# Patient Record
Sex: Female | Born: 1950
Health system: Southern US, Community
[De-identification: ages and names within clinical notes are randomized; demographics above are authoritative.]

## PROBLEM LIST (undated history)

## (undated) DIAGNOSIS — Z9221 Personal history of antineoplastic chemotherapy: Secondary | ICD-10-CM

## (undated) DIAGNOSIS — M1990 Unspecified osteoarthritis, unspecified site: Secondary | ICD-10-CM

## (undated) DIAGNOSIS — R5383 Other fatigue: Principal | ICD-10-CM

## (undated) DIAGNOSIS — R0602 Shortness of breath: Secondary | ICD-10-CM

## (undated) DIAGNOSIS — F329 Major depressive disorder, single episode, unspecified: Secondary | ICD-10-CM

## (undated) DIAGNOSIS — N959 Unspecified menopausal and perimenopausal disorder: Secondary | ICD-10-CM

## (undated) DIAGNOSIS — J90 Pleural effusion, not elsewhere classified: Secondary | ICD-10-CM

## (undated) DIAGNOSIS — H01006 Unspecified blepharitis left eye, unspecified eyelid: Secondary | ICD-10-CM

## (undated) DIAGNOSIS — I10 Essential (primary) hypertension: Secondary | ICD-10-CM

## (undated) DIAGNOSIS — H01003 Unspecified blepharitis right eye, unspecified eyelid: Secondary | ICD-10-CM

## (undated) DIAGNOSIS — K219 Gastro-esophageal reflux disease without esophagitis: Secondary | ICD-10-CM

## (undated) DIAGNOSIS — C7931 Secondary malignant neoplasm of brain: Secondary | ICD-10-CM

## (undated) DIAGNOSIS — E7800 Pure hypercholesterolemia, unspecified: Secondary | ICD-10-CM

## (undated) DIAGNOSIS — Z820 Family history of epilepsy and other diseases of the nervous system: Secondary | ICD-10-CM

## (undated) DIAGNOSIS — H538 Other visual disturbances: Secondary | ICD-10-CM

## (undated) DIAGNOSIS — D6481 Anemia due to antineoplastic chemotherapy: Secondary | ICD-10-CM

## (undated) DIAGNOSIS — Z842 Family history of other diseases of the genitourinary system: Secondary | ICD-10-CM

## (undated) DIAGNOSIS — Z923 Personal history of irradiation: Secondary | ICD-10-CM

## (undated) DIAGNOSIS — J9621 Acute and chronic respiratory failure with hypoxia: Secondary | ICD-10-CM

## (undated) DIAGNOSIS — E876 Hypokalemia: Secondary | ICD-10-CM

## (undated) DIAGNOSIS — Z780 Asymptomatic menopausal state: Secondary | ICD-10-CM

## (undated) DIAGNOSIS — R911 Solitary pulmonary nodule: Secondary | ICD-10-CM

## (undated) DIAGNOSIS — E440 Moderate protein-calorie malnutrition: Secondary | ICD-10-CM

## (undated) DIAGNOSIS — R0600 Dyspnea, unspecified: Secondary | ICD-10-CM

## (undated) DIAGNOSIS — Z5111 Encounter for antineoplastic chemotherapy: Secondary | ICD-10-CM

## (undated) DIAGNOSIS — I671 Cerebral aneurysm, nonruptured: Secondary | ICD-10-CM

## (undated) DIAGNOSIS — J910 Malignant pleural effusion: Secondary | ICD-10-CM

## (undated) DIAGNOSIS — I517 Cardiomegaly: Secondary | ICD-10-CM

## (undated) DIAGNOSIS — C189 Malignant neoplasm of colon, unspecified: Secondary | ICD-10-CM

## (undated) DIAGNOSIS — F32A Depression, unspecified: Secondary | ICD-10-CM

## (undated) DIAGNOSIS — E559 Vitamin D deficiency, unspecified: Principal | ICD-10-CM

## (undated) DIAGNOSIS — D630 Anemia in neoplastic disease: Secondary | ICD-10-CM

## (undated) DIAGNOSIS — H8110 Benign paroxysmal vertigo, unspecified ear: Secondary | ICD-10-CM

## (undated) DIAGNOSIS — L739 Follicular disorder, unspecified: Secondary | ICD-10-CM

## (undated) DIAGNOSIS — M5442 Lumbago with sciatica, left side: Secondary | ICD-10-CM

## (undated) DIAGNOSIS — I2510 Atherosclerotic heart disease of native coronary artery without angina pectoris: Secondary | ICD-10-CM

## (undated) DIAGNOSIS — G4700 Insomnia, unspecified: Secondary | ICD-10-CM

## (undated) DIAGNOSIS — Z5181 Encounter for therapeutic drug level monitoring: Secondary | ICD-10-CM

## (undated) DIAGNOSIS — R269 Unspecified abnormalities of gait and mobility: Secondary | ICD-10-CM

## (undated) DIAGNOSIS — H9193 Unspecified hearing loss, bilateral: Secondary | ICD-10-CM

## (undated) DIAGNOSIS — E785 Hyperlipidemia, unspecified: Secondary | ICD-10-CM

## (undated) DIAGNOSIS — Z85038 Personal history of other malignant neoplasm of large intestine: Secondary | ICD-10-CM

## (undated) DIAGNOSIS — T451X5A Adverse effect of antineoplastic and immunosuppressive drugs, initial encounter: Secondary | ICD-10-CM

## (undated) DIAGNOSIS — N6019 Diffuse cystic mastopathy of unspecified breast: Secondary | ICD-10-CM

## (undated) DIAGNOSIS — F529 Unspecified sexual dysfunction not due to a substance or known physiological condition: Secondary | ICD-10-CM

## (undated) DIAGNOSIS — R600 Localized edema: Secondary | ICD-10-CM

## (undated) DIAGNOSIS — M549 Dorsalgia, unspecified: Secondary | ICD-10-CM

## (undated) DIAGNOSIS — R21 Rash and other nonspecific skin eruption: Secondary | ICD-10-CM

## (undated) DIAGNOSIS — J449 Chronic obstructive pulmonary disease, unspecified: Secondary | ICD-10-CM

## (undated) DIAGNOSIS — F17200 Nicotine dependence, unspecified, uncomplicated: Secondary | ICD-10-CM

## (undated) DIAGNOSIS — L309 Dermatitis, unspecified: Secondary | ICD-10-CM

## (undated) DIAGNOSIS — F330 Major depressive disorder, recurrent, mild: Secondary | ICD-10-CM

## (undated) DIAGNOSIS — IMO0002 Reserved for concepts with insufficient information to code with codable children: Secondary | ICD-10-CM

## (undated) DIAGNOSIS — R296 Repeated falls: Secondary | ICD-10-CM

## (undated) DIAGNOSIS — H903 Sensorineural hearing loss, bilateral: Secondary | ICD-10-CM

## (undated) DIAGNOSIS — C3490 Malignant neoplasm of unspecified part of unspecified bronchus or lung: Secondary | ICD-10-CM

## (undated) DIAGNOSIS — Z79899 Other long term (current) drug therapy: Secondary | ICD-10-CM

## (undated) DIAGNOSIS — R42 Dizziness and giddiness: Secondary | ICD-10-CM

## (undated) DIAGNOSIS — M5440 Lumbago with sciatica, unspecified side: Secondary | ICD-10-CM

## (undated) DIAGNOSIS — R634 Abnormal weight loss: Secondary | ICD-10-CM

## (undated) DIAGNOSIS — Z7969 Long term (current) use of other immunomodulators and immunosuppressants: Secondary | ICD-10-CM

## (undated) DIAGNOSIS — I209 Angina pectoris, unspecified: Secondary | ICD-10-CM

## (undated) HISTORY — DX: Asymptomatic menopausal state: Z78.0

## (undated) HISTORY — PX: OTHER SURGICAL HISTORY: SHX169

## (undated) HISTORY — DX: Diffuse cystic mastopathy of unspecified breast: N60.19

## (undated) HISTORY — DX: Unspecified blepharitis left eye, unspecified eyelid: H01.006

## (undated) HISTORY — DX: Acute and chronic respiratory failure with hypoxia: J96.21

## (undated) HISTORY — DX: Other long term (current) drug therapy: Z79.899

## (undated) HISTORY — DX: Essential (primary) hypertension: I10

## (undated) HISTORY — DX: Abnormal weight loss: R63.4

## (undated) HISTORY — DX: Vitamin D deficiency, unspecified: E55.9

## (undated) HISTORY — DX: Angina pectoris, unspecified: I20.9

## (undated) HISTORY — DX: Reserved for concepts with insufficient information to code with codable children: IMO0002

## (undated) HISTORY — DX: Adverse effect of antineoplastic and immunosuppressive drugs, initial encounter: T45.1X5A

## (undated) HISTORY — DX: Chronic obstructive pulmonary disease, unspecified: J44.9

## (undated) HISTORY — DX: Hyperlipidemia, unspecified: E78.5

## (undated) HISTORY — PX: BREAST SURGERY: SHX581

## (undated) HISTORY — DX: Major depressive disorder, recurrent, mild: F33.0

## (undated) HISTORY — DX: Gastro-esophageal reflux disease without esophagitis: K21.9

## (undated) HISTORY — DX: Dermatitis, unspecified: L30.9

## (undated) HISTORY — DX: Anemia due to antineoplastic chemotherapy: D64.81

## (undated) HISTORY — DX: Atherosclerotic heart disease of native coronary artery without angina pectoris: I25.10

## (undated) HISTORY — DX: Sensorineural hearing loss, bilateral: H90.3

## (undated) HISTORY — DX: Encounter for therapeutic drug level monitoring: Z51.81

## (undated) HISTORY — PX: BACK SURGERY: SHX140

## (undated) HISTORY — PX: HERNIA REPAIR: SHX51

## (undated) HISTORY — DX: Lumbago with sciatica, unspecified side: M54.40

## (undated) HISTORY — DX: Dorsalgia, unspecified: M54.9

## (undated) HISTORY — DX: Major depressive disorder, single episode, unspecified: F32.9

## (undated) HISTORY — DX: Pleural effusion, not elsewhere classified: J90

## (undated) HISTORY — DX: Other fatigue: R53.83

## (undated) HISTORY — DX: Insomnia, unspecified: G47.00

## (undated) HISTORY — DX: Personal history of irradiation: Z92.3

## (undated) HISTORY — DX: Unspecified hearing loss, bilateral: H91.93

## (undated) HISTORY — DX: Unspecified abnormalities of gait and mobility: R26.9

## (undated) HISTORY — DX: Malignant neoplasm of unspecified part of unspecified bronchus or lung: C34.90

## (undated) HISTORY — DX: Other visual disturbances: H53.8

## (undated) HISTORY — DX: Encounter for antineoplastic chemotherapy: Z51.11

## (undated) HISTORY — PX: TUBAL LIGATION: SHX77

## (undated) HISTORY — DX: Depression, unspecified: F32.A

## (undated) HISTORY — DX: Hypokalemia: E87.6

## (undated) HISTORY — DX: Follicular disorder, unspecified: L73.9

## (undated) HISTORY — DX: Family history of epilepsy and other diseases of the nervous system: Z82.0

## (undated) HISTORY — DX: Moderate protein-calorie malnutrition: E44.0

## (undated) HISTORY — DX: Long term (current) use of other immunomodulators and immunosuppressants: Z79.69

## (undated) HISTORY — DX: Cerebral aneurysm, nonruptured: I67.1

## (undated) HISTORY — DX: Family history of other diseases of the genitourinary system: Z84.2

## (undated) HISTORY — DX: Pure hypercholesterolemia, unspecified: E78.00

## (undated) HISTORY — DX: Rash and other nonspecific skin eruption: R21

## (undated) HISTORY — DX: Dizziness and giddiness: R42

## (undated) HISTORY — DX: Malignant pleural effusion: J91.0

## (undated) HISTORY — DX: Repeated falls: R29.6

## (undated) HISTORY — DX: Cardiomegaly: I51.7

## (undated) HISTORY — DX: Unspecified menopausal and perimenopausal disorder: N95.9

## (undated) HISTORY — DX: Lumbago with sciatica, left side: M54.42

## (undated) HISTORY — DX: Solitary pulmonary nodule: R91.1

## (undated) HISTORY — DX: Personal history of other malignant neoplasm of large intestine: Z85.038

## (undated) HISTORY — DX: Nicotine dependence, unspecified, uncomplicated: F17.200

## (undated) HISTORY — DX: Benign paroxysmal vertigo, unspecified ear: H81.10

## (undated) HISTORY — DX: Unspecified blepharitis right eye, unspecified eyelid: H01.003

## (undated) HISTORY — DX: Malignant neoplasm of colon, unspecified: C18.9

## (undated) HISTORY — DX: Unspecified sexual dysfunction not due to a substance or known physiological condition: F52.9

## (undated) HISTORY — DX: Anemia in neoplastic disease: D63.0

## (undated) HISTORY — DX: Dyspnea, unspecified: R06.00

## (undated) HISTORY — DX: Localized edema: R60.0

## (undated) HISTORY — PX: TUNNELED VENOUS CATHETER PLACEMENT: SHX818

---

## 1997-09-17 ENCOUNTER — Encounter: Admission: RE | Admit: 1997-09-17 | Discharge: 1997-09-17 | Payer: Self-pay | Admitting: Family Medicine

## 1997-09-27 ENCOUNTER — Encounter: Admission: RE | Admit: 1997-09-27 | Discharge: 1997-09-27 | Payer: Self-pay | Admitting: Family Medicine

## 1997-10-23 ENCOUNTER — Inpatient Hospital Stay (HOSPITAL_COMMUNITY): Admission: EM | Admit: 1997-10-23 | Discharge: 1997-10-24 | Payer: Self-pay | Admitting: Emergency Medicine

## 1997-10-23 ENCOUNTER — Encounter: Admission: RE | Admit: 1997-10-23 | Discharge: 1997-10-23 | Payer: Self-pay | Admitting: Family Medicine

## 1997-10-29 ENCOUNTER — Encounter: Admission: RE | Admit: 1997-10-29 | Discharge: 1997-10-29 | Payer: Self-pay | Admitting: Family Medicine

## 1997-11-06 ENCOUNTER — Encounter: Admission: RE | Admit: 1997-11-06 | Discharge: 1997-11-06 | Payer: Self-pay | Admitting: Family Medicine

## 1997-11-07 ENCOUNTER — Ambulatory Visit (HOSPITAL_COMMUNITY): Admission: RE | Admit: 1997-11-07 | Discharge: 1997-11-07 | Payer: Self-pay | Admitting: Cardiology

## 1997-11-08 ENCOUNTER — Ambulatory Visit (HOSPITAL_COMMUNITY): Admission: RE | Admit: 1997-11-08 | Discharge: 1997-11-08 | Payer: Self-pay | Admitting: Cardiology

## 1997-11-12 ENCOUNTER — Emergency Department (HOSPITAL_COMMUNITY): Admission: EM | Admit: 1997-11-12 | Discharge: 1997-11-12 | Payer: Self-pay | Admitting: Emergency Medicine

## 1997-11-15 ENCOUNTER — Encounter: Admission: RE | Admit: 1997-11-15 | Discharge: 1997-11-15 | Payer: Self-pay | Admitting: Family Medicine

## 1998-02-20 ENCOUNTER — Encounter: Admission: RE | Admit: 1998-02-20 | Discharge: 1998-02-20 | Payer: Self-pay | Admitting: Family Medicine

## 1998-02-21 ENCOUNTER — Encounter: Admission: RE | Admit: 1998-02-21 | Discharge: 1998-02-21 | Payer: Self-pay | Admitting: Family Medicine

## 1998-03-13 ENCOUNTER — Encounter: Admission: RE | Admit: 1998-03-13 | Discharge: 1998-03-13 | Payer: Self-pay | Admitting: Family Medicine

## 1998-04-24 ENCOUNTER — Encounter: Admission: RE | Admit: 1998-04-24 | Discharge: 1998-04-24 | Payer: Self-pay | Admitting: Family Medicine

## 1998-05-03 ENCOUNTER — Encounter: Admission: RE | Admit: 1998-05-03 | Discharge: 1998-05-03 | Payer: Self-pay | Admitting: Family Medicine

## 1998-05-13 ENCOUNTER — Encounter: Admission: RE | Admit: 1998-05-13 | Discharge: 1998-05-13 | Payer: Self-pay | Admitting: Family Medicine

## 1998-07-04 ENCOUNTER — Encounter: Admission: RE | Admit: 1998-07-04 | Discharge: 1998-07-04 | Payer: Self-pay | Admitting: Family Medicine

## 1998-09-20 ENCOUNTER — Encounter: Admission: RE | Admit: 1998-09-20 | Discharge: 1998-09-20 | Payer: Self-pay | Admitting: Family Medicine

## 1998-10-04 ENCOUNTER — Encounter: Admission: RE | Admit: 1998-10-04 | Discharge: 1998-10-04 | Payer: Self-pay | Admitting: Family Medicine

## 1998-11-04 ENCOUNTER — Encounter: Admission: RE | Admit: 1998-11-04 | Discharge: 1998-11-04 | Payer: Self-pay | Admitting: Family Medicine

## 1999-05-09 ENCOUNTER — Emergency Department (HOSPITAL_COMMUNITY): Admission: EM | Admit: 1999-05-09 | Discharge: 1999-05-09 | Payer: Self-pay | Admitting: Emergency Medicine

## 1999-05-09 ENCOUNTER — Encounter: Payer: Self-pay | Admitting: Emergency Medicine

## 1999-05-27 ENCOUNTER — Encounter: Admission: RE | Admit: 1999-05-27 | Discharge: 1999-05-27 | Payer: Self-pay | Admitting: Sports Medicine

## 1999-06-04 ENCOUNTER — Encounter: Admission: RE | Admit: 1999-06-04 | Discharge: 1999-06-04 | Payer: Self-pay | Admitting: Family Medicine

## 1999-09-16 ENCOUNTER — Encounter: Admission: RE | Admit: 1999-09-16 | Discharge: 1999-09-16 | Payer: Self-pay | Admitting: Family Medicine

## 1999-09-30 ENCOUNTER — Encounter: Admission: RE | Admit: 1999-09-30 | Discharge: 1999-09-30 | Payer: Self-pay | Admitting: Sports Medicine

## 1999-09-30 ENCOUNTER — Encounter: Payer: Self-pay | Admitting: Sports Medicine

## 1999-11-21 ENCOUNTER — Encounter: Admission: RE | Admit: 1999-11-21 | Discharge: 1999-11-21 | Payer: Self-pay | Admitting: Family Medicine

## 1999-12-22 ENCOUNTER — Encounter: Admission: RE | Admit: 1999-12-22 | Discharge: 1999-12-22 | Payer: Self-pay | Admitting: Family Medicine

## 2000-03-23 ENCOUNTER — Encounter: Payer: Self-pay | Admitting: Emergency Medicine

## 2000-03-23 ENCOUNTER — Emergency Department (HOSPITAL_COMMUNITY): Admission: EM | Admit: 2000-03-23 | Discharge: 2000-03-23 | Payer: Self-pay | Admitting: Emergency Medicine

## 2000-04-26 ENCOUNTER — Ambulatory Visit (HOSPITAL_COMMUNITY): Admission: RE | Admit: 2000-04-26 | Discharge: 2000-04-26 | Payer: Self-pay | Admitting: Cardiology

## 2000-06-11 ENCOUNTER — Encounter: Admission: RE | Admit: 2000-06-11 | Discharge: 2000-06-11 | Payer: Self-pay | Admitting: Family Medicine

## 2000-06-16 ENCOUNTER — Encounter: Admission: RE | Admit: 2000-06-16 | Discharge: 2000-06-16 | Payer: Self-pay | Admitting: Family Medicine

## 2000-07-09 ENCOUNTER — Encounter: Admission: RE | Admit: 2000-07-09 | Discharge: 2000-07-09 | Payer: Self-pay | Admitting: Family Medicine

## 2000-09-15 ENCOUNTER — Encounter: Admission: RE | Admit: 2000-09-15 | Discharge: 2000-09-15 | Payer: Self-pay | Admitting: Family Medicine

## 2000-09-29 ENCOUNTER — Encounter: Admission: RE | Admit: 2000-09-29 | Discharge: 2000-09-29 | Payer: Self-pay | Admitting: Family Medicine

## 2000-11-11 ENCOUNTER — Emergency Department (HOSPITAL_COMMUNITY): Admission: EM | Admit: 2000-11-11 | Discharge: 2000-11-11 | Payer: Self-pay | Admitting: Emergency Medicine

## 2000-11-15 ENCOUNTER — Encounter: Admission: RE | Admit: 2000-11-15 | Discharge: 2000-11-15 | Payer: Self-pay | Admitting: Family Medicine

## 2000-11-22 ENCOUNTER — Encounter: Admission: RE | Admit: 2000-11-22 | Discharge: 2000-11-22 | Payer: Self-pay | Admitting: Sports Medicine

## 2001-05-12 ENCOUNTER — Encounter: Admission: RE | Admit: 2001-05-12 | Discharge: 2001-05-12 | Payer: Self-pay | Admitting: *Deleted

## 2001-05-12 ENCOUNTER — Encounter: Admission: RE | Admit: 2001-05-12 | Discharge: 2001-05-12 | Payer: Self-pay | Admitting: Family Medicine

## 2001-06-01 ENCOUNTER — Encounter: Admission: RE | Admit: 2001-06-01 | Discharge: 2001-06-01 | Payer: Self-pay | Admitting: Family Medicine

## 2001-09-12 ENCOUNTER — Encounter: Admission: RE | Admit: 2001-09-12 | Discharge: 2001-09-12 | Payer: Self-pay | Admitting: Sports Medicine

## 2001-10-06 ENCOUNTER — Encounter: Admission: RE | Admit: 2001-10-06 | Discharge: 2001-10-06 | Payer: Self-pay | Admitting: Family Medicine

## 2001-12-19 ENCOUNTER — Encounter: Admission: RE | Admit: 2001-12-19 | Discharge: 2001-12-19 | Payer: Self-pay | Admitting: Family Medicine

## 2001-12-30 ENCOUNTER — Encounter: Admission: RE | Admit: 2001-12-30 | Discharge: 2001-12-30 | Payer: Self-pay | Admitting: Family Medicine

## 2002-01-02 ENCOUNTER — Encounter: Admission: RE | Admit: 2002-01-02 | Discharge: 2002-01-02 | Payer: Self-pay | Admitting: Family Medicine

## 2002-08-29 ENCOUNTER — Emergency Department (HOSPITAL_COMMUNITY): Admission: EM | Admit: 2002-08-29 | Discharge: 2002-08-29 | Payer: Self-pay

## 2002-09-01 ENCOUNTER — Encounter: Admission: RE | Admit: 2002-09-01 | Discharge: 2002-09-01 | Payer: Self-pay | Admitting: Family Medicine

## 2002-09-07 ENCOUNTER — Encounter: Admission: RE | Admit: 2002-09-07 | Discharge: 2002-09-07 | Payer: Self-pay | Admitting: Family Medicine

## 2003-06-15 ENCOUNTER — Encounter: Admission: RE | Admit: 2003-06-15 | Discharge: 2003-06-15 | Payer: Self-pay | Admitting: Sports Medicine

## 2003-06-18 ENCOUNTER — Ambulatory Visit (HOSPITAL_COMMUNITY): Admission: RE | Admit: 2003-06-18 | Discharge: 2003-06-18 | Payer: Self-pay | Admitting: Sports Medicine

## 2003-06-28 ENCOUNTER — Encounter: Admission: RE | Admit: 2003-06-28 | Discharge: 2003-06-28 | Payer: Self-pay | Admitting: Sports Medicine

## 2003-07-05 ENCOUNTER — Encounter: Admission: RE | Admit: 2003-07-05 | Discharge: 2003-07-05 | Payer: Self-pay | Admitting: Sports Medicine

## 2003-07-11 ENCOUNTER — Encounter: Admission: RE | Admit: 2003-07-11 | Discharge: 2003-07-11 | Payer: Self-pay | Admitting: Family Medicine

## 2003-07-11 ENCOUNTER — Ambulatory Visit (HOSPITAL_COMMUNITY): Admission: RE | Admit: 2003-07-11 | Discharge: 2003-07-11 | Payer: Self-pay | Admitting: Sports Medicine

## 2003-07-31 ENCOUNTER — Encounter: Admission: RE | Admit: 2003-07-31 | Discharge: 2003-07-31 | Payer: Self-pay | Admitting: Family Medicine

## 2004-07-10 ENCOUNTER — Ambulatory Visit: Payer: Self-pay | Admitting: Cardiology

## 2004-07-10 ENCOUNTER — Inpatient Hospital Stay (HOSPITAL_COMMUNITY): Admission: EM | Admit: 2004-07-10 | Discharge: 2004-07-12 | Payer: Self-pay | Admitting: Emergency Medicine

## 2004-08-08 ENCOUNTER — Ambulatory Visit: Payer: Self-pay | Admitting: Cardiology

## 2004-10-15 ENCOUNTER — Ambulatory Visit (HOSPITAL_COMMUNITY): Admission: RE | Admit: 2004-10-15 | Discharge: 2004-10-15 | Payer: Self-pay | Admitting: Neurology

## 2005-01-26 ENCOUNTER — Ambulatory Visit: Payer: Self-pay | Admitting: Sports Medicine

## 2005-02-06 ENCOUNTER — Ambulatory Visit (HOSPITAL_COMMUNITY): Admission: RE | Admit: 2005-02-06 | Discharge: 2005-02-06 | Payer: Self-pay | Admitting: Family Medicine

## 2005-04-17 ENCOUNTER — Encounter (INDEPENDENT_AMBULATORY_CARE_PROVIDER_SITE_OTHER): Payer: Self-pay | Admitting: *Deleted

## 2005-05-06 ENCOUNTER — Ambulatory Visit: Payer: Self-pay | Admitting: Family Medicine

## 2005-05-08 ENCOUNTER — Ambulatory Visit: Payer: Self-pay | Admitting: Family Medicine

## 2005-05-18 DIAGNOSIS — Z842 Family history of other diseases of the genitourinary system: Secondary | ICD-10-CM

## 2005-05-18 DIAGNOSIS — Z831 Family history of other infectious and parasitic diseases: Secondary | ICD-10-CM

## 2005-05-18 HISTORY — DX: Family history of other infectious and parasitic diseases: Z83.1

## 2005-05-18 HISTORY — DX: Family history of other diseases of the genitourinary system: Z84.2

## 2005-06-11 ENCOUNTER — Ambulatory Visit: Payer: Self-pay | Admitting: Family Medicine

## 2005-07-27 ENCOUNTER — Ambulatory Visit: Payer: Self-pay | Admitting: Sports Medicine

## 2005-07-29 ENCOUNTER — Ambulatory Visit: Payer: Self-pay | Admitting: Family Medicine

## 2005-08-26 ENCOUNTER — Ambulatory Visit: Payer: Self-pay | Admitting: Family Medicine

## 2005-08-31 ENCOUNTER — Ambulatory Visit: Payer: Self-pay | Admitting: Family Medicine

## 2005-09-03 ENCOUNTER — Ambulatory Visit (HOSPITAL_COMMUNITY): Admission: RE | Admit: 2005-09-03 | Discharge: 2005-09-03 | Payer: Self-pay | Admitting: Sports Medicine

## 2005-10-01 ENCOUNTER — Ambulatory Visit: Payer: Self-pay | Admitting: Family Medicine

## 2005-10-27 ENCOUNTER — Ambulatory Visit: Payer: Self-pay | Admitting: Family Medicine

## 2006-01-20 ENCOUNTER — Emergency Department (HOSPITAL_COMMUNITY): Admission: EM | Admit: 2006-01-20 | Discharge: 2006-01-20 | Payer: Self-pay | Admitting: Emergency Medicine

## 2006-01-21 ENCOUNTER — Ambulatory Visit: Payer: Self-pay | Admitting: Family Medicine

## 2006-02-10 ENCOUNTER — Ambulatory Visit: Payer: Self-pay | Admitting: Family Medicine

## 2006-04-15 ENCOUNTER — Ambulatory Visit: Payer: Self-pay | Admitting: Family Medicine

## 2006-04-23 ENCOUNTER — Ambulatory Visit (HOSPITAL_COMMUNITY): Admission: RE | Admit: 2006-04-23 | Discharge: 2006-04-23 | Payer: Self-pay | Admitting: Sports Medicine

## 2006-05-10 ENCOUNTER — Encounter: Admission: RE | Admit: 2006-05-10 | Discharge: 2006-05-10 | Payer: Self-pay | Admitting: Sports Medicine

## 2006-05-19 ENCOUNTER — Ambulatory Visit: Payer: Self-pay | Admitting: Family Medicine

## 2006-06-02 ENCOUNTER — Ambulatory Visit: Payer: Self-pay | Admitting: Sports Medicine

## 2006-07-16 ENCOUNTER — Encounter (INDEPENDENT_AMBULATORY_CARE_PROVIDER_SITE_OTHER): Payer: Self-pay | Admitting: *Deleted

## 2006-08-16 ENCOUNTER — Ambulatory Visit: Payer: Self-pay | Admitting: Sports Medicine

## 2006-08-16 ENCOUNTER — Encounter (INDEPENDENT_AMBULATORY_CARE_PROVIDER_SITE_OTHER): Payer: Self-pay | Admitting: Family Medicine

## 2006-08-16 ENCOUNTER — Telehealth: Payer: Self-pay | Admitting: *Deleted

## 2006-08-16 LAB — CONVERTED CEMR LAB: Whiff Test: POSITIVE

## 2006-09-01 ENCOUNTER — Telehealth: Payer: Self-pay | Admitting: *Deleted

## 2006-09-16 ENCOUNTER — Ambulatory Visit: Payer: Self-pay | Admitting: Internal Medicine

## 2006-10-04 ENCOUNTER — Telehealth (INDEPENDENT_AMBULATORY_CARE_PROVIDER_SITE_OTHER): Payer: Self-pay | Admitting: Family Medicine

## 2006-11-03 ENCOUNTER — Telehealth (INDEPENDENT_AMBULATORY_CARE_PROVIDER_SITE_OTHER): Payer: Self-pay | Admitting: Family Medicine

## 2006-12-23 ENCOUNTER — Encounter: Payer: Self-pay | Admitting: *Deleted

## 2006-12-29 ENCOUNTER — Ambulatory Visit: Payer: Self-pay

## 2006-12-29 DIAGNOSIS — F330 Major depressive disorder, recurrent, mild: Secondary | ICD-10-CM | POA: Insufficient documentation

## 2006-12-29 DIAGNOSIS — I10 Essential (primary) hypertension: Secondary | ICD-10-CM

## 2006-12-29 DIAGNOSIS — K219 Gastro-esophageal reflux disease without esophagitis: Secondary | ICD-10-CM

## 2006-12-29 DIAGNOSIS — E7800 Pure hypercholesterolemia, unspecified: Secondary | ICD-10-CM

## 2006-12-29 DIAGNOSIS — I671 Cerebral aneurysm, nonruptured: Secondary | ICD-10-CM

## 2006-12-29 HISTORY — DX: Pure hypercholesterolemia, unspecified: E78.00

## 2006-12-29 HISTORY — DX: Major depressive disorder, recurrent, mild: F33.0

## 2006-12-29 HISTORY — DX: Cerebral aneurysm, nonruptured: I67.1

## 2006-12-29 HISTORY — DX: Gastro-esophageal reflux disease without esophagitis: K21.9

## 2006-12-29 HISTORY — DX: Essential (primary) hypertension: I10

## 2006-12-30 ENCOUNTER — Telehealth (INDEPENDENT_AMBULATORY_CARE_PROVIDER_SITE_OTHER): Payer: Self-pay | Admitting: Family Medicine

## 2006-12-31 ENCOUNTER — Encounter (INDEPENDENT_AMBULATORY_CARE_PROVIDER_SITE_OTHER): Payer: Self-pay | Admitting: Family Medicine

## 2007-01-12 ENCOUNTER — Emergency Department (HOSPITAL_COMMUNITY): Admission: EM | Admit: 2007-01-12 | Discharge: 2007-01-13 | Payer: Self-pay | Admitting: Emergency Medicine

## 2007-01-14 ENCOUNTER — Encounter (INDEPENDENT_AMBULATORY_CARE_PROVIDER_SITE_OTHER): Payer: Self-pay | Admitting: *Deleted

## 2007-01-14 ENCOUNTER — Encounter (INDEPENDENT_AMBULATORY_CARE_PROVIDER_SITE_OTHER): Payer: Self-pay | Admitting: Family Medicine

## 2007-01-14 ENCOUNTER — Encounter (INDEPENDENT_AMBULATORY_CARE_PROVIDER_SITE_OTHER): Payer: Self-pay | Admitting: Gastroenterology

## 2007-01-14 ENCOUNTER — Ambulatory Visit (HOSPITAL_COMMUNITY): Admission: RE | Admit: 2007-01-14 | Discharge: 2007-01-14 | Payer: Self-pay | Admitting: Gastroenterology

## 2007-01-26 ENCOUNTER — Ambulatory Visit: Payer: Self-pay | Admitting: Internal Medicine

## 2007-02-01 ENCOUNTER — Ambulatory Visit: Payer: Self-pay | Admitting: Cardiology

## 2007-02-03 ENCOUNTER — Encounter (INDEPENDENT_AMBULATORY_CARE_PROVIDER_SITE_OTHER): Payer: Self-pay | Admitting: Family Medicine

## 2007-02-03 ENCOUNTER — Ambulatory Visit: Payer: Self-pay

## 2007-03-07 ENCOUNTER — Encounter: Payer: Self-pay | Admitting: Family Medicine

## 2007-03-19 DIAGNOSIS — C189 Malignant neoplasm of colon, unspecified: Secondary | ICD-10-CM

## 2007-03-19 HISTORY — DX: Malignant neoplasm of colon, unspecified: C18.9

## 2007-03-22 ENCOUNTER — Telehealth: Payer: Self-pay | Admitting: *Deleted

## 2007-03-22 HISTORY — PX: COLECTOMY: SHX59

## 2007-03-25 ENCOUNTER — Encounter: Payer: Self-pay | Admitting: *Deleted

## 2007-03-28 ENCOUNTER — Encounter (INDEPENDENT_AMBULATORY_CARE_PROVIDER_SITE_OTHER): Payer: Self-pay | Admitting: *Deleted

## 2007-04-01 ENCOUNTER — Encounter (INDEPENDENT_AMBULATORY_CARE_PROVIDER_SITE_OTHER): Payer: Self-pay | Admitting: *Deleted

## 2007-04-01 ENCOUNTER — Encounter (INDEPENDENT_AMBULATORY_CARE_PROVIDER_SITE_OTHER): Payer: Self-pay | Admitting: General Surgery

## 2007-04-01 ENCOUNTER — Inpatient Hospital Stay (HOSPITAL_COMMUNITY): Admission: RE | Admit: 2007-04-01 | Discharge: 2007-04-10 | Payer: Self-pay | Admitting: General Surgery

## 2007-04-19 ENCOUNTER — Ambulatory Visit: Payer: Self-pay | Admitting: Hematology & Oncology

## 2007-04-28 ENCOUNTER — Ambulatory Visit: Payer: Self-pay | Admitting: Cardiology

## 2007-05-03 ENCOUNTER — Ambulatory Visit: Payer: Self-pay | Admitting: Sports Medicine

## 2007-05-09 ENCOUNTER — Encounter (INDEPENDENT_AMBULATORY_CARE_PROVIDER_SITE_OTHER): Payer: Self-pay | Admitting: Family Medicine

## 2007-05-13 ENCOUNTER — Telehealth: Payer: Self-pay | Admitting: *Deleted

## 2007-06-14 ENCOUNTER — Ambulatory Visit (HOSPITAL_COMMUNITY): Admission: RE | Admit: 2007-06-14 | Discharge: 2007-06-14 | Payer: Self-pay | Admitting: Hematology & Oncology

## 2007-06-15 ENCOUNTER — Ambulatory Visit: Payer: Self-pay | Admitting: Psychiatry

## 2007-06-21 ENCOUNTER — Ambulatory Visit: Payer: Self-pay | Admitting: Hematology & Oncology

## 2007-06-22 ENCOUNTER — Ambulatory Visit: Payer: Self-pay | Admitting: Psychiatry

## 2007-06-23 ENCOUNTER — Encounter (INDEPENDENT_AMBULATORY_CARE_PROVIDER_SITE_OTHER): Payer: Self-pay | Admitting: Family Medicine

## 2007-06-23 LAB — COMPREHENSIVE METABOLIC PANEL
ALT: <8 U/L (ref 0–35)
AST: 11 U/L (ref 0–37)
Alkaline Phosphatase: 107 U/L (ref 39–117)
BUN: 9 mg/dL (ref 6–23)
Calcium: 9.2 mg/dL (ref 8.4–10.5)
Creatinine, Ser: 0.79 mg/dL (ref 0.40–1.20)
Total Bilirubin: 0.8 mg/dL (ref 0.3–1.2)

## 2007-06-23 LAB — CBC WITH DIFFERENTIAL/PLATELET
BASO%: 0.4 % (ref 0.0–2.0)
Basophils Absolute: 0 10*3/uL (ref 0.0–0.1)
EOS%: 1.2 % (ref 0.0–7.0)
HCT: 44.5 % (ref 34.8–46.6)
HGB: 14.9 g/dL (ref 11.6–15.9)
LYMPH%: 28.7 % (ref 14.0–48.0)
MCH: 25.9 pg — ABNORMAL LOW (ref 26.0–34.0)
MCHC: 33.5 g/dL (ref 32.0–36.0)
MCV: 77.2 fL — ABNORMAL LOW (ref 81.0–101.0)
MONO%: 5.8 % (ref 0.0–13.0)
NEUT%: 63.9 % (ref 39.6–76.8)

## 2007-06-27 ENCOUNTER — Encounter (INDEPENDENT_AMBULATORY_CARE_PROVIDER_SITE_OTHER): Payer: Self-pay | Admitting: Family Medicine

## 2007-06-27 ENCOUNTER — Ambulatory Visit: Payer: Self-pay | Admitting: Sports Medicine

## 2007-06-27 LAB — CONVERTED CEMR LAB
ALT: <8 units/L (ref 0–35)
Albumin: 4.2 g/dL (ref 3.5–5.2)
CO2: 27 meq/L (ref 19–32)
Calcium: 9.5 mg/dL (ref 8.4–10.5)
Chloride: 106 meq/L (ref 96–112)
Creatinine, Ser: 0.76 mg/dL (ref 0.40–1.20)
GC Probe Amp, Genital: NEGATIVE
HCT: 44.9 % (ref 36.0–46.0)
Platelets: 271 10*3/uL (ref 150–400)
Sodium: 142 meq/L (ref 135–145)
Total Protein: 7.3 g/dL (ref 6.0–8.3)
WBC: 6.5 10*3/uL (ref 4.0–10.5)

## 2007-06-28 ENCOUNTER — Ambulatory Visit: Payer: Self-pay | Admitting: Psychiatry

## 2007-06-28 ENCOUNTER — Encounter (INDEPENDENT_AMBULATORY_CARE_PROVIDER_SITE_OTHER): Payer: Self-pay | Admitting: Family Medicine

## 2007-06-29 ENCOUNTER — Encounter (INDEPENDENT_AMBULATORY_CARE_PROVIDER_SITE_OTHER): Payer: Self-pay | Admitting: Family Medicine

## 2007-06-29 LAB — CONVERTED CEMR LAB: Pap Smear: NORMAL

## 2007-07-05 ENCOUNTER — Ambulatory Visit (HOSPITAL_COMMUNITY): Admission: RE | Admit: 2007-07-05 | Discharge: 2007-07-05 | Payer: Self-pay | Admitting: Family Medicine

## 2007-07-07 ENCOUNTER — Ambulatory Visit: Payer: Self-pay | Admitting: Psychiatry

## 2007-07-11 ENCOUNTER — Ambulatory Visit (HOSPITAL_COMMUNITY): Admission: RE | Admit: 2007-07-11 | Discharge: 2007-07-11 | Payer: Self-pay | Admitting: Neurology

## 2007-07-11 ENCOUNTER — Encounter: Payer: Self-pay | Admitting: Family Medicine

## 2007-07-25 ENCOUNTER — Ambulatory Visit: Payer: Self-pay | Admitting: Psychiatry

## 2007-08-08 ENCOUNTER — Ambulatory Visit: Payer: Self-pay | Admitting: Psychiatry

## 2007-08-25 ENCOUNTER — Ambulatory Visit: Payer: Self-pay | Admitting: Psychiatry

## 2007-09-08 ENCOUNTER — Ambulatory Visit: Payer: Self-pay | Admitting: Psychiatry

## 2007-09-09 ENCOUNTER — Encounter (INDEPENDENT_AMBULATORY_CARE_PROVIDER_SITE_OTHER): Payer: Self-pay | Admitting: Family Medicine

## 2007-09-09 ENCOUNTER — Ambulatory Visit: Payer: Self-pay | Admitting: Family Medicine

## 2007-09-09 DIAGNOSIS — I2510 Atherosclerotic heart disease of native coronary artery without angina pectoris: Secondary | ICD-10-CM

## 2007-09-09 DIAGNOSIS — Z8679 Personal history of other diseases of the circulatory system: Secondary | ICD-10-CM | POA: Insufficient documentation

## 2007-09-09 HISTORY — DX: Atherosclerotic heart disease of native coronary artery without angina pectoris: I25.10

## 2007-09-09 LAB — CONVERTED CEMR LAB
Protein, U semiquant: NEGATIVE
Urobilinogen, UA: 0.2
WBC Urine, dipstick: NEGATIVE

## 2007-09-12 ENCOUNTER — Ambulatory Visit (HOSPITAL_COMMUNITY): Admission: RE | Admit: 2007-09-12 | Discharge: 2007-09-12 | Payer: Self-pay | Admitting: Hematology & Oncology

## 2007-09-12 ENCOUNTER — Encounter (INDEPENDENT_AMBULATORY_CARE_PROVIDER_SITE_OTHER): Payer: Self-pay | Admitting: Family Medicine

## 2007-09-13 ENCOUNTER — Telehealth: Payer: Self-pay | Admitting: *Deleted

## 2007-09-20 ENCOUNTER — Ambulatory Visit: Payer: Self-pay | Admitting: Hematology & Oncology

## 2007-09-22 ENCOUNTER — Encounter (INDEPENDENT_AMBULATORY_CARE_PROVIDER_SITE_OTHER): Payer: Self-pay | Admitting: Family Medicine

## 2007-09-22 ENCOUNTER — Ambulatory Visit: Payer: Self-pay | Admitting: Psychiatry

## 2007-09-22 LAB — COMPREHENSIVE METABOLIC PANEL
ALT: 11 U/L (ref 0–35)
AST: 12 U/L (ref 0–37)
CO2: 26 mEq/L (ref 19–32)
Chloride: 105 mEq/L (ref 96–112)
Creatinine, Ser: 0.77 mg/dL (ref 0.40–1.20)
Sodium: 141 mEq/L (ref 135–145)
Total Bilirubin: 0.7 mg/dL (ref 0.3–1.2)
Total Protein: 6.9 g/dL (ref 6.0–8.3)

## 2007-09-22 LAB — CBC WITH DIFFERENTIAL/PLATELET
Eosinophils Absolute: 0.2 10*3/uL (ref 0.0–0.5)
HCT: 44.8 % (ref 34.8–46.6)
LYMPH%: 31.5 % (ref 14.0–48.0)
MCV: 77.2 fL — ABNORMAL LOW (ref 81.0–101.0)
MONO#: 0.5 10*3/uL (ref 0.1–0.9)
MONO%: 7.9 % (ref 0.0–13.0)
NEUT#: 4 10*3/uL (ref 1.5–6.5)
NEUT%: 57.9 % (ref 39.6–76.8)
Platelets: 222 10*3/uL (ref 145–400)
RBC: 5.8 10*6/uL — ABNORMAL HIGH (ref 3.70–5.32)
WBC: 6.9 10*3/uL (ref 3.9–10.0)

## 2007-09-22 LAB — CEA: CEA: 4.2 ng/mL (ref 0.0–5.0)

## 2007-10-03 ENCOUNTER — Ambulatory Visit: Payer: Self-pay | Admitting: Psychiatry

## 2007-10-04 ENCOUNTER — Ambulatory Visit: Payer: Self-pay | Admitting: Family Medicine

## 2007-10-04 DIAGNOSIS — F17200 Nicotine dependence, unspecified, uncomplicated: Secondary | ICD-10-CM

## 2007-10-13 ENCOUNTER — Ambulatory Visit: Payer: Self-pay | Admitting: Psychiatry

## 2007-10-26 ENCOUNTER — Ambulatory Visit: Payer: Self-pay | Admitting: Psychiatry

## 2007-11-03 ENCOUNTER — Ambulatory Visit: Payer: Self-pay | Admitting: Family Medicine

## 2007-11-08 ENCOUNTER — Ambulatory Visit: Payer: Self-pay | Admitting: Psychiatry

## 2007-11-30 ENCOUNTER — Ambulatory Visit: Payer: Self-pay | Admitting: Psychiatry

## 2007-11-30 ENCOUNTER — Ambulatory Visit: Payer: Self-pay | Admitting: Family Medicine

## 2007-11-30 ENCOUNTER — Ambulatory Visit (HOSPITAL_COMMUNITY): Admission: RE | Admit: 2007-11-30 | Discharge: 2007-11-30 | Payer: Self-pay | Admitting: Family Medicine

## 2007-12-08 ENCOUNTER — Ambulatory Visit: Payer: Self-pay | Admitting: Family Medicine

## 2007-12-08 ENCOUNTER — Encounter: Payer: Self-pay | Admitting: Family Medicine

## 2007-12-14 ENCOUNTER — Ambulatory Visit: Payer: Self-pay | Admitting: Psychiatry

## 2007-12-15 ENCOUNTER — Ambulatory Visit: Payer: Self-pay | Admitting: Family Medicine

## 2007-12-15 ENCOUNTER — Encounter: Payer: Self-pay | Admitting: Family Medicine

## 2007-12-15 ENCOUNTER — Ambulatory Visit: Payer: Self-pay | Admitting: Cardiology

## 2007-12-15 LAB — CONVERTED CEMR LAB
ALT: <8 units/L (ref 0–35)
Albumin: 4 g/dL (ref 3.5–5.2)
BUN: 15 mg/dL (ref 6–23)
CO2: 23 meq/L (ref 19–32)
Calcium: 8.7 mg/dL (ref 8.4–10.5)
Chloride: 108 meq/L (ref 96–112)
Creatinine, Ser: 0.75 mg/dL (ref 0.40–1.20)

## 2007-12-22 ENCOUNTER — Ambulatory Visit: Payer: Self-pay

## 2007-12-28 ENCOUNTER — Ambulatory Visit: Payer: Self-pay | Admitting: Psychiatry

## 2008-01-11 ENCOUNTER — Ambulatory Visit: Payer: Self-pay | Admitting: Psychiatry

## 2008-01-18 ENCOUNTER — Ambulatory Visit: Payer: Self-pay | Admitting: Hematology & Oncology

## 2008-01-20 LAB — COMPREHENSIVE METABOLIC PANEL
AST: 15 U/L (ref 0–37)
Albumin: 3.5 g/dL (ref 3.5–5.2)
Alkaline Phosphatase: 112 U/L (ref 39–117)
BUN: 14 mg/dL (ref 6–23)
Potassium: 4.1 mEq/L (ref 3.5–5.3)
Sodium: 142 mEq/L (ref 135–145)
Total Bilirubin: 0.6 mg/dL (ref 0.3–1.2)
Total Protein: 6.8 g/dL (ref 6.0–8.3)

## 2008-01-20 LAB — CBC WITH DIFFERENTIAL/PLATELET
EOS%: 2.1 % (ref 0.0–7.0)
LYMPH%: 34.4 % (ref 14.0–48.0)
MCH: 26.9 pg (ref 26.0–34.0)
MCHC: 34.1 g/dL (ref 32.0–36.0)
MCV: 78.9 fL — ABNORMAL LOW (ref 81.0–101.0)
MONO%: 4.1 % (ref 0.0–13.0)
Platelets: 220 10*3/uL (ref 145–400)
RBC: 5.45 10*6/uL — ABNORMAL HIGH (ref 3.70–5.32)
RDW: 15.1 % — ABNORMAL HIGH (ref 11.3–14.5)

## 2008-01-24 ENCOUNTER — Ambulatory Visit: Payer: Self-pay | Admitting: Psychiatry

## 2008-01-25 ENCOUNTER — Ambulatory Visit (HOSPITAL_COMMUNITY): Admission: RE | Admit: 2008-01-25 | Discharge: 2008-01-25 | Payer: Self-pay | Admitting: Hematology & Oncology

## 2008-01-30 ENCOUNTER — Encounter: Payer: Self-pay | Admitting: Family Medicine

## 2008-01-31 ENCOUNTER — Encounter: Payer: Self-pay | Admitting: Family Medicine

## 2008-01-31 ENCOUNTER — Ambulatory Visit: Payer: Self-pay | Admitting: Family Medicine

## 2008-01-31 LAB — CONVERTED CEMR LAB: TSH: 2.259 microintl units/mL (ref 0.350–4.50)

## 2008-02-06 ENCOUNTER — Encounter: Payer: Self-pay | Admitting: Family Medicine

## 2008-02-07 ENCOUNTER — Ambulatory Visit: Payer: Self-pay | Admitting: Psychiatry

## 2008-02-13 ENCOUNTER — Ambulatory Visit: Payer: Self-pay | Admitting: Family Medicine

## 2008-02-13 LAB — CONVERTED CEMR LAB
Bilirubin Urine: NEGATIVE
Ketones, urine, test strip: NEGATIVE
Nitrite: NEGATIVE
Specific Gravity, Urine: 1.02

## 2008-02-21 ENCOUNTER — Ambulatory Visit: Payer: Self-pay | Admitting: Psychiatry

## 2008-02-23 ENCOUNTER — Telehealth: Payer: Self-pay | Admitting: *Deleted

## 2008-02-27 ENCOUNTER — Ambulatory Visit: Payer: Self-pay | Admitting: Gastroenterology

## 2008-02-27 DIAGNOSIS — K5900 Constipation, unspecified: Secondary | ICD-10-CM | POA: Insufficient documentation

## 2008-02-29 ENCOUNTER — Encounter: Payer: Self-pay | Admitting: Gastroenterology

## 2008-02-29 ENCOUNTER — Ambulatory Visit: Payer: Self-pay | Admitting: Gastroenterology

## 2008-02-29 ENCOUNTER — Encounter (INDEPENDENT_AMBULATORY_CARE_PROVIDER_SITE_OTHER): Payer: Self-pay | Admitting: *Deleted

## 2008-03-01 ENCOUNTER — Encounter: Payer: Self-pay | Admitting: Gastroenterology

## 2008-03-07 ENCOUNTER — Ambulatory Visit: Payer: Self-pay | Admitting: Psychiatry

## 2008-04-05 ENCOUNTER — Telehealth: Payer: Self-pay | Admitting: Family Medicine

## 2008-04-09 ENCOUNTER — Telehealth: Payer: Self-pay | Admitting: Family Medicine

## 2008-04-10 ENCOUNTER — Ambulatory Visit: Payer: Self-pay | Admitting: Psychiatry

## 2008-05-02 ENCOUNTER — Ambulatory Visit: Payer: Self-pay | Admitting: Psychiatry

## 2008-05-07 ENCOUNTER — Ambulatory Visit: Payer: Self-pay | Admitting: Family Medicine

## 2008-05-07 ENCOUNTER — Encounter: Payer: Self-pay | Admitting: Family Medicine

## 2008-05-07 LAB — CONVERTED CEMR LAB
GC Probe Amp, Genital: NEGATIVE
HDL: 45 mg/dL (ref >39)
Total CHOL/HDL Ratio: 5
Triglycerides: 128 mg/dL (ref <150)

## 2008-05-08 ENCOUNTER — Encounter: Payer: Self-pay | Admitting: Family Medicine

## 2008-05-21 ENCOUNTER — Ambulatory Visit: Payer: Self-pay | Admitting: Family Medicine

## 2008-05-21 ENCOUNTER — Telehealth: Payer: Self-pay | Admitting: *Deleted

## 2008-05-24 ENCOUNTER — Ambulatory Visit: Payer: Self-pay | Admitting: Internal Medicine

## 2008-05-28 ENCOUNTER — Telehealth (INDEPENDENT_AMBULATORY_CARE_PROVIDER_SITE_OTHER): Payer: Self-pay | Admitting: *Deleted

## 2008-05-29 ENCOUNTER — Ambulatory Visit: Payer: Self-pay | Admitting: Hematology & Oncology

## 2008-05-31 ENCOUNTER — Ambulatory Visit: Payer: Self-pay | Admitting: Psychiatry

## 2008-05-31 ENCOUNTER — Telehealth (INDEPENDENT_AMBULATORY_CARE_PROVIDER_SITE_OTHER): Payer: Self-pay | Admitting: *Deleted

## 2008-05-31 LAB — COMPREHENSIVE METABOLIC PANEL
AST: 9 U/L (ref 0–37)
Albumin: 3.8 g/dL (ref 3.5–5.2)
Alkaline Phosphatase: 126 U/L — ABNORMAL HIGH (ref 39–117)
BUN: 13 mg/dL (ref 6–23)
Calcium: 8.8 mg/dL (ref 8.4–10.5)
Chloride: 104 mEq/L (ref 96–112)
Creatinine, Ser: 0.79 mg/dL (ref 0.40–1.20)
Glucose, Bld: 96 mg/dL (ref 70–99)

## 2008-05-31 LAB — CBC WITH DIFFERENTIAL/PLATELET
Basophils Absolute: 0 10*3/uL (ref 0.0–0.1)
EOS%: 2.4 % (ref 0.0–7.0)
Eosinophils Absolute: 0.2 10*3/uL (ref 0.0–0.5)
HCT: 42.8 % (ref 34.8–46.6)
HGB: 14 g/dL (ref 11.6–15.9)
MCH: 25.8 pg — ABNORMAL LOW (ref 26.0–34.0)
MCV: 78.7 fL — ABNORMAL LOW (ref 81.0–101.0)
MONO%: 7.7 % (ref 0.0–13.0)
NEUT#: 5.2 10*3/uL (ref 1.5–6.5)
NEUT%: 63 % (ref 39.6–76.8)
Platelets: 250 10*3/uL (ref 145–400)

## 2008-06-05 ENCOUNTER — Ambulatory Visit (HOSPITAL_COMMUNITY): Admission: RE | Admit: 2008-06-05 | Discharge: 2008-06-05 | Payer: Self-pay | Admitting: Oncology

## 2008-06-07 ENCOUNTER — Encounter: Payer: Self-pay | Admitting: Family Medicine

## 2008-07-03 ENCOUNTER — Ambulatory Visit: Payer: Self-pay | Admitting: Psychiatry

## 2008-08-27 ENCOUNTER — Emergency Department (HOSPITAL_COMMUNITY): Admission: EM | Admit: 2008-08-27 | Discharge: 2008-08-27 | Payer: Self-pay | Admitting: Emergency Medicine

## 2008-09-13 ENCOUNTER — Ambulatory Visit: Payer: Self-pay | Admitting: Cardiology

## 2008-09-24 ENCOUNTER — Ambulatory Visit: Payer: Self-pay | Admitting: Family Medicine

## 2008-10-08 ENCOUNTER — Ambulatory Visit: Payer: Self-pay | Admitting: Hematology & Oncology

## 2008-11-02 ENCOUNTER — Encounter: Payer: Self-pay | Admitting: Family Medicine

## 2008-11-02 LAB — COMPREHENSIVE METABOLIC PANEL
ALT: 8 U/L (ref 0–35)
AST: 10 U/L (ref 0–37)
Alkaline Phosphatase: 137 U/L — ABNORMAL HIGH (ref 39–117)
BUN: 12 mg/dL (ref 6–23)
Chloride: 105 mEq/L (ref 96–112)
Creatinine, Ser: 0.86 mg/dL (ref 0.40–1.20)
Total Bilirubin: 0.4 mg/dL (ref 0.3–1.2)

## 2008-11-02 LAB — CBC WITH DIFFERENTIAL/PLATELET
BASO%: 0.3 % (ref 0.0–2.0)
EOS%: 1.7 % (ref 0.0–7.0)
HCT: 44.2 % (ref 34.8–46.6)
LYMPH%: 25.4 % (ref 14.0–49.7)
MCH: 26.5 pg (ref 25.1–34.0)
MCHC: 33.7 g/dL (ref 31.5–36.0)
MCV: 78.5 fL — ABNORMAL LOW (ref 79.5–101.0)
MONO%: 6.2 % (ref 0.0–14.0)
NEUT%: 66.4 % (ref 38.4–76.8)
lymph#: 2 10*3/uL (ref 0.9–3.3)

## 2008-12-14 ENCOUNTER — Ambulatory Visit: Payer: Self-pay | Admitting: Gastroenterology

## 2008-12-17 ENCOUNTER — Encounter: Payer: Self-pay | Admitting: Gastroenterology

## 2009-01-29 ENCOUNTER — Ambulatory Visit: Payer: Self-pay | Admitting: Gastroenterology

## 2009-01-31 ENCOUNTER — Ambulatory Visit: Payer: Self-pay | Admitting: Family Medicine

## 2009-01-31 ENCOUNTER — Encounter: Payer: Self-pay | Admitting: Family Medicine

## 2009-01-31 DIAGNOSIS — N959 Unspecified menopausal and perimenopausal disorder: Secondary | ICD-10-CM

## 2009-01-31 HISTORY — DX: Unspecified menopausal and perimenopausal disorder: N95.9

## 2009-02-01 LAB — CONVERTED CEMR LAB
Alkaline Phosphatase: 124 units/L — ABNORMAL HIGH (ref 39–117)
BUN: 11 mg/dL (ref 6–23)
CO2: 27 meq/L (ref 19–32)
Creatinine, Ser: 0.77 mg/dL (ref 0.40–1.20)
Glucose, Bld: 102 mg/dL — ABNORMAL HIGH (ref 70–99)
HCT: 46 % (ref 36.0–46.0)
Hemoglobin: 14.4 g/dL (ref 12.0–15.0)
MCHC: 31.3 g/dL (ref 30.0–36.0)
MCV: 80.3 fL (ref 78.0–100.0)
RBC: 5.73 M/uL — ABNORMAL HIGH (ref 3.87–5.11)
Sodium: 142 meq/L (ref 135–145)
TSH: 1.029 microintl units/mL (ref 0.350–4.500)
Total Bilirubin: 0.5 mg/dL (ref 0.3–1.2)
Vit D, 25-Hydroxy: 28 ng/mL — ABNORMAL LOW (ref 30–89)
WBC: 8.3 10*3/uL (ref 4.0–10.5)

## 2009-03-07 ENCOUNTER — Ambulatory Visit: Payer: Self-pay | Admitting: Oncology

## 2009-03-11 ENCOUNTER — Ambulatory Visit (HOSPITAL_COMMUNITY): Admission: RE | Admit: 2009-03-11 | Discharge: 2009-03-11 | Payer: Self-pay | Admitting: Oncology

## 2009-03-11 LAB — CEA: CEA: 3.8 ng/mL (ref 0.0–5.0)

## 2009-03-11 LAB — CBC WITH DIFFERENTIAL/PLATELET
BASO%: 0.4 % (ref 0.0–2.0)
EOS%: 1.5 % (ref 0.0–7.0)
HCT: 46 % (ref 34.8–46.6)
LYMPH%: 23.1 % (ref 14.0–49.7)
MCH: 26.1 pg (ref 25.1–34.0)
MCHC: 32.7 g/dL (ref 31.5–36.0)
NEUT%: 69.4 % (ref 38.4–76.8)
Platelets: 248 10*3/uL (ref 145–400)

## 2009-03-11 LAB — COMPREHENSIVE METABOLIC PANEL
ALT: 8 U/L (ref 0–35)
AST: 13 U/L (ref 0–37)
Creatinine, Ser: 0.92 mg/dL (ref 0.40–1.20)
Total Bilirubin: 0.7 mg/dL (ref 0.3–1.2)

## 2009-03-12 ENCOUNTER — Ambulatory Visit: Payer: Self-pay | Admitting: Gastroenterology

## 2009-03-20 ENCOUNTER — Encounter: Payer: Self-pay | Admitting: Family Medicine

## 2009-04-23 ENCOUNTER — Ambulatory Visit: Payer: Self-pay | Admitting: Family Medicine

## 2009-04-23 ENCOUNTER — Encounter: Payer: Self-pay | Admitting: Psychology

## 2009-04-23 ENCOUNTER — Encounter: Payer: Self-pay | Admitting: Family Medicine

## 2009-04-23 LAB — CONVERTED CEMR LAB
ALT: <8 units/L (ref 0–35)
AST: 12 units/L (ref 0–37)
CO2: 23 meq/L (ref 19–32)
Calcium: 9.1 mg/dL (ref 8.4–10.5)
Chloride: 105 meq/L (ref 96–112)
Sodium: 142 meq/L (ref 135–145)
Total Protein: 6.9 g/dL (ref 6.0–8.3)

## 2009-04-26 ENCOUNTER — Encounter: Payer: Self-pay | Admitting: Family Medicine

## 2009-05-07 ENCOUNTER — Ambulatory Visit: Payer: Self-pay | Admitting: Family Medicine

## 2009-05-08 ENCOUNTER — Telehealth (INDEPENDENT_AMBULATORY_CARE_PROVIDER_SITE_OTHER): Payer: Self-pay | Admitting: *Deleted

## 2009-05-21 ENCOUNTER — Ambulatory Visit: Payer: Self-pay | Admitting: Family Medicine

## 2009-06-03 ENCOUNTER — Encounter: Payer: Self-pay | Admitting: Cardiology

## 2009-06-03 ENCOUNTER — Emergency Department (HOSPITAL_COMMUNITY): Admission: EM | Admit: 2009-06-03 | Discharge: 2009-06-04 | Payer: Self-pay | Admitting: Emergency Medicine

## 2009-06-03 ENCOUNTER — Ambulatory Visit: Payer: Self-pay | Admitting: Internal Medicine

## 2009-06-04 ENCOUNTER — Telehealth (INDEPENDENT_AMBULATORY_CARE_PROVIDER_SITE_OTHER): Payer: Self-pay | Admitting: *Deleted

## 2009-06-06 ENCOUNTER — Ambulatory Visit: Payer: Self-pay

## 2009-06-06 ENCOUNTER — Encounter (HOSPITAL_COMMUNITY): Admission: RE | Admit: 2009-06-06 | Discharge: 2009-08-23 | Payer: Self-pay | Admitting: Cardiology

## 2009-06-06 ENCOUNTER — Ambulatory Visit: Payer: Self-pay | Admitting: Cardiovascular Disease

## 2009-06-11 ENCOUNTER — Ambulatory Visit: Payer: Self-pay | Admitting: Family Medicine

## 2009-06-21 ENCOUNTER — Ambulatory Visit: Payer: Self-pay | Admitting: Family Medicine

## 2009-06-25 ENCOUNTER — Ambulatory Visit: Payer: Self-pay | Admitting: Cardiology

## 2009-08-12 ENCOUNTER — Ambulatory Visit: Payer: Self-pay | Admitting: Family Medicine

## 2009-08-13 ENCOUNTER — Telehealth: Payer: Self-pay | Admitting: Psychology

## 2009-08-21 ENCOUNTER — Encounter: Payer: Self-pay | Admitting: Family Medicine

## 2009-08-21 ENCOUNTER — Ambulatory Visit: Payer: Self-pay | Admitting: Family Medicine

## 2009-08-22 LAB — CONVERTED CEMR LAB
Cholesterol: 178 mg/dL (ref 0–200)
LDL Cholesterol: 122 mg/dL — ABNORMAL HIGH (ref 0–99)
Total CHOL/HDL Ratio: 5.6
Triglycerides: 118 mg/dL (ref <150)
VLDL: 24 mg/dL (ref 0–40)

## 2009-08-26 ENCOUNTER — Ambulatory Visit: Payer: Self-pay | Admitting: Psychology

## 2009-09-10 ENCOUNTER — Ambulatory Visit: Payer: Self-pay | Admitting: Family Medicine

## 2009-09-10 DIAGNOSIS — R634 Abnormal weight loss: Secondary | ICD-10-CM | POA: Insufficient documentation

## 2009-09-12 ENCOUNTER — Ambulatory Visit: Payer: Self-pay | Admitting: Psychology

## 2009-09-18 ENCOUNTER — Ambulatory Visit: Payer: Self-pay | Admitting: Oncology

## 2009-09-19 ENCOUNTER — Encounter: Payer: Self-pay | Admitting: Family Medicine

## 2009-09-19 LAB — CEA: CEA: 3 ng/mL (ref 0.0–5.0)

## 2009-09-19 LAB — COMPREHENSIVE METABOLIC PANEL
ALT: <8 U/L (ref 0–35)
AST: 9 U/L (ref 0–37)
CO2: 31 mEq/L (ref 19–32)
Sodium: 142 mEq/L (ref 135–145)
Total Bilirubin: 0.5 mg/dL (ref 0.3–1.2)
Total Protein: 7 g/dL (ref 6.0–8.3)

## 2009-09-19 LAB — CBC WITH DIFFERENTIAL/PLATELET
BASO%: 0.4 % (ref 0.0–2.0)
EOS%: 1.2 % (ref 0.0–7.0)
LYMPH%: 19.1 % (ref 14.0–49.7)
MCH: 25.8 pg (ref 25.1–34.0)
MCHC: 32.7 g/dL (ref 31.5–36.0)
MONO#: 0.4 10*3/uL (ref 0.1–0.9)
RBC: 5.8 10*6/uL — ABNORMAL HIGH (ref 3.70–5.45)
WBC: 7.7 10*3/uL (ref 3.9–10.3)
lymph#: 1.5 10*3/uL (ref 0.9–3.3)

## 2009-09-24 ENCOUNTER — Telehealth: Payer: Self-pay | Admitting: Family Medicine

## 2009-09-25 ENCOUNTER — Telehealth: Payer: Self-pay | Admitting: Family Medicine

## 2009-09-25 ENCOUNTER — Ambulatory Visit: Payer: Self-pay | Admitting: Family Medicine

## 2009-09-25 DIAGNOSIS — M47819 Spondylosis without myelopathy or radiculopathy, site unspecified: Secondary | ICD-10-CM

## 2009-09-30 ENCOUNTER — Telehealth: Payer: Self-pay | Admitting: Family Medicine

## 2009-09-30 ENCOUNTER — Ambulatory Visit: Payer: Self-pay | Admitting: Family Medicine

## 2009-10-01 ENCOUNTER — Ambulatory Visit (HOSPITAL_COMMUNITY): Admission: RE | Admit: 2009-10-01 | Discharge: 2009-10-01 | Payer: Self-pay | Admitting: Oncology

## 2009-10-02 ENCOUNTER — Encounter: Payer: Self-pay | Admitting: *Deleted

## 2009-10-02 ENCOUNTER — Telehealth: Payer: Self-pay | Admitting: Family Medicine

## 2009-10-02 ENCOUNTER — Ambulatory Visit (HOSPITAL_COMMUNITY): Admission: RE | Admit: 2009-10-02 | Discharge: 2009-10-02 | Payer: Self-pay | Admitting: Family Medicine

## 2009-10-04 ENCOUNTER — Encounter: Payer: Self-pay | Admitting: Family Medicine

## 2009-10-04 ENCOUNTER — Telehealth: Payer: Self-pay | Admitting: *Deleted

## 2009-10-04 ENCOUNTER — Telehealth: Payer: Self-pay | Admitting: Family Medicine

## 2009-10-07 ENCOUNTER — Ambulatory Visit: Payer: Self-pay | Admitting: Psychology

## 2009-10-21 ENCOUNTER — Ambulatory Visit (HOSPITAL_COMMUNITY): Admission: RE | Admit: 2009-10-21 | Discharge: 2009-10-22 | Payer: Self-pay | Admitting: Neurosurgery

## 2009-10-25 ENCOUNTER — Telehealth: Payer: Self-pay | Admitting: Psychology

## 2009-10-29 ENCOUNTER — Encounter: Payer: Self-pay | Admitting: Family Medicine

## 2009-11-05 ENCOUNTER — Ambulatory Visit: Payer: Self-pay | Admitting: Family Medicine

## 2009-11-05 DIAGNOSIS — Z22322 Carrier or suspected carrier of Methicillin resistant Staphylococcus aureus: Secondary | ICD-10-CM

## 2009-11-22 ENCOUNTER — Encounter: Payer: Self-pay | Admitting: Family Medicine

## 2010-01-01 ENCOUNTER — Encounter: Payer: Self-pay | Admitting: Family Medicine

## 2010-02-18 ENCOUNTER — Ambulatory Visit: Payer: Self-pay | Admitting: Oncology

## 2010-02-25 ENCOUNTER — Telehealth: Payer: Self-pay | Admitting: Psychology

## 2010-02-27 ENCOUNTER — Encounter: Payer: Self-pay | Admitting: Family Medicine

## 2010-02-27 ENCOUNTER — Encounter: Admission: RE | Admit: 2010-02-27 | Discharge: 2010-02-27 | Payer: Self-pay | Admitting: Family Medicine

## 2010-02-27 ENCOUNTER — Ambulatory Visit: Payer: Self-pay | Admitting: Family Medicine

## 2010-02-27 LAB — CONVERTED CEMR LAB
ALT: <8 units/L (ref 0–35)
AST: 9 units/L (ref 0–37)
Alkaline Phosphatase: 105 units/L (ref 39–117)
BUN: 10 mg/dL (ref 6–23)
Calcium: 9.2 mg/dL (ref 8.4–10.5)
Chloride: 104 meq/L (ref 96–112)
Creatinine, Ser: 0.69 mg/dL (ref 0.40–1.20)
HCT: 42.7 % (ref 36.0–46.0)
Hemoglobin: 14.1 g/dL (ref 12.0–15.0)
MCHC: 33 g/dL (ref 30.0–36.0)
Platelets: 232 10*3/uL (ref 150–400)
Potassium: 3.6 meq/L (ref 3.5–5.3)
RDW: 15.8 % — ABNORMAL HIGH (ref 11.5–15.5)

## 2010-02-28 ENCOUNTER — Encounter: Payer: Self-pay | Admitting: Family Medicine

## 2010-03-05 ENCOUNTER — Encounter: Payer: Self-pay | Admitting: Family Medicine

## 2010-03-06 ENCOUNTER — Ambulatory Visit: Payer: Self-pay | Admitting: Psychology

## 2010-03-07 ENCOUNTER — Ambulatory Visit (HOSPITAL_COMMUNITY): Admission: RE | Admit: 2010-03-07 | Discharge: 2010-03-07 | Payer: Self-pay | Admitting: Oncology

## 2010-03-07 LAB — CBC WITH DIFFERENTIAL/PLATELET
BASO%: 0.3 % (ref 0.0–2.0)
Basophils Absolute: 0 10*3/uL (ref 0.0–0.1)
EOS%: 1.5 % (ref 0.0–7.0)
HCT: 44.8 % (ref 34.8–46.6)
HGB: 14.3 g/dL (ref 11.6–15.9)
LYMPH%: 18.6 % (ref 14.0–49.7)
MCH: 25.6 pg (ref 25.1–34.0)
MCHC: 32 g/dL (ref 31.5–36.0)
MCV: 80.1 fL (ref 79.5–101.0)
MONO%: 5.7 % (ref 0.0–14.0)
NEUT%: 73.9 % (ref 38.4–76.8)
Platelets: 235 10*3/uL (ref 145–400)

## 2010-03-07 LAB — COMPREHENSIVE METABOLIC PANEL
AST: 13 U/L (ref 0–37)
Alkaline Phosphatase: 107 U/L (ref 39–117)
BUN: 7 mg/dL (ref 6–23)
Calcium: 9.1 mg/dL (ref 8.4–10.5)
Chloride: 101 mEq/L (ref 96–112)
Creatinine, Ser: 0.71 mg/dL (ref 0.40–1.20)
Total Bilirubin: 0.5 mg/dL (ref 0.3–1.2)

## 2010-03-12 ENCOUNTER — Encounter: Payer: Self-pay | Admitting: Family Medicine

## 2010-03-13 ENCOUNTER — Ambulatory Visit: Payer: Self-pay | Admitting: Family Medicine

## 2010-03-13 DIAGNOSIS — J984 Other disorders of lung: Secondary | ICD-10-CM

## 2010-03-24 ENCOUNTER — Ambulatory Visit: Payer: Self-pay | Admitting: Psychology

## 2010-03-26 ENCOUNTER — Ambulatory Visit: Payer: Self-pay | Admitting: Internal Medicine

## 2010-04-02 ENCOUNTER — Ambulatory Visit (HOSPITAL_COMMUNITY): Admission: RE | Admit: 2010-04-02 | Discharge: 2010-04-02 | Payer: Self-pay | Admitting: Oncology

## 2010-04-03 ENCOUNTER — Ambulatory Visit: Payer: Self-pay | Admitting: Oncology

## 2010-04-07 LAB — CBC WITH DIFFERENTIAL/PLATELET
BASO%: 0.6 % (ref 0.0–2.0)
Basophils Absolute: 0.1 10*3/uL (ref 0.0–0.1)
EOS%: 1.2 % (ref 0.0–7.0)
HCT: 42.3 % (ref 34.8–46.6)
LYMPH%: 25.3 % (ref 14.0–49.7)
MCH: 25.8 pg (ref 25.1–34.0)
MCHC: 32.4 g/dL (ref 31.5–36.0)
MCV: 79.7 fL (ref 79.5–101.0)
MONO%: 6 % (ref 0.0–14.0)
NEUT%: 66.9 % (ref 38.4–76.8)
Platelets: 236 10*3/uL (ref 145–400)
lymph#: 2.1 10*3/uL (ref 0.9–3.3)

## 2010-04-08 ENCOUNTER — Ambulatory Visit: Payer: Self-pay | Admitting: Psychology

## 2010-04-08 ENCOUNTER — Telehealth: Payer: Self-pay | Admitting: Family Medicine

## 2010-04-09 ENCOUNTER — Encounter: Payer: Self-pay | Admitting: Internal Medicine

## 2010-04-09 ENCOUNTER — Telehealth: Payer: Self-pay | Admitting: Internal Medicine

## 2010-04-09 ENCOUNTER — Ambulatory Visit: Payer: Self-pay | Admitting: Internal Medicine

## 2010-04-15 ENCOUNTER — Ambulatory Visit: Payer: Self-pay | Admitting: Family Medicine

## 2010-04-22 ENCOUNTER — Telehealth: Payer: Self-pay | Admitting: Family Medicine

## 2010-04-22 ENCOUNTER — Ambulatory Visit: Payer: Self-pay | Admitting: Surgery

## 2010-04-23 ENCOUNTER — Encounter: Payer: Self-pay | Admitting: Internal Medicine

## 2010-04-26 ENCOUNTER — Encounter: Payer: Self-pay | Admitting: Family Medicine

## 2010-04-28 ENCOUNTER — Ambulatory Visit: Payer: Self-pay | Admitting: Psychology

## 2010-04-28 ENCOUNTER — Ambulatory Visit (HOSPITAL_COMMUNITY)
Admission: RE | Admit: 2010-04-28 | Discharge: 2010-04-28 | Payer: Self-pay | Source: Home / Self Care | Attending: Surgery | Admitting: Surgery

## 2010-04-28 ENCOUNTER — Encounter: Payer: Self-pay | Admitting: Family Medicine

## 2010-04-28 ENCOUNTER — Ambulatory Visit: Payer: Self-pay | Admitting: Family Medicine

## 2010-05-05 ENCOUNTER — Ambulatory Visit: Payer: Self-pay

## 2010-05-05 ENCOUNTER — Ambulatory Visit: Payer: Self-pay | Admitting: Family Medicine

## 2010-05-14 ENCOUNTER — Encounter: Payer: Self-pay | Admitting: Cardiology

## 2010-05-14 ENCOUNTER — Ambulatory Visit: Payer: Self-pay | Admitting: Cardiology

## 2010-05-15 ENCOUNTER — Ambulatory Visit: Payer: Self-pay | Admitting: Oncology

## 2010-06-07 ENCOUNTER — Encounter: Payer: Self-pay | Admitting: Oncology

## 2010-06-08 ENCOUNTER — Encounter: Payer: Self-pay | Admitting: Neurology

## 2010-06-09 ENCOUNTER — Encounter: Payer: Self-pay | Admitting: Oncology

## 2010-06-10 ENCOUNTER — Encounter: Payer: Self-pay | Admitting: Cardiology

## 2010-06-10 ENCOUNTER — Ambulatory Visit
Admission: RE | Admit: 2010-06-10 | Discharge: 2010-06-10 | Payer: Self-pay | Source: Home / Self Care | Attending: Surgery | Admitting: Surgery

## 2010-06-11 ENCOUNTER — Telehealth: Payer: Self-pay | Admitting: Psychology

## 2010-06-11 NOTE — Assessment & Plan Note (Signed)
OFFICE VISIT  Lauren, [name] DOB:  1950-06-19                                        June 10, 2010 CHART #:  [id number]  The patient returned to my office today for followup of a left upper lobe lung cancer.  I saw her back on April 22, 2010, for her new patient consultation for this left upper lobe lung lesion.  This lesion has been present since CT scan in January 2009, and has increased in size and has become more solid and nodular.  She had a CT-guided needle biopsy which showed adenocarcinoma.  She has been followed by Dr. Gaylyn [name] for history of colon cancer resected in November of 2008.  When I saw her in December, I sent her for a PET scan which shows increased uptake in this posterior left upper lobe lung nodule with FEV of around 2.5 consistent with adenocarcinoma.  There are no other areas of hypermetabolic activity within the chest or mediastinum.  There was some mild hypermetabolic activity in the left adrenal gland,but there was no associated mass present and it was felt that this was most likely physiologic.  There were no other hypermetabolic areas noted in the body.  We also obtained pulmonary function testing done by Dr. Sherene [name] in November 2011, and this showed that her FEV-1 was around 1.3 which was 63% predicted.  Her FVC was 61% predicted.  Her peak expiratory flow was 77% predicted.  She did not have the diffusion capacity done.  These were consistent with mild airway obstruction with a low vital capacity possibly due to restriction lung volumes.  She was also sent to see Dr. Valera [name] for cardiology clearance, since she had history of nonobstructive coronary disease in the past.  He saw her on May 14, 2010, and felt that she was at low cardiac risk for left lung resection either by wedge resection or lobectomy and was therefore cleared for surgery.  Since I last saw her, she said she has continued to smoke some, has been  trying to keep it to a minimum which has been less than half pack a day.  She denies any cough or shortness of breath.  She has had no chest pain.  PHYSICAL EXAMINATION:  Vital Signs:  Her blood pressure is 126/80, pulse 74 and regular, respiratory rate 16 unlabored.  Oxygen saturation on room air is 98%.  General:  She looks well.  Cardiac:  Shows regular rate and rhythm with normal heart sounds.  Lungs:  Clear.  IMPRESSION:  The patient is ready for surgery.  I would plan to perform flexible fiberoptic bronchoscopy followed by left thoracotomy and either wedge resection of the left upper lobe lung lesion or left upper lobectomy depending on intraoperative findings.  This lesion is very peripheral and I think a wedge resection would be best if it can be performed.  She does have significant chronic obstructive pulmonary disease and her FEV-1 is only 1.3.  I think we had to perform a lobectomy, if she may have some pulmonary limitation.  She also has some other small lesions in the left upper lobe as well as in the right upper lobe and continues to smoke so I would like to minimize the lung resection if possible.  All the other lung lesions that are seen on CT scan are very small  and do not light up on PET scan.  The only new lung lesion that was noted on her most recent CT scan was a 3-mm nodule in the right upper lobe.  This will require continued followup.  She does not have any significant lymphadenopathy on CT scan of the chest and certainly none over 1 cm.  None of her lymph nodes light up on PET scan, I do not think there is a need to perform mediastinoscopy at the time of surgery.  I discussed the studies with her and her daughter including my operative plans.  We discussed the benefits and risks of surgery including, but not limited to bleeding, blood transfusion, infection, prolonged air leak from the lung, respiratory failure, perioperative organ dysfunction, and recurrence  of cancer.  She understands all this and agrees to proceed.  We will plan to do surgery on Monday June 16, 2010.  Lauren [name], M.D. Electronically Signed  BB/MEDQ  D:  06/10/2010  T:  06/11/2010  Job:  [id number]  cc:   Lauren C. Daleen Squibb, MD, [facility] Lauren Bolus, MD

## 2010-06-12 LAB — BLOOD GAS, ARTERIAL
Acid-Base Excess: 5.3 mmol/L — ABNORMAL HIGH (ref 0.0–2.0)
Drawn by: 206361
FIO2: 0.21 %
O2 Saturation: 98.5 %
Patient temperature: 98.6
pO2, Arterial: 112 mmHg — ABNORMAL HIGH (ref 80.0–100.0)

## 2010-06-12 LAB — CBC
HCT: 43.6 % (ref 36.0–46.0)
MCH: 25.5 pg — ABNORMAL LOW (ref 26.0–34.0)
MCHC: 32.3 g/dL (ref 30.0–36.0)
MCV: 78.7 fL (ref 78.0–100.0)
Platelets: 211 10*3/uL (ref 150–400)
RDW: 16.2 % — ABNORMAL HIGH (ref 11.5–15.5)
WBC: 7.9 10*3/uL (ref 4.0–10.5)

## 2010-06-12 LAB — COMPREHENSIVE METABOLIC PANEL
ALT: <8 U/L (ref 0–35)
AST: 16 U/L (ref 0–37)
Alkaline Phosphatase: 94 U/L (ref 39–117)
CO2: 26 mEq/L (ref 19–32)
Calcium: 9.3 mg/dL (ref 8.4–10.5)
GFR calc non Af Amer: >60 mL/min (ref >60)
Glucose, Bld: 88 mg/dL (ref 70–99)
Potassium: 4.1 mEq/L (ref 3.5–5.1)
Sodium: 138 mEq/L (ref 135–145)
Total Protein: 6.3 g/dL (ref 6.0–8.3)

## 2010-06-12 LAB — URINALYSIS, ROUTINE W REFLEX MICROSCOPIC
Bilirubin Urine: NEGATIVE
Hgb urine dipstick: NEGATIVE
Ketones, ur: NEGATIVE mg/dL
Protein, ur: NEGATIVE mg/dL
Urine Glucose, Fasting: NEGATIVE mg/dL
Urobilinogen, UA: 1 mg/dL (ref 0.0–1.0)

## 2010-06-12 LAB — APTT: aPTT: 34 seconds (ref 24–37)

## 2010-06-12 LAB — TYPE AND SCREEN

## 2010-06-12 LAB — SURGICAL PCR SCREEN: Staphylococcus aureus: NEGATIVE

## 2010-06-16 ENCOUNTER — Other Ambulatory Visit: Payer: Self-pay | Admitting: Surgery

## 2010-06-16 ENCOUNTER — Inpatient Hospital Stay (HOSPITAL_COMMUNITY)
Admission: RE | Admit: 2010-06-16 | Discharge: 2010-06-20 | DRG: 165 | Disposition: A | Discharge status: Home / Self Care | Payer: Medicare Other | Attending: Surgery | Admitting: Surgery

## 2010-06-16 DIAGNOSIS — C3410 Malignant neoplasm of upper lobe, unspecified bronchus or lung: Principal | ICD-10-CM | POA: Diagnosis present

## 2010-06-16 DIAGNOSIS — Z85038 Personal history of other malignant neoplasm of large intestine: Secondary | ICD-10-CM

## 2010-06-16 DIAGNOSIS — F17200 Nicotine dependence, unspecified, uncomplicated: Secondary | ICD-10-CM | POA: Diagnosis present

## 2010-06-16 DIAGNOSIS — K5909 Other constipation: Secondary | ICD-10-CM | POA: Diagnosis present

## 2010-06-16 DIAGNOSIS — I10 Essential (primary) hypertension: Secondary | ICD-10-CM | POA: Diagnosis present

## 2010-06-16 DIAGNOSIS — I2510 Atherosclerotic heart disease of native coronary artery without angina pectoris: Secondary | ICD-10-CM | POA: Diagnosis present

## 2010-06-16 DIAGNOSIS — C3490 Malignant neoplasm of unspecified part of unspecified bronchus or lung: Secondary | ICD-10-CM

## 2010-06-16 DIAGNOSIS — J438 Other emphysema: Secondary | ICD-10-CM | POA: Diagnosis present

## 2010-06-16 DIAGNOSIS — Z7982 Long term (current) use of aspirin: Secondary | ICD-10-CM

## 2010-06-16 DIAGNOSIS — E876 Hypokalemia: Secondary | ICD-10-CM | POA: Diagnosis present

## 2010-06-16 HISTORY — PX: LUNG LOBECTOMY: SHX167

## 2010-06-16 HISTORY — DX: Malignant neoplasm of unspecified part of unspecified bronchus or lung: C34.90

## 2010-06-17 LAB — CBC
MCHC: 31.6 g/dL (ref 30.0–36.0)
MCV: 80.1 fL (ref 78.0–100.0)
Platelets: 169 10*3/uL (ref 150–400)
RDW: 16.3 % — ABNORMAL HIGH (ref 11.5–15.5)
WBC: 10 10*3/uL (ref 4.0–10.5)

## 2010-06-17 LAB — BASIC METABOLIC PANEL
BUN: 5 mg/dL — ABNORMAL LOW (ref 6–23)
Calcium: 8 mg/dL — ABNORMAL LOW (ref 8.4–10.5)
Creatinine, Ser: 0.79 mg/dL (ref 0.4–1.2)
GFR calc non Af Amer: >60 mL/min (ref >60)
Glucose, Bld: 162 mg/dL — ABNORMAL HIGH (ref 70–99)
Potassium: 3.5 mEq/L (ref 3.5–5.1)

## 2010-06-17 LAB — POCT I-STAT 3, ART BLOOD GAS (G3+)
Acid-base deficit: 1 mmol/L (ref 0.0–2.0)
Patient temperature: 99.3
TCO2: 26 mmol/L (ref 0–100)
pH, Arterial: 7.366 (ref 7.350–7.400)

## 2010-06-18 ENCOUNTER — Inpatient Hospital Stay (HOSPITAL_COMMUNITY): Payer: Medicare Other

## 2010-06-18 LAB — CBC
Hemoglobin: 11.6 g/dL — ABNORMAL LOW (ref 12.0–15.0)
MCH: 25.3 pg — ABNORMAL LOW (ref 26.0–34.0)
MCHC: 31.6 g/dL (ref 30.0–36.0)
Platelets: 162 10*3/uL (ref 150–400)
RDW: 16.5 % — ABNORMAL HIGH (ref 11.5–15.5)

## 2010-06-18 LAB — COMPREHENSIVE METABOLIC PANEL
ALT: 10 U/L (ref 0–35)
AST: 18 U/L (ref 0–37)
Albumin: 2.7 g/dL — ABNORMAL LOW (ref 3.5–5.2)
Calcium: 8.3 mg/dL — ABNORMAL LOW (ref 8.4–10.5)
Creatinine, Ser: 0.76 mg/dL (ref 0.4–1.2)
GFR calc Af Amer: >60 mL/min (ref >60)
GFR calc non Af Amer: >60 mL/min (ref >60)
Sodium: 137 mEq/L (ref 135–145)
Total Protein: 5.8 g/dL — ABNORMAL LOW (ref 6.0–8.3)

## 2010-06-19 ENCOUNTER — Inpatient Hospital Stay (HOSPITAL_COMMUNITY): Payer: Medicare Other

## 2010-06-19 NOTE — Miscellaneous (Signed)
Summary: Surgery new pt visit              NEW PATIENT CONSULTATION      Lauren [name], Lauren [name]   DOB:  Jul 03, 1950                                        April 23, 2010   CHART #:  [id number]      REASON FOR CONSULTATION:  Left upper lobe lung nodule.      CLINICAL HISTORY:  The patient is a 60 year old smoker with a history of   colon cancer resection in 2008, with negative lymph nodes that required   no further therapy.  She had a CT scan of the chest, abdomen, and pelvis   done in October 2010.  The lung windows demonstrated a ground-glass   attenuation nodule within the posterior right upper lobe measuring 1 cm   that was similar to previous scan in January 2009.  There was a 4-mm   ground-glass nodule in the posterior right upper lobe or superior   segment of the right lower lobe that was unchanged.  There is a   posterior left upper lobe ground glass opacity with some solid part   measuring 1.4 x 0.7 cm that had been 1.5 x 0.8 cm on prior exam.  The   increase in the solid component at that time was felt to be due to   slight flexion differences.  There is also a left upper lobe   interstitial opacity that was similar to previous CT in January 2009.   The patient has continued to smoke, although she has decreased the   amount that she is smoking to about 1/2 pack of cigarettes per day.  She   underwent a followup CT scan in October 2011, which showed that there   was slight enlargement of the left upper lobe nodule which appeared more   nodular and solid in appearance.  The lesion had increased in size to   1.4 x 1.0 cm.  There are several other tiny left upper lobe nodules that   did not change over the previous 2 years.  A new nodule in the right   upper lobe measured 3 mm and was in the apical segment.  The ground-   glass opacity in the right upper lobe posteriorly have been stable over   the previous 2 years.  There is also a new 3-mm pulmonary nodule noted   in the right  upper lobe medially.  There is a left thyroid lobe cystic   lesion that was minimally larger compared to the previous exam.  Given   the slow enlargement of the lesion in the posterior left upper lobe, a   CT-guided needle biopsy was performed and this showed adenocarcinoma.      REVIEW OF SYSTEMS:  Her review of systems is as follows:   GENERAL:  She does report some weight loss over the past year attributes   this to poor appetite.  She denies any significant fatigue.   EYES:  Negative.   ENT:  Negative.   ENDOCRINE:  She denies diabetes and hypothyroidism.   CARDIOVASCULAR:  She does report chest tightness and pressure and a   feeling of something laying on her chest with exertion.  She has had   exertional dyspnea such as walking briskly or going upstairs on an  incline.  She denies PND and orthopnea.  She has had no peripheral edema   or palpitations.   RESPIRATORY:  She does report productive cough, but denies hemoptysis.   She occasionally has wheezing.   GI:  She has had no nausea or vomiting.  She denies melena and bright   red blood per rectum.  She has a history of constipation.   GU:  She denies dysuria and hematuria.   MUSCULOSKELETAL:  She denies arthralgias and myalgias.   NEUROLOGICAL:  She has had headaches.  She has a history of brain   aneurysm, treated nonsurgically and had mild numbness in her fingers and   toes on the right side related to that.  This has been followed by Dr.   Pearlean Brownie.   PSYCHIATRIC:  She does have a history of depression and anxiety.   HEMATOLOGICAL:  She denies any history of bleeding disorders or easy   bleeding.      ALLERGIES:  None.      MEDICATIONS:   1. Aspirin 81 mg daily.   2. Citalopram 40 mg daily.   3. Pravastatin 40 mg daily.   4. Dexlansoprazole 60 mg daily.   5. Clonidine 0.1 mg daily.   6. HCTZ 25 mg daily.      PAST MEDICAL HISTORY:  Significant for hypertension and hyperlipidemia.   She has a history of nonobstructive  coronary disease status post   multiple admissions for chest pain and cardiac catheterization in 2001   and 2006.  She is followed by [facility]. Daleen [facility].  She has a   history of colon cancer status post resection in November 2008.  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  She had no lymph nodes positive and no vascular or   lymphatic invasion.  She has a history of depression.  She has a history   of a brain aneurysm that has been treated nonsurgically and followed by   Dr. Pearlean Brownie.      SOCIAL HISTORY:  She is widowed and has 3 adult children.  She is here   with her daughter today.  She is retired.  She still smokes about 1/2   pack of cigarettes per day, but has smoked much more in the past.  She   denies alcohol use.      FAMILY HISTORY:  Positive for cancer.  Her mother died of lymphoma and   she has had other family members died of lung cancer and pancreatic   cancer.      PHYSICAL EXAMINATION:  Vital Signs:  Her blood pressure is 136/76, pulse   of 60 and regular, and respiratory rate is 16, unlabored.  Oxygen   saturation on room air is 96%.  General:  She is a well-developed   [place] female, in no distress.  HEENT:  Normocephalic and   atraumatic.  Pupils are equal and reactive to light.  Sclerae are clear.   Oropharynx is clear.  Neck:  Normal carotid pulses bilaterally.  There   are no bruits.  There is no cervical or supraclavicular adenopathy.   There is no axillary adenopathy.  Lungs:  Clear.  Cardiac:  Regular rate   and rhythm with normal S1 and S2.  There is no murmur, rub, or gallop.   Abdomen:  Active bowel sounds.  Abdomen is soft and nontender.  There   are no palpable masses or  organomegaly.  Extremities:  No peripheral   edema.  Pedal pulses are palpable bilaterally.  Skin:  Warm and dry.   Neurologic:  Alert and oriented x3.  Motor and sensory exams grossly   normal.      IMPRESSION:   The patient has a 1.4 x 1.0 cm biopsy-proven adenocarcinoma   in the posterior aspect of the left upper lobe that has been slowly   enlarging on serial CT scans.  She has multiple other small pulmonary   nodules bilaterally, some of which have been unchanged and a few of   which are new.  I suspect this is a primary lung cancer since CT scan of   the abdomen and pelvis has shown no other evidence of metastatic colon   cancer.  She said she has been seen by Dr. Sandrea Hughs recently for   pulmonary evaluation and had pulmonary function testing done and I will   get those results from his office.  We will do a PET scan to assess for   any other evidence of malignant disease.  Approximately, 15-25% of   patients will have occult malignant disease diagnosed by PET scan.  We   will also have her see Dr. Daleen Squibb for preoperative cardiac clearance given   her history of nonobstructive coronary disease by catheterization in   2006, and her multiple cardiac risk factors.  She did have progression   of her coronary disease from 2001 to 2006, and has symptoms that could   be attributed to significant coronary disease.  If her pulmonary   function tests are acceptable and she is cleared from a cardiology   standpoint, then I would plan surgical resection.  This lesion would be   amenable to wedge resection.  I will make a decision about whether to   perform wedge resection or lobectomy based on her other preoperative   evaluation.  I will plan to see her back in the office after the above   workup has been completed, so that we can discuss further plans for   treatment with her and her daughter.      [name], M.D.

## 2010-06-19 NOTE — Miscellaneous (Signed)
Summary: Patient needs urgent referral.  Blue Team:  Please see phone note from tonight to patient (I signed the note by mistake).  She needs urgent referral as in note.  Please make referral and call patient.  Romero Belling MD  Oct 02, 2009 7:44 PM  Urgent referral, notes and MRI report faxed to [facility], will call and check on referral later this afternoon...............................................Marland KitchenGaren Grams LPN Oct 03, 2009 10:54 AM  Pt has appt with Dr. Phoebe Perch at [facility] tomm (10/04/09) at 8:30am, will cancel appt here for tomm, patient informed...............................................Marland KitchenGaren Grams LPN Oct 03, 2009 3:32 PM

## 2010-06-19 NOTE — Assessment & Plan Note (Signed)
Summary: PET scan results  NM PET Imag Initial(PI) Skull BS/Thigh - STATUS: Final  .                                         Perform Date: 12Dec11 10:20  Ordered By: Edman Circle,          Ordered Date: 12Dec11 07:12  Facility: [facility]                              Department: NM  Service Report Text  [facility] Accession Number: [id number]      Clinical Data: Initial treatment strategy for a left upper lobe   lung nodule.  Biopsy demonstrating adenocarcinoma.  Indeterminate   for primary bronchogenic lesion versus metastatic colorectal   carcinoma.    NUCLEAR MEDICINE PET CT SKULL BASE TO THIGH    Technique:  16.1 mCi F-18 FDG was injected intravenously via the   right AC.  Full-ring PET imaging was performed from the skull base   through the mid-thighs 84  minutes after injection.  CT data was   obtained and used for attenuation correction and anatomic   localization only.  (This was not acquired as a diagnostic CT   examination.)    Fasting Blood Glucose:  122    Patient Weight:  148 pounds.    Comparison: Diagnostic CTs of 03/07/2010, chest, abdomen, pelvis.    Findings: PET images demonstrate no abnormal activity within the   neck.  The posterior left upper lobe lung nodule is mildly   hypermetabolic.  This measures 1.1 cm and a S.U.V. max of 2.7 on   image 56.  No hypermetabolic nodal activity within the chest.    Mild hypermetabolism within the left adrenal gland medially.  Is   mild thickening of this portion of the gland without well-defined   nodule.  This measures a S.U.V. max of 3.3 on image 118.  No other   areas of abnormal increased activity within the abdomen or pelvis.    CT images performed for attenuation correction demonstrate no   significant findings in the neck.  Multivessel coronary artery   atherosclerosis.  Redemonstration of ill-defined pulmonary nodules   and areas of interstitial thickening.  None of these areas are   hypermetabolic at PET.   Abdominal/pelvic findings deferred to   recent diagnostic CT.    Degenerative sclerosis of the left sacroiliac joint.    IMPRESSION:    1.  Mild hypermetabolism within the left upper lobe lung nodule.   No evidence of thoracic nodal or extrathoracic metastatic disease.   This combination of findings is favored to represent a metachronous   primary bronchogenic adenocarcinoma.  Isolated metastatic disease   is felt unlikely.   2.  Left adrenal hypermetabolism without well-defined nodule or   mass.  Favored to be physiologic. Recommend attention on follow-up.    Read By:  Consuello Bossier,  M.D.   Released By:  Consuello Bossier,  M.D.  Additional Information  HL7 RESULT STATUS : F  External IF Update Timestamp : 2010-04-28:11:41:08.000000

## 2010-06-19 NOTE — Assessment & Plan Note (Signed)
Summary: pneumonia vaccine/bmc  Nurse Visit   Vital Signs:  Patient profile:   60 year old female Temp:     98.4 degrees F  Vitals Entered By: Theresia Lo RN (May 05, 2010 10:16 AM)  Allergies: 1)  ! Lisinopril  Immunizations Administered:  Pneumonia Vaccine:    Vaccine Type: Pneumovax (Medicare)    Site: left deltoid    Mfr: Merck    Dose: 0.5 ml    Route: IM    Given by: Theresia Lo RN    Exp. Date: 10/10/2011    Lot #: [id number]    VIS given: 04/22/09 version given May 05, 2010.  Orders Added: 1)  Pneumococcal Vaccine [90732] 2)  Admin 1st Vaccine [90471]     Vital Signs:  Patient profile:   60 year old female Temp:     98.4 degrees F  Vitals Entered By: Theresia Lo RN (May 05, 2010 10:16 AM)

## 2010-06-19 NOTE — Assessment & Plan Note (Signed)
Summary: Behavioral Medicine Follow-up   Primary Care Provider:  Alvia Grove DO   History of Present Illness: Lauren [name] reports that she was diagnosed with lung cancer (can't remember what type but she says it is relatively rare).  She said she bled during the biopsy and they tested the blood to determine it was cancer.  She will meet with a surgeon and go from there.  She has told her daughter and some other support people but not her sons or others until after she meets with the surgeon.  She thinks she needs to stop smoking if she wants to live and reports that she is not a "cold [place]" kind of person.  She hates being told what she has to do.  She does not want to talk about it further today.    Allergies: 1)  ! 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  She is not tearful when talking about the diagnosis and reports she is doing fine.  She has researched the illness and voices to me that she recognizes it could kill her.  Not sure if this is denial or a wait and see approach.  She denies feeling depressed except occasionally on the weekends when she gets lonely.  She continues relationships with multiple men (but is only sexually intimate with one currently).  Asked how I could best support her during this process.  She mentioned being here if she needs me.  We scheduled for the week of December 12th but she is to call sooner if anything comes up.  Will follow as needed.  Orders: Therapy 40-50- min- [facility] (16109)  Complete Medication List: 1)  Hydrochlorothiazide 25 Mg Tabs (Hydrochlorothiazide) .Marland Kitchen.. 1 by mouth once daily for blood pressure 2)  Catapres 0.1 Mg Tabs (Clonidine hcl) .... One at bedtime 3)  Adult Aspirin Ec Low Strength 81 Mg Tbec (Aspirin) .Marland Kitchen.. 1 tab daily 4)  Nitroglycerin 0.4 Mg Subl (Nitroglycerin) .... Place one under tongue for chest pain 5)  Pravastatin Sodium 80  Mg Tabs (Pravastatin sodium) .Marland Kitchen.. 1 by mouth at bedtime for cholesterol 6)  Citalopram Hydrobromide 40 Mg Tabs (Citalopram hydrobromide) .Marland Kitchen.. 1 by mouth once daily for depression/anxiety 7)  Bystolic 10 Mg Tabs (Nebivolol hcl) .... One tablet daily 8)  Dexilant 60 Mg Cpdr (Dexlansoprazole) .... Take one by mouth daily 9)  Pepcid 20 Mg Tabs (Famotidine) .... Take one by mouth at bedtime   Orders Added: 1)  Therapy 40-50- min- [facility] [[id number]]

## 2010-06-19 NOTE — Progress Notes (Signed)
Summary: Req to speak with MD  Phone Note Call from Patient Call back at Home Phone [phone] Call back at [phone]   Reason for Call: Talk to Nurse Summary of Call: pt asking to speak with MD re: her headaches, pt scheduled tue 11/29 however she was just dx with lung cancer & also has a brain aneurysm & wants to see what she can take. Initial call taken by: Knox Royalty,  April 08, 2010 1:43 PM     Returned call 04-14-10 0945 Pt already has appt with me for tomorrow.  Stated she would update me then.  No urgent needs now.

## 2010-06-19 NOTE — Assessment & Plan Note (Signed)
Summary: f/u eo   Vital Signs:  Patient profile:   60 year old female Height:      64 inches Weight:      153.06 pounds BMI:     26.37 BSA:     1.75 Temp:     97.8 degrees F Pulse rate:   60 / minute BP sitting:   154 / 76  Vitals Entered By: Jone Baseman CMA (April 28, 2010 3:15 PM) CC: f/u Is Patient Diabetic? No Pain Assessment Patient in pain? no        Primary Care Provider:  Alvia Grove DO  CC:  f/u.  History of Present Illness: 61 yo female here for f/u of PET scan and to discuss options for tobacco cessation. Nikkita saw Dr. Pascal Lux earlier today and had a PET scan today at [facility] as well. Is anxous and wants to discuss her results.   Trying to quit smoking.  Only able to successfully stop for 4 days.  Discussed options for quitting.  Intially interested in patches, today wants to try chantix.  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  (ICD-V02.54) 3)  Back Pain With Radiculopathy  (ICD-729.2) 4)  Weight Loss, Recent  (ICD-783.21) 5)  Postmenopausal Syndrome  (ICD-627.9) 6)  COPD Unspecified  (ICD-496) 7)  Sexual Activity, High Risk  (ICD-V69.2) 8)  Constipation  (ICD-564.00) 9)  Personal Hx Colon Cancer  (ICD-V10.05) 10)  Tobacco Abuse  (ICD-305.1) 11)  Mitral Valve Prolapse, Hx of  (ICD-V12.50) 12)  Cad  (ICD-414.00) 13)  Depressive Disorder, Major, Rcr, Mild  (ICD-296.31) 14)  Hypercholesterolemia  (ICD-272.0) 15)  Gerd  (ICD-530.81) 16)  Cerebral Aneurysm  (ICD-437.3) 17)  Hypertension, Benign Essential  (ICD-401.1)  Current Medications (verified): 1)  Hydrochlorothiazide 25 Mg Tabs (Hydrochlorothiazide) .Marland Kitchen.. 1 By  Mouth Once Daily For Blood Pressure 2)  Catapres 0.1 Mg  Tabs (Clonidine Hcl) .... One At Bedtime 3)  Adult Aspirin Ec Low Strength 81 Mg  Tbec (Aspirin) .Marland Kitchen.. 1 Tab Daily 4)  Nitroglycerin 0.4 Mg  Subl (Nitroglycerin) .... Place One Under Tongue For Chest Pain 5)  Pravastatin Sodium 80 Mg Tabs (Pravastatin Sodium) .Marland Kitchen.. 1 By Mouth At Bedtime For Cholesterol 6)  Citalopram Hydrobromide 40 Mg Tabs (Citalopram Hydrobromide) .Marland Kitchen.. 1 By Mouth Once Daily For Depression/anxiety 7)  Bystolic 10 Mg  Tabs (Nebivolol Hcl) .... One Tablet Daily 8)  Ambien 10 Mg Tabs (Zolpidem Tartrate) .... Take 1 Pill By Mouth At Bedtime 9)  Chantix Starting Month Pak 0.5 Mg X 11 & 1 Mg X 42 Tabs (Varenicline Tartrate) .... Start Pack Tomorrow Morning  Allergies (verified): 1)  ! 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  ;  Smoking at up to 2 ppd since age 17 ; Uses EtOH in moderation. No illicit drugs.  Drinks lots of caffeine.; Has 3 grown children but one has had some trouble financially.  One son lives locally, other 2 children are in other states  Risk Factors: Smoking Status: quit (04/28/2010) Packs/Day: 2.0 (04/28/2010) Passive Smoke Exposure: yes (04/28/2010)  Past Medical History: PET scan 04-28-10: primary  Lung nodule noted on CT, most recent shows increase in size (02/2010)      - FNA ordered for 04/02/10 by HA> pos Ca  chronic folliculitis of groin,  fibrocystic breast changes,  h/o hypokalemia,  h/o syncope--most likely vasovagal etiology,    Left chest/ axillary line nevus (1x0.5x0.5cm 1/05),  mitral valve prolapse,  Non-obstructive CAD,  trichomonas vaginalis treated 05/2005 left-sided colon cancer, T2 lesion, status post resection in 2008 did not need chemotherapy and radiation POSTMENOPAUSAL SYNDROME (ICD-627.9) COPD UNSPECIFIED (ICD-496) 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  Awake, cooperative.  Psych:  Oriented X3, memory intact for recent and remote, good eye contact, not anxious appearing, not depressed appearing, not suicidal, and not homicidal.     Impression & Recommendations:  Problem # 1:  TOBACCO ABUSE (ICD-305.1) going to try chantix Discussed s/e and advised pt to tell me if she notes any mood changes.   follow up scheduled for next week. Her updated medication list for this problem includes:    Chantix Starting Month Pak 0.5 Mg X 11 & 1 Mg X 42 Tabs (Varenicline tartrate) ..... Start pack tomorrow morning  Orders: [facility]- Est Level  3 ([address])  Complete Medication List: 1)  Hydrochlorothiazide 25 Mg Tabs (Hydrochlorothiazide) .Marland Kitchen.. 1 by mouth once daily for blood pressure 2)  Catapres 0.1 Mg Tabs (Clonidine hcl) .... One at bedtime 3)  Adult Aspirin Ec Low Strength 81 Mg Tbec (Aspirin) .Marland Kitchen.. 1 tab daily 4)  Nitroglycerin 0.4 Mg Subl (Nitroglycerin) .... Place one under tongue for chest pain 5)  Pravastatin Sodium 80 Mg Tabs (Pravastatin sodium) .Marland Kitchen.. 1 by mouth at bedtime for cholesterol 6)   Citalopram Hydrobromide 40 Mg Tabs (Citalopram hydrobromide) .Marland Kitchen.. 1 by mouth once daily for depression/anxiety 7)  Bystolic 10 Mg Tabs (Nebivolol hcl) .... One tablet daily 8)  Ambien 10 Mg Tabs (Zolpidem tartrate) .... Take 1 pill by mouth at bedtime 9)  Chantix Starting Month Pak 0.5 Mg X 11 & 1 Mg X 42 Tabs (Varenicline tartrate) .... Start pack tomorrow morning  Patient Instructions: 1)  Good to see you today. 2)  Try and get the Chantix as soon as you can.  I sent the medicine to [facility]. 3)  Schedule an appt to f/u with me on 12-19 (ok to double book) Prescriptions: CHANTIX STARTING MONTH PAK 0.5 MG X 11 & 1 MG X 42 TABS (VARENICLINE TARTRATE) start pack tomorrow morning  #1 x 0   Entered and Authorized by:   Alvia Grove DO   Signed by:   Alvia Grove DO on 04/28/2010   Method used:   [facility]ron[facility] to  [facility]       [address]       [place], [place]  [address]       Ph: [phone]       Fax: [phone]   RxID:   [id number]    Orders Added: 1)  [facility]- Est Level  3 [[address]]

## 2010-06-19 NOTE — Consult Note (Signed)
Summary: [facility]  [facility]   Imported By: Knox Royalty 03/21/2010 09:35:44  _____________________________________________________________________  External Attachment:    Type:   Image     Comment:   External Document

## 2010-06-19 NOTE — Progress Notes (Signed)
Summary: triage  Phone Note Call from Patient Call back at [phone]   Caller: Patient Summary of Call: Pt has slipped disc and has numbness left thigh from knee up and under bottom of foot.  Could this be from her disc? Initial call taken by: Clydell Hakim,  Sep 30, 2009 9:19 AM  Follow-up for Phone Call        asked her to come in. denies saddle anesthesia or incontinence. denies pain. taking meds as ordered. worried about this. will see Dr. Constance Goltz at 11 Follow-up by: Golden Circle RN,  Sep 30, 2009 9:28 AM

## 2010-06-19 NOTE — Consult Note (Signed)
Summary: [facility]   [facility]   Imported By: Clydell Hakim 09/24/2009 15:08:43  _____________________________________________________________________  External Attachment:    Type:   Image     Comment:   External Document
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
## 2010-06-19 NOTE — Assessment & Plan Note (Signed)
Summary: fu/kh   Vital Signs:  Patient profile:   60 year old female Height:      64 inches Weight:      162.7 pounds BMI:     28.03 Temp:     98.2 degrees F oral Pulse rate:   59 / minute BP sitting:   141 / 74  (left arm) Cuff size:   regular  Vitals Entered By: Garen Grams LPN (August 12, 2009 4:41 PM) CC: constpiation, HTN, mood swings. Is Patient Diabetic? No Pain Assessment Patient in pain? no        Primary Care Provider:  Ancil Boozer  MD  CC:  constpiation, HTN, and mood swings..  History of Present Illness: constipation: has noted for about 3 or so weeks.  has had problems like this before.  noticing very hard stools that are difficult to get out.  no blood in stools.  has been taking miralax once daily and nothing else.   HTN: states she is taking her meds as prescribed.  denies chest pain, shortness of breath.  does have intermittent headaches (see previous notes).  still trying to get in with dr sethi/[facility] about known cerebral aneurysm.  knows the importance of taking her meds regularly given known aneurysm.  denies syncope or dizziness. does have occasional swelling of legs in evenings but it goes away after resting.   mood swings:  has noticed for about 2-3 weeks.  will one minute have a very short fuse and snap on people and later will cry for no reason.  states she feels restless and like her mind won't shut down - this has caused some trouble with insomnia.  she denies however any problems with inhibition - no spending excessive money, rearranging furniture, etc.  she does think this has gotten worse due to being stuck in the house during the cold winter.  she denies guilt over anything but has had multiple stressors - most recently sending her son to live in a shelter home in [place].  she endorces decreased energy and trouble concentrating at times.  she also endorces a low appetite.  she denies any suididal ideation.  she has had to be on an  antidepressant (paxil) in the past and gone to therapy both of which she felt helped.   Habits & Providers  Alcohol-Tobacco-Diet     Tobacco Status: current     Tobacco Counseling: to quit use of tobacco products     Cigarette Packs/Day: 0.75  Current Medications (verified): 1)  Hydrochlorothiazide 25 Mg Tabs (Hydrochlorothiazide) .Marland Kitchen.. 1 By Mouth Once Daily For Blood Pressure 2)  Catapres 0.1 Mg  Tabs (Clonidine Hcl) .Marland Kitchen.. 1 Tab By Mouth Two Times A Day.  Do Not Miss Doses 3)  Adult Aspirin Ec Low Strength 81 Mg  Tbec (Aspirin) .Marland Kitchen.. 1 Tab Daily 4)  Nitroglycerin 0.4 Mg  Subl (Nitroglycerin) .... Place One Under Tongue For Chest Pain 5)  Toprol Xl 50 Mg Xr24h-Tab (Metoprolol Succinate) .Marland Kitchen.. 1 Tab By Mouth Daily. 6)  Miralax  Powd (Polyethylene Glycol 3350) .... Use As Directed 7)  Pravastatin Sodium 40 Mg Tabs (Pravastatin Sodium) .... 2 By Mouth At Bedtime For Cholesterol 8)  Citalopram Hydrobromide 40 Mg Tabs (Citalopram Hydrobromide) .... Titrate Up As Directed To Goal 1 Tablet Daily  Allergies (verified): 1)  ! 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, trichomonas vaginalis treated 05/2005  left-sided colon cancer, T2 lesion, status post resection in 2008 did not need chemotherapy and radiation  HEADACHE (ICD-784.0) POSTMENOPAUSAL SYNDROME (ICD-627.9) FATIGUE (ICD-780.79) COUGH (ICD-786.2) PAIN IN JOINT OTHER SPECIFIED SITES (ICD-719.48) COPD UNSPECIFIED (ICD-496) SEXUAL ACTIVITY, HIGH RISK (ICD-V69.2) CONSTIPATION (ICD-564.00) PERSONAL HX COLON CANCER (ICD-V10.05) CANCER, COLORECTAL (ICD-154.0) TOBACCO ABUSE (ICD-305.1) MITRAL VALVE PROLAPSE, HX OF (ICD-V12.50) CAD (ICD-414.00) DEPRESSIVE DISORDER, MAJOR, RCR, MILD  (ICD-296.31) HYPERCHOLESTEROLEMIA (ICD-272.0) GERD (ICD-530.81) CEREBRAL ANEURYSM (ICD-437.3) HYPERTENSION, BENIGN ESSENTIAL (ICD-401.1)  Social History: Reviewed history from 07/15/2006 and no changes required. 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  Monogamous relationship 6 mos.  ; Smoking at up to 1 ppd since age 61 ; Uses EtOH in moderation. No illicit drugs.  Drinks lots of caffeine.; Has 3 grown children but one has had some trouble financially.  Packs/Day:  0.75  Review of Systems       per HPI.    Physical Exam  General:  Vital signs reviewed Well-developed, well-nourished patient in NAD.  Awake, cooperative.  Lungs:  Normal respiratory effort, chest expands symmetrically. Lungs are clear to auscultation, no crackles or wheezes. Heart:  Normal rate and regular rhythm. S1 and S2 normal without gallop, murmur, click, rub or other extra sounds. Extremities:  no edema Psych:  anxious, depressed and stressed appearing. Oriented X3 and good eye contact.     Impression & Recommendations:  Problem # 1:  HYPERTENSION, BENIGN ESSENTIAL (ICD-401.1) Assessment Unchanged  no changes at this time to regimen.  monitor closely.  encouraged compliance of medication to continue.  rereferral made to [facility] for aneurysm.   Her updated medication list for this problem includes:    Hydrochlorothiazide 25 Mg Tabs (Hydrochlorothiazide) .Marland Kitchen... 1 by mouth once daily for blood pressure    Catapres 0.1 Mg Tabs (Clonidine hcl) .Marland Kitchen... 1 tab by mouth two times a day.  do not miss doses    Toprol Xl 50 Mg Xr24h-tab (Metoprolol succinate) .Marland Kitchen... 1 tab by mouth daily.  Orders: [facility]- Est  Level 4 (99214)  BP today: 141/74 Prior BP: 126/80 (06/25/2009)  Labs Reviewed: K+: 4.2 (04/23/2009) Creat: : 0.83 (04/23/2009)   Chol: 225 (05/07/2008)   HDL: 45 (05/07/2008)   LDL: 154 (05/07/2008)   TG: 128 (05/07/2008)  Problem # 2:  DEPRESSIVE DISORDER, MAJOR,  RCR, MILD (ICD-296.31) Assessment: Deteriorated concerned about reemergence of this.  spend >10 minutes on counseling on different treatment options available including: nothing therapy therapy + meds meds  she has chosen to likely try therapy + meds.  given new script for citalopram.  given card for dr Pascal Lux to call to try to set up therapy.  f/u in 1 month  Orders: [facility]- Est  Level 4 (04540)  Problem # 3:  CONSTIPATION (ICD-564.00) Assessment: Deteriorated encouraged use of dulcolax suppository as needed, increase miralax to two times a day until regular again and then once daily therafter to keep regular.   The following medications were removed from the medication list:    Metamucil 30.9 % Powd (Psyllium) .Marland Kitchen... As needed Her updated medication list for this problem includes:    Miralax Powd (Polyethylene glycol 3350) ..... Use as directed  Orders: [facility]- Est  Level 4 (98119)  Complete Medication List: 1)  Hydrochlorothiazide 25 Mg Tabs (Hydrochlorothiazide) .Marland Kitchen.. 1 by mouth once daily for blood pressure 2)  Catapres 0.1 Mg Tabs (Clonidine hcl) .Marland Kitchen.. 1 tab  by mouth two times a day.  do not miss doses 3)  Adult Aspirin Ec Low Strength 81 Mg Tbec (Aspirin) .Marland Kitchen.. 1 tab daily 4)  Nitroglycerin 0.4 Mg Subl (Nitroglycerin) .... Place one under tongue for chest pain 5)  Toprol Xl 50 Mg Xr24h-tab (Metoprolol succinate) .Marland Kitchen.. 1 tab by mouth daily. 6)  Miralax Powd (Polyethylene glycol 3350) .... Use as directed 7)  Pravastatin Sodium 40 Mg Tabs (Pravastatin sodium) .... 2 by mouth at bedtime for cholesterol 8)  Citalopram Hydrobromide 40 Mg Tabs (Citalopram hydrobromide) .... Titrate up as directed to goal 1 tablet daily  Other Orders: Neurology Referral (Neuro) Future Orders: Lipid-[facility] ([id number]) ... 07/17/2010  Patient Instructions: 1)  Please schedule an appt for lab work one morning when you've had nothing to eat or drink except water and your daily medicines. 2)  Please schedule  an appt with Dr Pascal Lux for some therapy. 3)  Please schedule an appt with me in about 4 weeks.  4)  Start taking the medication daily.  Start with 1/2 tablet in the morning for 1 week and then 1 full tablet daily thereafter.  5)  Please schedule your mammogram.  Prescriptions: CITALOPRAM HYDROBROMIDE 40 MG TABS (CITALOPRAM HYDROBROMIDE) titrate up as directed to goal 1 tablet daily  #30 x 1   Entered and Authorized by:   Ancil Boozer  MD   Signed by:   Ancil Boozer  MD on 08/12/2009   Method used:   Electronically to        [facility] [address] (retail)       [address]       [place], [place]  [address]       Ph: [phone]       Fax: [phone]   RxID:   [id number]   Prevention & Chronic Care Immunizations   Influenza vaccine: Fluvax 3+  (04/23/2009)   Influenza vaccine due: 02/12/2009    Tetanus booster: 01/16/2005: Done.   Tetanus booster due: 01/17/2015    Pneumococcal vaccine: Not documented    H. zoster vaccine: Not documented  Colorectal Screening   Hemoccult: Not documented   Hemoccult due: Not Indicated    Colonoscopy: Location:  [facility].    (02/29/2008)   Colonoscopy due: 03/01/2011  Other Screening   Pap smear: normal  (06/29/2007)   Pap smear due: 06/28/2010    Mammogram: normal  (07/08/2007)   Mammogram due: 07/2008    DXA bone density scan: Not documented   Smoking status: current  (08/12/2009)   Smoking cessation counseling: yes  (05/21/2009)   Target quit date: 06/11/2009  (05/21/2009)  Lipids   Total Cholesterol: 225  (05/07/2008)   LDL: 154  (05/07/2008)   LDL Direct: 151  (04/23/2009)   HDL: 45  (05/07/2008)   Triglycerides: 128  (05/07/2008)    SGOT (AST): 12  (04/23/2009)   SGPT (ALT): <8 U/L  (04/23/2009)   Alkaline phosphatase: 119  (04/23/2009)   Total bilirubin: 0.5  (04/23/2009)    Lipid flowsheet reviewed?: Yes   Progress toward LDL goal: Unchanged  Hypertension   Last Blood  Pressure: 141 / 74  (08/12/2009)   Serum creatinine: 0.83  (04/23/2009)   Serum potassium 4.2  (04/23/2009)    Hypertension flowsheet reviewed?: Yes   Progress toward BP goal: Unchanged  Self-Management Support :   Personal Goals (by the next clinic visit) :      Personal blood pressure goal:  140/90  (01/31/2009)     Personal LDL goal: 130  (01/31/2009)    Hypertension self-management support: BP self-monitoring log, Written self-care plan, Education handout  (04/23/2009)    Hypertension self-management support not done because: Good outcomes  (08/12/2009)    Lipid self-management support: Written self-care plan, Education handout, Pre-printed educational material  (04/23/2009)     Lipid self-management support not done because: Refused  (08/12/2009)    Self-management comments: doesn't want to adjust meds further at this point

## 2010-06-19 NOTE — Assessment & Plan Note (Signed)
Summary: rov   Visit Type:  rov Referring Provider:  n/a Primary Provider:  Alvia Grove DO  CC:  sob.....chest pressure at times...denies any edema...pt states she found out in Oct/2011 she has lung cancer..pt is going to have surgery to remove lung tumor no date has been set yet  and waiting for cardiology clearance.  History of Present Illness: Ms Lochridge comes in today for preoperative clearance for a left upper lobectomy or weight resection for adenocarcinoma of the lung. She is sent by Dr. Laneta Simmers.  She has a history of nonobstructive coronary disease. She is having no angina. She does have dyspnea on exertion which is her lung disease. Stress Myoview June 06, 2009 showed EF 64% with no ischemia.  She continues to smoke. She is trying to stop using Chantix.  Clinical Reports Reviewed:  Nuclear Study:  06/06/2009:  Excerise capacity: Lexiscan  Blood Pressure response: Normal blood pressure response  Clinical symptoms: Dyspnea  ECG impression: No siginifcant ST segment change suggestive of ischemia  Overall impression: Normal stress nuclear study  Overall Impression Comments: Normal.  Colon Branch, MD, [facility]   12/22/2007:  Excerise capacity: Adenosine with low level exercise  Blood Pressure response:  Clinical symptoms:  ECG impression: No significant ST segment change suggestive of ischemia  Overall impression: There is probable breat attenuation but no sign of scar or ischemia  Olga Millers, MD   02/03/2007:  Excerise capacity:  Blood Pressure response:  Clinical symptoms: Significant shortness of breath. Treated with albuterol.  ECG impression: No significant ST segment change suggestive of ischemia  Overall impression: Mild thinning in the disatl anterior Luvern Mischke consistent with probable soft tissue attenuation (breast) and otherwise normal perfusion. Overall low risk scan.  PaulaRoss, MD   Current Medications (verified): 1)   Hydrochlorothiazide 25 Mg Tabs (Hydrochlorothiazide) .Marland Kitchen.. 1 By Mouth Once Daily For Blood Pressure 2)  Catapres 0.1 Mg  Tabs (Clonidine Hcl) .... One At Bedtime 3)  Adult Aspirin Ec Low Strength 81 Mg  Tbec (Aspirin) .Marland Kitchen.. 1 Tab Daily 4)  Nitroglycerin 0.4 Mg  Subl (Nitroglycerin) .... Place One Under Tongue For Chest Pain 5)  Pravastatin Sodium 80 Mg Tabs (Pravastatin Sodium) .Marland Kitchen.. 1 By Mouth At Bedtime For Cholesterol 6)  Citalopram Hydrobromide 40 Mg Tabs (Citalopram Hydrobromide) .Marland Kitchen.. 1 By Mouth Once Daily For Depression/anxiety 7)  Bystolic 10 Mg  Tabs (Nebivolol Hcl) .... One Tablet Daily 8)  Chantix Starting Month Pak 0.5 Mg X 11 & 1 Mg X 42 Tabs (Varenicline Tartrate) .... Start Pack Tomorrow Morning  Allergies: 1)  ! Lisinopril  Past History:  Past Medical History: Last updated: 04/28/2010 PET scan 04-28-10: primary  Lung nodule noted on CT, most recent shows increase in size (02/2010)      - FNA ordered for 04/02/10 by [facility]> pos Ca  chronic folliculitis of groin,  fibrocystic breast changes,  h/o hypokalemia,  h/o syncope--most likely vasovagal etiology,  Left chest/ axillary line nevus (1x0.5x0.5cm 1/05),  mitral valve prolapse,  Non-obstructive CAD,  trichomonas vaginalis treated 05/2005 left-sided colon cancer, T2 lesion, status post resection in 2008 did not need chemotherapy and radiation POSTMENOPAUSAL SYNDROME (ICD-627.9) COPD UNSPECIFIED (ICD-496) 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,       - PFT's April 09, 2010  FEV1 1.3 (63%) with ratio 82% TOBACCO ABUSE (ICD-305.1) DEPRESSIVE DISORDER, MAJOR, RCR, MILD (ICD-296.31) - has done well with addition of SSRI + counseling from Dr Pascal Lux HYPERCHOLESTEROLEMIA (ICD-272.0) GERD (ICD-530.81) CEREBRAL ANEURYSM (ICD-437.3) - see notes scanned in  from [facility] HYPERTENSION, BENIGN ESSENTIAL (ICD-401.1) back pain with h/o herniated disc  2011  Past Surgical History: Last updated: 10/29/2009 left-sided colon  cancer resection, 2008.xc lumbar spine surgery for herniated disc 2011   Family History: Last updated: 07/15/2006 Cousin w/breast CA, sister w/lymphatic CA., Father--living w/ OA, gout, HTN, HTN runs in family, Mother died of heart dz/enlarged heart/pericarditis at 62 yo., No FHx of DM  Social History: Last updated: 03/13/2010 Widowed; 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  ; Smoking at up to 2 ppd since age 61 ; Uses EtOH in moderation. No illicit drugs.  Drinks lots of caffeine.; Has 3 grown children but one has had some trouble financially.  One son lives locally, other 2 children are in other states  Risk Factors: Smoking Status: quit (04/28/2010) Packs/Day: 2.0 (04/28/2010) Passive Smoke Exposure: yes (04/28/2010)  Review of Systems       negative other than history of present illness  Vital Signs:  Patient profile:   60 year old female Height:      64 inches Weight:      152.25 pounds BMI:     26.23 Pulse rate:   62 / minute Pulse rhythm:   irregular BP sitting:   120 / 62  (left arm) Cuff size:   large  Vitals Entered By: Danielle Rankin, CMA (May 14, 2010 4:11 PM)  Physical Exam  General:  Well developed, well nourished, in no acute distress. Head:  normocephalic and atraumatic Eyes:  PERRLA/EOM intact; conjunctiva and lids normal. Neck:  Neck supple, no JVD. No masses, thyromegaly or abnormal cervical nodes. Lungs:  inspiratory expiratory rhonchi. Heart:  PMI nondisplaced, normal S1-S2, no murmur, no carotid bruit Msk:  Back normal, normal gait. Muscle strength and tone normal. Pulses:  pulses normal in all 4 extremities Extremities:  No clubbing or cyanosis. Neurologic:  Alert and oriented x 3. Skin:  Intact without lesions or rashes. Psych:  Normal affect.   Impression & Recommendations:  Problem # 1:  CAD (ICD-414.00) I have cleared for surgery at low cardiovascular risk. Advised to stop smoking prior to surgery to  help avoid postoperative pulmonary complications after surgery. She needs to quit altogether as she knows. Her updated medication list for this problem includes:    Adult Aspirin Ec Low Strength 81 Mg Tbec (Aspirin) .Marland Kitchen... 1 tab daily    Nitroglycerin 0.4 Mg Subl (Nitroglycerin) .Marland Kitchen... Place one under tongue for chest pain    Bystolic 10 Mg Tabs (Nebivolol hcl) ..... One tablet daily  Orders: EKG w/ Interpretation (93000)  Problem # 2:  TOBACCO ABUSE (ICD-305.1) Assessment: Unchanged strongly advised to quit  Patient Instructions: 1)  Your physician recommends that you schedule a follow-up appointment in: 1 year with Dr. Daleen Squibb 2)  Your physician recommends that you continue on your current medications as directed. Please refer to the Current Medication list given to you today.

## 2010-06-19 NOTE — Assessment & Plan Note (Signed)
Summary: Pulmonary comprehensive eval for copd/cough/MPN   Copy to:  n/a Primary Provider/Referring Provider:  Alvia Grove DO  CC:  Cough- the same.  History of Present Illness: 29 yobf still smoking with prev eval for cough c/w ace dc'd  5/08  May 24, 2008 ov fine until xmas eve, chills and bad cough aches in left chest and abd eval in [facility]  rx with cough drops only,  no more chills at ov, still bad cough and hoarseness assoc with head congestion but no purulent secretions.  rec 1)  Stop the cigarettes before they stop you 2)  GERD (gastroesophageal reflux disease) was discussed. It is a common cause of respiratory symptoms. It commonly presents in the absence of heartburn. GERD can be treated with medication, but also with lifestyle changes including avoidance of late meals, excessive alcohol, smoking cessation, and avoid fatty foods, chocolate, peppermint, colas, red wine, and acidic juices such as orange juice. NO MINT OR MENTHOL PRODUCTS(no cough drops) 3)  USE SUGARLESS CANDY INSTEAD (jolley ranchers)  4)  Aciphex Take one 30-60 min before first and last meals of the day x 2 weeks only 5)  Take delsym two tsp every 12 hours and add tramadol 50 mg up to every 4 hours  March 26, 2010 ov cough x years,  more productive for about a year esp p supper but also when lie down but not really keeping her up or waking her up in am, mucus is thick, white never bloody. using lots of mints and using mints. not clear she followed all the prev instrucitons but still smoking and CT suggests increase in size of LUL nodule but still not viz on cxr > CT bx ordered by [facility] for 11/16. Loosing appetite, wt, depressed.   Pt denies any significant sore throat, dysphagia, itching, sneezing,  nasal congestion or excess secretions,  fever, chills, sweats,   pleuritic or exertional cp, , change in activity tolerance  orthopnea pnd or leg swelling Pt also denies any obvious fluctuation in symptoms with weather  or environmental change or other alleviating or aggravating factors.           Preventive Screening-Counseling & Management  Alcohol-Tobacco     Smoking Status: current     Smoking Cessation Counseling: yes  Current Medications (verified): 1)  Hydrochlorothiazide 25 Mg Tabs (Hydrochlorothiazide) .Marland Kitchen.. 1 By Mouth Once Daily For Blood Pressure 2)  Catapres 0.1 Mg  Tabs (Clonidine Hcl) .Marland Kitchen.. 1 Tab By Mouth Two Times A Day.  Do Not Miss Doses 3)  Adult Aspirin Ec Low Strength 81 Mg  Tbec (Aspirin) .Marland Kitchen.. 1 Tab Daily 4)  Nitroglycerin 0.4 Mg  Subl (Nitroglycerin) .... Place One Under Tongue For Chest Pain 5)  Toprol Xl 50 Mg Xr24h-Tab (Metoprolol Succinate) .Marland Kitchen.. 1 Tab By Mouth Daily. 6)  Pravastatin Sodium 80 Mg Tabs (Pravastatin Sodium) .Marland Kitchen.. 1 By Mouth At Bedtime For Cholesterol 7)  Citalopram Hydrobromide 40 Mg Tabs (Citalopram Hydrobromide) .Marland Kitchen.. 1 By Mouth Once Daily For Depression/anxiety  Allergies (verified): 1)  ! 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,  trichomonas vaginalis treated 05/2005 left-sided colon  cancer, T2 lesion, status post resection in 2008 did not need chemotherapy and radiation POSTMENOPAUSAL SYNDROME (ICD-627.9) COPD UNSPECIFIED (ICD-496) TOBACCO ABUSE (ICD-305.1) DEPRESSIVE DISORDER, MAJOR, RCR, MILD (ICD-296.31) - has done well with addition of SSRI + counseling from Dr Pascal Lux HYPERCHOLESTEROLEMIA (ICD-272.0) GERD (ICD-530.81) CEREBRAL ANEURYSM (ICD-437.3) - see notes scanned in from [facility] HYPERTENSION, BENIGN ESSENTIAL (ICD-401.1) back pain with h/o herniated disc   2011  Family History: Reviewed history from 07/15/2006 and no changes required. Cousin w/breast CA, sister w/lymphatic CA., Father--living w/ OA, gout, HTN, HTN runs in family, Mother died of heart dz/enlarged heart/pericarditis at 62 yo., No FHx of DM  Social History: Reviewed history from 03/13/2010 and no changes required. 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  ; Smoking at up to 2 ppd since age 60 ; Uses EtOH in moderation. No illicit drugs.  Drinks lots of caffeine.; Has 3 grown children but one has had some trouble financially.  One son lives locally, other 2 children are in other states  Review of Systems  The patient denies fever, weight gain, vision loss, decreased hearing, hoarseness, chest pain, syncope, peripheral edema, headaches, hemoptysis, abdominal pain, melena, hematochezia, severe indigestion/heartburn, and depression.    Vital Signs:  Patient profile:   60 year old female Weight:      148 pounds O2 Sat:      98 % on Room air Temp:     97.8 degrees F oral Pulse rate:   54 / minute BP sitting:   112 / 74  (left arm)  Vitals Entered By: Vernie Murders (March 26, 2010 8:46 AM)  O2 Flow:  Room air  Physical Exam  Additional Exam:  wt 148 > 148 March 26, 2010 HEENT mild turbinate edema.  Oropharynx no thrush or excess pnd or cobblestoning.  No JVD or cervical adenopathy. Mild accessory muscle hypertrophy. Trachea midline, nl thryroid. Chest was hyperinflated by percussion with diminished breath sounds and moderate increased exp time without wheeze. Hoover sign positive at mid inspiration. Regular rate and rhythm without murmur gallop or rub or increase P2 or edema.  Abd: no hsm, nl excursion. Ext warm without cyanosis or clubbing.     Impression & Recommendations:  Problem # 1:  COPD UNSPECIFIED (ICD-496) Predominantly manifested by cough >> sob    DDX of  difficult airways managment all start with A and  include  Adherence, Ace Inhibitors, Acid Reflux, Active Sinus Disease, Alpha 1 Antitripsin deficiency, Anxiety masquerading as Airways dz,  ABPA,  allergy(esp in young), Aspiration (esp in elderly), Adverse effects of DPI,  Active smokers, plus one B  = Beta blocker use..  and one C =CHF  Active smoking greatest concern, see #2  ? Acid reflux:  trial of ppi, h1hs and diet reviewed in detail  ? Sinus dz :  sinus ct next  Problem # 2:  TOBACCO ABUSE (ICD-305.1)   I emphasized that although we never turn away smokers from the pulmonary clinic, we do ask that they understand that the recommendations that were made won't work nearly as well in the presence of continued cigarette exposure and we may reach a point where we can't help the patient if he/she can't quit smoking.    Orders: Est. Patient Level V (04540)  Problem # 3:  PULMONARY NODULE (ICD-518.89)  Although there are clearly abnormalities on CT scan, they should probably be considered "microscopic" since not obvious on plain cxr .  In the setting of obvious "macroscopic" health issues I am very reluctatnt to embark on an invasive w/u at this point but will defer to oncoology.  I don't see any benefit to early dx of met dz but it may well be that she has a very early stage I bronchogenic ca in LUL.  Orders: Est. Patient Level V (09811)  Medications Added to Medication List This Visit: 1)  Catapres 0.1 Mg Tabs (Clonidine hcl) .... One at bedtime 2)  Bystolic 10 Mg Tabs (Nebivolol hcl) .... One tablet daily 3)  Dexilant 60 Mg Cpdr (Dexlansoprazole) .... Take one by mouth daily 4)  Pepcid 20 Mg Tabs (Famotidine) .... Take one by mouth at bedtime  Patient Instructions: 1)  Stop smoking completely, it's the most important aspect of your care 2)  Stop toprol and am dose of clonidine 3)  Bystolic 10mg  one daily 4)  Dexilant 60 Take  one 30-60 min before first meal of the day and pepcid 20 mg at bedtim 5)  GERD (REFLUX)  is a common cause of  respiratory symptoms. It commonly presents without heartburn and can be treated with medication, but also with lifestyle changes including avoidance of late meals, excessive alcohol, smoking cessation, and avoid fatty foods, chocolate, peppermint, colas, red wine, and acidic juices such as orange juice. NO MINT OR MENTHOL PRODUCTS SO NO COUGH DROPS  6)  USE SUGARLESS CANDY INSTEAD (jolley ranchers)  7)  NO OIL BASED VITAMINS 8)  Please schedule a follow-up appointment in 2 weeks, sooner if needed

## 2010-06-19 NOTE — Letter (Signed)
Summary: [facility]  [facility]   Imported By: Sherian Rein 05/06/2010 07:54:01  _____________________________________________________________________  External Attachment:    Type:   Image     Comment:   External Document

## 2010-06-19 NOTE — Progress Notes (Signed)
Summary: Nuclear Pre-Procedure  Phone Note Outgoing Call   Call placed by: Milana Na, EMT-P,  June 04, 2009 4:13 PM Summary of Call: Reviewed information on [facility] Information Sheet (see scanned document for further details).  Spoke with patient.     Nuclear Med Background Indications for Stress Test: Evaluation for Ischemia, Post Hospital  Indications Comments: 06/03/09 CP (-) enzymes  History: COPD, Heart Catheterization, Myocardial Perfusion Study  History Comments: 02/06 Heart Cath EF>60% N/O CAD  08/09 MPS EF 59% (-) scar (-) ischemia   Symptoms: Chest Pain  Symptoms Comments: CP goes thru to her back   Nuclear Pre-Procedure Cardiac Risk Factors: Hypertension, Lipids, Smoker Height (in): 64  Nuclear Med Study Referring MD:  T.Wall

## 2010-06-19 NOTE — Consult Note (Signed)
[facility]   Imported By: Roderic Ovens 06/10/2009 12:18:26  _____________________________________________________________________  External Attachment:    Type:   Image     Comment:   External Document

## 2010-06-19 NOTE — Progress Notes (Signed)
Summary: BLOOD IN MUCUS  Phone Note Call from Patient   Caller: Patient Call For: WERT Summary of Call: PT JUST LEFT OV AND WHEN SHE GOT HOME SHE COUGHED UP SOME MUCUS AND IT HAD BLOOD IN IT. Initial call taken by: Rickard Patience,  April 09, 2010 11:58 AM  Follow-up for Phone Call        Pt was seen today. She stats when she arrived home she started coughing and the phlegm was clear with some blood mixed in it. She staes it was a very small amount. Pt just wanted to let Dr. Sherene Sires know about this. Carron Curie CMA  April 09, 2010 12:06 PM not surprised, no change in recs (tumors tend to bleed) if gets much worse, say a cup of blood, should go to ER Follow-up by: Nyoka Cowden MD,  April 09, 2010 3:47 PM  Additional Follow-up for Phone Call Additional follow up Details #1::        Pt advised.Carron Curie CMA  April 09, 2010 3:50 PM

## 2010-06-19 NOTE — Letter (Signed)
Summary: [facility] Call Report  [facility]   Imported By: Roderic Ovens 06/26/2009 10:48:23  _____________________________________________________________________  External Attachment:    Type:   Image     Comment:   External Document

## 2010-06-19 NOTE — Assessment & Plan Note (Signed)
Summary: F/U  KH   Vital Signs:  Patient profile:   60 year old female Height:      64 inches Weight:      148.13 pounds BMI:     25.52 BSA:     1.72 Temp:     98.6 degrees F Pulse rate:   54 / minute BP sitting:   127 / 72  Vitals Entered By: Jone Baseman CMA (March 13, 2010 1:36 PM) CC: f/u Is Patient Diabetic? No Pain Assessment Patient in pain? no        Primary Care Provider:  Alvia Grove DO  CC:  f/u.  History of Present Illness: 60 yo female, intial appt was for f/u of last OV, however, pt states today that she had a recent repeat CT of her chest and her pulm doctor is concerned she may have cancer.  She is very concerned about this and would like to know what the next steps are and what to expect in regards to outcomes and complications. Pt reports continuing to smoke despite recent imagining that suggest ? lung cancer.  Pt is still not interested in quitting at this time.  Has smoked since the 60's and is up to 2 PPD.  Does admit to SOB at times but not to the point where she feeling quitting would make a difference.   Is scheduled for biopsy of lung nodule on November 16th at 0930, procedure at 11am, at [facility].   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  (ICD-V02.54) 3)  Back Pain With Radiculopathy  (ICD-729.2) 4)  Weight Loss, Recent  (ICD-783.21) 5)  Postmenopausal Syndrome  (ICD-627.9) 6)  COPD Unspecified  (ICD-496) 7)  Sexual Activity, High Risk  (ICD-V69.2) 8)  Constipation  (ICD-564.00) 9)  Personal Hx Colon Cancer  (ICD-V10.05) 10)  Tobacco Abuse  (ICD-305.1) 11)  Mitral Valve Prolapse, Hx of  (ICD-V12.50) 12)  Cad   (ICD-414.00) 13)  Depressive Disorder, Major, Rcr, Mild  (ICD-296.31) 14)  Hypercholesterolemia  (ICD-272.0) 15)  Gerd  (ICD-530.81) 16)  Cerebral Aneurysm  (ICD-437.3) 17)  Hypertension, Benign Essential  (ICD-401.1)  Current Medications (verified): 1)  Hydrochlorothiazide 25 Mg Tabs (Hydrochlorothiazide) .Marland Kitchen.. 1 By Mouth Once Daily For Blood Pressure 2)  Catapres 0.1 Mg  Tabs (Clonidine Hcl) .Marland Kitchen.. 1 Tab By Mouth Two Times A Day.  Do Not Miss Doses 3)  Adult Aspirin Ec Low Strength 81 Mg  Tbec (Aspirin) .Marland Kitchen.. 1 Tab Daily 4)  Nitroglycerin 0.4 Mg  Subl (Nitroglycerin) .... Place One Under Tongue For Chest Pain 5)  Toprol Xl 50 Mg Xr24h-Tab (Metoprolol Succinate) .Marland Kitchen.. 1 Tab By Mouth Daily. 6)  Miralax  Powd (Polyethylene Glycol 3350) .... Use As Directed 7)  Pravastatin Sodium 80 Mg Tabs (Pravastatin Sodium) .Marland Kitchen.. 1 By Mouth At Bedtime For Cholesterol 8)  Citalopram Hydrobromide 40 Mg Tabs (Citalopram Hydrobromide) .Marland Kitchen.. 1 By Mouth Once Daily For Depression/anxiety 9)  Mupirocin 2 % Oint (Mupirocin) .... As Needed.  Allergies (verified): 1)  ! 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  ; Smoking at up to 2 ppd since age 80 ; Uses EtOH in moderation. No illicit drugs.  Drinks lots of caffeine.; Has 3 grown children but one has had some trouble financially.  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,  trichomonas vaginalis treated 05/2005 left-sided colon cancer, T2 lesion, status post resection in 2008 did not need chemotherapy and radiation POSTMENOPAUSAL SYNDROME (ICD-627.9) COPD UNSPECIFIED (ICD-496) TOBACCO ABUSE (ICD-305.1) DEPRESSIVE DISORDER, MAJOR, RCR, MILD (ICD-296.31) - has done well with addition of SSRI + counseling from Dr Pascal Lux HYPERCHOLESTEROLEMIA (ICD-272.0) GERD (ICD-530.81) CEREBRAL ANEURYSM (ICD-437.3) - see notes scanned in from [facility] HYPERTENSION, BENIGN ESSENTIAL (ICD-401.1) back pain with h/o herniated disc  2011  Social History: Widowed; 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  ; Smoking at up to 2 ppd since age 15 ; Uses EtOH in moderation. No illicit drugs.  Drinks lots of caffeine.; Has 3 grown children but one has had some trouble financially.  One son lives locally, other 2 children are in other statesPacks/Day:  2.0  Review of Systems       see hpi for full ROS  Physical Exam  General:  Vital signs reviewed Well-developed, well-nourished patient in NAD.  Awake, cooperative.  Lungs:  Normal respiratory effort, chest expands symmetrically. Lungs are clear to auscultation, no crackles or wheezes. Heart:  Normal rate and regular rhythm. S1 and S2 normal without gallop, murmur, click, rub or other extra sounds. Abdomen:  soft, non-tender, and normal bowel sounds.  no distention, no masses, and no guarding.   Neurologic:  alert & oriented X3.   Psych:  Oriented X3, memory intact for recent and remote, good eye contact, not anxious  appearing, not depressed appearing, not suicidal, and not homicidal.     Impression & Recommendations:  Problem # 1:  PULMONARY NODULE (ICD-518.89) Worsening per CT of chest, nodule is increasing in size. Plan is to have a biopsy done at [facility] on 11-16. Counseled pt > 30 minutes in regards to nodule and biopsy, goals of care and long term plans.  Discussed her emotional needs and support that she currently has.  Pt feels that she has good support locally and is very involved with her church.  Religion is very important to her.  As is praying and having faith that God will see her through this difficult time. Discussed outcomes that would be unacceptable to her, she clearly states she is unwilling to ever depend on anyone else and that she would rather die than lose her independance.   Discussed in depth her expectations and the possible need to readjust those and her openess and willingness to consider this.  She will continue to think about  it.   Feels she has good support through the clinic here, especially with her relationships with Dr. Pascal Lux, Dennison Nancy and Babs.  Feels she can go to these people for support, which is very important to her.    Orders: [facility]- Est  Level 4 (16109)  Problem # 2:  TOBACCO ABUSE (ICD-305.1) Assessment: Deteriorated Now smoking 2 PPD.  Not ready to quit despite clear consequences. Orders: [facility]- Est  Level 4 (99214)  Problem # 3:  PERSONAL HX COLON CANCER (ICD-V10.05) Assessment: Unchanged  Orders: [facility]- Est  Level 4 (60454)  Problem # 4:  DEPRESSIVE DISORDER, MAJOR, RCR, MILD (ICD-296.31) as #1, will f/u with Dr. Pascal Lux soon  Complete Medication List: 1)  Hydrochlorothiazide 25 Mg Tabs (Hydrochlorothiazide) .Marland Kitchen.. 1 by mouth once daily for blood pressure 2)  Catapres 0.1 Mg Tabs (Clonidine hcl) .Marland Kitchen.. 1 tab by mouth two times a day.  do not miss doses 3)  Adult Aspirin Ec Low Strength 81 Mg Tbec (Aspirin) .Marland Kitchen.. 1 tab daily 4)  Nitroglycerin 0.4 Mg Subl  (Nitroglycerin) .... Place one under tongue for chest pain 5)  Toprol Xl 50 Mg Xr24h-tab (Metoprolol succinate) .Marland Kitchen.. 1 tab by mouth daily. 6)  Miralax Powd (Polyethylene glycol 3350) .... Use as directed 7)  Pravastatin Sodium 80 Mg Tabs (Pravastatin sodium) .Marland Kitchen.. 1 by mouth at bedtime for cholesterol 8)  Citalopram Hydrobromide 40 Mg Tabs (Citalopram hydrobromide) .Marland Kitchen.. 1 by mouth once daily for depression/anxiety 9)  Mupirocin 2 % Oint (Mupirocin) .... As needed.  Patient Instructions: 1)  Nice to see you today. 2)  I am so sorry about your news of a possible nodule in your lungs.  As we discussed, we don't know much uintil a biopsy can be completed.  Once that is done, a diagnosis should be able to be made.  This can be complicated and if the nodule is a tumor it will need to be staged and you will need to be evaluated for any other cancer.   3)  I am here to help and support you, as is the entire [facility], please let me know if I can do anything for you. 4)  I think it is a good idea to see Dr. Pascal Lux soon and touch base with her about whats going on. 5)  It will be important that you have good support and people around you that you can talk to.  6)  Consider talking to your children about this, if I can help facilitate that discussion, I am happy to.  7)  I will try and see you at the hospital when you go for your biopsy. 8)  If anything changes between now and then, please let me know.  9)  Please schedule a follow-up appointment as needed .    Orders Added: 1)  [facility]- Est  Level 4 [09811]

## 2010-06-19 NOTE — Consult Note (Signed)
Summary: [facility]  [facility]   Imported By: Clydell Hakim 01/08/2010 11:31:43  _____________________________________________________________________  External Attachment:    Type:   Image     Comment:   External Document

## 2010-06-19 NOTE — Consult Note (Signed)
Summary: [facility]  [facility]   Imported By: Clydell Hakim 10/17/2009 15:21:28  _____________________________________________________________________  External Attachment:    Type:   Image     Comment:   External Document

## 2010-06-19 NOTE — Miscellaneous (Signed)
Summary: patient summary  Clinical Lists Changes  Problems: Removed problem of BACK PAIN (ICD-724.5) Removed problem of HEADACHE (ICD-784.0) Removed problem of CANCER, COLORECTAL (ICD-154.0) Removed problem of PAIN IN JOINT OTHER SPECIFIED SITES (ICD-719.48) Removed problem of COUGH (ICD-786.2) Removed problem of FATIGUE (ICD-780.79) Medications: Removed medication of PREDNISONE 20 MG TABS (PREDNISONE) 2 tab by mouth once daily for 7 days for back pain Removed medication of CYCLOBENZAPRINE HCL 10 MG TABS (CYCLOBENZAPRINE HCL) 1/2 - 1 by mouth three times a day as needed muscle spasms Removed medication of TRAMADOL HCL 50 MG TABS (TRAMADOL HCL) 1 by mouth three times a day as needed severe pain Observations: Added new observation of PAST SURG HX: left-sided colon cancer resection, 2008.xc lumbar spine surgery for herniated disc 2011  (10/29/2009 11:31) Added new observation of PAST MED HX: 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,  trichomonas vaginalis treated 05/2005 left-sided colon cancer, T2 lesion, status post resection in 2008 did not need chemotherapy and radiation POSTMENOPAUSAL SYNDROME (ICD-627.9) COPD UNSPECIFIED (ICD-496) TOBACCO ABUSE (ICD-305.1) DEPRESSIVE DISORDER, MAJOR, RCR, MILD (ICD-296.31) - has done well with addition of SSRI + counseling from Dr Pascal Lux HYPERCHOLESTEROLEMIA (ICD-272.0) GERD (ICD-530.81) CEREBRAL ANEURYSM (ICD-437.3) - see notes scanned in from [facility] HYPERTENSION, BENIGN ESSENTIAL (ICD-401.1) back pain with h/o herniated disc  2011 (10/29/2009 11:31)      Past History:  Past Medical History: chronic folliculitis of groin,  fibrocystic breast changes,  h/o hypokalemia,  h/o syncope--most likely vasovagal  etiology,  Left chest/ axillary line nevus (1x0.5x0.5cm 1/05),  mitral valve prolapse,  Non-obstructive CAD,  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,  trichomonas vaginalis treated 05/2005 left-sided colon cancer, T2 lesion, status post resection in 2008 did not need chemotherapy and radiation POSTMENOPAUSAL SYNDROME (ICD-627.9) COPD UNSPECIFIED (ICD-496) TOBACCO ABUSE (ICD-305.1) DEPRESSIVE DISORDER, MAJOR, RCR, MILD (ICD-296.31) - has done well with addition of SSRI + counseling from Dr Pascal Lux HYPERCHOLESTEROLEMIA (ICD-272.0) GERD (ICD-530.81) CEREBRAL ANEURYSM (ICD-437.3) - see notes scanned in from [facility] HYPERTENSION, BENIGN ESSENTIAL (ICD-401.1) back pain with h/o herniated disc  2011  Past Surgical History: left-sided colon cancer resection, 2008.xc lumbar spine surgery for herniated disc 2011

## 2010-06-19 NOTE — Assessment & Plan Note (Signed)
Summary: Behavioral Medicine Follow-up   Primary Care Provider:  Ancil Boozer  MD   History of Present Illness: Lauren [name] reports she is doing well, despite the on-going back issues.  These were reviewed.  She believes that if God saw a way for her to make it through her colon cancer - this will certainly not derail her.  She denied significant anxiety over this issue.  She was pleased to report the relationship with Kevin Fenton is still over.  She has met a new "friend" named [name].  She is taking things slowly and is excited about the prospect.  [name] lives in [place] and works full-time with some photographer work on the side.  Allergies: 1)  ! Lisinopril   Impression & Recommendations:  Problem # 1:  DEPRESSIVE DISORDER, MAJOR, RCR, MILD (ICD-296.31) Assessment Improved  Report of mood is euthymic.  Affect is consistent.  Thoughts are clear and goal directed.  She feels empowered.  Seems to be approaching the back issue with a reasonable amount of caution.  She has strong religious beliefs about health and healing.  Thought process regarding the new relationship seems reasonable.  Relationships always involve a bit of risk.  The last one seemed significantly more risky.    Danajah elected to call to schedule an appt after things settle down with her back.  If the PCP notes any change in mental health, please let me know and I can make contact.    Orders: Therapy 40-50- min- [facility] ([address])  Complete Medication List: 1)  Hydrochlorothiazide 25 Mg Tabs (Hydrochlorothiazide) .Marland Kitchen.. 1 by mouth once daily for blood pressure 2)  Catapres 0.1 Mg Tabs (Clonidine hcl) .Marland Kitchen.. 1 tab by mouth two times a day.  do not miss doses 3)  Adult Aspirin Ec Low Strength 81 Mg Tbec (Aspirin) .Marland Kitchen.. 1 tab daily 4)  Nitroglycerin 0.4 Mg Subl (Nitroglycerin) .... Place one under tongue for chest pain 5)  Toprol Xl 50 Mg Xr24h-tab (Metoprolol succinate) .Marland Kitchen.. 1 tab by mouth daily. 6)  Miralax Powd (Polyethylene glycol  3350) .... Use as directed 7)  Simvastatin 80 Mg Tabs (Simvastatin) .Marland Kitchen.. 1 by mouth at bedtime for cholesterol. 8)  Citalopram Hydrobromide 40 Mg Tabs (Citalopram hydrobromide) .Marland Kitchen.. 1 by mouth once daily for depression/anxiety 9)  Prednisone 20 Mg Tabs (Prednisone) .... 2 tab by mouth once daily for 7 days for back pain 10)  Cyclobenzaprine Hcl 10 Mg Tabs (Cyclobenzaprine hcl) .... 1/2 - 1 by mouth three times a day as needed muscle spasms 11)  Tramadol Hcl 50 Mg Tabs (Tramadol hcl) .Marland Kitchen.. 1 by mouth three times a day as needed severe pain

## 2010-06-19 NOTE — Progress Notes (Signed)
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  Her symptoms are unchanged from her visit with me except for some swelling of her LEFT ankle.  She again denies bowel/bladder changes, saddle anesthesia.  Advised that I would attempt to have a follow up arranged with spinal surgeon for tomorrow or Friday.  She has an appointment with Dr. Sandi Mealy on Friday afternoon and another appointment Friday morning (unknown with who).  Will attempt to have appointment made for tomorrow or Friday afternoon.  If appointment can be made in that time frame, will cancel appointment with Dr. Sandi Mealy Friday afternoon.  Advised patient to keep appointment with Dr. Sandi Mealy unless she hears from our office.  Patient is agreeable to this plan.  Multiple questions answered for patient.  Red flags given--present to ED for new onset weakness, spreading numbness, bowel/bladder incontinence/retention, saddle anesthesia.  Follow-up for Phone Call        [facility] Team:  I've made referral to neurosurgery (spinal specialist) for this patient as she now has disk protruding onto nerves and is symptomatic:  numbness posterior leg and plantar surface of foot (LEFT side).  She needs to be seen Thursday or Friday.  If you can make appointment, okay to cancel her Friday appointment with Dr. Sandi Mealy.  Please call patient with appointment. Follow-up by: Romero Belling MD,  Oct 02, 2009 7:37 PM

## 2010-06-19 NOTE — Progress Notes (Signed)
  Phone Note Outgoing Call Call back at [facility] Phone [phone]   Call placed by: Ancil Boozer  MD,  Oct 04, 2009 2:27 PM Summary of Call: saw neurosurgeon.  he wanted to do surgery on monday for back. she requested some time to wait to think it over and to get a 2nd opinion for which the neurosurgeon is giving her 2 wks.   i encouraged her that she needs to call and get this 2nd opinion right away as i do not want her to wait on this issue given her symptoms.  she expressed understanding.  she also asked that I let Dr Constance Goltz know about these findings.  Initial call taken by: Ancil Boozer  MD,  Oct 04, 2009 2:32 PM

## 2010-06-19 NOTE — Assessment & Plan Note (Signed)
Summary: Pulmonary/ ext summary f/u ov   Copy to:  n/a Primary Katrese Shell/Referring Sundus Pete:  Alvia Grove DO  CC:  Cough - improved some.  Marland Kitchen  History of Present Illness: 60 yobf still smoking with prev eval for cough c/w ace dc'd  5/08  May 24, 2008 ov fine until xmas eve, chills and bad cough aches in left chest and abd eval in [facility]  rx with cough drops only,  no more chills at ov, still bad cough and hoarseness assoc with head congestion but no purulent secretions.  rec 1)  Stop the cigarettes before they stop you 2)  GERD diet 4)  Aciphex Take one 30-60 min before first and last meals of the day x 2 weeks only 5)  Take delsym two tsp every 12 hours and add tramadol 50 mg up to every 4 hours > improved and did not return for f/u  March 26, 2010 ov cough x years,  more productive for about a year esp p supper but also when lie down but not really keeping her up or waking her up in am, mucus is thick, white never bloody. using lots of mints and using mints. not clear she followed all the prev instrucitons but still smoking and CT suggests increase in size of LUL nodule but still not viz on cxr > CT bx ordered by [facility] for 11/16. Loosing appetite, wt, depressed,  Stop smoking completely, it's the most important aspect of your care Stop toprol and am dose of clonidine Bystolic 10mg  one daily Dexilant 60 Take  one 30-60 min before first meal of the day and pepcid 20 mg at bedtime  11/16 FNA Pos for ca .   April 09, 2010 ov cough better,  though still smoking, considering surgery. not limited by sob but very sedentary, some am sputum but not pululent and minimal hemoptysis. Pt denies any significant sore throat, dysphagia, itching, sneezing,  no wt loss since prev ov, no cp.       Preventive Screening-Counseling & Management  Alcohol-Tobacco     Smoking Status: current     Smoking Cessation Counseling: yes  Current Medications (verified): 1)  Hydrochlorothiazide 25 Mg Tabs  (Hydrochlorothiazide) .Marland Kitchen.. 1 By Mouth Once Daily For Blood Pressure 2)  Catapres 0.1 Mg  Tabs (Clonidine Hcl) .... One At Bedtime 3)  Adult Aspirin Ec Low Strength 81 Mg  Tbec (Aspirin) .Marland Kitchen.. 1 Tab Daily 4)  Nitroglycerin 0.4 Mg  Subl (Nitroglycerin) .... Place One Under Tongue For Chest Pain 5)  Pravastatin Sodium 80 Mg Tabs (Pravastatin Sodium) .Marland Kitchen.. 1 By Mouth At Bedtime For Cholesterol 6)  Citalopram Hydrobromide 40 Mg Tabs (Citalopram Hydrobromide) .Marland Kitchen.. 1 By Mouth Once Daily For Depression/anxiety 7)  Bystolic 10 Mg  Tabs (Nebivolol Hcl) .... One Tablet Daily 8)  Dexilant 60 Mg Cpdr (Dexlansoprazole) .... Take One By Mouth Daily  Allergies (verified): 1)  ! 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,       -  PFT's April 09, 2010  FEV1 1.3 (63%) with ratio 82% TOBACCO ABUSE (ICD-305.1) DEPRESSIVE DISORDER, MAJOR, RCR, MILD (ICD-296.31) - has done well with addition of SSRI + counseling from Dr Pascal Lux HYPERCHOLESTEROLEMIA (ICD-272.0) GERD (ICD-530.81) 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   Comments Medications reviewed with patient Daytime contact number verified with patient. Gweneth Dimitri RN  April 09, 2010 10:50 AM    Physical Exam  Additional Exam:  wt 148 > 148 March 26, 2010 > 149 April 10, 2010  HEENT mild turbinate edema.  Oropharynx no thrush or excess pnd or cobblestoning.  No JVD or cervical adenopathy. Mild accessory muscle hypertrophy. Trachea midline, nl thryroid. Chest was hyperinflated by percussion with diminished breath sounds and moderate increased exp time without wheeze. Hoover sign positive at mid inspiration. Regular rate and rhythm without murmur gallop or rub or increase P2 or edema.  Abd: no hsm, nl excursion. Ext warm without cyanosis or clubbing.     Impression & Recommendations:  Problem # 1:  COPD UNSPECIFIED (ICD-496) Predominantly manifested by cough >> sob    DDX of  difficult airways managment all start with A and  include Adherence, Ace Inhibitors, Acid Reflux, Active Sinus Disease, Alpha 1 Antitripsin deficiency, Anxiety masquerading as Airways dz,  ABPA,  allergy(esp in young), Aspiration (esp in elderly), Adverse effects of DPI,  Active smokers, plus one B  = Beta blocker use..  and one C =CHF  Active smoking greatest concern, see #2  ? Beta blocker effect, stay on Bystolic, the most beta -1  selective Beta blocker available in sample form, with bisoprolol the most selective generic choice  on the market, at least through surgery  ?acid reflux: clearly better p rx for GERD so if cough recurs off PPI need to resume otcs  Problem # 2:  PULMONARY NODULE (ICD-518.89)  Definitely could tolerate LULobectomy if resectable for cure though she and fm need to understand and accept the risk and part of that is the importance to  quit smoking for 2 weeks preop  Orders: Est. Patient Level IV (16109)  Problem # 3:  TOBACCO ABUSE (ICD-305.1)   I emphasized that although we never turn away smokers from the pulmonary clinic, we do ask that they understand that the recommendations that were made won't work nearly as well in the presence of continued cigarette exposure and we may reach a point where we can't help the patient if he/she can't quit smoking.    Orders: Est. Patient Level IV (60454)  Medications Added to Medication List This Visit: 1)  Dexilant 60 Mg Cpdr (Dexlansoprazole) .... Take  one 30-60 min before first meal of the day until gone then stop 2)  Dexilant 60 Mg Cpdr (Dexlansoprazole) .... Take  one 30-60 min before first meal of the day  until gone then stop and if cough recurs replace with prilosec  Complete Medication List: 1)  Hydrochlorothiazide 25 Mg Tabs (Hydrochlorothiazide) .Marland Kitchen.. 1 by mouth once daily for blood pressure 2)  Catapres 0.1 Mg Tabs (Clonidine hcl) .... One at bedtime 3)  Adult Aspirin Ec Low Strength 81 Mg Tbec (Aspirin) .Marland Kitchen.. 1 tab daily 4)  Nitroglycerin 0.4 Mg Subl (Nitroglycerin) .... Place one under tongue for chest pain 5)  Pravastatin Sodium 80 Mg Tabs (Pravastatin sodium) .Marland Kitchen.. 1 by mouth at bedtime for cholesterol 6)  Citalopram Hydrobromide 40 Mg Tabs (Citalopram hydrobromide) .Marland Kitchen.. 1 by mouth once daily for depression/anxiety 7)  Bystolic 10 Mg Tabs (Nebivolol hcl) .... One tablet daily 8)  Dexilant 60 Mg Cpdr (Dexlansoprazole) .... Take  one 30-60 min before first meal of the day until gone then stop and if cough recurs replace with prilosec  Patient Instructions: 1)  Stop smoking at all costs, it's the most important thing you can do. 2)  when your dexilant runs out stop it and see if cough comes back, if so start prilosec otc 20 mg Take  one 30-60 min before first meal of the day to see if can control the cough on a cheaper alternative and if not return here 3)  Continue  bystolic 10 mg one daily until done with surgery then ok to try back on metaprolol and if cough then recurs return here

## 2010-06-19 NOTE — Assessment & Plan Note (Signed)
Summary: Behavioral medicine follow-up   Primary Care Provider:  Alvia Grove DO   History of Present Illness: This is our first meeting since May, 2011 and since her surgery.  She presents largely at the prompting of her sister who is concerned about weight loss.  Dr. Gomez Cleverly determined that his is at least in part due to financial issues.  Lauren [name] is not overly worried about it except that she has lost her "signature" bottom and does not have the money to purchase new clothes.  She has had several relationships since we last met including one she cultivated over the internet with a man that lives in [place].  Discussed this at length.  Discussed the difficulty she has in relationship with her sister.  Allergies: 1)  ! Lisinopril   Impression & Recommendations:  Problem # 1:  DEPRESSIVE DISORDER, MAJOR, RCR, MILD (ICD-296.31) Report of mood is good.  Affect seems less peaceful than I remember her last.  She seems somewhat irritable especially when talking about her sister.  Her approach to relationships seems to reflect a lack of sound judgment and / or cultural differences.  She has a healthy libido and a need to be touched and loved.  She does not find her close female relationships to be a worthy substitute.  Unfortunately, finding a healthy man has been a challenge and she has had a string of people that have not brought much positive to the table.  GIven where she is looking, I am concerned that this trend will continue.  Agree with Dr. Gomez Cleverly that Jamilex is making some choices that affect her food consumption.  This goes beyond cigarettes.  Will more fully explore depressive symptoms next visit as a decreased appetite might be part of the picture.  Behavioral strategies can certainly be explored however I get the sense that this is not high on Nozomi's list of things to attend to.  Orders: Therapy 40-50- min- [facility] ([address]109)  Complete Medication List: 1)  Hydrochlorothiazide 25 Mg  Tabs (Hydrochlorothiazide) .Marland Kitchen.. 1 by mouth once daily for blood pressure 2)  Catapres 0.1 Mg Tabs (Clonidine hcl) .Marland Kitchen.. 1 tab by mouth two times a day.  do not miss doses 3)  Adult Aspirin Ec Low Strength 81 Mg Tbec (Aspirin) .Marland Kitchen.. 1 tab daily 4)  Nitroglycerin 0.4 Mg Subl (Nitroglycerin) .... Place one under tongue for chest pain 5)  Toprol Xl 50 Mg Xr24h-tab (Metoprolol succinate) .Marland Kitchen.. 1 tab by mouth daily. 6)  Miralax Powd (Polyethylene glycol 3350) .... Use as directed 7)  Pravastatin Sodium 80 Mg Tabs (Pravastatin sodium) .Marland Kitchen.. 1 by mouth at bedtime for cholesterol 8)  Citalopram Hydrobromide 40 Mg Tabs (Citalopram hydrobromide) .Marland Kitchen.. 1 by mouth once daily for depression/anxiety 9)  Mupirocin 2 % Oint (Mupirocin) .... As needed.   Orders Added: 1)  Therapy 40-50- min- [facility] [[id number]]  Appended Document: Behavioral medicine follow-up Her PHQ-9 today was 14 however she did not endorse anhedonia or depressed mood at a clinically significant level.  She denied SI completely which is consistent with her religious beliefs.  Vegetative symptoms (insomnia, poor appetite, anergia and difficulty concentrating) were the symptoms endorsed nearly every day.  Medical work-up seemed to have ruled out other causes.  Certainly the lack of sleep and poor eating can affect energy and concentration.  Will attend to sleep issues and hopefully the food issue can get resolved and we can see if there is a difference in her other symptoms.

## 2010-06-19 NOTE — Letter (Signed)
Summary: Clearance Letter  [facility], Main Office  [address]   [place], [place] [address]   Phone: [phone]  Fax: [phone]    May 14, 2010  Re:     Lauren [name] Address:   [address]     [place], [place]  [address] DOB:     1950-11-27 MRN:     [id number]   Dear Dr. Laneta Simmers:  I have seen Ms. Lauren [name] today for surgical clearance.  She is at low cardiac risk for left upper lobe lung wedge resection/ lobectomy and is therefore cleared for surgery. If you have any questions please do not hesitate to contact us at    [phone].           Sincerely,   Dr. Valera Castle Lisabeth Devoid RN

## 2010-06-19 NOTE — Progress Notes (Signed)
Summary: Req MD call/Rx  Phone Note Call from Patient Call back at [phone]   Reason for Call: Talk to Doctor Summary of Call: pt wants to speak with MD about what the surgeon said, also requesting rx for nicotene patch Initial call taken by: Knox Royalty,  April 22, 2010 3:05 PM    04-26-10 1221 Returned call > 30 min spent discussing recent surgery visit and concerns. Pt wants nicotine patches, had been using 'fake cigs' and they are no longer working.  Offered to send script now, but pt has appt Monday with me and wants to wait until then.   Also has appt with Dr. Pascal Lux on Monday at 2pm and wonders if we can meet together?  Will discuss with Dr. Pascal Lux.  Pt saw Dr. Laneta Simmers on 04-22-10 and reports that a PET scan is scheduled for Monday morning at [facility].  Plan after that is surgery vs radiation depending on results.  Also needs cardiac clearence, appt scheduled with Dr. Daleen Squibb on 05-14-10.  Pt also wanted to discuss her cerebral aneurysm and the implication of this in regards to surgery.  Reviewed note from [facility] on 09-19-09.  Read from Dr. Marlis Edelson note to pt that risk of rupture from asymptomatic aneuyrsm was 1% per year.  Pt denies any headaches or blurry vision, or recent vision changes.  Essentially aneurysm is still asymptomatic.    Will discuss need for further imagaing vs intervention on Monday with attending, given likehood of impending surgery.    Pt and I agree to discuss this further on Monday.  Reassurance given to pt, she expressed her appreciation for the care she has received from [place].

## 2010-06-19 NOTE — Consult Note (Signed)
Summary: [facility]  [facility]   Imported By: De Nurse 10/04/2009 14:19:50  _____________________________________________________________________  External Attachment:    Type:   Image     Comment:   External Document

## 2010-06-19 NOTE — Assessment & Plan Note (Signed)
Summary: f/up,tcb   Vital Signs:  Patient profile:   60 year old female Height:      64 inches Weight:      157.6 pounds BMI:     27.15 Temp:     98.8 degrees F oral Pulse rate:   67 / minute BP sitting:   143 / 76  (left arm) Cuff size:   regular  Vitals Entered By: Garen Grams LPN (November 05, 2009 2:48 PM) CC: follow up back, needs med refills, ? MRSA Is Patient Diabetic? No Pain Assessment Patient in pain? no        Primary Care Provider:  Ancil Boozer  MD  CC:  follow up back, needs med refills, and ? MRSA.  History of Present Illness: back: overall doing well. tolerated surgery well and was very pleased with Dr Charmayne Sheer.  has follow up with him July 13th again. Since surgery her pain has resolved.  she continues to have numbness however down back of leg and in bottom of R foot.  she continues to use a cane as needed with walking because of this numbness.  she has also noticed some swelling of the ankle on the right foot as well. she does not feel weak in her legs.  med refills: would like all of her meds refilled with 90 day supply.    ? MRSA: she is very concerned because when she went to get her surgery they swabbed her nose and told her she had MRSA then everyone came to see her in big gowns, etc.  she was started on mupriocin nasal and she wants to know how long she needs to use this for and what MRSA means.   Habits & Providers  Alcohol-Tobacco-Diet     Tobacco Status: current     Tobacco Counseling: to quit use of tobacco products  Current Medications (verified): 1)  Hydrochlorothiazide 25 Mg Tabs (Hydrochlorothiazide) .Marland Kitchen.. 1 By Mouth Once Daily For Blood Pressure 2)  Catapres 0.1 Mg  Tabs (Clonidine Hcl) .Marland Kitchen.. 1 Tab By Mouth Two Times A Day.  Do Not Miss Doses 3)  Adult Aspirin Ec Low Strength 81 Mg  Tbec (Aspirin) .Marland Kitchen.. 1 Tab Daily 4)  Nitroglycerin 0.4 Mg  Subl (Nitroglycerin) .... Place One Under Tongue For Chest Pain 5)  Toprol Xl 50 Mg Xr24h-Tab (Metoprolol  Succinate) .Marland Kitchen.. 1 Tab By Mouth Daily. 6)  Miralax  Powd (Polyethylene Glycol 3350) .... Use As Directed 7)  Pravastatin Sodium 80 Mg Tabs (Pravastatin Sodium) .Marland Kitchen.. 1 By Mouth At Bedtime For Cholesterol 8)  Citalopram Hydrobromide 40 Mg Tabs (Citalopram Hydrobromide) .Marland Kitchen.. 1 By Mouth Once Daily For Depression/anxiety 9)  Mupirocin 2 % Oint (Mupirocin) .... As Needed.  Allergies (verified): 1)  ! Lisinopril  Past History:  Past medical, surgical, family and social histories (including risk factors) reviewed for relevance to current acute and chronic problems.  Past Medical History: Reviewed history from 10/29/2009 and no changes required. 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,  trichomonas vaginalis treated 05/2005 left-sided colon cancer, T2 lesion, status post resection in 2008 did not need chemotherapy and radiation POSTMENOPAUSAL SYNDROME (ICD-627.9) COPD UNSPECIFIED (ICD-496) TOBACCO ABUSE (ICD-305.1) DEPRESSIVE DISORDER, MAJOR, RCR, MILD (ICD-296.31) - has done well with addition of SSRI + counseling from Dr Pascal Lux HYPERCHOLESTEROLEMIA (ICD-272.0) GERD (ICD-530.81) CEREBRAL ANEURYSM (ICD-437.3) -  see notes scanned in from [facility] HYPERTENSION, BENIGN ESSENTIAL (ICD-401.1) back pain with h/o herniated disc  2011  Past Surgical History: Reviewed history from 10/29/2009 and no changes required. left-sided colon cancer resection, 2008.xc lumbar spine surgery for herniated disc 2011   Family History: Reviewed history from 07/15/2006 and no changes required. Cousin w/breast CA, sister w/lymphatic CA., Father--living w/ OA, gout, HTN, HTN runs in family, Mother died of heart dz/enlarged heart/pericarditis at 62 yo., No FHx of DM  Social  History: Reviewed history from 08/12/2009 and no changes required. 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  Monogamous relationship 6 mos.  ; Smoking at up to 1 ppd since age 38 ; Uses EtOH in moderation. No illicit drugs.  Drinks lots of caffeine.; Has 3 grown children but one has had some trouble financially.    Review of Systems       per HPI  Physical Exam  General:  Well-developed,well-nourished,in no acute distress; alert,appropriate and cooperative throughout examination VS noted.  Msk:  scar well healed on lower back with still healing ridge present but no breakdown of skin.  nontender to touch.  strength 5/5 bilateral lower extremities.  some decreased sensation over posterior right leg and bottom of right foot.  still walking with cane.    Impression & Recommendations:  Problem # 1:  BACK PAIN WITH RADICULOPATHY (ICD-729.2) Assessment Improved  improved overall.  encouraged her to keep appt with surgeon.  told her that it may take lots of time for the numbness to resolve.  also discussed possibly considering physical therapy and to discuss this also with her surgeon.    she asked that I let Dr Constance Goltz know how thankful she was for his treatment.   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  if she gets anything that looks like infection on her skin use mupirocin on it to treat.  no need to continue nasal mupirocin.    Orders: [facility]- Est Level  3 (62952)  Complete Medication List: 1)  Hydrochlorothiazide 25 Mg Tabs (Hydrochlorothiazide) .Marland Kitchen.. 1 by mouth once daily for blood pressure 2)  Catapres 0.1 Mg Tabs (Clonidine hcl) .Marland Kitchen.. 1 tab by mouth two times a day.  do not miss doses 3)  Adult Aspirin Ec Low Strength 81 Mg Tbec (Aspirin) .Marland Kitchen.. 1 tab daily 4)  Nitroglycerin 0.4 Mg Subl  (Nitroglycerin) .... Place one under tongue for chest pain 5)  Toprol Xl 50 Mg Xr24h-tab (Metoprolol succinate) .Marland Kitchen.. 1 tab by mouth daily. 6)  Miralax Powd (Polyethylene glycol 3350) .... Use as directed 7)  Pravastatin Sodium 80 Mg Tabs (Pravastatin sodium) .Marland Kitchen.. 1 by mouth at bedtime for cholesterol 8)  Citalopram Hydrobromide 40 Mg Tabs (Citalopram hydrobromide) .Marland Kitchen.. 1 by mouth once daily for depression/anxiety 9)  Mupirocin 2 % Oint (Mupirocin) .... As needed. Prescriptions: PRAVASTATIN SODIUM 80 MG TABS (PRAVASTATIN SODIUM) 1 by mouth at bedtime for cholesterol  #90 x 3   Entered and Authorized by:   Ancil Boozer  MD   Signed by:   Ancil Boozer  MD on 11/05/2009   Method used:   Electronically to        [facility] (retail)       [address]       [place]  [place], [place]  [address]       Ph: [phone]       Fax: [phone]   RxID:   [id number] CITALOPRAM HYDROBROMIDE 40 MG TABS (CITALOPRAM HYDROBROMIDE) 1 by mouth once daily for depression/anxiety  #90 x 3   Entered and Authorized by:   Ancil Boozer  MD   Signed by:   Ancil Boozer  MD on 11/05/2009   Method used:   Electronically to        [facility] (retail)       [address]       [place], [place]  [address]       Ph: [phone]       Fax: [phone]   RxID:   [id number] TOPROL XL 50 MG XR24H-TAB (METOPROLOL SUCCINATE) 1 tab by mouth daily.  #90 x 3   Entered and Authorized by:   Ancil Boozer  MD   Signed by:   Ancil Boozer  MD on 11/05/2009   Method used:   Electronically to        [facility] (retail)       [address]       [place], [place]  [address]       Ph: [phone]       Fax: [phone]   RxID:   [id number] CATAPRES 0.1 MG  TABS (CLONIDINE HCL) 1 tab by mouth two times a day.  Do NOT miss doses  #180 x 3   Entered and Authorized by:   Ancil Boozer  MD   Signed by:   Ancil Boozer  MD on  11/05/2009   Method used:   Electronically to        [facility] (retail)       [address]       [place], [place]  [address]       Ph: [phone]       Fax: [phone]   RxID:   [id number] HYDROCHLOROTHIAZIDE 25 MG TABS (HYDROCHLOROTHIAZIDE) 1 by mouth once daily for blood pressure  #90 x 3   Entered and Authorized by:   Ancil Boozer  MD   Signed by:   Ancil Boozer  MD on 11/05/2009   Method used:   Electronically to        [facility] (retail)       [address]       [place], [place]  [address]       Ph: [phone]       Fax: [phone]   RxID:   [id number]

## 2010-06-19 NOTE — Assessment & Plan Note (Signed)
Summary: Behavioral Medicine Follow-up   Primary Care Provider:  Ancil Boozer  MD   History of Present Illness: Lauren [name] presented on time for her appt.  She shared how things went with Kevin Fenton and she is pleased with the result.  She has made a decision to not engage in a serious relationship with him but to be available to him as she is to others in need of help - if he should need it.  She does not plan to contact him.  She is doing well with the loss of the relationship - occasional loneliness that she does not want to manage with a new relationship.  Discussed smoking cessation.  She believes most people are addicted to something - hers is tobacco.  See assessment / plan.  Allergies: 1)  ! Lisinopril   Impression & Recommendations:  Problem # 1:  DEPRESSIVE DISORDER, MAJOR, RCR, MILD (ICD-296.31) Assessment Improved  Report of mood is euthymic.  Affect is consistent.    Continued improvement in depressive symtpoms despite the development with Kevin Fenton.  Some minor sleep difficulties.  Overall feeling well.  Some concerns about weight loss but her religious beliefs attenuate feelings of anxiety about this.  She journals.  She takes her medication.  She has good social support.  Follow-up as scheduled - near the end of May.  Orders: Therapy 40-50- min- [facility] (44010)  Problem # 2:  TOBACCO ABUSE (ICD-305.1)  Precontemplation.  She thought a lot about it today but she is highly resistant to the idea of quitting.  She does not see herself ever quitting.  She will decrease the amount she smoked dependent on her financial situation.  This is happening right now.    Will continue to address.  I do not hear much ambivalence with regards to her smoking.  Orders: Therapy 40-50- min- [facility] ([address])  Complete Medication List: 1)  Hydrochlorothiazide 25 Mg Tabs (Hydrochlorothiazide) .Marland Kitchen.. 1 by mouth once daily for blood pressure 2)  Catapres 0.1 Mg Tabs (Clonidine hcl) .Marland Kitchen.. 1 tab by mouth two  times a day.  do not miss doses 3)  Adult Aspirin Ec Low Strength 81 Mg Tbec (Aspirin) .Marland Kitchen.. 1 tab daily 4)  Nitroglycerin 0.4 Mg Subl (Nitroglycerin) .... Place one under tongue for chest pain 5)  Toprol Xl 50 Mg Xr24h-tab (Metoprolol succinate) .Marland Kitchen.. 1 tab by mouth daily. 6)  Miralax Powd (Polyethylene glycol 3350) .... Use as directed 7)  Simvastatin 80 Mg Tabs (Simvastatin) .Marland Kitchen.. 1 by mouth at bedtime for cholesterol. 8)  Citalopram Hydrobromide 40 Mg Tabs (Citalopram hydrobromide) .Marland Kitchen.. 1 by mouth once daily for depression/anxiety

## 2010-06-19 NOTE — Assessment & Plan Note (Signed)
Summary: headaches/eo   Vital Signs:  Patient profile:   60 year old female Height:      64 inches Weight:      148.13 pounds BMI:     25.52 BSA:     1.72 Temp:     98.3 degrees F Pulse rate:   61 / minute BP sitting:   168 / 80  Vitals Entered By: Jone Baseman CMA (April 15, 2010 2:30 PM) CC: review of pulm dx Is Patient Diabetic? No Pain Assessment Patient in pain? no        Primary Care Provider:  Alvia Grove DO  CC:  review of pulm dx.  History of Present Illness:  60 yo female here for f/u of recent testing.  wants to review results. Had lung bx done Apr 02 2010.  Reviewed note from Dr. Sherene Sires, seems pt could tolerate LULobectomy if resectable for cure.  Key is pt needs  to quit smoking for 2 weeks preop.  Pt has not smoked since Sunday morning.  Is using several 'fake' cigs for habit control.  Some cough still present, not limited by sob but sedentary.  some am sputum but not pululent and minimal hemoptysis. Pt denies any significant sore throat, dysphagia, itching, sneezing,  no wt loss since prev ov, no cp.   Eating alot to try and stay busy, not sleeping well.  Unable to fall asleep, can't stop thinkg about smoking.   Will see Dr. 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) 3)  Back Pain With Radiculopathy  (ICD-729.2) 4)  Weight Loss, Recent  (ICD-783.21) 5)  Postmenopausal Syndrome  (ICD-627.9) 6)  COPD Unspecified  (ICD-496) 7)  Sexual Activity, High Risk  (ICD-V69.2) 8)  Constipation  (ICD-564.00) 9)  Personal Hx Colon Cancer  (ICD-V10.05) 10)  Tobacco Abuse  (ICD-305.1) 11)  Mitral Valve Prolapse, Hx of   (ICD-V12.50) 12)  Cad  (ICD-414.00) 13)  Depressive Disorder, Major, Rcr, Mild  (ICD-296.31) 14)  Hypercholesterolemia  (ICD-272.0) 15)  Gerd  (ICD-530.81) 16)  Cerebral Aneurysm  (ICD-437.3) 17)  Hypertension, Benign Essential  (ICD-401.1)  Current Medications (verified): 1)  Hydrochlorothiazide 25 Mg Tabs (Hydrochlorothiazide) .Marland Kitchen.. 1 By Mouth Once Daily For Blood Pressure 2)  Catapres 0.1 Mg  Tabs (Clonidine Hcl) .... One At Bedtime 3)  Adult Aspirin Ec Low Strength 81 Mg  Tbec (Aspirin) .Marland Kitchen.. 1 Tab Daily 4)  Nitroglycerin 0.4 Mg  Subl (Nitroglycerin) .... Place One Under Tongue For Chest Pain 5)  Pravastatin Sodium 80 Mg Tabs (Pravastatin Sodium) .Marland Kitchen.. 1 By Mouth At Bedtime For Cholesterol 6)  Citalopram Hydrobromide 40 Mg Tabs (Citalopram Hydrobromide) .Marland Kitchen.. 1 By Mouth Once Daily For Depression/anxiety 7)  Bystolic 10 Mg  Tabs (Nebivolol Hcl) .... One Tablet Daily 8)  Ambien 10 Mg Tabs (Zolpidem Tartrate) .... Take 1 Pill By Mouth At Bedtime  Allergies (verified): 1)  ! Lisinopril  Past History:  Past Medical History: Last updated: 04/09/2010 Lung nodule noted on CT, most recent shows increase in size (02/2010)      - FNA ordered for 04/02/10  by [facility]> pos Ca  chronic folliculitis of groin,  fibrocystic breast changes,  h/o hypokalemia,  h/o syncope--most likely vasovagal etiology,  Left chest/ axillary line nevus (1x0.5x0.5cm 1/05),  mitral valve prolapse,  Non-obstructive CAD,  trichomonas vaginalis treated 05/2005 left-sided colon cancer, T2 lesion, status post resection in 2008 did not need chemotherapy and radiation POSTMENOPAUSAL SYNDROME (ICD-627.9) COPD UNSPECIFIED (ICD-496) 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,       - PFT's April 09, 2010  FEV1 1.3 (63%) with ratio 82% TOBACCO ABUSE (ICD-305.1) DEPRESSIVE DISORDER, MAJOR, RCR, MILD (ICD-296.31) - has done well with addition of SSRI + counseling from Dr Pascal Lux HYPERCHOLESTEROLEMIA (ICD-272.0) GERD (ICD-530.81) CEREBRAL  ANEURYSM (ICD-437.3) - see notes scanned in from [facility] HYPERTENSION, BENIGN ESSENTIAL (ICD-401.1) back pain with h/o herniated disc  2011  Past Surgical History: Last updated: 10/29/2009 left-sided colon cancer resection, 2008.xc lumbar spine surgery for herniated disc 2011   Family History: Last updated: 07/15/2006 Cousin w/breast CA, sister w/lymphatic CA., Father--living w/ OA, gout, HTN, HTN runs in family, Mother died of heart dz/enlarged heart/pericarditis at 62 yo., No FHx of DM  Social History: Last updated: 03/13/2010 Widowed; 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  ; Smoking at up to 2 ppd since age 47 ; Uses EtOH in moderation. No illicit drugs.  Drinks lots of caffeine.; Has 3 grown children but one has had some trouble financially.  One son lives locally, other 2 children are in other states  Risk Factors: Smoking Status: quit (04/15/2010) Packs/Day: 2.0 (03/13/2010) Passive Smoke Exposure: yes (04/15/2010)  Social History: Smoking Status:  quit  Review of Systems       see hpi  Physical Exam  General:  Vital signs reviewed Well-developed, well-nourished patient in NAD.  Awake, cooperative.  Lungs:  Normal respiratory effort, chest expands symmetrically. Lungs are clear to auscultation, no crackles or wheezes. Heart:  Normal rate and regular rhythm. S1 and S2 normal without gallop, murmur, click, rub or other extra sounds. Abdomen:  soft, non-tender, and normal bowel sounds.  no distention, no masses, and no guarding.   Psych:  Oriented X3, memory intact for recent and remote, good eye contact, not anxious appearing, not depressed appearing, not suicidal, and not homicidal.     Impression & Recommendations:  Problem # 1:  PULMONARY NODULE (ICD-518.89)  Orders:>30 minutes spent counseling pt on dx and discussing implications of dx. answered questions. Pt to f/u with surgery on 04-22-10.  Try ambien  at night for sleep, short term use only.  see pt instructions. [facility]- Est  Level 4 (99214)  Complete Medication List: 1)  Hydrochlorothiazide 25 Mg Tabs (Hydrochlorothiazide) .Marland Kitchen.. 1 by mouth once daily for blood pressure 2)  Catapres 0.1 Mg Tabs (Clonidine hcl) .... One at bedtime 3)  Adult Aspirin Ec Low Strength 81 Mg Tbec (Aspirin) .Marland Kitchen.. 1 tab daily 4)  Nitroglycerin 0.4 Mg Subl (Nitroglycerin) .... Place one under tongue for chest pain 5)  Pravastatin Sodium 80 Mg Tabs (Pravastatin sodium) .Marland Kitchen.. 1 by mouth at bedtime for cholesterol 6)  Citalopram Hydrobromide 40 Mg Tabs (Citalopram hydrobromide) .Marland Kitchen.. 1 by mouth once daily for depression/anxiety 7)  Bystolic 10 Mg Tabs (Nebivolol hcl) .... One tablet daily 8)  Ambien 10 Mg Tabs (Zolpidem tartrate) .... Take 1 pill by mouth at bedtime  Patient Instructions: 1)  Try the Ambien for sleep.  2)  Per Dr. Sherene Sires: 3)   Stop smoking at all costs, it's the most important  thing you can do. 4)  2)  when your dexilant runs out stop it and see if cough comes back, if so start prilosec otc 20 mg Take  one 30-60 min before first meal of the day to see if can control the cough on a cheaper alternative and if not return here 5)  3)  Continue bystolic 10 mg one daily until done with surgery then ok to try back on metaprolol and if cough then recurs return here Prescriptions: AMBIEN 10 MG TABS (ZOLPIDEM TARTRATE) take 1 pill by mouth at bedtime  #30 x 3   Entered and Authorized by:   Alvia Grove DO   Signed by:   Alvia Grove DO on 04/21/2010   Method used:   Handwritten   RxID:   [id number]    Orders Added: 1)  [facility]- Est  Level 4 [[address]606]

## 2010-06-19 NOTE — Progress Notes (Signed)
Summary: Schedule [facility]  Phone Note Call from Patient   Caller: Patient Call For: Spero Geralds, Psy.D. Summary of Call: Patient left VM requesting an appt.  Called her back.  She was at [facility] getting her Celexa prescription filled.  Scheduled for April 11th at 11:00. Initial call taken by: Spero Geralds PsyD,  August 13, 2009 4:37 PM

## 2010-06-19 NOTE — Assessment & Plan Note (Signed)
Summary: smoking cessation/eo   Primary Care Provider:  Ancil Boozer  MD   History of Present Illness: Patient arrives for group tobacco cessation class.   Patient reports quitting once previously.  Longest reported duration of absence from tobacco was: 1 week  Patient started smoking at age of: 40  . Currently smoking: 1/2  packs per day of: First One Menthol  Patient reports smoking: immediately after awakening.   Reports other smoking friends and family.    Habits & Providers  Alcohol-Tobacco-Diet     Tobacco Status: current     Pack years: 16  Allergies: 1)  ! Lisinopril   Impression & Recommendations:  Problem # 1:  TOBACCO ABUSE (ICD-305.1) Patient attended group tobacco cessation class.  No quit date selected.  Patient has agreed to attend group class next month.  Until then patient will attempt to taper down cigarettes to 6-8 per day (from 10-11 cig/day).  At next group class, patient will consider setting a new quit date.  Patient will continue taking bupropion.  Reevaluate at next group class. Total Time in Group Class 60 mintues.   Her updated medication list for this problem includes:    Zyban 150 Mg Xr12h-tab (Bupropion hcl (smoking deter)) .Marland Kitchen... Take one each morning for three days. then increas dose to one each morning and second dose 8 hours later.    Nicotrol 10 Mg Inha (Nicotine) ..... Use up to 5 cartridges per day. dispense one box of 168.  Complete Medication List: 1)  Hydrochlorothiazide 25 Mg Tabs (Hydrochlorothiazide) .Marland Kitchen.. 1 by mouth once daily for blood pressure 2)  Catapres 0.1 Mg Tabs (Clonidine hcl) .Marland Kitchen.. 1 tab by mouth two times a day.  do not miss doses 3)  Adult Aspirin Ec Low Strength 81 Mg Tbec (Aspirin) .Marland Kitchen.. 1 tab daily 4)  Nitroglycerin 0.4 Mg Subl (Nitroglycerin) .... Place one under tongue for chest pain 5)  Toprol Xl 50 Mg Xr24h-tab (Metoprolol succinate) .Marland Kitchen.. 1 tab by mouth daily. 6)  Miralax Powd (Polyethylene glycol 3350) .... Use as  directed 7)  Pravastatin Sodium 40 Mg Tabs (Pravastatin sodium) .... 2 by mouth at bedtime for cholesterol 8)  Metamucil 30.9 % Powd (Psyllium) .... As needed 9)  Zyban 150 Mg Xr12h-tab (Bupropion hcl (smoking deter)) .... Take one each morning for three days. then increas dose to one each morning and second dose 8 hours later. 10)  Nicotrol 10 Mg Inha (Nicotine) .... Use up to 5 cartridges per day. dispense one box of 168.  Tobacco Counseling   Currently uses tobacco.    Cigarettes      Year started smoking cigarettes:      1967     Number of packs per day smoked:      1.0     Years smoked:            44     Packs per year:          365     Pack Years:            44  Cessation Stage:     Contemplative Target Quit Date:     06/11/2009  Quitting Barriers:      -  stress     -  fear of weight gain     -  enjoys smoking     -  feels addicted  Quitting Motivators:      -  poor health     -  coronary disease     -  COPD     -  smoking related malignancy     -  cost     -  family pressure     -  social pressure     -  healthier lifestyle  Comments: Brand smoked:  Any Brand that is cheap.    Nicotine Content per Cigarette (mg):   ~1mg    Estimated Nicotine intake per day:  10mg .  Smokes first cigarette: <5 minutes after waking. Denies waking to smoke.  Estimated Fagerstrom Score: <6 Patient reports readiness to quit on 1-10 scale of:  5    Previous Quit Attempts   Previously Tried to quit:     Yes # of Previous quit attempts:     3 Longest Successful Quit Period:   1 weeks   Quit Methods Tried:      -  nicotine patch     -  nicotine gum     -  nicotine inhaler     -  Chantix  Reason for restarting:      -  stress     -  feels addicted  Comments: to quit use of tobacco products      Appended Document: Orders Update    Clinical Lists Changes  Orders: Added new Service order of Smoking Cessation Class (Z6109) - Signed

## 2010-06-19 NOTE — Progress Notes (Signed)
Summary: triage  Phone Note Call from Patient Call back at Home Phone [phone]   Caller: Patient Summary of Call: having back pain that is going down her legs - wants to come in this AM Initial call taken by: De Nurse,  Sep 25, 2009 8:32 AM  Follow-up for Phone Call        states she has had this before but not to this extent. took ibuprofen 800mg  which helped. will see pcp at 1:30 today Follow-up by: Golden Circle RN,  Sep 25, 2009 8:39 AM

## 2010-06-19 NOTE — Assessment & Plan Note (Signed)
Summary: headache,df   Vital Signs:  Patient profile:   60 year old female Weight:      164.2 pounds Temp:     98.1 degrees F oral Pulse rate:   66 / minute BP sitting:   128 / 76  (right arm) Cuff size:   regular  Vitals Entered By: Loralee Pacas CMA (June 21, 2009 1:34 PM) CC: headache Comments headache started 3 days ago.. starts on the left side of her neck radiates forward and only on the left. pt states that she has a hx of migraines but haven't had any for about 20 years ago also has hx of anuresum and angina.    Primary Care Provider:  Ancil Boozer  MD  CC:  headache.  History of Present Illness: 60 year old female with PMH sign for cerebral aneurysm and HTN who presents with 3 day history of headache.    HA:  started 3 days ago in Left back of neck.  Spread through head to parietal region.  Complains of some blurry vision on that side.  No trauma to area.  States she a friend gave her 2 Advil yesterday which relieved the pain.  This morning pain returned, took 2 more Advil with relief.  Now states that headaches are starting to return this PM.  Pain bad enough to cause her to become nauseous last night, no vomiting.  Brain anuerysm diagnosed several years ago via MRI.  Pain not reproducible when moving head or neck.   ROS:  no fever, no chills, no neck stiffness.  No vomiting.  No decrease in upper extremity range of motion, no motor weakness or paresthesias.      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.9) 2)  Fatigue  (ICD-780.79) 3)  Cough  (ICD-786.2) 4)  Pain in Joint Other Specified Sites  (ICD-719.48) 5)  COPD Unspecified  (ICD-496) 6)  Sexual Activity, High Risk  (ICD-V69.2) 7)  Constipation  (ICD-564.00) 8)  Personal Hx Colon Cancer  (ICD-V10.05) 9)  Cancer, Colorectal  (ICD-154.0) 10)  Tobacco Abuse  (ICD-305.1) 11)  Mitral Valve Prolapse, Hx of  (ICD-V12.50) 12)  Cad  (ICD-414.00) 13)  Depressive Disorder, Major, Rcr, Mild   (ICD-296.31) 14)  Hypercholesterolemia  (ICD-272.0) 15)  Gerd  (ICD-530.81) 16)  Cerebral Aneurysm  (ICD-437.3) 17)  Hypertension, Benign Essential  (ICD-401.1)  Current Medications (verified): 1)  Hydrochlorothiazide 25 Mg Tabs (Hydrochlorothiazide) .Marland Kitchen.. 1 By Mouth Once Daily For Blood Pressure 2)  Catapres 0.1 Mg  Tabs (Clonidine Hcl) .Marland Kitchen.. 1 Tab By Mouth Two Times A Day.  Do Not Miss Doses 3)  Adult Aspirin Ec Low Strength 81 Mg  Tbec (Aspirin) .Marland Kitchen.. 1 Tab Daily 4)  Nitroglycerin 0.4 Mg  Subl (Nitroglycerin) .... Place One Under Tongue For Chest Pain 5)  Toprol Xl 50 Mg Xr24h-Tab (Metoprolol Succinate) .Marland Kitchen.. 1 Tab By Mouth Daily. 6)  Miralax  Powd (Polyethylene Glycol 3350) .... Use As Directed 7)  Pravastatin Sodium 40 Mg Tabs (Pravastatin Sodium) .... 2 By Mouth At Bedtime For Cholesterol 8)  Metamucil 30.9 % Powd (Psyllium) .... As Needed 9)  Zyban 150 Mg Xr12h-Tab (Bupropion Hcl (Smoking Deter)) .... Take One Each Morning For Three Days. Then Increas Dose To One Each Morning and Second Dose 8 Hours Later. 10)  Nicotrol 10 Mg Inha (Nicotine) .... Use Up To 5 Cartridges Per Day. Dispense One Box of 168. 11)  Ibuprofen 800 Mg Tabs (Ibuprofen) .... Take 1 Tab in Am and  1 Tab in Pm With Food  Allergies (verified): 1)  ! Lisinopril  Physical Exam  General:  Vital signs reviewed Well-developed, well-nourished patient in NAD.  Awake, cooperative.  Eyes:  No corneal or conjunctival inflammation noted. EOMI. Perrla. Funduscopic exam benign, without hemorrhages, exudates or papilledema. Vision grossly normal. Neck:  Very tender to palpation throughout neck to left of spine.  Very tender to palpation along trapezius muscle to the shoulder.  Multiple trigger points for pain.  No nuchal rigidty, no decreased range of motion, no masses palpated.  Lungs:  Normal respiratory effort, chest expands symmetrically. Lungs are clear to auscultation, no crackles or wheezes. Heart:  Normal rate and regular  rhythm. S1 and S2 normal without gallop, murmur, click, rub or other extra sounds. Msk:  full active and passive range of motion bilateral upper extremities   Impression & Recommendations:  Problem # 1:  HEADACHE (ICD-784.0) Assessment New  Most likely secondary to acute muscle spasm.  Patient can, with 1 finger, point to exact spot on her neck where pain originates.  Has been progessing in severity for past several days.  Multiple trigger points along trapezius muscle.  Possible that pain triggered migraine in this patient, however patient does not have history of migraines.  Reassured that pain relieved by OTC Advil.  Less likely for aneurysm rupture due to prolonged course, lack of severity of pain, and fact that blood pressure is good today 128/76.  Recommend patient to use 800 mg Ibuprofen.  Also recommended neck exercises, heat to the area, massage. Gave red flags.   Follow up on as needed basis.   Her updated medication list for this problem includes:    Adult Aspirin Ec Low Strength 81 Mg Tbec (Aspirin) .Marland Kitchen... 1 tab daily    Toprol Xl 50 Mg Xr24h-tab (Metoprolol succinate) .Marland Kitchen... 1 tab by mouth daily.    Ibuprofen 800 Mg Tabs (Ibuprofen) .Marland Kitchen... Take 1 tab in am and 1 tab in pm with food  Orders: [facility]- Est Level  3 ([address])  Complete Medication List: 1)  Hydrochlorothiazide 25 Mg Tabs (Hydrochlorothiazide) .Marland Kitchen.. 1 by mouth once daily for blood pressure 2)  Catapres 0.1 Mg Tabs (Clonidine hcl) .Marland Kitchen.. 1 tab by mouth two times a day.  do not miss doses 3)  Adult Aspirin Ec Low Strength 81 Mg Tbec (Aspirin) .Marland Kitchen.. 1 tab daily 4)  Nitroglycerin 0.4 Mg Subl (Nitroglycerin) .... Place one under tongue for chest pain 5)  Toprol Xl 50 Mg Xr24h-tab (Metoprolol succinate) .Marland Kitchen.. 1 tab by mouth daily. 6)  Miralax Powd (Polyethylene glycol 3350) .... Use as directed 7)  Pravastatin Sodium 40 Mg Tabs (Pravastatin sodium) .... 2 by mouth at bedtime for cholesterol 8)  Metamucil 30.9 % Powd (Psyllium) .... As  needed 9)  Zyban 150 Mg Xr12h-tab (Bupropion hcl (smoking deter)) .... Take one each morning for three days. then increas dose to one each morning and second dose 8 hours later. 10)  Nicotrol 10 Mg Inha (Nicotine) .... Use up to 5 cartridges per day. dispense one box of 168. 11)  Ibuprofen 800 Mg Tabs (Ibuprofen) .... Take 1 tab in am and 1 tab in pm with food  Patient Instructions: 1)  You have a muscle spasm in your neck causing your headaches.  2)  Take the Ibuprofen 800 mg 1 tab in AM and 1 tab in PM for next several days with food. 3)  Use massage, heat, shower, Icy-hot to help relieve the muscle spasm.   4)  It was good to meet you today! Prescriptions: IBUPROFEN 800 MG TABS (IBUPROFEN) Take 1 tab in AM and 1 tab in PM with food  #30 x 1   Entered and Authorized by:   Renold Don MD   Signed by:   Renold Don MD on 06/21/2009   Method used:   Electronically to        [facility] (retail)       [address]       [place], [place]  [address]       Ph: [phone]       Fax: [phone]   RxID:   [id number]

## 2010-06-19 NOTE — Assessment & Plan Note (Signed)
Summary: wt:165/lexiscan/dx:chest pain /lg  Nuclear Med Background Indications for Stress Test: Evaluation for Ischemia, Post Hospital  Indications Comments: 06/03/09 CP (-) enzymes  History: COPD, Heart Catheterization, Myocardial Perfusion Study  History Comments: 02/06 Heart Cath EF>60% N/O CAD  08/09 MPS EF 59% (-) scar (-) ischemia   Symptoms: Chest Pain, DOE, Palpitations, SOB  Symptoms Comments: CP goes thru to her back   Nuclear Pre-Procedure Cardiac Risk Factors: Hypertension, Lipids, Smoker Caffeine/Decaff Intake: none NPO After: 10:00 PM Lungs: clear IV 0.9% NS with Angio Cath: 20g     IV Site: (R) AC IV Started by: Stanton Kidney EMT-P Chest Size (in) 38     Cup Size C     Height (in): 64 Weight (lb): 164 BMI: 28.25  Nuclear Med Study 1 or 2 day study:  1 day     Stress Test Type:  Eugenie Birks Reading MD:  Charlton Haws, MD     Referring MD:  T.Wall Resting Radionuclide:  Technetium 44m Tetrofosmin     Resting Radionuclide Dose:  10.0 mCi  Stress Radionuclide:  Technetium 77m Tetrofosmin     Stress Radionuclide Dose:  33.0 mCi   Stress Protocol   Lexiscan: 0.4 mg   Stress Test Technologist:  Milana Na EMT-P     Nuclear Technologist:  Burna Mortimer Deal RT-N  Rest Procedure  Myocardial perfusion imaging was performed at rest 45 minutes following the intravenous administration of Myoview Technetium 33m Tetrofosmin.  Stress Procedure  The patient received IV Lexiscan 0.4 mg over 15-seconds.  Myoview injected at 30-seconds.  There were non specific changes, rare pvcs, + chest discomfort, sob, and a headache with infusion.  Quantitative spect images were obtained after a 45 minute delay.  QPS Raw Data Images:  Normal; no motion artifact; normal heart/lung ratio. Stress Images:  NI: Uniform and normal uptake of tracer in all myocardial segments. Rest Images:  Normal homogeneous uptake in all areas of the myocardium. Subtraction (SDS):  Normal Transient Ischemic  Dilatation:  1.02  (Normal <1.22)  Lung/Heart Ratio:  .26  (Normal <0.45)  Quantitative Gated Spect Images QGS EDV:  70 ml QGS ESV:  25 ml QGS EF:  64 % QGS cine images:  Normal  Findings Normal nuclear study      Overall Impression  Exercise Capacity: Lexiscan BP Response: Normal blood pressure response. Clinical Symptoms: Dysnpne ECG Impression: No significant ST segment change suggestive of ischemia. Overall Impression: Normal stress nuclear study. Overall Impression Comments: normal  Appended Document: wt:165/lexiscan/dx:chest pain /lg reassurance.  Reviewed Juanito Doom, MD  Appended Document: wt:165/lexiscan/dx:chest pain Lonell Grandchild PT AWARE./CY

## 2010-06-19 NOTE — Assessment & Plan Note (Signed)
Summary: back pain/[place]   Vital Signs:  Patient profile:   60 year old female Height:      64 inches Weight:      152 pounds BMI:     26.19 Temp:     98.1 degrees F Pulse rate:   59 / minute BP sitting:   127 / 77  (left arm)  Vitals Entered By: Jone Baseman CMA (Sep 25, 2009 1:51 PM) Is Patient Diabetic? No Pain Assessment Patient in pain? yes     Location: low back Intensity: 9   Primary Care Provider:  Ancil Boozer  MD   History of Present Illness: back and leg pain: significant pain since she went dancing Sunday night.  doesn't recall an acute injury but when got home from dancing noticed some pain.  Then on Monday morning woke up in severe pain and with back "locked up."  since then it has been getting worse.  she describes pain down the backs of both legs.  she has tried 800mg  ibuprofen which does help some.  she has noticed it is hard to get around the house and do her ADLs as a result of pain.  she denies problems with urination or stooling and denies saddle anesthesia.  denies weakness in her legs, just pain which she describes as a constant dull toothache that grabs at times particularly when moving.    Habits & Providers  Alcohol-Tobacco-Diet     Tobacco Status: current     Tobacco Counseling: to quit use of tobacco products     Cigarette Packs/Day: 0.5  Current Medications (verified): 1)  Hydrochlorothiazide 25 Mg Tabs (Hydrochlorothiazide) .Marland Kitchen.. 1 By Mouth Once Daily For Blood Pressure 2)  Catapres 0.1 Mg  Tabs (Clonidine Hcl) .Marland Kitchen.. 1 Tab By Mouth Two Times A Day.  Do Not Miss Doses 3)  Adult Aspirin Ec Low Strength 81 Mg  Tbec (Aspirin) .Marland Kitchen.. 1 Tab Daily 4)  Nitroglycerin 0.4 Mg  Subl (Nitroglycerin) .... Place One Under Tongue For Chest Pain 5)  Toprol Xl 50 Mg Xr24h-Tab (Metoprolol Succinate) .Marland Kitchen.. 1 Tab By Mouth Daily. 6)  Miralax  Powd (Polyethylene Glycol 3350) .... Use As Directed 7)  Simvastatin 80 Mg Tabs (Simvastatin) .Marland Kitchen.. 1 By Mouth At Bedtime For  Cholesterol. 8)  Citalopram Hydrobromide 40 Mg Tabs (Citalopram Hydrobromide) .Marland Kitchen.. 1 By Mouth Once Daily For Depression/anxiety 9)  Prednisone 20 Mg Tabs (Prednisone) .Marland Kitchen.. 1 Tab By Mouth Once Daily For 7 Days For Back Pain 10)  Cyclobenzaprine Hcl 10 Mg Tabs (Cyclobenzaprine Hcl) .... 1/2 - 1 By Mouth Three Times A Day As Needed Muscle Spasms 11)  Tramadol Hcl 50 Mg Tabs (Tramadol Hcl) .Marland Kitchen.. 1 By Mouth Three Times A Day As Needed Severe Pain  Allergies (verified): 1)  ! Lisinopril  Social History: Packs/Day:  0.5  Review of Systems       per HPI.  denies fevers  Physical Exam  General:  VS reviewed - appears uncomfortable in chair. otherwise Well-developed, well-nourished patient in NAD.  Awake, cooperative.  Msk:  nontender over spinal column.  tender bilaterally in paraspinous muscles L>R.  strength 5/5 bilateral lower extremities.  DTRs symmetrical and equal in bilateral lower extremities.  sensation intact to light touch.  antalgic gait with cane.   Impression & Recommendations:  Problem # 1:  BACK PAIN WITH RADICULOPATHY (ICD-729.2) Assessment New  suspect likely a herniated disc.  no neurologic red flags today and these were reviewed with patient for return reasons. acutely encouraged movement,  rx prednisone. and as needed flexeril and tramadol (doesn't tolerate opioids, tylenol for GI reasons)  Orders: [facility]- Est Level  3 ([address])  Complete Medication List: 1)  Hydrochlorothiazide 25 Mg Tabs (Hydrochlorothiazide) .Marland Kitchen.. 1 by mouth once daily for blood pressure 2)  Catapres 0.1 Mg Tabs (Clonidine hcl) .Marland Kitchen.. 1 tab by mouth two times a day.  do not miss doses 3)  Adult Aspirin Ec Low Strength 81 Mg Tbec (Aspirin) .Marland Kitchen.. 1 tab daily 4)  Nitroglycerin 0.4 Mg Subl (Nitroglycerin) .... Place one under tongue for chest pain 5)  Toprol Xl 50 Mg Xr24h-tab (Metoprolol succinate) .Marland Kitchen.. 1 tab by mouth daily. 6)  Miralax Powd (Polyethylene glycol 3350) .... Use as directed 7)  Simvastatin 80  Mg Tabs (Simvastatin) .Marland Kitchen.. 1 by mouth at bedtime for cholesterol. 8)  Citalopram Hydrobromide 40 Mg Tabs (Citalopram hydrobromide) .Marland Kitchen.. 1 by mouth once daily for depression/anxiety 9)  Prednisone 20 Mg Tabs (Prednisone) .Marland Kitchen.. 1 tab by mouth once daily for 7 days for back pain 10)  Cyclobenzaprine Hcl 10 Mg Tabs (Cyclobenzaprine hcl) .... 1/2 - 1 by mouth three times a day as needed muscle spasms 11)  Tramadol Hcl 50 Mg Tabs (Tramadol hcl) .Marland Kitchen.. 1 by mouth three times a day as needed severe pain  Other Orders: Ketorolac-Toradol 15mg  (Y7062)  Patient Instructions: 1)  IF you develop weakness in the legs, trouble passing your urine or stool OR problems holding in your urine or stool or anything else worrisome let us know right away. 2)  Take the prednisone daily for the next week. 3)  Use the tramadol and cyclobenzaprine as needed. 4)  Remember, to keep moving - this will help the pain the most of all of these things.  5)  Also remember - a great majority of the time this goes away with time and the above things we are doing. Prescriptions: TRAMADOL HCL 50 MG TABS (TRAMADOL HCL) 1 by mouth three times a day as needed severe pain  #30 x 1   Entered and Authorized by:   Ancil Boozer  MD   Signed by:   Ancil Boozer  MD on 09/25/2009   Method used:   Electronically to        [facility] (retail)       [address]       [place], [place]  [address]       Ph: [phone]       Fax: [phone]   RxID:   [id number] CYCLOBENZAPRINE HCL 10 MG TABS (CYCLOBENZAPRINE HCL) 1/2 - 1 by mouth three times a day as needed muscle spasms  #30 x 1   Entered and Authorized by:   Ancil Boozer  MD   Signed by:   Ancil Boozer  MD on 09/25/2009   Method used:   Electronically to        [facility] (retail)       [address]       [place], [place]  [address]       Ph: [phone]       Fax: [phone]   RxID:    [id number] PREDNISONE 20 MG TABS (PREDNISONE) 1 tab by mouth once daily for 7 days for back pain  #7 x 0   Entered and Authorized by:   Ancil Boozer  MD   Signed by:   Ancil Boozer  MD  on 09/25/2009   Method used:   Electronically to        [facility] (retail)       [address]       [place], [place]  [address]       Ph: [phone]       Fax: [phone]   RxID:   [id number]    Vital Signs:  Patient Profile:   60 year old female Height:     64 inches Weight:      152 pounds BMI:     26.19 Temp:     98.1 degrees F Pulse rate:   59 / minute BP sitting:   127 / 77    Location:   low back    Intensity:   9  Vitals Entered By: Theresia Lo RN                     Medication Administration  Injection # 1:    Medication: Ketorolac-Toradol 15mg     Diagnosis: BACK PAIN (ICD-724.5)    Route: IM    Site: RUOQ gluteus    Exp Date: 05/19/2011    Lot #: [id number]    Mfr: hospira    Comments: 30mg  given    Patient tolerated injection without complications    Given by: Jone Baseman CMA (Sep 25, 2009 2:21 PM)  Orders Added: 1)  Ketorolac-Toradol 15mg  [J1885] 2)  [facility]- Est Level  3 [01027]

## 2010-06-19 NOTE — Progress Notes (Signed)
  Phone Note Call from Patient   Reason for Call: Talk to Doctor Summary of Call: Pt complaining of back pain.  She went out dancing on mother's day and since then she has had bad low back pain.  It seems to be getting worse and it now hurts to walk or really do anything.  She denies any focal numbness / weakness, or loss of bowel / bladder function.  She took 800mg  Ibuprofen which did help.  Advised her to call clinic in the morning and take Tylenol / Ibuprofen in the meantime.  Also advised that if she cannot tolerate it, then she can go to Urgent Care.  Pt was in complete understanding and agreement. Initial call taken by: Angelena Sole MD,  Sep 24, 2009 7:06 PM

## 2010-06-19 NOTE — Progress Notes (Signed)
Summary: Report on surgery  Phone Note Call from Patient   Caller: Patient Call For: Spero Geralds, Psy.D. Summary of Call: Roxy called to let me know that she is home from her back surgery.  She had it on Monday and was home on Tuesday.  She says she is doing very well.  Neighbors and family members (sister and daughter) are helping her.  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  I talked to her about it a little bit but she hopes to schedule with Dr. Sandi Mealy before she leaves and I suggested she talk to Dr. Sandi Mealy as well to ensure nothing needs to be done.  She was given a medicine to take for five days prior to her surgery.  I encouraged her to schedule an appt or call to check-in as needed.  She agreed. Initial call taken by: Spero Geralds PsyD,  October 25, 2009 12:10 PM

## 2010-06-19 NOTE — Consult Note (Signed)
Summary: [facility]  [facility]   Imported By: De Nurse 03/26/2010 14:48:36  _____________________________________________________________________  External Attachment:    Type:   Image     Comment:   External Document

## 2010-06-19 NOTE — Assessment & Plan Note (Signed)
Summary: eph/ gd   Visit Type:  EPH Referring Provider:  n/a Primary Provider:  Ancil Boozer  MD  CC:  sob...fatigue...denies any cp or edema.  History of Present Illness: Lauren [name] comes in today for followup of chest pain. She had a stress Myoview which shows a normal ejection fraction of 64% with no ischemia and no scar.  She is no longer having chest pain.  Recent LDL was 151. She is noncompliant with statin. She continues to smoke heavily.  Current Medications (verified): 1)  Hydrochlorothiazide 25 Mg Tabs (Hydrochlorothiazide) .Marland Kitchen.. 1 By Mouth Once Daily For Blood Pressure 2)  Catapres 0.1 Mg  Tabs (Clonidine Hcl) .Marland Kitchen.. 1 Tab By Mouth Two Times A Day.  Do Not Miss Doses 3)  Adult Aspirin Ec Low Strength 81 Mg  Tbec (Aspirin) .Marland Kitchen.. 1 Tab Daily 4)  Nitroglycerin 0.4 Mg  Subl (Nitroglycerin) .... Place One Under Tongue For Chest Pain 5)  Toprol Xl 50 Mg Xr24h-Tab (Metoprolol Succinate) .Marland Kitchen.. 1 Tab By Mouth Daily. 6)  Miralax  Powd (Polyethylene Glycol 3350) .... Use As Directed 7)  Pravastatin Sodium 40 Mg Tabs (Pravastatin Sodium) .... 2 By Mouth At Bedtime For Cholesterol 8)  Metamucil 30.9 % Powd (Psyllium) .... As Needed 9)  Zyban 150 Mg Xr12h-Tab (Bupropion Hcl (Smoking Deter)) .... Take One Each Morning For Three Days. Then Increas Dose To One Each Morning and Second Dose 8 Hours Later. 10)  Nicotrol 10 Mg Inha (Nicotine) .... Use Up To 5 Cartridges Per Day. Dispense One Box of 168. 11)  Ibuprofen 800 Mg Tabs (Ibuprofen) .... Take 1 Tab in Am and 1 Tab in Pm With Food  Allergies: 1)  ! 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  Monogamous relationship 6 mos.  ; Smoking at least 1 ppd since age 49 ; Uses EtOH in moderation. No illicit drugs.  Drinks lots of caffeine.; Has 3 grown children.  Risk Factors: Smoking Status: current (06/11/2009) Packs/Day: 1.0 (05/21/2009) Passive Smoke Exposure: yes (05/21/2009)  Review of Systems       negative history of present illness  Vital Signs:  Patient profile:   60 year old female Height:      64 inches Weight:      166 pounds BMI:     28.60 Pulse rate:   76 / minute Pulse rhythm:   regular BP sitting:   126 / 80  (left arm) Cuff size:   large  Vitals Entered By: Lauren [name], CMA (June 25, 2009 2:46 PM)  Physical Exam  General:  Well developed, well nourished, in no acute distress. Head:  normocephalic and atraumatic Eyes:  PERRLA/EOM intact; conjunctiva and lids normal. Heart:  Non-displaced PMI, chest non-tender; regular rate and rhythm, S1, S2 without murmurs, rubs or gallops. Carotid  upstroke normal, no bruit. Normal abdominal aortic size, no bruits. Femorals normal pulses, no bruits. Pedals normal pulses. No edema, no varicosities. Skin:  Intact without lesions or rashes. Psych:  depressed affect.     Impression & Recommendations:  Problem # 1:  CAD (ICD-414.00) Assessment Unchanged  Her updated medication list for this problem includes:    Adult Aspirin Ec Low Strength 81 Mg Tbec (Aspirin) .Marland Kitchen... 1 tab daily    Nitroglycerin 0.4 Mg Subl  (Nitroglycerin) .Marland Kitchen... Place one under tongue for chest pain    Toprol Xl 50 Mg Xr24h-tab (Metoprolol succinate) .Marland Kitchen... 1 tab by mouth daily.  Problem # 2:  TOBACCO ABUSE (ICD-305.1) Assessment: Unchanged patient counseled to quit  Problem # 3:  HYPERCHOLESTEROLEMIA (ICD-272.0) Assessment: Deteriorated LDL is 151. She forgets to take her pravastatin every night. She is on a maximum dose. Reinforced her to take it nightly. Her updated medication list for this problem includes:    Pravastatin Sodium 40 Mg Tabs (Pravastatin sodium) .Marland Kitchen... 2 by mouth at bedtime for cholesterol  Patient Instructions: 1)  Your physician recommends that you schedule a follow-up appointment in: YEARWITH DR Gwendy Boeder 2)  Your physician recommends that you continue on your current medications as directed. Please refer to the Current Medication list given to you today.

## 2010-06-19 NOTE — Assessment & Plan Note (Signed)
Summary: Behavioral Medicine Follow-up   Primary Care Provider:  Alvia Grove DO   History of Present Illness: Lauren [name] presents on the heels of some news about a possible tumor in her lung.  She goes for biopsy on 04/02/10 and has a follow-up appt on the 21st, presumably to discuss the results.  She says she is doing fine.  She is not "claiming this" which loosely translates to not worrying about at least until she has further information.  She has told her sisters in spirit and a few of her family members.  She has gotten support.  About 3/4 of the way through our meeting, Lauren [name] volunteered that she is feeling nervous about the biopsy and what they will find.    Discussed relationships in general and how that is going.  It is a changing landscape and Lauren [name] reports being very clear about her boundaries.  Allergies: 1)  ! Lisinopril   Impression & Recommendations:  Problem # 1:  DEPRESSIVE DISORDER, MAJOR, RCR, MILD (ICD-296.31)  Report of mood is good.  Affect seems a bit more flat than waht is typical for her.  Thoughts are clear and goal directed.  Due to her psychological functioning or religious beliefs, worrying about things such as these (or saying she is anxious) is not okay.  She did report some concern when allowed enough room.  Hopefully we talked through it in a way that gave voice to her concerns and she felt supported.  Of note, I asked her what the most and least helpful thing was that someone said in response to her news.  She said, "STOP SMOKING."  She is firmly in precontemplation and voices that she may never quit smoking.  Will continue to approach in a non-judgmental making sure to demonstrate respect for her autonomy.  Utlimately, it is her decision.    She elected to schedule an appt on the 22nd - the day after her follow-up with her specialist.  I told her she could call in between if need be.  Orders: Therapy 40-50- min- [facility] ([address])  Complete Medication  List: 1)  Hydrochlorothiazide 25 Mg Tabs (Hydrochlorothiazide) .Marland Kitchen.. 1 by mouth once daily for blood pressure 2)  Catapres 0.1 Mg Tabs (Clonidine hcl) .Marland Kitchen.. 1 tab by mouth two times a day.  do not miss doses 3)  Adult Aspirin Ec Low Strength 81 Mg Tbec (Aspirin) .Marland Kitchen.. 1 tab daily 4)  Nitroglycerin 0.4 Mg Subl (Nitroglycerin) .... Place one under tongue for chest pain 5)  Toprol Xl 50 Mg Xr24h-tab (Metoprolol succinate) .Marland Kitchen.. 1 tab by mouth daily. 6)  Miralax Powd (Polyethylene glycol 3350) .... Use as directed 7)  Pravastatin Sodium 80 Mg Tabs (Pravastatin sodium) .Marland Kitchen.. 1 by mouth at bedtime for cholesterol 8)  Citalopram Hydrobromide 40 Mg Tabs (Citalopram hydrobromide) .Marland Kitchen.. 1 by mouth once daily for depression/anxiety 9)  Mupirocin 2 % Oint (Mupirocin) .... As needed.   Orders Added: 1)  Therapy 40-50- min- [facility] [[id number]]

## 2010-06-19 NOTE — Consult Note (Signed)
Summary: [facility]  [facility]   Imported By: Clydell Hakim 12/23/2009 15:12:59  _____________________________________________________________________  External Attachment:    Type:   Image     Comment:   External Document

## 2010-06-19 NOTE — Assessment & Plan Note (Signed)
Summary: Behavioral Medicine Follow-up   Primary Care Provider:  Alvia Grove DO   History of Present Illness: Lauren [name] had wanted to meet with Dr. Gomez Cleverly and me together - she said to decrease the number of doctor's appts she was attending.  Not sure if there was an additional motivation, however, Dr. Gomez Cleverly and I were not able to align our schedules.  Met with Lauren [name] alone and discussed her adjustment to the news of her lung cancer, her plans for the holidays, smoking cessation and financial stress.  Allergies: 1)  ! Lisinopril   Impression & Recommendations:  Problem # 1:  DEPRESSIVE DISORDER, MAJOR, RCR, MILD (ICD-296.31)  Report of mood is good.  Affect is more irritable than usual.  She seems much more reactive / judgemental than in times past.  I fed this back to her but it didn't really open any doors.  Not sure if she presents differently with me - she did express her concern about the aneurysm but was able to state what she heard from Dr. Gomez Cleverly and she seems reassured.  She touched on the issue of death today in a round about way.  Addressing this more directly - specifically with regards to her religious beliefs - might be useful in the future (with her permission of course).  Orders: Therapy 40-50- min- [facility] ([address])  Problem # 2:  TOBACCO ABUSE (ICD-305.1)  Preparaton / action phase.  She was quit for four days but relapsed.  Discussed physiological vs. psychological dependence.  I take a very patient-centered approach with her regarding cessation - wanting her to view me as a support as I suspect she is hearing about smoking cessation from multiple providers.  She seems to be focusing on the physiological addiction.  Offered to talk to her about strategies for the psych portion if she was interested.  Will keep addressing.  Orders: Therapy 40-50- min- [facility] ([address])  Complete Medication List: 1)  Hydrochlorothiazide 25 Mg Tabs (Hydrochlorothiazide) .Marland Kitchen.. 1 by mouth once  daily for blood pressure 2)  Catapres 0.1 Mg Tabs (Clonidine hcl) .... One at bedtime 3)  Adult Aspirin Ec Low Strength 81 Mg Tbec (Aspirin) .Marland Kitchen.. 1 tab daily 4)  Nitroglycerin 0.4 Mg Subl (Nitroglycerin) .... Place one under tongue for chest pain 5)  Pravastatin Sodium 80 Mg Tabs (Pravastatin sodium) .Marland Kitchen.. 1 by mouth at bedtime for cholesterol 6)  Citalopram Hydrobromide 40 Mg Tabs (Citalopram hydrobromide) .Marland Kitchen.. 1 by mouth once daily for depression/anxiety 7)  Bystolic 10 Mg Tabs (Nebivolol hcl) .... One tablet daily 8)  Ambien 10 Mg Tabs (Zolpidem tartrate) .... Take 1 pill by mouth at bedtime   Orders Added: 1)  Therapy 40-50- min- [facility] [[id number]]

## 2010-06-19 NOTE — Letter (Signed)
Summary: Generic Letter  [facility]  [address]   [place], [place] [address]   Phone: [phone]  Fax: [phone]    02/28/2010  Lauren [name] [address] [place], [place]  [address]  Dear Ms. Lauren [name],   I have reviewed your labs and chest xray from your last office visit.  Your chest xray looked normal.  Your blood work looked good as well except for a mildly elevated cholesterol level.  Let's plan to talk about that at your next visit and discuss some options for improving that number.  Please call my office with any questions.         Sincerely,   Lauren Grove DO   Appended Document: Generic Letter mailed.

## 2010-06-19 NOTE — Progress Notes (Signed)
Summary: Schedule beh -med  Phone Note Call from Patient   Caller: Patient Call For: Spero Geralds, Psy.D. Summary of Call: Patient called to schedule an appt.  She has lost a significant amount of weight and her sister thinks it is a mental problem.  Brelee promised her sister she would make an appt.  Scheduled for 03/06/10 at 2:00.  She meets her new PCP, Dr. Gomez Cleverly, the week before. Initial call taken by: Spero Geralds PsyD,  February 25, 2010 9:12 AM

## 2010-06-19 NOTE — Progress Notes (Signed)
Summary: phn msg  Phone Note Call from Patient Call back at [facility] Phone [phone]   Caller: Patient Summary of Call: Pt would like to get a second opinion for her back.  Can this be arranged for her. Initial call taken by: Clydell Hakim,  Oct 04, 2009 2:15 PM  Follow-up for Phone Call        okay to send notes to another neurosurgeon.  please try to expedite appt. (within next wk if possible) Follow-up by: Ancil Boozer  MD,  Oct 04, 2009 4:04 PM  Additional Follow-up for Phone Call Additional follow up Details #1::        Referral faxed to [facility], will call and check on this later this afternoon. Additional Follow-up by: Garen Grams LPN,  Oct 07, 2009 10:40 AM    Additional Follow-up for Phone Call Additional follow up Details #2::    Appt scheduled with [facility] for 10/10/09 at 930am with Dr. Wynetta Emery, patient informed Follow-up by: Garen Grams LPN,  Oct 08, 2009 11:46 AM

## 2010-06-19 NOTE — Assessment & Plan Note (Signed)
Summary: weight loss/no appetite/bmc   Vital Signs:  Patient profile:   60 year old female Height:      64 inches Weight:      148.2 pounds BMI:     25.53 Temp:     98.5 degrees F oral Pulse rate:   56 / minute BP sitting:   143 / 75  (left arm) Cuff size:   regular  Vitals Entered By: Jimmy Footman, CMA (February 27, 2010 9:50 AM)  Primary Care Provider:  Alvia Grove DO  CC:  weight loss.  History of Present Illness: 60 yo female here for concern of recent weight loss.  Pt has lost about 9 lbs since June, unintentional.  Pt states multiple stressors in her life, nothing new, just  continuation of the same.  Wonders if this may be caused by her depression?  Already has a f/u appt scheduled with Dr. Pascal Lux for her depression. Further into the conversation pt states she does not have enough money to buy food.  She gets food stamps and is on disability, but at the end of the month she doesn't feel she has enough money to buy food.  However, she does continue to smoke a pack of cigerettes daily.  She usually eats 1 meal daily and then drinks sodas thruout the day.  Has considered going to [employer] for meals, but states she has 'too much pride to follow thru with it.' She denies nausea or vomitting and feels she has trained her body to only expect 1 meal a day.   Also requesting HIV testing today as she has recently had unprotected intercourse.          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 1 By Mouth Once Daily For Blood Pressure 2)  Catapres 0.1 Mg  Tabs (Clonidine Hcl) .Marland Kitchen.. 1 Tab By Mouth Two Times A Day.  Do Not Miss Doses 3)  Adult Aspirin Ec Low Strength 81 Mg  Tbec (Aspirin) .Marland Kitchen.. 1 Tab Daily 4)  Nitroglycerin 0.4 Mg  Subl (Nitroglycerin) .... Place One Under Tongue For Chest Pain 5)  Toprol Xl 50 Mg Xr24h-Tab (Metoprolol Succinate) .Marland Kitchen.. 1 Tab By Mouth Daily. 6)  Miralax  Powd (Polyethylene Glycol 3350) .... Use As Directed 7)  Pravastatin Sodium 80 Mg Tabs (Pravastatin Sodium) .Marland Kitchen.. 1 By Mouth At Bedtime For Cholesterol 8)  Citalopram Hydrobromide 40 Mg  Tabs (Citalopram Hydrobromide) .Marland Kitchen.. 1 By Mouth Once Daily For Depression/anxiety 9)  Mupirocin 2 % Oint (Mupirocin) .... As Needed.  Allergies (verified): 1)  ! 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,  trichomonas vaginalis treated 05/2005 left-sided colon cancer, T2 lesion, status post resection in 2008 did not need chemotherapy and radiation POSTMENOPAUSAL SYNDROME (ICD-627.9) COPD UNSPECIFIED  (ICD-496) TOBACCO ABUSE (ICD-305.1) DEPRESSIVE DISORDER, MAJOR, RCR, MILD (ICD-296.31) - has done well with addition of SSRI + counseling from Dr Pascal Lux HYPERCHOLESTEROLEMIA (ICD-272.0) GERD (ICD-530.81) CEREBRAL ANEURYSM (ICD-437.3) - see notes scanned in from [facility] HYPERTENSION, BENIGN ESSENTIAL (ICD-401.1) back pain with h/o herniated disc  2011  Past Surgical History: Last updated: 10/29/2009 left-sided colon cancer resection, 2008.xc lumbar spine surgery for herniated disc 2011   Family History: Last updated: 07/15/2006 Cousin w/breast CA, sister w/lymphatic CA., Father--living w/ OA, gout, HTN, HTN runs in family, Mother died of heart dz/enlarged heart/pericarditis at 62 yo., No FHx of DM  Social History: Last updated: 02/27/2010 Widowed; 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  ; Smoking at up to 1 ppd since age 28 ; Uses EtOH in moderation. No illicit drugs.  Drinks lots of caffeine.; Has 3 grown children but one has had some trouble financially.    Risk Factors: Smoking Status: current (02/27/2010) Packs/Day: 1.0 (02/27/2010) Passive Smoke Exposure: yes (05/21/2009)  Family History: Reviewed history from 07/15/2006 and no changes required. Cousin w/breast CA, sister w/lymphatic CA., Father--living w/ OA, gout, HTN, HTN runs in family, Mother died of heart dz/enlarged heart/pericarditis at 62 yo., No FHx of DM  Social History: Reviewed history from 08/12/2009 and no changes required. 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  ; Smoking at up to 1 ppd since age 11 ; Uses EtOH in moderation. No illicit drugs.  Drinks lots of caffeine.; Has 3 grown children but one has had some trouble financially.  Packs/Day:  1.0 STD Risk:  current  Review of Systems       The patient complains of weight loss.  The patient denies anorexia, fever, hoarseness, chest pain, syncope, prolonged  cough, hemoptysis, abdominal pain, melena, hematochezia, hematuria, incontinence, genital sores, suspicious skin lesions, difficulty walking, depression, abnormal bleeding, enlarged lymph nodes, and breast masses.    Physical Exam  General:  Vital signs reviewed Well-developed, well-nourished patient in NAD.  Awake, cooperative.  Neck:  supple, full ROM, no masses, no thyromegaly, and no thyroid nodules or tenderness.   Lungs:  Normal respiratory effort, chest expands symmetrically. Lungs are clear to auscultation, no crackles or wheezes. Heart:  Normal rate and regular rhythm. S1 and S2 normal without gallop, murmur, click, rub or other extra sounds. Abdomen:  soft, non-tender, and normal bowel sounds.  no distention, no masses, and no guarding.   Neurologic:  alert & oriented X3.   Skin:  turgor normal, color normal, no rashes, and no suspicious lesions.   Cervical Nodes:  No lymphadenopathy noted Axillary Nodes:  No palpable lymphadenopathy Psych:  Oriented X3, memory intact for recent and remote, good eye contact, not anxious appearing, not depressed appearing, and not agitated.      Prevention & Chronic Care Immunizations   Influenza vaccine: Fluvax  MCR  (02/27/2010)   Influenza vaccine due: 02/12/2009    Tetanus booster: 01/16/2005: Done.   Tetanus booster due: 01/17/2015    Pneumococcal vaccine: Not documented    H. zoster vaccine: Not documented  Colorectal Screening   Hemoccult: Not documented   Hemoccult due: Not Indicated    Colonoscopy: Location:  [facility].    (02/29/2008)   Colonoscopy due: 03/01/2011  Other Screening   Pap smear: normal  (06/29/2007)   Pap smear due: 06/28/2010    Mammogram: normal  (07/08/2007)   Mammogram due: 07/07/2009    DXA bone density scan: Not documented   Smoking status: current  (02/27/2010)   Smoking cessation counseling: yes  (05/21/2009)  Lipids   Total Cholesterol: 178  (08/21/2009)   Lipid panel  action/deferral: Lipid Panel ordered   LDL: 122  (08/21/2009)   LDL Direct: 151  (04/23/2009)   HDL: 32  (08/21/2009)   Triglycerides: 118  (08/21/2009)    SGOT (AST): 12  (04/23/2009)   SGPT (ALT): <8 U/L  (04/23/2009) CMP ordered    Alkaline phosphatase: 119  (04/23/2009)   Total bilirubin: 0.5  (04/23/2009)    Lipid flowsheet reviewed?: Yes   Progress toward LDL goal: Improved  Hypertension   Last Blood Pressure: 143 / 75  (02/27/2010)   Serum creatinine: 0.83  (04/23/2009)   Serum potassium 4.2  (04/23/2009) CMP ordered     Hypertension flowsheet reviewed?: Yes   Progress toward BP goal: Improved  Self-Management Support :   Personal Goals (by the next clinic visit) :      Personal blood pressure goal: 140/90  (01/31/2009)     Personal LDL goal: 130  (01/31/2009)    Hypertension self-management support: BP self-monitoring log, Written self-care plan, Education handout  (04/23/2009)    Hypertension self-management support not done because: Good outcomes  (08/12/2009)    Lipid self-management support: Written self-care plan, Education handout, Pre-printed educational material  (04/23/2009)     Lipid self-management support not done because: Refused  (08/12/2009)   Nursing Instructions: Give Flu vaccine today   Impression & Recommendations:  Problem # 1:  WEIGHT LOSS, RECENT (ZOX-096.04) Assessment Deteriorated Discussed plan with attending: Only lost 9 punds in the past 4 months; however, given the pt's history of colon cancer, family history of breast cancer and current smoking status, reasonable to check labs today, specifically hemaglobin (for anemia) and WBC (infection), TSH  (evaluate thyroid function), CMP (monitor electrolytes, liver function and kidney function) and a chest xray to check for nodules, masses or effusions.  Likely cause of weight loss due to poor quality/quantity of food and fluid intake.  Encouraged pt to follow up with [employer].    Agree with pt's plan to f/u with Dr. Pascal Lux see pt instructions Orders: TSH-[facility] ([id number]) CXR- 2view (CXR) [facility]- Est  Level 4 ([address])  Problem # 2:  CONTACT OR EXPOSURE TO OTHER VIRAL DISEASES (ICD-V01.79) check HIV. Counseled pt on neccesity of protection, in the form of condoms, to minimize risk of STD's and HIV. Provided her with condoms.  Orders: HIV-[facility] ([id number]) [facility]- Est  Level 4 ([address]62)  Problem # 3:  TOBACCO ABUSE (ICD-305.1) counseled to quit, pt contemplative.   Orders: [facility]- Est  Level 4 ([address])  Problem # 4:  DEPRESSIVE DISORDER, MAJOR, RCR, MILD (ICD-296.31) Denies SI or HI thoughts. to f/u with Dr. Pascal Lux next week Orders: [facility]- Est  Level 4 ([address])  Problem # 5:  HYPERCHOLESTEROLEMIA (ICD-272.0) check LDL today Her updated  medication list for this problem includes:    Pravastatin Sodium 80 Mg Tabs (Pravastatin sodium) .Marland Kitchen... 1 by mouth at bedtime for cholesterol  Orders: Direct LDL-[facility] ([id number])  Problem # 6:  PERSONAL HX COLON CANCER (ICD-V10.05) need repeat colonoscopy in 2012.   Complete Medication List: 1)  Hydrochlorothiazide 25 Mg Tabs (Hydrochlorothiazide) .Marland Kitchen.. 1 by mouth once daily for blood pressure 2)  Catapres 0.1 Mg Tabs (Clonidine hcl) .Marland Kitchen.. 1 tab by mouth two times a day.  do not miss doses 3)  Adult Aspirin Ec Low Strength 81 Mg Tbec (Aspirin) .Marland Kitchen.. 1 tab daily 4)  Nitroglycerin 0.4 Mg Subl (Nitroglycerin) .... Place one under tongue for chest pain 5)  Toprol Xl 50 Mg Xr24h-tab (Metoprolol succinate) .Marland Kitchen.. 1 tab by mouth daily. 6)  Miralax Powd (Polyethylene glycol 3350) .... Use as directed 7)  Pravastatin Sodium 80 Mg Tabs (Pravastatin sodium) .Marland Kitchen.. 1 by mouth at bedtime for cholesterol 8)  Citalopram Hydrobromide 40 Mg Tabs (Citalopram hydrobromide) .Marland Kitchen.. 1 by mouth once daily for depression/anxiety 9)  Mupirocin 2 % Oint (Mupirocin) .... As needed.  Other Orders: Comp Met-[facility] [phone]) CBC-[facility] ([id number]) Influenza Vaccine MCR  [phone])  Patient Instructions: 1)  Nice to meet you today. 2)  I will check some blood work today and call you if anything is abnormal. 3)  Make sure you keep your appt with Dr. Pascal Lux. 4)  I would like you to get a chest xray today 5)  Please go to [employer] for assistance with food 6)  Please schedule a follow-up appointment in 2 weeks.    Immunizations Administered:  Influenza Vaccine # 1:    Vaccine Type: Fluvax MCR    Site: left deltoid    Mfr: [email or web address]    Dose: 0.5 ml    Route: IM    Given by: Jone Baseman CMA    Exp. Date: 11/12/2010    Lot #: [id number]    VIS given: 12/10/09 version given February 27, 2010.  Flu Vaccine Consent Questions:    Do you have a history of severe allergic reactions to this vaccine? no    Any prior history of allergic reactions to egg and/or gelatin? no    Do you have a sensitivity to the preservative Thimersol? no    Do you have a past history of Guillan-Barre Syndrome? no    Do you currently have an acute febrile illness? no    Have you ever had a severe reaction to latex? no    Vaccine information given and explained to patient? yes    Are you currently pregnant? no

## 2010-06-19 NOTE — Assessment & Plan Note (Signed)
Summary: PFTs - Tobacco Abuse - Rx Clinic   Vital Signs:  Patient profile:   60 year old female Height:      64 inches Weight:      165 pounds BMI:     28.42 Pulse rate:   62 / minute BP sitting:   140 / 77  (right arm)  Primary Care Provider:  Ancil Boozer  MD   History of Present Illness:  Patient arrives for spirometry testing.  She is currently in the preparation/action phase of smoking cessation.  She recently attempted to quit smoking 05/19/2008 however she only made it 6 hours.  She made it hour by hour until Aram Beecham called her and asked her to go to the store together.  She stopped bupropion, nicotine inhaler when she restarted smoking.   She believes she needs to avoid other smokers when she is trying to quit and abstain from smoking.  She reports that she needs to be busy to stay quit.  She has quit for as long as 1 week in the past.   She has cut down to smoking only pack by pack.    Reports taking clonidine ONLY ONCE Daily.  Now understands she needs to take this twice daily.      She reports shortness of breath only with significant exertion.   Patient is minimally using nitroglycerin.  Habits & Providers  Alcohol-Tobacco-Diet     Tobacco Status: current     Tobacco Counseling: to quit use of tobacco products     Cigarette Packs/Day: 1.0     Year Started: 1967     Passive Smoke Exposure: yes  Current Medications (verified): 1)  Hydrochlorothiazide 25 Mg Tabs (Hydrochlorothiazide) .Marland Kitchen.. 1 By Mouth Once Daily For Blood Pressure 2)  Catapres 0.1 Mg  Tabs (Clonidine Hcl) .Marland Kitchen.. 1 Tab By Mouth Two Times A Day.  Do Not Miss Doses 3)  Adult Aspirin Ec Low Strength 81 Mg  Tbec (Aspirin) .Marland Kitchen.. 1 Tab Daily 4)  Nitroglycerin 0.4 Mg  Subl (Nitroglycerin) .... Place One Under Tongue For Chest Pain 5)  Toprol Xl 50 Mg Xr24h-Tab (Metoprolol Succinate) .Marland Kitchen.. 1 Tab By Mouth Daily. 6)  Miralax  Powd (Polyethylene Glycol 3350) .... Use As Directed 7)  Pravastatin Sodium 40 Mg Tabs  (Pravastatin Sodium) .... 2 By Mouth At Bedtime For Cholesterol 8)  Metamucil 30.9 % Powd (Psyllium) .... As Needed 9)  Zyban 150 Mg Xr12h-Tab (Bupropion Hcl (Smoking Deter)) .... Take One Each Morning For Three Days. Then Increas Dose To One Each Morning and Second Dose 8 Hours Later. 10)  Nicotrol 10 Mg Inha (Nicotine) .... Use Up To 5 Cartridges Per Day. Dispense One Box of 168.  Allergies (verified): 1)  ! Lisinopril  Social History: Passive Smoke Exposure:  yes   Impression & Recommendations:  Problem # 1:  COPD UNSPECIFIED (ICD-496) Assessment Unchanged Pt is a 60yo female seen in clinic today for spirometry and smoking cessation f/u.  "Normal" spirometry and no significant improvements after albuterol neb tx according to PFT report but lung age reported as 89 years, indicating probable lung function deterioration due to chronic tobacco abuse.  Discussed the report and significance of results with pt and she verbalized understanding.  Discussed smoking cessation and starting exercise to improve lung function.   Orders: Albuterol Sulfate Sol 1mg  unit dose (Z6109) PFT Baseline-Pre/Post Bronchodiolator (PFT Baseline-Pre/Pos)  Problem # 2:  TOBACCO ABUSE (ICD-305.1) Assessment: Unchanged  Mrs. Brymer recently attended smoking cessation group class (05/07/09), where  she developed a quit plan and set a quit date of 05/19/09.  At this time she was prescribed Zyban and nicotine inhaler as part of her quit plan.  She did pick these up from pharmacy and began using them appropriately.  On her quit date she was successful for a full 6 hours, after which time she began to smoke again.  Pt remains interested in quiting, reports feeling guilty for resuming smoking, and is willing to try again.  Since previous quit date, the patient has smoked < 1ppd (improved).  A new quit plan has been initiated.  She will begin to taper down number of cigaretted smoked per day until she is down to 6-8  cigarettes/day within the next 2-3 weeks.  She will also restart Zyban to help with cravings during this taper.  Pt has agreed to attend next smoking cessation group class and we will continue crafting a quit plan at that time.  Total time spent with patient of 60 minutes.  Pt seen with Eda Keys, PharmD Resident, Massie Bougie PharmD Candidate, and Ronald Lobo Medical  Student.    Her updated medication list for this problem includes:    Zyban 150 Mg Xr12h-tab (Bupropion hcl (smoking deter)) .Marland Kitchen... Take one each morning for three days. then increas dose to one each morning and second dose 8 hours later.    Nicotrol 10 Mg Inha (Nicotine) ..... Use up to 5 cartridges per day. dispense one box of 168.  Orders: PFT Baseline-Pre/Post Bronchodiolator (PFT Baseline-Pre/Pos)  Complete Medication List: 1)  Hydrochlorothiazide 25 Mg Tabs (Hydrochlorothiazide) .Marland Kitchen.. 1 by mouth once daily for blood pressure 2)  Catapres 0.1 Mg Tabs (Clonidine hcl) .Marland Kitchen.. 1 tab by mouth two times a day.  do not miss doses 3)  Adult Aspirin Ec Low Strength 81 Mg Tbec (Aspirin) .Marland Kitchen.. 1 tab daily 4)  Nitroglycerin 0.4 Mg Subl (Nitroglycerin) .... Place one under tongue for chest pain 5)  Toprol Xl 50 Mg Xr24h-tab (Metoprolol succinate) .Marland Kitchen.. 1 tab by mouth daily. 6)  Miralax Powd (Polyethylene glycol 3350) .... Use as directed 7)  Pravastatin Sodium 40 Mg Tabs (Pravastatin sodium) .... 2 by mouth at bedtime for cholesterol 8)  Metamucil 30.9 % Powd (Psyllium) .... As needed 9)  Zyban 150 Mg Xr12h-tab (Bupropion hcl (smoking deter)) .... Take one each morning for three days. then increas dose to one each morning and second dose 8 hours later. 10)  Nicotrol 10 Mg Inha (Nicotine) .... Use up to 5 cartridges per day. dispense one box of 168.  Patient Instructions: 1)  Lung Function Test showed some decrease in lung function.  It is important to quit smoking in the near future.  2)  Restart taking Bupropion one daily for  three days THEN increase to twice daily.    3)  Try to cut down cigarettes to 15 or less for the rest of the week.  4)  Next week try to smoke less than 12 cigarettes per day. 5)  In two weeks try to smoke less than 10 cigarettes per day.  6)  Stay Busy, Be Active - Walk as much as possible.  7)  See you in a few weeks at the smoking cessation. 4th Tuesday.    Pulmonary Function Test Date: 05/21/2009 Height (in.): 64 Gender: Female  Pre-Spirometry FVC    Value: 2.05 L/min   Pred: 2.57 L/min     % Pred: 79 % FEV1    Value: 1.44 L  Pred: 2.08 L     % Pred: 69 % FEV1/FVC  Value: 70 %     Pred: 82 %     % Pred: 85 % FEF 25-75  Value: 0.87 L/min   Pred: 2.24 L/min     % Pred: 38 %  Post-Spirometry FVC    Value: 2.26 L/min   Pred: 2.57 L/min     % Pred: 87 % FEV1    Value: 1.50 L     Pred: 2.08 L     % Pred: 71 % FEV1/FVC  Value: 66 %     Pred: 82 %     % Pred: 80 % FEF 25-75  Value: 0.77 L/min   Pred: 2.24 L/min     % Pred: 34 %  Comments: Effort was good.   Lung Age was calculated as 89 years. COPD risk was 70% if continues to smoke and 50% if she stops smoking.  Evaluation: near normal - No significant bronchodilator response  Recommendations: Needs to quit smoking.     Medication Administration  Medication # 1:    Medication: Albuterol Sulfate Sol 1mg  unit dose    Diagnosis: COPD UNSPECIFIED (ICD-496)    Dose: 2.5mg / 3ml    Route: inhaled    Exp Date: 11/2010    Lot #: [id number]    Mfr: nephron    Patient tolerated medication without complications    Given by: Tessie Fass CMA (May 21, 2009 12:23 PM)  Orders Added: 1)  Albuterol Sulfate Sol 1mg  unit dose [E4540] 2)  PFT Baseline-Pre/Post Bronchodiolator [PFT Baseline-Pre/Pos]  Prevention & Chronic Care Immunizations   Influenza vaccine: Fluvax 3+  (04/23/2009)   Influenza vaccine due: 02/12/2009    Tetanus booster: 01/16/2005: Done.   Tetanus booster due: 01/17/2015    Pneumococcal vaccine: Not  documented    H. zoster vaccine: Not documented  Colorectal Screening   Hemoccult: Not documented   Hemoccult due: Not Indicated    Colonoscopy: Location:  [name].    (02/29/2008)   Colonoscopy due: 03/01/2011  Other Screening   Pap smear: normal  (06/29/2007)   Pap smear due: 06/28/2010    Mammogram: normal  (07/08/2007)   Mammogram due: 07/2008    DXA bone density scan: Not documented   Smoking status: current  (05/21/2009)   Smoking cessation counseling: yes  (05/21/2009)   Target quit date: 06/11/2009  (05/21/2009)  Lipids   Total Cholesterol: 225  (05/07/2008)   LDL: 154  (05/07/2008)   LDL Direct: 151  (04/23/2009)   HDL: 45  (05/07/2008)   Triglycerides: 128  (05/07/2008)    SGOT (AST): 12  (04/23/2009)   SGPT (ALT): <8 U/L  (04/23/2009)   Alkaline phosphatase: 119  (04/23/2009)   Total bilirubin: 0.5  (04/23/2009)    Lipid flowsheet reviewed?: Yes   Progress toward LDL goal: Unchanged  Hypertension   Last Blood Pressure: 140 / 77  (05/21/2009)   Serum creatinine: 0.83  (04/23/2009)   Serum potassium 4.2  (04/23/2009)    Hypertension flowsheet reviewed?: Yes   Progress toward BP goal: Improved  Self-Management Support :   Personal Goals (by the next clinic visit) :      Personal blood pressure goal: 140/90  (01/31/2009)     Personal LDL goal: 130  (01/31/2009)    Hypertension self-management support: BP self-monitoring log, Written self-care plan, Education handout  (04/23/2009)    Hypertension self-management support not done because: Good outcomes  (05/21/2009)    Lipid  self-management support: Written self-care plan, Education handout, Pre-printed educational material  (04/23/2009)     Lipid self-management support not done because: Not indicated  (01/31/2009)

## 2010-06-19 NOTE — Assessment & Plan Note (Signed)
Summary: fu/kh   Vital Signs:  Patient profile:   60 year old female Height:      64 inches Weight:      157 pounds BMI:     27.05 Temp:     98.8 degrees F oral Pulse rate:   60 / minute BP sitting:   136 / 75  (right arm) Cuff size:   regular  Vitals Entered By: Tessie Fass CMA (September 10, 2009 2:07 PM) CC: F/U depression. weight Is Patient Diabetic? No Pain Assessment Patient in pain? no        Primary Care Provider:  Ancil Boozer  MD  CC:  F/U depression. weight.  History of Present Illness: depression: overall feels much better.  better mood overall, addressing issues in her life (mainly around [place]).  No longer having frequent crying spells.  Appetite however is still very poor. denies suicidal ideation.  has done a lot of praying which she states has helped.   she also states it was very helpful getting back into counseling and she has an appt on the 28th of this month for follow up.  weight: has noticed with poor appetite she also feels full very quickly.  with this she is having irregular bowel movements.  she finds herself sometimes completely forgetting to eat for an entire day. she is concerned it could mean her cancer has returned.  she does report she has an appt with her oncologist Dr Gaylyn Rong on May 5th.   Habits & Providers  Alcohol-Tobacco-Diet     Tobacco Status: current     Tobacco Counseling: to quit use of tobacco products     Cigarette Packs/Day: 0.75  Current Medications (verified): 1)  Hydrochlorothiazide 25 Mg Tabs (Hydrochlorothiazide) .Marland Kitchen.. 1 By Mouth Once Daily For Blood Pressure 2)  Catapres 0.1 Mg  Tabs (Clonidine Hcl) .Marland Kitchen.. 1 Tab By Mouth Two Times A Day.  Do Not Miss Doses 3)  Adult Aspirin Ec Low Strength 81 Mg  Tbec (Aspirin) .Marland Kitchen.. 1 Tab Daily 4)  Nitroglycerin 0.4 Mg  Subl (Nitroglycerin) .... Place One Under Tongue For Chest Pain 5)  Toprol Xl 50 Mg Xr24h-Tab (Metoprolol Succinate) .Marland Kitchen.. 1 Tab By Mouth Daily. 6)  Miralax  Powd (Polyethylene  Glycol 3350) .... Use As Directed 7)  Simvastatin 80 Mg Tabs (Simvastatin) .Marland Kitchen.. 1 By Mouth At Bedtime For Cholesterol. 8)  Citalopram Hydrobromide 40 Mg Tabs (Citalopram Hydrobromide) .Marland Kitchen.. 1 By Mouth Once Daily For Depression/anxiety  Allergies (verified): 1)  ! Lisinopril  Past History:  Past medical, surgical, family and social histories (including risk factors) reviewed for relevance to current acute and chronic problems.  Past Medical History: Reviewed history from 08/12/2009 and no changes required. 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, trichomonas vaginalis treated 05/2005  left-sided colon cancer, T2 lesion, status post resection in 2008 did not need chemotherapy and radiation  HEADACHE (ICD-784.0) POSTMENOPAUSAL SYNDROME (ICD-627.9) FATIGUE (ICD-780.79) COUGH (ICD-786.2) PAIN IN JOINT OTHER SPECIFIED SITES (ICD-719.48) COPD UNSPECIFIED (ICD-496) SEXUAL ACTIVITY, HIGH RISK (ICD-V69.2) CONSTIPATION (ICD-564.00) PERSONAL HX COLON CANCER (ICD-V10.05) CANCER, COLORECTAL (ICD-154.0) TOBACCO ABUSE (ICD-305.1) MITRAL VALVE PROLAPSE, HX OF (ICD-V12.50) CAD (ICD-414.00) DEPRESSIVE DISORDER, MAJOR, RCR, MILD (ICD-296.31) HYPERCHOLESTEROLEMIA (ICD-272.0) GERD (ICD-530.81) CEREBRAL ANEURYSM (ICD-437.3) HYPERTENSION, BENIGN ESSENTIAL (ICD-401.1)  Past Surgical History: Reviewed history from 02/27/2008 and no changes required. left-sided colon cancer resection, 2008.  Family History: Reviewed  history from 07/15/2006 and no changes required. Cousin w/breast CA, sister w/lymphatic CA., Father--living w/ OA, gout, HTN, HTN runs in family, Mother died of heart dz/enlarged heart/pericarditis at 62 yo., No FHx of DM  Social History: Reviewed history from 08/12/2009 and no changes  required. 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  Monogamous relationship 6 mos.  ; Smoking at up to 1 ppd since age 24 ; Uses EtOH in moderation. No illicit drugs.  Drinks lots of caffeine.; Has 3 grown children but one has had some trouble financially.    Review of Systems       per HPI.  denies BRBPR. + constipation (chronic).  no abdominal pain. + weight loss  Physical Exam  General:  Vital signs reviewed - noted 5lb wt loss in 1 month. Well-developed, well-nourished patient in NAD.  Awake, cooperative.  Abdomen:  Bowel sounds positive,abdomen soft and non-tender without masses, organomegaly or hernias noted. Psych:  still anxious appearing but not tearful at all. thoughts are goal directed.  normally interactive and good eye contact.    Impression & Recommendations:  Problem # 1:  DEPRESSIVE DISORDER, MAJOR, RCR, MILD (ICD-296.31) Assessment Improved  already seeing some improvement.  encouraged continued use of citalopram + therapy as i think therapy has been most helpful.  she agrees.    Orders: [facility]- Est Level  3 (16109)  Problem # 2:  WEIGHT LOSS, RECENT (ICD-783.21) Assessment: New  agree with her concern with weight loss, early satiety.  encouraged forcing foods in (perhaps some of this is behavorial and related to depression) but given history also very glad she already has appt with oncology.  she reports she is due to get a scan this day as well.  i have asked her to report these problems to her oncologist  Orders: [facility]- Est Level  3 ([address]454)  Complete Medication List: 1)  Hydrochlorothiazide 25 Mg Tabs (Hydrochlorothiazide) .Marland Kitchen.. 1 by mouth once daily for blood pressure 2)  Catapres 0.1 Mg Tabs (Clonidine hcl) .Marland Kitchen.. 1 tab by mouth two times a day.  do not miss doses 3)  Adult Aspirin Ec Low Strength 81 Mg Tbec (Aspirin) .Marland Kitchen.. 1 tab daily 4)  Nitroglycerin 0.4 Mg Subl (Nitroglycerin) .... Place one under tongue for chest  pain 5)  Toprol Xl 50 Mg Xr24h-tab (Metoprolol succinate) .Marland Kitchen.. 1 tab by mouth daily. 6)  Miralax Powd (Polyethylene glycol 3350) .... Use as directed 7)  Simvastatin 80 Mg Tabs (Simvastatin) .Marland Kitchen.. 1 by mouth at bedtime for cholesterol. 8)  Citalopram Hydrobromide 40 Mg Tabs (Citalopram hydrobromide) .Marland Kitchen.. 1 by mouth once daily for depression/anxiety  Patient Instructions: 1)  Please follow up in early June to make sure things are still going well for you.  Of course if things worsen then please call right away to be seen. 2)  Be sure to tell Dr Gaylyn Rong about your appetite loss and feeling full early and weight loss.  3)  Try to force at least 2 small meals daily. 4)  It was nice to see you today. Prescriptions: CITALOPRAM HYDROBROMIDE 40 MG TABS (CITALOPRAM HYDROBROMIDE) 1 by mouth once daily for depression/anxiety  #30 x 3   Entered and Authorized by:   Ancil Boozer  MD   Signed by:   Ancil Boozer  MD on 09/10/2009   Method used:   Electronically to        [facility] (retail)       [address]       [place], [place]  [address]       Ph: [phone]       Fax: [phone]   RxID:   [id number] PRAVASTATIN SODIUM 40 MG TABS (PRAVASTATIN SODIUM) 2 by mouth at bedtime for cholesterol  #60 x 3   Entered and Authorized by:   Ancil Boozer  MD   Signed by:   Ancil Boozer  MD on 09/10/2009   Method used:   Electronically to        [facility] (retail)       [address]       [place], [place]  [address]       Ph: [phone]       Fax: [phone]   RxID:   [id number]

## 2010-06-19 NOTE — Assessment & Plan Note (Signed)
Summary: numbness on back of leg to foor/[place]/alm   Vital Signs:  Patient profile:   60 year old female Height:      64 inches Weight:      157.1 pounds BMI:     27.06 Temp:     98.2 degrees F oral Pulse rate:   64 / minute BP sitting:   144 / 74  (left arm) Cuff size:   regular  Vitals Entered By: Garen Grams LPN (Sep 30, 2009 11:34 AM) CC: numbness in left leg and foot x 3 days Is Patient Diabetic? No Pain Assessment Patient in pain? no        Primary Care Provider:  Ancil Boozer  MD  CC:  numbness in left leg and foot x 3 days.  History of Present Illness: 60 yo female seen last week for acute back pain, prescribed Prednisone, Cyclobenzaprine, Tramadol.  At that time had back pain radiating down bilateral legs without saddle anesthesia, numbness in legs, or bowel/bladder incontinence.  Has been taking medicines as prescribed and back pain is gone.  Today presents with 3 days of numbness/tingling in the LEFT posterior thigh and plantar surface of foot.  No weakness, but is unsteady on feet secondary to numbness and is using a cane.  Denies bowel/bladder incontinence/retention, saddle anesthesia.  PMH negative for recent viral illness, osteoporosis, prior back problems.  Habits & Providers  Alcohol-Tobacco-Diet     Tobacco Status: current     Tobacco Counseling: to quit use of tobacco products  Current Problems (verified): 1)  Back Pain With Radiculopathy  (ICD-729.2) 2)  Back Pain  (ICD-724.5) 3)  Weight Loss, Recent  (ICD-783.21) 4)  Headache  (ICD-784.0) 5)  Postmenopausal Syndrome  (ICD-627.9) 6)  Fatigue  (ICD-780.79) 7)  Cough  (ICD-786.2) 8)  Pain in Joint Other Specified Sites  (ICD-719.48) 9)  COPD Unspecified  (ICD-496) 10)  Sexual Activity, High Risk  (ICD-V69.2) 11)  Constipation  (ICD-564.00) 12)  Personal Hx Colon Cancer  (ICD-V10.05) 13)  Cancer, Colorectal  (ICD-154.0) 14)  Tobacco Abuse  (ICD-305.1) 15)  Mitral Valve Prolapse, Hx of   (ICD-V12.50) 16)  Cad  (ICD-414.00) 17)  Depressive Disorder, Major, Rcr, Mild  (ICD-296.31) 18)  Hypercholesterolemia  (ICD-272.0) 19)  Gerd  (ICD-530.81) 20)  Cerebral Aneurysm  (ICD-437.3) 21)  Hypertension, Benign Essential  (ICD-401.1)  Current Medications (verified): 1)  Hydrochlorothiazide 25 Mg Tabs (Hydrochlorothiazide) .Marland Kitchen.. 1 By Mouth Once Daily For Blood Pressure 2)  Catapres 0.1 Mg  Tabs (Clonidine Hcl) .Marland Kitchen.. 1 Tab By Mouth Two Times A Day.  Do Not Miss Doses 3)  Adult Aspirin Ec Low Strength 81 Mg  Tbec (Aspirin) .Marland Kitchen.. 1 Tab Daily 4)  Nitroglycerin 0.4 Mg  Subl (Nitroglycerin) .... Place One Under Tongue For Chest Pain 5)  Toprol Xl 50 Mg Xr24h-Tab (Metoprolol Succinate) .Marland Kitchen.. 1 Tab By Mouth Daily. 6)  Miralax  Powd (Polyethylene Glycol 3350) .... Use As Directed 7)  Simvastatin 80 Mg Tabs (Simvastatin) .Marland Kitchen.. 1 By Mouth At Bedtime For Cholesterol. 8)  Citalopram Hydrobromide 40 Mg Tabs (Citalopram Hydrobromide) .Marland Kitchen.. 1 By Mouth Once Daily For Depression/anxiety 9)  Prednisone 20 Mg Tabs (Prednisone) .... 2 Tab By Mouth Once Daily For 7 Days For Back Pain 10)  Cyclobenzaprine Hcl 10 Mg Tabs (Cyclobenzaprine Hcl) .... 1/2 - 1 By Mouth Three Times A Day As Needed Muscle Spasms 11)  Tramadol Hcl 50 Mg Tabs (Tramadol Hcl) .Marland Kitchen.. 1 By Mouth Three Times A Day As Needed Severe Pain  Allergies (verified): 1)  ! Lisinopril  Review of Systems       Per HPI.  Physical Exam  General:  Well-developed,well-nourished,in no acute distress; alert,appropriate and cooperative throughout examination Msk:  No tenderness to palpation lower back.  LUMAR SPINE:  Full ROM without pain in forward flexion, extension, side flexion, rotation. Pulses:  2+ bilateral DP pulses. Extremities:  Decreased monofilament sensation LEFT foot. Normal monofilament sensation LEFT foot.  Neurologic:  2+ DTRs and 5/5 strength bilateral LE.  Antalgic gait using crutch.   Impression & Recommendations:  Problem # 1:   BACK PAIN WITH RADICULOPATHY (ICD-729.2) Assessment Deteriorated Concerning for worsening symptoms now with numbness in LEFT leg.  Continue steroids--increase dose to 40 mg by mouth daily--for 1 more week.  Check lumbar plain film today and MRI of lumbar back ASAP.  Follow up in 2 days. Orders: [facility]- Est Level  3 (29562) Diagnostic X-Ray/Fluoroscopy (Diagnostic X-Ray/Flu) MRI without Contrast (MRI w/o Contrast)  Complete Medication List: 1)  Hydrochlorothiazide 25 Mg Tabs (Hydrochlorothiazide) .Marland Kitchen.. 1 by mouth once daily for blood pressure 2)  Catapres 0.1 Mg Tabs (Clonidine hcl) .Marland Kitchen.. 1 tab by mouth two times a day.  do not miss doses 3)  Adult Aspirin Ec Low Strength 81 Mg Tbec (Aspirin) .Marland Kitchen.. 1 tab daily 4)  Nitroglycerin 0.4 Mg Subl (Nitroglycerin) .... Place one under tongue for chest pain 5)  Toprol Xl 50 Mg Xr24h-tab (Metoprolol succinate) .Marland Kitchen.. 1 tab by mouth daily. 6)  Miralax Powd (Polyethylene glycol 3350) .... Use as directed 7)  Simvastatin 80 Mg Tabs (Simvastatin) .Marland Kitchen.. 1 by mouth at bedtime for cholesterol. 8)  Citalopram Hydrobromide 40 Mg Tabs (Citalopram hydrobromide) .Marland Kitchen.. 1 by mouth once daily for depression/anxiety 9)  Prednisone 20 Mg Tabs (Prednisone) .... 2 tab by mouth once daily for 7 days for back pain 10)  Cyclobenzaprine Hcl 10 Mg Tabs (Cyclobenzaprine hcl) .... 1/2 - 1 by mouth three times a day as needed muscle spasms 11)  Tramadol Hcl 50 Mg Tabs (Tramadol hcl) .Marland Kitchen.. 1 by mouth three times a day as needed severe pain  Patient Instructions: 1)  Pleasure to meet you today. 2)  Please schedule a follow-up appointment in 2 days. 3)  Please get Xray of back today. 4)  We are scheduling an MRI of your back as well--we'll call with details. 5)  Double up on the Prednisone for the next week--I've sent a new prescription. Prescriptions: PREDNISONE 20 MG TABS (PREDNISONE) 2 tab by mouth once daily for 7 days for back pain  #14 x 0   Entered and Authorized by:   Romero Belling  MD   Signed by:   Romero Belling MD on 09/30/2009   Method used:   Electronically to        [facility] (retail)       [address]       [place], [place]  [address]       Ph: [phone]       Fax: [phone]   RxID:   [id number]   Appended Document: numbness on back of leg to foor/[place]/alm Correction:  decreased monofilament LEFT foot, normal RIGHT foot.

## 2010-06-23 NOTE — Op Note (Signed)
Lauren [name], [name] NO.:  [id number]  MEDICAL RECORD NO.:  [id number]          PATIENT TYPE:  INP  LOCATION:  2301                         FACILITY:  [facility]  PHYSICIAN:  [name], M.D.     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  PROCEDURES: 1. Flexible fiberoptic bronchoscopy. 2. Left thoracotomy with wedge resection of left upper lobe lung     cancer. 3. Completion left upper lobectomy. 4. Insertion of On-Q pain pump.  SURGEON:  [name], MD  ASSISTANT:  Doree Fudge, PA-C  ANESTHESIA:  General endotracheal.  CLINICAL HISTORY:  This patient is a 60 year old smoker with a history of colon cancer resection in 2008 with negative lymph nodes that required no further therapy.  She has been followed for this by Dr. Gaylyn Rong. She had a CT scan of the chest, abdomen, and pelvis done on October 2010 as part of her normal followup.  The lung wound was demonstrated.  There was a ground-glass attenuation nodule within the posterior right upper lobe measuring 1 cm that was similar to previous scan in January 2009. There was also a 4-mm ground-glass nodule in the posterior right upper lobe or superior segmental right lower lobe that was unchanged.  There was posterior left upper lobe ground-glass opacity with some solid part measuring about 1.4 x 0.7 cm that had been 1.5 x 0.8 cm on previous exam.  There is an increase in the solid component at that time that was felt to be due to technical differences.  There was also a left upper lobe interstitial opacity that was similar to previous CT scan in January 2009.  She has continued to smoke, although she did decrease the amount she was smoking to about a 1/2 a pack a day.  She  underwent a followup CT scan in October 2011, which showed that there was slight enlargement of the left upper lobe nodule posteriorly that appeared more nodular and solid in appearance.  The lesion had increased in size to 1.4 x 1.0 cm.  There were several other tiny left upper lobe lung nodules that did not change over the previous 2 years.  A new nodule on the right upper lobe measured 3 mm and was in the apical segment.  The ground glass opacity in the right upper lobe posteriorly had been stable over the previous 2 years.  There is also a new 3-mm pulmonary nodule noted in the right upper lobe medially.  There is a left thyroid lobe cystic lesion that was minimally enlarged compared to previous exam. Given this slow enlargement of the lesion in the posterior left upper lobe and the fact that it had appeared more solid and nodular, a CT- guided biopsy was performed, which showed adenocarcinoma.  She subsequently underwent a PET scan, which showed hypermetabolic uptake within this area of lung cancer in the posterior aspect of the left upper lobe with a SUV of 2.5  consistent with adenocarcinoma.  There are no other areas of hypermetabolic activity within the chest or mediastinum.  There was some mild hypermetabolic activity in the left adrenal gland, but that was felt to be most likely physiologic since there was no associated mass.  Preoperative pulmonary function testing was performed that showed an FEV-1 of 1.3, which was 63% predicted and FVC of 61% predicted.  Diffusion capacity was not performed.  She had been seen by Dr. Sherene Sires and no contraindication to surgery was found.  She was also seen preoperatively by Dr. Daleen Squibb for Cardiology clearance, and he felt that she was at low risk for cardiac problems and no further workup was indicated.  After review of all the data with her and her daughter, I felt the best option would be to proceed with flexible bronchoscopy and then left  thoracotomy for possible wedge resection of left upper lobe lung lesion.  I felt that her FEV-1 of 1.3 would be adequate to tolerate a lobectomy, although it may leave her somewhat limited from a pulmonary standpoint.  I was also concerned that she had other lung lesions present, which were negative on PET scan, but were all small and certainly could present problems in the future.  I discussed the recommended procedure of left upper lobe wedge resection followed by immediate frozen section and completion lobectomy if the surgical margin was not negative for cancer.  I discussed the pros and cons of this approach versus just performing a lobectomy or nonsurgical therapy with radiation.  We discussed the benefits and risks of surgery including not limited to bleeding, blood transfusion, infection, prolonged air leak from the lung, respiratory failure, organ dysfunction, and death.  She understood all of this and agreed to proceed with wedge resection followed by lobectomy if necessary for positive margin.  OPERATIVE PROCEDURE:  The patient was seen in the preoperative holding area and the proper patient, proper operation, proper operative side were confirmed with the patient after reviewing her studies and reviewing the chart.  The left side of her chest was signed by me.  She was taken back to the operating room, placed on table in supine position.  Preoperative intravenous antibiotics were given.  After induction of general endotracheal anesthesia using a single-lumen tube, a Foley catheter was placed in bladder using sterile technique.  Then flexible fiberoptic bronchoscopy was performed.  The distal trachea appeared normal.  The carina was sharp.  The right bronchial tree had normal segmental anatomy, and there were no endobronchial lesions. There is no signs of extrinsic compression.  The left bronchial tree had normal segmental anatomy.  There were no secretions and no  extrinsic compression.  There are no lesions within the airway.  The bronchoscope was then withdrawn from the patient.  The single-lumen tube was then changed for a double-lumen tube by Anesthesiology.  The patient was then turned into the right lateral decubitus position with the left side up. The left side of the chest was prepped with Betadine soap and solution, and draped in usual sterile manner.  Time-out was taken.  Proper patient, proper operation, and proper operative side were confirmed with nursing and Anesthesia staff.  Then the chest was opened through a lateral muscle sparing thoracotomy incision.  The pleural space was entered through the fifth intercostal space because the fourth intercostal space was lying up under the tip of the scapula making exposure difficult.  The chest retractor was replaced.  Examination of pleural space showed no fluid.  There was no evidence of any pleural lesions.  The left upper lobe was examined and the carcinoma was identified in the posterior aspect of the left upper lobe right on the surface of the lung.  There was slight umbilication of the visceral pleural surface.  I felt that it would be possible to obtain a grossly negative margin around the lung cancer using wedge resection with surgical staplers.  The palpable lesion appeared well confined.  The left lung was completely deflated.  Then using linear cutting staplers, wedge resection was performed around the lung cancer, taking as much margins I could through a normal-appearing lung.  This wedge resection specimen was then passed off the table and sent to pathology for frozen section.  Dr. Colonel Bald called the operating room and discussed frozen section with me, and he said that it appeared to be adenocarcinoma, and there was a positive margin.  Therefore, I felt that the safest thing to do to be proceed with completion lobectomy.  Then the mediastinal pleura over the left hilum was  incised.  The left main pulmonary artery was identified and followed out to the left upper lobe pulmonary artery branches.  The apical posterior pulmonary artery branch was identified first, and this had three sub-branches.  This was encircled with a tape and then divided using a linear vascular stapler. Dissection was continued along the left pulmonary artery and the anterior pulmonary artery to the upper lobe was identified.  This was also encircled with a tape and then divided using a linear vascular stapler.  There was a complete fissure between the upper and lower lobes.  The fissure was opened and the interlobar portion of the pulmonary artery was traced distally.  There were two lingular arteries, which were identified, encircled with a tape, and divided using a vascular stapler.  The remainder of the fissure was divided.  There were several lymph nodes present around the left upper lobe bronchus and these were taken with a specimen.  Then the left superior pulmonary vein was identified.  This had two major branches, both of which were encircled with tapes and then divided using a vascular stapler.  There was one small branch, which was suture ligated with a 2-0 silk suture ligature and divided.  This exposed the left upper lobe bronchus.  The lymph nodes were taken with the bronchus.  The bronchus was then encircled with a tape and a TL30 non-cutting linear stapler was placed around the bronchus.  The stapler was tightened to occlude the bronchus and then the left lung was reinflated.  The left lower lobe completely inflated without difficulty.  The stapler was then fired and the left upper lobe bronchus divided distal to the stapler.  The left upper lobe specimen was passed off table and sent to pathology.  The chest was filled with warm saline solution.  The bronchial stump tested at 30 cm of water pressure and there was no visible air leak.  There was complete hemostasis.   Then, the left inferior pulmonary ligament was divided using electrocautery.  There were a couple of lymph nodes within the ligament, which were sent to pathology as a separate specimen.  There were also two lymph nodes present in the periesophageal region, which were sent to pathology; and there was also lymph node in the region of the inferior pulmonary vein, which was sent to pathology as a separate specimen.  Then the On-Q pain pump was placed.  The introducer sheath and needle were  inserted through the skin into a sub muscular plane just inferior to the thoracotomy incision.  The catheter was inserted and the peel- away sheath removed.  Then, the sheath and trocar were used to tunnel the On-Q catheter in a subpleural location from inferior to the incision posterior to the thoracotomy incision and about two interspaces above the incision.  The catheter was then passed through the sheath and the sheath removed.  The catheter was lying in good position.  It was fixed to the skin with a silk suture and flushed with 5 mL of 0.5% Marcaine. Then a single 28-French chest tube was placed through separate stab incision and positioned posteriorly up to the apex.  Then #2 Vicryl pericostal sutures were placed.  The lung was reinflated and the pericostal suture was tied.  The muscles were returned to the normal anatomic position.  Subcutaneous tissue was closed with continuous 2-0 Vicryl and the skin with a 3-0 Vicryl subcuticular closure.  The sponge, needle, and instrument counts were correct according to the scrub nurse. Dry sterile dressing was applied over the incision and around the chest tube, which was hooked to Pleur-Evac suction.  The patient was then turned in the supine position, extubated, and transported to the post anesthesia care unit in satisfactory and stable condition.     [name], M.D.     BB/MEDQ  D:  06/16/2010  T:  06/17/2010  Job:  [id number]  cc:   Jethro Bolus,  MD  Electronically Signed by [name] M.D. on 06/23/2010 08:25:33 AM

## 2010-06-25 NOTE — Progress Notes (Addendum)
Summary: FYI  Phone Note Call from Patient Call back at Home Phone [phone]   Caller: Patient Summary of Call: is having lung surgery on 06/16/10 @ 7:30  she will be at [facility]  Initial call taken by: De Nurse,  June 11, 2010 9:30 AM  Follow-up for Phone Call        Called patient today (didn't see this note and couldn't remember when her surgery was).  Left a VM.  Will check with inpatient team to see how long she may be in house.  Follow-up by: Spero Geralds PsyD,  June 18, 2010 3:57 PM

## 2010-06-26 ENCOUNTER — Encounter (INDEPENDENT_AMBULATORY_CARE_PROVIDER_SITE_OTHER): Payer: Self-pay

## 2010-06-26 DIAGNOSIS — C3410 Malignant neoplasm of upper lobe, unspecified bronchus or lung: Secondary | ICD-10-CM

## 2010-07-03 NOTE — Progress Notes (Signed)
Summary: [facility]: Office Visit  [facility]: Office Visit   Imported By: Earl Many 06/24/2010 16:03:54  _____________________________________________________________________  External Attachment:    Type:   Image     Comment:   External Document

## 2010-07-07 ENCOUNTER — Other Ambulatory Visit: Payer: Self-pay | Admitting: Surgery

## 2010-07-07 DIAGNOSIS — D381 Neoplasm of uncertain behavior of trachea, bronchus and lung: Secondary | ICD-10-CM

## 2010-07-07 NOTE — Discharge Summary (Signed)
Lauren [name], Lauren [name]           ACCOUNT NO.:  [id number]  MEDICAL RECORD NO.:  [id number]           PATIENT TYPE:  I  LOCATION:  2036                         FACILITY:  [facility]  PHYSICIAN:  [name], M.D.     DATE OF BIRTH:  02-Apr-1951  DATE OF ADMISSION:  06/16/2010 DATE OF DISCHARGE:  06/20/2010                              DISCHARGE SUMMARY   ADMITTING DIAGNOSES: 1. Adenocarcinoma of the left upper lobe. 2. History of tobacco abuse. 3. History of emphysema. 4. History of colon cancer (status post resection in 2008).  DISCHARGE DIAGNOSES: 1. Adenocarcinoma of the left upper lobe. 2. History of tobacco abuse. 3. History of emphysema. 4. History of colon cancer (status post resection in 2008).  PROCEDURES:  Flexible fiberoptic bronchoscopy, left thoracotomy with wedge resection of the left upper lobe followed by completion left upper lobectomy, and insertion of On-Q pump by Dr. Laneta Simmers on June 16, 2010.PATHOLOGY RESULTS:  The left upper lung wedge biopsy was positive for adenocarcinoma with positive margins, therefore a left upper lobectomy was performed.  There was no evidence of carcinoma in 4/4 hilar lymph nodes.  HISTORY OF PRESENT ILLNESS:  This is a 60 year old African American female with a history of colon cancer (status post resection in 2008), emphysema, and a history of tobacco abuse who had a CT scan of the chest, abdomen, and pelvis done in October 2010.  There was ground-glass attenuation nodules in the posterior right upper lobe measuring 1 cm (this was similar to as previously seen in January 2009), a 4-mm ground- glass nodule in the posterior right upper lobe and superior segment of right lower lobe that was unchanged.  There was also a posterior left upper lobe ground-glass opacity with some part of solidity that measured 1.4 x 0.7 cm, that had previously been 1.5 x 0.8 cm.  The increase in the sulcal point at that time was felt to be due to slight  flexion differences.  There was also left upper lobe interstitial opacities that was similar to previous CT scan done in January 2009.  The patient continued with tobacco abuse, although she did decrease her use to half a pack of cigarettes per day.  A CT scan done October 2011 showed slight enlargement of the left upper lobe nodule which appeared more nodule and solid appearance; in addition, the lesion had increased in size to 1.4 x 1 cm.  There were also several other tiny left upper lobe nodules that did not change from previously.  There was also a new nodule in the right upper lobe that measured 3 mm as well as a ground-glass opacity in the right upper lobe posterior that has been stable.  There was also a left thyroid lobe cystic lesion that was minimally larger compared to previously.  A CT-guided needle biopsy was done on April 02, 2010. Results confirmed adenocarcinoma.  The patient was then seen in consultation by Dr. Laneta Simmers on April 22, 2010, regarding left upper lobe adenocarcinoma.  Pulmonary function test showed an FEV-1 around 1.3 (63% of predicted), FVC 61% predicted, a peak expiratory flow 77% of predicted, and a diffusion capacity was not  done.  These were also consistent with a mild airway obstruction with low vital capacity, possibly due to restriction of lung volumes.  The patient was then seen and evaluated by Dr. Daleen Squibb for cardiology clearance (the patient had a history of nonobstructive coronary artery disease in the past).  He felt she was at low cardiac risk and was cleared.  Dr. Laneta Simmers then had a discussion with the patient regarding necessitation for removal of the left upper lobe nodule either by wedge resection and lower lobectomy. Potential risks, benefits, and complications were discussed with the patient and she agreed to proceed.  She was admitted to [facility] on June 16, 2010, to undergo a flexible fiberoptic bronchoscopy, left thoracotomy with  wedge resection followed by a completion left upper lobectomy and insertion of On-Q pump by Dr. Laneta Simmers.  BRIEF HOSPITAL COURSE STAY:  The patient was extubated without difficulty.  She had a low-grade fever of 99.3, later became afebrile and her vital signs remained stable.  She had minimal chest tube output and initially there was no air leak.  She was found to have a small air leak with cough on postoperative day #1.  Her chest tubes were placed to water seal.  Daily chest x-rays were obtained and were stable.  The patient was felt surgically stable for transfer from Intensive Care into [facility] 2000 for further convalescence on June 16, 2010.  Her chest tube was removed on June 18, 2010.  Followup chest x-ray showed scattered airspace disease with left lung right basilar atelectasis, questionable small amount of pleural air at the base of the left hemithorax, however, no obvious pneumo.  Foley, PCA, and central line were also removed on this date.  The patient continued to improve such that currently on postop number #4 she had a T-max of 99.6 and became afebrile, heart rate 70s-80s, BP 157/87, and O2 sat 97% on room air.  Only complaint today includes constipation for which she has been given a laxative as well as hallucinations which are most likely related to her Chantix.  As a result, Chantix will be discontinued and nicotine patch has been requested.  She will begin this and continue this upon discharge.  She was instructed her primary care physician regarding further assistance on smoking cessation.  PHYSICAL EXAMINATION:  CARDIOVASCULAR:  Regular rate and rhythm. PULMONARY:  Slight increase at the left base.  Right lung clear. ABDOMEN:  Soft and nontender.  Bowel sounds present. EXTREMITIES:  No lower extremity edema. CHEST:  Her left chest wound is clean, dry, and continuing to heal.  Per discussion with Dr. Laneta Simmers, the patient is felt surgically stable for discharge  home.  Latest laboratory studies are as follows:  CMP done on June 18, 2010, potassium 4.1, sodium 137, BUN and creatinine of 5 and 0.76 respectively.  LFTs all within normal limits.  CBC on this date, H&H 1.6 and 36.7, white count 12,900, and platelet count of 162,000.  Last chest x-ray done on June 19, 2010, showed postoperative changes of the left chest wall (volume loss), tiny air- fluid level in the left lung apex compatible with hydropneumothorax, trace right pleural effusion as well as left.  DISCHARGE INSTRUCTIONS:  Diet:  Low-sodium heart-healthy.  Activity:  No driving.  No strenuous activity.  No lifting more than 10 pounds until instructed, otherwise.  The patient is to continue incentive spirometry and breathing exercises.  She is to walk daily and increase her frequency of duration as tolerates.  Followup appointments:  1. The patient has an appointment for suture removal on June 26, 2010, at 9:30 a.m. 2. The patient has an appointment to see Dr. Laneta Simmers on July 08, 2010, at 11:45 a.m. and 30 minutes prior to this office appointment     a chest x-ray will be obtained.  Smoking cessation was discussed at length as previous stated above. Chantix has been discontinued and the patient will continue on a nicotine patch and she is going to contact her medical doctor, she needs further assistance.  DISCHARGE MEDICATIONS: 1. Nicotine patch 14 mg every 24 hours 1 patch topically daily (the     patient is to remove old patch before applying new patch). 2. Percocet 5/325 one to two tablets every 4-6 hours as needed for     pain. 3. Enteric-coated aspirin 81 mg p.o. daily. 4. Bystolic 10 mg p.o. daily. 5. Citalopram 40 mg p.o. daily. 6. Clonidine 0.1 mg p.o. 2 times daily. 7. Ensure 237 mL p.o. daily. 8. Hydrochlorothiazide 25 mg 1 tablet p.o. daily. 9. Pravastatin 80 mg p.o. at bedtime.     Doree Fudge,  PA   ______________________________ [name], M.D.    DZ/MEDQ  D:  06/20/2010  T:  06/21/2010  Job:  [id number]  cc:   Thomas C. Daleen Squibb, MD, [facility] Jethro Bolus, MD  Electronically Signed by Doree Fudge PA on 06/23/2010 12:42:04 PM Electronically Signed by [name] M.D. on 07/07/2010 01:31:49 PM

## 2010-07-08 ENCOUNTER — Encounter (INDEPENDENT_AMBULATORY_CARE_PROVIDER_SITE_OTHER): Payer: Self-pay | Admitting: Surgery

## 2010-07-08 ENCOUNTER — Ambulatory Visit
Admission: RE | Admit: 2010-07-08 | Discharge: 2010-07-08 | Disposition: A | Discharge status: Home / Self Care | Payer: Medicare Other | Source: Ambulatory Visit | Attending: Surgery | Admitting: Surgery

## 2010-07-08 DIAGNOSIS — D381 Neoplasm of uncertain behavior of trachea, bronchus and lung: Secondary | ICD-10-CM

## 2010-07-08 DIAGNOSIS — C3410 Malignant neoplasm of upper lobe, unspecified bronchus or lung: Secondary | ICD-10-CM

## 2010-07-08 NOTE — Assessment & Plan Note (Signed)
OFFICE VISIT  [name], Lauren [name] DOB:  [birth year]                                        July 08, 2010 CHART #:  [id number]  The patient returned to my office today for followup, status post left thoracotomy with left upper lobe wedge resection and subsequent left upper lobectomy on 06/16/2010, for a T1a, N0 adenocarcinoma.  I initially performed wedge resection of the left upper lobe lung cancer due to significant COPD with an FEV-1 of 1.3.  Frozen section of the margin showed what was felt to be a cancer cells and therefore we proceeded with left upper lobectomy.  The final pathology showed atypical adenomatous hyperplasia present at the stapled resection margin, but no adenocarcinoma.  The patient had an uncomplicated postoperative course.  She has been doing well at home with mild chest wall discomfort.  Unfortunately, she has returned to smoking.  PHYSICAL EXAMINATION:  Blood pressure 104/70, pulse 78 and regular, respiratory rate is 16 and unlabored, oxygen saturation on room air is 99%.  She looks well.  Cardiac exam shows regular rate and rhythm with normal heart sounds.  Her lung exam is clear.  Left thoracotomy incision is healing well.  Followup chest x-ray today shows postoperative changes in the left hemithorax with some tenting of the left hemidiaphragm as expected. There are no pleural effusion and no lung lesions seen.  Her medication list was reviewed.  She is out of pain medicine and I wrote her prescription today for hydrocodone/APAP 7.5/500 one or two p.o. q.6 h. p.r.n. for pain #40.  I also advised her not to drive while she is taking this narcotic pain medicine.  I told her she could take ibuprofen during the day when she would like to drive and then use the hydrocodone at night.  IMPRESSION:  The patient is doing well early after left upper lobectomy. I told her she could return to driving a car as noted above.  I  asked her not to do any strenuous upper body exertion or heavy lifting for about 3 months postoperatively.  I strongly advised her to completely abstain from smoking and she is going to talk to her primary care provider about smoking cessation programs.  She will call Dr. Gaylyn Rong to set up a followup appointment.  I told her that he could perform her long- term followup of this lung cancer, since he is already going to be following her from a colon cancer point of view.  I told her that she does not need to return to see me unless there were any problems with her incision as long as Dr. Gaylyn Rong was going to do her long-term followup.  [name], M.D. Electronically Signed  BB/MEDQ  D:  07/08/2010  T:  07/08/2010  Job:  [id number]  cc:   Jethro Bolus, MD Armando Reichert, M.D.

## 2010-07-22 ENCOUNTER — Encounter: Payer: Self-pay | Admitting: Family Medicine

## 2010-07-22 ENCOUNTER — Ambulatory Visit (INDEPENDENT_AMBULATORY_CARE_PROVIDER_SITE_OTHER): Payer: Medicare Other | Admitting: Family Medicine

## 2010-07-22 DIAGNOSIS — I10 Essential (primary) hypertension: Secondary | ICD-10-CM

## 2010-07-22 DIAGNOSIS — F17200 Nicotine dependence, unspecified, uncomplicated: Secondary | ICD-10-CM

## 2010-07-22 DIAGNOSIS — E7800 Pure hypercholesterolemia, unspecified: Secondary | ICD-10-CM

## 2010-07-22 DIAGNOSIS — G8918 Other acute postprocedural pain: Secondary | ICD-10-CM

## 2010-07-22 DIAGNOSIS — F330 Major depressive disorder, recurrent, mild: Secondary | ICD-10-CM

## 2010-07-22 MED ORDER — NEBIVOLOL HCL 10 MG PO TABS: 10.0000 mg | ORAL_TABLET | Freq: Every day | ORAL | Status: DC

## 2010-07-22 MED ORDER — PRAVASTATIN SODIUM 80 MG PO TABS: 80.0000 mg | ORAL_TABLET | Freq: Every day | ORAL | Status: DC

## 2010-07-22 MED ORDER — CLONIDINE HCL 0.1 MG PO TABS: 0.1000 mg | ORAL_TABLET | Freq: Every day | ORAL | Status: DC

## 2010-07-22 MED ORDER — LIDOCAINE 5 % EX PTCH: 1.0000 | MEDICATED_PATCH | CUTANEOUS | Status: AC

## 2010-07-22 MED ORDER — HYDROCHLOROTHIAZIDE 25 MG PO TABS
25.0000 mg | ORAL_TABLET | Freq: Every day | ORAL | Status: DC
Start: 2010-07-22 — End: 2010-12-02

## 2010-07-22 MED ORDER — TRAMADOL HCL 50 MG PO TABS: 50.0000 mg | ORAL_TABLET | Freq: Four times a day (QID) | ORAL | Status: DC | PRN

## 2010-07-22 MED ORDER — CITALOPRAM HYDROBROMIDE 40 MG PO TABS
40.0000 mg | ORAL_TABLET | Freq: Every day | ORAL | Status: DC
Start: 2010-07-22 — End: 2010-12-31

## 2010-07-22 MED ORDER — BUPROPION HCL 100 MG PO TABS: 100.0000 mg | ORAL_TABLET | Freq: Three times a day (TID) | ORAL | Status: DC

## 2010-07-22 NOTE — Assessment & Plan Note (Signed)
Pt with elevated bp today, but out of bystolic.  Will refill all medications today.

## 2010-07-22 NOTE — Assessment & Plan Note (Signed)
-  Continue Pravachol ?

## 2010-07-22 NOTE — Patient Instructions (Signed)
It was nice to meet you.  I have started a medication called Wellbutrin to help you quit smoking.  It is very hard to quit but I know you can do this.  I have prescribed a lidocaine patch, which you can put directly on your skin where the scar hurts.  The other medication for pain is Tramadol, you can take it up to every 6 hours for pain.  Please ask your oncologist to send me a copy of your next visit with them.

## 2010-07-22 NOTE — Assessment & Plan Note (Signed)
Pt says she is still smoking about 1/2-3/4 ppd.  She wants to quit but has failed multiple times.  She was on Chantix, which gave her hallucinations, and the nicotine patch has not worked.  Will add wellbutrin to her medication regimen, for both tobacco cessation and depression.

## 2010-07-22 NOTE — Assessment & Plan Note (Signed)
Continue Celexa, will ask her to follow up with Dr. Pascal Lux for therapy.  Will add wellbutrin for smoking cessation and depression.

## 2010-07-25 ENCOUNTER — Other Ambulatory Visit: Payer: Self-pay | Admitting: Oncology

## 2010-07-25 ENCOUNTER — Encounter (HOSPITAL_BASED_OUTPATIENT_CLINIC_OR_DEPARTMENT_OTHER): Payer: Medicare Other | Admitting: Oncology

## 2010-07-25 DIAGNOSIS — I10 Essential (primary) hypertension: Secondary | ICD-10-CM

## 2010-07-25 DIAGNOSIS — C187 Malignant neoplasm of sigmoid colon: Secondary | ICD-10-CM

## 2010-07-25 DIAGNOSIS — J984 Other disorders of lung: Secondary | ICD-10-CM

## 2010-07-25 LAB — CBC WITH DIFFERENTIAL/PLATELET
BASO%: 0.4 % (ref 0.0–2.0)
EOS%: 1.8 % (ref 0.0–7.0)
MCH: 25.9 pg (ref 25.1–34.0)
MCHC: 32.8 g/dL (ref 31.5–36.0)
MCV: 78.8 fL — ABNORMAL LOW (ref 79.5–101.0)
MONO%: 7 % (ref 0.0–14.0)
RBC: 5.27 10*6/uL (ref 3.70–5.45)
RDW: 16.6 % — ABNORMAL HIGH (ref 11.2–14.5)
lymph#: 1.8 10*3/uL (ref 0.9–3.3)

## 2010-07-25 LAB — COMPREHENSIVE METABOLIC PANEL
ALT: 8 U/L (ref 0–35)
AST: 12 U/L (ref 0–37)
Albumin: 4.1 g/dL (ref 3.5–5.2)
Alkaline Phosphatase: 105 U/L (ref 39–117)
BUN: 12 mg/dL (ref 6–23)
Calcium: 9.1 mg/dL (ref 8.4–10.5)
Chloride: 102 mEq/L (ref 96–112)
Potassium: 3.3 mEq/L — ABNORMAL LOW (ref 3.5–5.3)

## 2010-07-26 ENCOUNTER — Encounter: Payer: Self-pay | Admitting: Family Medicine

## 2010-07-26 NOTE — Progress Notes (Signed)
Subjective:     Patient ID: Lauren [name], female   DOB: 18-Apr-1951, 60 y.o.   MRN: [id number]  HPI Pt presents for follow up of her chronic medical problems.  She is a long-time smoker and has a history of colon cancer.  She has been in remission but still under surveillance for recurrence.  She had imaging done to follow up her colon cancer, and was found to have a primary lung cancer.  In January she had a lung tumor respected.  She complains that she is still having pain from the surgery, she says her skin hurts and that it is sometimes painful to wear a bra or clothes.    Pt says she has been taking her HTN medications and cholesterol medications without any side effects.  She does report that she ran out of her bystolic and has not taken it in several days. .  She denies headaches, GI upset, shortness of breath, and exertional chest pain.    Lauren [name] is having a difficult time coping with her cancer diagnosis and the fact that she has still been unable to quit smoking.  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  She had been on chantix, but it made her hallucinate.  She has tried nicotine replacement, but is still unable to quit.     Review of Systems: Negative except stated in HPI.      Objective:   Physical Exam  Constitutional: No distress.  Eyes: Conjunctivae and EOM are normal. Pupils are equal, round, and reactive to light.  Cardiovascular: Normal rate and regular rhythm.   Pulmonary/Chest: Breath sounds normal. She is in respiratory distress. She exhibits tenderness.       Tenderness from surgical scar on left lateral chest, healing well, no erythema or drainage.   Abdominal: Soft. Bowel sounds are normal.  Psychiatric: Her speech is normal and behavior is normal. Judgment and thought content normal. Cognition and memory are normal. She exhibits a depressed mood.       Pt sad about her lung cancer diagnosis and frustrated with her inability to quit smoking.   Mood and affect are appropriate for situation.     Filed Vitals:   07/22/10 1403  BP: 159/83  Pulse: 74  Temp: 98 F (36.7 C)       Assessment:         Plan:

## 2010-07-29 ENCOUNTER — Other Ambulatory Visit: Payer: Self-pay | Admitting: Oncology

## 2010-07-29 DIAGNOSIS — C3490 Malignant neoplasm of unspecified part of unspecified bronchus or lung: Secondary | ICD-10-CM

## 2010-07-30 LAB — CBC
Hemoglobin: 14.8 g/dL (ref 12.0–15.0)
MCH: 25.7 pg — ABNORMAL LOW (ref 26.0–34.0)
MCHC: 32.8 g/dL (ref 30.0–36.0)
MCV: 78.3 fL (ref 78.0–100.0)
RBC: 5.76 MIL/uL — ABNORMAL HIGH (ref 3.87–5.11)

## 2010-07-30 LAB — PROTIME-INR: Prothrombin Time: 13.7 seconds (ref 11.6–15.2)

## 2010-08-04 LAB — COMPREHENSIVE METABOLIC PANEL
BUN: 9 mg/dL (ref 6–23)
CO2: 28 mEq/L (ref 19–32)
Calcium: 9.1 mg/dL (ref 8.4–10.5)
Creatinine, Ser: 0.67 mg/dL (ref 0.4–1.2)
GFR calc non Af Amer: >60 mL/min (ref >60)
Glucose, Bld: 107 mg/dL — ABNORMAL HIGH (ref 70–99)
Total Protein: 6.6 g/dL (ref 6.0–8.3)

## 2010-08-04 LAB — BASIC METABOLIC PANEL
CO2: 29 mEq/L (ref 19–32)
GFR calc Af Amer: >60 mL/min (ref >60)
Glucose, Bld: 82 mg/dL (ref 70–99)
Potassium: 3.7 mEq/L (ref 3.5–5.1)
Sodium: 139 mEq/L (ref 135–145)

## 2010-08-04 LAB — DIFFERENTIAL
Eosinophils Absolute: 0.2 10*3/uL (ref 0.0–0.7)
Lymphocytes Relative: 27 % (ref 12–46)
Lymphs Abs: 2.2 10*3/uL (ref 0.7–4.0)
Monocytes Relative: 7 % (ref 3–12)
Neutro Abs: 5.3 10*3/uL (ref 1.7–7.7)
Neutrophils Relative %: 64 % (ref 43–77)

## 2010-08-04 LAB — POCT CARDIAC MARKERS
CKMB, poc: <1 ng/mL — ABNORMAL LOW (ref 1.0–8.0)
Myoglobin, poc: 34.5 ng/mL (ref 12–200)
Troponin i, poc: <0.05 ng/mL (ref 0.00–0.09)

## 2010-08-04 LAB — URINALYSIS, ROUTINE W REFLEX MICROSCOPIC
Bilirubin Urine: NEGATIVE
Hgb urine dipstick: NEGATIVE
Nitrite: NEGATIVE
Specific Gravity, Urine: 1.02 (ref 1.005–1.030)
Urobilinogen, UA: 1 mg/dL (ref 0.0–1.0)
pH: 5.5 (ref 5.0–8.0)

## 2010-08-04 LAB — CBC
HCT: 44.1 % (ref 36.0–46.0)
Hemoglobin: 14.6 g/dL (ref 12.0–15.0)
Hemoglobin: 14.8 g/dL (ref 12.0–15.0)
MCHC: 33.4 g/dL (ref 30.0–36.0)
MCHC: 33.5 g/dL (ref 30.0–36.0)
MCV: 79.2 fL (ref 78.0–100.0)
RBC: 5.53 MIL/uL — ABNORMAL HIGH (ref 3.87–5.11)
RBC: 5.6 MIL/uL — ABNORMAL HIGH (ref 3.87–5.11)
RDW: 14.9 % (ref 11.5–15.5)
RDW: 17.2 % — ABNORMAL HIGH (ref 11.5–15.5)

## 2010-08-04 LAB — D-DIMER, QUANTITATIVE: D-Dimer, Quant: 0.29 ug/mL-FEU (ref 0.00–0.48)

## 2010-08-04 LAB — SURGICAL PCR SCREEN
MRSA, PCR: POSITIVE — AB
Staphylococcus aureus: POSITIVE — AB

## 2010-08-04 LAB — APTT: aPTT: 28 seconds (ref 24–37)

## 2010-08-12 ENCOUNTER — Telehealth: Payer: Self-pay | Admitting: Family Medicine

## 2010-08-12 ENCOUNTER — Telehealth: Payer: Self-pay | Admitting: Psychology

## 2010-08-12 DIAGNOSIS — G8918 Other acute postprocedural pain: Secondary | ICD-10-CM

## 2010-08-12 MED ORDER — TRAMADOL HCL 50 MG PO TABS: 50.0000 mg | ORAL_TABLET | Freq: Four times a day (QID) | ORAL | Status: DC | PRN

## 2010-08-12 NOTE — Telephone Encounter (Signed)
Pt need refill on her Tramadol.  Is completely out of medicine.  Please call refill to [facility] on [address]

## 2010-08-12 NOTE — Telephone Encounter (Signed)
Tashika calls to schedule an appt.  She is concerned about weight loss and her family thinks it may be psychological.  Reviewed Dr. Melina Modena note.  Scheduled for April 3rd at 2:00.

## 2010-08-19 ENCOUNTER — Ambulatory Visit (INDEPENDENT_AMBULATORY_CARE_PROVIDER_SITE_OTHER): Payer: Medicare Other | Admitting: Psychology

## 2010-08-19 DIAGNOSIS — F330 Major depressive disorder, recurrent, mild: Secondary | ICD-10-CM

## 2010-08-20 NOTE — Progress Notes (Signed)
Lauren [name] reports that she is having a tough time recovering emotionally and physically from her most recent cancer diagnosis.  She remembers having a similar issue after her colon cancer surgery.  Eventually she determined she was alive for a reason and she came to believe that reason was to give others hope and help. We discussed what is getting in the way of her having a similar outlook now.  She is concerned both about her smoking and eating habits.  She grows irritable when people "ride" her on these issues.  She either goes off or shuts down.

## 2010-08-20 NOTE — Assessment & Plan Note (Signed)
Report of mood is euthymic.  She appears less animated than in sessions past.  Thoughts are clear and goal directed.  Denied persistently depressed mood and anhedonia.  Does not have an appetite or if she does want something the urge is fleeting.  She does not think she is depressed.  She believes her body is having trouble catching up with her mind.  She does not have the "pep" she would like to have.  In terms of smoking, she is further along than prior to her surgery.  She wants to quit but lacks confidence and remains ambivalent.  In her words, she has been smoking 44 years.  This is more than a habit - it is a way of life.  She has not filled the Wellbutrin prescription for various reasons - one being her negative experience with Chantix.  Discussed this.    She thinks her smoking and her eating may be connected but isn't sure.  Lauren [name] is the epitome of autonomy.  Any hint of imposing one's will on her will bring up the defenses.  We worked with this in a number of ways today with some success I think.  Plan is for her to do some research on Wellbutrin and for [place] to talk again prior to her starting it.  I want to explore her ambivalence further before her starting the Wellbutrin.  Next appt:  4/19 at 2:00.

## 2010-08-27 ENCOUNTER — Encounter: Payer: Medicare Other | Admitting: Oncology

## 2010-09-04 ENCOUNTER — Ambulatory Visit (INDEPENDENT_AMBULATORY_CARE_PROVIDER_SITE_OTHER): Payer: Medicare Other | Admitting: Psychology

## 2010-09-04 DIAGNOSIS — F330 Major depressive disorder, recurrent, mild: Secondary | ICD-10-CM

## 2010-09-04 NOTE — Assessment & Plan Note (Signed)
Report of mood is good.  Affect is consistent.  Thoughts are clear and goal directed.  Mostly processing situation with her father.  No significant symptoms of depression noted or reported.  Smoking cessation is the biggest goal on my end.  Will continue to use MI approaches in this regard.  Follow-up May 7th at 11:00 or as needed.

## 2010-09-04 NOTE — Progress Notes (Signed)
Bentleigh reports that she is feeling well - despite the fact that her father died a week ago this 10-22-22.  He was 62 years old.  She is a little sad about his passing but mostly upset over how some of the family has handled the situation.  She attended the funeral with her daughter.  Dalene says her appetite has improved.  She is eating roughly two meals a day and feels good about this.  She has been cooking for a some-time house guest and she thinks that has helped.  However, his presence is a bit uncomfortable as it relates to another relationship and she is identifying some boundaries around this she plans to set.  She has noticed a decrease in her motivation to quit smoking since her father died.  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  Asked permission to bring it up regularly and she consented to that.

## 2010-09-11 ENCOUNTER — Telehealth: Payer: Self-pay | Admitting: Family Medicine

## 2010-09-11 DIAGNOSIS — G8918 Other acute postprocedural pain: Secondary | ICD-10-CM

## 2010-09-11 NOTE — Telephone Encounter (Signed)
Pt is wanting to have 1 more script for Tramadol called to [facility].  Her skin is still hurting and thinks she needs more.

## 2010-09-15 MED ORDER — TRAMADOL HCL 50 MG PO TABS: 50.0000 mg | ORAL_TABLET | Freq: Four times a day (QID) | ORAL | Status: DC | PRN

## 2010-09-15 NOTE — Telephone Encounter (Signed)
Medication refilled

## 2010-09-22 ENCOUNTER — Ambulatory Visit (INDEPENDENT_AMBULATORY_CARE_PROVIDER_SITE_OTHER): Payer: Medicare Other | Admitting: Psychology

## 2010-09-22 DIAGNOSIS — F17200 Nicotine dependence, unspecified, uncomplicated: Secondary | ICD-10-CM

## 2010-09-22 DIAGNOSIS — F330 Major depressive disorder, recurrent, mild: Secondary | ICD-10-CM

## 2010-09-22 NOTE — Assessment & Plan Note (Addendum)
Report of mood is good.  Affect is subdued for her.  She reports she is tired - wonders if she is getting good rest.  Says appetite is good.  She is experiencing pleasure.  Decision to have [facility] stay at her house seems considered even though it means being less than truthful with a man she is involved with long distance.  Discussed warning signs that this was no longer a healthy situation for her.  She decided she needs to have a talk with Kevin Fenton when she gets home today to clarify.  Elected to schedule for:  May 29th at 2:00.

## 2010-09-22 NOTE — Assessment & Plan Note (Signed)
Probably still contemplation stage but seems to have taken a step back from the more urgent mode she was in immediately post surgery.  If she continues to not want to talk about it, will need to address this ambivalence.

## 2010-09-22 NOTE — Progress Notes (Signed)
Lauren [name] reports that she is doing well.  Notes no trouble with mood.  Has not started the Wellbutrin.  When asked if we could spend time talking about that today she reports there is something else that she thinks I would like to hear.  The focus of the session was on a "housemate" that she has acquired and how this is impacting her self and her relationships.

## 2010-09-30 NOTE — Discharge Summary (Signed)
NAMESHADE, RIVENBARK NO.:  [id number]   MEDICAL RECORD NO.:  [id number]          PATIENT TYPE:  INP   LOCATION:  5707                         FACILITY:  [facility]   PHYSICIAN:  Cherylynn Ridges, M.D.    DATE OF BIRTH:  15-Dec-1950   DATE OF ADMISSION:  04/01/2007  DATE OF DISCHARGE:  04/10/2007                               DISCHARGE SUMMARY   DISCHARGE DIAGNOSES:  1. Adenocarcinoma of the sigmoid colon.  2. History of hypertension.  3. Coronary artery disease.  4. Hypercholesterolemia.  5. History of previous cerebral aneurysm, followed by Dr. Pearlean Brownie.   MEDICATIONS:  Medications on discharge included all her preoperative  medications,  including Diovan/HCT 160/25 mg 1 daily, Zegerid 40 mg  daily, Zantac 150 mg daily, metoprolol 50 mg twice a day and a baby  aspirin.  In addition to that, she was given Vicodin 1 to 2 tablets  every 4 hours as needed for pain.   She had a followup visit to see Dr. Lindie Spruce in 2 weeks.  She can shower  okay, walk up stairs.  She can drive in 1 week.  No lifting for 4 weeks.   BRIEF SUMMARY OF HOSPITAL COURSE:  The patient was admitted the day of  her surgery, at which time she underwent an extended left hemicolectomy  for a sigmoid colon mass.  This was a mass that on previous biopsy had  shown dysplastic features and a tubulovillous adenoma.  Subsequent  pathology demonstrated the patient had an adenocarcinoma, invasive,  moderately differentiated adenocarcinoma 6.5 cm in size in the  background of a tubulovillous adenoma with 0 out of 17 lymph nodes  positive for tumor, so there was no metastasis to lymph nodes.  She did  well postop day #1, had  postoperative ileus, was afebrile.  Postop day  #2 she had a fever up to 99.8.  She had some bowel sounds slightly  decreased.  Still she had a postop ileus, was allowed to eat.  Did not  increase her diet on postop day #2.  On postop day #3, her pathology  came back.  She was started on clear  liquids and IV fluids were  decreased.  Postop day #4 she had further increase in her diet to full  liquids.  She was ambulating and doing quite well.  On the evening of  postop day #4, she did not feel well during the night, had more  abdominal pain and distention, more diffuse tenderness.  Labs were  checked and they appeared to be OK.  Postop day #5 the difficulty was  found when her ileus appeared to have returned and she actually had an  NG tube placed which was doing okay; however, it came out on postop day  #5.  Postop day #6 we kept her NG tube out after it had sort of fallen  out.  Her abdomen  became more soft.  She had bowel sounds, had passed  some flatus but had not had a bowel movement.  On postop day #7 she was  mildly distended and was started back on clear liquids.  Postop day #8  we were able to advance her diet, and she was discharged to home on  postop day #9 feeling well, eating well.  Staples were removed, and she  was to follow up to see me in clinic, and that was in about 2 weeks.      Cherylynn Ridges, M.D.  Electronically Signed    JOW/MEDQ  D:  06/01/2007  T:  06/01/2007  Job:  [id number]

## 2010-09-30 NOTE — Assessment & Plan Note (Signed)
[facility]                            CARDIOLOGY OFFICE NOTE   NAME:Lauren [name], [place]                  MRN:          [id number]  DATE:02/01/2007                            DOB:          Dec 27, 1950    Lauren [name] comes in today for preop clearance for a colectomy for a  colonic mass which may be precancerous, per her review.   She has known coronary disease.  She has a history of hypertension.   Her last catheterization was July 11, 2004, at which time she had a  normal left main, 30-40% proximal LAD, 70% in a ramus that had  progressively gotten tighter, and subsequently 50% in the ramus, a 25%  left circumflex, and a nondominant very small right.  Her EF was 60%.  Unfortunately, she continues to smoke.   She saw Dr. Sherene Sires, who recommended her stop smoking for two weeks prior  to the surgery to reduce respiratory complications.  I am not sure if  she has done that.   MEDICATIONS:  1. Diovan/HCTZ 160/25.  2. Zegerid 40 mg nightly.  3. Bactrim 1 p.o. b.i.d.  4. Metoprolol 100 b.i.d.   She has been off aspirin for preparation for the surgery.   She does have some chest tightness when she gets stressed or upset.  She  has dyspnea on exertion.   PHYSICAL EXAMINATION:  Blood pressure 102/66, pulse 69 and regular.  Her  EKG is normal.  Weight is 143.  HEENT:  Normocephalic and atraumatic.  PERRLA.  Extraocular movements  are intact.  Sclerae are clear.  Facial symmetry is normal.  NECK:  Carotids upstrokes are equal bilaterally without bruits.  Thyroid  is not enlarged.  Trachea is midline.  Neck is supple.  LUNGS:  Decreased breath sounds with expiratory rhonchi.  HEART:  A nondisplaced PMI.  She has normal S1 and S2 without murmur.  ABDOMEN:  Soft with good bowel sounds.  EXTREMITIES:  No edema.  Pulses are intact.  NEUROLOGIC:  Intact.   [name] is having chest tightness with stress and shortness of  breath.  She had  progressive disease in her ramus by cath in February,  2006.  She has not had an objective assessment of her coronary disease  since then.  She still has ongoing risk factors, including smoking.   Prior to surgery, I would recommend an Adenosine rest stress Myoview.  If this shows no ischemia, we will clear her for surgery.  After her  surgery, she is to start back on the aspirin.     Thomas C. Daleen Squibb, MD, [facility]  Electronically Signed    TCW/MedQ  DD: 02/01/2007  DT: 02/01/2007  Job #: [id number]   cc:   Cherylynn Ridges, M.D.

## 2010-09-30 NOTE — Op Note (Signed)
Lauren [name], Lauren [name]           ACCOUNT NO.:  [id number]   MEDICAL RECORD NO.:  [id number]          PATIENT TYPE:  AMB   LOCATION:  ENDO                         FACILITY:  [facility]   PHYSICIAN:  Anselmo Rod, M.D.  DATE OF BIRTH:  01/15/1951   DATE OF PROCEDURE:  01/14/2007  DATE OF DISCHARGE:                               OPERATIVE REPORT   PROCEDURE PERFORMED:  Colonoscopy with multiple cold biopsies.   ENDOSCOPIST:  Anselmo Rod, M.D.   INSTRUMENT USED:  Pentax video colonoscope.   INDICATIONS FOR PROCEDURE:  60 year old African American female with a  history of rectal bleeding, family history of colon cancer, and abnormal  weight loss with change in bowel habits undergoing a screening  colonoscopy; rule out colonic polyps, masses, etc.   PREPROCEDURE PREPARATION:  Informed consent was procured from the  patient.  The patient fasted for 8 hours prior to the procedure and  prepped with a bottle of magnesium citrate and a gallon of NuLytely the  night prior to the procedure.  The risks and benefits of the procedure,  including a 10% missed rate of cancer and polyp, were discussed with the  patient as well.   PREPROCEDURE PHYSICAL:  VITAL SIGNS:  Stable.  NECK:  Supple.  CHEST:  Clear to auscultation.  S1 and S2 regular.  ABDOMEN:  Soft with normal bowel sounds.  Left lower quadrant tenderness  on palpation with guarding.  No rebound or rigidity.   DESCRIPTION OF PROCEDURE:  The patient was placed in the left lateral  decubitus position and sedated with an additional 25 mcg of Fentanyl and  4 mg of Versed given intravenously in slow incremental doses. Once the  patient was adequately sedated and maintained on low-flow oxygen and  continuous cardiac monitoring, the Pentax video colonoscope was advanced  from the rectum to the cecum with some difficulty. A pedunculated  sessile polyp was seen in the left colon just above a large lobulated  mass from 30-35 cm.  The polyps  were at about 40-45 cm. Multiple  biopsies were done of the lobulated mass that was almost near-  obstructing in nature. The transverse colon and right colon appeared  normal, and so did the cecum. The appendiceal orifice and ileocecal  valve were visualized and photographed. Multiple washes were done.  Retroflexion in the rectum revealed no abnormalities.  The patient  tolerated the procedure well without immediate complications.  The  polyps in the left colon were not removed, as the patient will have to  have a left colectomy in the near future because of the large mass  mentioned above.   IMPRESSION:  1. Large lobulated mass at 30-35 cm with 2 polyps in the left colon      (not removed).  2. Normal transverse colon, right colon, cecum, and terminal ileum.   RECOMMENDATIONS:  1. Await pathology results.  2. Check CEA levels.  3. Surgical evaluation has been requested by Dr. Jimmye Norman.  4. Further recommendation to be made by the surgical service.      Anselmo Rod, M.D.  Electronically Signed  JNM/MEDQ  D:  01/17/2007  T:  01/17/2007  Job:  [id number]   cc:   Cherylynn Ridges, M.D.  [address]  [place]  [place] [address]   Sibyl Parr. Darrick Penna, M.D.

## 2010-09-30 NOTE — Op Note (Signed)
Lauren [name], Lauren [name] NO.:  [id number]   MEDICAL RECORD NO.:  [id number]          PATIENT TYPE:  INP   LOCATION:  5707                         FACILITY:  [facility]   PHYSICIAN:  Cherylynn Ridges, M.D.    DATE OF BIRTH:  1951-04-26   DATE OF PROCEDURE:  DATE OF DISCHARGE:                               OPERATIVE REPORT   PREOPERATIVE DIAGNOSIS:  Dysplastic sigmoid polyp.   POSTOPERATIVE DIAGNOSIS:  Likely adenocarcinoma of the mid sigmoid  colon.   PROCEDURE:  Left colectomy with mobilization of the splenic flexure.   SURGEON:  Cherylynn Ridges, M.D.   ASSISTANT:  Dr. Janee Morn.   ANESTHESIA:  General endotracheal.   ESTIMATED BLOOD LOSS:  150 mL.   COMPLICATIONS:  None.   CONDITION:  Stable.   FINDINGS:  The patient had a constricted lesion of the mid sigmoid  colon, no palpable adenopathy, no obvious drop metaphysis and absent  uterus and no evidence of liver metastasis.   INDICATION FOR OPERATION:  The patient is a 60 year old who underwent a  colonoscopy, was found to have some dysplastic lesion on the sigmoid  colon who now comes in for an elective colectomy.   OPERATION:  The patient was taken to the operating room, placed on the  table in the supine position.   After an adequate endotracheal anesthetic was administered, she was  prepped and draped in the usual sterile manner exposing the midline of  the abdomen.  A midline incision was made from above the umbilicus down  to the pubic crest and taken down through the midline fascia using  electrocautery.  There were minimal to no adhesions in the peritoneal  cavity.  A Balfour retractor, Richardson retractor and a Balfour bladder  blade were used to get adequate mobilization.   Upon visually and manually inspecting the abdomen, a large tumor was  noted in the mid portion of the sigmoid colon.  We inspected the liver  which appeared to be grossly normal.  We palpated it and found no  nodules.  The  gallbladder was soft with no stones.  The spleen, liver  and the stomach all appeared to be normal.  She had markedly engorged  bilaterally gonadal vessels, especially the veins, possibly secondary to  previous surgery.   With the retractors in place, we mobilized the sigmoid colon and the  proximal rectum along the line of Toldt.  We mobilized it all the way up  to the spleen, the splenic flexure and brought that medially and  inferiorly.  We transected the colon approximately 10 cm above and below  the constricted lesion in the mid sigmoid colon.  We used the GIA  stapler to transect it there.  We took a V-shape portion of the  mesentery between the transected ends of colon using a LigaSure device,  obtaining adequate hemostasis.  Care was taken after mobilization to  make sure that the left ureter was pushed down towards the  retroperitoneum and not incidentally involved in the area of resection.   Once we took the mesentery and freed up the colon, we sent it off  the  field, marking the proximal end with the suture.  Once the mobilization  was complete, we did a two layered hand sewn anastomosis between the two  ends of sigmoid colon.  The outer layer was 3-0 silk Lembert stitches  and the inner layers were a running 3-0 Vicryl with a canal stitch.  All  counts were correct at this point.  Once the anastomosis was completed,  the surgeons changed their gloves, we irrigated with saline solution.  There was a small tear on the capsule of the spleen approximately 1 cm  in size which was cauterized and packed and had adequate control of  bleeding at the time of closure.  Surgicel was applied to that area.  We  closed the abdomen using a running looped #1 PDS suture and the skin  with stainless steel staples.  Needle counts, sponge counts and  instrument counts were correct.  A sterile dressing was applied.      Cherylynn Ridges, M.D.  Electronically Signed     JOW/MEDQ  D:   04/01/2007  T:  04/01/2007  Job:  [id number]

## 2010-09-30 NOTE — Op Note (Signed)
Lauren [name], Lauren [name]           ACCOUNT NO.:  [id number]   MEDICAL RECORD NO.:  [id number]          PATIENT TYPE:  AMB   LOCATION:  ENDO                         FACILITY:  [facility]   PHYSICIAN:  Anselmo Rod, M.D.  DATE OF BIRTH:  December 07, 1950   DATE OF PROCEDURE:  01/14/2007  DATE OF DISCHARGE:  01/14/2007                               OPERATIVE REPORT   PROCEDURE PERFORMED:  Esophagogastroduodenoscopy.   ENDOSCOPIST:  Anselmo Rod, M.D.   INSTRUMENT USED:  Pentax video panendoscope.   INDICATIONS FOR PROCEDURE:  This is a 60 year old African American  female with a history of abnormal weight loss and blood in stool.  Rule  out peptic ulcer disease, masses, polyps, etc.   PREPROCEDURE PREPARATION:  Informed consent was procured from the  patient.  The patient fasted for eight hours prior to procedure.  Risks  and benefits of the procedure were discussed with the patient in detail.   PREPROCEDURE PHYSICAL:  VITAL SIGNS:  The patient had stable vital  signs.  NECK:  Supple.  CHEST:  Clear to auscultation.  HEART:  S1 and S2 regular.  ABDOMEN:  Soft with normal bowel sounds.   DESCRIPTION OF PROCEDURE:  The patient was placed in left lateral  decubitus position and sedated with 75 mcg of fentanyl and 6 mg of  Versed given intravenously in slow incremental doses.  Once the patient  was adequately sedated and maintained on low-flow oxygen and continuous  cardiac monitoring, the Pentax video panendoscope was advanced through  the mouthpiece, over the tongue, into the esophagus under direct vision.  The entire esophagus was widely patent with no evidence of ring,  stricture, mass, esophagitis, or Barrett's mucosa.  The scope was then  advanced into the stomach.  Extrinsic compression of the antrum was  noted.  Reason for this is somewhat not clear to me at the present time.  No ulcers, erosions, masses, or polyps were identified.  The proximal  small bowel appeared normal.   There was no outlet obstruction.  The  patient tolerated the procedure well without immediate complications.  Retroflexion in the high cardia revealed no abnormalities.   IMPRESSION:  1. Normal-appearing esophagus.  2. Extrinsic compression of the antrum, reasons not clear.  3. Normal proximal small bowel.  4. No source of blood loss identified.  No ulcers, erosions, masses,      or polyps seen.   RECOMMENDATIONS:  Proceed with a colonoscopy at this time.  Further  recommendations to be made thereafter.      Anselmo Rod, M.D.  Electronically Signed     JNM/MEDQ  D:  01/16/2007  T:  01/17/2007  Job:  [id number]   cc:   Sibyl Parr. Darrick Penna, M.D.   Cherylynn Ridges, M.D.  [address]  [place]  [place] [address]

## 2010-09-30 NOTE — Assessment & Plan Note (Signed)
[facility]                            CARDIOLOGY OFFICE NOTE   NAME:Lauren [name], Lauren [name]                  MRN:          [id number]  DATE:12/15/2007                            DOB:          10/16/1950    Lauren [name] returns today for followup.  She has nonobstructive  coronary artery disease, but is now having a recurrent chest tightness  particularly with stress.  Her last stress Myoview was February 03, 2007, which cleared her for some surgery.  She had EF of 61% with breast  tissue attenuation, otherwise normal perfusion.  It was thought to be a  low-risk scan.  Her last catheterization demonstrated a 30-40% proximal  LAD, 70% in the ramus, and 25% left circumflex, nondominant very small  right.  EF was normal.   MEDICATIONS:  She did not take her meds this morning.  She is currently  on clonidine and cannot give [place] the dose b.i.d., hydrochlorothiazide 25  mg a day, aspirin 81 mg a day, and metoprolol 100 b.i.d..   PHYSICAL EXAMINATION:  VITAL SIGNS:  Her blood pressure is 178/85 and  pulse 66 and regular.  VITAL SIGNS:  Her weight is 164.  HEENT:  Unchanged.  NECK:  Carotids are full without bruits.  There is no thyromegaly.  Trachea is midline.  LUNGS:  Clear.  HEART:  Regular rate and rhythm.  No gallop.  ABDOMEN:  Soft, good bowel sounds.  EXTREMITIES:  No cyanosis, clubbing, or edema.  Pulses are present.  NEURO:  Intact.   Her EKG is normal, except for some nonspecific changes.   Lauren [name] symptoms are worrisome even though just with stress.  She has been known to have progressive disease and has ongoing risk  factors.  We will obtain objective assessment of her coronaries with an  adenosine Myoview.  If this stable, I will see her back again in a year.     Thomas C. Daleen Squibb, MD, [facility]  Electronically Signed    TCW/MedQ  DD: 12/15/2007  DT: 12/15/2007  Job #: [id number]   cc:   [facility]

## 2010-09-30 NOTE — Consult Note (Signed)
NEW PATIENT CONSULTATION   NADRA, HRITZ  DOB:  08/20/1950                                        April 23, 2010  CHART #:  [id number]   REASON FOR CONSULTATION:  Left upper lobe lung nodule.   CLINICAL HISTORY:  The patient is a 60 year old smoker with a history of  colon cancer resection in 2008, with negative lymph nodes that required  no further therapy.  She had a CT scan of the chest, abdomen, and pelvis  done in October 2010.  The lung windows demonstrated a ground-glass  attenuation nodule within the posterior right upper lobe measuring 1 cm  that was similar to previous scan in January 2009.  There was a 4-mm  ground-glass nodule in the posterior right upper lobe or superior  segment of the right lower lobe that was unchanged.  There is a  posterior left upper lobe ground glass opacity with some solid part  measuring 1.4 x 0.7 cm that had been 1.5 x 0.8 cm on prior exam.  The  increase in the solid component at that time was felt to be due to  slight flexion differences.  There is also a left upper lobe  interstitial opacity that was similar to previous CT in January 2009.  The patient has continued to smoke, although she has decreased the  amount that she is smoking to about 1/2 pack of cigarettes per day.  She  underwent a followup CT scan in October 2011, which showed that there  was slight enlargement of the left upper lobe nodule which appeared more  nodular and solid in appearance.  The lesion had increased in size to  1.4 x 1.0 cm.  There are several other tiny left upper lobe nodules that  did not change over the previous 2 years.  A new nodule in the right  upper lobe measured 3 mm and was in the apical segment.  The ground-  glass opacity in the right upper lobe posteriorly have been stable over  the previous 2 years.  There is also a new 3-mm pulmonary nodule noted  in the right upper lobe medially.  There is a left thyroid lobe  cystic  lesion that was minimally larger compared to the previous exam.  Given  the slow enlargement of the lesion in the posterior left upper lobe, a  CT-guided needle biopsy was performed and this showed adenocarcinoma.   REVIEW OF SYSTEMS:  Her review of systems is as follows:  GENERAL:  She does report some weight loss over the past year attributes  this to poor appetite.  She denies any significant fatigue.  EYES:  Negative.  ENT:  Negative.  ENDOCRINE:  She denies diabetes and hypothyroidism.  CARDIOVASCULAR:  She does report chest tightness and pressure and a  feeling of something laying on her chest with exertion.  She has had  exertional dyspnea such as walking briskly or going upstairs on an  incline.  She denies PND and orthopnea.  She has had no peripheral edema  or palpitations.  RESPIRATORY:  She does report productive cough, but denies hemoptysis.  She occasionally has wheezing.  GI:  She has had no nausea or vomiting.  She denies melena and bright  red blood per rectum.  She has a history of constipation.  GU:  She  denies dysuria and hematuria.  MUSCULOSKELETAL:  She denies arthralgias and myalgias.  NEUROLOGICAL:  She has had headaches.  She has a history of brain  aneurysm, treated nonsurgically and had mild numbness in her fingers and  toes on the right side related to that.  This has been followed by Dr.  Pearlean Brownie.  PSYCHIATRIC:  She does have a history of depression and anxiety.  HEMATOLOGICAL:  She denies any history of bleeding disorders or easy  bleeding.   ALLERGIES:  None.   MEDICATIONS:  1. Aspirin 81 mg daily.  2. Citalopram 40 mg daily.  3. Pravastatin 40 mg daily.  4. Dexlansoprazole 60 mg daily.  5. Clonidine 0.1 mg daily.  6. HCTZ 25 mg daily.   PAST MEDICAL HISTORY:  Significant for hypertension and hyperlipidemia.  She has a history of nonobstructive coronary disease status post  multiple admissions for chest pain and cardiac catheterization in  2001  and 2006.  She is followed by Dr. Daleen Squibb of [facility].  She has a  history of colon cancer status post resection in November 2008.  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  She had no lymph nodes positive and no vascular or  lymphatic invasion.  She has a history of depression.  She has a history  of a brain aneurysm that has been treated nonsurgically and followed by  Dr. Pearlean Brownie.   SOCIAL HISTORY:  She is widowed and has 3 adult children.  She is here  with her daughter today.  She is retired.  She still smokes about 1/2  pack of cigarettes per day, but has smoked much more in the past.  She  denies alcohol use.   FAMILY HISTORY:  Positive for cancer.  Her mother died of lymphoma and  she has had other family members died of lung cancer and pancreatic  cancer.   PHYSICAL EXAMINATION:  Vital Signs:  Her blood pressure is 136/76, pulse  of 60 and regular, and respiratory rate is 16, unlabored.  Oxygen  saturation on room air is 96%.  General:  She is a well-developed  [place] female, in no distress.  HEENT:  Normocephalic and  atraumatic.  Pupils are equal and reactive to light.  Sclerae are clear.  Oropharynx is clear.  Neck:  Normal carotid pulses bilaterally.  There  are no bruits.  There is no cervical or supraclavicular adenopathy.  There is no axillary adenopathy.  Lungs:  Clear.  Cardiac:  Regular rate  and rhythm with normal S1 and S2.  There is no murmur, rub, or gallop.  Abdomen:  Active bowel sounds.  Abdomen is soft and nontender.  There  are no palpable masses or organomegaly.  Extremities:  No peripheral  edema.  Pedal pulses are palpable bilaterally.  Skin:  Warm and dry.  Neurologic:  Alert and oriented x3.  Motor and sensory exams grossly  normal.   IMPRESSION:  The patient has a 1.4 x 1.0 cm biopsy-proven adenocarcinoma  in the posterior aspect of the left upper lobe that has been slowly   enlarging on serial CT scans.  She has multiple other small pulmonary  nodules bilaterally, some of which have been unchanged and a few of  which are new.  I suspect this is a primary lung cancer since CT scan of  the abdomen and pelvis has shown no other evidence of metastatic colon  cancer.  She said she has  been seen by Dr. Sandrea Hughs recently for  pulmonary evaluation and had pulmonary function testing done and I will  get those results from his office.  We will do a PET scan to assess for  any other evidence of malignant disease.  Approximately, 15-25% of  patients will have occult malignant disease diagnosed by PET scan.  We  will also have her see Dr. Daleen Squibb for preoperative cardiac clearance given  her history of nonobstructive coronary disease by catheterization in  2006, and her multiple cardiac risk factors.  She did have progression  of her coronary disease from 2001 to 2006, and has symptoms that could  be attributed to significant coronary disease.  If her pulmonary  function tests are acceptable and she is cleared from a cardiology  standpoint, then I would plan surgical resection.  This lesion would be  amenable to wedge resection.  I will make a decision about whether to  perform wedge resection or lobectomy based on her other preoperative  evaluation.  I will plan to see her back in the office after the above  workup has been completed, so that we can discuss further plans for  treatment with her and her daughter.   [name], M.D.  Electronically Signed   BB/MEDQ  D:  04/23/2010  T:  04/23/2010  Job:  [id number]   cc:   Jethro Bolus, MD  Jesse Sans. Daleen Squibb, MD, [facility] B. Sherene Sires, MD, FCCP

## 2010-09-30 NOTE — Assessment & Plan Note (Signed)
 HEALTHCARE                             PULMONARY OFFICE NOTE   NAME:Lauren [name], Degrasse                  MRN:          [id number]  DATE:01/26/2007                            DOB:          08/04/1950    This is a pulmonary extended preoperative evaluation.   HISTORY:  A 60 year old black female here for surgical clearance.  I had  previously seen her in Dr. Georgann Housekeeper absence on Sep 16, 2006, with a  history of hypertension and ischemic heart disease.  I felt she probably  had ACE inhibitor intolerance based on her vague symptoms and was not  sure if she actually even had COPD.  She was switched over to ARB  therapy and returns today with no limiting dyspnea or significant cough,  chest pain, fevers, chills, sweats, orthopnea, PND or leg swelling.  She  is contemplating herniorrhaphy surgery.  She does have a sensation of  chest congestion but actually no excess sputum production including in  the early morning hours and denies any nocturnal awakening and is able  to lie flat comfortably.   PHYSICAL EXAMINATION:  VITAL SIGNS:  She is afebrile with stable vital  signs.  GENERAL APPEARANCE:  She is a pleasant, ambulatory black female in no  acute distress.  HEENT:  Unremarkable.  Oropharynx is clear.  LUNGS:  Lung fields do reveal slight pseudowheeze.  CARDIOVASCULAR:  There is a regular rate and rhythm without murmurs,  rubs, or gallops.  ABDOMEN:  Soft, benign.  EXTREMITIES:  Warm without calf tenderness, clubbing, cyanosis, or  edema.   Her PFTs were reviewed from September 03, 2005, indicating an FEV1 of 1.48  but normal ratio and minimal inspiratory truncation.   IMPRESSION:  This patient does not have any evidence of significant  chronic obstructive pulmonary disease.  She does need to quit smoking  for a full two weeks preoperatively optimally and because she has mild  pseudowheeze on exam with a history of ACE inhibitor intolerance, she  may well have extraesophageal reflux/ LPR and recommended she not only  follow a reflux diet but takes Zegerid 40 mg nightly.   I did review a reflux diet with her including the avoidance of menthol  lozenges as well as all smoking.   For cough p.r.n. if it becomes a problem, she can use Mucinex DM one to  two q.12h.   Pulmonary follow-up can be on a p.r.n. basis and she is cleared for  surgery at this point.   ADDENDUM:  I did warn the patient that failure to stop smoking for two  weeks preoperatively will increase her risk of postoperative  complications including, but not limited to, retained secretions,  atelectasis and pneumonia as well as respiratory failure.     Charlaine Dalton. Sherene Sires, MD, [facility]  Electronically Signed    MBW/MedQ  DD: 01/26/2007  DT: 01/27/2007  Job #: [id number]   cc:   Cherylynn Ridges, M.D.

## 2010-09-30 NOTE — Assessment & Plan Note (Signed)
[facility]                            CARDIOLOGY OFFICE NOTE   NAME:Lauren [name], Lauren [name]                  MRN:          [id number]  DATE:04/28/2007                            DOB:          1951/04/14    Ms. Nuon returns today for followup of her coronary disease.  I  saw her February 01, 2007 for surgical clearance for a partial  colectomy for colon cancer.   Her stress Myoview at that time showed an EF of 61% with mild thinning  but no ischemia.   She went through surgery the first week of November.  She has an  appointment with Dr. Myna Hidalgo for probable chemotherapy. She says it was  not spread however.   She has some occasional chest tightness when she is stressed.  She wants  to get this over as soon as possible.   CURRENT MEDICATIONS:  Metoprolol 100 mg p.o. b.i.d., Diovan HCT 160/25  daily, aspirin 81 mg daily.   EXAMINATION:  VITAL SIGNS:  Her blood pressure today is 134/86, heart  rate is 76 and regular. She has a normal EKG.  HEENT:  Normocephalic, atraumatic. PERLA. EOMI. Sclerae clear. Facial  symmetry is normal.  NECK:  Carotids were full without bruits.  Thyroid is not enlarged.  Trachea is midline.  LUNGS:  Clear.  HEART:  Reveals a nondisplaced PMI. Normal S1, S2, no gallop.  ABDOMEN:  Exam is benign except for her previous surgery.  She does not  have a colostomy.  EXTREMITIES:  Reveal no edema, pulses are present.  NEUROLOGICAL EXAM:  Intact.   ASSESSMENT:  Ms. Hassinger is doing well with her coronary disease.  She has been asked to continue her current medications. Her blood  pressure is under reasonable control.  I have asked her to carry  sublingual nitroglycerin which we prescribed for her.  She knows how to  use it and how to activate 911 if necessary.  I will see her back in 6  months.     Thomas C. Daleen Squibb, MD, [facility]  Electronically Signed    TCW/MedQ  DD: 04/28/2007  DT: 04/28/2007  Job #: [id number]   cc:    Rose Phi. Myna Hidalgo, M.D.  Alanson Puls, M.D.

## 2010-10-03 NOTE — Cardiovascular Report (Signed)
NAME:  Lauren [name], Lauren [name] NO.:  [id number]   MEDICAL RECORD NO.:  [id number]          PATIENT TYPE:  INP   LOCATION:  3740                         FACILITY:  [facility]   PHYSICIAN:  Arvilla Meres, M.D. LHCDATE OF BIRTH:  April 01, 1951   DATE OF PROCEDURE:  07/11/2004  DATE OF DISCHARGE:                              CARDIAC CATHETERIZATION   PATIENT IDENTIFYING INFORMATION:  Lauren [name] is a 60 year old woman with  multiple cardiac risk factors admitted with chest pain and syncope. She was  referred for cardiac catheterization to definitely define her coronary  anatomy.   PROCEDURE:  1.  Selective coronary angiography.  2.  Left heart catheterization.  3.  Left ventriculogram.   DESCRIPTION OF PROCEDURE:  The risks and benefits of the procedure were  explained to Ms. Lauren [name]. Consent was signed and placed on the chart. The  right groin area was prepped and draped in routine sterile fashion. The area  was subsequently anesthetized with 1% local Lidocaine. A 6 French arterial  sheath was placed in the right femoral artery using a modified Seldinger  technique. Standard catheters were used throughout the procedure and all  catheter exchanges were made over a wire. Catheter's used included a 6  [place] JR4, JL 4 and bent pigtail. There were no apparent complications. At  the end of the procedure, the patient was transported to the holding area  for removal of her arterial access.   FINDINGS:  Central aortic pressure is 169/85 with a mean of 117.  Left ventricular pressure is 159/12 with an left ventricular EDP of 17.   CORONARY ANATOMY:  1.  Left main:  Was normal.  2.  Left anterior descending:  Was a long vessel that wrapped the apex. Gave      off 2 small diagonals. In the left anterior descending proper, there was      a long 30% to 40% lesion proximally with diffuse mild disease from the      mid vessel to the distal vessel. This was not flow limiting.  3.   The left circumflex:  Was a large dominant vessel. It gave off a long      ramus of moderate caliber. There was also several small OM's and a      normal size PDA.  4.  The ramus:  Had a 70% proximal lesion with a 50% mid lesion and diffuse      distal disease and about a 2.0 mm vessel.  5.  The left circumflex proper:  There was a 25% lesion proximally and      multiple 30% focal lesions in the mid and distal vessel.  6.  The right coronary artery:  Was a non-dominant vessel that was tiny.   LEFT VENTRICULOGRAM:  Done in the RAO approach showed an ejection fraction  of greater than or equal to 60%. There are no regional wall motion  abnormalities.   ASSESSMENT/PLAN:  1.  Non-obstructive coronary artery disease with borderline lesion in the      ramus, which is a fairly small caliber vessel.  2.  Normal left ventricular function.  3.  Recommend proceeding with medical management and she will need      aggressive risk factor control, as she does have evidence of progression      of her coronary disease since her previous catheterization 5 years ago.      DB/MEDQ  D:  07/11/2004  T:  07/13/2004  Job:  [id number]   cc:   Thomas C. Wall, M.D.

## 2010-10-03 NOTE — Cardiovascular Report (Signed)
[place]  Patient:    Lauren [name], Lauren [name]                  MRN: [id number] Proc. Date: 04/26/00 Adm. Date:  [id number] Attending:  Loreli Dollar CC:         [facility]  Cath Lab   Cardiac Catheterization  ADDENDUM  PROCEDURE: 1. Left heart catheterization. 2. Selective right and left coronary arteriography. 3. Ventriculography in the RAO projection.  COMPLICATIONS:  None.  EQUIPMENT:  6 French Judkins configuration catheters.  DESCRIPTION OF PROCEDURE:  The patient was prepped and draped in the usual sterile fashion exposing the right groin.  Following local anesthetic with 1% Xylocaine, the Seldinger technique was employed and a 6 [place] introducer sheath was placed into the right femoral artery.  Selective right and left coronary arteriography and ventriculography in the RAO projection using 25 cc of contrast at 12 cc per second was performed.  The patient was given 1 mg of IV Versed during the procedure. DD:  04/26/00 TD:  04/26/00 Job: [id number]

## 2010-10-03 NOTE — Discharge Summary (Signed)
NAMEMarland [name]  RILEY, PAPIN NO.:  [id number]   MEDICAL RECORD NO.:  [id number]          PATIENT TYPE:  INP   LOCATION:  3740                         FACILITY:  [facility]   PHYSICIAN:  Jonelle Sidle, M.D. LHCDATE OF BIRTH:  [birth year]   DATE OF ADMISSION:  07/10/2004  DATE OF DISCHARGE:  07/12/2004                                 DISCHARGE SUMMARY   Left against medical advice 1540 hours July 12, 2004.   DISCHARGE DIAGNOSES:  1.  Admitted with left-sided chest pressure, a squeezing 6/10 with      clamminess, dizziness, and loss of consciousness.  No seizure activity.      Felt to be a vasovagal episode.  2.  Ruled out were myocardial infarction based on troponin I studies.  3.  Electrocardiogram showing sinus rhythm in February 24.  No acute ST      elevations or diagnostic ST changes.  4.  Admission potassium was 2.8 and patient on hydrochlorothiazide without      potassium supplement.  5.  Left heart catheterization February 24.  Ejection fraction equal to or      greater than 60% with no wall motion abnormalities.  Ramus intermedius      had a 70% proximal stenosis which is a borderline lesion.  The ramus is      a fairly small caliber vessel and this finding was reviewed with Dr.      Juanda Chance.  The decision was made to proceed with medical management.      Patient does, however, need aggressive risk factor control as coronary      artery disease seems to be slightly increased since her catheterization      in 2001.  Leaving against medical advice certainly mitigates against      effective care of risk factors.  Because of history of syncope even      though vasovagal orthostatic blood pressures were ordered, but did not      appear to be done this hospitalization.   SECONDARY DIAGNOSES:  1.  Hypertension.  2.  Dyslipidemia.  Total cholesterol 206, LDL cholesterol 145.  3.  Ongoing tobacco habituation.  4.  Adenosine Cardiolite study in March 2005, normal.  5.  Catheterization December 2001.  Diagonal had a 40-50% stenosis, normal      left ventricular function.  Catheterization in 1999 was negative.  6.  Patient also status post tubal ligation.  7.  Has history of syncope.   PROCEDURE:  July 11, 2004:  Left heart catheterization, Dr. Arvilla Meres.  Left main is without significant disease.  Left anterior  descending is long with a 30-40% proximal stenosis, diffuse mild disease at  the mid point.  Left circumflex is dominant with a long ramus, small obtuse  marginals.  The ramus has a 70% proximal stenosis, 50% mid point stenosis  with diffuse distal disease.  The left circumflex itself has a 25% proximal  stenosis and multiple 30% stenoses.  The right coronary artery is  nondominant and small.  Left ventricular ejection fraction less than or  equal to 60% with no wall motion  abnormalities.  Impression is of non-  obstructive coronary artery disease with borderline lesion in the ramus  which is a fairly small caliber vessel.  Normal left ventricular function.  Proceed with medical management.  Plan in this patient medical management  would consist of beta blocker therapy which was instituted but patient did  not stay long enough to carry the prescription for Lopressor 12.5 mg b.i.d.  home with her.  She also needs potassium supplementation if she is to  continue taking hydrochlorothiazide.  Also, she would benefit from starting  Statin.  Certainly, with her LDL cholesterol with the level that it is, at  145.  Our plan on discharge was Zocor 40 mg daily, enteric-coated aspirin 81  mg daily, potassium chloride 20 mEq daily, hydrochlorothiazide 25 mg daily,  and labetalol 200 mg b.i.d.  She was also to follow up with Dr. Juanito Doom.   BRIEF HISTORY:  Lauren [name] is a 60 year old female with a history  of non-obstructive coronary artery disease by prior catheterizations.  She  had catheterizations in 1999 and 2001.  She had a  negative Adenosine  Cardiolite study in March 2005.  Her cardiac risk factors are notable for  long-standing tobacco.  She has dyslipidemia, hypertension, and age.  She  presents now with left-sided chest pressure which she describes as a  squeezing.  It is 6/10 in intensity.  She also notes concomitant clamminess  followed by dizziness.  She actually had a witnessed loss of consciousness.  There was no seizure activity with this, no incontinence.  Pain lasted for  about two hours.  Patient was basically pain-free on arrival.  Reports  exertional chest pain and shortness of breath when she climbs stairs.  Today's chest pain was similar to pain prior to previous catheterizations.  Plan admission.  Rule out acute myocardial infarction with serial cardiac  enzymes and favor left heart catheterization.   HOSPITAL COURSE:  Patient admitted with chest pain.  Was placed on IV  heparin.  Started on Lopressor 12.5 mg b.i.d.  Her potassium which was  extremely low at 2.8 most probably secondary to hydrochlorothiazide wastage  was replenished.  Patient was scheduled for left heart catheterization.  The  results have been dictated above.  Essentially, she has non-obstructive  coronary artery disease with some progress since the last catheterization.  She was maintained on medical management and more strict attention to risk  factors.  To this extent, the patient was placed on a Statin, continued on  aspirin, potassium as replenishing, hydrochlorothiazide continued, and  labetalol initiated.  Syncope most likely vasovagal.  Dr. Pearlean Brownie has been  attending her in the past and agrees this is most likely a vasovagal event.  The patient is welcome to follow up with Dr. Pearlean Brownie as outpatient.  We will  schedule her to see Dr. Daleen Squibb in the next two weeks.  Our office will call  her with an appointment.  At that time we expect potassium chloride to be initiated as well as labetalol, a Statin, most likely Zocor.   She will  maintain on a baby aspirin.   LABORATORIES:  Lipid profile, cholesterol 206, triglycerides 130, HDL  cholesterol 35, LDL cholesterol 145.  Cardiac markers in the emergency room  were less than 0.05 x3.  Troponin I studies were 0.01, then 0.01.  BNP was  less than 30.  TSH was 0.789.  Complete blood count on admission February  21:  White cells 7.2, hemoglobin 14.3, hematocrit  42.9, platelets 278.  Once  again, potassium on admission was 2.8.  Serum electrolytes on discharge were  sodium 140, potassium 3.5, chloride 109, carbonate 26, glucose 105, BUN 6,  creatinine 0.8.  Liver function studies on admission February 23:  Alkaline  phosphatase 112, SGOT 15, SGPT 11.      GM/MEDQ  D:  07/12/2004  T:  07/14/2004  Job:  [id number]   cc:   Thomas C. Wall, M.D.   [facility]

## 2010-10-03 NOTE — H&P (Signed)
Lauren [name], Lauren [name]           ACCOUNT NO.:  [id number]   MEDICAL RECORD NO.:  [id number]          PATIENT TYPE:  OIB   LOCATION:  2899                         FACILITY:  [facility]   PHYSICIAN:  Sanjeev K. Deveshwar, M.D.DATE OF BIRTH:  21-Jun-1950   DATE OF ADMISSION:  10/15/2004  DATE OF DISCHARGE:                                HISTORY & PHYSICAL   CHIEF COMPLAINT:  The patient is here for a cerebral angiogram today.   HISTORY OF PRESENT ILLNESS:  This is a pleasant 60 year old female with a  history of syncope dating back several years.  She was referred to Dr. Pearlean Brownie  for evaluation and was seen on August 11, 2004.  Dr. Marlis Edelson impression was  that the patient may be having complex partial seizures or vertebrobasilar  TIAs.  He recommended an MRI scan of the brain as well as a carotid  ultrasound and an EEG.  The MRA was performed on August 22, 2004.  This showed  mild to moderate changes of atheromatous disease involving the terminal  branches of the middle and posterior cerebral arteries.  There was a 2 mm  area of suspicious dilatation involving the anterior communicating artery  which may represent a vascular loop, though a small aneurysm could not be  ruled out.  The carotid arteries showed no significant plaque or stenosis.  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  This is being further evaluated.   Based on the results of the MRI/MRA it was recommended that patient undergo  a cerebral angiogram to further evaluate these findings.   PAST MEDICAL HISTORY:  1. Hyperlipidemia.  2. Hypertension.  3. The patient has a history of mild coronary artery disease followed by      Dr. Daleen Squibb with [facility].  She underwent a cardiac      catheterization in February of this year.  At that time she was found      to have a 30-40% proximal LAD, a 70% proximal ramus with a 50% mid      lesion and the left circumflex had 25 and 30% lesions.  The  ejection      fraction was estimated to be 60%.  She was felt to have nonobstructive      coronary artery disease with a borderline lesion in the ramus which was      a fairly small caliber vessel.  4. The patient also has been treated empirically for gastroesophageal      reflux disease.  5. She is currently on therapy for her hyperlipidemia.  6. She notes occasional chest pain.  7. She has a history of migraine headaches.     PAST SURGICAL HISTORY:  1. Previous tubal ligation.  2. Status post partial hysterectomy.  3. Status post breast biopsies.     ALLERGIES:  No known drug allergies.   CURRENT MEDICATIONS:  1. Lipitor 40 mg daily.  2. Enteric-coated aspirin 81 mg daily.  3. Hydrochlorothiazide 25 mg daily.  4. K-Dur 20 mEq daily.  5. Labetalol 200 mg b.i.d.  6. Multivitamin daily.  7. Protonix 40  mg daily.     SOCIAL HISTORY:  The patient is widowed.  She has three children.  She lives  in [place].  One of her sons lives with her.  She has been smoking a pack  of cigarettes per day for at least 39 years.  She does not use alcohol.  She  works for a [employer] as a Nature conservation officer.   FAMILY HISTORY:  Her mother died at age 36 possibly from congestive heart  failure.  Her father is still alive at 70.  He has some arthritis.  She has  a sister who is alive and well.   REVIEW OF SYSTEMS:  Completely negative except for the following.  The  patient is having some menopause symptoms.  She has a cough which is  nonproductive.  She has had some lower extremity edema.  She has dyspnea on  exertion.  She has a history of syncope as noted.  She has a history of  anxiety with occasional anxiety attacks and a history of arthritis.   LABORATORY DATA:  A PTT is 35.  An INR is 1.  A CBC reveals hemoglobin 14.6,  hematocrit 45.1, WBC 6.7, platelets 228,000.  A chemistry profile reveals  BUN 13, creatinine 0.8, glucose 105, potassium low normal at 3.6.   PHYSICAL  EXAMINATION:  GENERAL:  Very pleasant 60 year old African-American  female in no acute distress.  VITAL SIGNS:  Blood pressure 140/90, pulse 68, respirations 16, temperature  97.5, weight 160 pounds, oxygen saturation 99% on room air.  Airway was  rated as a 2.  ASA scale was rated as a 3.  HEENT:  Unremarkable.  NECK:  No bruits.  No jugular venous distention.  HEART:  Regular rate and rhythm without murmur.  LUNGS:  Clear.  ABDOMEN:  Soft, nontender.  EXTREMITIES:  Pulses weak, but intact.  There is trace edema.  SKIN:  Warm and dry.  NEUROLOGIC:  Mental status:  Patient is alert and oriented.  Follows  commands.  Cranial nerves II-XII grossly intact.  Sensation is grossly  intact.  Motor strength is 4/5 throughout.  Cerebellar testing is intact.   IMPRESSION:  1. Long history of syncope.  2. Recent abnormal MRI and MRA as noted above.  3. History of migraine headaches.  4. History of nonobstructive coronary artery disease with normal left      ventricular function.  5. History of hyperlipidemia.  6. Suspected gastroesophageal reflux disease.  7. Status post multiple surgeries.  8. History of anxiety.  9. History of hypokalemia.  10.Ongoing tobacco use.     PLAN:  The patient will undergo cerebral angiogram today to further evaluate  her abnormal MRI/MRA.  She was feeling very anxious prior to the study and  she was given Ativan 0.5 mg IV in preparation for the angiogram.      DR/MEDQ  D:  10/15/2004  T:  10/15/2004  Job:  [id number]   cc:   [facility]

## 2010-10-03 NOTE — H&P (Signed)
NAME:  Lauren [name], Lauren [name] NO.:  [id number]   MEDICAL RECORD NO.:  [id number]          PATIENT TYPE:  EMS   LOCATION:  MAJO                         FACILITY:  [facility]   PHYSICIAN:  Jonelle Sidle, M.D. LHCDATE OF BIRTH:  Oct 07, 1950   DATE OF ADMISSION:  07/10/2004  DATE OF DISCHARGE:                                HISTORY & PHYSICAL   REASON FOR ADMISSION:  Lauren [name] is a 60 year old female with a history  of nonobstructive coronary artery disease by previous cardiac  catheterization in 1999 and 2001 with multiple cardiac risk factors notable  for longstanding tobacco smoking, hypertension, history of  hypercholesterolemia and age.  He has been followed by Lauren [name] in  our office, and who underwent evaluation for dyspnea and chest pain with an  adenosine Cardiolite in March 2005.  This was negative for any evidence of  ischemia/infarction and revealed normal, left ventricular function.   The patient presents to the emergency room today with complaint of left-  sided chest pressure.  At times she described it as a squeezing sensation.  She rated this a 6 on a scale of 1-10 and felt that this was similar to her  precatheterization presentation in the past.  This occurred while standing  at her bench at work, and was associated with a significant sensation of  clamminess, followed by feelings of dizziness, and then a witnessed episode  of syncope.  She was assisted by her coworkers to her chair, and does not  know how long she was out.  She denied any observations of seizure-like  activity and she denied any loss of bowel or bladder function.   The patient was taken to the emergency room by EMS.  She reports being pain-  free at this time. She has not been treated with nitroglycerin, but only  with oxygen.   Of note, the patient also reports experiencing exertional chest  pressure/tightness and dyspnea over these past several weeks, after moving  her  new apartment.  This occurs when walking up flights of stairs, but does  not occur with her usual activities.  Also of note, the patient reports  having had syncopal episodes in the past.   ALLERGIES:  No known drug allergies.   MEDICATIONS PRIOR TO ADMISSION:  1.  Hydrochlorothiazide 25 mg daily.  2.  Aspirin 81 mg daily.  3.  Multivitamin 1 tablet daily.   PAST MEDICAL HISTORY:  1.  Nonobstructive coronary artery disease (as described above).  2.  History of mitral valve prolapse by cardiac catheterization 2001.  3.  Hypertension.  4.  History of hypercholesterolemia.  5.  History of syncope.  6.  Status post tubal ligation.   SOCIAL HISTORY:  The patient has been smoking tobacco, at least 1 pack a  day, since the age of 60.  Denies alcohol use.   FAMILY HISTORY:  Mother deceased at age 2, question cardiomyopathy.  Father, age 52, alive and well.   REVIEW OF SYSTEMS:  The patient reports exertional dyspnea and chest  discomfort over the past several weeks.  Denies orthopnea, PND or pedal  edema.  Denies palpitations, has occasional dizziness.  No recent evidence  of upper or lower GI bleeding.  The remaining systems are negative.   PHYSICAL EXAMINATION:  VITAL SIGNS:  Blood pressure 152/81, pulse 66 and  regular, respirations 18, temperature 97.7.  Saturation 100% on room air.  GENERAL:  A 60 year old female in no apparent distress.  HEENT:  Normocephalic, atraumatic.  NECK:  Adequate pulses without carotid bruits.  No JVD.  LUNGS:  Clear to auscultation in all fields.  HEART:  Regular rate and rhythm (S1-S2).  No murmurs, rubs or gallops.  ABDOMEN:  Soft, nontender,  intact bowel sounds, without bruits.  EXTREMITIES:  Preserved bilateral femoral pulses are soft. Left femoral  bruit; intact distal pulses, without edema.  NEUROLOGIC:  No focal deficit.   CHEST X-RAY:  Mild vascular congestion.   EKG:  Normal sinus rhythm at 60 bpm with normal axis; no ischemic  changes.   LABORATORY DATA:  Hemoglobin 14.3, hematocrit 42.9, WBC 7.2, platelets 278.  Sodium 141, potassium 3.1, glucose 88, BUN 7, creatinine 0.8.  Cardiac  enzymes (POC):  MB less than 1.0, troponin I less than 0.05 (____________).   IMPRESSION:  1.  Atypical chest discomfort.      1.  Followed by witnessed syncope (question vasovagal).  2.  Nonobstructive coronary artery disease.      1.  Status post cardiac catheterization in 1999 and 2001.      2.  Negative Adenosine Cardiolite in March 2005.  3.  Normal left ventricular function.  4.  History of mitral valve prolapse.  5.  Longstanding tobacco.  6.  Hypertension.  7.  History of hypercholesterolemia.  8.  Exertional dyspnea.  9.  Hyperkalemia.   PLAN:  The patient will be admitted to telemetry for further monitoring and  evaluation of recurrent chest discomfort and associated syncope.  Given her  recent history of exertional chest discomfort and dyspnea and in light of  multiple cardiac risk factors with a negative Cardiolite 1 year ago,  recommendations are to proceed with a repeat cardiac catheterization given  that she has not been studied since 2001.  The patient is agreeable with  this plan and risks/benefits have been discussed.  If cardiac  catheterization is negative, then the patient could be discharged and  proceed with outpatient evaluation for syncope with an event recorder and  followup with Lauren [name].  The patient will be continued on current  medications, with the addition of low dose beta blocker, intravenous  nitroglycerin and heparin.  Potassium will be repleted and we will check a  followup metabolic profile/magnesium level in the morning.  In addition, we  will also check a fasting lipid profile, TSH, and a BNP level.  Orthostatic  blood pressure readings will also be checked.      GS/MEDQ  D:  07/10/2004  T:  07/10/2004  Job:  [id number]  cc:   [facility]

## 2010-10-03 NOTE — Assessment & Plan Note (Signed)
[facility]                             PULMONARY OFFICE NOTE   NAME:WILLIAMSONAlizae, [name]                  MRN:          [id number]  DATE:09/16/2006                            DOB:          16-Aug-1950    REASON FOR CONSULTATION:  Evaluation for cough, dyspnea and possible  COPD history.   This is a 60 year old black female who was actually seen by Dr. 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  The patient states she was  better while being treated with Aciphex and Advair, and is not sure  which one she stopped first; but did fine for 4 years except with bad  colds.  Then did fine for years off of all therapy and distinctly  remembers when she ran out of Advair; it did not seem to make any  difference so she did not continue taking it.  However, a year ago,  after having been off of all therapy for several years, she began being  much worse than usual.  With persistent cough to the point where she  vomits on almost a daily basis, associated with dyspnea that is  occurring both at rest and with activity.  She denies any excess fever,  orthopnea, PND or chest pain, leg swelling, itching, sneezing or  subjective wheezing.   PAST MEDICAL HISTORY:  1. Hypertension.  2. Ischemic heart disease; for which she is on lisinopril, aspirin,      high dose metoprolol.   ALLERGIES:  NONE KNOWN.   SOCIAL HISTORY:  She continues to smoke one pack per day.  She is  unemployed.  Denies any unusual travel, pet or hobby exposure.   FAMILY HISTORY:  Positive for heart disease in her mother.  Lymphoma in  her sister.   REVIEW OF SYSTEMS:  Taken from detail in the worksheet; negative except  as outlined above.   PERTINENT EXAMINATION:  This is a slightly anxious white female.  She is  afebrile and normal vital signs, with a mint in her mouth.  She is  edentulous.  Oropharynx is  clear.  Nasal clear.  Normal ear canals.  Neck was supple without cervical adenopathy or tenderness.  Trachea is  midline.  Her lung fields were clear bilaterally, but she had prominent  pseudo wheeze.  Cardiovascular reveals regular rhythm without murmurs,  rubs or gallops.  Abdomen was soft, benign.  Extremities warm without  calf tenderness, cyanosis, clubbing.   PFTs revealed no evidence of significant airflow obstruction, dated  September 03, 2005; with inspiratory truncation noted on one of the flow  volume.   IMPRESSION:  Classic upper airways dysfunction, as suggested previously  by Dr. Jayme Cloud.  Previously felt to be GERD.  At this time she probably  not only has GERD, but also has significant ACE inhibitor intolerance.  I have personally found the combination of GERD and ACE inhibitors to be  synergistic in terms of airway destabilization, because one serves to  inflame the other and the ACE inhibitors exacerbate the inflammation  that ensues  from LPR (extraesophageal reflux).   My recommendation, therefore, was to stop lisinopril and start Diovan  160/25 one daily (I gave her 8 weeks of samples).  I gave her only a 2  week supply of Nexium, so we do not find ourselves in the same conundrum  in 4 years, namely for the patient not to remember which one she stopped  first.  That is, I am only going to give her 2 weeks of Nexium and then  off, and then see if her symptoms exacerbate off the Nexium.   If her symptoms have improved, I will just ask her to see Dr. Renato Gails for  follow-up and refill of medications.  Otherwise, we will see her back  here.   I would also note parenthetically she is on very high doses of  metoprolol.  If I am wrong about the diagnosis of upper airways  dysfunction, we may need to re-explore the potential that some of this  is asthma-driven by high doses of a beta blocker, albeit relatively  selective in the case of metoprolol.     Charlaine Dalton. Sherene Sires, MD,  [facility]  Electronically Signed    MBW/MedQ  DD: 09/16/2006  DT: 09/17/2006  Job #: [id number]   cc:   Alanson Puls, M.D.

## 2010-10-03 NOTE — Cardiovascular Report (Signed)
[place]  Patient:    Lauren [name], Lauren [name]                  MRN: [id number] Proc. Date: 04/26/00 Adm. Date:  [id number] Attending:  Loreli Dollar CC:         [facility]  Cath Lab   Cardiac Catheterization  INDICATIONS:  Ms. Daleo is a 60 year old hypertensive, hyperlipidemic female who had an unremarkable cardiac catheterization in 1999.  She has continued to have some mild chest pain and awareness of palpitations off and on since that time and was trapped in an elevator where she had what sounded like a panic attack.  Because of this, a nuclear study was performed that raised the concern of mild inferior ischemia and she is brought to the catheterization lab for a repeat cardiac catheterization.  RESULTS:  HEMODYNAMIC MONITORING:  Central aortic pressure 179/98, left ventricular pressure 172/27.  There was no significant valve gradient noted at the time of pullback.  VENTRICULOGRAPHY:  Ventriculography in the RAO projection revealed frequent PVCs.  There was normal left ventricular systolic function, mitral valve prolapse was seen.  The ejection fraction was clearly normal and the end diastolic pressure was 25.  CORONARY ARTERIOGRAPHY:  There was calcification noted in the optional diagonal which was a medium size vessel.  Left main normal.  LAD.  The LAD extended down to the apex of the heart and had a gradual taper. In the proximal portion of the LAD was mild irregularities.  The first diagonal was free of disease.  Circumflex.  The circumflex was a dominant vessel.  It had two OMs and  PDA. There were minimal irregularities in the midportion of the circumflex.  The PDA itself and the OMs were free of disease.  Optional diagonal.  The optional diagonal was a medium size vessel.  The entire proximal third had areas of 40 to 50% narrowing.  This is also the area of the calcium noted on fluoroscopy.  Right  coronary artery.  The right coronary artery was a small nondominant vessel supplying only the right ventricle.  CONCLUSION: 1. Normal left ventricular systolic function. 2. Minimal disease in the left anterior descending and circumflex with mild    to moderate disease and with calcification in optional diagonal. 3. Mitral valve prolapse.  At this point there is nothing to suggest she has high grade stenosis that warrants any type of intervention.  She does need aggressive lipid management, blood pressure control, and risk factor modification. DD:  04/26/00 TD:  04/26/00 Job: [id number]

## 2010-10-09 ENCOUNTER — Ambulatory Visit (INDEPENDENT_AMBULATORY_CARE_PROVIDER_SITE_OTHER): Payer: Medicare Other | Admitting: Family Medicine

## 2010-10-09 ENCOUNTER — Encounter: Payer: Self-pay | Admitting: Family Medicine

## 2010-10-09 VITALS — BP 104/64 | HR 67 | Temp 98.3°F | Wt 140.5 lb

## 2010-10-09 DIAGNOSIS — L02214 Cutaneous abscess of groin: Secondary | ICD-10-CM

## 2010-10-09 DIAGNOSIS — L02219 Cutaneous abscess of trunk, unspecified: Secondary | ICD-10-CM

## 2010-10-09 DIAGNOSIS — L03319 Cellulitis of trunk, unspecified: Secondary | ICD-10-CM

## 2010-10-09 MED ORDER — CHLORHEXIDINE GLUCONATE 4 % EX LIQD: Freq: Every day | CUTANEOUS | Status: AC | PRN

## 2010-10-09 MED ORDER — HYDROCODONE-ACETAMINOPHEN 5-500 MG PO TABS: 2.0000 | ORAL_TABLET | Freq: Four times a day (QID) | ORAL | Status: DC | PRN

## 2010-10-09 MED ORDER — SULFAMETHOXAZOLE-TRIMETHOPRIM 800-160 MG PO TABS: 1.0000 | ORAL_TABLET | Freq: Two times a day (BID) | ORAL | Status: AC

## 2010-10-09 NOTE — Patient Instructions (Signed)
Abscess/Boil (Furuncle) An abscess (boil or furuncle) is an infected area that contains a collection of pus.   SYMPTOMS Signs and symptoms of an abscess include pain, tenderness, redness, or hardness. You may feel a moveable soft area under your skin. An abscess can occur anywhere in the body.   TREATMENT An incision (cut by the caregiver) may have been made over your abscess so the pus could be drained out. Gauze may have been packed into the space or a drain may have been looped thru the abscess cavity (pocket). This provides a drain that will allow the cavity to heal from the inside outwards. The abscess may be painful for a few days, but should feel much better if it was drained. Your abscess, if seen early, may not have localized and may not have been drained. If not, another appointment may be required if it does not get better on its own or with medications. HOME CARE INSTRUCTIONS  Only take over-the-counter or prescription medicines for pain, discomfort, or fever as directed by your caregiver.   Keep the skin and clothes clean around your abscess.   If the abscess was drained, you will need to use gauze dressing ("4x4") to collect any draining pus. These dressing typically will need to be changed 3 or more times during the day.   The infection may spread by skin contact with others. Avoid skin contact as much as possible.   Good hygiene is very important including regular hand washing, cover any draining skin lesions, and don't share personal care items.   If you participate in sports do not share athletic equipment, towels, whirlpools, or personal care items. Shower after every practice or tournament.   If a draining area cannot be adequately covered:   Do not participate in sports   Children should not participate in day care until the wound has healed or drainage stops.   If your caregiver has given you a follow-up appointment, it is very important to keep that appointment. Not  keeping the appointment could result in a much worse infection, chronic or permanent injury, pain, and disability. If there is any problem keeping the appointment, you must call back to this facility for assistance.  SEEK MEDICAL CARE IF:  You develop increased pain, swelling, redness, drainage, or bleeding in the wound site.   You develop signs of generalized infection including muscle aches, chills, fever, or a general ill feeling.   You or your child has an oral temperature above 100.5.   Your baby is older than 3 months with a rectal temperature of 100.5 F (38.1 C) or higher for more than 1 day.  See your caregiver as directed for a recheck or sooner if you develop any of the symptoms described above. Take antibiotics (medicine that kills germs) as directed if they were prescribed. MAKE SURE YOU:    Understand these instructions.   Will watch your condition.   Will get help right away if you are not doing well or get worse.   Please return to clinic tomorrow to recheck the abscess Document Released: 02/11/2005 Document Re-Released: 10/22/2009 [facility] Patient Information 2011 [place], [place].

## 2010-10-09 NOTE — Assessment & Plan Note (Addendum)
Procedure note:  Informed consent obtained.  Pt was prepped and draped in normal sterile fashion.  Cleaned with betadeine.  Local anesthetic (Lidocaine) was used to numb the area.  Pt did hyperventilate during numbing requiring supplemental O2 because of pain.  Incision and drainage performed on the abscess.  Was able to get a fairly significant incision but not full length because of pain.  Large amount or purulent material was expressed.  Inadequate packing because of pain.  Minimal blood loss.  Pt back to baseline after procedure  I think that we were able to get a good enough incision that should help with healing.  Will also start oral antibiotics.  Handout given.  Precautions given for return of abscess or fever for her to go to the ED.  Pt voiced her understanding.  Handout given.  Total time with patient 40 minutes

## 2010-10-09 NOTE — Progress Notes (Signed)
  Subjective:    Patient ID: SEQUOIA WITZ, female    DOB: May 10, 1951, 60 y.o.   MRN: [id number]  HPI  1. Abscess:  Has been there for about one week.  Located in the right groin.  She has had abscesses in her groin and in other areas before.  It has been getting bigger.  It is red, swollen, and tender.  Pain rated a 9/10.  It is not draining.  Nothing makes it better.  Hurts to walk.  Review of Systems Denies fevers, chills, other skin rash.  Denies dysuria.  Denies vaginal discharge.    Objective:   Physical Exam  Vitals reviewed. Constitutional: No distress.       Afebrile  Cardiovascular: Normal rate.   Pulmonary/Chest: Effort normal.  Abdominal: Soft. She exhibits no distension. There is no tenderness.  Genitourinary: No vaginal discharge found.  Skin:       Large abscess (4x5cm) in the right groin.  Very tender to palpation.  Some surrounding induration and erythema.  Not draining.  Fluctuant.          Assessment & Plan:

## 2010-10-12 LAB — WOUND CULTURE

## 2010-10-14 ENCOUNTER — Ambulatory Visit (INDEPENDENT_AMBULATORY_CARE_PROVIDER_SITE_OTHER): Payer: Medicare Other | Admitting: Family Medicine

## 2010-10-14 ENCOUNTER — Encounter: Payer: Self-pay | Admitting: Psychology

## 2010-10-14 ENCOUNTER — Ambulatory Visit (INDEPENDENT_AMBULATORY_CARE_PROVIDER_SITE_OTHER): Payer: Medicare Other | Admitting: Psychology

## 2010-10-14 ENCOUNTER — Encounter: Payer: Self-pay | Admitting: Family Medicine

## 2010-10-14 DIAGNOSIS — L03319 Cellulitis of trunk, unspecified: Secondary | ICD-10-CM

## 2010-10-14 DIAGNOSIS — F330 Major depressive disorder, recurrent, mild: Secondary | ICD-10-CM

## 2010-10-14 DIAGNOSIS — L02214 Cutaneous abscess of groin: Secondary | ICD-10-CM

## 2010-10-14 NOTE — Patient Instructions (Signed)
Follow up with Dr. Lula Olszewski as scheduled. Continue to care for your wound as before. If you notice fever, pain, swelling or notice pus drainage of collecting, come back in

## 2010-10-14 NOTE — Progress Notes (Signed)
  Subjective:    Patient ID: Lauren [name], female    DOB: Nov 22, 1950, 60 y.o.   MRN: [id number]  HPI  1) Abscess:  Seen on 5/24 by Dr. Lelon Perla for right groin abscess x 1 week. I+D and packing performed at that time, patient started on Bactrim DS. Tolerating antibiotic well without side effects. Reports that packing fell out on 5/27. Denies purulent drainage, fever, nausea, emesis, lethargy. Boil has continued to improve significantly from initial presentation.  Wound culture with multiple organisms - no sensitivities done.    Pertinent past medical history reviewed   Review of Systems As per HPI    Objective:   Physical Exam  Physical Exam  Vitals reviewed. Constitutional: No distress. Afebrile  Cardiovascular: Normal rate.   Pulmonary/Chest: Effort normal.  Abdominal: Soft. She exhibits no distension. There is no tenderness.  Skin: Area of induration 4 x 5 cm in right groin with central I+D wound (1 cm long) without fluctuance or purulent drainage. Non tender to palpation. No erythema. Large abscess (4x5cm) in the right groin.  Very tender to palpation.  Some surrounding induration and erythema.  Not draining.  Fluctuant.     Assessment & Plan:

## 2010-10-14 NOTE — Assessment & Plan Note (Signed)
Continue course of antibiotics. Advised on wound care and red flags. Follow up with primary physician as needed.

## 2010-10-14 NOTE — Assessment & Plan Note (Addendum)
Report of mood is within normal limits.  Affect is consistent.  Thoughts are clear and goal directed.  She does not seem overly affected by the date that never transpired.  This would have been the first time that she met Saleem in person.  Her sense of self-worth is fairly stable.  Her view of the situation is partly related to her sense of purpose after her first cancer diagnosis - she is here to love people and lift them up.  She plans to remain open to [facility] while recognizing things may not be as bright as she once thought.  She acknowledges the possibility of being emotionally "bruised" but does not feel at risk of much more.  Did not have time to address smoking other than a quick assessment.  Remains contemplative.  She mentioned she stopped taking her "depression pill" but noted it to be her pravachol.  Corrected this and asked if she was taking the Celexa.  She did not know.  Agreed to revisit medication during her next appt which is scheduled for June 28th at 2:00.

## 2010-10-14 NOTE — Progress Notes (Signed)
Hailley presented 10 minutes late for her appointment because she was pulled to her PCPs appointment at 3:15 first.  We reviewed some medical issues and then focused on relationships.  Saleem - her long distance man-friend was to come for a visit for her birthday (Friday) and the holiday weekend.  He did not and did not communicate this in a very timely fashion.  Loreley reported being hurt and disappointed.  Discussed limits in relationships and the concept of red flags.

## 2010-10-17 ENCOUNTER — Telehealth: Payer: Self-pay | Admitting: Family Medicine

## 2010-10-17 DIAGNOSIS — H5200 Hypermetropia, unspecified eye: Secondary | ICD-10-CM

## 2010-10-17 DIAGNOSIS — H5210 Myopia, unspecified eye: Secondary | ICD-10-CM

## 2010-10-17 NOTE — Telephone Encounter (Signed)
Ms. Wingate need referral to optometrist.  Please call her about this.

## 2010-10-20 NOTE — Telephone Encounter (Signed)
Fwd to PCP

## 2010-10-22 NOTE — Telephone Encounter (Signed)
Called patient.  She says she has not seen an eye doctor in more than a year.  She wears glasses for both reading and driving (distance).  She feels that her vision has gotten worse.  She last saw an eye doctor in [place], and would like to see one in [place].

## 2010-10-23 NOTE — Telephone Encounter (Signed)
Appt scheduled with [facility] For 11/10/10 at 10:00am, patient informed.

## 2010-11-13 ENCOUNTER — Ambulatory Visit (INDEPENDENT_AMBULATORY_CARE_PROVIDER_SITE_OTHER): Payer: Medicare Other | Admitting: Psychology

## 2010-11-13 ENCOUNTER — Encounter: Payer: Self-pay | Admitting: Psychology

## 2010-11-13 DIAGNOSIS — F17200 Nicotine dependence, unspecified, uncomplicated: Secondary | ICD-10-CM

## 2010-11-13 DIAGNOSIS — F330 Major depressive disorder, recurrent, mild: Secondary | ICD-10-CM

## 2010-11-13 NOTE — Progress Notes (Signed)
Lauren [name] reports she continues to do well.  She voiced that she is working on being patient.  She is content.  Happy in many ways.  She continues to smoke and that was a big focus of our visit as well as her continued relationship with Saleem.

## 2010-11-13 NOTE — Assessment & Plan Note (Addendum)
Importance is a 7.  Confidence is a 3.  She does not want to quit mostly because her confidence is so low.  She thinks she needs an inpatient program - something structured that mimics a rehab facility because she will need to learn how to feel normal without cigarettes.  She recognizes some of the risks of continued smoking with the biggest one being disappointing or hurting her family in some way.  She has tried the [employer] and [employer].  Certainly she hasn't exhausted potential strategies but she is not interested in quitting presently.  She did not get defensive with me around this issue however her thoughts reflect a sense of powerlessness that is at least somewhat tied to the fact that she has been smoking for 44 years.  This is more than a small part of her.  Need to continue to honor her autonomy and to not judge.    Patient never started Wellbutrin.  Marked as "not taking" on medication list.

## 2010-11-13 NOTE — Assessment & Plan Note (Addendum)
Well controlled per patient report.  Denies taking any medication at present.  Marked Celexa as "not taking" under medications.  Appetite is better.  Patient has gained the weight she wanted to gain back and would like to stay where she is.  No difficulty noted with mood or function.  Will continue to see as needed to help prevent relapse or reoccurrence.

## 2010-11-25 ENCOUNTER — Encounter (HOSPITAL_COMMUNITY): Payer: Self-pay

## 2010-11-25 ENCOUNTER — Other Ambulatory Visit: Payer: Self-pay | Admitting: Oncology

## 2010-11-25 ENCOUNTER — Encounter (HOSPITAL_BASED_OUTPATIENT_CLINIC_OR_DEPARTMENT_OTHER): Payer: Medicare Other | Admitting: Oncology

## 2010-11-25 ENCOUNTER — Ambulatory Visit (HOSPITAL_COMMUNITY)
Admission: RE | Admit: 2010-11-25 | Discharge: 2010-11-25 | Disposition: A | Discharge status: Home / Self Care | Payer: Medicare Other | Source: Ambulatory Visit | Attending: Oncology | Admitting: Oncology

## 2010-11-25 DIAGNOSIS — R0789 Other chest pain: Secondary | ICD-10-CM | POA: Insufficient documentation

## 2010-11-25 DIAGNOSIS — C189 Malignant neoplasm of colon, unspecified: Secondary | ICD-10-CM | POA: Insufficient documentation

## 2010-11-25 DIAGNOSIS — I517 Cardiomegaly: Secondary | ICD-10-CM | POA: Insufficient documentation

## 2010-11-25 DIAGNOSIS — R599 Enlarged lymph nodes, unspecified: Secondary | ICD-10-CM | POA: Insufficient documentation

## 2010-11-25 DIAGNOSIS — J438 Other emphysema: Secondary | ICD-10-CM | POA: Insufficient documentation

## 2010-11-25 DIAGNOSIS — R05 Cough: Secondary | ICD-10-CM | POA: Insufficient documentation

## 2010-11-25 DIAGNOSIS — I10 Essential (primary) hypertension: Secondary | ICD-10-CM

## 2010-11-25 DIAGNOSIS — R059 Cough, unspecified: Secondary | ICD-10-CM | POA: Insufficient documentation

## 2010-11-25 DIAGNOSIS — J984 Other disorders of lung: Secondary | ICD-10-CM

## 2010-11-25 DIAGNOSIS — I2510 Atherosclerotic heart disease of native coronary artery without angina pectoris: Secondary | ICD-10-CM | POA: Insufficient documentation

## 2010-11-25 DIAGNOSIS — C187 Malignant neoplasm of sigmoid colon: Secondary | ICD-10-CM

## 2010-11-25 DIAGNOSIS — C3490 Malignant neoplasm of unspecified part of unspecified bronchus or lung: Secondary | ICD-10-CM | POA: Insufficient documentation

## 2010-11-25 LAB — CMP (CANCER CENTER ONLY)
Albumin: 3.1 g/dL — ABNORMAL LOW (ref 3.3–5.5)
Alkaline Phosphatase: 85 U/L — ABNORMAL HIGH (ref 26–84)
BUN, Bld: 13 mg/dL (ref 7–22)
CO2: 29 mEq/L (ref 18–33)
Calcium: 9.1 mg/dL (ref 8.0–10.3)
Chloride: 105 mEq/L (ref 98–108)
Glucose, Bld: 115 mg/dL (ref 73–118)
Potassium: 4 mEq/L (ref 3.3–4.7)
Sodium: 143 mEq/L (ref 128–145)
Total Protein: 6.7 g/dL (ref 6.4–8.1)

## 2010-11-25 LAB — CBC WITH DIFFERENTIAL/PLATELET
Basophils Absolute: 0 10*3/uL (ref 0.0–0.1)
Eosinophils Absolute: 0.1 10*3/uL (ref 0.0–0.5)
HGB: 13.9 g/dL (ref 11.6–15.9)
MCV: 76.2 fL — ABNORMAL LOW (ref 79.5–101.0)
MONO#: 0.5 10*3/uL (ref 0.1–0.9)
NEUT#: 4.7 10*3/uL (ref 1.5–6.5)
RBC: 5.58 10*6/uL — ABNORMAL HIGH (ref 3.70–5.45)
RDW: 16.9 % — ABNORMAL HIGH (ref 11.2–14.5)
WBC: 6.8 10*3/uL (ref 3.9–10.3)
lymph#: 1.5 10*3/uL (ref 0.9–3.3)

## 2010-11-25 LAB — CEA: CEA: 3.5 ng/mL (ref 0.0–5.0)

## 2010-11-25 MED ORDER — IOHEXOL 300 MG/ML  SOLN
80.0000 mL | Freq: Once | INTRAMUSCULAR | Status: AC | PRN
  Administered 2010-11-25: 80 mL via INTRAVENOUS

## 2010-11-27 ENCOUNTER — Encounter (HOSPITAL_BASED_OUTPATIENT_CLINIC_OR_DEPARTMENT_OTHER): Payer: Medicare Other | Admitting: Oncology

## 2010-11-27 ENCOUNTER — Other Ambulatory Visit: Payer: Self-pay | Admitting: Oncology

## 2010-11-27 DIAGNOSIS — C187 Malignant neoplasm of sigmoid colon: Secondary | ICD-10-CM

## 2010-11-27 DIAGNOSIS — J984 Other disorders of lung: Secondary | ICD-10-CM

## 2010-11-27 DIAGNOSIS — C189 Malignant neoplasm of colon, unspecified: Secondary | ICD-10-CM

## 2010-11-27 DIAGNOSIS — I10 Essential (primary) hypertension: Secondary | ICD-10-CM

## 2010-12-02 ENCOUNTER — Encounter: Payer: Self-pay | Admitting: Family Medicine

## 2010-12-02 ENCOUNTER — Ambulatory Visit (INDEPENDENT_AMBULATORY_CARE_PROVIDER_SITE_OTHER): Payer: Medicare Other | Admitting: Psychology

## 2010-12-02 ENCOUNTER — Ambulatory Visit (INDEPENDENT_AMBULATORY_CARE_PROVIDER_SITE_OTHER): Payer: Medicare Other | Admitting: Family Medicine

## 2010-12-02 DIAGNOSIS — Z113 Encounter for screening for infections with a predominantly sexual mode of transmission: Secondary | ICD-10-CM | POA: Insufficient documentation

## 2010-12-02 DIAGNOSIS — G8918 Other acute postprocedural pain: Secondary | ICD-10-CM

## 2010-12-02 DIAGNOSIS — F529 Unspecified sexual dysfunction not due to a substance or known physiological condition: Secondary | ICD-10-CM

## 2010-12-02 DIAGNOSIS — E7800 Pure hypercholesterolemia, unspecified: Secondary | ICD-10-CM

## 2010-12-02 DIAGNOSIS — L03319 Cellulitis of trunk, unspecified: Secondary | ICD-10-CM

## 2010-12-02 DIAGNOSIS — F17200 Nicotine dependence, unspecified, uncomplicated: Secondary | ICD-10-CM

## 2010-12-02 DIAGNOSIS — F330 Major depressive disorder, recurrent, mild: Secondary | ICD-10-CM

## 2010-12-02 DIAGNOSIS — I10 Essential (primary) hypertension: Secondary | ICD-10-CM

## 2010-12-02 DIAGNOSIS — F659 Paraphilia, unspecified: Secondary | ICD-10-CM

## 2010-12-02 DIAGNOSIS — Z85118 Personal history of other malignant neoplasm of bronchus and lung: Secondary | ICD-10-CM | POA: Insufficient documentation

## 2010-12-02 DIAGNOSIS — L02214 Cutaneous abscess of groin: Secondary | ICD-10-CM

## 2010-12-02 HISTORY — DX: Unspecified sexual dysfunction not due to a substance or known physiological condition: F52.9

## 2010-12-02 MED ORDER — NEBIVOLOL HCL 10 MG PO TABS: 10.0000 mg | ORAL_TABLET | Freq: Every day | ORAL | Status: DC

## 2010-12-02 MED ORDER — NITROGLYCERIN 0.4 MG SL SUBL: 0.4000 mg | SUBLINGUAL_TABLET | SUBLINGUAL | Status: DC | PRN

## 2010-12-02 MED ORDER — HYDROCHLOROTHIAZIDE 25 MG PO TABS: 25.0000 mg | ORAL_TABLET | Freq: Every day | ORAL | Status: DC

## 2010-12-02 MED ORDER — TRAMADOL HCL 50 MG PO TABS: 50.0000 mg | ORAL_TABLET | Freq: Four times a day (QID) | ORAL | Status: DC | PRN

## 2010-12-02 MED ORDER — ASPIRIN 81 MG PO TBEC: 81.0000 mg | DELAYED_RELEASE_TABLET | Freq: Every day | ORAL | Status: DC

## 2010-12-02 MED ORDER — CLONIDINE HCL 0.1 MG PO TABS: 0.1000 mg | ORAL_TABLET | Freq: Every day | ORAL | Status: DC

## 2010-12-02 MED ORDER — PRAVASTATIN SODIUM 80 MG PO TABS: 80.0000 mg | ORAL_TABLET | Freq: Every day | ORAL | Status: DC

## 2010-12-02 NOTE — Assessment & Plan Note (Signed)
Preparation stage for cutting back (not quitting) smoking.  Most positive self-talk since her lung cancer diagnosis.  Desire for cutting back is present - something that tapered off considerably the last few months.  Motivation is tied to a loved-ones religious holiday fasting.  Hashed out some aspects of her plan and she was going to continue post meeting.  Will follow as soon as possible which is in about two weeks from today.

## 2010-12-02 NOTE — Patient Instructions (Signed)
It was good to see you.  We are going to try to taper off your Celexa (Citalopram) because of the side effects.  I want you to take 1/2 of the pill once a day in the morning for one week.  Then stop the medication completely.  Please keep your appointment with Dr. Pascal Lux and be sure to discuss how your mood is doing.   I want to check your cholesterol and some other blood work.  Please make a lab appointment for first thing in the morning to have your lab work drawn.  Do not eat or drink anything but water before your labs.    Please make your next appointment a Well Woman check- we will do your pap smear.  Please also call to schedule a mammogram.

## 2010-12-02 NOTE — Patient Instructions (Signed)
Please schedule a follow-up for:  August 2nd at 2:00. Identify a plan for your smoking including taking a closer look at what is reasonable.  How many cigarettes a day or the pattern of smoking.  Often times, it is helpful to look for the easiest cigarettes to give up initially.  Today you identify the toughest to give up and that is a good start.  WRITING THIS DOWN is a good idea.  I gave you a smoking log to help identify some patterns.  You may consider asking yourself, "How badly do I want this cigarette?"  If it is not too high, you may want to wait a while and see what happens.

## 2010-12-02 NOTE — Assessment & Plan Note (Signed)
Concern that this is from side effect of celexa.  Discussed with Dr. Pascal Lux and with patient.  Her depression is under good control at this time, and she would like to try to taper off.  If depression symptoms return will consider Wellbutrin as the sexual side effects seem to be bothering her a great deal.

## 2010-12-02 NOTE — Progress Notes (Signed)
Lauren [name] reports a recent CT scan showed no changes in the small lesion in her lungs.  This was good news for her to hear.  Things continue to go well.  She has hatched a tentative plan to cut back on her smoking at the same time that her long distance love celebrates Ramadan.  We discussed this at length.

## 2010-12-02 NOTE — Progress Notes (Signed)
  Subjective:    Patient ID: Lauren [name], female    DOB: Aug 26, 1950, 60 y.o.   MRN: [id number]  HPI  Patient presents for follow up.  She complains that she wants her estrogen checked because she thinks something is wrong.  After further discussion, patient admits that she is unable to climax during sexual intercourse.  This has been frustrating for her and her partner, and caused some problems in the relationship.  She has difficulty saying when this first started, but feels like it has been going on for years.   Lauren [name] feels like her mood has been much better the past few months.  She has been going to therapy and denies depressive symptoms.  She recently told Dr. Pascal Lux that she was not taking Celexa, but says today that she is taking it, and that she got confused about her medications before.   HTN- patient taking, nebivolol, clonidine and HCTZ without side effects.  She denies palpitations, chest pain, shortness of breath.  She does not monitor bp at home.  She has not had creatinine or electrolytes checked in our office in over a year.   HLD- patient taking pravachol, has not had lipid panel in over a year.    Also, patient is currently sexually active and would like to be checked for STD's.  She is not currently having symptoms.    Review of Systems Negative except HPI.     Objective:   Physical Exam BP 135/78  Pulse 55  Temp 98.5 F (36.9 C)  Wt 148 lb 11.2 oz (67.45 kg) General appearance: alert, cooperative and no distress Eyes: conjunctivae/corneas clear. PERRL, EOM's intact. Fundi benign. Neck: no adenopathy, no carotid bruit, no JVD, supple, symmetrical, trachea midline and thyroid not enlarged, symmetric, no tenderness/mass/nodules Lungs: clear to auscultation bilaterally Heart: regular rate and rhythm, S1, S2 normal, no murmur, click, rub or gallop Abdomen: soft, non-tender; bowel sounds normal; no masses,  no organomegaly Extremities: extremities normal,  atraumatic, no cyanosis or edema Pulses: 2+ and symmetric Skin: Skin color, texture, turgor normal. No rashes or lesions       Assessment & Plan:

## 2010-12-02 NOTE — Assessment & Plan Note (Signed)
Will check Fasting lipid panel.

## 2010-12-02 NOTE — Assessment & Plan Note (Signed)
Well controlled at this time.  Will try taper off Celexa.

## 2010-12-02 NOTE — Assessment & Plan Note (Signed)
Well controlled.  Will check BMET to monitor sodium, renal function.

## 2010-12-03 ENCOUNTER — Other Ambulatory Visit: Payer: Self-pay | Admitting: Family Medicine

## 2010-12-03 DIAGNOSIS — Z1231 Encounter for screening mammogram for malignant neoplasm of breast: Secondary | ICD-10-CM

## 2010-12-08 ENCOUNTER — Other Ambulatory Visit: Payer: Medicare Other

## 2010-12-08 DIAGNOSIS — I10 Essential (primary) hypertension: Secondary | ICD-10-CM

## 2010-12-08 DIAGNOSIS — Z113 Encounter for screening for infections with a predominantly sexual mode of transmission: Secondary | ICD-10-CM

## 2010-12-08 DIAGNOSIS — E7800 Pure hypercholesterolemia, unspecified: Secondary | ICD-10-CM

## 2010-12-08 LAB — HIV ANTIBODY (ROUTINE TESTING W REFLEX): HIV: NONREACTIVE

## 2010-12-08 LAB — LIPID PANEL
Cholesterol: 190 mg/dL (ref 0–200)
HDL: 39 mg/dL — ABNORMAL LOW (ref >39)
Total CHOL/HDL Ratio: 4.9 Ratio

## 2010-12-08 LAB — BASIC METABOLIC PANEL
CO2: 28 mEq/L (ref 19–32)
Calcium: 9.1 mg/dL (ref 8.4–10.5)
Sodium: 139 mEq/L (ref 135–145)

## 2010-12-08 LAB — RPR

## 2010-12-08 NOTE — Progress Notes (Signed)
RPR,HIV,BMP AND FLP DONE TODAY Lauren [name]

## 2010-12-09 ENCOUNTER — Encounter: Payer: Self-pay | Admitting: Family Medicine

## 2010-12-12 ENCOUNTER — Ambulatory Visit (HOSPITAL_COMMUNITY)
Admission: RE | Admit: 2010-12-12 | Discharge: 2010-12-12 | Disposition: A | Discharge status: Home / Self Care | Payer: Medicare Other | Source: Ambulatory Visit | Attending: Family Medicine | Admitting: Family Medicine

## 2010-12-12 DIAGNOSIS — Z1231 Encounter for screening mammogram for malignant neoplasm of breast: Secondary | ICD-10-CM | POA: Insufficient documentation

## 2010-12-18 ENCOUNTER — Encounter: Payer: Self-pay | Admitting: Psychology

## 2010-12-18 ENCOUNTER — Ambulatory Visit (INDEPENDENT_AMBULATORY_CARE_PROVIDER_SITE_OTHER): Payer: Medicare Other | Admitting: Psychology

## 2010-12-18 DIAGNOSIS — F330 Major depressive disorder, recurrent, mild: Secondary | ICD-10-CM

## 2010-12-18 DIAGNOSIS — F17200 Nicotine dependence, unspecified, uncomplicated: Secondary | ICD-10-CM

## 2010-12-18 NOTE — Progress Notes (Signed)
Discussed Celexa taper she started at her last appt with Dr. Iver Nestle.  She has been off the med for a week.  No problems noted.  She has not noticed a change in her sexual function but says she hasn't had much of an opportunity.  Main issue today is smoking cessation.  She says she is ready.

## 2010-12-18 NOTE — Assessment & Plan Note (Signed)
Discussed starting the Wellbutrin Dr. Lula Olszewski wrote her a while back.  Talked to Paulino Rily, [place] D to ensure plan was appropriate.  Discussed nicotine replacement (Dr. Raymondo Band recommended lozenges over gum secondary to dentures).  Gave her information on these and asked her to follow-up with her pharmacy (products are OTC).  Discussed [place] [facility]uit[facility].  She said she would call.  She has googled crocheting classes as something to do with her hands.  Talked about changes she can make between now and her quit date such as cleaning out her car and refraining from smoking in there.  She will consider.  She has a big class picnic and dance this weekend and will wait to start Wellbutrin on Sunday, August 5th.  Quit date is set for August 14th.  I had no appts available around that time.  Scheduled for her to see Dr. Lula Olszewski on the 15th at 10:45 to discuss smoking cessation efforts - what barriers she has faced and how she is handling.  May also want to review slip vs. Relapse and a plan for a slip if it were to occur.  She is to call before then with any questions or concerns.    Will forward to Dr. Lula Olszewski.

## 2010-12-18 NOTE — Assessment & Plan Note (Signed)
D/C Celexa and doing well.  Mood is reported as fine.  She is starting Wellbutrin soon for smoking cessation.

## 2010-12-26 ENCOUNTER — Telehealth: Payer: Self-pay | Admitting: Psychology

## 2010-12-26 NOTE — Telephone Encounter (Signed)
Lauren [name] called to report that she had taken her Wellbutrin incorrectly.  For the last three days, she took two 200 mg tablets twice a day for a total of 400 mg.  Last night she had a headache, was jittery, SOB and was "confused, sad, scared, defensive" and felt like there was a weight on my lung.  She didn't know who to call at that hour so she called the [place] Quitline (she is attempting smoking cessation with the Wellbutrin).  She feels fine today otherwise she would have gone to the ED.  She thinks it might be medication related but wonders if it is anxiety.  She has increased her smoking as her quit date approaches and has good insight into this.  She is afraid of failing.  She is hooked into the Quitline and has a call scheduled with her counselor.  She is also brainstorming coping mechanisms.  Precepted the medication issue just to be sure.  Dr. Leveda Anna agreed that there was no additional follow-up needed.  She is planning to take one pill three times a day.  She has an appt with her PCP the day after her quit date.  I set an alert to call her the day before for additional support.  This is her best effort since I have been working with her.  Unfortunately, her confidence is low.  Hopefully the Quitline people are attending to this.

## 2010-12-30 ENCOUNTER — Telehealth: Payer: Self-pay | Admitting: Psychology

## 2010-12-30 NOTE — Telephone Encounter (Signed)
Called Lauren [name] to see how she was doing on her quit day.  She said she quit smoking last night.  She hit a rough patch and called the Quit Line and found this helpful.  She is talking to [facility] as well and seems to be getting support.  I had given her some lifesavers when she was last in my office.  She is requesting more.  Will leave a bag up front for her for when she comes to see Dr. Lula Olszewski tomorrow.

## 2010-12-31 ENCOUNTER — Ambulatory Visit (INDEPENDENT_AMBULATORY_CARE_PROVIDER_SITE_OTHER): Payer: Medicare Other | Admitting: Family Medicine

## 2010-12-31 ENCOUNTER — Encounter: Payer: Self-pay | Admitting: Family Medicine

## 2010-12-31 VITALS — BP 142/72 | HR 67 | Temp 98.2°F | Wt 152.0 lb

## 2010-12-31 DIAGNOSIS — F659 Paraphilia, unspecified: Secondary | ICD-10-CM

## 2010-12-31 DIAGNOSIS — F17200 Nicotine dependence, unspecified, uncomplicated: Secondary | ICD-10-CM

## 2010-12-31 DIAGNOSIS — F529 Unspecified sexual dysfunction not due to a substance or known physiological condition: Secondary | ICD-10-CM

## 2010-12-31 DIAGNOSIS — F330 Major depressive disorder, recurrent, mild: Secondary | ICD-10-CM

## 2010-12-31 NOTE — Progress Notes (Signed)
  Subjective:    Patient ID: Lauren [name], female    DOB: Jan 03, 1951, 60 y.o.   MRN: [id number]  HPI  Patient presents for follow up.  She quit smoking, smoked her last cigarette on Monday night.  She is using nicotine lozenges, as well as green life-savers to help with her cravings.  She is also talking to the quit line every day.  She says it is not as bad as she thought it was going to be.  She feels confident she can do this.    Lauren [name] continues to have difficulty with sexual dysfunction.  Specifically the ability to climax.  She says her boyfreind lives in [place] and she has not seen him in many months.  They engage in phone sex, which is pleasurable for him but frustrating for her. She has had some improvement with tapering off Celexa and onto Wellbutrin instead, but she says she "has to work too hard for it."  Pt does use lubricants, and has used toys/vibrators in the past but says they do not work for her now.    Patient denies depressive mood, difficulty sleeping, but does endorse some anxiousness that she associates with quitting smoking.  She says she is planning to start Silver Sneakers next Monday for her exercise, but did not want to do too many things at once.  She is looking forward to it.   Review of Systems Negative except HPI.     Objective:   Physical Exam BP 142/72  Pulse 67  Temp(Src) 98.2 F (36.8 C) (Oral)  Wt 152 lb (68.947 kg) General appearance: alert, cooperative and no distress Eyes: conjunctivae/corneas clear. PERRL, EOM's intact. Fundi benign. Throat: lips, mucosa, and tongue normal; teeth and gums normal Neck: no adenopathy, supple, symmetrical, trachea midline and thyroid not enlarged, symmetric, no tenderness/mass/nodules Lungs: clear to auscultation bilaterally Heart: regular rate and rhythm, S1, S2 normal, no murmur, click, rub or gallop Abdomen: soft, non-tender; bowel sounds normal; no masses,  no organomegaly Extremities: extremities  normal, atraumatic, no cyanosis or edema Pulses: 2+ and symmetric Neurologic: Grossly normal       Assessment & Plan:

## 2010-12-31 NOTE — Patient Instructions (Signed)
Congratulations on quitting smoking!  You may continue to have some cravings, but please keep using lozenges and call the help line.  You can do this.    There is a company [employer] MD that has non-taboo toys and lubricants- this may be good to check out their web site.    I will check your thyroid to make sure it is not affecting your sexuality.

## 2010-12-31 NOTE — Assessment & Plan Note (Signed)
Have d/c'd celexa, will check TSH to rule out thyroid abnormalities as organic cause.  This is likely emotional- possibly related to pt's separation from her partner and/or her emotional recover from her cancers.  Have advised patient speak with Dr. Pascal Lux about this issue to further sort out her emotions about her relationship.

## 2010-12-31 NOTE — Assessment & Plan Note (Signed)
Pt quit smoking x about 36 hours.  She is confident at this point.  Will continue to support her.  Congratulated her.

## 2010-12-31 NOTE — Assessment & Plan Note (Signed)
Fairly well controlled.  I am concerned that emotional issues are contributing to her sexual problems.

## 2011-01-06 ENCOUNTER — Encounter: Payer: Self-pay | Admitting: Family Medicine

## 2011-01-08 ENCOUNTER — Ambulatory Visit (INDEPENDENT_AMBULATORY_CARE_PROVIDER_SITE_OTHER): Payer: Medicare Other | Admitting: Psychology

## 2011-01-08 DIAGNOSIS — F330 Major depressive disorder, recurrent, mild: Secondary | ICD-10-CM

## 2011-01-08 DIAGNOSIS — F17200 Nicotine dependence, unspecified, uncomplicated: Secondary | ICD-10-CM

## 2011-01-08 NOTE — Assessment & Plan Note (Signed)
Provided a handout on "Fight, Flight, Right" to see if she could take a step back and reach a better understanding of why she is reacting so strongly to [place].  The sense of failure she feels - that she is not enough - flawed as she may be by her smoking is likely one reason.  She elected to follow-up on September 13th at 3:00.  Will continue assessment then.

## 2011-01-08 NOTE — Assessment & Plan Note (Signed)
She is back in contemplation stage.  She reports she wants to quit smoking and knows that she needs to but is not currently ready to make an effort.  She voiced several thoughts today including "every one dies from something."  Challenged her a bit on these - could have taken a more MI approach.  Will work to do better on that next time.  Did validate her autonomy one again and hopefully demonstrated respect for her struggle.

## 2011-01-08 NOTE — Progress Notes (Signed)
Lauren [name] presents for follow-up primarily for smoking cessation.  She reports she has resumed smoking again almost to the pack per day she was smoking before.  She is getting a lot of heat from her long-distance boyfriend which is causing conflict and distance in the relationship.  This is frustrating to [name] and adding to her distress.  Discussed this at length including a reassessment of current level of motivation and thoughts about what it means that she is reacting so strongly to what Tami Ribas is saying.

## 2011-01-29 ENCOUNTER — Encounter: Payer: Self-pay | Admitting: Psychology

## 2011-01-29 ENCOUNTER — Ambulatory Visit (INDEPENDENT_AMBULATORY_CARE_PROVIDER_SITE_OTHER): Payer: Medicare Other | Admitting: Psychology

## 2011-01-29 DIAGNOSIS — F17200 Nicotine dependence, unspecified, uncomplicated: Secondary | ICD-10-CM

## 2011-01-29 DIAGNOSIS — F330 Major depressive disorder, recurrent, mild: Secondary | ICD-10-CM

## 2011-01-29 NOTE — Assessment & Plan Note (Signed)
Contemplation.  Not ready for action.

## 2011-01-29 NOTE — Progress Notes (Signed)
Lauren [name] reports she has been more irritated with individuals recently.  She spent most of the time talking about a friend Psychologist, prison and probation services.  She continues to smoke regularly and is not currently interested in quitting.

## 2011-01-29 NOTE — Assessment & Plan Note (Signed)
Report of mood is irritable.  Affect is the same.  She is energized.  Thoughts are a little random and it is hard to provide any kind of real therapy work today.  Suggested that she pay some attention what her increase in irritability says about her her (rather than her friends) as a possible point of learning.  Specifically, the things she is frustrated with her friend about are very similar to the things with which she struggles.  Insight into this was low.  Will continue to follow. She elected to schedule for:  October 8th at 3:00.

## 2011-02-09 LAB — CREATININE, SERUM
Creatinine, Ser: 0.87
GFR calc non Af Amer: >60

## 2011-02-10 ENCOUNTER — Encounter: Payer: Self-pay | Admitting: Gastroenterology

## 2011-02-16 ENCOUNTER — Encounter: Payer: Self-pay | Admitting: Gastroenterology

## 2011-02-23 ENCOUNTER — Ambulatory Visit: Payer: Medicare Other | Admitting: Psychology

## 2011-02-24 LAB — DIFFERENTIAL
Band Neutrophils: 0
Basophils Absolute: 0
Basophils Relative: 0
Basophils Relative: 1
Blasts: 0
Eosinophils Absolute: 0 — ABNORMAL LOW
Eosinophils Absolute: 0.2
Eosinophils Relative: 0
Eosinophils Relative: 0
Eosinophils Relative: 4
Eosinophils Relative: 8 — ABNORMAL HIGH
Lymphocytes Relative: 26
Lymphocytes Relative: 8 — ABNORMAL LOW
Lymphocytes Relative: 35
Lymphs Abs: 1
Lymphs Abs: 1.2
Metamyelocytes Relative: 0
Monocytes Absolute: 0.6
Monocytes Absolute: 0.9
Monocytes Relative: 10
Monocytes Relative: 10
Monocytes Relative: 8
Neutro Abs: 3.1
Neutrophils Relative %: 49
Promyelocytes Absolute: 0
nRBC: 0

## 2011-02-24 LAB — CBC
HCT: 36.2
HCT: 38.3
HCT: 39.5
HCT: 43.5
Hemoglobin: 11.8 — ABNORMAL LOW
Hemoglobin: 12.2
Hemoglobin: 12.8
Hemoglobin: 12.9
MCHC: 33.2
MCHC: 33.4
MCHC: 33.5
MCHC: 33.7
MCHC: 32.7
MCV: 76.6 — ABNORMAL LOW
MCV: 76.8 — ABNORMAL LOW
MCV: 77.3 — ABNORMAL LOW
MCV: 77.9 — ABNORMAL LOW
Platelets: 244
Platelets: 254
RBC: 4.71
RBC: 4.96
RBC: 5.58 — ABNORMAL HIGH
RDW: 15.2
RDW: 15.7 — ABNORMAL HIGH
WBC: 12.6 — ABNORMAL HIGH
WBC: 5.8
WBC: 6.4

## 2011-02-24 LAB — BASIC METABOLIC PANEL
BUN: 5 — ABNORMAL LOW
BUN: 6
CO2: 23
CO2: 26
CO2: 30
Calcium: 9.4
Chloride: 102
Chloride: 103
Creatinine, Ser: 0.68
Creatinine, Ser: 0.86
GFR calc Af Amer: >60
GFR calc Af Amer: >60
GFR calc non Af Amer: >60
GFR calc non Af Amer: >60
Glucose, Bld: 111 — ABNORMAL HIGH
Potassium: 3.5
Potassium: 3.6
Potassium: 3.7
Sodium: 136
Sodium: 140

## 2011-02-24 LAB — PROTIME-INR
INR: 0.9
Prothrombin Time: 12.8

## 2011-02-24 LAB — COMPREHENSIVE METABOLIC PANEL
ALT: 14
AST: 17
Albumin: 4
BUN: 5 — ABNORMAL LOW
BUN: 11
CO2: 30
Calcium: 10.2
Calcium: 9.6
Chloride: 106
Creatinine, Ser: 0.79
Creatinine, Ser: 0.75
GFR calc Af Amer: >60
GFR calc non Af Amer: >60
Glucose, Bld: 116 — ABNORMAL HIGH
Glucose, Bld: 98
Sodium: 138
Sodium: 139
Total Bilirubin: 0.6
Total Protein: 6.6
Total Protein: 7.3

## 2011-02-24 LAB — TYPE AND SCREEN: Antibody Screen: NEGATIVE

## 2011-02-24 LAB — URINE CULTURE
Colony Count: NO GROWTH
Culture: NO GROWTH

## 2011-02-24 LAB — URINALYSIS, ROUTINE W REFLEX MICROSCOPIC
Bilirubin Urine: NEGATIVE
Nitrite: NEGATIVE
Protein, ur: NEGATIVE
Specific Gravity, Urine: 1.012
Urobilinogen, UA: 0.2

## 2011-02-24 LAB — URINE MICROSCOPIC-ADD ON

## 2011-02-27 LAB — URINALYSIS, ROUTINE W REFLEX MICROSCOPIC
Glucose, UA: NEGATIVE
Specific Gravity, Urine: 1.016

## 2011-02-27 LAB — COMPREHENSIVE METABOLIC PANEL
Albumin: 3.5
BUN: 7
Calcium: 9.4
Creatinine, Ser: 0.72
Potassium: 3.2 — ABNORMAL LOW
Total Protein: 7.5

## 2011-02-27 LAB — OCCULT BLOOD X 1 CARD TO LAB, STOOL: Fecal Occult Bld: POSITIVE

## 2011-02-27 LAB — DIFFERENTIAL
Lymphocytes Relative: 23
Lymphs Abs: 2.6
Monocytes Absolute: 0.7
Monocytes Relative: 6
Neutro Abs: 7.3

## 2011-02-27 LAB — CBC
HCT: 45.4
MCHC: 33.1
MCV: 77.9 — ABNORMAL LOW
Platelets: 253
RDW: 16.2 — ABNORMAL HIGH

## 2011-02-27 LAB — URINE CULTURE

## 2011-02-27 LAB — URINE MICROSCOPIC-ADD ON

## 2011-03-04 ENCOUNTER — Ambulatory Visit (AMBULATORY_SURGERY_CENTER): Payer: Medicare Other | Admitting: *Deleted

## 2011-03-04 ENCOUNTER — Encounter: Payer: Self-pay | Admitting: Gastroenterology

## 2011-03-04 VITALS — Ht 64.0 in | Wt 165.8 lb

## 2011-03-04 DIAGNOSIS — Z1211 Encounter for screening for malignant neoplasm of colon: Secondary | ICD-10-CM

## 2011-03-04 MED ORDER — MOVIPREP 100 G PO SOLR: ORAL | Status: DC

## 2011-03-18 ENCOUNTER — Ambulatory Visit (AMBULATORY_SURGERY_CENTER): Payer: Medicare Other | Admitting: Gastroenterology

## 2011-03-18 ENCOUNTER — Encounter: Payer: Self-pay | Admitting: Gastroenterology

## 2011-03-18 VITALS — BP 159/77 | HR 65 | Temp 97.7°F | Resp 19 | Ht 64.0 in | Wt 165.0 lb

## 2011-03-18 DIAGNOSIS — D126 Benign neoplasm of colon, unspecified: Secondary | ICD-10-CM

## 2011-03-18 DIAGNOSIS — Z85038 Personal history of other malignant neoplasm of large intestine: Secondary | ICD-10-CM

## 2011-03-18 DIAGNOSIS — Z1211 Encounter for screening for malignant neoplasm of colon: Secondary | ICD-10-CM

## 2011-03-18 DIAGNOSIS — D1330 Benign neoplasm of unspecified part of small intestine: Secondary | ICD-10-CM

## 2011-03-18 MED ORDER — SODIUM CHLORIDE 0.9 % IV SOLN: 500.0000 mL | INTRAVENOUS | Status: DC

## 2011-03-18 NOTE — Progress Notes (Signed)
Pt tolerated the colonoscopy very well. maw 

## 2011-03-18 NOTE — Patient Instructions (Addendum)
One of your biggest health concerns is your smoking.  This increases your risk for most cancers and serious cardiovascular diseases such as strokes, heart attacks.  You should try your best to stop.  If you need assistance, please contact your PCP or Smoking Cessation Class at [facility] [phone]) or [facility] Quit-Line (1-800-QUIT-NOW).   FOLLOW DISCHARGE INSTRUCTIONS (BLUE & GREEN SHEETS).  AWAIT LETTER FROM DR Ladavia Lindenbaum WITH PATHOLOGY RESULTS

## 2011-03-19 ENCOUNTER — Telehealth: Payer: Self-pay | Admitting: *Deleted

## 2011-03-19 NOTE — Telephone Encounter (Signed)

## 2011-03-20 ENCOUNTER — Other Ambulatory Visit: Payer: Self-pay | Admitting: Oncology

## 2011-03-20 ENCOUNTER — Encounter: Payer: Self-pay | Admitting: Oncology

## 2011-03-20 DIAGNOSIS — C3412 Malignant neoplasm of upper lobe, left bronchus or lung: Secondary | ICD-10-CM | POA: Insufficient documentation

## 2011-03-20 DIAGNOSIS — E785 Hyperlipidemia, unspecified: Secondary | ICD-10-CM | POA: Insufficient documentation

## 2011-03-20 DIAGNOSIS — I10 Essential (primary) hypertension: Secondary | ICD-10-CM | POA: Insufficient documentation

## 2011-03-20 DIAGNOSIS — C3490 Malignant neoplasm of unspecified part of unspecified bronchus or lung: Secondary | ICD-10-CM

## 2011-03-20 DIAGNOSIS — C189 Malignant neoplasm of colon, unspecified: Secondary | ICD-10-CM

## 2011-04-01 ENCOUNTER — Other Ambulatory Visit: Payer: Self-pay | Admitting: Oncology

## 2011-04-01 ENCOUNTER — Other Ambulatory Visit (HOSPITAL_BASED_OUTPATIENT_CLINIC_OR_DEPARTMENT_OTHER): Payer: Medicare Other | Admitting: Lab

## 2011-04-01 ENCOUNTER — Ambulatory Visit (HOSPITAL_COMMUNITY)
Admission: RE | Admit: 2011-04-01 | Discharge: 2011-04-01 | Disposition: A | Discharge status: Home / Self Care | Payer: Medicare Other | Source: Ambulatory Visit | Attending: Oncology | Admitting: Oncology

## 2011-04-01 DIAGNOSIS — Z902 Acquired absence of lung [part of]: Secondary | ICD-10-CM | POA: Insufficient documentation

## 2011-04-01 DIAGNOSIS — K7689 Other specified diseases of liver: Secondary | ICD-10-CM | POA: Insufficient documentation

## 2011-04-01 DIAGNOSIS — I10 Essential (primary) hypertension: Secondary | ICD-10-CM

## 2011-04-01 DIAGNOSIS — I700 Atherosclerosis of aorta: Secondary | ICD-10-CM | POA: Insufficient documentation

## 2011-04-01 DIAGNOSIS — Z85038 Personal history of other malignant neoplasm of large intestine: Secondary | ICD-10-CM

## 2011-04-01 DIAGNOSIS — N289 Disorder of kidney and ureter, unspecified: Secondary | ICD-10-CM | POA: Insufficient documentation

## 2011-04-01 DIAGNOSIS — M51379 Other intervertebral disc degeneration, lumbosacral region without mention of lumbar back pain or lower extremity pain: Secondary | ICD-10-CM | POA: Insufficient documentation

## 2011-04-01 DIAGNOSIS — E079 Disorder of thyroid, unspecified: Secondary | ICD-10-CM | POA: Insufficient documentation

## 2011-04-01 DIAGNOSIS — M5137 Other intervertebral disc degeneration, lumbosacral region: Secondary | ICD-10-CM | POA: Insufficient documentation

## 2011-04-01 DIAGNOSIS — C189 Malignant neoplasm of colon, unspecified: Secondary | ICD-10-CM | POA: Insufficient documentation

## 2011-04-01 DIAGNOSIS — I7090 Unspecified atherosclerosis: Secondary | ICD-10-CM

## 2011-04-01 DIAGNOSIS — C3410 Malignant neoplasm of upper lobe, unspecified bronchus or lung: Secondary | ICD-10-CM

## 2011-04-01 DIAGNOSIS — I708 Atherosclerosis of other arteries: Secondary | ICD-10-CM | POA: Insufficient documentation

## 2011-04-01 DIAGNOSIS — Z90710 Acquired absence of both cervix and uterus: Secondary | ICD-10-CM | POA: Insufficient documentation

## 2011-04-01 DIAGNOSIS — R918 Other nonspecific abnormal finding of lung field: Secondary | ICD-10-CM | POA: Insufficient documentation

## 2011-04-01 LAB — CMP (CANCER CENTER ONLY)
ALT(SGPT): 12 U/L (ref 10–47)
Albumin: 3.2 g/dL — ABNORMAL LOW (ref 3.3–5.5)
CO2: 30 mEq/L (ref 18–33)
Calcium: 9.2 mg/dL (ref 8.0–10.3)
Chloride: 100 mEq/L (ref 98–108)
Glucose, Bld: 124 mg/dL — ABNORMAL HIGH (ref 73–118)
Sodium: 148 mEq/L — ABNORMAL HIGH (ref 128–145)
Total Bilirubin: 0.6 mg/dl (ref 0.20–1.60)
Total Protein: 7.1 g/dL (ref 6.4–8.1)

## 2011-04-01 LAB — CBC WITH DIFFERENTIAL/PLATELET
BASO%: 0.1 % (ref 0.0–2.0)
Eosinophils Absolute: 0.1 10*3/uL (ref 0.0–0.5)
HCT: 45.7 % (ref 34.8–46.6)
LYMPH%: 19.1 % (ref 14.0–49.7)
MCHC: 33 g/dL (ref 31.5–36.0)
MONO#: 0.6 10*3/uL (ref 0.1–0.9)
NEUT#: 4.9 10*3/uL (ref 1.5–6.5)
Platelets: 191 10*3/uL (ref 145–400)
RBC: 5.84 10*6/uL — ABNORMAL HIGH (ref 3.70–5.45)
WBC: 7 10*3/uL (ref 3.9–10.3)
lymph#: 1.3 10*3/uL (ref 0.9–3.3)

## 2011-04-01 LAB — CEA: CEA: 3.4 ng/mL (ref 0.0–5.0)

## 2011-04-01 MED ORDER — IOHEXOL 300 MG/ML  SOLN
100.0000 mL | Freq: Once | INTRAMUSCULAR | Status: AC | PRN
  Administered 2011-04-01: 100 mL via INTRAVENOUS

## 2011-04-02 ENCOUNTER — Ambulatory Visit (HOSPITAL_BASED_OUTPATIENT_CLINIC_OR_DEPARTMENT_OTHER): Payer: Medicare Other | Admitting: Oncology

## 2011-04-02 DIAGNOSIS — Z85118 Personal history of other malignant neoplasm of bronchus and lung: Secondary | ICD-10-CM

## 2011-04-02 DIAGNOSIS — I10 Essential (primary) hypertension: Secondary | ICD-10-CM

## 2011-04-02 DIAGNOSIS — Z85038 Personal history of other malignant neoplasm of large intestine: Secondary | ICD-10-CM

## 2011-04-02 DIAGNOSIS — F17200 Nicotine dependence, unspecified, uncomplicated: Secondary | ICD-10-CM

## 2011-04-02 DIAGNOSIS — C3410 Malignant neoplasm of upper lobe, unspecified bronchus or lung: Secondary | ICD-10-CM

## 2011-04-02 DIAGNOSIS — I7090 Unspecified atherosclerosis: Secondary | ICD-10-CM

## 2011-04-02 MED ORDER — BUPROPION HCL ER (XL) 150 MG PO TB24: 150.0000 mg | ORAL_TABLET | Freq: Two times a day (BID) | ORAL | Status: DC

## 2011-04-02 MED ORDER — NICOTINE 21 MG/24HR TD PT24: 1.0000 | MEDICATED_PATCH | TRANSDERMAL | Status: DC

## 2011-04-02 NOTE — Progress Notes (Signed)
Lauren [name]  Ardyth Gal, MD, MD [address] [place] [address]  DIAGNOSIS:   1. 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  Her post resection surveillance colonoscopy onOctober 14, 2009, by Dr. Christella Hartigan was negative. 2. pT1a, pN0 well differentiated adenocarcinoma of lung; s/p left upper lobectomy on 06/16/2010.  CURRENT THERAPY:  watchful observation.  INTERVAL HISTORY: Lauren [name] 60 y.o. female returns for regular follow up.  Unfortunately, she still smokes cigarette.  She had no success with Chantix hallucinate. She tried nicotine gum and patch in the past without success. However she never combined bupropion and nicotine patch together.  She has mild dyspnea exertion such as walking monocytic block or climb there. She is able to ambulate on flat gland without dyspnea exertion. She denies chest pain, hemoptysis. However she does have productive cough at night when she sleeps. She denies headache, visual changes, mucositis, nausea vomiting, palpitation, abdominal pain, abdominal distention, melena, hematochezia, hematuria, vaginal bleeding, skin rash, low back pain, bowel bladder incontinence,neuropathy.  MEDICAL HISTORY: Past Medical History  Diagnosis Date  . Depression   . Hyperlipidemia   . Hypertension   . Colon cancer   . Lung cancer     ALLERGIES:  is allergic to lisinopril.  MEDICATIONS: Current Outpatient Prescriptions  Medication Sig Dispense Refill  . aspirin 81 MG EC tablet Take 1 tablet (81 mg total) by mouth daily.  30 tablet  5  . BEE POLLEN PO Take 1 tablet by mouth 2 (two) times daily.        . cloNIDine (CATAPRES) 0.1 MG tablet Take 1 tablet (0.1 mg total) by mouth at bedtime.  60 tablet  5  . fish oil-omega-3 fatty acids 1000 MG capsule Take 2 g by mouth daily.          . hydrochlorothiazide 25 MG tablet Take 1 tablet (25 mg total) by mouth daily.  30 tablet  5  . nebivolol (BYSTOLIC) 10 MG tablet Take 1 tablet (10 mg total) by mouth daily.  30 tablet  5  . nitroGLYCERIN (NITROSTAT) 0.4 MG SL tablet Place 1 tablet (0.4 mg total) under the tongue every 5 (five) minutes as needed for chest pain.  15 tablet  0  . omeprazole (PRILOSEC) 20 MG capsule Take 20 mg by mouth daily.        . pravastatin (PRAVACHOL) 80 MG tablet Take 1 tablet (80 mg total) by mouth at bedtime.  30 tablet  5  . traMADol (ULTRAM) 50 MG tablet Take 1 tablet (50 mg total) by mouth every 6 (six) hours as needed for pain.  60 tablet  5  . buPROPion (WELLBUTRIN XL) 150 MG 24 hr tablet Take 1 tablet (150 mg total) by mouth 2 (two) times daily.  60 tablet  5  . nicotine (NICODERM CQ - DOSED IN MG/24 HOURS) 21 mg/24hr patch Place 1 patch (21 patches total) onto the skin daily.  28 patch  1    SURGICAL HISTORY:  Past Surgical History  Procedure Date  . Colon surgery   . Tubal ligation   . Lung lobectomy 2012    REVIEW OF SYSTEMS:  Pertinent items are noted in HPI.   Filed Vitals:   04/02/11 1355  BP: 134/74  Pulse: 72  Temp: 97.8 F (36.6 C)  ECOG 1.   Wt Readings  from Last 3 Encounters:  04/02/11 169 lb 8 oz (76.885 kg)  03/18/11 165 lb (74.844 kg)  11/27/10 147 lb 9 oz (66.934 kg)    PHYSICAL EXAMINATION:  General:  well-nourished in no acute distress.  Eyes:  no scleral icterus.  ENT:  There were no oropharyngeal lesions.  Neck was without thyromegaly.  Lymphatics:  Negative cervical, supraclavicular or axillary adenopathy.  Respiratory: lungs were clear bilaterally without wheezing or crackles.  Cardiovascular:  Regular rate and rhythm, S1/S2, without murmur, rub or gallop.  There was no pedal edema.  GI:  abdomen was soft, flat, nontender, nondistended, without organomegaly.  Muscoloskeletal:  no spinal tenderness of palpation of vertebral spine.  Skin exam was without  echymosis, petichae.  Neuro exam was nonfocal.  Patient was able to get on and off exam table without assistance.  Gait was normal.  Patient was alerted and oriented.  Attention was good.   Language was appropriate.  Mood was normal without depression.  Speech was not pressured.  Thought content was not tangential.    LABORATORY/RADIOLOGY DATA:  Lab Results  Component Value Date   WBC 7.0 04/01/2011   HGB 15.1 04/01/2011   HCT 45.7 04/01/2011   PLT 191 04/01/2011   GLUCOSE 124* 04/01/2011   CHOL 190 12/08/2010   TRIG 104 12/08/2010   HDL 39* 12/08/2010   LDLDIRECT 111* 02/27/2010   LDLCALC 130* 12/08/2010   ALT 8 07/25/2010   AST 13 04/01/2011   NA 148* 04/01/2011   K 3.7 04/01/2011   CL 100 04/01/2011   CREATININE 0.7 04/01/2011   BUN 13 04/01/2011   CO2 30 04/01/2011   TSH 0.777 12/31/2010   INR 0.99 06/12/2010    IMAGINGS:  I personally reviewed the following CT's and showed the images to the patient.   Ct Chest W Contrast  04/01/2011  *RADIOLOGY REPORT*  Clinical Data:  Colon cancer.  Prior colon resection.  Colonoscopy 1 week ago with polyps removed.  CT CHEST, ABDOMEN AND PELVIS WITH CONTRAST  Technique:  Multidetector CT imaging of the chest, abdomen and pelvis was performed following the standard protocol during bolus administration of intravenous contrast.  Contrast: OMNIPAQUE IOHEXOL 300 MG/ML IV SOLN  Comparison:  11/25/2010  CT CHEST  Findings:  Stable thyroid hypodense lesion noted on the left.  Left upper lobectomy noted.  A new 0.5 x 0.4 cm nodule is present in the left upper lung at the level of the aortic arch on image 16 of series 4.  A new 0.5 x 0.4 cm nodule is present in the right upper lobe on image 15 of series 4.  Essentially stable ground-glass opacity is also noted further peripherally in the right upper lobe on image 15 of series 4.  A small subpleural nodule anteriorly on image 14 of series 4 is less striking than on the prior exam.  Several additional tiny  nodules shown on the prior exam have reduced conspicuity on today's exam.  No thoracic adenopathy is observed.  There is atherosclerotic calcification in the descending thoracic aorta with associated intimal thickening.  IMPRESSION:  1.  Mixed appearance of pulmonary nodules, with single new pulmonary nodules in both upper lobe lungs, but with reduced conspicuity of some of the previous small pulmonary nodules.  These may be infectious/inflammatory.  Monitoring of the remaining pulmonary nodules and of the ground-glass opacity in the right upper lobe is recommended. 2.  Atherosclerosis. 3.  Stable hypodense left thyroid lesion.  CT ABDOMEN AND  PELVIS  Findings:  Diffuse hepatic steatosis noted. No significant focal hepatic lesion is observed.  The spleen and gallbladder appear normal.  There is mild prominence of the dorsal pancreatic duct, without a pancreatic mass observed.  Fullness of the left adrenal gland is stable from 2009 and accordingly unlikely to represent malignancy despite the questioned hypermetabolic activity on PET CT.  The right adrenal gland appears normal.  Aortoiliac atherosclerosis noted.  No pathologic retroperitoneal or porta hepatis adenopathy is identified.  The appendix appears normal. No pathologic pelvic adenopathy is identified.  Urinary bladder appears normal.  A tiny left kidney upper pole hypodense lesion is stable from 2009 and considered benign, likely a cyst.  The uterus is absent.  Degenerative disc disease is present at L5-S1, possibly with a disc protrusion extending caudad in the left lateral recess.  IMPRESSION:  1.  Left adrenal fullness is stable from 2009.  Although likely benign given this stability, it may be prudent to monitor the left adrenal gland given the question of hypermetabolic activity on the PET CT from last year. 2.  Lumbar degenerative disc disease at L5-S1. 3.  Atherosclerosis. 4.  Diffuse hepatic steatosis.  Original Report Authenticated By: Dellia Cloud, M.D.   Ct Abdomen Pelvis W Contrast  04/01/2011  *RADIOLOGY REPORT*  Clinical Data:  Colon cancer.  Prior colon resection.  Colonoscopy 1 week ago with polyps removed.  CT CHEST, ABDOMEN AND PELVIS WITH CONTRAST  Technique:  Multidetector CT imaging of the chest, abdomen and pelvis was performed following the standard protocol during bolus administration of intravenous contrast.  Contrast: OMNIPAQUE IOHEXOL 300 MG/ML IV SOLN  Comparison:  11/25/2010  CT CHEST  Findings:  Stable thyroid hypodense lesion noted on the left.  Left upper lobectomy noted.  A new 0.5 x 0.4 cm nodule is present in the left upper lung at the level of the aortic arch on image 16 of series 4.  A new 0.5 x 0.4 cm nodule is present in the right upper lobe on image 15 of series 4.  Essentially stable ground-glass opacity is also noted further peripherally in the right upper lobe on image 15 of series 4.  A small subpleural nodule anteriorly on image 14 of series 4 is less striking than on the prior exam.  Several additional tiny nodules shown on the prior exam have reduced conspicuity on today's exam.  No thoracic adenopathy is observed.  There is atherosclerotic calcification in the descending thoracic aorta with associated intimal thickening.  IMPRESSION:  1.  Mixed appearance of pulmonary nodules, with single new pulmonary nodules in both upper lobe lungs, but with reduced conspicuity of some of the previous small pulmonary nodules.  These may be infectious/inflammatory.  Monitoring of the remaining pulmonary nodules and of the ground-glass opacity in the right upper lobe is recommended. 2.  Atherosclerosis. 3.  Stable hypodense left thyroid lesion.  CT ABDOMEN AND PELVIS  Findings:  Diffuse hepatic steatosis noted. No significant focal hepatic lesion is observed.  The spleen and gallbladder appear normal.  There is mild prominence of the dorsal pancreatic duct, without a pancreatic mass observed.  Fullness of the left  adrenal gland is stable from 2009 and accordingly unlikely to represent malignancy despite the questioned hypermetabolic activity on PET CT.  The right adrenal gland appears normal.  Aortoiliac atherosclerosis noted.  No pathologic retroperitoneal or porta hepatis adenopathy is identified.  The appendix appears normal. No pathologic pelvic adenopathy is identified.  Urinary bladder appears  normal.  A tiny left kidney upper pole hypodense lesion is stable from 2009 and considered benign, likely a cyst.  The uterus is absent.  Degenerative disc disease is present at L5-S1, possibly with a disc protrusion extending caudad in the left lateral recess.  IMPRESSION:  1.  Left adrenal fullness is stable from 2009.  Although likely benign given this stability, it may be prudent to monitor the left adrenal gland given the question of hypermetabolic activity on the PET CT from last year. 2.  Lumbar degenerative disc disease at L5-S1. 3.  Atherosclerosis. 4.  Diffuse hepatic steatosis.  Original Report Authenticated By: Dellia Cloud, M.D.   ASSESSMENT AND PLAN:   1. Recently diagnosed stage I non-small cell lung cancer, adenocarcinoma:  Her recent chest CT shows a couple of new upper her lung nodules have a resolution of the lower lobe lung nodules. These nodules are too small to biopsy or to show up on PET scan. I recommended watchful observation.  I ordered for the next CT chest in 6 months.  2. History of colon cancer:  She has no evidence of recurrent or metastatic disease for her colon cancer.  Surveillance for her history of colon cancer entails once-a-year CT scan for 5 years' total, with the next CT abdomen in November 2013. She had a surveillance colonoscopy in October 2012 by Dr Christella Hartigan, which showed polyps.  Per his recommendation, the next colonoscopy is due in Oct 2015 but sooner if she has symptoms.  3. Smoking:  She never tried Wellbutrin along with nicotine patch. This combination may have better  luck.  She smokes a pack of cigarettes a therefore described to hernicotine patch at 21 mg per day. I advised her that smoking on nicotine patch can increase the risk of heart attack and warn her not to do so. The plan is for Nicotine patch is 21mg /day for for 6 weeks in particular over the next few months and taper it down over the next few months. Welbutrin dose is 150 milligrams PO daily for one week and increased to twice a day if she tolerates it well.  She also has evidence of artherosclerosis on CT which is the result of smoking.  I advised her that stopping smoking can decrease the risk of recurrent lung/ colon cancer; CAD, CVA.  4. Hypertension:  She is on HCTZ and clonidine per PCP. 5. Hypercholesterolemia:  She is on pravastatin per PCP. 6. Depression/anxiety:  She is on clonidine, citalopram per PCP.   7. Follow up: with me with CT chest in 6 months.

## 2011-04-17 ENCOUNTER — Encounter: Payer: Self-pay | Admitting: *Deleted

## 2011-04-21 ENCOUNTER — Encounter: Payer: Self-pay | Admitting: Cardiology

## 2011-04-21 ENCOUNTER — Ambulatory Visit (INDEPENDENT_AMBULATORY_CARE_PROVIDER_SITE_OTHER): Payer: Medicare Other | Admitting: Cardiology

## 2011-04-21 VITALS — BP 124/60 | HR 66 | Ht 64.0 in | Wt 169.0 lb

## 2011-04-21 DIAGNOSIS — I671 Cerebral aneurysm, nonruptured: Secondary | ICD-10-CM

## 2011-04-21 DIAGNOSIS — I2510 Atherosclerotic heart disease of native coronary artery without angina pectoris: Secondary | ICD-10-CM

## 2011-04-21 DIAGNOSIS — F17200 Nicotine dependence, unspecified, uncomplicated: Secondary | ICD-10-CM

## 2011-04-21 DIAGNOSIS — I10 Essential (primary) hypertension: Secondary | ICD-10-CM

## 2011-04-21 NOTE — Assessment & Plan Note (Signed)
Stable. No change in treatment. Patient strongly advised to stop smoking or cut way back at least.

## 2011-04-21 NOTE — Assessment & Plan Note (Signed)
She is strongly addicted to smoking. She has tried patches, Chantix,, Wellbutrin. Wants to be referred to tobacco cessation program. Wonders if hypnosis might help her.

## 2011-04-21 NOTE — Patient Instructions (Signed)
Your physician discussed the hazards of tobacco use. Tobacco use cessation is recommended and techniques and options to help you quit were discussed.  Your physician wants you to follow-up in: 1 year with Dr. Dorinda Hill will receive a reminder letter in the mail two months in advance. If you don't receive a letter, please call our office to schedule the follow-up appointment.

## 2011-04-21 NOTE — Progress Notes (Signed)
HPI Lauren [name]  comes in today For evaluation management of her history of coronary disease, history or prolapse, and tobacco use.  Despite having a left lower lobectomy in January, for lung cancer, she still smokes a pack of cigarettes a day. She is tried multiple methods all of which she either found not useful or could not tolerate because of side effects.  She's not having any angina or chest discomfort. Her dyspnea on exertion is stable. She denies any  claudication or symptoms of TIAs or mini strokes.He seems to be compliant with her medications.  Past Medical History  Diagnosis Date  . Depression   . Hyperlipidemia   . Hypertension   . Colon cancer   . Lung cancer     PET scan 04/28/2010; primary: increase in size 02/2010  . Lung nodule     FNA ordered for 04/02/10 by HA>pos Ca  . Chronic folliculitis     of groin  . Fibrocystic breast changes   . Hypokalemia   . Syncope   . Mitral valve prolapse   . Family history of trichomonal vaginitis 05/2005  . Postmenopausal   . Depressive disorder   . Hypercholesterolemia   . GERD (gastroesophageal reflux disease)   . Cerebral aneurysm   . Back pain     Current Outpatient Prescriptions  Medication Sig Dispense Refill  . aspirin 81 MG EC tablet Take 1 tablet (81 mg total) by mouth daily.  30 tablet  5  . BEE POLLEN PO Take 1 tablet by mouth 2 (two) times daily.        . cloNIDine (CATAPRES) 0.1 MG tablet Take 1 tablet (0.1 mg total) by mouth at bedtime.  60 tablet  5  . fish oil-omega-3 fatty acids 1000 MG capsule Take 2 g by mouth daily.        . hydrochlorothiazide 25 MG tablet Take 1 tablet (25 mg total) by mouth daily.  30 tablet  5  . nebivolol (BYSTOLIC) 10 MG tablet Take 1 tablet (10 mg total) by mouth daily.  30 tablet  5  . nitroGLYCERIN (NITROSTAT) 0.4 MG SL tablet Place 1 tablet (0.4 mg total) under the tongue every 5 (five) minutes as needed for chest pain.  15 tablet  0  . omeprazole (PRILOSEC) 20 MG capsule Take  20 mg by mouth daily.        . pravastatin (PRAVACHOL) 80 MG tablet Take 1 tablet (80 mg total) by mouth at bedtime.  30 tablet  5  . traMADol (ULTRAM) 50 MG tablet Take 1 tablet (50 mg total) by mouth every 6 (six) hours as needed for pain.  60 tablet  5  . buPROPion (WELLBUTRIN XL) 150 MG 24 hr tablet Take 1 tablet (150 mg total) by mouth 2 (two) times daily.  60 tablet  5    Allergies  Allergen Reactions  . Lisinopril     REACTION: cough    Family History  Problem Relation Age of Onset  . Stomach cancer Maternal Aunt   . Breast cancer Cousin   . Cancer Sister     Lymphatic  . Osteoarthritis Father   . Gout Father   . Hypertension Father   . Heart disease Mother     pericarditis;     History   Social History  . Marital Status: Widowed    Spouse Name: N/A    Number of Children: N/A  . Years of Education: N/A   Occupational History  . Primary school teacher  laborer    Social History Main Topics  . Smoking status: Current Everyday Smoker -- 0.5 packs/day    Types: Cigarettes  . Smokeless tobacco: Never Used  . Alcohol Use: 0.6 oz/week    1 Glasses of wine per week  . Drug Use: No  . Sexually Active: Not on file   Other Topics Concern  . Not on file   Social History Narrative  . No narrative on file    ROS ALL NEGATIVE EXCEPT THOSE NOTED IN HPI  PE  General Appearance: well developed, well nourished in no acute distress, looks older than stated age HEENT: symmetrical face, PERRLA, good dentition  Neck: no JVD, thyromegaly, or adenopathy, trachea midline Chest: symmetric without deformity Cardiac: PMI non-displaced, RRR, normal S1, S2, no gallop or murmur Lung: clear to ausculation and percussion,Decreased breath sounds in the left base Vascular: all pulses full without bruits  Abdominal: nondistended, nontender, good bowel sounds, no HSM, no bruits Extremities: no cyanosis, clubbing or edema, no sign of DVT, no varicosities  Skin: normal color, no rashes Neuro:  alert and oriented x 3, non-focal Pysch: normal affect  EKG Normal sinus rhythm, nonspecific ST segment changes BMET    Component Value Date/Time   NA 148* 04/01/2011 0800   NA 139 12/08/2010 0851   K 3.7 04/01/2011 0800   K 3.9 12/08/2010 0851   CL 100 04/01/2011 0800   CL 104 12/08/2010 0851   CO2 30 04/01/2011 0800   CO2 28 12/08/2010 0851   GLUCOSE 124* 04/01/2011 0800   GLUCOSE 89 12/08/2010 0851   BUN 13 04/01/2011 0800   BUN 14 12/08/2010 0851   CREATININE 0.7 04/01/2011 0800   CREATININE 0.78 07/25/2010 1311   CALCIUM 9.2 04/01/2011 0800   CALCIUM 9.1 12/08/2010 0851   GFRNONAA >60 06/18/2010 0358   GFRAA  Value: >60        The eGFR has been calculated using the MDRD equation. This calculation has not been validated in all clinical situations. eGFR's persistently <60 mL/min signify possible Chronic Kidney Disease. 06/18/2010 0358    Lipid Panel     Component Value Date/Time   CHOL 190 12/08/2010 0851   TRIG 104 12/08/2010 0851   HDL 39* 12/08/2010 0851   CHOLHDL 4.9 12/08/2010 0851   VLDL 21 12/08/2010 0851   LDLCALC 130* 12/08/2010 0851    CBC    Component Value Date/Time   WBC 7.0 04/01/2011 0800   WBC 12.9* 06/18/2010 0358   RBC 5.84* 04/01/2011 0800   RBC 4.58 06/18/2010 0358   HGB 15.1 04/01/2011 0800   HGB 11.6* 06/18/2010 0358   HCT 45.7 04/01/2011 0800   HCT 36.7 06/18/2010 0358   PLT 191 04/01/2011 0800   PLT 162 06/18/2010 0358   MCV 78.3* 04/01/2011 0800   MCV 80.1 06/18/2010 0358   MCH 25.9 04/01/2011 0800   MCH 25.3* 06/18/2010 0358   MCHC 33.0 04/01/2011 0800   MCHC 31.6 06/18/2010 0358   RDW 15.7* 04/01/2011 0800   RDW 16.5* 06/18/2010 0358   LYMPHSABS 1.3 04/01/2011 0800   LYMPHSABS 2.2 06/03/2009 2203   MONOABS 0.6 04/01/2011 0800   MONOABS 0.6 06/03/2009 2203   EOSABS 0.1 04/01/2011 0800   EOSABS 0.2 06/03/2009 2203   BASOSABS 0.0 04/01/2011 0800   BASOSABS 0.0 06/03/2009 2203

## 2011-04-22 ENCOUNTER — Ambulatory Visit (INDEPENDENT_AMBULATORY_CARE_PROVIDER_SITE_OTHER): Payer: Medicare Other | Admitting: *Deleted

## 2011-04-22 VITALS — Temp 98.8°F

## 2011-04-22 DIAGNOSIS — Z23 Encounter for immunization: Secondary | ICD-10-CM

## 2011-07-24 ENCOUNTER — Other Ambulatory Visit: Payer: Self-pay | Admitting: Family Medicine

## 2011-07-24 DIAGNOSIS — I10 Essential (primary) hypertension: Secondary | ICD-10-CM

## 2011-07-24 MED ORDER — HYDROCHLOROTHIAZIDE 25 MG PO TABS: 25.0000 mg | ORAL_TABLET | Freq: Every day | ORAL | Status: DC

## 2011-07-27 ENCOUNTER — Ambulatory Visit (INDEPENDENT_AMBULATORY_CARE_PROVIDER_SITE_OTHER): Payer: Medicare Other | Admitting: Family Medicine

## 2011-07-27 ENCOUNTER — Encounter: Payer: Self-pay | Admitting: Family Medicine

## 2011-07-27 DIAGNOSIS — F17200 Nicotine dependence, unspecified, uncomplicated: Secondary | ICD-10-CM

## 2011-07-27 DIAGNOSIS — G8918 Other acute postprocedural pain: Secondary | ICD-10-CM

## 2011-07-27 DIAGNOSIS — E7800 Pure hypercholesterolemia, unspecified: Secondary | ICD-10-CM

## 2011-07-27 DIAGNOSIS — F330 Major depressive disorder, recurrent, mild: Secondary | ICD-10-CM

## 2011-07-27 DIAGNOSIS — I10 Essential (primary) hypertension: Secondary | ICD-10-CM

## 2011-07-27 MED ORDER — DICLOFENAC SODIUM 1 % TD GEL: 1.0000 "application " | Freq: Four times a day (QID) | TRANSDERMAL | Status: DC

## 2011-07-27 MED ORDER — NEBIVOLOL HCL 10 MG PO TABS: 10.0000 mg | ORAL_TABLET | Freq: Every day | ORAL | Status: DC

## 2011-07-27 MED ORDER — PRAVASTATIN SODIUM 80 MG PO TABS: 80.0000 mg | ORAL_TABLET | Freq: Every day | ORAL | Status: DC

## 2011-07-27 MED ORDER — HYDROCHLOROTHIAZIDE 25 MG PO TABS: 25.0000 mg | ORAL_TABLET | Freq: Every day | ORAL | Status: DC

## 2011-07-27 MED ORDER — CLONIDINE HCL 0.1 MG PO TABS: 0.1000 mg | ORAL_TABLET | Freq: Every day | ORAL | Status: DC

## 2011-07-27 MED ORDER — TRAMADOL HCL 50 MG PO TABS: 50.0000 mg | ORAL_TABLET | Freq: Four times a day (QID) | ORAL | Status: DC | PRN

## 2011-07-27 NOTE — Progress Notes (Signed)
  Subjective:    Patient ID: Lauren [name], female    DOB: 12/01/1950, 61 y.o.   MRN: [id number]  HPI  Farrie comes in for follow up.  She says that she wants to talk about her cholesterol medication.  She has not been taking it because she does not like taking it at night, and she does not like taking so many pills in general.  She says she is wondering if she really needs it, however, she does admit she has gained quite a bit of weight this winter. Pt says that she "hibernates" in the winter, so she gains weight, but she is planning on getting out and exercising now that it is getting warm again.    Depression- pt is in good spirits, and says that she has gotten involved in her community and her church, and spends time reading inspirational quotes, which helps.  She says she has found more self-confidence and feels like she does not need to rely on a man for her emotional well-being now.   Tobacco use- she says she is smoking about 1/2 ppd unless she is stressed out.  She says that this is OK for now.  She did not ever start wellbutrin, as she says her mood was better and she does not like taking pills.   HTN- she is taking all her blood pressure medication, although she does not like taking so many.  She denies any angina or dyspnea.    Review of Systems Pertinent items in HPI.    Objective:   Physical Exam BP 140/88  Pulse 74  Temp(Src) 98 F (36.7 C) (Oral)  Ht 5\' 4"  (1.626 m)  Wt 174 lb (78.926 kg)  BMI 29.87 kg/m2 General appearance: alert, cooperative and no distress Eyes: conjunctivae/corneas clear. PERRL, EOM's intact. Fundi benign. Neck: no adenopathy, no carotid bruit, no JVD, supple, symmetrical, trachea midline and thyroid not enlarged, symmetric, no tenderness/mass/nodules Lungs: clear to auscultation bilaterally Heart: regular rate and rhythm, S1, S2 normal, no murmur, click, rub or gallop Extremities: extremities normal, atraumatic, no cyanosis or edema Pulses: 2+  and symmetric Neurologic: Grossly normal Psych: Judgement and insight normal, Speech normal, mood seems good.        Assessment & Plan:

## 2011-07-27 NOTE — Patient Instructions (Signed)
It was good to see you.  You can take your cholesterol medication in the morning instead of at night time so you do not forget it.  Please try to get more exercise- losing a few pounds will help your blood pressure and your cholesterol.    Please come back for a visit in July or August and we will check your cholesterol.  If you have any problems or questions, please feel free to call the office.

## 2011-09-28 ENCOUNTER — Ambulatory Visit (HOSPITAL_COMMUNITY)
Admission: RE | Admit: 2011-09-28 | Discharge: 2011-09-28 | Disposition: A | Discharge status: Home / Self Care | Payer: Medicare Other | Source: Ambulatory Visit | Attending: Oncology | Admitting: Oncology

## 2011-09-28 ENCOUNTER — Other Ambulatory Visit (HOSPITAL_BASED_OUTPATIENT_CLINIC_OR_DEPARTMENT_OTHER): Payer: Medicare Other | Admitting: Lab

## 2011-09-28 DIAGNOSIS — R05 Cough: Secondary | ICD-10-CM | POA: Insufficient documentation

## 2011-09-28 DIAGNOSIS — Z85118 Personal history of other malignant neoplasm of bronchus and lung: Secondary | ICD-10-CM

## 2011-09-28 DIAGNOSIS — I2510 Atherosclerotic heart disease of native coronary artery without angina pectoris: Secondary | ICD-10-CM | POA: Insufficient documentation

## 2011-09-28 DIAGNOSIS — Z85038 Personal history of other malignant neoplasm of large intestine: Secondary | ICD-10-CM

## 2011-09-28 DIAGNOSIS — R059 Cough, unspecified: Secondary | ICD-10-CM | POA: Insufficient documentation

## 2011-09-28 DIAGNOSIS — R0602 Shortness of breath: Secondary | ICD-10-CM | POA: Insufficient documentation

## 2011-09-28 DIAGNOSIS — C3490 Malignant neoplasm of unspecified part of unspecified bronchus or lung: Secondary | ICD-10-CM | POA: Insufficient documentation

## 2011-09-28 DIAGNOSIS — C189 Malignant neoplasm of colon, unspecified: Secondary | ICD-10-CM | POA: Insufficient documentation

## 2011-09-28 DIAGNOSIS — C3410 Malignant neoplasm of upper lobe, unspecified bronchus or lung: Secondary | ICD-10-CM

## 2011-09-28 LAB — CBC WITH DIFFERENTIAL/PLATELET
BASO%: 0.7 % (ref 0.0–2.0)
EOS%: 1.8 % (ref 0.0–7.0)
Eosinophils Absolute: 0.1 10*3/uL (ref 0.0–0.5)
LYMPH%: 19 % (ref 14.0–49.7)
MCH: 24.9 pg — ABNORMAL LOW (ref 25.1–34.0)
MCHC: 32.6 g/dL (ref 31.5–36.0)
MCV: 76.3 fL — ABNORMAL LOW (ref 79.5–101.0)
MONO%: 7.1 % (ref 0.0–14.0)
Platelets: 216 10*3/uL (ref 145–400)
RBC: 5.74 10*6/uL — ABNORMAL HIGH (ref 3.70–5.45)
RDW: 16.1 % — ABNORMAL HIGH (ref 11.2–14.5)
nRBC: 0 % (ref 0–0)

## 2011-09-28 LAB — CEA: CEA: 2.8 ng/mL (ref 0.0–5.0)

## 2011-09-28 LAB — CMP (CANCER CENTER ONLY)
AST: 15 U/L (ref 11–38)
BUN, Bld: 9 mg/dL (ref 7–22)
Calcium: 8.7 mg/dL (ref 8.0–10.3)
Chloride: 98 mEq/L (ref 98–108)
Creat: 0.7 mg/dl (ref 0.6–1.2)
Total Bilirubin: 0.8 mg/dl (ref 0.20–1.60)

## 2011-09-28 MED ORDER — IOHEXOL 300 MG/ML  SOLN
80.0000 mL | Freq: Once | INTRAMUSCULAR | Status: AC | PRN
  Administered 2011-09-28: 80 mL via INTRAVENOUS

## 2011-09-30 ENCOUNTER — Telehealth: Payer: Self-pay | Admitting: *Deleted

## 2011-09-30 ENCOUNTER — Other Ambulatory Visit: Payer: Medicare Other | Admitting: Lab

## 2011-09-30 ENCOUNTER — Ambulatory Visit (HOSPITAL_BASED_OUTPATIENT_CLINIC_OR_DEPARTMENT_OTHER): Payer: Medicare Other | Admitting: Oncology

## 2011-09-30 VITALS — BP 122/74 | HR 70 | Temp 97.0°F | Ht 64.0 in | Wt 167.6 lb

## 2011-09-30 DIAGNOSIS — C3490 Malignant neoplasm of unspecified part of unspecified bronchus or lung: Secondary | ICD-10-CM

## 2011-09-30 DIAGNOSIS — R0609 Other forms of dyspnea: Secondary | ICD-10-CM

## 2011-09-30 DIAGNOSIS — F17200 Nicotine dependence, unspecified, uncomplicated: Secondary | ICD-10-CM

## 2011-09-30 DIAGNOSIS — R911 Solitary pulmonary nodule: Secondary | ICD-10-CM

## 2011-09-30 DIAGNOSIS — Z85038 Personal history of other malignant neoplasm of large intestine: Secondary | ICD-10-CM

## 2011-09-30 DIAGNOSIS — C189 Malignant neoplasm of colon, unspecified: Secondary | ICD-10-CM

## 2011-09-30 NOTE — Telephone Encounter (Signed)
gave patient appointment for pet scan for 10-08-2011 arrival time 10:15am gave patient appointment for 11-30-2011 at 10:15 with the midlevel

## 2011-09-30 NOTE — Patient Instructions (Addendum)
A.  CT chest result from 09/28/2011:  Findings:  Mediastinum: Heart size is normal. There is no significant  pericardial fluid, thickening or pericardial calcification. There  are numerous enlarged mediastinal lymph nodes, the largest of which  is in the low right paratracheal position measuring 2.1 x 2.8 cm  (image 21 of series 2). No definite hilar adenopathy identified.  The esophagus is unremarkable in appearance. There is  atherosclerosis of the thoracic aorta, the great vessels of the  mediastinum and the coronary arteries, including calcified  atherosclerotic plaque in the left main, left anterior descending  and left circumflex coronary arteries. .  Lungs/Pleura: Postoperative changes of left upper lobectomy are  again noted. There are areas of pleural-based scarring in the  periphery of the left lower lobe (unchanged). Compared to the prior  exam from 04/01/2011 there has been interval enlargement of a  nodule in the posterior aspect of the right upper lobe (image 14 of  series 5) which currently measures 1.0 x 0.7 cm (previously 5 x 4  mm). This lesion has somewhat ill-defined margins with what  appeared to be some early spiculations. Also on image 14 more  laterally there is an area of ill-defined ground-glass attenuation  which is similar to prior examinations. There is a background of  mild centrilobular emphysema and mild diffuse bronchial wall  thickening. There are a few other tiny 2-3 mm pulmonary nodules  and some scattered thick-walled cysts (best demonstrated on images  13 and 14 of series 5 in the anterior aspect of the right upper  lobe), which could suggest a smoking related disease such as  pulmonary Langerhans cell histiocytosis. No consolidative airspace  disease. No pleural effusions.  Upper Abdomen: Atherosclerosis in the visualized vasculature. Sub  centimeter low attenuation lesion in the posterior aspect of the  upper pole of the left kidney is too small  to characterize, but is  unchanged compared to the prior examination and is favored to  represent a small cyst.  Musculoskeletal: Post thoracotomy changes noted in the left  hemithorax. There are no aggressive appearing lytic or blastic  lesions noted in the visualized portions of the skeleton.   IMPRESSION:   1. Interval enlargement of what is now an ill-defined 1.0 x 0.7 cm  slightly spiculated right upper lobe pulmonary nodule, with  development of right paratracheal lymphadenopathy, as above.  Findings are suspicious for potential malignancy, and further  evaluation with PET CT and/or CT-guided percutaneous needle biopsy  of the right upper lobe nodule should be considered.  2. 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 Counseling for smoking cessation is  highly recommended.  3. 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 Please note that although the presence of coronary  artery calcium documents the presence of coronary artery disease,  the severity of this disease and any potential stenosis cannot be  assessed on this non-gated CT examination. Assessment for  potential risk factor modification, dietary therapy or  pharmacologic therapy may be warranted, if clinically indicated.  4. Status post left upper lobectomy.   B.  Plan: - PET scan within 10 days.  - Follow up with Thoracic surgeon after PET scan to decide what to do. - Follow up with [place] in about 2 months to make sure you're not lost to follow up. - Ms. Fonnie Mu will contact you about smoking cessation class.

## 2011-09-30 NOTE — Progress Notes (Signed)
[facility]  Telephone:([phone] [phone]   OFFICE PROGRESS NOTE   Cc:  Ardyth Gal, MD, MD   DIAGNOSIS AND PAST THERAPY:  1. 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 Her post resection surveillance colonoscopy onOctober 14, 2009, by Dr. Christella Hartigan was negative. 2. pT1a, pN0 well differentiated adenocarcinoma of lung; s/p left upper lobectomy on 06/16/2010.  CURRENT THERAPY: watchful observation.  INTERVAL HISTORY: Lauren [name] 61 y.o. female returns for regular follow up by herself.  She has mild to moderate fatigue; however, she is still independent of activities of daily living.  She spends time working in the garden few days a week without major problem.  She still has mild SOB and DOE after lobectomy in 05/2010.  She does not have inhalers.  She still smokes cigarettes.  She has tried Chantix, smoking cigarettes, nicotine patch, and Bupropion without success.    Patient denies headache, visual changes, confusion, drenching night sweats, palpable lymph node swelling, mucositis, odynophagia, dysphagia, nausea vomiting, jaundice, chest pain, palpitation, productive cough, gum bleeding, epistaxis, hematemesis, hemoptysis, abdominal pain, abdominal swelling, early satiety, melena, hematochezia, hematuria, skin rash, spontaneous bleeding, joint swelling, joint pain, heat or cold intolerance, bowel bladder incontinence, back pain, focal motor weakness, paresthesia, depression, suicidal or homocidal ideation, feeling hopelessness.   Past Medical History  Diagnosis Date  . Depression   . Hyperlipidemia   . Hypertension   . Colon cancer   . Lung cancer     PET scan 04/28/2010; primary: increase in size 02/2010  . Lung nodule     FNA ordered for 04/02/10 by Myesha Stillion>pos Ca  . Chronic folliculitis     of groin  .  Fibrocystic breast changes   . Hypokalemia   . Syncope   . Mitral valve prolapse   . Family history of trichomonal vaginitis 05/2005  . Postmenopausal   . Depressive disorder   . Hypercholesterolemia   . GERD (gastroesophageal reflux disease)   . Cerebral aneurysm   . Back pain     Past Surgical History  Procedure Date  . Colon surgery   . Tubal ligation   . Lung lobectomy 2012  . Hernia repair     Current Outpatient Prescriptions  Medication Sig Dispense Refill  . aspirin 81 MG EC tablet Take 1 tablet (81 mg total) by mouth daily.  30 tablet  5  . cloNIDine (CATAPRES) 0.1 MG tablet Take 1 tablet (0.1 mg total) by mouth at bedtime.  60 tablet  5  . diclofenac sodium (VOLTAREN) 1 % GEL Apply 1 application topically 4 (four) times daily. As needed fr pain  100 g  2  . hydrochlorothiazide (HYDRODIURIL) 25 MG tablet Take 1 tablet (25 mg total) by mouth daily.  30 tablet  5  . nebivolol (BYSTOLIC) 10 MG tablet Take 1 tablet (10 mg total) by mouth daily.  30 tablet  5  . nitroGLYCERIN (NITROSTAT) 0.4 MG SL tablet Place 1 tablet (0.4 mg total) under the tongue every 5 (five) minutes as needed for chest pain.  15 tablet  0  . omeprazole (PRILOSEC) 20 MG capsule Take 20 mg by mouth daily.        . pravastatin (PRAVACHOL) 80 MG tablet Take 1 tablet (80 mg total) by mouth at bedtime.  30 tablet  5  . traMADol (ULTRAM) 50 MG tablet Take 1  tablet (50 mg total) by mouth every 6 (six) hours as needed for pain.  60 tablet  5  . DISCONTD: buPROPion (WELLBUTRIN XL) 150 MG 24 hr tablet Take 1 tablet (150 mg total) by mouth 2 (two) times daily.  60 tablet  5    ALLERGIES:  is allergic to lisinopril.  REVIEW OF SYSTEMS:  The rest of the 14-point review of system was negative.   Filed Vitals:   09/30/11 1001  BP: 122/74  Pulse: 70  Temp: 97 F (36.1 C)   Wt Readings from Last 3 Encounters:  09/30/11 167 lb 9.6 oz (76.023 kg)  07/27/11 174 lb (78.926 kg)  04/21/11 169 lb (76.658 kg)   ECOG  Performance status: 1  PHYSICAL EXAMINATION:   General: well-nourished in no acute distress. Eyes: no scleral icterus. ENT: There were no oropharyngeal lesions. Neck was without thyromegaly. Lymphatics: Negative cervical, supraclavicular or axillary adenopathy. Respiratory: lungs were clear bilaterally without wheezing or crackles. Cardiovascular: Regular rate and rhythm, S1/S2, without murmur, rub or gallop. There was no pedal edema. GI: abdomen was soft, flat, nontender, nondistended, without organomegaly. Muscoloskeletal: no spinal tenderness of palpation of vertebral spine. Skin exam was without echymosis, petichae. Neuro exam was nonfocal. Patient was able to get on and off exam table without assistance. Gait was normal. Patient was alerted and oriented. Attention was good. Language was appropriate. Mood was normal without depression. Speech was not pressured. Thought content was not tangential.   LABORATORY/RADIOLOGY DATA:  Lab Results  Component Value Date   WBC 8.2 09/28/2011   HGB 14.3 09/28/2011   HCT 43.8 09/28/2011   PLT 216 09/28/2011   GLUCOSE 112 09/28/2011   CHOL 190 12/08/2010   TRIG 104 12/08/2010   HDL 39* 12/08/2010   LDLDIRECT 111* 02/27/2010   LDLCALC 130* 12/08/2010   ALKPHOS 99* 09/28/2011   ALT 8 07/25/2010   AST 15 09/28/2011   NA 143 09/28/2011   K 3.5 09/28/2011   CL 98 09/28/2011   CREATININE 0.7 09/28/2011   BUN 9 09/28/2011   CO2 30 09/28/2011   INR 0.99 06/12/2010     RADIOLOGY:  I personally reviewed the CT chest performed on 09/28/2011 and compared it to 04/01/2011.  There was enlargement of right upper lobe nodule.   Ct Chest W Contrast  09/28/2011  *RADIOLOGY REPORT*  Clinical Data:  History of lung cancer.  History of colon cancer status post colon resection.  Shortness of breath.  Cough.  CT CHEST WITH CONTRAST  Technique:  Multidetector CT imaging of the chest was performed dur ing intravenous contrast   administration.  Contrast:  80 ml of Omnipaque-300.   Comparison:  Chest CT 04/01/2011.  Findings:  Mediastinum: Heart size is normal. There is no significant pericardial fluid, thickening or pericardial calcification.  There are numerous enlarged mediastinal lymph nodes, the largest of which is in the low right paratracheal position measuring 2.1 x 2.8 cm (image 21 of series 2).  No definite hilar adenopathy identified. The esophagus is unremarkable in appearance. There is atherosclerosis of the thoracic aorta, the great vessels of the mediastinum and the coronary arteries, including calcified atherosclerotic plaque in the left main, left anterior descending and left circumflex coronary arteries. .  Lungs/Pleura: Postoperative changes of left upper lobectomy are again noted.  There are areas of pleural-based scarring in the periphery of the left lower lobe (unchanged). Compared to the prior exam from 04/01/2011 there has been interval enlargement of a nodule in the posterior  aspect of the right upper lobe (image 14 of series 5) which currently measures 1.0 x 0.7 cm (previously 5 x 4 mm).  This lesion has somewhat ill-defined margins with what appeared to be some early spiculations.  Also on image 14 more laterally there is an area of ill-defined ground-glass attenuation which is similar to prior examinations.  There is a background of mild centrilobular emphysema and mild diffuse bronchial wall thickening.  There are a few other tiny 2-3 mm pulmonary nodules and some scattered thick-walled cysts (best demonstrated on images 13 and 14 of series 5 in the anterior aspect of the right upper lobe), which could suggest a smoking related disease such as pulmonary Langerhans cell histiocytosis. No consolidative airspace disease.  No pleural effusions.  Upper Abdomen: Atherosclerosis in the visualized vasculature.  Sub centimeter low attenuation lesion in the posterior aspect of the upper pole of the left kidney is too small to characterize, but is unchanged compared to the  prior examination and is favored to represent a small cyst.  Musculoskeletal: Post thoracotomy changes noted in the left hemithorax. There are no aggressive appearing lytic or blastic lesions noted in the visualized portions of the skeleton.  IMPRESSION:  1.  Interval enlargement of what is now an ill-defined 1.0 x 0.7 cm slightly spiculated right upper lobe pulmonary nodule, with development of right paratracheal lymphadenopathy, as above. Findings are suspicious for potential malignancy, and further evaluation with PET CT and/or CT-guided percutaneous needle biopsy of the right upper lobe nodule should be considered. 2.  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 Counseling for smoking cessation is highly recommended. 3. Atherosclerosis, including left main and two-vessel coronary artery disease. Please note that although the presence of coronary artery calcium documents the presence of coronary artery disease, the severity of this disease and any potential stenosis cannot be assessed on this non-gated CT examination.  Assessment for potential risk factor modification, dietary therapy or pharmacologic therapy may be warranted, if clinically indicated. 4.  Status post left upper lobectomy.  This was made a call report.  Original Report Authenticated By: Florencia Reasons, M.D.    ASSESSMENT AND PLAN:   1.  History of stage I non-small cell lung cancer, adenocarcinoma in 2012: s/p left lobectomy.  She now has new nodules in right upper lobe.  I went ahead and order a PET scan to evaluate the extend of her disease.  I appreciate Dr. Sharee Pimple evaluation of patient after PET scan for treatment options.   2.  History of colon cancer: She has no evidence of recurrent or metastatic disease for her colon cancer. Surveillance for her history of colon cancer entails once-a-year CT scan for 5 years'  total, with the next CT abdomen in November 2013. She had a surveillance colonoscopy in October 2012 by Dr Christella Hartigan, which showed polyps. Per his recommendation, the next colonoscopy is due in Oct 2015 but sooner if she has symptoms.   3.  Smoking: She conceded that her smoking is an addiction, and she has not been able to quit despite all available options (Chantix, nicotine patch, smokeless cigarettes, Buproprion).  I again advised her of the harms that smoking is doing.  I recomended smoking cessation class, and she agreed to it.   4.  Dyspnea on exertion:  Most likely due to active smoking; in addition to past left lobectomy.  I advised her to see her  PCP to see if inhalers are appropriate.   5.  Hypertension: She is on HCTZ and clonidine per PCP.  6.  Hypercholesterolemia: She is on pravastatin per PCP.  7.  Depression/anxiety: She is on clonidine, citalopram per PCP.   8.  Follow up: with [place] in about 2 months to ensure that she is not lost to follow up.

## 2011-10-05 ENCOUNTER — Telehealth: Payer: Self-pay | Admitting: *Deleted

## 2011-10-05 ENCOUNTER — Encounter: Payer: Self-pay | Admitting: Family Medicine

## 2011-10-05 ENCOUNTER — Ambulatory Visit (INDEPENDENT_AMBULATORY_CARE_PROVIDER_SITE_OTHER): Payer: Medicare Other | Admitting: Family Medicine

## 2011-10-05 VITALS — BP 145/74 | HR 79 | Temp 97.9°F | Ht 64.0 in | Wt 170.9 lb

## 2011-10-05 DIAGNOSIS — R0609 Other forms of dyspnea: Secondary | ICD-10-CM

## 2011-10-05 DIAGNOSIS — F17200 Nicotine dependence, unspecified, uncomplicated: Secondary | ICD-10-CM

## 2011-10-05 DIAGNOSIS — G4700 Insomnia, unspecified: Secondary | ICD-10-CM

## 2011-10-05 DIAGNOSIS — R0989 Other specified symptoms and signs involving the circulatory and respiratory systems: Secondary | ICD-10-CM

## 2011-10-05 HISTORY — DX: Insomnia, unspecified: G47.00

## 2011-10-05 MED ORDER — ALBUTEROL SULFATE HFA 108 (90 BASE) MCG/ACT IN AERS: 2.0000 | INHALATION_SPRAY | Freq: Four times a day (QID) | RESPIRATORY_TRACT | Status: DC | PRN

## 2011-10-05 MED ORDER — ZOLPIDEM TARTRATE 5 MG PO TABS: 5.0000 mg | ORAL_TABLET | Freq: Every evening | ORAL | Status: DC | PRN

## 2011-10-05 NOTE — Assessment & Plan Note (Signed)
Patient has had spirometry in our office that did not show reactive airway component, but as she felt like albuterol helped in the past, will rx inhaler.

## 2011-10-05 NOTE — Assessment & Plan Note (Signed)
Likely related to depression and possible cancer recurrence.  Discussed sleep hygiene, will rx ambien to see if we can improve her sleep length.

## 2011-10-05 NOTE — Progress Notes (Signed)
  Subjective:    Patient ID: Lauren [name], female    DOB: 1950/12/03, 61 y.o.   MRN: [id number]  HPI  Lauren [name] presents for follow up.  Lauren [name] has recently seen her oncologist and a follow up CT scan showed a new lung nodule. Lauren [name] has a PET scan ordered for later this week.    Lauren [name] says the stress of all this has actually made her smoke more- Lauren [name] is now smoking a pack a day again.  Lauren [name] has accepted that this is an addiction, but is still angry with herself for not being able to quit.   Lauren [name] says that Lauren [name] has some dyspnea when Lauren [name] has to walk a good distance (like from the back of the [employer] parking lot). Lauren [name] used to have an albuterol inhaler, which Lauren [name] thought helped, but has not had one in a long time.   Lealer says Lauren [name] is only sleeping about 4 hours a night, and Lauren [name] feels tired all the time. Lauren [name] says Lauren [name] has tried different rituals/routines without success for sleeping longer.   Review of Systems Pertinent items in HPI    Objective:   Physical Exam BP 145/74  Pulse 79  Temp(Src) 97.9 F (36.6 C) (Oral)  Ht 5\' 4"  (1.626 m)  Wt 170 lb 14.4 oz (77.52 kg)  BMI 29.34 kg/m2 General appearance: alert, cooperative and no distress Lungs: clear to auscultation bilaterally and decreased breath sounds left base. Heart: regular rate and rhythm, S1, S2 normal, no murmur, click, rub or gallop Extremities: extremities normal, atraumatic, no cyanosis or edema Pulses: 2+ and symmetric       Assessment & Plan:

## 2011-10-05 NOTE — Patient Instructions (Signed)
It was good to see you.  Please let me know if the Ambien helps you sleep or not- if it makes you too groggy we cant ry a different medication.   I will be looking to hear about the results of your PET scan- please let me know if you have any questions or problems.

## 2011-10-05 NOTE — Assessment & Plan Note (Signed)
Unfortunately, Lauren [name] has been unable to quit smoking despite colon and lung cancer.  She has a PET scan later this week, but she may have a recurrence of lung cancer.  I encouraged her to keep trying to quit, and to let me know if she was interested in trying nicotine replacement or chantix again.

## 2011-10-05 NOTE — Telephone Encounter (Signed)
Message copied by Priscille Heidelberg on Mon Oct 05, 2011  2:17 PM ------      Message from: HA, Raliegh Ip T      Created: Wed Sep 30, 2011 10:32 AM      Regarding: Smoking cessation class       Please arrange for the class.  Thanks.

## 2011-10-05 NOTE — Telephone Encounter (Signed)
Patient referred by Dr. Gaylyn Rong to smoking cessation classes. Danelle Berry Smoking Cessation class facilitator called patient 10/05/11 to let patient know about our smoking cessation program at the [facility]. Patient signed up for the next smoking cessation class that starts November 24, 2011. Patient given all of the information about program. Patient verbalized understanding.

## 2011-10-07 ENCOUNTER — Telehealth: Payer: Self-pay | Admitting: *Deleted

## 2011-10-07 NOTE — Telephone Encounter (Signed)
Pt left VM asking about appt to see Surgeon.  She has not heard anything yet.  She has PET tomorrow.  Spoke w/ Riley Lam in scheduling to f/u on this referral and I called pt back and left her a VM that scheduling should be calling her soon w/ appt for surgeon (Dr. Gaylyn Rong ordered referral to Dr. Laneta Simmers w [facility].

## 2011-10-08 ENCOUNTER — Encounter (HOSPITAL_COMMUNITY)
Admission: RE | Admit: 2011-10-08 | Discharge: 2011-10-08 | Disposition: A | Discharge status: Home / Self Care | Payer: Medicare Other | Source: Ambulatory Visit | Attending: Oncology | Admitting: Oncology

## 2011-10-08 ENCOUNTER — Telehealth: Payer: Self-pay | Admitting: *Deleted

## 2011-10-08 ENCOUNTER — Telehealth: Payer: Self-pay | Admitting: Oncology

## 2011-10-08 DIAGNOSIS — J984 Other disorders of lung: Secondary | ICD-10-CM | POA: Insufficient documentation

## 2011-10-08 DIAGNOSIS — C3490 Malignant neoplasm of unspecified part of unspecified bronchus or lung: Secondary | ICD-10-CM | POA: Insufficient documentation

## 2011-10-08 DIAGNOSIS — C189 Malignant neoplasm of colon, unspecified: Secondary | ICD-10-CM

## 2011-10-08 DIAGNOSIS — R599 Enlarged lymph nodes, unspecified: Secondary | ICD-10-CM | POA: Insufficient documentation

## 2011-10-08 DIAGNOSIS — Z902 Acquired absence of lung [part of]: Secondary | ICD-10-CM | POA: Insufficient documentation

## 2011-10-08 DIAGNOSIS — R918 Other nonspecific abnormal finding of lung field: Secondary | ICD-10-CM | POA: Insufficient documentation

## 2011-10-08 DIAGNOSIS — J438 Other emphysema: Secondary | ICD-10-CM | POA: Insufficient documentation

## 2011-10-08 DIAGNOSIS — R911 Solitary pulmonary nodule: Secondary | ICD-10-CM

## 2011-10-08 MED ORDER — FLUDEOXYGLUCOSE F - 18 (FDG) INJECTION
19.5000 | Freq: Once | INTRAVENOUS | Status: AC | PRN
  Administered 2011-10-08: 19.5 via INTRAVENOUS

## 2011-10-08 NOTE — Telephone Encounter (Signed)
Called pt and informed of reply by Dr. Gaylyn Rong re PET scan.  She verbalized understanding and is waiting to hear about appt at [facility].  She will call us again for f/u after that appt.Lauren [name]

## 2011-10-08 NOTE — Telephone Encounter (Signed)
Pt left message requesting results of her PET scan from today.

## 2011-10-08 NOTE — Telephone Encounter (Signed)
called Dr. Sharee Pimple office s/w [facility] and she stated that once she s/w the MD about his schedule she will call to with appt.. called and informed pt of info

## 2011-10-08 NOTE — Telephone Encounter (Signed)
Please tell her that the right lung mass was hot on the PET scan.  There was no evidence of distant met.  She needs to follow up with [facility] to decide what to do.  Please advise her to stop smoking, again.  Thanks.

## 2011-10-16 ENCOUNTER — Encounter (HOSPITAL_COMMUNITY): Payer: Self-pay | Admitting: Pharmacy Technician

## 2011-10-16 ENCOUNTER — Encounter (HOSPITAL_COMMUNITY): Payer: Self-pay | Admitting: *Deleted

## 2011-10-16 ENCOUNTER — Other Ambulatory Visit: Payer: Self-pay

## 2011-10-16 ENCOUNTER — Ambulatory Visit (INDEPENDENT_AMBULATORY_CARE_PROVIDER_SITE_OTHER): Payer: Medicare Other | Admitting: Surgery

## 2011-10-16 ENCOUNTER — Encounter: Payer: Self-pay | Admitting: Surgery

## 2011-10-16 VITALS — BP 112/70 | HR 72 | Resp 16 | Ht 64.0 in | Wt 167.0 lb

## 2011-10-16 DIAGNOSIS — Z85118 Personal history of other malignant neoplasm of bronchus and lung: Secondary | ICD-10-CM

## 2011-10-16 DIAGNOSIS — D381 Neoplasm of uncertain behavior of trachea, bronchus and lung: Secondary | ICD-10-CM

## 2011-10-16 DIAGNOSIS — R599 Enlarged lymph nodes, unspecified: Secondary | ICD-10-CM

## 2011-10-16 DIAGNOSIS — R590 Localized enlarged lymph nodes: Secondary | ICD-10-CM

## 2011-10-17 ENCOUNTER — Encounter: Payer: Self-pay | Admitting: Surgery

## 2011-10-17 NOTE — Progress Notes (Signed)
[address]            [place] [address]          [phone]       PCP is Ardyth Gal, MD, MD Referring Provider is Exie Parody, MD  Chief Complaint  Patient presents with  . Lung Lesion    eval and treat  . Adenopathy    mediastinal    HPI:  The patient is a 61 year old woman who underwent left thoracotomy with left upper lobectomy by me on 06/16/2010 for a T1a, N0 well-differentiated adenocarcinoma the lung. She has been followed by Dr. Gaylyn Rong who had already been following her for a history of prior colon cancer. She does continue to smoke about one pack of cigarettes per day. She had a CT scan done in November 2012 which showed a new small lesion in the right upper lobe as well as a new small lesion in the left upper lobe. She had a followup CT scan done in May which showed interval enlargement of the nodule in the posterior aspect of the right upper lobe measuring 1.0 x 0.7 cm. This lesion has somewhat ill-defined margins with some early spiculations. She was also noted to have numerous enlarged mediastinal lymph nodes, the largest of which is located in the low right paratracheal region measuring 2.1 x 2.8 cm. She underwent a PET scan which showed markedly hypermetabolic right paratracheal lymphadenopathy as well as borderline hypermetabolic activity within the right upper lobe lung nodule.    Past Medical History  Diagnosis Date  . Depression   . Hyperlipidemia   . Hypertension   . Colon cancer   . Lung cancer     PET scan 04/28/2010; primary: increase in size 02/2010  . Lung nodule     FNA ordered for 04/02/10 by HA>pos Ca  . Chronic folliculitis     of groin  . Fibrocystic breast changes   . Hypokalemia   . Syncope   . Mitral valve prolapse   . Family history of trichomonal vaginitis 05/2005  . Postmenopausal   . Depressive disorder   . Hypercholesterolemia   . GERD (gastroesophageal reflux disease)   . Cerebral aneurysm   .  Back pain   . Shortness of breath   . COPD (chronic obstructive pulmonary disease)   . Coronary artery disease   . Arthritis     Past Surgical History  Procedure Date  . Colon surgery   . Tubal ligation   . Lung lobectomy 2012  . Hernia repair   . Breast surgery     Bil lumpectomy  . Back surgery     Family History  Problem Relation Age of Onset  . Stomach cancer Maternal Aunt   . Breast cancer Cousin   . Cancer Sister     Lymphatic  . Osteoarthritis Father   . Gout Father   . Hypertension Father   . Heart disease Mother     pericarditis;   . Anesthesia problems Neg Hx     Social History History  Substance Use Topics  . Smoking status: Current Everyday Smoker -- 1.5 packs/day    Types: Cigarettes  . Smokeless tobacco: Never Used  . Alcohol Use: 0.0 oz/week     occasional    Current Outpatient Prescriptions  Medication Sig Dispense Refill  . cholecalciferol (VITAMIN D) 1000 UNITS tablet Take 1,000 Units by  mouth daily.      . fish oil-omega-3 fatty acids 1000 MG capsule Take 2,400 g by mouth daily. Also contains FLAX BORAGE      . omeprazole (PRILOSEC) 20 MG capsule Take 20 mg by mouth daily.        Marland Kitchen albuterol (PROVENTIL HFA;VENTOLIN HFA) 108 (90 BASE) MCG/ACT inhaler Inhale 2 puffs into the lungs every 6 (six) hours as needed.      Marland Kitchen aspirin 81 MG EC tablet Take 81 mg by mouth daily.      . cloNIDine (CATAPRES) 0.1 MG tablet Take 0.1 mg by mouth at bedtime.      . diclofenac sodium (VOLTAREN) 1 % GEL Apply 1 application topically 4 (four) times daily. As needed for pain      . hydrochlorothiazide (HYDRODIURIL) 25 MG tablet Take 25 mg by mouth daily.      . nebivolol (BYSTOLIC) 10 MG tablet Take 10 mg by mouth daily.      . nitroGLYCERIN (NITROSTAT) 0.4 MG SL tablet Place 0.4 mg under the tongue every 5 (five) minutes as needed.      . pravastatin (PRAVACHOL) 40 MG tablet Take 80 mg by mouth daily.      . traMADol (ULTRAM) 50 MG tablet Take 50 mg by mouth every  6 (six) hours as needed. As needed for pain.      Marland Kitchen zolpidem (AMBIEN) 5 MG tablet Take 5 mg by mouth at bedtime as needed.      Marland Kitchen DISCONTD: buPROPion (WELLBUTRIN XL) 150 MG 24 hr tablet Take 1 tablet (150 mg total) by mouth 2 (two) times daily.  60 tablet  5    Allergies  Allergen Reactions  . Lisinopril     REACTION: cough    Review of Systems  Constitutional: Positive for fatigue. Negative for fever, chills, activity change and appetite change.  HENT: Negative.   Eyes: Negative.   Respiratory: Positive for shortness of breath.        Mild  Cardiovascular: Negative.   Gastrointestinal: Negative.   Genitourinary: Negative.   Musculoskeletal: Negative.   Neurological: Negative.   Hematological: Negative.   Psychiatric/Behavioral: Negative.     BP 112/70  Pulse 72  Resp 16  Ht 5\' 4"  (1.626 m)  Wt 167 lb (75.751 kg)  BMI 28.67 kg/m2  SpO2 97% Physical Exam  Constitutional: She is oriented to person, place, and time. She appears well-developed and well-nourished. No distress.  HENT:  Head: Normocephalic and atraumatic.  Mouth/Throat: Oropharynx is clear and moist.  Eyes: EOM are normal. Pupils are equal, round, and reactive to light.  Neck: Normal range of motion. Neck supple. No JVD present. No tracheal deviation present. No thyromegaly present.  Cardiovascular: Normal rate, regular rhythm, normal heart sounds and intact distal pulses.  Exam reveals no gallop and no friction rub.   No murmur heard. Pulmonary/Chest: Effort normal and breath sounds normal. No respiratory distress. She has no wheezes. She has no rales. She exhibits tenderness.  Abdominal: Soft. Bowel sounds are normal. She exhibits no distension and no mass. There is no tenderness.  Musculoskeletal: Normal range of motion. She exhibits no edema.  Lymphadenopathy:    She has no cervical adenopathy.  Neurological: She is alert and oriented to person, place, and time. She has normal strength. No cranial nerve  deficit or sensory deficit.  Skin: Skin is warm and dry.  Psychiatric: She has a normal mood and affect.     Diagnostic Tests:   *RADIOLOGY  REPORT*   Clinical Data:  History of lung cancer.  History of colon cancer status post colon resection.  Shortness of breath.  Cough.   CT CHEST WITH CONTRAST   Technique:  Multidetector CT imaging of the chest was performed dur ing intravenous contrast   administration.   Contrast:  80 ml of Omnipaque-300.   Comparison:  Chest CT 04/01/2011.   Findings:   Mediastinum: Heart size is normal. There is no significant pericardial fluid, thickening or pericardial calcification.  There are numerous enlarged mediastinal lymph nodes, the largest of which is in the low right paratracheal position measuring 2.1 x 2.8 cm (image 21 of series 2).  No definite hilar adenopathy identified. The esophagus is unremarkable in appearance. There is atherosclerosis of the thoracic aorta, the great vessels of the mediastinum and the coronary arteries, including calcified atherosclerotic plaque in the left main, left anterior descending and left circumflex coronary arteries. .   Lungs/Pleura: Postoperative changes of left upper lobectomy are again noted.  There are areas of pleural-based scarring in the periphery of the left lower lobe (unchanged). Compared to the prior exam from 04/01/2011 there has been interval enlargement of a nodule in the posterior aspect of the right upper lobe (image 14 of series 5) which currently measures 1.0 x 0.7 cm (previously 5 x 4 mm).  This lesion has somewhat ill-defined margins with what appeared to be some early spiculations.  Also on image 14 more laterally there is an area of ill-defined ground-glass attenuation which is similar to prior examinations.  There is a background of mild centrilobular emphysema and mild diffuse bronchial wall thickening.  There are a few other tiny 2-3 mm pulmonary nodules and some  scattered thick-walled cysts (best demonstrated on images 13 and 14 of series 5 in the anterior aspect of the right upper lobe), which could suggest a smoking related disease such as pulmonary Langerhans cell histiocytosis. No consolidative airspace disease.  No pleural effusions.   Upper Abdomen: Atherosclerosis in the visualized vasculature.  Sub centimeter low attenuation lesion in the posterior aspect of the upper pole of the left kidney is too small to characterize, but is unchanged compared to the prior examination and is favored to represent a small cyst.   Musculoskeletal: Post thoracotomy changes noted in the left hemithorax. There are no aggressive appearing lytic or blastic lesions noted in the visualized portions of the skeleton.   IMPRESSION:   1.  Interval enlargement of what is now an ill-defined 1.0 x 0.7 cm slightly spiculated right upper lobe pulmonary nodule, with development of right paratracheal lymphadenopathy, as above. Findings are suspicious for potential malignancy, and further evaluation with PET CT and/or CT-guided percutaneous needle biopsy of the right upper lobe nodule should be considered. 2.  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 Counseling for smoking cessation is highly recommended. 3. Atherosclerosis, including left main and two-vessel coronary artery disease. Please note that although the presence of coronary artery calcium documents the presence of coronary artery disease, the severity of this disease and any potential stenosis cannot be assessed on this non-gated CT examination.  Assessment for potential risk factor modification, dietary therapy or pharmacologic therapy may be warranted, if clinically indicated. 4.  Status post left upper lobectomy.   .   Original Report Authenticated By: Florencia Reasons,  M.D.   *RADIOLOGY REPORT*   Clinical Data:  Subsequent treatment strategy for  lung cancer and colon cancer.   NUCLEAR MEDICINE PET WHOLE BODY   Fasting Blood Glucose:  99   Technique:  19.5 mCi F-18 FDG was injected intravenously. CT data was obtained and used for attenuation correction and anatomic localization only.  (This was not acquired as a diagnostic CT examination.) Additional exam technical data entered on technologist worksheet.   Comparison:  04/28/2010; 09/28/2011   Findings:   Head/Neck:  Right upper paratracheal node maximum standard uptake value is 20.4.  The small nodule posteriorly in the right upper lobe has a standard uptake value of 2.7, borderline hypermetabolic. Faint ground-glass opacities in the right upper lobe are not discernibly hypermetabolic.  Lingular scarring noted.  Small bilateral axillary lymph nodes are not significantly hypermetabolic.   Chest:  Minimally high activity noted in the left adrenal gland, stable in 2011 and probably incidental.  No associated mass is seen.  No other significant hypermetabolic abdominal activity is noted.   Abdomen/Pelvis:  No abnormal hypermetabolic activity within the liver, pancreas, adrenal glands, or spleen.  No hypermetabolic lymph nodes in the abdomen or pelvis.   Skeleton:  No focal hypermetabolic activity to suggest skeletal metastasis.   IMPRESSION:   1.  Markedly hypermetabolic right paratracheal adenopathy 2.  The right upper lobe solid nodule, which measures about 0.8 x 0.5 cm, has only borderline hypermetabolic activity with maximum standard uptake value of 2.7. Nevertheless, given the small size of the lesion the appearance is concerning for malignancy. 3.  Emphysema. 4.  Prior left upper lobectomy. 5.  The faint ground-glass nodules in the right upper lobe are not discernibly hypermetabolic.   Original Report Authenticated By: Dellia Cloud, M.D.         Impression/Plan:  She has a small, irregular, enlarging right upper lobe lesion that is borderline hypermetabolic on PET scan as well as significant paratracheal lymphadenopathy that is markedly hypermetabolic on PET scan. This is highly suspicious for either recurrence of her lung cancer or a new primary lung cancer with mediastinal lymph node metastasis. I recommended that we perform mediastinoscopy with lymph node biopsies to determine if there are mediastinal lymph node metastasis. This will allow [place] to get enough tissue to make a firm diagnosis and allow further testing if indicated. I discussed the operative procedure with the patient and her daughter. We discussed alternatives, benefits, and risks including but not limited to bleeding, infection, injury to mediastinal structures, and pneumothorax. She understands and would like to proceed as quickly as possible. We have scheduled this for Monday, 10/19/2011.

## 2011-10-17 NOTE — H&P (Signed)
                 [address]            [place] [address]          [phone]       PCP is CHAMBERLAIN,RACHEL, MD, MD Referring Provider is Ha, Huan T, MD  Chief Complaint  Patient presents with  . Lung Lesion    eval and treat  . Adenopathy    mediastinal    HPI:  The patient is a 61-year-old woman who underwent left thoracotomy with left upper lobectomy by me on 06/16/2010 for a T1a, N0 well-differentiated adenocarcinoma the lung. She has been followed by Dr. Ha who had already been following her for a history of prior colon cancer. She does continue to smoke about one pack of cigarettes per day. She had a CT scan done in November 2012 which showed a new small lesion in the right upper lobe as well as a new small lesion in the left upper lobe. She had a followup CT scan done in May which showed interval enlargement of the nodule in the posterior aspect of the right upper lobe measuring 1.0 x 0.7 cm. This lesion has somewhat ill-defined margins with some early spiculations. She was also noted to have numerous enlarged mediastinal lymph nodes, the largest of which is located in the low right paratracheal region measuring 2.1 x 2.8 cm. She underwent a PET scan which showed markedly hypermetabolic right paratracheal lymphadenopathy as well as borderline hypermetabolic activity within the right upper lobe lung nodule.    Past Medical History  Diagnosis Date  . Depression   . Hyperlipidemia   . Hypertension   . Colon cancer   . Lung cancer     PET scan 04/28/2010; primary: increase in size 02/2010  . Lung nodule     FNA ordered for 04/02/10 by HA>pos Ca  . Chronic folliculitis     of groin  . Fibrocystic breast changes   . Hypokalemia   . Syncope   . Mitral valve prolapse   . Family history of trichomonal vaginitis 05/2005  . Postmenopausal   . Depressive disorder   . Hypercholesterolemia   . GERD (gastroesophageal reflux disease)   . Cerebral aneurysm   .  Back pain   . Shortness of breath   . COPD (chronic obstructive pulmonary disease)   . Coronary artery disease   . Arthritis     Past Surgical History  Procedure Date  . Colon surgery   . Tubal ligation   . Lung lobectomy 2012  . Hernia repair   . Breast surgery     Bil lumpectomy  . Back surgery     Family History  Problem Relation Age of Onset  . Stomach cancer Maternal Aunt   . Breast cancer Cousin   . Cancer Sister     Lymphatic  . Osteoarthritis Father   . Gout Father   . Hypertension Father   . Heart disease Mother     pericarditis;   . Anesthesia problems Neg Hx     Social History History  Substance Use Topics  . Smoking status: Current Everyday Smoker -- 1.5 packs/day    Types: Cigarettes  . Smokeless tobacco: Never Used  . Alcohol Use: 0.0 oz/week     occasional    Current Outpatient Prescriptions  Medication Sig Dispense Refill  . cholecalciferol (VITAMIN D) 1000 UNITS tablet Take 1,000 Units by   mouth daily.      . fish oil-omega-3 fatty acids 1000 MG capsule Take 2,400 g by mouth daily. Also contains FLAX BORAGE      . omeprazole (PRILOSEC) 20 MG capsule Take 20 mg by mouth daily.        . albuterol (PROVENTIL HFA;VENTOLIN HFA) 108 (90 BASE) MCG/ACT inhaler Inhale 2 puffs into the lungs every 6 (six) hours as needed.      . aspirin 81 MG EC tablet Take 81 mg by mouth daily.      . cloNIDine (CATAPRES) 0.1 MG tablet Take 0.1 mg by mouth at bedtime.      . diclofenac sodium (VOLTAREN) 1 % GEL Apply 1 application topically 4 (four) times daily. As needed for pain      . hydrochlorothiazide (HYDRODIURIL) 25 MG tablet Take 25 mg by mouth daily.      . nebivolol (BYSTOLIC) 10 MG tablet Take 10 mg by mouth daily.      . nitroGLYCERIN (NITROSTAT) 0.4 MG SL tablet Place 0.4 mg under the tongue every 5 (five) minutes as needed.      . pravastatin (PRAVACHOL) 40 MG tablet Take 80 mg by mouth daily.      . traMADol (ULTRAM) 50 MG tablet Take 50 mg by mouth every  6 (six) hours as needed. As needed for pain.      . zolpidem (AMBIEN) 5 MG tablet Take 5 mg by mouth at bedtime as needed.      . DISCONTD: buPROPion (WELLBUTRIN XL) 150 MG 24 hr tablet Take 1 tablet (150 mg total) by mouth 2 (two) times daily.  60 tablet  5    Allergies  Allergen Reactions  . Lisinopril     REACTION: cough    Review of Systems  Constitutional: Positive for fatigue. Negative for fever, chills, activity change and appetite change.  HENT: Negative.   Eyes: Negative.   Respiratory: Positive for shortness of breath.        Mild  Cardiovascular: Negative.   Gastrointestinal: Negative.   Genitourinary: Negative.   Musculoskeletal: Negative.   Neurological: Negative.   Hematological: Negative.   Psychiatric/Behavioral: Negative.     BP 112/70  Pulse 72  Resp 16  Ht 5' 4" (1.626 m)  Wt 167 lb (75.751 kg)  BMI 28.67 kg/m2  SpO2 97% Physical Exam  Constitutional: She is oriented to person, place, and time. She appears well-developed and well-nourished. No distress.  HENT:  Head: Normocephalic and atraumatic.  Mouth/Throat: Oropharynx is clear and moist.  Eyes: EOM are normal. Pupils are equal, round, and reactive to light.  Neck: Normal range of motion. Neck supple. No JVD present. No tracheal deviation present. No thyromegaly present.  Cardiovascular: Normal rate, regular rhythm, normal heart sounds and intact distal pulses.  Exam reveals no gallop and no friction rub.   No murmur heard. Pulmonary/Chest: Effort normal and breath sounds normal. No respiratory distress. She has no wheezes. She has no rales. She exhibits tenderness.  Abdominal: Soft. Bowel sounds are normal. She exhibits no distension and no mass. There is no tenderness.  Musculoskeletal: Normal range of motion. She exhibits no edema.  Lymphadenopathy:    She has no cervical adenopathy.  Neurological: She is alert and oriented to person, place, and time. She has normal strength. No cranial nerve  deficit or sensory deficit.  Skin: Skin is warm and dry.  Psychiatric: She has a normal mood and affect.     Diagnostic Tests:   *RADIOLOGY   REPORT*   Clinical Data:  History of lung cancer.  History of colon cancer status post colon resection.  Shortness of breath.  Cough.   CT CHEST WITH CONTRAST   Technique:  Multidetector CT imaging of the chest was performed dur ing intravenous contrast   administration.   Contrast:  80 ml of Omnipaque-300.   Comparison:  Chest CT 04/01/2011.   Findings:   Mediastinum: Heart size is normal. There is no significant pericardial fluid, thickening or pericardial calcification.  There are numerous enlarged mediastinal lymph nodes, the largest of which is in the low right paratracheal position measuring 2.1 x 2.8 cm (image 21 of series 2).  No definite hilar adenopathy identified. The esophagus is unremarkable in appearance. There is atherosclerosis of the thoracic aorta, the great vessels of the mediastinum and the coronary arteries, including calcified atherosclerotic plaque in the left main, left anterior descending and left circumflex coronary arteries. .   Lungs/Pleura: Postoperative changes of left upper lobectomy are again noted.  There are areas of pleural-based scarring in the periphery of the left lower lobe (unchanged). Compared to the prior exam from 04/01/2011 there has been interval enlargement of a nodule in the posterior aspect of the right upper lobe (image 14 of series 5) which currently measures 1.0 x 0.7 cm (previously 5 x 4 mm).  This lesion has somewhat ill-defined margins with what appeared to be some early spiculations.  Also on image 14 more laterally there is an area of ill-defined ground-glass attenuation which is similar to prior examinations.  There is a background of mild centrilobular emphysema and mild diffuse bronchial wall thickening.  There are a few other tiny 2-3 mm pulmonary nodules and some  scattered thick-walled cysts (best demonstrated on images 13 and 14 of series 5 in the anterior aspect of the right upper lobe), which could suggest a smoking related disease such as pulmonary Langerhans cell histiocytosis. No consolidative airspace disease.  No pleural effusions.   Upper Abdomen: Atherosclerosis in the visualized vasculature.  Sub centimeter low attenuation lesion in the posterior aspect of the upper pole of the left kidney is too small to characterize, but is unchanged compared to the prior examination and is favored to represent a small cyst.   Musculoskeletal: Post thoracotomy changes noted in the left hemithorax. There are no aggressive appearing lytic or blastic lesions noted in the visualized portions of the skeleton.   IMPRESSION:   1.  Interval enlargement of what is now an ill-defined 1.0 x 0.7 cm slightly spiculated right upper lobe pulmonary nodule, with development of right paratracheal lymphadenopathy, as above. Findings are suspicious for potential malignancy, and further evaluation with PET CT and/or CT-guided percutaneous needle biopsy of the right upper lobe nodule should be considered. 2.  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 Counseling for smoking cessation is highly recommended. 3. Atherosclerosis, including left main and two-vessel coronary artery disease. Please note that although the presence of coronary artery calcium documents the presence of coronary artery disease, the severity of this disease and any potential stenosis cannot be assessed on this non-gated CT examination.  Assessment for potential risk factor modification, dietary therapy or pharmacologic therapy may be warranted, if clinically indicated. 4.  Status post left upper lobectomy.   .   Original Report Authenticated By: DANIEL W. ENTRIKIN,  M.D.   *RADIOLOGY REPORT*   Clinical Data:  Subsequent treatment strategy for   lung cancer and colon cancer.   NUCLEAR MEDICINE PET WHOLE BODY   Fasting Blood Glucose:  99   Technique:  19.5 mCi F-18 FDG was injected intravenously. CT data was obtained and used for attenuation correction and anatomic localization only.  (This was not acquired as a diagnostic CT examination.) Additional exam technical data entered on technologist worksheet.   Comparison:  04/28/2010; 09/28/2011   Findings:   Head/Neck:  Right upper paratracheal node maximum standard uptake value is 20.4.  The small nodule posteriorly in the right upper lobe has a standard uptake value of 2.7, borderline hypermetabolic. Faint ground-glass opacities in the right upper lobe are not discernibly hypermetabolic.  Lingular scarring noted.  Small bilateral axillary lymph nodes are not significantly hypermetabolic.   Chest:  Minimally high activity noted in the left adrenal gland, stable in 2011 and probably incidental.  No associated mass is seen.  No other significant hypermetabolic abdominal activity is noted.   Abdomen/Pelvis:  No abnormal hypermetabolic activity within the liver, pancreas, adrenal glands, or spleen.  No hypermetabolic lymph nodes in the abdomen or pelvis.   Skeleton:  No focal hypermetabolic activity to suggest skeletal metastasis.   IMPRESSION:   1.  Markedly hypermetabolic right paratracheal adenopathy 2.  The right upper lobe solid nodule, which measures about 0.8 x 0.5 cm, has only borderline hypermetabolic activity with maximum standard uptake value of 2.7. Nevertheless, given the small size of the lesion the appearance is concerning for malignancy. 3.  Emphysema. 4.  Prior left upper lobectomy. 5.  The faint ground-glass nodules in the right upper lobe are not discernibly hypermetabolic.   Original Report Authenticated By: WALTER D. LIEBKEMANN, M.D.         Impression/Plan:  She has a small, irregular, enlarging right upper lobe lesion that is borderline hypermetabolic on PET scan as well as significant paratracheal lymphadenopathy that is markedly hypermetabolic on PET scan. This is highly suspicious for either recurrence of her lung cancer or a new primary lung cancer with mediastinal lymph node metastasis. I recommended that we perform mediastinoscopy with lymph node biopsies to determine if there are mediastinal lymph node metastasis. This will allow us to get enough tissue to make a firm diagnosis and allow further testing if indicated. I discussed the operative procedure with the patient and her daughter. We discussed alternatives, benefits, and risks including but not limited to bleeding, infection, injury to mediastinal structures, and pneumothorax. She understands and would like to proceed as quickly as possible. We have scheduled this for Monday, 10/19/2011.      She has a small, irregular, enlarging right upper lobe lesion that is borderline hypermetabolic on PET scan as well as significant paratracheal lymphadenopathy that is markedly hypermetabolic on PET scan. This is highly suspicious for either recurrence of her lung cancer or a new primary lung cancer with mediastinal lymph node metastasis. I recommended that we perform mediastinoscopy with lymph node biopsies to determine if there are mediastinal lymph node metastasis. This will allow [place] to get enough tissue to make a firm diagnosis and allow further testing if indicated. I discussed the operative procedure with the patient and her daughter. We discussed alternatives, benefits, and risks including but not limited to bleeding, infection, injury to mediastinal structures, and pneumothorax. She understands and would like to proceed as quickly as possible. We have scheduled this for Monday, 10/19/2011.

## 2011-10-18 MED ORDER — DEXTROSE 5 % IV SOLN
1.5000 g | INTRAVENOUS | Status: AC
  Administered 2011-10-19: 1.5 g via INTRAVENOUS
  Filled 2011-10-18: qty 1.5

## 2011-10-19 ENCOUNTER — Ambulatory Visit (HOSPITAL_COMMUNITY): Payer: Medicare Other | Admitting: *Deleted

## 2011-10-19 ENCOUNTER — Encounter (HOSPITAL_COMMUNITY): Payer: Self-pay | Admitting: *Deleted

## 2011-10-19 ENCOUNTER — Ambulatory Visit (HOSPITAL_COMMUNITY)
Admission: RE | Admit: 2011-10-19 | Discharge: 2011-10-19 | Disposition: A | Discharge status: Home / Self Care | Payer: Medicare Other | Source: Ambulatory Visit | Attending: Surgery | Admitting: Surgery

## 2011-10-19 ENCOUNTER — Encounter (HOSPITAL_COMMUNITY)
Admission: RE | Disposition: A | Discharge status: Home / Self Care | Payer: Self-pay | Source: Ambulatory Visit | Attending: Surgery

## 2011-10-19 ENCOUNTER — Ambulatory Visit (HOSPITAL_COMMUNITY): Payer: Medicare Other

## 2011-10-19 DIAGNOSIS — R599 Enlarged lymph nodes, unspecified: Secondary | ICD-10-CM

## 2011-10-19 DIAGNOSIS — J4489 Other specified chronic obstructive pulmonary disease: Secondary | ICD-10-CM | POA: Insufficient documentation

## 2011-10-19 DIAGNOSIS — Z85038 Personal history of other malignant neoplasm of large intestine: Secondary | ICD-10-CM | POA: Insufficient documentation

## 2011-10-19 DIAGNOSIS — K219 Gastro-esophageal reflux disease without esophagitis: Secondary | ICD-10-CM | POA: Insufficient documentation

## 2011-10-19 DIAGNOSIS — I10 Essential (primary) hypertension: Secondary | ICD-10-CM | POA: Insufficient documentation

## 2011-10-19 DIAGNOSIS — D381 Neoplasm of uncertain behavior of trachea, bronchus and lung: Secondary | ICD-10-CM

## 2011-10-19 DIAGNOSIS — R222 Localized swelling, mass and lump, trunk: Secondary | ICD-10-CM | POA: Insufficient documentation

## 2011-10-19 DIAGNOSIS — J449 Chronic obstructive pulmonary disease, unspecified: Secondary | ICD-10-CM | POA: Insufficient documentation

## 2011-10-19 DIAGNOSIS — I2510 Atherosclerotic heart disease of native coronary artery without angina pectoris: Secondary | ICD-10-CM | POA: Insufficient documentation

## 2011-10-19 DIAGNOSIS — Z85118 Personal history of other malignant neoplasm of bronchus and lung: Secondary | ICD-10-CM | POA: Insufficient documentation

## 2011-10-19 DIAGNOSIS — C771 Secondary and unspecified malignant neoplasm of intrathoracic lymph nodes: Secondary | ICD-10-CM | POA: Insufficient documentation

## 2011-10-19 DIAGNOSIS — F3289 Other specified depressive episodes: Secondary | ICD-10-CM | POA: Insufficient documentation

## 2011-10-19 DIAGNOSIS — F17200 Nicotine dependence, unspecified, uncomplicated: Secondary | ICD-10-CM | POA: Insufficient documentation

## 2011-10-19 DIAGNOSIS — F329 Major depressive disorder, single episode, unspecified: Secondary | ICD-10-CM | POA: Insufficient documentation

## 2011-10-19 HISTORY — DX: Chronic obstructive pulmonary disease, unspecified: J44.9

## 2011-10-19 HISTORY — DX: Unspecified osteoarthritis, unspecified site: M19.90

## 2011-10-19 HISTORY — PX: MEDIASTINOSCOPY: SHX5086

## 2011-10-19 HISTORY — DX: Atherosclerotic heart disease of native coronary artery without angina pectoris: I25.10

## 2011-10-19 HISTORY — DX: Shortness of breath: R06.02

## 2011-10-19 LAB — PROTIME-INR
INR: 1.1 (ref 0.00–1.49)
Prothrombin Time: 14.4 seconds (ref 11.6–15.2)

## 2011-10-19 LAB — CBC
HCT: 42.6 % (ref 36.0–46.0)
MCHC: 32.2 g/dL (ref 30.0–36.0)
MCV: 75.8 fL — ABNORMAL LOW (ref 78.0–100.0)
RDW: 15.8 % — ABNORMAL HIGH (ref 11.5–15.5)

## 2011-10-19 LAB — TYPE AND SCREEN

## 2011-10-19 LAB — COMPREHENSIVE METABOLIC PANEL
Albumin: 3.2 g/dL — ABNORMAL LOW (ref 3.5–5.2)
BUN: 11 mg/dL (ref 6–23)
Creatinine, Ser: 0.72 mg/dL (ref 0.50–1.10)
Total Bilirubin: 0.6 mg/dL (ref 0.3–1.2)
Total Protein: 6.8 g/dL (ref 6.0–8.3)

## 2011-10-19 LAB — SURGICAL PCR SCREEN: MRSA, PCR: NEGATIVE

## 2011-10-19 SURGERY — MEDIASTINOSCOPY
Anesthesia: General | Site: Neck | Wound class: Clean

## 2011-10-19 MED ORDER — FENTANYL CITRATE 0.05 MG/ML IJ SOLN
INTRAMUSCULAR | Status: DC | PRN
  Administered 2011-10-19: 50 ug via INTRAVENOUS
  Administered 2011-10-19: 100 ug via INTRAVENOUS
  Administered 2011-10-19: 50 ug via INTRAVENOUS

## 2011-10-19 MED ORDER — 0.9 % SODIUM CHLORIDE (POUR BTL) OPTIME
TOPICAL | Status: DC | PRN
  Administered 2011-10-19: 1000 mL

## 2011-10-19 MED ORDER — OXYCODONE-ACETAMINOPHEN 5-325 MG PO TABS: 1.0000 | ORAL_TABLET | ORAL | Status: DC | PRN

## 2011-10-19 MED ORDER — VECURONIUM BROMIDE 10 MG IV SOLR
INTRAVENOUS | Status: DC | PRN
  Administered 2011-10-19: 1 mg via INTRAVENOUS

## 2011-10-19 MED ORDER — MIDAZOLAM HCL 5 MG/5ML IJ SOLN
INTRAMUSCULAR | Status: DC | PRN
  Administered 2011-10-19: 2 mg via INTRAVENOUS

## 2011-10-19 MED ORDER — PROPOFOL 10 MG/ML IV EMUL
INTRAVENOUS | Status: DC | PRN
  Administered 2011-10-19: 80 mg via INTRAVENOUS

## 2011-10-19 MED ORDER — LIDOCAINE HCL (CARDIAC) 20 MG/ML IV SOLN
INTRAVENOUS | Status: DC | PRN
  Administered 2011-10-19: 10 mg via INTRAVENOUS

## 2011-10-19 MED ORDER — ONDANSETRON HCL 4 MG/2ML IJ SOLN: 4.0000 mg | Freq: Four times a day (QID) | INTRAMUSCULAR | Status: DC | PRN

## 2011-10-19 MED ORDER — ONDANSETRON HCL 4 MG/2ML IJ SOLN
INTRAMUSCULAR | Status: DC | PRN
  Administered 2011-10-19: 4 mg via INTRAVENOUS

## 2011-10-19 MED ORDER — ROCURONIUM BROMIDE 100 MG/10ML IV SOLN
INTRAVENOUS | Status: DC | PRN
  Administered 2011-10-19: 40 mg via INTRAVENOUS
  Administered 2011-10-19: 10 mg via INTRAVENOUS

## 2011-10-19 MED ORDER — HYDROMORPHONE HCL PF 1 MG/ML IJ SOLN
0.2500 mg | INTRAMUSCULAR | Status: DC | PRN
  Administered 2011-10-19: 0.5 mg via INTRAVENOUS

## 2011-10-19 MED ORDER — NEOSTIGMINE METHYLSULFATE 1 MG/ML IJ SOLN
INTRAMUSCULAR | Status: DC | PRN
  Administered 2011-10-19: 4 mg via INTRAVENOUS

## 2011-10-19 MED ORDER — MUPIROCIN 2 % EX OINT
TOPICAL_OINTMENT | CUTANEOUS | Status: AC
  Filled 2011-10-19: qty 22

## 2011-10-19 MED ORDER — GLYCOPYRROLATE 0.2 MG/ML IJ SOLN
INTRAMUSCULAR | Status: DC | PRN
  Administered 2011-10-19: .7 mg via INTRAVENOUS

## 2011-10-19 MED ORDER — ATROPINE SULFATE 1 MG/ML IJ SOLN
INTRAMUSCULAR | Status: DC | PRN
  Administered 2011-10-19: .5 mg via INTRAVENOUS

## 2011-10-19 MED ORDER — LACTATED RINGERS IV SOLN
INTRAVENOUS | Status: DC | PRN
  Administered 2011-10-19: 07:00:00 via INTRAVENOUS

## 2011-10-19 SURGICAL SUPPLY — 39 items
ADH SKN CLS APL DERMABOND .7 (GAUZE/BANDAGES/DRESSINGS) ×2
BLADE SURG 10 STRL SS (BLADE) ×2 IMPLANT
CANISTER SUCTION 2500CC (MISCELLANEOUS) ×2 IMPLANT
CLOTH BEACON ORANGE TIMEOUT ST (SAFETY) ×2 IMPLANT
CONT SPEC 4OZ CLIKSEAL STRL BL (MISCELLANEOUS) ×5 IMPLANT
COVER SURGICAL LIGHT HANDLE (MISCELLANEOUS) ×4 IMPLANT
DERMABOND ADVANCED (GAUZE/BANDAGES/DRESSINGS) ×2
DERMABOND ADVANCED .7 DNX12 (GAUZE/BANDAGES/DRESSINGS) IMPLANT
DRAPE LAPAROTOMY T 102X78X121 (DRAPES) ×2 IMPLANT
ELECT CAUTERY BLADE 6.4 (BLADE) ×2 IMPLANT
ELECT REM PT RETURN 9FT ADLT (ELECTROSURGICAL) ×2
ELECTRODE REM PT RTRN 9FT ADLT (ELECTROSURGICAL) ×1 IMPLANT
GAUZE SPONGE 4X4 16PLY XRAY LF (GAUZE/BANDAGES/DRESSINGS) ×1 IMPLANT
GLOVE BIOGEL PI IND STRL 6.5 (GLOVE) IMPLANT
GLOVE BIOGEL PI INDICATOR 6.5 (GLOVE) ×2
GLOVE EUDERMIC 7 POWDERFREE (GLOVE) ×2 IMPLANT
GOWN PREVENTION PLUS XLARGE (GOWN DISPOSABLE) ×2 IMPLANT
GOWN STRL NON-REIN LRG LVL3 (GOWN DISPOSABLE) ×1 IMPLANT
GOWN STRL REIN XL XLG (GOWN DISPOSABLE) ×1 IMPLANT
HEMOSTAT SURGICEL 2X14 (HEMOSTASIS) IMPLANT
KIT BASIN OR (CUSTOM PROCEDURE TRAY) ×2 IMPLANT
KIT ROOM TURNOVER OR (KITS) ×2 IMPLANT
NS IRRIG 1000ML POUR BTL (IV SOLUTION) ×2 IMPLANT
PACK SURGICAL SETUP 50X90 (CUSTOM PROCEDURE TRAY) ×2 IMPLANT
PAD ARMBOARD 7.5X6 YLW CONV (MISCELLANEOUS) ×4 IMPLANT
PENCIL BUTTON HOLSTER BLD 10FT (ELECTRODE) ×2 IMPLANT
SPONGE GAUZE 4X4 12PLY (GAUZE/BANDAGES/DRESSINGS) ×2 IMPLANT
SPONGE INTESTINAL PEANUT (DISPOSABLE) ×1 IMPLANT
SUT SILK 2 0 TIES 10X30 (SUTURE) IMPLANT
SUT VIC AB 2-0 CT1 27 (SUTURE) ×2
SUT VIC AB 2-0 CT1 TAPERPNT 27 (SUTURE) ×1 IMPLANT
SUT VIC AB 3-0 SH 27 (SUTURE) ×2
SUT VIC AB 3-0 SH 27X BRD (SUTURE) IMPLANT
SUT VICRYL 4-0 PS2 18IN ABS (SUTURE) ×1 IMPLANT
SYRINGE 10CC LL (SYRINGE) ×2 IMPLANT
TOWEL OR 17X24 6PK STRL BLUE (TOWEL DISPOSABLE) ×2 IMPLANT
TOWEL OR 17X26 10 PK STRL BLUE (TOWEL DISPOSABLE) ×2 IMPLANT
TUBE CONNECTING 12X1/4 (SUCTIONS) ×3 IMPLANT
WATER STERILE IRR 1000ML POUR (IV SOLUTION) ×1 IMPLANT

## 2011-10-19 NOTE — Anesthesia Postprocedure Evaluation (Signed)
Anesthesia Post Note  Patient: [name]  Procedure(s) Performed: Procedure(s) (LRB): MEDIASTINOSCOPY (N/A)  Anesthesia type: General  Patient location: PACU  Post pain: Pain level controlled and Adequate analgesia  Post assessment: Post-op Vital signs reviewed, Patient's Cardiovascular Status Stable, Respiratory Function Stable, Patent Airway and Pain level controlled  Last Vitals:  Filed Vitals:   10/19/11 0945  BP:   Pulse: 58  Temp:   Resp: 13    Post vital signs: Reviewed and stable  Level of consciousness: awake, alert  and oriented  Complications: No apparent anesthesia complications

## 2011-10-19 NOTE — Anesthesia Procedure Notes (Signed)
Performed by: Elver Stadler TODD     

## 2011-10-19 NOTE — Transfer of Care (Signed)
Immediate Anesthesia Transfer of Care Note  Patient: Lauren [name]  Procedure(s) Performed: Procedure(s) (LRB): MEDIASTINOSCOPY (N/A)  Patient Location: PACU  Anesthesia Type: General  Level of Consciousness: awake, oriented, patient cooperative and responds to stimulation  Airway & Oxygen Therapy: Patient Spontanous Breathing and Patient connected to face mask oxygen  Post-op Assessment: Report given to PACU RN, Post -op Vital signs reviewed and stable and Patient moving all extremities X 4  Post vital signs: Reviewed and stable  Complications: No apparent anesthesia complications

## 2011-10-19 NOTE — Brief Op Note (Signed)
10/19/2011  9:13 AM  PATIENT:  Lauren [name]  61 y.o. female  PRE-OPERATIVE DIAGNOSIS:  Right upper lobe lesion and mediastinal adenopathy  POST-OPERATIVE DIAGNOSIS:  Right upper lobe lesion and mediastinal adenopathy  PROCEDURE:  Procedure(s) (LRB): MEDIASTINOSCOPY (N/A)  SURGEON:  Surgeon(s) and Role:    * Alleen Borne, MD - Primary  PHYSICIAN ASSISTANT: none  ASSISTANTS: none   ANESTHESIA:   general  EBL:  Total I/O In: 500 [I.V.:500] Out: -   BLOOD ADMINISTERED:none  DRAINS: none   LOCAL MEDICATIONS USED:  NONE  SPECIMEN:  Source of Specimen:  R4 lymph nodes  DISPOSITION OF SPECIMEN:  PATHOLOGY   Frozen Section:  Metastatic non-small cell carcinoma   COUNTS:  YES    PLAN OF CARE: Discharge to home after PACU  PATIENT DISPOSITION:  PACU - hemodynamically stable.   Delay start of Pharmacological VTE agent (>24hrs) due to surgical blood loss or risk of bleeding: not applicable

## 2011-10-19 NOTE — Anesthesia Preprocedure Evaluation (Signed)
Anesthesia Evaluation  Patient identified by MRN, date of birth, ID band Patient awake    Reviewed: Allergy & Precautions, H&P , NPO status , Patient's Chart, lab work & pertinent test results  Airway Mallampati: II  Neck ROM: full    Dental   Pulmonary shortness of breath, COPDCurrent Smoker,  Lung CA         Cardiovascular hypertension, + CAD     Neuro/Psych PSYCHIATRIC DISORDERS Depression    GI/Hepatic GERD-  ,  Endo/Other    Renal/GU      Musculoskeletal   Abdominal   Peds  Hematology   Anesthesia Other Findings   Reproductive/Obstetrics                           Anesthesia Physical Anesthesia Plan  ASA: III  Anesthesia Plan: General   Post-op Pain Management:    Induction: Intravenous  Airway Management Planned: Oral ETT  Additional Equipment:   Intra-op Plan:   Post-operative Plan: Extubation in OR  Informed Consent: I have reviewed the patients History and Physical, chart, labs and discussed the procedure including the risks, benefits and alternatives for the proposed anesthesia with the patient or authorized representative who has indicated his/her understanding and acceptance.     Plan Discussed with: CRNA and Surgeon  Anesthesia Plan Comments:         Anesthesia Quick Evaluation

## 2011-10-19 NOTE — Preoperative (Signed)
Beta Blockers   Reason not to administer Beta Blockers:Not Applicable, took Bystolic this AM at 0430

## 2011-10-19 NOTE — Discharge Instructions (Signed)
May shower.  Resume normal activity.  May return to driving 4/0/10.

## 2011-10-19 NOTE — Op Note (Signed)
NAME:  ADAMARY, SAVARY NO.:  [id number]  MEDICAL RECORD NO.:  [id number]  LOCATION:  [facility]                         FACILITY:  [facility]  PHYSICIAN:  [name], M.D.     DATE OF BIRTH:  March 29, 1951  DATE OF PROCEDURE:  10/19/2011 DATE OF DISCHARGE:                              OPERATIVE REPORT   PREOPERATIVE DIAGNOSIS:  Right upper lobe lung lesion with mediastinal adenopathy.  POSTOPERATIVE DIAGNOSIS:  Right upper lobe lung lesion with mediastinal adenopathy.  OPERATIVE PROCEDURE:  Mediastinoscopy with lymph node biopsy.  SURGEON:  [name], M.D.  ANESTHESIA:  General endotracheal.  CLINICAL HISTORY:  This patient is a 61 year old woman who underwent left thoracotomy with left upper lobectomy by me on June 16, 2010, for a T1A, N0, well-differentiated adenocarcinoma of the lung.  She has been followed by Dr. Gaylyn Rong.  She has continued to smoke about 1 pack of cigarettes per day since surgery.  She had a followup CT scan done in November 2012, this showed a new small lesion in the right upper lobe as well as a new small lesion in the left upper lobe.  A followup scan done in May 2013 showed interval enlargement of the nodule in the posterior aspect of the right upper lobe measuring 1.0 x 0.7 cm.  This lesion has somewhat ill-defined margins with early speculations.  She also had numerous enlarged mediastinal lymph nodes.  The largest of which was located in the low right peritracheal region measuring 2.1 x 2.8 cm. She underwent a PET scan which showed markedly hypermetabolic right peritracheal lymphadenopathy as well as borderline hypermetabolic activity in the small right upper lobe lung nodule.  After review of the scans, I felt that most likely the patient had a new right upper lobe primary with mediastinal lymph node metastasis.  I felt the mediastinoscopy would be the best method for determining if the mediastinal lymph nodes were positive for  metastatic cancer.  I discussed the operative procedure with the patient and her daughter including the alternative of performing endobronchial ultrasound for diagnosis.  I felt the mediastinoscopy would give [place] more tissue and a greater chance of making a firm diagnosis and the ability to perform further testing if needed.  I discussed the benefits and risks of surgery including, but not limited to, bleeding, injury to mediastinal structures, pneumothorax, and she understood and agreed to proceed.  OPERATIVE PROCEDURE:  The patient was taken to operative room, placed on the table in supine position.  After induction of general endotracheal anesthesia, the neck was extended with a roll beneath the shoulders. The neck and chest were prepped with Betadine soap and solution, draped in the usual sterile manner.  Time-out was taken.  Proper patient, proper operation were confirmed with nursing and anesthesia staff after reviewing the CT scans in the operating room.  Then, a 2 cm incision was made transversely about 1 fingerbreadth above the sternal notch along the natural skin crease.  This was carried down through the subcutaneous tissue using electrocautery.  The strap muscles were separated in the midline and retracted laterally.  The trachea was exposed and a pretracheal plane was bluntly developed down in the mediastinum.  The mediastinoscope  was inserted.  The innominate artery was retracted anteriorly with the scope.  As the scope was advanced down along the trachea, the enlarged lymph nodes and the right peritracheal region were identified.  These were firm.  Biopsies were taken and sent to pathology.  Frozen section was called to the operating room by Dr. Arther Abbott and showed metastatic non-small cell carcinoma.  There was complete hemostasis.  The mediastinoscope was withdrawn from the patient.  Strap muscles were reapproximated with interrupted 3-0 Vicryl sutures.  The platysma muscle  was reapproximated with interrupted 3-0 Vicryl suture and the skin was closed with a 4-0 Vicryl subcuticular skin stitch. Dermabond was placed over the incision.  The sponge, needle, and instrument counts were correct according to the scrub nurse.  The patient was then awakened, extubated, and transferred to the postanesthesia care unit in satisfactory and stable condition.     [name], M.D.     BB/MEDQ  D:  10/19/2011  T:  10/19/2011  Job:  [id number]

## 2011-10-20 ENCOUNTER — Telehealth: Payer: Self-pay | Admitting: Oncology

## 2011-10-20 ENCOUNTER — Encounter (HOSPITAL_COMMUNITY): Payer: Self-pay | Admitting: Surgery

## 2011-10-20 NOTE — Telephone Encounter (Signed)
pt called to schedule appt per staff message from Dr. Gaylyn Rong scheduled pt for appt on 06/06

## 2011-10-22 ENCOUNTER — Telehealth: Payer: Self-pay | Admitting: Oncology

## 2011-10-22 ENCOUNTER — Ambulatory Visit (HOSPITAL_BASED_OUTPATIENT_CLINIC_OR_DEPARTMENT_OTHER): Payer: Medicare Other | Admitting: Oncology

## 2011-10-22 ENCOUNTER — Encounter: Payer: Self-pay | Admitting: Oncology

## 2011-10-22 ENCOUNTER — Telehealth: Payer: Self-pay | Admitting: *Deleted

## 2011-10-22 ENCOUNTER — Encounter: Payer: Self-pay | Admitting: Psychiatry

## 2011-10-22 VITALS — BP 146/80 | HR 66 | Temp 97.3°F | Ht 64.0 in | Wt 169.0 lb

## 2011-10-22 DIAGNOSIS — F411 Generalized anxiety disorder: Secondary | ICD-10-CM

## 2011-10-22 DIAGNOSIS — C3490 Malignant neoplasm of unspecified part of unspecified bronchus or lung: Secondary | ICD-10-CM

## 2011-10-22 DIAGNOSIS — C7800 Secondary malignant neoplasm of unspecified lung: Secondary | ICD-10-CM

## 2011-10-22 DIAGNOSIS — I10 Essential (primary) hypertension: Secondary | ICD-10-CM

## 2011-10-22 DIAGNOSIS — R634 Abnormal weight loss: Secondary | ICD-10-CM

## 2011-10-22 HISTORY — DX: Abnormal weight loss: R63.4

## 2011-10-22 MED ORDER — ONDANSETRON HCL 8 MG PO TABS: ORAL_TABLET | ORAL | Status: DC

## 2011-10-22 MED ORDER — PROCHLORPERAZINE MALEATE 10 MG PO TABS: 10.0000 mg | ORAL_TABLET | Freq: Four times a day (QID) | ORAL | Status: DC | PRN

## 2011-10-22 NOTE — Telephone Encounter (Signed)
gv pt appt for mri on 06/11 at [facility]. scheduled appt with Dr. Michell Heinrich for 06/12 @ 3pm.  pt will call me to scheduled chemo class.  sent email to michels to schedule chemo appt 06/19. pt will pick schecdule on 06/12

## 2011-10-22 NOTE — Telephone Encounter (Signed)
Gv pt appt for mri for 06/11 @ [facility]. scheduled appt with Dr. Homero Fellers 06/12.  called pt and informed her of appt on 06/17 and at that time she can pick up schedule for june and july2013.

## 2011-10-22 NOTE — Telephone Encounter (Signed)
Per staff message from Eunice, I have scheduled treatment appts.  JMW  

## 2011-10-22 NOTE — Progress Notes (Signed)
[facility]  [phone] [phone]   OFFICE PROGRESS NOTE   Cc:  Ardyth Gal, MD, MD   PAST DIAGNOSIS AND PAST THERAPY:  1. 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 Her post resection surveillance colonoscopy onOctober 14, 2009, by Dr. Christella Hartigan was negative. 2. pT1a, pN0 well differentiated adenocarcinoma of lung; s/p left upper lobectomy on 06/16/2010.  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   CURRENT THERAPY:  Pending evaluation.   INTERVAL HISTORY: Lauren [name] 61 y.o. female returns for regular follow up with her daughter to discuss result of recent biopsy. She reports having fatigue.  She is still independent of activities of daily living.  She has decreased appetite due to her anxiety.  She had swelling and pain in the superior mediastinal area from recent biopsy which are resolving.  She has mild productive cough which she attributes to smoker cough.  She claimed that she is only few a few cigarettes daily.   Patient denies headache, visual changes, confusion, drenching night sweats, palpable lymph node swelling, mucositis, odynophagia, dysphagia, nausea vomiting, jaundice, chest pain, palpitation, shortness of breath, dyspnea on exertion, productive cough, gum bleeding, epistaxis, hematemesis, hemoptysis, abdominal pain, abdominal swelling, early satiety, melena, hematochezia, hematuria, skin rash, spontaneous bleeding, joint swelling, joint pain, heat or cold intolerance, bowel bladder incontinence, back pain, focal motor weakness, paresthesia.   Past Medical History  Diagnosis Date  . Depression   . Hyperlipidemia   . Hypertension   . Colon cancer   . Lung cancer     PET scan 04/28/2010; primary: increase in size 02/2010  . Lung  nodule     FNA ordered for 04/02/10 by Jaylene Arrowood>pos Ca  . Chronic folliculitis     of groin  . Fibrocystic breast changes   . Hypokalemia   . Syncope   . Mitral valve prolapse   . Family history of trichomonal vaginitis 05/2005  . Postmenopausal   . Depressive disorder   . Hypercholesterolemia   . GERD (gastroesophageal reflux disease)   . Cerebral aneurysm   . Back pain   . Shortness of breath   . COPD (chronic obstructive pulmonary disease)   . Coronary artery disease   . Arthritis   . Weight loss 10/22/2011    Past Surgical History  Procedure Date  . Colon surgery   . Tubal ligation   . Lung lobectomy 2012  . Hernia repair   . Breast surgery     Bil lumpectomy  . Back surgery   . Mediastinoscopy 10/19/2011    Procedure: MEDIASTINOSCOPY;  Surgeon: Alleen Borne, MD;  Location: [facility] OR;  Service: Thoracic;  Laterality: N/A;    Current Outpatient Prescriptions  Medication Sig Dispense Refill  . albuterol (PROVENTIL HFA;VENTOLIN HFA) 108 (90 BASE) MCG/ACT inhaler Inhale 2 puffs into the lungs every 6 (six) hours as needed.      Marland Kitchen aspirin 81 MG EC tablet Take 81 mg by mouth daily.      . cholecalciferol (VITAMIN D) 1000 UNITS tablet Take 1,000 Units by mouth daily.      . cloNIDine (CATAPRES) 0.1 MG tablet Take 0.1 mg by mouth at bedtime.      . diclofenac sodium (VOLTAREN) 1 % GEL Apply 1 application topically 4 (four) times daily. As needed for pain      .  fish oil-omega-3 fatty acids 1000 MG capsule Take 2,400 g by mouth daily. Also contains FLAX BORAGE      . hydrochlorothiazide (HYDRODIURIL) 25 MG tablet Take 25 mg by mouth daily.      . nebivolol (BYSTOLIC) 10 MG tablet Take 10 mg by mouth daily.      . nitroGLYCERIN (NITROSTAT) 0.4 MG SL tablet Place 0.4 mg under the tongue every 5 (five) minutes as needed.      Marland Kitchen omeprazole (PRILOSEC) 20 MG capsule Take 20 mg by mouth daily.        . pravastatin (PRAVACHOL) 40 MG tablet Take 80 mg by mouth daily.      . traMADol (ULTRAM) 50  MG tablet Take 50 mg by mouth every 6 (six) hours as needed. As needed for pain.      Marland Kitchen zolpidem (AMBIEN) 5 MG tablet Take 5 mg by mouth at bedtime as needed.      . ondansetron (ZOFRAN) 8 MG tablet Take 1 tab two times a day starting the day after chemo for 3 days. Then take 1 tab two times a day as needed for nausea or vomiting.  30 tablet  1  . prochlorperazine (COMPAZINE) 10 MG tablet Take 1 tablet (10 mg total) by mouth every 6 (six) hours as needed (Nausea or vomiting).  30 tablet  1  . DISCONTD: buPROPion (WELLBUTRIN XL) 150 MG 24 hr tablet Take 1 tablet (150 mg total) by mouth 2 (two) times daily.  60 tablet  5    ALLERGIES:  is allergic to lisinopril.  REVIEW OF SYSTEMS:  The rest of the 14-point review of system was negative.   Filed Vitals:   10/22/11 0915  BP: 146/80  Pulse: 66  Temp: 97.3 F (36.3 C)   Wt Readings from Last 3 Encounters:  10/22/11 169 lb (76.658 kg)  10/16/11 167 lb (75.751 kg)  10/16/11 167 lb (75.751 kg)   ECOG Performance status: 1  PHYSICAL EXAMINATION:   General: thin-appearing woman, in no acute distress. Eyes: no scleral icterus. ENT: There were no oropharyngeal lesions. Neck was without thyromegaly.  Mediastinal biopsy site has healed up completely without erythema, pain, purulent discharge.  Lymphatics: Negative cervical, supraclavicular or axillary adenopathy. Respiratory: lungs were clear bilaterally without wheezing or crackles. Cardiovascular: Regular rate and rhythm, S1/S2, without murmur, rub or gallop. There was no pedal edema. GI: abdomen was soft, flat, nontender, nondistended, without organomegaly. Muscoloskeletal: no spinal tenderness of palpation of vertebral spine. Skin exam was without echymosis, petichae. Neuro exam was nonfocal. CN II-XII were intact.  There was no dysmetria. Patient was able to get on and off exam table without assistance. Gait was normal. Patient was alerted and oriented. Attention was good. Language was appropriate.  Mood was normal without depression. Speech was not pressured. Thought content was not tangential.    LABORATORY/RADIOLOGY DATA:  Lab Results  Component Value Date   WBC 8.6 10/19/2011   HGB 13.7 10/19/2011   HCT 42.6 10/19/2011   PLT 224 10/19/2011   GLUCOSE 109* 10/19/2011   CHOL 190 12/08/2010   TRIG 104 12/08/2010   HDL 39* 12/08/2010   LDLDIRECT 111* 02/27/2010   LDLCALC 130* 12/08/2010   ALKPHOS 104 10/19/2011   ALT 5 10/19/2011   AST 9 10/19/2011   NA 141 10/19/2011   K 3.0* 10/19/2011   CL 101 10/19/2011   CREATININE 0.72 10/19/2011   BUN 11 10/19/2011   CO2 28 10/19/2011   INR 1.10 10/19/2011  Ct Chest W Contrast  09/28/2011  *RADIOLOGY REPORT*  Clinical Data:  History of lung cancer.  History of colon cancer status post colon resection.  Shortness of breath.  Cough.  CT CHEST WITH CONTRAST  Technique:  Multidetector CT imaging of the chest was performed dur ing intravenous contrast   administration.  Contrast:  80 ml of Omnipaque-300.  Comparison:  Chest CT 04/01/2011.  Findings:  Mediastinum: Heart size is normal. There is no significant pericardial fluid, thickening or pericardial calcification.  There are numerous enlarged mediastinal lymph nodes, the largest of which is in the low right paratracheal position measuring 2.1 x 2.8 cm (image 21 of series 2).  No definite hilar adenopathy identified. The esophagus is unremarkable in appearance. There is atherosclerosis of the thoracic aorta, the great vessels of the mediastinum and the coronary arteries, including calcified atherosclerotic plaque in the left main, left anterior descending and left circumflex coronary arteries. .  Lungs/Pleura: Postoperative changes of left upper lobectomy are again noted.  There are areas of pleural-based scarring in the periphery of the left lower lobe (unchanged). Compared to the prior exam from 04/01/2011 there has been interval enlargement of a nodule in the posterior aspect of the right upper lobe (image 14 of series 5)  which currently measures 1.0 x 0.7 cm (previously 5 x 4 mm).  This lesion has somewhat ill-defined margins with what appeared to be some early spiculations.  Also on image 14 more laterally there is an area of ill-defined ground-glass attenuation which is similar to prior examinations.  There is a background of mild centrilobular emphysema and mild diffuse bronchial wall thickening.  There are a few other tiny 2-3 mm pulmonary nodules and some scattered thick-walled cysts (best demonstrated on images 13 and 14 of series 5 in the anterior aspect of the right upper lobe), which could suggest a smoking related disease such as pulmonary Langerhans cell histiocytosis. No consolidative airspace disease.  No pleural effusions.  Upper Abdomen: Atherosclerosis in the visualized vasculature.  Sub centimeter low attenuation lesion in the posterior aspect of the upper pole of the left kidney is too small to characterize, but is unchanged compared to the prior examination and is favored to represent a small cyst.  Musculoskeletal: Post thoracotomy changes noted in the left hemithorax. There are no aggressive appearing lytic or blastic lesions noted in the visualized portions of the skeleton.  IMPRESSION:  1.  Interval enlargement of what is now an ill-defined 1.0 x 0.7 cm slightly spiculated right upper lobe pulmonary nodule, with development of right paratracheal lymphadenopathy, as above. Findings are suspicious for potential malignancy, and further evaluation with PET CT and/or CT-guided percutaneous needle biopsy of the right upper lobe nodule should be considered. 2.  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 Counseling for smoking cessation is highly recommended. 3. Atherosclerosis, including left main and two-vessel coronary artery disease. Please note that although the presence of coronary artery  calcium documents the presence of coronary artery disease, the severity of this disease and any potential stenosis cannot be assessed on this non-gated CT examination.  Assessment for potential risk factor modification, dietary therapy or pharmacologic therapy may be warranted, if clinically indicated. 4.  Status post left upper lobectomy.  This was made a call report.  Original Report Authenticated By: Florencia Reasons, M.D.   Nm Pet Image Restag (ps) Skull Base To Thigh  10/08/2011  *  RADIOLOGY REPORT*  Clinical Data:  Subsequent treatment strategy for lung cancer and colon cancer.  NUCLEAR MEDICINE PET WHOLE BODY  Fasting Blood Glucose:  99  Technique:  19.5 mCi F-18 FDG was injected intravenously. CT data was obtained and used for attenuation correction and anatomic localization only.  (This was not acquired as a diagnostic CT examination.) Additional exam technical data entered on technologist worksheet.  Comparison:  04/28/2010; 09/28/2011  Findings:  Head/Neck:  Right upper paratracheal node maximum standard uptake value is 20.4.  The small nodule posteriorly in the right upper lobe has a standard uptake value of 2.7, borderline hypermetabolic. Faint ground-glass opacities in the right upper lobe are not discernibly hypermetabolic.  Lingular scarring noted.  Small bilateral axillary lymph nodes are not significantly hypermetabolic.  Chest:  Minimally high activity noted in the left adrenal gland, stable in 2011 and probably incidental.  No associated mass is seen.  No other significant hypermetabolic abdominal activity is noted.  Abdomen/Pelvis:  No abnormal hypermetabolic activity within the liver, pancreas, adrenal glands, or spleen.  No hypermetabolic lymph nodes in the abdomen or pelvis.  Skeleton:  No focal hypermetabolic activity to suggest skeletal metastasis.  IMPRESSION:  1.  Markedly hypermetabolic right paratracheal adenopathy 2.  The right upper lobe solid nodule, which measures about 0.8 x  0.5 cm, has only borderline hypermetabolic activity with maximum standard uptake value of 2.7. Nevertheless, given the small size of the lesion the appearance is concerning for malignancy. 3.  Emphysema. 4.  Prior left upper lobectomy. 5.  The faint ground-glass nodules in the right upper lobe are not discernibly hypermetabolic.  Original Report Authenticated By: Dellia Cloud, M.D.     ASSESSMENT AND PLAN:   1. History of stage I non-small cell lung cancer, adenocarcinoma in 2012: s/p left lobectomy. She now has new nodules in right upper lobe. She was evaluated by CV[facility] with mediastinoscopy this week.  Pathology was again adenocarcinoma.   A.  Issue:  Either stage III or metastatic nonsmall cell lung cancer.  It is not clear with this presentation.  -Stage III if this is considered a new lung cancer (still not resectable given medistinal node). - Stage IV if this is considered recurrent/spread of the previous left upper lobe lung cancer now spread to the right lung and node.  B.  Recommendations: - Brain MRI to rule out brain met.  - Chemo +/- radiation (depending on what stage one considers this cancer to be). - Referral to Radiation Oncologist to see if radiation is appropriate (if they think that this is stage III).  - If chemoradiation:  Then daily radiation and weekly chemo Carboplatin/Taxol for the duration of radiation.  - If Rad Onc think that this is stage IV, then my recommendation will be chemotherapy alone without radiation, then once every 3 weeks chemotherapy x 4 cycles; followed by maintenance chemotherapy until disease progression.   2. History of colon cancer: She has no evidence of recurrent or metastatic disease for her colon cancer. Surveillance for her history of colon cancer entails once-a-year CT scan for 5 years' total, with the next CT abdomen in November 2013. She had a surveillance colonoscopy in October 2012 by Dr Christella Hartigan, which showed polyps. Per his  recommendation, the next colonoscopy is due in Oct 2015 but sooner if she has symptoms.   3. Smoking: she again is trying her best to give up.  She had no success with medications in the past.   4. Hypertension: She  is on HCTZ and clonidine per PCP.   5. Hypercholesterolemia: She is on pravastatin per PCP.   6. Depression/anxiety: She is on clonidine, citalopram per PCP.  I referred her to Dr. Noe Gens for further evaluation and therapy per patient's request.   7. Anorexia:  Due to new diagnosis of recurrent lung cancer.  I referred her to nutrition per her request.    8. Follow up: with me on 11/02/2011 to go over final treatment plan.  We'll tentatively start chemo on 11/04/2011.      The length of time of the face-to-face encounter was 40 minutes. More than 50% of time was spent counseling and coordination of care.

## 2011-10-22 NOTE — Patient Instructions (Addendum)
A.  Issue:  Either stage III or metastatic nonsmall cell lung cancer.   -Stage III if this is considered a new lung cancer (still not resectable given the location of the lymph node). - Stage IV if this is considered recurrent/spread of the previous left upper lobe lung cancer now spread to the right lung and node.  B.  Recommendations: - Brain MRI to ensure that brain is OK - Chemo +/- radiation (depending on what stage one considers this cancer to be). - Referral to Radiation Oncologist to see if radiation is appropriate.  - If chemoradiation:  Then daily radiation and weekly chemo Carboplatin/Taxol for the duration of radiation.  - If chemotherapy alone without radiation, then once every 3 weeks chemotherapy x 4 cycles; followed by maintenance chemotherapy until disease progression.

## 2011-10-26 ENCOUNTER — Encounter: Payer: Self-pay | Admitting: Psychiatry

## 2011-10-26 ENCOUNTER — Ambulatory Visit (INDEPENDENT_AMBULATORY_CARE_PROVIDER_SITE_OTHER): Payer: 59 | Admitting: Psychiatry

## 2011-10-26 DIAGNOSIS — F0630 Mood disorder due to known physiological condition, unspecified: Secondary | ICD-10-CM

## 2011-10-26 DIAGNOSIS — F17200 Nicotine dependence, unspecified, uncomplicated: Secondary | ICD-10-CM

## 2011-10-26 NOTE — Progress Notes (Signed)
10-26-2011  Patient returns to therapy after three years.  She is currently facing chemo and radiation treatment for new cancers.  She wants help with his tobacco addiction and support as she undergoes treatment.  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 Next appointment 11-03-2011.

## 2011-10-27 ENCOUNTER — Ambulatory Visit (HOSPITAL_COMMUNITY)
Admission: RE | Admit: 2011-10-27 | Discharge: 2011-10-27 | Disposition: A | Discharge status: Home / Self Care | Payer: Medicare Other | Source: Ambulatory Visit | Attending: Oncology | Admitting: Oncology

## 2011-10-27 DIAGNOSIS — I6789 Other cerebrovascular disease: Secondary | ICD-10-CM | POA: Insufficient documentation

## 2011-10-27 DIAGNOSIS — C3490 Malignant neoplasm of unspecified part of unspecified bronchus or lung: Secondary | ICD-10-CM

## 2011-10-27 DIAGNOSIS — D352 Benign neoplasm of pituitary gland: Secondary | ICD-10-CM | POA: Insufficient documentation

## 2011-10-27 DIAGNOSIS — G930 Cerebral cysts: Secondary | ICD-10-CM | POA: Insufficient documentation

## 2011-10-27 MED ORDER — GADOBENATE DIMEGLUMINE 529 MG/ML IV SOLN
15.0000 mL | Freq: Once | INTRAVENOUS | Status: AC | PRN
  Administered 2011-10-27: 15 mL via INTRAVENOUS

## 2011-10-28 ENCOUNTER — Ambulatory Visit
Admission: RE | Admit: 2011-10-28 | Discharge: 2011-10-28 | Disposition: A | Discharge status: Home / Self Care | Payer: Medicare Other | Source: Ambulatory Visit | Attending: Radiation Oncology | Admitting: Radiation Oncology

## 2011-10-28 ENCOUNTER — Encounter: Payer: Self-pay | Admitting: Radiation Oncology

## 2011-10-28 VITALS — BP 118/68 | HR 73 | Temp 98.6°F | Resp 18 | Ht 64.0 in | Wt 165.0 lb

## 2011-10-28 DIAGNOSIS — C3490 Malignant neoplasm of unspecified part of unspecified bronchus or lung: Secondary | ICD-10-CM

## 2011-10-28 DIAGNOSIS — C189 Malignant neoplasm of colon, unspecified: Secondary | ICD-10-CM | POA: Insufficient documentation

## 2011-10-28 DIAGNOSIS — J4489 Other specified chronic obstructive pulmonary disease: Secondary | ICD-10-CM | POA: Insufficient documentation

## 2011-10-28 DIAGNOSIS — K219 Gastro-esophageal reflux disease without esophagitis: Secondary | ICD-10-CM | POA: Insufficient documentation

## 2011-10-28 DIAGNOSIS — Z510 Encounter for antineoplastic radiation therapy: Secondary | ICD-10-CM | POA: Insufficient documentation

## 2011-10-28 DIAGNOSIS — I10 Essential (primary) hypertension: Secondary | ICD-10-CM | POA: Insufficient documentation

## 2011-10-28 DIAGNOSIS — I2510 Atherosclerotic heart disease of native coronary artery without angina pectoris: Secondary | ICD-10-CM | POA: Insufficient documentation

## 2011-10-28 DIAGNOSIS — Z87898 Personal history of other specified conditions: Secondary | ICD-10-CM | POA: Insufficient documentation

## 2011-10-28 DIAGNOSIS — J449 Chronic obstructive pulmonary disease, unspecified: Secondary | ICD-10-CM | POA: Insufficient documentation

## 2011-10-28 NOTE — Progress Notes (Signed)
61 year old female.   Hx.of adenocarcinoma of sigmoid colon s/p colectomy in 2008. Also, well differentiated adenocarcinoma of left upper lobe s/p lobectomy.  Referred by Dr. Gaylyn Rong to Dr. Michell Heinrich for consideration of radiation therapy. Issue at hand is is this stage III or metastatic nonsmall cell lung ca.   C/o fatigue, decreased appetite, and mild productive cough  [id number] No indication of a pacemaker No hx of radiation therapy

## 2011-10-28 NOTE — Progress Notes (Signed)
Please see Edythe Clarity, RN's progress note under physician encounter.

## 2011-10-28 NOTE — Progress Notes (Signed)
HERE FOR CONSULT RIGHT LUNG  CA.  HX OF LYMPHOMA,  COLON CA, LEFT LUNG WITH LOBECTOMY.  HAD MRI  6/11, RESULTS ON FRONT OF CHART.  NO C/O TODAY .  DAUGHTER WITH HER.

## 2011-10-29 ENCOUNTER — Ambulatory Visit
Admission: RE | Admit: 2011-10-29 | Discharge: 2011-10-29 | Disposition: A | Discharge status: Home / Self Care | Payer: Medicare Other | Source: Ambulatory Visit | Attending: Radiation Oncology | Admitting: Radiation Oncology

## 2011-10-29 ENCOUNTER — Encounter: Payer: Self-pay | Admitting: Radiation Oncology

## 2011-10-30 NOTE — Progress Notes (Signed)
Met with patient to discuss RO billing. Pt has been approved 100% Indigent through [facility]. Pt advised today she needs assistance with transportation (gas) and meds. [employer] referral will be sent today and gave pt a green card to use at [facility].  EPP valid dates: 12/25/2010-12/25/2011   Dx: 162.9 Lung, Nos  Attending Rad: Dr. Iona Hansen Tx: [id number] Extrl Beam

## 2011-11-01 ENCOUNTER — Other Ambulatory Visit: Payer: Self-pay | Admitting: Oncology

## 2011-11-01 NOTE — Patient Instructions (Addendum)
A.  Plan to proceed with concurrent chemoradiation:  - Radiation is daily Mon-Friday for about 5 weeks.   - Chemo once a week:  Combination of carboplatin and taxol; for the duration of radiation.  - Potential side effects of this chemo regimen include but not limited to mouth sore, low blood count, infection, infusion reaction, numbness and tingling of fingers/toes, bone and muscle pain, inflammation of the lungs.  - To assess disease response, we will repeat CT scan about 1 month out from the finish of therapy.   B.  To get ready for chemoradiation:  - Chem class (1 time class) before 11/16/11  - Pick up nausea medications (Compazine and Zofran) from your pharmacy  - Start chemoradiation on 11/16/11.    - We will see you back on 11/23/11.

## 2011-11-02 ENCOUNTER — Telehealth: Payer: Self-pay | Admitting: *Deleted

## 2011-11-02 ENCOUNTER — Telehealth: Payer: Self-pay | Admitting: Oncology

## 2011-11-02 ENCOUNTER — Ambulatory Visit (HOSPITAL_BASED_OUTPATIENT_CLINIC_OR_DEPARTMENT_OTHER): Payer: Medicare Other | Admitting: Oncology

## 2011-11-02 ENCOUNTER — Other Ambulatory Visit: Payer: Medicare Other | Admitting: Lab

## 2011-11-02 ENCOUNTER — Ambulatory Visit: Payer: Medicare Other | Admitting: Nutrition

## 2011-11-02 VITALS — BP 129/76 | HR 73 | Temp 98.1°F | Ht 64.0 in | Wt 162.9 lb

## 2011-11-02 DIAGNOSIS — E46 Unspecified protein-calorie malnutrition: Secondary | ICD-10-CM

## 2011-11-02 DIAGNOSIS — R630 Anorexia: Secondary | ICD-10-CM

## 2011-11-02 DIAGNOSIS — C3490 Malignant neoplasm of unspecified part of unspecified bronchus or lung: Secondary | ICD-10-CM

## 2011-11-02 DIAGNOSIS — C187 Malignant neoplasm of sigmoid colon: Secondary | ICD-10-CM

## 2011-11-02 DIAGNOSIS — C3410 Malignant neoplasm of upper lobe, unspecified bronchus or lung: Secondary | ICD-10-CM

## 2011-11-02 NOTE — Telephone Encounter (Signed)
Gave pt appt for chemo class, with ML and MD, emailed Marcelino Duster regarding chemo

## 2011-11-02 NOTE — Telephone Encounter (Signed)
Per staff message I have scheduled appts. JMW  

## 2011-11-02 NOTE — Assessment & Plan Note (Signed)
Lauren [name] is a 61 year old female patient of Dr. Gaylyn Rong and Lauren [name] diagnosed with recurrent metastatic non-small cell lung cancer.  MEDICAL HISTORY INCLUDES:  Depression, hyperlipidemia, hypertension, colon cancer, GERD, COPD, CAD, and arthritis.  MEDICATIONS INCLUDE:  Vitamin D, omega-3 fatty acids, Prilosec, Zofran and Compazine.  LABS:  Potassium of 3.0, glucose 109 on June 3rd.  HEIGHT:  64 inches. WEIGHT:  162.9 pounds documented June 17. USUAL BODY WEIGHT:  160-165 pounds. BMI:  27.95.  The patient reports that she typically eats 1 meal a day.  She consumes a lot of soft drinks.  She also complains of constipation.  Her weight is within a normal range for her, but her weight does tend to fluctuate a lot.  NUTRITION DIAGNOSIS:  Food and nutrition related knowledge deficit related to recurrent metastatic cancer and associated treatments as evidenced by no prior need for nutrition related information.  INTERVENTION:  I have educated Ms. Stickley, her daughter, and sister on the importance of increasing oral intake to 5-6 times a day utilizing higher calorie higher protein foods to promote weight maintenance during treatment.  I have discussed the importance of protein foods and how they will play a role during her treatment.  I have encouraged her to explore oral nutrition supplements such as Acupuncturist, Boost or Ensure.  I provided her with samples of these products along with coupons.  I have discussed strategies for improving constipation and encouraged the patient to discuss medication needs with physician.  I have encouraged the patient to increase water and decrease sodas.  I have provided fact sheets for the patient to take with her today.  I have answered her  questions.  MONITORING/EVALUATION (GOALS):  The patient will tolerate a healthier diet with adequate calories and protein to preserve lean body mass.  NEXT VISIT:  Monday, July 1st during  chemotherapy.    ______________________________ Lauren [name], Lauren [name], Lauren [name] Clinical Nutrition Specialist BN/MEDQ  D:  11/02/2011  T:  11/02/2011  Job:  [id number]

## 2011-11-02 NOTE — Telephone Encounter (Signed)
Talked to pt and gave her appt for chemo on 11/16/11, she will get a new calendar on 6/25 after chemo class

## 2011-11-02 NOTE — Progress Notes (Signed)
[facility]  [phone] [phone]   OFFICE PROGRESS NOTE   Cc:  CHAMBERLAIN,RACHEL, MD   PAST DIAGNOSIS AND PAST THERAPY:  1. 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 Her post resection surveillance colonoscopy onOctober 14, 2009, by Dr. Christella [name] was negative. 2. pT1a, pN0 well differentiated adenocarcinoma of lung; s/p left upper lobectomy on 06/16/2010.  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   CURRENT THERAPY: due to start concurrent chemoradiation on 11/16/2011 with daily XRT and weekly Carboplatin/Taxol.   INTERVAL HISTORY: Lauren [name] 61 y.o. female returns for regular follow up with a daughter and a sister.  She reports feeling relatively well.  When she drives her car, the seat belt rubs against her mediastinal biopsy wound.  However, the wound itself is well-healed. She still smokes cigarette however much improved compared to last week her report. She is aware that she eats to completely stop smoking by that time when she starts radiation therapy next month. She still has low appetite; however, she has not lost any further weight.  Patient denies fever, fatigue, headache, visual changes, confusion, drenching night sweats, palpable lymph node swelling, mucositis, odynophagia, dysphagia, nausea vomiting, jaundice, chest pain, palpitation, shortness of breath, dyspnea on exertion, productive cough, gum bleeding, epistaxis, hematemesis, hemoptysis, abdominal pain, abdominal swelling, early satiety, melena, hematochezia, hematuria, skin rash, spontaneous bleeding, joint swelling, joint pain, heat or cold intolerance, bowel bladder incontinence, back pain, focal motor weakness, paresthesia, depression, suicidal or homocidal ideation, feeling  hopelessness.   Past Medical History  Diagnosis Date  . Depression   . Hyperlipidemia   . Hypertension   . Colon cancer   . Lung cancer     PET scan 04/28/2010; primary: increase in size 02/2010  . Lung nodule     FNA ordered for 04/02/10 by Lauren [name]>pos Ca  . Chronic folliculitis     of groin  . Fibrocystic breast changes   . Family history of trichomonal vaginitis 05/2005  . Postmenopausal   . Depressive disorder   . Hypercholesterolemia   . GERD (gastroesophageal reflux disease)   . Cerebral aneurysm   . Back pain   . Shortness of breath   . COPD (chronic obstructive pulmonary disease)   . Coronary artery disease   . Arthritis   . Weight loss 10/22/2011    Past Surgical History  Procedure Date  . Colon surgery   . Tubal ligation   . Lung lobectomy 2012  . Hernia repair   . Breast surgery     Bil lumpectomy  . Back surgery   . Mediastinoscopy 10/19/2011    Procedure: MEDIASTINOSCOPY;  Surgeon: Alleen Borne, MD;  Location: [facility] OR;  Service: Thoracic;  Laterality: N/A;  . Cardiac cath x3     Current Outpatient Prescriptions  Medication Sig Dispense Refill  . albuterol (PROVENTIL HFA;VENTOLIN HFA) 108 (90 BASE) MCG/ACT inhaler Inhale 2 puffs into the lungs every 6 (six) hours as needed.      Marland Kitchen aspirin 81 MG EC tablet Take 81 mg by mouth daily.      . cholecalciferol (VITAMIN D) 1000 UNITS tablet Take 1,000 Units by mouth daily.      . cloNIDine (CATAPRES) 0.1 MG tablet Take 0.1 mg by mouth at bedtime.      . diclofenac sodium (VOLTAREN) 1 % GEL  Apply 1 application topically 4 (four) times daily. As needed for pain      . fish oil-omega-3 fatty acids 1000 MG capsule Take 2,400 g by mouth daily. Also contains FLAX BORAGE      . hydrochlorothiazide (HYDRODIURIL) 25 MG tablet Take 25 mg by mouth daily.      . nebivolol (BYSTOLIC) 10 MG tablet Take 10 mg by mouth daily.      . nitroGLYCERIN (NITROSTAT) 0.4 MG SL tablet Place 0.4 mg under the tongue every 5 (five) minutes as  needed.      Marland Kitchen omeprazole (PRILOSEC) 20 MG capsule Take 20 mg by mouth daily.        . pravastatin (PRAVACHOL) 40 MG tablet Take 80 mg by mouth daily.      Marland Kitchen Spirulina POWD by Does not apply route daily.      . traMADol (ULTRAM) 50 MG tablet Take 50 mg by mouth every 6 (six) hours as needed. As needed for pain.      Marland Kitchen zolpidem (AMBIEN) 5 MG tablet Take 5 mg by mouth at bedtime as needed.      . ondansetron (ZOFRAN) 8 MG tablet Take 1 tab two times a day starting the day after chemo for 3 days. Then take 1 tab two times a day as needed for nausea or vomiting.  30 tablet  1  . prochlorperazine (COMPAZINE) 10 MG tablet Take 1 tablet (10 mg total) by mouth every 6 (six) hours as needed (Nausea or vomiting).  30 tablet  1  . DISCONTD: buPROPion (WELLBUTRIN XL) 150 MG 24 hr tablet Take 1 tablet (150 mg total) by mouth 2 (two) times daily.  60 tablet  5    ALLERGIES:  is allergic to lisinopril.  REVIEW OF SYSTEMS:  The rest of the 14-point review of system was negative.   Filed Vitals:   11/02/11 1018  BP: 129/76  Pulse: 73  Temp: 98.1 F (36.7 C)   Wt Readings from Last 3 Encounters:  11/02/11 162 lb 14.4 oz (73.891 kg)  10/28/11 165 lb (74.844 kg)  10/22/11 169 lb (76.658 kg)   ECOG Performance status: 1  PHYSICAL EXAMINATION:  General: thin-appearing woman, in no acute distress. Eyes: no scleral icterus. ENT: There were no oropharyngeal lesions. Neck was without thyromegaly. Mediastinal biopsy site has healed up completely without erythema, pain, purulent discharge. Lymphatics: Negative cervical, supraclavicular or axillary adenopathy. Respiratory: lungs were clear bilaterally without wheezing or crackles. Cardiovascular: Regular rate and rhythm, S1/S2, without murmur, rub or gallop. There was no pedal edema. GI: abdomen was soft, flat, nontender, nondistended, without organomegaly. Muscoloskeletal: no spinal tenderness of palpation of vertebral spine. Skin exam was without echymosis,  petichae. Neuro exam was nonfocal. CN II-XII were intact. There was no dysmetria. Patient was able to get on and off exam table without assistance. Gait was normal. Patient was alerted and oriented. Attention was good. Language was appropriate. Mood was normal without depression. Speech was not pressured. Thought content was not tangential.    LABORATORY/RADIOLOGY DATA:  Lab Results  Component Value Date   WBC 8.6 10/19/2011   HGB 13.7 10/19/2011   HCT 42.6 10/19/2011   PLT 224 10/19/2011   GLUCOSE 109* 10/19/2011   CHOL 190 12/08/2010   TRIG 104 12/08/2010   HDL 39* 12/08/2010   LDLDIRECT 111* 02/27/2010   LDLCALC 130* 12/08/2010   ALKPHOS 104 10/19/2011   ALT 5 10/19/2011   AST 9 10/19/2011   NA 141 10/19/2011  K 3.0* 10/19/2011   CL 101 10/19/2011   CREATININE 0.72 10/19/2011   BUN 11 10/19/2011   CO2 28 10/19/2011   INR 1.10 10/19/2011    ASSESSMENT AND PLAN:   1. History of stage I non-small cell lung cancer, adenocarcinoma in 2012: s/p left lobectomy. She now has new nodules in right upper lobe. She was evaluated by CVTS with mediastinoscopy this week. Pathology was again adenocarcinoma.   - Radiation oncology Dr. Michell Heinrich evaluated patient and thought that she would benefit from concurrent chemotherapy radiation therapy as if she has stage III; nonresectable non-small cell lung cancer. Chemo is weekly Carboplatin/Taxol for the duration of radiation. I discussed with the patient and her relatives potential side effects of this chemotherapy regimen which would include but not limited to mucositis, cytopenia, infection, infusion reaction, neuropathy, myalgia, arthralgia, pneumonitis. To reassess her disease response we'll plan to get a followup CT scan the chest in about 4-6 weeks after finished radiation therapy. The patient and her relatives expressed informed understanding wished to start chemoradiotherapy in 11/16/2011.    2. History of colon cancer: She has no evidence of recurrent or metastatic  disease for her colon cancer. Surveillance for her history of colon cancer entails once-a-year CT scan for 5 years' total, with the next CT abdomen in November 2013. She had a surveillance colonoscopy in October 2012 by Dr Lauren [name], which showed polyps. Per his recommendation, the next colonoscopy is due in Oct 2015 but sooner if she has symptoms.   3. Smoking: she again is trying her best to give up. She had no success with medications in the past.   4. Hypertension: She is on HCTZ and clonidine per PCP.   5. Hypercholesterolemia: She is on pravastatin per PCP.   6. Depression/anxiety: She is on clonidine, citalopram per PCP. I referred her to Dr. Noe Gens for further evaluation and therapy per patient's request .  7. Anorexia: Due to new diagnosis of recurrent lung cancer. I referred her to nutrition per her request.   8.  Calorie/protein malnutrition:  She has appointment with Vernell Leep from Nutrition consult today.   9. Follow up: chemo on 11/16/2011.  We'll see her on 11/23/2011.

## 2011-11-03 ENCOUNTER — Ambulatory Visit (INDEPENDENT_AMBULATORY_CARE_PROVIDER_SITE_OTHER): Payer: 59 | Admitting: Psychiatry

## 2011-11-03 ENCOUNTER — Encounter: Payer: Self-pay | Admitting: Psychiatry

## 2011-11-03 DIAGNOSIS — F0630 Mood disorder due to known physiological condition, unspecified: Secondary | ICD-10-CM

## 2011-11-03 DIAGNOSIS — F17200 Nicotine dependence, unspecified, uncomplicated: Secondary | ICD-10-CM

## 2011-11-03 NOTE — Progress Notes (Signed)
11-03-2011  Patient seen for individual psychotherapy.  She reports seeing the dietician and appears to be ready to change her diet for the better. Patient will be in chemo/rad tr in July and will reschedule after she has completed treatment or needs counseling during this period.

## 2011-11-04 ENCOUNTER — Ambulatory Visit: Payer: Medicare Other

## 2011-11-10 ENCOUNTER — Other Ambulatory Visit: Payer: Medicare Other

## 2011-11-10 ENCOUNTER — Encounter: Payer: Self-pay | Admitting: *Deleted

## 2011-11-11 ENCOUNTER — Telehealth: Payer: Self-pay | Admitting: Radiation Oncology

## 2011-11-11 ENCOUNTER — Ambulatory Visit: Payer: Medicare Other

## 2011-11-11 ENCOUNTER — Encounter: Payer: Self-pay | Admitting: Radiation Oncology

## 2011-11-11 NOTE — Telephone Encounter (Signed)
Pt brought back [employer] application and also advised that [facility] referred her to [employer] for nutrition care. Will contact [employer] for addl assistance.    11/16/2011: Pt brought back proof income on bank statement and [employer] is assisting with ensure nutrition needs at this time.  11/20/2011: [employer]'s Application for Medical Nutrition Products was faxed with pt's income and insurance updates, today.  Pt advised she wants ensure plus and flavor chocolate. [employer] did assist with this application process.

## 2011-11-11 NOTE — Progress Notes (Signed)
I approved an IMRT plan today for Ms. Lauren [name].  We attempted 3D planning but her V20 was too high at 41%.  To lower this number, we would have to compromise PTV coverage.  I therefore requested IMRT simulation and treatment planning with [facility] of the cord, lungs and planning tumor volumes.  1 IMRT device will be used.  The resulting V5 and V20 values were 62% and 33% respectively.

## 2011-11-13 ENCOUNTER — Ambulatory Visit: Payer: Medicare Other

## 2011-11-16 ENCOUNTER — Ambulatory Visit (HOSPITAL_BASED_OUTPATIENT_CLINIC_OR_DEPARTMENT_OTHER): Payer: Medicare Other

## 2011-11-16 ENCOUNTER — Ambulatory Visit: Payer: Medicare Other | Admitting: Nutrition

## 2011-11-16 ENCOUNTER — Encounter: Payer: Self-pay | Admitting: Radiation Oncology

## 2011-11-16 ENCOUNTER — Ambulatory Visit
Admission: RE | Admit: 2011-11-16 | Discharge: 2011-11-16 | Disposition: A | Discharge status: Home / Self Care | Payer: Medicare Other | Source: Ambulatory Visit | Attending: Radiation Oncology | Admitting: Radiation Oncology

## 2011-11-16 VITALS — BP 136/80 | HR 64 | Temp 97.5°F

## 2011-11-16 DIAGNOSIS — C187 Malignant neoplasm of sigmoid colon: Secondary | ICD-10-CM

## 2011-11-16 DIAGNOSIS — C3410 Malignant neoplasm of upper lobe, unspecified bronchus or lung: Secondary | ICD-10-CM

## 2011-11-16 DIAGNOSIS — C3490 Malignant neoplasm of unspecified part of unspecified bronchus or lung: Secondary | ICD-10-CM

## 2011-11-16 DIAGNOSIS — Z5111 Encounter for antineoplastic chemotherapy: Secondary | ICD-10-CM

## 2011-11-16 MED ORDER — ONDANSETRON 16 MG/50ML IVPB (CHCC)
16.0000 mg | Freq: Once | INTRAVENOUS | Status: AC
Start: 2011-11-16 — End: 2011-11-16
  Administered 2011-11-16: 16 mg via INTRAVENOUS

## 2011-11-16 MED ORDER — DEXAMETHASONE SODIUM PHOSPHATE 4 MG/ML IJ SOLN
20.0000 mg | Freq: Once | INTRAMUSCULAR | Status: AC
  Administered 2011-11-16: 20 mg via INTRAVENOUS

## 2011-11-16 MED ORDER — SODIUM CHLORIDE 0.9 % IV SOLN
243.6000 mg | Freq: Once | INTRAVENOUS | Status: AC
  Administered 2011-11-16: 240 mg via INTRAVENOUS
  Filled 2011-11-16: qty 24

## 2011-11-16 MED ORDER — PACLITAXEL CHEMO INJECTION 300 MG/50ML
45.0000 mg/m2 | Freq: Once | INTRAVENOUS | Status: AC
  Administered 2011-11-16: 84 mg via INTRAVENOUS
  Filled 2011-11-16: qty 14

## 2011-11-16 MED ORDER — FAMOTIDINE IN NACL 20-0.9 MG/50ML-% IV SOLN
20.0000 mg | Freq: Once | INTRAVENOUS | Status: AC
  Administered 2011-11-16: 20 mg via INTRAVENOUS

## 2011-11-16 MED ORDER — SODIUM CHLORIDE 0.9 % IV SOLN
Freq: Once | INTRAVENOUS | Status: AC
  Administered 2011-11-16: 09:00:00 via INTRAVENOUS

## 2011-11-16 MED ORDER — DIPHENHYDRAMINE HCL 50 MG/ML IJ SOLN
50.0000 mg | Freq: Once | INTRAMUSCULAR | Status: AC
  Administered 2011-11-16: 50 mg via INTRAVENOUS

## 2011-11-16 NOTE — Patient Instructions (Signed)
[facility] Discharge Instructions for Patients Receiving Chemotherapy  Today you received the following chemotherapy agents taxol, carboplatin.  To help prevent nausea and vomiting after your treatment, we encourage you to take your nausea medication. Begin taking it at 7 pm and take it as often as prescribed for the next 24-96  hours.   If you develop nausea and vomiting that is not controlled by your nausea medication, call the clinic. If it is after clinic hours your family physician or the after hours number for the clinic or go to the Emergency Department.   BELOW ARE SYMPTOMS THAT SHOULD BE REPORTED IMMEDIATELY:  *FEVER GREATER THAN 100.5 F  *CHILLS WITH OR WITHOUT FEVER  NAUSEA AND VOMITING THAT IS NOT CONTROLLED WITH YOUR NAUSEA MEDICATION  *UNUSUAL SHORTNESS OF BREATH  *UNUSUAL BRUISING OR BLEEDING  TENDERNESS IN MOUTH AND THROAT WITH OR WITHOUT PRESENCE OF ULCERS  *URINARY PROBLEMS  *BOWEL PROBLEMS  UNUSUAL RASH Items with * indicate a potential emergency and should be followed up as soon as possible.  One of the nurses will contact you 24 hours after your treatment. Please let the nurse know about any problems that you may have experienced. Feel free to call the clinic you have any questions or concerns. The clinic phone number is [phone].   I have been informed and understand all the instructions given to me. I know to contact the clinic, my physician, or go to the Emergency Department if any problems should occur. I do not have any questions at this time, but understand that I may call the clinic during office hours   should I have any questions or need assistance in obtaining follow up care.    __________________________________________  _____________  __________ Signature of Patient or Authorized Representative            Date                   Time    __________________________________________   Paclitaxel injection Dorice Lamas What is  this medicine? PACLITAXEL (PAK li TAX el) is a chemotherapy drug. It targets fast dividing cells, like cancer cells, and causes these cells to die. This medicine is used to treat ovarian cancer, breast cancer, and other cancers. This medicine may be used for other purposes; ask your health care provider or pharmacist if you have questions. What should I tell my health care provider before I take this medicine? They need to know if you have any of these conditions: -blood disorders -irregular heartbeat -infection (especially a virus infection such as chickenpox, cold sores, or herpes) -liver disease -previous or ongoing radiation therapy -an unusual or allergic reaction to paclitaxel, alcohol, polyoxyethylated castor oil, other chemotherapy agents, other medicines, foods, dyes, or preservatives -pregnant or trying to get pregnant -breast-feeding How should I use this medicine? This drug is given as an infusion into a vein. It is administered in a hospital or clinic by a specially trained health care professional. Talk to your pediatrician regarding the use of this medicine in children. Special care may be needed. Overdosage: If you think you have taken too much of this medicine contact a poison control center or emergency room at once. NOTE: This medicine is only for you. Do not share this medicine with others. What if I miss a dose? It is important not to miss your dose. Call your doctor or health care professional if you are unable to keep an appointment. What may interact with this medicine?  Do not take this medicine with any of the following medications: -disulfiram -metronidazole This medicine may also interact with the following medications: -cyclosporine -dexamethasone -diazepam -ketoconazole -medicines to increase blood counts like filgrastim, pegfilgrastim, sargramostim -other chemotherapy drugs like cisplatin, doxorubicin, epirubicin, etoposide, teniposide,  vincristine -quinidine -testosterone -vaccines -verapamil Talk to your doctor or health care professional before taking any of these medicines: -acetaminophen -aspirin -ibuprofen -ketoprofen -naproxen This list may not describe all possible interactions. Give your health care provider a list of all the medicines, herbs, non-prescription drugs, or dietary supplements you use. Also tell them if you smoke, drink alcohol, or use illegal drugs. Some items may interact with your medicine. What should I watch for while using this medicine? Your condition will be monitored carefully while you are receiving this medicine. You will need important blood work done while you are taking this medicine. This drug may make you feel generally unwell. This is not uncommon, as chemotherapy can affect healthy cells as well as cancer cells. Report any side effects. Continue your course of treatment even though you feel ill unless your doctor tells you to stop. In some cases, you may be given additional medicines to help with side effects. Follow all directions for their use. Call your doctor or health care professional for advice if you get a fever, chills or sore throat, or other symptoms of a cold or flu. Do not treat yourself. This drug decreases your body's ability to fight infections. Try to avoid being around people who are sick. This medicine may increase your risk to bruise or bleed. Call your doctor or health care professional if you notice any unusual bleeding. Be careful brushing and flossing your teeth or using a toothpick because you may get an infection or bleed more easily. If you have any dental work done, tell your dentist you are receiving this medicine. Avoid taking products that contain aspirin, acetaminophen, ibuprofen, naproxen, or ketoprofen unless instructed by your doctor. These medicines may hide a fever. Do not become pregnant while taking this medicine. Women should inform their doctor if  they wish to become pregnant or think they might be pregnant. There is a potential for serious side effects to an unborn child. Talk to your health care professional or pharmacist for more information. Do not breast-feed an infant while taking this medicine. Men are advised not to father a child while receiving this medicine. What side effects may I notice from receiving this medicine? Side effects that you should report to your doctor or health care professional as soon as possible: -allergic reactions like skin rash, itching or hives, swelling of the face, lips, or tongue -low blood counts - This drug may decrease the number of white blood cells, red blood cells and platelets. You may be at increased risk for infections and bleeding. -signs of infection - fever or chills, cough, sore throat, pain or difficulty passing urine -signs of decreased platelets or bleeding - bruising, pinpoint red spots on the skin, black, tarry stools, nosebleeds -signs of decreased red blood cells - unusually weak or tired, fainting spells, lightheadedness -breathing problems -chest pain -high or low blood pressure -mouth sores -nausea and vomiting -pain, swelling, redness or irritation at the injection site -pain, tingling, numbness in the hands or feet -slow or irregular heartbeat -swelling of the ankle, feet, hands Side effects that usually do not require medical attention (report to your doctor or health care professional if they continue or are bothersome): -bone pain -complete hair loss including  hair on your head, underarms, pubic hair, eyebrows, and eyelashes -changes in the color of fingernails -diarrhea -loosening of the fingernails -loss of appetite -muscle or joint pain -red flush to skin -sweating This list may not describe all possible side effects. Call your doctor for medical advice about side effects. You may report side effects to FDA at 1-800-FDA-1088. Where should I keep my  medicine? This drug is given in a hospital or clinic and will not be stored at home. NOTE: This sheet is a summary. It may not cover all possible information. If you have questions about this medicine, talk to your doctor, pharmacist, or health care provider.  2012, [employer] Standard. (04/16/2008 11:54:26 AM) Nurse's Signature   Carboplatin injection What is this medicine? CARBOPLATIN (KAR boe pla tin) is a chemotherapy drug. It targets fast dividing cells, like cancer cells, and causes these cells to die. This medicine is used to treat ovarian cancer and many other cancers. This medicine may be used for other purposes; ask your health care provider or pharmacist if you have questions. What should I tell my health care provider before I take this medicine? They need to know if you have any of these conditions: -blood disorders -hearing problems -kidney disease -recent or ongoing radiation therapy -an unusual or allergic reaction to carboplatin, cisplatin, other chemotherapy, other medicines, foods, dyes, or preservatives -pregnant or trying to get pregnant -breast-feeding How should I use this medicine? This drug is usually given as an infusion into a vein. It is administered in a hospital or clinic by a specially trained health care professional. Talk to your pediatrician regarding the use of this medicine in children. Special care may be needed. Overdosage: If you think you have taken too much of this medicine contact a poison control center or emergency room at once. NOTE: This medicine is only for you. Do not share this medicine with others. What if I miss a dose? It is important not to miss a dose. Call your doctor or health care professional if you are unable to keep an appointment. What may interact with this medicine? -medicines for seizures -medicines to increase blood counts like filgrastim, pegfilgrastim, sargramostim -some antibiotics like amikacin, gentamicin,  neomycin, streptomycin, tobramycin -vaccines Talk to your doctor or health care professional before taking any of these medicines: -acetaminophen -aspirin -ibuprofen -ketoprofen -naproxen This list may not describe all possible interactions. Give your health care provider a list of all the medicines, herbs, non-prescription drugs, or dietary supplements you use. Also tell them if you smoke, drink alcohol, or use illegal drugs. Some items may interact with your medicine. What should I watch for while using this medicine? Your condition will be monitored carefully while you are receiving this medicine. You will need important blood work done while you are taking this medicine. This drug may make you feel generally unwell. This is not uncommon, as chemotherapy can affect healthy cells as well as cancer cells. Report any side effects. Continue your course of treatment even though you feel ill unless your doctor tells you to stop. In some cases, you may be given additional medicines to help with side effects. Follow all directions for their use. Call your doctor or health care professional for advice if you get a fever, chills or sore throat, or other symptoms of a cold or flu. Do not treat yourself. This drug decreases your body's ability to fight infections. Try to avoid being around people who are sick. This medicine may increase your risk  to bruise or bleed. Call your doctor or health care professional if you notice any unusual bleeding. Be careful brushing and flossing your teeth or using a toothpick because you may get an infection or bleed more easily. If you have any dental work done, tell your dentist you are receiving this medicine. Avoid taking products that contain aspirin, acetaminophen, ibuprofen, naproxen, or ketoprofen unless instructed by your doctor. These medicines may hide a fever. Do not become pregnant while taking this medicine. Women should inform their doctor if they wish to  become pregnant or think they might be pregnant. There is a potential for serious side effects to an unborn child. Talk to your health care professional or pharmacist for more information. Do not breast-feed an infant while taking this medicine. What side effects may I notice from receiving this medicine? Side effects that you should report to your doctor or health care professional as soon as possible: -allergic reactions like skin rash, itching or hives, swelling of the face, lips, or tongue -signs of infection - fever or chills, cough, sore throat, pain or difficulty passing urine -signs of decreased platelets or bleeding - bruising, pinpoint red spots on the skin, black, tarry stools, nosebleeds -signs of decreased red blood cells - unusually weak or tired, fainting spells, lightheadedness -breathing problems -changes in hearing -changes in vision -chest pain -high blood pressure -low blood counts - This drug may decrease the number of white blood cells, red blood cells and platelets. You may be at increased risk for infections and bleeding. -nausea and vomiting -pain, swelling, redness or irritation at the injection site -pain, tingling, numbness in the hands or feet -problems with balance, talking, walking -trouble passing urine or change in the amount of urine Side effects that usually do not require medical attention (report to your doctor or health care professional if they continue or are bothersome): -hair loss -loss of appetite -metallic taste in the mouth or changes in taste This list may not describe all possible side effects. Call your doctor for medical advice about side effects. You may report side effects to FDA at 1-800-FDA-1088. Where should I keep my medicine? This drug is given in a hospital or clinic and will not be stored at home. NOTE: This sheet is a summary. It may not cover all possible information. If you have questions about this medicine, talk to your doctor,  pharmacist, or health care provider.  2012, [employer]vier/Gold Standard. (08/09/2007 2:38:05 PM)

## 2011-11-16 NOTE — Progress Notes (Signed)
Ms. Amoroso is receiving chemotherapy today.  She is very tired from the medication they have already provided to her.  She was able to speak briefly with me and tells me she is trying to snack more often, but still is only eating 1 meal a day.  She has been eating more trail mix with nuts and seeds and dried plums to help with her constipation.  Her weight has not been documented since our last visit.  She has no specific nutrition concerns today.  NUTRITION DIAGNOSIS:  Food and nutrition-related knowledge deficit has improved.  INTERVENTION:  I have encouraged Ms. Arif and her family to continue small frequent meals and snacks throughout the day utilizing higher calorie, higher protein foods to promote weight maintenance.  I have encouraged her to continue increased fluids to help with constipation.  I have stressed the importance of weight maintenance.  MONITORING/EVALUATION/GOALS:  The patient will continue to tolerate oral diet with adequate calories and protein to preserve lean muscle mass.  NEXT VISIT:  Monday, July 8th during chemotherapy.   ______________________________ Zenovia Jarred, RD, LDN Clinical Nutrition Specialist BN/MEDQ  D:  11/16/2011  T:  11/16/2011  Job:  [id number]

## 2011-11-17 ENCOUNTER — Ambulatory Visit
Admission: RE | Admit: 2011-11-17 | Discharge: 2011-11-17 | Disposition: A | Discharge status: Home / Self Care | Payer: Medicare Other | Source: Ambulatory Visit | Attending: Radiation Oncology | Admitting: Radiation Oncology

## 2011-11-17 ENCOUNTER — Telehealth: Payer: Self-pay | Admitting: *Deleted

## 2011-11-17 ENCOUNTER — Encounter: Payer: Self-pay | Admitting: Radiation Oncology

## 2011-11-17 VITALS — BP 128/76 | HR 65 | Resp 18 | Wt 166.7 lb

## 2011-11-17 DIAGNOSIS — C3490 Malignant neoplasm of unspecified part of unspecified bronchus or lung: Secondary | ICD-10-CM

## 2011-11-17 NOTE — Telephone Encounter (Signed)
Message copied by Augusto Garbe on Tue Nov 17, 2011 10:38 AM ------      Message from: Adella Hare K      Created: Mon Nov 16, 2011 10:57 AM      Regarding: chemo follow up       1st time taxol/carbo.  Dr. Gaylyn Rong

## 2011-11-17 NOTE — Progress Notes (Signed)
Patient presents to the clinic today accompanied by her sister, Lauren [name], for a put with Dr. Michell Heinrich. Patient is alert and oriented to person, place, and time. No distress noted. Steady gait noted. Pleasant affect noted. Patient denies pain at this time. Patient reports an occasional productive cough with thick white sputum. Patient reports shortness of breath with exertion. Patient denies sore throat or difficulty swallowing. Patient excited that she has gained four pounds since 11/02/2011. Reported all finding to Dr. Michell Heinrich.

## 2011-11-17 NOTE — Progress Notes (Signed)
Weekly Management Note Current Dose: 4 Gy  Projected Dose:60 Gy   Narrative:  The patient presents for routine under treatment assessment.  CBCT/MVCT images/Port film x-rays were reviewed.  The chart was checked. Doing well. No nausea from chemo.  Slept well. Really no new complaints.   Physical Findings: Weight: 166 lb 11.2 oz (75.615 kg). Unchanged  Impression:  The patient is tolerating radiation.  Plan:  Continue treatment as planned. RN education tomorrow. Add biafene to back for skin protection. Updated pharmacy to [facility] outpatient pharmacy.  Will call in scripts for that next week.

## 2011-11-17 NOTE — Telephone Encounter (Signed)
Lauren [name] is doing well.  Asked for clarification how to take the anti-emetics after the first three days.   Also asked about appointments.  Radiation asked that she contact us because the last chemo is scheduled for 12-14-2011.  Informed her the provider asked the scheduler to schedule x five weeks which has been scheduled correctly.  Suggested she discuss treatment schedule with provider at f/u for clarification.  Needs to know asap to arrange transportation.  Lauren [name] is very positive and thankful and complimentary to all staff.

## 2011-11-18 ENCOUNTER — Ambulatory Visit
Admission: RE | Admit: 2011-11-18 | Discharge: 2011-11-18 | Disposition: A | Discharge status: Home / Self Care | Payer: Medicare Other | Source: Ambulatory Visit | Attending: Radiation Oncology | Admitting: Radiation Oncology

## 2011-11-18 ENCOUNTER — Ambulatory Visit: Payer: Medicare Other

## 2011-11-18 DIAGNOSIS — C3490 Malignant neoplasm of unspecified part of unspecified bronchus or lung: Secondary | ICD-10-CM

## 2011-11-18 MED ORDER — BIAFINE EX EMUL
Freq: Every day | CUTANEOUS | Status: DC
  Administered 2011-11-18: 16:00:00 via TOPICAL

## 2011-11-18 NOTE — Progress Notes (Signed)
1400 Received patient in the clinic today accompanied by her sister for post sim education with Sam,RN. Patient is alert and oriented to person, place, and time. No distress noted. Steady gait noted. Pleasant affect noted. Patient denies pain at this time. Oriented patient to staff and routine of the clinic. Provided patient with RADIATION THERAPY AND YOU handbook then, reviewed pertinent information. Provided patient with Biafine and reviewed use. Educated patient on potential side effects and management. Provided patient with business card and encouraged to call with needs. Patient verbalized understanding of all things reviewed.

## 2011-11-20 ENCOUNTER — Ambulatory Visit
Admission: RE | Admit: 2011-11-20 | Discharge: 2011-11-20 | Disposition: A | Discharge status: Home / Self Care | Payer: Medicare Other | Source: Ambulatory Visit | Attending: Radiation Oncology | Admitting: Radiation Oncology

## 2011-11-23 ENCOUNTER — Other Ambulatory Visit (HOSPITAL_BASED_OUTPATIENT_CLINIC_OR_DEPARTMENT_OTHER): Payer: Medicare Other | Admitting: Lab

## 2011-11-23 ENCOUNTER — Encounter: Payer: Self-pay | Admitting: Oncology

## 2011-11-23 ENCOUNTER — Ambulatory Visit
Admission: RE | Admit: 2011-11-23 | Discharge: 2011-11-23 | Disposition: A | Discharge status: Home / Self Care | Payer: Medicare Other | Source: Ambulatory Visit | Attending: Radiation Oncology | Admitting: Radiation Oncology

## 2011-11-23 ENCOUNTER — Ambulatory Visit (HOSPITAL_BASED_OUTPATIENT_CLINIC_OR_DEPARTMENT_OTHER): Payer: Medicare Other

## 2011-11-23 ENCOUNTER — Ambulatory Visit: Payer: Medicare Other | Admitting: Oncology

## 2011-11-23 ENCOUNTER — Ambulatory Visit (HOSPITAL_BASED_OUTPATIENT_CLINIC_OR_DEPARTMENT_OTHER): Payer: Medicare Other | Admitting: Oncology

## 2011-11-23 ENCOUNTER — Ambulatory Visit: Payer: Medicare Other | Admitting: Nutrition

## 2011-11-23 VITALS — BP 111/66 | HR 75 | Temp 96.8°F | Ht 64.0 in | Wt 161.8 lb

## 2011-11-23 DIAGNOSIS — C3410 Malignant neoplasm of upper lobe, unspecified bronchus or lung: Secondary | ICD-10-CM

## 2011-11-23 DIAGNOSIS — C3490 Malignant neoplasm of unspecified part of unspecified bronchus or lung: Secondary | ICD-10-CM

## 2011-11-23 DIAGNOSIS — Z5111 Encounter for antineoplastic chemotherapy: Secondary | ICD-10-CM

## 2011-11-23 DIAGNOSIS — C187 Malignant neoplasm of sigmoid colon: Secondary | ICD-10-CM

## 2011-11-23 DIAGNOSIS — R630 Anorexia: Secondary | ICD-10-CM

## 2011-11-23 LAB — CBC WITH DIFFERENTIAL/PLATELET
BASO%: 0.5 % (ref 0.0–2.0)
EOS%: 2.3 % (ref 0.0–7.0)
HGB: 13.3 g/dL (ref 11.6–15.9)
MCH: 24.4 pg — ABNORMAL LOW (ref 25.1–34.0)
MCHC: 33.2 g/dL (ref 31.5–36.0)
RBC: 5.44 10*6/uL (ref 3.70–5.45)
RDW: 15.9 % — ABNORMAL HIGH (ref 11.2–14.5)
lymph#: 0.9 10*3/uL (ref 0.9–3.3)
nRBC: 0 % (ref 0–0)

## 2011-11-23 LAB — BASIC METABOLIC PANEL
Calcium: 9.2 mg/dL (ref 8.4–10.5)
Potassium: 4 mEq/L (ref 3.5–5.3)
Sodium: 138 mEq/L (ref 135–145)

## 2011-11-23 MED ORDER — FAMOTIDINE IN NACL 20-0.9 MG/50ML-% IV SOLN
20.0000 mg | Freq: Once | INTRAVENOUS | Status: AC
  Administered 2011-11-23: 20 mg via INTRAVENOUS

## 2011-11-23 MED ORDER — SODIUM CHLORIDE 0.9 % IV SOLN
243.6000 mg | Freq: Once | INTRAVENOUS | Status: AC
  Administered 2011-11-23: 240 mg via INTRAVENOUS
  Filled 2011-11-23: qty 24

## 2011-11-23 MED ORDER — DEXAMETHASONE SODIUM PHOSPHATE 4 MG/ML IJ SOLN
20.0000 mg | Freq: Once | INTRAMUSCULAR | Status: AC
  Administered 2011-11-23: 20 mg via INTRAVENOUS

## 2011-11-23 MED ORDER — SODIUM CHLORIDE 0.9 % IV SOLN
Freq: Once | INTRAVENOUS | Status: AC
  Administered 2011-11-23: 12:00:00 via INTRAVENOUS

## 2011-11-23 MED ORDER — ONDANSETRON 16 MG/50ML IVPB (CHCC)
16.0000 mg | Freq: Once | INTRAVENOUS | Status: AC
Start: 2011-11-23 — End: 2011-11-23
  Administered 2011-11-23: 16 mg via INTRAVENOUS

## 2011-11-23 MED ORDER — PACLITAXEL CHEMO INJECTION 300 MG/50ML
45.0000 mg/m2 | Freq: Once | INTRAVENOUS | Status: AC
  Administered 2011-11-23: 84 mg via INTRAVENOUS
  Filled 2011-11-23: qty 14

## 2011-11-23 MED ORDER — DIPHENHYDRAMINE HCL 50 MG/ML IJ SOLN
50.0000 mg | Freq: Once | INTRAMUSCULAR | Status: AC
  Administered 2011-11-23: 50 mg via INTRAVENOUS

## 2011-11-23 NOTE — Patient Instructions (Signed)
Patient aware of next appointment; discharged with daughter to radiology, and no complaints

## 2011-11-23 NOTE — Progress Notes (Signed)
[facility]  [phone] [phone]   OFFICE PROGRESS NOTE   Cc:  CHAMBERLAIN,RACHEL, MD   PAST DIAGNOSIS AND PAST THERAPY:  1. 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 Her post resection surveillance colonoscopy onOctober 14, 2009, by Dr. Christella Hartigan was negative. 2. pT1a, pN0 well differentiated adenocarcinoma of lung; s/p left upper lobectomy on 06/16/2010.  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   CURRENT THERAPY: Receiving concurrent chemoradiation with weekly Carboplatin/Taxol started on 11/16/11.   INTERVAL HISTORY: Lauren [name] 61 y.o. female returns for regular follow up with her daughter.  She reports feeling relatively well.  When she drives her car, the seat belt rubs against her mediastinal biopsy wound.  However, the wound itself is well-healed. She is using a Band-Aid over her biopsy site when in the care only. She still smokes cigarettes, about 2-3 cigarettes per day. She has been referred to a smoking cessation class, but does not plan to attend until she completes her XRT and chemotherapy. She is aware that she needs to completely stop smoking. She still has a decreased appetite; however, she has not lost any further weight. Tolerated her first cycle of chemotherapy well. No worsening of fatigue. No chest pain, shortness of breath, nausea, or vomiting. No neuropathy.   Patient denies fever, fatigue, headache, visual changes, confusion, drenching night sweats, palpable lymph node swelling, mucositis, odynophagia, dysphagia, nausea vomiting, jaundice, chest pain, palpitation, shortness of breath, dyspnea on exertion, productive cough, gum bleeding, epistaxis, hematemesis, hemoptysis, abdominal pain, abdominal swelling, early satiety, melena,  hematochezia, hematuria, skin rash, spontaneous bleeding, joint swelling, joint pain, heat or cold intolerance, bowel bladder incontinence, back pain, focal motor weakness, paresthesia, depression, suicidal or homocidal ideation, feeling hopelessness.   Past Medical History  Diagnosis Date  . Depression   . Hyperlipidemia   . Hypertension   . Colon cancer   . Lung cancer     PET scan 04/28/2010; primary: increase in size 02/2010  . Lung nodule     FNA ordered for 04/02/10 by HA>pos Ca  . Chronic folliculitis     of groin  . Fibrocystic breast changes   . Family history of trichomonal vaginitis 05/2005  . Postmenopausal   . Depressive disorder   . Hypercholesterolemia   . GERD (gastroesophageal reflux disease)   . Cerebral aneurysm   . Back pain   . Shortness of breath   . COPD (chronic obstructive pulmonary disease)   . Coronary artery disease   . Arthritis   . Weight loss 10/22/2011    Past Surgical History  Procedure Date  . Colon surgery   . Tubal ligation   . Lung lobectomy 2012  . Hernia repair   . Breast surgery     Bil lumpectomy  . Back surgery   . Mediastinoscopy 10/19/2011    Procedure: MEDIASTINOSCOPY;  Surgeon: Alleen Borne, MD;  Location: [facility] OR;  Service: Thoracic;  Laterality: N/A;  . Cardiac cath x3     Current Outpatient Prescriptions  Medication Sig Dispense Refill  . albuterol (PROVENTIL HFA;VENTOLIN HFA) 108 (90 BASE) MCG/ACT inhaler Inhale 2 puffs into the lungs every 6 (six) hours as needed.      Marland Kitchen aspirin 81 MG EC tablet Take 81 mg by mouth daily.      . cholecalciferol (VITAMIN D)  1000 UNITS tablet Take 1,000 Units by mouth daily.      . cloNIDine (CATAPRES) 0.1 MG tablet Take 0.1 mg by mouth at bedtime.      . diclofenac sodium (VOLTAREN) 1 % GEL Apply 1 application topically 4 (four) times daily. As needed for pain      . emollient (BIAFINE) cream Apply topically as needed.      . fish oil-omega-3 fatty acids 1000 MG capsule Take 2,400 g by  mouth daily. Also contains FLAX BORAGE      . hydrochlorothiazide (HYDRODIURIL) 25 MG tablet Take 25 mg by mouth daily.      . nebivolol (BYSTOLIC) 10 MG tablet Take 10 mg by mouth daily.      . nitroGLYCERIN (NITROSTAT) 0.4 MG SL tablet Place 0.4 mg under the tongue every 5 (five) minutes as needed.      Marland Kitchen omeprazole (PRILOSEC) 20 MG capsule Take 20 mg by mouth daily.        . ondansetron (ZOFRAN) 8 MG tablet Take 1 tab two times a day starting the day after chemo for 3 days. Then take 1 tab two times a day as needed for nausea or vomiting.  30 tablet  1  . pravastatin (PRAVACHOL) 40 MG tablet Take 80 mg by mouth daily.      . prochlorperazine (COMPAZINE) 10 MG tablet Take 1 tablet (10 mg total) by mouth every 6 (six) hours as needed (Nausea or vomiting).  30 tablet  1  . Spirulina POWD by Does not apply route daily.      . traMADol (ULTRAM) 50 MG tablet Take 50 mg by mouth every 6 (six) hours as needed. As needed for pain.      Marland Kitchen zolpidem (AMBIEN) 5 MG tablet Take 5 mg by mouth at bedtime as needed.      Marland Kitchen DISCONTD: buPROPion (WELLBUTRIN XL) 150 MG 24 hr tablet Take 1 tablet (150 mg total) by mouth 2 (two) times daily.  60 tablet  5    ALLERGIES:  is allergic to lisinopril.  REVIEW OF SYSTEMS:  The rest of the 14-point review of system was negative.   There were no vitals filed for this visit. Wt Readings from Last 3 Encounters:  11/17/11 166 lb 11.2 oz (75.615 kg)  11/02/11 162 lb 14.4 oz (73.891 kg)  10/28/11 165 lb (74.844 kg)   ECOG Performance status: 1  PHYSICAL EXAMINATION:  General: thin-appearing woman, in no acute distress. Eyes: no scleral icterus. ENT: There were no oropharyngeal lesions. Neck was without thyromegaly. Mediastinal biopsy site has healed up completely without erythema, pain, purulent discharge. Lymphatics: Negative cervical, supraclavicular or axillary adenopathy. Respiratory: lungs were clear bilaterally without wheezing or crackles. Cardiovascular: Regular  rate and rhythm, S1/S2, without murmur, rub or gallop. There was no pedal edema. GI: abdomen was soft, flat, nontender, nondistended, without organomegaly. Muscoloskeletal: no spinal tenderness of palpation of vertebral spine. Skin exam was without echymosis, petichae. Neuro exam was nonfocal. CN II-XII were intact. There was no dysmetria. Patient was able to get on and off exam table without assistance. Gait was normal. Patient was alerted and oriented. Attention was good. Language was appropriate. Mood was normal without depression. Speech was not pressured. Thought content was not tangential.    LABORATORY/RADIOLOGY DATA:  Lab Results  Component Value Date   WBC 8.6 10/19/2011   HGB 13.7 10/19/2011   HCT 42.6 10/19/2011   PLT 224 10/19/2011   GLUCOSE 109* 10/19/2011   CHOL 190 12/08/2010  TRIG 104 12/08/2010   HDL 39* 12/08/2010   LDLDIRECT 111* 02/27/2010   LDLCALC 130* 12/08/2010   ALKPHOS 104 10/19/2011   ALT 5 10/19/2011   AST 9 10/19/2011   NA 141 10/19/2011   K 3.0* 10/19/2011   CL 101 10/19/2011   CREATININE 0.72 10/19/2011   BUN 11 10/19/2011   CO2 28 10/19/2011   INR 1.10 10/19/2011    ASSESSMENT AND PLAN:   1. History of stage I non-small cell lung cancer, adenocarcinoma in 2012: s/p left lobectomy. She now has new nodules in right upper lobe. She was evaluated by CVTS with mediastinoscopy this week. Pathology was again adenocarcinoma. Started concurrent XRT with weekly Carboplatin/Taxol. Tolerating treatment well. I have recommended that she proceed with week 2 of her chemotherapy today without dose modification. To reassess her disease response we'll plan to get a followup CT scan the chest in about 4-6 weeks after finished radiation therapy.   2. History of colon cancer: She has no evidence of recurrent or metastatic disease for her colon cancer. Surveillance for her history of colon cancer entails once-a-year CT scan for 5 years' total, with the next CT abdomen in November 2013. She had a  surveillance colonoscopy in October 2012 by Dr Christella Hartigan, which showed polyps. Per his recommendation, the next colonoscopy is due in Oct 2015 but sooner if she has symptoms.   3. Smoking: she again is trying her best to give up. She had no success with medications in the past. She has already been referred for a smoking cessation class.   4. Hypertension: She is on HCTZ and clonidine per PCP.   5. Hypercholesterolemia: She is on pravastatin per PCP.   6. Depression/anxiety: She is on clonidine, citalopram per PCP. She has been referred to Dr. Noe Gens for further evaluation and therapy per patient's request .  7. Anorexia: Due to new diagnosis of recurrent lung cancer. She is followed by Vernell Leep.  8. Follow up: 1 week.

## 2011-11-23 NOTE — Progress Notes (Signed)
The patient reports she continues to eat frequently throughout the day.  She is snacking on foods she enjoys.  She drinks an Ensure shake almost every day.  Her weight is fluctuating and was documented as 161.8 pounds July 8, decreased from 166.7 pounds July 2 but basically stable from 162.9 pounds June 17.  The patient denies issues with constipation at this time.  She has no specific nutrition concerns.  NUTRITION DIAGNOSIS:  Food and nutrition related knowledge deficit continues to improve.  INTERVENTION:  Ms. Glendinning was encouraged to continue to focus on higher calorie, higher protein foods.  I have encouraged her to consume fluids that have calories and hopefully protein in them such as [employer] or Ensure shakes in addition to water.  I have enforced the importance of weight maintenance.  MONITORING/EVALUATION/GOALS:  The patient has continued to tolerate her oral diet and has basically had weight stabilization.  NEXT VISIT:  Monday July 15 during chemotherapy.    ______________________________ Zenovia Jarred, RD, LDN Clinical Nutrition Specialist BN/MEDQ  D:  11/23/2011  T:  11/23/2011  Job:  [id number]

## 2011-11-24 ENCOUNTER — Encounter: Payer: Self-pay | Admitting: Radiation Oncology

## 2011-11-24 ENCOUNTER — Ambulatory Visit
Admission: RE | Admit: 2011-11-24 | Discharge: 2011-11-24 | Disposition: A | Discharge status: Home / Self Care | Payer: Medicare Other | Source: Ambulatory Visit | Attending: Radiation Oncology | Admitting: Radiation Oncology

## 2011-11-24 VITALS — BP 128/84 | HR 81 | Temp 97.9°F | Resp 20

## 2011-11-24 DIAGNOSIS — C3490 Malignant neoplasm of unspecified part of unspecified bronchus or lung: Secondary | ICD-10-CM

## 2011-11-24 MED ORDER — MAGIC MOUTHWASH W/LIDOCAINE: 5.0000 mL | Freq: Three times a day (TID) | ORAL | Status: DC | PRN

## 2011-11-24 MED ORDER — SUCRALFATE 1 G PO TABS: 1.0000 g | ORAL_TABLET | Freq: Four times a day (QID) | ORAL | Status: DC

## 2011-11-24 MED ORDER — HYDROCODONE-ACETAMINOPHEN 7.5-500 MG/15ML PO SOLN: 15.0000 mL | Freq: Four times a day (QID) | ORAL | Status: AC | PRN

## 2011-11-24 NOTE — Progress Notes (Signed)
Patient alert,oriented x3, steady gait, no c/o pain, nausea, no mouth sores, vitals wnl, 99% room air sats, "only a lttle tired at tiomes stated", next chemotherapy this coming MOnday, Taxol /Carboplatin , eating and drinking well, concerned after treatments will her cancer come back, be gone, or what",will defer to Md, using biafine cream after radiation and bedtime, throat has slight redness where incision is 2:38 PM

## 2011-11-24 NOTE — Progress Notes (Signed)
Addendum, loss 4 lbs since 11/17/11, took ortho vitals, drinks plenty fluids,eats more vegetables, , drinks 1-2 ensure daily, standing b/p=113/69,p=76 Sitting b/p=128/84,p=81,rr=20,

## 2011-11-24 NOTE — Progress Notes (Signed)
Weekly Management Note Current Dose: 12 Gy  Projected Dose:60 Gy   Narrative:  The patient presents for routine under treatment assessment.  CBCT/MVCT images/Port film x-rays were reviewed.  The chart was checked. Doing well. Some fatigue.  Drinking 2 boost per day. Met with dietician. No dysphagia.   Physical Findings:  Unchanged. Alert and oriented.   Vitals:  Filed Vitals:   11/24/11 1440  BP: 128/84  Pulse: 81  Temp: 97.9 F (36.6 C)  Resp: 20   Weight:  Wt Readings from Last 3 Encounters:  11/23/11 161 lb 12.8 oz (73.392 kg)  11/17/11 166 lb 11.2 oz (75.615 kg)  11/02/11 162 lb 14.4 oz (73.891 kg)   Lab Results  Component Value Date   WBC 4.3 11/23/2011   HGB 13.3 11/23/2011   HCT 40.1 11/23/2011   MCV 73.7* 11/23/2011   PLT 224 11/23/2011   Lab Results  Component Value Date   CREATININE 0.68 11/23/2011   BUN 11 11/23/2011   NA 138 11/23/2011   K 4.0 11/23/2011   CL 100 11/23/2011   CO2 31 11/23/2011     Impression:  The patient is tolerating radiation.  Plan:  Continue treatment as planned. Called in scripts for MMW with lidocaine, Carafate, lortab.

## 2011-11-24 NOTE — Progress Notes (Signed)
On 11/16/2011, a planned sinogram was completed on Lauren [name].  A total of 14 gantry rotations were required to deliver the planned dose which comprised 6 delivered field widths.  This represents 1 IMRT device.

## 2011-11-25 ENCOUNTER — Ambulatory Visit: Payer: Medicare Other

## 2011-11-25 ENCOUNTER — Ambulatory Visit
Admission: RE | Admit: 2011-11-25 | Discharge: 2011-11-25 | Disposition: A | Discharge status: Home / Self Care | Payer: Medicare Other | Source: Ambulatory Visit | Attending: Radiation Oncology | Admitting: Radiation Oncology

## 2011-11-25 NOTE — Progress Notes (Signed)
Handed patient prescription for Lortab and Carafate. Educated patient reference constipation. Educated patient reference use of both medications. Patient verbalized understanding of all things reviewed. All questions answered.

## 2011-11-26 ENCOUNTER — Ambulatory Visit
Admission: RE | Admit: 2011-11-26 | Discharge: 2011-11-26 | Disposition: A | Discharge status: Home / Self Care | Payer: Medicare Other | Source: Ambulatory Visit | Attending: Radiation Oncology | Admitting: Radiation Oncology

## 2011-11-27 ENCOUNTER — Encounter: Payer: Self-pay | Admitting: *Deleted

## 2011-11-27 ENCOUNTER — Ambulatory Visit
Admission: RE | Admit: 2011-11-27 | Discharge: 2011-11-27 | Disposition: A | Discharge status: Home / Self Care | Payer: Medicare Other | Source: Ambulatory Visit | Attending: Radiation Oncology | Admitting: Radiation Oncology

## 2011-11-27 NOTE — Progress Notes (Signed)
Pt c/o n/v x 2 past 24 hrs. Pt has Zofran, Compazine on her med list. She states she thought she could only take med for chemotherapy, not during radiation. Advised pt she can take these meds at any time she needs them, that one may be more effective than other. Advised she take as prescribed. Advised her to eat bland diet for next 12- 24 hrs, gave her food suggestions.  Pt verbalized understanding. Pt stated she did not want to see dr today.

## 2011-11-30 ENCOUNTER — Ambulatory Visit (HOSPITAL_BASED_OUTPATIENT_CLINIC_OR_DEPARTMENT_OTHER): Payer: Medicare Other

## 2011-11-30 ENCOUNTER — Ambulatory Visit: Payer: Medicare Other | Admitting: Nutrition

## 2011-11-30 ENCOUNTER — Ambulatory Visit: Payer: Medicare Other | Admitting: Oncology

## 2011-11-30 ENCOUNTER — Ambulatory Visit (HOSPITAL_BASED_OUTPATIENT_CLINIC_OR_DEPARTMENT_OTHER): Payer: Medicare Other | Admitting: Oncology

## 2011-11-30 ENCOUNTER — Telehealth: Payer: Self-pay | Admitting: Oncology

## 2011-11-30 ENCOUNTER — Other Ambulatory Visit (HOSPITAL_BASED_OUTPATIENT_CLINIC_OR_DEPARTMENT_OTHER): Payer: Medicare Other | Admitting: Lab

## 2011-11-30 ENCOUNTER — Ambulatory Visit
Admission: RE | Admit: 2011-11-30 | Discharge: 2011-11-30 | Disposition: A | Discharge status: Home / Self Care | Payer: Medicare Other | Source: Ambulatory Visit | Attending: Radiation Oncology | Admitting: Radiation Oncology

## 2011-11-30 VITALS — BP 119/70 | HR 74 | Temp 97.9°F | Ht 64.0 in | Wt 161.2 lb

## 2011-11-30 DIAGNOSIS — C187 Malignant neoplasm of sigmoid colon: Secondary | ICD-10-CM

## 2011-11-30 DIAGNOSIS — C3490 Malignant neoplasm of unspecified part of unspecified bronchus or lung: Secondary | ICD-10-CM

## 2011-11-30 DIAGNOSIS — C189 Malignant neoplasm of colon, unspecified: Secondary | ICD-10-CM

## 2011-11-30 DIAGNOSIS — R911 Solitary pulmonary nodule: Secondary | ICD-10-CM

## 2011-11-30 DIAGNOSIS — C3410 Malignant neoplasm of upper lobe, unspecified bronchus or lung: Secondary | ICD-10-CM

## 2011-11-30 DIAGNOSIS — Z5111 Encounter for antineoplastic chemotherapy: Secondary | ICD-10-CM

## 2011-11-30 LAB — CBC WITH DIFFERENTIAL/PLATELET
Eosinophils Absolute: 0.1 10*3/uL (ref 0.0–0.5)
HCT: 40.2 % (ref 34.8–46.6)
LYMPH%: 13 % — ABNORMAL LOW (ref 14.0–49.7)
MONO#: 0.2 10*3/uL (ref 0.1–0.9)
NEUT#: 2.8 10*3/uL (ref 1.5–6.5)
NEUT%: 80.5 % — ABNORMAL HIGH (ref 38.4–76.8)
Platelets: 188 10*3/uL (ref 145–400)
WBC: 3.5 10*3/uL — ABNORMAL LOW (ref 3.9–10.3)
lymph#: 0.5 10*3/uL — ABNORMAL LOW (ref 0.9–3.3)
nRBC: 0 % (ref 0–0)

## 2011-11-30 LAB — COMPREHENSIVE METABOLIC PANEL
ALT: 8 U/L (ref 0–35)
AST: 10 U/L (ref 0–37)
Albumin: 3.6 g/dL (ref 3.5–5.2)
BUN: 13 mg/dL (ref 6–23)
CO2: 28 mEq/L (ref 19–32)
Calcium: 9 mg/dL (ref 8.4–10.5)
Chloride: 106 mEq/L (ref 96–112)
Creatinine, Ser: 0.68 mg/dL (ref 0.50–1.10)
Potassium: 3.8 mEq/L (ref 3.5–5.3)

## 2011-11-30 MED ORDER — FAMOTIDINE IN NACL 20-0.9 MG/50ML-% IV SOLN
20.0000 mg | Freq: Once | INTRAVENOUS | Status: AC
Start: 2011-11-30 — End: 2011-11-30
  Administered 2011-11-30: 20 mg via INTRAVENOUS

## 2011-11-30 MED ORDER — DIPHENHYDRAMINE HCL 50 MG/ML IJ SOLN
50.0000 mg | Freq: Once | INTRAMUSCULAR | Status: AC
  Administered 2011-11-30: 50 mg via INTRAVENOUS

## 2011-11-30 MED ORDER — SODIUM CHLORIDE 0.9 % IV SOLN
243.6000 mg | Freq: Once | INTRAVENOUS | Status: AC
  Administered 2011-11-30: 240 mg via INTRAVENOUS
  Filled 2011-11-30: qty 24

## 2011-11-30 MED ORDER — PACLITAXEL CHEMO INJECTION 300 MG/50ML
45.0000 mg/m2 | Freq: Once | INTRAVENOUS | Status: AC
  Administered 2011-11-30: 84 mg via INTRAVENOUS
  Filled 2011-11-30: qty 14

## 2011-11-30 MED ORDER — DEXAMETHASONE SODIUM PHOSPHATE 4 MG/ML IJ SOLN
20.0000 mg | Freq: Once | INTRAMUSCULAR | Status: AC
  Administered 2011-11-30: 20 mg via INTRAVENOUS

## 2011-11-30 MED ORDER — SODIUM CHLORIDE 0.9 % IV SOLN
Freq: Once | INTRAVENOUS | Status: AC
  Administered 2011-11-30: 11:00:00 via INTRAVENOUS

## 2011-11-30 MED ORDER — ONDANSETRON 16 MG/50ML IVPB (CHCC)
16.0000 mg | Freq: Once | INTRAVENOUS | Status: AC
  Administered 2011-11-30: 16 mg via INTRAVENOUS

## 2011-11-30 NOTE — Telephone Encounter (Signed)
gv pt appt schedule for July/August. °

## 2011-11-30 NOTE — Patient Instructions (Addendum)
[facility] Discharge Instructions for Patients Receiving Chemotherapy  Today you received the following chemotherapy agents Taxol and Carboplatin  To help prevent nausea and vomiting after your treatment, we encourage you to take your nausea medication  Begin taking it at 7 pm and take it as often as prescribed for the next 24 to 72 hours.   If you develop nausea and vomiting that is not controlled by your nausea medication, call the clinic. If it is after clinic hours your family physician or the after hours number for the clinic or go to the Emergency Department.   BELOW ARE SYMPTOMS THAT SHOULD BE REPORTED IMMEDIATELY:  *FEVER GREATER THAN 100.5 F  *CHILLS WITH OR WITHOUT FEVER  NAUSEA AND VOMITING THAT IS NOT CONTROLLED WITH YOUR NAUSEA MEDICATION  *UNUSUAL SHORTNESS OF BREATH  *UNUSUAL BRUISING OR BLEEDING  TENDERNESS IN MOUTH AND THROAT WITH OR WITHOUT PRESENCE OF ULCERS  *URINARY PROBLEMS  *BOWEL PROBLEMS  UNUSUAL RASH Items with * indicate a potential emergency and should be followed up as soon as possible.  One of the nurses will contact you 24 hours after your treatment. Please let the nurse know about any problems that you may have experienced. Feel free to call the clinic you have any questions or concerns. The clinic phone number is [phone].   I have been informed and understand all the instructions given to me. I know to contact the clinic, my physician, or go to the Emergency Department if any problems should occur. I do not have any questions at this time, but understand that I may call the clinic during office hours   should I have any questions or need assistance in obtaining follow up care.    __________________________________________  _____________  __________ Signature of Patient or Authorized Representative            Date                   Time    __________________________________________ Nurse's Signature    

## 2011-11-30 NOTE — Progress Notes (Signed)
Lauren [name] reports that she had nausea and vomiting on Friday.  She was not able to eat much that day.  She did eat a little bit on Saturday and was back to her regular diet on Sunday.  Her weight is stable at 161.2 pounds today from 161.8 pounds July 8.  She prefers chocolate flavored nutrition supplements and is requesting some additional samples today.  NUTRITION DIAGNOSIS:  Food and nutrition related knowledge deficit has improved.  INTERVENTION:  I have encouraged Lauren [name] to take nausea medication as prescribed.  She was encouraged to continue increased fluids along with oral nutrition supplements 3 times a day as needed to promote weight maintenance.  I will provide some additional samples for her to take with her today.  MONITORING/EVALUATION/GOALS:  The patient is tolerating her oral diet and her weight is stable.  NEXT VISIT:  Monday, July 22.  ______________________________ Zenovia Jarred, RD,CSO,LDN Clinical Nutrition Specialist BN/MEDQ  D:  11/30/2011  T:  11/30/2011  Job:  [id number]

## 2011-11-30 NOTE — Progress Notes (Signed)
[facility]  Telephone:([phone] [phone]   OFFICE PROGRESS NOTE   Cc:  CHAMBERLAIN,RACHEL, MD   PAST DIAGNOSIS AND PAST THERAPY:  1. 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 Her post resection surveillance colonoscopy onOctober 14, 2009, by Dr. Christella Hartigan was negative. 2. pT1a, pN0 well differentiated adenocarcinoma of lung; s/p left upper lobectomy on 06/16/2010.  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   CURRENT THERAPY: started concurrent chemoradiation on 11/16/2011 with daily XRT and weekly Carboplatin/Taxol.   INTERVAL HISTORY: Lauren [name] 61 y.o. female returns for regular follow up with a daughter and a sister.  She has had two cycles of chemo radiation and has been doing relatively well.  She has mild fatigue; however, she is still very independent of activities of daily living including cooking.  She did not have nausea/vomiting until this past Friday.  However, she only took Zofran and did not take Compazine.  Her nausea/vomiting resolved over the weekend.  She still has low appetite; however, she has not lost much weight.  She still smokes cigarette about 1/3 pack per day.  She said that she cannot stop smoking because she feels nervous and anxious during the day.  She was on Paxil in the past; which was discontinued by her PCP around 2007.  She is not ready emotionally to start another anxiolytic med at this time due to not wanting to take too many medications.   Patient denies fever, headache, visual changes, confusion, drenching night sweats, palpable lymph node swelling, mucositis, odynophagia, dysphagia, nausea vomiting, jaundice, chest pain, palpitation, shortness of breath, dyspnea on exertion, productive cough, gum bleeding, epistaxis, hematemesis,  hemoptysis, abdominal pain, abdominal swelling, early satiety, melena, hematochezia, hematuria, skin rash, spontaneous bleeding, joint swelling, joint pain, heat or cold intolerance, bowel bladder incontinence, back pain, focal motor weakness, paresthesia, suicidal or homocidal ideation, feeling hopelessness.;  Past Medical History  Diagnosis Date  . Depression   . Hyperlipidemia   . Hypertension   . Colon cancer   . Lung cancer     PET scan 04/28/2010; primary: increase in size 02/2010  . Lung nodule     FNA ordered for 04/02/10 by Hanadi Stanly>pos Ca  . Chronic folliculitis     of groin  . Fibrocystic breast changes   . Family history of trichomonal vaginitis 05/2005  . Postmenopausal   . Depressive disorder   . Hypercholesterolemia   . GERD (gastroesophageal reflux disease)   . Cerebral aneurysm   . Back pain   . Shortness of breath   . COPD (chronic obstructive pulmonary disease)   . Coronary artery disease   . Arthritis   . Weight loss 10/22/2011    Past Surgical History  Procedure Date  . Colon surgery   . Tubal ligation   . Lung lobectomy 2012  . Hernia repair   . Breast surgery     Bil lumpectomy  . Back surgery   . Mediastinoscopy 10/19/2011    Procedure: MEDIASTINOSCOPY;  Surgeon: Alleen Borne, MD;  Location: [facility] OR;  Service: Thoracic;  Laterality: N/A;  . Cardiac cath x3     Current Outpatient Prescriptions  Medication Sig Dispense Refill  . albuterol (PROVENTIL HFA;VENTOLIN HFA) 108 (90 BASE) MCG/ACT inhaler Inhale 2 puffs into the lungs every 6 (six) hours as needed.      Marland Kitchen  Alum & Mag Hydroxide-Simeth (MAGIC MOUTHWASH W/LIDOCAINE) SOLN Take 5 mLs by mouth 3 (three) times daily as needed (15-20 minutes before eating).  480 mL  2  . aspirin 81 MG EC tablet Take 81 mg by mouth daily.      . cholecalciferol (VITAMIN D) 1000 UNITS tablet Take 1,000 Units by mouth daily.      . cloNIDine (CATAPRES) 0.1 MG tablet Take 0.1 mg by mouth at bedtime.      . diclofenac sodium  (VOLTAREN) 1 % GEL Apply 1 application topically 4 (four) times daily. As needed for pain      . emollient (BIAFINE) cream Apply topically as needed.      . fish oil-omega-3 fatty acids 1000 MG capsule Take 2,400 g by mouth daily. Also contains FLAX BORAGE      . hydrochlorothiazide (HYDRODIURIL) 25 MG tablet Take 25 mg by mouth daily.      Marland Kitchen HYDROcodone-acetaminophen (LORTAB) 7.5-500 MG/15ML solution Take 15 mLs by mouth every 6 (six) hours as needed for pain.  480 mL  0  . nebivolol (BYSTOLIC) 10 MG tablet Take 10 mg by mouth daily.      . nitroGLYCERIN (NITROSTAT) 0.4 MG SL tablet Place 0.4 mg under the tongue every 5 (five) minutes as needed.      Marland Kitchen omeprazole (PRILOSEC) 20 MG capsule Take 20 mg by mouth daily.        . ondansetron (ZOFRAN) 8 MG tablet Take 1 tab two times a day starting the day after chemo for 3 days. Then take 1 tab two times a day as needed for nausea or vomiting.  30 tablet  1  . pravastatin (PRAVACHOL) 40 MG tablet Take 80 mg by mouth daily.      . prochlorperazine (COMPAZINE) 10 MG tablet Take 1 tablet (10 mg total) by mouth every 6 (six) hours as needed (Nausea or vomiting).  30 tablet  1  . Spirulina POWD by Does not apply route daily.      . sucralfate (CARAFATE) 1 G tablet Take 1 tablet (1 g total) by mouth 4 (four) times daily. Dissolve 1 tablet in 8 oz water and take po qid  60 tablet  1  . traMADol (ULTRAM) 50 MG tablet Take 50 mg by mouth every 6 (six) hours as needed. As needed for pain.      Marland Kitchen zolpidem (AMBIEN) 5 MG tablet Take 5 mg by mouth at bedtime as needed.      Marland Kitchen DISCONTD: buPROPion (WELLBUTRIN XL) 150 MG 24 hr tablet Take 1 tablet (150 mg total) by mouth 2 (two) times daily.  60 tablet  5    ALLERGIES:  is allergic to lisinopril.  REVIEW OF SYSTEMS:  The rest of the 14-point review of system was negative.   Filed Vitals:   11/30/11 0853  BP: 119/70  Pulse: 74  Temp: 97.9 F (36.6 C)   Wt Readings from Last 3 Encounters:  11/30/11 161 lb 3.2  oz (73.12 kg)  11/23/11 161 lb 12.8 oz (73.392 kg)  11/17/11 166 lb 11.2 oz (75.615 kg)   ECOG Performance status: 1  PHYSICAL EXAMINATION:  General: thin-appearing woman, in no acute distress. Eyes: no scleral icterus. ENT: There were no oropharyngeal lesions. Neck was without thyromegaly. Mediastinal biopsy site has healed up completely without erythema, pain, purulent discharge. Lymphatics: Negative cervical, supraclavicular or axillary adenopathy. Respiratory: lungs were clear bilaterally without wheezing or crackles. Cardiovascular: Regular rate and rhythm, S1/S2, without murmur, rub or  gallop. There was no pedal edema. GI: abdomen was soft, flat, nontender, nondistended, without organomegaly. Muscoloskeletal: no spinal tenderness of palpation of vertebral spine. Skin exam was without echymosis, petichae. Neuro exam was nonfocal. Patient was able to get on and off exam table without assistance. Gait was normal. Patient was alerted and oriented. Attention was good. Language was appropriate. Mood was normal without depression. Speech was not pressured. Thought content was not tangential.    LABORATORY/RADIOLOGY DATA:  Lab Results  Component Value Date   WBC 3.5* 11/30/2011   HGB 13.4 11/30/2011   HCT 40.2 11/30/2011   PLT 188 11/30/2011   GLUCOSE 93 11/23/2011   CHOL 190 12/08/2010   TRIG 104 12/08/2010   HDL 39* 12/08/2010   LDLDIRECT 111* 02/27/2010   LDLCALC 130* 12/08/2010   ALKPHOS 104 10/19/2011   ALT 5 10/19/2011   AST 9 10/19/2011   NA 138 11/23/2011   K 4.0 11/23/2011   CL 100 11/23/2011   CREATININE 0.68 11/23/2011   BUN 11 11/23/2011   CO2 31 11/23/2011   INR 1.10 10/19/2011    ASSESSMENT AND PLAN:   1. Recurrent Lung cancer:   - Doing relatively well with chemo.  She has grade 1 nausea/vomiting; grade 1 fatigue.  None of these warrants dose reduction of chemo yet.  I advised her to proceed with the 3rd weekly cycle of Carboplatin/Taxol today.   2. History of colon cancer: She has no  evidence of recurrent or metastatic disease for her colon cancer. Surveillance for her history of colon cancer entails once-a-year CT scan for 5 years' total, with the next CT abdomen in November 2013. She had a surveillance colonoscopy in October 2012 by Dr Christella Hartigan, which showed polyps. Per his recommendation, the next colonoscopy is due in Oct 2015 but sooner if she has symptoms.   3. Smoking: I advised her to try smokeless cigarettes again.    4. Hypertension: She is on HCTZ and clonidine per PCP.   5. Hypercholesterolemia: She is on pravastatin per PCP.   6. Depression/anxiety: She is not ready to resume anxiolytic med at this time.  I advised her and her relatives that this may help her quit.  She wants to think about it.   7. Anorexia: Due to new diagnosis of recurrent lung cancer. I referred her to nutrition per her request.   8. Mild Calorie/protein malnutrition:  Stable.    9.  Nausea/vomting:  Grade 1 from chemo.  I advised her again to rotate through both Zofran and Compazine for nausea/vomiting, prn.   If this worsens, I may consider changing IV antiemetic premed to Emend and Aloxi.    10. Follow up: in one week.

## 2011-12-01 ENCOUNTER — Ambulatory Visit
Admission: RE | Admit: 2011-12-01 | Discharge: 2011-12-01 | Disposition: A | Discharge status: Home / Self Care | Payer: Medicare Other | Source: Ambulatory Visit | Attending: Radiation Oncology | Admitting: Radiation Oncology

## 2011-12-01 ENCOUNTER — Encounter: Payer: Self-pay | Admitting: Radiation Oncology

## 2011-12-01 VITALS — BP 131/81 | HR 78 | Resp 18 | Wt 162.5 lb

## 2011-12-01 DIAGNOSIS — C3490 Malignant neoplasm of unspecified part of unspecified bronchus or lung: Secondary | ICD-10-CM

## 2011-12-01 NOTE — Progress Notes (Signed)
Weekly Management Note Current Dose: 22 Gy  Projected Dose:60 Gy   Narrative:  The patient presents for routine under treatment assessment.  CBCT/MVCT images/Port film x-rays were reviewed.  The chart was checked. Doing well. Dysphagia controlled with hydrocodone. Carafate made her sick and she is just swishing the magic mouthwash which of course made her mouth numb.  Eating better. Cough continues but is stable.   Physical Findings:  Unchanged  Vitals:  Filed Vitals:   12/01/11 1425  BP: 131/81  Pulse: 78  Resp: 18   Weight:  Wt Readings from Last 3 Encounters:  12/01/11 162 lb 8 oz (73.71 kg)  11/30/11 161 lb 3.2 oz (73.12 kg)  11/23/11 161 lb 12.8 oz (73.392 kg)   Lab Results  Component Value Date   WBC 3.5* 11/30/2011   HGB 13.4 11/30/2011   HCT 40.2 11/30/2011   MCV 74.0* 11/30/2011   PLT 188 11/30/2011   Lab Results  Component Value Date   CREATININE 0.68 11/30/2011   BUN 13 11/30/2011   NA 140 11/30/2011   K 3.8 11/30/2011   CL 106 11/30/2011   CO2 28 11/30/2011     Impression:  The patient is tolerating radiation.  Plan:  Continue treatment as planned. RN performed medication education. Pt will use stool softener regularly if she is taking hydrocodone.

## 2011-12-01 NOTE — Progress Notes (Signed)
Patient presents to the clinic today unaccompanied for an under treat visit with Dr. Michell Heinrich. Patient is alert and oriented to person, place, and time. No distress noted. Steady gait noted. Pleasant affect noted. Patient denies pain at this time. Patient reports only an occasional productive cough with only thin clear sputum. Patient reports shortness of breath with exertion. Patient reports having a bowel movement this morning following constipation for one week. Patient reports using Biafine on her treated skin as directed. Only faint hyperpigmentation without desquamation noted. Esophagitis has improved greatly. Reported all findings to Dr. Michell Heinrich.

## 2011-12-02 ENCOUNTER — Ambulatory Visit: Payer: Medicare Other

## 2011-12-02 ENCOUNTER — Ambulatory Visit
Admission: RE | Admit: 2011-12-02 | Discharge: 2011-12-02 | Disposition: A | Discharge status: Home / Self Care | Payer: Medicare Other | Source: Ambulatory Visit | Attending: Radiation Oncology | Admitting: Radiation Oncology

## 2011-12-03 ENCOUNTER — Ambulatory Visit
Admission: RE | Admit: 2011-12-03 | Discharge: 2011-12-03 | Disposition: A | Discharge status: Home / Self Care | Payer: Medicare Other | Source: Ambulatory Visit | Attending: Radiation Oncology | Admitting: Radiation Oncology

## 2011-12-04 ENCOUNTER — Other Ambulatory Visit: Payer: Self-pay | Admitting: *Deleted

## 2011-12-04 ENCOUNTER — Ambulatory Visit
Admission: RE | Admit: 2011-12-04 | Discharge: 2011-12-04 | Disposition: A | Discharge status: Home / Self Care | Payer: Medicare Other | Source: Ambulatory Visit | Attending: Radiation Oncology | Admitting: Radiation Oncology

## 2011-12-07 ENCOUNTER — Other Ambulatory Visit (HOSPITAL_BASED_OUTPATIENT_CLINIC_OR_DEPARTMENT_OTHER): Payer: Medicare Other | Admitting: Lab

## 2011-12-07 ENCOUNTER — Encounter: Payer: Self-pay | Admitting: Oncology

## 2011-12-07 ENCOUNTER — Ambulatory Visit (HOSPITAL_BASED_OUTPATIENT_CLINIC_OR_DEPARTMENT_OTHER): Payer: Medicare Other | Admitting: Oncology

## 2011-12-07 ENCOUNTER — Ambulatory Visit: Payer: Medicare Other | Admitting: Nutrition

## 2011-12-07 ENCOUNTER — Ambulatory Visit (HOSPITAL_BASED_OUTPATIENT_CLINIC_OR_DEPARTMENT_OTHER): Payer: Medicare Other

## 2011-12-07 ENCOUNTER — Ambulatory Visit
Admission: RE | Admit: 2011-12-07 | Discharge: 2011-12-07 | Disposition: A | Discharge status: Home / Self Care | Payer: Medicare Other | Source: Ambulatory Visit | Attending: Radiation Oncology | Admitting: Radiation Oncology

## 2011-12-07 VITALS — BP 120/75 | HR 70 | Temp 97.5°F | Wt 160.1 lb

## 2011-12-07 DIAGNOSIS — C3490 Malignant neoplasm of unspecified part of unspecified bronchus or lung: Secondary | ICD-10-CM

## 2011-12-07 DIAGNOSIS — C3410 Malignant neoplasm of upper lobe, unspecified bronchus or lung: Secondary | ICD-10-CM

## 2011-12-07 DIAGNOSIS — C187 Malignant neoplasm of sigmoid colon: Secondary | ICD-10-CM

## 2011-12-07 DIAGNOSIS — Z5111 Encounter for antineoplastic chemotherapy: Secondary | ICD-10-CM

## 2011-12-07 DIAGNOSIS — F341 Dysthymic disorder: Secondary | ICD-10-CM

## 2011-12-07 DIAGNOSIS — R112 Nausea with vomiting, unspecified: Secondary | ICD-10-CM

## 2011-12-07 LAB — CBC WITH DIFFERENTIAL/PLATELET
Basophils Absolute: 0 10*3/uL (ref 0.0–0.1)
Eosinophils Absolute: 0.1 10*3/uL (ref 0.0–0.5)
HCT: 38.2 % (ref 34.8–46.6)
HGB: 12.6 g/dL (ref 11.6–15.9)
MCH: 24.5 pg — ABNORMAL LOW (ref 25.1–34.0)
NEUT#: 2 10*3/uL (ref 1.5–6.5)
NEUT%: 69.7 % (ref 38.4–76.8)
RDW: 17.3 % — ABNORMAL HIGH (ref 11.2–14.5)
lymph#: 0.4 10*3/uL — ABNORMAL LOW (ref 0.9–3.3)

## 2011-12-07 LAB — COMPREHENSIVE METABOLIC PANEL
Albumin: 3.8 g/dL (ref 3.5–5.2)
Alkaline Phosphatase: 87 U/L (ref 39–117)
BUN: 13 mg/dL (ref 6–23)
CO2: 26 mEq/L (ref 19–32)
Calcium: 9.3 mg/dL (ref 8.4–10.5)
Chloride: 105 mEq/L (ref 96–112)
Glucose, Bld: 91 mg/dL (ref 70–99)
Potassium: 3.7 mEq/L (ref 3.5–5.3)
Sodium: 138 mEq/L (ref 135–145)
Total Protein: 6.7 g/dL (ref 6.0–8.3)

## 2011-12-07 MED ORDER — SODIUM CHLORIDE 0.9 % IV SOLN
Freq: Once | INTRAVENOUS | Status: AC
Start: 2011-12-07 — End: 2011-12-07
  Administered 2011-12-07: 11:00:00 via INTRAVENOUS

## 2011-12-07 MED ORDER — COLD PACK MISC ONCOLOGY
1.0000 | Freq: Once | Status: DC | PRN
  Filled 2011-12-07: qty 1

## 2011-12-07 MED ORDER — DEXAMETHASONE SODIUM PHOSPHATE 4 MG/ML IJ SOLN
20.0000 mg | Freq: Once | INTRAMUSCULAR | Status: AC
  Administered 2011-12-07: 20 mg via INTRAVENOUS

## 2011-12-07 MED ORDER — ONDANSETRON 16 MG/50ML IVPB (CHCC)
16.0000 mg | Freq: Once | INTRAVENOUS | Status: AC
  Administered 2011-12-07: 16 mg via INTRAVENOUS

## 2011-12-07 MED ORDER — PACLITAXEL CHEMO INJECTION 300 MG/50ML
45.0000 mg/m2 | Freq: Once | INTRAVENOUS | Status: AC
  Administered 2011-12-07: 84 mg via INTRAVENOUS
  Filled 2011-12-07: qty 14

## 2011-12-07 MED ORDER — SODIUM CHLORIDE 0.9 % IV SOLN
243.6000 mg | Freq: Once | INTRAVENOUS | Status: AC
  Administered 2011-12-07: 240 mg via INTRAVENOUS
  Filled 2011-12-07: qty 24

## 2011-12-07 MED ORDER — FAMOTIDINE IN NACL 20-0.9 MG/50ML-% IV SOLN
20.0000 mg | Freq: Once | INTRAVENOUS | Status: AC
  Administered 2011-12-07: 20 mg via INTRAVENOUS

## 2011-12-07 MED ORDER — DIPHENHYDRAMINE HCL 50 MG/ML IJ SOLN
50.0000 mg | Freq: Once | INTRAMUSCULAR | Status: AC
  Administered 2011-12-07: 50 mg via INTRAVENOUS

## 2011-12-07 NOTE — Patient Instructions (Addendum)
[facility] Discharge Instructions for Patients Receiving Chemotherapy  Today you received the following chemotherapy agents :  Taxol, Carboplatin.  To help prevent nausea and vomiting after your treatment, we encourage you to take your nausea medication as instructed by your physician , and as needed to help with nausea/vomiting.    If you develop nausea and vomiting that is not controlled by your nausea medication, call the clinic. If it is after clinic hours your family physician or the after hours number for the clinic or go to the Emergency Department.   BELOW ARE SYMPTOMS THAT SHOULD BE REPORTED IMMEDIATELY:  *FEVER GREATER THAN 100.5 F  *CHILLS WITH OR WITHOUT FEVER  NAUSEA AND VOMITING THAT IS NOT CONTROLLED WITH YOUR NAUSEA MEDICATION  *UNUSUAL SHORTNESS OF BREATH  *UNUSUAL BRUISING OR BLEEDING  TENDERNESS IN MOUTH AND THROAT WITH OR WITHOUT PRESENCE OF ULCERS  *URINARY PROBLEMS  *BOWEL PROBLEMS  UNUSUAL RASH Items with * indicate a potential emergency and should be followed up as soon as possible.  One of the nurses will contact you 24 hours after your treatment. Please let the nurse know about any problems that you may have experienced. Feel free to call the clinic you have any questions or concerns. The clinic phone number is [phone].   I have been informed and understand all the instructions given to me. I know to contact the clinic, my physician, or go to the Emergency Department if any problems should occur. I do not have any questions at this time, but understand that I may call the clinic during office hours   should I have any questions or need assistance in obtaining follow up care.    __________________________________________  _____________  __________ Signature of Patient or Authorized Representative            Date                   Time    __________________________________________ Nurse's Signature

## 2011-12-07 NOTE — Progress Notes (Signed)
[facility]  Telephone:(336) [phone] Fax:(336) [phone]   OFFICE PROGRESS NOTE   Cc:  CHAMBERLAIN,RACHEL, MD   PAST DIAGNOSIS AND PAST THERAPY:  1. 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 Her post resection surveillance colonoscopy onOctober 14, 2009, by Dr. Christella Hartigan was negative. 2. pT1a, pN0 well differentiated adenocarcinoma of lung; s/p left upper lobectomy on 06/16/2010.  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   CURRENT THERAPY: started concurrent chemoradiation on 11/16/2011 with daily XRT and weekly Carboplatin/Taxol.   INTERVAL HISTORY: Lauren [name] 61 y.o. female returns for regular follow.  She has had three cycles of chemo radiation and has been doing relatively well.  She has mild fatigue; however, she is still very independent of activities of daily living including cooking.  Nausea/vomiting is better. Using Zofran as needed. She still has low appetite; however, she has not lost much weight.  She is no longer smoking cigarettes and is using an e-cigarette. She has some heartburn symptoms. Using Prilosec, but is not using her Carafate.  Patient denies fever, headache, visual changes, confusion, drenching night sweats, palpable lymph node swelling, mucositis, odynophagia, dysphagia, nausea vomiting, jaundice, chest pain, palpitation, shortness of breath, dyspnea on exertion, productive cough, gum bleeding, epistaxis, hematemesis, hemoptysis, abdominal pain, abdominal swelling, early satiety, melena, hematochezia, hematuria, skin rash, spontaneous bleeding, joint swelling, joint pain, heat or cold intolerance, bowel bladder incontinence, back pain, focal motor weakness, paresthesia, suicidal or homocidal ideation, feeling hopelessness.;  Past Medical History  Diagnosis  Date  . Depression   . Hyperlipidemia   . Hypertension   . Colon cancer   . Lung cancer     PET scan 04/28/2010; primary: increase in size 02/2010  . Lung nodule     FNA ordered for 04/02/10 by HA>pos Ca  . Chronic folliculitis     of groin  . Fibrocystic breast changes   . Family history of trichomonal vaginitis 05/2005  . Postmenopausal   . Depressive disorder   . Hypercholesterolemia   . GERD (gastroesophageal reflux disease)   . Cerebral aneurysm   . Back pain   . Shortness of breath   . COPD (chronic obstructive pulmonary disease)   . Coronary artery disease   . Arthritis   . Weight loss 10/22/2011    Past Surgical History  Procedure Date  . Colon surgery   . Tubal ligation   . Lung lobectomy 2012  . Hernia repair   . Breast surgery     Bil lumpectomy  . Back surgery   . Mediastinoscopy 10/19/2011    Procedure: MEDIASTINOSCOPY;  Surgeon: Alleen Borne, MD;  Location: [facility] OR;  Service: Thoracic;  Laterality: N/A;  . Cardiac cath x3     Current Outpatient Prescriptions  Medication Sig Dispense Refill  . albuterol (PROVENTIL HFA;VENTOLIN HFA) 108 (90 BASE) MCG/ACT inhaler Inhale 2 puffs into the lungs every 6 (six) hours as needed.      . Alum & Mag Hydroxide-Simeth (MAGIC MOUTHWASH W/LIDOCAINE) SOLN Take 5 mLs by mouth 3 (three) times daily as needed (15-20 minutes before eating).  480 mL  2  . aspirin 81 MG EC tablet Take 81 mg by mouth daily.      . cholecalciferol (VITAMIN D) 1000 UNITS tablet Take 1,000 Units by mouth daily.      . cloNIDine (CATAPRES)  0.1 MG tablet Take 0.1 mg by mouth at bedtime.      . diclofenac sodium (VOLTAREN) 1 % GEL Apply 1 application topically 4 (four) times daily. As needed for pain      . emollient (BIAFINE) cream Apply topically as needed.      . fish oil-omega-3 fatty acids 1000 MG capsule Take 2,400 g by mouth daily. Also contains FLAX BORAGE      . hydrochlorothiazide (HYDRODIURIL) 25 MG tablet Take 25 mg by mouth daily.      .  nebivolol (BYSTOLIC) 10 MG tablet Take 10 mg by mouth daily.      . nitroGLYCERIN (NITROSTAT) 0.4 MG SL tablet Place 0.4 mg under the tongue every 5 (five) minutes as needed.      Marland Kitchen omeprazole (PRILOSEC) 20 MG capsule Take 20 mg by mouth daily.        . ondansetron (ZOFRAN) 8 MG tablet Take 1 tab two times a day starting the day after chemo for 3 days. Then take 1 tab two times a day as needed for nausea or vomiting.  30 tablet  1  . pravastatin (PRAVACHOL) 40 MG tablet Take 80 mg by mouth daily.      . prochlorperazine (COMPAZINE) 10 MG tablet Take 1 tablet (10 mg total) by mouth every 6 (six) hours as needed (Nausea or vomiting).  30 tablet  1  . Spirulina POWD by Does not apply route daily.      . sucralfate (CARAFATE) 1 G tablet Take 1 tablet (1 g total) by mouth 4 (four) times daily. Dissolve 1 tablet in 8 oz water and take po qid  60 tablet  1  . traMADol (ULTRAM) 50 MG tablet Take 50 mg by mouth every 6 (six) hours as needed. As needed for pain.      Marland Kitchen zolpidem (AMBIEN) 5 MG tablet Take 5 mg by mouth at bedtime as needed.      Marland Kitchen DISCONTD: buPROPion (WELLBUTRIN XL) 150 MG 24 hr tablet Take 1 tablet (150 mg total) by mouth 2 (two) times daily.  60 tablet  5   No current facility-administered medications for this visit.   Facility-Administered Medications Ordered in Other Visits  Medication Dose Route Frequency Provider Last Rate Last Dose  . 0.9 %  sodium chloride infusion   Intravenous Once Exie Parody, MD 20 mL/hr at 12/07/11 1120    . CARBOplatin (PARAPLATIN) 240 mg in sodium chloride 0.9 % 100 mL chemo infusion  240 mg Intravenous Once Exie Parody, MD      . Cold Pack 1 packet  1 packet Topical Once PRN Exie Parody, MD      . dexamethasone (DECADRON) injection 20 mg  20 mg Intravenous Once Exie Parody, MD   20 mg at 12/07/11 1125  . diphenhydrAMINE (BENADRYL) injection 50 mg  50 mg Intravenous Once Exie Parody, MD   50 mg at 12/07/11 1125  . famotidine (PEPCID) IVPB 20 mg  20 mg Intravenous Once  Exie Parody, MD   20 mg at 12/07/11 1145  . ondansetron (ZOFRAN) IVPB 16 mg  16 mg Intravenous Once Exie Parody, MD   16 mg at 12/07/11 1125  . PACLitaxel (TAXOL) 84 mg in dextrose 5 % 250 mL chemo infusion (</= 80mg /m2)  45 mg/m2 (Treatment Plan Actual) Intravenous Once Exie Parody, MD 264 mL/hr at 12/07/11 1225 84 mg at 12/07/11 1225    ALLERGIES:  is allergic to lisinopril.  REVIEW OF SYSTEMS:  The rest of the 14-point review of system was negative.   Filed Vitals:   12/07/11 1005  BP: 120/75  Pulse: 70  Temp: 97.5 F (36.4 C)   Wt Readings from Last 3 Encounters:  12/07/11 160 lb 1.6 oz (72.621 kg)  12/01/11 162 lb 8 oz (73.71 kg)  11/30/11 161 lb 3.2 oz (73.12 kg)   ECOG Performance status: 1  PHYSICAL EXAMINATION:  General: thin-appearing woman, in no acute distress. Eyes: no scleral icterus. ENT: There were no oropharyngeal lesions. Neck was without thyromegaly. Mediastinal biopsy site has healed up completely without erythema, pain, purulent discharge. Lymphatics: Negative cervical, supraclavicular or axillary adenopathy. Respiratory: lungs were clear bilaterally without wheezing or crackles. Cardiovascular: Regular rate and rhythm, S1/S2, without murmur, rub or gallop. There was no pedal edema. GI: abdomen was soft, flat, nontender, nondistended, without organomegaly. Muscoloskeletal: no spinal tenderness of palpation of vertebral spine. Skin exam was without echymosis, petichae. Neuro exam was nonfocal. Patient was able to get on and off exam table without assistance. Gait was normal. Patient was alerted and oriented. Attention was good. Language was appropriate. Mood was normal without depression. Speech was not pressured. Thought content was not tangential.    LABORATORY/RADIOLOGY DATA:  Lab Results  Component Value Date   WBC 2.8* 12/07/2011   HGB 12.6 12/07/2011   HCT 38.2 12/07/2011   PLT 167 12/07/2011   GLUCOSE 88 11/30/2011   CHOL 190 12/08/2010   TRIG 104 12/08/2010   HDL  39* 12/08/2010   LDLDIRECT 111* 02/27/2010   LDLCALC 130* 12/08/2010   ALKPHOS 83 11/30/2011   ALT 8 11/30/2011   AST 10 11/30/2011   NA 140 11/30/2011   K 3.8 11/30/2011   CL 106 11/30/2011   CREATININE 0.68 11/30/2011   BUN 13 11/30/2011   CO2 28 11/30/2011   INR 1.10 10/19/2011    ASSESSMENT AND PLAN:   1. Recurrent Lung cancer:   - Doing relatively well with chemo.  She has grade 1 nausea/vomiting; grade 1 fatigue.  None of these warrants dose reduction of chemo yet.  I advised her to proceed with the 4th weekly cycle of Carboplatin/Taxol today.   2. History of colon cancer: She has no evidence of recurrent or metastatic disease for her colon cancer. Surveillance for her history of colon cancer entails once-a-year CT scan for 5 years' total, with the next CT abdomen in November 2013. She had a surveillance colonoscopy in October 2012 by Dr Christella Hartigan, which showed polyps. Per his recommendation, the next colonoscopy is due in Oct 2015 but sooner if she has symptoms.   3. Smoking: She stopped smoking this past week. Using e-cigarettes now.  4. Hypertension: She is on HCTZ and clonidine per PCP.   5. Hypercholesterolemia: She is on pravastatin per PCP.   6. Depression/anxiety: She is not ready to resume anxiolytic med at this time.   7. Anorexia: Due to new diagnosis of recurrent lung cancer. Weight is stable. She is followed by Vernell Leep.   8. Mild Calorie/protein malnutrition:  Stable.    9.  Nausea/vomting:  Grade 1 from chemo.  I advised her again to rotate through both Zofran and Compazine for nausea/vomiting, prn.   If this worsens, I may consider changing IV antiemetic premed to Emend and Aloxi.    10. Esophagitis: Due to XRT. Continue Omeprazole. I have encouraged her to use Carafate QID.  11. Follow up: in one week.

## 2011-12-07 NOTE — Progress Notes (Signed)
Lauren [name] reports that she is eating okay.  Her weight has decreased to 160.1 pounds July 22nd from 162.5 pounds July 16th.  She had some nausea and vomiting.  She took her Zofran but not her Compazine.  She is trying to drink Ensure Plus 3 times a day.  She denies other nutrition side effects.  NUTRITION DIAGNOSIS:  Food and nutrition-related knowledge deficit continues.  INTERVENTION:  I have encouraged the patient to take nausea medications as prescribed.  I have recommended the patient continue Ensure Plus t.i.d. to q.i.d. between meals to promote weight maintenance.  I have provided her with some additional Ensure Plus for her to take with her today.  MONITORING/EVALUATION/GOALS:  The patient is tolerating her oral diet, however has not been able to stabilize her weight.  NEXT VISIT:  Monday, August 5th, during chemotherapy.   ______________________________ Zenovia Jarred, RD, CSO, LDN Clinical Nutrition Specialist BN/MEDQ  D:  12/07/2011  T:  12/07/2011  Job:  [id number]

## 2011-12-08 ENCOUNTER — Ambulatory Visit
Admission: RE | Admit: 2011-12-08 | Discharge: 2011-12-08 | Disposition: A | Discharge status: Home / Self Care | Payer: Medicare Other | Source: Ambulatory Visit | Attending: Radiation Oncology | Admitting: Radiation Oncology

## 2011-12-08 VITALS — Wt 160.8 lb

## 2011-12-08 DIAGNOSIS — C3490 Malignant neoplasm of unspecified part of unspecified bronchus or lung: Secondary | ICD-10-CM

## 2011-12-08 MED ORDER — RADIAPLEXRX EX GEL
Freq: Once | CUTANEOUS | Status: AC
  Administered 2011-12-08: 16:00:00 via TOPICAL

## 2011-12-08 NOTE — Addendum Note (Signed)
Encounter addended by: Agnes Lawrence, RN on: 12/08/2011  3:37 PM<BR>     Documentation filed: Inpatient MAR, Orders

## 2011-12-08 NOTE — Progress Notes (Signed)
Weekly Management Note Current Dose:  32 Gy  Projected Dose: 60 Gy   Narrative:  The patient presents for routine under treatment assessment.  CBCT/MVCT images/Port film x-rays were reviewed.  The chart was checked. Doing well. No complaints. Quit smoking x 2 days which she is really excited about. No dysphagia. Eating well. Some irritation on her moles in her right back when using body wash. Improved by using radiaplex.  Physical Findings: Weight: 160 lb 12.8 oz (72.938 kg). Unchanged. No darkness of skin.   Impression:  The patient is tolerating radiation.  Plan:  Continue treatment as planned. Continue radiaplex. Congratulations on quitting smoking!!

## 2011-12-08 NOTE — Progress Notes (Signed)
HERE TODAY FOR PUT OF RIGHT LUNG.  SKIN LOOKS GOOD. HAS AREA ON LEFT SIDE OF BACK THAT BURNS AND IS FAINT RED.  HAS A COUPLE OF MOLES ON BACK THAT SHE THINKS MAY BE CAUSING SOME DISCOMFORT.  USING RASIAPLEX WITH RELIEF

## 2011-12-09 ENCOUNTER — Ambulatory Visit
Admission: RE | Admit: 2011-12-09 | Discharge: 2011-12-09 | Disposition: A | Discharge status: Home / Self Care | Payer: Medicare Other | Source: Ambulatory Visit | Attending: Radiation Oncology | Admitting: Radiation Oncology

## 2011-12-10 ENCOUNTER — Other Ambulatory Visit: Payer: Self-pay | Admitting: Oncology

## 2011-12-10 ENCOUNTER — Ambulatory Visit
Admission: RE | Admit: 2011-12-10 | Discharge: 2011-12-10 | Disposition: A | Discharge status: Home / Self Care | Payer: Medicare Other | Source: Ambulatory Visit | Attending: Radiation Oncology | Admitting: Radiation Oncology

## 2011-12-10 DIAGNOSIS — C3490 Malignant neoplasm of unspecified part of unspecified bronchus or lung: Secondary | ICD-10-CM

## 2011-12-11 ENCOUNTER — Ambulatory Visit
Admission: RE | Admit: 2011-12-11 | Discharge: 2011-12-11 | Disposition: A | Discharge status: Home / Self Care | Payer: Medicare Other | Source: Ambulatory Visit | Attending: Radiation Oncology | Admitting: Radiation Oncology

## 2011-12-11 ENCOUNTER — Ambulatory Visit (HOSPITAL_BASED_OUTPATIENT_CLINIC_OR_DEPARTMENT_OTHER): Payer: Medicare Other | Admitting: Oncology

## 2011-12-11 ENCOUNTER — Other Ambulatory Visit (HOSPITAL_BASED_OUTPATIENT_CLINIC_OR_DEPARTMENT_OTHER): Payer: Medicare Other | Admitting: Lab

## 2011-12-11 ENCOUNTER — Encounter: Payer: Self-pay | Admitting: *Deleted

## 2011-12-11 VITALS — BP 161/79 | HR 70 | Temp 97.1°F | Ht 64.0 in | Wt 161.7 lb

## 2011-12-11 DIAGNOSIS — D72819 Decreased white blood cell count, unspecified: Secondary | ICD-10-CM

## 2011-12-11 DIAGNOSIS — C3490 Malignant neoplasm of unspecified part of unspecified bronchus or lung: Secondary | ICD-10-CM

## 2011-12-11 DIAGNOSIS — C3410 Malignant neoplasm of upper lobe, unspecified bronchus or lung: Secondary | ICD-10-CM

## 2011-12-11 DIAGNOSIS — R634 Abnormal weight loss: Secondary | ICD-10-CM

## 2011-12-11 DIAGNOSIS — R112 Nausea with vomiting, unspecified: Secondary | ICD-10-CM

## 2011-12-11 DIAGNOSIS — C189 Malignant neoplasm of colon, unspecified: Secondary | ICD-10-CM

## 2011-12-11 DIAGNOSIS — I10 Essential (primary) hypertension: Secondary | ICD-10-CM

## 2011-12-11 DIAGNOSIS — C187 Malignant neoplasm of sigmoid colon: Secondary | ICD-10-CM

## 2011-12-11 LAB — CBC WITH DIFFERENTIAL/PLATELET
BASO%: 0.6 % (ref 0.0–2.0)
EOS%: 2.1 % (ref 0.0–7.0)
MCH: 24.9 pg — ABNORMAL LOW (ref 25.1–34.0)
MCHC: 32.4 g/dL (ref 31.5–36.0)
MCV: 76.8 fL — ABNORMAL LOW (ref 79.5–101.0)
MONO%: 4.5 % (ref 0.0–14.0)
NEUT#: 1.7 10*3/uL (ref 1.5–6.5)
RBC: 5.36 10*6/uL (ref 3.70–5.45)
RDW: 17.5 % — ABNORMAL HIGH (ref 11.2–14.5)

## 2011-12-11 LAB — BASIC METABOLIC PANEL
Calcium: 9.3 mg/dL (ref 8.4–10.5)
Potassium: 3.6 mEq/L (ref 3.5–5.3)
Sodium: 141 mEq/L (ref 135–145)

## 2011-12-11 NOTE — Progress Notes (Signed)
Pt c/o burning and reflux during radiation tx today. Spoke w/pt who states "I know what's wrong. I've not been taking the Carafate." Advised pt she needs to take 5-10 min prior to meals; pt on Prilosec as well which she is taking. Pt states she drinks Ensure in morning then has nothing else to eat until after radiation. Advised her to eat something like crackers before coming for tx. Pt verbalized understanding, states that on Fridays reflux seems worse. She states she will be certain to eat before tx, especially on Fridays. Pt knows to inform nurse if she continues to have burning, reflux unrelieved by above measures.

## 2011-12-11 NOTE — Progress Notes (Signed)
[facility]  Telephone:(336) [phone] Fax:(336) [phone]   OFFICE PROGRESS NOTE   Cc:  CHAMBERLAIN,RACHEL, MD  PAST DIAGNOSIS AND PAST THERAPY:  1. 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 Her post resection surveillance colonoscopy onOctober 14, 2009, by Dr. Christella Hartigan was negative. 2. pT1a, pN0 well differentiated adenocarcinoma of lung; s/p left upper lobectomy on 06/16/2010.  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   CURRENT THERAPY: started concurrent chemoradiation on 11/16/2011 with daily XRT and weekly Carboplatin/Taxol.   INTERVAL HISTORY: Lauren [name] 61 y.o. female returns for regular follow up by herself.  She reported feeling relatively well.  She recently was able to stop smoking last week since she started electronic cigarettes.  She has some skin sensitivity with radiation in the back without any opened wound. She tolerates chemo well.  She denied fever, neuropathy, mucositis, infusion reaction, bleeding, skin rash, fatigue, anorexia, weight loss.   Past Medical History  Diagnosis Date  . Depression   . Hyperlipidemia   . Hypertension   . Colon cancer   . Lung cancer     PET scan 04/28/2010; primary: increase in size 02/2010  . Lung nodule     FNA ordered for 04/02/10 by Luwanda Starr>pos Ca  . Chronic folliculitis     of groin  . Fibrocystic breast changes   . Family history of trichomonal vaginitis 05/2005  . Postmenopausal   . Depressive disorder   . Hypercholesterolemia   . GERD (gastroesophageal reflux disease)   . Cerebral aneurysm   . Back pain   . Shortness of breath   . COPD (chronic obstructive pulmonary disease)   . Coronary artery disease   . Arthritis   . Weight loss 10/22/2011    Past Surgical History  Procedure Date  . Colon surgery   .  Tubal ligation   . Lung lobectomy 2012  . Hernia repair   . Breast surgery     Bil lumpectomy  . Back surgery   . Mediastinoscopy 10/19/2011    Procedure: MEDIASTINOSCOPY;  Surgeon: Alleen Borne, MD;  Location: [facility] OR;  Service: Thoracic;  Laterality: N/A;  . Cardiac cath x3     Current Outpatient Prescriptions  Medication Sig Dispense Refill  . albuterol (PROVENTIL HFA;VENTOLIN HFA) 108 (90 BASE) MCG/ACT inhaler Inhale 2 puffs into the lungs every 6 (six) hours as needed.      . Alum & Mag Hydroxide-Simeth (MAGIC MOUTHWASH W/LIDOCAINE) SOLN Take 5 mLs by mouth 3 (three) times daily as needed (15-20 minutes before eating).  480 mL  2  . aspirin 81 MG EC tablet Take 81 mg by mouth daily.      . cholecalciferol (VITAMIN D) 1000 UNITS tablet Take 1,000 Units by mouth daily.      . cloNIDine (CATAPRES) 0.1 MG tablet Take 0.1 mg by mouth at bedtime.      . diclofenac sodium (VOLTAREN) 1 % GEL Apply 1 application topically 4 (four) times daily. As needed for pain      . emollient (BIAFINE) cream Apply topically as needed.      . fish oil-omega-3 fatty acids 1000 MG capsule Take 2,400 g by mouth daily. Also contains FLAX BORAGE      . hydrochlorothiazide (HYDRODIURIL) 25 MG tablet Take 25 mg by mouth daily.      Marland Kitchen  nebivolol (BYSTOLIC) 10 MG tablet Take 10 mg by mouth daily.      . nitroGLYCERIN (NITROSTAT) 0.4 MG SL tablet Place 0.4 mg under the tongue every 5 (five) minutes as needed.      Marland Kitchen omeprazole (PRILOSEC) 20 MG capsule Take 20 mg by mouth daily.        . ondansetron (ZOFRAN) 8 MG tablet Take 1 tab two times a day starting the day after chemo for 3 days. Then take 1 tab two times a day as needed for nausea or vomiting.  30 tablet  1  . pravastatin (PRAVACHOL) 40 MG tablet Take 80 mg by mouth daily.      . prochlorperazine (COMPAZINE) 10 MG tablet Take 1 tablet (10 mg total) by mouth every 6 (six) hours as needed (Nausea or vomiting).  30 tablet  1  . Spirulina POWD by Does not apply route  daily.      . sucralfate (CARAFATE) 1 G tablet Take 1 tablet (1 g total) by mouth 4 (four) times daily. Dissolve 1 tablet in 8 oz water and take po qid  60 tablet  1  . traMADol (ULTRAM) 50 MG tablet Take 50 mg by mouth every 6 (six) hours as needed. As needed for pain.      Marland Kitchen zolpidem (AMBIEN) 5 MG tablet Take 5 mg by mouth at bedtime as needed.      Marland Kitchen DISCONTD: buPROPion (WELLBUTRIN XL) 150 MG 24 hr tablet Take 1 tablet (150 mg total) by mouth 2 (two) times daily.  60 tablet  5    ALLERGIES:  is allergic to lisinopril.  REVIEW OF SYSTEMS:  The rest of the 14-point review of system was negative.   Filed Vitals:   12/11/11 1548  BP: 161/79  Pulse: 70  Temp: 97.1 F (36.2 C)   Wt Readings from Last 3 Encounters:  12/11/11 161 lb 11.2 oz (73.347 kg)  12/08/11 160 lb 12.8 oz (72.938 kg)  12/07/11 160 lb 1.6 oz (72.621 kg)   ECOG Performance status: 0-1  PHYSICAL EXAMINATION:   General: thin-appearing woman, in no acute distress. Eyes: no scleral icterus. ENT: There were no oropharyngeal lesions. Neck was without thyromegaly. Mediastinal biopsy site has healed up completely without erythema, pain, purulent discharge. Lymphatics: Negative cervical, supraclavicular or axillary adenopathy. Respiratory: lungs were clear bilaterally without wheezing or crackles. Cardiovascular: Regular rate and rhythm, S1/S2, without murmur, rub or gallop. There was no pedal edema. GI: abdomen was soft, flat, nontender, nondistended, without organomegaly. Muscoloskeletal: no spinal tenderness of palpation of vertebral spine. Skin exam was without echymosis, petichae. Neuro exam was nonfocal. Patient was able to get on and off exam table without assistance. Gait was normal. Patient was alerted and oriented. Attention was good. Language was appropriate. Mood was normal without depression. Speech was not pressured. Thought content was not tangential.    LABORATORY/RADIOLOGY DATA:  Lab Results  Component Value  Date   WBC 2.2* 12/11/2011   HGB 13.3 12/11/2011   HCT 41.2 12/11/2011   PLT 135* 12/11/2011   GLUCOSE 91 12/07/2011   CHOL 190 12/08/2010   TRIG 104 12/08/2010   HDL 39* 12/08/2010   LDLDIRECT 111* 02/27/2010   LDLCALC 130* 12/08/2010   ALKPHOS 87 12/07/2011   ALT 9 12/07/2011   AST 11 12/07/2011   NA 138 12/07/2011   K 3.7 12/07/2011   CL 105 12/07/2011   CREATININE 0.67 12/07/2011   BUN 13 12/07/2011   CO2 26 12/07/2011   INR  1.10 10/19/2011    ASSESSMENT AND PLAN:    1. Recurrent Lung cancer:  - Doing relatively well with chemo. She has no dose limiting toxicity.  Her ANC is still >1.5.  I recommended to proceed with then next dose of chemo on 12/14/11 without dose reduction.  2. History of colon cancer: She has no evidence of recurrent or metastatic disease for her colon cancer. Surveillance for her history of colon cancer entails once-a-year CT scan for 5 years' total, with the next CT abdomen in November 2013. She had a surveillance colonoscopy in October 2012 by Dr Christella Hartigan, which showed polyps. Per his recommendation, the next colonoscopy is due in Oct 2015 but sooner if she has symptoms.  3. Smoking: I congratulated her on her going off of regular cigarettes.  4. Hypertension: She is on nebivolol, Catapres, HCTZ and clonidine per PCP.  5. Hypercholesterolemia: She is on pravastatin per PCP.  7. Anorexia: Due to new diagnosis of recurrent lung cancer.  Her weight is stable.   8. Mild Calorie/protein malnutrition: Stable.  9. Nausea/vomting: well controlled with Zofran and Compazine.  10.  Leukopenia without neutropenia:  From chemo.  I may consider reducing the dose of chemo after this cycle if she develops neutropenia with ANC <1.5.  11. Follow up: in one week.     The length of time of the face-to-face encounter was 15 minutes. More than 50% of time was spent counseling and coordination of care.

## 2011-12-14 ENCOUNTER — Ambulatory Visit
Admission: RE | Admit: 2011-12-14 | Discharge: 2011-12-14 | Disposition: A | Discharge status: Home / Self Care | Payer: Medicare Other | Source: Ambulatory Visit | Attending: Radiation Oncology | Admitting: Radiation Oncology

## 2011-12-14 ENCOUNTER — Ambulatory Visit (HOSPITAL_BASED_OUTPATIENT_CLINIC_OR_DEPARTMENT_OTHER): Payer: Medicare Other

## 2011-12-14 ENCOUNTER — Encounter: Payer: Medicare Other | Admitting: Nutrition

## 2011-12-14 VITALS — BP 148/84 | HR 71 | Temp 97.9°F

## 2011-12-14 DIAGNOSIS — Z5111 Encounter for antineoplastic chemotherapy: Secondary | ICD-10-CM

## 2011-12-14 DIAGNOSIS — C187 Malignant neoplasm of sigmoid colon: Secondary | ICD-10-CM

## 2011-12-14 DIAGNOSIS — C3490 Malignant neoplasm of unspecified part of unspecified bronchus or lung: Secondary | ICD-10-CM

## 2011-12-14 DIAGNOSIS — C3410 Malignant neoplasm of upper lobe, unspecified bronchus or lung: Secondary | ICD-10-CM

## 2011-12-14 MED ORDER — FAMOTIDINE IN NACL 20-0.9 MG/50ML-% IV SOLN
20.0000 mg | Freq: Once | INTRAVENOUS | Status: AC
  Administered 2011-12-14: 20 mg via INTRAVENOUS

## 2011-12-14 MED ORDER — DEXAMETHASONE SODIUM PHOSPHATE 4 MG/ML IJ SOLN
20.0000 mg | Freq: Once | INTRAMUSCULAR | Status: AC
  Administered 2011-12-14: 20 mg via INTRAVENOUS

## 2011-12-14 MED ORDER — PACLITAXEL CHEMO INJECTION 300 MG/50ML
45.0000 mg/m2 | Freq: Once | INTRAVENOUS | Status: AC
  Administered 2011-12-14: 84 mg via INTRAVENOUS
  Filled 2011-12-14: qty 14

## 2011-12-14 MED ORDER — SODIUM CHLORIDE 0.9 % IV SOLN
Freq: Once | INTRAVENOUS | Status: AC
  Administered 2011-12-14: 14:00:00 via INTRAVENOUS

## 2011-12-14 MED ORDER — HEPARIN SOD (PORK) LOCK FLUSH 100 UNIT/ML IV SOLN
500.0000 [IU] | Freq: Once | INTRAVENOUS | Status: DC | PRN
  Filled 2011-12-14: qty 5

## 2011-12-14 MED ORDER — SODIUM CHLORIDE 0.9 % IV SOLN
243.6000 mg | Freq: Once | INTRAVENOUS | Status: AC
  Administered 2011-12-14: 240 mg via INTRAVENOUS
  Filled 2011-12-14: qty 24

## 2011-12-14 MED ORDER — DIPHENHYDRAMINE HCL 50 MG/ML IJ SOLN
50.0000 mg | Freq: Once | INTRAMUSCULAR | Status: AC
  Administered 2011-12-14: 50 mg via INTRAVENOUS

## 2011-12-14 MED ORDER — ONDANSETRON 16 MG/50ML IVPB (CHCC)
16.0000 mg | Freq: Once | INTRAVENOUS | Status: AC
  Administered 2011-12-14: 16 mg via INTRAVENOUS

## 2011-12-14 MED ORDER — SODIUM CHLORIDE 0.9 % IJ SOLN
10.0000 mL | INTRAMUSCULAR | Status: DC | PRN
  Filled 2011-12-14: qty 10

## 2011-12-14 NOTE — Progress Notes (Signed)
Verified with Dr. Gaylyn Rong. pt. developed several red spots of hives to lt. upper arm post infusion today.  IV site clean and intact without any signs of  infitrate or extravasation. Per Dr. Gaylyn Rong, pt. can use benadryl cream/onitment and apply over areas of hives.  Pt. had 50mg  of benadryl IV today pre treatment.  Instructed pt. to take another dose of 25mg  benadryl before bedtime and use as needed tomorrow.  Pt. Instructed to go to the nearest emergency facility if symptoms worsen over night.   Written directions given to pt re:use of benadryl.  HL

## 2011-12-14 NOTE — Patient Instructions (Signed)
[facility] Discharge Instructions for Patients Receiving Chemotherapy  Today you received the following chemotherapy agents Taxol and Carboplatin.  To help prevent nausea and vomiting after your treatment, we encourage you to take your nausea medication as prescribed.   If you develop nausea and vomiting that is not controlled by your nausea medication, call the clinic. If it is after clinic hours your family physician or the after hours number for the clinic or go to the Emergency Department.   BELOW ARE SYMPTOMS THAT SHOULD BE REPORTED IMMEDIATELY:  *FEVER GREATER THAN 100.5 F  *CHILLS WITH OR WITHOUT FEVER  NAUSEA AND VOMITING THAT IS NOT CONTROLLED WITH YOUR NAUSEA MEDICATION  *UNUSUAL SHORTNESS OF BREATH  *UNUSUAL BRUISING OR BLEEDING  TENDERNESS IN MOUTH AND THROAT WITH OR WITHOUT PRESENCE OF ULCERS  *URINARY PROBLEMS  *BOWEL PROBLEMS  UNUSUAL RASH Items with * indicate a potential emergency and should be followed up as soon as possible.  One of the nurses will contact you 24 hours after your treatment. Please let the nurse know about any problems that you may have experienced. Feel free to call the clinic you have any questions or concerns. The clinic phone number is [phone].   I have been informed and understand all the instructions given to me. I know to contact the clinic, my physician, or go to the Emergency Department if any problems should occur. I do not have any questions at this time, but understand that I may call the clinic during office hours   should I have any questions or need assistance in obtaining follow up care.    __________________________________________  _____________  __________ Signature of Patient or Authorized Representative            Date                   Time    __________________________________________ Nurse's Signature    

## 2011-12-14 NOTE — Progress Notes (Signed)
1640-Pt returned from restroom after IV d/c'd.  Pt has several small hives proximal to IV site.  She denies any itching, ShOB or any other symptoms.  At IV d/c, she had good blood return from IV.  Will contact Dr. Gaylyn Rong for instructions-dhp, rn

## 2011-12-15 ENCOUNTER — Encounter: Payer: Self-pay | Admitting: Radiation Oncology

## 2011-12-15 ENCOUNTER — Ambulatory Visit
Admission: RE | Admit: 2011-12-15 | Discharge: 2011-12-15 | Disposition: A | Discharge status: Home / Self Care | Payer: Medicare Other | Source: Ambulatory Visit | Attending: Radiation Oncology | Admitting: Radiation Oncology

## 2011-12-15 VITALS — BP 121/80 | HR 77 | Resp 18 | Wt 159.3 lb

## 2011-12-15 DIAGNOSIS — Z85118 Personal history of other malignant neoplasm of bronchus and lung: Secondary | ICD-10-CM

## 2011-12-15 NOTE — Progress Notes (Signed)
Weekly Management Note Current Dose:42.0 Gy  Projected Dose:60.0 Gy   Narrative:  The patient presents for routine under treatment assessment.  CBCT/MVCT images/Port film x-rays were reviewed.  The chart was checked.  She is tolerating her treatments well this week. The patient has had some mild itching in the treatment area. She denies any swallowing problems this week. She denies any significant cough. Patient does have some fatigue at this time.  Physical Findings:  The lungs are clear. The heart has a regular rhythm and rate. The patient's skin in the treatment area shows no significant reaction at this point.  Vitals:  Filed Vitals:   12/15/11 1432  BP: 121/80  Pulse: 77  Resp: 18   Weight:  Wt Readings from Last 3 Encounters:  12/15/11 159 lb 4.8 oz (72.258 kg)  12/11/11 161 lb 11.2 oz (73.347 kg)  12/08/11 160 lb 12.8 oz (72.938 kg)   Lab Results  Component Value Date   WBC 2.2* 12/11/2011   HGB 13.3 12/11/2011   HCT 41.2 12/11/2011   MCV 76.8* 12/11/2011   PLT 135* 12/11/2011   Lab Results  Component Value Date   CREATININE 0.66 12/11/2011   BUN 10 12/11/2011   NA 141 12/11/2011   K 3.6 12/11/2011   CL 103 12/11/2011   CO2 30 12/11/2011     Impression:  The patient is tolerating radiation.  Plan:  Continue treatment as planned.  -----------------------------------  Billie Lade, PhD, MD

## 2011-12-15 NOTE — Progress Notes (Signed)
Patient presents to the clinic today unaccompanied for an under treat visit with Dr. Roselind Messier. Patient is alert and oriented to person, place, and time. No distress noted. Steady gait noted. Pleasant affect noted. Patient denies pain at this time. Patient reports only an occasional productive cough with only thin clear sputum. Patient reports shortness of breath with exertion. Patient reports using Biafine on her treated skin as directed. Only faint hyperpigmentation without desquamation noted. Esophagitis has improved greatly. Bilateral upper extremity rash related to effects of chemo but, patient using topical benadryl.  Reported all findings to Dr. Roselind Messier.

## 2011-12-16 ENCOUNTER — Ambulatory Visit
Admission: RE | Admit: 2011-12-16 | Discharge: 2011-12-16 | Disposition: A | Discharge status: Home / Self Care | Payer: Medicare Other | Source: Ambulatory Visit | Attending: Radiation Oncology | Admitting: Radiation Oncology

## 2011-12-17 ENCOUNTER — Ambulatory Visit
Admission: RE | Admit: 2011-12-17 | Discharge: 2011-12-17 | Disposition: A | Discharge status: Home / Self Care | Payer: Medicare Other | Source: Ambulatory Visit | Attending: Radiation Oncology | Admitting: Radiation Oncology

## 2011-12-18 ENCOUNTER — Ambulatory Visit
Admission: RE | Admit: 2011-12-18 | Discharge: 2011-12-18 | Disposition: A | Discharge status: Home / Self Care | Payer: Medicare Other | Source: Ambulatory Visit | Attending: Radiation Oncology | Admitting: Radiation Oncology

## 2011-12-18 ENCOUNTER — Other Ambulatory Visit: Payer: Self-pay | Admitting: Oncology

## 2011-12-18 DIAGNOSIS — C3490 Malignant neoplasm of unspecified part of unspecified bronchus or lung: Secondary | ICD-10-CM

## 2011-12-21 ENCOUNTER — Other Ambulatory Visit (HOSPITAL_BASED_OUTPATIENT_CLINIC_OR_DEPARTMENT_OTHER): Payer: Medicare Other | Admitting: Lab

## 2011-12-21 ENCOUNTER — Ambulatory Visit
Admission: RE | Admit: 2011-12-21 | Discharge: 2011-12-21 | Disposition: A | Discharge status: Home / Self Care | Payer: Medicare Other | Source: Ambulatory Visit | Attending: Radiation Oncology | Admitting: Radiation Oncology

## 2011-12-21 ENCOUNTER — Ambulatory Visit (HOSPITAL_BASED_OUTPATIENT_CLINIC_OR_DEPARTMENT_OTHER): Payer: Medicare Other | Admitting: Oncology

## 2011-12-21 ENCOUNTER — Encounter: Payer: Self-pay | Admitting: Oncology

## 2011-12-21 ENCOUNTER — Ambulatory Visit: Payer: Medicare Other | Admitting: Lab

## 2011-12-21 ENCOUNTER — Ambulatory Visit: Payer: Medicare Other | Admitting: Nutrition

## 2011-12-21 ENCOUNTER — Ambulatory Visit: Payer: Medicare Other

## 2011-12-21 VITALS — BP 165/80 | HR 81 | Temp 96.9°F | Resp 18 | Ht 64.0 in | Wt 163.8 lb

## 2011-12-21 DIAGNOSIS — C3490 Malignant neoplasm of unspecified part of unspecified bronchus or lung: Secondary | ICD-10-CM

## 2011-12-21 DIAGNOSIS — R3989 Other symptoms and signs involving the genitourinary system: Secondary | ICD-10-CM

## 2011-12-21 DIAGNOSIS — R300 Dysuria: Secondary | ICD-10-CM

## 2011-12-21 DIAGNOSIS — C187 Malignant neoplasm of sigmoid colon: Secondary | ICD-10-CM

## 2011-12-21 DIAGNOSIS — D72819 Decreased white blood cell count, unspecified: Secondary | ICD-10-CM

## 2011-12-21 DIAGNOSIS — C3410 Malignant neoplasm of upper lobe, unspecified bronchus or lung: Secondary | ICD-10-CM

## 2011-12-21 LAB — CBC WITH DIFFERENTIAL/PLATELET
Basophils Absolute: 0 10*3/uL (ref 0.0–0.1)
Eosinophils Absolute: 0 10*3/uL (ref 0.0–0.5)
HCT: 37.3 % (ref 34.8–46.6)
HGB: 12.5 g/dL (ref 11.6–15.9)
MONO#: 0.2 10*3/uL (ref 0.1–0.9)
NEUT#: 1 10*3/uL — ABNORMAL LOW (ref 1.5–6.5)
RDW: 17.9 % — ABNORMAL HIGH (ref 11.2–14.5)
lymph#: 0.3 10*3/uL — ABNORMAL LOW (ref 0.9–3.3)

## 2011-12-21 LAB — URINALYSIS, MICROSCOPIC - CHCC
Bilirubin (Urine): NEGATIVE
Blood: NEGATIVE
Glucose: NEGATIVE g/dL
Ketones: NEGATIVE mg/dL
Leukocyte Esterase: NEGATIVE
Specific Gravity, Urine: 1.025 (ref 1.003–1.035)

## 2011-12-21 NOTE — Progress Notes (Signed)
[facility]  Telephone:(336) [phone] Fax:(336) [phone]   OFFICE PROGRESS NOTE   Cc:  CHAMBERLAIN,RACHEL, MD  PAST DIAGNOSIS AND PAST THERAPY:  1. 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 Her post resection surveillance colonoscopy onOctober 14, 2009, by Dr. Christella Hartigan was negative. 2. pT1a, pN0 well differentiated adenocarcinoma of lung; s/p left upper lobectomy on 06/16/2010.  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   CURRENT THERAPY: started concurrent chemoradiation on 11/16/2011 with daily XRT and weekly Carboplatin/Taxol.   INTERVAL HISTORY: KEYANAH KOZICKI 61 y.o. female returns for regular follow up with her friend.  She reported feeling relatively well.  She recently was able to stop smoking last week since she started electronic cigarettes.  She has some skin sensitivity with radiation in the back without any opened wound. States she has been itching. She is using Benadryl cream with improvement. She tolerates chemo well.  She denied fever, neuropathy, mucositis, infusion reaction, bleeding, fatigue, anorexia, weight loss. Reports increase odor and cloudiness to her urine. No hematuria or dysuria.   Past Medical History  Diagnosis Date  . Depression   . Hyperlipidemia   . Hypertension   . Colon cancer   . Lung cancer     PET scan 04/28/2010; primary: increase in size 02/2010  . Lung nodule     FNA ordered for 04/02/10 by HA>pos Ca  . Chronic folliculitis     of groin  . Fibrocystic breast changes   . Family history of trichomonal vaginitis 05/2005  . Postmenopausal   . Depressive disorder   . Hypercholesterolemia   . GERD (gastroesophageal reflux disease)   . Cerebral aneurysm   . Back pain   . Shortness of breath   . COPD (chronic obstructive pulmonary  disease)   . Coronary artery disease   . Arthritis   . Weight loss 10/22/2011    Past Surgical History  Procedure Date  . Colon surgery   . Tubal ligation   . Lung lobectomy 2012  . Hernia repair   . Breast surgery     Bil lumpectomy  . Back surgery   . Mediastinoscopy 10/19/2011    Procedure: MEDIASTINOSCOPY;  Surgeon: Alleen Borne, MD;  Location: [facility] OR;  Service: Thoracic;  Laterality: N/A;  . Cardiac cath x3     Current Outpatient Prescriptions  Medication Sig Dispense Refill  . albuterol (PROVENTIL HFA;VENTOLIN HFA) 108 (90 BASE) MCG/ACT inhaler Inhale 2 puffs into the lungs every 6 (six) hours as needed.      . Alum & Mag Hydroxide-Simeth (MAGIC MOUTHWASH W/LIDOCAINE) SOLN Take 5 mLs by mouth 3 (three) times daily as needed (15-20 minutes before eating).  480 mL  2  . aspirin 81 MG EC tablet Take 81 mg by mouth daily.      . cholecalciferol (VITAMIN D) 1000 UNITS tablet Take 1,000 Units by mouth daily.      . cloNIDine (CATAPRES) 0.1 MG tablet Take 0.1 mg by mouth at bedtime.      . diclofenac sodium (VOLTAREN) 1 % GEL Apply 1 application topically 4 (four) times daily. As needed for pain      . emollient (BIAFINE) cream Apply topically as needed.      . fish oil-omega-3 fatty acids 1000 MG capsule Take 2,400 g by mouth daily. Also contains FLAX BORAGE      .  hydrochlorothiazide (HYDRODIURIL) 25 MG tablet Take 25 mg by mouth daily.      . nebivolol (BYSTOLIC) 10 MG tablet Take 10 mg by mouth daily.      . nitroGLYCERIN (NITROSTAT) 0.4 MG SL tablet Place 0.4 mg under the tongue every 5 (five) minutes as needed.      Marland Kitchen omeprazole (PRILOSEC) 20 MG capsule Take 20 mg by mouth daily.        . ondansetron (ZOFRAN) 8 MG tablet Take 1 tab two times a day starting the day after chemo for 3 days. Then take 1 tab two times a day as needed for nausea or vomiting.  30 tablet  1  . pravastatin (PRAVACHOL) 40 MG tablet Take 80 mg by mouth daily.      . prochlorperazine (COMPAZINE) 10 MG  tablet Take 1 tablet (10 mg total) by mouth every 6 (six) hours as needed (Nausea or vomiting).  30 tablet  1  . Spirulina POWD by Does not apply route daily.      . sucralfate (CARAFATE) 1 G tablet Take 1 tablet (1 g total) by mouth 4 (four) times daily. Dissolve 1 tablet in 8 oz water and take po qid  60 tablet  1  . traMADol (ULTRAM) 50 MG tablet Take 50 mg by mouth every 6 (six) hours as needed. As needed for pain.      Marland Kitchen zolpidem (AMBIEN) 5 MG tablet Take 5 mg by mouth at bedtime as needed.      Marland Kitchen DISCONTD: buPROPion (WELLBUTRIN XL) 150 MG 24 hr tablet Take 1 tablet (150 mg total) by mouth 2 (two) times daily.  60 tablet  5    ALLERGIES:  is allergic to lisinopril.  REVIEW OF SYSTEMS:  The rest of the 14-point review of system was negative.   Filed Vitals:   12/21/11 1116  BP: 165/80  Pulse: 81  Temp: 96.9 F (36.1 C)  Resp: 18   Wt Readings from Last 3 Encounters:  12/21/11 163 lb 12.8 oz (74.299 kg)  12/15/11 159 lb 4.8 oz (72.258 kg)  12/11/11 161 lb 11.2 oz (73.347 kg)   ECOG Performance status: 0-1  PHYSICAL EXAMINATION:   General: thin-appearing woman, in no acute distress. Eyes: no scleral icterus. ENT: There were no oropharyngeal lesions. Neck was without thyromegaly. Mediastinal biopsy site has healed up completely without erythema, pain, purulent discharge. Lymphatics: Negative cervical, supraclavicular or axillary adenopathy. Respiratory: lungs were clear bilaterally without wheezing or crackles. Cardiovascular: Regular rate and rhythm, S1/S2, without murmur, rub or gallop. There was no pedal edema. GI: abdomen was soft, flat, nontender, nondistended, without organomegaly. Muscoloskeletal: no spinal tenderness of palpation of vertebral spine. Skin exam was without echymosis, petichae. Neuro exam was nonfocal. Patient was able to get on and off exam table without assistance. Gait was normal. Patient was alerted and oriented. Attention was good. Language was appropriate.  Mood was normal without depression. Speech was not pressured. Thought content was not tangential.    LABORATORY/RADIOLOGY DATA:  Lab Results  Component Value Date   WBC 1.4* 12/21/2011   HGB 12.5 12/21/2011   HCT 37.3 12/21/2011   PLT 77* 12/21/2011   GLUCOSE 93 12/11/2011   CHOL 190 12/08/2010   TRIG 104 12/08/2010   HDL 39* 12/08/2010   LDLDIRECT 111* 02/27/2010   LDLCALC 130* 12/08/2010   ALKPHOS 87 12/07/2011   ALT 9 12/07/2011   AST 11 12/07/2011   NA 141 12/11/2011   K 3.6 12/11/2011   CL  103 12/11/2011   CREATININE 0.66 12/11/2011   BUN 10 12/11/2011   CO2 30 12/11/2011   INR 1.10 10/19/2011    ASSESSMENT AND PLAN:    1. Recurrent Lung cancer: She has been tolerating chemotherapy well overall. She has now developed grade 2 neutropenia and grade 1 thrombocytopenia. Will hold her chemotherapy today. Plan to reduce future chemotherapy doses by 25%. 2. History of colon cancer: She has no evidence of recurrent or metastatic disease for her colon cancer. Surveillance for her history of colon cancer entails once-a-year CT scan for 5 years' total, with the next CT abdomen in November 2013. She had a surveillance colonoscopy in October 2012 by Dr Christella Hartigan, which showed polyps. Per his recommendation, the next colonoscopy is due in Oct 2015 but sooner if she has symptoms.  3. Smoking: I congratulated her on her going off of regular cigarettes.  4. Hypertension: She is on nebivolol, Catapres, HCTZ and clonidine per PCP.  5. Hypercholesterolemia: She is on pravastatin per PCP.  7. Anorexia: Due to new diagnosis of recurrent lung cancer.  Her weight is stable.   8. Mild Calorie/protein malnutrition: Stable.  9. Nausea/vomting: well controlled with Zofran and Compazine.  10.  Leukopenia without neutropenia:  From chemo.  Plan to reduce chemotherapy by 25% in the future.  11. Urinary symptoms: Will check a urinalysis and urine culture today. 12. Follow up: in one week.     The length of time of the  face-to-face encounter was 15 minutes. More than 50% of time was spent counseling and coordination of care.

## 2011-12-21 NOTE — Progress Notes (Signed)
Lauren [name] unable to receive chemotherapy today due to lowered WBC of 1.4 with an ANC of 1.0.  After review of the labs, Dr. Basilio Cairo approved Radiation Therapy treatment today. Lauren [name], RTT notified.

## 2011-12-21 NOTE — Progress Notes (Signed)
Lauren [name] reports that she continues to eat the best she can.  Her weight is overall stable and was documented as 163.8 pounds today, August 5th, which has increased slightly.  However, it remains within her usual body weight of 160 to 165 pounds.  Patient reports some taste alterations along with food odors and other smells increasing her distaste for food.  She does continue to try to eat often in small amounts.  She denies other nutritional side affects.  NUTRITION DIAGNOSIS:  Food and nutrition related knowledge deficit has improved.  INTERVENTION:  I have encouraged Lauren [name] to continue to try to choose a healthy plant-based diet with adequate protein in small amounts throughout the day to promote weight maintenance.  I have answered her questions today.  I provided her with some information on incorporating whole grains in her diet.  MONITORING/EVALUATION/GOALS:  The patient is tolerating oral diet and has achieved weight stabilization.  NEXT VISIT:  Monday August 12, during chemotherapy.   ______________________________ Zenovia Jarred, RD, CSO, LDN Clinical Nutrition Specialist BN/MEDQ  D:  12/21/2011  T:  12/21/2011  Job:  [id number]

## 2011-12-22 ENCOUNTER — Ambulatory Visit
Admission: RE | Admit: 2011-12-22 | Discharge: 2011-12-22 | Disposition: A | Discharge status: Home / Self Care | Payer: Medicare Other | Source: Ambulatory Visit | Attending: Radiation Oncology | Admitting: Radiation Oncology

## 2011-12-23 ENCOUNTER — Ambulatory Visit: Payer: Medicare Other

## 2011-12-23 ENCOUNTER — Telehealth: Payer: Self-pay | Admitting: *Deleted

## 2011-12-23 LAB — URINE CULTURE

## 2011-12-23 NOTE — Telephone Encounter (Signed)
Message copied by Wende Mott on Wed Dec 23, 2011  3:49 PM ------      Message from: Clenton Pare R      Created: Wed Dec 23, 2011 12:28 PM       Cameo            Please call patient and let her know that urine culture was negative (No UTI). Thanks

## 2011-12-23 NOTE — Telephone Encounter (Signed)
Notified pt of urine culture negative.  She verbalized understanding.

## 2011-12-24 ENCOUNTER — Ambulatory Visit
Admission: RE | Admit: 2011-12-24 | Discharge: 2011-12-24 | Disposition: A | Discharge status: Home / Self Care | Payer: Medicare Other | Source: Ambulatory Visit | Attending: Radiation Oncology | Admitting: Radiation Oncology

## 2011-12-24 DIAGNOSIS — C3490 Malignant neoplasm of unspecified part of unspecified bronchus or lung: Secondary | ICD-10-CM

## 2011-12-24 NOTE — Progress Notes (Signed)
Weekly Management Note Current Dose:54 Gy  Projected Dose:60 Gy   Narrative:  The patient presents for routine under treatment assessment.  CBCT/MVCT images/Port film x-rays were reviewed.  The chart was checked. Doing well.  MMW controlling dysphagia. Chemo held for white counts.   Physical Findings:  Alert, pleasant. No changes  Weight:  Wt Readings from Last 3 Encounters:  12/21/11 163 lb 12.8 oz (74.299 kg)  12/15/11 159 lb 4.8 oz (72.258 kg)  12/11/11 161 lb 11.2 oz (73.347 kg)   Lab Results  Component Value Date   WBC 1.4* 12/21/2011   HGB 12.5 12/21/2011   HCT 37.3 12/21/2011   MCV 74.9* 12/21/2011   PLT 77* 12/21/2011   Lab Results  Component Value Date   CREATININE 0.66 12/11/2011   BUN 10 12/11/2011   NA 141 12/11/2011   K 3.6 12/11/2011   CL 103 12/11/2011   CO2 30 12/11/2011     Impression:  The patient is tolerating radiation.  Plan:  Continue treatment as planned. Continue RT.  No need for chemo next week as she will only be getting 2 fractions.

## 2011-12-25 ENCOUNTER — Ambulatory Visit
Admission: RE | Admit: 2011-12-25 | Discharge: 2011-12-25 | Disposition: A | Discharge status: Home / Self Care | Payer: Medicare Other | Source: Ambulatory Visit | Attending: Radiation Oncology | Admitting: Radiation Oncology

## 2011-12-25 ENCOUNTER — Other Ambulatory Visit: Payer: Self-pay | Admitting: Oncology

## 2011-12-28 ENCOUNTER — Encounter: Payer: Self-pay | Admitting: Oncology

## 2011-12-28 ENCOUNTER — Other Ambulatory Visit (HOSPITAL_BASED_OUTPATIENT_CLINIC_OR_DEPARTMENT_OTHER): Payer: Medicare Other | Admitting: Lab

## 2011-12-28 ENCOUNTER — Ambulatory Visit: Payer: Medicare Other | Admitting: Nutrition

## 2011-12-28 ENCOUNTER — Telehealth: Payer: Self-pay | Admitting: Oncology

## 2011-12-28 ENCOUNTER — Ambulatory Visit
Admission: RE | Admit: 2011-12-28 | Discharge: 2011-12-28 | Disposition: A | Discharge status: Home / Self Care | Payer: Medicare Other | Source: Ambulatory Visit | Attending: Radiation Oncology | Admitting: Radiation Oncology

## 2011-12-28 ENCOUNTER — Ambulatory Visit (HOSPITAL_BASED_OUTPATIENT_CLINIC_OR_DEPARTMENT_OTHER): Payer: Medicare Other | Admitting: Oncology

## 2011-12-28 ENCOUNTER — Ambulatory Visit: Payer: 59

## 2011-12-28 ENCOUNTER — Telehealth: Payer: Self-pay | Admitting: Radiation Oncology

## 2011-12-28 VITALS — BP 114/71 | HR 81 | Temp 97.1°F | Resp 20 | Ht 64.0 in | Wt 163.5 lb

## 2011-12-28 DIAGNOSIS — R300 Dysuria: Secondary | ICD-10-CM

## 2011-12-28 DIAGNOSIS — C3490 Malignant neoplasm of unspecified part of unspecified bronchus or lung: Secondary | ICD-10-CM

## 2011-12-28 DIAGNOSIS — C189 Malignant neoplasm of colon, unspecified: Secondary | ICD-10-CM

## 2011-12-28 DIAGNOSIS — D72819 Decreased white blood cell count, unspecified: Secondary | ICD-10-CM

## 2011-12-28 DIAGNOSIS — C3410 Malignant neoplasm of upper lobe, unspecified bronchus or lung: Secondary | ICD-10-CM

## 2011-12-28 DIAGNOSIS — C187 Malignant neoplasm of sigmoid colon: Secondary | ICD-10-CM

## 2011-12-28 LAB — CBC WITH DIFFERENTIAL/PLATELET
Eosinophils Absolute: 0 10*3/uL (ref 0.0–0.5)
MCV: 75.4 fL — ABNORMAL LOW (ref 79.5–101.0)
MONO%: 14.4 % — ABNORMAL HIGH (ref 0.0–14.0)
NEUT#: 1 10*3/uL — ABNORMAL LOW (ref 1.5–6.5)
RBC: 4.91 10*6/uL (ref 3.70–5.45)
RDW: 18.6 % — ABNORMAL HIGH (ref 11.2–14.5)
WBC: 1.7 10*3/uL — ABNORMAL LOW (ref 3.9–10.3)
nRBC: 0 % (ref 0–0)

## 2011-12-28 NOTE — Progress Notes (Signed)
I spoke with Ms. Ribaudo today briefly.  She reports that she continues to eat well.  Her weight is stable at 163.5 pounds.  She states that she was unable to receive her chemotherapy today and is not planning on any more chemo.  She denies nutritional issues at this time.  NUTRITION DIAGNOSIS:  Food and nutrition related knowledge deficit has resolved.  INTERVENTION:  I have encouraged Ms. Mayr to continue oral intake as tolerated to promote weight maintenance for healing.  I have encouraged her to call me if she has any questions or concerns.  The patient is agreeable.   ______________________________ Zenovia Jarred, RD, CSO, LDN Clinical Nutrition Specialist BN/MEDQ  D:  12/28/2011  T:  12/28/2011  Job:  [id number]

## 2011-12-28 NOTE — Patient Instructions (Addendum)
1.  Locally advanced lung cancer:  Concurrent chemoradiation.  I need to cancel further chemo since neutrophil is still low.  Chemo therapy now will result in high risk of infection.  2.  Follow up:  Lab and Kristin in about 3 wks.  CT chest/abdomen in about 6 weeks and seeing me the day after CT.

## 2011-12-28 NOTE — Telephone Encounter (Signed)
Gave pt appt for September 2013 lab and MD, then lab and Ct, see MD on 02/08/12. Pt will drink water based contrasr @ Radiology [facility] after lab draw on 02/05/12

## 2011-12-28 NOTE — Treatment Plan (Signed)
[facility] END OF TREATMENT   Name: Lauren [name] Date: 12/28/2011 MRN: [id number] DOB: 08/13/1950   TREATMENT DATES:  Between 11/16/2011 and 12/14/2011.    REFERRING PHYSICIAN: none.    DIAGNOSIS: adenocarcinoma of the lung   STAGE AT START OF TREATMENT: stage III (could not rule out stage IV).    INTENT:Palliative   DRUGS OR REGIMENS GIVEN:  Concurrent chemoradiation with weekly carboplatin/Taxol    MAJOR TOXICITIES:  Grade 3 neutropenia; grade 1 fatigue.    REASON TREATMENT STOPPED:  Neutropenia.    PERFORMANCE STATUS AT END: ECOG 1   ONGOING PROBLEMS:  Neutropenia.    FOLLOW UP PLANS:  In about 3 weeks to recheck CBC.  In about 6 weeks with restage CT.

## 2011-12-28 NOTE — Telephone Encounter (Signed)
Indigent Approved 100% Family Size: 1 HH INC:14,496.00 MOD POV: 14,362.50 - 15,224.25 Valid Dates: 12/26/2011 - 06/27/2012

## 2011-12-28 NOTE — Progress Notes (Signed)
[facility]  [phone] [phone]   OFFICE PROGRESS NOTE   Cc:  CHAMBERLAIN,RACHEL, MD  PAST DIAGNOSIS AND PAST THERAPY:  1. 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 Her post resection surveillance colonoscopy onOctober 14, 2009, by Dr. Christella Hartigan was negative. 2. pT1a, pN0 well differentiated adenocarcinoma of lung; s/p left upper lobectomy on 06/16/2010.  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   CURRENT THERAPY: started concurrent chemoradiation on 11/16/2011 with daily XRT and weekly Carboplatin/Taxol.   INTERVAL HISTORY: Lauren [name] 61 y.o. female returns for regular follow up with her daughter.  She reported feeling relatively well. She has some pruritic rash on her thighs and arms.  She has used benadryl with some relief.  She has mild nonproductive cough.  She denied fever, SOB, PND, orthopnea, pedal edema.  She only smokes electronic cigarettes now.  She has mild fatigue; however, she is independent of all activities of daily living.   Patient denies headache, visual changes, confusion, drenching night sweats, palpable lymph node swelling, mucositis, odynophagia, dysphagia, nausea vomiting, jaundice, chest pain, palpitation, gum bleeding, epistaxis, hematemesis, hemoptysis, abdominal pain, abdominal swelling, early satiety, melena, hematochezia, hematuria, skin rash, spontaneous bleeding, joint swelling, joint pain, heat or cold intolerance, bowel bladder incontinence, back pain, focal motor weakness, paresthesia, depression, suicidal or homicidal ideation, feeling hopelessness.   Past Medical History  Diagnosis Date  . Depression   . Hyperlipidemia   . Hypertension   . Colon cancer   . Lung cancer     PET scan 04/28/2010; primary: increase in  size 02/2010  . Lung nodule     FNA ordered for 04/02/10 by Gretna Bergin>pos Ca  . Chronic folliculitis     of groin  . Fibrocystic breast changes   . Family history of trichomonal vaginitis 05/2005  . Postmenopausal   . Depressive disorder   . Hypercholesterolemia   . GERD (gastroesophageal reflux disease)   . Cerebral aneurysm   . Back pain   . Shortness of breath   . COPD (chronic obstructive pulmonary disease)   . Coronary artery disease   . Arthritis   . Weight loss 10/22/2011    Past Surgical History  Procedure Date  . Colon surgery   . Tubal ligation   . Lung lobectomy 2012  . Hernia repair   . Breast surgery     Bil lumpectomy  . Back surgery   . Mediastinoscopy 10/19/2011    Procedure: MEDIASTINOSCOPY;  Surgeon: Alleen Borne, MD;  Location: [facility] OR;  Service: Thoracic;  Laterality: N/A;  . Cardiac cath x3     Current Outpatient Prescriptions  Medication Sig Dispense Refill  . albuterol (PROVENTIL HFA;VENTOLIN HFA) 108 (90 BASE) MCG/ACT inhaler Inhale 2 puffs into the lungs every 6 (six) hours as needed.      . Alum & Mag Hydroxide-Simeth (MAGIC MOUTHWASH W/LIDOCAINE) SOLN Take 5 mLs by mouth 3 (three) times daily as needed (15-20 minutes before eating).  480 mL  2  . aspirin 81 MG EC tablet Take 81 mg by mouth daily.      . cholecalciferol (VITAMIN D) 1000 UNITS tablet Take 1,000 Units by mouth daily.      . cloNIDine (CATAPRES) 0.1 MG tablet Take 0.1 mg by mouth at bedtime.      . diclofenac sodium (VOLTAREN) 1 %  GEL Apply 1 application topically 4 (four) times daily. As needed for pain      . emollient (BIAFINE) cream Apply topically as needed.      . fish oil-omega-3 fatty acids 1000 MG capsule Take 2,400 g by mouth daily. Also contains FLAX BORAGE      . hydrochlorothiazide (HYDRODIURIL) 25 MG tablet Take 25 mg by mouth daily.      . hydrocortisone cream 0.5 % 2 (two) times daily.      . nebivolol (BYSTOLIC) 10 MG tablet Take 10 mg by mouth daily.      . nitroGLYCERIN  (NITROSTAT) 0.4 MG SL tablet Place 0.4 mg under the tongue every 5 (five) minutes as needed.      Marland Kitchen omeprazole (PRILOSEC) 20 MG capsule Take 20 mg by mouth daily.        . ondansetron (ZOFRAN) 8 MG tablet Take 1 tab two times a day starting the day after chemo for 3 days. Then take 1 tab two times a day as needed for nausea or vomiting.  30 tablet  1  . pravastatin (PRAVACHOL) 40 MG tablet Take 80 mg by mouth daily.      . prochlorperazine (COMPAZINE) 10 MG tablet Take 1 tablet (10 mg total) by mouth every 6 (six) hours as needed (Nausea or vomiting).  30 tablet  1  . Spirulina POWD by Does not apply route daily.      . traMADol (ULTRAM) 50 MG tablet Take 50 mg by mouth every 6 (six) hours as needed. As needed for pain.      Marland Kitchen zolpidem (AMBIEN) 5 MG tablet Take 5 mg by mouth at bedtime as needed.      Marland Kitchen DISCONTD: buPROPion (WELLBUTRIN XL) 150 MG 24 hr tablet Take 1 tablet (150 mg total) by mouth 2 (two) times daily.  60 tablet  5    ALLERGIES:  is allergic to lisinopril.  REVIEW OF SYSTEMS:  The rest of the 14-point review of system was negative.   Filed Vitals:   12/28/11 1016  BP: 114/71  Pulse: 81  Temp: 97.1 F (36.2 C)  Resp: 20   Wt Readings from Last 3 Encounters:  12/28/11 163 lb 8 oz (74.163 kg)  12/21/11 163 lb 12.8 oz (74.299 kg)  12/15/11 159 lb 4.8 oz (72.258 kg)   ECOG Performance status:   PHYSICAL EXAMINATION:  General:  well-nourished woman, in no acute distress.  Eyes:  no scleral icterus.  ENT:  There were no oropharyngeal lesions.  Neck was without thyromegaly.  Lymphatics:  Negative cervical, supraclavicular or axillary adenopathy.  Respiratory: lungs were clear bilaterally without wheezing or crackles.  Cardiovascular:  Regular rate and rhythm, S1/S2, without murmur, rub or gallop.  There was no pedal edema.  GI:  abdomen was soft, flat, nontender, nondistended, without organomegaly.  Muscoloskeletal:  no spinal tenderness of palpation of vertebral spine.  Skin  exam was without echymosis, petichae.  There was small erythematous nonpapular rash on the bilateral knees and extensor surfaces of her arms.  Neuro exam was nonfocal.  Patient was able to get on and off exam table without assistance.  Gait was normal.  Patient was alerted and oriented.  Attention was good.   Language was appropriate.  Mood was normal without depression.  Speech was not pressured.  Thought content was not tangential.      LABORATORY/RADIOLOGY DATA:  Lab Results  Component Value Date   WBC 1.7* 12/28/2011   HGB 12.3 12/28/2011  HCT 37.0 12/28/2011   PLT 94* 12/28/2011   GLUCOSE 93 12/11/2011   CHOL 190 12/08/2010   TRIG 104 12/08/2010   HDL 39* 12/08/2010   LDLDIRECT 111* 02/27/2010   LDLCALC 130* 12/08/2010   ALKPHOS 87 12/07/2011   ALT 9 12/07/2011   AST 11 12/07/2011   NA 141 12/11/2011   K 3.6 12/11/2011   CL 103 12/11/2011   CREATININE 0.66 12/11/2011   BUN 10 12/11/2011   CO2 30 12/11/2011   INR 1.10 10/19/2011    ASSESSMENT AND PLAN:    1. Recurrent Lung cancer:  - s/p 4 cycles of chemo.  She still has neutropenia despite being off of chemo last week.  Since she only has one more fraction of radiation after today, I recommended holding off on chemo to avoid risk of neutropenic fever.  She and her daughter expressed informed understanding and agreed with going off of chemo prematurely.   2. History of colon cancer: She has no evidence of recurrent or metastatic disease for her colon cancer. Surveillance for her history of colon cancer entails once-a-year CT scan for 5 years' total, with the next CT abdomen in November 2013. She had a surveillance colonoscopy in October 2012 by Dr Christella Hartigan, which showed polyps. Per his recommendation, the next colonoscopy is due in Oct 2015 but sooner if she has symptoms.   3. Smoking: only on smokeless cigarettes now.   4. Hypertension: She is on nebivolol, Catapres, HCTZ and clonidine per PCP with good control.   5. Hypercholesterolemia:  She is on pravastatin per PCP.   7. Anorexia: Due to new diagnosis of recurrent lung cancer. Her weight is stable.   8. Mild Calorie/protein malnutrition: Stable.   9. Nausea/vomting: well controlled with Zofran and Compazine.   10. 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 As we cancel further chemo, her ANC should recover soon.  There is no strong indication for Neuopgen or prophylactic antibiotics.   11. Follow up: in about 3 weeks to ensure that her cytopenia improves.  Return visit with me in about 6 weeks with restaging CT.

## 2011-12-29 ENCOUNTER — Ambulatory Visit
Admission: RE | Admit: 2011-12-29 | Discharge: 2011-12-29 | Disposition: A | Discharge status: Home / Self Care | Payer: Medicare Other | Source: Ambulatory Visit | Attending: Radiation Oncology | Admitting: Radiation Oncology

## 2011-12-29 ENCOUNTER — Encounter: Payer: Self-pay | Admitting: Radiation Oncology

## 2011-12-29 VITALS — Wt 163.5 lb

## 2011-12-29 DIAGNOSIS — C3490 Malignant neoplasm of unspecified part of unspecified bronchus or lung: Secondary | ICD-10-CM

## 2011-12-29 MED ORDER — RADIAPLEXRX EX GEL
Freq: Once | CUTANEOUS | Status: AC
  Administered 2011-12-29: 18:00:00 via TOPICAL

## 2011-12-29 NOTE — Progress Notes (Signed)
Weekly Management Note Current Dose:60 Gy  Projected Dose:60 Gy   Narrative:  The patient presents for routine under treatment assessment.  CBCT/MVCT images/Port film x-rays were reviewed.  The chart was checked. Finishes today.  C/o itching in lower extremities. Using oatmeal lotion and benadry with moderate relief. No dysphagia. Chemo held for counts. Good spirits.  Physical Findings:  Very slightly dark rash on back.   Vitals: There were no vitals filed for this visit. Weight:  Wt Readings from Last 3 Encounters:  12/29/11 163 lb 8 oz (74.163 kg)  12/28/11 163 lb 8 oz (74.163 kg)  12/21/11 163 lb 12.8 oz (74.299 kg)   Lab Results  Component Value Date   WBC 1.7* 12/28/2011   HGB 12.3 12/28/2011   HCT 37.0 12/28/2011   MCV 75.4* 12/28/2011   PLT 94* 12/28/2011   Lab Results  Component Value Date   CREATININE 0.66 12/11/2011   BUN 10 12/11/2011   NA 141 12/11/2011   K 3.6 12/11/2011   CL 103 12/11/2011   CO2 30 12/11/2011     Impression:  The patient tolerated RT remarkably well.   Plan:  Follow up in 1 month. Has scan scheduled in September and f/u with Ha.

## 2011-12-29 NOTE — Progress Notes (Signed)
  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   She had minimal dysphagia that was treated with carafate. She maintained an active lifestyle throughout her course of treatment.   Plan: The patient has completed radiation treatment. The patient will return to radiation oncology clinic for routine followup in one month. I advised them to call or return sooner if they have any questions or concerns related to their recovery or treatment.  ------------------------------------------------  Lurline Hare, MD

## 2011-12-29 NOTE — Progress Notes (Signed)
HERE TODAY FOR PUT OF LUNG.  NO C/O OF COUGHING , ONLY C/O ITCHING FROM CHEMO.  TX SITE LOOKS GOOD, USING RADIAPLEX.

## 2011-12-29 NOTE — Progress Notes (Signed)
[facility] Radiation Oncology Simulation and Treatment Planning Note   Name: Lauren [name] MRN: [id number]  Date: 10/29/2011  DOB: Sep 18, 1950  Status: outpatient    DIAGNOSIS: Lung Cancer  SIDE: right   CONSENT VERIFIED: yes   SET UP AND IMMOBILIZATION: Patient is setup supine with arms in a wing board.   NARRATIVE: The patient was brought to the CT Simulation planning suite.  Identity was confirmed.  All relevant records and images related to the planned course of therapy were reviewed.  Then, the patient was positioned in a stable reproducible clinical set-up for radiation therapy.  CT images were obtained.  Skin markings were placed.  The CT images were loaded into the planning software where the target and avoidance structures were contoured.  The radiation prescription was entered and confirmed.   TREATMENT PLANNING NOTE:  Treatment planning then occurred.  I have requested IMRT with DVH of the spinal cord, total lungs and gross tumor volume due to the large V20 and V5 that were obtained with conventional planning.   Special treatment procedure will be performed as Catalina Gravel will be receiving concurrent chemotherapy.   I have ordered a consult with the dietician for monitoring.  I will also be verifying that weekly lab values are appropriate.

## 2012-01-11 ENCOUNTER — Telehealth: Payer: Self-pay | Admitting: *Deleted

## 2012-01-11 NOTE — Telephone Encounter (Signed)
Pt called to report worsening rash, small red bumps, which itch especially at night.  Spread out over back, thighs, arms, neck and feet. Using Biafine cream once daily.  Radiaplex gel once daily and Hydrocortisone cream once or twice daily w/o any relief.  Has not tried benadryl.  Instructed pt to try benadryl 25 mg q 6hrs prn itching.  She verbalized understanding, but wants to know why she has the rash and if anything else to do to treat the rash?   It has been almost one month since her last chemo tx and she denies any new medications.  Informed pt I will notify Dr. Gaylyn Rong and call her back.

## 2012-01-11 NOTE — Telephone Encounter (Signed)
The rash is most likely not due to chemo since last chemo was more than 1 month ago.  She can take Benadryl 25mg  PO q6h prn pruritus and hydrocortisone cream on the rash BID prn.    Please have pt talked with her PCP and see if she needs to be referred to Dermatology per PCP's opinion.

## 2012-01-12 NOTE — Telephone Encounter (Signed)
Left VM for pt 8/26 at aprox 4:30 pm.  Instructed to take Benadryl PRN itching and contact PCP to evaluate rash and let PCP decide if needs to see Dermatologist.  Informed rash not r/t chemotherapy per Dr. Gaylyn Rong and instructed to call back if any further questions/concerns.

## 2012-01-17 DIAGNOSIS — Z923 Personal history of irradiation: Secondary | ICD-10-CM

## 2012-01-17 HISTORY — DX: Personal history of irradiation: Z92.3

## 2012-01-19 ENCOUNTER — Encounter: Payer: Self-pay | Admitting: Oncology

## 2012-01-19 ENCOUNTER — Encounter: Payer: Self-pay | Admitting: Family Medicine

## 2012-01-19 ENCOUNTER — Other Ambulatory Visit (HOSPITAL_BASED_OUTPATIENT_CLINIC_OR_DEPARTMENT_OTHER): Payer: Medicare Other | Admitting: Lab

## 2012-01-19 ENCOUNTER — Ambulatory Visit (INDEPENDENT_AMBULATORY_CARE_PROVIDER_SITE_OTHER): Payer: Medicare Other | Admitting: Family Medicine

## 2012-01-19 ENCOUNTER — Ambulatory Visit (HOSPITAL_BASED_OUTPATIENT_CLINIC_OR_DEPARTMENT_OTHER): Payer: Medicare Other | Admitting: Oncology

## 2012-01-19 VITALS — BP 112/70 | HR 86 | Temp 97.2°F | Resp 20 | Ht 64.0 in | Wt 162.3 lb

## 2012-01-19 VITALS — BP 119/74 | HR 87 | Temp 98.1°F | Ht 64.0 in | Wt 164.0 lb

## 2012-01-19 DIAGNOSIS — C3490 Malignant neoplasm of unspecified part of unspecified bronchus or lung: Secondary | ICD-10-CM

## 2012-01-19 DIAGNOSIS — Z85038 Personal history of other malignant neoplasm of large intestine: Secondary | ICD-10-CM

## 2012-01-19 DIAGNOSIS — Z1231 Encounter for screening mammogram for malignant neoplasm of breast: Secondary | ICD-10-CM

## 2012-01-19 DIAGNOSIS — R21 Rash and other nonspecific skin eruption: Secondary | ICD-10-CM

## 2012-01-19 DIAGNOSIS — Z0000 Encounter for general adult medical examination without abnormal findings: Secondary | ICD-10-CM

## 2012-01-19 DIAGNOSIS — F17200 Nicotine dependence, unspecified, uncomplicated: Secondary | ICD-10-CM

## 2012-01-19 DIAGNOSIS — N959 Unspecified menopausal and perimenopausal disorder: Secondary | ICD-10-CM

## 2012-01-19 DIAGNOSIS — C189 Malignant neoplasm of colon, unspecified: Secondary | ICD-10-CM

## 2012-01-19 DIAGNOSIS — C3410 Malignant neoplasm of upper lobe, unspecified bronchus or lung: Secondary | ICD-10-CM

## 2012-01-19 DIAGNOSIS — E7800 Pure hypercholesterolemia, unspecified: Secondary | ICD-10-CM

## 2012-01-19 LAB — CBC WITH DIFFERENTIAL/PLATELET
BASO%: 0.4 % (ref 0.0–2.0)
Basophils Absolute: 0 10*3/uL (ref 0.0–0.1)
EOS%: 0.7 % (ref 0.0–7.0)
HGB: 12.8 g/dL (ref 11.6–15.9)
MCH: 26.5 pg (ref 25.1–34.0)
MCHC: 33.8 g/dL (ref 31.5–36.0)
MCV: 78.2 fL — ABNORMAL LOW (ref 79.5–101.0)
MONO%: 14.5 % — ABNORMAL HIGH (ref 0.0–14.0)
RDW: 21.5 % — ABNORMAL HIGH (ref 11.2–14.5)
lymph#: 0.7 10*3/uL — ABNORMAL LOW (ref 0.9–3.3)

## 2012-01-19 LAB — COMPREHENSIVE METABOLIC PANEL (CC13)
ALT: 10 U/L (ref 0–55)
Alkaline Phosphatase: 106 U/L (ref 40–150)
Sodium: 141 mEq/L (ref 136–145)
Total Bilirubin: 0.7 mg/dL (ref 0.20–1.20)
Total Protein: 6.7 g/dL (ref 6.4–8.3)

## 2012-01-19 MED ORDER — HYDROXYZINE HCL 10 MG PO TABS: 10.0000 mg | ORAL_TABLET | Freq: Three times a day (TID) | ORAL | Status: AC | PRN

## 2012-01-19 MED ORDER — ZOSTER VACCINE LIVE 19400 UNT/0.65ML ~~LOC~~ SOLR: 0.6500 mL | Freq: Once | SUBCUTANEOUS | Status: DC

## 2012-01-19 NOTE — Patient Instructions (Addendum)
White blood count has improved off chemotherapy and radiation.  Keep appointments on 9/20 for labs and CT scan. Follow-up with Dr Gaylyn Rong on 02/08/12 to review results.

## 2012-01-19 NOTE — Progress Notes (Signed)
[facility]  [phone] [phone]   OFFICE PROGRESS NOTE   Cc:  CHAMBERLAIN,RACHEL, MD  PAST DIAGNOSIS AND PAST THERAPY:  1. 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 Her post resection surveillance colonoscopy onOctober 14, 2009, by Dr. Christella Hartigan was negative. 2. pT1a, pN0 well differentiated adenocarcinoma of lung; s/p left upper lobectomy on 06/16/2010. 3. Concurrent chemoradiation on 11/16/2011 with daily XRT and weekly Carboplatin/Taxol. Treatment was completed on 12/29/11. Her last 2 doses of chemotherapy were held due to neutropenia and thrombocytopenia.  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   CURRENT THERAPY: Watchful observation.  INTERVAL HISTORY: Lauren [name] 61 y.o. female returns for regular follow up by herself.  She reported feeling relatively well; side effects from chemo and radiation has resolved. She still has some pruritic rash on her thighs and arms.  Using Hydrocortisone cream and Benadryl with minimal relief. States itching is worse at night and wakes her up. States that she found several small brown bugs in her bed last night; she did an Therapist, art and thinks that they are consistent with bed bugs. Denies new medications, soaps, and detergents. The patient is scheduled to see her PCP later today. SheShe has mild nonproductive cough.  She denied fever, SOB, PND, orthopnea, pedal edema.  She only smokes electronic cigarettes now.  She has mild fatigue; however, she is independent of all activities of daily living.   Patient denies headache, visual changes, confusion, drenching night sweats, palpable lymph node swelling, mucositis, odynophagia, dysphagia, nausea vomiting, jaundice, chest pain, palpitation, gum bleeding, epistaxis,  hematemesis, hemoptysis, abdominal pain, abdominal swelling, early satiety, melena, hematochezia, hematuria, skin rash, spontaneous bleeding, joint swelling, joint pain, heat or cold intolerance, bowel bladder incontinence, back pain, focal motor weakness, paresthesia, depression, suicidal or homicidal ideation, feeling hopelessness.   Past Medical History  Diagnosis Date  . Depression   . Hyperlipidemia   . Hypertension   . Colon cancer   . Lung cancer     PET scan 04/28/2010; primary: increase in size 02/2010  . Lung nodule     FNA ordered for 04/02/10 by HA>pos Ca  . Chronic folliculitis     of groin  . Fibrocystic breast changes   . Family history of trichomonal vaginitis 05/2005  . Postmenopausal   . Depressive disorder   . Hypercholesterolemia   . GERD (gastroesophageal reflux disease)   . Cerebral aneurysm   . Back pain   . Shortness of breath   . COPD (chronic obstructive pulmonary disease)   . Coronary artery disease   . Arthritis   . Weight loss 10/22/2011    Past Surgical History  Procedure Date  . Colon surgery   . Tubal ligation   . Lung lobectomy 2012  . Hernia repair   . Breast surgery     Bil lumpectomy  . Back surgery   . Mediastinoscopy 10/19/2011    Procedure: MEDIASTINOSCOPY;  Surgeon: Alleen Borne, MD;  Location: [facility] OR;  Service: Thoracic;  Laterality: N/A;  . Cardiac cath x3     Current Outpatient Prescriptions  Medication Sig Dispense Refill  . albuterol (PROVENTIL HFA;VENTOLIN HFA) 108 (90 BASE) MCG/ACT inhaler Inhale 2 puffs into the lungs every 6 (six) hours as needed.      Marland Kitchen aspirin 81 MG EC tablet Take 81  mg by mouth daily.      . cholecalciferol (VITAMIN D) 1000 UNITS tablet Take 1,000 Units by mouth daily.      . cloNIDine (CATAPRES) 0.1 MG tablet Take 0.1 mg by mouth at bedtime.      . diclofenac sodium (VOLTAREN) 1 % GEL Apply 1 application topically 4 (four) times daily. As needed for pain      . emollient (BIAFINE) cream Apply  topically as needed.      . fish oil-omega-3 fatty acids 1000 MG capsule Take 2,400 g by mouth daily. Also contains FLAX BORAGE      . hydrochlorothiazide (HYDRODIURIL) 25 MG tablet Take 25 mg by mouth daily.      . hydrocortisone cream 0.5 % 2 (two) times daily.      . nebivolol (BYSTOLIC) 10 MG tablet Take 10 mg by mouth daily.      . nitroGLYCERIN (NITROSTAT) 0.4 MG SL tablet Place 0.4 mg under the tongue every 5 (five) minutes as needed.      Marland Kitchen omeprazole (PRILOSEC) 20 MG capsule Take 20 mg by mouth daily.        . pravastatin (PRAVACHOL) 40 MG tablet Take 80 mg by mouth daily.      Marland Kitchen Spirulina POWD by Does not apply route daily.      . traMADol (ULTRAM) 50 MG tablet Take 50 mg by mouth every 6 (six) hours as needed. As needed for pain.      Marland Kitchen zolpidem (AMBIEN) 5 MG tablet Take 5 mg by mouth at bedtime as needed.      Marland Kitchen DISCONTD: buPROPion (WELLBUTRIN XL) 150 MG 24 hr tablet Take 1 tablet (150 mg total) by mouth 2 (two) times daily.  60 tablet  5    ALLERGIES:  is allergic to lisinopril.  REVIEW OF SYSTEMS:  The rest of the 14-point review of system was negative.   Filed Vitals:   01/19/12 1035  BP: 112/70  Pulse: 86  Temp: 97.2 F (36.2 C)  Resp: 20   Wt Readings from Last 3 Encounters:  01/19/12 162 lb 4.8 oz (73.619 kg)  12/29/11 163 lb 8 oz (74.163 kg)  12/28/11 163 lb 8 oz (74.163 kg)   ECOG Performance status: 1  PHYSICAL EXAMINATION:  General:  well-nourished woman, in no acute distress.  Eyes:  no scleral icterus.  ENT:  There were no oropharyngeal lesions.  Neck was without thyromegaly.  Lymphatics:  Negative cervical, supraclavicular or axillary adenopathy.  Respiratory: lungs were clear bilaterally without wheezing or crackles.  Cardiovascular:  Regular rate and rhythm, S1/S2, without murmur, rub or gallop.  There was no pedal edema.  GI:  abdomen was soft, flat, nontender, nondistended, without organomegaly.  Muscoloskeletal:  no spinal tenderness of palpation of  vertebral spine.  Skin exam was without echymosis, petichae.  There was small erythematous nonpapular rash on the bilateral knees and extensor surfaces of her arms.  Neuro exam was nonfocal.  Patient was able to get on and off exam table without assistance.  Gait was normal.  Patient was alerted and oriented.  Attention was good.   Language was appropriate.  Mood was normal without depression.  Speech was not pressured.  Thought content was not tangential.     LABORATORY/RADIOLOGY DATA:  Lab Results  Component Value Date   WBC 3.0* 01/19/2012   HGB 12.8 01/19/2012   HCT 37.8 01/19/2012   PLT 174 01/19/2012   GLUCOSE 93 12/11/2011   CHOL 190 12/08/2010  TRIG 104 12/08/2010   HDL 39* 12/08/2010   LDLDIRECT 111* 02/27/2010   LDLCALC 130* 12/08/2010   ALKPHOS 87 12/07/2011   ALT 9 12/07/2011   AST 11 12/07/2011   NA 141 12/11/2011   K 3.6 12/11/2011   CL 103 12/11/2011   CREATININE 0.66 12/11/2011   BUN 10 12/11/2011   CO2 30 12/11/2011   INR 1.10 10/19/2011    ASSESSMENT AND PLAN:    1. Recurrent Lung cancer:  - s/p 4 cycles of chemo.  Neutropenia has resolved.  Plan is for restaging CT scan on 02/05/12 with follow-up on 9/23 to review results.  2. History of colon cancer: She has no evidence of recurrent or metastatic disease for her colon cancer. Surveillance for her history of colon cancer entails once-a-year CT scan for 5 years' total, with the next CT abdomen in November 2013. She had a surveillance colonoscopy in October 2012 by Dr Christella Hartigan, which showed polyps. Per his recommendation, the next colonoscopy is due in Oct 2015 but sooner if she has symptoms.   3. Smoking: only on smokeless cigarettes now.   4. Hypertension: She is on nebivolol, Catapres, HCTZ and clonidine per PCP with good control.   5. Hypercholesterolemia: She is on pravastatin per PCP.   7. Anorexia: Due to new diagnosis of recurrent lung cancer. Her weight is stable.   8. Mild Calorie/protein malnutrition: Stable.   9.  Nausea/vomting: Resolved.  10. Rash: Doubtful this is due to chemotherapy as she has been off treatment for over 1 month. She will follow-up with PCP later today for further recommendations.  11. Follow up: On 9/20 for labs and CT scan. Visit with Dr Gaylyn Rong on 9/23.

## 2012-01-19 NOTE — Patient Instructions (Signed)
It was good to see you.  Congratulations on quitting smoking!  For your rash, you can continue using the steroid cream, try hydroxyzine for itching in addition to the benadryl.   The office will call you for your appointment at the Breast center for your mammogram and Bone Density Scan.  Also, they will call you with your eye doctor appointment.   Please get the Shingles vaccine (Zostavax) filled a the pharmacy, you can make a nurse visit to have it given here.   You are up to date on your Tdap (Tetnus) shot and your Pneumococcal (Pneumonia) shots, you will need both of them again in 2016.

## 2012-01-20 ENCOUNTER — Telehealth: Payer: Self-pay | Admitting: Family Medicine

## 2012-01-20 DIAGNOSIS — Z0000 Encounter for general adult medical examination without abnormal findings: Secondary | ICD-10-CM | POA: Insufficient documentation

## 2012-01-20 DIAGNOSIS — R21 Rash and other nonspecific skin eruption: Secondary | ICD-10-CM | POA: Insufficient documentation

## 2012-01-20 MED ORDER — ZOSTER VACCINE LIVE 19400 UNT/0.65ML ~~LOC~~ SOLR: 0.6500 mL | Freq: Once | SUBCUTANEOUS | Status: AC

## 2012-01-20 NOTE — Telephone Encounter (Signed)
Rx sent, please notify [name].

## 2012-01-20 NOTE — Assessment & Plan Note (Signed)
I agree that this would likely have subsided by now if it was from chemo.  Suspect it is from bedbugs as the patient does, and will resolve if she has gotten rid of them.  Will add hydroxyzine for itching, continue steroid cream and benadryl prn. F/U if not improving.

## 2012-01-20 NOTE — Assessment & Plan Note (Signed)
Will order fasting lipid panel. Continue pravastatin.

## 2012-01-20 NOTE — Telephone Encounter (Signed)
Done. Lauren [name]  

## 2012-01-20 NOTE — Assessment & Plan Note (Signed)
Will order fasting lipids to be done, and Rx for Zostavax sent to her pharmacy.  Will have her scheduled to have mammogram and Dexa scan done at the Breast imaging center Will refer to eye doctor for dilated eye exam and glaucoma screening.

## 2012-01-20 NOTE — Telephone Encounter (Signed)
Pt found out that her insurance will cover her shingles shot - needs to pick up script for her to pick - pls call when ready

## 2012-01-20 NOTE — Assessment & Plan Note (Signed)
Patient has currently quit, using E-cigarettes, I congratulated her on this, will continue to monitor.

## 2012-01-20 NOTE — Telephone Encounter (Signed)
[facility] has told the patient that they don't carry the Shingles vaccine so she would like it sent to [facility] on [address].  She doesn't wan to change her pharmacy for all med, just for this vaccine.  Please call her if there are any problems.

## 2012-01-20 NOTE — Telephone Encounter (Signed)
Pt will check and see if [facility] on [address] has the vaccine.  If not she will give [place] a call back .Lauren [name], [name]

## 2012-01-20 NOTE — Progress Notes (Signed)
  Subjective:    Patient ID: Lauren [name], female    DOB: 11-11-1950, 61 y.o.   MRN: [id number]  HPI  Lauren [name] comes in for follow up.    Rash- she has had an itchy rash for at least 6 weeks.  She says that she initially thought it was from Chemo (undergoing tx for Lung Ca), but that she has been off chemo almost a month and it hasn't gone a way.  She has been itching at night the worst, and has red flat bumps on arms, legs, neck.  She says yesterday she found a bed bug in her bed.  She says she got a comforter from a charity and thinks that is what brought the bed bugs in.  She has now gotten rid of the comforter, washed sheets in hot water, and sprayed her mattress with Lysol.  She is still itching.  She has a steroid cream that seems to help, and benadryl seems to help for a short period of time, but wears off.   She is very excited to tell me today that she has quit smoking.  She is using E-cigarettes, but no real tobacco, and feels good about this.   She has also brought in a letter from [place] health care, reminding her to stay up to date on her health maintenance:   Labs: Wants fasting lipids drawn, has already eaten today.   Mammogram and Dexa scan: is over due for mammogram and has not ever had a dexa scan.   Vaccines: Is up to date on pneumococcal, Tdap, and influenza.  She has not had Zostavax.    Eye Exam: Insurance suggests Dilated eye exam and glaucoma screening  Review of Systems See HPI    Objective:   Physical Exam BP 119/74  Pulse 87  Temp 98.1 F (36.7 C) (Oral)  Ht 5\' 4"  (1.626 m)  Wt 164 lb (74.39 kg)  BMI 28.15 kg/m2 General appearance: alert, cooperative and no distress Eyes : Fundoscopic exam WNL, PERRL, EOMIT Lungs: clear to auscultation bilaterally and decreased breath sounds LL base Heart: regular rate and rhythm, S1, S2 normal, no murmur, click, rub or gallop Pulses: 2+ and symmetric Skin: Patient has .5-1cm red slightly raised lesions on  arms, legs, neck. No signs of suprainfection.        Assessment & Plan:

## 2012-01-25 ENCOUNTER — Other Ambulatory Visit: Payer: Self-pay

## 2012-02-01 ENCOUNTER — Ambulatory Visit (HOSPITAL_COMMUNITY)
Admission: RE | Admit: 2012-02-01 | Discharge: 2012-02-01 | Disposition: A | Discharge status: Home / Self Care | Payer: Medicare Other | Source: Ambulatory Visit | Attending: Family Medicine | Admitting: Family Medicine

## 2012-02-01 DIAGNOSIS — Z13820 Encounter for screening for osteoporosis: Secondary | ICD-10-CM | POA: Insufficient documentation

## 2012-02-01 DIAGNOSIS — N959 Unspecified menopausal and perimenopausal disorder: Secondary | ICD-10-CM

## 2012-02-01 DIAGNOSIS — Z1231 Encounter for screening mammogram for malignant neoplasm of breast: Secondary | ICD-10-CM | POA: Insufficient documentation

## 2012-02-01 DIAGNOSIS — Z780 Asymptomatic menopausal state: Secondary | ICD-10-CM | POA: Insufficient documentation

## 2012-02-04 ENCOUNTER — Encounter: Payer: Self-pay | Admitting: Radiation Oncology

## 2012-02-04 ENCOUNTER — Ambulatory Visit
Admission: RE | Admit: 2012-02-04 | Discharge: 2012-02-04 | Disposition: A | Discharge status: Home / Self Care | Payer: Medicare Other | Source: Ambulatory Visit | Attending: Radiation Oncology | Admitting: Radiation Oncology

## 2012-02-04 ENCOUNTER — Encounter: Payer: Self-pay | Admitting: Family Medicine

## 2012-02-04 VITALS — BP 126/82 | HR 80 | Temp 98.2°F | Resp 18 | Wt 164.0 lb

## 2012-02-04 DIAGNOSIS — C3490 Malignant neoplasm of unspecified part of unspecified bronchus or lung: Secondary | ICD-10-CM

## 2012-02-04 NOTE — Progress Notes (Signed)
Department of Radiation Oncology  Phone:  [phone] Fax:        [phone]   Name: Lauren [name]   DOB: [birth year]  MRN: [id number]    Date: 02/04/2012  Follow Up Visit Note  Diagnosis: Recurrent lung cancer  Interval since last radiation: 1 month  Interval History: Lauren [name] presents today for routine followup.  She is eating normally and feeling well. Her breathing symptoms are stable. She is very proud of herself for quitting smoking. She is only using electronic cigarettes at this point. She is interested in volunteering. She has appointment for a CT scan tomorrow and a followup with Dr. Gaylyn Rong next week.  Allergies:  Allergies  Allergen Reactions  . Lisinopril     REACTION: cough    Medications:  Current Outpatient Prescriptions  Medication Sig Dispense Refill  . albuterol (PROVENTIL HFA;VENTOLIN HFA) 108 (90 BASE) MCG/ACT inhaler Inhale 2 puffs into the lungs every 6 (six) hours as needed.      Marland Kitchen aspirin 81 MG EC tablet Take 81 mg by mouth daily.      . cholecalciferol (VITAMIN D) 1000 UNITS tablet Take 1,000 Units by mouth daily.      . cloNIDine (CATAPRES) 0.1 MG tablet Take 0.1 mg by mouth at bedtime.      . diclofenac sodium (VOLTAREN) 1 % GEL Apply 1 application topically 4 (four) times daily. As needed for pain      . emollient (BIAFINE) cream Apply topically as needed.      . fish oil-omega-3 fatty acids 1000 MG capsule Take 2,400 g by mouth daily. Also contains FLAX BORAGE      . hydrochlorothiazide (HYDRODIURIL) 25 MG tablet Take 25 mg by mouth daily.      . hydrocortisone cream 0.5 % 2 (two) times daily.      . hydrOXYzine (ATARAX/VISTARIL) 10 MG tablet       . nebivolol (BYSTOLIC) 10 MG tablet Take 10 mg by mouth daily.      . nitroGLYCERIN (NITROSTAT) 0.4 MG SL tablet Place 0.4 mg under the tongue every 5 (five) minutes as needed.      Marland Kitchen omeprazole (PRILOSEC) 20 MG capsule Take 20 mg by mouth daily.        . pravastatin (PRAVACHOL) 40 MG tablet Take  80 mg by mouth daily.      Marland Kitchen Spirulina POWD by Does not apply route daily.      . traMADol (ULTRAM) 50 MG tablet Take 50 mg by mouth every 6 (six) hours as needed. As needed for pain.      Marland Kitchen zolpidem (AMBIEN) 5 MG tablet Take 5 mg by mouth at bedtime as needed.      Marland Kitchen DISCONTD: buPROPion (WELLBUTRIN XL) 150 MG 24 hr tablet Take 1 tablet (150 mg total) by mouth 2 (two) times daily.  60 tablet  5    Physical Exam:   weight is 164 lb (74.39 kg). Her oral temperature is 98.2 F (36.8 C). Her blood pressure is 126/82 and her pulse is 80. Her respiration is 18 and oxygen saturation is 100%.  She has no skin changes over her back or chest. She is alert and oriented x3.  IMPRESSION: Lauren [name] is a 61 y.o. female status post chemoradiation for recurrent non-small cell lung cancer with resolving acute effects of treatment  PLAN:  Lauren [name] looks great. She is in great spirits and is healed up well. She has followup imaging scheduled Dr. Irving Burton. I have not scheduled followup  with her. I encouraged her to volunteer is a think she has a lot of positive energy that she could capsule in other patients. We discussed that reirradiation to this area would be difficult but that radiation could be given to other parts of her body if lung cancer did recur.    Lurline Hare, MD

## 2012-02-04 NOTE — Progress Notes (Signed)
Patient presents to the clinic today unaccompanied for a follow up appointment with Dr. Michell Heinrich. Patient is alert and oriented to person, place, and time. No distress noted. Steady gait noted. Pleasant affect noted. Patient reports chronic low back pain related to effects of weather changing. Patient reports she takes tramadol for low back pain. Patient denies cough. Patient reports shortness of breath only great exertion. Patient's weight stable. Patient reports a normal energy level. Patient denies difficulty or pain with swallowing. Patent reports dry mouth related to effect of medication. Patient has no complaints at this time. Reported all findings to Dr. Michell Heinrich.

## 2012-02-05 ENCOUNTER — Other Ambulatory Visit (HOSPITAL_BASED_OUTPATIENT_CLINIC_OR_DEPARTMENT_OTHER): Payer: Medicare Other | Admitting: Lab

## 2012-02-05 ENCOUNTER — Ambulatory Visit (HOSPITAL_COMMUNITY)
Admission: RE | Admit: 2012-02-05 | Discharge: 2012-02-05 | Disposition: A | Discharge status: Home / Self Care | Payer: Medicare Other | Source: Ambulatory Visit | Attending: Oncology | Admitting: Oncology

## 2012-02-05 DIAGNOSIS — E079 Disorder of thyroid, unspecified: Secondary | ICD-10-CM | POA: Insufficient documentation

## 2012-02-05 DIAGNOSIS — Z902 Acquired absence of lung [part of]: Secondary | ICD-10-CM | POA: Insufficient documentation

## 2012-02-05 DIAGNOSIS — C189 Malignant neoplasm of colon, unspecified: Secondary | ICD-10-CM

## 2012-02-05 DIAGNOSIS — R918 Other nonspecific abnormal finding of lung field: Secondary | ICD-10-CM | POA: Insufficient documentation

## 2012-02-05 DIAGNOSIS — J438 Other emphysema: Secondary | ICD-10-CM | POA: Insufficient documentation

## 2012-02-05 DIAGNOSIS — I2510 Atherosclerotic heart disease of native coronary artery without angina pectoris: Secondary | ICD-10-CM | POA: Insufficient documentation

## 2012-02-05 DIAGNOSIS — C3490 Malignant neoplasm of unspecified part of unspecified bronchus or lung: Secondary | ICD-10-CM

## 2012-02-05 LAB — CBC WITH DIFFERENTIAL/PLATELET
Basophils Absolute: 0 10*3/uL (ref 0.0–0.1)
Eosinophils Absolute: 0.1 10*3/uL (ref 0.0–0.5)
HCT: 37.4 % (ref 34.8–46.6)
HGB: 12.5 g/dL (ref 11.6–15.9)
LYMPH%: 14.4 % (ref 14.0–49.7)
MCH: 26.7 pg (ref 25.1–34.0)
MCV: 80 fL (ref 79.5–101.0)
MONO%: 12 % (ref 0.0–14.0)
NEUT#: 3.3 10*3/uL (ref 1.5–6.5)
NEUT%: 70.8 % (ref 38.4–76.8)
Platelets: 204 10*3/uL (ref 145–400)
RDW: 19.9 % — ABNORMAL HIGH (ref 11.2–14.5)

## 2012-02-05 LAB — COMPREHENSIVE METABOLIC PANEL (CC13)
Albumin: 3.1 g/dL — ABNORMAL LOW (ref 3.5–5.0)
Alkaline Phosphatase: 107 U/L (ref 40–150)
BUN: 11 mg/dL (ref 7.0–26.0)
Creatinine: 0.8 mg/dL (ref 0.6–1.1)
Glucose: 110 mg/dl — ABNORMAL HIGH (ref 70–99)
Potassium: 3.3 mEq/L — ABNORMAL LOW (ref 3.5–5.1)

## 2012-02-05 MED ORDER — IOHEXOL 300 MG/ML  SOLN
100.0000 mL | Freq: Once | INTRAMUSCULAR | Status: AC | PRN
  Administered 2012-02-05: 100 mL via INTRAVENOUS

## 2012-02-08 ENCOUNTER — Telehealth: Payer: Self-pay | Admitting: Oncology

## 2012-02-08 ENCOUNTER — Ambulatory Visit (HOSPITAL_BASED_OUTPATIENT_CLINIC_OR_DEPARTMENT_OTHER): Payer: Medicare Other | Admitting: Oncology

## 2012-02-08 VITALS — BP 116/73 | HR 78 | Temp 97.5°F | Resp 20 | Ht 64.0 in | Wt 165.6 lb

## 2012-02-08 DIAGNOSIS — C189 Malignant neoplasm of colon, unspecified: Secondary | ICD-10-CM

## 2012-02-08 DIAGNOSIS — C187 Malignant neoplasm of sigmoid colon: Secondary | ICD-10-CM

## 2012-02-08 DIAGNOSIS — C3410 Malignant neoplasm of upper lobe, unspecified bronchus or lung: Secondary | ICD-10-CM

## 2012-02-08 DIAGNOSIS — C3490 Malignant neoplasm of unspecified part of unspecified bronchus or lung: Secondary | ICD-10-CM

## 2012-02-08 NOTE — Telephone Encounter (Signed)
appts made and printed for pt aom °

## 2012-02-08 NOTE — Progress Notes (Signed)
[facility]  [phone] [phone]   OFFICE PROGRESS NOTE   Cc:  CHAMBERLAIN,RACHEL, MD  PAST DIAGNOSIS AND PAST THERAPY:  1. 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 Her post resection surveillance colonoscopy onOctober 14, 2009, by Dr. Christella Hartigan was negative. 2. pT1a, pN0 well differentiated adenocarcinoma of lung; s/p left upper lobectomy on 06/16/2010. 3. 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   CURRENT THERAPY: watchful observation.   INTERVAL HISTORY: Lauren [name] 61 y.o. female returns for regular follow by herself. She reports feeling well. She has mild fatigue; however, she is fully independent of all activities of living and doing chores around the house. She no longer smokes regular cigarettes; she uses about 2 or 3 electronic cigarettes a day.  She has mild dyspnea exertion prolonged ambulation more than a city block or fast ambulation.  She denies cough, fever, hemoptysis, PND, orthopnea, pedal edema, nausea vomiting, abdominal pain, melena, hematochezia.   The rest of the 14 point review of system was negative.  Past Medical History  Diagnosis Date  . Depression   . Hyperlipidemia   . Hypertension   . Colon cancer   . Lung cancer     PET scan 04/28/2010; primary: increase in size 02/2010  . Lung nodule     FNA ordered for 04/02/10 by HA>pos Ca  . Chronic folliculitis     of groin  . Fibrocystic breast changes   . Family history of trichomonal vaginitis 05/2005  . Postmenopausal   . Depressive disorder   . Hypercholesterolemia   . GERD (gastroesophageal reflux disease)   . Cerebral aneurysm   . Back pain   . Shortness of breath     . COPD (chronic obstructive pulmonary disease)   . Coronary artery disease   . Arthritis   . Weight loss 10/22/2011    Past Surgical History  Procedure Date  . Colon surgery   . Tubal ligation   . Lung lobectomy 2012  . Hernia repair   . Breast surgery     Bil lumpectomy  . Back surgery   . Mediastinoscopy 10/19/2011    Procedure: MEDIASTINOSCOPY;  Surgeon: Alleen Borne, MD;  Location: [facility] OR;  Service: Thoracic;  Laterality: N/A;  . Cardiac cath x3     Current Outpatient Prescriptions  Medication Sig Dispense Refill  . albuterol (PROVENTIL HFA;VENTOLIN HFA) 108 (90 BASE) MCG/ACT inhaler Inhale 2 puffs into the lungs every 6 (six) hours as needed.      Marland Kitchen aspirin 81 MG EC tablet Take 81 mg by mouth daily.      . cholecalciferol (VITAMIN D) 1000 UNITS tablet Take 1,000 Units by mouth daily.      . cloNIDine (CATAPRES) 0.1 MG tablet Take 0.1 mg by mouth at bedtime.      . diclofenac sodium (VOLTAREN) 1 % GEL Apply 1 application topically 4 (four) times daily. As needed for pain      . emollient (BIAFINE) cream Apply topically as needed.      . fish oil-omega-3 fatty acids 1000 MG capsule Take 2,400 g by mouth daily. Also contains FLAX BORAGE      . hydrochlorothiazide (HYDRODIURIL) 25 MG tablet Take 25 mg by mouth daily.      Marland Kitchen  hydrocortisone cream 0.5 % 2 (two) times daily.      . hydrOXYzine (ATARAX/VISTARIL) 10 MG tablet       . nebivolol (BYSTOLIC) 10 MG tablet Take 10 mg by mouth daily.      . nitroGLYCERIN (NITROSTAT) 0.4 MG SL tablet Place 0.4 mg under the tongue every 5 (five) minutes as needed.      Marland Kitchen omeprazole (PRILOSEC) 20 MG capsule Take 20 mg by mouth daily.        . pravastatin (PRAVACHOL) 40 MG tablet Take 80 mg by mouth daily.      Marland Kitchen Spirulina POWD by Does not apply route daily.      . traMADol (ULTRAM) 50 MG tablet Take 50 mg by mouth every 6 (six) hours as needed. As needed for pain.      Marland Kitchen zolpidem (AMBIEN) 5 MG tablet Take 5 mg by mouth at bedtime as needed.       Marland Kitchen DISCONTD: buPROPion (WELLBUTRIN XL) 150 MG 24 hr tablet Take 1 tablet (150 mg total) by mouth 2 (two) times daily.  60 tablet  5    ALLERGIES:  is allergic to lisinopril.  REVIEW OF SYSTEMS:  The rest of the 14-point review of system was negative.   Filed Vitals:   02/08/12 1011  BP: 116/73  Pulse: 78  Temp: 97.5 F (36.4 C)  Resp: 20   Wt Readings from Last 3 Encounters:  02/08/12 165 lb 9.6 oz (75.116 kg)  02/04/12 164 lb (74.39 kg)  01/19/12 164 lb (74.39 kg)   ECOG Performance status: 0-1  PHYSICAL EXAMINATION:   General: Thin-appearing woman, in no acute distress.  Eyes:  no scleral icterus.  ENT:  There were no oropharyngeal lesions.  Neck was without thyromegaly.  Lymphatics:  Negative cervical, supraclavicular or axillary adenopathy.  Respiratory: lungs were clear bilaterally without wheezing or crackles.  Cardiovascular:  Regular rate and rhythm, S1/S2, without murmur, rub or gallop.  There was no pedal edema.  GI:  abdomen was soft, flat, nontender, nondistended, without organomegaly.  Muscoloskeletal:  no spinal tenderness of palpation of vertebral spine.  Skin exam was without echymosis, petichae.  Neuro exam was nonfocal.  Patient was able to get on and off exam table without assistance.  Gait was normal.  Patient was alerted and oriented.  Attention was good.   Language was appropriate.  Mood was normal without depression.  Speech was not pressured.  Thought content was not tangential.     LABORATORY/RADIOLOGY DATA:  Lab Results  Component Value Date   WBC 4.7 02/05/2012   HGB 12.5 02/05/2012   HCT 37.4 02/05/2012   PLT 204 02/05/2012   GLUCOSE 110* 02/05/2012   CHOL 190 12/08/2010   TRIG 104 12/08/2010   HDL 39* 12/08/2010   LDLDIRECT 111* 02/27/2010   LDLCALC 130* 12/08/2010   ALKPHOS 107 02/05/2012   ALT <6 02/05/2012   AST 10 02/05/2012   NA 144 02/05/2012   K 3.3* 02/05/2012   CL 104 02/05/2012   CREATININE 0.8 02/05/2012   BUN 11.0 02/05/2012   CO2 28 02/05/2012    INR 1.10 10/19/2011    Ct Chest W Contrast  02/05/2012  *RADIOLOGY REPORT*  Clinical Data:  Lung cancer.  Colon cancer.  Restaging.  CT CHEST AND ABDOMEN WITH CONTRAST  Technique:  Multidetector CT imaging of the chest and abdomen was performed following the standard protocol during bolus administration of intravenous contrast. As requested, the pelvis was not included.  Contrast:  OMNIPAQUE IOHEXOL 300 MG/ML  SOLN  Comparison:  Multiple exams, including 10/08/2011 and 09/28/2011.  CT CHEST  Findings:  Hypodense left thyroid lesion, 1.5 x 1.3 cm, not discernibly hypermetabolic on recent PET CT.  Clustered small bilateral axillary lymph nodes are observed.  Amorphous right paratracheal density has ill-defined margins but a short axis measurement of 1.1 cm on image 18 of series 5 (formerly 2.1 cm).  No new adenopathy.  Prior left upper lobectomy noted.  Centrilobular emphysema is present.  Faint underlying nodularity in the lungs is observed. There is slightly reduced solidity of the right upper lobe nodule measuring 0.6 x 1.0 cm on image 14 of series 4.  Adjacent stable ground-glass nodularity measures 1.3 x 0.8 cm.  Neither of these was hypermetabolic on the recent PET CT.  Additional more vague opacities in the right upper lobe appear stable.  IMPRESSION:  1.  Response to therapy with reduced size and reduced marginal conspicuity of the right paratracheal adenopathy.  The adenopathy has not fully resolved. 2.  Emphysema. 3.  Slightly reduced solidity of the more medial ground-glass nodule on image 14 of series 4.  Stable appearance of the more lateral nodule.  These lesions were not hypermetabolic on 09/2011 PET CT.  Observation of these nodules by CT scan is recommended in order to exclude low grade adenocarcinoma. 4.  Coronary artery atherosclerosis.  CT ABDOMEN  Findings:  Mild hepatic steatosis.  Spleen and pancreas unremarkable.  Stable left adrenal fullness without overt mass. Right adrenal gland  normal.  The gallbladder and biliary system appear unremarkable.  Aortoiliac atherosclerotic calcification noted.  Small left kidney upper pole cyst noted; kidneys otherwise unremarkable.  Visualized portion the appendix unremarkable. Prominence of stool throughout the colon suggests constipation. No pathologic retroperitoneal or porta hepatis adenopathy is identified.  IMPRESSION:  1.  Mild hepatic steatosis. 2.  No findings of malignancy in the upper abdomen.   Original Report Authenticated By: Dellia Cloud, M.D.     ASSESSMENT AND PLAN:     1. Recurrent Lung cancer:  - s/p concurrent chemoradiation therapy which finished about 6 weeks ago. She had evidence of partial response. She does have some borderline lymphadenopathy and subcentimeter lung nodules. However this could be residual response in therapy.  I do not advocate any active therapy at this time. She had grade 2 fatigue with chemotherapy and grade 3 cytopenia. Therefore she needs a course of chemotherapy holiday. I will repeat clinical exam in about 3 months and repeat CT of the chest about 6 months. If she develops obvious active evidence of disease progression, we may consider salvage chemotherapy with carboplatin Alimta.  I contacted Pathology today to add on EGRD and EML/ALK mutations in case she has recurrent/meastatic disease in the future.  She does not clearly have metastatic at this time to warrant maintenance therapy.  2. History of colon cancer: She has no evidence of recurrent or metastatic disease for her colon cancer. Surveillance  for her history of colon cancer entails once-a-year CT scan for 5 years' total, with the next CT abdomen in Sep 2014 since her CT abdomen/pelvis this time was negative. She had a surveillance colonoscopy in October 2012 by Dr Christella Hartigan, which showed polyps. Per his recommendation, the next colonoscopy is due in Oct 2015 but sooner if she has symptoms.  3. Smoking: only on smokeless cigarettes now.   4. Hypertension: She is on nebivolol, Catapres, HCTZ and clonidine per PCP with good control.  5. Hypercholesterolemia: She  is on pravastatin per PCP.  7. Anorexia: Due to new diagnosis of recurrent lung cancer. Her weight is slightly improved being off of chemo. 8. Mild Calorie/protein malnutrition: improved off of chemo.   9. Leukopenia without neutropenia: from chemo.  Resolved off of chemo.  10. Follow up: in about 3 months for clinical exam; and 6 months visit with CT chest the day prior.       The length of time of the face-to-face encounter was 25 minutes. More than 50% of time was spent counseling and coordination of care.

## 2012-02-08 NOTE — Patient Instructions (Addendum)
A. Diagnosis:  History of lung and colon cancer. B. Treatment:  Recently finished chemoradiation for lung cancer.   C.  CT result   CT CHEST   Findings: Hypodense left thyroid lesion, 1.5 x 1.3 cm, not  discernibly hypermetabolic on recent PET CT.  Clustered small bilateral axillary lymph nodes are observed.  Amorphous right paratracheal density has ill-defined margins but a  short axis measurement of 1.1 cm on image 18 of series 5 (formerly  2.1 cm). No new adenopathy.  Prior left upper lobectomy noted. Centrilobular emphysema is  present. Faint underlying nodularity in the lungs is observed.  There is slightly reduced solidity of the right upper lobe nodule  measuring 0.6 x 1.0 cm on image 14 of series 4. Adjacent stable  ground-glass nodularity measures 1.3 x 0.8 cm. Neither of these  was hypermetabolic on the recent PET CT. Additional more vague  opacities in the right upper lobe appear stable.   IMPRESSION:   1. Response to therapy with reduced size and reduced marginal  conspicuity of the right paratracheal adenopathy. The adenopathy  has not fully resolved.  2. Emphysema.  3. Slightly reduced solidity of the more medial ground-glass  nodule on image 14 of series 4. Stable appearance of the more  lateral nodule. These lesions were not hypermetabolic on 09/2011  PET CT. Observation of these nodules by CT scan is recommended in  order to exclude low grade adenocarcinoma.  4. Coronary artery atherosclerosis.   CT ABDOMEN   Findings: Mild hepatic steatosis. Spleen and pancreas  unremarkable. Stable left adrenal fullness without overt mass.  Right adrenal gland normal.  The gallbladder and biliary system appear unremarkable.  Aortoiliac atherosclerotic calcification noted. Small left kidney  upper pole cyst noted; kidneys otherwise unremarkable.  Visualized portion the appendix unremarkable. Prominence of stool  throughout the colon suggests constipation. No pathologic    retroperitoneal or porta hepatis adenopathy is identified.   IMPRESSION:   1. Mild hepatic steatosis.  2. No findings of malignancy in the upper abdomen.  D.  Follow up:  In 3 months for clinical visit.  Repeat CT chest in about 6 months the day before return visit with Dr. Gaylyn Rong.

## 2012-02-15 ENCOUNTER — Emergency Department (HOSPITAL_COMMUNITY): Payer: Medicare Other

## 2012-02-15 ENCOUNTER — Other Ambulatory Visit: Payer: Self-pay | Admitting: Oncology

## 2012-02-15 ENCOUNTER — Encounter (HOSPITAL_COMMUNITY): Payer: Self-pay | Admitting: *Deleted

## 2012-02-15 ENCOUNTER — Emergency Department (HOSPITAL_COMMUNITY)
Admission: EM | Admit: 2012-02-15 | Discharge: 2012-02-15 | Disposition: A | Discharge status: Home / Self Care | Payer: Medicare Other | Attending: Emergency Medicine | Admitting: Emergency Medicine

## 2012-02-15 DIAGNOSIS — G939 Disorder of brain, unspecified: Secondary | ICD-10-CM | POA: Insufficient documentation

## 2012-02-15 DIAGNOSIS — R220 Localized swelling, mass and lump, head: Secondary | ICD-10-CM | POA: Insufficient documentation

## 2012-02-15 DIAGNOSIS — F17200 Nicotine dependence, unspecified, uncomplicated: Secondary | ICD-10-CM | POA: Insufficient documentation

## 2012-02-15 DIAGNOSIS — I10 Essential (primary) hypertension: Secondary | ICD-10-CM | POA: Insufficient documentation

## 2012-02-15 DIAGNOSIS — Z85118 Personal history of other malignant neoplasm of bronchus and lung: Secondary | ICD-10-CM | POA: Insufficient documentation

## 2012-02-15 DIAGNOSIS — R51 Headache: Secondary | ICD-10-CM

## 2012-02-15 DIAGNOSIS — C7931 Secondary malignant neoplasm of brain: Secondary | ICD-10-CM

## 2012-02-15 DIAGNOSIS — G9389 Other specified disorders of brain: Secondary | ICD-10-CM

## 2012-02-15 HISTORY — DX: Secondary malignant neoplasm of brain: C79.31

## 2012-02-15 LAB — CBC WITH DIFFERENTIAL/PLATELET
Eosinophils Absolute: 0.1 10*3/uL (ref 0.0–0.7)
HCT: 38.1 % (ref 36.0–46.0)
Hemoglobin: 12.4 g/dL (ref 12.0–15.0)
Lymphs Abs: 0.8 10*3/uL (ref 0.7–4.0)
MCH: 26.3 pg (ref 26.0–34.0)
Monocytes Relative: 8 % (ref 3–12)
Neutro Abs: 2.8 10*3/uL (ref 1.7–7.7)
Neutrophils Relative %: 70 % (ref 43–77)
RBC: 4.71 MIL/uL (ref 3.87–5.11)

## 2012-02-15 LAB — COMPREHENSIVE METABOLIC PANEL
Alkaline Phosphatase: 119 U/L — ABNORMAL HIGH (ref 39–117)
BUN: 10 mg/dL (ref 6–23)
Chloride: 102 mEq/L (ref 96–112)
GFR calc Af Amer: >90 mL/min (ref >90)
GFR calc non Af Amer: >90 mL/min (ref >90)
Glucose, Bld: 104 mg/dL — ABNORMAL HIGH (ref 70–99)
Potassium: 3.1 mEq/L — ABNORMAL LOW (ref 3.5–5.1)
Total Bilirubin: 0.4 mg/dL (ref 0.3–1.2)

## 2012-02-15 MED ORDER — OXYCODONE-ACETAMINOPHEN 5-325 MG PO TABS: 1.0000 | ORAL_TABLET | Freq: Four times a day (QID) | ORAL | Status: DC | PRN

## 2012-02-15 MED ORDER — HYDROMORPHONE HCL PF 1 MG/ML IJ SOLN
1.0000 mg | Freq: Once | INTRAMUSCULAR | Status: AC
  Administered 2012-02-15: 1 mg via INTRAVENOUS
  Filled 2012-02-15: qty 1

## 2012-02-15 MED ORDER — ONDANSETRON HCL 4 MG/2ML IJ SOLN
4.0000 mg | Freq: Once | INTRAMUSCULAR | Status: AC
  Administered 2012-02-15: 4 mg via INTRAVENOUS
  Filled 2012-02-15: qty 2

## 2012-02-15 MED ORDER — DEXAMETHASONE 4 MG PO TABS: 8.0000 mg | ORAL_TABLET | Freq: Two times a day (BID) | ORAL | Status: DC

## 2012-02-15 MED ORDER — ONDANSETRON HCL 4 MG/2ML IJ SOLN
INTRAMUSCULAR | Status: AC
  Filled 2012-02-15: qty 2

## 2012-02-15 MED ORDER — DEXAMETHASONE SODIUM PHOSPHATE 10 MG/ML IJ SOLN
10.0000 mg | Freq: Once | INTRAMUSCULAR | Status: AC
  Administered 2012-02-15: 10 mg via INTRAVENOUS
  Filled 2012-02-15: qty 1

## 2012-02-15 MED ORDER — KETOROLAC TROMETHAMINE 30 MG/ML IJ SOLN
30.0000 mg | Freq: Once | INTRAMUSCULAR | Status: AC
  Administered 2012-02-15: 30 mg via INTRAVENOUS
  Filled 2012-02-15: qty 1

## 2012-02-15 MED ORDER — GADOBENATE DIMEGLUMINE 529 MG/ML IV SOLN
15.0000 mL | Freq: Once | INTRAVENOUS | Status: AC
  Administered 2012-02-15: 15 mL via INTRAVENOUS

## 2012-02-15 NOTE — ED Notes (Addendum)
Patient states left sided headache x 4 days with increasing pain today, patient states pain radiating down into neck, patient states vision become blurry this am when pain increased, patient with history of cerebral aneurysm

## 2012-02-15 NOTE — ED Provider Notes (Signed)
History     CSN: [id number]  Arrival date & time 02/15/12  1117   First MD Initiated Contact with Patient 02/15/12 1128      Chief Complaint  Patient presents with  . Headache    (Consider location/radiation/quality/duration/timing/severity/associated sxs/prior treatment) HPI Comments: The patient presents here with complaints of headache.  She reports some visual complaints.  She reports a history of a cerebral aneurysm.  She also has a pmh of lung and colon cancer in the past treated with chemo and radiation.  Patient is a 61 y.o. female presenting with headaches. The history is provided by the patient.  Headache  This is a new problem. Episode onset: 4 days ago. The problem occurs constantly. The problem has been gradually worsening. The headache is associated with nothing. The pain is located in the left unilateral region. The quality of the pain is described as dull and throbbing. The pain is severe. The pain does not radiate. Associated symptoms include malaise/fatigue. Pertinent negatives include no fever, no shortness of breath, no nausea and no vomiting. She has tried nothing ("take too many medications already") for the symptoms.    Past Medical History  Diagnosis Date  . Depression   . Hyperlipidemia   . Hypertension   . Colon cancer   . Lung cancer     PET scan 04/28/2010; primary: increase in size 02/2010  . Lung nodule     FNA ordered for 04/02/10 by HA>pos Ca  . Chronic folliculitis     of groin  . Fibrocystic breast changes   . Family history of trichomonal vaginitis 05/2005  . Postmenopausal   . Depressive disorder   . Hypercholesterolemia   . GERD (gastroesophageal reflux disease)   . Cerebral aneurysm   . Back pain   . Shortness of breath   . COPD (chronic obstructive pulmonary disease)   . Coronary artery disease   . Arthritis   . Weight loss 10/22/2011    Past Surgical History  Procedure Date  . Colon surgery   . Tubal ligation   . Lung lobectomy  2012  . Hernia repair   . Breast surgery     Bil lumpectomy  . Back surgery   . Mediastinoscopy 10/19/2011    Procedure: MEDIASTINOSCOPY;  Surgeon: Alleen Borne, MD;  Location: [facility] OR;  Service: Thoracic;  Laterality: N/A;  . Cardiac cath x3     Family History  Problem Relation Age of Onset  . Stomach cancer Maternal Aunt   . Breast cancer Cousin   . Cancer Sister     Lymphatic  . Osteoarthritis Father   . Gout Father   . Hypertension Father   . Heart disease Mother     pericarditis;   . Anesthesia problems Neg Hx     History  Substance Use Topics  . Smoking status: Current Every Day Smoker -- 1.5 packs/day    Types: Cigarettes  . Smokeless tobacco: Never Used  . Alcohol Use: 0.0 oz/week     occasional    OB History    Grav Para Term Preterm Abortions TAB SAB Ect Mult Living                  Review of Systems  Constitutional: Positive for malaise/fatigue. Negative for fever.  Respiratory: Negative for shortness of breath.   Gastrointestinal: Negative for nausea and vomiting.  Neurological: Positive for headaches.  All other systems reviewed and are negative.    Allergies  Lisinopril  Home  Medications   Current Outpatient Rx  Name Route Sig Dispense Refill  . ALBUTEROL SULFATE HFA 108 (90 BASE) MCG/ACT IN AERS Inhalation Inhale 2 puffs into the lungs every 6 (six) hours as needed.    . ASPIRIN 81 MG PO TBEC Oral Take 81 mg by mouth daily.    Marland Kitchen VITAMIN D 1000 UNITS PO TABS Oral Take 1,000 Units by mouth daily.    Marland Kitchen CLONIDINE HCL 0.1 MG PO TABS Oral Take 0.1 mg by mouth at bedtime.    Marland Kitchen DICLOFENAC SODIUM 1 % TD GEL Topical Apply 1 application topically 4 (four) times daily. As needed for pain    . EMOLLIENT BASE EX CREA Topical Apply topically as needed.    . OMEGA-3 FATTY ACIDS 1000 MG PO CAPS Oral Take 2,400 g by mouth daily. Also contains FLAX BORAGE    . HYDROCHLOROTHIAZIDE 25 MG PO TABS Oral Take 25 mg by mouth daily.    Marland Kitchen HYDROCORTISONE 0.5 % EX CREA   2 (two) times daily.    Marland Kitchen HYDROXYZINE HCL 10 MG PO TABS      . NEBIVOLOL HCL 10 MG PO TABS Oral Take 10 mg by mouth daily.    Marland Kitchen NITROGLYCERIN 0.4 MG SL SUBL Sublingual Place 0.4 mg under the tongue every 5 (five) minutes as needed.    Marland Kitchen OMEPRAZOLE 20 MG PO CPDR Oral Take 20 mg by mouth daily.      Marland Kitchen PRAVASTATIN SODIUM 40 MG PO TABS Oral Take 80 mg by mouth daily.    Marland Kitchen SPIRULINA POWD Does not apply by Does not apply route daily.    . TRAMADOL HCL 50 MG PO TABS Oral Take 50 mg by mouth every 6 (six) hours as needed. As needed for pain.    Marland Kitchen ZOLPIDEM TARTRATE 5 MG PO TABS Oral Take 5 mg by mouth at bedtime as needed.      BP 138/72  Pulse 64  Temp 97.5 F (36.4 C) (Oral)  Resp 20  SpO2 96%  Physical Exam  Nursing note and vitals reviewed. Constitutional: She is oriented to person, place, and time. She appears well-developed and well-nourished. No distress.       The patient appears uncomfortable.    HENT:  Head: Normocephalic and atraumatic.  Neck: Normal range of motion. Neck supple.  Cardiovascular: Normal rate and regular rhythm.  Exam reveals no gallop and no friction rub.   No murmur heard. Pulmonary/Chest: Effort normal and breath sounds normal. No respiratory distress. She has no wheezes.  Abdominal: Soft. Bowel sounds are normal. She exhibits no distension. There is no tenderness.  Musculoskeletal: Normal range of motion.  Neurological: She is alert and oriented to person, place, and time. No cranial nerve deficit. She exhibits normal muscle tone. Coordination normal.  Skin: Skin is warm and dry. She is not diaphoretic.    ED Course  Procedures (including critical care time)   Labs Reviewed  CBC WITH DIFFERENTIAL  COMPREHENSIVE METABOLIC PANEL   No results found.   No diagnosis found.    MDM  The patient presents here with complaints of headache and a history of lung and colon cancer.  She was treated with pain and nausea meds and is feeling much better.  The ct  performed showed a possible abnormality in the left occipital lobe which was followed up with an mri.  This revealed a 15 mm mass along with a 5 mm lesion in the insula consistent with early metastatic disease.  I have spoken with Dr. Arlice Colt regarding this patient.  He recommends decadron and follow up in the office in the near future.  They will call her tomorrow to make these arrangements.          Geoffery Lyons, MD 02/15/12 1536

## 2012-02-16 ENCOUNTER — Other Ambulatory Visit: Payer: Self-pay | Admitting: Radiation Therapy

## 2012-02-16 ENCOUNTER — Telehealth: Payer: Self-pay | Admitting: Oncology

## 2012-02-16 DIAGNOSIS — C7949 Secondary malignant neoplasm of other parts of nervous system: Secondary | ICD-10-CM

## 2012-02-16 NOTE — Telephone Encounter (Signed)
S/w the pt and she is aware of her appts on 02/19/2012@9 :00am

## 2012-02-16 NOTE — Progress Notes (Signed)
Received Pathology report from [facility]; forwarded to Dr. Gaylyn Rong.

## 2012-02-19 ENCOUNTER — Other Ambulatory Visit (HOSPITAL_BASED_OUTPATIENT_CLINIC_OR_DEPARTMENT_OTHER): Payer: Medicare Other | Admitting: Lab

## 2012-02-19 ENCOUNTER — Telehealth: Payer: Self-pay | Admitting: Oncology

## 2012-02-19 ENCOUNTER — Ambulatory Visit (HOSPITAL_BASED_OUTPATIENT_CLINIC_OR_DEPARTMENT_OTHER): Payer: Medicare Other | Admitting: Oncology

## 2012-02-19 VITALS — BP 149/82 | HR 75 | Temp 97.0°F | Resp 20 | Ht 64.0 in | Wt 165.4 lb

## 2012-02-19 DIAGNOSIS — C3490 Malignant neoplasm of unspecified part of unspecified bronchus or lung: Secondary | ICD-10-CM

## 2012-02-19 DIAGNOSIS — C7931 Secondary malignant neoplasm of brain: Secondary | ICD-10-CM

## 2012-02-19 DIAGNOSIS — C3410 Malignant neoplasm of upper lobe, unspecified bronchus or lung: Secondary | ICD-10-CM

## 2012-02-19 DIAGNOSIS — C187 Malignant neoplasm of sigmoid colon: Secondary | ICD-10-CM

## 2012-02-19 LAB — CBC WITH DIFFERENTIAL/PLATELET
BASO%: 0.1 % (ref 0.0–2.0)
Basophils Absolute: 0 10*3/uL (ref 0.0–0.1)
EOS%: 0.2 % (ref 0.0–7.0)
Eosinophils Absolute: 0 10*3/uL (ref 0.0–0.5)
HCT: 41 % (ref 34.8–46.6)
HGB: 13.5 g/dL (ref 11.6–15.9)
LYMPH%: 5.5 % — ABNORMAL LOW (ref 14.0–49.7)
MCH: 26.2 pg (ref 25.1–34.0)
MCHC: 32.8 g/dL (ref 31.5–36.0)
MCV: 79.9 fL (ref 79.5–101.0)
MONO#: 0.7 10*3/uL (ref 0.1–0.9)
MONO%: 6.1 % (ref 0.0–14.0)
NEUT#: 10.6 10*3/uL — ABNORMAL HIGH (ref 1.5–6.5)
NEUT%: 88.1 % — ABNORMAL HIGH (ref 38.4–76.8)
Platelets: 261 10*3/uL (ref 145–400)
RBC: 5.13 10*6/uL (ref 3.70–5.45)
RDW: 19.8 % — ABNORMAL HIGH (ref 11.2–14.5)
WBC: 12 10*3/uL — ABNORMAL HIGH (ref 3.9–10.3)
lymph#: 0.7 10*3/uL — ABNORMAL LOW (ref 0.9–3.3)

## 2012-02-19 LAB — BASIC METABOLIC PANEL (CC13)
CO2: 26 mEq/L (ref 22–29)
Calcium: 9.7 mg/dL (ref 8.4–10.4)
Chloride: 105 mEq/L (ref 98–107)
Sodium: 142 mEq/L (ref 136–145)

## 2012-02-19 MED ORDER — INFLUENZA VIRUS VACC SPLIT PF IM SUSP
0.5000 mL | Freq: Once | INTRAMUSCULAR | Status: AC
  Administered 2012-02-19: 0.5 mL via INTRAMUSCULAR
  Filled 2012-02-19: qty 0.5

## 2012-02-19 MED ORDER — LEVETIRACETAM 500 MG PO TABS: 500.0000 mg | ORAL_TABLET | Freq: Two times a day (BID) | ORAL | Status: DC

## 2012-02-19 MED ORDER — FOLIC ACID 1 MG PO TABS: 1.0000 mg | ORAL_TABLET | Freq: Every day | ORAL | Status: DC

## 2012-02-19 MED ORDER — CYANOCOBALAMIN 1000 MCG/ML IJ SOLN
1000.0000 ug | INTRAMUSCULAR | Status: DC
  Administered 2012-02-19: 1000 ug via INTRAMUSCULAR

## 2012-02-19 NOTE — Progress Notes (Signed)
[facility]  [phone] [phone]   OFFICE PROGRESS NOTE   Cc:  CHAMBERLAIN,RACHEL, MD  PAST DIAGNOSIS AND PAST THERAPY:  1. 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 Her post resection surveillance colonoscopy onOctober 14, 2009, by Dr. Christella Hartigan was negative. 2. pT1a, pN0 well differentiated adenocarcinoma of lung; s/p left upper lobectomy on 06/16/2010. 3. 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  4. 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    CURRENT THERAPY:  Pending radiation and start of salvage chemo.  INTERVAL HISTORY: Lauren [name] 61 y.o. female returns for regular follow up with her daughter.  She developed head ache late September 2013 after seeing me.  On 02/15/12, the headache was severe with visual chanes.  CT and MRI brain at ED showed brain mets.  She was placed on Dexamethasone 8mg  PO BID and followed up with NeuroSurg and Rad Onc.   She reports that since Dex, her headache has ease up. Her diplopia has resolved.  It is in the occipital area and bilateral temporal areas.  She denied confusion, fever, seizure, cough, SOB, CP, abd pain, bleeding, focal motor weakness.  She has mild fatigue; however, she is still independent of activities of daily living and able to live by herself.   Past Medical History  Diagnosis Date  . Depression   . Hyperlipidemia   . Hypertension   . Colon cancer   . Lung cancer     PET scan 04/28/2010; primary: increase in size 02/2010  . Lung nodule     FNA ordered for 04/02/10 by HA>pos Ca  . Chronic folliculitis     of groin  . Fibrocystic breast changes   . Family history of trichomonal  vaginitis 05/2005  . Postmenopausal   . Depressive disorder   . Hypercholesterolemia   . GERD (gastroesophageal reflux disease)   . Cerebral aneurysm   . Back pain   . Shortness of breath   . COPD (chronic obstructive pulmonary disease)   . Coronary artery disease   . Arthritis   . Weight loss 10/22/2011    Past Surgical History  Procedure Date  . Colon surgery   . Tubal ligation   . Lung lobectomy 2012  . Hernia repair   . Breast surgery     Bil lumpectomy  . Back surgery   . Mediastinoscopy 10/19/2011    Procedure: MEDIASTINOSCOPY;  Surgeon: Alleen Borne, MD;  Location: [facility] OR;  Service: Thoracic;  Laterality: N/A;  . Cardiac cath x3     Current Outpatient Prescriptions  Medication Sig Dispense Refill  . albuterol (PROVENTIL HFA;VENTOLIN HFA) 108 (90 BASE) MCG/ACT inhaler Inhale 2 puffs into the lungs every 6 (six) hours as needed. For shortness of breath      . aspirin 81 MG EC tablet Take 81 mg by mouth daily.      . cholecalciferol (VITAMIN D) 1000 UNITS tablet Take 1,000 Units by mouth daily.      . cloNIDine (CATAPRES) 0.1 MG tablet Take 0.1 mg by mouth at bedtime.      Marland Kitchen dexamethasone (DECADRON) 4 MG tablet Take 2 tablets (8 mg total) by mouth 2 (two) times daily with a meal.  30 tablet  0  .  diclofenac sodium (VOLTAREN) 1 % GEL Apply 1 application topically 4 (four) times daily. As needed for pain      . hydrochlorothiazide (HYDRODIURIL) 25 MG tablet Take 25 mg by mouth daily.      . hydrOXYzine (ATARAX/VISTARIL) 10 MG tablet Take 10 mg by mouth daily as needed. For itching      . nebivolol (BYSTOLIC) 10 MG tablet Take 10 mg by mouth daily.      . nitroGLYCERIN (NITROSTAT) 0.4 MG SL tablet Place 0.4 mg under the tongue every 5 (five) minutes as needed. For chest pain      . omeprazole (PRILOSEC) 20 MG capsule Take 20 mg by mouth daily.        . pravastatin (PRAVACHOL) 40 MG tablet Take 40 mg by mouth daily.       . traMADol (ULTRAM) 50 MG tablet Take 50 mg by mouth  every 6 (six) hours as needed. As needed for pain.      Marland Kitchen levETIRAcetam (KEPPRA) 500 MG tablet Take 1 tablet (500 mg total) by mouth every 12 (twelve) hours.  60 tablet  2  . oxyCODONE-acetaminophen (PERCOCET/ROXICET) 5-325 MG per tablet Take 1-2 tablets by mouth every 6 (six) hours as needed for pain.  20 tablet  0  . DISCONTD: buPROPion (WELLBUTRIN XL) 150 MG 24 hr tablet Take 1 tablet (150 mg total) by mouth 2 (two) times daily.  60 tablet  5  . DISCONTD: levETIRAcetam (KEPPRA) 500 MG tablet Take 1 tablet (500 mg total) by mouth every 12 (twelve) hours.  60 tablet  2   Current Facility-Administered Medications  Medication Dose Route Frequency Provider Last Rate Last Dose  . cyanocobalamin ((VITAMIN B-12)) injection 1,000 mcg  1,000 mcg Intramuscular Q30 days Exie Parody, MD   1,000 mcg at 02/19/12 1046  . influenza  inactive virus vaccine (FLUZONE/FLUARIX) injection 0.5 mL  0.5 mL Intramuscular Once Exie Parody, MD   0.5 mL at 02/19/12 1049    ALLERGIES:  is allergic to lisinopril.  REVIEW OF SYSTEMS:  The rest of the 14-point review of system was negative.   Filed Vitals:   02/19/12 0939  BP: 149/82  Pulse: 75  Temp: 97 F (36.1 C)  Resp: 20   Wt Readings from Last 3 Encounters:  02/19/12 165 lb 6.4 oz (75.025 kg)  02/08/12 165 lb 9.6 oz (75.116 kg)  02/04/12 164 lb (74.39 kg)   ECOG Performance status: 1  PHYSICAL EXAMINATION:   General:  well-nourished in no acute distress.  Eyes:  no scleral icterus.  ENT:  There were no oropharyngeal lesions.  Neck was without thyromegaly.  Lymphatics:  Negative cervical, supraclavicular or axillary adenopathy.  Respiratory: lungs were clear bilaterally without wheezing or crackles.  Cardiovascular:  Regular rate and rhythm, S1/S2, without murmur, rub or gallop.  There was no pedal edema.  GI:  abdomen was soft, flat, nontender, nondistended, without organomegaly.  Muscoloskeletal:  no spinal tenderness of palpation of vertebral spine.  Skin exam  was without echymosis, petichae.  Neuro exam was nonfocal.  EOM was intact.  There was no dysmetria.  Patient was able to get on and off exam table without assistance.  Gait was normal.  Patient was alerted and oriented.  Attention was good.   Language was appropriate.  Mood was normal without depression.  Speech was not pressured.  Thought content was not tangential.         LABORATORY/RADIOLOGY DATA:  Lab Results  Component Value Date  WBC 12.0* 02/19/2012   HGB 13.5 02/19/2012   HCT 41.0 02/19/2012   PLT 261 02/19/2012   GLUCOSE 122* 02/19/2012   CHOL 190 12/08/2010   TRIG 104 12/08/2010   HDL 39* 12/08/2010   LDLDIRECT 111* 02/27/2010   LDLCALC 130* 12/08/2010   ALKPHOS 119* 02/15/2012   ALT 5 02/15/2012   AST 12 02/15/2012   NA 142 02/19/2012   K 3.5 02/19/2012   CL 105 02/19/2012   CREATININE 0.8 02/19/2012   BUN 18.0 02/19/2012   CO2 26 02/19/2012   INR 1.10 10/19/2011    IMAGING:  I personally reviewed the following studies and showed the images to the patient and her daughter.   Ct Head Wo Contrast  02/15/2012  *RADIOLOGY REPORT*  Clinical Data: Headache.  CT HEAD WITHOUT CONTRAST  Technique:  Contiguous axial images were obtained from the base of the skull through the vertex without contrast.  Comparison: None.  Findings: Bony calvarium is intact.  No midline shift is noted. Ventricular size is within normal limits.  No hemorrhage is noted. There is noted ill-defined low density involving the subcortical white matter of the left occipital lobe concerning for edema.  It is uncertain if this represents infarction or edema secondary to underlying neoplasm given the history of lung cancer.  IMPRESSION: Left occipital low density is noted which may represent either early infarction or white matter edema secondary to underlying neoplasm or metastatic disease.  Further evaluation with CT scan or MRI scan with contrast administration is recommended.   Original Report Authenticated By: Venita Sheffield., M.D.    Mr Lauren [name] Wo Contrast  02/15/2012  *RADIOLOGY REPORT*  Clinical Data: 61 year old female with severe headache and photophobia.  History of lung cancer.  Abnormal head CT earlier today.  MRI HEAD WITHOUT AND WITH CONTRAST  Technique:  Multiplanar, multiecho pulse sequences of the brain and surrounding structures were obtained according to standard protocol without and with intravenous contrast  Contrast: 15mL MULTIHANCE GADOBENATE DIMEGLUMINE 529 MG/ML IV SOLN  Comparison: Head CT 02/15/2012.  Brain MRI 10/27/2011.  Findings: There is a left occipital rim enhancing mass measuring 15 mm diameter corresponding to the area of cerebral edema identified earlier today.  There is evidence of trace hemorrhage (series 5 image 12).  Surrounding edema with only mild mass effect/mild effacement of the left occipital horn.  There is a second 5 mm solidly enhancing lesion at the anterior right insula/operculum (series 10 image 33).  No associated edema or mass effect at that site.  No additional enhancing lesion is identified.  No midline shift. No restricted diffusion to suggest acute infarction.  No ventriculomegaly.  No other acute intracranial hemorrhage or mass effect. Major intracranial vascular flow voids are preserved. Visualized bone marrow signal remains normal.  Stable visualized cervical spine.  Negative pituitary and cervicomedullary junction. Visualized orbit soft tissues are within normal limits.  Stable paranasal sinuses.  IMPRESSION: Early intracranial metastatic disease; rim enhancing 15 mm mass in the left occipital pole corresponding to the area of edema seen earlier, and enhancing 5 mm right insula metastasis without mass effect or edema.   Original Report Authenticated By: Harley Hallmark, M.D.        ASSESSMENT AND PLAN:   1. Recurrent Lung cancer now with brain med:  -Agree with NeuroSurg and Rad onc for gamma knife due to start on 03/01/2012. - Once she finishes gamma knife,  I strongly recommend maintenance Alima 100mg /m2 IV q3 weeks indefinitely  until poor toleration or disease progression.  She has no disease outside of the brain, however, the rapid pace of brain med since 10/2011 to 01/2012 raises a high concern for me that she will rapid develop visceral mets.  Randomized data supported the use of maintenance chemo Alimta to decrease risk of disease progression and possible survival.  We discussed potential side effects of Alimta which include but not limited to fatigue, hair loss, mucositis, cytopenia, infection, bleeding, skin rash.  She expressed informed understanding and wished to proceed with Alimta chemo after radiation.   - In preparation for Alimta, she received influenza vaccine, VitB12 injection and started on Folate today.   - For brain met, I advised her to change Dexamethasone from 8mg  PO BID to 4mg  PO q6 hours for better efficacy.  After gamma knife, I will taper this off.  I also advised her to start Keppra 500mg  PO BID to decrease risk of seizure.   2. History of colon cancer: She has no evidence of recurrent or metastatic disease for her colon cancer. Surveillance  for her history of colon cancer entails once-a-year CT scan for 5 years' total, with the next CT abdomen in Sep 2014 since her CT abdomen/pelvis this time was negative. She had a surveillance colonoscopy in October 2012 by Dr Christella Hartigan, which showed polyps. Per his recommendation, the next colonoscopy is due in Oct 2015 but sooner if she has symptoms.  3. Smoking: only on smokeless cigarettes now.  4. Hypertension: She is on nebivolol, Catapres, HCTZ and per PCP.  5. Hypercholesterolemia: She is on pravastatin per PCP.  7. Anorexia: Due to new diagnosis of recurrent lung cancer. Her weight is slightly improved being off of chemo.  8. Follow up: in about 2 weeks to reassess post radiation and to start chemo.  9.  CODE STATUS:  I discussed with patient and her daughter the incurable nature of her  stage IV NSCLCA.  She is leaning towards DNR/DNI.  However, she will need to discuss this with her children.  For now, she would like to remain FULL CODE.  We need to discuss this again in the future.     The length of time of the face-to-face encounter was 40  minutes. More than 50% of time was spent counseling and coordination of care.

## 2012-02-19 NOTE — Telephone Encounter (Signed)
Gave pt appt for lab, MD, and chemo for October and November 2013

## 2012-02-19 NOTE — Patient Instructions (Addendum)
1.  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 2.  Therapy:  Radiation to the brain lesions. 3.  Once radiation is finished, I strongly recommend maintenance chemo:  Alimta IV once every 3 weeks.  The potential benefit is to delay the recurrence of metastatic cancer.    4.  potential side effects of Alimta include but not limited to hair loss, fatigue, mouth sore, skin rash, low blood count, infection, bleeding.  Patients need to take Folate 1mg  by mouth once daily; and Vit B12 injection every 6-9 weeks to decrease the risk of low blood count on Alimta chemo.  I recommend to start Folate at home now, and Vit B12 injection here today.  5.  Follow up:  With Dr. Gaylyn Rong after radiation is over.

## 2012-02-24 ENCOUNTER — Ambulatory Visit: Payer: Medicare Other

## 2012-02-24 ENCOUNTER — Ambulatory Visit: Payer: Medicare Other | Admitting: Radiation Oncology

## 2012-02-24 ENCOUNTER — Other Ambulatory Visit: Payer: Self-pay | Admitting: Radiation Oncology

## 2012-02-24 ENCOUNTER — Encounter: Payer: Self-pay | Admitting: Radiation Oncology

## 2012-02-25 ENCOUNTER — Ambulatory Visit
Admission: RE | Admit: 2012-02-25 | Discharge: 2012-02-25 | Disposition: A | Discharge status: Home / Self Care | Payer: Medicare Other | Source: Ambulatory Visit | Attending: Radiation Oncology | Admitting: Radiation Oncology

## 2012-02-25 ENCOUNTER — Other Ambulatory Visit: Payer: Self-pay | Admitting: Radiation Therapy

## 2012-02-25 DIAGNOSIS — C7931 Secondary malignant neoplasm of brain: Secondary | ICD-10-CM

## 2012-02-25 MED ORDER — GADOBENATE DIMEGLUMINE 529 MG/ML IV SOLN
15.0000 mL | Freq: Once | INTRAVENOUS | Status: AC | PRN
  Administered 2012-02-25: 15 mL via INTRAVENOUS

## 2012-02-26 ENCOUNTER — Encounter: Payer: Self-pay | Admitting: Radiation Oncology

## 2012-02-26 ENCOUNTER — Ambulatory Visit
Admission: RE | Admit: 2012-02-26 | Discharge: 2012-02-26 | Disposition: A | Discharge status: Home / Self Care | Payer: Medicare Other | Source: Ambulatory Visit | Attending: Radiation Oncology | Admitting: Radiation Oncology

## 2012-02-26 ENCOUNTER — Ambulatory Visit: Payer: Medicare Other

## 2012-02-26 ENCOUNTER — Other Ambulatory Visit: Payer: Self-pay | Admitting: Radiation Oncology

## 2012-02-26 ENCOUNTER — Ambulatory Visit
Admission: RE | Admit: 2012-02-26 | Discharge: 2012-02-26 | Payer: Medicare Other | Source: Ambulatory Visit | Attending: Radiation Oncology | Admitting: Radiation Oncology

## 2012-02-26 VITALS — BP 120/75 | HR 73 | Temp 98.3°F | Resp 18 | Wt 167.4 lb

## 2012-02-26 DIAGNOSIS — C7931 Secondary malignant neoplasm of brain: Secondary | ICD-10-CM

## 2012-02-26 DIAGNOSIS — C3490 Malignant neoplasm of unspecified part of unspecified bronchus or lung: Secondary | ICD-10-CM

## 2012-02-26 DIAGNOSIS — C7949 Secondary malignant neoplasm of other parts of nervous system: Secondary | ICD-10-CM

## 2012-02-26 DIAGNOSIS — C189 Malignant neoplasm of colon, unspecified: Secondary | ICD-10-CM

## 2012-02-26 HISTORY — DX: Secondary malignant neoplasm of brain: C79.31

## 2012-02-26 HISTORY — DX: Personal history of irradiation: Z92.3

## 2012-02-26 HISTORY — DX: Personal history of antineoplastic chemotherapy: Z92.21

## 2012-02-26 MED ORDER — SODIUM CHLORIDE 0.9 % IJ SOLN
10.0000 mL | Freq: Once | INTRAMUSCULAR | Status: AC
  Administered 2012-02-26: 10 mL via INTRAVENOUS

## 2012-02-26 MED ORDER — FLUCONAZOLE 100 MG PO TABS: 100.0000 mg | ORAL_TABLET | Freq: Every day | ORAL | Status: DC

## 2012-02-26 MED ORDER — DEXAMETHASONE 4 MG PO TABS: 4.0000 mg | ORAL_TABLET | Freq: Four times a day (QID) | ORAL | Status: DC

## 2012-02-26 NOTE — Progress Notes (Signed)
  Radiation Oncology         [phone] ________________________________  Name: GALI SPINNEY MRN: [id number]  Date: 02/26/2012  DOB: 1950/08/12  SIMULATION AND TREATMENT PLANNING NOTE  DIAGNOSIS:  metastatic lung cancer to the brain  NARRATIVE:  The patient was brought to the CT Simulation planning suite.  Identity was confirmed.  All relevant records and images related to the planned course of therapy were reviewed.  The patient freely provided informed written consent to proceed with treatment after reviewing the details related to the planned course of therapy. The consent form was witnessed and verified by the simulation staff. Intravenous access was established for contrast administration. Then, the patient was set-up in a stable reproducible supine position for radiation therapy.  A relocatable thermoplastic stereotactic head frame was fabricated for precise immobilization.  CT images were obtained.  Surface markings were placed.  The CT images were loaded into the planning software and fused with the patient's targeting MRI scan.  Then the target and avoidance structures were contoured.  Treatment planning then occurred.  The radiation prescription was entered and confirmed.  I have requested 3D planning  I have requested a DVH of the following structures: Brain stem, brain, left eye, right eye, lenses, optic chiasm, target volumes, uninvolved brain, and normal tissue.    PLAN:  The patient will receive 20 Gy in 1 fraction to the right insula metastasis.  The patient will receive 20 Gy in 1 fraction to the left occipital metastasis.   -----------------------------------  Lonie Peak, MD

## 2012-02-26 NOTE — Progress Notes (Signed)
IVstart right antecubital region with a 22 gauge angiocath.  Brisk blood return and withouit any resistenat to flushing.  Area secured with tape.  Simul;ation appointment with IV contrast at 11am. Confirmed that Lauren [name] is not a diabetic.  BUN 18 and Creatinine 0.8 on 02/19/12.  No voiced concerns at the present moment.  Accompanied by her daughter.

## 2012-02-26 NOTE — Addendum Note (Signed)
Encounter addended by: Airon Sahni Mintz Deshay Blumenfeld, RN on: 02/26/2012  6:14 PM<BR>     Documentation filed: Charges VN

## 2012-02-26 NOTE — Progress Notes (Signed)
See progress note under physician encounter. 

## 2012-02-26 NOTE — Progress Notes (Signed)
11:45 am IV in pt's RAC d/c. Band aid applied. Pt tol well, verbalized no c/o. Pt d/c home w/family.

## 2012-02-26 NOTE — Progress Notes (Signed)
Radiation Oncology         [phone] ________________________________  Initial outpatient Consultation  Name: Lauren [name] MRN: [id number]  Date: 02/26/2012  DOB: 03/13/1951  WU:[id number],FAOZHY, MD  Exie Parody, MD   REFERRING PHYSICIAN: Exie Parody, MD  DIAGNOSIS: Metastatic lung cancer to the brain  HISTORY OF PRESENT ILLNESS::Lauren [name] is a 61 y.o. female who has a history of colon cancer which was treated approximately 4 years ago. This was stage TII N0. In 2012 she underwent and left upper lobectomy for early stage adenocarcinoma of the lung. She later to developed recurrent/metastatic non-small cell lung cancer which was treated with chemoradiation in August 2013. Unfortunately, she is recently been found to have 2 brain metastases on an MRI. One is in the right insular/operculum region and the other is in the left occipital region.  She had some headaches and visual changes (diplopia) which have resolved with dexamethasone. She is currently taking 4 mg 4 times a day.  It is presumed that her brain metastases are secondary to lung cancer, which is statistically most likely.  She denies any focal weakness or numbness. She denies any seizures. She does have fatigue.  She continues to smoke normal cigarettes + electronic cigarettes. I counseled her on smoking cessation. She is not interested in any pharmaceutical intervention.  PREVIOUS RADIATION THERAPY: Yes as above  PAST MEDICAL HISTORY:  has a past medical history of Depression; Hyperlipidemia; Hypertension; Colon cancer (11/08); Lung cancer (06/16/10); Lung nodule; Chronic folliculitis; Fibrocystic breast changes; Family history of trichomonal vaginitis (05/2005); Postmenopausal; Depressive disorder; Hypercholesterolemia; GERD (gastroesophageal reflux disease); Cerebral aneurysm; Back pain; Shortness of breath; COPD (chronic obstructive pulmonary disease); Coronary artery disease; Arthritis; Weight loss  (10/22/2011); Status post radiation therapy (11/16/11 - 12/29/11); Status post chemotherapy (comp. 12/29/11); and Brain metastases (02/15/12).    PAST SURGICAL HISTORY: Past Surgical History  Procedure Date  . Colectomy 03/22/07  . Tubal ligation   . Lung lobectomy 06/16/10  . Hernia repair   . Breast surgery     Bil lumpectomy  . Back surgery   . Mediastinoscopy 10/19/2011    Procedure: MEDIASTINOSCOPY;  Surgeon: Alleen Borne, MD;  Location: [facility] OR;  Service: Thoracic;  Laterality: N/A;  . Cardiac cath x3     FAMILY HISTORY: family history includes Breast cancer in her cousin; Cancer in her sister; Gout in her father; Heart disease in her mother; Hypertension in her father; Osteoarthritis in her father; and Stomach cancer in her maternal aunt.  There is no history of Anesthesia problems.  SOCIAL HISTORY:  reports that she has been smoking Cigarettes.  She has been smoking about 1.5 packs per day. She uses smokeless tobacco. She reports that she drinks alcohol. She reports that she does not use illicit drugs.  ALLERGIES: Lisinopril  MEDICATIONS:  Current Outpatient Prescriptions  Medication Sig Dispense Refill  . albuterol (PROVENTIL HFA;VENTOLIN HFA) 108 (90 BASE) MCG/ACT inhaler Inhale 2 puffs into the lungs every 6 (six) hours as needed. For shortness of breath      . aspirin 81 MG EC tablet Take 81 mg by mouth daily.      . cholecalciferol (VITAMIN D) 1000 UNITS tablet Take 1,000 Units by mouth daily.      . cloNIDine (CATAPRES) 0.1 MG tablet Take 0.1 mg by mouth at bedtime.      . diclofenac sodium (VOLTAREN) 1 % GEL Apply 1 application topically 4 (four) times daily. As needed for pain      .  folic acid (FOLVITE) 1 MG tablet Take 1 tablet (1 mg total) by mouth daily.  30 tablet  2  . hydrochlorothiazide (HYDRODIURIL) 25 MG tablet Take 25 mg by mouth daily.      Marland Kitchen levETIRAcetam (KEPPRA) 500 MG tablet Take 1 tablet (500 mg total) by mouth every 12 (twelve) hours.  60 tablet  2  .  nebivolol (BYSTOLIC) 10 MG tablet Take 10 mg by mouth daily.      . nitroGLYCERIN (NITROSTAT) 0.4 MG SL tablet Place 0.4 mg under the tongue every 5 (five) minutes as needed. For chest pain      . omeprazole (PRILOSEC) 20 MG capsule Take 20 mg by mouth daily.        . pravastatin (PRAVACHOL) 40 MG tablet Take 40 mg by mouth daily.       . traMADol (ULTRAM) 50 MG tablet Take 50 mg by mouth every 6 (six) hours as needed. As needed for pain.      Marland Kitchen dexamethasone (DECADRON) 4 MG tablet Take 1 tablet (4 mg total) by mouth 4 (four) times daily. Take with Food.  60 tablet  1  . fluconazole (DIFLUCAN) 100 MG tablet Take 1 tablet (100 mg total) by mouth daily. Take 2 tablets today, then 1 tablet daily for 13 more days.  15 tablet  0  . hydrOXYzine (ATARAX/VISTARIL) 10 MG tablet Take 10 mg by mouth daily as needed. For itching      . oxyCODONE-acetaminophen (PERCOCET/ROXICET) 5-325 MG per tablet Take 1-2 tablets by mouth every 6 (six) hours as needed for pain.  20 tablet  0  . DISCONTD: buPROPion (WELLBUTRIN XL) 150 MG 24 hr tablet Take 1 tablet (150 mg total) by mouth 2 (two) times daily.  60 tablet  5   No current facility-administered medications for this encounter.   Facility-Administered Medications Ordered in Other Encounters  Medication Dose Route Frequency Provider Last Rate Last Dose  . sodium chloride 0.9 % injection 10 mL  10 mL Intravenous Once Lonie Peak, MD   10 mL at 02/26/12 1057    REVIEW OF SYSTEMS: Notable for that described above.   PHYSICAL EXAM:  weight is 167 lb 6.4 oz (75.932 kg). Her oral temperature is 98.3 F (36.8 C). Her blood pressure is 120/75 and her pulse is 73. Her respiration is 18 and oxygen saturation is 99%.   General: Alert and oriented, in no acute distress HEENT: Head is normocephalic. Pupils are equally round and reactive to light. Extraocular movements are intact. Oropharynx is notable for thrush. She is wearing a wig Neck: Neck is supple, no palpable  cervical or supraclavicular lymphadenopathy. Heart: Regular in rate and rhythm with no murmurs, rubs, or gallops. Chest: Slightly decreased breath sounds on the right Abdomen: Soft, nontender, nondistended, with no rigidity or guarding. Extremities: No cyanosis or edema. Lymphatics: No concerning lymphadenopathy. Skin: No concerning lesions. Musculoskeletal: symmetric strength and muscle tone throughout. Neurologic: All visual quadrants intact . Cranial nerves II through XII are grossly intact. No focalities. Speech is fluent. Coordination is intact. Psychiatric: Judgment and insight are intact. Affect is appropriate.  Object recall is 3 out of 3 at 5 minutes    LABORATORY DATA:  Lab Results  Component Value Date   WBC 12.0* 02/19/2012   HGB 13.5 02/19/2012   HCT 41.0 02/19/2012   MCV 79.9 02/19/2012   PLT 261 02/19/2012   Lab Results  Component Value Date   NA 142 02/19/2012   K 3.5 02/19/2012  CL 105 02/19/2012   CO2 26 02/19/2012   Lab Results  Component Value Date   ALT 5 02/15/2012   AST 12 02/15/2012   ALKPHOS 119* 02/15/2012   BILITOT 0.4 02/15/2012      RADIOGRAPHY: Ct Head Wo Contrast  02/15/2012  *RADIOLOGY REPORT*  Clinical Data: Headache.  CT HEAD WITHOUT CONTRAST  Technique:  Contiguous axial images were obtained from the base of the skull through the vertex without contrast.  Comparison: None.  Findings: Bony calvarium is intact.  No midline shift is noted. Ventricular size is within normal limits.  No hemorrhage is noted. There is noted ill-defined low density involving the subcortical white matter of the left occipital lobe concerning for edema.  It is uncertain if this represents infarction or edema secondary to underlying neoplasm given the history of lung cancer.  IMPRESSION: Left occipital low density is noted which may represent either early infarction or white matter edema secondary to underlying neoplasm or metastatic disease.  Further evaluation with CT scan or MRI  scan with contrast administration is recommended.   Original Report Authenticated By: Venita Sheffield., M.D.    Ct Chest W Contrast  02/05/2012  *RADIOLOGY REPORT*  Clinical Data:  Lung cancer.  Colon cancer.  Restaging.  CT CHEST AND ABDOMEN WITH CONTRAST  Technique:  Multidetector CT imaging of the chest and abdomen was performed following the standard protocol during bolus administration of intravenous contrast. As requested, the pelvis was not included.  Contrast: OMNIPAQUE IOHEXOL 300 MG/ML  SOLN  Comparison:  Multiple exams, including 10/08/2011 and 09/28/2011.  CT CHEST  Findings:  Hypodense left thyroid lesion, 1.5 x 1.3 cm, not discernibly hypermetabolic on recent PET CT.  Clustered small bilateral axillary lymph nodes are observed.  Amorphous right paratracheal density has ill-defined margins but a short axis measurement of 1.1 cm on image 18 of series 5 (formerly 2.1 cm).  No new adenopathy.  Prior left upper lobectomy noted.  Centrilobular emphysema is present.  Faint underlying nodularity in the lungs is observed. There is slightly reduced solidity of the right upper lobe nodule measuring 0.6 x 1.0 cm on image 14 of series 4.  Adjacent stable ground-glass nodularity measures 1.3 x 0.8 cm.  Neither of these was hypermetabolic on the recent PET CT.  Additional more vague opacities in the right upper lobe appear stable.  IMPRESSION:  1.  Response to therapy with reduced size and reduced marginal conspicuity of the right paratracheal adenopathy.  The adenopathy has not fully resolved. 2.  Emphysema. 3.  Slightly reduced solidity of the more medial ground-glass nodule on image 14 of series 4.  Stable appearance of the more lateral nodule.  These lesions were not hypermetabolic on 09/2011 PET CT.  Observation of these nodules by CT scan is recommended in order to exclude low grade adenocarcinoma. 4.  Coronary artery atherosclerosis.  CT ABDOMEN  Findings:  Mild hepatic steatosis.  Spleen and  pancreas unremarkable.  Stable left adrenal fullness without overt mass. Right adrenal gland normal.  The gallbladder and biliary system appear unremarkable.  Aortoiliac atherosclerotic calcification noted.  Small left kidney upper pole cyst noted; kidneys otherwise unremarkable.  Visualized portion the appendix unremarkable. Prominence of stool throughout the colon suggests constipation. No pathologic retroperitoneal or porta hepatis adenopathy is identified.  IMPRESSION:  1.  Mild hepatic steatosis. 2.  No findings of malignancy in the upper abdomen.   Original Report Authenticated By: Dellia Cloud, M.D.    Ct  Abdomen W Contrast  02/05/2012  *RADIOLOGY REPORT*  Clinical Data:  Lung cancer.  Colon cancer.  Restaging.  CT CHEST AND ABDOMEN WITH CONTRAST  Technique:  Multidetector CT imaging of the chest and abdomen was performed following the standard protocol during bolus administration of intravenous contrast. As requested, the pelvis was not included.  Contrast: OMNIPAQUE IOHEXOL 300 MG/ML  SOLN  Comparison:  Multiple exams, including 10/08/2011 and 09/28/2011.  CT CHEST  Findings:  Hypodense left thyroid lesion, 1.5 x 1.3 cm, not discernibly hypermetabolic on recent PET CT.  Clustered small bilateral axillary lymph nodes are observed.  Amorphous right paratracheal density has ill-defined margins but a short axis measurement of 1.1 cm on image 18 of series 5 (formerly 2.1 cm).  No new adenopathy.  Prior left upper lobectomy noted.  Centrilobular emphysema is present.  Faint underlying nodularity in the lungs is observed. There is slightly reduced solidity of the right upper lobe nodule measuring 0.6 x 1.0 cm on image 14 of series 4.  Adjacent stable ground-glass nodularity measures 1.3 x 0.8 cm.  Neither of these was hypermetabolic on the recent PET CT.  Additional more vague opacities in the right upper lobe appear stable.  IMPRESSION:  1.  Response to therapy with reduced size and reduced  marginal conspicuity of the right paratracheal adenopathy.  The adenopathy has not fully resolved. 2.  Emphysema. 3.  Slightly reduced solidity of the more medial ground-glass nodule on image 14 of series 4.  Stable appearance of the more lateral nodule.  These lesions were not hypermetabolic on 09/2011 PET CT.  Observation of these nodules by CT scan is recommended in order to exclude low grade adenocarcinoma. 4.  Coronary artery atherosclerosis.  CT ABDOMEN  Findings:  Mild hepatic steatosis.  Spleen and pancreas unremarkable.  Stable left adrenal fullness without overt mass. Right adrenal gland normal.  The gallbladder and biliary system appear unremarkable.  Aortoiliac atherosclerotic calcification noted.  Small left kidney upper pole cyst noted; kidneys otherwise unremarkable.  Visualized portion the appendix unremarkable. Prominence of stool throughout the colon suggests constipation. No pathologic retroperitoneal or porta hepatis adenopathy is identified.  IMPRESSION:  1.  Mild hepatic steatosis. 2.  No findings of malignancy in the upper abdomen.   Original Report Authenticated By: Dellia Cloud, M.D.    Mr Laqueta Jean Wo Contrast  02/25/2012  *RADIOLOGY REPORT*  Clinical Data: Lung and colon cancer.  S R S targeting.  MRI HEAD WITHOUT AND WITH CONTRAST  Technique:  Multiplanar, multiecho pulse sequences of the brain and surrounding structures were obtained according to standard protocol without and with intravenous contrast  Contrast: 15mL MULTIHANCE GADOBENATE DIMEGLUMINE 529 MG/ML IV SOLN  Comparison:  MRI brain 02/15/2012.  Findings: Redemonstrated is a centrally necrotic spherical 15 mm left occipital metastasis with surrounding edema. Also redemonstrated is a 4 mm right opercular cortex without central necrosis.  No interval hemorrhage or significant growth. No restricted diffusion or intracranial hemorrhage.  No significant atrophy.  Minimal chronic microvascular ischemic change.  Prominent  perivascular spaces.  No midline shift.  No significant meningeal enhancement.  No worrisome osseous lesions. Widely patent cerebral vasculature.  Negative orbits, sinuses, and mastoids.  IMPRESSION: Left occipital and right anterior opercular cortex metastases as described.  No additional lesions are seen.  Overall findings are stable compared with 02/15/2012.   Original Report Authenticated By: Elsie Stain, M.D.    Mr Laqueta Jean XB Contrast  02/15/2012  *RADIOLOGY REPORT*  Clinical  Data: 61 year old female with severe headache and photophobia.  History of lung cancer.  Abnormal head CT earlier today.  MRI HEAD WITHOUT AND WITH CONTRAST  Technique:  Multiplanar, multiecho pulse sequences of the brain and surrounding structures were obtained according to standard protocol without and with intravenous contrast  Contrast: 15mL MULTIHANCE GADOBENATE DIMEGLUMINE 529 MG/ML IV SOLN  Comparison: Head CT 02/15/2012.  Brain MRI 10/27/2011.  Findings: There is a left occipital rim enhancing mass measuring 15 mm diameter corresponding to the area of cerebral edema identified earlier today.  There is evidence of trace hemorrhage (series 5 image 12).  Surrounding edema with only mild mass effect/mild effacement of the left occipital horn.  There is a second 5 mm solidly enhancing lesion at the anterior right insula/operculum (series 10 image 33).  No associated edema or mass effect at that site.  No additional enhancing lesion is identified.  No midline shift. No restricted diffusion to suggest acute infarction.  No ventriculomegaly.  No other acute intracranial hemorrhage or mass effect. Major intracranial vascular flow voids are preserved. Visualized bone marrow signal remains normal.  Stable visualized cervical spine.  Negative pituitary and cervicomedullary junction. Visualized orbit soft tissues are within normal limits.  Stable paranasal sinuses.  IMPRESSION: Early intracranial metastatic disease; rim enhancing 15 mm  mass in the left occipital pole corresponding to the area of edema seen earlier, and enhancing 5 mm right insula metastasis without mass effect or edema.   Original Report Authenticated By: Harley Hallmark, M.D.    Dg Bone Density  02/01/2012  The Bone Mineral Densitometry hard-copy report (which includes all data, graphical display, and FRAX results when applicable) has been sent directly to the ordering physician.  This report can also be obtained electronically by viewing images for this exam through the performing facility's EMR, or by logging directly into [employer].   Original Report Authenticated By: Danae Orleans, M.D.    Mm Digital Screening  02/12/2012  *RADIOLOGY REPORT*  Clinical Data: Screening.  DIGITAL BILATERAL SCREENING MAMMOGRAM WITH CAD  Comparison:  Previous exams.  Findings:  There are scattered fibroglandular densities. No suspicious masses, architectural distortion, or calcifications are present.  Images were processed with CAD.  IMPRESSION: No mammographic evidence of malignancy.  A result letter of this screening mammogram will be mailed directly to the patient.  RECOMMENDATION: Screening mammogram in one year. (Code:SM-B-01Y)  BI-RADS CATEGORY 1:  Negative.   Original Report Authenticated By: Britta Mccreedy, M.D.       IMPRESSION/PLAN: I had a lengthy discussion with the patient and her daughter after reviewing her MRI with them.  We spoke about whole brain radiotherapy versus stereotactic radiosurgery to the brain. We spoke about the differing risks benefits and side effects of both of these treatments. During part of our discussion, we spoke about the cognitive side effects and fatigue that can result from whole brain radiotherapy and we spoke about radionecrosis that can result from stereotactic radiosurgery. I explained that whole brain radiotherapy is more comprehensive and therefore can decrease the chance of recurrences elsewhere in the brain while stereotactic radiosurgery  only treats the areas of gross disease while sparing the rest of the brain parenchyma.  After lengthy discussion regarding side effects, consent form was signed and placed in her chart. The patient  would like to proceed with stereotactic radiosurgery to the 2 brain tumors. She'll proceed with treatment planning later this morning. I anticipate that her treatment will take place on Tuesday, October 15.  She will meet with neurosurgery later today to facilitate this in a multidisciplinary manner.  For now, I will keep her on her current dose of dexamethasone. For her thrush, I have provided a prescription for fluconazole.   I spent 60 minutes with the patient face to face, over 50% of that time spent on coordination of care and counseling. __________________________________________   Lonie Peak, MD

## 2012-02-26 NOTE — Patient Instructions (Signed)
.  Metastatic Brain Tumor A brain tumor is a growth in the brain. These tumors may be classified as primary or secondary. A primary brain tumor starts out in the cells of the brain. A secondary brain tumor means the tumor started elsewhere in the body and then traveled to the brain through the bloodstream. This spread of cancer is called metastasis.  Brain metastases outnumber primary tumors by at least 10 to 1. And they occur in 20% to 40% of cancer patients. About one quarter of lung cancer patients are discovered because their cancer spreads to the brain, before the patient knows there are problems in the lungs. The exact incidence of cancer spread to the brain is unknown. But it has been estimated that more new cases are diagnosed each year. This number may be increasing due to:  Capacity of MRI (magnetic resonance imaging) scans to detect small metastases.  Prolonged cancer survival, resulting from improved therapy. 80% of brain metastases occur in the cerebral hemispheres. 15% occur in the cerebellum. 5% occur in the brain stem. In more than 70% of cases, there are multiple metastases to the brain. But solitary metastases also occur.  Brain tumors can develop directly from cancers that exist between the nasal passages and throat (nasopharyngeal region) by spreading along the cranial nerves or though the opening at the base of the skull (foramen magnum). Metastases in the thick covering over the brain surface (dura) may count for up to 9% of total central nervous system (CNS) metastases. Even if a patient has had a previous cancer, a lesion in the brain should not be assumed to be a metastasis. This could result in overlooking appropriate treatment of a curable tumor. Brain tumors that start in the brain (primary) rarely spread to other areas of the body. But they can spread to other parts of the brain and to the spinal cord (axis). The most common primary cancers that cause a metastasis in the brain  are:  Lung cancer (50%).  Unknown primary cancer (10% to 15%).  Colon cancer (5%).  Breast cancer (15% to 20%).  Skin cancer (melanoma) (10%). SYMPTOMS  A patient with a brain tumor may experience:  Headaches.  Weakness.  Seizures.  Sensory problems.  Problems with walking.  Tiredness.  Emotional changes.  Personality changes. Family and friends are often the first to notice emotional and personality changes. DIAGNOSIS  The diagnosis of brain metastases in cancer patients is based on:  Patient history.  A nervous system function test (neurological exam).  Specialized X-rays.  A brain tissue sample (biopsy). This is usually a stereotactic biopsy, which uses a precisely directed, delicate instrument. Biopsy may be needed when there is a:  Solitary lesion.  Questionable relationship to a primary tumor, if one is present elsewhere in the body. TREATMENT  Your treatment will depend on what the problem is and where it came from. Sometimes, removal of the tumor will tell you where it started. It may be that the primary tumor can also be removed. Chemotherapy or radiation treatments are sometimes required. Your caregiver will discuss treatment options with you. Document Released: 01/29/2004 Document Revised: 07/27/2011 Document Reviewed: 05/01/2008 [facility] Patient Information 2013 [place], [place].

## 2012-02-26 NOTE — Progress Notes (Signed)
Patient presents to the clinic today accompanied by her daughter for a consultation with Dr. Basilio Cairo to discuss the role of radiation therapy in the treatment of brain mets. Patient is alert and oriented to person, place, and time. No distress noted. Steady gait noted. Pleasant affect noted. Patient denies pain at this time. Patient reports taking Decadron 4 mg four time per day and Keppra. Patient reports that some day she has a mild dull headache on the left side of her head. Patient denies having any headache since beginning the decadron as intense as the initial one that sent her to the ED. Patient reports that her balance is off. Patient reports that she has not had anymore blurred vision and floaters since the day she presented to the ED. Patient reports difficulty sleeping at night related to effects of anxiety. Patient reports fatigue. Patient denies nausea, vomiting, diarrhea, or constipation. Patient reports a good appetite and stable weight. Reported all finding to Dr. Basilio Cairo.  [id number] Hx of radiation therapy Denies having a pacemaker  REQUESTING REFILL ON DECADRON; TWO PILLS LEFT

## 2012-03-01 ENCOUNTER — Encounter: Payer: Self-pay | Admitting: Radiation Oncology

## 2012-03-01 ENCOUNTER — Ambulatory Visit
Admission: RE | Admit: 2012-03-01 | Discharge: 2012-03-01 | Disposition: A | Discharge status: Home / Self Care | Payer: Medicare Other | Source: Ambulatory Visit | Attending: Radiation Oncology | Admitting: Radiation Oncology

## 2012-03-01 VITALS — BP 150/82 | HR 68 | Temp 97.6°F | Resp 20

## 2012-03-01 DIAGNOSIS — Z923 Personal history of irradiation: Secondary | ICD-10-CM

## 2012-03-01 DIAGNOSIS — C7949 Secondary malignant neoplasm of other parts of nervous system: Secondary | ICD-10-CM

## 2012-03-01 HISTORY — DX: Personal history of irradiation: Z92.3

## 2012-03-01 NOTE — Progress Notes (Signed)
  Radiation Oncology         [phone] ________________________________  Stereotactic Treatment Procedure Note  Name: NAARA KELTY MRN: [id number]  Date: 03/01/2012  DOB: 1950-08-07  SPECIAL TREATMENT PROCEDURE  3D TREATMENT PLANNING AND DOSIMETRY:  The patient's radiation plan was reviewed and approved by neurosurgery and radiation oncology prior to treatment.  It showed 3-dimensional radiation distributions overlaid onto the planning CT/MRI image set.  The [facility] for the target structures as well as the organs at risk were reviewed. The documentation of the 3D plan and dosimetry are filed in the radiation oncology EMR.  NARRATIVE:  CLINT STRUPP was brought to the TrueBeam stereotactic radiation treatment machine and placed supine on the CT couch. The head frame was applied, and the patient was set up for stereotactic radiosurgery.  Neurosurgery was present for the set-up and delivery  SIMULATION VERIFICATION:  In the couch zero-angle position, the patient underwent Exactrac imaging using the Brainlab system with orthogonal KV images.  These were carefully aligned and repeated to confirm treatment position for each of the isocenters.  The Exactrac snap film verification was repeated at each couch angle.  SPECIAL TREATMENT PROCEDURE: Catalina Gravel received stereotactic radiosurgery to the following targets:  Left occipital 15mm target was treated using 4 Dynamic Conformal Arcs to a prescription dose of 20 Gy.  ExacTrac Snap verification was performed for each couch angle.  Right insula 4mm target was treated using 3 Circular Arcs to a prescription dose of 20 Gy.  ExacTrac Snap verification was performed for each couch angle.  This constitutes a special treatment procedure due to the ablative dose delivered and the technical nature of treatment.  This highly technical modality of treatment ensures that the ablative dose is centered on the patient's tumor while sparing  normal tissues from excessive dose and risk of detrimental effects.  STEREOTACTIC TREATMENT MANAGEMENT:  Following delivery, the patient was transported to nursing in stable condition and monitored for possible acute effects.  Vital signs were recorded BP 150/82  Pulse 68  Temp 97.6 F (36.4 C) (Oral)  Resp 20. The patient tolerated treatment without significant acute effects, and was discharged to home in stable condition.    PLAN: Follow-up in one month.  ________________________________   Lonie Peak, MD

## 2012-03-01 NOTE — Progress Notes (Signed)
Patient had SRS tx brought to room 1 at nursing, vitals taken, sprite offered, patient declined offer of blanket, gave call bell and remote control for tv within reach of patient, patient has a dull headache, and feels like off balance in between her ears from medication, female family member at side 1:30 PM'

## 2012-03-01 NOTE — Progress Notes (Signed)
[facility] Radiation Oncology End of Treatment Note  Name:Lauren [name]  Date: 03/01/2012 ZOX:[id number] DOB:08/03/1950   Status:outpatient    CC: CHAMBERLAIN,RACHEL, MD     DIAGNOSIS: 2 brain metastases, metastatic lung cancer to the brain    INDICATION FOR TREATMENT: Palliative   TREATMENT DATES: 03/01/2012                         SITE/DOSE/BEAMS/ENERGY: The patient was treated with stereotactic radiosurgery                       1) Left occipital 15mm target was treated using 4 Dynamic Conformal Arcs to a prescription dose of 20 Gy in 1 fraction. 6 MV photons, flattening filter free, were used.  2) Right insula 4mm target was treated using 3 Circular Arcs to a prescription dose of 20 Gy in 1 fraction. 6 MV photons, flattening filter free, were used.             NARRATIVE:    The patient tolerated her treatments very well without any acute complications. I provided her with specific  instructions as to how she should taper her dexamethasone through November 18. On that day, her last dose of Dexamethasone, I will see her for followup.  PLAN: Routine followup in one month. Patient instructed to call if questions or worsening complaints in interim.   -----------------------------------  Lonie Peak, MD

## 2012-03-01 NOTE — Addendum Note (Signed)
Encounter addended by: Lonie Peak, MD on: 03/01/2012  3:33 PM<BR>     Documentation filed: Inpatient Patient Education, Patient Instructions Section, Notes Section

## 2012-03-01 NOTE — Patient Instructions (Addendum)
Dexamethasone Taper  10-15 through 10-21: take 4mg  three times a day with meals 10-22 through 10-28: take 4mg  twice a day with meals 10-29 through 11-4: take 4mg  at breakfast only 11-5 through 11-11: take 2mg  (half pill) at breakfast only 11-12 through 11-18: take 2mg  (half pill) at breakfast every other day. Last Dose on 11-18.  Talk to Dr. Gaylyn Rong about when to stop Keppra.

## 2012-03-01 NOTE — Addendum Note (Signed)
Encounter addended by: Lonie Peak, MD on: 03/01/2012  3:41 PM<BR>     Documentation filed: Inpatient Notes, Notes Section

## 2012-03-03 ENCOUNTER — Other Ambulatory Visit (HOSPITAL_BASED_OUTPATIENT_CLINIC_OR_DEPARTMENT_OTHER): Payer: Medicare Other | Admitting: Lab

## 2012-03-03 ENCOUNTER — Telehealth: Payer: Self-pay | Admitting: Oncology

## 2012-03-03 ENCOUNTER — Ambulatory Visit (HOSPITAL_BASED_OUTPATIENT_CLINIC_OR_DEPARTMENT_OTHER): Payer: Medicare Other | Admitting: Oncology

## 2012-03-03 VITALS — BP 134/76 | HR 73 | Temp 97.3°F | Resp 20 | Ht 64.0 in | Wt 163.0 lb

## 2012-03-03 DIAGNOSIS — C3490 Malignant neoplasm of unspecified part of unspecified bronchus or lung: Secondary | ICD-10-CM

## 2012-03-03 DIAGNOSIS — C187 Malignant neoplasm of sigmoid colon: Secondary | ICD-10-CM

## 2012-03-03 DIAGNOSIS — C3410 Malignant neoplasm of upper lobe, unspecified bronchus or lung: Secondary | ICD-10-CM

## 2012-03-03 DIAGNOSIS — C7949 Secondary malignant neoplasm of other parts of nervous system: Secondary | ICD-10-CM

## 2012-03-03 DIAGNOSIS — R630 Anorexia: Secondary | ICD-10-CM

## 2012-03-03 LAB — COMPREHENSIVE METABOLIC PANEL (CC13)
AST: 14 U/L (ref 5–34)
Albumin: 3.1 g/dL — ABNORMAL LOW (ref 3.5–5.0)
Alkaline Phosphatase: 81 U/L (ref 40–150)
BUN: 27 mg/dL — ABNORMAL HIGH (ref 7.0–26.0)
Potassium: 3.8 mEq/L (ref 3.5–5.1)
Sodium: 134 mEq/L — ABNORMAL LOW (ref 136–145)

## 2012-03-03 LAB — CBC WITH DIFFERENTIAL/PLATELET
Basophils Absolute: 0 10*3/uL (ref 0.0–0.1)
EOS%: 0 % (ref 0.0–7.0)
MCH: 27.1 pg (ref 25.1–34.0)
MCV: 82.3 fL (ref 79.5–101.0)
MONO%: 6.4 % (ref 0.0–14.0)
RBC: 5.48 10*6/uL — ABNORMAL HIGH (ref 3.70–5.45)
RDW: 19.3 % — ABNORMAL HIGH (ref 11.2–14.5)

## 2012-03-03 NOTE — Op Note (Signed)
[facility]  Radiation Oncology   Name:Lauren [name]  Date: 03/01/2012  UJW:[id number]  DOB:1951/02/12  Status:outpatient   DIAGNOSIS: 2 brain metastases, metastatic lung cancer to the brain   INDICATION FOR TREATMENT: Palliative   TREATMENT DATES: 03/01/2012   SITE/DOSE/BEAMS/ENERGY: The patient was treated with stereotactic radiosurgery  1) Left occipital 15mm target was treated using 4 Dynamic Conformal Arcs to a prescription dose of 20 Gy in 1 fraction. 6 MV photons, flattening filter free, were used.  2) Right insula 4mm target was treated using 3 Circular Arcs to a prescription dose of 20 Gy in 1 fraction.  6 MV photons, flattening filter free, were used.   NARRATIVE: The patient tolerated her treatments very well without any acute complications.pt provided with specific instructions as to how she should taper her dexamethasone through November 18.  PLAN: Routine followup in one month with rad onc - 3 month with me . Patient instructed to call if questions or worsening complaints in interim.  -----------------------------------

## 2012-03-03 NOTE — Telephone Encounter (Signed)
appts made and printed for pt aom °

## 2012-03-03 NOTE — Progress Notes (Signed)
[facility]  [phone] [phone]) [phone]   OFFICE PROGRESS NOTE   Cc:  CHAMBERLAIN,RACHEL, MD  PAST DIAGNOSIS AND PAST THERAPY:  1. 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 Her post resection surveillance colonoscopy onOctober 14, 2009, by Dr. Christella Hartigan was negative. 2. pT1a, pN0 well differentiated adenocarcinoma of lung; s/p left upper lobectomy on 06/16/2010. 3. 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  4. 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  She underwent stereotactic radiosurgery.  The left occipital 15mm target was treated using 4 Dynamic Conformal Arcs to a prescription dose of 20 Gy in 1 fraction. 6 MV photons, flattening filter free, were used. The right insula 4mm target was treated using 3 Circular Arcs to a prescription dose of 20 Gy in 1 fraction.  6 MV photons, flattening filter free, were used  CURRENT THERAPY: due to start maintenance chemo Alimta q3 weeks on 03/07/2012.    INTERVAL HISTORY: Lauren [name] 61 y.o. female returns for regular check up before restarting chemo on 03/07/2012.  She underwent palliative stereotactic brain radiation without major problem.  She noticed some mild to moderate fatigue.  She is still able to live by herself and is independent of all activities of daily living.  She drove herself to clinic today.  She has decreased appetite and some weight loss.  She denied headache, confusion, seizure, visual changes, cough, SOB, DOE, hemoptysis, bone pain, jaundice, back pain, leg weakness/paresthesia, bowel/bladder incontinence.    Past Medical History  Diagnosis Date  . Depression   . Hyperlipidemia   .  Hypertension   . Colon cancer 11/08  . Lung cancer 06/16/10    PET scan 04/28/2010; primary: increase in size 02/2010  . Lung nodule     FNA ordered for 04/02/10 by HA>pos Ca  . Chronic folliculitis     of groin  . Fibrocystic breast changes   . Family history of trichomonal vaginitis 05/2005  . Postmenopausal   . Depressive disorder   . Hypercholesterolemia   . GERD (gastroesophageal reflux disease)   . Cerebral aneurysm   . Back pain   . Shortness of breath   . COPD (chronic obstructive pulmonary disease)   . Coronary artery disease   . Arthritis   . Weight loss 10/22/2011  . Status post radiation therapy 11/16/11 - 12/29/11    Right Lung and Mediastinum: 60 Gy  . Status post chemotherapy comp. 12/29/11    Carboplatin/Taxol  . Brain metastases 02/15/12    Past Surgical History  Procedure Date  . Colectomy 03/22/07  . Tubal ligation   . Lung lobectomy 06/16/10  . Hernia repair   . Breast surgery     Bil lumpectomy  . Back surgery   . Mediastinoscopy 10/19/2011    Procedure: MEDIASTINOSCOPY;  Surgeon: Alleen Borne, MD;  Location: [facility] OR;  Service: Thoracic;  Laterality: N/A;  . Cardiac cath x3     Current Outpatient Prescriptions  Medication Sig Dispense Refill  . albuterol (PROVENTIL HFA;VENTOLIN HFA) 108 (90 BASE) MCG/ACT inhaler Inhale 2 puffs into the lungs every 6 (six) hours as needed. For shortness of breath      . aspirin 81 MG EC tablet Take 81 mg by mouth daily.      Marland Kitchen  cholecalciferol (VITAMIN D) 1000 UNITS tablet Take 1,000 Units by mouth daily.      . cloNIDine (CATAPRES) 0.1 MG tablet Take 0.1 mg by mouth at bedtime.      Marland Kitchen dexamethasone (DECADRON) 4 MG tablet Take 1 tablet (4 mg total) by mouth 4 (four) times daily. Take with Food.  60 tablet  1  . diclofenac sodium (VOLTAREN) 1 % GEL Apply 1 application topically 4 (four) times daily. As needed for pain      . fluconazole (DIFLUCAN) 100 MG tablet Take 1 tablet (100 mg total) by mouth daily. Take 2 tablets today,  then 1 tablet daily for 13 more days.  15 tablet  0  . folic acid (FOLVITE) 1 MG tablet Take 1 tablet (1 mg total) by mouth daily.  30 tablet  2  . hydrochlorothiazide (HYDRODIURIL) 25 MG tablet Take 25 mg by mouth daily.      . hydrOXYzine (ATARAX/VISTARIL) 10 MG tablet Take 10 mg by mouth daily as needed. For itching      . levETIRAcetam (KEPPRA) 500 MG tablet Take 1 tablet (500 mg total) by mouth every 12 (twelve) hours.  60 tablet  2  . nebivolol (BYSTOLIC) 10 MG tablet Take 10 mg by mouth daily.      . nitroGLYCERIN (NITROSTAT) 0.4 MG SL tablet Place 0.4 mg under the tongue every 5 (five) minutes as needed. For chest pain      . omeprazole (PRILOSEC) 20 MG capsule Take 20 mg by mouth daily.        Marland Kitchen oxyCODONE-acetaminophen (PERCOCET/ROXICET) 5-325 MG per tablet Take 1-2 tablets by mouth every 6 (six) hours as needed for pain.  20 tablet  0  . pravastatin (PRAVACHOL) 40 MG tablet Take 40 mg by mouth daily.       . traMADol (ULTRAM) 50 MG tablet Take 50 mg by mouth every 6 (six) hours as needed. As needed for pain.      Marland Kitchen DISCONTD: buPROPion (WELLBUTRIN XL) 150 MG 24 hr tablet Take 1 tablet (150 mg total) by mouth 2 (two) times daily.  60 tablet  5    ALLERGIES:  is allergic to lisinopril.  REVIEW OF SYSTEMS:  The rest of the 14-point review of system was negative.   Filed Vitals:   03/03/12 1030  BP: 134/76  Pulse: 73  Temp: 97.3 F (36.3 C)  Resp: 20   Wt Readings from Last 3 Encounters:  03/03/12 163 lb (73.936 kg)  02/26/12 167 lb 6.4 oz (75.932 kg)  02/19/12 165 lb 6.4 oz (75.025 kg)   ECOG Performance status: 1  PHYSICAL EXAMINATION:   General:  Thin-appearing woman, in no acute distress.  Eyes:  no scleral icterus.  ENT:  There were no oropharyngeal lesions.  Neck was without thyromegaly.  Lymphatics:  Negative cervical, supraclavicular or axillary adenopathy.  Respiratory: lungs were clear bilaterally without wheezing or crackles.  Cardiovascular:  Regular rate and  rhythm, S1/S2, without murmur, rub or gallop.  There was no pedal edema.  GI:  abdomen was soft, flat, nontender, nondistended, without organomegaly.  Muscoloskeletal:  no spinal tenderness of palpation of vertebral spine.  Skin exam was without echymosis, petichae.  Neuro exam was nonfocal. PERRL, EOMI, CN II-XII grossly intact.  There was no dysmetria.  Patient was able to get on and off exam table without assistance.  Gait was normal.  Patient was alerted and oriented.  Attention was good.   Language was appropriate.  Mood was normal without  depression.  Speech was not pressured.  Thought content was not tangential.     LABORATORY/RADIOLOGY DATA:  Lab Results  Component Value Date   WBC 13.2* 03/03/2012   HGB 14.8 03/03/2012   HCT 45.1 03/03/2012   PLT 209 03/03/2012   GLUCOSE 195* 03/03/2012   CHOL 190 12/08/2010   TRIG 104 12/08/2010   HDL 39* 12/08/2010   LDLDIRECT 111* 02/27/2010   LDLCALC 130* 12/08/2010   ALKPHOS 81 03/03/2012   ALT 33 03/03/2012   AST 14 03/03/2012   NA 134* 03/03/2012   K 3.8 03/03/2012   CL 99 03/03/2012   CREATININE 0.9 03/03/2012   BUN 27.0* 03/03/2012   CO2 25 03/03/2012   INR 1.10 10/19/2011     ASSESSMENT AND PLAN:   1. Recurrent Lung cancer now with brain med:  - s/p brain radiation.  - Due to start maintenance chemo Alimta 03/07/2012. - I discussed with her potential side effects of Alimta which include but not limited to mouth sore, fatigue, skin rash, hair loss, low blood count, infection, bleeding.  - She will continue to decrease Dexamethasone as instructed by Dr. Basilio Cairo. - I advised her to decrease Keppra (antiseizure med):  *  250mg  in the morning (1/2 pill); 500mg  at night (1 whole pill) x 1 week; then   *  250mg  in the morning and at night x 1 week;   *  125 mg (1/4 pill) in the morning, and 250 (1/2 pill) at night x 1 week;   *  125 mg in the morning and at night x 1 week;  *   125 mg at night only x  1week.  THEN OFF.   2. History of  colon cancer: She has no evidence of recurrent or metastatic disease for her colon cancer. Surveillance  for her history of colon cancer entails once-a-year CT scan for 5 years' total, with the next CT abdomen in Sep 2014 since her CT abdomen/pelvis this time was negative. She had a surveillance colonoscopy in October 2012 by Dr Christella Hartigan, which showed polyps. Per his recommendation, the next colonoscopy is due in Oct 2015 but sooner if she has symptoms.   3. Smoking: only on smokeless cigarettes now.   4. Hypertension: She is on nebivolol, Catapres, HCTZ and per PCP.   5. Hypercholesterolemia: She is on pravastatin per PCP.   7. Anorexia: Due to new diagnosis of recurrent lung cancer and recent brain radiation.  She wanted to defer appetite stimulant such as marinol until she is off of Dex and Kepra and see how she does.    8. Follow up: in about 3 weeks prior to cycle#2 of Alimta.  9. CODE STATUS: she decided to be FULL CODE with previous discussion.  We will address this again when her clinical status worsens.      The length of time of the face-to-face encounter was 15  minutes. More than 50% of time was spent counseling and coordination of care.

## 2012-03-03 NOTE — Patient Instructions (Addendum)
1.  Diagnosis:  Metastatic nonsmall cell lung cancer; with met to the brain. 2.  Treatment:  Due to start maintenance chemo Alimta 03/07/2012. 3.  Potential side effects of Alimta include but not limited to mouth sore, fatigue, skin rash, hair loss, low blood count, infection, bleeding.  4.  Follow up:  03/28/2012 before the 2nd cycle of chemo.  5.  Decrease Dexamethasone as instructed by Dr. Basilio Cairo. 6.  Decrease Keppra (antiseizure med):  *  250mg  in the morning (1/2 pill); 500mg  at night (1 whole pill) x 1 week; then   *  250mg  in the morning and at night x 1 week;   *  125 mg (1/4 pill) in the morning, and 250 (1/2 pill) at night x 1 week;   *  125 mg in the morning and at night x 1 week;  *   125 mg at night only x  1week.  THEN OFF.

## 2012-03-07 ENCOUNTER — Ambulatory Visit (HOSPITAL_BASED_OUTPATIENT_CLINIC_OR_DEPARTMENT_OTHER): Payer: Medicare Other

## 2012-03-07 VITALS — BP 129/75 | HR 80 | Temp 97.4°F | Resp 20

## 2012-03-07 DIAGNOSIS — Z5111 Encounter for antineoplastic chemotherapy: Secondary | ICD-10-CM

## 2012-03-07 DIAGNOSIS — C3410 Malignant neoplasm of upper lobe, unspecified bronchus or lung: Secondary | ICD-10-CM

## 2012-03-07 DIAGNOSIS — C7949 Secondary malignant neoplasm of other parts of nervous system: Secondary | ICD-10-CM

## 2012-03-07 DIAGNOSIS — C3490 Malignant neoplasm of unspecified part of unspecified bronchus or lung: Secondary | ICD-10-CM

## 2012-03-07 MED ORDER — SODIUM CHLORIDE 0.9 % IV SOLN
500.0000 mg/m2 | Freq: Once | INTRAVENOUS | Status: AC
  Administered 2012-03-07: 925 mg via INTRAVENOUS
  Filled 2012-03-07: qty 37

## 2012-03-07 MED ORDER — SODIUM CHLORIDE 0.9 % IV SOLN
Freq: Once | INTRAVENOUS | Status: AC
  Administered 2012-03-07: 15:00:00 via INTRAVENOUS

## 2012-03-07 MED ORDER — ONDANSETRON 8 MG/50ML IVPB (CHCC)
8.0000 mg | Freq: Once | INTRAVENOUS | Status: AC
  Administered 2012-03-07: 8 mg via INTRAVENOUS

## 2012-03-07 MED ORDER — DEXAMETHASONE SODIUM PHOSPHATE 10 MG/ML IJ SOLN
10.0000 mg | Freq: Once | INTRAMUSCULAR | Status: AC
  Administered 2012-03-07: 10 mg via INTRAVENOUS

## 2012-03-07 NOTE — Patient Instructions (Addendum)
[facility] Discharge Instructions for Patients Receiving Chemotherapy  Today you received the following chemotherapy agents Alimta.  To help prevent nausea and vomiting after your treatment, we encourage you to take your nausea medication as directed by Dr. Jethro Bolus.   If you develop nausea and vomiting that is not controlled by your nausea medication, call the clinic. If it is after clinic hours your family physician or the after hours number for the clinic or go to the Emergency Department.   BELOW ARE SYMPTOMS THAT SHOULD BE REPORTED IMMEDIATELY:  *FEVER GREATER THAN 100.5 F  *CHILLS WITH OR WITHOUT FEVER  NAUSEA AND VOMITING THAT IS NOT CONTROLLED WITH YOUR NAUSEA MEDICATION  *UNUSUAL SHORTNESS OF BREATH  *UNUSUAL BRUISING OR BLEEDING  TENDERNESS IN MOUTH AND THROAT WITH OR WITHOUT PRESENCE OF ULCERS  *URINARY PROBLEMS  *BOWEL PROBLEMS  UNUSUAL RASH Items with * indicate a potential emergency and should be followed up as soon as possible.  Feel free to call the clinic you have any questions or concerns. The clinic phone number is [phone].

## 2012-03-08 ENCOUNTER — Other Ambulatory Visit: Payer: Self-pay | Admitting: Certified Registered Nurse Anesthetist

## 2012-03-08 ENCOUNTER — Telehealth: Payer: Self-pay | Admitting: *Deleted

## 2012-03-08 NOTE — Telephone Encounter (Signed)
Message copied by Augusto Garbe on Tue Mar 08, 2012 11:01 AM ------      Message from: [place], [place] E      Created: Mon Mar 07, 2012  5:40 PM      Regarding: Chemo Follow up       First time Alimta, has rec'd previous chemo

## 2012-03-08 NOTE — Telephone Encounter (Signed)
Spoke with ms. Guthridge and she is resting in bed.  Reports having trouble resting some nights.  Denies any n/v or side effects.  Thanked the staff at the [facility] on all levels for doing a good job caring and supporting her through four episodes of chemotherapy.  Denies questions at this time.  Encouraged to call if any changes or questions.

## 2012-03-21 ENCOUNTER — Other Ambulatory Visit (HOSPITAL_COMMUNITY)
Admission: RE | Admit: 2012-03-21 | Discharge: 2012-03-21 | Disposition: A | Discharge status: Home / Self Care | Payer: Medicare Other | Source: Ambulatory Visit | Attending: Family Medicine | Admitting: Family Medicine

## 2012-03-21 ENCOUNTER — Ambulatory Visit (INDEPENDENT_AMBULATORY_CARE_PROVIDER_SITE_OTHER): Payer: Medicare Other | Admitting: Family Medicine

## 2012-03-21 ENCOUNTER — Encounter: Payer: Self-pay | Admitting: Family Medicine

## 2012-03-21 VITALS — BP 123/63 | HR 93 | Temp 99.0°F | Ht 64.0 in | Wt 161.0 lb

## 2012-03-21 DIAGNOSIS — I10 Essential (primary) hypertension: Secondary | ICD-10-CM

## 2012-03-21 DIAGNOSIS — E7800 Pure hypercholesterolemia, unspecified: Secondary | ICD-10-CM

## 2012-03-21 DIAGNOSIS — Z113 Encounter for screening for infections with a predominantly sexual mode of transmission: Secondary | ICD-10-CM | POA: Insufficient documentation

## 2012-03-21 DIAGNOSIS — F17200 Nicotine dependence, unspecified, uncomplicated: Secondary | ICD-10-CM

## 2012-03-21 DIAGNOSIS — Z01419 Encounter for gynecological examination (general) (routine) without abnormal findings: Secondary | ICD-10-CM | POA: Insufficient documentation

## 2012-03-21 DIAGNOSIS — Z0000 Encounter for general adult medical examination without abnormal findings: Secondary | ICD-10-CM

## 2012-03-21 DIAGNOSIS — Z1151 Encounter for screening for human papillomavirus (HPV): Secondary | ICD-10-CM | POA: Insufficient documentation

## 2012-03-21 DIAGNOSIS — R87810 Cervical high risk human papillomavirus (HPV) DNA test positive: Secondary | ICD-10-CM | POA: Insufficient documentation

## 2012-03-21 DIAGNOSIS — N760 Acute vaginitis: Secondary | ICD-10-CM

## 2012-03-21 DIAGNOSIS — Z124 Encounter for screening for malignant neoplasm of cervix: Secondary | ICD-10-CM

## 2012-03-21 LAB — LIPID PANEL: Cholesterol: 198 mg/dL (ref 0–200)

## 2012-03-21 LAB — HIV ANTIBODY (ROUTINE TESTING W REFLEX): HIV: NONREACTIVE

## 2012-03-21 LAB — RPR

## 2012-03-21 NOTE — Assessment & Plan Note (Signed)
Will check GC/Chlamydia, HIV, RPR.  

## 2012-03-21 NOTE — Assessment & Plan Note (Signed)
Continues to smoke, patient back in contemplative stage of quitting, encouraged her to go back to e-cigarettes or other forms of nicotine replacement.

## 2012-03-21 NOTE — Progress Notes (Signed)
  Subjective:    Patient ID: Lauren [name], female    DOB: [birth year], 61 y.o.   MRN: [id number]  HPI  Lauren [name] comes in for her well woman exam.  She is in good spirits.  She has had a recurrence of her lung cancer and has been found to have a metastasis to her brain.  She is undergoing both chemotherapy and radiation (to brain).    HTN: Taking bystolic and hctz without difficulty.  Denies chest pain, dizziness, palpitations, LE edema.  Patient does not check blood pressures.  HLD: Patient is taking Pravastatin without difficulty.  Last lipid profile was more than a year ago.    Tobacco abuse- patient had quit cigarettes and was using e-cigarettes, but now is smoking nearly a pack a day again.  She says she know she needs to go back to the e-cigarettes.   I have reviewed the patient's medical history, family history, social history, in detail and updated the computerized patient record.  Review of Systems See HPI    Objective:   Physical Exam BP 123/63  Pulse 93  Temp 99 F (37.2 C) (Oral)  Ht 5\' 4"  (1.626 m)  Wt 161 lb (73.029 kg)  BMI 27.64 kg/m2 General appearance: alert, cooperative and no distress Neck: supple, symmetrical, trachea midline and thyroid not enlarged, symmetric, no tenderness/mass/nodules Lungs: clear to auscultation bilaterally Heart: regular rate and rhythm, S1, S2 normal, no murmur, click, rub or gallop Pelvic: cervix normal in appearance, external genitalia normal, no adnexal masses or tenderness, no cervical motion tenderness, rectovaginal septum normal, uterus normal size, shape, and consistency and vagina normal without discharge Extremities: extremities normal, atraumatic, no cyanosis or edema       Assessment & Plan:

## 2012-03-21 NOTE — Assessment & Plan Note (Signed)
Pap done today, patient up to date on all other health maintenance except Zostavax (cannot get now while on chemo).

## 2012-03-21 NOTE — Assessment & Plan Note (Signed)
Well controlled on current regimen, no medication changes today.

## 2012-03-21 NOTE — Patient Instructions (Signed)
It was good to see you.  Your blood pressure today was BP: 123/63 mmHg.  Remember your goal blood pressure is about 130/80.  Please be sure to take your medication every day.  Great job!  I will send you a letter with your lab results, or call you if anything is abnormal.

## 2012-03-21 NOTE — Assessment & Plan Note (Signed)
Relatively well controlled in the past on Pravachol, will check fasting lipid profile today.

## 2012-03-28 ENCOUNTER — Other Ambulatory Visit: Payer: Self-pay | Admitting: Oncology

## 2012-03-28 ENCOUNTER — Other Ambulatory Visit (HOSPITAL_BASED_OUTPATIENT_CLINIC_OR_DEPARTMENT_OTHER): Payer: No Typology Code available for payment source | Admitting: Lab

## 2012-03-28 ENCOUNTER — Encounter: Payer: Self-pay | Admitting: Oncology

## 2012-03-28 ENCOUNTER — Telehealth: Payer: Self-pay | Admitting: *Deleted

## 2012-03-28 ENCOUNTER — Ambulatory Visit (HOSPITAL_BASED_OUTPATIENT_CLINIC_OR_DEPARTMENT_OTHER): Payer: Medicare Other

## 2012-03-28 ENCOUNTER — Ambulatory Visit (HOSPITAL_BASED_OUTPATIENT_CLINIC_OR_DEPARTMENT_OTHER): Payer: Medicare Other | Admitting: Oncology

## 2012-03-28 VITALS — BP 123/68 | HR 78 | Temp 98.6°F | Resp 20 | Ht 64.0 in | Wt 164.8 lb

## 2012-03-28 DIAGNOSIS — C7949 Secondary malignant neoplasm of other parts of nervous system: Secondary | ICD-10-CM

## 2012-03-28 DIAGNOSIS — C3490 Malignant neoplasm of unspecified part of unspecified bronchus or lung: Secondary | ICD-10-CM

## 2012-03-28 DIAGNOSIS — C3410 Malignant neoplasm of upper lobe, unspecified bronchus or lung: Secondary | ICD-10-CM

## 2012-03-28 DIAGNOSIS — Z5111 Encounter for antineoplastic chemotherapy: Secondary | ICD-10-CM

## 2012-03-28 DIAGNOSIS — R630 Anorexia: Secondary | ICD-10-CM

## 2012-03-28 DIAGNOSIS — C7931 Secondary malignant neoplasm of brain: Secondary | ICD-10-CM

## 2012-03-28 DIAGNOSIS — C187 Malignant neoplasm of sigmoid colon: Secondary | ICD-10-CM

## 2012-03-28 LAB — CBC WITH DIFFERENTIAL/PLATELET
Basophils Absolute: 0 10*3/uL (ref 0.0–0.1)
EOS%: 0.4 % (ref 0.0–7.0)
Eosinophils Absolute: 0 10*3/uL (ref 0.0–0.5)
HCT: 38.4 % (ref 34.8–46.6)
HGB: 12.5 g/dL (ref 11.6–15.9)
MCH: 27.1 pg (ref 25.1–34.0)
MONO#: 0.3 10*3/uL (ref 0.1–0.9)
NEUT#: 1.8 10*3/uL (ref 1.5–6.5)
RDW: 15.8 % — ABNORMAL HIGH (ref 11.2–14.5)
WBC: 2.8 10*3/uL — ABNORMAL LOW (ref 3.9–10.3)
lymph#: 0.7 10*3/uL — ABNORMAL LOW (ref 0.9–3.3)

## 2012-03-28 LAB — COMPREHENSIVE METABOLIC PANEL (CC13)
ALT: 23 U/L (ref 0–55)
AST: 16 U/L (ref 5–34)
CO2: 30 mEq/L — ABNORMAL HIGH (ref 22–29)
Chloride: 102 mEq/L (ref 98–107)
Sodium: 142 mEq/L (ref 136–145)
Total Bilirubin: 0.65 mg/dL (ref 0.20–1.20)
Total Protein: 6.3 g/dL — ABNORMAL LOW (ref 6.4–8.3)

## 2012-03-28 MED ORDER — SODIUM CHLORIDE 0.9 % IV SOLN
Freq: Once | INTRAVENOUS | Status: AC
  Administered 2012-03-28: 16:00:00 via INTRAVENOUS

## 2012-03-28 MED ORDER — DEXAMETHASONE SODIUM PHOSPHATE 10 MG/ML IJ SOLN
10.0000 mg | Freq: Once | INTRAMUSCULAR | Status: AC
  Administered 2012-03-28: 10 mg via INTRAVENOUS

## 2012-03-28 MED ORDER — SODIUM CHLORIDE 0.9 % IV SOLN
500.0000 mg/m2 | Freq: Once | INTRAVENOUS | Status: AC
  Administered 2012-03-28: 925 mg via INTRAVENOUS
  Filled 2012-03-28: qty 37

## 2012-03-28 MED ORDER — ONDANSETRON 8 MG/50ML IVPB (CHCC)
8.0000 mg | Freq: Once | INTRAVENOUS | Status: AC
  Administered 2012-03-28: 8 mg via INTRAVENOUS

## 2012-03-28 NOTE — Progress Notes (Signed)
[facility]  [phone] [phone]   OFFICE PROGRESS NOTE   Cc:  CHAMBERLAIN,RACHEL, MD  PAST DIAGNOSIS AND PAST THERAPY:  1. 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 Her post resection surveillance colonoscopy onOctober 14, 2009, by Dr. Christella Hartigan was negative. 2. pT1a, pN0 well differentiated adenocarcinoma of lung; s/p left upper lobectomy on 06/16/2010. 3. 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  4. 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  She underwent stereotactic radiosurgery.  The left occipital 15mm target was treated using 4 Dynamic Conformal Arcs to a prescription dose of 20 Gy in 1 fraction. 6 MV photons, flattening filter free, were used. The right insula 4mm target was treated using 3 Circular Arcs to a prescription dose of 20 Gy in 1 fraction.  6 MV photons, flattening filter free, were used  CURRENT THERAPY: Maintenance chemo Alimta q3 weeks started on 03/07/2012.    INTERVAL HISTORY: Lauren [name] 61 y.o. female returns for regular follow-up with her daughter. Started Alimta 3 weeks ago. She noticed some mild to moderate fatigue.  She is still able to live by herself and is independent of all activities of daily living. Appetite and weight have stabilized. She denied headache, confusion, seizure, visual changes, cough, SOB, DOE, hemoptysis, bone pain, jaundice, back pain, leg weakness/paresthesia, bowel/bladder incontinence.    Past Medical History  Diagnosis Date  . Depression   . Hyperlipidemia   . Hypertension   . Colon cancer 11/08  . Lung cancer 06/16/10    PET scan 04/28/2010; primary: increase in size 02/2010  .  Lung nodule     FNA ordered for 04/02/10 by HA>pos Ca  . Chronic folliculitis     of groin  . Fibrocystic breast changes   . Family history of trichomonal vaginitis 05/2005  . Postmenopausal   . Depressive disorder   . Hypercholesterolemia   . GERD (gastroesophageal reflux disease)   . Cerebral aneurysm   . Back pain   . Shortness of breath   . COPD (chronic obstructive pulmonary disease)   . Coronary artery disease   . Arthritis   . Weight loss 10/22/2011  . Status post radiation therapy 11/16/11 - 12/29/11    Right Lung and Mediastinum: 60 Gy  . Status post chemotherapy comp. 12/29/11    Carboplatin/Taxol  . Brain metastases 02/15/12    Past Surgical History  Procedure Date  . Colectomy 03/22/07  . Tubal ligation   . Lung lobectomy 06/16/10  . Hernia repair   . Breast surgery     Bil lumpectomy  . Back surgery   . Mediastinoscopy 10/19/2011    Procedure: MEDIASTINOSCOPY;  Surgeon: Alleen Borne, MD;  Location: [facility] OR;  Service: Thoracic;  Laterality: N/A;  . Cardiac cath x3     Current Outpatient Prescriptions  Medication Sig Dispense Refill  . albuterol (PROVENTIL HFA;VENTOLIN HFA) 108 (90 BASE) MCG/ACT inhaler Inhale 2 puffs into the lungs every 6 (six) hours as needed. For shortness of breath      . aspirin 81 MG EC tablet Take 81 mg by mouth daily.      . cholecalciferol (VITAMIN D) 1000 UNITS tablet Take 1,000 Units by mouth daily.      Marland Kitchen  cloNIDine (CATAPRES) 0.1 MG tablet Take 0.1 mg by mouth at bedtime.      . diclofenac sodium (VOLTAREN) 1 % GEL Apply 1 application topically 4 (four) times daily. As needed for pain      . folic acid (FOLVITE) 1 MG tablet Take 1 tablet (1 mg total) by mouth daily.  30 tablet  2  . hydrochlorothiazide (HYDRODIURIL) 25 MG tablet Take 25 mg by mouth daily.      . hydrOXYzine (ATARAX/VISTARIL) 10 MG tablet Take 10 mg by mouth daily as needed. For itching      . nebivolol (BYSTOLIC) 10 MG tablet Take 10 mg by mouth daily.      . nitroGLYCERIN  (NITROSTAT) 0.4 MG SL tablet Place 0.4 mg under the tongue every 5 (five) minutes as needed. For chest pain      . omeprazole (PRILOSEC) 20 MG capsule Take 20 mg by mouth daily.        Marland Kitchen oxyCODONE-acetaminophen (PERCOCET/ROXICET) 5-325 MG per tablet Take 1-2 tablets by mouth every 6 (six) hours as needed for pain.  20 tablet  0  . pravastatin (PRAVACHOL) 40 MG tablet Take 40 mg by mouth daily.       . traMADol (ULTRAM) 50 MG tablet Take 50 mg by mouth every 6 (six) hours as needed. As needed for pain.      . [DISCONTINUED] buPROPion (WELLBUTRIN XL) 150 MG 24 hr tablet Take 1 tablet (150 mg total) by mouth 2 (two) times daily.  60 tablet  5   No current facility-administered medications for this visit.   Facility-Administered Medications Ordered in Other Visits  Medication Dose Route Frequency Provider Last Rate Last Dose  . [COMPLETED] 0.9 %  sodium chloride infusion   Intravenous Once Exie Parody, MD 20 mL/hr at 03/28/12 1541    . [COMPLETED] dexamethasone (DECADRON) injection 10 mg  10 mg Intravenous Once Exie Parody, MD   10 mg at 03/28/12 1543  . [COMPLETED] ondansetron (ZOFRAN) IVPB 8 mg  8 mg Intravenous Once Exie Parody, MD   8 mg at 03/28/12 1543  . [COMPLETED] PEMEtrexed (ALIMTA) 925 mg in sodium chloride 0.9 % 100 mL chemo infusion  500 mg/m2 (Treatment Plan Actual) Intravenous Once Exie Parody, MD   925 mg at 03/28/12 1612    ALLERGIES:  is allergic to lisinopril.  REVIEW OF SYSTEMS:  The rest of the 14-point review of system was negative.   Filed Vitals:   03/28/12 1354  BP: 123/68  Pulse: 78  Temp: 98.6 F (37 C)  Resp: 20   Wt Readings from Last 3 Encounters:  03/28/12 164 lb 12.8 oz (74.753 kg)  03/21/12 161 lb (73.029 kg)  03/03/12 163 lb (73.936 kg)   ECOG Performance status: 1  PHYSICAL EXAMINATION:   General:  Thin-appearing woman, in no acute distress.  Eyes:  no scleral icterus.  ENT:  There were no oropharyngeal lesions.  Neck was without thyromegaly.  Lymphatics:   Negative cervical, supraclavicular or axillary adenopathy.  Respiratory: lungs were clear bilaterally without wheezing or crackles.  Cardiovascular:  Regular rate and rhythm, S1/S2, without murmur, rub or gallop.  There was no pedal edema.  GI:  abdomen was soft, flat, nontender, nondistended, without organomegaly.  Muscoloskeletal:  no spinal tenderness of palpation of vertebral spine.  Skin exam was without echymosis, petichae.  Neuro exam was nonfocal. PERRL, EOMI, CN II-XII grossly intact.  There was no dysmetria.  Patient was able to get on  and off exam table without assistance.  Gait was normal.  Patient was alerted and oriented.  Attention was good.   Language was appropriate.  Mood was normal without depression.  Speech was not pressured.  Thought content was not tangential.     LABORATORY/RADIOLOGY DATA:  Lab Results  Component Value Date   WBC 2.8* 03/28/2012   HGB 12.5 03/28/2012   HCT 38.4 03/28/2012   PLT 178 03/28/2012   GLUCOSE 195* 03/03/2012   CHOL 198 03/21/2012   TRIG 104 03/21/2012   HDL 53 03/21/2012   LDLDIRECT 111* 02/27/2010   LDLCALC 124* 03/21/2012   ALKPHOS 81 03/03/2012   ALT 33 03/03/2012   AST 14 03/03/2012   NA 134* 03/03/2012   K 3.8 03/03/2012   CL 99 03/03/2012   CREATININE 0.9 03/03/2012   BUN 27.0* 03/03/2012   CO2 25 03/03/2012   INR 1.10 10/19/2011     ASSESSMENT AND PLAN:   1. Recurrent Lung cancer now with brain met:  - s/p brain radiation.  - Now on maintenance chemo with Alimta. She has grade 1 fatigue. Recommend that she proceed with cycle 2 of the Alimta today without dose modification. - She is now off Decadron and Keppra.  2. History of colon cancer: She has no evidence of recurrent or metastatic disease for her colon cancer. Surveillance  for her history of colon cancer entails once-a-year CT scan for 5 years' total, with the next CT abdomen in Sep 2014 since her CT abdomen/pelvis this time was negative. She had a surveillance colonoscopy  in October 2012 by Dr Christella Hartigan, which showed polyps. Per his recommendation, the next colonoscopy is due in Oct 2015 but sooner if she has symptoms.   3. Smoking: She has started smoking tobacco again.  4. Hypertension: She is on nebivolol, Catapres, HCTZ and per PCP.   5. Hypercholesterolemia: She is on pravastatin per PCP.   7. Anorexia: Due to new diagnosis of recurrent lung cancer and recent brain radiation.  Appetite and weight are stable today.  8. Follow up: in about 3 weeks prior to cycle#3 of Alimta.  9. CODE STATUS: she decided to be FULL CODE with previous discussion.  We will address this again when her clinical status worsens.      The length of time of the face-to-face encounter was 25  minutes. More than 50% of time was spent counseling and coordination of care.

## 2012-03-28 NOTE — Patient Instructions (Addendum)
[facility] Discharge Instructions for Patients Receiving Chemotherapy  Today you received the following chemotherapy agents Alimta.  To help prevent nausea and vomiting after your treatment, we encourage you to take your nausea medication as prescribed.   If you develop nausea and vomiting that is not controlled by your nausea medication, call the clinic. If it is after clinic hours your family physician or the after hours number for the clinic or go to the Emergency Department.   BELOW ARE SYMPTOMS THAT SHOULD BE REPORTED IMMEDIATELY:  *FEVER GREATER THAN 100.5 F  *CHILLS WITH OR WITHOUT FEVER  NAUSEA AND VOMITING THAT IS NOT CONTROLLED WITH YOUR NAUSEA MEDICATION  *UNUSUAL SHORTNESS OF BREATH  *UNUSUAL BRUISING OR BLEEDING  TENDERNESS IN MOUTH AND THROAT WITH OR WITHOUT PRESENCE OF ULCERS  *URINARY PROBLEMS  *BOWEL PROBLEMS  UNUSUAL RASH Items with * indicate a potential emergency and should be followed up as soon as possible.  One of the nurses will contact you 24 hours after your treatment. Please let the nurse know about any problems that you may have experienced. Feel free to call the clinic you have any questions or concerns. The clinic phone number is [phone].    

## 2012-03-28 NOTE — Telephone Encounter (Signed)
Message copied by Wende Mott on Mon Mar 28, 2012  5:05 PM ------      Message from: Clenton Pare R      Created: Mon Mar 28, 2012  4:51 PM      Regarding: abnormal lab       Call pt. Hold HCTZ. Repeat stat BMET tomorrow.            POF sent to scheduler.                  ----- Message -----         From: Exie Parody, MD         Sent: 03/28/2012   4:49 PM           To: Myrtis Ser, NP            Please replete both IV and oral.  Is she having diarrhea, vomiting?            Thanks.      ----- Message -----         From: [facility] Interface         Sent: 03/28/2012   1:46 PM           To: Exie Parody, MD

## 2012-03-28 NOTE — Telephone Encounter (Signed)
Called pt and informed of low potassium and need to recheck it tomorrow.  She denies any diarrhea or vomiting.  Instructed to hold HCTZ until further orders after lab tomorrow.   Also informed of order for PAC due to difficult IV sticks today.  Pt agreed.

## 2012-03-29 ENCOUNTER — Other Ambulatory Visit (HOSPITAL_BASED_OUTPATIENT_CLINIC_OR_DEPARTMENT_OTHER): Payer: Medicare Other | Admitting: Lab

## 2012-03-29 ENCOUNTER — Other Ambulatory Visit: Payer: Self-pay | Admitting: Certified Registered Nurse Anesthetist

## 2012-03-29 ENCOUNTER — Other Ambulatory Visit: Payer: Self-pay | Admitting: Oncology

## 2012-03-29 ENCOUNTER — Telehealth: Payer: Self-pay | Admitting: Oncology

## 2012-03-29 ENCOUNTER — Other Ambulatory Visit: Payer: Self-pay | Admitting: Lab

## 2012-03-29 ENCOUNTER — Telehealth: Payer: Self-pay | Admitting: *Deleted

## 2012-03-29 DIAGNOSIS — E876 Hypokalemia: Secondary | ICD-10-CM

## 2012-03-29 DIAGNOSIS — C3490 Malignant neoplasm of unspecified part of unspecified bronchus or lung: Secondary | ICD-10-CM

## 2012-03-29 LAB — BASIC METABOLIC PANEL (CC13)
Calcium: 9.6 mg/dL (ref 8.4–10.4)
Creatinine: 0.7 mg/dL (ref 0.6–1.1)
Glucose: 119 mg/dl — ABNORMAL HIGH (ref 70–99)
Sodium: 141 mEq/L (ref 136–145)

## 2012-03-29 MED ORDER — POTASSIUM CHLORIDE CRYS ER 20 MEQ PO TBCR: 20.0000 meq | EXTENDED_RELEASE_TABLET | Freq: Every day | ORAL | Status: DC

## 2012-03-29 NOTE — Telephone Encounter (Signed)
lvm for pt regarding monday appt

## 2012-03-29 NOTE — Telephone Encounter (Signed)
s.w. pt and reconfirmed todays appt for lab...Marland Kitchenpt was aware   By Collier Salina

## 2012-03-29 NOTE — Telephone Encounter (Signed)
Patient called from lobby asking about potasium level.  Asked if she is to continue HCTZ.  Lab drawn today and results are in.  Spoke with Clenton Pare PA and received orders and read back for Ms. Vader not to take the HCTZ, to begin K-Dur 20 meq daily and to expect a call from scheduler to have lab on next appt day 04-04-2012.  Patient denies questions and asked to have prescription called to [facility].  Called [facility] and phone order given.

## 2012-04-04 ENCOUNTER — Other Ambulatory Visit: Payer: Self-pay | Admitting: Radiation Therapy

## 2012-04-04 ENCOUNTER — Other Ambulatory Visit (HOSPITAL_BASED_OUTPATIENT_CLINIC_OR_DEPARTMENT_OTHER): Payer: Medicare Other

## 2012-04-04 ENCOUNTER — Encounter: Payer: Self-pay | Admitting: Radiation Oncology

## 2012-04-04 ENCOUNTER — Ambulatory Visit
Admission: RE | Admit: 2012-04-04 | Discharge: 2012-04-04 | Disposition: A | Discharge status: Home / Self Care | Payer: Medicare Other | Source: Ambulatory Visit | Attending: Radiation Oncology | Admitting: Radiation Oncology

## 2012-04-04 VITALS — BP 117/73 | HR 92 | Temp 98.9°F | Resp 20 | Wt 168.4 lb

## 2012-04-04 DIAGNOSIS — C7949 Secondary malignant neoplasm of other parts of nervous system: Secondary | ICD-10-CM

## 2012-04-04 DIAGNOSIS — C7931 Secondary malignant neoplasm of brain: Secondary | ICD-10-CM

## 2012-04-04 DIAGNOSIS — E876 Hypokalemia: Secondary | ICD-10-CM

## 2012-04-04 LAB — BASIC METABOLIC PANEL (CC13)
BUN: 9 mg/dL (ref 7.0–26.0)
Chloride: 107 mEq/L (ref 98–107)
Potassium: 3.9 mEq/L (ref 3.5–5.1)

## 2012-04-04 NOTE — Progress Notes (Signed)
Patient for follow up SRS brain treatment completed on 03/01/12.Denies headache or nausea.Has some left lower leg swelling since stopping hctz.Pain of left foot.Completed dexamethasone taper and keppra has also been discontinued.

## 2012-04-04 NOTE — Progress Notes (Signed)
Radiation Oncology         [phone] ________________________________  Name: Lauren [name] MRN: [id number]  Date: 04/04/2012  DOB: [birth year]  Follow-Up Visit Note  CC: Ardyth Gal, MD  Ardyth Gal, MD  Diagnosis:   Metastatic non-small cell lung cancer, adenocarcinoma, with 2 brain metastases. Left occipital and right insular metastases  Interval Since Last Radiation: She completed 20 Gray in 1 fraction to both of the metastases on 03/01/2012  Narrative:  The patient returns today for routine follow-up.  She is doing relatively well. She denies any headaches or nausea or seizures or focal neurologic deficits. Her vision is stable. She does have generalized weakness. She is going of a Port-A-Cath placed this week due to difficulty with chemotherapy infusions. She has been receiving Alimta.   She is tapered off Keppra and dexamethasone.                      ALLERGIES:  is allergic to lisinopril.  Meds: Current Outpatient Prescriptions  Medication Sig Dispense Refill  . albuterol (PROVENTIL HFA;VENTOLIN HFA) 108 (90 BASE) MCG/ACT inhaler Inhale 2 puffs into the lungs every 6 (six) hours as needed. For shortness of breath      . aspirin 81 MG EC tablet Take 81 mg by mouth daily.      . cholecalciferol (VITAMIN D) 1000 UNITS tablet Take 1,000 Units by mouth daily.      . cloNIDine (CATAPRES) 0.1 MG tablet Take 0.1 mg by mouth at bedtime.      . diclofenac sodium (VOLTAREN) 1 % GEL Apply 1 application topically 4 (four) times daily. As needed for pain      . nebivolol (BYSTOLIC) 10 MG tablet Take 10 mg by mouth daily.      . nitroGLYCERIN (NITROSTAT) 0.4 MG SL tablet Place 0.4 mg under the tongue every 5 (five) minutes as needed. For chest pain      . omeprazole (PRILOSEC) 20 MG capsule Take 20 mg by mouth daily.        . potassium chloride SA (K-DUR,KLOR-CON) 20 MEQ tablet Take 1 tablet (20 mEq total) by mouth daily.  30 tablet  0  . pravastatin (PRAVACHOL) 40 MG  tablet Take 40 mg by mouth daily.       . traMADol (ULTRAM) 50 MG tablet Take 50 mg by mouth every 6 (six) hours as needed. As needed for pain.      . folic acid (FOLVITE) 1 MG tablet Take 1 tablet (1 mg total) by mouth daily.  30 tablet  2  . hydrochlorothiazide (HYDRODIURIL) 25 MG tablet Take 25 mg by mouth daily.      Marland Kitchen oxyCODONE-acetaminophen (PERCOCET/ROXICET) 5-325 MG per tablet Take 1-2 tablets by mouth every 6 (six) hours as needed for pain.  20 tablet  0  . [DISCONTINUED] buPROPion (WELLBUTRIN XL) 150 MG 24 hr tablet Take 1 tablet (150 mg total) by mouth 2 (two) times daily.  60 tablet  5    Physical Findings: The patient is in no acute distress. Patient is alert and oriented.  weight is 168 lb 6.4 oz (76.386 kg). Her temperature is 98.9 F (37.2 C). Her blood pressure is 117/73 and her pulse is 92. Her respiration is 20. Marland Kitchen   Sitting comfortably in a chair. Extraocular movements are intact. No cranial nerve deficits. Oropharynx demonstrates no thrush. She does wear dentures. Her strength is intact in all extremities. She mobilized is with a cane. Speech is fluent. She  is alert and oriented. No hyperreflexia. She does have pedal edema bilaterally.  Lab Findings: Lab Results  Component Value Date   WBC 2.8* 03/28/2012   HGB 12.5 03/28/2012   HCT 38.4 03/28/2012   MCV 83.3 03/28/2012   PLT 178 03/28/2012    CMP     Component Value Date/Time   NA 142 04/04/2012 0845   NA 139 02/15/2012 1159   NA 143 09/28/2011 0833   K 3.9 04/04/2012 0845   K 3.1* 02/15/2012 1159   K 3.5 09/28/2011 0833   CL 107 04/04/2012 0845   CL 102 02/15/2012 1159   CL 98 09/28/2011 0833   CO2 26 04/04/2012 0845   CO2 26 02/15/2012 1159   CO2 30 09/28/2011 0833   GLUCOSE 95 04/04/2012 0845   GLUCOSE 104* 02/15/2012 1159   GLUCOSE 112 09/28/2011 0833   BUN 9.0 04/04/2012 0845   BUN 10 02/15/2012 1159   BUN 9 09/28/2011 0833   CREATININE 0.7 04/04/2012 0845   CREATININE 0.67 02/15/2012 1159   CREATININE 0.7  09/28/2011 0833   CALCIUM 9.2 04/04/2012 0845   CALCIUM 9.3 02/15/2012 1159   CALCIUM 8.7 09/28/2011 0833   PROT 6.3* 03/28/2012 1322   PROT 7.2 02/15/2012 1159   PROT 7.2 09/28/2011 0833   ALBUMIN 2.7* 03/28/2012 1322   ALBUMIN 3.4* 02/15/2012 1159   AST 16 03/28/2012 1322   AST 12 02/15/2012 1159   AST 15 09/28/2011 0833   ALT 23 03/28/2012 1322   ALT 5 02/15/2012 1159   ALKPHOS 97 03/28/2012 1322   ALKPHOS 119* 02/15/2012 1159   ALKPHOS 99* 09/28/2011 0833   BILITOT 0.65 03/28/2012 1322   BILITOT 0.4 02/15/2012 1159   BILITOT 0.80 09/28/2011 0833   GFRNONAA >90 02/15/2012 1159   GFRAA >90 02/15/2012 1159     Radiographic Findings: No results found.  Impression/Plan: Doing well. I recommend followup MRI of her brain and a visit with neurosurgery in 2 months. We will arrange this for her. She's been encouraged to call if she has any issues in the interim. She'll continue Alimta. She has had some edema in her lower extremities since stopping hydrochlorothiazide. This does cause pain in her feet. 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 In the meantime she'll keep her feet elevated at home.   _____________________________________   Lonie Peak, MD

## 2012-04-04 NOTE — Progress Notes (Signed)
Patient called today c/o swollen ankles and feet; patient is to restart HCTZ, per Dr. Gaylyn Rong; patient states that she is currently taking KCL daily.

## 2012-04-04 NOTE — Patient Instructions (Signed)
Call if questions in the future. MRI of brain in 2 mo. Your follow-up with neurosurgery will be scheduled for 2 mo from now as well.

## 2012-04-05 ENCOUNTER — Encounter (HOSPITAL_COMMUNITY): Payer: Self-pay

## 2012-04-06 ENCOUNTER — Telehealth: Payer: Self-pay | Admitting: Family Medicine

## 2012-04-06 ENCOUNTER — Encounter: Payer: Self-pay | Admitting: Family Medicine

## 2012-04-06 NOTE — Telephone Encounter (Signed)
Called patient to discuss lab results:  HIV/RPR, GC/Chlamydia, Lipids all OK  Pap result was ACS-US, high risk HPV +.  Current guidelines recommend Colposcopy. This patient is currently undergoing chemotherapy for Lung Cancer, and due to risk of immunocompromised state, I told her I felt this needed to be done soon.  I scheduled her to see Dr. Jennette Kettle in Colposcopy clinic on 04/21/12 at 10:30 am.   [facility] 04/06/2012 8:52 AM

## 2012-04-06 NOTE — Progress Notes (Signed)
Patient ID: Lauren [name], female   DOB: 01/13/1951, 61 y.o.   MRN: [id number] Pt with ASCUS undergoing lung cancer treatment. I can see in [facility] clinic and do colpo.

## 2012-04-07 ENCOUNTER — Other Ambulatory Visit: Payer: Self-pay | Admitting: Radiology

## 2012-04-07 NOTE — Telephone Encounter (Signed)
Dear Fleet Contras and Windy Fast,  She is on Alimta chemo once every 3 weeks.  It should not cause much bone marrow suppression, but this chemo is maintenance anyway.  So, I can hold it before and after surgery.  Please let me know of the date of colposcopy and I can arrange her chemo around this date.  Thanks.  Huan.

## 2012-04-08 ENCOUNTER — Telehealth: Payer: Self-pay

## 2012-04-08 NOTE — Telephone Encounter (Signed)
Message copied by Kallie Locks on Fri Apr 08, 2012  4:05 PM ------      Message from: Clenton Pare R      Created: Mon Apr 04, 2012 12:17 PM       Please call pt. Potassium level is normal. Recommend that she continue Kdur daily. Will recheck K+ level at next visit.

## 2012-04-11 ENCOUNTER — Encounter (HOSPITAL_COMMUNITY): Payer: Self-pay

## 2012-04-11 ENCOUNTER — Ambulatory Visit (HOSPITAL_COMMUNITY)
Admission: RE | Admit: 2012-04-11 | Discharge: 2012-04-11 | Disposition: A | Discharge status: Home / Self Care | Payer: Medicare Other | Source: Ambulatory Visit | Attending: Oncology | Admitting: Oncology

## 2012-04-11 ENCOUNTER — Other Ambulatory Visit: Payer: Self-pay | Admitting: Oncology

## 2012-04-11 ENCOUNTER — Other Ambulatory Visit: Payer: Self-pay | Admitting: Family Medicine

## 2012-04-11 DIAGNOSIS — I2510 Atherosclerotic heart disease of native coronary artery without angina pectoris: Secondary | ICD-10-CM | POA: Insufficient documentation

## 2012-04-11 DIAGNOSIS — E7800 Pure hypercholesterolemia, unspecified: Secondary | ICD-10-CM | POA: Insufficient documentation

## 2012-04-11 DIAGNOSIS — E785 Hyperlipidemia, unspecified: Secondary | ICD-10-CM | POA: Insufficient documentation

## 2012-04-11 DIAGNOSIS — C3490 Malignant neoplasm of unspecified part of unspecified bronchus or lung: Secondary | ICD-10-CM | POA: Insufficient documentation

## 2012-04-11 DIAGNOSIS — C7931 Secondary malignant neoplasm of brain: Secondary | ICD-10-CM | POA: Insufficient documentation

## 2012-04-11 DIAGNOSIS — K219 Gastro-esophageal reflux disease without esophagitis: Secondary | ICD-10-CM | POA: Insufficient documentation

## 2012-04-11 DIAGNOSIS — J449 Chronic obstructive pulmonary disease, unspecified: Secondary | ICD-10-CM | POA: Insufficient documentation

## 2012-04-11 DIAGNOSIS — Z923 Personal history of irradiation: Secondary | ICD-10-CM | POA: Insufficient documentation

## 2012-04-11 DIAGNOSIS — Z79899 Other long term (current) drug therapy: Secondary | ICD-10-CM | POA: Insufficient documentation

## 2012-04-11 DIAGNOSIS — F17200 Nicotine dependence, unspecified, uncomplicated: Secondary | ICD-10-CM | POA: Insufficient documentation

## 2012-04-11 DIAGNOSIS — Z9221 Personal history of antineoplastic chemotherapy: Secondary | ICD-10-CM | POA: Insufficient documentation

## 2012-04-11 DIAGNOSIS — Z85038 Personal history of other malignant neoplasm of large intestine: Secondary | ICD-10-CM | POA: Insufficient documentation

## 2012-04-11 DIAGNOSIS — I10 Essential (primary) hypertension: Secondary | ICD-10-CM | POA: Insufficient documentation

## 2012-04-11 DIAGNOSIS — J4489 Other specified chronic obstructive pulmonary disease: Secondary | ICD-10-CM | POA: Insufficient documentation

## 2012-04-11 DIAGNOSIS — Z7982 Long term (current) use of aspirin: Secondary | ICD-10-CM | POA: Insufficient documentation

## 2012-04-11 LAB — CBC WITH DIFFERENTIAL/PLATELET
Basophils Absolute: 0 10*3/uL (ref 0.0–0.1)
Basophils Relative: 0 % (ref 0–1)
Eosinophils Absolute: 0 10*3/uL (ref 0.0–0.7)
Eosinophils Relative: 0 % (ref 0–5)
HCT: 37.3 % (ref 36.0–46.0)
Hemoglobin: 12.2 g/dL (ref 12.0–15.0)
MCH: 26.6 pg (ref 26.0–34.0)
MCHC: 32.7 g/dL (ref 30.0–36.0)
MCV: 81.3 fL (ref 78.0–100.0)
Monocytes Absolute: 0.7 10*3/uL (ref 0.1–1.0)
Monocytes Relative: 10 % (ref 3–12)
RDW: 15.8 % — ABNORMAL HIGH (ref 11.5–15.5)

## 2012-04-11 MED ORDER — FENTANYL CITRATE 0.05 MG/ML IJ SOLN
INTRAMUSCULAR | Status: AC
  Filled 2012-04-11: qty 4

## 2012-04-11 MED ORDER — HEPARIN SOD (PORK) LOCK FLUSH 100 UNIT/ML IV SOLN
500.0000 [IU] | Freq: Once | INTRAVENOUS | Status: AC
  Administered 2012-04-11: 500 [IU] via INTRAVENOUS

## 2012-04-11 MED ORDER — SODIUM CHLORIDE 0.9 % IV SOLN: INTRAVENOUS | Status: DC

## 2012-04-11 MED ORDER — MIDAZOLAM HCL 2 MG/2ML IJ SOLN
INTRAMUSCULAR | Status: AC
  Filled 2012-04-11: qty 4

## 2012-04-11 MED ORDER — FENTANYL CITRATE 0.05 MG/ML IJ SOLN
INTRAMUSCULAR | Status: AC | PRN
  Administered 2012-04-11: 100 ug via INTRAVENOUS

## 2012-04-11 MED ORDER — CEFAZOLIN SODIUM 1-5 GM-% IV SOLN
1.0000 g | Freq: Once | INTRAVENOUS | Status: AC
  Administered 2012-04-11: 1 g via INTRAVENOUS
  Filled 2012-04-11: qty 50

## 2012-04-11 MED ORDER — MIDAZOLAM HCL 2 MG/2ML IJ SOLN
INTRAMUSCULAR | Status: AC | PRN
  Administered 2012-04-11: 2 mg via INTRAVENOUS

## 2012-04-11 NOTE — Procedures (Signed)
Successful placement of right IJ approach port-a-cath with tip at superior caval-atrial junction. The catheter is ready for immediate use. No immediate post procedural complications.  

## 2012-04-11 NOTE — H&P (Signed)
Chief Complaint: "I'm here for a portacath" Referring Physician:Ha HPI: Lauren [name] is an 61 y.o. female with hx of colon cancer and metastatic lung cancer She is currently receiving chemotherapy but having trouble with IV access. She is referred for port.  Past Medical History:  Past Medical History  Diagnosis Date  . Depression   . Hyperlipidemia   . Hypertension   . Colon cancer 11/08  . Lung cancer 06/16/10    PET scan 04/28/2010; primary: increase in size 02/2010  . Lung nodule     FNA ordered for 04/02/10 by HA>pos Ca  . Chronic folliculitis     of groin  . Fibrocystic breast changes   . Family history of trichomonal vaginitis 05/2005  . Postmenopausal   . Depressive disorder   . Hypercholesterolemia   . GERD (gastroesophageal reflux disease)   . Cerebral aneurysm   . Back pain   . Shortness of breath   . COPD (chronic obstructive pulmonary disease)   . Coronary artery disease   . Arthritis   . Weight loss 10/22/2011  . Status post radiation therapy 11/16/11 - 12/29/11    Right Lung and Mediastinum: 60 Gy  . Status post chemotherapy comp. 12/29/11    Carboplatin/Taxol  . Brain metastases 02/15/12    Past Surgical History:  Past Surgical History  Procedure Date  . Colectomy 03/22/07  . Tubal ligation   . Lung lobectomy 06/16/10  . Hernia repair   . Breast surgery     Bil lumpectomy  . Back surgery   . Mediastinoscopy 10/19/2011    Procedure: MEDIASTINOSCOPY;  Surgeon: Alleen Borne, MD;  Location: [facility] OR;  Service: Thoracic;  Laterality: N/A;  . Cardiac cath x3     Family History:  Family History  Problem Relation Age of Onset  . Stomach cancer Maternal Aunt   . Breast cancer Cousin   . Cancer Sister     Lymphatic  . Osteoarthritis Father   . Gout Father   . Hypertension Father   . Heart disease Mother     pericarditis;   . Anesthesia problems Neg Hx     Social History:  reports that she has been smoking Cigarettes.  She has been smoking about 1.5  packs per day. She uses smokeless tobacco. She reports that she drinks alcohol. She reports that she does not use illicit drugs.  Allergies:  Allergies  Allergen Reactions  . Lisinopril     REACTION: cough    Medications: albuterol (PROVENTIL HFA;VENTOLIN HFA) 108 (90 BASE) MCG/ACT inhaler 10/05/2011 10/04/2012 Sig - Route: Inhale 2 puffs into the lungs every 6 (six) hours as needed. For shortness of breath - Inhalation Class: Historical Med Number of times this order has been changed since signing: 1 Order [address] aspirin 81 MG EC tablet 12/02/2010 Sig - Route: Take 81 mg by mouth every morning. - Oral Class: Historical Med Number of times this order has been changed since signing: 2 Order [address] cholecalciferol (VITAMIN D) 1000 UNITS tablet Sig - Route: Take 1,000 Units by mouth daily. - Oral Class: Historical Med Number of times this order has been changed since signing: 1 Order [address] cloNIDine (CATAPRES) 0.1 MG tablet 07/27/2011 Sig - Route: Take 0.1 mg by mouth at bedtime. - Oral Class: Historical Med diclofenac sodium (VOLTAREN) 1 % GEL 07/27/2011 Sig - Route: Apply 1 application topically 4 (four) times daily. As needed for pain - Topical Class: Historical Med folic acid (FOLVITE) 1 MG  tablet 30 tablet 2 02/19/2012 Sig - Route: Take 1 tablet (1 mg total) by mouth daily. - Oral hydrochlorothiazide (HYDRODIURIL) 25 MG tablet 07/27/2011 Sig - Route: Take 25 mg by mouth every morning. - Oral Class: Historical Med Number of times this order has been changed since signing: 2 Order [address] nebivolol (BYSTOLIC) 10 MG tablet 07/27/2011 Sig - Route: Take 10 mg by mouth every morning. - Oral Class: Historical Med Number of times this order has been changed since signing: 2 Order [address] nitroGLYCERIN (NITROSTAT) 0.4 MG SL tablet 12/02/2010 Sig - Route: Place 0.4 mg under the tongue every 5 (five) minutes as needed. For chest pain - Sublingual Class: Historical Med Number of times this order has  been changed since signing: 1 Order [address] omeprazole (PRILOSEC) 20 MG capsule Sig - Route: Take 20 mg by mouth daily. - Oral Class: Historical Med Number of times this order has been changed since signing: 1 Order [address] oxyCODONE-acetaminophen (PERCOCET/ROXICET) 5-325 MG per tablet 20 tablet 0 02/15/2012 Sig - Route: Take 1-2 tablets by mouth every 6 (six) hours as needed for pain. - Oral Class: Print potassium chloride SA (K-DUR,KLOR-CON) 20 MEQ tablet 30 tablet 0 03/29/2012 Sig - Route: Take 1 tablet (20 mEq total) by mouth daily. - Oral Class: Phone In pravastatin (PRAVACHOL) 40 MG tablet Sig - Route: Take 40 mg by mouth every morning. - Oral Class: Historical Med Number of times this order has been changed since signing: 3 Order [address] traMADol (ULTRAM) 50 MG tablet 07/27/2011 07/26/2012 Sig - Route: Take 50 mg by mouth every 6 (six) hours as needed. As needed for pain. - Oral Class: Historical Med zolpidem (AMBIEN) 5 MG tablet Sig - Route: Take 5 mg by mouth at bedtime as needed. Sleep    Please HPI for pertinent positives, otherwise complete 10 system ROS negative.  Physical Exam: There were no vitals taken for this visit. There is no height or weight on file to calculate BMI.   General Appearance:  Alert, cooperative, no distress, appears stated age  Head:  Normocephalic, without obvious abnormality, atraumatic  ENT: Unremarkable  Neck: Supple, symmetrical, trachea midline, no adenopathy, thyroid: not enlarged, symmetric, no tenderness/mass/nodules  Lungs:   Clear to auscultation bilaterally, no w/r/r, respirations unlabored without use of accessory muscles.  Chest Wall:  No tenderness or deformity, well healed small mediastinoscopy scar.  Heart:  Regular rate and rhythm, S1, S2 normal, no murmur, rub or gallop. Carotids 2+ without bruit.  Neurologic: Normal affect, no gross deficits.   No results found for this or any previous visit (from the past 48 hour(s)). No results  found.  Assessment/Plan Metastatic lung cancer Discussed port placement procedure and risks. Labs pending Consent signed in chart  Brayton El PA-C 04/11/2012, 10:22 AM

## 2012-04-18 ENCOUNTER — Ambulatory Visit (HOSPITAL_BASED_OUTPATIENT_CLINIC_OR_DEPARTMENT_OTHER): Payer: Medicare Other | Admitting: Oncology

## 2012-04-18 ENCOUNTER — Ambulatory Visit: Payer: Self-pay

## 2012-04-18 ENCOUNTER — Other Ambulatory Visit (HOSPITAL_BASED_OUTPATIENT_CLINIC_OR_DEPARTMENT_OTHER): Payer: Medicare Other | Admitting: Lab

## 2012-04-18 ENCOUNTER — Telehealth: Payer: Self-pay | Admitting: Oncology

## 2012-04-18 VITALS — BP 113/73 | HR 81 | Temp 97.3°F | Resp 20 | Ht 64.0 in | Wt 162.5 lb

## 2012-04-18 DIAGNOSIS — R630 Anorexia: Secondary | ICD-10-CM

## 2012-04-18 DIAGNOSIS — C3410 Malignant neoplasm of upper lobe, unspecified bronchus or lung: Secondary | ICD-10-CM

## 2012-04-18 DIAGNOSIS — C7931 Secondary malignant neoplasm of brain: Secondary | ICD-10-CM

## 2012-04-18 DIAGNOSIS — C3490 Malignant neoplasm of unspecified part of unspecified bronchus or lung: Secondary | ICD-10-CM

## 2012-04-18 DIAGNOSIS — C7949 Secondary malignant neoplasm of other parts of nervous system: Secondary | ICD-10-CM

## 2012-04-18 DIAGNOSIS — F17200 Nicotine dependence, unspecified, uncomplicated: Secondary | ICD-10-CM

## 2012-04-18 DIAGNOSIS — Z85038 Personal history of other malignant neoplasm of large intestine: Secondary | ICD-10-CM

## 2012-04-18 LAB — CBC WITH DIFFERENTIAL/PLATELET
BASO%: 0.4 % (ref 0.0–2.0)
Basophils Absolute: 0 10*3/uL (ref 0.0–0.1)
EOS%: 1.6 % (ref 0.0–7.0)
HGB: 12 g/dL (ref 11.6–15.9)
MCH: 26.2 pg (ref 25.1–34.0)
MCHC: 32 g/dL (ref 31.5–36.0)
MCV: 81.9 fL (ref 79.5–101.0)
MONO%: 12.7 % (ref 0.0–14.0)
RDW: 16.1 % — ABNORMAL HIGH (ref 11.2–14.5)
lymph#: 0.9 10*3/uL (ref 0.9–3.3)

## 2012-04-18 MED ORDER — LIDOCAINE-PRILOCAINE 2.5-2.5 % EX CREA: TOPICAL_CREAM | CUTANEOUS | Status: DC | PRN

## 2012-04-18 NOTE — Progress Notes (Signed)
[facility]  Telephone:([phone]) [phone] [phone]   OFFICE PROGRESS NOTE   Cc:  CHAMBERLAIN,RACHEL, MD  PAST DIAGNOSIS AND PAST THERAPY:  1. 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 Her post resection surveillance colonoscopy onOctober 14, 2009, by Dr. Christella Hartigan was negative. 2. pT1a, pN0 well differentiated adenocarcinoma of lung; s/p left upper lobectomy on 06/16/2010. 3. 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  4. 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  She underwent stereotactic radiosurgery.  The left occipital 15mm target was treated using 4 Dynamic Conformal Arcs to a prescription dose of 20 Gy in 1 fraction. 6 MV photons, flattening filter free, were used. The right insula 4mm target was treated using 3 Circular Arcs to a prescription dose of 20 Gy in 1 fraction.  6 MV photons, flattening filter free, were used  CURRENT THERAPY: started maintenance chemo Alimta q3 weeks on 03/07/2012.    INTERVAL HISTORY: Lauren [name] 61 y.o. female returns for regular follow up with her daughter.  She finished brain radiation.  She denied headache, nausea/vomiting, visual changes, fall, gait abnormality.  She has been tolerating chemo Alimita without skin rash, neuropathy, fever, mucositis, infection.  She does have dry skin.  Her appetite is low compared to while she was on steroid.  Her weight was relatively stable subjectively per her report.  She now has been resuming smoking regular cigarettes alternating with electronic cigarettes.  She recently had an abnormal Pap smear and is scheduled for a colposcopy on 04/21/2012 with Dr. Jennette Kettle. Her edema resolved with  resumption of HCTZ.  She denied dizziness with HCTZ.    Patient denies fever, anorexia, weight loss, fatigue, headache, visual changes, confusion, drenching night sweats, palpable lymph node swelling, mucositis, odynophagia, dysphagia, nausea vomiting, jaundice, chest pain, palpitation, shortness of breath, dyspnea on exertion, productive cough, gum bleeding, epistaxis, hematemesis, hemoptysis, abdominal pain, abdominal swelling, early satiety, melena, hematochezia, hematuria, vaginal bleeding, skin rash, spontaneous bleeding, joint swelling, joint pain, heat or cold intolerance, bowel bladder incontinence, back pain, focal motor weakness, paresthesia.     Past Medical History  Diagnosis Date  . Depression   . Hyperlipidemia   . Hypertension   . Colon cancer 11/08  . Lung cancer 06/16/10    PET scan 04/28/2010; primary: increase in size 02/2010  . Lung nodule     FNA ordered for 04/02/10 by Andreea Arca>pos Ca  . Chronic folliculitis     of groin  . Fibrocystic breast changes   . Family history of trichomonal vaginitis 05/2005  . Postmenopausal   . Depressive disorder   . Hypercholesterolemia   . GERD (gastroesophageal reflux disease)   . Cerebral aneurysm   . Back pain   . Shortness of breath   . COPD (chronic obstructive pulmonary disease)   . Coronary artery disease   . Arthritis   . Weight loss 10/22/2011  . Status post radiation therapy 11/16/11 - 12/29/11    Right Lung and Mediastinum: 60 Gy  . Status post chemotherapy comp. 12/29/11    Carboplatin/Taxol  . Brain metastases 02/15/12    Past Surgical History  Procedure Date  . Colectomy 03/22/07  . Tubal ligation   . Lung lobectomy 06/16/10  . Hernia repair   .  Breast surgery     Bil lumpectomy  . Back surgery   . Mediastinoscopy 10/19/2011    Procedure: MEDIASTINOSCOPY;  Surgeon: Alleen Borne, MD;  Location: [facility] OR;  Service: Thoracic;  Laterality: N/A;  . Cardiac cath x3     Current Outpatient Prescriptions  Medication Sig  Dispense Refill  . albuterol (PROVENTIL HFA;VENTOLIN HFA) 108 (90 BASE) MCG/ACT inhaler Inhale 2 puffs into the lungs every 6 (six) hours as needed. For shortness of breath      . aspirin 81 MG EC tablet Take 81 mg by mouth every morning.       Marland Kitchen BYSTOLIC 10 MG tablet TAKE 1 TABLET BY MOUTH ONCE DAILY  30 tablet  5  . cholecalciferol (VITAMIN D) 1000 UNITS tablet Take 1,000 Units by mouth daily.      . cloNIDine (CATAPRES) 0.1 MG tablet Take 0.1 mg by mouth at bedtime.      . diclofenac sodium (VOLTAREN) 1 % GEL Apply 1 application topically 4 (four) times daily. As needed for pain      . folic acid (FOLVITE) 1 MG tablet Take 1 tablet (1 mg total) by mouth daily.  30 tablet  2  . hydrochlorothiazide (HYDRODIURIL) 25 MG tablet Take 25 mg by mouth every morning.       . lidocaine-prilocaine (EMLA) cream Apply topically as needed. Apply to porta cath site one hour prior to needle stick.  30 g  2  . nitroGLYCERIN (NITROSTAT) 0.4 MG SL tablet Place 0.4 mg under the tongue every 5 (five) minutes as needed. For chest pain      . omeprazole (PRILOSEC) 20 MG capsule Take 20 mg by mouth daily.        Marland Kitchen oxyCODONE-acetaminophen (PERCOCET/ROXICET) 5-325 MG per tablet Take 1-2 tablets by mouth every 6 (six) hours as needed for pain.  20 tablet  0  . potassium chloride SA (K-DUR,KLOR-CON) 20 MEQ tablet Take 1 tablet (20 mEq total) by mouth daily.  30 tablet  0  . pravastatin (PRAVACHOL) 40 MG tablet Take 40 mg by mouth every morning.       . traMADol (ULTRAM) 50 MG tablet Take 50 mg by mouth every 6 (six) hours as needed. As needed for pain.      Marland Kitchen zolpidem (AMBIEN) 5 MG tablet Take 5 mg by mouth at bedtime as needed. Sleep      . [DISCONTINUED] buPROPion (WELLBUTRIN XL) 150 MG 24 hr tablet Take 1 tablet (150 mg total) by mouth 2 (two) times daily.  60 tablet  5    ALLERGIES:  is allergic to lisinopril.  REVIEW OF SYSTEMS:  The rest of the 14-point review of system was negative.   Filed Vitals:   04/18/12  1140  BP: 113/73  Pulse: 81  Temp: 97.3 F (36.3 C)  Resp: 20   Wt Readings from Last 3 Encounters:  04/18/12 162 lb 8 oz (73.71 kg)  04/04/12 168 lb 6.4 oz (76.386 kg)  03/28/12 164 lb 12.8 oz (74.753 kg)   ECOG Performance status: 1  PHYSICAL EXAMINATION:   General:  Thin-appearing woman, in no acute distress.  Eyes:  no scleral icterus.  ENT:  There were no oropharyngeal lesions.  Neck was without thyromegaly.  Lymphatics:  Negative cervical, supraclavicular or axillary adenopathy.  Respiratory: lungs were clear bilaterally without wheezing or crackles.  Cardiovascular:  Regular rate and rhythm, S1/S2, without murmur, rub or gallop.  There was no pedal edema.  GI:  abdomen was  soft, flat, nontender, nondistended, without organomegaly.  Muscoloskeletal:  no spinal tenderness of palpation of vertebral spine.  Skin exam was without echymosis, petichae.  Neuro exam was nonfocal.Patient was able to get on and off exam table without assistance.  Gait was normal.  Patient was alerted and oriented.  Attention was good.   Language was appropriate.  Mood was normal without depression.  Speech was not pressured.  Thought content was not tangential.     LABORATORY/RADIOLOGY DATA:  Lab Results  Component Value Date   WBC 4.9 04/18/2012   HGB 12.0 04/18/2012   HCT 37.5 04/18/2012   PLT 300 04/18/2012   GLUCOSE 95 04/04/2012   CHOL 198 03/21/2012   TRIG 104 03/21/2012   HDL 53 03/21/2012   LDLDIRECT 111* 02/27/2010   LDLCALC 124* 03/21/2012   ALKPHOS 97 03/28/2012   ALT 23 03/28/2012   AST 16 03/28/2012   NA 142 04/04/2012   K 3.9 04/04/2012   CL 107 04/04/2012   CREATININE 0.7 04/04/2012   BUN 9.0 04/04/2012   CO2 26 04/04/2012   INR 1.10 10/19/2011     ASSESSMENT AND PLAN:   1. Recurrent Lung cancer now with brain med:  - s/p brain radiation.  - she is on maintenance Alimta.  Maintenance chemo in metastatic NSCLCA has been shown to improve PFS and OS.   She has tolerating this well.  I  recommended holding today dose due to colposcopy later this week to decrease risk of bleeding. We will resume chemo on 05/09/2012 as previously scheduled.  She is due for restaging brain MRI 05/27/12.  I went ahead and scheduled her for restaging CT chest and abdomen since last scan was 01/2012 to ensure that Alimta is working and she does not have disease progression on Alimta.  If she has disease progression, we will need to stop Alimta and start a salvage chemo.   2.  Abnormal PAP:  Pending colposcopy per Dr. Jennette Kettle.   3. History of colon cancer: She has no evidence of recurrent or metastatic disease for her colon cancer. Surveillance for her history of colon cancer entails once-a-year CT scan for 5 years' total.  She had a surveillance colonoscopy in October 2012 by Dr Christella Hartigan, which showed polyps. Per his recommendation, the next colonoscopy is due in Oct 2015 but sooner if she has symptoms.   4. Smoking: she will try to quit again.   5. Hypertension: She is on nebivolol, Catapres, HCTZ and per PCP.   6. Hypercholesterolemia: She is on pravastatin per PCP.   7. Anorexia: stable.  If her weight decreases significantly, I may consider Marinol.   8. Follow up: 05/09/12.   9. CODE STATUS: she decided to be FULL CODE with previous discussion.  We will address this again when her clinical status worsens.      The length of time of the face-to-face encounter was 15 minutes. More than 50% of time was spent counseling and coordination of care.

## 2012-04-18 NOTE — Telephone Encounter (Signed)
gv pt appt schedule for December 2013 thru February 2014. Pt aware central will call her re January 2014 ct. Pt has a mri 05/27/12 @ [facility] but wants ct done @ [facility]. Pt will inform central when she is contacted that she wants ct 1/8 or 05/26/12 and also wants to drink the water based prep.

## 2012-04-18 NOTE — Patient Instructions (Addendum)
1.  Diagnosis:  Lung cancer. 2.  Treatment:  Maintenance Alimta every 3 weeks.  3.  Follow up:  Restaging CT chest/abdomen same time same time as brain MRI (around mid Jan 2014).

## 2012-04-19 ENCOUNTER — Encounter: Payer: Self-pay | Admitting: *Deleted

## 2012-04-19 ENCOUNTER — Telehealth: Payer: Self-pay | Admitting: Family Medicine

## 2012-04-19 NOTE — Telephone Encounter (Signed)
Advised that she can take 600-800mg  ibuprofen.  Pt states that she cant take ibuprofen b/c it makes her really nauseous and sick.  Double check with Dr. [place] and she states that it is fine for pt to take tramadol one hour before appt. Lauren [name], Lauren [name]

## 2012-04-19 NOTE — Telephone Encounter (Signed)
Pt is having a colpo on Thurs and she read that she might need pain meds before the procedure.  She has tramadol and wants to know if that is enough and OK to take.

## 2012-04-20 ENCOUNTER — Encounter: Payer: Self-pay | Admitting: Cardiology

## 2012-04-20 ENCOUNTER — Ambulatory Visit (INDEPENDENT_AMBULATORY_CARE_PROVIDER_SITE_OTHER): Payer: Medicare Other | Admitting: Cardiology

## 2012-04-20 VITALS — BP 118/66 | HR 80 | Ht 64.0 in | Wt 163.0 lb

## 2012-04-20 DIAGNOSIS — I2510 Atherosclerotic heart disease of native coronary artery without angina pectoris: Secondary | ICD-10-CM

## 2012-04-20 NOTE — Progress Notes (Signed)
HPI Lauren [name] returns  today for evaluation and management coronary artery disease. She denies any angina or chest pain.  She is undergoing chemotherapy and also recently finished brain irradiation for metastatic cancer. She has started smoking again.  Past Medical History  Diagnosis Date  . Depression   . Hyperlipidemia   . Hypertension   . Colon cancer 11/08  . Lung cancer 06/16/10    PET scan 04/28/2010; primary: increase in size 02/2010  . Lung nodule     FNA ordered for 04/02/10 by HA>pos Ca  . Chronic folliculitis     of groin  . Fibrocystic breast changes   . Family history of trichomonal vaginitis 05/2005  . Postmenopausal   . Depressive disorder   . Hypercholesterolemia   . GERD (gastroesophageal reflux disease)   . Cerebral aneurysm   . Back pain   . Shortness of breath   . COPD (chronic obstructive pulmonary disease)   . Coronary artery disease   . Arthritis   . Weight loss 10/22/2011  . Status post radiation therapy 11/16/11 - 12/29/11    Right Lung and Mediastinum: 60 Gy  . Status post chemotherapy comp. 12/29/11    Carboplatin/Taxol  . Brain metastases 02/15/12    Current Outpatient Prescriptions  Medication Sig Dispense Refill  . albuterol (PROVENTIL HFA;VENTOLIN HFA) 108 (90 BASE) MCG/ACT inhaler Inhale 2 puffs into the lungs every 6 (six) hours as needed. For shortness of breath      . aspirin 81 MG EC tablet Take 81 mg by mouth every morning.       Marland Kitchen BYSTOLIC 10 MG tablet TAKE 1 TABLET BY MOUTH ONCE DAILY  30 tablet  5  . cholecalciferol (VITAMIN D) 1000 UNITS tablet Take 1,000 Units by mouth daily.      . cloNIDine (CATAPRES) 0.1 MG tablet Take 0.1 mg by mouth at bedtime.      . diclofenac sodium (VOLTAREN) 1 % GEL Apply 1 application topically 4 (four) times daily. As needed for pain      . folic acid (FOLVITE) 1 MG tablet Take 1 tablet (1 mg total) by mouth daily.  30 tablet  2  . hydrochlorothiazide (HYDRODIURIL) 25 MG tablet Take 25 mg by mouth every  morning.       . lidocaine-prilocaine (EMLA) cream Apply topically as needed. Apply to porta cath site one hour prior to needle stick.  30 g  2  . nitroGLYCERIN (NITROSTAT) 0.4 MG SL tablet Place 0.4 mg under the tongue every 5 (five) minutes as needed. For chest pain      . omeprazole (PRILOSEC) 20 MG capsule Take 20 mg by mouth daily.        Marland Kitchen oxyCODONE-acetaminophen (PERCOCET/ROXICET) 5-325 MG per tablet Take 1-2 tablets by mouth every 6 (six) hours as needed for pain.  20 tablet  0  . potassium chloride SA (K-DUR,KLOR-CON) 20 MEQ tablet Take 1 tablet (20 mEq total) by mouth daily.  30 tablet  0  . pravastatin (PRAVACHOL) 40 MG tablet Take 40 mg by mouth every morning.       . traMADol (ULTRAM) 50 MG tablet Take 50 mg by mouth every 6 (six) hours as needed. As needed for pain.      Marland Kitchen zolpidem (AMBIEN) 5 MG tablet Take 5 mg by mouth at bedtime as needed. Sleep      . [DISCONTINUED] buPROPion (WELLBUTRIN XL) 150 MG 24 hr tablet Take 1 tablet (150 mg total) by mouth 2 (two) times  daily.  60 tablet  5    Allergies  Allergen Reactions  . Lisinopril     REACTION: cough    Family History  Problem Relation Age of Onset  . Stomach cancer Maternal Aunt   . Breast cancer Cousin   . Cancer Sister     Lymphatic  . Osteoarthritis Father   . Gout Father   . Hypertension Father   . Heart disease Mother     pericarditis;   . Anesthesia problems Neg Hx     History   Social History  . Marital Status: Widowed    Spouse Name: N/A    Number of Children: N/A  . Years of Education: N/A   Occupational History  . Primary school teacher laborer    Social History Main Topics  . Smoking status: Current Every Day Smoker -- 1.5 packs/day    Types: Cigarettes  . Smokeless tobacco: Current User  . Alcohol Use: 0.0 oz/week     Comment: occasional  . Drug Use: No  . Sexually Active: Not on file   Other Topics Concern  . Not on file   Social History Narrative  . No narrative on file    ROS ALL  NEGATIVE EXCEPT THOSE NOTED IN HPI  PE  General Appearance: well developed, well nourished in no acute distress HEENT: symmetrical face, PERRLA, good dentition  Neck: no JVD, thyromegaly, or adenopathy, trachea midline Chest: symmetric without deformity Cardiac: PMI non-displaced, RRR, normal S1, S2, no gallop or murmur Lung: clear to ausculation and percussion Vascular: all pulses full without bruits  Abdominal: nondistended, nontender, good bowel sounds, no HSM, no bruits Extremities: no cyanosis, clubbing or edema, no sign of DVT, no varicosities  Skin: normal color, no rashes Neuro: alert and oriented x 3, non-focal Pysch: normal affect  EKG  BMET    Component Value Date/Time   NA 142 04/04/2012 0845   NA 139 02/15/2012 1159   NA 143 09/28/2011 0833   K 3.9 04/04/2012 0845   K 3.1* 02/15/2012 1159   K 3.5 09/28/2011 0833   CL 107 04/04/2012 0845   CL 102 02/15/2012 1159   CL 98 09/28/2011 0833   CO2 26 04/04/2012 0845   CO2 26 02/15/2012 1159   CO2 30 09/28/2011 0833   GLUCOSE 95 04/04/2012 0845   GLUCOSE 104* 02/15/2012 1159   GLUCOSE 112 09/28/2011 0833   BUN 9.0 04/04/2012 0845   BUN 10 02/15/2012 1159   BUN 9 09/28/2011 0833   CREATININE 0.7 04/04/2012 0845   CREATININE 0.67 02/15/2012 1159   CREATININE 0.7 09/28/2011 0833   CALCIUM 9.2 04/04/2012 0845   CALCIUM 9.3 02/15/2012 1159   CALCIUM 8.7 09/28/2011 0833   GFRNONAA >90 02/15/2012 1159   GFRAA >90 02/15/2012 1159    Lipid Panel     Component Value Date/Time   CHOL 198 03/21/2012 0915   TRIG 104 03/21/2012 0915   HDL 53 03/21/2012 0915   CHOLHDL 3.7 03/21/2012 0915   VLDL 21 03/21/2012 0915   LDLCALC 124* 03/21/2012 0915    CBC    Component Value Date/Time   WBC 4.9 04/18/2012 1126   WBC 6.2 04/11/2012 1000   RBC 4.58 04/18/2012 1126   RBC 4.59 04/11/2012 1000   HGB 12.0 04/18/2012 1126   HGB 12.2 04/11/2012 1000   HCT 37.5 04/18/2012 1126   HCT 37.3 04/11/2012 1000   PLT 300 04/18/2012 1126   PLT 228  04/11/2012 1000   MCV 81.9 04/18/2012 1126  MCV 81.3 04/11/2012 1000   MCH 26.2 04/18/2012 1126   MCH 26.6 04/11/2012 1000   MCHC 32.0 04/18/2012 1126   MCHC 32.7 04/11/2012 1000   RDW 16.1* 04/18/2012 1126   RDW 15.8* 04/11/2012 1000   LYMPHSABS 0.9 04/18/2012 1126   LYMPHSABS 0.8 04/11/2012 1000   MONOABS 0.6 04/18/2012 1126   MONOABS 0.7 04/11/2012 1000   EOSABS 0.1 04/18/2012 1126   EOSABS 0.0 04/11/2012 1000   BASOSABS 0.0 04/18/2012 1126   BASOSABS 0.0 04/11/2012 1000

## 2012-04-20 NOTE — Patient Instructions (Addendum)
Your physician wants you to follow-up in: 1 year with Dr. Wall. You will receive a reminder letter in the mail two months in advance. If you don't receive a letter, please call our office to schedule the follow-up appointment.  Your physician recommends that you continue on your current medications as directed. Please refer to the Current Medication list given to you today.  

## 2012-04-20 NOTE — Assessment & Plan Note (Signed)
Stable. Continue current medications. Not a candidate for invasive intervention in the future.

## 2012-04-21 ENCOUNTER — Ambulatory Visit (INDEPENDENT_AMBULATORY_CARE_PROVIDER_SITE_OTHER): Payer: Medicare Other | Admitting: Family Medicine

## 2012-04-21 ENCOUNTER — Encounter: Payer: Self-pay | Admitting: Family Medicine

## 2012-04-21 VITALS — BP 141/68 | HR 84 | Temp 98.1°F | Wt 163.5 lb

## 2012-04-21 DIAGNOSIS — R87811 Vaginal high risk human papillomavirus (HPV) DNA test positive: Secondary | ICD-10-CM

## 2012-04-21 DIAGNOSIS — IMO0002 Reserved for concepts with insufficient information to code with codable children: Secondary | ICD-10-CM | POA: Insufficient documentation

## 2012-04-21 HISTORY — DX: Reserved for concepts with insufficient information to code with codable children: IMO0002

## 2012-04-21 NOTE — Progress Notes (Signed)
Patient ID: Lauren [name], female   DOB: 09/08/1950, 61 y.o.   MRN: [id number] ASCUS + Hi Risk HPV Asymptomatic from a GU perspective  PERTINENT  PMH / PSH: Recurrent lung cancer Hx colon cancer Has brain mets  has been on chemo for 5 years and currently No personal or FH of breast Ca. Postmenopausal many years Patient given informed consent, signed copy in the chart.  Placed in lithotomy position. Cervix viewed with speculum and colposcope after application of acetic acid.   Colposcopy adequate (entire squamocolumnar junctions seen  in entirety) ?  no Acetowhite lesions?none Punctation?no Mosaicism?  no Abnormal vasculature?  no Biopsies?no ECC?no Complications? no  COMMENTS: Patient was given post procedure instructions.   A/P: given her versy significant comorbidities, I would recommend repeat pap in a year or two.Marland Kitchen

## 2012-04-27 ENCOUNTER — Telehealth: Payer: Self-pay | Admitting: *Deleted

## 2012-04-27 NOTE — Telephone Encounter (Signed)
Patient called regarding her appts for 12/23. Per the patient the lab/MD appt was in the morning and chemo in the afternoon. I looked at appts, they were moved to afternoon. I gave her the new appts. JWM

## 2012-05-04 ENCOUNTER — Other Ambulatory Visit: Payer: Self-pay | Admitting: Family Medicine

## 2012-05-04 NOTE — Telephone Encounter (Signed)
Patient is calling because she has changed her pharmacy to [facility].  She needs refills on Tramadol, Voltaren Gel.  She said that on Monday she will be at the [facility] so she would like to get these meds then.

## 2012-05-05 ENCOUNTER — Other Ambulatory Visit: Payer: Self-pay | Admitting: Oncology

## 2012-05-05 NOTE — Telephone Encounter (Signed)
Patient is calling because she forgot that she also needs a refill on her Albuterol inhaler.

## 2012-05-05 NOTE — Telephone Encounter (Signed)
Let's wait and recheck K+ next week.

## 2012-05-05 NOTE — Telephone Encounter (Signed)
Called pt to inform her ok to hold Potassium for now,  Will recheck next week.  She verbalized understanding.

## 2012-05-05 NOTE — Telephone Encounter (Signed)
Pt called to ask if she is supposed to refill her Potassium? Or should she hold it until next week when she has labs and sees Belenda Cruise again?

## 2012-05-06 MED ORDER — TRAMADOL HCL 50 MG PO TABS: 50.0000 mg | ORAL_TABLET | Freq: Four times a day (QID) | ORAL | Status: DC | PRN

## 2012-05-06 MED ORDER — DICLOFENAC SODIUM 1 % TD GEL: 2.0000 g | Freq: Four times a day (QID) | TRANSDERMAL | Status: DC

## 2012-05-06 MED ORDER — ALBUTEROL SULFATE HFA 108 (90 BASE) MCG/ACT IN AERS: 2.0000 | INHALATION_SPRAY | Freq: Four times a day (QID) | RESPIRATORY_TRACT | Status: DC | PRN

## 2012-05-06 NOTE — Telephone Encounter (Signed)
All Prescriptions sent to pharmacy-please notify her.

## 2012-05-09 ENCOUNTER — Ambulatory Visit: Payer: Self-pay | Admitting: Oncology

## 2012-05-09 ENCOUNTER — Other Ambulatory Visit: Payer: Self-pay | Admitting: Lab

## 2012-05-09 ENCOUNTER — Ambulatory Visit (HOSPITAL_BASED_OUTPATIENT_CLINIC_OR_DEPARTMENT_OTHER): Payer: Medicare Other | Admitting: Oncology

## 2012-05-09 ENCOUNTER — Ambulatory Visit (HOSPITAL_BASED_OUTPATIENT_CLINIC_OR_DEPARTMENT_OTHER): Payer: Medicare Other

## 2012-05-09 ENCOUNTER — Other Ambulatory Visit (HOSPITAL_BASED_OUTPATIENT_CLINIC_OR_DEPARTMENT_OTHER): Payer: Medicare Other | Admitting: Lab

## 2012-05-09 VITALS — BP 139/80 | HR 85 | Temp 98.0°F | Resp 20 | Ht 64.0 in | Wt 163.6 lb

## 2012-05-09 DIAGNOSIS — Z85038 Personal history of other malignant neoplasm of large intestine: Secondary | ICD-10-CM

## 2012-05-09 DIAGNOSIS — C7931 Secondary malignant neoplasm of brain: Secondary | ICD-10-CM

## 2012-05-09 DIAGNOSIS — C3490 Malignant neoplasm of unspecified part of unspecified bronchus or lung: Secondary | ICD-10-CM

## 2012-05-09 DIAGNOSIS — C3410 Malignant neoplasm of upper lobe, unspecified bronchus or lung: Secondary | ICD-10-CM

## 2012-05-09 DIAGNOSIS — F17200 Nicotine dependence, unspecified, uncomplicated: Secondary | ICD-10-CM

## 2012-05-09 DIAGNOSIS — Z5111 Encounter for antineoplastic chemotherapy: Secondary | ICD-10-CM

## 2012-05-09 DIAGNOSIS — C7949 Secondary malignant neoplasm of other parts of nervous system: Secondary | ICD-10-CM

## 2012-05-09 LAB — CBC WITH DIFFERENTIAL/PLATELET
BASO%: 0.6 % (ref 0.0–2.0)
LYMPH%: 20.3 % (ref 14.0–49.7)
MCHC: 32.5 g/dL (ref 31.5–36.0)
MCV: 81 fL (ref 79.5–101.0)
MONO#: 0.5 10*3/uL (ref 0.1–0.9)
MONO%: 9.8 % (ref 0.0–14.0)
NEUT#: 3.5 10*3/uL (ref 1.5–6.5)
Platelets: 192 10*3/uL (ref 145–400)
RBC: 4.94 10*6/uL (ref 3.70–5.45)
RDW: 16.2 % — ABNORMAL HIGH (ref 11.2–14.5)
WBC: 5.1 10*3/uL (ref 3.9–10.3)
nRBC: 0 % (ref 0–0)

## 2012-05-09 LAB — COMPREHENSIVE METABOLIC PANEL (CC13)
ALT: 10 U/L (ref 0–55)
AST: 12 U/L (ref 5–34)
Alkaline Phosphatase: 113 U/L (ref 40–150)
Creatinine: 0.7 mg/dL (ref 0.6–1.1)
Sodium: 142 mEq/L (ref 136–145)
Total Bilirubin: 0.79 mg/dL (ref 0.20–1.20)
Total Protein: 6.3 g/dL — ABNORMAL LOW (ref 6.4–8.3)

## 2012-05-09 MED ORDER — ONDANSETRON 8 MG/50ML IVPB (CHCC)
8.0000 mg | Freq: Once | INTRAVENOUS | Status: AC
  Administered 2012-05-09: 8 mg via INTRAVENOUS

## 2012-05-09 MED ORDER — SODIUM CHLORIDE 0.9 % IV SOLN
Freq: Once | INTRAVENOUS | Status: AC
  Administered 2012-05-09: 20 mL via INTRAVENOUS

## 2012-05-09 MED ORDER — SODIUM CHLORIDE 0.9 % IV SOLN
500.0000 mg/m2 | Freq: Once | INTRAVENOUS | Status: AC
  Administered 2012-05-09: 925 mg via INTRAVENOUS
  Filled 2012-05-09: qty 37

## 2012-05-09 MED ORDER — SODIUM CHLORIDE 0.9 % IJ SOLN
10.0000 mL | INTRAMUSCULAR | Status: DC | PRN
  Administered 2012-05-09: 10 mL
  Filled 2012-05-09: qty 10

## 2012-05-09 MED ORDER — CYANOCOBALAMIN 1000 MCG/ML IJ SOLN
1000.0000 ug | Freq: Once | INTRAMUSCULAR | Status: AC
  Administered 2012-05-09: 1000 ug via INTRAMUSCULAR

## 2012-05-09 MED ORDER — DEXAMETHASONE SODIUM PHOSPHATE 10 MG/ML IJ SOLN
10.0000 mg | Freq: Once | INTRAMUSCULAR | Status: AC
  Administered 2012-05-09: 10 mg via INTRAVENOUS

## 2012-05-09 MED ORDER — HEPARIN SOD (PORK) LOCK FLUSH 100 UNIT/ML IV SOLN
500.0000 [IU] | Freq: Once | INTRAVENOUS | Status: AC | PRN
  Administered 2012-05-09: 500 [IU]
  Filled 2012-05-09: qty 5

## 2012-05-10 ENCOUNTER — Encounter: Payer: Self-pay | Admitting: Oncology

## 2012-05-10 ENCOUNTER — Other Ambulatory Visit: Payer: Self-pay | Admitting: Oncology

## 2012-05-10 MED ORDER — POTASSIUM CHLORIDE CRYS ER 20 MEQ PO TBCR: 20.0000 meq | EXTENDED_RELEASE_TABLET | Freq: Every day | ORAL | Status: DC

## 2012-05-10 NOTE — Progress Notes (Signed)
[facility]  Telephone:([phone]) [phone] Fax:([phone]) [phone]   OFFICE PROGRESS NOTE   Cc:  CHAMBERLAIN,RACHEL, MD  PAST DIAGNOSIS AND PAST THERAPY:  1. 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 Her post resection surveillance colonoscopy onOctober 14, 2009, by Dr. Christella Hartigan was negative. 2. pT1a, pN0 well differentiated adenocarcinoma of lung; s/p left upper lobectomy on 06/16/2010. 3. 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  4. 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  She underwent stereotactic radiosurgery.  The left occipital 15mm target was treated using 4 Dynamic Conformal Arcs to a prescription dose of 20 Gy in 1 fraction. 6 MV photons, flattening filter free, were used. The right insula 4mm target was treated using 3 Circular Arcs to a prescription dose of 20 Gy in 1 fraction.  6 MV photons, flattening filter free, were used  CURRENT THERAPY: started maintenance chemo Alimta q3 weeks on 03/07/2012.    INTERVAL HISTORY: Lauren [name] 61 y.o. female returns for regular follow up with a friend.  She denied headache, nausea/vomiting, visual changes, fall, gait abnormality.  She has been tolerating chemo Alimita without skin rash, neuropathy, fever, mucositis, infection.  She does have dry skin.  Her appetite is fair and weight is stable. She now has been resuming smoking regular cigarettes alternating with electronic cigarettes. Per patient report, colposcopy was normal earlier this month. Her edema resolved with resumption of HCTZ.  She denied dizziness with HCTZ.    Patient denies fever, anorexia, weight loss, fatigue, headache, visual changes, confusion, drenching  night sweats, palpable lymph node swelling, mucositis, odynophagia, dysphagia, nausea vomiting, jaundice, chest pain, palpitation, shortness of breath, dyspnea on exertion, productive cough, gum bleeding, epistaxis, hematemesis, hemoptysis, abdominal pain, abdominal swelling, early satiety, melena, hematochezia, hematuria, vaginal bleeding, skin rash, spontaneous bleeding, joint swelling, joint pain, heat or cold intolerance, bowel bladder incontinence, back pain, focal motor weakness, paresthesia.     Past Medical History  Diagnosis Date  . Depression   . Hyperlipidemia   . Hypertension   . Colon cancer 11/08  . Lung cancer 06/16/10    PET scan 04/28/2010; primary: increase in size 02/2010  . Lung nodule     FNA ordered for 04/02/10 by HA>pos Ca  . Chronic folliculitis     of groin  . Fibrocystic breast changes   . Family history of trichomonal vaginitis 05/2005  . Postmenopausal   . Depressive disorder   . Hypercholesterolemia   . GERD (gastroesophageal reflux disease)   . Cerebral aneurysm   . Back pain   . Shortness of breath   . COPD (chronic obstructive pulmonary disease)   . Coronary artery disease   . Arthritis   . Weight loss 10/22/2011  . Status post radiation therapy 11/16/11 - 12/29/11    Right Lung and Mediastinum: 60 Gy  . Status post chemotherapy comp. 12/29/11    Carboplatin/Taxol  . Brain metastases 02/15/12    Past Surgical History  Procedure Date  . Colectomy 03/22/07  . Tubal ligation   . Lung lobectomy 06/16/10  . Hernia repair   . Breast surgery     Bil lumpectomy  . Back surgery   . Mediastinoscopy 10/19/2011    Procedure: MEDIASTINOSCOPY;  Surgeon: Alleen Borne, MD;  Location: [facility] OR;  Service: Thoracic;  Laterality: N/A;  . Cardiac cath x3     Current Outpatient Prescriptions  Medication Sig Dispense Refill  . albuterol (PROVENTIL HFA;VENTOLIN HFA) 108 (90 BASE) MCG/ACT inhaler Inhale 2 puffs into the lungs every 6 (six) hours as needed. For shortness  of breath  2 Inhaler  11  . aspirin 81 MG EC tablet Take 81 mg by mouth every morning.       Marland Kitchen BYSTOLIC 10 MG tablet TAKE 1 TABLET BY MOUTH ONCE DAILY  30 tablet  5  . cholecalciferol (VITAMIN D) 1000 UNITS tablet Take 1,000 Units by mouth daily.      . cloNIDine (CATAPRES) 0.1 MG tablet Take 0.1 mg by mouth at bedtime.      . diclofenac sodium (VOLTAREN) 1 % GEL Apply 2 g topically 4 (four) times daily. As needed for pain  100 g  11  . folic acid (FOLVITE) 1 MG tablet Take 1 tablet (1 mg total) by mouth daily.  30 tablet  2  . hydrochlorothiazide (HYDRODIURIL) 25 MG tablet Take 25 mg by mouth every morning.       Marland Kitchen KLOR-CON M20 20 MEQ tablet TAKE 1 TABLET BY MOUTH ONCE DAILY  30 tablet  0  . lidocaine-prilocaine (EMLA) cream Apply topically as needed. Apply to porta cath site one hour prior to needle stick.  30 g  2  . nitroGLYCERIN (NITROSTAT) 0.4 MG SL tablet Place 0.4 mg under the tongue every 5 (five) minutes as needed. For chest pain      . omeprazole (PRILOSEC) 20 MG capsule Take 20 mg by mouth daily.        Marland Kitchen oxyCODONE-acetaminophen (PERCOCET/ROXICET) 5-325 MG per tablet Take 1-2 tablets by mouth every 6 (six) hours as needed for pain.  20 tablet  0  . pravastatin (PRAVACHOL) 40 MG tablet Take 40 mg by mouth every morning.       . traMADol (ULTRAM) 50 MG tablet Take 1 tablet (50 mg total) by mouth every 6 (six) hours as needed. As needed for pain.  30 tablet  11  . zolpidem (AMBIEN) 5 MG tablet Take 5 mg by mouth at bedtime as needed. Sleep      . [DISCONTINUED] buPROPion (WELLBUTRIN XL) 150 MG 24 hr tablet Take 1 tablet (150 mg total) by mouth 2 (two) times daily.  60 tablet  5    ALLERGIES:  is allergic to lisinopril.  REVIEW OF SYSTEMS:  The rest of the 14-point review of system was negative.   Filed Vitals:   05/09/12 1359  BP: 139/80  Pulse: 85  Temp: 98 F (36.7 C)  Resp: 20   Wt Readings from Last 3 Encounters:  05/09/12 163 lb 9.6 oz (74.208 kg)  04/21/12 163 lb 8 oz  (74.163 kg)  04/20/12 163 lb (73.936 kg)   ECOG Performance status: 1  PHYSICAL EXAMINATION:   General:  Thin-appearing woman, in no acute distress.  Eyes:  no scleral icterus.  ENT:  There were no oropharyngeal lesions.  Neck was without thyromegaly.  Lymphatics:  Negative cervical, supraclavicular or axillary adenopathy.  Respiratory: lungs were clear bilaterally without wheezing or crackles.  Cardiovascular:  Regular rate and rhythm, S1/S2, without murmur, rub or gallop.  There was no pedal edema.  GI:  abdomen was soft, flat, nontender, nondistended, without organomegaly.  Muscoloskeletal:  no spinal tenderness of palpation of vertebral spine.  Skin exam was without echymosis, petichae.  Neuro exam was nonfocal.Patient  was able to get on and off exam table without assistance.  Gait was normal.  Patient was alerted and oriented.  Attention was good.   Language was appropriate.  Mood was normal without depression.  Speech was not pressured.  Thought content was not tangential.     LABORATORY/RADIOLOGY DATA:  Lab Results  Component Value Date   WBC 5.1 05/09/2012   HGB 13.0 05/09/2012   HCT 40.0 05/09/2012   PLT 192 05/09/2012   GLUCOSE 120* 05/09/2012   CHOL 198 03/21/2012   TRIG 104 03/21/2012   HDL 53 03/21/2012   LDLDIRECT 111* 02/27/2010   LDLCALC 124* 03/21/2012   ALKPHOS 113 05/09/2012   ALT 10 05/09/2012   AST 12 05/09/2012   NA 142 05/09/2012   K 2.9* 05/09/2012   CL 103 05/09/2012   CREATININE 0.7 05/09/2012   BUN 8.0 05/09/2012   CO2 27 05/09/2012   INR 1.10 10/19/2011     ASSESSMENT AND PLAN:   1. Recurrent Lung cancer now with brain med:  - s/p brain radiation.  - she is on maintenance Alimta.  Maintenance chemo in metastatic NSCLCA has been shown to improve PFS and OS.   She has tolerating this well.  I recommended proceeding with Alimta today without dose modification. She is due for restaging brain MRI 05/27/12. She will also have a CT scan in 05/2012. If she has  disease progression, we will need to stop Alimta and start a salvage chemo.   2.  Abnormal PAP:  Colposcopy per Dr. Jennette Kettle was normal.   3. History of colon cancer: She has no evidence of recurrent or metastatic disease for her colon cancer. Surveillance for her history of colon cancer entails once-a-year CT scan for 5 years' total.  She had a surveillance colonoscopy in October 2012 by Dr Christella Hartigan, which showed polyps. Per his recommendation, the next colonoscopy is due in Oct 2015 but sooner if she has symptoms.   4. Smoking: she will try to quit again.   5. Hypertension: She is on nebivolol, Catapres, HCTZ and per PCP.   6. Hypercholesterolemia: She is on pravastatin per PCP.   7. Anorexia: stable.  If her weight decreases significantly, I may consider Marinol.   8. Follow up: 3 weeks after scans.  9. CODE STATUS: she decided to be FULL CODE with previous discussion.  We will address this again when her clinical status worsens.      The length of time of the face-to-face encounter was 15 minutes. More than 50% of time was spent counseling and coordination of care.

## 2012-05-12 ENCOUNTER — Telehealth: Payer: Self-pay

## 2012-05-12 NOTE — Telephone Encounter (Signed)
Message copied by Kallie Locks on Thu May 12, 2012 11:03 AM ------      Message from: Myrtis Ser      Created: Tue May 10, 2012  2:01 PM      Regarding: Potassium       Please call pt. K+ is low and needs to restart. I have sent Rx for Kdur to her pharmacy.

## 2012-05-19 ENCOUNTER — Telehealth: Payer: Self-pay | Admitting: *Deleted

## 2012-05-19 ENCOUNTER — Telehealth: Payer: Self-pay | Admitting: Oncology

## 2012-05-19 ENCOUNTER — Other Ambulatory Visit: Payer: Self-pay | Admitting: Oncology

## 2012-05-19 DIAGNOSIS — M549 Dorsalgia, unspecified: Secondary | ICD-10-CM

## 2012-05-19 DIAGNOSIS — C3490 Malignant neoplasm of unspecified part of unspecified bronchus or lung: Secondary | ICD-10-CM

## 2012-05-19 MED ORDER — OXYCODONE-ACETAMINOPHEN 5-325 MG PO TABS: 1.0000 | ORAL_TABLET | Freq: Four times a day (QID) | ORAL | Status: DC | PRN

## 2012-05-19 MED ORDER — MORPHINE SULFATE ER 15 MG PO TBCR: 15.0000 mg | EXTENDED_RELEASE_TABLET | Freq: Two times a day (BID) | ORAL | Status: DC

## 2012-05-19 NOTE — Telephone Encounter (Signed)
Per 1.2.14 pof r/s lab b4 ct...Marland KitchenMarland KitchenDone...the patient already aware

## 2012-05-19 NOTE — Telephone Encounter (Signed)
Please let her know that I'm getting a bone scan to rule out bone met.  Bone scan will be most likely separate from CT scan (can't schedule it that quick).  I will prescribe MS Contin 15mg  PO BID and reserve Oxycodone for breakthrough.

## 2012-05-19 NOTE — Telephone Encounter (Signed)
Pt called w/ questions about her prep for upcoming CT scan.  Instructed her to call Radiology dept and gave her the number.  She also reports new pain in lower back which is cramping in nature,  Radiates down both legs to her knees and sometimes all the way to her feet. Pain occurs only in the evening and night and started about 3 nights ago.  Pt states pain 8/10,  Difficult to sleep and move or walk when pain occurs.  Tramadol not helping,  She had to take oxycodone she has at home but rarely takes, but it did help relieve the pain. Pt concerned about cause of pain,  Asks if Alimta could be causing this?

## 2012-05-19 NOTE — Telephone Encounter (Signed)
Informed pt of order for bone scan to be scheduled.  Also informed of Rx for ms contin and percocet which she can pick up.   We also discussed moving her lab appt from 1/13 to 1/08 morning of CT scan,  Since it will be needed for the CT.  Pt verbalized understanding.  Rx placed in book in injection room for pt pick up.

## 2012-05-23 ENCOUNTER — Telehealth: Payer: Self-pay | Admitting: *Deleted

## 2012-05-23 ENCOUNTER — Other Ambulatory Visit: Payer: Self-pay | Admitting: *Deleted

## 2012-05-23 NOTE — Telephone Encounter (Signed)
Called pt regarding CT scan was moved from 1/08 to 1/14, same day as bone scan.  Pt states Radiology r/s it that way and easier on her to not have to come on so many different days.  She is aware of MRI on 05/27/12.  Informed pt I will ask Dr. Gaylyn Rong about chemo and office visit on 1/13.  Does the office visit/chemo need to be r/s to after CT and bone scan?   Informed pt I will call her back on Wed w/ Dr. Lodema Pilot reply.  Will also need to r/s her lab accordingly. She verbalized understanding.

## 2012-05-23 NOTE — Telephone Encounter (Signed)
I've turned in POF to delay her visit.  Thanks.

## 2012-05-25 ENCOUNTER — Ambulatory Visit (HOSPITAL_COMMUNITY): Payer: Medicare Other

## 2012-05-25 ENCOUNTER — Other Ambulatory Visit: Payer: Self-pay | Admitting: Oncology

## 2012-05-25 ENCOUNTER — Other Ambulatory Visit: Payer: Self-pay | Admitting: Lab

## 2012-05-26 ENCOUNTER — Telehealth: Payer: Self-pay | Admitting: Oncology

## 2012-05-26 NOTE — Telephone Encounter (Signed)
s/w pt and advised on r/s appt for 1.16.14...Marland Kitchenpt aware adn ok

## 2012-05-27 ENCOUNTER — Telehealth: Payer: Self-pay | Admitting: Oncology

## 2012-05-27 ENCOUNTER — Ambulatory Visit
Admission: RE | Admit: 2012-05-27 | Discharge: 2012-05-27 | Disposition: A | Discharge status: Home / Self Care | Payer: Medicare Other | Source: Ambulatory Visit | Attending: Radiation Oncology | Admitting: Radiation Oncology

## 2012-05-27 DIAGNOSIS — C7931 Secondary malignant neoplasm of brain: Secondary | ICD-10-CM

## 2012-05-27 MED ORDER — GADOBENATE DIMEGLUMINE 529 MG/ML IV SOLN
15.0000 mL | Freq: Once | INTRAVENOUS | Status: AC | PRN
  Administered 2012-05-27: 15 mL via INTRAVENOUS

## 2012-05-27 NOTE — Telephone Encounter (Signed)
s.w. pt and advised on flush on 1.14.14 pt ok

## 2012-05-27 NOTE — Telephone Encounter (Signed)
Called pt to make sure all appts ok as scheduled and to inform lab not needed day of CT since she is under 62 yrs old.  Pt verbalized understanding and confirmed all appts.  She complained that MRI staff was not able to access her PAC today after 2 attempts.  She wants to make sure her Port can be used for scans next week.   Offered to schedule pt w/ Flush RN at [facility] to access her port prior to scan.  She agreed,  States wants to make sure it can be used as she does not have good veins on her arms.  Instructed pt to expect call from scheduler for Flush appt prior to scans for [facility] access.  She verbalized understanding.

## 2012-05-30 ENCOUNTER — Other Ambulatory Visit: Payer: Self-pay | Admitting: Oncology

## 2012-05-30 ENCOUNTER — Ambulatory Visit: Payer: Self-pay | Admitting: Oncology

## 2012-05-30 ENCOUNTER — Ambulatory Visit: Payer: Self-pay

## 2012-05-30 ENCOUNTER — Other Ambulatory Visit: Payer: Self-pay | Admitting: Lab

## 2012-05-30 ENCOUNTER — Other Ambulatory Visit: Payer: Self-pay | Admitting: Family Medicine

## 2012-05-31 ENCOUNTER — Ambulatory Visit (HOSPITAL_COMMUNITY)
Admission: RE | Admit: 2012-05-31 | Discharge: 2012-05-31 | Disposition: A | Discharge status: Home / Self Care | Payer: Medicare Other | Source: Ambulatory Visit | Attending: Oncology | Admitting: Oncology

## 2012-05-31 ENCOUNTER — Encounter (HOSPITAL_COMMUNITY): Payer: Medicare Other

## 2012-05-31 ENCOUNTER — Encounter (HOSPITAL_COMMUNITY): Payer: Self-pay

## 2012-05-31 ENCOUNTER — Ambulatory Visit (HOSPITAL_BASED_OUTPATIENT_CLINIC_OR_DEPARTMENT_OTHER): Payer: Medicare Other

## 2012-05-31 VITALS — BP 133/77 | HR 93 | Temp 97.1°F

## 2012-05-31 DIAGNOSIS — C3490 Malignant neoplasm of unspecified part of unspecified bronchus or lung: Secondary | ICD-10-CM

## 2012-05-31 DIAGNOSIS — M79609 Pain in unspecified limb: Secondary | ICD-10-CM | POA: Insufficient documentation

## 2012-05-31 DIAGNOSIS — M545 Low back pain, unspecified: Secondary | ICD-10-CM | POA: Insufficient documentation

## 2012-05-31 DIAGNOSIS — J438 Other emphysema: Secondary | ICD-10-CM | POA: Insufficient documentation

## 2012-05-31 DIAGNOSIS — E041 Nontoxic single thyroid nodule: Secondary | ICD-10-CM | POA: Insufficient documentation

## 2012-05-31 DIAGNOSIS — K7689 Other specified diseases of liver: Secondary | ICD-10-CM | POA: Insufficient documentation

## 2012-05-31 DIAGNOSIS — C7931 Secondary malignant neoplasm of brain: Secondary | ICD-10-CM | POA: Insufficient documentation

## 2012-05-31 DIAGNOSIS — C3410 Malignant neoplasm of upper lobe, unspecified bronchus or lung: Secondary | ICD-10-CM

## 2012-05-31 DIAGNOSIS — C189 Malignant neoplasm of colon, unspecified: Secondary | ICD-10-CM | POA: Insufficient documentation

## 2012-05-31 DIAGNOSIS — M549 Dorsalgia, unspecified: Secondary | ICD-10-CM

## 2012-05-31 DIAGNOSIS — Z452 Encounter for adjustment and management of vascular access device: Secondary | ICD-10-CM

## 2012-05-31 MED ORDER — TECHNETIUM TC 99M MEDRONATE IV KIT
25.0000 | PACK | Freq: Once | INTRAVENOUS | Status: AC | PRN
  Administered 2012-05-31: 25 via INTRAVENOUS

## 2012-05-31 MED ORDER — HEPARIN SOD (PORK) LOCK FLUSH 100 UNIT/ML IV SOLN
500.0000 [IU] | Freq: Once | INTRAVENOUS | Status: DC
  Filled 2012-05-31: qty 5

## 2012-05-31 MED ORDER — SODIUM CHLORIDE 0.9 % IJ SOLN
10.0000 mL | INTRAMUSCULAR | Status: DC | PRN
  Administered 2012-05-31: 10 mL via INTRAVENOUS
  Filled 2012-05-31: qty 10

## 2012-05-31 MED ORDER — IOHEXOL 300 MG/ML  SOLN
100.0000 mL | Freq: Once | INTRAMUSCULAR | Status: AC | PRN
  Administered 2012-05-31: 100 mL via INTRAVENOUS

## 2012-06-01 ENCOUNTER — Other Ambulatory Visit: Payer: Self-pay | Admitting: Radiation Therapy

## 2012-06-01 DIAGNOSIS — C7949 Secondary malignant neoplasm of other parts of nervous system: Secondary | ICD-10-CM

## 2012-06-02 ENCOUNTER — Telehealth: Payer: Self-pay | Admitting: Oncology

## 2012-06-02 ENCOUNTER — Ambulatory Visit (HOSPITAL_BASED_OUTPATIENT_CLINIC_OR_DEPARTMENT_OTHER): Payer: Medicare Other | Admitting: Oncology

## 2012-06-02 ENCOUNTER — Other Ambulatory Visit (HOSPITAL_BASED_OUTPATIENT_CLINIC_OR_DEPARTMENT_OTHER): Payer: Medicare Other | Admitting: Lab

## 2012-06-02 ENCOUNTER — Ambulatory Visit (HOSPITAL_BASED_OUTPATIENT_CLINIC_OR_DEPARTMENT_OTHER): Payer: Medicare Other

## 2012-06-02 VITALS — BP 119/73 | HR 82 | Temp 98.5°F | Resp 20 | Ht 64.0 in | Wt 163.8 lb

## 2012-06-02 DIAGNOSIS — C3410 Malignant neoplasm of upper lobe, unspecified bronchus or lung: Secondary | ICD-10-CM

## 2012-06-02 DIAGNOSIS — C3490 Malignant neoplasm of unspecified part of unspecified bronchus or lung: Secondary | ICD-10-CM

## 2012-06-02 DIAGNOSIS — C7931 Secondary malignant neoplasm of brain: Secondary | ICD-10-CM

## 2012-06-02 DIAGNOSIS — M549 Dorsalgia, unspecified: Secondary | ICD-10-CM

## 2012-06-02 DIAGNOSIS — C7949 Secondary malignant neoplasm of other parts of nervous system: Secondary | ICD-10-CM

## 2012-06-02 DIAGNOSIS — Z5111 Encounter for antineoplastic chemotherapy: Secondary | ICD-10-CM

## 2012-06-02 DIAGNOSIS — Z85038 Personal history of other malignant neoplasm of large intestine: Secondary | ICD-10-CM

## 2012-06-02 LAB — CBC WITH DIFFERENTIAL/PLATELET
BASO%: 0.2 % (ref 0.0–2.0)
LYMPH%: 16.2 % (ref 14.0–49.7)
MCHC: 32.1 g/dL (ref 31.5–36.0)
MCV: 80.8 fL (ref 79.5–101.0)
MONO#: 0.6 10*3/uL (ref 0.1–0.9)
MONO%: 10.4 % (ref 0.0–14.0)
Platelets: 257 10*3/uL (ref 145–400)
RBC: 4.74 10*6/uL (ref 3.70–5.45)
RDW: 15.9 % — ABNORMAL HIGH (ref 11.2–14.5)
WBC: 5.5 10*3/uL (ref 3.9–10.3)
nRBC: 0 % (ref 0–0)

## 2012-06-02 LAB — COMPREHENSIVE METABOLIC PANEL (CC13)
ALT: 8 U/L (ref 0–55)
AST: 11 U/L (ref 5–34)
Creatinine: 0.6 mg/dL (ref 0.6–1.1)
Sodium: 141 mEq/L (ref 136–145)
Total Bilirubin: 0.51 mg/dL (ref 0.20–1.20)

## 2012-06-02 MED ORDER — HEPARIN SOD (PORK) LOCK FLUSH 100 UNIT/ML IV SOLN
500.0000 [IU] | Freq: Once | INTRAVENOUS | Status: DC | PRN
  Filled 2012-06-02: qty 5

## 2012-06-02 MED ORDER — ONDANSETRON 8 MG/50ML IVPB (CHCC)
8.0000 mg | Freq: Once | INTRAVENOUS | Status: AC
  Administered 2012-06-02: 8 mg via INTRAVENOUS

## 2012-06-02 MED ORDER — DEXAMETHASONE SODIUM PHOSPHATE 10 MG/ML IJ SOLN
10.0000 mg | Freq: Once | INTRAMUSCULAR | Status: AC
  Administered 2012-06-02: 10 mg via INTRAVENOUS

## 2012-06-02 MED ORDER — SODIUM CHLORIDE 0.9 % IJ SOLN
10.0000 mL | INTRAMUSCULAR | Status: DC | PRN
  Filled 2012-06-02: qty 10

## 2012-06-02 MED ORDER — SODIUM CHLORIDE 0.9 % IV SOLN
500.0000 mg/m2 | Freq: Once | INTRAVENOUS | Status: AC
  Administered 2012-06-02: 925 mg via INTRAVENOUS
  Filled 2012-06-02: qty 37

## 2012-06-02 NOTE — Patient Instructions (Addendum)
[facility] Discharge Instructions for Patients Receiving Chemotherapy  Today you received the following chemotherapy agents alimta  To help prevent nausea and vomiting after your treatment, we encourage you to take your nausea medication  and take it as often as prescribed    If you develop nausea and vomiting that is not controlled by your nausea medication, call the clinic. If it is after clinic hours your family physician or the after hours number for the clinic or go to the Emergency Department.   BELOW ARE SYMPTOMS THAT SHOULD BE REPORTED IMMEDIATELY:  *FEVER GREATER THAN 100.5 F  *CHILLS WITH OR WITHOUT FEVER  NAUSEA AND VOMITING THAT IS NOT CONTROLLED WITH YOUR NAUSEA MEDICATION  *UNUSUAL SHORTNESS OF BREATH  *UNUSUAL BRUISING OR BLEEDING  TENDERNESS IN MOUTH AND THROAT WITH OR WITHOUT PRESENCE OF ULCERS  *URINARY PROBLEMS  *BOWEL PROBLEMS  UNUSUAL RASH Items with * indicate a potential emergency and should be followed up as soon as possible.  One of the nurses will contact you 24 hours after your treatment. Please let the nurse know about any problems that you may have experienced. Feel free to call the clinic you have any questions or concerns. The clinic phone number is [phone].   I have been informed and understand all the instructions given to me. I know to contact the clinic, my physician, or go to the Emergency Department if any problems should occur. I do not have any questions at this time, but understand that I may call the clinic during office hours   should I have any questions or need assistance in obtaining follow up care.    __________________________________________  _____________  __________ Signature of Patient or Authorized Representative            Date                   Time    __________________________________________ Nurse's Signature

## 2012-06-02 NOTE — Telephone Encounter (Signed)
appts made and printed for pt,pt aware that mri has to precert and they will call with appt

## 2012-06-02 NOTE — Progress Notes (Signed)
[facility]  [phone] [phone]   OFFICE PROGRESS NOTE   Cc:  CHAMBERLAIN,RACHEL, MD  PAST DIAGNOSIS AND PAST THERAPY:  1. 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 Her post resection surveillance colonoscopy onOctober 14, 2009, by Dr. Christella Hartigan was negative. 2. pT1a, pN0 well differentiated adenocarcinoma of lung; s/p left upper lobectomy on 06/16/2010. 3. 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  4. 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  She underwent stereotactic radiosurgery.  The left occipital 15mm target was treated using 4 Dynamic Conformal Arcs to a prescription dose of 20 Gy in 1 fraction. 6 MV photons, flattening filter free, were used. The right insula 4mm target was treated using 3 Circular Arcs to a prescription dose of 20 Gy in 1 fraction.  6 MV photons, flattening filter free, were used  CURRENT THERAPY: started maintenance chemo Alimta q3 weeks on 03/07/2012.    INTERVAL HISTORY: Lauren [name] 62 y.o. female returns for regular follow up with her daughter.  She recently had a restaging CT scan and bone scan.  She reported lower back pain for the past 2 weeks.  Pain was radiating to bilateral lower back.  Pain was described as crampy, severe when she was only taking Percocet.  With the addition of MS Contin, pain is now minimal.  She denied lower extremity weakness, paresthesia, bowel/bladder incontinence.  She has mild fatigue with chemo Alimta.  She is still independent of activities of daily living with grocery shopping, cooking.  She had some skin rash on bilateral lower extremities which has improved.    Patient  denies fever, anorexia, weight loss, headache, visual changes, confusion, drenching night sweats, palpable lymph node swelling, mucositis, odynophagia, dysphagia, nausea vomiting, jaundice, chest pain, palpitation, shortness of breath, dyspnea on exertion, productive cough, gum bleeding, epistaxis, hematemesis, hemoptysis, abdominal pain, abdominal swelling, early satiety, melena, hematochezia, hematuria, skin rash, spontaneous bleeding, joint swelling, joint pain, heat or cold intolerance, focal motor weakness, depression.      Past Medical History  Diagnosis Date  . Depression   . Hyperlipidemia   . Hypertension   . Lung nodule     FNA ordered for 04/02/10 by HA>pos Ca  . Chronic folliculitis     of groin  . Fibrocystic breast changes   . Family history of trichomonal vaginitis 05/2005  . Postmenopausal   . Depressive disorder   . Hypercholesterolemia   . GERD (gastroesophageal reflux disease)   . Cerebral aneurysm   . Back pain   . Shortness of breath   . COPD (chronic obstructive pulmonary disease)   . Coronary artery disease   . Arthritis   . Weight loss 10/22/2011  . Status post radiation therapy 11/16/11 - 12/29/11    Right Lung and Mediastinum: 60 Gy  . Status post chemotherapy comp. 12/29/11    Carboplatin/Taxol  . Colon cancer 11/08  . Lung cancer 06/16/10    PET scan 04/28/2010; primary: increase in size 02/2010  . Brain metastases 02/15/12    Past Surgical History  Procedure Date  . Colectomy 03/22/07  . Tubal ligation   . Lung lobectomy 06/16/10  . Hernia repair   . Breast surgery     Bil lumpectomy  .  Back surgery   . Mediastinoscopy 10/19/2011    Procedure: MEDIASTINOSCOPY;  Surgeon: Alleen Borne, MD;  Location: [facility] OR;  Service: Thoracic;  Laterality: N/A;  . Cardiac cath x3     Current Outpatient Prescriptions  Medication Sig Dispense Refill  . albuterol (PROVENTIL HFA;VENTOLIN HFA) 108 (90 BASE) MCG/ACT inhaler Inhale 2 puffs into the lungs every 6 (six)  hours as needed. For shortness of breath  2 Inhaler  11  . aspirin 81 MG EC tablet Take 81 mg by mouth every morning.       Marland Kitchen BYSTOLIC 10 MG tablet TAKE 1 TABLET BY MOUTH ONCE DAILY  30 tablet  5  . cholecalciferol (VITAMIN D) 1000 UNITS tablet Take 1,000 Units by mouth daily.      . cloNIDine (CATAPRES) 0.1 MG tablet Take 0.1 mg by mouth at bedtime.      . diclofenac sodium (VOLTAREN) 1 % GEL Apply 2 g topically 4 (four) times daily. As needed for pain  100 g  11  . folic acid (FOLVITE) 1 MG tablet TAKE 1 TABLET BY MOUTH ONCE DAILY  30 tablet  1  . hydrochlorothiazide (HYDRODIURIL) 25 MG tablet TAKE 1 TABLET BY MOUTH ONCE DAILY  30 tablet  11  . lidocaine-prilocaine (EMLA) cream Apply topically as needed. Apply to porta cath site one hour prior to needle stick.  30 g  2  . morphine (MS CONTIN) 15 MG 12 hr tablet Take 1 tablet (15 mg total) by mouth 2 (two) times daily.  60 tablet  0  . nitroGLYCERIN (NITROSTAT) 0.4 MG SL tablet Place 0.4 mg under the tongue every 5 (five) minutes as needed. For chest pain      . omeprazole (PRILOSEC) 20 MG capsule Take 20 mg by mouth daily.        Marland Kitchen oxyCODONE-acetaminophen (PERCOCET/ROXICET) 5-325 MG per tablet Take 1-2 tablets by mouth every 6 (six) hours as needed for pain.  90 tablet  0  . potassium chloride SA (KLOR-CON M20) 20 MEQ tablet Take 1 tablet (20 mEq total) by mouth daily.  30 tablet  2  . pravastatin (PRAVACHOL) 40 MG tablet Take 40 mg by mouth every morning.       . traMADol (ULTRAM) 50 MG tablet Take 1 tablet (50 mg total) by mouth every 6 (six) hours as needed. As needed for pain.  30 tablet  11  . zolpidem (AMBIEN) 5 MG tablet Take 5 mg by mouth at bedtime as needed. Sleep      . [DISCONTINUED] buPROPion (WELLBUTRIN XL) 150 MG 24 hr tablet Take 1 tablet (150 mg total) by mouth 2 (two) times daily.  60 tablet  5    ALLERGIES:  is allergic to lisinopril.  REVIEW OF SYSTEMS:  The rest of the 14-point review of system was negative.   Filed  Vitals:   06/02/12 1500  BP: 119/73  Pulse: 82  Temp: 98.5 F (36.9 C)  Resp: 20   Wt Readings from Last 3 Encounters:  06/02/12 163 lb 12.8 oz (74.299 kg)  05/09/12 163 lb 9.6 oz (74.208 kg)  04/21/12 163 lb 8 oz (74.163 kg)   ECOG Performance status: 1  PHYSICAL EXAMINATION:   General:  Thin-appearing woman, in no acute distress.  Eyes:  no scleral icterus.  ENT:  There were no oropharyngeal lesions.  Neck was without thyromegaly.  Lymphatics:  Negative cervical, supraclavicular or axillary adenopathy.  Respiratory: lungs were clear bilaterally without wheezing or crackles.  Cardiovascular:  Regular rate and rhythm, S1/S2, without murmur, rub or gallop.  There was no pedal edema.  GI:  abdomen was soft, flat, nontender, nondistended, without organomegaly.  Muscoloskeletal:  no spinal tenderness of palpation of vertebral spine.  Skin exam was without echymosis, petichae.  Neuro exam was nonfocal.Patient was able to get on and off exam table without assistance.  Gait was normal.  Patient was alerted and oriented.  Attention was good.   Language was appropriate.  Mood was normal without depression.  Speech was not pressured.  Thought content was not tangential.     LABORATORY/RADIOLOGY DATA:  Lab Results  Component Value Date   WBC 5.5 06/02/2012   HGB 12.3 06/02/2012   HCT 38.3 06/02/2012   PLT 257 06/02/2012   GLUCOSE 100* 06/02/2012   CHOL 198 03/21/2012   TRIG 104 03/21/2012   HDL 53 03/21/2012   LDLDIRECT 111* 02/27/2010   LDLCALC 124* 03/21/2012   ALKPHOS 105 06/02/2012   ALT 8 06/02/2012   AST 11 06/02/2012   NA 141 06/02/2012   K 3.9 06/02/2012   CL 104 06/02/2012   CREATININE 0.6 06/02/2012   BUN 10.0 06/02/2012   CO2 27 06/02/2012   INR 1.10 10/19/2011   IMAGING: I personally reviewed the following CT scan and bone scan and showed the images to the patient and her daughter.   CT CHEST  Findings: Stable cystic nodule along the left thyroid lobe noted.  Stranding noted in the  mediastinum, likely therapy related, with  trace fluid in the superior pericardial recesses. Lower  paratracheal lymph node on the right measures 0.8 cm, down from 1.1  cm previously. Currently no overtly pathologically enlarged  adenopathy is observed in the chest.  Atherosclerosis noted including the left main and left anterior  descending coronary arteries.  No pleural effusion observed. Emphysema is present.  Mild increase in very ill-defined ground-glass opacities  approximately the size of secondary pulmonary lobules posteriorly  in the right upper lobe. In addition to the two ground glass  opacities shown previously, which persist on image 16 of series 4,  there is a new adjacent band-like ground-glass opacity measuring  approximately 1.5 x 0.9 cm. Additional new faint ground-glass  opacities in this vicinity are present inferiorly.  There is a suggestion of mild tree in bud interstitial accentuation  in the superior segment right lower lobe.  There is volume loss and mildly increased associated scarring or  atelectasis anteriorly in the left lower lobe.  IMPRESSION:  1. Persistent faint right upper lobe ground-glass opacities, along  with some new patchy ground-glass opacities in the right upper lobe  with differential diagnostic considerations including low grade  adenocarcinoma, chronic eosinophilic pneumonia or chronic atypical  infectious process. There is a suggestion of mild atypical  bronchiolitis in the superior segment right lower lobe.  2. Progressive volume loss in the left lower lobe anteriorly along  the region of scarring.  3. No overt residual adenopathy. Stranding in the mediastinum is  attributed to treated malignancy.  CT ABDOMEN  Findings: Hepatic steatosis noted. Several tiny hepatic cysts  appear stable. Mild focally prominent fatty infiltration noted  along the falciform ligament. The spleen, adrenal glands, and  pancreas appear unremarkable.  The  gallbladder and biliary system appear unremarkable.  No pathologic retroperitoneal or porta hepatis adenopathy is  identified. There is a tiny left kidney upper pole cyst.  Prominence of stool throughout the colon suggests constipation.  Abdominal aortic atherosclerosis noted.  Concern was  raised on the recent bone scan regarding possible bony  malignancy at L4 eccentric to the left, but I do not observe a  definite CT abnormality in this vicinity to correlate.  IMPRESSION:  1. No compelling findings of metastatic disease to the abdomen.  High activity in the L4 vertebra on today's bone scan does not have  an obvious CT correlate, but the bony metastatic disease cannot be  present in the absence of obvious CT findings.  2. Prominence of stool throughout the colon suggests constipation.  3. Hepatic steatosis.  BONE SCAN:   Findings:  Single focus of intense tracer accumulation is identified at the  anterior right chest.  The reservoir of the patient's right jugular Port-A-Cath appears  lateral to this position on a study of 04/11/2012.  Uptake identified at the right lateral aspect of the mid lumbar  spine of L4; no definite degenerative changes are identified on the  prior PET CT at this site.  No additional abnormal foci of osseous tracer accumulation are  identified.  Expected urinary tract and soft tissue distribution of tracer  otherwise seen.  IMPRESSION:  Uptake at right lateral aspect of the lumbar spine at L4,  metastatic lesion not excluded; recommend MR imaging of the lumbar  spine with and without contrast for further evaluation.  Single focus of abnormal tracer identified at the anterior right  chest, intense, question surface contamination, related to Port-A-  Cath access, or less likely an osseous metastasis at the anterior  right fourth rib.    ASSESSMENT AND PLAN:   1. Recurrent Lung cancer now with brain med:  - s/p brain radiation.  - she has on  maintenance Alimta since 02/2012.  There is no convincing evidence of widespread disease progression.  I recommended her to continue maintenance Alimta as she has been tolerating well.  She expressed informed understanding and agreed with recommendation.   2.  Back pain with questionable uptake on bone scan:  I recommended lumbar MRI to rule out bone met.  She pain has been well controlled with pain meds and no concerning symptoms of cord compression.  If she has bone met, I will refer her back to Rad Onc for palliative radiation and hold off on chemo while on radiation.   3.  Abnormal PAP:  Pending colposcopy per Dr. Jennette Kettle.  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  She is on observation for this.   3. History of colon cancer: She has no evidence of recurrent or metastatic disease for her colon cancer. Surveillance for her history of colon cancer entails once-a-year CT scan for 5 years' total.  She had a surveillance colonoscopy in October 2012 by Dr Christella Hartigan, which showed polyps. Per his recommendation, the next colonoscopy is due in Oct 2015 but sooner if she has symptoms.   4. Hypertension: She is on Bystolic, Catapres, HCTZ and per PCP.   5. Hypercholesterolemia: She is on pravastatin per PCP.   6. Anorexia: stable weight for now.   8. Follow up: in about 3 weeks.   9. CODE STATUS: she decided to be FULL CODE with previous discussion.  We will address this again when her clinical status worsens.      The length of time of the face-to-face encounter was 25 minutes. More than 50% of time was spent counseling and coordination of care.

## 2012-06-13 ENCOUNTER — Ambulatory Visit (HOSPITAL_COMMUNITY)
Admission: RE | Admit: 2012-06-13 | Discharge: 2012-06-13 | Disposition: A | Discharge status: Home / Self Care | Payer: Medicare Other | Source: Ambulatory Visit | Attending: Oncology | Admitting: Oncology

## 2012-06-13 ENCOUNTER — Telehealth: Payer: Self-pay | Admitting: *Deleted

## 2012-06-13 ENCOUNTER — Other Ambulatory Visit: Payer: Self-pay | Admitting: Oncology

## 2012-06-13 DIAGNOSIS — C3490 Malignant neoplasm of unspecified part of unspecified bronchus or lung: Secondary | ICD-10-CM

## 2012-06-13 DIAGNOSIS — X58XXXA Exposure to other specified factors, initial encounter: Secondary | ICD-10-CM | POA: Insufficient documentation

## 2012-06-13 DIAGNOSIS — D1809 Hemangioma of other sites: Secondary | ICD-10-CM | POA: Insufficient documentation

## 2012-06-13 DIAGNOSIS — M7130 Other bursal cyst, unspecified site: Secondary | ICD-10-CM | POA: Insufficient documentation

## 2012-06-13 DIAGNOSIS — R948 Abnormal results of function studies of other organs and systems: Secondary | ICD-10-CM | POA: Insufficient documentation

## 2012-06-13 DIAGNOSIS — M549 Dorsalgia, unspecified: Secondary | ICD-10-CM

## 2012-06-13 DIAGNOSIS — M8430XA Stress fracture, unspecified site, initial encounter for fracture: Secondary | ICD-10-CM | POA: Insufficient documentation

## 2012-06-13 DIAGNOSIS — M5126 Other intervertebral disc displacement, lumbar region: Secondary | ICD-10-CM | POA: Insufficient documentation

## 2012-06-13 MED ORDER — GADOBENATE DIMEGLUMINE 529 MG/ML IV SOLN
15.0000 mL | Freq: Once | INTRAVENOUS | Status: AC | PRN
  Administered 2012-06-13: 15 mL via INTRAVENOUS

## 2012-06-13 NOTE — Telephone Encounter (Signed)
Message copied by Wende Mott on Mon Jun 13, 2012  5:10 PM ------      Message from: HA, Raliegh Ip T      Created: Mon Jun 13, 2012 12:30 PM       Please call pt.  She does not have bone met on the bone scan.  Thanks.

## 2012-06-13 NOTE — Telephone Encounter (Signed)
Called pt and informed no bone mets on scan per Dr. Gaylyn Rong.  She verbalized understanding.

## 2012-06-14 ENCOUNTER — Telehealth: Payer: Self-pay | Admitting: Oncology

## 2012-06-14 ENCOUNTER — Telehealth: Payer: Self-pay | Admitting: *Deleted

## 2012-06-14 NOTE — Telephone Encounter (Signed)
advised pt of time change of 2.6.14 appt....pt ok

## 2012-06-14 NOTE — Telephone Encounter (Signed)
I have scheduled appts per POF.  JMW

## 2012-06-16 ENCOUNTER — Telehealth: Payer: Self-pay | Admitting: *Deleted

## 2012-06-16 NOTE — Telephone Encounter (Signed)
Pt called for question about MyChart.  States she logged in too many times w/ wrong password and now it has kicked her out saying she needs a new activation code.  Gave pt ph # for MyChart tech support [phone].

## 2012-06-20 ENCOUNTER — Ambulatory Visit: Payer: Self-pay

## 2012-06-23 ENCOUNTER — Ambulatory Visit (HOSPITAL_BASED_OUTPATIENT_CLINIC_OR_DEPARTMENT_OTHER): Payer: Medicare Other

## 2012-06-23 ENCOUNTER — Telehealth: Payer: Self-pay | Admitting: *Deleted

## 2012-06-23 ENCOUNTER — Other Ambulatory Visit (HOSPITAL_BASED_OUTPATIENT_CLINIC_OR_DEPARTMENT_OTHER): Payer: Medicare Other | Admitting: Lab

## 2012-06-23 ENCOUNTER — Ambulatory Visit: Payer: Self-pay

## 2012-06-23 ENCOUNTER — Ambulatory Visit: Payer: Self-pay | Admitting: Oncology

## 2012-06-23 ENCOUNTER — Other Ambulatory Visit: Payer: Self-pay | Admitting: Lab

## 2012-06-23 ENCOUNTER — Telehealth: Payer: Self-pay | Admitting: Oncology

## 2012-06-23 ENCOUNTER — Ambulatory Visit (HOSPITAL_BASED_OUTPATIENT_CLINIC_OR_DEPARTMENT_OTHER): Payer: Medicare Other | Admitting: Oncology

## 2012-06-23 VITALS — BP 109/69 | HR 91 | Temp 97.8°F | Resp 20 | Ht 64.0 in | Wt 160.0 lb

## 2012-06-23 DIAGNOSIS — Z5111 Encounter for antineoplastic chemotherapy: Secondary | ICD-10-CM

## 2012-06-23 DIAGNOSIS — C3410 Malignant neoplasm of upper lobe, unspecified bronchus or lung: Secondary | ICD-10-CM

## 2012-06-23 DIAGNOSIS — M549 Dorsalgia, unspecified: Secondary | ICD-10-CM

## 2012-06-23 DIAGNOSIS — C3490 Malignant neoplasm of unspecified part of unspecified bronchus or lung: Secondary | ICD-10-CM

## 2012-06-23 DIAGNOSIS — E876 Hypokalemia: Secondary | ICD-10-CM

## 2012-06-23 DIAGNOSIS — C7949 Secondary malignant neoplasm of other parts of nervous system: Secondary | ICD-10-CM

## 2012-06-23 DIAGNOSIS — C7931 Secondary malignant neoplasm of brain: Secondary | ICD-10-CM

## 2012-06-23 DIAGNOSIS — Z853 Personal history of malignant neoplasm of breast: Secondary | ICD-10-CM

## 2012-06-23 LAB — COMPREHENSIVE METABOLIC PANEL (CC13)
Albumin: 2.8 g/dL — ABNORMAL LOW (ref 3.5–5.0)
Alkaline Phosphatase: 106 U/L (ref 40–150)
CO2: 27 mEq/L (ref 22–29)
Calcium: 9.2 mg/dL (ref 8.4–10.4)
Total Protein: 6.5 g/dL (ref 6.4–8.3)

## 2012-06-23 LAB — CBC WITH DIFFERENTIAL/PLATELET
BASO%: 0.4 % (ref 0.0–2.0)
EOS%: 1.4 % (ref 0.0–7.0)
HCT: 36.2 % (ref 34.8–46.6)
LYMPH%: 17.4 % (ref 14.0–49.7)
MCH: 25.4 pg (ref 25.1–34.0)
MCHC: 31.5 g/dL (ref 31.5–36.0)
MCV: 80.8 fL (ref 79.5–101.0)
MONO%: 10.5 % (ref 0.0–14.0)
NEUT%: 70.3 % (ref 38.4–76.8)
Platelets: 308 10*3/uL (ref 145–400)

## 2012-06-23 MED ORDER — SODIUM CHLORIDE 0.9 % IV SOLN
Freq: Once | INTRAVENOUS | Status: AC
  Administered 2012-06-23: 14:00:00 via INTRAVENOUS

## 2012-06-23 MED ORDER — HEPARIN SOD (PORK) LOCK FLUSH 100 UNIT/ML IV SOLN
500.0000 [IU] | Freq: Once | INTRAVENOUS | Status: DC | PRN
  Filled 2012-06-23: qty 5

## 2012-06-23 MED ORDER — DEXAMETHASONE SODIUM PHOSPHATE 10 MG/ML IJ SOLN
10.0000 mg | Freq: Once | INTRAMUSCULAR | Status: AC
  Administered 2012-06-23: 10 mg via INTRAVENOUS

## 2012-06-23 MED ORDER — ONDANSETRON 8 MG/50ML IVPB (CHCC)
8.0000 mg | Freq: Once | INTRAVENOUS | Status: AC
  Administered 2012-06-23: 8 mg via INTRAVENOUS

## 2012-06-23 MED ORDER — POTASSIUM CHLORIDE CRYS ER 20 MEQ PO TBCR: 20.0000 meq | EXTENDED_RELEASE_TABLET | Freq: Every day | ORAL | Status: DC

## 2012-06-23 MED ORDER — SODIUM CHLORIDE 0.9 % IJ SOLN
10.0000 mL | INTRAMUSCULAR | Status: DC | PRN
  Filled 2012-06-23: qty 10

## 2012-06-23 MED ORDER — SODIUM CHLORIDE 0.9 % IV SOLN
500.0000 mg/m2 | Freq: Once | INTRAVENOUS | Status: AC
  Administered 2012-06-23: 925 mg via INTRAVENOUS
  Filled 2012-06-23: qty 37

## 2012-06-23 NOTE — Telephone Encounter (Signed)
Message copied by Wende Mott on Thu Jun 23, 2012  5:02 PM ------      Message from: HA, Raliegh Ip T      Created: Thu Jun 23, 2012  2:39 PM       Please inform patient that she still needs KCl.  I'll refill electronically.  Thanks.

## 2012-06-23 NOTE — Telephone Encounter (Signed)
Per staff message and POF I have scheduled appts.  JMW  

## 2012-06-23 NOTE — Telephone Encounter (Signed)
gv and priinted appt schedule for pt for Feb and March

## 2012-06-23 NOTE — Patient Instructions (Addendum)
1.  Diagnosis:  Metastatic nonsmall cell lung cancer. 2.  Recommendation:  Continue maintenance chemo Alimta every 3 weeks.  Next restaging scan in May 2014 unless symptoms.  3.  Back pain:  Due to degenerative joint disease.  No sign of cancer to the back bone on MRI.

## 2012-06-23 NOTE — Telephone Encounter (Signed)
Called pt and informed of need to continue Potassium and refill sent to pharmacy. She verbalized understanding.

## 2012-06-23 NOTE — Progress Notes (Signed)
[facility]  [phone] [phone]   OFFICE PROGRESS NOTE   Cc:  CHAMBERLAIN,RACHEL, MD  PAST DIAGNOSIS AND PAST THERAPY:  1. 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 Her post resection surveillance colonoscopy onOctober 14, 2009, by Dr. Christella Hartigan was negative. 2. pT1a, pN0 well differentiated adenocarcinoma of lung; s/p left upper lobectomy on 06/16/2010. 3. 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  4. 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  She underwent stereotactic radiosurgery.  The left occipital 15mm target was treated using 4 Dynamic Conformal Arcs to a prescription dose of 20 Gy in 1 fraction. 6 MV photons, flattening filter free, were used. The right insula 4mm target was treated using 3 Circular Arcs to a prescription dose of 20 Gy in 1 fraction.  6 MV photons, flattening filter free, were used  CURRENT THERAPY: started maintenance chemo Alimta q3 weeks on 03/07/2012.    INTERVAL HISTORY: Lauren [name] 62 y.o. female returns for regular follow up by herself.  She still has mid-lower back pain.  Pain is crampy, mild; no radiating, resolved with pain med; no leg weakness/paresthesia/ bowel or bladder incontinence.  She is tolerating chemo Alimta well without problem.  Patient denies fever, anorexia, weight loss, fatigue, headache, visual changes, confusion, drenching night sweats, palpable lymph node swelling, mucositis, odynophagia, dysphagia, nausea vomiting, jaundice, chest pain, palpitation, shortness of breath, dyspnea on exertion, productive cough, gum bleeding, epistaxis, hematemesis, hemoptysis, abdominal pain,  abdominal swelling, early satiety, melena, hematochezia, hematuria, skin rash, spontaneous bleeding, joint swelling, joint pain, heat or cold intolerance, bowel bladder incontinence, back pain, focal motor weakness, paresthesia, depression.       Past Medical History  Diagnosis Date  . Depression   . Hyperlipidemia   . Hypertension   . Lung nodule     FNA ordered for 04/02/10 by Shoshana Johal>pos Ca  . Chronic folliculitis     of groin  . Fibrocystic breast changes   . Family history of trichomonal vaginitis 05/2005  . Postmenopausal   . Depressive disorder   . Hypercholesterolemia   . GERD (gastroesophageal reflux disease)   . Cerebral aneurysm   . Back pain   . Shortness of breath   . COPD (chronic obstructive pulmonary disease)   . Coronary artery disease   . Arthritis   . Weight loss 10/22/2011  . Status post radiation therapy 11/16/11 - 12/29/11    Right Lung and Mediastinum: 60 Gy  . Status post chemotherapy comp. 12/29/11    Carboplatin/Taxol  . Colon cancer 11/08  . Lung cancer 06/16/10    PET scan 04/28/2010; primary: increase in size 02/2010  . Brain metastases 02/15/12    Past Surgical History  Procedure Date  . Colectomy 03/22/07  . Tubal ligation   . Lung lobectomy 06/16/10  . Hernia repair   . Breast surgery     Bil lumpectomy  . Back surgery   . Mediastinoscopy 10/19/2011    Procedure: MEDIASTINOSCOPY;  Surgeon: Alleen Borne, MD;  Location: [facility] OR;  Service: Thoracic;  Laterality: N/A;  . Cardiac cath x3     Current Outpatient Prescriptions  Medication Sig Dispense Refill  . albuterol (PROVENTIL HFA;VENTOLIN HFA) 108 (90 BASE) MCG/ACT  inhaler Inhale 2 puffs into the lungs every 6 (six) hours as needed. For shortness of breath  2 Inhaler  11  . aspirin 81 MG EC tablet Take 81 mg by mouth every morning.       Marland Kitchen BYSTOLIC 10 MG tablet TAKE 1 TABLET BY MOUTH ONCE DAILY  30 tablet  5  . cholecalciferol (VITAMIN D) 1000 UNITS tablet Take 1,000 Units by mouth daily.      .  cloNIDine (CATAPRES) 0.1 MG tablet Take 0.1 mg by mouth at bedtime.      . diclofenac sodium (VOLTAREN) 1 % GEL Apply 2 g topically 4 (four) times daily. As needed for pain  100 g  11  . folic acid (FOLVITE) 1 MG tablet TAKE 1 TABLET BY MOUTH ONCE DAILY  30 tablet  1  . hydrochlorothiazide (HYDRODIURIL) 25 MG tablet TAKE 1 TABLET BY MOUTH ONCE DAILY  30 tablet  11  . lidocaine-prilocaine (EMLA) cream Apply topically as needed. Apply to porta cath site one hour prior to needle stick.  30 g  2  . morphine (MS CONTIN) 15 MG 12 hr tablet Take 1 tablet (15 mg total) by mouth 2 (two) times daily.  60 tablet  0  . nitroGLYCERIN (NITROSTAT) 0.4 MG SL tablet Place 0.4 mg under the tongue every 5 (five) minutes as needed. For chest pain      . omeprazole (PRILOSEC) 20 MG capsule Take 20 mg by mouth daily.        Marland Kitchen oxyCODONE-acetaminophen (PERCOCET/ROXICET) 5-325 MG per tablet Take 1-2 tablets by mouth every 6 (six) hours as needed for pain.  90 tablet  0  . potassium chloride SA (KLOR-CON M20) 20 MEQ tablet Take 1 tablet (20 mEq total) by mouth daily.  90 tablet  3  . pravastatin (PRAVACHOL) 40 MG tablet Take 40 mg by mouth every morning.       . traMADol (ULTRAM) 50 MG tablet Take 1 tablet (50 mg total) by mouth every 6 (six) hours as needed. As needed for pain.  30 tablet  11  . [DISCONTINUED] buPROPion (WELLBUTRIN XL) 150 MG 24 hr tablet Take 1 tablet (150 mg total) by mouth 2 (two) times daily.  60 tablet  5   No current facility-administered medications for this visit.   Facility-Administered Medications Ordered in Other Visits  Medication Dose Route Frequency Provider Last Rate Last Dose  . heparin lock flush 100 unit/mL  500 Units Intracatheter Once PRN Exie Parody, MD      . sodium chloride 0.9 % injection 10 mL  10 mL Intracatheter PRN Exie Parody, MD        ALLERGIES:  is allergic to lisinopril.  REVIEW OF SYSTEMS:  The rest of the 14-point review of system was negative.   Filed Vitals:    06/23/12 1210  BP: 109/69  Pulse: 91  Temp: 97.8 F (36.6 C)  Resp: 20   Wt Readings from Last 3 Encounters:  06/23/12 160 lb (72.576 kg)  06/02/12 163 lb 12.8 oz (74.299 kg)  05/09/12 163 lb 9.6 oz (74.208 kg)   ECOG Performance status: 1  PHYSICAL EXAMINATION:   General:  Thin-appearing woman, in no acute distress.  Eyes:  no scleral icterus.  ENT:  There were no oropharyngeal lesions.  Neck was without thyromegaly.  Lymphatics:  Negative cervical, supraclavicular or axillary adenopathy.  Respiratory: lungs were clear bilaterally without wheezing or crackles.  Cardiovascular:  Regular rate and rhythm, S1/S2, without murmur,  rub or gallop.  There was no pedal edema.  GI:  abdomen was soft, flat, nontender, nondistended, without organomegaly.  Muscoloskeletal:  no spinal tenderness of palpation of vertebral spine.  Skin exam was without echymosis, petichae.  Neuro exam was nonfocal.Patient was able to get on and off exam table without assistance.  Gait was normal.  Patient was alerted and oriented.  Attention was good.   Language was appropriate.  Mood was normal without depression.  Speech was not pressured.  Thought content was not tangential.     LABORATORY/RADIOLOGY DATA:  Lab Results  Component Value Date   WBC 5.0 06/23/2012   HGB 11.4* 06/23/2012   HCT 36.2 06/23/2012   PLT 308 06/23/2012   GLUCOSE 118* 06/23/2012   CHOL 198 03/21/2012   TRIG 104 03/21/2012   HDL 53 03/21/2012   LDLDIRECT 111* 02/27/2010   LDLCALC 124* 03/21/2012   ALKPHOS 106 06/23/2012   ALT 7 06/23/2012   AST 9 06/23/2012   NA 142 06/23/2012   K 3.2* 06/23/2012   CL 105 06/23/2012   CREATININE 0.6 06/23/2012   BUN 8.3 06/23/2012   CO2 27 06/23/2012   INR 1.10 10/19/2011   IMAGING:   Findings: Normal signal is present in the conus medullaris which  terminates at T12, within normal limits. A chronic stress fracture  is evident within the left pedicle at L4. Chronic end plate  degenerative changes at L5-S1 has progressed.  Hemangiomas at L5  and S1 are stable.  Limited imaging of the abdomen is unremarkable.  The disc levels at L2-3 and above are normal.  L3-4: Moderate right and mild left facet hypertrophy is present.  There is a posterior synovial cyst on the right at L3-4 with  inflammatory changes extending inferiorly.  L4-5: A shallow leftward disc protrusion is noted with a small  annular tear. This results in mild left lateral recess narrowing  and minimal foraminal stenosis.  L5-S1: The patient is status post left hemilaminectomy. There is  no residual disc herniation in the left lateral recess. Mild left  foraminal narrowing is present. Left-sided facet hypertrophy  contributes.  Incidental note is made of fatty infiltration of the filum  terminalis without a discrete mass or dysraphism. This is stable.  IMPRESSION:  1. Asymmetric right-sided facet hypertrophy at L3-4 with a  posterior synovial cyst and surrounding inflammatory changes. This  likely accounts for the uptake on nuclear medicine bone scan.  2. No evidence for metastatic disease.  3. Mild left lateral recess and minimal foraminal narrowing at L4-  5.  4. Postsurgical changes on the left at L5-S1 with mild residual  left foraminal narrowing.  5. Stable fatty infiltration of the filum terminalis without a  discrete mass.    ASSESSMENT AND PLAN:   1. Recurrent Lung cancer now with brain med:  - s/p brain radiation.  - she has on maintenance Alimta since 02/2012.  She is tolerating it well without dose limiting toxicity.  She inquired how long she needs to be on this.  My recommendation is indefinite until she has disease progression or severe toxicities.  She agreed with this recommendation and would like to continue.  We will address this again in 01/2013.   2.  Back pain with questionable uptake on bone scan: MRI did not show bone met.  I recommended her to take pain med as needed.  If pain worsens, she can be referred to  NeuroSurgery.   3. History of colon cancer: She  has no evidence of recurrent or metastatic disease for her colon cancer. Surveillance for her history of colon cancer entails once-a-year CT scan for 5 years' total.  She had a surveillance colonoscopy in October 2012 by Dr Christella Hartigan, which showed polyps. Per his recommendation, the next colonoscopy is due in Oct 2015 but sooner if she has symptoms.   4. Hypertension: She is on Bystolic, Catapres, HCTZ and per PCP.   5. Hypercholesterolemia: She is on pravastatin per PCP.   6. Anorexia: stable weight for now.   8. Follow up: in about 3 weeks.   9. CODE STATUS:  FULL CODE per previous discussion.      The length of time of the face-to-face encounter was 25 minutes. More than 50% of time was spent counseling and coordination of care.

## 2012-06-28 ENCOUNTER — Encounter: Payer: Self-pay | Admitting: Family Medicine

## 2012-06-28 ENCOUNTER — Ambulatory Visit (INDEPENDENT_AMBULATORY_CARE_PROVIDER_SITE_OTHER): Payer: Medicare Other | Admitting: Family Medicine

## 2012-06-28 VITALS — BP 108/63 | HR 92 | Temp 97.8°F | Ht 64.0 in | Wt 161.0 lb

## 2012-06-28 DIAGNOSIS — F330 Major depressive disorder, recurrent, mild: Secondary | ICD-10-CM

## 2012-06-28 DIAGNOSIS — M47819 Spondylosis without myelopathy or radiculopathy, site unspecified: Secondary | ICD-10-CM

## 2012-06-28 DIAGNOSIS — I10 Essential (primary) hypertension: Secondary | ICD-10-CM

## 2012-06-28 DIAGNOSIS — M479 Spondylosis, unspecified: Secondary | ICD-10-CM

## 2012-06-28 MED ORDER — TRAMADOL HCL 50 MG PO TABS: 50.0000 mg | ORAL_TABLET | Freq: Four times a day (QID) | ORAL | Status: AC | PRN

## 2012-06-28 NOTE — Assessment & Plan Note (Signed)
Stable, patient has not been taking any antidepressants in some time, she is not interested in medications now.

## 2012-06-28 NOTE — Assessment & Plan Note (Signed)
Discussed that cyst in back is from inflammation from arthritis.  She has no interest in steroid injections or back surgery at this time.  Discussed home exercise program (declined PT), hand out given.  Continue tramadol prn pain.  Dr. Gaylyn Rong fills her MS contin.  Discussed it is safest to have one physician write al narcotics.

## 2012-06-28 NOTE — Patient Instructions (Signed)
It was good to see you.  You have arthritis in your back.  You can take pain medication when it hurts.  I recommend doing the stretches and exercises on the attached hand out twice a day to help strengthen your back.  If you back pain becomes so bad you would consider surgery or steroid injections in your spine, please let me know we can send you to neurosurgery to discuss this.

## 2012-06-28 NOTE — Progress Notes (Signed)
  Subjective:    Patient ID: Lauren [name], female    DOB: February 06, 1951, 62 y.o.   MRN: [id number]  HPI: Lauren [name] comes in for follow up.    Back Pain: She has questions about her back MRI.  Dr. Gaylyn Rong got one due to increased uptake on a PET scan, but MRI showed inflammation and a cyst at L3-L4.  She has been having a lot of back pain and is taking tramdol or the pain, with occasional morphine.  No radicular symptoms, LE weakness or numbness, incontinence.   Depression: she says she is doing OK, she is feeling tired a lot with the Chemo but still able to do what she needs to do.  No SI/HI, denies depressed mood.    HTN: taking BP medications, no chest pain, dyspnea, LE edema.   Past Medical History  Diagnosis Date  . Depression   . Hyperlipidemia   . Hypertension   . Lung nodule     FNA ordered for 04/02/10 by HA>pos Ca  . Chronic folliculitis     of groin  . Fibrocystic breast changes   . Family history of trichomonal vaginitis 05/2005  . Postmenopausal   . Depressive disorder   . Hypercholesterolemia   . GERD (gastroesophageal reflux disease)   . Cerebral aneurysm   . Back pain   . Shortness of breath   . COPD (chronic obstructive pulmonary disease)   . Coronary artery disease   . Arthritis   . Weight loss 10/22/2011  . Status post radiation therapy 11/16/11 - 12/29/11    Right Lung and Mediastinum: 60 Gy  . Status post chemotherapy comp. 12/29/11    Carboplatin/Taxol  . Colon cancer 11/08  . Lung cancer 06/16/10    PET scan 04/28/2010; primary: increase in size 02/2010  . Brain metastases 02/15/12    History  Substance Use Topics  . Smoking status: Current Every Day Smoker -- 1.00 packs/day    Types: Cigarettes  . Smokeless tobacco: Current User  . Alcohol Use: 0.0 oz/week     Comment: occasional    Family History  Problem Relation Age of Onset  . Stomach cancer Maternal Aunt   . Breast cancer Cousin   . Cancer Sister     Lymphatic  . Osteoarthritis Father   . Gout  Father   . Hypertension Father   . Heart disease Mother     pericarditis;   . Anesthesia problems Neg Hx      ROS Pertinent items in HPI    Objective:  Physical Exam:  BP 108/63  Pulse 92  Temp(Src) 97.8 F (36.6 C) (Oral)  Ht 5\' 4"  (1.626 m)  Wt 161 lb (73.029 kg)  BMI 27.62 kg/m2 General appearance: alert, cooperative and no distress Head: Normocephalic, without obvious abnormality, atraumatic Lungs: clear to auscultation bilaterally Heart: regular rate and rhythm, S1, S2 normal, no murmur, click, rub or gallop Pulses: 2+ and symmetric  Back: Normal Curvature, no deformities or CVA tenderness Lumbar spinal Tenderness: over L3 LE Strength 5/5 LE Sensation: in tact LE Reflexes 2+ and symmetric       Assessment & Plan:

## 2012-06-28 NOTE — Assessment & Plan Note (Signed)
Well controlled.  May need to de-escalate medications if she continues to lose weight with chemo.

## 2012-07-05 ENCOUNTER — Telehealth: Payer: Self-pay | Admitting: *Deleted

## 2012-07-05 NOTE — Telephone Encounter (Signed)
Pt requesting refill on ms contin.  She saw her PCP who told her to get all narcotic rxs from one doctor and since Dr. Gaylyn Rong initiated the ms contin,  Pt was instructed to get refills from Dr. Gaylyn Rong.  Office visit note from Dr. Lula Olszewski confirms this.  Informed pt this RN will ask Dr. Gaylyn Rong and call her back.

## 2012-07-05 NOTE — Telephone Encounter (Signed)
Please contact patient.  Her bone pain is not due to cancer.  I will not be able to continue MS Contin because it is not standard of care.   I only gave her temporary supply of MS Contin until her Primary doctor can get involve.   Thanks.

## 2012-07-05 NOTE — Telephone Encounter (Signed)
Dr. Gaylyn Rong left VM for Dr. Lula Olszewski requesting her management of pt's arthritis pain.  Informed pt of Dr. Lodema Pilot response below, that her osteo arthritis needs to be managed by PCP.  Dr. Gaylyn Rong will treat her cancer and any cancer related issues.   Pt verbalized understanding and will contact Dr. Melina Modena office tomorrow.

## 2012-07-05 NOTE — Telephone Encounter (Signed)
Dr. Gaylyn Rong left message on Physician line for Dr. Lula Olszewski.  He states that he was able to give patient some MS Contin for her bone pain thinking it was metastatic disease from her lung cancer.  But Lauren [name]'s bone scan was negative so the pain is not due to cancer.  It is the philosophy of the cancer center not to prescribe pain medicines if the pain is not associated with cancer.  Dr. Si Gaul appreciates your expertise dealing with her osteoarthritic pain and whatever you feel appropriate for her pain, he will defer that to you.  Page him with any questions at [phone].  Ileana Ladd

## 2012-07-06 NOTE — Telephone Encounter (Signed)
Please call patient back, let her know that MS contin is not, in my medical opinion, an appropriate treatment for chronic back pain due to arthritis.  I am not willing to prescribe chronic narcotics for her.   Lauren [name] 07/06/2012 8:01 AM

## 2012-07-06 NOTE — Telephone Encounter (Signed)
Advised pt that Dr Lula Olszewski did not feel narcotics are the best tx for chronic back pain and that she could sched an OV to discuss further if she wanted. Pt voiced understanding.

## 2012-07-11 ENCOUNTER — Other Ambulatory Visit (HOSPITAL_BASED_OUTPATIENT_CLINIC_OR_DEPARTMENT_OTHER): Payer: Medicare Other | Admitting: Lab

## 2012-07-11 ENCOUNTER — Ambulatory Visit (HOSPITAL_BASED_OUTPATIENT_CLINIC_OR_DEPARTMENT_OTHER): Payer: Medicare Other | Admitting: Oncology

## 2012-07-11 ENCOUNTER — Encounter: Payer: Self-pay | Admitting: Oncology

## 2012-07-11 ENCOUNTER — Other Ambulatory Visit: Payer: Self-pay | Admitting: Oncology

## 2012-07-11 ENCOUNTER — Ambulatory Visit (HOSPITAL_BASED_OUTPATIENT_CLINIC_OR_DEPARTMENT_OTHER): Payer: Medicare Other

## 2012-07-11 ENCOUNTER — Telehealth: Payer: Self-pay | Admitting: Dietician

## 2012-07-11 DIAGNOSIS — C3410 Malignant neoplasm of upper lobe, unspecified bronchus or lung: Secondary | ICD-10-CM

## 2012-07-11 DIAGNOSIS — C7949 Secondary malignant neoplasm of other parts of nervous system: Secondary | ICD-10-CM

## 2012-07-11 DIAGNOSIS — C3490 Malignant neoplasm of unspecified part of unspecified bronchus or lung: Secondary | ICD-10-CM

## 2012-07-11 DIAGNOSIS — C7931 Secondary malignant neoplasm of brain: Secondary | ICD-10-CM

## 2012-07-11 DIAGNOSIS — Z5111 Encounter for antineoplastic chemotherapy: Secondary | ICD-10-CM

## 2012-07-11 DIAGNOSIS — Z85038 Personal history of other malignant neoplasm of large intestine: Secondary | ICD-10-CM

## 2012-07-11 LAB — CBC WITH DIFFERENTIAL/PLATELET
Basophils Absolute: 0 10*3/uL (ref 0.0–0.1)
EOS%: 1.6 % (ref 0.0–7.0)
Eosinophils Absolute: 0.1 10*3/uL (ref 0.0–0.5)
HGB: 12.4 g/dL (ref 11.6–15.9)
MCH: 25.1 pg (ref 25.1–34.0)
MCV: 79.2 fL — ABNORMAL LOW (ref 79.5–101.0)
MONO%: 12.9 % (ref 0.0–14.0)
NEUT#: 4 10*3/uL (ref 1.5–6.5)
RBC: 4.95 10*6/uL (ref 3.70–5.45)
RDW: 16 % — ABNORMAL HIGH (ref 11.2–14.5)
lymph#: 0.7 10*3/uL — ABNORMAL LOW (ref 0.9–3.3)
nRBC: 0 % (ref 0–0)

## 2012-07-11 MED ORDER — CYANOCOBALAMIN 1000 MCG/ML IJ SOLN
1000.0000 ug | INTRAMUSCULAR | Status: DC
  Administered 2012-07-11: 1000 ug via INTRAMUSCULAR

## 2012-07-11 MED ORDER — SODIUM CHLORIDE 0.9 % IV SOLN
500.0000 mg/m2 | Freq: Once | INTRAVENOUS | Status: AC
  Administered 2012-07-11: 925 mg via INTRAVENOUS
  Filled 2012-07-11: qty 37

## 2012-07-11 MED ORDER — HEPARIN SOD (PORK) LOCK FLUSH 100 UNIT/ML IV SOLN
500.0000 [IU] | Freq: Once | INTRAVENOUS | Status: AC | PRN
  Administered 2012-07-11: 500 [IU]
  Filled 2012-07-11: qty 5

## 2012-07-11 MED ORDER — SODIUM CHLORIDE 0.9 % IJ SOLN
10.0000 mL | INTRAMUSCULAR | Status: DC | PRN
  Administered 2012-07-11: 10 mL
  Filled 2012-07-11: qty 10

## 2012-07-11 MED ORDER — DEXAMETHASONE SODIUM PHOSPHATE 10 MG/ML IJ SOLN
10.0000 mg | Freq: Once | INTRAMUSCULAR | Status: AC
  Administered 2012-07-11: 10 mg via INTRAVENOUS

## 2012-07-11 MED ORDER — ONDANSETRON 8 MG/50ML IVPB (CHCC)
8.0000 mg | Freq: Once | INTRAVENOUS | Status: AC
  Administered 2012-07-11: 8 mg via INTRAVENOUS

## 2012-07-11 MED ORDER — SODIUM CHLORIDE 0.9 % IV SOLN
Freq: Once | INTRAVENOUS | Status: AC
  Administered 2012-07-11: 09:00:00 via INTRAVENOUS

## 2012-07-11 NOTE — Telephone Encounter (Signed)
Nutrition Note  Patient has been identified to be at risk on malnutrition screen.  Wt Readings from Last 10 Encounters:  07/11/12 158 lb 6.4 oz (71.85 kg)  06/28/12 161 lb (73.029 kg)  06/23/12 160 lb (72.576 kg)  06/02/12 163 lb 12.8 oz (74.299 kg)  05/09/12 163 lb 9.6 oz (74.208 kg)  04/21/12 163 lb 8 oz (74.163 kg)  04/20/12 163 lb (73.936 kg)  04/18/12 162 lb 8 oz (73.71 kg)  04/04/12 168 lb 6.4 oz (76.386 kg)  03/28/12 164 lb 12.8 oz (74.753 kg)    Called and spoke with patient who stated that she is only eating about 1 meal a day and drinking 1 Ensure daily.  States that she does not have an appetite.  Encouraged patient to eat several small meals daily and increase her Ensure intake to 2 daily.  Discussed ways to increase calories and protein in diet.    Patient wishes to have an appointment with [facility].  Will mail coupons for Ensure along with handout on increasing calories and protein in the diet.  Will also arrange an appointment with [facility].  Oran Rein, RD, LDN

## 2012-07-11 NOTE — Progress Notes (Signed)
[facility]  Telephone:(336) [phone] Fax:(336) [phone]   OFFICE PROGRESS NOTE   Cc:  CHAMBERLAIN,RACHEL, MD  PAST DIAGNOSIS AND PAST THERAPY:  1. 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 Her post resection surveillance colonoscopy onOctober 14, 2009, by Dr. Christella Hartigan was negative. 2. pT1a, pN0 well differentiated adenocarcinoma of lung; s/p left upper lobectomy on 06/16/2010. 3. 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  4. 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  She underwent stereotactic radiosurgery.  The left occipital 15mm target was treated using 4 Dynamic Conformal Arcs to a prescription dose of 20 Gy in 1 fraction. 6 MV photons, flattening filter free, were used. The right insula 4mm target was treated using 3 Circular Arcs to a prescription dose of 20 Gy in 1 fraction.  6 MV photons, flattening filter free, were used  CURRENT THERAPY: started maintenance chemo Alimta q3 weeks on 03/07/2012.    INTERVAL HISTORY: Lauren [name] 62 y.o. female returns for regular follow up with her daughter.  She still has mid-lower back pain, but better with regular use of Tramadol and Voltaren gel.  Pain is crampy, mild; no radiating; no leg weakness/paresthesia/ bowel or bladder incontinence.  She is tolerating chemo Alimta well without problem.  Patient denies fever, anorexia, weight loss, fatigue, headache, visual changes, confusion, drenching night sweats, palpable lymph node swelling, mucositis, odynophagia, dysphagia, nausea vomiting, jaundice, chest pain, palpitation, shortness of breath, dyspnea on exertion, productive cough, gum bleeding, epistaxis,  hematemesis, hemoptysis, abdominal pain, abdominal swelling, early satiety, melena, hematochezia, hematuria, skin rash, spontaneous bleeding, joint swelling, joint pain, heat or cold intolerance, bowel bladder incontinence, back pain, focal motor weakness, paresthesia, depression.       Past Medical History  Diagnosis Date  . Depression   . Hyperlipidemia   . Hypertension   . Lung nodule     FNA ordered for 04/02/10 by HA>pos Ca  . Chronic folliculitis     of groin  . Fibrocystic breast changes   . Family history of trichomonal vaginitis 05/2005  . Postmenopausal   . Depressive disorder   . Hypercholesterolemia   . GERD (gastroesophageal reflux disease)   . Cerebral aneurysm   . Back pain   . Shortness of breath   . COPD (chronic obstructive pulmonary disease)   . Coronary artery disease   . Arthritis   . Weight loss 10/22/2011  . Status post radiation therapy 11/16/11 - 12/29/11    Right Lung and Mediastinum: 60 Gy  . Status post chemotherapy comp. 12/29/11    Carboplatin/Taxol  . Colon cancer 11/08  . Lung cancer 06/16/10    PET scan 04/28/2010; primary: increase in size 02/2010  . Brain metastases 02/15/12    Past Surgical History  Procedure Laterality Date  . Colectomy  03/22/07  . Tubal ligation    . Lung lobectomy  06/16/10  . Hernia repair    . Breast surgery      Bil lumpectomy  . Back surgery    . Mediastinoscopy  10/19/2011    Procedure: MEDIASTINOSCOPY;  Surgeon: Alleen Borne, MD;  Location: [facility] OR;  Service: Thoracic;  Laterality: N/A;  . Cardiac cath x3      Current Outpatient  Prescriptions  Medication Sig Dispense Refill  . albuterol (PROVENTIL HFA;VENTOLIN HFA) 108 (90 BASE) MCG/ACT inhaler Inhale 2 puffs into the lungs every 6 (six) hours as needed. For shortness of breath  2 Inhaler  11  . aspirin 81 MG EC tablet Take 81 mg by mouth every morning.       Marland Kitchen BYSTOLIC 10 MG tablet TAKE 1 TABLET BY MOUTH ONCE DAILY  30 tablet  5  . cloNIDine (CATAPRES) 0.1 MG  tablet Take 0.1 mg by mouth at bedtime.      . diclofenac sodium (VOLTAREN) 1 % GEL Apply 2 g topically 4 (four) times daily. As needed for pain  100 g  11  . folic acid (FOLVITE) 1 MG tablet TAKE 1 TABLET BY MOUTH ONCE DAILY  30 tablet  1  . hydrochlorothiazide (HYDRODIURIL) 25 MG tablet TAKE 1 TABLET BY MOUTH ONCE DAILY  30 tablet  11  . lidocaine-prilocaine (EMLA) cream Apply topically as needed. Apply to porta cath site one hour prior to needle stick.  30 g  2  . nitroGLYCERIN (NITROSTAT) 0.4 MG SL tablet Place 0.4 mg under the tongue every 5 (five) minutes as needed. For chest pain      . omeprazole (PRILOSEC) 20 MG capsule Take 20 mg by mouth daily.        Marland Kitchen oxyCODONE-acetaminophen (PERCOCET/ROXICET) 5-325 MG per tablet Take 1-2 tablets by mouth every 6 (six) hours as needed for pain.  90 tablet  0  . potassium chloride SA (KLOR-CON M20) 20 MEQ tablet Take 1 tablet (20 mEq total) by mouth daily.  90 tablet  3  . pravastatin (PRAVACHOL) 40 MG tablet Take 40 mg by mouth every morning.       . traMADol (ULTRAM) 50 MG tablet Take 1 tablet (50 mg total) by mouth every 6 (six) hours as needed. As needed for pain.  90 tablet  11  . [DISCONTINUED] buPROPion (WELLBUTRIN XL) 150 MG 24 hr tablet Take 1 tablet (150 mg total) by mouth 2 (two) times daily.  60 tablet  5   No current facility-administered medications for this visit.   Facility-Administered Medications Ordered in Other Visits  Medication Dose Route Frequency Provider Last Rate Last Dose  . cyanocobalamin ((VITAMIN B-12)) injection 1,000 mcg  1,000 mcg Intramuscular Q30 days Exie Parody, MD   1,000 mcg at 07/11/12 1019  . sodium chloride 0.9 % injection 10 mL  10 mL Intracatheter PRN Exie Parody, MD   10 mL at 07/11/12 1021    ALLERGIES:  is allergic to lisinopril.  REVIEW OF SYSTEMS:  The rest of the 14-point review of system was negative.   Filed Vitals:   07/11/12 0841  BP: 120/64  Pulse: 77  Temp: 97.7 F (36.5 C)  Resp: 20    Wt Readings from Last 3 Encounters:  07/11/12 158 lb 6.4 oz (71.85 kg)  06/28/12 161 lb (73.029 kg)  06/23/12 160 lb (72.576 kg)   ECOG Performance status: 1  PHYSICAL EXAMINATION:   General:  Thin-appearing woman, in no acute distress.  Eyes:  no scleral icterus.  ENT:  There were no oropharyngeal lesions.  Neck was without thyromegaly.  Lymphatics:  Negative cervical, supraclavicular or axillary adenopathy.  Respiratory: lungs were clear bilaterally without wheezing or crackles.  Cardiovascular:  Regular rate and rhythm, S1/S2, without murmur, rub or gallop.  There was no pedal edema.  GI:  abdomen was soft, flat, nontender, nondistended, without organomegaly.  Muscoloskeletal:  no  spinal tenderness of palpation of vertebral spine.  Skin exam was without echymosis, petichae.  Neuro exam was nonfocal.Patient was able to get on and off exam table without assistance.  Gait was normal.  Patient was alerted and oriented.  Attention was good.   Language was appropriate.  Mood was normal without depression.  Speech was not pressured.  Thought content was not tangential.     LABORATORY/RADIOLOGY DATA:  Lab Results  Component Value Date   WBC 5.5 07/11/2012   HGB 12.4 07/11/2012   HCT 39.2 07/11/2012   PLT 263 07/11/2012   GLUCOSE 118* 06/23/2012   CHOL 198 03/21/2012   TRIG 104 03/21/2012   HDL 53 03/21/2012   LDLDIRECT 111* 02/27/2010   LDLCALC 124* 03/21/2012   ALKPHOS 106 06/23/2012   ALT 7 06/23/2012   AST 9 06/23/2012   NA 142 06/23/2012   K 3.2* 06/23/2012   CL 105 06/23/2012   CREATININE 0.6 06/23/2012   BUN 8.3 06/23/2012   CO2 27 06/23/2012   INR 1.10 10/19/2011    ASSESSMENT AND PLAN:   1. Recurrent Lung cancer now with brain med:  - s/p brain radiation.  - she has on maintenance Alimta since 02/2012.  She is tolerating it well without dose limiting toxicity.  She inquired how long she needs to be on this.  My recommendation is indefinite until she has disease progression or severe toxicities.   She agreed with this recommendation and would like to continue.  We will address this again in 01/2013.   2.  Back pain with questionable uptake on bone scan: MRI did not show bone met.  I recommended her to take pain med as needed.  If pain worsens, she can be referred to NeuroSurgery.   3. History of colon cancer: She has no evidence of recurrent or metastatic disease for her colon cancer. Surveillance for her history of colon cancer entails once-a-year CT scan for 5 years' total.  She had a surveillance colonoscopy in October 2012 by Dr Christella Hartigan, which showed polyps. Per his recommendation, the next colonoscopy is due in Oct 2015 but sooner if she has symptoms.   4. Hypertension: She is on Bystolic, Catapres, HCTZ and per PCP.   5. Hypercholesterolemia: She is on pravastatin per PCP.   6. Anorexia: stable weight for now.   8. Follow up: in about 3 weeks.   9. CODE STATUS:  FULL CODE per previous discussion.      The length of time of the face-to-face encounter was 15 minutes. More than 50% of time was spent counseling and coordination of care.

## 2012-07-11 NOTE — Patient Instructions (Addendum)
[facility] Discharge Instructions for Patients Receiving Chemotherapy  Today you received the following chemotherapy agent Alimta  To help prevent nausea and vomiting after your treatment, we encourage you to take your nausea medication. Begin taking your nausea medication as often as prescribed for by Dr. Gaylyn Rong.    If you develop nausea and vomiting that is not controlled by your nausea medication, call the clinic. If it is after clinic hours your family physician or the after hours number for the clinic or go to the Emergency Department.   BELOW ARE SYMPTOMS THAT SHOULD BE REPORTED IMMEDIATELY:  *FEVER GREATER THAN 100.5 F  *CHILLS WITH OR WITHOUT FEVER  NAUSEA AND VOMITING THAT IS NOT CONTROLLED WITH YOUR NAUSEA MEDICATION  *UNUSUAL SHORTNESS OF BREATH  *UNUSUAL BRUISING OR BLEEDING  TENDERNESS IN MOUTH AND THROAT WITH OR WITHOUT PRESENCE OF ULCERS  *URINARY PROBLEMS  *BOWEL PROBLEMS  UNUSUAL RASH Items with * indicate a potential emergency and should be followed up as soon as possible.  One of the nurses will contact you 24 hours after your treatment. Please let the nurse know about any problems that you may have experienced. Feel free to call the clinic you have any questions or concerns. The clinic phone number is [phone].   I have been informed and understand all the instructions given to me. I know to contact the clinic, my physician, or go to the Emergency Department if any problems should occur. I do not have any questions at this time, but understand that I may call the clinic during office hours   should I have any questions or need assistance in obtaining follow up care.    __________________________________________  _____________  __________ Signature of Patient or Authorized Representative            Date                   Time    __________________________________________ Nurse's Signature   CYANOCOBALAMIN (sye an oh koe BAL a min) is  a man made form of vitamin B12. Vitamin B12 is used in the growth of healthy blood cells, nerve cells, and proteins in the body. It also helps with the metabolism of fats and carbohydrates. This medicine is used to treat people who can not absorb vitamin B12. This medicine may be used for other purposes; ask your health care provider or pharmacist if you have questions. What should I tell my health care provider before I take this medicine? They need to know if you have any of these conditions: -kidney disease -Leber's disease -megaloblastic anemia -an unusual or allergic reaction to cyanocobalamin, cobalt, other medicines, foods, dyes, or preservatives -pregnant or trying to get pregnant -breast-feeding How should I use this medicine? This medicine is injected into a muscle or deeply under the skin. It is usually given by a health care professional in a clinic or doctor's office. However, your doctor may teach you how to inject yourself. Follow all instructions. Talk to your pediatrician regarding the use of this medicine in children. Special care may be needed. Overdosage: If you think you have taken too much of this medicine contact a poison control center or emergency room at once. NOTE: This medicine is only for you. Do not share this medicine with others. What if I miss a dose? If you are given your dose at a clinic or doctor's office, call to reschedule your appointment. If you give your own injections and you miss a  dose, take it as soon as you can. If it is almost time for your next dose, take only that dose. Do not take double or extra doses. What may interact with this medicine? -colchicine -heavy alcohol intake This list may not describe all possible interactions. Give your health care provider a list of all the medicines, herbs, non-prescription drugs, or dietary supplements you use. Also tell them if you smoke, drink alcohol, or use illegal drugs. Some items may interact with your  medicine. What should I watch for while using this medicine? Visit your doctor or health care professional regularly. You may need blood work done while you are taking this medicine. You may need to follow a special diet. Talk to your doctor. Limit your alcohol intake and avoid smoking to get the best benefit. What side effects may I notice from receiving this medicine? Side effects that you should report to your doctor or health care professional as soon as possible: -allergic reactions like skin rash, itching or hives, swelling of the face, lips, or tongue -blue tint to skin -chest tightness, pain -difficulty breathing, wheezing -dizziness -red, swollen painful area on the leg Side effects that usually do not require medical attention (report to your doctor or health care professional if they continue or are bothersome): -diarrhea -headache This list may not describe all possible side effects. Call your doctor for medical advice about side effects. You may report side effects to FDA at 1-800-FDA-1088. Where should I keep my medicine? Keep out of the reach of children. Store at room temperature between 15 and 30 degrees C (59 and 85 degrees F). Protect from light. Throw away any unused medicine after the expiration date. NOTE: This sheet is a summary. It may not cover all possible information. If you have questions about this medicine, talk to your doctor, pharmacist, or health care provider.  2012, [employer]vier/Gold Standard. (08/15/2007 10:10:20 PM)

## 2012-07-12 ENCOUNTER — Telehealth: Payer: Self-pay | Admitting: Radiation Oncology

## 2012-07-12 NOTE — Telephone Encounter (Signed)
INDIGENT APPROVED 95% FAMILY SIZE: 1 HH INC: 14712.00 MOD POV: 14,587.50-15462.75 VALID DATES: 06/28/2012-12/26/2012 95% INDIGENT - PLEASE APPLY DISCOUNT TO ANY  6 MONTHS PRIOR AND ALL CURRENT BILL.

## 2012-07-15 ENCOUNTER — Encounter: Payer: Self-pay | Admitting: Radiation Oncology

## 2012-08-01 ENCOUNTER — Telehealth: Payer: Self-pay | Admitting: Oncology

## 2012-08-01 ENCOUNTER — Ambulatory Visit (HOSPITAL_BASED_OUTPATIENT_CLINIC_OR_DEPARTMENT_OTHER): Payer: Medicare Other

## 2012-08-01 ENCOUNTER — Ambulatory Visit: Payer: Medicare Other | Admitting: Nutrition

## 2012-08-01 ENCOUNTER — Other Ambulatory Visit (HOSPITAL_BASED_OUTPATIENT_CLINIC_OR_DEPARTMENT_OTHER): Payer: Medicare Other | Admitting: Lab

## 2012-08-01 ENCOUNTER — Ambulatory Visit (HOSPITAL_BASED_OUTPATIENT_CLINIC_OR_DEPARTMENT_OTHER): Payer: Medicare Other | Admitting: Oncology

## 2012-08-01 DIAGNOSIS — C3410 Malignant neoplasm of upper lobe, unspecified bronchus or lung: Secondary | ICD-10-CM

## 2012-08-01 DIAGNOSIS — C3490 Malignant neoplasm of unspecified part of unspecified bronchus or lung: Secondary | ICD-10-CM

## 2012-08-01 DIAGNOSIS — R630 Anorexia: Secondary | ICD-10-CM

## 2012-08-01 DIAGNOSIS — C7949 Secondary malignant neoplasm of other parts of nervous system: Secondary | ICD-10-CM

## 2012-08-01 DIAGNOSIS — C7931 Secondary malignant neoplasm of brain: Secondary | ICD-10-CM

## 2012-08-01 DIAGNOSIS — Z5111 Encounter for antineoplastic chemotherapy: Secondary | ICD-10-CM

## 2012-08-01 DIAGNOSIS — Z85038 Personal history of other malignant neoplasm of large intestine: Secondary | ICD-10-CM

## 2012-08-01 LAB — CBC WITH DIFFERENTIAL/PLATELET
BASO%: 0.7 % (ref 0.0–2.0)
Basophils Absolute: 0 10*3/uL (ref 0.0–0.1)
EOS%: 1 % (ref 0.0–7.0)
HCT: 38.6 % (ref 34.8–46.6)
HGB: 12.6 g/dL (ref 11.6–15.9)
LYMPH%: 13.1 % — ABNORMAL LOW (ref 14.0–49.7)
MCH: 25.2 pg (ref 25.1–34.0)
MCHC: 32.7 g/dL (ref 31.5–36.0)
MONO#: 0.7 10*3/uL (ref 0.1–0.9)
NEUT%: 71.6 % (ref 38.4–76.8)
Platelets: 276 10*3/uL (ref 145–400)
lymph#: 0.7 10*3/uL — ABNORMAL LOW (ref 0.9–3.3)

## 2012-08-01 LAB — COMPREHENSIVE METABOLIC PANEL (CC13)
ALT: 11 U/L (ref 0–55)
BUN: 9.5 mg/dL (ref 7.0–26.0)
CO2: 27 mEq/L (ref 22–29)
Calcium: 9.5 mg/dL (ref 8.4–10.4)
Chloride: 105 mEq/L (ref 98–107)
Creatinine: 0.7 mg/dL (ref 0.6–1.1)
Total Bilirubin: 0.46 mg/dL (ref 0.20–1.20)

## 2012-08-01 MED ORDER — SODIUM CHLORIDE 0.9 % IJ SOLN
10.0000 mL | INTRAMUSCULAR | Status: DC | PRN
  Administered 2012-08-01: 10 mL
  Filled 2012-08-01: qty 10

## 2012-08-01 MED ORDER — ONDANSETRON 8 MG/50ML IVPB (CHCC)
8.0000 mg | Freq: Once | INTRAVENOUS | Status: AC
  Administered 2012-08-01: 8 mg via INTRAVENOUS

## 2012-08-01 MED ORDER — HEPARIN SOD (PORK) LOCK FLUSH 100 UNIT/ML IV SOLN
500.0000 [IU] | Freq: Once | INTRAVENOUS | Status: AC | PRN
  Administered 2012-08-01: 500 [IU]
  Filled 2012-08-01: qty 5

## 2012-08-01 MED ORDER — DEXAMETHASONE SODIUM PHOSPHATE 10 MG/ML IJ SOLN
10.0000 mg | Freq: Once | INTRAMUSCULAR | Status: AC
  Administered 2012-08-01: 10 mg via INTRAVENOUS

## 2012-08-01 MED ORDER — CYANOCOBALAMIN 1000 MCG/ML IJ SOLN: 1000.0000 ug | INTRAMUSCULAR | Status: DC

## 2012-08-01 MED ORDER — SODIUM CHLORIDE 0.9 % IV SOLN
Freq: Once | INTRAVENOUS | Status: AC
  Administered 2012-08-01: 11:00:00 via INTRAVENOUS

## 2012-08-01 MED ORDER — SODIUM CHLORIDE 0.9 % IV SOLN
500.0000 mg/m2 | Freq: Once | INTRAVENOUS | Status: AC
  Administered 2012-08-01: 925 mg via INTRAVENOUS
  Filled 2012-08-01: qty 37

## 2012-08-01 NOTE — Progress Notes (Signed)
[facility]  Telephone:(336) [phone] Fax:(336) [phone]   OFFICE PROGRESS NOTE   Cc:  CHAMBERLAIN,RACHEL, MD  PAST DIAGNOSIS AND PAST THERAPY:  1. 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 Her post resection surveillance colonoscopy onOctober 14, 2009, by Dr. Christella Hartigan was negative. 2. pT1a, pN0 well differentiated adenocarcinoma of lung; s/p left upper lobectomy on 06/16/2010. 3. 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  4. 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  She underwent stereotactic radiosurgery.  The left occipital 15mm target was treated using 4 Dynamic Conformal Arcs to a prescription dose of 20 Gy in 1 fraction. 6 MV photons, flattening filter free, were used. The right insula 4mm target was treated using 3 Circular Arcs to a prescription dose of 20 Gy in 1 fraction.  6 MV photons, flattening filter free, were used  CURRENT THERAPY: started maintenance chemo Alimta q3 weeks on 03/07/2012.    INTERVAL HISTORY: Lauren [name] 62 y.o. female returns for regular follow up with her daughter.  She again reports low appetite.  Her weight has not decreased significantly.  She has mild fatigue; however, she is still independent of all activities of daily living.  She still has lower back pain which her PCP has been giving her Tramadol with response. She denies fever, headache, visual changes, confusion, drenching night sweats, palpable lymph node swelling, mucositis, odynophagia, dysphagia, nausea vomiting, jaundice, chest pain, palpitation, shortness of breath, dyspnea on exertion, productive cough, gum bleeding, epistaxis, hematemesis, hemoptysis,  abdominal pain, abdominal swelling, early satiety, melena, hematochezia, hematuria, skin rash, spontaneous bleeding, joint swelling, joint pain, heat or cold intolerance, bowel bladder incontinence, focal motor weakness, paresthesia, depression.       Past Medical History  Diagnosis Date  . Depression   . Hyperlipidemia   . Hypertension   . Lung nodule     FNA ordered for 04/02/10 by HA>pos Ca  . Chronic folliculitis     of groin  . Fibrocystic breast changes   . Family history of trichomonal vaginitis 05/2005  . Postmenopausal   . Depressive disorder   . Hypercholesterolemia   . GERD (gastroesophageal reflux disease)   . Cerebral aneurysm   . Back pain   . Shortness of breath   . COPD (chronic obstructive pulmonary disease)   . Coronary artery disease   . Arthritis   . Weight loss 10/22/2011  . Status post radiation therapy 11/16/11 - 12/29/11    Right Lung and Mediastinum: 60 Gy  . Status post chemotherapy comp. 12/29/11    Carboplatin/Taxol  . Colon cancer 11/08  . Lung cancer 06/16/10    PET scan 04/28/2010; primary: increase in size 02/2010  . Brain metastases 02/15/12    Past Surgical History  Procedure Laterality Date  . Colectomy  03/22/07  . Tubal ligation    . Lung lobectomy  06/16/10  . Hernia repair    . Breast surgery      Bil lumpectomy  . Back surgery    . Mediastinoscopy  10/19/2011    Procedure: MEDIASTINOSCOPY;  Surgeon: Alleen Borne, MD;  Location: [facility] OR;  Service: Thoracic;  Laterality: N/A;  . Cardiac cath x3      Current Outpatient Prescriptions  Medication Sig Dispense Refill  . albuterol (PROVENTIL HFA;VENTOLIN HFA) 108 (90 BASE) MCG/ACT inhaler Inhale 2 puffs into the lungs every 6 (six) hours as needed. For shortness of breath  2 Inhaler  11  . aspirin 81 MG EC tablet Take 81 mg by mouth every morning.       Marland Kitchen BYSTOLIC 10 MG tablet TAKE 1 TABLET BY MOUTH ONCE DAILY  30 tablet  5  . cloNIDine (CATAPRES) 0.1 MG tablet Take 0.1 mg by mouth at  bedtime.      . diclofenac sodium (VOLTAREN) 1 % GEL Apply 2 g topically 4 (four) times daily. As needed for pain  100 g  11  . folic acid (FOLVITE) 1 MG tablet TAKE 1 TABLET BY MOUTH ONCE DAILY  30 tablet  1  . hydrochlorothiazide (HYDRODIURIL) 25 MG tablet TAKE 1 TABLET BY MOUTH ONCE DAILY  30 tablet  11  . lidocaine-prilocaine (EMLA) cream Apply topically as needed. Apply to porta cath site one hour prior to needle stick.  30 g  2  . nitroGLYCERIN (NITROSTAT) 0.4 MG SL tablet Place 0.4 mg under the tongue every 5 (five) minutes as needed. For chest pain      . omeprazole (PRILOSEC) 20 MG capsule Take 20 mg by mouth daily.        Marland Kitchen oxyCODONE-acetaminophen (PERCOCET/ROXICET) 5-325 MG per tablet Take 1-2 tablets by mouth every 6 (six) hours as needed for pain.  90 tablet  0  . potassium chloride SA (KLOR-CON M20) 20 MEQ tablet Take 1 tablet (20 mEq total) by mouth daily.  90 tablet  3  . pravastatin (PRAVACHOL) 40 MG tablet Take 40 mg by mouth every morning.       . traMADol (ULTRAM) 50 MG tablet Take 1 tablet (50 mg total) by mouth every 6 (six) hours as needed. As needed for pain.  90 tablet  11  . [DISCONTINUED] buPROPion (WELLBUTRIN XL) 150 MG 24 hr tablet Take 1 tablet (150 mg total) by mouth 2 (two) times daily.  60 tablet  5   No current facility-administered medications for this visit.    ALLERGIES:  is allergic to lisinopril.  REVIEW OF SYSTEMS:  The rest of the 14-point review of system was negative.   Filed Vitals:   08/01/12 0935  BP: 132/73  Pulse: 92  Temp: 97.9 F (36.6 C)  Resp: 20   Wt Readings from Last 3 Encounters:  08/01/12 157 lb 11.2 oz (71.532 kg)  07/11/12 158 lb 6.4 oz (71.85 kg)  06/28/12 161 lb (73.029 kg)   ECOG Performance status: 1  PHYSICAL EXAMINATION:   General:  Thin-appearing woman, in no acute distress.  Eyes:  no scleral icterus.  ENT:  There were no oropharyngeal lesions.  Neck was without thyromegaly.  Lymphatics:  Negative cervical,  supraclavicular or axillary adenopathy.  Respiratory: lungs were clear bilaterally without wheezing or crackles.  Cardiovascular:  Regular rate and rhythm, S1/S2, without murmur, rub or gallop.  There was no pedal edema.  GI:  abdomen was soft, flat, nontender, nondistended, without organomegaly.  Muscoloskeletal:  no spinal tenderness of palpation of vertebral spine.  Skin exam was without echymosis, petichae.  Neuro exam was nonfocal.Patient was able to get on and off exam table without assistance.  Gait was normal.  Patient was alerted and oriented.  Attention was good.   Language was appropriate.  Mood was normal without depression.  Speech was not pressured.  Thought content was not tangential.  LABORATORY/RADIOLOGY DATA:  Lab Results  Component Value Date   WBC 5.3 08/01/2012   HGB 12.6 08/01/2012   HCT 38.6 08/01/2012   PLT 276 08/01/2012   GLUCOSE 104* 08/01/2012   CHOL 198 03/21/2012   TRIG 104 03/21/2012   HDL 53 03/21/2012   LDLDIRECT 111* 02/27/2010   LDLCALC 124* 03/21/2012   ALKPHOS 113 08/01/2012   ALT 11 08/01/2012   AST 15 08/01/2012   NA 142 08/01/2012   K 3.5 08/01/2012   CL 105 08/01/2012   CREATININE 0.7 08/01/2012   BUN 9.5 08/01/2012   CO2 27 08/01/2012   INR 1.10 10/19/2011    ASSESSMENT AND PLAN:   1. Recurrent Lung cancer now with brain med:  - s/p brain radiation.  - she has on maintenance Alimta since 02/2012.  She is tolerating it well with grade 1 anorexia.  She otherwise does not have dose-limiting toxicity from chemo.  I recommended her to continue with Alimta with every 4-5 months restaging CT.  We will readdress in Sep 2014 as to when to stop maintenance Alimta.   2.  Back pain with questionable uptake on bone scan but negative on MRI did not show bone met.  This is due to DJD.  She follows up with her PCP and has Tramadol prn.   3. History of colon cancer: She has no evidence of recurrent or metastatic disease for her colon cancer. Surveillance for her history  of colon cancer entails once-a-year CT scan for 5 years' total.  She had a surveillance colonoscopy in October 2012 by Dr Christella Hartigan, which showed polyps. Per his recommendation, the next colonoscopy is due in Oct 2015 but sooner if she has symptoms.   4. Hypertension: She is on Bystolic, Catapres, HCTZ and per PCP.   5. Hypercholesterolemia: She is on pravastatin per PCP.   6. Anorexia:  She has appointment with Nutrition today.   7. Follow up: in about 3 weeks.   8. CODE STATUS:  FULL CODE per previous discussion.      The length of time of the face-to-face encounter was 15 minutes. More than 50% of time was spent counseling and coordination of care.

## 2012-08-01 NOTE — Patient Instructions (Signed)
Patient aware of next appointment; discharged home with no complaints. 

## 2012-08-01 NOTE — Progress Notes (Signed)
Patient and daughter present to nutrition followup. Patient's weight was documented as 157.7 pounds 08/01/2012. Her weight has been stable over the past month. Her weight has declined from approximately 163 pounds on January 16. Patient reports poor appetite. She denies constipation but states she only goes to the bathroom every 3 days or so. She drinks 2 oral nutrition supplements daily. She continues to eat 1 meal a day. She verbalizes no interest in adding calories to present meals. Patient asks if there is a pill she can take.  Nutrition diagnosis: Unintended weight loss related to poor appetite and inadequate oral intake as evidenced by 5 pound weight loss over the past 2 months.  Intervention: I educated patient on the importance of increased meals and snacks throughout the day adding higher calorie higher protein foods to her intake. I've encouraged increased oral nutrition supplements as they may be easier to consume then more food during the day. Patient's refusing to increase food intake however states she will try to increase oral nutrition supplements to 3 times a day.  Monitoring, evaluation, goals: This will tolerate oral nutrition supplements 3 times a day to minimize further weight loss.  Next visit: Patient has my contact information for questions or concerns.

## 2012-08-01 NOTE — Telephone Encounter (Signed)
Gave pt appt for lab , ML ansd chemo for 08/22/2012 then see Md on 09/12/12 lab, MD and chemo

## 2012-08-02 ENCOUNTER — Other Ambulatory Visit: Payer: Self-pay | Admitting: Lab

## 2012-08-02 ENCOUNTER — Inpatient Hospital Stay (HOSPITAL_COMMUNITY): Admission: RE | Admit: 2012-08-02 | Payer: Self-pay | Source: Ambulatory Visit

## 2012-08-04 ENCOUNTER — Ambulatory Visit: Payer: Self-pay | Admitting: Oncology

## 2012-08-12 ENCOUNTER — Ambulatory Visit: Payer: Self-pay

## 2012-08-15 ENCOUNTER — Other Ambulatory Visit: Payer: Self-pay | Admitting: Oncology

## 2012-08-15 ENCOUNTER — Other Ambulatory Visit: Payer: Self-pay | Admitting: Family Medicine

## 2012-08-15 ENCOUNTER — Telehealth: Payer: Self-pay | Admitting: *Deleted

## 2012-08-15 MED ORDER — FOLIC ACID 1 MG PO TABS: ORAL_TABLET | ORAL | Status: DC

## 2012-08-15 NOTE — Telephone Encounter (Signed)
Pt called for refill on her Folic Acid.  Sent refill to [facility].  She also asked for My Chart to stop sending her messages about smart phone app.  Gave her to number for My Chart tech support.

## 2012-08-17 ENCOUNTER — Telehealth: Payer: Self-pay | Admitting: Oncology

## 2012-08-17 ENCOUNTER — Encounter: Payer: Self-pay | Admitting: Family Medicine

## 2012-08-17 ENCOUNTER — Ambulatory Visit (INDEPENDENT_AMBULATORY_CARE_PROVIDER_SITE_OTHER): Payer: Medicare Other | Admitting: Family Medicine

## 2012-08-17 VITALS — BP 130/77 | HR 91 | Temp 98.0°F | Ht 64.0 in | Wt 152.0 lb

## 2012-08-17 DIAGNOSIS — M479 Spondylosis, unspecified: Secondary | ICD-10-CM

## 2012-08-17 DIAGNOSIS — M47819 Spondylosis without myelopathy or radiculopathy, site unspecified: Secondary | ICD-10-CM

## 2012-08-17 DIAGNOSIS — F330 Major depressive disorder, recurrent, mild: Secondary | ICD-10-CM

## 2012-08-17 DIAGNOSIS — R630 Anorexia: Secondary | ICD-10-CM

## 2012-08-17 MED ORDER — MIRTAZAPINE 15 MG PO TABS: 15.0000 mg | ORAL_TABLET | Freq: Every day | ORAL | Status: DC

## 2012-08-17 MED ORDER — TIZANIDINE HCL 2 MG PO TABS: 2.0000 mg | ORAL_TABLET | Freq: Four times a day (QID) | ORAL | Status: DC | PRN

## 2012-08-17 NOTE — Progress Notes (Signed)
  Subjective:    Patient ID: Lauren [name], female    DOB: 10-24-1950, 62 y.o.   MRN: [id number]  HPI:  Lauren [name] comes in for follow up.  She has a few complaints.   Back pain: is using voltaren gel which helps, but her back tightens up at bed time.  She has not been doing the home exercise program.  She has continued to walk as much as she can but she gets short of breath and it takes a toll on her.    Weight loss/Poor appetite: She is continuing with chemotherapy, and has lost more weight and says she does not feel hungry.  It is hard to force herself to eat.  She does not want to continue to lose weight.  She also says she feels tired a lot.   Depression: Mood is worse lately, she feels down, tired.  She is not currently on any anti-depressant therapy.   Past Medical History  Diagnosis Date  . Depression   . Hyperlipidemia   . Hypertension   . Lung nodule     FNA ordered for 04/02/10 by HA>pos Ca  . Chronic folliculitis     of groin  . Fibrocystic breast changes   . Family history of trichomonal vaginitis 05/2005  . Postmenopausal   . Depressive disorder   . Hypercholesterolemia   . GERD (gastroesophageal reflux disease)   . Cerebral aneurysm   . Back pain   . Shortness of breath   . COPD (chronic obstructive pulmonary disease)   . Coronary artery disease   . Arthritis   . Weight loss 10/22/2011  . Status post radiation therapy 11/16/11 - 12/29/11    Right Lung and Mediastinum: 60 Gy  . Status post chemotherapy comp. 12/29/11    Carboplatin/Taxol  . Colon cancer 11/08  . Lung cancer 06/16/10    PET scan 04/28/2010; primary: increase in size 02/2010  . Brain metastases 02/15/12    History  Substance Use Topics  . Smoking status: Current Every Day Smoker -- 1.00 packs/day    Types: Cigarettes  . Smokeless tobacco: Current User  . Alcohol Use: 0.0 oz/week     Comment: occasional    Family History  Problem Relation Age of Onset  . Stomach cancer Maternal Aunt   .  Breast cancer Cousin   . Cancer Sister     Lymphatic  . Osteoarthritis Father   . Gout Father   . Hypertension Father   . Heart disease Mother     pericarditis;   . Anesthesia problems Neg Hx    ROS Pertinent items in HPI    Objective:  Physical Exam:  BP 130/77  Pulse 91  Temp(Src) 98 F (36.7 C) (Oral)  Ht 5\' 4"  (1.626 m)  Wt 152 lb (68.947 kg)  BMI 26.08 kg/m2 General appearance: alert, cooperative and no distress Head: Normocephalic, without obvious abnormality, atraumatic Lungs: clear to auscultation bilaterally Heart: regular rate and rhythm, S1, S2 normal, no murmur, click, rub or gallop Pulses: 2+ and symmetric   Back: Normal Curvature, no deformities or CVA tenderness Paraspinal Tenderness: Lumbar region bilaterally LE Strength 5/5 LE Sensation: in tact LE Reflexes 2+ and symmetric       Assessment & Plan:

## 2012-08-17 NOTE — Patient Instructions (Signed)
It was good to see you.  For your back pain, try zanaflex, it is a muscle relaxant, it may be especially helpful at bedtime.  Also try doing the back stretches and exercises, continue walking, continue voltaren gel and try heat therapy.   For your mood and appetite- try Mirtazapine, one pull at bedtime daily.  Remember you need to take this every day for it to work.

## 2012-08-17 NOTE — Assessment & Plan Note (Signed)
Rx for Zanaflex, discussed home exercise program, heat therapy.  Continue voltaren gel and tramadol prn.

## 2012-08-17 NOTE — Assessment & Plan Note (Signed)
Associated with chemotherapy.  Will start mirtazapine.  F/U in 3 months.

## 2012-08-17 NOTE — Assessment & Plan Note (Signed)
Will try mirtazapine to help mood and appetite.

## 2012-08-22 ENCOUNTER — Other Ambulatory Visit (HOSPITAL_BASED_OUTPATIENT_CLINIC_OR_DEPARTMENT_OTHER): Payer: No Typology Code available for payment source | Admitting: Lab

## 2012-08-22 ENCOUNTER — Encounter: Payer: Self-pay | Admitting: Oncology

## 2012-08-22 ENCOUNTER — Ambulatory Visit (HOSPITAL_BASED_OUTPATIENT_CLINIC_OR_DEPARTMENT_OTHER): Payer: Medicare Other | Admitting: Oncology

## 2012-08-22 ENCOUNTER — Ambulatory Visit (HOSPITAL_BASED_OUTPATIENT_CLINIC_OR_DEPARTMENT_OTHER): Payer: Medicare Other

## 2012-08-22 DIAGNOSIS — R630 Anorexia: Secondary | ICD-10-CM

## 2012-08-22 DIAGNOSIS — M549 Dorsalgia, unspecified: Secondary | ICD-10-CM

## 2012-08-22 DIAGNOSIS — C3410 Malignant neoplasm of upper lobe, unspecified bronchus or lung: Secondary | ICD-10-CM

## 2012-08-22 DIAGNOSIS — C3490 Malignant neoplasm of unspecified part of unspecified bronchus or lung: Secondary | ICD-10-CM

## 2012-08-22 DIAGNOSIS — C7931 Secondary malignant neoplasm of brain: Secondary | ICD-10-CM

## 2012-08-22 DIAGNOSIS — C7949 Secondary malignant neoplasm of other parts of nervous system: Secondary | ICD-10-CM

## 2012-08-22 DIAGNOSIS — Z5111 Encounter for antineoplastic chemotherapy: Secondary | ICD-10-CM

## 2012-08-22 LAB — CBC WITH DIFFERENTIAL/PLATELET
BASO%: 0.4 % (ref 0.0–2.0)
Eosinophils Absolute: 0.1 10*3/uL (ref 0.0–0.5)
LYMPH%: 21.7 % (ref 14.0–49.7)
MCHC: 31.7 g/dL (ref 31.5–36.0)
MONO#: 0.6 10*3/uL (ref 0.1–0.9)
NEUT#: 2.8 10*3/uL (ref 1.5–6.5)
RBC: 5.22 10*6/uL (ref 3.70–5.45)
RDW: 17.5 % — ABNORMAL HIGH (ref 11.2–14.5)
WBC: 4.5 10*3/uL (ref 3.9–10.3)
lymph#: 1 10*3/uL (ref 0.9–3.3)
nRBC: 0 % (ref 0–0)

## 2012-08-22 LAB — BASIC METABOLIC PANEL (CC13)
CO2: 28 mEq/L (ref 22–29)
Chloride: 107 mEq/L (ref 98–107)
Creatinine: 0.7 mg/dL (ref 0.6–1.1)
Potassium: 3.4 mEq/L — ABNORMAL LOW (ref 3.5–5.1)
Sodium: 144 mEq/L (ref 136–145)

## 2012-08-22 MED ORDER — DEXAMETHASONE SODIUM PHOSPHATE 10 MG/ML IJ SOLN
10.0000 mg | Freq: Once | INTRAMUSCULAR | Status: AC
  Administered 2012-08-22: 10 mg via INTRAVENOUS

## 2012-08-22 MED ORDER — SODIUM CHLORIDE 0.9 % IJ SOLN
10.0000 mL | INTRAMUSCULAR | Status: DC | PRN
  Administered 2012-08-22: 10 mL
  Filled 2012-08-22: qty 10

## 2012-08-22 MED ORDER — SODIUM CHLORIDE 0.9 % IV SOLN
Freq: Once | INTRAVENOUS | Status: AC
  Administered 2012-08-22: 10:00:00 via INTRAVENOUS

## 2012-08-22 MED ORDER — HEPARIN SOD (PORK) LOCK FLUSH 100 UNIT/ML IV SOLN
500.0000 [IU] | Freq: Once | INTRAVENOUS | Status: AC | PRN
  Administered 2012-08-22: 500 [IU]
  Filled 2012-08-22: qty 5

## 2012-08-22 MED ORDER — ONDANSETRON 8 MG/50ML IVPB (CHCC)
8.0000 mg | Freq: Once | INTRAVENOUS | Status: AC
  Administered 2012-08-22: 8 mg via INTRAVENOUS

## 2012-08-22 MED ORDER — SODIUM CHLORIDE 0.9 % IV SOLN
500.0000 mg/m2 | Freq: Once | INTRAVENOUS | Status: AC
  Administered 2012-08-22: 925 mg via INTRAVENOUS
  Filled 2012-08-22: qty 37

## 2012-08-22 NOTE — Progress Notes (Signed)
[facility]  [phone] [phone]   OFFICE PROGRESS NOTE   Cc:  CHAMBERLAIN,RACHEL, MD  PAST DIAGNOSIS AND PAST THERAPY:  1. 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 Her post resection surveillance colonoscopy onOctober 14, 2009, by Dr. Christella Hartigan was negative. 2. pT1a, pN0 well differentiated adenocarcinoma of lung; s/p left upper lobectomy on 06/16/2010. 3. 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  4. 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  She underwent stereotactic radiosurgery.  The left occipital 15mm target was treated using 4 Dynamic Conformal Arcs to a prescription dose of 20 Gy in 1 fraction. 6 MV photons, flattening filter free, were used. The right insula 4mm target was treated using 3 Circular Arcs to a prescription dose of 20 Gy in 1 fraction.  6 MV photons, flattening filter free, were used  CURRENT THERAPY: started maintenance chemo Alimta q3 weeks on 03/07/2012.    INTERVAL HISTORY: Lauren [name] 62 y.o. female returns for regular follow up with her daughter.  She again reports low appetite.  Started on Remeron per PC recently. Has only been on this for about 1 week. Her weight has not decreased significantly.  She has mild fatigue; however, she is still independent of all activities of daily living.  She still has lower back pain which her PCP has been giving her Tramadol and Zanaflex with response. She denies fever, headache, visual changes, confusion, drenching night sweats, palpable lymph node swelling, mucositis, odynophagia, dysphagia, nausea vomiting, jaundice, chest pain, palpitation, shortness of breath, dyspnea  on exertion, productive cough, gum bleeding, epistaxis, hematemesis, hemoptysis, abdominal pain, abdominal swelling, early satiety, melena, hematochezia, hematuria, skin rash, spontaneous bleeding, joint swelling, joint pain, heat or cold intolerance, bowel bladder incontinence, focal motor weakness, paresthesia, depression.       Past Medical History  Diagnosis Date  . Depression   . Hyperlipidemia   . Hypertension   . Lung nodule     FNA ordered for 04/02/10 by HA>pos Ca  . Chronic folliculitis     of groin  . Fibrocystic breast changes   . Family history of trichomonal vaginitis 05/2005  . Postmenopausal   . Depressive disorder   . Hypercholesterolemia   . GERD (gastroesophageal reflux disease)   . Cerebral aneurysm   . Back pain   . Shortness of breath   . COPD (chronic obstructive pulmonary disease)   . Coronary artery disease   . Arthritis   . Weight loss 10/22/2011  . Status post radiation therapy 11/16/11 - 12/29/11    Right Lung and Mediastinum: 60 Gy  . Status post chemotherapy comp. 12/29/11    Carboplatin/Taxol  . Colon cancer 11/08  . Lung cancer 06/16/10    PET scan 04/28/2010; primary: increase in size 02/2010  . Brain metastases 02/15/12    Past Surgical History  Procedure Laterality Date  . Colectomy  03/22/07  . Tubal ligation    . Lung lobectomy  06/16/10  . Hernia repair    . Breast surgery      Bil lumpectomy  . Back surgery    . Mediastinoscopy  10/19/2011    Procedure: MEDIASTINOSCOPY;  Surgeon: Alleen Borne, MD;  Location: [facility] OR;  Service:  Thoracic;  Laterality: N/A;  . Cardiac cath x3      Current Outpatient Prescriptions  Medication Sig Dispense Refill  . albuterol (PROVENTIL HFA;VENTOLIN HFA) 108 (90 BASE) MCG/ACT inhaler Inhale 2 puffs into the lungs every 6 (six) hours as needed. For shortness of breath  2 Inhaler  11  . aspirin 81 MG EC tablet Take 81 mg by mouth every morning.       Marland Kitchen BYSTOLIC 10 MG tablet TAKE 1 TABLET BY MOUTH ONCE DAILY   30 tablet  5  . cloNIDine (CATAPRES) 0.1 MG tablet TAKE 1 TABLET BY MOUTH ONCE DAILY AT BEDTIME.  60 tablet  5  . diclofenac sodium (VOLTAREN) 1 % GEL Apply 2 g topically 4 (four) times daily. As needed for pain  100 g  11  . folic acid (FOLVITE) 1 MG tablet TAKE 1 TABLET BY MOUTH ONCE DAILY  30 tablet  1  . hydrochlorothiazide (HYDRODIURIL) 25 MG tablet TAKE 1 TABLET BY MOUTH ONCE DAILY  30 tablet  11  . lidocaine-prilocaine (EMLA) cream Apply topically as needed. Apply to porta cath site one hour prior to needle stick.  30 g  2  . mirtazapine (REMERON) 15 MG tablet Take 1 tablet (15 mg total) by mouth at bedtime.  30 tablet  1  . nitroGLYCERIN (NITROSTAT) 0.4 MG SL tablet Place 0.4 mg under the tongue every 5 (five) minutes as needed. For chest pain      . omeprazole (PRILOSEC) 20 MG capsule Take 20 mg by mouth daily.        Marland Kitchen oxyCODONE-acetaminophen (PERCOCET/ROXICET) 5-325 MG per tablet Take 1-2 tablets by mouth every 6 (six) hours as needed for pain.  90 tablet  0  . potassium chloride SA (KLOR-CON M20) 20 MEQ tablet Take 1 tablet (20 mEq total) by mouth daily.  90 tablet  3  . pravastatin (PRAVACHOL) 40 MG tablet TAKE 2 TABLETS BY MOUTH AT BEDTIME  60 tablet  5  . tiZANidine (ZANAFLEX) 2 MG tablet Take 1 tablet (2 mg total) by mouth every 6 (six) hours as needed (Back Muscle Spasm).  30 tablet  3  . traMADol (ULTRAM) 50 MG tablet Take 1 tablet (50 mg total) by mouth every 6 (six) hours as needed. As needed for pain.  90 tablet  11  . [DISCONTINUED] buPROPion (WELLBUTRIN XL) 150 MG 24 hr tablet Take 1 tablet (150 mg total) by mouth 2 (two) times daily.  60 tablet  5   No current facility-administered medications for this visit.    ALLERGIES:  is allergic to lisinopril.  REVIEW OF SYSTEMS:  The rest of the 14-point review of system was negative.   Filed Vitals:   08/22/12 0858  BP: 120/75  Pulse: 86  Temp: 98 F (36.7 C)  Resp: 20   Wt Readings from Last 3 Encounters:  08/22/12  155 lb 6.4 oz (70.489 kg)  08/17/12 152 lb (68.947 kg)  08/01/12 157 lb 11.2 oz (71.532 kg)   ECOG Performance status: 1  PHYSICAL EXAMINATION:   General:  Thin-appearing woman, in no acute distress.  Eyes:  no scleral icterus.  ENT:  There were no oropharyngeal lesions.  Neck was without thyromegaly.  Lymphatics:  Negative cervical, supraclavicular or axillary adenopathy.  Respiratory: lungs were clear bilaterally without wheezing or crackles.  Cardiovascular:  Regular rate and rhythm, S1/S2, without murmur, rub or gallop.  There was no pedal edema.  GI:  abdomen was soft, flat, nontender, nondistended, without organomegaly.  Muscoloskeletal:  no spinal tenderness of palpation of vertebral spine.  Skin exam was without echymosis, petichae.  Neuro exam was nonfocal.Patient was able to get on and off exam table without assistance.  Gait was normal.  Patient was alerted and oriented.  Attention was good.   Language was appropriate.  Mood was normal without depression.  Speech was not pressured.  Thought content was not tangential.     LABORATORY/RADIOLOGY DATA:  Lab Results  Component Value Date   WBC 4.5 08/22/2012   HGB 12.9 08/22/2012   HCT 40.7 08/22/2012   PLT 325 08/22/2012   GLUCOSE 102* 08/22/2012   CHOL 198 03/21/2012   TRIG 104 03/21/2012   HDL 53 03/21/2012   LDLDIRECT 111* 02/27/2010   LDLCALC 124* 03/21/2012   ALKPHOS 113 08/01/2012   ALT 11 08/01/2012   AST 15 08/01/2012   NA 144 08/22/2012   K 3.4* 08/22/2012   CL 107 08/22/2012   CREATININE 0.7 08/22/2012   BUN 8.1 08/22/2012   CO2 28 08/22/2012   INR 1.10 10/19/2011    ASSESSMENT AND PLAN:   1. Recurrent Lung cancer now with brain met:  - s/p brain radiation.  - she has on maintenance Alimta since 02/2012.  She is tolerating it well with grade 1 anorexia.  She otherwise does not have dose-limiting toxicity from chemo.  I recommended her to continue with Alimta with every 4-5 months restaging CT.  We will readdress in Sep 2014 as to when to  stop maintenance Alimta.   2.  Back pain with questionable uptake on bone scan but negative on MRI did not show bone met.  This is due to DJD.  She follows up with her PCP and has Tramadol and Zanaflex prn.   3. History of colon cancer: She has no evidence of recurrent or metastatic disease for her colon cancer. Surveillance for her history of colon cancer entails once-a-year CT scan for 5 years' total.  She had a surveillance colonoscopy in October 2012 by Dr Christella Hartigan, which showed polyps. Per his recommendation, the next colonoscopy is due in Oct 2015 but sooner if she has symptoms.   4. Hypertension: She is on Bystolic, Catapres, HCTZ and per PCP.   5. Hypercholesterolemia: She is on pravastatin per PCP.   6. Anorexia:  Weight is stable. On Remeron. She is followed by Vernell Leep as needed.   7. Follow up: in about 3 weeks.   8. CODE STATUS:  FULL CODE per previous discussion.      The length of time of the face-to-face encounter was 15 minutes. More than 50% of time was spent counseling and coordination of care.

## 2012-08-23 ENCOUNTER — Telehealth: Payer: Self-pay | Admitting: Family Medicine

## 2012-08-23 MED ORDER — OMEPRAZOLE 20 MG PO CPDR: 20.0000 mg | DELAYED_RELEASE_CAPSULE | Freq: Every day | ORAL | Status: DC

## 2012-08-23 NOTE — Telephone Encounter (Signed)
Is asking for Dr Lula Olszewski to send a script in for Omeprazole to [facility] - for her acid reflux

## 2012-08-23 NOTE — Telephone Encounter (Signed)
omeprazole sent in.  Patient notified.

## 2012-09-02 ENCOUNTER — Encounter: Payer: Self-pay | Admitting: Radiation Oncology

## 2012-09-02 ENCOUNTER — Ambulatory Visit
Admission: RE | Admit: 2012-09-02 | Discharge: 2012-09-02 | Disposition: A | Discharge status: Home / Self Care | Payer: Medicare Other | Source: Ambulatory Visit | Attending: Radiation Oncology | Admitting: Radiation Oncology

## 2012-09-02 ENCOUNTER — Ambulatory Visit (HOSPITAL_BASED_OUTPATIENT_CLINIC_OR_DEPARTMENT_OTHER): Payer: Medicare Other

## 2012-09-02 VITALS — BP 126/73 | HR 83 | Temp 97.0°F

## 2012-09-02 DIAGNOSIS — C3410 Malignant neoplasm of upper lobe, unspecified bronchus or lung: Secondary | ICD-10-CM

## 2012-09-02 DIAGNOSIS — C7949 Secondary malignant neoplasm of other parts of nervous system: Secondary | ICD-10-CM

## 2012-09-02 DIAGNOSIS — C3490 Malignant neoplasm of unspecified part of unspecified bronchus or lung: Secondary | ICD-10-CM

## 2012-09-02 DIAGNOSIS — Z452 Encounter for adjustment and management of vascular access device: Secondary | ICD-10-CM

## 2012-09-02 MED ORDER — HEPARIN SOD (PORK) LOCK FLUSH 100 UNIT/ML IV SOLN
500.0000 [IU] | Freq: Once | INTRAVENOUS | Status: AC
  Administered 2012-09-02: 500 [IU] via INTRAVENOUS
  Filled 2012-09-02: qty 5

## 2012-09-02 MED ORDER — GADOBENATE DIMEGLUMINE 529 MG/ML IV SOLN
15.0000 mL | Freq: Once | INTRAVENOUS | Status: AC | PRN
  Administered 2012-09-02: 15 mL via INTRAVENOUS

## 2012-09-02 MED ORDER — SODIUM CHLORIDE 0.9 % IJ SOLN
10.0000 mL | INTRAMUSCULAR | Status: DC | PRN
  Administered 2012-09-02: 10 mL via INTRAVENOUS
  Filled 2012-09-02: qty 10

## 2012-09-02 NOTE — Patient Instructions (Signed)
Power needle left in place for MRI use.  Line capped and heparin locked.

## 2012-09-05 ENCOUNTER — Encounter: Payer: Self-pay | Admitting: Radiation Oncology

## 2012-09-05 ENCOUNTER — Encounter: Payer: Self-pay | Admitting: Oncology

## 2012-09-05 ENCOUNTER — Ambulatory Visit
Admission: RE | Admit: 2012-09-05 | Discharge: 2012-09-05 | Disposition: A | Discharge status: Home / Self Care | Payer: Medicare Other | Source: Ambulatory Visit | Attending: Radiation Oncology | Admitting: Radiation Oncology

## 2012-09-05 VITALS — BP 125/65 | HR 95 | Temp 98.7°F | Resp 20 | Wt 157.2 lb

## 2012-09-05 DIAGNOSIS — C7931 Secondary malignant neoplasm of brain: Secondary | ICD-10-CM

## 2012-09-05 NOTE — Progress Notes (Signed)
Follow up Lung cancer/brain mets  SRS left occipital  03/01/12 Alert,oriented x3, , steady gait,no c/o head ache, nausea sob, appetite  Fair, eating up to 2 meals a day now, fatigue poor, no c/o pain right now, the zanaflex new meds is helping her back pain states patient,  MRI brain 09/02/12 results in 8:02 AM

## 2012-09-05 NOTE — Progress Notes (Signed)
Radiation Oncology         [phone] ________________________________  Name: Lauren [name] MRN: [id number]  Date: 09/05/2012  DOB: [birth year]  Follow-Up Visit Note  Outpatient  CC: Ardyth Gal, MD  Ardyth Gal, MD  Diagnosis:   Metastatic non-small cell lung cancer, adenocarcinoma, with 2 brain metastases. Left occipital and right insular metastases   Interval Since Last Radiation: She completed 20 Gray in 1 fraction to both of the metastases on 03/01/2012  Narrative:  The patient returns today for routine follow-up.  Denies HA, nausea, SOB.  Steady ambulation, appetite fair.  She is fatigued. No pain.      CT of chest in January showed faint opacities in RUL  C/w low grade adenocarcinoma vs infectious process. No adenopathy.      No obvious metastatic disease in abdomen.  L spine MRI negative for mets.        Brain MRI on 09-02-12 - showed continued involution of the treated left occipital metastasis. Measurements today are 3 x 5 mm as compared with 4 x 8 mm in January. No new lesions are identified.  Right insular met no longer visible.  She continues chemotherapy.                 ALLERGIES:  is allergic to lisinopril.  Meds: Current Outpatient Prescriptions  Medication Sig Dispense Refill  . albuterol (PROVENTIL HFA;VENTOLIN HFA) 108 (90 BASE) MCG/ACT inhaler Inhale 2 puffs into the lungs every 6 (six) hours as needed. For shortness of breath  2 Inhaler  11  . aspirin 81 MG EC tablet Take 81 mg by mouth every morning.       Marland Kitchen BYSTOLIC 10 MG tablet TAKE 1 TABLET BY MOUTH ONCE DAILY  30 tablet  5  . cloNIDine (CATAPRES) 0.1 MG tablet TAKE 1 TABLET BY MOUTH ONCE DAILY AT BEDTIME.  60 tablet  5  . diclofenac sodium (VOLTAREN) 1 % GEL Apply 2 g topically 4 (four) times daily. As needed for pain  100 g  11  . folic acid (FOLVITE) 1 MG tablet TAKE 1 TABLET BY MOUTH ONCE DAILY  30 tablet  1  . hydrochlorothiazide (HYDRODIURIL) 25 MG tablet TAKE 1 TABLET BY MOUTH  ONCE DAILY  30 tablet  11  . omeprazole (PRILOSEC) 20 MG capsule Take 1 capsule (20 mg total) by mouth daily.  30 capsule  1  . oxyCODONE-acetaminophen (PERCOCET/ROXICET) 5-325 MG per tablet Take 1-2 tablets by mouth every 6 (six) hours as needed for pain.  90 tablet  0  . potassium chloride SA (KLOR-CON M20) 20 MEQ tablet Take 1 tablet (20 mEq total) by mouth daily.  90 tablet  3  . pravastatin (PRAVACHOL) 40 MG tablet TAKE 2 TABLETS BY MOUTH AT BEDTIME  60 tablet  5  . tiZANidine (ZANAFLEX) 2 MG tablet Take 1 tablet (2 mg total) by mouth every 6 (six) hours as needed (Back Muscle Spasm).  30 tablet  3  . traMADol (ULTRAM) 50 MG tablet Take 1 tablet (50 mg total) by mouth every 6 (six) hours as needed. As needed for pain.  90 tablet  11  . lidocaine-prilocaine (EMLA) cream Apply topically as needed. Apply to porta cath site one hour prior to needle stick.  30 g  2  . mirtazapine (REMERON) 15 MG tablet Take 1 tablet (15 mg total) by mouth at bedtime.  30 tablet  1  . nitroGLYCERIN (NITROSTAT) 0.4 MG SL tablet Place 0.4 mg under the tongue every  5 (five) minutes as needed. For chest pain      . [DISCONTINUED] buPROPion (WELLBUTRIN XL) 150 MG 24 hr tablet Take 1 tablet (150 mg total) by mouth 2 (two) times daily.  60 tablet  5   No current facility-administered medications for this encounter.    Physical Findings: The patient is in no acute distress. Patient is alert and oriented.  weight is 157 lb 3.2 oz (71.305 kg). Her oral temperature is 98.7 F (37.1 C). Her blood pressure is 125/65 and her pulse is 95. Her respiration is 20 and oxygen saturation is 98%. .  No significant changes. General: Alert and oriented, in no acute distress HEENT: Head is normocephalic. Pupils are equally round. Extraocular movements are intact. Oropharynx is clear. Neck: Neck is supple, no palpable cervical or supraclavicular lymphadenopathy. Heart: Regular in rate and rhythm with no murmurs, rubs, or gallops. Chest:  Clear to auscultation bilaterally, with no rhonchi, wheezes, or rales. Lymphatics: No concerning lymphadenopathy. Skin: No concerning lesions. Musculoskeletal: symmetric strength and muscle tone throughout. Neurologic: Cranial nerves II through XII are grossly intact. No obvious focalities. Speech is fluent. Coordination is intact. Visual quadrants intact. Psychiatric: Judgment and insight are intact. Affect is appropriate.    Lab Findings: Lab Results  Component Value Date   WBC 4.5 08/22/2012   HGB 12.9 08/22/2012   HCT 40.7 08/22/2012   MCV 78.0* 08/22/2012   PLT 325 08/22/2012     Radiographic Findings: Mr Laqueta Jean ZO Contrast  09/02/2012  *RADIOLOGY REPORT*  Clinical Data: Restaging, S R S 83-month follow-up.  Presumed brain mets from non-small cell lung cancer, underwent stereotactic radiosurgery for left occipital metastasis, completed 03/01/2012.  MRI HEAD WITHOUT AND WITH CONTRAST  Technique:  Multiplanar, multiecho pulse sequences of the brain and surrounding structures were obtained according to standard protocol without and with intravenous contrast  Contrast: 15mL MULTIHANCE GADOBENATE DIMEGLUMINE 529 MG/ML IV SOLN  Comparison: Most recent 05/27/2012.  Findings: Continued involution of the treated left occipital metastasis.  Measurements today are 3 x 5 mm as compared with 4 x 8 mm in January.  No new lesions are identified.  Mild premature atrophy.  Slight prominence perivascular spaces. Mild chronic microvascular ischemic change.  Major flow voids are preserved.  No osseous lesions.  Mild pannus.  IMPRESSION: Continued improvement status post left occipital metastasis S R S. No recurrence or worrisome features of radiation necrosis.  No new lesions.   Original Report Authenticated By: Davonna Belling, M.D.     Impression/Plan:  Doing well. No sign of progression or recurrence in Brain. F/u in 3 mo with repeat MRI of brain at that time. She was encouraged to call if questions prior to  then.  I spent 20 minutes minutes face to face with the patient and more than 50% of that time was spent in counseling and/or coordination of care. _____________________________________   Lonie Peak, MD

## 2012-09-05 NOTE — Patient Instructions (Addendum)
1.  Diagnosis:  Metastatic lung cancer. 2.  Treatment:  Continue with chemotherapy Alimta.   3.  Next restaging:  May 2014 to ascertain disease stability.  If stable disease, continue Alimta.  If disease progresses, consider salvage chemo such as oral chemo Tarceva or IV Taxotere.

## 2012-09-08 ENCOUNTER — Ambulatory Visit (HOSPITAL_BASED_OUTPATIENT_CLINIC_OR_DEPARTMENT_OTHER): Payer: Medicare Other | Admitting: Oncology

## 2012-09-08 ENCOUNTER — Telehealth: Payer: Self-pay | Admitting: Oncology

## 2012-09-08 ENCOUNTER — Telehealth: Payer: Self-pay | Admitting: *Deleted

## 2012-09-08 ENCOUNTER — Other Ambulatory Visit (HOSPITAL_BASED_OUTPATIENT_CLINIC_OR_DEPARTMENT_OTHER): Payer: Medicare Other | Admitting: Lab

## 2012-09-08 DIAGNOSIS — C3410 Malignant neoplasm of upper lobe, unspecified bronchus or lung: Secondary | ICD-10-CM

## 2012-09-08 DIAGNOSIS — I10 Essential (primary) hypertension: Secondary | ICD-10-CM

## 2012-09-08 DIAGNOSIS — C7949 Secondary malignant neoplasm of other parts of nervous system: Secondary | ICD-10-CM

## 2012-09-08 DIAGNOSIS — C7931 Secondary malignant neoplasm of brain: Secondary | ICD-10-CM

## 2012-09-08 DIAGNOSIS — Z85038 Personal history of other malignant neoplasm of large intestine: Secondary | ICD-10-CM

## 2012-09-08 LAB — CBC WITH DIFFERENTIAL/PLATELET
BASO%: 0.8 % (ref 0.0–2.0)
EOS%: 1.1 % (ref 0.0–7.0)
HGB: 11.3 g/dL — ABNORMAL LOW (ref 11.6–15.9)
MCH: 24.4 pg — ABNORMAL LOW (ref 25.1–34.0)
MCHC: 32.1 g/dL (ref 31.5–36.0)
MCV: 75.9 fL — ABNORMAL LOW (ref 79.5–101.0)
MONO%: 13.4 % (ref 0.0–14.0)
RBC: 4.65 10*6/uL (ref 3.70–5.45)
RDW: 19.1 % — ABNORMAL HIGH (ref 11.2–14.5)
lymph#: 0.7 10*3/uL — ABNORMAL LOW (ref 0.9–3.3)

## 2012-09-08 LAB — COMPREHENSIVE METABOLIC PANEL (CC13)
BUN: 8 mg/dL (ref 7.0–26.0)
CO2: 27 mEq/L (ref 22–29)
Creatinine: 0.8 mg/dL (ref 0.6–1.1)
Glucose: 104 mg/dl — ABNORMAL HIGH (ref 70–99)
Sodium: 142 mEq/L (ref 136–145)
Total Bilirubin: 0.4 mg/dL (ref 0.20–1.20)
Total Protein: 6.4 g/dL (ref 6.4–8.3)

## 2012-09-08 NOTE — Telephone Encounter (Signed)
Per staff message and POF I have scheduled appts.  JMW  

## 2012-09-08 NOTE — Telephone Encounter (Signed)
gv and printed appt sched and avs for pt....michelle added tx...  °

## 2012-09-08 NOTE — Progress Notes (Signed)
[facility]  Telephone:([phone] Fax:(336) [phone]   OFFICE PROGRESS NOTE   Cc:  CHAMBERLAIN,RACHEL, MD  PAST DIAGNOSIS AND PAST THERAPY:  1. 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 Her post resection surveillance colonoscopy onOctober 14, 2009, by Dr. Christella Hartigan was negative. 2. pT1a, pN0 well differentiated adenocarcinoma of lung; s/p left upper lobectomy on 06/16/2010. 3. 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  4. 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  She underwent stereotactic radiosurgery.  The left occipital 15mm target was treated using 4 Dynamic Conformal Arcs to a prescription dose of 20 Gy in 1 fraction. 6 MV photons, flattening filter free, were used. The right insula 4mm target was treated using 3 Circular Arcs to a prescription dose of 20 Gy in 1 fraction.  6 MV photons, flattening filter free, were used  CURRENT THERAPY: started maintenance chemo Alimta q3 weeks on 03/07/2012.    INTERVAL HISTORY: Lauren [name] 62 y.o. female returns for regular follow up by herself.  Her recent restaging brain MRI did not show sign of residual or new brain met. She had a busy weekend.  Her son came to visit.  She has been helping her daughter out since her daughter recently had surgery.  Her appetite is not good; but it is stable.  She does not think that she is losing much weight. She denied fever, headache, confusion, visual changes, SOB, cough, chest pain, abd pain, bleeding, skin rash, neuropathy, lower back pain, incontinence. The rest of the 14-point review of system was negative.      Past Medical History  Diagnosis Date  . Depression   .  Hyperlipidemia   . Hypertension   . Lung nodule     FNA ordered for 04/02/10 by HA>pos Ca  . Chronic folliculitis     of groin  . Fibrocystic breast changes   . Family history of trichomonal vaginitis 05/2005  . Postmenopausal   . Depressive disorder   . Hypercholesterolemia   . GERD (gastroesophageal reflux disease)   . Cerebral aneurysm   . Back pain   . Shortness of breath   . COPD (chronic obstructive pulmonary disease)   . Coronary artery disease   . Arthritis   . Weight loss 10/22/2011  . Status post radiation therapy 11/16/11 - 12/29/11    Right Lung and Mediastinum: 60 Gy  . Status post chemotherapy comp. 12/29/11    Carboplatin/Taxol  . Colon cancer 11/08  . Lung cancer 06/16/10    PET scan 04/28/2010; primary: increase in size 02/2010 / Well Differentiated Adenocarcinoma of the lung   . Brain metastases 02/15/12  . S/P radiation therapy 03/01/12    SRS: 1 fraction / 20 Gray each to the Left Occipital Region and to the Right Insular Metastases  . On antineoplastic chemotherapy started 02/2012    Alimta    Past Surgical History  Procedure Laterality Date  . Colectomy  03/22/07    Stage 1 pT2 N0, M0 Adenocarcinoma of the sigmoid  colon  . Tubal ligation    . Lung lobectomy  06/16/10    Left Upper Lobectomy  . Hernia repair    . Breast surgery      Bil lumpectomy  .  Back surgery    . Mediastinoscopy  10/19/2011    Procedure: MEDIASTINOSCOPY;  Surgeon: Alleen Borne, MD;  Location: [facility];  Service: Thoracic;  Laterality: N/A;  . Cardiac cath x3      Current Outpatient Prescriptions  Medication Sig Dispense Refill  . albuterol (PROVENTIL HFA;VENTOLIN HFA) 108 (90 BASE) MCG/ACT inhaler Inhale 2 puffs into the lungs every 6 (six) hours as needed. For shortness of breath  2 Inhaler  11  . aspirin 81 MG EC tablet Take 81 mg by mouth every morning.       Marland Kitchen BYSTOLIC 10 MG tablet TAKE 1 TABLET BY MOUTH ONCE DAILY  30 tablet  5  . cloNIDine (CATAPRES) 0.1 MG tablet TAKE 1 TABLET  BY MOUTH ONCE DAILY AT BEDTIME.  60 tablet  5  . diclofenac sodium (VOLTAREN) 1 % GEL Apply 2 g topically 4 (four) times daily. As needed for pain  100 g  11  . folic acid (FOLVITE) 1 MG tablet TAKE 1 TABLET BY MOUTH ONCE DAILY  30 tablet  1  . hydrochlorothiazide (HYDRODIURIL) 25 MG tablet TAKE 1 TABLET BY MOUTH ONCE DAILY  30 tablet  11  . lidocaine-prilocaine (EMLA) cream Apply topically as needed. Apply to porta cath site one hour prior to needle stick.  30 g  2  . mirtazapine (REMERON) 15 MG tablet Take 1 tablet (15 mg total) by mouth at bedtime.  30 tablet  1  . nitroGLYCERIN (NITROSTAT) 0.4 MG SL tablet Place 0.4 mg under the tongue every 5 (five) minutes as needed. For chest pain      . omeprazole (PRILOSEC) 20 MG capsule Take 1 capsule (20 mg total) by mouth daily.  30 capsule  1  . oxyCODONE-acetaminophen (PERCOCET/ROXICET) 5-325 MG per tablet Take 1-2 tablets by mouth every 6 (six) hours as needed for pain.  90 tablet  0  . potassium chloride SA (KLOR-CON M20) 20 MEQ tablet Take 1 tablet (20 mEq total) by mouth daily.  90 tablet  3  . pravastatin (PRAVACHOL) 40 MG tablet TAKE 2 TABLETS BY MOUTH AT BEDTIME  60 tablet  5  . tiZANidine (ZANAFLEX) 2 MG tablet Take 1 tablet (2 mg total) by mouth every 6 (six) hours as needed (Back Muscle Spasm).  30 tablet  3  . traMADol (ULTRAM) 50 MG tablet Take 1 tablet (50 mg total) by mouth every 6 (six) hours as needed. As needed for pain.  90 tablet  11  . [DISCONTINUED] buPROPion (WELLBUTRIN XL) 150 MG 24 hr tablet Take 1 tablet (150 mg total) by mouth 2 (two) times daily.  60 tablet  5   No current facility-administered medications for this visit.    ALLERGIES:  is allergic to lisinopril.  REVIEW OF SYSTEMS:  The rest of the 14-point review of system was negative.   Filed Vitals:   09/08/12 0955  BP: 119/70  Pulse: 88  Temp: 96.7 F (35.9 C)  Resp: 18   Wt Readings from Last 3 Encounters:  09/08/12 155 lb 11.2 oz (70.625 kg)  09/05/12  157 lb 3.2 oz (71.305 kg)  08/22/12 155 lb 6.4 oz (70.489 kg)   ECOG Performance status: 1  PHYSICAL EXAMINATION:   General:  Thin-appearing woman, in no acute distress.  Eyes:  no scleral icterus.  ENT:  There were no oropharyngeal lesions.  Neck was without thyromegaly.  Lymphatics:  Negative cervical, supraclavicular or axillary adenopathy.  Respiratory: lungs were clear bilaterally without wheezing or crackles.  Cardiovascular:  Regular rate and rhythm, S1/S2, without murmur, rub or gallop.  There was no pedal edema.  GI:  abdomen was soft, flat, nontender, nondistended, without organomegaly.  Muscoloskeletal:  no spinal tenderness of palpation of vertebral spine.  Skin exam was without echymosis, petichae.  Neuro exam was nonfocal.Patient was able to get on and off exam table without assistance.  Gait was normal.  Patient was alerted and oriented.  Attention was good.   Language was appropriate.  Mood was normal without depression.  Speech was not pressured.  Thought content was not tangential.     LABORATORY/RADIOLOGY DATA:  Lab Results  Component Value Date   WBC 5.4 09/08/2012   HGB 11.3* 09/08/2012   HCT 35.3 09/08/2012   PLT 244 09/08/2012   GLUCOSE 104* 09/08/2012   CHOL 198 03/21/2012   TRIG 104 03/21/2012   HDL 53 03/21/2012   LDLDIRECT 111* 02/27/2010   LDLCALC 124* 03/21/2012   ALKPHOS 117 09/08/2012   ALT 16 09/08/2012   AST 14 09/08/2012   NA 142 09/08/2012   K 3.5 09/08/2012   CL 104 09/08/2012   CREATININE 0.8 09/08/2012   BUN 8.0 09/08/2012   CO2 27 09/08/2012   INR 1.10 10/19/2011    ASSESSMENT AND PLAN:   1. Recurrent Lung cancer now with brain med:  - Doing well on maintenance Alimta without grade 1 fatigue.  There is no dose limiting toxicity. I will restage with CT chest in May 2014.  I recommend to continue maintenance Alimta unless there is definitive evidence of disease progression.   2.  Back pain with questionable uptake on bone scan but negative on MRI did not show  bone met.  This is due to DJD.  She follows up with her PCP and has Tramadol prn.   3. History of colon cancer: She has no evidence of recurrent or metastatic disease for her colon cancer. Surveillance for her history of colon cancer entails once-a-year CT scan for 5 years' total.  She had a surveillance colonoscopy in October 2012 by Dr Christella Hartigan, which showed polyps. Per his recommendation, the next colonoscopy is due in Oct 2015 but sooner if she has symptoms.   4. Hypertension: She is on Bystolic, Catapres, HCTZ and per PCP.   5. Hypercholesterolemia: She is on pravastatin per PCP.   6. Follow up: in about 3 weeks.   7. CODE STATUS:  FULL CODE per previous discussion.   I informed her that I'm leaving the practice in July 2014.  [facility] to have a new provider.    The length of time of the face-to-face encounter was 15 minutes. More than 50% of time was spent counseling and coordination of care.       Huan T. Gaylyn Rong, M.D.

## 2012-09-12 ENCOUNTER — Ambulatory Visit (HOSPITAL_BASED_OUTPATIENT_CLINIC_OR_DEPARTMENT_OTHER): Payer: Medicare Other

## 2012-09-12 ENCOUNTER — Other Ambulatory Visit: Payer: Self-pay | Admitting: Lab

## 2012-09-12 ENCOUNTER — Ambulatory Visit: Payer: Self-pay | Admitting: Oncology

## 2012-09-12 VITALS — BP 140/90 | HR 87 | Temp 98.3°F

## 2012-09-12 DIAGNOSIS — Z5111 Encounter for antineoplastic chemotherapy: Secondary | ICD-10-CM

## 2012-09-12 DIAGNOSIS — C7949 Secondary malignant neoplasm of other parts of nervous system: Secondary | ICD-10-CM

## 2012-09-12 DIAGNOSIS — C7931 Secondary malignant neoplasm of brain: Secondary | ICD-10-CM

## 2012-09-12 DIAGNOSIS — C3410 Malignant neoplasm of upper lobe, unspecified bronchus or lung: Secondary | ICD-10-CM

## 2012-09-12 DIAGNOSIS — C3490 Malignant neoplasm of unspecified part of unspecified bronchus or lung: Secondary | ICD-10-CM

## 2012-09-12 MED ORDER — HEPARIN SOD (PORK) LOCK FLUSH 100 UNIT/ML IV SOLN
500.0000 [IU] | Freq: Once | INTRAVENOUS | Status: AC | PRN
  Administered 2012-09-12: 500 [IU]
  Filled 2012-09-12: qty 5

## 2012-09-12 MED ORDER — ONDANSETRON 8 MG/50ML IVPB (CHCC)
8.0000 mg | Freq: Once | INTRAVENOUS | Status: AC
  Administered 2012-09-12: 8 mg via INTRAVENOUS

## 2012-09-12 MED ORDER — CYANOCOBALAMIN 1000 MCG/ML IJ SOLN
1000.0000 ug | INTRAMUSCULAR | Status: DC
  Administered 2012-09-12: 1000 ug via INTRAMUSCULAR

## 2012-09-12 MED ORDER — SODIUM CHLORIDE 0.9 % IV SOLN
500.0000 mg/m2 | Freq: Once | INTRAVENOUS | Status: AC
  Administered 2012-09-12: 925 mg via INTRAVENOUS
  Filled 2012-09-12: qty 37

## 2012-09-12 MED ORDER — DEXAMETHASONE SODIUM PHOSPHATE 10 MG/ML IJ SOLN
10.0000 mg | Freq: Once | INTRAMUSCULAR | Status: AC
  Administered 2012-09-12: 10 mg via INTRAVENOUS

## 2012-09-12 MED ORDER — SODIUM CHLORIDE 0.9 % IJ SOLN
10.0000 mL | INTRAMUSCULAR | Status: DC | PRN
  Administered 2012-09-12: 10 mL
  Filled 2012-09-12: qty 10

## 2012-09-12 MED ORDER — SODIUM CHLORIDE 0.9 % IV SOLN
Freq: Once | INTRAVENOUS | Status: AC
  Administered 2012-09-12: 13:00:00 via INTRAVENOUS

## 2012-09-12 NOTE — Patient Instructions (Signed)
[facility] Discharge Instructions for Patients Receiving Chemotherapy  Today you received the following chemotherapy agents: alimta  To help prevent nausea and vomiting after your treatment, we encourage you to take your nausea medication.  Take it as often as prescribed.     If you develop nausea and vomiting that is not controlled by your nausea medication, call the clinic. If it is after clinic hours your family physician or the after hours number for the clinic or go to the Emergency Department.   BELOW ARE SYMPTOMS THAT SHOULD BE REPORTED IMMEDIATELY:  *FEVER GREATER THAN 100.5 F  *CHILLS WITH OR WITHOUT FEVER  NAUSEA AND VOMITING THAT IS NOT CONTROLLED WITH YOUR NAUSEA MEDICATION  *UNUSUAL SHORTNESS OF BREATH  *UNUSUAL BRUISING OR BLEEDING  TENDERNESS IN MOUTH AND THROAT WITH OR WITHOUT PRESENCE OF ULCERS  *URINARY PROBLEMS  *BOWEL PROBLEMS  UNUSUAL RASH Items with * indicate a potential emergency and should be followed up as soon as possible.  Feel free to call the clinic you have any questions or concerns. The clinic phone number is [phone].   I have been informed and understand all the instructions given to me. I know to contact the clinic, my physician, or go to the Emergency Department if any problems should occur. I do not have any questions at this time, but understand that I may call the clinic during office hours   should I have any questions or need assistance in obtaining follow up care.    __________________________________________  _____________  __________ Signature of Patient or Authorized Representative            Date                   Time    __________________________________________ Nurse's Signature    

## 2012-09-14 ENCOUNTER — Other Ambulatory Visit: Payer: Self-pay | Admitting: Radiation Therapy

## 2012-09-14 DIAGNOSIS — C7949 Secondary malignant neoplasm of other parts of nervous system: Secondary | ICD-10-CM

## 2012-09-20 ENCOUNTER — Telehealth: Payer: Self-pay | Admitting: *Deleted

## 2012-09-20 NOTE — Telephone Encounter (Signed)
CALLED PATIENT TO INFORM OF TEST, SPOKE WITH PATIENT AND SHE IS AWARE OF THIS TEST

## 2012-09-28 ENCOUNTER — Telehealth: Payer: Self-pay | Admitting: *Deleted

## 2012-09-28 NOTE — Telephone Encounter (Signed)
Pt called to get instructions about her CT tomorrow.  Called CT and they instruct to be NPO for 2 hrs prior to test and they will use her PAC for IV dye.  Instructed pt and she verbalized understanding.

## 2012-09-29 ENCOUNTER — Ambulatory Visit (HOSPITAL_COMMUNITY)
Admission: RE | Admit: 2012-09-29 | Discharge: 2012-09-29 | Disposition: A | Discharge status: Home / Self Care | Payer: Medicare Other | Source: Ambulatory Visit | Attending: Oncology | Admitting: Oncology

## 2012-09-29 ENCOUNTER — Other Ambulatory Visit (HOSPITAL_COMMUNITY): Payer: Self-pay

## 2012-09-29 ENCOUNTER — Other Ambulatory Visit: Payer: Self-pay | Admitting: Radiation Therapy

## 2012-09-29 DIAGNOSIS — R0602 Shortness of breath: Secondary | ICD-10-CM | POA: Insufficient documentation

## 2012-09-29 DIAGNOSIS — I2510 Atherosclerotic heart disease of native coronary artery without angina pectoris: Secondary | ICD-10-CM | POA: Insufficient documentation

## 2012-09-29 DIAGNOSIS — C7949 Secondary malignant neoplasm of other parts of nervous system: Secondary | ICD-10-CM

## 2012-09-29 DIAGNOSIS — R9389 Abnormal findings on diagnostic imaging of other specified body structures: Secondary | ICD-10-CM | POA: Insufficient documentation

## 2012-09-29 DIAGNOSIS — N289 Disorder of kidney and ureter, unspecified: Secondary | ICD-10-CM | POA: Insufficient documentation

## 2012-09-29 DIAGNOSIS — C3490 Malignant neoplasm of unspecified part of unspecified bronchus or lung: Secondary | ICD-10-CM | POA: Insufficient documentation

## 2012-09-29 DIAGNOSIS — Z79899 Other long term (current) drug therapy: Secondary | ICD-10-CM | POA: Insufficient documentation

## 2012-09-29 DIAGNOSIS — Z923 Personal history of irradiation: Secondary | ICD-10-CM | POA: Insufficient documentation

## 2012-09-29 DIAGNOSIS — J90 Pleural effusion, not elsewhere classified: Secondary | ICD-10-CM | POA: Insufficient documentation

## 2012-09-29 MED ORDER — IOHEXOL 300 MG/ML  SOLN
80.0000 mL | Freq: Once | INTRAMUSCULAR | Status: AC | PRN
  Administered 2012-09-29: 80 mL via INTRAVENOUS

## 2012-10-03 ENCOUNTER — Telehealth: Payer: Self-pay | Admitting: *Deleted

## 2012-10-03 ENCOUNTER — Other Ambulatory Visit (HOSPITAL_BASED_OUTPATIENT_CLINIC_OR_DEPARTMENT_OTHER): Payer: Medicare Other

## 2012-10-03 ENCOUNTER — Ambulatory Visit (HOSPITAL_BASED_OUTPATIENT_CLINIC_OR_DEPARTMENT_OTHER): Payer: Medicare Other

## 2012-10-03 ENCOUNTER — Encounter: Payer: Self-pay | Admitting: Oncology

## 2012-10-03 ENCOUNTER — Ambulatory Visit (HOSPITAL_BASED_OUTPATIENT_CLINIC_OR_DEPARTMENT_OTHER): Payer: Medicare Other | Admitting: Oncology

## 2012-10-03 DIAGNOSIS — J189 Pneumonia, unspecified organism: Secondary | ICD-10-CM

## 2012-10-03 DIAGNOSIS — C7949 Secondary malignant neoplasm of other parts of nervous system: Secondary | ICD-10-CM

## 2012-10-03 DIAGNOSIS — C3410 Malignant neoplasm of upper lobe, unspecified bronchus or lung: Secondary | ICD-10-CM

## 2012-10-03 DIAGNOSIS — C7931 Secondary malignant neoplasm of brain: Secondary | ICD-10-CM

## 2012-10-03 DIAGNOSIS — C3490 Malignant neoplasm of unspecified part of unspecified bronchus or lung: Secondary | ICD-10-CM

## 2012-10-03 DIAGNOSIS — Z5111 Encounter for antineoplastic chemotherapy: Secondary | ICD-10-CM

## 2012-10-03 DIAGNOSIS — J90 Pleural effusion, not elsewhere classified: Secondary | ICD-10-CM

## 2012-10-03 LAB — COMPREHENSIVE METABOLIC PANEL (CC13)
ALT: 19 U/L (ref 0–55)
AST: 16 U/L (ref 5–34)
Albumin: 2.8 g/dL — ABNORMAL LOW (ref 3.5–5.0)
Alkaline Phosphatase: 128 U/L (ref 40–150)
BUN: 7.6 mg/dL (ref 7.0–26.0)
Calcium: 9 mg/dL (ref 8.4–10.4)
Chloride: 105 mEq/L (ref 98–107)
Potassium: 3.7 mEq/L (ref 3.5–5.1)
Sodium: 140 mEq/L (ref 136–145)
Total Protein: 6.5 g/dL (ref 6.4–8.3)

## 2012-10-03 LAB — CBC WITH DIFFERENTIAL/PLATELET
BASO%: 0.4 % (ref 0.0–2.0)
Basophils Absolute: 0 10*3/uL (ref 0.0–0.1)
EOS%: 1.5 % (ref 0.0–7.0)
HGB: 12 g/dL (ref 11.6–15.9)
MCH: 24.8 pg — ABNORMAL LOW (ref 25.1–34.0)
NEUT#: 3.2 10*3/uL (ref 1.5–6.5)
RBC: 4.83 10*6/uL (ref 3.70–5.45)
RDW: 19.5 % — ABNORMAL HIGH (ref 11.2–14.5)
lymph#: 0.8 10*3/uL — ABNORMAL LOW (ref 0.9–3.3)

## 2012-10-03 MED ORDER — SODIUM CHLORIDE 0.9 % IV SOLN
Freq: Once | INTRAVENOUS | Status: AC
  Administered 2012-10-03: 13:00:00 via INTRAVENOUS

## 2012-10-03 MED ORDER — PREDNISONE 10 MG PO TABS: 60.0000 mg | ORAL_TABLET | Freq: Every day | ORAL | Status: DC

## 2012-10-03 MED ORDER — DEXAMETHASONE SODIUM PHOSPHATE 10 MG/ML IJ SOLN
10.0000 mg | Freq: Once | INTRAMUSCULAR | Status: AC
  Administered 2012-10-03: 10 mg via INTRAVENOUS

## 2012-10-03 MED ORDER — ONDANSETRON 8 MG/50ML IVPB (CHCC)
8.0000 mg | Freq: Once | INTRAVENOUS | Status: AC
  Administered 2012-10-03: 8 mg via INTRAVENOUS

## 2012-10-03 MED ORDER — SODIUM CHLORIDE 0.9 % IV SOLN
500.0000 mg/m2 | Freq: Once | INTRAVENOUS | Status: AC
  Administered 2012-10-03: 925 mg via INTRAVENOUS
  Filled 2012-10-03: qty 37

## 2012-10-03 MED ORDER — SODIUM CHLORIDE 0.9 % IJ SOLN
10.0000 mL | INTRAMUSCULAR | Status: DC | PRN
  Administered 2012-10-03: 10 mL
  Filled 2012-10-03: qty 10

## 2012-10-03 MED ORDER — HEPARIN SOD (PORK) LOCK FLUSH 100 UNIT/ML IV SOLN
500.0000 [IU] | Freq: Once | INTRAVENOUS | Status: AC | PRN
  Administered 2012-10-03: 500 [IU]
  Filled 2012-10-03: qty 5

## 2012-10-03 NOTE — Patient Instructions (Signed)
[facility] Discharge Instructions for Patients Receiving Chemotherapy  Today you received the following chemotherapy agents: Alimta To help prevent nausea and vomiting after your treatment, we encourage you to take your nausea medication as needed.  If you develop nausea and vomiting that is not controlled by your nausea medication, call the clinic. If it is after clinic hours your family physician or the after hours number for the clinic or go to the Emergency Department.   BELOW ARE SYMPTOMS THAT SHOULD BE REPORTED IMMEDIATELY:  *FEVER GREATER THAN 100.5 F  *CHILLS WITH OR WITHOUT FEVER  NAUSEA AND VOMITING THAT IS NOT CONTROLLED WITH YOUR NAUSEA MEDICATION  *UNUSUAL SHORTNESS OF BREATH  *UNUSUAL BRUISING OR BLEEDING  TENDERNESS IN MOUTH AND THROAT WITH OR WITHOUT PRESENCE OF ULCERS  *URINARY PROBLEMS  *BOWEL PROBLEMS  UNUSUAL RASH Items with * indicate a potential emergency and should be followed up as soon as possible.  Feel free to call the clinic you have any questions or concerns. The clinic phone number is [phone].   I have been informed and understand all the instructions given to me. I know to contact the clinic, my physician, or go to the Emergency Department if any problems should occur. I do not have any questions at this time, but understand that I may call the clinic during office hours   should I have any questions or need assistance in obtaining follow up care.

## 2012-10-03 NOTE — Telephone Encounter (Signed)
Pharmacist at [facility] pharmacy called to clarify Prednisone Rx.  States the quantity does not match the tapering instructions.

## 2012-10-03 NOTE — Telephone Encounter (Signed)
S/w pharmacist and instructed to dispense quantity sufficient for the taper as instructed by Clenton Pare.  She verbalized understanding.

## 2012-10-03 NOTE — Progress Notes (Signed)
[facility]  Telephone:(336) [phone] Fax:(336) [phone]   OFFICE PROGRESS NOTE   Cc:  CHAMBERLAIN,RACHEL, MD  PAST DIAGNOSIS AND PAST THERAPY:  1. 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 Her post resection surveillance colonoscopy onOctober 14, 2009, by Dr. Christella Hartigan was negative. 2. pT1a, pN0 well differentiated adenocarcinoma of lung; s/p left upper lobectomy on 06/16/2010. 3. 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  4. 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  She underwent stereotactic radiosurgery.  The left occipital 15mm target was treated using 4 Dynamic Conformal Arcs to a prescription dose of 20 Gy in 1 fraction. 6 MV photons, flattening filter free, were used. The right insula 4mm target was treated using 3 Circular Arcs to a prescription dose of 20 Gy in 1 fraction.  6 MV photons, flattening filter free, were used  CURRENT THERAPY: started maintenance chemo Alimta q3 weeks on 03/07/2012.    INTERVAL HISTORY: ELLE VEZINA 62 y.o. female returns for regular follow up with her daughter. Her appetite is better and weight is stable.  She denied fever, headache, confusion, visual changes, SOB, chest pain, abd pain, bleeding, skin rash, neuropathy, lower back pain, incontinence. Reports cough which is worse at night. The rest of the 14-point review of system was negative.      Past Medical History  Diagnosis Date  . Depression   . Hyperlipidemia   . Hypertension   . Lung nodule     FNA ordered for 04/02/10 by HA>pos Ca  . Chronic folliculitis     of groin  . Fibrocystic breast changes   . Family history of trichomonal vaginitis 05/2005  .  Postmenopausal   . Depressive disorder   . Hypercholesterolemia   . GERD (gastroesophageal reflux disease)   . Cerebral aneurysm   . Back pain   . Shortness of breath   . COPD (chronic obstructive pulmonary disease)   . Coronary artery disease   . Arthritis   . Weight loss 10/22/2011  . Status post radiation therapy 11/16/11 - 12/29/11    Right Lung and Mediastinum: 60 Gy  . Status post chemotherapy comp. 12/29/11    Carboplatin/Taxol  . Colon cancer 11/08  . Lung cancer 06/16/10    PET scan 04/28/2010; primary: increase in size 02/2010 / Well Differentiated Adenocarcinoma of the lung   . Brain metastases 02/15/12  . S/P radiation therapy 03/01/12    SRS: 1 fraction / 20 Gray each to the Left Occipital Region and to the Right Insular Metastases  . On antineoplastic chemotherapy started 02/2012    Alimta    Past Surgical History  Procedure Laterality Date  . Colectomy  03/22/07    Stage 1 pT2 N0, M0 Adenocarcinoma of the sigmoid  colon  . Tubal ligation    . Lung lobectomy  06/16/10    Left Upper Lobectomy  . Hernia repair    . Breast surgery      Bil lumpectomy  . Back surgery    . Mediastinoscopy  10/19/2011    Procedure: MEDIASTINOSCOPY;  Surgeon: Alleen Borne, MD;  Location: [facility] OR;  Service: Thoracic;  Laterality: N/A;  . Cardiac cath x3      Current Outpatient Prescriptions  Medication  Sig Dispense Refill  . albuterol (PROVENTIL HFA;VENTOLIN HFA) 108 (90 BASE) MCG/ACT inhaler Inhale 2 puffs into the lungs every 6 (six) hours as needed. For shortness of breath  2 Inhaler  11  . aspirin 81 MG EC tablet Take 81 mg by mouth every morning.       Marland Kitchen BYSTOLIC 10 MG tablet TAKE 1 TABLET BY MOUTH ONCE DAILY  30 tablet  5  . cloNIDine (CATAPRES) 0.1 MG tablet TAKE 1 TABLET BY MOUTH ONCE DAILY AT BEDTIME.  60 tablet  5  . diclofenac sodium (VOLTAREN) 1 % GEL Apply 2 g topically 4 (four) times daily. As needed for pain  100 g  11  . folic acid (FOLVITE) 1 MG tablet TAKE 1 TABLET BY MOUTH  ONCE DAILY  30 tablet  1  . hydrochlorothiazide (HYDRODIURIL) 25 MG tablet TAKE 1 TABLET BY MOUTH ONCE DAILY  30 tablet  11  . lidocaine-prilocaine (EMLA) cream Apply topically as needed. Apply to porta cath site one hour prior to needle stick.  30 g  2  . mirtazapine (REMERON) 15 MG tablet Take 1 tablet (15 mg total) by mouth at bedtime.  30 tablet  1  . nitroGLYCERIN (NITROSTAT) 0.4 MG SL tablet Place 0.4 mg under the tongue every 5 (five) minutes as needed. For chest pain      . omeprazole (PRILOSEC) 20 MG capsule Take 1 capsule (20 mg total) by mouth daily.  30 capsule  1  . oxyCODONE-acetaminophen (PERCOCET/ROXICET) 5-325 MG per tablet Take 1-2 tablets by mouth every 6 (six) hours as needed for pain.  90 tablet  0  . potassium chloride SA (KLOR-CON M20) 20 MEQ tablet Take 1 tablet (20 mEq total) by mouth daily.  90 tablet  3  . pravastatin (PRAVACHOL) 40 MG tablet TAKE 2 TABLETS BY MOUTH AT BEDTIME  60 tablet  5  . predniSONE (DELTASONE) 10 MG tablet Take 6 tablets (60 mg total) by mouth daily. Take 60 mg daily for 14 days, then 50 mg daily for 3 days, then 40 mg daily for 3 days, then 30 mg daily for 3 days, then 20 mg daily for 3 days, then 10  Mg daily for 3 days, then stop.  160 tablet  1  . tiZANidine (ZANAFLEX) 2 MG tablet Take 1 tablet (2 mg total) by mouth every 6 (six) hours as needed (Back Muscle Spasm).  30 tablet  3  . traMADol (ULTRAM) 50 MG tablet Take 1 tablet (50 mg total) by mouth every 6 (six) hours as needed. As needed for pain.  90 tablet  11  . [DISCONTINUED] buPROPion (WELLBUTRIN XL) 150 MG 24 hr tablet Take 1 tablet (150 mg total) by mouth 2 (two) times daily.  60 tablet  5   No current facility-administered medications for this visit.   Facility-Administered Medications Ordered in Other Visits  Medication Dose Route Frequency Provider Last Rate Last Dose  . sodium chloride 0.9 % injection 10 mL  10 mL Intracatheter PRN Exie Parody, MD   10 mL at 10/03/12 1340     ALLERGIES:  is allergic to lisinopril.  REVIEW OF SYSTEMS:  The rest of the 14-point review of system was negative.   Filed Vitals:   10/03/12 1158  BP: 124/64  Pulse: 78  Temp: 97.9 F (36.6 C)  Resp: 18   Wt Readings from Last 3 Encounters:  10/03/12 156 lb 11.2 oz (71.079 kg)  09/08/12 155 lb 11.2 oz (70.625 kg)  09/05/12 157 lb 3.2 oz (71.305 kg)   ECOG Performance status: 1  PHYSICAL EXAMINATION:   General:  Thin-appearing woman, in no acute distress.  Eyes:  no scleral icterus.  ENT:  There were no oropharyngeal lesions.  Neck was without thyromegaly.  Lymphatics:  Negative cervical, supraclavicular or axillary adenopathy.  Respiratory: lungs were clear bilaterally without wheezing or crackles.  Cardiovascular:  Regular rate and rhythm, S1/S2, without murmur, rub or gallop.  There was no pedal edema.  GI:  abdomen was soft, flat, nontender, nondistended, without organomegaly.  Muscoloskeletal:  no spinal tenderness of palpation of vertebral spine.  Skin exam was without echymosis, petichae.  Neuro exam was nonfocal.Patient was able to get on and off exam table without assistance.  Gait was normal.  Patient was alerted and oriented.  Attention was good.   Language was appropriate.  Mood was normal without depression.  Speech was not pressured.  Thought content was not tangential.     LABORATORY/RADIOLOGY DATA:  Lab Results  Component Value Date   WBC 4.7 10/03/2012   HGB 12.0 10/03/2012   HCT 37.7 10/03/2012   PLT 263 10/03/2012   GLUCOSE 98 10/03/2012   CHOL 198 03/21/2012   TRIG 104 03/21/2012   HDL 53 03/21/2012   LDLDIRECT 111* 02/27/2010   LDLCALC 124* 03/21/2012   ALKPHOS 128 10/03/2012   ALT 19 10/03/2012   AST 16 10/03/2012   NA 140 10/03/2012   K 3.7 10/03/2012   CL 105 10/03/2012   CREATININE 0.7 10/03/2012   BUN 7.6 10/03/2012   CO2 27 10/03/2012   INR 1.10 10/19/2011   IMAGING:  *RADIOLOGY REPORT*  Clinical Data: Lung and colon cancer. Chemotherapy in progress.   Radiation therapy complete. Shortness of breath.  CT CHEST WITH CONTRAST  Technique: Multidetector CT imaging of the chest was performed  following the standard protocol during bolus administration of  intravenous contrast.  Contrast: 80mL OMNIPAQUE IOHEXOL 300 MG/ML SOLN  Comparison: 05/31/2012.  Findings: A 1.7 cm low attenuation lesion is again seen in the left  lobe of the thyroid. No definite pathologically enlarged  mediastinal, hilar or axillary lymph nodes. Atherosclerotic  calcification of the arterial vasculature, including coronary  arteries. Heart size normal. No pericardial effusion.  Small right pleural effusion is new. Scattered peribronchovascular  ground-glass in the right upper lobe appears mildly progressive.  Perihilar air space disease is new. Mild emphysematous changes.  Postoperative changes of left upper lobectomy with associated  scarring and volume loss, stable. Airway is otherwise  unremarkable.  Incidental imaging of the upper abdomen shows a few scattered sub  centimeter low attenuation lesions, unchanged. A 9 mm low  attenuation lesion in the upper pole left kidney is also stable  likely a cyst. No worrisome lytic or sclerotic lesions.  Thoracotomy changes on the left.  IMPRESSION:  1. Postoperative changes of left upper lobectomy with associated  scarring and volume loss, stable.  2. Progressive scattered peribronchovascular ground-glass with new  right perihilar air space disease. Findings may be due to  pneumonia. However, the effects of radiation therapy are also  considered. Previously seen apical segment right upper lobe ground-  glass nodules are relatively obscured. Continued attention on  follow-up exams is warranted.  3. New small right pleural effusion.  Original Report Authenticated By: Leanna Battles, M.D.   ASSESSMENT AND PLAN:   1. Recurrent Lung cancer now with brain met:  - Doing well on maintenance Alimta without grade 1 fatigue.   There  is no dose limiting toxicity. I recommend to continue maintenance Alimta unless there is definitive evidence of disease progression.   2.  Back pain with questionable uptake on bone scan but negative on MRI did not show bone met.  This is due to DJD.  She follows up with her PCP and has Tramadol prn.   3. History of colon cancer: She has no evidence of recurrent or metastatic disease for her colon cancer. Surveillance for her history of colon cancer entails once-a-year CT scan for 5 years' total.  She had a surveillance colonoscopy in October 2012 by Dr Christella Hartigan, which showed polyps. Per his recommendation, the next colonoscopy is due in Oct 2015 but sooner if she has symptoms.   4. Hypertension: She is on Bystolic, Catapres, HCTZ and per PCP.   5. Hypercholesterolemia: She is on pravastatin per PCP.   6. Right pleural effusion: This is new on her CT scan. I have requested a thoracentesis within 1 week. Fluid will be sent for cytology.  7. Radiation pneumonitis: She has increased cough at night without signs of infection. I have given her Prednisone for treatment of this.  8. Follow up: in about 3 weeks.   9. CODE STATUS:  FULL CODE per previous discussion.    The length of time of the face-to-face encounter was 30 minutes. More than 50% of time was spent counseling and coordination of care.

## 2012-10-03 NOTE — Telephone Encounter (Signed)
How many tabs does she need for the taper? They can dispense that amount.

## 2012-10-05 ENCOUNTER — Other Ambulatory Visit: Payer: Self-pay | Admitting: Oncology

## 2012-10-05 ENCOUNTER — Ambulatory Visit (HOSPITAL_COMMUNITY)
Admission: RE | Admit: 2012-10-05 | Discharge: 2012-10-05 | Disposition: A | Discharge status: Home / Self Care | Payer: Medicare Other | Source: Ambulatory Visit | Attending: Oncology | Admitting: Oncology

## 2012-10-05 DIAGNOSIS — J90 Pleural effusion, not elsewhere classified: Secondary | ICD-10-CM

## 2012-10-12 ENCOUNTER — Telehealth: Payer: Self-pay | Admitting: *Deleted

## 2012-10-12 NOTE — Telephone Encounter (Signed)
Pt reports new swelling in feet/ankles especially at night. Asks if this is to be expected on the prednisone?  Per Clenton Pare, this is expected side effect of prednisone.  Pt to continue to take course and then taper as directed.  Keep feet elevated as much as possible and use support hose if needed. Pt verbalized understanding.

## 2012-10-24 ENCOUNTER — Ambulatory Visit (HOSPITAL_BASED_OUTPATIENT_CLINIC_OR_DEPARTMENT_OTHER): Payer: Medicare Other | Admitting: Oncology

## 2012-10-24 ENCOUNTER — Telehealth: Payer: Self-pay | Admitting: *Deleted

## 2012-10-24 ENCOUNTER — Telehealth: Payer: Self-pay | Admitting: Oncology

## 2012-10-24 ENCOUNTER — Ambulatory Visit (HOSPITAL_BASED_OUTPATIENT_CLINIC_OR_DEPARTMENT_OTHER): Payer: Medicare Other

## 2012-10-24 ENCOUNTER — Other Ambulatory Visit (HOSPITAL_BASED_OUTPATIENT_CLINIC_OR_DEPARTMENT_OTHER): Payer: Medicare Other | Admitting: Lab

## 2012-10-24 DIAGNOSIS — Z5111 Encounter for antineoplastic chemotherapy: Secondary | ICD-10-CM

## 2012-10-24 DIAGNOSIS — C3410 Malignant neoplasm of upper lobe, unspecified bronchus or lung: Secondary | ICD-10-CM

## 2012-10-24 DIAGNOSIS — C3490 Malignant neoplasm of unspecified part of unspecified bronchus or lung: Secondary | ICD-10-CM

## 2012-10-24 DIAGNOSIS — C7949 Secondary malignant neoplasm of other parts of nervous system: Secondary | ICD-10-CM

## 2012-10-24 DIAGNOSIS — R059 Cough, unspecified: Secondary | ICD-10-CM

## 2012-10-24 DIAGNOSIS — R05 Cough: Secondary | ICD-10-CM

## 2012-10-24 DIAGNOSIS — R609 Edema, unspecified: Secondary | ICD-10-CM

## 2012-10-24 DIAGNOSIS — C7931 Secondary malignant neoplasm of brain: Secondary | ICD-10-CM

## 2012-10-24 DIAGNOSIS — M549 Dorsalgia, unspecified: Secondary | ICD-10-CM

## 2012-10-24 LAB — CBC WITH DIFFERENTIAL/PLATELET
BASO%: 0.1 % (ref 0.0–2.0)
Basophils Absolute: 0 10e3/uL (ref 0.0–0.1)
EOS%: 0.5 % (ref 0.0–7.0)
Eosinophils Absolute: 0.1 10e3/uL (ref 0.0–0.5)
HCT: 41.5 % (ref 34.8–46.6)
HGB: 13 g/dL (ref 11.6–15.9)
LYMPH%: 14 % (ref 14.0–49.7)
MCH: 25.1 pg (ref 25.1–34.0)
MCHC: 31.3 g/dL — ABNORMAL LOW (ref 31.5–36.0)
MCV: 80.1 fL (ref 79.5–101.0)
MONO#: 0.9 10e3/uL (ref 0.1–0.9)
MONO%: 8.3 % (ref 0.0–14.0)
NEUT#: 7.9 10e3/uL — ABNORMAL HIGH (ref 1.5–6.5)
NEUT%: 77.1 % — ABNORMAL HIGH (ref 38.4–76.8)
Platelets: 265 10e3/uL (ref 145–400)
RBC: 5.18 10e6/uL (ref 3.70–5.45)
RDW: 22.4 % — ABNORMAL HIGH (ref 11.2–14.5)
WBC: 10.2 10e3/uL (ref 3.9–10.3)
lymph#: 1.4 10e3/uL (ref 0.9–3.3)

## 2012-10-24 LAB — COMPREHENSIVE METABOLIC PANEL (CC13)
ALT: 22 U/L (ref 0–55)
BUN: 15.3 mg/dL (ref 7.0–26.0)
CO2: 32 mEq/L — ABNORMAL HIGH (ref 22–29)
Calcium: 9 mg/dL (ref 8.4–10.4)
Chloride: 102 mEq/L (ref 98–107)
Creatinine: 0.8 mg/dL (ref 0.6–1.1)

## 2012-10-24 MED ORDER — SODIUM CHLORIDE 0.9 % IJ SOLN
10.0000 mL | INTRAMUSCULAR | Status: DC | PRN
  Administered 2012-10-24: 10 mL
  Filled 2012-10-24: qty 10

## 2012-10-24 MED ORDER — DEXAMETHASONE SODIUM PHOSPHATE 10 MG/ML IJ SOLN
10.0000 mg | Freq: Once | INTRAMUSCULAR | Status: AC
  Administered 2012-10-24: 10 mg via INTRAVENOUS

## 2012-10-24 MED ORDER — ONDANSETRON 8 MG/50ML IVPB (CHCC)
8.0000 mg | Freq: Once | INTRAVENOUS | Status: AC
  Administered 2012-10-24: 8 mg via INTRAVENOUS

## 2012-10-24 MED ORDER — HEPARIN SOD (PORK) LOCK FLUSH 100 UNIT/ML IV SOLN
500.0000 [IU] | Freq: Once | INTRAVENOUS | Status: AC | PRN
  Administered 2012-10-24: 500 [IU]
  Filled 2012-10-24: qty 5

## 2012-10-24 MED ORDER — SODIUM CHLORIDE 0.9 % IV SOLN
500.0000 mg/m2 | Freq: Once | INTRAVENOUS | Status: AC
  Administered 2012-10-24: 925 mg via INTRAVENOUS
  Filled 2012-10-24: qty 37

## 2012-10-24 MED ORDER — FUROSEMIDE 20 MG PO TABS: 20.0000 mg | ORAL_TABLET | Freq: Two times a day (BID) | ORAL | Status: DC | PRN

## 2012-10-24 MED ORDER — SODIUM CHLORIDE 0.9 % IV SOLN
Freq: Once | INTRAVENOUS | Status: AC
  Administered 2012-10-24: 11:00:00 via INTRAVENOUS

## 2012-10-24 NOTE — Progress Notes (Signed)
[facility]  [phone] [phone]   OFFICE PROGRESS NOTE   Cc:  CHAMBERLAIN,RACHEL, MD  PAST DIAGNOSIS AND PAST THERAPY:  1. 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 Her post resection surveillance colonoscopy onOctober 14, 2009, by Dr. Christella Hartigan was negative. 2. pT1a, pN0 well differentiated adenocarcinoma of lung; s/p left upper lobectomy on 06/16/2010. 3. 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  4. 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  She underwent stereotactic radiosurgery.  The left occipital 15mm target was treated using 4 Dynamic Conformal Arcs to a prescription dose of 20 Gy in 1 fraction. 6 MV photons, flattening filter free, were used. The right insula 4mm target was treated using 3 Circular Arcs to a prescription dose of 20 Gy in 1 fraction.  6 MV photons, flattening filter free, were used  CURRENT THERAPY: started maintenance chemo Alimta q3 weeks on 03/07/2012.    INTERVAL HISTORY: Lauren [name] 62 y.o. female returns for regular follow up with her daughter.  She reported that with Prednisone taper, her cough has improved. She has bilateral lower extremity edema and tightness due to fluid retention without frank pain.  She denied fever, SOB, abdominal pain, bleeding, skin rash, focal bone pain, headache, confusion, seizure.  The rest of the 14-point review of system was negative.    Past Medical History  Diagnosis Date  . Depression   . Hyperlipidemia   . Hypertension   . Lung nodule     FNA ordered for 04/02/10 by Jaymen Fetch>pos Ca  . Chronic folliculitis     of groin  . Fibrocystic breast changes   . Family history of  trichomonal vaginitis 05/2005  . Postmenopausal   . Depressive disorder   . Hypercholesterolemia   . GERD (gastroesophageal reflux disease)   . Cerebral aneurysm   . Back pain   . Shortness of breath   . COPD (chronic obstructive pulmonary disease)   . Coronary artery disease   . Arthritis   . Weight loss 10/22/2011  . Status post radiation therapy 11/16/11 - 12/29/11    Right Lung and Mediastinum: 60 Gy  . Status post chemotherapy comp. 12/29/11    Carboplatin/Taxol  . Colon cancer 11/08  . Lung cancer 06/16/10    PET scan 04/28/2010; primary: increase in size 02/2010 / Well Differentiated Adenocarcinoma of the lung   . Brain metastases 02/15/12  . S/P radiation therapy 03/01/12    SRS: 1 fraction / 20 Gray each to the Left Occipital Region and to the Right Insular Metastases  . On antineoplastic chemotherapy started 02/2012    Alimta    Past Surgical History  Procedure Laterality Date  . Colectomy  03/22/07    Stage 1 pT2 N0, M0 Adenocarcinoma of the sigmoid  colon  . Tubal ligation    . Lung lobectomy  06/16/10    Left Upper Lobectomy  . Hernia repair    . Breast surgery      Bil lumpectomy  . Back surgery    . Mediastinoscopy  10/19/2011    Procedure: MEDIASTINOSCOPY;  Surgeon: Alleen Borne, MD;  Location: [facility] OR;  Service: Thoracic;  Laterality: N/A;  . Cardiac cath x3  Current Outpatient Prescriptions  Medication Sig Dispense Refill  . albuterol (PROVENTIL HFA;VENTOLIN HFA) 108 (90 BASE) MCG/ACT inhaler Inhale 2 puffs into the lungs every 6 (six) hours as needed. For shortness of breath  2 Inhaler  11  . aspirin 81 MG EC tablet Take 81 mg by mouth every morning.       Marland Kitchen BYSTOLIC 10 MG tablet TAKE 1 TABLET BY MOUTH ONCE DAILY  30 tablet  5  . cloNIDine (CATAPRES) 0.1 MG tablet TAKE 1 TABLET BY MOUTH ONCE DAILY AT BEDTIME.  60 tablet  5  . diclofenac sodium (VOLTAREN) 1 % GEL Apply 2 g topically 4 (four) times daily. As needed for pain  100 g  11  . folic acid (FOLVITE) 1  MG tablet TAKE 1 TABLET BY MOUTH ONCE DAILY  30 tablet  1  . hydrochlorothiazide (HYDRODIURIL) 25 MG tablet TAKE 1 TABLET BY MOUTH ONCE DAILY  30 tablet  11  . lidocaine-prilocaine (EMLA) cream Apply topically as needed. Apply to porta cath site one hour prior to needle stick.  30 g  2  . mirtazapine (REMERON) 15 MG tablet Take 1 tablet (15 mg total) by mouth at bedtime.  30 tablet  1  . nitroGLYCERIN (NITROSTAT) 0.4 MG SL tablet Place 0.4 mg under the tongue every 5 (five) minutes as needed. For chest pain      . omeprazole (PRILOSEC) 20 MG capsule Take 1 capsule (20 mg total) by mouth daily.  30 capsule  1  . oxyCODONE-acetaminophen (PERCOCET/ROXICET) 5-325 MG per tablet Take 1-2 tablets by mouth every 6 (six) hours as needed for pain.  90 tablet  0  . potassium chloride SA (KLOR-CON M20) 20 MEQ tablet Take 1 tablet (20 mEq total) by mouth daily.  90 tablet  3  . pravastatin (PRAVACHOL) 40 MG tablet TAKE 2 TABLETS BY MOUTH AT BEDTIME  60 tablet  5  . predniSONE (DELTASONE) 10 MG tablet Take 6 tablets (60 mg total) by mouth daily. Take 60 mg daily for 14 days, then 50 mg daily for 3 days, then 40 mg daily for 3 days, then 30 mg daily for 3 days, then 20 mg daily for 3 days, then 10  Mg daily for 3 days, then stop.  160 tablet  1  . tiZANidine (ZANAFLEX) 2 MG tablet Take 1 tablet (2 mg total) by mouth every 6 (six) hours as needed (Back Muscle Spasm).  30 tablet  3  . traMADol (ULTRAM) 50 MG tablet Take 1 tablet (50 mg total) by mouth every 6 (six) hours as needed. As needed for pain.  90 tablet  11  . furosemide (LASIX) 20 MG tablet Take 1 tablet (20 mg total) by mouth 2 (two) times daily as needed (leg swelling.).  14 tablet  0  . [DISCONTINUED] buPROPion (WELLBUTRIN XL) 150 MG 24 hr tablet Take 1 tablet (150 mg total) by mouth 2 (two) times daily.  60 tablet  5   No current facility-administered medications for this visit.   Facility-Administered Medications Ordered in Other Visits  Medication  Dose Route Frequency Provider Last Rate Last Dose  . sodium chloride 0.9 % injection 10 mL  10 mL Intracatheter PRN Exie Parody, MD   10 mL at 10/24/12 1213    ALLERGIES:  is allergic to lisinopril.  REVIEW OF SYSTEMS:  The rest of the 14-point review of system was negative.   Filed Vitals:   10/24/12 1021  BP: 143/78  Pulse: 79  Temp: 97.5 F (36.4 C)  Resp: 18   Wt Readings from Last 3 Encounters:  10/24/12 165 lb (74.844 kg)  10/03/12 156 lb 11.2 oz (71.079 kg)  09/08/12 155 lb 11.2 oz (70.625 kg)   ECOG Performance status: 1  PHYSICAL EXAMINATION:   General:  Thin-appearing woman, in no acute distress.  Eyes:  no scleral icterus.  ENT:  There were no oropharyngeal lesions.  Neck was without thyromegaly.  Lymphatics:  Negative cervical, supraclavicular or axillary adenopathy.  Respiratory: lungs were clear bilaterally without wheezing or crackles.  Cardiovascular:  Regular rate and rhythm, S1/S2, without murmur, rub or gallop.  There was no pedal edema.  GI:  abdomen was soft, flat, nontender, nondistended, without organomegaly.  Muscoloskeletal:  no spinal tenderness of palpation of vertebral spine.  Skin exam was without echymosis, petichae.  Neuro exam was nonfocal.Patient was able to get on and off exam table without assistance.  Gait was normal.  Patient was alerted and oriented.  Attention was good.   Language was appropriate.  Mood was normal without depression.  Speech was not pressured.  Thought content was not tangential.     LABORATORY/RADIOLOGY DATA:  Lab Results  Component Value Date   WBC 10.2 10/24/2012   HGB 13.0 10/24/2012   HCT 41.5 10/24/2012   PLT 265 10/24/2012   GLUCOSE 111* 10/24/2012   CHOL 198 03/21/2012   TRIG 104 03/21/2012   HDL 53 03/21/2012   LDLDIRECT 111* 02/27/2010   LDLCALC 124* 03/21/2012   ALKPHOS 102 10/24/2012   ALT 22 10/24/2012   AST 9 10/24/2012   NA 143 10/24/2012   K 3.4* 10/24/2012   CL 102 10/24/2012   CREATININE 0.8 10/24/2012   BUN 15.3 10/24/2012    CO2 32* 10/24/2012   INR 1.10 10/19/2011    ASSESSMENT AND PLAN:   1. Recurrent Lung cancer now with brain med:  - Doing well on maintenance Alimta.  I recommended to continue with therapy.  2.  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  Her symptom has improved with Prednisone taper.  She has about one more week before Prednisone is tapered off.  To rule out possibility of disease progression, I requested a follow up CT chest mid July 2014 which will be two months from the last chest CT.   3.  Bilateral pedal edema:  Due to Prednisone.  I have low pretest suspicion for DVT.  I prescribed Lasix 20mg  PO BID x 1 week.   4.  Brain met:  S/p radiation.  She has follow up brain MRI soon with Dr. Basilio Cairo.   5.  Back pain with questionable uptake on bone scan but negative on MRI did not show bone met.  This is due to DJD.   6. History of colon cancer: She has no evidence of recurrent or metastatic disease for her colon cancer. Surveillance for her history of colon cancer entails once-a-year CT scan for 5 years' total.  She had a surveillance colonoscopy in October 2012 by Dr Christella Hartigan, which showed polyps. Per his recommendation, the next colonoscopy is due in Oct 2015 but sooner if she has symptoms.   7. Hypertension: She is on Bystolic, Catapres, HCTZ and per PCP.   8. Hypercholesterolemia: She is on pravastatin per PCP.   6. Follow up: in about 3 weeks.   7. CODE STATUS:  FULL CODE per previous discussion.        Cotton Beckley  Margaretmary Bayley, M.D.

## 2012-10-24 NOTE — Patient Instructions (Addendum)
[facility] Discharge Instructions for Patients Receiving Chemotherapy  Today you received the following chemotherapy agents alimta  To help prevent nausea and vomiting after your treatment, we encourage you to take your nausea medication if needed.   If you develop nausea and vomiting that is not controlled by your nausea medication, call the clinic.   BELOW ARE SYMPTOMS THAT SHOULD BE REPORTED IMMEDIATELY:  *FEVER GREATER THAN 100.5 F  *CHILLS WITH OR WITHOUT FEVER  NAUSEA AND VOMITING THAT IS NOT CONTROLLED WITH YOUR NAUSEA MEDICATION  *UNUSUAL SHORTNESS OF BREATH  *UNUSUAL BRUISING OR BLEEDING  TENDERNESS IN MOUTH AND THROAT WITH OR WITHOUT PRESENCE OF ULCERS  *URINARY PROBLEMS  *BOWEL PROBLEMS  UNUSUAL RASH Items with * indicate a potential emergency and should be followed up as soon as possible.  Feel free to call the clinic you have any questions or concerns. The clinic phone number is [phone].    

## 2012-10-24 NOTE — Telephone Encounter (Signed)
Per staff message and POF I have scheduled appts.  JMW  

## 2012-10-25 ENCOUNTER — Ambulatory Visit (INDEPENDENT_AMBULATORY_CARE_PROVIDER_SITE_OTHER): Payer: Medicare Other | Admitting: Family Medicine

## 2012-10-25 ENCOUNTER — Encounter: Payer: Self-pay | Admitting: Family Medicine

## 2012-10-25 VITALS — BP 132/75 | HR 93 | Temp 97.9°F | Wt 165.0 lb

## 2012-10-25 DIAGNOSIS — F330 Major depressive disorder, recurrent, mild: Secondary | ICD-10-CM

## 2012-10-25 DIAGNOSIS — R630 Anorexia: Secondary | ICD-10-CM

## 2012-10-25 DIAGNOSIS — I10 Essential (primary) hypertension: Secondary | ICD-10-CM

## 2012-10-25 MED ORDER — PRAVASTATIN SODIUM 40 MG PO TABS: 40.0000 mg | ORAL_TABLET | Freq: Every day | ORAL | Status: DC

## 2012-10-25 MED ORDER — OMEPRAZOLE 20 MG PO CPDR: 20.0000 mg | DELAYED_RELEASE_CAPSULE | Freq: Every day | ORAL | Status: DC

## 2012-10-25 MED ORDER — HYDROCHLOROTHIAZIDE 25 MG PO TABS: 25.0000 mg | ORAL_TABLET | Freq: Every day | ORAL | Status: DC

## 2012-10-25 MED ORDER — ALBUTEROL SULFATE HFA 108 (90 BASE) MCG/ACT IN AERS: 2.0000 | INHALATION_SPRAY | Freq: Four times a day (QID) | RESPIRATORY_TRACT | Status: DC | PRN

## 2012-10-25 MED ORDER — CLONIDINE HCL 0.1 MG PO TABS: 0.1000 mg | ORAL_TABLET | Freq: Two times a day (BID) | ORAL | Status: DC

## 2012-10-25 MED ORDER — DICLOFENAC SODIUM 1 % TD GEL: 2.0000 g | Freq: Four times a day (QID) | TRANSDERMAL | Status: DC

## 2012-10-25 MED ORDER — NITROGLYCERIN 0.4 MG SL SUBL: 0.4000 mg | SUBLINGUAL_TABLET | SUBLINGUAL | Status: DC | PRN

## 2012-10-25 MED ORDER — NEBIVOLOL HCL 10 MG PO TABS: 10.0000 mg | ORAL_TABLET | Freq: Every day | ORAL | Status: DC

## 2012-10-25 MED ORDER — MIRTAZAPINE 15 MG PO TABS: 15.0000 mg | ORAL_TABLET | Freq: Every day | ORAL | Status: DC

## 2012-10-25 NOTE — Progress Notes (Signed)
  Subjective:    Patient ID: Lauren [name], female    DOB: 07-Nov-1950, 62 y.o.   MRN: [id number]  HPI:  Lauren [name] comes in for follow up.  She has been seeing Oncology because she has fluid "under" her lung.  She also has fluid in her legs.  They have started her on a fluid pill and prednisone for this.  The swelling is better, so is her breathing.   Appetite: has gained some weight back, she says the prednisone makes her eat everything.  However, she also feels like the mirtazapine helped and wants to continue it. She also says her mood has improved some and she is sleeping better at night time.   HTN: Taking Bystolic and HCTZ.  No chest pain, dyspnea, palpitations. No dizziness.   Past Medical History  Diagnosis Date  . Depression   . Hyperlipidemia   . Hypertension   . Lung nodule     FNA ordered for 04/02/10 by HA>pos Ca  . Chronic folliculitis     of groin  . Fibrocystic breast changes   . Family history of trichomonal vaginitis 05/2005  . Postmenopausal   . Depressive disorder   . Hypercholesterolemia   . GERD (gastroesophageal reflux disease)   . Cerebral aneurysm   . Back pain   . Shortness of breath   . COPD (chronic obstructive pulmonary disease)   . Coronary artery disease   . Arthritis   . Weight loss 10/22/2011  . Status post radiation therapy 11/16/11 - 12/29/11    Right Lung and Mediastinum: 60 Gy  . Status post chemotherapy comp. 12/29/11    Carboplatin/Taxol  . Colon cancer 11/08  . Lung cancer 06/16/10    PET scan 04/28/2010; primary: increase in size 02/2010 / Well Differentiated Adenocarcinoma of the lung   . Brain metastases 02/15/12  . S/P radiation therapy 03/01/12    SRS: 1 fraction / 20 Gray each to the Left Occipital Region and to the Right Insular Metastases  . On antineoplastic chemotherapy started 02/2012    Alimta    History  Substance Use Topics  . Smoking status: Current Every Day Smoker -- 1.00 packs/day    Types: Cigarettes  . Smokeless  tobacco: Current User  . Alcohol Use: 0.0 oz/week     Comment: occasional    Family History  Problem Relation Age of Onset  . Stomach cancer Maternal Aunt   . Breast cancer Cousin   . Cancer Sister     Lymphatic  . Osteoarthritis Father   . Gout Father   . Hypertension Father   . Heart disease Mother     pericarditis;   . Anesthesia problems Neg Hx      ROS Pertinent items in HPI    Objective:  Physical Exam:  BP 132/75  Pulse 93  Temp(Src) 97.9 F (36.6 C) (Oral)  Wt 165 lb (74.844 kg)  BMI 28.31 kg/m2 General appearance: alert, cooperative and no distress Head: Normocephalic, without obvious abnormality, atraumatic Lungs: clear to auscultation bilaterally Heart: regular rate and rhythm, S1, S2 normal, no murmur, click, rub or gallop Pulses: 2+ and symmetric Legs: 1+  Edema of feet       Assessment & Plan:

## 2012-10-25 NOTE — Assessment & Plan Note (Signed)
Well-controlled, continue current medications

## 2012-10-25 NOTE — Assessment & Plan Note (Signed)
Improved, but patient has gained quite a bit of weight.  Will continue mirtazapine for depression and appetite, but monitor weight once she is off prednisone.

## 2012-10-25 NOTE — Patient Instructions (Signed)
It was good to see you.  Your blood pressure today was BP: 132/75 mmHg.  Remember your goal blood pressure is about 130/80.  Please be sure to take your medication every day.    Please continue your medications at the current doses.  I have sent in 6 months of your medications.  Please make an appointment to meet your new doctor in 2-3 months.

## 2012-10-25 NOTE — Assessment & Plan Note (Signed)
Improved, continue Mirtazapine.  

## 2012-10-27 ENCOUNTER — Encounter: Payer: Self-pay | Admitting: Radiation Oncology

## 2012-10-27 NOTE — Progress Notes (Signed)
Ms. Slavick phoned today - said she needed to get meds from pharmacy and they told her that her funds were about to run out.  Explained to her about the funding being a one-time fund and that she only had $23.33 left currently.  She was okay with this information.

## 2012-10-31 ENCOUNTER — Other Ambulatory Visit: Payer: Self-pay | Admitting: Oncology

## 2012-11-14 ENCOUNTER — Telehealth: Payer: Self-pay | Admitting: *Deleted

## 2012-11-14 ENCOUNTER — Ambulatory Visit (HOSPITAL_BASED_OUTPATIENT_CLINIC_OR_DEPARTMENT_OTHER): Payer: Medicare Other

## 2012-11-14 ENCOUNTER — Ambulatory Visit (HOSPITAL_BASED_OUTPATIENT_CLINIC_OR_DEPARTMENT_OTHER): Payer: Medicare Other | Admitting: Oncology

## 2012-11-14 ENCOUNTER — Other Ambulatory Visit (HOSPITAL_BASED_OUTPATIENT_CLINIC_OR_DEPARTMENT_OTHER): Payer: Medicare Other

## 2012-11-14 ENCOUNTER — Encounter: Payer: Self-pay | Admitting: Oncology

## 2012-11-14 DIAGNOSIS — C3410 Malignant neoplasm of upper lobe, unspecified bronchus or lung: Secondary | ICD-10-CM

## 2012-11-14 DIAGNOSIS — M549 Dorsalgia, unspecified: Secondary | ICD-10-CM

## 2012-11-14 DIAGNOSIS — Z85038 Personal history of other malignant neoplasm of large intestine: Secondary | ICD-10-CM

## 2012-11-14 DIAGNOSIS — C7949 Secondary malignant neoplasm of other parts of nervous system: Secondary | ICD-10-CM

## 2012-11-14 DIAGNOSIS — C7931 Secondary malignant neoplasm of brain: Secondary | ICD-10-CM

## 2012-11-14 DIAGNOSIS — R609 Edema, unspecified: Secondary | ICD-10-CM

## 2012-11-14 DIAGNOSIS — Z5111 Encounter for antineoplastic chemotherapy: Secondary | ICD-10-CM

## 2012-11-14 LAB — COMPREHENSIVE METABOLIC PANEL (CC13)
BUN: 9 mg/dL (ref 7.0–26.0)
CO2: 31 mEq/L — ABNORMAL HIGH (ref 22–29)
Calcium: 9 mg/dL (ref 8.4–10.4)
Chloride: 101 mEq/L (ref 98–109)
Creatinine: 0.7 mg/dL (ref 0.6–1.1)

## 2012-11-14 LAB — CBC WITH DIFFERENTIAL/PLATELET
BASO%: 0.4 % (ref 0.0–2.0)
Basophils Absolute: 0 10*3/uL (ref 0.0–0.1)
HGB: 12.8 g/dL (ref 11.6–15.9)
MCH: 25.9 pg (ref 25.1–34.0)
NEUT#: 3.7 10*3/uL (ref 1.5–6.5)
RBC: 4.94 10*6/uL (ref 3.70–5.45)
RDW: 19.6 % — ABNORMAL HIGH (ref 11.2–14.5)
lymph#: 0.8 10*3/uL — ABNORMAL LOW (ref 0.9–3.3)

## 2012-11-14 MED ORDER — HEPARIN SOD (PORK) LOCK FLUSH 100 UNIT/ML IV SOLN
500.0000 [IU] | Freq: Once | INTRAVENOUS | Status: AC | PRN
  Administered 2012-11-14: 500 [IU]
  Filled 2012-11-14: qty 5

## 2012-11-14 MED ORDER — CYANOCOBALAMIN 1000 MCG/ML IJ SOLN
1000.0000 ug | Freq: Once | INTRAMUSCULAR | Status: AC
  Administered 2012-11-14: 1000 ug via INTRAMUSCULAR

## 2012-11-14 MED ORDER — DEXAMETHASONE SODIUM PHOSPHATE 10 MG/ML IJ SOLN
10.0000 mg | Freq: Once | INTRAMUSCULAR | Status: AC
  Administered 2012-11-14: 10 mg via INTRAVENOUS

## 2012-11-14 MED ORDER — ONDANSETRON 8 MG/50ML IVPB (CHCC)
8.0000 mg | Freq: Once | INTRAVENOUS | Status: AC
  Administered 2012-11-14: 8 mg via INTRAVENOUS

## 2012-11-14 MED ORDER — SODIUM CHLORIDE 0.9 % IV SOLN
Freq: Once | INTRAVENOUS | Status: AC
  Administered 2012-11-14: 11:00:00 via INTRAVENOUS

## 2012-11-14 MED ORDER — SODIUM CHLORIDE 0.9 % IV SOLN
500.0000 mg/m2 | Freq: Once | INTRAVENOUS | Status: AC
  Administered 2012-11-14: 925 mg via INTRAVENOUS
  Filled 2012-11-14: qty 37

## 2012-11-14 MED ORDER — SODIUM CHLORIDE 0.9 % IJ SOLN
10.0000 mL | INTRAMUSCULAR | Status: DC | PRN
  Administered 2012-11-14: 10 mL
  Filled 2012-11-14: qty 10

## 2012-11-14 NOTE — Patient Instructions (Addendum)
[facility] Discharge Instructions for Patients Receiving Chemotherapy  Today you received the following chemotherapy agents : Alimta  To help prevent nausea and vomiting after your treatment, we encourage you to take your nausea medication as directed by your MD.   If you develop nausea and vomiting that is not controlled by your nausea medication, call the clinic.   BELOW ARE SYMPTOMS THAT SHOULD BE REPORTED IMMEDIATELY:  *FEVER GREATER THAN 100.5 F  *CHILLS WITH OR WITHOUT FEVER  NAUSEA AND VOMITING THAT IS NOT CONTROLLED WITH YOUR NAUSEA MEDICATION  *UNUSUAL SHORTNESS OF BREATH  *UNUSUAL BRUISING OR BLEEDING  TENDERNESS IN MOUTH AND THROAT WITH OR WITHOUT PRESENCE OF ULCERS  *URINARY PROBLEMS  *BOWEL PROBLEMS  UNUSUAL RASH Items with * indicate a potential emergency and should be followed up as soon as possible.  Feel free to call the clinic you have any questions or concerns. The clinic phone number is [phone].

## 2012-11-14 NOTE — Telephone Encounter (Signed)
Pt states has not been taking the Potassium or Folic Acid for at least one week due to not having the money to pick up her refills.  She says she will get the potassium and resume taking it.

## 2012-11-14 NOTE — Telephone Encounter (Signed)
Message copied by Wende Mott on Mon Nov 14, 2012  3:44 PM ------      Message from: Clenton Pare R      Created: Mon Nov 14, 2012  3:34 PM      Regarding: Potassium level       Please call pt and verify that she is taking KDur (should be on this once a day according to med list). Advise her to increase her KDur to BID x 1 week. We will recheck labs at next visit.  ------

## 2012-11-14 NOTE — Progress Notes (Signed)
[facility]  Telephone:([phone] Fax:(336) [phone]   OFFICE PROGRESS NOTE   Cc:  CHAMBERLAIN,RACHEL, MD  PAST DIAGNOSIS AND PAST THERAPY:  1. 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 Her post resection surveillance colonoscopy onOctober 14, 2009, by Dr. Christella Hartigan was negative. 2. pT1a, pN0 well differentiated adenocarcinoma of lung; s/p left upper lobectomy on 06/16/2010. 3. 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  4. 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  She underwent stereotactic radiosurgery.  The left occipital 15mm target was treated using 4 Dynamic Conformal Arcs to a prescription dose of 20 Gy in 1 fraction. 6 MV photons, flattening filter free, were used. The right insula 4mm target was treated using 3 Circular Arcs to a prescription dose of 20 Gy in 1 fraction.  6 MV photons, flattening filter free, were used  CURRENT THERAPY: started maintenance chemo Alimta q3 weeks on 03/07/2012.    INTERVAL HISTORY: Lauren [name] 62 y.o. female returns for regular follow up by herself.  She has now completed her prednisone taper. Denies cough. Lower extremity edema has resolved. She denied fever, SOB, abdominal pain, bleeding, skin rash, focal bone pain, headache, confusion, seizure.  The rest of the 14-point review of system was negative.    Past Medical History  Diagnosis Date  . Depression   . Hyperlipidemia   . Hypertension   . Lung nodule     FNA ordered for 04/02/10 by HA>pos Ca  . Chronic folliculitis     of groin  . Fibrocystic breast changes   . Family history of trichomonal vaginitis 05/2005  . Postmenopausal   . Depressive disorder     . Hypercholesterolemia   . GERD (gastroesophageal reflux disease)   . Cerebral aneurysm   . Back pain   . Shortness of breath   . COPD (chronic obstructive pulmonary disease)   . Coronary artery disease   . Arthritis   . Weight loss 10/22/2011  . Status post radiation therapy 11/16/11 - 12/29/11    Right Lung and Mediastinum: 60 Gy  . Status post chemotherapy comp. 12/29/11    Carboplatin/Taxol  . Colon cancer 11/08  . Lung cancer 06/16/10    PET scan 04/28/2010; primary: increase in size 02/2010 / Well Differentiated Adenocarcinoma of the lung   . Brain metastases 02/15/12  . S/P radiation therapy 03/01/12    SRS: 1 fraction / 20 Gray each to the Left Occipital Region and to the Right Insular Metastases  . On antineoplastic chemotherapy started 02/2012    Alimta    Past Surgical History  Procedure Laterality Date  . Colectomy  03/22/07    Stage 1 pT2 N0, M0 Adenocarcinoma of the sigmoid  colon  . Tubal ligation    . Lung lobectomy  06/16/10    Left Upper Lobectomy  . Hernia repair    . Breast surgery      Bil lumpectomy  . Back surgery    . Mediastinoscopy  10/19/2011    Procedure: MEDIASTINOSCOPY;  Surgeon: Alleen Borne, MD;  Location: [facility] OR;  Service: Thoracic;  Laterality: N/A;  . Cardiac cath x3      Current Outpatient Prescriptions  Medication Sig Dispense Refill  . albuterol (PROVENTIL  HFA;VENTOLIN HFA) 108 (90 BASE) MCG/ACT inhaler Inhale 2 puffs into the lungs every 6 (six) hours as needed. For shortness of breath  2 Inhaler  11  . aspirin 81 MG EC tablet Take 81 mg by mouth every morning.       . cloNIDine (CATAPRES) 0.1 MG tablet Take 1 tablet (0.1 mg total) by mouth 2 (two) times daily.  60 tablet  5  . diclofenac sodium (VOLTAREN) 1 % GEL Apply 2 g topically 4 (four) times daily. As needed for pain  100 g  11  . folic acid (FOLVITE) 1 MG tablet TAKE 1 TABLET BY MOUTH ONCE DAILY  30 tablet  1  . hydrochlorothiazide (HYDRODIURIL) 25 MG tablet Take 1 tablet (25 mg  total) by mouth daily.  30 tablet  5  . lidocaine-prilocaine (EMLA) cream Apply topically as needed. Apply to porta cath site one hour prior to needle stick.  30 g  2  . mirtazapine (REMERON) 15 MG tablet Take 1 tablet (15 mg total) by mouth at bedtime.  30 tablet  5  . nebivolol (BYSTOLIC) 10 MG tablet Take 1 tablet (10 mg total) by mouth daily.  30 tablet  5  . nitroGLYCERIN (NITROSTAT) 0.4 MG SL tablet Place 1 tablet (0.4 mg total) under the tongue every 5 (five) minutes as needed. For chest pain  10 tablet  0  . omeprazole (PRILOSEC) 20 MG capsule Take 1 capsule (20 mg total) by mouth daily.  30 capsule  5  . potassium chloride SA (KLOR-CON M20) 20 MEQ tablet Take 1 tablet (20 mEq total) by mouth daily.  90 tablet  3  . pravastatin (PRAVACHOL) 40 MG tablet Take 1 tablet (40 mg total) by mouth daily.  30 tablet  5  . tiZANidine (ZANAFLEX) 2 MG tablet Take 1 tablet (2 mg total) by mouth every 6 (six) hours as needed (Back Muscle Spasm).  30 tablet  3  . traMADol (ULTRAM) 50 MG tablet Take 1 tablet (50 mg total) by mouth every 6 (six) hours as needed. As needed for pain.  90 tablet  11  . [DISCONTINUED] buPROPion (WELLBUTRIN XL) 150 MG 24 hr tablet Take 1 tablet (150 mg total) by mouth 2 (two) times daily.  60 tablet  5   No current facility-administered medications for this visit.    ALLERGIES:  is allergic to lisinopril.  REVIEW OF SYSTEMS:  The rest of the 14-point review of system was negative.   Filed Vitals:   11/14/12 0938  BP: 140/65  Pulse: 80  Temp: 98 F (36.7 C)  Resp: 18   Wt Readings from Last 3 Encounters:  11/14/12 166 lb 11.2 oz (75.615 kg)  10/25/12 165 lb (74.844 kg)  10/24/12 165 lb (74.844 kg)   ECOG Performance status: 1  PHYSICAL EXAMINATION:   General:  Thin-appearing woman, in no acute distress.  Eyes:  no scleral icterus.  ENT:  There were no oropharyngeal lesions.  Neck was without thyromegaly.  Lymphatics:  Negative cervical, supraclavicular or  axillary adenopathy.  Respiratory: lungs were clear bilaterally without wheezing or crackles.  Cardiovascular:  Regular rate and rhythm, S1/S2, without murmur, rub or gallop.  There was no pedal edema.  GI:  abdomen was soft, flat, nontender, nondistended, without organomegaly.  Muscoloskeletal:  no spinal tenderness of palpation of vertebral spine.  Skin exam was without echymosis, petichae.  Neuro exam was nonfocal.Patient was able to get on and off exam table without assistance.  Gait was normal.  Patient was alerted and oriented.  Attention was good.   Language was appropriate.  Mood was normal without depression.  Speech was not pressured.  Thought content was not tangential.     LABORATORY/RADIOLOGY DATA:  Lab Results  Component Value Date   WBC 5.2 11/14/2012   HGB 12.8 11/14/2012   HCT 39.4 11/14/2012   PLT 236 11/14/2012   GLUCOSE 111* 10/24/2012   CHOL 198 03/21/2012   TRIG 104 03/21/2012   HDL 53 03/21/2012   LDLDIRECT 111* 02/27/2010   LDLCALC 124* 03/21/2012   ALKPHOS 102 10/24/2012   ALT 22 10/24/2012   AST 9 10/24/2012   NA 143 10/24/2012   K 3.4* 10/24/2012   CL 102 10/24/2012   CREATININE 0.8 10/24/2012   BUN 15.3 10/24/2012   CO2 32* 10/24/2012   INR 1.10 10/19/2011    ASSESSMENT AND PLAN:   1. Recurrent Lung cancer now with brain med:  - Doing well on maintenance Alimta.  I recommended to continue with therapy.  2.  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 status post prednisone taper. Cough has resolved. To rule out possibility of disease progression, she will have a CT chest mid July 2014 which will be two months from the last chest CT.   3.  Bilateral pedal edema:  Resolved since completing prednisone taper.  4.  Brain met:  S/p radiation.  She has follow up brain MRI soon with Dr. Basilio Cairo.   5.  Back pain with questionable uptake on bone scan but negative on MRI did not show bone met.  This is due to DJD.   6. History of  colon cancer: She has no evidence of recurrent or metastatic disease for her colon cancer. Surveillance for her history of colon cancer entails once-a-year CT scan for 5 years' total.  She had a surveillance colonoscopy in October 2012 by Dr Christella Hartigan, which showed polyps. Per his recommendation, the next colonoscopy is due in Oct 2015 but sooner if she has symptoms.   7. Hypertension: She is on Bystolic, Catapres, HCTZ and per PCP.   8. Hypercholesterolemia: She is on pravastatin per PCP.   9. Follow up: in about 3 weeks.   10. CODE STATUS:  FULL CODE per previous discussion.

## 2012-11-14 NOTE — Telephone Encounter (Signed)
Instructed pt to resume K dur once daily.  She verbalized understanding.

## 2012-11-14 NOTE — Telephone Encounter (Signed)
OK. In that case she only needs to take the KDur once a day.

## 2012-11-23 ENCOUNTER — Other Ambulatory Visit: Payer: Self-pay | Admitting: Oncology

## 2012-11-23 ENCOUNTER — Ambulatory Visit (HOSPITAL_BASED_OUTPATIENT_CLINIC_OR_DEPARTMENT_OTHER): Payer: Medicare Other

## 2012-11-23 ENCOUNTER — Telehealth: Payer: Self-pay | Admitting: *Deleted

## 2012-11-23 ENCOUNTER — Ambulatory Visit: Payer: Medicare Other | Admitting: Radiation Oncology

## 2012-11-23 ENCOUNTER — Ambulatory Visit
Admission: RE | Admit: 2012-11-23 | Discharge: 2012-11-23 | Disposition: A | Discharge status: Home / Self Care | Payer: Medicare Other | Source: Ambulatory Visit | Attending: Radiation Oncology | Admitting: Radiation Oncology

## 2012-11-23 ENCOUNTER — Ambulatory Visit (HOSPITAL_COMMUNITY): Payer: Medicare Other

## 2012-11-23 VITALS — BP 121/72 | HR 90 | Temp 97.4°F | Resp 18

## 2012-11-23 DIAGNOSIS — C3490 Malignant neoplasm of unspecified part of unspecified bronchus or lung: Secondary | ICD-10-CM

## 2012-11-23 DIAGNOSIS — C7931 Secondary malignant neoplasm of brain: Secondary | ICD-10-CM

## 2012-11-23 DIAGNOSIS — Z452 Encounter for adjustment and management of vascular access device: Secondary | ICD-10-CM

## 2012-11-23 MED ORDER — FOLIC ACID 1 MG PO TABS: ORAL_TABLET | ORAL | Status: DC

## 2012-11-23 MED ORDER — GADOBENATE DIMEGLUMINE 529 MG/ML IV SOLN
15.0000 mL | Freq: Once | INTRAVENOUS | Status: AC | PRN
  Administered 2012-11-23: 15 mL via INTRAVENOUS

## 2012-11-23 MED ORDER — SODIUM CHLORIDE 0.9 % IJ SOLN
10.0000 mL | INTRAMUSCULAR | Status: DC | PRN
  Administered 2012-11-23: 10 mL via INTRAVENOUS
  Filled 2012-11-23: qty 10

## 2012-11-23 MED ORDER — HEPARIN SOD (PORK) LOCK FLUSH 100 UNIT/ML IV SOLN
500.0000 [IU] | Freq: Once | INTRAVENOUS | Status: AC
  Administered 2012-11-23: 500 [IU] via INTRAVENOUS
  Filled 2012-11-23: qty 5

## 2012-11-23 NOTE — Telephone Encounter (Signed)
Pt reports new onset Headache x 2 days.  Left back of head/neck radiates to top of head, throbbing and tight w/ associated nausea and blurry vision left eye while h/a persists.  It was relieved by taking some ms contin and laying down last night, but recurred about 4 hrs later.  It has been intermittent for 2 days but much worse last night and pt states "this is no ordinary headache."  Denies any weakness or numbness in extremities. She has MRI Brain scheduled for 7/22 and sees Dr. Phoebe Perch on 7/23.  Asks if MRI can be done any sooner?  This was ordered by Dr. Basilio Cairo.

## 2012-11-23 NOTE — Patient Instructions (Addendum)
Call MD for problems 

## 2012-11-23 NOTE — Telephone Encounter (Signed)
  Pt reports new onset Headache x 2 days. Left back of head/neck radiates to top of head, throbbing and tight w/ associated nausea and blurry vision left eye while h/a persists. It was relieved by taking some ms contin and laying down last night, but recurred about 4 hrs later. It has been intermittent for 2 days but much worse last night and pt states "this is no ordinary headache." Denies any weakness or numbness in extremities. She has MRI Brain scheduled for 7/22 and sees Dr. Phoebe Perch on 7/23. Asks if MRI can be done any sooner? This was ordered by Dr. Basilio Cairo.   MRI moved to today at 3 pm per Dr. Gaylyn Rong orders.  Instructed pt to arrive to [facility] at 2:45 pm for MRI.  She verbalized understanding.

## 2012-11-23 NOTE — Telephone Encounter (Signed)
Called Lauren [name] to inform her that her MRI from today shows regression of her lesions in her brain.  She reports now that she no longer has a headache nor any blurred/double vision and feels"fine." Requested she call if these symptoms reoccur and she stated she would.  The report is as follows:  IMPRESSION:  1. Interval regression of the two small intracranial metastases;  - Mild further decreased size of the left occipital pole enhancing  metastasis.  - Right insula metastasis no longer enhancing.  2. No new intracranial metastasis identified. There is a new but  chronic-appearing micro hemorrhage in the right periatrial white  matter, significance doubtful.  3. Otherwise no new intracranial abnormality.

## 2012-11-30 ENCOUNTER — Encounter (HOSPITAL_COMMUNITY): Payer: Self-pay

## 2012-11-30 ENCOUNTER — Ambulatory Visit (HOSPITAL_COMMUNITY)
Admission: RE | Admit: 2012-11-30 | Discharge: 2012-11-30 | Disposition: A | Discharge status: Home / Self Care | Payer: Medicare Other | Source: Ambulatory Visit | Attending: Oncology | Admitting: Oncology

## 2012-11-30 DIAGNOSIS — E041 Nontoxic single thyroid nodule: Secondary | ICD-10-CM | POA: Insufficient documentation

## 2012-11-30 DIAGNOSIS — R609 Edema, unspecified: Secondary | ICD-10-CM

## 2012-11-30 DIAGNOSIS — C801 Malignant (primary) neoplasm, unspecified: Secondary | ICD-10-CM | POA: Insufficient documentation

## 2012-11-30 DIAGNOSIS — J90 Pleural effusion, not elsewhere classified: Secondary | ICD-10-CM | POA: Insufficient documentation

## 2012-11-30 DIAGNOSIS — I2510 Atherosclerotic heart disease of native coronary artery without angina pectoris: Secondary | ICD-10-CM | POA: Insufficient documentation

## 2012-11-30 DIAGNOSIS — I700 Atherosclerosis of aorta: Secondary | ICD-10-CM | POA: Insufficient documentation

## 2012-11-30 DIAGNOSIS — C189 Malignant neoplasm of colon, unspecified: Secondary | ICD-10-CM | POA: Insufficient documentation

## 2012-11-30 DIAGNOSIS — I7090 Unspecified atherosclerosis: Secondary | ICD-10-CM | POA: Insufficient documentation

## 2012-11-30 DIAGNOSIS — Z79899 Other long term (current) drug therapy: Secondary | ICD-10-CM | POA: Insufficient documentation

## 2012-11-30 DIAGNOSIS — C3490 Malignant neoplasm of unspecified part of unspecified bronchus or lung: Secondary | ICD-10-CM | POA: Insufficient documentation

## 2012-11-30 MED ORDER — IOHEXOL 300 MG/ML  SOLN
80.0000 mL | Freq: Once | INTRAMUSCULAR | Status: AC | PRN
  Administered 2012-11-30: 80 mL via INTRAVENOUS

## 2012-12-01 ENCOUNTER — Telehealth: Payer: Self-pay | Admitting: Oncology

## 2012-12-01 ENCOUNTER — Telehealth: Payer: Self-pay | Admitting: *Deleted

## 2012-12-01 ENCOUNTER — Other Ambulatory Visit (HOSPITAL_BASED_OUTPATIENT_CLINIC_OR_DEPARTMENT_OTHER): Payer: Medicare Other | Admitting: Lab

## 2012-12-01 ENCOUNTER — Ambulatory Visit (HOSPITAL_BASED_OUTPATIENT_CLINIC_OR_DEPARTMENT_OTHER): Payer: Medicare Other | Admitting: Oncology

## 2012-12-01 DIAGNOSIS — R609 Edema, unspecified: Secondary | ICD-10-CM

## 2012-12-01 DIAGNOSIS — Z85038 Personal history of other malignant neoplasm of large intestine: Secondary | ICD-10-CM

## 2012-12-01 DIAGNOSIS — C7931 Secondary malignant neoplasm of brain: Secondary | ICD-10-CM

## 2012-12-01 DIAGNOSIS — C3490 Malignant neoplasm of unspecified part of unspecified bronchus or lung: Secondary | ICD-10-CM

## 2012-12-01 DIAGNOSIS — C7949 Secondary malignant neoplasm of other parts of nervous system: Secondary | ICD-10-CM

## 2012-12-01 LAB — CBC WITH DIFFERENTIAL/PLATELET
Basophils Absolute: 0 10*3/uL (ref 0.0–0.1)
Eosinophils Absolute: 0.1 10*3/uL (ref 0.0–0.5)
HGB: 12.5 g/dL (ref 11.6–15.9)
LYMPH%: 14.7 % (ref 14.0–49.7)
MCV: 81.1 fL (ref 79.5–101.0)
MONO#: 0.6 10*3/uL (ref 0.1–0.9)
MONO%: 10.9 % (ref 0.0–14.0)
NEUT#: 4.1 10*3/uL (ref 1.5–6.5)
Platelets: 206 10*3/uL (ref 145–400)

## 2012-12-01 LAB — COMPREHENSIVE METABOLIC PANEL (CC13)
Albumin: 3 g/dL — ABNORMAL LOW (ref 3.5–5.0)
Alkaline Phosphatase: 122 U/L (ref 40–150)
BUN: 8.2 mg/dL (ref 7.0–26.0)
CO2: 28 mEq/L (ref 22–29)
Glucose: 94 mg/dl (ref 70–140)
Potassium: 3.3 mEq/L — ABNORMAL LOW (ref 3.5–5.1)
Total Bilirubin: 0.57 mg/dL (ref 0.20–1.20)

## 2012-12-01 NOTE — Telephone Encounter (Signed)
Per staff message and POF I have scheduled appts.  JMW  

## 2012-12-01 NOTE — Telephone Encounter (Signed)
gv and printed appt sched and avs forltp...MW added tx.Marland Kitchen

## 2012-12-02 NOTE — Progress Notes (Signed)
[facility]  Telephone:(336) [phone] Fax:(336) [phone]   OFFICE PROGRESS NOTE   Cc:  Rodman Pickle, MD  PAST DIAGNOSIS AND PAST THERAPY:  1. 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 Her post resection surveillance colonoscopy onOctober 14, 2009, by Dr. Christella Hartigan was negative. 2. pT1a, pN0 well differentiated adenocarcinoma of lung; s/p left upper lobectomy on 06/16/2010. 3. 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  4. 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  She underwent stereotactic radiosurgery.  The left occipital 15mm target was treated using 4 Dynamic Conformal Arcs to a prescription dose of 20 Gy in 1 fraction. 6 MV photons, flattening filter free, were used. The right insula 4mm target was treated using 3 Circular Arcs to a prescription dose of 20 Gy in 1 fraction.  6 MV photons, flattening filter free, were used  CURRENT THERAPY: started maintenance chemo Alimta q3 weeks on 03/07/2012.    INTERVAL HISTORY: Lauren [name] 62 y.o. female returns for regular follow up with by herself.  She had an episode of severe left side headache last week.  MRI brain showed shrinkage of the previously treated brain met without new lesions.  The head ache spontaneously resolved. She denied focal motor weakness, dysphagia, dysarthria, gait problem.  She still smokes cigarettes and is not interested in stopping.  She tried electronic cigarettes and did not like it.  She had no success with nicotine patch/gum in the past.  She has mild fatigue; however, she is still independent of all activities of daily living. She denied side effects of chemo.  She denied fever,  mucositis, nausea/vomiting, SOB, chest pain, abd pain, bleeding symptoms.  She has stable bilateral pedal edema which improved with leg elevation. The rest of the 14-point review of system was negative.    Past Medical History  Diagnosis Date  . Depression   . Hyperlipidemia   . Hypertension   . Lung nodule     FNA ordered for 04/02/10 by Jaxyn Mestas>pos Ca  . Chronic folliculitis     of groin  . Fibrocystic breast changes   . Family history of trichomonal vaginitis 05/2005  . Postmenopausal   . Depressive disorder   . Hypercholesterolemia   . GERD (gastroesophageal reflux disease)   . Cerebral aneurysm   . Back pain   . Shortness of breath   . COPD (chronic obstructive pulmonary disease)   . Coronary artery disease   . Arthritis   . Weight loss 10/22/2011  . Status post radiation therapy 11/16/11 - 12/29/11    Right Lung and Mediastinum: 60 Gy  . Status post chemotherapy comp. 12/29/11    Carboplatin/Taxol  . Colon cancer 11/08  . Lung cancer 06/16/10    PET scan 04/28/2010; primary: increase in size 02/2010 / Well Differentiated Adenocarcinoma of the lung   . Brain metastases 02/15/12  . S/P radiation therapy 03/01/12    SRS: 1 fraction / 20 Gray each to the Left Occipital Region and to the Right Insular Metastases  . On antineoplastic chemotherapy started 02/2012    Alimta    Past Surgical History  Procedure Laterality Date  . Colectomy  03/22/07    Stage 1 pT2 N0, M0 Adenocarcinoma of the sigmoid  colon  . Tubal ligation    . Lung lobectomy  06/16/10    Left Upper Lobectomy  . Hernia repair    . Breast surgery      Bil lumpectomy  . Back surgery    . Mediastinoscopy  10/19/2011    Procedure: MEDIASTINOSCOPY;  Surgeon: Alleen Borne, MD;  Location: [facility] OR;  Service: Thoracic;  Laterality: N/A;  . Cardiac cath x3      Current Outpatient Prescriptions  Medication Sig Dispense Refill  . albuterol (PROVENTIL HFA;VENTOLIN HFA) 108 (90 BASE) MCG/ACT inhaler Inhale 2 puffs into the  lungs every 6 (six) hours as needed. For shortness of breath  2 Inhaler  11  . aspirin 81 MG EC tablet Take 81 mg by mouth every morning.       . cloNIDine (CATAPRES) 0.1 MG tablet Take 1 tablet (0.1 mg total) by mouth 2 (two) times daily.  60 tablet  5  . diclofenac sodium (VOLTAREN) 1 % GEL Apply 2 g topically 4 (four) times daily. As needed for pain  100 g  11  . folic acid (FOLVITE) 1 MG tablet TAKE 1 TABLET BY MOUTH ONCE DAILY  30 tablet  1  . hydrochlorothiazide (HYDRODIURIL) 25 MG tablet Take 1 tablet (25 mg total) by mouth daily.  30 tablet  5  . lidocaine-prilocaine (EMLA) cream Apply topically as needed. Apply to porta cath site one hour prior to needle stick.  30 g  2  . mirtazapine (REMERON) 15 MG tablet Take 1 tablet (15 mg total) by mouth at bedtime.  30 tablet  5  . nebivolol (BYSTOLIC) 10 MG tablet Take 1 tablet (10 mg total) by mouth daily.  30 tablet  5  . nitroGLYCERIN (NITROSTAT) 0.4 MG SL tablet Place 1 tablet (0.4 mg total) under the tongue every 5 (five) minutes as needed. For chest pain  10 tablet  0  . omeprazole (PRILOSEC) 20 MG capsule Take 1 capsule (20 mg total) by mouth daily.  30 capsule  5  . potassium chloride SA (KLOR-CON M20) 20 MEQ tablet Take 1 tablet (20 mEq total) by mouth daily.  90 tablet  3  . pravastatin (PRAVACHOL) 40 MG tablet Take 1 tablet (40 mg total) by mouth daily.  30 tablet  5  . tiZANidine (ZANAFLEX) 2 MG tablet Take 1 tablet (2 mg total) by mouth every 6 (six) hours as needed (Back Muscle Spasm).  30 tablet  3  . traMADol (ULTRAM) 50 MG tablet Take 1 tablet (50 mg total) by mouth every 6 (six) hours as needed. As needed for pain.  90 tablet  11  . [DISCONTINUED] buPROPion (WELLBUTRIN XL) 150 MG 24 hr tablet Take 1 tablet (150 mg total) by mouth 2 (two) times daily.  60 tablet  5   No current facility-administered medications for this visit.    ALLERGIES:  is allergic to lisinopril.  REVIEW OF SYSTEMS:  The rest of the 14-point review of  system was negative.   Filed Vitals:   12/01/12 1322  BP: 125/68  Pulse: 85  Temp: 98 F (36.7 C)  Resp: 18   Wt Readings from Last 3 Encounters:  12/01/12 166 lb 6.4 oz (75.479 kg)  11/14/12 166 lb 11.2 oz (75.615 kg)  10/25/12 165 lb (74.844 kg)   ECOG Performance status: 1  PHYSICAL EXAMINATION:   General:  Thin-appearing woman, in no acute distress.  Eyes:  no scleral icterus.  ENT:  There were no oropharyngeal lesions.  Neck was without thyromegaly.  Lymphatics:  Negative cervical, supraclavicular or axillary adenopathy.  Respiratory: lungs were clear bilaterally without wheezing or crackles.  Cardiovascular:  Regular rate and rhythm, S1/S2, without murmur, rub or gallop.  There was no pedal edema.  GI:  abdomen was soft, flat, nontender, nondistended, without organomegaly.  Muscoloskeletal:  no spinal tenderness of palpation of vertebral spine.  Skin exam was without echymosis, petichae.  Neuro exam was nonfocal.Patient was able to get on and off exam table without assistance.  Gait was normal.  Patient was alert and oriented.  Attention was good.   Language was appropriate.  Mood was normal without depression.  Speech was not pressured.  Thought content was not tangential.     LABORATORY/RADIOLOGY DATA:  Lab Results  Component Value Date   WBC 5.6 12/01/2012   HGB 12.5 12/01/2012   HCT 39.5 12/01/2012   PLT 206 12/01/2012   GLUCOSE 94 12/01/2012   CHOL 198 03/21/2012   TRIG 104 03/21/2012   HDL 53 03/21/2012   LDLDIRECT 111* 02/27/2010   LDLCALC 124* 03/21/2012   ALKPHOS 122 12/01/2012   ALT 16 12/01/2012   AST 16 12/01/2012   NA 143 12/01/2012   K 3.3* 12/01/2012   CL 102 10/24/2012   CREATININE 0.8 12/01/2012   BUN 8.2 12/01/2012   CO2 28 12/01/2012   INR 1.10 10/19/2011    RADIOLOGY:  I personally reviewed the following CT chest and showed the images to the patient.   Findings:  Mediastinum: Right internal jugular Port-A-Cath with tip  terminating in the right atrium. A  well-defined 1.7 cm  intermediate to high attenuation (53 HU) nodule in the left lobe of  the thyroid gland is unchanged over numerous prior examinations,  favored to represent a benign lesion such as a colloid cyst. No  pathologically enlarged mediastinal or hilar lymph nodes. Esophagus  is unremarkable in appearance. There is atherosclerosis of the  thoracic aorta, the great vessels of the mediastinum and the  coronary arteries, including calcified atherosclerotic plaque in  the left anterior descending, left circumflex and right coronary  arteries.  Lungs/Pleura: Postoperative changes of left upper lobectomy are  again noted. Extensive peribronchovascular consolidative changes  and architectural distortion in the right perihilar region is again  noted. There are some patchy areas of peribronchovascular  interstitial prominence and ill-defined foci of airspace disease  throughout the periphery of the right lung, particularly in the  right apex, which are also similar to the prior examination. Small  area of scarring in the anterior aspect of the left lower lobe  inferiorly is unchanged. No definite new suspicious appearing  pulmonary nodules or masses are identified on today's examination.  Moderate right-sided pleural effusion is simple appearance, and  unchanged compared to the prior examination. No left pleural  effusion.  Upper Abdomen: Atherosclerosis. Otherwise, unremarkable.  Musculoskeletal: There are no aggressive appearing lytic or blastic  lesions noted in the visualized portions of the skeleton.  Postoperative changes of left-sided thoracotomy are again noted.  IMPRESSION:  1. Compared to the prior examination from 09/29/2012, the  appearance of the thorax is stable, as above. Given the lack of  interval change of the findings in the right lung, these are  favored to be related to prior radiation therapy. Moderate right-  sided pleural effusion is also unchanged. No  definite signs of new  metastatic disease to the thorax are identified on today's  examination.  2. Atherosclerosis, including three-vessel coronary artery disease.  Assessment for potential risk factor modification, dietary therapy  or pharmacologic therapy may be warranted, if clinically indicated.  3. Additional incidental findings, as above.    ASSESSMENT AND PLAN:   1. Recurrent Lung cancer now with brain med:  - Doing well on maintenance Alimta. There is no convincing sign of disease progression.  She has no severe side effects from Alimta.  I recommended to continue with Alimta until disease progression or inability to tolerate.  She agreed to continue.   2.  History of radiation-induced pneumonitis:  Symptoms resolved with Prednisone course.   3.  Bilateral pedal edema:  Leg elevation.  If it worsens in the future, we may consider resuming Lasix.   4.  Brain met:  S/p radiation.  There is no active disease currently.  5.  Back pain with questionable uptake on bone scan but negative on MRI did not show bone met.  This is due to DJD.   6. History of colon cancer: continue to be in remission.   She had a surveillance colonoscopy in October 2012 by Dr Christella Hartigan, which showed polyps. Per his recommendation, the next colonoscopy is due in Oct 2015 but sooner if she has symptoms.    7. Hypertension: She is on Bystolic, Catapres, HCTZ and per PCP.   8. Hypercholesterolemia: She is on pravastatin per PCP.   6. Follow up: in about 3 weeks.   7. CODE STATUS:  FULL CODE per previous discussion.    I informed Ms. Ardelean that I am leaving the practice this week.  [facility] will [facility] for another provider to assume her care when she returns.      Raywood Wailes T. Gaylyn Rong, M.D.

## 2012-12-05 ENCOUNTER — Other Ambulatory Visit: Payer: Self-pay | Admitting: *Deleted

## 2012-12-05 ENCOUNTER — Other Ambulatory Visit: Payer: Medicare Other

## 2012-12-05 ENCOUNTER — Other Ambulatory Visit: Payer: Self-pay | Admitting: Oncology

## 2012-12-05 ENCOUNTER — Ambulatory Visit (HOSPITAL_BASED_OUTPATIENT_CLINIC_OR_DEPARTMENT_OTHER): Payer: Medicare Other

## 2012-12-05 DIAGNOSIS — C3410 Malignant neoplasm of upper lobe, unspecified bronchus or lung: Secondary | ICD-10-CM

## 2012-12-05 DIAGNOSIS — Z5111 Encounter for antineoplastic chemotherapy: Secondary | ICD-10-CM

## 2012-12-05 DIAGNOSIS — C7949 Secondary malignant neoplasm of other parts of nervous system: Secondary | ICD-10-CM

## 2012-12-05 MED ORDER — ONDANSETRON 8 MG/50ML IVPB (CHCC)
8.0000 mg | Freq: Once | INTRAVENOUS | Status: AC
  Administered 2012-12-05: 8 mg via INTRAVENOUS

## 2012-12-05 MED ORDER — DEXAMETHASONE SODIUM PHOSPHATE 10 MG/ML IJ SOLN
10.0000 mg | Freq: Once | INTRAMUSCULAR | Status: AC
  Administered 2012-12-05: 10 mg via INTRAVENOUS

## 2012-12-05 MED ORDER — SODIUM CHLORIDE 0.9 % IJ SOLN
10.0000 mL | INTRAMUSCULAR | Status: DC | PRN
  Administered 2012-12-05: 10 mL
  Filled 2012-12-05: qty 10

## 2012-12-05 MED ORDER — HEPARIN SOD (PORK) LOCK FLUSH 100 UNIT/ML IV SOLN
500.0000 [IU] | Freq: Once | INTRAVENOUS | Status: AC | PRN
  Administered 2012-12-05: 500 [IU]
  Filled 2012-12-05: qty 5

## 2012-12-05 MED ORDER — SODIUM CHLORIDE 0.9 % IV SOLN
Freq: Once | INTRAVENOUS | Status: AC
  Administered 2012-12-05: 09:00:00 via INTRAVENOUS

## 2012-12-05 MED ORDER — SODIUM CHLORIDE 0.9 % IV SOLN
500.0000 mg/m2 | Freq: Once | INTRAVENOUS | Status: AC
  Administered 2012-12-05: 925 mg via INTRAVENOUS
  Filled 2012-12-05: qty 37

## 2012-12-05 MED ORDER — CYANOCOBALAMIN 1000 MCG/ML IJ SOLN: 1000.0000 ug | Freq: Once | INTRAMUSCULAR | Status: DC

## 2012-12-05 NOTE — Patient Instructions (Addendum)
[facility] Discharge Instructions for Patients Receiving Chemotherapy  Today you received the following chemotherapy agents ALIMTA  To help prevent nausea and vomiting after your treatment, we encourage you to take your nausea medication as per Dr. Gaylyn Rong and Clenton Pare NP   If you develop nausea and vomiting that is not controlled by your nausea medication, call the clinic.   BELOW ARE SYMPTOMS THAT SHOULD BE REPORTED IMMEDIATELY:  *FEVER GREATER THAN 100.5 F  *CHILLS WITH OR WITHOUT FEVER  NAUSEA AND VOMITING THAT IS NOT CONTROLLED WITH YOUR NAUSEA MEDICATION  *UNUSUAL SHORTNESS OF BREATH  *UNUSUAL BRUISING OR BLEEDING  TENDERNESS IN MOUTH AND THROAT WITH OR WITHOUT PRESENCE OF ULCERS  *URINARY PROBLEMS  *BOWEL PROBLEMS  UNUSUAL RASH Items with * indicate a potential emergency and should be followed up as soon as possible.  Feel free to call the clinic should you have any questions or concerns. The clinic phone number is [phone].

## 2012-12-05 NOTE — Progress Notes (Signed)
Message from Frankford Rn per Jeanne Ivan to treat with Lab from 09/01/2012 today. Orders signed by Dr. Clelia Croft. HL

## 2012-12-06 ENCOUNTER — Other Ambulatory Visit: Payer: Self-pay

## 2012-12-08 ENCOUNTER — Other Ambulatory Visit (HOSPITAL_COMMUNITY): Payer: Self-pay

## 2012-12-08 ENCOUNTER — Other Ambulatory Visit: Payer: Self-pay | Admitting: Radiation Therapy

## 2012-12-08 DIAGNOSIS — C7949 Secondary malignant neoplasm of other parts of nervous system: Secondary | ICD-10-CM

## 2012-12-12 ENCOUNTER — Telehealth: Payer: Self-pay | Admitting: *Deleted

## 2012-12-12 NOTE — Telephone Encounter (Signed)
Pt called this morning to report some increase in fatigue more than usual.  She has to rest after doing small tasks.  Denies any sob, fevers or signs of infection.  Notified Clenton Pare and she instructs for pt to call for any fevers,  Rest, good nutrition and hydration.   Discussed w/ pt likely fatigue side effect from chemotherapy and reviewed frequent rests, short tasks, good nutrition and hydration.  Call for any changes in condition, including fever or sob.  She verbalized understanding.

## 2012-12-26 ENCOUNTER — Ambulatory Visit (HOSPITAL_BASED_OUTPATIENT_CLINIC_OR_DEPARTMENT_OTHER): Payer: Medicare Other

## 2012-12-26 ENCOUNTER — Telehealth: Payer: Self-pay | Admitting: Oncology

## 2012-12-26 ENCOUNTER — Other Ambulatory Visit: Payer: Self-pay | Admitting: Hematology and Oncology

## 2012-12-26 ENCOUNTER — Encounter: Payer: Self-pay | Admitting: Oncology

## 2012-12-26 ENCOUNTER — Ambulatory Visit (HOSPITAL_BASED_OUTPATIENT_CLINIC_OR_DEPARTMENT_OTHER): Payer: Medicare Other | Admitting: Oncology

## 2012-12-26 ENCOUNTER — Telehealth: Payer: Self-pay | Admitting: *Deleted

## 2012-12-26 ENCOUNTER — Other Ambulatory Visit (HOSPITAL_BASED_OUTPATIENT_CLINIC_OR_DEPARTMENT_OTHER): Payer: Medicare Other | Admitting: Lab

## 2012-12-26 DIAGNOSIS — Z85038 Personal history of other malignant neoplasm of large intestine: Secondary | ICD-10-CM

## 2012-12-26 DIAGNOSIS — C7949 Secondary malignant neoplasm of other parts of nervous system: Secondary | ICD-10-CM

## 2012-12-26 DIAGNOSIS — C7931 Secondary malignant neoplasm of brain: Secondary | ICD-10-CM

## 2012-12-26 DIAGNOSIS — R609 Edema, unspecified: Secondary | ICD-10-CM

## 2012-12-26 DIAGNOSIS — C3410 Malignant neoplasm of upper lobe, unspecified bronchus or lung: Secondary | ICD-10-CM

## 2012-12-26 DIAGNOSIS — Z5111 Encounter for antineoplastic chemotherapy: Secondary | ICD-10-CM

## 2012-12-26 DIAGNOSIS — M549 Dorsalgia, unspecified: Secondary | ICD-10-CM

## 2012-12-26 LAB — CBC WITH DIFFERENTIAL/PLATELET
BASO%: 0.6 % (ref 0.0–2.0)
Eosinophils Absolute: 0.1 10*3/uL (ref 0.0–0.5)
LYMPH%: 14.7 % (ref 14.0–49.7)
MCHC: 32.2 g/dL (ref 31.5–36.0)
MONO#: 0.8 10*3/uL (ref 0.1–0.9)
NEUT#: 4.3 10*3/uL (ref 1.5–6.5)
RBC: 4.87 10*6/uL (ref 3.70–5.45)
RDW: 17.3 % — ABNORMAL HIGH (ref 11.2–14.5)
WBC: 6.2 10*3/uL (ref 3.9–10.3)

## 2012-12-26 LAB — COMPREHENSIVE METABOLIC PANEL (CC13)
ALT: 8 U/L (ref 0–55)
Albumin: 2.8 g/dL — ABNORMAL LOW (ref 3.5–5.0)
Alkaline Phosphatase: 106 U/L (ref 40–150)
Potassium: 3.7 mEq/L (ref 3.5–5.1)
Sodium: 142 mEq/L (ref 136–145)
Total Bilirubin: 0.36 mg/dL (ref 0.20–1.20)
Total Protein: 6.5 g/dL (ref 6.4–8.3)

## 2012-12-26 MED ORDER — CYANOCOBALAMIN 1000 MCG/ML IJ SOLN: 1000.0000 ug | Freq: Once | INTRAMUSCULAR | Status: DC

## 2012-12-26 MED ORDER — HEPARIN SOD (PORK) LOCK FLUSH 100 UNIT/ML IV SOLN
500.0000 [IU] | Freq: Once | INTRAVENOUS | Status: AC | PRN
  Administered 2012-12-26: 500 [IU]
  Filled 2012-12-26: qty 5

## 2012-12-26 MED ORDER — SODIUM CHLORIDE 0.9 % IJ SOLN
10.0000 mL | INTRAMUSCULAR | Status: DC | PRN
  Administered 2012-12-26: 10 mL
  Filled 2012-12-26: qty 10

## 2012-12-26 MED ORDER — DEXAMETHASONE SODIUM PHOSPHATE 10 MG/ML IJ SOLN
10.0000 mg | Freq: Once | INTRAMUSCULAR | Status: AC
  Administered 2012-12-26: 10 mg via INTRAVENOUS

## 2012-12-26 MED ORDER — ONDANSETRON 8 MG/50ML IVPB (CHCC)
8.0000 mg | Freq: Once | INTRAVENOUS | Status: AC
  Administered 2012-12-26: 8 mg via INTRAVENOUS

## 2012-12-26 MED ORDER — SODIUM CHLORIDE 0.9 % IV SOLN
500.0000 mg/m2 | Freq: Once | INTRAVENOUS | Status: AC
  Administered 2012-12-26: 925 mg via INTRAVENOUS
  Filled 2012-12-26: qty 37

## 2012-12-26 MED ORDER — SODIUM CHLORIDE 0.9 % IV SOLN
Freq: Once | INTRAVENOUS | Status: AC
  Administered 2012-12-26: 11:00:00 via INTRAVENOUS

## 2012-12-26 NOTE — Progress Notes (Signed)
[facility], [facility]  Telephone:(336) [phone] Fax:(336) [phone]   OFFICE PROGRESS NOTE   Cc:  Rodman Pickle, MD  PAST DIAGNOSIS AND PAST THERAPY:  1. 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 Her post resection surveillance colonoscopy onOctober 14, 2009, by Dr. Christella Hartigan was negative. 2. pT1a, pN0 well differentiated adenocarcinoma of lung; s/p left upper lobectomy on 06/16/2010. 3. 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  4. 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  She underwent stereotactic radiosurgery.  The left occipital 15mm target was treated using 4 Dynamic Conformal Arcs to a prescription dose of 20 Gy in 1 fraction. 6 MV photons, flattening filter free, were used. The right insula 4mm target was treated using 3 Circular Arcs to a prescription dose of 20 Gy in 1 fraction.  6 MV photons, flattening filter free, were used  CURRENT THERAPY: started maintenance chemo Alimta q3 weeks on 03/07/2012.    INTERVAL HISTORY: Lauren [name] 62 y.o. female returns for regular follow up with by herself.  She has mild fatigue; however, she is still independent of all activities of daily living. She denied side effects of chemo.  She denied fever, mucositis, nausea/vomiting, SOB, chest pain, abd pain, bleeding symptoms.  She has stable bilateral pedal edema which improved with leg elevation. The rest of the 14-point review of system was negative.    Past Medical History  Diagnosis Date  . Depression   . Hyperlipidemia   . Hypertension   . Lung nodule     FNA ordered for 04/02/10 by HA>pos Ca  . Chronic folliculitis     of groin  . Fibrocystic breast changes   .  Family history of trichomonal vaginitis 05/2005  . Postmenopausal   . Depressive disorder   . Hypercholesterolemia   . GERD (gastroesophageal reflux disease)   . Cerebral aneurysm   . Back pain   . Shortness of breath   . COPD (chronic obstructive pulmonary disease)   . Coronary artery disease   . Arthritis   . Weight loss 10/22/2011  . Status post radiation therapy 11/16/11 - 12/29/11    Right Lung and Mediastinum: 60 Gy  . Status post chemotherapy comp. 12/29/11    Carboplatin/Taxol  . Colon cancer 11/08  . Lung cancer 06/16/10    PET scan 04/28/2010; primary: increase in size 02/2010 / Well Differentiated Adenocarcinoma of the lung   . Brain metastases 02/15/12  . S/P radiation therapy 03/01/12    SRS: 1 fraction / 20 Gray each to the Left Occipital Region and to the Right Insular Metastases  . On antineoplastic chemotherapy started 02/2012    Alimta    Past Surgical History  Procedure Laterality Date  . Colectomy  03/22/07    Stage 1 pT2 N0, M0 Adenocarcinoma of the sigmoid  colon  . Tubal ligation    . Lung lobectomy  06/16/10    Left Upper Lobectomy  . Hernia repair    . Breast surgery      Bil lumpectomy  . Back surgery    . Mediastinoscopy  10/19/2011    Procedure: MEDIASTINOSCOPY;  Surgeon: Alleen Borne, MD;  Location: [facility] OR;  Service: Thoracic;  Laterality: N/A;  . Cardiac cath x3  Current Outpatient Prescriptions  Medication Sig Dispense Refill  . albuterol (PROVENTIL HFA;VENTOLIN HFA) 108 (90 BASE) MCG/ACT inhaler Inhale 2 puffs into the lungs every 6 (six) hours as needed. For shortness of breath  2 Inhaler  11  . aspirin 81 MG EC tablet Take 81 mg by mouth every morning.       . cloNIDine (CATAPRES) 0.1 MG tablet Take 1 tablet (0.1 mg total) by mouth 2 (two) times daily.  60 tablet  5  . diclofenac sodium (VOLTAREN) 1 % GEL Apply 2 g topically 4 (four) times daily. As needed for pain  100 g  11  . folic acid (FOLVITE) 1 MG tablet TAKE 1 TABLET BY MOUTH ONCE  DAILY  30 tablet  1  . hydrochlorothiazide (HYDRODIURIL) 25 MG tablet Take 1 tablet (25 mg total) by mouth daily.  30 tablet  5  . lidocaine-prilocaine (EMLA) cream Apply topically as needed. Apply to porta cath site one hour prior to needle stick.  30 g  2  . nebivolol (BYSTOLIC) 10 MG tablet Take 1 tablet (10 mg total) by mouth daily.  30 tablet  5  . nitroGLYCERIN (NITROSTAT) 0.4 MG SL tablet Place 1 tablet (0.4 mg total) under the tongue every 5 (five) minutes as needed. For chest pain  10 tablet  0  . omeprazole (PRILOSEC) 20 MG capsule Take 1 capsule (20 mg total) by mouth daily.  30 capsule  5  . potassium chloride SA (KLOR-CON M20) 20 MEQ tablet Take 1 tablet (20 mEq total) by mouth daily.  90 tablet  3  . pravastatin (PRAVACHOL) 40 MG tablet Take 1 tablet (40 mg total) by mouth daily.  30 tablet  5  . tiZANidine (ZANAFLEX) 2 MG tablet Take 1 tablet (2 mg total) by mouth every 6 (six) hours as needed (Back Muscle Spasm).  30 tablet  3  . traMADol (ULTRAM) 50 MG tablet Take 1 tablet (50 mg total) by mouth every 6 (six) hours as needed. As needed for pain.  90 tablet  11  . [DISCONTINUED] buPROPion (WELLBUTRIN XL) 150 MG 24 hr tablet Take 1 tablet (150 mg total) by mouth 2 (two) times daily.  60 tablet  5   No current facility-administered medications for this visit.   Facility-Administered Medications Ordered in Other Visits  Medication Dose Route Frequency Provider Last Rate Last Dose  . sodium chloride 0.9 % injection 10 mL  10 mL Intracatheter PRN Myrtis Ser, NP   10 mL at 12/26/12 1203    ALLERGIES:  is allergic to lisinopril.  REVIEW OF SYSTEMS:  The rest of the 14-point review of system was negative.   Filed Vitals:   12/26/12 0903  BP: 132/76  Pulse: 87  Temp: 97.6 F (36.4 C)  Resp: 18   Wt Readings from Last 3 Encounters:  12/26/12 165 lb 11.2 oz (75.161 kg)  12/01/12 166 lb 6.4 oz (75.479 kg)  11/14/12 166 lb 11.2 oz (75.615 kg)   ECOG Performance status:  1  PHYSICAL EXAMINATION:   General:  Thin-appearing woman, in no acute distress.  Eyes:  no scleral icterus.  ENT:  There were no oropharyngeal lesions.  Neck was without thyromegaly.  Lymphatics:  Negative cervical, supraclavicular or axillary adenopathy.  Respiratory: lungs were clear bilaterally without wheezing or crackles.  Cardiovascular:  Regular rate and rhythm, S1/S2, without murmur, rub or gallop.  There was no pedal edema.  GI:  abdomen was soft, flat, nontender, nondistended, without organomegaly.  Muscoloskeletal:  no spinal tenderness of palpation of vertebral spine.  Skin exam was without echymosis, petichae.  Neuro exam was nonfocal.Patient was able to get on and off exam table without assistance.  Gait was normal.  Patient was alert and oriented.  Attention was good.   Language was appropriate.  Mood was normal without depression.  Speech was not pressured.  Thought content was not tangential.     LABORATORY/RADIOLOGY DATA:  Lab Results  Component Value Date   WBC 6.2 12/26/2012   HGB 12.7 12/26/2012   HCT 39.4 12/26/2012   PLT 291 12/26/2012   GLUCOSE 106 12/26/2012   CHOL 198 03/21/2012   TRIG 104 03/21/2012   HDL 53 03/21/2012   LDLDIRECT 111* 02/27/2010   LDLCALC 124* 03/21/2012   ALKPHOS 106 12/26/2012   ALT 8 12/26/2012   AST 13 12/26/2012   NA 142 12/26/2012   K 3.7 12/26/2012   CL 102 10/24/2012   CREATININE 0.8 12/26/2012   BUN 11.4 12/26/2012   CO2 27 12/26/2012   INR 1.10 10/19/2011     ASSESSMENT AND PLAN:   1. Recurrent Lung cancer now with brain med:  - Doing well on maintenance Alimta. There is no convincing sign of disease progression on her last CT scan from 11/30/12.  She has no severe side effects from Alimta.  I recommended to continue with Alimta until disease progression or inability to tolerate.  She agreed to continue.   2.  History of radiation-induced pneumonitis:  Symptoms resolved with Prednisone course.   3.  Bilateral pedal edema:  Leg elevation.  If  it worsens in the future, we may consider resuming Lasix.   4.  Brain met:  S/p radiation.  There is no active disease currently.  5.  Back pain with questionable uptake on bone scan but negative on MRI did not show bone met.  This is due to DJD.   6. History of colon cancer: continue to be in remission.   She had a surveillance colonoscopy in October 2012 by Dr Christella Hartigan, which showed polyps. Per his recommendation, the next colonoscopy is due in Oct 2015 but sooner if she has symptoms.    7. Hypertension: She is on Bystolic, Catapres, HCTZ and per PCP.   8. Hypercholesterolemia: She is on pravastatin per PCP.   6. Follow up: in about 3 weeks.   7. CODE STATUS:  FULL CODE per previous discussion.

## 2012-12-26 NOTE — Telephone Encounter (Signed)
gv and printed appt sched and avs for pt...email3ed MW to add tx.

## 2012-12-26 NOTE — Patient Instructions (Signed)
[facility] Discharge Instructions for Patients Receiving Chemotherapy  Today you received the following chemotherapy agents alimta  To help prevent nausea and vomiting after your treatment, we encourage you to take your nausea medication as needed.   If you develop nausea and vomiting that is not controlled by your nausea medication, call the clinic.   BELOW ARE SYMPTOMS THAT SHOULD BE REPORTED IMMEDIATELY:  *FEVER GREATER THAN 100.5 F  *CHILLS WITH OR WITHOUT FEVER  NAUSEA AND VOMITING THAT IS NOT CONTROLLED WITH YOUR NAUSEA MEDICATION  *UNUSUAL SHORTNESS OF BREATH  *UNUSUAL BRUISING OR BLEEDING  TENDERNESS IN MOUTH AND THROAT WITH OR WITHOUT PRESENCE OF ULCERS  *URINARY PROBLEMS  *BOWEL PROBLEMS  UNUSUAL RASH Items with * indicate a potential emergency and should be followed up as soon as possible.  Feel free to call the clinic you have any questions or concerns. The clinic phone number is [phone].    

## 2012-12-26 NOTE — Telephone Encounter (Signed)
Per staff message and POF I have scheduled appts.  JMW  

## 2012-12-28 ENCOUNTER — Encounter: Payer: Self-pay | Admitting: Hematology and Oncology

## 2013-01-12 ENCOUNTER — Telehealth: Payer: Self-pay | Admitting: *Deleted

## 2013-01-12 NOTE — Telephone Encounter (Signed)
Per staff message I have adjusted   9/2 appt 

## 2013-01-12 NOTE — Telephone Encounter (Signed)
sw pt asked it she could come in early on 01/17/13. gv appt time for labs@11 :45am, ov @ 12:30pm,and tx to follow. Pt is aware that i emailed MW to see if her tx could be adjusted...td

## 2013-01-17 ENCOUNTER — Other Ambulatory Visit: Payer: Self-pay | Admitting: Lab

## 2013-01-17 ENCOUNTER — Ambulatory Visit (HOSPITAL_BASED_OUTPATIENT_CLINIC_OR_DEPARTMENT_OTHER): Payer: Medicare Other

## 2013-01-17 ENCOUNTER — Ambulatory Visit (HOSPITAL_BASED_OUTPATIENT_CLINIC_OR_DEPARTMENT_OTHER): Payer: Medicare Other | Admitting: Hematology and Oncology

## 2013-01-17 ENCOUNTER — Ambulatory Visit: Payer: Self-pay

## 2013-01-17 ENCOUNTER — Other Ambulatory Visit (HOSPITAL_BASED_OUTPATIENT_CLINIC_OR_DEPARTMENT_OTHER): Payer: Medicare Other | Admitting: Lab

## 2013-01-17 VITALS — BP 119/73 | HR 82 | Temp 97.7°F | Resp 18 | Ht 64.0 in | Wt 160.2 lb

## 2013-01-17 DIAGNOSIS — H538 Other visual disturbances: Secondary | ICD-10-CM

## 2013-01-17 DIAGNOSIS — C7931 Secondary malignant neoplasm of brain: Secondary | ICD-10-CM

## 2013-01-17 DIAGNOSIS — C189 Malignant neoplasm of colon, unspecified: Secondary | ICD-10-CM

## 2013-01-17 DIAGNOSIS — Z5111 Encounter for antineoplastic chemotherapy: Secondary | ICD-10-CM

## 2013-01-17 DIAGNOSIS — R05 Cough: Secondary | ICD-10-CM

## 2013-01-17 DIAGNOSIS — C3410 Malignant neoplasm of upper lobe, unspecified bronchus or lung: Secondary | ICD-10-CM

## 2013-01-17 DIAGNOSIS — C187 Malignant neoplasm of sigmoid colon: Secondary | ICD-10-CM

## 2013-01-17 LAB — CBC WITH DIFFERENTIAL/PLATELET
Basophils Absolute: 0 10*3/uL (ref 0.0–0.1)
EOS%: 2 % (ref 0.0–7.0)
HGB: 12.8 g/dL (ref 11.6–15.9)
LYMPH%: 17.7 % (ref 14.0–49.7)
MCH: 26.2 pg (ref 25.1–34.0)
MCV: 80.8 fL (ref 79.5–101.0)
MONO%: 11.4 % (ref 0.0–14.0)
NEUT%: 68.6 % (ref 38.4–76.8)
Platelets: 305 10*3/uL (ref 145–400)
RDW: 17 % — ABNORMAL HIGH (ref 11.2–14.5)

## 2013-01-17 LAB — COMPREHENSIVE METABOLIC PANEL (CC13)
Alkaline Phosphatase: 109 U/L (ref 40–150)
BUN: 6.9 mg/dL — ABNORMAL LOW (ref 7.0–26.0)
Creatinine: 0.7 mg/dL (ref 0.6–1.1)
Glucose: 105 mg/dl (ref 70–140)
Sodium: 145 mEq/L (ref 136–145)
Total Bilirubin: 0.5 mg/dL (ref 0.20–1.20)

## 2013-01-17 MED ORDER — HEPARIN SOD (PORK) LOCK FLUSH 100 UNIT/ML IV SOLN
500.0000 [IU] | Freq: Once | INTRAVENOUS | Status: AC | PRN
  Administered 2013-01-17: 500 [IU]
  Filled 2013-01-17: qty 5

## 2013-01-17 MED ORDER — DEXAMETHASONE SODIUM PHOSPHATE 10 MG/ML IJ SOLN
10.0000 mg | Freq: Once | INTRAMUSCULAR | Status: AC
  Administered 2013-01-17: 10 mg via INTRAVENOUS

## 2013-01-17 MED ORDER — CYANOCOBALAMIN 1000 MCG/ML IJ SOLN
1000.0000 ug | Freq: Once | INTRAMUSCULAR | Status: AC
  Administered 2013-01-17: 1000 ug via INTRAMUSCULAR

## 2013-01-17 MED ORDER — ONDANSETRON 8 MG/50ML IVPB (CHCC)
8.0000 mg | Freq: Once | INTRAVENOUS | Status: AC
  Administered 2013-01-17: 8 mg via INTRAVENOUS

## 2013-01-17 MED ORDER — SODIUM CHLORIDE 0.9 % IV SOLN
500.0000 mg/m2 | Freq: Once | INTRAVENOUS | Status: AC
  Administered 2013-01-17: 925 mg via INTRAVENOUS
  Filled 2013-01-17: qty 37

## 2013-01-17 MED ORDER — SODIUM CHLORIDE 0.9 % IV SOLN
Freq: Once | INTRAVENOUS | Status: AC
  Administered 2013-01-17: 14:00:00 via INTRAVENOUS

## 2013-01-17 MED ORDER — SODIUM CHLORIDE 0.9 % IJ SOLN
10.0000 mL | INTRAMUSCULAR | Status: DC | PRN
  Administered 2013-01-17: 10 mL
  Filled 2013-01-17: qty 10

## 2013-01-17 NOTE — Patient Instructions (Addendum)
[facility] Discharge Instructions for Patients Receiving Chemotherapy  Today you received the following chemotherapy agents :  Alimta.  To help prevent nausea and vomiting after your treatment, we encourage you to take your nausea medication as instructed by your physician.   If you develop nausea and vomiting that is not controlled by your nausea medication, call the clinic.   BELOW ARE SYMPTOMS THAT SHOULD BE REPORTED IMMEDIATELY:  *FEVER GREATER THAN 100.5 F  *CHILLS WITH OR WITHOUT FEVER  NAUSEA AND VOMITING THAT IS NOT CONTROLLED WITH YOUR NAUSEA MEDICATION  *UNUSUAL SHORTNESS OF BREATH  *UNUSUAL BRUISING OR BLEEDING  TENDERNESS IN MOUTH AND THROAT WITH OR WITHOUT PRESENCE OF ULCERS  *URINARY PROBLEMS  *BOWEL PROBLEMS  UNUSUAL RASH Items with * indicate a potential emergency and should be followed up as soon as possible.  Feel free to call the clinic you have any questions or concerns. The clinic phone number is [phone].    

## 2013-01-18 NOTE — Progress Notes (Signed)
ID: Catalina Gravel OB: 04-11-1951  MR#: [id number]  WUJ#:[id number]  [facility]  Telephone:(336) [phone] [phone]   OFFICE PROGRESS NOTE  PCP: Rodman Pickle, MD   PAST DIAGNOSIS AND PAST THERAPY:  1. 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 Her post resection surveillance colonoscopy on February 29, 2008, by Dr. Christella Hartigan was negative. 2. pT1a, pN0 well differentiated adenocarcinoma of lung; s/p left upper lobectomy on 06/16/2010. - Recurrent/metastatic nonsmall cell lung cancer adenocarcinoma in the right lung and received concurrent chemoradiation with daily XRT and weekly Carboplatin/Taxol finished on 12/29/2011.  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 She underwent stereotactic radiosurgery. The left occipital 15 mm target was treated using 4 Dynamic Conformal Arcs to a prescription dose of 20 Gy in 1 fraction. 6 MV photons, flattening filter free, were used. The right insula 4mm target was treated using 3 Circular Arcs to a prescription dose of 20 Gy in 1 fraction. 6 MV photons, flattening filter free, were used  CURRENT THERAPY: Maintenance Alimta q3 weeks from 03/07/2012.   INTERVAL HISTORY:  MAYMUNAH STEGEMANN 62 y.o. female who returns for regular follow up visit.  Her appetite is so-so but weight is stable.  She reported dyspnea on exertion when she walks for 30 minutes 3 times a week and dry cough at night. Patient complains on constipation and blurry vision. Occasionally she has cramps in her legs, He skin is darker when she started chemotherapy. The patient denied fever, chills, night sweats. She denied headaches, double vision, nasal congestion, nasal discharge, hearing problems, odynophagia or dysphagia. No chest pain, palpitations,  abdominal pain, nausea, vomiting, diarrhea, hematochezia. The patient denied  dysuria, nocturia, polyuria, hematuria, myalgia, numbness, tingling, psychiatric problems.  Review of Systems  Constitutional: Negative for fever, chills, weight loss, malaise/fatigue and diaphoresis.  HENT: Negative for hearing loss, ear pain, nosebleeds, congestion, sore throat, neck pain and tinnitus.   Eyes: Positive for blurred vision. Negative for double vision, photophobia, pain, discharge and redness.  Respiratory: Positive for cough and shortness of breath. Negative for hemoptysis, sputum production, wheezing and stridor.   Cardiovascular: Negative for chest pain, palpitations, orthopnea, claudication, leg swelling and PND.  Gastrointestinal: Positive for constipation. Negative for heartburn, nausea, vomiting, abdominal pain, diarrhea, blood in stool and melena.  Genitourinary: Negative for dysuria, urgency, frequency, hematuria and flank pain.  Musculoskeletal: Negative for myalgias, back pain and joint pain.  Skin: Negative for itching and rash.  Neurological: Negative for dizziness, tingling, tremors, sensory change, speech change, focal weakness, seizures, loss of consciousness, weakness and headaches.  Endo/Heme/Allergies: Does not bruise/bleed easily.  Psychiatric/Behavioral: Negative.    PAST MEDICAL HISTORY: Past Medical History  Diagnosis Date  . Depression   . Hyperlipidemia   . Hypertension   . Lung nodule     FNA ordered for 04/02/10 by HA>pos Ca  . Chronic folliculitis     of groin  . Fibrocystic breast changes   . Family history of trichomonal vaginitis 05/2005  . Postmenopausal   . Depressive disorder   . Hypercholesterolemia   . GERD (gastroesophageal reflux disease)   . Cerebral aneurysm   . Back pain   . Shortness of breath   . COPD (chronic obstructive pulmonary disease)   . Coronary artery disease   . Arthritis   . Weight loss 10/22/2011  . Status post radiation therapy 11/16/11 -  12/29/11    Right Lung and Mediastinum: 60 Gy  . Status post chemotherapy  comp. 12/29/11    Carboplatin/Taxol  . Colon cancer 11/08  . Lung cancer 06/16/10    PET scan 04/28/2010; primary: increase in size 02/2010 / Well Differentiated Adenocarcinoma of the lung   . Brain metastases 02/15/12  . S/P radiation therapy 03/01/12    SRS: 1 fraction / 20 Gray each to the Left Occipital Region and to the Right Insular Metastases  . On antineoplastic chemotherapy started 02/2012    Alimta    PAST SURGICAL HISTORY: Past Surgical History  Procedure Laterality Date  . Colectomy  03/22/07    Stage 1 pT2 N0, M0 Adenocarcinoma of the sigmoid  colon  . Tubal ligation    . Lung lobectomy  06/16/10    Left Upper Lobectomy  . Hernia repair    . Breast surgery      Bil lumpectomy  . Back surgery    . Mediastinoscopy  10/19/2011    Procedure: MEDIASTINOSCOPY;  Surgeon: Alleen Borne, MD;  [facility] OR;  Service: Thoracic;  Laterality: N/A;  . Cardiac cath x3      FAMILY HISTORY Family History  Problem Relation Age of Onset  . Stomach cancer Maternal Aunt   . Breast cancer Cousin   . Cancer Sister     Lymphatic  . Osteoarthritis Father   . Gout Father   . Hypertension Father   . Heart disease Mother     pericarditis;   . Anesthesia problems Neg Hx    HEALTH MAINTENANCE: History  Substance Use Topics  . Smoking status: Current Every Day Smoker -- 1.00 packs/day    Types: Cigarettes  . Smokeless tobacco: Current User  . Alcohol Use: 0.0 oz/week     Comment: occasional    Allergies  Allergen Reactions  . Lisinopril     REACTION: cough    Current Outpatient Prescriptions  Medication Sig Dispense Refill  . albuterol (PROVENTIL HFA;VENTOLIN HFA) 108 (90 BASE) MCG/ACT inhaler Inhale 2 puffs into the lungs every 6 (six) hours as needed. For shortness of breath  2 Inhaler  11  . aspirin 81 MG EC tablet Take 81 mg by mouth every morning.       . cloNIDine (CATAPRES) 0.1 MG tablet Take 1 tablet (0.1 mg total) by mouth 2 (two) times daily.  60 tablet  5  .  diclofenac sodium (VOLTAREN) 1 % GEL Apply 2 g topically 4 (four) times daily. As needed for pain  100 g  11  . folic acid (FOLVITE) 1 MG tablet TAKE 1 TABLET BY MOUTH ONCE DAILY  30 tablet  1  . hydrochlorothiazide (HYDRODIURIL) 25 MG tablet Take 1 tablet (25 mg total) by mouth daily.  30 tablet  5  . lidocaine-prilocaine (EMLA) cream Apply topically as needed. Apply to porta cath site one hour prior to needle stick.  30 g  2  . nebivolol (BYSTOLIC) 10 MG tablet Take 1 tablet (10 mg total) by mouth daily.  30 tablet  5  . nitroGLYCERIN (NITROSTAT) 0.4 MG SL tablet Place 1 tablet (0.4 mg total) under the tongue every 5 (five) minutes as needed. For chest pain  10 tablet  0  . omeprazole (PRILOSEC) 20 MG capsule Take 1 capsule (20 mg total) by mouth daily.  30 capsule  5  . potassium chloride SA (KLOR-CON M20) 20 MEQ tablet Take 1 tablet (20 mEq total) by mouth daily.  90 tablet  3  . pravastatin (PRAVACHOL) 40 MG tablet Take 1 tablet (40 mg total) by mouth daily.  30 tablet  5  . tiZANidine (ZANAFLEX) 2 MG tablet Take 1 tablet (2 mg total) by mouth every 6 (six) hours as needed (Back Muscle Spasm).  30 tablet  3  . traMADol (ULTRAM) 50 MG tablet Take 1 tablet (50 mg total) by mouth every 6 (six) hours as needed. As needed for pain.  90 tablet  11  . [DISCONTINUED] buPROPion (WELLBUTRIN XL) 150 MG 24 hr tablet Take 1 tablet (150 mg total) by mouth 2 (two) times daily.  60 tablet  5   No current facility-administered medications for this visit.    OBJECTIVE: Filed Vitals:   01/17/13 1228  BP: 119/73  Pulse: 82  Temp: 97.7 F (36.5 C)  Resp: 18     Body mass index is 27.48 kg/(m^2).    ECOG FS: 1  PHYSICAL EXAMINATION:  HEENT: Sclerae anicteric.  Conjunctivae were pink. Pupils round and reactive bilaterally. Oral mucosa is moist without ulceration or thrush. No occipital, submandibular, cervical, supraclavicular or axillar adenopathy. Lungs: clear to auscultation without wheezes. No rales  or rhonchi. Heart: regular rate and rhythm. No murmur, gallop or rubs. Abdomen: soft, non tender. No guarding or rebound tenderness. Bowel sounds are present. No palpable hepatosplenomegaly. MSK: no focal spinal tenderness. Extremities: No clubbing or cyanosis.No calf tenderness to palpitation, no peripheral edema. The patient had grossly intact strength in upper and lower extremities. Skin exam was without ecchymosis, petechiae. Neuro: non-focal, alert and oriented to time, person and place, appropriate affect  LAB RESULTS:  CMP     Component Value Date/Time   NA 145 01/17/2013 1138   NA 139 02/15/2012 1159   NA 143 09/28/2011 0833   K 3.1* 01/17/2013 1138   K 3.1* 02/15/2012 1159   K 3.5 09/28/2011 0833   CL 102 10/24/2012 1001   CL 102 02/15/2012 1159   CL 98 09/28/2011 0833   CO2 27 01/17/2013 1138   CO2 26 02/15/2012 1159   CO2 30 09/28/2011 0833   GLUCOSE 105 01/17/2013 1138   GLUCOSE 111* 10/24/2012 1001   GLUCOSE 104* 02/15/2012 1159   GLUCOSE 112 09/28/2011 0833   BUN 6.9* 01/17/2013 1138   BUN 10 02/15/2012 1159   BUN 9 09/28/2011 0833   CREATININE 0.7 01/17/2013 1138   CREATININE 0.67 02/15/2012 1159   CREATININE 0.7 09/28/2011 0833   CALCIUM 9.1 01/17/2013 1138   CALCIUM 9.3 02/15/2012 1159   CALCIUM 8.7 09/28/2011 0833   PROT 6.5 01/17/2013 1138   PROT 7.2 02/15/2012 1159   PROT 7.2 09/28/2011 0833   ALBUMIN 3.0* 01/17/2013 1138   ALBUMIN 3.4* 02/15/2012 1159   AST 17 01/17/2013 1138   AST 12 02/15/2012 1159   AST 15 09/28/2011 0833   ALT 12 01/17/2013 1138   ALT 5 02/15/2012 1159   ALT 16 09/28/2011 0833   ALKPHOS 109 01/17/2013 1138   ALKPHOS 119* 02/15/2012 1159   ALKPHOS 99* 09/28/2011 0833   BILITOT 0.50 01/17/2013 1138   BILITOT 0.4 02/15/2012 1159   BILITOT 0.80 09/28/2011 0833   GFRNONAA >90 02/15/2012 1159   GFRAA >90 02/15/2012 1159    Lab Results  Component Value Date   WBC 5.9 01/17/2013   NEUTROABS 4.0 01/17/2013   HGB 12.8 01/17/2013   HCT 39.5 01/17/2013   MCV 80.8 01/17/2013   PLT 305  01/17/2013      Chemistry  Component Value Date/Time   NA 145 01/17/2013 1138   NA 139 02/15/2012 1159   NA 143 09/28/2011 0833   K 3.1* 01/17/2013 1138   K 3.1* 02/15/2012 1159   K 3.5 09/28/2011 0833   CL 102 10/24/2012 1001   CL 102 02/15/2012 1159   CL 98 09/28/2011 0833   CO2 27 01/17/2013 1138   CO2 26 02/15/2012 1159   CO2 30 09/28/2011 0833   BUN 6.9* 01/17/2013 1138   BUN 10 02/15/2012 1159   BUN 9 09/28/2011 0833   CREATININE 0.7 01/17/2013 1138   CREATININE 0.67 02/15/2012 1159   CREATININE 0.7 09/28/2011 0833      Component Value Date/Time   CALCIUM 9.1 01/17/2013 1138   CALCIUM 9.3 02/15/2012 1159   CALCIUM 8.7 09/28/2011 0833   ALKPHOS 109 01/17/2013 1138   ALKPHOS 119* 02/15/2012 1159   ALKPHOS 99* 09/28/2011 0833   AST 17 01/17/2013 1138   AST 12 02/15/2012 1159   AST 15 09/28/2011 0833   ALT 12 01/17/2013 1138   ALT 5 02/15/2012 1159   ALT 16 09/28/2011 0833   BILITOT 0.50 01/17/2013 1138   BILITOT 0.4 02/15/2012 1159   BILITOT 0.80 09/28/2011 0833     ASSESSMENT AND PLAN: 1. Recurrent Lung cancer with brain med:  S/p radiation.  - Doing well on maintenance Alimta. No disease progression on her last CT scan from 11/30/12. Continue  Alimta until disease progression or inability to tolerate. She agreed to continue.  2. History of colon cancer.  In remission. She had a surveillance colonoscopy in October 2012 by Dr Christella Hartigan, which showed polyps. Per his recommendation, the next colonoscopy is due in Oct 2015 but sooner if she has symptoms.  3. Follow up: in about 3 weeks.  4. CODE STATUS: FULL CODE   Myra Rude, MD   01/17/2013 2:49 PM

## 2013-01-22 ENCOUNTER — Telehealth: Payer: Self-pay | Admitting: Hematology and Oncology

## 2013-01-22 NOTE — Telephone Encounter (Signed)
S/w pt re appt for 9/22. Pt to get new schedule.

## 2013-01-31 ENCOUNTER — Other Ambulatory Visit: Payer: Self-pay | Admitting: Oncology

## 2013-01-31 NOTE — Telephone Encounter (Signed)
OK to refill X 6 per Dr. Bertis Ruddy.

## 2013-02-06 ENCOUNTER — Ambulatory Visit (HOSPITAL_BASED_OUTPATIENT_CLINIC_OR_DEPARTMENT_OTHER): Payer: Medicare Other | Admitting: Hematology and Oncology

## 2013-02-06 ENCOUNTER — Telehealth: Payer: Self-pay | Admitting: Hematology and Oncology

## 2013-02-06 ENCOUNTER — Ambulatory Visit (HOSPITAL_BASED_OUTPATIENT_CLINIC_OR_DEPARTMENT_OTHER): Payer: Medicare Other

## 2013-02-06 ENCOUNTER — Encounter: Payer: Self-pay | Admitting: Hematology and Oncology

## 2013-02-06 ENCOUNTER — Other Ambulatory Visit (HOSPITAL_BASED_OUTPATIENT_CLINIC_OR_DEPARTMENT_OTHER): Payer: Medicare Other

## 2013-02-06 VITALS — BP 117/68 | HR 76 | Temp 97.6°F | Resp 18 | Ht 64.0 in | Wt 159.1 lb

## 2013-02-06 DIAGNOSIS — R5383 Other fatigue: Secondary | ICD-10-CM

## 2013-02-06 DIAGNOSIS — C3410 Malignant neoplasm of upper lobe, unspecified bronchus or lung: Secondary | ICD-10-CM

## 2013-02-06 DIAGNOSIS — Z23 Encounter for immunization: Secondary | ICD-10-CM

## 2013-02-06 DIAGNOSIS — C3490 Malignant neoplasm of unspecified part of unspecified bronchus or lung: Secondary | ICD-10-CM

## 2013-02-06 DIAGNOSIS — R5381 Other malaise: Secondary | ICD-10-CM

## 2013-02-06 DIAGNOSIS — Z5111 Encounter for antineoplastic chemotherapy: Secondary | ICD-10-CM

## 2013-02-06 HISTORY — DX: Other fatigue: R53.83

## 2013-02-06 LAB — COMPREHENSIVE METABOLIC PANEL (CC13)
AST: 15 U/L (ref 5–34)
Albumin: 2.9 g/dL — ABNORMAL LOW (ref 3.5–5.0)
BUN: 10.3 mg/dL (ref 7.0–26.0)
CO2: 28 mEq/L (ref 22–29)
Calcium: 9.3 mg/dL (ref 8.4–10.4)
Chloride: 105 mEq/L (ref 98–109)
Glucose: 99 mg/dl (ref 70–140)
Potassium: 3.6 mEq/L (ref 3.5–5.1)
Total Protein: 6.8 g/dL (ref 6.4–8.3)

## 2013-02-06 LAB — CBC WITH DIFFERENTIAL/PLATELET
Basophils Absolute: 0 10*3/uL (ref 0.0–0.1)
Eosinophils Absolute: 0.1 10*3/uL (ref 0.0–0.5)
HCT: 37.3 % (ref 34.8–46.6)
HGB: 11.9 g/dL (ref 11.6–15.9)
MCH: 25.6 pg (ref 25.1–34.0)
MONO#: 0.7 10*3/uL (ref 0.1–0.9)
MONO%: 14.5 % — ABNORMAL HIGH (ref 0.0–14.0)
NEUT#: 3.3 10*3/uL (ref 1.5–6.5)
NEUT%: 67.5 % (ref 38.4–76.8)
WBC: 5 10*3/uL (ref 3.9–10.3)
lymph#: 0.8 10*3/uL — ABNORMAL LOW (ref 0.9–3.3)

## 2013-02-06 MED ORDER — ONDANSETRON 8 MG/NS 50 ML IVPB
INTRAVENOUS | Status: AC
  Filled 2013-02-06: qty 8

## 2013-02-06 MED ORDER — SODIUM CHLORIDE 0.9 % IJ SOLN
10.0000 mL | INTRAMUSCULAR | Status: DC | PRN
Start: 2013-02-06 — End: 2013-02-06
  Administered 2013-02-06: 10 mL
  Filled 2013-02-06: qty 10

## 2013-02-06 MED ORDER — HEPARIN SOD (PORK) LOCK FLUSH 100 UNIT/ML IV SOLN
500.0000 [IU] | Freq: Once | INTRAVENOUS | Status: AC | PRN
  Administered 2013-02-06: 500 [IU]
  Filled 2013-02-06: qty 5

## 2013-02-06 MED ORDER — INFLUENZA VAC SPLIT QUAD 0.5 ML IM SUSP
0.5000 mL | Freq: Once | INTRAMUSCULAR | Status: DC
  Administered 2013-02-06: 0.5 mL via INTRAMUSCULAR
  Filled 2013-02-06: qty 0.5

## 2013-02-06 MED ORDER — SODIUM CHLORIDE 0.9 % IV SOLN
Freq: Once | INTRAVENOUS | Status: AC
  Administered 2013-02-06: 14:00:00 via INTRAVENOUS

## 2013-02-06 MED ORDER — DEXAMETHASONE SODIUM PHOSPHATE 10 MG/ML IJ SOLN
10.0000 mg | Freq: Once | INTRAMUSCULAR | Status: AC
  Administered 2013-02-06: 10 mg via INTRAVENOUS

## 2013-02-06 MED ORDER — ONDANSETRON 8 MG/50ML IVPB (CHCC)
8.0000 mg | Freq: Once | INTRAVENOUS | Status: AC
  Administered 2013-02-06: 8 mg via INTRAVENOUS

## 2013-02-06 MED ORDER — SODIUM CHLORIDE 0.9 % IV SOLN
500.0000 mg/m2 | Freq: Once | INTRAVENOUS | Status: AC
  Administered 2013-02-06: 925 mg via INTRAVENOUS
  Filled 2013-02-06: qty 37

## 2013-02-06 MED ORDER — DEXAMETHASONE SODIUM PHOSPHATE 10 MG/ML IJ SOLN
INTRAMUSCULAR | Status: AC
  Filled 2013-02-06: qty 1

## 2013-02-06 NOTE — Patient Instructions (Addendum)
[facility] Discharge Instructions for Patients Receiving Chemotherapy  Today you received the following chemotherapy agents Alimta  To help prevent nausea and vomiting after your treatment, we encourage you to take your nausea medication as prescribed.   If you develop nausea and vomiting that is not controlled by your nausea medication, call the clinic.   BELOW ARE SYMPTOMS THAT SHOULD BE REPORTED IMMEDIATELY:  *FEVER GREATER THAN 100.5 F  *CHILLS WITH OR WITHOUT FEVER  NAUSEA AND VOMITING THAT IS NOT CONTROLLED WITH YOUR NAUSEA MEDICATION  *UNUSUAL SHORTNESS OF BREATH  *UNUSUAL BRUISING OR BLEEDING  TENDERNESS IN MOUTH AND THROAT WITH OR WITHOUT PRESENCE OF ULCERS  *URINARY PROBLEMS  *BOWEL PROBLEMS  UNUSUAL RASH Items with * indicate a potential emergency and should be followed up as soon as possible.  Feel free to call the clinic you have any questions or concerns. The clinic phone number is [phone].    

## 2013-02-06 NOTE — Telephone Encounter (Signed)
Gave pt appt for lab, Md and chemo for [facility] and October 2014

## 2013-02-06 NOTE — Progress Notes (Signed)
[facility] OFFICE PROGRESS NOTE  Rodman Pickle, MD [address] [place] [address] No chief complaint on file.   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 Her post resection surveillance colonoscopy onOctober 14, 2009, by Dr. Christella Hartigan was negative. 2. pT1a, pN0 well differentiated adenocarcinoma of lung; s/p left upper lobectomy on 06/16/2010. 3. 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  4. 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  She underwent stereotactic radiosurgery.  The left occipital 15mm target was treated using 4 Dynamic Conformal Arcs to a prescription dose of 20 Gy in 1 fraction. 6 MV photons, flattening filter free, were used. The right insula 4mm target was treated using 3 Circular Arcs to a prescription dose of 20 Gy in 1 fraction.  6 MV photons, flattening filter free, were used  CURRENT THERAPY: started maintenance chemo Alimta q3 weeks on 03/07/2012.   INTERVAL HISTORY: Lauren [name] 62 y.o. female returns for routine followup prior to her maintain his therapy. She has been on Alimta for almost a year. Most recently, she complained of intermittent headaches but no seizures no neurological deficit. An MRA been ordered along with MRI next month for further evaluation. She complained also of persistent fatigue that she does not need to take any naps. She does have chronic constipation and she only goes to the bathroom twice a week. Denies any nausea  or vomiting. Denies any chest pain shortness of breath or recent cough.  I have reviewed the past medical history, past surgical history, social history and family history with the patient and they are unchanged from previous note.  ALLERGIES:  is allergic to lisinopril.  MEDICATIONS: has a current medication list which includes the following prescription(s): albuterol, ashwagandha extract, aspirin, clonidine, diclofenac sodium, folic acid, hydrochlorothiazide, lidocaine-prilocaine, nebivolol, omeprazole, potassium chloride sa, pravastatin, tizanidine, tramadol, and nitroglycerin, and the following Facility-Administered Medications: influenza vac split quadrivalent pf.  REVIEW OF SYSTEMS:   Constitutional: Denies fevers, chills or abnormal weight loss Eyes: Denies blurriness of vision Ears, nose, mouth, throat, and face: Denies mucositis or sore throat Respiratory: Denies cough, dyspnea or wheezes Cardiovascular: Denies palpitation, chest discomfort or lower extremity swelling Gastrointestinal:  Denies nausea, heartburn or change in bowel habits Skin: Denies abnormal skin rashes Lymphatics: Denies new lymphadenopathy or easy bruising Neurological:Denies numbness, tingling or new weaknesses Behavioral/Psych: Mood is stable, no new changes  All other systems were reviewed with the patient and are negative.  PHYSICAL EXAMINATION: ECOG PERFORMANCE STATUS: 1 - Symptomatic but completely ambulatory  Filed Vitals:   02/06/13 1212  BP: 117/68  Pulse: 76  Temp: 97.6 F (36.4 C)  Resp: 18    GENERAL:alert, no distress and comfortable SKIN: skin color, texture, turgor are normal, no rashes or significant lesions EYES: normal, Conjunctiva are pink and non-injected, sclera clear OROPHARYNX:no exudate, no erythema and lips, buccal mucosa, and tongue normal  NECK: supple, thyroid normal size, non-tender, without nodularity LYMPH:  no palpable lymphadenopathy in the cervical, axillary or  inguinal LUNGS: clear to auscultation except on the right lung base this reduced breath  sounds and it is bowel on percussion with normal breathing effort HEART: regular rate & rhythm and no murmurs and no lower extremity edema ABDOMEN:abdomen soft, non-tender and normal bowel sounds Musculoskeletal:no cyanosis of digits and no clubbing  NEURO: alert & oriented x 3 with fluent speech, no focal motor/sensory deficits  LABORATORY DATA:  I have reviewed the data as listed    Component Value Date/Time   NA 145 01/17/2013 1138   NA 139 02/15/2012 1159   NA 143 09/28/2011 0833   K 3.1* 01/17/2013 1138   K 3.1* 02/15/2012 1159   K 3.5 09/28/2011 0833   CL 102 10/24/2012 1001   CL 102 02/15/2012 1159   CL 98 09/28/2011 0833   CO2 27 01/17/2013 1138   CO2 26 02/15/2012 1159   CO2 30 09/28/2011 0833   GLUCOSE 105 01/17/2013 1138   GLUCOSE 111* 10/24/2012 1001   GLUCOSE 104* 02/15/2012 1159   GLUCOSE 112 09/28/2011 0833   BUN 6.9* 01/17/2013 1138   BUN 10 02/15/2012 1159   BUN 9 09/28/2011 0833   CREATININE 0.7 01/17/2013 1138   CREATININE 0.67 02/15/2012 1159   CREATININE 0.7 09/28/2011 0833   CALCIUM 9.1 01/17/2013 1138   CALCIUM 9.3 02/15/2012 1159   CALCIUM 8.7 09/28/2011 0833   PROT 6.5 01/17/2013 1138   PROT 7.2 02/15/2012 1159   PROT 7.2 09/28/2011 0833   ALBUMIN 3.0* 01/17/2013 1138   ALBUMIN 3.4* 02/15/2012 1159   AST 17 01/17/2013 1138   AST 12 02/15/2012 1159   AST 15 09/28/2011 0833   ALT 12 01/17/2013 1138   ALT 5 02/15/2012 1159   ALT 16 09/28/2011 0833   ALKPHOS 109 01/17/2013 1138   ALKPHOS 119* 02/15/2012 1159   ALKPHOS 99* 09/28/2011 0833   BILITOT 0.50 01/17/2013 1138   BILITOT 0.4 02/15/2012 1159   BILITOT 0.80 09/28/2011 0833   GFRNONAA >90 02/15/2012 1159   GFRAA >90 02/15/2012 1159    I No results found for this basename: SPEP, UPEP,  kappa and lambda light chains    Lab Results  Component Value Date   WBC 5.0 02/06/2013   NEUTROABS 3.3 02/06/2013   HGB 11.9 02/06/2013   HCT 37.3 02/06/2013   MCV 80.4  02/06/2013   PLT 311 02/06/2013      Chemistry      Component Value Date/Time   NA 145 01/17/2013 1138   NA 139 02/15/2012 1159   NA 143 09/28/2011 0833   K 3.1* 01/17/2013 1138   K 3.1* 02/15/2012 1159   K 3.5 09/28/2011 0833   CL 102 10/24/2012 1001   CL 102 02/15/2012 1159   CL 98 09/28/2011 0833   CO2 27 01/17/2013 1138   CO2 26 02/15/2012 1159   CO2 30 09/28/2011 0833   BUN 6.9* 01/17/2013 1138   BUN 10 02/15/2012 1159   BUN 9 09/28/2011 0833   CREATININE 0.7 01/17/2013 1138   CREATININE 0.67 02/15/2012 1159   CREATININE 0.7 09/28/2011 0833      Component Value Date/Time   CALCIUM 9.1 01/17/2013 1138   CALCIUM 9.3 02/15/2012 1159   CALCIUM 8.7 09/28/2011 0833   ALKPHOS 109 01/17/2013 1138   ALKPHOS 119* 02/15/2012 1159   ALKPHOS 99* 09/28/2011 0833   AST 17 01/17/2013 1138   AST 12 02/15/2012 1159   AST 15 09/28/2011 0833   ALT 12 01/17/2013 1138   ALT 5 02/15/2012 1159   ALT 16 09/28/2011 0833   BILITOT 0.50 01/17/2013 1138  BILITOT 0.4 02/15/2012 1159   BILITOT 0.80 09/28/2011 4132     RADIOGRAPHIC STUDIES: I have personally reviewed the radiological images as listed and agreed with the findings in the report. No results found.   ASSESSMENT: Recurrent, stage IV metastatic lung cancer, undergoing treatment with Alimta.   PLAN:  #1 lung cancer She tolerated the Alimta apart from fatigue. Her last imaging study done 2 months ago showed no evidence of active disease. We will continue maintained his treatment for now and repeat imaging study in 2 month's time. 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 June MRI scheduled next month and we'll further evaluate to rule out disease recurrence #3 fatigue #4 hypotension She complains of severe fatigue. He could be due to side effects of treatment. I noticed that her blood pressure is borderline low. I recommend she hold the hydrochlorothiazide and recheck her blood pressure at home. If the blood pressure  is less than 140, systolic blood pressure, potentially we can discontinue hydrochlorothiazide and then she does not have to take potassium supplement along with that. On her next visit I will check a thyroid function test to make sure this is not due to the hypothyroidism  #5 constipation According to patient this is chronic. I recommend continues to soften the increased further vegetable intake. It could be also due to hyperthyroidism and I would check her thyroid function tests in the next visit All questions were answered. The patient knows to call the clinic with any problems, questions or concerns. We can certainly see the patient much sooner if necessary. No barriers to learning was detected.  The patient and plan discussed with [facility], [place], Khaliah Barnick  and she is in agreement with the aforementioned.  I spent 25 minutes counseling the patient face to face. The total time spent in the appointment was 40 minutes and more than 50% was on counseling.     [facility], Dezi Brauner, MD 02/06/2013 12:51 PM

## 2013-02-17 ENCOUNTER — Other Ambulatory Visit: Payer: Self-pay

## 2013-02-20 ENCOUNTER — Encounter: Payer: Self-pay | Admitting: Family Medicine

## 2013-02-20 ENCOUNTER — Ambulatory Visit (INDEPENDENT_AMBULATORY_CARE_PROVIDER_SITE_OTHER): Payer: Medicare Other | Admitting: Family Medicine

## 2013-02-20 VITALS — BP 125/73 | HR 80 | Temp 98.2°F | Ht 64.0 in | Wt 157.0 lb

## 2013-02-20 DIAGNOSIS — H538 Other visual disturbances: Secondary | ICD-10-CM

## 2013-02-20 DIAGNOSIS — F17200 Nicotine dependence, unspecified, uncomplicated: Secondary | ICD-10-CM

## 2013-02-20 DIAGNOSIS — I10 Essential (primary) hypertension: Secondary | ICD-10-CM

## 2013-02-20 DIAGNOSIS — E876 Hypokalemia: Secondary | ICD-10-CM

## 2013-02-20 MED ORDER — POTASSIUM CHLORIDE CRYS ER 20 MEQ PO TBCR: 10.0000 meq | EXTENDED_RELEASE_TABLET | Freq: Every day | ORAL | Status: DC

## 2013-02-20 MED ORDER — BENZONATATE 200 MG PO CAPS: 200.0000 mg | ORAL_CAPSULE | Freq: Two times a day (BID) | ORAL | Status: DC | PRN

## 2013-02-20 NOTE — Assessment & Plan Note (Signed)
Encouraged to stop smoking, but pt declines. Cough likely secondary to COPD although lungs sound clear today. Given Rx for Tessalon to use as needed (if too expensive, she can use plain Robitussin especially at night time.)

## 2013-02-20 NOTE — Assessment & Plan Note (Signed)
At goal. Will d/c HCTZ, con't clonidine and bystolic. Decrease Kdur to 1/2 tab daily. Labs every 3 weeks at cancer center. If K+ remains ok, can discuss stopping Kdur. F/u in 3 months or sooner if needed

## 2013-02-20 NOTE — Patient Instructions (Addendum)
Stop HCTZ, take 1/2 the potassium pill.  We will arrange an eye appointment for you.  I will follow up your labs from the cancer center, and see you back in 3 months or sooner if needed.  Cloe Sockwell M. Marji Kuehnel, M.D.

## 2013-02-20 NOTE — Progress Notes (Signed)
Patient ID: Lauren [name], female   DOB: 1950-07-22, 62 y.o.   MRN: [id number]  [facility] Amber M. Hairford, MD Phone: [phone]   Subjective: HPI: Patient is a 62 y.o. female presenting to clinic today for follow up appointment. Concerns today include cough, change in BP meds, blurred vision  1. Hypertension Blood pressure today: 125/73 Taking Meds: HCTZ, Clonidine, nebivolol  Side effects: None ROS: Denies headache, visual changes, nausea, vomiting, chest pain, abdominal pain or shortness of breath. *Patient would like to d/c medications if able. Her BP has been at goal for a long time. She dislikes taking large pills such as her Kdur.   2. Cough She has a cough at night. Some stress incontinence associated with it. History of lung cancer.  Worse in the last 2-3 months. Worse if she is on her left side. She has COPD and she still smokes every day. Uses Albuterol at night time. She has been on chemo since 2008. She does not take OTC medications because she does not know how they will interact with other meds.  3. Blurred vision Patient states her vision is getting worse. Last eye exam over 1 year ago. Eyes water frequently. No redness or pain. Would like referral to eye doctor that will accept her insurance.  History Reviewed: Daily smoker. Health Maintenance: Flu given at cancer center. Declines mammo and pap. Will need Prevnar and pneumovax after age 63. Zostavax postponed because unsure if it will interact with her chemo  ROS: Please see HPI above.  Objective: Office vital signs reviewed. BP 125/73  Pulse 80  Temp(Src) 98.2 F (36.8 C) (Oral)  Ht 5\' 4"  (1.626 m)  Wt 157 lb (71.215 kg)  BMI 26.94 kg/m2  Physical Examination:  General: Awake, alert. NAD HEENT: Atraumatic, normocephalic, MMM Pulm: CTAB, no wheezes. Good effort. Cardio: RRR, no murmurs appreciated Abdomen:+BS, soft, nontender, nondistended Extremities: No edema Neuro:  Grossly intact  Assessment: 62 y.o. female follow up  Plan: See Problem List and After Visit Summary

## 2013-02-20 NOTE — Assessment & Plan Note (Signed)
Will refer to opthalmology for evaluation. Patient has history of HTN, and chronic chemotherapy.

## 2013-02-21 ENCOUNTER — Ambulatory Visit: Payer: Medicare Other | Admitting: Radiation Oncology

## 2013-02-27 ENCOUNTER — Ambulatory Visit (HOSPITAL_BASED_OUTPATIENT_CLINIC_OR_DEPARTMENT_OTHER): Payer: Medicare Other | Admitting: Hematology and Oncology

## 2013-02-27 ENCOUNTER — Other Ambulatory Visit: Payer: Self-pay | Admitting: Lab

## 2013-02-27 ENCOUNTER — Encounter: Payer: Self-pay | Admitting: Hematology and Oncology

## 2013-02-27 ENCOUNTER — Ambulatory Visit (HOSPITAL_BASED_OUTPATIENT_CLINIC_OR_DEPARTMENT_OTHER): Payer: Medicare Other

## 2013-02-27 ENCOUNTER — Telehealth: Payer: Self-pay | Admitting: Hematology and Oncology

## 2013-02-27 ENCOUNTER — Other Ambulatory Visit (HOSPITAL_BASED_OUTPATIENT_CLINIC_OR_DEPARTMENT_OTHER): Payer: Medicare Other | Admitting: Lab

## 2013-02-27 DIAGNOSIS — C3410 Malignant neoplasm of upper lobe, unspecified bronchus or lung: Secondary | ICD-10-CM

## 2013-02-27 DIAGNOSIS — R5383 Other fatigue: Secondary | ICD-10-CM

## 2013-02-27 DIAGNOSIS — R079 Chest pain, unspecified: Secondary | ICD-10-CM

## 2013-02-27 DIAGNOSIS — R05 Cough: Secondary | ICD-10-CM

## 2013-02-27 DIAGNOSIS — F17200 Nicotine dependence, unspecified, uncomplicated: Secondary | ICD-10-CM

## 2013-02-27 DIAGNOSIS — C7931 Secondary malignant neoplasm of brain: Secondary | ICD-10-CM

## 2013-02-27 DIAGNOSIS — R059 Cough, unspecified: Secondary | ICD-10-CM

## 2013-02-27 DIAGNOSIS — R5381 Other malaise: Secondary | ICD-10-CM

## 2013-02-27 DIAGNOSIS — E039 Hypothyroidism, unspecified: Secondary | ICD-10-CM | POA: Insufficient documentation

## 2013-02-27 DIAGNOSIS — Z5111 Encounter for antineoplastic chemotherapy: Secondary | ICD-10-CM

## 2013-02-27 LAB — COMPREHENSIVE METABOLIC PANEL (CC13)
ALT: 10 U/L (ref 0–55)
AST: 14 U/L (ref 5–34)
Albumin: 2.9 g/dL — ABNORMAL LOW (ref 3.5–5.0)
Alkaline Phosphatase: 107 U/L (ref 40–150)
BUN: 7.5 mg/dL (ref 7.0–26.0)
Calcium: 9.2 mg/dL (ref 8.4–10.4)
Chloride: 110 mEq/L — ABNORMAL HIGH (ref 98–109)
Glucose: 110 mg/dl (ref 70–140)
Potassium: 3.5 mEq/L (ref 3.5–5.1)
Sodium: 144 mEq/L (ref 136–145)
Total Protein: 6.6 g/dL (ref 6.4–8.3)

## 2013-02-27 LAB — CBC WITH DIFFERENTIAL/PLATELET
Basophils Absolute: 0 10*3/uL (ref 0.0–0.1)
EOS%: 1.8 % (ref 0.0–7.0)
Eosinophils Absolute: 0.1 10*3/uL (ref 0.0–0.5)
HCT: 40.1 % (ref 34.8–46.6)
HGB: 12.6 g/dL (ref 11.6–15.9)
MCH: 25.6 pg (ref 25.1–34.0)
MCHC: 31.4 g/dL — ABNORMAL LOW (ref 31.5–36.0)
MONO#: 0.5 10*3/uL (ref 0.1–0.9)
NEUT#: 3 10*3/uL (ref 1.5–6.5)
NEUT%: 68.6 % (ref 38.4–76.8)
RBC: 4.93 10*6/uL (ref 3.70–5.45)
RDW: 17.5 % — ABNORMAL HIGH (ref 11.2–14.5)
WBC: 4.4 10*3/uL (ref 3.9–10.3)
lymph#: 0.8 10*3/uL — ABNORMAL LOW (ref 0.9–3.3)
nRBC: 0 % (ref 0–0)

## 2013-02-27 LAB — T4, FREE: Free T4: 1.21 ng/dL (ref 0.80–1.80)

## 2013-02-27 MED ORDER — SODIUM CHLORIDE 0.9 % IV SOLN
Freq: Once | INTRAVENOUS | Status: AC
  Administered 2013-02-27: 10:00:00 via INTRAVENOUS

## 2013-02-27 MED ORDER — SODIUM CHLORIDE 0.9 % IJ SOLN
10.0000 mL | INTRAMUSCULAR | Status: DC | PRN
  Administered 2013-02-27: 10 mL
  Filled 2013-02-27: qty 10

## 2013-02-27 MED ORDER — DEXAMETHASONE SODIUM PHOSPHATE 10 MG/ML IJ SOLN
10.0000 mg | Freq: Once | INTRAMUSCULAR | Status: AC
  Administered 2013-02-27: 10 mg via INTRAVENOUS

## 2013-02-27 MED ORDER — ONDANSETRON 8 MG/NS 50 ML IVPB
INTRAVENOUS | Status: AC
  Filled 2013-02-27: qty 8

## 2013-02-27 MED ORDER — HEPARIN SOD (PORK) LOCK FLUSH 100 UNIT/ML IV SOLN
500.0000 [IU] | Freq: Once | INTRAVENOUS | Status: AC | PRN
  Administered 2013-02-27: 500 [IU]
  Filled 2013-02-27: qty 5

## 2013-02-27 MED ORDER — ONDANSETRON 8 MG/50ML IVPB (CHCC)
8.0000 mg | Freq: Once | INTRAVENOUS | Status: AC
  Administered 2013-02-27: 8 mg via INTRAVENOUS

## 2013-02-27 MED ORDER — SODIUM CHLORIDE 0.9 % IV SOLN
500.0000 mg/m2 | Freq: Once | INTRAVENOUS | Status: AC
  Administered 2013-02-27: 925 mg via INTRAVENOUS
  Filled 2013-02-27: qty 37

## 2013-02-27 MED ORDER — DEXAMETHASONE SODIUM PHOSPHATE 10 MG/ML IJ SOLN
INTRAMUSCULAR | Status: AC
  Filled 2013-02-27: qty 1

## 2013-02-27 NOTE — Progress Notes (Signed)
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 Her post resection surveillance colonoscopy onOctober 14, 2009, by Dr. Christella Hartigan was negative. 2. pT1a, pN0 well differentiated adenocarcinoma of lung; s/p left upper lobectomy on 06/16/2010. 3. 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  4. 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  She underwent stereotactic radiosurgery.  The left occipital 15mm target was treated using 4 Dynamic Conformal Arcs to a prescription dose of 20 Gy in 1 fraction. 6 MV photons, flattening filter free, were used. The right insula 4mm target was treated using 3 Circular Arcs to a prescription dose of 20 Gy in 1 fraction.  6 MV photons, flattening filter free, were used  CURRENT THERAPY: started maintenance chemo Alimta q3 weeks on 03/07/2012.   INTERVAL HISTORY: Lauren [name] 62 y.o. female returns for further followup prior to chemotherapy. Since the last time I saw her, due to profound fatigue, we have discontinue hydrochlorothiazide. She is to have profound fatigue and lack of energy. She also started to have nonproductive cough, worse in the evening. She still smoke a pack of cigarettes per day unable to quit. She also complained of intermittent chest wall discomfort. Denies any recent hemoptysis.She denies any recent fever, chills, night sweats or abnormal weight loss  I have reviewed the past medical  history, past surgical history, social history and family history with the patient and they are unchanged from previous note.  ALLERGIES:  is allergic to lisinopril.  MEDICATIONS:  Current Outpatient Prescriptions  Medication Sig Dispense Refill  . albuterol (PROVENTIL HFA;VENTOLIN HFA) 108 (90 BASE) MCG/ACT inhaler Inhale 2 puffs into the lungs every 6 (six) hours as needed. For shortness of breath  2 Inhaler  11  . aspirin 81 MG EC tablet Take 81 mg by mouth every morning.       . cloNIDine (CATAPRES) 0.1 MG tablet Take 1 tablet (0.1 mg total) by mouth 2 (two) times daily.  60 tablet  5  . diclofenac sodium (VOLTAREN) 1 % GEL Apply 2 g topically 4 (four) times daily. As needed for pain  100 g  11  . folic acid (FOLVITE) 1 MG tablet TAKE 1 TABLET BY MOUTH ONCE DAILY  30 tablet  5  . lidocaine-prilocaine (EMLA) cream Apply topically as needed. Apply to porta cath site one hour prior to needle stick.  30 g  2  . nebivolol (BYSTOLIC) 10 MG tablet Take 1 tablet (10 mg total) by mouth daily.  30 tablet  5  . nitroGLYCERIN (NITROSTAT) 0.4 MG SL tablet Place 1 tablet (0.4 mg total) under the tongue every 5 (five) minutes as needed. For chest pain  10 tablet  0  . omeprazole (PRILOSEC) 20 MG capsule Take 1 capsule (20 mg total) by mouth daily.  30 capsule  5  . potassium chloride SA (KLOR-CON M20) 20 MEQ tablet Take 0.5 tablets (10 mEq total) by mouth daily.  90 tablet  3  . pravastatin (PRAVACHOL) 40 MG tablet Take 1 tablet (40 mg total) by  mouth daily.  30 tablet  5  . tiZANidine (ZANAFLEX) 2 MG tablet Take 1 tablet (2 mg total) by mouth every 6 (six) hours as needed (Back Muscle Spasm).  30 tablet  3  . traMADol (ULTRAM) 50 MG tablet Take 1 tablet (50 mg total) by mouth every 6 (six) hours as needed. As needed for pain.  90 tablet  11  . benzonatate (TESSALON) 200 MG capsule Take 1 capsule (200 mg total) by mouth 2 (two) times daily as needed for cough.  30 capsule  0  . [DISCONTINUED] buPROPion  (WELLBUTRIN XL) 150 MG 24 hr tablet Take 1 tablet (150 mg total) by mouth 2 (two) times daily.  60 tablet  5   No current facility-administered medications for this visit.    REVIEW OF SYSTEMS:   Constitutional: Denies fevers, chills or abnormal weight loss Eyes: Denies blurriness of vision Ears, nose, mouth, throat, and face: Denies mucositis or sore throat Cardiovascular: Denies palpitation, chest discomfort or lower extremity swelling Gastrointestinal:  Denies nausea, heartburn or change in bowel habits Skin: Denies abnormal skin rashes Lymphatics: Denies new lymphadenopathy or easy bruising Neurological:Denies numbness, tingling or new weaknesses Behavioral/Psych: Mood is stable, no new changes  All other systems were reviewed with the patient and are negative.  PHYSICAL EXAMINATION: ECOG PERFORMANCE STATUS: 1 - Symptomatic but completely ambulatory  Filed Vitals:   02/27/13 0853  BP: 150/79  Pulse: 88  Temp: 98 F (36.7 C)  Resp: 20   Filed Weights   02/27/13 0853  Weight: 162 lb 3.2 oz (73.573 kg)    GENERAL:alert, no distress and comfortable SKIN: skin color, texture, turgor are normal, no rashes or significant lesions EYES: normal, Conjunctiva are pink and non-injected, sclera clear OROPHARYNX:no exudate, no erythema and lips, buccal mucosa, and tongue normal  NECK: supple, thyroid normal size, non-tender, without nodularity LYMPH:  no palpable lymphadenopathy in the cervical, axillary or inguinal LUNGS: clear to auscultation and percussion with normal breathing effort HEART: regular rate & rhythm and no murmurs and no lower extremity edema ABDOMEN:abdomen soft, non-tender and normal bowel sounds Musculoskeletal:no cyanosis of digits and no clubbing  NEURO: alert & oriented x 3 with fluent speech, no focal motor/sensory deficits  LABORATORY DATA:  I have reviewed the data as listed    Component Value Date/Time   NA 142 02/06/2013 1154   NA 139 02/15/2012 1159    NA 143 09/28/2011 0833   K 3.6 02/06/2013 1154   K 3.1* 02/15/2012 1159   K 3.5 09/28/2011 0833   CL 102 10/24/2012 1001   CL 102 02/15/2012 1159   CL 98 09/28/2011 0833   CO2 28 02/06/2013 1154   CO2 26 02/15/2012 1159   CO2 30 09/28/2011 0833   GLUCOSE 99 02/06/2013 1154   GLUCOSE 111* 10/24/2012 1001   GLUCOSE 104* 02/15/2012 1159   GLUCOSE 112 09/28/2011 0833   BUN 10.3 02/06/2013 1154   BUN 10 02/15/2012 1159   BUN 9 09/28/2011 0833   CREATININE 0.7 02/06/2013 1154   CREATININE 0.67 02/15/2012 1159   CREATININE 0.7 09/28/2011 0833   CALCIUM 9.3 02/06/2013 1154   CALCIUM 9.3 02/15/2012 1159   CALCIUM 8.7 09/28/2011 0833   PROT 6.8 02/06/2013 1154   PROT 7.2 02/15/2012 1159   PROT 7.2 09/28/2011 0833   ALBUMIN 2.9* 02/06/2013 1154   ALBUMIN 3.4* 02/15/2012 1159   AST 15 02/06/2013 1154   AST 12 02/15/2012 1159   AST 15 09/28/2011 0833   ALT  13 02/06/2013 1154   ALT 5 02/15/2012 1159   ALT 16 09/28/2011 0833   ALKPHOS 100 02/06/2013 1154   ALKPHOS 119* 02/15/2012 1159   ALKPHOS 99* 09/28/2011 0833   BILITOT 0.54 02/06/2013 1154   BILITOT 0.4 02/15/2012 1159   BILITOT 0.80 09/28/2011 0833   GFRNONAA >90 02/15/2012 1159   GFRAA >90 02/15/2012 1159    No results found for this basename: SPEP,  UPEP,   kappa and lambda light chains    Lab Results  Component Value Date   WBC 4.4 02/27/2013   NEUTROABS 3.0 02/27/2013   HGB 12.6 02/27/2013   HCT 40.1 02/27/2013   MCV 81.3 02/27/2013   PLT 298 02/27/2013      Chemistry      Component Value Date/Time   NA 142 02/06/2013 1154   NA 139 02/15/2012 1159   NA 143 09/28/2011 0833   K 3.6 02/06/2013 1154   K 3.1* 02/15/2012 1159   K 3.5 09/28/2011 0833   CL 102 10/24/2012 1001   CL 102 02/15/2012 1159   CL 98 09/28/2011 0833   CO2 28 02/06/2013 1154   CO2 26 02/15/2012 1159   CO2 30 09/28/2011 0833   BUN 10.3 02/06/2013 1154   BUN 10 02/15/2012 1159   BUN 9 09/28/2011 0833   CREATININE 0.7 02/06/2013 1154   CREATININE 0.67 02/15/2012 1159   CREATININE 0.7  09/28/2011 0833      Component Value Date/Time   CALCIUM 9.3 02/06/2013 1154   CALCIUM 9.3 02/15/2012 1159   CALCIUM 8.7 09/28/2011 0833   ALKPHOS 100 02/06/2013 1154   ALKPHOS 119* 02/15/2012 1159   ALKPHOS 99* 09/28/2011 0833   AST 15 02/06/2013 1154   AST 12 02/15/2012 1159   AST 15 09/28/2011 0833   ALT 13 02/06/2013 1154   ALT 5 02/15/2012 1159   ALT 16 09/28/2011 0833   BILITOT 0.54 02/06/2013 1154   BILITOT 0.4 02/15/2012 1159   BILITOT 0.80 09/28/2011 0833     ASSESSMENT:  Stage IV metastatic lung cancer, on maintain his treatment   PLAN:  #1 lung cancer She tolerated the Alimta apart from fatigue. Her last imaging study done 2 months ago showed no evidence of active disease. Due to new complaints above, I would like to repeat imaging study before her next visit. 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 Her next MRI is due and of the month. We will review it when she returned  #3 fatigue I would check a thyroid function test to make sure this is not a contributing factor #4 cough This is nonproductive. This likely due to exacerbation of COPD. The patient continued to smoke. Her primary care provider has provider her with medication to help with the cough  #5 smoking I encouraged the patient to quit smoking. She is interested to quit right now.  Orders Placed This Encounter  Procedures  . CT Abdomen Pelvis W Contrast    Standing Status: Future     Number of Occurrences:      Standing Expiration Date: 02/27/2014    Order Specific Question:  Reason for exam:    Answer:  staging lung cancer, r/o recurrence    Order Specific Question:  Preferred imaging location?    Answer:  [facility]  . CT Chest W Contrast    Standing Status: Future  Number of Occurrences:      Standing Expiration Date: 02/27/2014    Order Specific Question:  Reason for exam:     Answer:  staging lung ca, r/o recurrence    Order Specific Question:  Preferred imaging location?    Answer:  [facility]  . T4 AND TSH    Standing Status: Future     Number of Occurrences:      Standing Expiration Date: 02/27/2014  . Comprehensive metabolic panel    Standing Status: Future     Number of Occurrences:      Standing Expiration Date: 02/27/2014  . CBC with Differential    Standing Status: Future     Number of Occurrences:      Standing Expiration Date: 02/27/2014   All questions were answered. The patient knows to call the clinic with any problems, questions or concerns. No barriers to learning was detected.   Sianne Tejada, MD 02/27/2013 9:19 AM

## 2013-02-27 NOTE — Patient Instructions (Signed)
[facility] Discharge Instructions for Patients Receiving Chemotherapy  Today you received the following chemotherapy agents Alimta  To help prevent nausea and vomiting after your treatment, we encourage you to take your nausea medication as prescribed.   If you develop nausea and vomiting that is not controlled by your nausea medication, call the clinic.   BELOW ARE SYMPTOMS THAT SHOULD BE REPORTED IMMEDIATELY:  *FEVER GREATER THAN 100.5 F  *CHILLS WITH OR WITHOUT FEVER  NAUSEA AND VOMITING THAT IS NOT CONTROLLED WITH YOUR NAUSEA MEDICATION  *UNUSUAL SHORTNESS OF BREATH  *UNUSUAL BRUISING OR BLEEDING  TENDERNESS IN MOUTH AND THROAT WITH OR WITHOUT PRESENCE OF ULCERS  *URINARY PROBLEMS  *BOWEL PROBLEMS  UNUSUAL RASH Items with * indicate a potential emergency and should be followed up as soon as possible.  Feel free to call the clinic you have any questions or concerns. The clinic phone number is [phone].    

## 2013-02-27 NOTE — Telephone Encounter (Signed)
gv adn printed appt sched and avs for pt for OCT...pt desired waterbased

## 2013-03-01 ENCOUNTER — Emergency Department (HOSPITAL_COMMUNITY): Payer: Medicare Other

## 2013-03-01 ENCOUNTER — Inpatient Hospital Stay (HOSPITAL_COMMUNITY)
Admission: EM | Admit: 2013-03-01 | Discharge: 2013-03-03 | DRG: 187 | Disposition: A | Discharge status: Home Health | Payer: Medicare Other | Attending: Family Medicine | Admitting: Family Medicine

## 2013-03-01 ENCOUNTER — Encounter (HOSPITAL_COMMUNITY): Payer: Self-pay | Admitting: Emergency Medicine

## 2013-03-01 ENCOUNTER — Telehealth: Payer: Self-pay | Admitting: *Deleted

## 2013-03-01 DIAGNOSIS — E7800 Pure hypercholesterolemia, unspecified: Secondary | ICD-10-CM

## 2013-03-01 DIAGNOSIS — E039 Hypothyroidism, unspecified: Secondary | ICD-10-CM | POA: Diagnosis present

## 2013-03-01 DIAGNOSIS — R5383 Other fatigue: Secondary | ICD-10-CM

## 2013-03-01 DIAGNOSIS — Z803 Family history of malignant neoplasm of breast: Secondary | ICD-10-CM

## 2013-03-01 DIAGNOSIS — J90 Pleural effusion, not elsewhere classified: Principal | ICD-10-CM | POA: Diagnosis present

## 2013-03-01 DIAGNOSIS — E785 Hyperlipidemia, unspecified: Secondary | ICD-10-CM | POA: Diagnosis present

## 2013-03-01 DIAGNOSIS — C7931 Secondary malignant neoplasm of brain: Secondary | ICD-10-CM | POA: Diagnosis present

## 2013-03-01 DIAGNOSIS — Z7982 Long term (current) use of aspirin: Secondary | ICD-10-CM

## 2013-03-01 DIAGNOSIS — J449 Chronic obstructive pulmonary disease, unspecified: Secondary | ICD-10-CM | POA: Diagnosis present

## 2013-03-01 DIAGNOSIS — F329 Major depressive disorder, single episode, unspecified: Secondary | ICD-10-CM | POA: Diagnosis present

## 2013-03-01 DIAGNOSIS — F17200 Nicotine dependence, unspecified, uncomplicated: Secondary | ICD-10-CM | POA: Diagnosis present

## 2013-03-01 DIAGNOSIS — C3490 Malignant neoplasm of unspecified part of unspecified bronchus or lung: Secondary | ICD-10-CM | POA: Diagnosis present

## 2013-03-01 DIAGNOSIS — J4489 Other specified chronic obstructive pulmonary disease: Secondary | ICD-10-CM | POA: Diagnosis present

## 2013-03-01 DIAGNOSIS — I10 Essential (primary) hypertension: Secondary | ICD-10-CM

## 2013-03-01 DIAGNOSIS — G4700 Insomnia, unspecified: Secondary | ICD-10-CM | POA: Diagnosis present

## 2013-03-01 DIAGNOSIS — Z923 Personal history of irradiation: Secondary | ICD-10-CM

## 2013-03-01 DIAGNOSIS — I2510 Atherosclerotic heart disease of native coronary artery without angina pectoris: Secondary | ICD-10-CM

## 2013-03-01 DIAGNOSIS — Z85038 Personal history of other malignant neoplasm of large intestine: Secondary | ICD-10-CM

## 2013-03-01 DIAGNOSIS — F3289 Other specified depressive episodes: Secondary | ICD-10-CM | POA: Diagnosis present

## 2013-03-01 DIAGNOSIS — K219 Gastro-esophageal reflux disease without esophagitis: Secondary | ICD-10-CM | POA: Diagnosis present

## 2013-03-01 DIAGNOSIS — Z9221 Personal history of antineoplastic chemotherapy: Secondary | ICD-10-CM

## 2013-03-01 DIAGNOSIS — Z800 Family history of malignant neoplasm of digestive organs: Secondary | ICD-10-CM

## 2013-03-01 DIAGNOSIS — Z66 Do not resuscitate: Secondary | ICD-10-CM | POA: Diagnosis present

## 2013-03-01 DIAGNOSIS — Z79899 Other long term (current) drug therapy: Secondary | ICD-10-CM

## 2013-03-01 MED ORDER — SODIUM CHLORIDE 0.9 % IV SOLN
INTRAVENOUS | Status: DC
  Administered 2013-03-02: 01:00:00 via INTRAVENOUS

## 2013-03-01 NOTE — Telephone Encounter (Signed)
Per staff message and POF I have scheduled appts.  JMW  

## 2013-03-01 NOTE — ED Notes (Signed)
Bed: HY86 Expected date: 03/01/13 Expected time: 11:26 PM Means of arrival: Ambulance Comments: Bed 5, 62 F, Hx Brain CA/SOB

## 2013-03-01 NOTE — ED Notes (Signed)
Per EMS: Pt c/o SOB all day, when she laid down to go to bed felt like she was going to suffocate. No chest pain, no N/V. Partial lobectomy L lung. R lung sounds diminished, L upper slight wheezing. Pt given 5 albuterol with some improvement.

## 2013-03-02 ENCOUNTER — Inpatient Hospital Stay (HOSPITAL_COMMUNITY): Payer: Medicare Other

## 2013-03-02 ENCOUNTER — Other Ambulatory Visit: Payer: Self-pay

## 2013-03-02 DIAGNOSIS — J90 Pleural effusion, not elsewhere classified: Principal | ICD-10-CM

## 2013-03-02 DIAGNOSIS — C3490 Malignant neoplasm of unspecified part of unspecified bronchus or lung: Secondary | ICD-10-CM

## 2013-03-02 DIAGNOSIS — I10 Essential (primary) hypertension: Secondary | ICD-10-CM

## 2013-03-02 DIAGNOSIS — F17200 Nicotine dependence, unspecified, uncomplicated: Secondary | ICD-10-CM

## 2013-03-02 DIAGNOSIS — E039 Hypothyroidism, unspecified: Secondary | ICD-10-CM

## 2013-03-02 DIAGNOSIS — R5381 Other malaise: Secondary | ICD-10-CM

## 2013-03-02 DIAGNOSIS — E7800 Pure hypercholesterolemia, unspecified: Secondary | ICD-10-CM

## 2013-03-02 DIAGNOSIS — I2510 Atherosclerotic heart disease of native coronary artery without angina pectoris: Secondary | ICD-10-CM

## 2013-03-02 LAB — CBC WITH DIFFERENTIAL/PLATELET
Basophils Absolute: 0 10*3/uL (ref 0.0–0.1)
Eosinophils Relative: 1 % (ref 0–5)
HCT: 38.9 % (ref 36.0–46.0)
Hemoglobin: 12.1 g/dL (ref 12.0–15.0)
Lymphocytes Relative: 13 % (ref 12–46)
Lymphs Abs: 0.8 10*3/uL (ref 0.7–4.0)
MCH: 25.1 pg — ABNORMAL LOW (ref 26.0–34.0)
MCV: 80.7 fL (ref 78.0–100.0)
Monocytes Absolute: 0.1 10*3/uL (ref 0.1–1.0)
Neutro Abs: 5.3 10*3/uL (ref 1.7–7.7)
Platelets: 304 10*3/uL (ref 150–400)
RBC: 4.82 MIL/uL (ref 3.87–5.11)
RDW: 17.4 % — ABNORMAL HIGH (ref 11.5–15.5)
WBC: 6.3 10*3/uL (ref 4.0–10.5)

## 2013-03-02 LAB — BODY FLUID CELL COUNT WITH DIFFERENTIAL
Lymphs, Fluid: 89 %
Monocyte-Macrophage-Serous Fluid: 5 % — ABNORMAL LOW (ref 50–90)

## 2013-03-02 LAB — BASIC METABOLIC PANEL
CO2: 25 mEq/L (ref 19–32)
Chloride: 108 mEq/L (ref 96–112)
Creatinine, Ser: 0.66 mg/dL (ref 0.50–1.10)
Glucose, Bld: 101 mg/dL — ABNORMAL HIGH (ref 70–99)
Sodium: 143 mEq/L (ref 135–145)

## 2013-03-02 LAB — CBC
MCH: 25.9 pg — ABNORMAL LOW (ref 26.0–34.0)
MCHC: 32.2 g/dL (ref 30.0–36.0)
MCV: 80.6 fL (ref 78.0–100.0)
Platelets: 288 10*3/uL (ref 150–400)
RDW: 17.6 % — ABNORMAL HIGH (ref 11.5–15.5)

## 2013-03-02 LAB — PRO B NATRIURETIC PEPTIDE: Pro B Natriuretic peptide (BNP): 387.7 pg/mL — ABNORMAL HIGH (ref 0–125)

## 2013-03-02 LAB — LACTATE DEHYDROGENASE, PLEURAL OR PERITONEAL FLUID

## 2013-03-02 LAB — TROPONIN I: Troponin I: <0.3 ng/mL (ref <0.30)

## 2013-03-02 LAB — GLUCOSE, SEROUS FLUID: Glucose, Fluid: 94 mg/dL

## 2013-03-02 LAB — PROTEIN, BODY FLUID

## 2013-03-02 LAB — CREATININE, SERUM: GFR calc non Af Amer: >90 mL/min (ref >90)

## 2013-03-02 LAB — MRSA PCR SCREENING: MRSA by PCR: NEGATIVE

## 2013-03-02 MED ORDER — NITROGLYCERIN 0.4 MG SL SUBL: 0.4000 mg | SUBLINGUAL_TABLET | SUBLINGUAL | Status: DC | PRN

## 2013-03-02 MED ORDER — PANTOPRAZOLE SODIUM 40 MG PO TBEC
40.0000 mg | DELAYED_RELEASE_TABLET | Freq: Every day | ORAL | Status: DC
  Administered 2013-03-02 – 2013-03-03 (×2): 40 mg via ORAL
  Filled 2013-03-02 (×2): qty 1

## 2013-03-02 MED ORDER — TRAMADOL HCL 50 MG PO TABS: 50.0000 mg | ORAL_TABLET | Freq: Four times a day (QID) | ORAL | Status: DC | PRN

## 2013-03-02 MED ORDER — ACETAMINOPHEN 650 MG RE SUPP: 650.0000 mg | Freq: Four times a day (QID) | RECTAL | Status: DC | PRN

## 2013-03-02 MED ORDER — SIMVASTATIN 20 MG PO TABS
20.0000 mg | ORAL_TABLET | Freq: Every day | ORAL | Status: DC
  Administered 2013-03-02: 20 mg via ORAL
  Filled 2013-03-02 (×2): qty 1

## 2013-03-02 MED ORDER — TIZANIDINE HCL 2 MG PO TABS
2.0000 mg | ORAL_TABLET | Freq: Four times a day (QID) | ORAL | Status: DC | PRN
  Filled 2013-03-02: qty 1

## 2013-03-02 MED ORDER — POTASSIUM CHLORIDE CRYS ER 20 MEQ PO TBCR
40.0000 meq | EXTENDED_RELEASE_TABLET | Freq: Once | ORAL | Status: AC
  Administered 2013-03-02: 40 meq via ORAL
  Filled 2013-03-02: qty 2

## 2013-03-02 MED ORDER — SODIUM CHLORIDE 0.9 % IJ SOLN
3.0000 mL | Freq: Two times a day (BID) | INTRAMUSCULAR | Status: DC
  Administered 2013-03-02 – 2013-03-03 (×2): 3 mL via INTRAVENOUS

## 2013-03-02 MED ORDER — NEBIVOLOL HCL 10 MG PO TABS
10.0000 mg | ORAL_TABLET | Freq: Every day | ORAL | Status: DC
  Administered 2013-03-02 – 2013-03-03 (×2): 10 mg via ORAL
  Filled 2013-03-02 (×2): qty 1

## 2013-03-02 MED ORDER — BENZONATATE 100 MG PO CAPS
200.0000 mg | ORAL_CAPSULE | ORAL | Status: DC | PRN
  Filled 2013-03-02: qty 2

## 2013-03-02 MED ORDER — HEPARIN SODIUM (PORCINE) 5000 UNIT/ML IJ SOLN
5000.0000 [IU] | Freq: Three times a day (TID) | INTRAMUSCULAR | Status: DC
  Administered 2013-03-02 – 2013-03-03 (×3): 5000 [IU] via SUBCUTANEOUS
  Filled 2013-03-02 (×7): qty 1

## 2013-03-02 MED ORDER — ACETAMINOPHEN 325 MG PO TABS: 650.0000 mg | ORAL_TABLET | Freq: Four times a day (QID) | ORAL | Status: DC | PRN

## 2013-03-02 MED ORDER — FOLIC ACID 1 MG PO TABS
1.0000 mg | ORAL_TABLET | Freq: Every day | ORAL | Status: DC
  Administered 2013-03-02 – 2013-03-03 (×2): 1 mg via ORAL
  Filled 2013-03-02 (×2): qty 1

## 2013-03-02 MED ORDER — ASPIRIN EC 81 MG PO TBEC
81.0000 mg | DELAYED_RELEASE_TABLET | Freq: Every morning | ORAL | Status: DC
  Administered 2013-03-03: 81 mg via ORAL
  Filled 2013-03-02 (×2): qty 1

## 2013-03-02 MED ORDER — POTASSIUM CHLORIDE CRYS ER 10 MEQ PO TBCR
10.0000 meq | EXTENDED_RELEASE_TABLET | Freq: Every day | ORAL | Status: DC
  Administered 2013-03-02 – 2013-03-03 (×2): 10 meq via ORAL
  Filled 2013-03-02 (×2): qty 1

## 2013-03-02 MED ORDER — CLONIDINE HCL 0.1 MG PO TABS
0.1000 mg | ORAL_TABLET | Freq: Two times a day (BID) | ORAL | Status: DC
  Administered 2013-03-02 – 2013-03-03 (×3): 0.1 mg via ORAL
  Filled 2013-03-02 (×4): qty 1

## 2013-03-02 MED ORDER — ALBUTEROL SULFATE HFA 108 (90 BASE) MCG/ACT IN AERS: 2.0000 | INHALATION_SPRAY | Freq: Four times a day (QID) | RESPIRATORY_TRACT | Status: DC | PRN

## 2013-03-02 MED ORDER — OXYCODONE HCL 5 MG PO TABS: 5.0000 mg | ORAL_TABLET | ORAL | Status: DC | PRN

## 2013-03-02 NOTE — Care Management Note (Signed)
    Page 1 of 1   03/02/2013     2:56:43 PM   CARE MANAGEMENT NOTE 03/02/2013  Patient:  Lauren [name], Lauren [name]   Account Number:  [id number]  Date Initiated:  03/02/2013  Documentation initiated by:  [facility]  Subjective/Objective Assessment:   Admitted with SOB -  pleural effusion.     Action/Plan:   Anticipated DC Date:  03/06/2013   Anticipated DC Plan:  HOME W HOME HEALTH SERVICES      DC Planning Services  CM consult      Choice offered to / List presented to:             Status of service:  In process, will continue to follow Medicare Important Message given?   (If response is "NO", the following Medicare IM given date fields will be blank) Date Medicare IM given:   Date Additional Medicare IM given:    Discharge Disposition:    Per UR Regulation:  Reviewed for med. necessity/level of care/duration of stay  If discussed at Long Length of Stay Meetings, dates discussed:    Comments:  Contact:  Donna Christen Daughter [phone]  03-02-13  2:50pm Avie Arenas, RNBSN [phone] Talked with patient, daughter and sister at bedside. Patient lives at home alone.  Drives herself to chemo appts most of time - sometimes daughter does is available.  She is independent at home.  Waiting for procedure to drain effusion.  Agreeable to have [facility] RN visit post discharge. Will talk more after procedure.

## 2013-03-02 NOTE — H&P (Signed)
Family Medicine Teaching [facility] Admission History and Physical Service Pager: [phone]  Patient name: Lauren [name] Medical record number: [id number] Date of birth: 12-Nov-1950 Age: 62 y.o. Gender: female  Primary Care Provider: Rodman Pickle, MD Code Status: DNR  Chief Complaint: Dysnea at rest  Assessment and Plan: Lauren [name] is a 62 y.o. year old female presenting with dyspnea that has worsened over the past two days 1. Pulmonary effusion, SOB: malignancy related vs infectious, cough is chronic, no fevers/chills - call IR for thoracentesis today - Supplemental O2 as needed 2. Chest pain: most likely caused by pleural effusion given location, pt says this feels different than prior anginal pain, neg trop in ED, EKG unchanged, will not cycle troponins at this time - nitro prn 3. COPD: no exacerbation - albuterol prn 4. HTN: 150s/80s here - continue home nebivol, clonidine 5. Lung cancer: h/o colon cancer s/p resection and brain mets s/p XRT - continue home tramadol prn, can escalate if needed - pt requests DNR status 6. Tobacco abuse: patient still currently smoking - nicotine patch while inpatient as needed - cessation education 7. FEN/GI: NPO until after thoracentesis, then regular diet, KVO IVF 8. Prophylaxis: SQ heparin, home PPI 9. Disposition: admit to tele pending clinical improvement and IR drainage  History of Present Illness: Lauren [name] is a 62 y.o. year old female presenting with 2 day onset of progressive dyspnea at rest.  She has a history of Lung Cancer and has been on chemotherapy for the past 5 years.  She was feeling in her usual state of health including having a chronic cough up until 2 days ago when she noted having a difficult time "getting enough air".  When laying down last night she was so short of breath she could not lay down due to her worsening shortness of breath and was recommended to be seen in the ED. She has a history  of pulmonary effusion earlier this year but was not a large enough to need draining. In the [facility] ED she was found to have a large R pulmonary effusion that had developed since CT scan of the chest 3 months prior.  She was sent to [facility] for care on the [facility]   Pt denies any chest pain, shortness of breath, LE edema.  Denies any abdominal pain.  Reports right sided chest pressure described as an "elephant on her chest" for 1 week.  Pressure does not radiate to neck, jaw, or back. No diaphoresis.  IADLS are difficult due to dyspnea.  100yard DOE that is constant.    Patient Active Problem List   Diagnosis Date Noted  . Hypothyroidism 02/27/2013  . Blurred vision 02/20/2013  . Needs flu shot 02/06/2013  . Fatigue 02/06/2013  . Decreased appetite 08/17/2012  . ASCUS with positive high risk HPV 04/21/2012  . Brain metastases treated with radiosurgery 02/26/2012  . Health care maintenance 01/20/2012  . Insomnia 10/05/2011  . Lung cancer   . 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 POSTMENOPAUSAL SYNDROME 01/31/2009  . COPD UNSPECIFIED 05/24/2008  . PERSONAL HX COLON CANCER 02/27/2008  . TOBACCO ABUSE 10/04/2007  . CAD 09/09/2007  . MITRAL VALVE PROLAPSE, HX OF 09/09/2007  . HYPERCHOLESTEROLEMIA 12/29/2006  . DEPRESSIVE DISORDER, MAJOR, RCR, MILD 12/29/2006  . HYPERTENSION, BENIGN ESSENTIAL 12/29/2006  . CEREBRAL ANEURYSM 12/29/2006  . GERD 12/29/2006   Past Medical History: Past Medical History  Diagnosis Date  . Depression   . Hyperlipidemia   . Hypertension   .  Lung nodule     FNA ordered for 04/02/10 by HA>pos Ca  . Chronic folliculitis     of groin  . Fibrocystic breast changes   . Family history of trichomonal vaginitis 05/2005  . Postmenopausal   . Depressive disorder   . Hypercholesterolemia   . GERD (gastroesophageal reflux disease)   . Cerebral aneurysm   . Back pain   . Shortness of breath   . COPD (chronic obstructive pulmonary disease)   . Coronary artery disease    . Arthritis   . Weight loss 10/22/2011  . Status post radiation therapy 11/16/11 - 12/29/11    Right Lung and Mediastinum: 60 Gy  . Status post chemotherapy comp. 12/29/11    Carboplatin/Taxol  . Colon cancer 11/08  . Lung cancer 06/16/10    PET scan 04/28/2010; primary: increase in size 02/2010 / Well Differentiated Adenocarcinoma of the lung   . Brain metastases 02/15/12  . S/P radiation therapy 03/01/12    SRS: 1 fraction / 20 Gray each to the Left Occipital Region and to the Right Insular Metastases  . On antineoplastic chemotherapy started 02/2012    Alimta  . Fatigue 02/06/2013   Past Surgical History: Past Surgical History  Procedure Laterality Date  . Colectomy  03/22/07    Stage 1 pT2 N0, M0 Adenocarcinoma of the sigmoid  colon  . Tubal ligation    . Lung lobectomy  06/16/10    Left Upper Lobectomy  . Hernia repair    . Breast surgery      Bil lumpectomy  . Back surgery    . Mediastinoscopy  10/19/2011    Procedure: MEDIASTINOSCOPY;  Surgeon: Alleen Borne, MD;  Location: [facility] OR;  Service: Thoracic;  Laterality: N/A;  . Cardiac cath x3     Social History: History  Substance Use Topics  . Smoking status: Current Every Day Smoker -- 1.00 packs/day    Types: Cigarettes  . Smokeless tobacco: Current User  . Alcohol Use: 0.0 oz/week     Comment: occasional   For any additional social history documentation, please refer to relevant sections of EMR.  Family History: Family History  Problem Relation Age of Onset  . Stomach cancer Maternal Aunt   . Breast cancer Cousin   . Cancer Sister     Lymphatic  . Osteoarthritis Father   . Gout Father   . Hypertension Father   . Heart disease Mother     pericarditis;   . Anesthesia problems Neg Hx    Allergies: Allergies  Allergen Reactions  . Lisinopril     REACTION: cough   No current facility-administered medications on file prior to encounter.   Current Outpatient Prescriptions on File Prior to Encounter  Medication  Sig Dispense Refill  . albuterol (PROVENTIL HFA;VENTOLIN HFA) 108 (90 BASE) MCG/ACT inhaler Inhale 2 puffs into the lungs every 6 (six) hours as needed. For shortness of breath  2 Inhaler  11  . aspirin 81 MG EC tablet Take 81 mg by mouth every morning.       . benzonatate (TESSALON) 200 MG capsule Take 1 capsule (200 mg total) by mouth 2 (two) times daily as needed for cough.  30 capsule  0  . cloNIDine (CATAPRES) 0.1 MG tablet Take 1 tablet (0.1 mg total) by mouth 2 (two) times daily.  60 tablet  5  . diclofenac sodium (VOLTAREN) 1 % GEL Apply 2 g topically 4 (four) times daily. As needed for pain  100 g  11  . folic acid (FOLVITE) 1 MG tablet TAKE 1 TABLET BY MOUTH ONCE DAILY  30 tablet  5  . lidocaine-prilocaine (EMLA) cream Apply topically as needed. Apply to porta cath site one hour prior to needle stick.  30 g  2  . nebivolol (BYSTOLIC) 10 MG tablet Take 1 tablet (10 mg total) by mouth daily.  30 tablet  5  . omeprazole (PRILOSEC) 20 MG capsule Take 1 capsule (20 mg total) by mouth daily.  30 capsule  5  . potassium chloride SA (KLOR-CON M20) 20 MEQ tablet Take 0.5 tablets (10 mEq total) by mouth daily.  90 tablet  3  . pravastatin (PRAVACHOL) 40 MG tablet Take 1 tablet (40 mg total) by mouth daily.  30 tablet  5  . tiZANidine (ZANAFLEX) 2 MG tablet Take 1 tablet (2 mg total) by mouth every 6 (six) hours as needed (Back Muscle Spasm).  30 tablet  3  . traMADol (ULTRAM) 50 MG tablet Take 1 tablet (50 mg total) by mouth every 6 (six) hours as needed. As needed for pain.  90 tablet  11  . nitroGLYCERIN (NITROSTAT) 0.4 MG SL tablet Place 1 tablet (0.4 mg total) under the tongue every 5 (five) minutes as needed. For chest pain  10 tablet  0  . [DISCONTINUED] buPROPion (WELLBUTRIN XL) 150 MG 24 hr tablet Take 1 tablet (150 mg total) by mouth 2 (two) times daily.  60 tablet  5   Review Of Systems: Per HPI  Otherwise 12 point review of systems was performed and was unremarkable.  Physical  Exam: BP 154/88  Pulse 101  Temp(Src) 98.7 F (37.1 C) (Oral)  Resp 20  Wt 159 lb 3.2 oz (72.213 kg)  BMI 27.31 kg/m2  SpO2 96% Exam: General: ill-appearing AAF, NAD HEENT: MMM, NCAT Cardiovascular: RRR, no murmur, 2+ dp pulses Respiratory: clear on the left, crackles and diminished breath sounds in the right middle and lower lung fields, shallow breathing Abdomen: soft, nt, nd, +bs Extremities: WWP, no LE edema Skin: intact Neuro: alert and oriented, no focal deficits  Labs and Imaging: CBC BMET   Recent Labs Lab 03/02/13 0050  WBC 6.3  HGB 12.1  HCT 38.9  PLT 304    Recent Labs Lab 03/02/13 0050  NA 143  K 3.1*  CL 108  CO2 25  BUN 8  CREATININE 0.66  GLUCOSE 101*  CALCIUM 8.9      03/02/2013 00:50  Troponin I <0.30  Pro B Natriuretic peptide (BNP) 387.7 (H)   CXR: Right sided Port-A-Cath eats present with tip overlying the cavoatrial junction. Cardiac and mediastinal silhouettes are within normal limits. Lung volumes are within normal limits. A moderate layering right pleural effusion is present. Right basilar airspace opacity likely represents associated atelectasis. There is no pulmonary edema. No pneumothorax.  EKG: NSR, no ST changes, stable from prior  Beverely Low, MD, MPH [facility] PGY-1 03/02/2013 5:46 AM    R2 Addendum:  I have seen the above patient and have discussed the patient's presentation, history, objective data, physical exam and assessment and plan with Dr. Richarda Blade.  Briefly, this patient is a 62 y.o. year old female who presented to [facility] ED for evaluation of cough and progressive Dyspnea.  She was found to have a R Pneumothorax that had developed since her last Lung CA surveillance CT 3 months ago.  She will be set up with IR for Diagnostic and Therapeutic Thoracentesis today.  Andrena Mews,  DO [facility] Resident - PGY-3 03/02/2013 6:51 AM

## 2013-03-02 NOTE — Procedures (Addendum)
Successful US guided right thoracentesis. Yielded 1 liter of cloudy turbid blood tinged fluid. Pt tolerated procedure well. No immediate complications.  Specimen was sent for labs. CXR ordered.  Brayton El PA-C 03/02/2013 3:29 PM

## 2013-03-02 NOTE — ED Notes (Signed)
[facility] called 

## 2013-03-02 NOTE — H&P (Signed)
FMTS Attending Note  I personally saw and evaluated the patient. The plan of care was discussed with the resident team. I agree with the assessment and plan as documented by the resident.   62 y/o female with PMH colon CA/lung CA/brain CA presents with increased sob. Found to have right sided pleural effusion on the right side. She currently has sob that is unchanged from time of admission.   Vitals: reviewed Gen: pleasant AAF, NAD Cardiac: RRR, S1 and S2 present, no murmurs Resp: decreased breath sounds in right lung base Abd: soft, non tender, bowel sounds present Ext. No edema, 2+ radial and DP pulses  A/P:  1. Right Pleural Effusion - agree with IR consult for possible thorocentesis 2. Chest pain - likely due to #1, initial cardiac workup negative, monitor 3. HTN - stable on home medications 4. Tobacco Abuse - counseled on smoking cessation 5. Other chronic medical conditions stable.  Donnella Sham MD

## 2013-03-02 NOTE — ED Provider Notes (Signed)
CSN: [id number]     Arrival date & time 03/01/13  2330 History   First MD Initiated Contact with Patient 03/02/13 0026     Chief Complaint  Patient presents with  . Shortness of Breath   (Consider location/radiation/quality/duration/timing/severity/associated sxs/prior Treatment) Patient is a 62 y.o. female presenting with shortness of breath. The history is provided by the patient.  Shortness of Breath  patient here 24 hours of shortness of breath with associated mild nonproductive cough. Patient notes orthopnea and dyspnea on exertion. Denies any lower extremity edema. No leg pain or tenderness. No anginal type chest pain. Does have a history of lung cancer and has had a partial left lung lobectomy. EMS was called and patient given albuterol with some relief. Denies any fever or chills. Denies any urinary symptoms.  Past Medical History  Diagnosis Date  . Depression   . Hyperlipidemia   . Hypertension   . Lung nodule     FNA ordered for 04/02/10 by HA>pos Ca  . Chronic folliculitis     of groin  . Fibrocystic breast changes   . Family history of trichomonal vaginitis 05/2005  . Postmenopausal   . Depressive disorder   . Hypercholesterolemia   . GERD (gastroesophageal reflux disease)   . Cerebral aneurysm   . Back pain   . Shortness of breath   . COPD (chronic obstructive pulmonary disease)   . Coronary artery disease   . Arthritis   . Weight loss 10/22/2011  . Status post radiation therapy 11/16/11 - 12/29/11    Right Lung and Mediastinum: 60 Gy  . Status post chemotherapy comp. 12/29/11    Carboplatin/Taxol  . Colon cancer 11/08  . Lung cancer 06/16/10    PET scan 04/28/2010; primary: increase in size 02/2010 / Well Differentiated Adenocarcinoma of the lung   . Brain metastases 02/15/12  . S/P radiation therapy 03/01/12    SRS: 1 fraction / 20 Gray each to the Left Occipital Region and to the Right Insular Metastases  . On antineoplastic chemotherapy started 02/2012    Alimta   . Fatigue 02/06/2013   Past Surgical History  Procedure Laterality Date  . Colectomy  03/22/07    Stage 1 pT2 N0, M0 Adenocarcinoma of the sigmoid  colon  . Tubal ligation    . Lung lobectomy  06/16/10    Left Upper Lobectomy  . Hernia repair    . Breast surgery      Bil lumpectomy  . Back surgery    . Mediastinoscopy  10/19/2011    Procedure: MEDIASTINOSCOPY;  Surgeon: Alleen Borne, MD;  Location: [facility] OR;  Service: Thoracic;  Laterality: N/A;  . Cardiac cath x3     Family History  Problem Relation Age of Onset  . Stomach cancer Maternal Aunt   . Breast cancer Cousin   . Cancer Sister     Lymphatic  . Osteoarthritis Father   . Gout Father   . Hypertension Father   . Heart disease Mother     pericarditis;   . Anesthesia problems Neg Hx    History  Substance Use Topics  . Smoking status: Current Every Day Smoker -- 1.00 packs/day    Types: Cigarettes  . Smokeless tobacco: Current User  . Alcohol Use: 0.0 oz/week     Comment: occasional   OB History   Grav Para Term Preterm Abortions TAB SAB Ect Mult Living  Review of Systems  Respiratory: Positive for shortness of breath.   All other systems reviewed and are negative.    Allergies  Lisinopril  Home Medications   Current Outpatient Rx  Name  Route  Sig  Dispense  Refill  . albuterol (PROVENTIL HFA;VENTOLIN HFA) 108 (90 BASE) MCG/ACT inhaler   Inhalation   Inhale 2 puffs into the lungs every 6 (six) hours as needed. For shortness of breath   2 Inhaler   11   . aspirin 81 MG EC tablet   Oral   Take 81 mg by mouth every morning.          . benzonatate (TESSALON) 200 MG capsule   Oral   Take 1 capsule (200 mg total) by mouth 2 (two) times daily as needed for cough.   30 capsule   0   . cloNIDine (CATAPRES) 0.1 MG tablet   Oral   Take 1 tablet (0.1 mg total) by mouth 2 (two) times daily.   60 tablet   5   . diclofenac sodium (VOLTAREN) 1 % GEL   Topical   Apply 2 g topically 4  (four) times daily. As needed for pain   100 g   11   . folic acid (FOLVITE) 1 MG tablet      TAKE 1 TABLET BY MOUTH ONCE DAILY   30 tablet   5   . lidocaine-prilocaine (EMLA) cream   Topical   Apply topically as needed. Apply to porta cath site one hour prior to needle stick.   30 g   2   . nebivolol (BYSTOLIC) 10 MG tablet   Oral   Take 1 tablet (10 mg total) by mouth daily.   30 tablet   5   . nitroGLYCERIN (NITROSTAT) 0.4 MG SL tablet   Sublingual   Place 1 tablet (0.4 mg total) under the tongue every 5 (five) minutes as needed. For chest pain   10 tablet   0   . omeprazole (PRILOSEC) 20 MG capsule   Oral   Take 1 capsule (20 mg total) by mouth daily.   30 capsule   5   . potassium chloride SA (KLOR-CON M20) 20 MEQ tablet   Oral   Take 0.5 tablets (10 mEq total) by mouth daily.   90 tablet   3   . pravastatin (PRAVACHOL) 40 MG tablet   Oral   Take 1 tablet (40 mg total) by mouth daily.   30 tablet   5   . tiZANidine (ZANAFLEX) 2 MG tablet   Oral   Take 1 tablet (2 mg total) by mouth every 6 (six) hours as needed (Back Muscle Spasm).   30 tablet   3   . traMADol (ULTRAM) 50 MG tablet   Oral   Take 1 tablet (50 mg total) by mouth every 6 (six) hours as needed. As needed for pain.   90 tablet   11    BP 159/79  Pulse 105  Temp(Src) 98.6 F (37 C) (Oral)  Resp 24  SpO2 97% Physical Exam  Nursing note and vitals reviewed. Constitutional: She is oriented to person, place, and time. She appears well-developed and well-nourished.  Non-toxic appearance. No distress.  HENT:  Head: Normocephalic and atraumatic.  Eyes: Conjunctivae, EOM and lids are normal. Pupils are equal, round, and reactive to light.  Neck: Normal range of motion. Neck supple. No tracheal deviation present. No mass present.  Cardiovascular: Normal rate, regular rhythm and normal heart  sounds.  Exam reveals no gallop.   No murmur heard. Pulmonary/Chest: Effort normal. No stridor.  No respiratory distress. She has decreased breath sounds. She has wheezes. She has no rhonchi. She has no rales.  Abdominal: Soft. Normal appearance and bowel sounds are normal. She exhibits no distension. There is no tenderness. There is no rebound and no CVA tenderness.  Musculoskeletal: Normal range of motion. She exhibits no edema and no tenderness.  Neurological: She is alert and oriented to person, place, and time. She has normal strength. No cranial nerve deficit or sensory deficit. GCS eye subscore is 4. GCS verbal subscore is 5. GCS motor subscore is 6.  Skin: Skin is warm and dry. No abrasion and no rash noted.  Psychiatric: She has a normal mood and affect. Her speech is normal and behavior is normal.    ED Course  Procedures (including critical care time) Labs Review Labs Reviewed  CBC WITH DIFFERENTIAL  BASIC METABOLIC PANEL  PRO B NATRIURETIC PEPTIDE  TROPONIN I   Imaging Review No results found.  EKG Interpretation   None       MDM  No diagnosis found.  With moderate sized pleural effusion on right which is likely the cause of her symptoms. Will be transferred to [facility] for admission to the family practice teaching service   Toy Baker, MD 03/02/13 [id number]

## 2013-03-02 NOTE — Progress Notes (Signed)
INITIAL NUTRITION ASSESSMENT  DOCUMENTATION CODES Per approved criteria  -Not Applicable   INTERVENTION: Advance diet as medically appropriate  RD to follow for nutrition care plan, add interventions accordingly  NUTRITION DIAGNOSIS: Increased nutrient needs related to catabolic illness as evidenced by estimated nutrition needs.   Goal: Pt to meet >/= 90% of their estimated nutrition needs   Monitor:  PO & supplemental intake, weight, labs, I/O's   Reason for Assessment: Malnutrition Screening Tool Report  62 y.o. female  Admitting Dx: dyspnea   ASSESSMENT: Patient with PMH of HTN, COPD, CAD, colon & lung cancer, s/p radiation therapy presented with 2 day onset of progressive dyspnea at rest; in ED was found to have large R effusion.  Patient currently NPO; awaiting thoracentesis; has been followed by [facility] RD as outpatient; note reviewed from March 2014 -- patient has had hx of poor appetite and unintended weight loss; per readings below, weight has been stable per June 2014; drinks Carnation Instant Breakfast at home, however, interested in chocolate Ensure Complete -- RD to order when/as able.  Height: Ht Readings from Last 1 Encounters:  03/02/13 5\' 4"  (1.626 m)    Weight: Wt Readings from Last 1 Encounters:  03/02/13 159 lb 3.2 oz (72.213 kg)    Ideal Body Weight: 120 lb  % Ideal Body Weight: 132%  Wt Readings from Last 10 Encounters:  03/02/13 159 lb 3.2 oz (72.213 kg)  02/27/13 162 lb 3.2 oz (73.573 kg)  02/20/13 157 lb (71.215 kg)  02/06/13 159 lb 1.6 oz (72.167 kg)  01/17/13 160 lb 3.2 oz (72.666 kg)  12/26/12 165 lb 11.2 oz (75.161 kg)  12/01/12 166 lb 6.4 oz (75.479 kg)  11/14/12 166 lb 11.2 oz (75.615 kg)  10/25/12 165 lb (74.844 kg)  10/24/12 165 lb (74.844 kg)    Usual Body Weight: 165 lb  % Usual Body Weight: 99%  BMI:  Body mass index is 27.31 kg/(m^2).  Estimated Nutritional Needs: Kcal: 1800-2000 Protein: 90-100  gm Fluid: 1.8-2.0 L  Skin: Intact  Diet Order: NPO  EDUCATION NEEDS: -No education needs identified at this time   Intake/Output Summary (Last 24 hours) at 03/02/13 1424 Last data filed at 03/02/13 0800  Gross per 24 hour  Intake      0 ml  Output    400 ml  Net   -400 ml    Labs:   Recent Labs Lab 02/27/13 0833 03/02/13 0050 03/02/13 0620  NA 144 143  --   K 3.5 3.1*  --   CL  --  108  --   CO2 25 25  --   BUN 7.5 8  --   CREATININE 0.7 0.66 0.63  CALCIUM 9.2 8.9  --   GLUCOSE 110 101*  --     Scheduled Meds: . aspirin EC  81 mg Oral q morning - 10a  . cloNIDine  0.1 mg Oral BID  . folic acid  1 mg Oral Daily  . heparin  5,000 Units Subcutaneous Q8H  . nebivolol  10 mg Oral Daily  . pantoprazole  40 mg Oral Daily  . potassium chloride  10 mEq Oral Daily  . simvastatin  20 mg Oral q1800  . sodium chloride  3 mL Intravenous Q12H    Continuous Infusions: . sodium chloride 20 mL/hr at 03/02/13 1610    Past Medical History  Diagnosis Date  . Depression   . Hyperlipidemia   . Hypertension   . Lung  nodule     FNA ordered for 04/02/10 by HA>pos Ca  . Chronic folliculitis     of groin  . Fibrocystic breast changes   . Family history of trichomonal vaginitis 05/2005  . Postmenopausal   . Depressive disorder   . Hypercholesterolemia   . GERD (gastroesophageal reflux disease)   . Cerebral aneurysm   . Back pain   . Shortness of breath   . COPD (chronic obstructive pulmonary disease)   . Coronary artery disease   . Arthritis   . Weight loss 10/22/2011  . Status post radiation therapy 11/16/11 - 12/29/11    Right Lung and Mediastinum: 60 Gy  . Status post chemotherapy comp. 12/29/11    Carboplatin/Taxol  . Colon cancer 11/08  . Lung cancer 06/16/10    PET scan 04/28/2010; primary: increase in size 02/2010 / Well Differentiated Adenocarcinoma of the lung   . Brain metastases 02/15/12  . S/P radiation therapy 03/01/12    SRS: 1 fraction / 20 Gray each to  the Left Occipital Region and to the Right Insular Metastases  . On antineoplastic chemotherapy started 02/2012    Alimta  . Fatigue 02/06/2013    Past Surgical History  Procedure Laterality Date  . Colectomy  03/22/07    Stage 1 pT2 N0, M0 Adenocarcinoma of the sigmoid  colon  . Tubal ligation    . Lung lobectomy  06/16/10    Left Upper Lobectomy  . Hernia repair    . Breast surgery      Bil lumpectomy  . Back surgery    . Mediastinoscopy  10/19/2011    Procedure: MEDIASTINOSCOPY;  Surgeon: Alleen Borne, MD;  Location: [facility] OR;  Service: Thoracic;  Laterality: N/A;  . Cardiac cath x3      Maureen Chatters, RD, LDN Pager #: [phone] After-Hours Pager #: [phone]

## 2013-03-02 NOTE — Progress Notes (Signed)
03/02/2013 11:01 AM Nursing note Confirmed with Infectious Disease ok to d/c contact precautions per protocol since MRSA swab negative.  Lauren [name], Blanchard Kelch

## 2013-03-03 MED ORDER — HEPARIN SOD (PORK) LOCK FLUSH 100 UNIT/ML IV SOLN
500.0000 [IU] | INTRAVENOUS | Status: AC | PRN
  Administered 2013-03-03: 500 [IU]

## 2013-03-03 MED ORDER — ENSURE COMPLETE PO LIQD
237.0000 mL | Freq: Two times a day (BID) | ORAL | Status: DC
  Administered 2013-03-03: 237 mL via ORAL

## 2013-03-03 NOTE — Discharge Summary (Signed)
Family Medicine Teaching [facility] Discharge Summary  Patient name: Lauren [name] Medical record number: [id number] Date of birth: [birth year] Age: 62 y.o. Gender: female Date of Admission: 03/01/2013  Date of Discharge: 03/03/2013 Admitting Physician: Uvaldo Rising, MD  Primary Care Provider: Rodman Pickle, MD Consultants: Interventional Radiology  Indication for Hospitalization: Increasing dyspnea at rest  Discharge Diagnoses/Problem List:  Pleural effusion Lung cancer with brain metastases  History of colon cancer Tobacco abuse COPD CAD HTN Hyperlipidemia DNR  Disposition: Discharged home  Discharge Condition: Stable, improved  Discharge Exam:  General: 62 yo female sitting on side of bed in NAD Cardiovascular: RRR, no murmur, 2+ dp pulses  Respiratory: clear on the left, crackles and diminished breath sounds in right lower lung fields improved from admission Abdomen: soft, nt, nd, +bs  Skin: Incision site on R mid-back with bandaid c/d/i  Brief Hospital Course:  Lauren [name] is a 62 y.o. year old female presenting with dyspnea that has worsened over the past two days found to have a right-sided pleural effusion on chest x-ray. Interventional radiology performed an ultrasound-guided thoracentesis and drained about 1 liter of fluid, which resulted in rapid improvement in her symptoms. She was able to wean to room air with adequate oxygenation and was discharged with PCP follow up. Several attempts were made to contact Dr. Bertis Ruddy but were unsuccessful, and she is to call to make an appointment with their office.   Issues for Follow Up:  Follow up oxygenation, and monitor for recurrence of effusion.  Lauren [name] expressed the desire to be DNR.   Significant Procedures: Ultrasound-guided right thoracentesis 03/02/2013  Significant Labs and Imaging:   Recent Labs Lab 02/27/13 0834 03/02/13 0050 03/02/13 0620  WBC 4.4 6.3 8.1  HGB 12.6 12.1 12.3   HCT 40.1 38.9 38.2  PLT 298 304 288    Recent Labs Lab 02/27/13 0833 03/02/13 0050 03/02/13 0620  NA 144 143  --   K 3.5 3.1*  --   CL  --  108  --   CO2 25 25  --   GLUCOSE 110 101*  --   BUN 7.5 8  --   CREATININE 0.7 0.66 0.63  CALCIUM 9.2 8.9  --   ALKPHOS 107  --   --   AST 14  --   --   ALT 10  --   --   ALBUMIN 2.9*  --   --      03/02/2013 15:22  Monocyte-Macrophage-Serous Fluid 5 (L)  Glucose, Fluid 94  Fluid Type-FGLU PLEURAL  Fluid Type-FLDH PLEURAL  LD, Fluid 157 (H)  Total protein, fluid 4.1  Fluid Type-FCT PLEURAL  Fluid Type-FTP PLEURAL  Color, Fluid STRAW (A)  WBC, Fluid 1255 (H)  Lymphs, Fluid 89  Eos, Fluid 1  Appearance, Fluid CLOUDY (A)  Neutrophil Count, Fluid 5  PLEURAL FLUID Gram Stain  FEW WBC PRESENT,BOTH PMN AND MONONUCLEAR NO ORGANISMS SEEN Performed at [facility]  Culture  NO GROWTH 3 DAYS Performed at [facility]   Results/Tests Pending at Time of Discharge: None  Discharge Medications:    Medication List    ASK your doctor about these medications       albuterol 108 (90 BASE) MCG/ACT inhaler  Commonly known as:  PROVENTIL HFA;VENTOLIN HFA  Inhale 2 puffs into the lungs every 6 (six) hours as needed. For shortness of breath     aspirin 81 MG EC tablet  Take 81 mg by mouth every  morning.     benzonatate 200 MG capsule  Commonly known as:  TESSALON  Take 1 capsule (200 mg total) by mouth 2 (two) times daily as needed for cough.     cloNIDine 0.1 MG tablet  Commonly known as:  CATAPRES  Take 1 tablet (0.1 mg total) by mouth 2 (two) times daily.     diclofenac sodium 1 % Gel  Commonly known as:  VOLTAREN  Apply 2 g topically 4 (four) times daily. As needed for pain     folic acid 1 MG tablet  Commonly known as:  FOLVITE  TAKE 1 TABLET BY MOUTH ONCE DAILY     lidocaine-prilocaine cream  Commonly known as:  EMLA  Apply topically as needed. Apply to porta cath site one hour prior to needle  stick.     nebivolol 10 MG tablet  Commonly known as:  BYSTOLIC  Take 1 tablet (10 mg total) by mouth daily.     nitroGLYCERIN 0.4 MG SL tablet  Commonly known as:  NITROSTAT  Place 1 tablet (0.4 mg total) under the tongue every 5 (five) minutes as needed. For chest pain     omeprazole 20 MG capsule  Commonly known as:  PRILOSEC  Take 1 capsule (20 mg total) by mouth daily.     potassium chloride SA 20 MEQ tablet  Commonly known as:  KLOR-CON M20  Take 0.5 tablets (10 mEq total) by mouth daily.     pravastatin 40 MG tablet  Commonly known as:  PRAVACHOL  Take 1 tablet (40 mg total) by mouth daily.     tiZANidine 2 MG tablet  Commonly known as:  ZANAFLEX  Take 1 tablet (2 mg total) by mouth every 6 (six) hours as needed (Back Muscle Spasm).     traMADol 50 MG tablet  Commonly known as:  ULTRAM  Take 1 tablet (50 mg total) by mouth every 6 (six) hours as needed. As needed for pain.        Discharge Instructions: Please refer to Patient Instructions section of EMR for full details.  Patient was counseled important signs and symptoms that should prompt return to medical care, changes in medications, dietary instructions, activity restrictions, and follow up appointments.   Follow-Up Appointments: Follow-up Information   Follow up with Wenda Low, MD On 03/07/2013. (11:00am. Please bring all your medications. )    Specialty:  Family Medicine   Contact information:   [address] [place] [address] [phone]       Follow up with [facility], NI, MD. (Please call for a hospital follow up appointment)    Specialty:  Hematology and Oncology   Contact information:   [address] [place] [address] [phone]      Hazeline Junker, MD 03/03/2013, 12:45 PM PGY-1, [facility] Family Medicine

## 2013-03-03 NOTE — Progress Notes (Signed)
Went over discharge instructions with patient. Patient had no additional questions or concerns related to discharge. IV team came to de-access porta-cath. Patient stable and ready for discharge. Stanton Kidney R

## 2013-03-03 NOTE — Progress Notes (Signed)
Pt ambulated on room air with O2 sats at 100%. Pt denied being SOB or dizzy. Pt at rest was 100% on room air as well.

## 2013-03-05 LAB — BODY FLUID CULTURE

## 2013-03-06 ENCOUNTER — Telehealth: Payer: Self-pay | Admitting: *Deleted

## 2013-03-06 NOTE — Telephone Encounter (Signed)
Pt called states " I  went into the hospital Wed 10/15 because I could not breath, they took 1 liter of fluid off right lung. I just wanted to make sure Dr. Bertis Ruddy knows.  Pt has MRI on 10/27 f/u with MD 11/5. Pt confirmed appts. No further concerns.  Message given to MD for review.

## 2013-03-06 NOTE — Telephone Encounter (Signed)
Ok, got it.  

## 2013-03-07 ENCOUNTER — Encounter: Payer: Self-pay | Admitting: Family Medicine

## 2013-03-07 ENCOUNTER — Ambulatory Visit (INDEPENDENT_AMBULATORY_CARE_PROVIDER_SITE_OTHER): Payer: Medicare Other | Admitting: Family Medicine

## 2013-03-07 VITALS — BP 135/79 | HR 90 | Temp 97.7°F | Wt 160.0 lb

## 2013-03-07 DIAGNOSIS — J4489 Other specified chronic obstructive pulmonary disease: Secondary | ICD-10-CM

## 2013-03-07 DIAGNOSIS — J449 Chronic obstructive pulmonary disease, unspecified: Secondary | ICD-10-CM

## 2013-03-07 DIAGNOSIS — C3490 Malignant neoplasm of unspecified part of unspecified bronchus or lung: Secondary | ICD-10-CM

## 2013-03-07 DIAGNOSIS — F330 Major depressive disorder, recurrent, mild: Secondary | ICD-10-CM

## 2013-03-07 MED ORDER — FLUOXETINE HCL 20 MG PO TABS: 20.0000 mg | ORAL_TABLET | Freq: Every day | ORAL | Status: DC

## 2013-03-07 NOTE — Assessment & Plan Note (Signed)
Continues to smoke with Lung CA - Currently using Albuterol w/o spacer - Spacer provided and patient educated on proper MDI w/ spacer use - Referred to Dr Raymondo Band for PFTs to assess lung fcn; pt not interested in smoking cessation help at this time - Consider Combo inhaler or additional medication pending PFT results

## 2013-03-07 NOTE — Patient Instructions (Signed)
It was great seeing you today. Below is a list of the things we talked about:   1. Start Prozac today: Take every morning 2. Use Spacer with Albuterol every time  Please check-out at the front desk before leaving the clinic.   Make an appointment with Dr Mikel Cella in 1 month.   Make appointment with Dr Raymondo Band for PFTs  I look forward to talking with you again at our next visit. If you have any questions or concerns before then, please call the clinic at [phone].  Take Care,   Dr Wenda Low

## 2013-03-07 NOTE — Progress Notes (Signed)
[facility]  Patient name: Lauren [name] MRN [id number]  Date of birth: August 21, 1950  CC & HPI:  Lauren [name] is a 62 y.o. female presenting today for hospital f/u for pleural effusion. She reports continued SOB especially with walking, but feels like she is back to her baseline. She continues to smoke, and wants to know "how her lungs are." She request a renewal of her handicap parking pass due to SOB with walking longer distances > 100 ft. She also endorses decreased mood, and feeling like she is no longer herself, and able to do the things she wants to. She describes feelings of anhedonia, difficulty sleeping, decreased appetite, decreased energy, and weight loss. She has taken Paxil in the past for anxiety, and denies side-effects. No longer taking Wellbutrin.   ROS:  Please See HPI above otherwise negative.   Pertinent History Reviewed:  Medical & Surgical Hx:  Reviewed.  Medications & Allergies: Reviewed & Updated - see associated section  Social History: Reviewed:   reports that she has been smoking Cigarettes.  She has been smoking about 1.00 pack per day. She uses smokeless tobacco.  History  Alcohol Use  . 0.0 oz/week    Comment: occasional   History  Drug Use No    Objective Findings:  Vitals: BP 135/79  Pulse 90  Temp(Src) 97.7 F (36.5 C) (Oral)  Wt 160 lb (72.576 kg)  BMI 27.45 kg/m2  SpO2 97%  Gen: NAD CV: RRR w/o m/r/g, pulses +2 b/l Resp: CTAB; diffusely decreased breath sounds, but no dullness to percussion; normal respiratory effort Lower Ext: No skin changes; No edema; distal pulses intact; Calves nontender  Assessment & Plan:   Please See Problem Focused Assessment & Plan

## 2013-03-07 NOTE — Assessment & Plan Note (Addendum)
Has taken Wellbutrin, Mirtazapine, Paxil in past - Started Prozac 20 mg qam

## 2013-03-07 NOTE — Assessment & Plan Note (Signed)
No signs/symptoms of reoccurrence of Pleural effusion  - O2 stats 96% on RA, no dec in stats with ambulation - Renewed handicap parking pass due to difficulty ambulation < 200 ft

## 2013-03-13 ENCOUNTER — Ambulatory Visit (HOSPITAL_BASED_OUTPATIENT_CLINIC_OR_DEPARTMENT_OTHER): Payer: Medicare Other

## 2013-03-13 ENCOUNTER — Other Ambulatory Visit: Payer: Self-pay | Admitting: Radiation Therapy

## 2013-03-13 ENCOUNTER — Ambulatory Visit
Admission: RE | Admit: 2013-03-13 | Discharge: 2013-03-13 | Disposition: A | Discharge status: Home / Self Care | Payer: Medicare Other | Source: Ambulatory Visit | Attending: Radiation Oncology | Admitting: Radiation Oncology

## 2013-03-13 ENCOUNTER — Other Ambulatory Visit: Payer: Self-pay | Admitting: Neurosurgery

## 2013-03-13 DIAGNOSIS — C7931 Secondary malignant neoplasm of brain: Secondary | ICD-10-CM

## 2013-03-13 DIAGNOSIS — Z452 Encounter for adjustment and management of vascular access device: Secondary | ICD-10-CM

## 2013-03-13 DIAGNOSIS — C3410 Malignant neoplasm of upper lobe, unspecified bronchus or lung: Secondary | ICD-10-CM

## 2013-03-13 DIAGNOSIS — I671 Cerebral aneurysm, nonruptured: Secondary | ICD-10-CM

## 2013-03-13 DIAGNOSIS — I729 Aneurysm of unspecified site: Secondary | ICD-10-CM

## 2013-03-13 MED ORDER — GADOBENATE DIMEGLUMINE 529 MG/ML IV SOLN
14.0000 mL | Freq: Once | INTRAVENOUS | Status: AC | PRN
  Administered 2013-03-13: 14 mL via INTRAVENOUS

## 2013-03-13 MED ORDER — HEPARIN SOD (PORK) LOCK FLUSH 100 UNIT/ML IV SOLN
500.0000 [IU] | Freq: Once | INTRAVENOUS | Status: AC
  Administered 2013-03-13: 500 [IU] via INTRAVENOUS
  Filled 2013-03-13: qty 5

## 2013-03-13 MED ORDER — SODIUM CHLORIDE 0.9 % IJ SOLN
10.0000 mL | INTRAMUSCULAR | Status: DC | PRN
  Administered 2013-03-13: 10 mL via INTRAVENOUS
  Filled 2013-03-13: qty 10

## 2013-03-14 ENCOUNTER — Encounter: Payer: Self-pay | Admitting: Radiation Oncology

## 2013-03-14 NOTE — Progress Notes (Signed)
  Radiation Oncology         [phone] ________________________________  Name: YORLEY BUCH MRN: [id number]  Date: 03/15/2013  DOB: 24-Nov-1950  Multidisciplinary Neuro Oncology Clinic Follow-Up Visit Note  CC: Rodman Pickle, MD  Hilarie Fredrickson, MD  Diagnosis:   63 yo woman with metastatic adenocarcinoma of the lung s/p SRS on 03/01/2012 when she was treated with stereotactic radiosurgery to a Left occipital 15mm and Right insula 4mm target was treated using 3 Circular Arcs to a prescription dose to 20 Gy in 1 fraction.    Interval Since Last Radiation:  12  months  Narrative:  The patient was a no show.  The recent films were presented in our multidisciplinary conference with neuroradiology just prior to the clinic.  Darl Pikes talked to her over the phone and relayed that the scans looked good.                              Plan:  MRI in 3-4 months.  _____________________________________  Artist Pais. Kathrynn Running, M.D.

## 2013-03-15 ENCOUNTER — Ambulatory Visit
Admission: RE | Admit: 2013-03-15 | Discharge: 2013-03-15 | Disposition: A | Discharge status: Home / Self Care | Payer: Medicare Other | Source: Ambulatory Visit | Attending: Radiation Oncology | Admitting: Radiation Oncology

## 2013-03-16 ENCOUNTER — Encounter: Payer: Self-pay | Admitting: Radiation Therapy

## 2013-03-16 ENCOUNTER — Telehealth: Payer: Self-pay | Admitting: *Deleted

## 2013-03-16 ENCOUNTER — Other Ambulatory Visit: Payer: Self-pay | Admitting: Radiation Therapy

## 2013-03-16 DIAGNOSIS — C7931 Secondary malignant neoplasm of brain: Secondary | ICD-10-CM

## 2013-03-16 NOTE — Telephone Encounter (Signed)
Per Darl Pikes from radiation I have scheduled flush appt for port access on 07/21/13

## 2013-03-16 NOTE — Addendum Note (Signed)
Encounter addended by: Agnes Lawrence, RN on: 03/16/2013 10:32 AM<BR>     Documentation filed: Orders

## 2013-03-16 NOTE — Progress Notes (Unsigned)
Ms. Lauren [name] requested that we increase the time between her brain scans to 4 months instead of three. She is tired from all the doctors appointments and treatments she has been going through. "She says" I just want some time to enjoy my life and the things that others may take for granted." She wants to continue treatment and surveillance, just with time in between to enjoy life and feel like she is not always on the doctor's office. I asked Dr. Basilio Cairo if it is OK to do her brain scans every 4 months instead of every three and she was OK with that.

## 2013-03-20 ENCOUNTER — Ambulatory Visit (HOSPITAL_COMMUNITY)
Admission: RE | Admit: 2013-03-20 | Discharge: 2013-03-20 | Disposition: A | Discharge status: Home / Self Care | Payer: Medicare Other | Source: Ambulatory Visit | Attending: Hematology and Oncology | Admitting: Hematology and Oncology

## 2013-03-20 ENCOUNTER — Other Ambulatory Visit: Payer: Medicare Other | Admitting: Lab

## 2013-03-20 DIAGNOSIS — J9819 Other pulmonary collapse: Secondary | ICD-10-CM | POA: Insufficient documentation

## 2013-03-20 DIAGNOSIS — R0602 Shortness of breath: Secondary | ICD-10-CM | POA: Insufficient documentation

## 2013-03-20 DIAGNOSIS — K7689 Other specified diseases of liver: Secondary | ICD-10-CM | POA: Insufficient documentation

## 2013-03-20 DIAGNOSIS — R05 Cough: Secondary | ICD-10-CM | POA: Insufficient documentation

## 2013-03-20 DIAGNOSIS — K429 Umbilical hernia without obstruction or gangrene: Secondary | ICD-10-CM | POA: Insufficient documentation

## 2013-03-20 DIAGNOSIS — J90 Pleural effusion, not elsewhere classified: Secondary | ICD-10-CM | POA: Insufficient documentation

## 2013-03-20 DIAGNOSIS — Z902 Acquired absence of lung [part of]: Secondary | ICD-10-CM | POA: Insufficient documentation

## 2013-03-20 DIAGNOSIS — I2510 Atherosclerotic heart disease of native coronary artery without angina pectoris: Secondary | ICD-10-CM | POA: Insufficient documentation

## 2013-03-20 DIAGNOSIS — J701 Chronic and other pulmonary manifestations due to radiation: Secondary | ICD-10-CM | POA: Insufficient documentation

## 2013-03-20 DIAGNOSIS — Y842 Radiological procedure and radiotherapy as the cause of abnormal reaction of the patient, or of later complication, without mention of misadventure at the time of the procedure: Secondary | ICD-10-CM | POA: Insufficient documentation

## 2013-03-20 DIAGNOSIS — C3490 Malignant neoplasm of unspecified part of unspecified bronchus or lung: Secondary | ICD-10-CM | POA: Insufficient documentation

## 2013-03-20 DIAGNOSIS — R059 Cough, unspecified: Secondary | ICD-10-CM | POA: Insufficient documentation

## 2013-03-20 MED ORDER — IOHEXOL 300 MG/ML  SOLN
100.0000 mL | Freq: Once | INTRAMUSCULAR | Status: AC | PRN
  Administered 2013-03-20: 100 mL via INTRAVENOUS

## 2013-03-20 MED ORDER — IOHEXOL 300 MG/ML  SOLN
50.0000 mL | Freq: Once | INTRAMUSCULAR | Status: AC | PRN
  Administered 2013-03-20: 50 mL via ORAL

## 2013-03-22 ENCOUNTER — Ambulatory Visit: Payer: Medicare Other | Admitting: Nutrition

## 2013-03-22 ENCOUNTER — Ambulatory Visit (HOSPITAL_BASED_OUTPATIENT_CLINIC_OR_DEPARTMENT_OTHER): Payer: Medicare Other

## 2013-03-22 ENCOUNTER — Telehealth: Payer: Self-pay | Admitting: Hematology and Oncology

## 2013-03-22 ENCOUNTER — Ambulatory Visit (HOSPITAL_BASED_OUTPATIENT_CLINIC_OR_DEPARTMENT_OTHER): Payer: Medicare Other | Admitting: Hematology and Oncology

## 2013-03-22 ENCOUNTER — Telehealth: Payer: Self-pay | Admitting: *Deleted

## 2013-03-22 ENCOUNTER — Other Ambulatory Visit (HOSPITAL_BASED_OUTPATIENT_CLINIC_OR_DEPARTMENT_OTHER): Payer: Medicare Other | Admitting: Lab

## 2013-03-22 DIAGNOSIS — C3410 Malignant neoplasm of upper lobe, unspecified bronchus or lung: Secondary | ICD-10-CM

## 2013-03-22 DIAGNOSIS — C7931 Secondary malignant neoplasm of brain: Secondary | ICD-10-CM

## 2013-03-22 DIAGNOSIS — R5381 Other malaise: Secondary | ICD-10-CM

## 2013-03-22 DIAGNOSIS — Z5111 Encounter for antineoplastic chemotherapy: Secondary | ICD-10-CM

## 2013-03-22 DIAGNOSIS — F17200 Nicotine dependence, unspecified, uncomplicated: Secondary | ICD-10-CM

## 2013-03-22 LAB — COMPREHENSIVE METABOLIC PANEL (CC13)
Alkaline Phosphatase: 121 U/L (ref 40–150)
Anion Gap: 9 mEq/L (ref 3–11)
BUN: 6.2 mg/dL — ABNORMAL LOW (ref 7.0–26.0)
Glucose: 99 mg/dl (ref 70–140)
Total Bilirubin: 0.31 mg/dL (ref 0.20–1.20)

## 2013-03-22 LAB — CBC WITH DIFFERENTIAL/PLATELET
Basophils Absolute: 0 10*3/uL (ref 0.0–0.1)
EOS%: 1.8 % (ref 0.0–7.0)
Eosinophils Absolute: 0.1 10*3/uL (ref 0.0–0.5)
HGB: 13 g/dL (ref 11.6–15.9)
LYMPH%: 14.3 % (ref 14.0–49.7)
MCH: 25.2 pg (ref 25.1–34.0)
MCV: 80.6 fL (ref 79.5–101.0)
MONO%: 11.9 % (ref 0.0–14.0)
NEUT#: 3.7 10*3/uL (ref 1.5–6.5)
Platelets: 295 10*3/uL (ref 145–400)
RBC: 5.16 10*6/uL (ref 3.70–5.45)
lymph#: 0.7 10*3/uL — ABNORMAL LOW (ref 0.9–3.3)

## 2013-03-22 MED ORDER — DEXAMETHASONE SODIUM PHOSPHATE 10 MG/ML IJ SOLN
INTRAMUSCULAR | Status: AC
  Filled 2013-03-22: qty 1

## 2013-03-22 MED ORDER — CYANOCOBALAMIN 1000 MCG/ML IJ SOLN
1000.0000 ug | Freq: Once | INTRAMUSCULAR | Status: AC
  Administered 2013-03-22: 1000 ug via INTRAMUSCULAR

## 2013-03-22 MED ORDER — CYANOCOBALAMIN 1000 MCG/ML IJ SOLN
INTRAMUSCULAR | Status: AC
  Filled 2013-03-22: qty 1

## 2013-03-22 MED ORDER — ONDANSETRON 8 MG/50ML IVPB (CHCC)
8.0000 mg | Freq: Once | INTRAVENOUS | Status: AC
  Administered 2013-03-22: 8 mg via INTRAVENOUS

## 2013-03-22 MED ORDER — SODIUM CHLORIDE 0.9 % IV SOLN
Freq: Once | INTRAVENOUS | Status: AC
  Administered 2013-03-22: 10:00:00 via INTRAVENOUS

## 2013-03-22 MED ORDER — ONDANSETRON 8 MG/NS 50 ML IVPB
INTRAVENOUS | Status: AC
  Filled 2013-03-22: qty 8

## 2013-03-22 MED ORDER — SODIUM CHLORIDE 0.9 % IJ SOLN
10.0000 mL | INTRAMUSCULAR | Status: DC | PRN
Start: 2013-03-22 — End: 2013-03-22
  Administered 2013-03-22: 10 mL
  Filled 2013-03-22: qty 10

## 2013-03-22 MED ORDER — DEXAMETHASONE SODIUM PHOSPHATE 10 MG/ML IJ SOLN
10.0000 mg | Freq: Once | INTRAMUSCULAR | Status: AC
  Administered 2013-03-22: 10 mg via INTRAVENOUS

## 2013-03-22 MED ORDER — HEPARIN SOD (PORK) LOCK FLUSH 100 UNIT/ML IV SOLN
500.0000 [IU] | Freq: Once | INTRAVENOUS | Status: AC | PRN
  Administered 2013-03-22: 500 [IU]
  Filled 2013-03-22: qty 5

## 2013-03-22 MED ORDER — SODIUM CHLORIDE 0.9 % IV SOLN
500.0000 mg/m2 | Freq: Once | INTRAVENOUS | Status: AC
  Administered 2013-03-22: 925 mg via INTRAVENOUS
  Filled 2013-03-22: qty 37

## 2013-03-22 NOTE — Progress Notes (Signed)
I spoke with patient briefly in the chemotherapy room.  Patient reports that she has experienced weight loss.  She is requesting samples of oral nutrition supplements and coupons.  These were provided to her along with some recipes and other suggestions to increase calories and protein to minimize weight loss during treatment.  I will followup with patient during treatment in December.  She has my contact information if needed before then.

## 2013-03-22 NOTE — Telephone Encounter (Signed)
Per staff message and POF I have scheduled appts.  JMW  

## 2013-03-22 NOTE — Progress Notes (Signed)
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 Her post resection surveillance colonoscopy onOctober 14, 2009, by Dr. Christella Hartigan was negative. 2. pT1a, pN0 well differentiated adenocarcinoma of lung; s/p left upper lobectomy on 06/16/2010. 3. 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  4. 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  She underwent stereotactic radiosurgery.  The left occipital 15mm target was treated using 4 Dynamic Conformal Arcs to a prescription dose of 20 Gy in 1 fraction. 6 MV photons, flattening filter free, were used. The right insula 4mm target was treated using 3 Circular Arcs to a prescription dose of 20 Gy in 1 fraction.  6 MV photons, flattening filter free, were used  5. started maintenance chemo Alimta q3 weeks on 03/07/2012.    INTERVAL HISTORY: Lauren [name] 62 y.o. female returns for further followup. Most recently, we ordered a staging CT scan which show large pleural effusion. She had thoracentesis done. Pathology showed no malignant cells In the meantime she continued to have profound fatigue. She also have a nonproductive cough, worse in the evening. I recommended she stop smoking but she is unable to quit. She  denies any recent fever, chills, night sweats or abnormal weight loss  I have reviewed the past medical history, past surgical history, social history and family history with the patient and they are unchanged from previous note.  ALLERGIES:  is allergic to lisinopril.  MEDICATIONS:  Current Outpatient Prescriptions  Medication Sig Dispense Refill  . albuterol (PROVENTIL HFA;VENTOLIN HFA) 108 (90 BASE) MCG/ACT inhaler Inhale 2 puffs into the lungs every 6 (six) hours as needed. For shortness of breath  2 Inhaler  11  . aspirin 81 MG EC tablet Take 81 mg by mouth every morning.       . cloNIDine (CATAPRES) 0.1 MG tablet Take 1 tablet (0.1 mg total) by mouth 2 (two) times daily.  60 tablet  5  . diclofenac sodium (VOLTAREN) 1 % GEL Apply 2 g topically 4 (four) times daily. As needed for pain  100 g  11  . FLUoxetine (PROZAC) 20 MG tablet Take 1 tablet (20 mg total) by mouth daily.  30 tablet  3  . folic acid (FOLVITE) 1 MG tablet TAKE 1 TABLET BY MOUTH ONCE DAILY  30 tablet  5  . lidocaine-prilocaine (EMLA) cream Apply topically as needed. Apply to porta cath site one hour prior to needle stick.  30 g  2  . nebivolol (BYSTOLIC) 10 MG tablet Take 1 tablet (10 mg total) by mouth daily.  30 tablet  5  . nitroGLYCERIN (NITROSTAT) 0.4 MG SL tablet Place 1 tablet (0.4 mg total) under the tongue every 5 (five) minutes as needed. For chest pain  10 tablet  0  . omeprazole (PRILOSEC) 20 MG capsule Take 1 capsule (20 mg total) by mouth daily.  30 capsule  5  . potassium chloride SA (KLOR-CON M20) 20 MEQ tablet Take 0.5 tablets (10 mEq total) by mouth daily.  90 tablet  3  . pravastatin (PRAVACHOL) 40 MG tablet Take 1 tablet (40 mg total) by mouth daily.  30 tablet  5  . tiZANidine (ZANAFLEX) 2 MG tablet Take 1 tablet (2 mg total) by mouth every 6 (six) hours as needed (Back Muscle Spasm).  30 tablet  3  . traMADol (ULTRAM) 50 MG tablet Take 1 tablet (50 mg total) by mouth every 6 (six) hours as needed. As  needed for pain.  90 tablet  11  . benzonatate (TESSALON) 200 MG capsule Take 1 capsule (200 mg total) by mouth 2 (two) times daily as needed for cough.  30 capsule  0  . [DISCONTINUED] buPROPion (WELLBUTRIN XL) 150 MG 24 hr tablet Take 1 tablet (150 mg total) by mouth 2 (two) times daily.  60 tablet  5   No current facility-administered medications for this visit.    REVIEW OF SYSTEMS:   Constitutional: Denies fevers, chills or abnormal weight loss Eyes: Denies blurriness of vision Ears, nose, mouth, throat, and face: Denies mucositis or sore throat Cardiovascular: Denies palpitation, chest discomfort or lower extremity swelling Gastrointestinal:  Denies nausea, heartburn or change in bowel habits Skin: Denies abnormal skin rashes Lymphatics: Denies new lymphadenopathy or easy bruising Neurological:Denies numbness, tingling or new weaknesses Behavioral/Psych: Mood is stable, no new changes  All other systems were reviewed with the patient and are negative.  PHYSICAL EXAMINATION: ECOG PERFORMANCE STATUS: 1 - Symptomatic but completely ambulatory  Filed Vitals:   03/22/13 0902  BP: 143/84  Pulse: 94  Temp: 97.8 F (36.6 C)  Resp: 18   Filed Weights   03/22/13 0902  Weight: 157 lb 4.8 oz (71.351 kg)    GENERAL:alert, no distress and comfortable SKIN: skin color, texture, turgor are normal, no rashes or significant lesions EYES: normal, Conjunctiva are pink and non-injected, sclera clear OROPHARYNX:no exudate, no erythema and lips, buccal mucosa, and tongue normal  NECK: supple, thyroid normal size, non-tender, without nodularity LYMPH:  no palpable lymphadenopathy in the cervical, axillary or inguinal LUNGS:  reduced breath sounds to auscultation and percussion with normal breathing effort HEART: regular rate & rhythm and no murmurs and no lower extremity edema ABDOMEN:abdomen soft, non-tender and normal bowel sounds Musculoskeletal:no cyanosis of digits and no clubbing   NEURO: alert & oriented x 3 with fluent speech, no focal motor/sensory deficits  LABORATORY DATA:  I have reviewed the data as listed    Component Value Date/Time   NA 143 03/02/2013 0050   NA 144 02/27/2013 0833   NA 143 09/28/2011 0833   K 3.1* 03/02/2013 0050   K 3.5 02/27/2013 0833   K 3.5 09/28/2011 0833   CL 108 03/02/2013 0050   CL 102 10/24/2012 1001   CL 98 09/28/2011 0833   CO2 25 03/02/2013 0050   CO2 25 02/27/2013 0833   CO2 30 09/28/2011 0833   GLUCOSE 101* 03/02/2013 0050   GLUCOSE 110 02/27/2013 0833   GLUCOSE 111* 10/24/2012 1001   GLUCOSE 112 09/28/2011 0833   BUN 8 03/02/2013 0050   BUN 7.5 02/27/2013 0833   BUN 9 09/28/2011 0833   CREATININE 0.63 03/02/2013 0620   CREATININE 0.7 02/27/2013 0833   CREATININE 0.7 09/28/2011 0833   CALCIUM 8.9 03/02/2013 0050   CALCIUM 9.2 02/27/2013 0833   CALCIUM 8.7 09/28/2011 0833   PROT 6.6 02/27/2013 1610  PROT 7.2 02/15/2012 1159   PROT 7.2 09/28/2011 0833   ALBUMIN 2.9* 02/27/2013 0833   ALBUMIN 3.4* 02/15/2012 1159   AST 14 02/27/2013 0833   AST 12 02/15/2012 1159   AST 15 09/28/2011 0833   ALT 10 02/27/2013 0833   ALT 5 02/15/2012 1159   ALT 16 09/28/2011 0833   ALKPHOS 107 02/27/2013 0833   ALKPHOS 119* 02/15/2012 1159   ALKPHOS 99* 09/28/2011 0833   BILITOT 0.41 02/27/2013 0833   BILITOT 0.4 02/15/2012 1159   BILITOT 0.80 09/28/2011 0833   GFRNONAA >90 03/02/2013 0620   GFRAA >90 03/02/2013 0620    No results found for this basename: SPEP,  UPEP,   kappa and lambda light chains    Lab Results  Component Value Date   WBC 8.1 03/02/2013   NEUTROABS 5.3 03/02/2013   HGB 12.3 03/02/2013   HCT 38.2 03/02/2013   MCV 80.6 03/02/2013   PLT 288 03/02/2013      Chemistry      Component Value Date/Time   NA 143 03/02/2013 0050   NA 144 02/27/2013 0833   NA 143 09/28/2011 0833   K 3.1* 03/02/2013 0050   K 3.5 02/27/2013 0833   K 3.5 09/28/2011 0833   CL 108 03/02/2013 0050   CL 102 10/24/2012 1001   CL 98 09/28/2011  0833   CO2 25 03/02/2013 0050   CO2 25 02/27/2013 0833   CO2 30 09/28/2011 0833   BUN 8 03/02/2013 0050   BUN 7.5 02/27/2013 0833   BUN 9 09/28/2011 0833   CREATININE 0.63 03/02/2013 0620   CREATININE 0.7 02/27/2013 0833   CREATININE 0.7 09/28/2011 0833      Component Value Date/Time   CALCIUM 8.9 03/02/2013 0050   CALCIUM 9.2 02/27/2013 0833   CALCIUM 8.7 09/28/2011 0833   ALKPHOS 107 02/27/2013 0833   ALKPHOS 119* 02/15/2012 1159   ALKPHOS 99* 09/28/2011 0833   AST 14 02/27/2013 0833   AST 12 02/15/2012 1159   AST 15 09/28/2011 0833   ALT 10 02/27/2013 0833   ALT 5 02/15/2012 1159   ALT 16 09/28/2011 0833   BILITOT 0.41 02/27/2013 0833   BILITOT 0.4 02/15/2012 1159   BILITOT 0.80 09/28/2011 0833       RADIOGRAPHIC STUDIES: I have personally reviewed the radiological images as listed and agreed with the findings in the report. Fortunately, the large pleural effusion has been drained   ASSESSMENT:  Stage IV metastatic lung cancer, no convincing evidence of active disease Recent pleural effusion, status post thoracentesis Chronic fatigue  PLAN:  #1 stage IV lung cancer The patient is experiencing a lot of side effects of profound fatigue. Certainly chemotherapy can cause fatigue. I think the  major problem with her is she continued to smoke. I discussed with her extensively whether we should proceed with further maintainence  chemotherapy versus chemotherapy holiday. The patient wished to continue on chemotherapy I will see her in 6 weeks and she continue chemotherapy every 3 weeks. #2 recent pleural effusion Pathology show no evidence of malignant cells. Certainly it could be due to malignancy and the pathology is just nondiagnostic. We will order periodic checks x-ray to check on this #3 chronic fatigue Am wondering whether she has worsening COPD from smoking. She have a pulmonary function test scheduled this week #4 cigarette smoking I spent a lot of time educating the  patient importance of nicotine cessation.  Orders Placed This Encounter  Procedures  . CBC with  Differential    Standing Status: Standing     Number of Occurrences: 6     Standing Expiration Date: 03/22/2014  . Comprehensive metabolic panel    Standing Status: Standing     Number of Occurrences: 6     Standing Expiration Date: 03/22/2014   All questions were answered. The patient knows to call the clinic with any problems, questions or concerns. No barriers to learning was detected.   Zuley Lutter, MD 03/22/2013 9:32 AM

## 2013-03-22 NOTE — Patient Instructions (Signed)
[facility] Discharge Instructions for Patients Receiving Chemotherapy  Today you received the following chemotherapy agents:  Alimta  To help prevent nausea and vomiting after your treatment, we encourage you to take your nausea medication as ordered per MD.   If you develop nausea and vomiting that is not controlled by your nausea medication, call the clinic.   BELOW ARE SYMPTOMS THAT SHOULD BE REPORTED IMMEDIATELY:  *FEVER GREATER THAN 100.5 F  *CHILLS WITH OR WITHOUT FEVER  NAUSEA AND VOMITING THAT IS NOT CONTROLLED WITH YOUR NAUSEA MEDICATION  *UNUSUAL SHORTNESS OF BREATH  *UNUSUAL BRUISING OR BLEEDING  TENDERNESS IN MOUTH AND THROAT WITH OR WITHOUT PRESENCE OF ULCERS  *URINARY PROBLEMS  *BOWEL PROBLEMS  UNUSUAL RASH Items with * indicate a potential emergency and should be followed up as soon as possible.  Feel free to call the clinic you have any questions or concerns. The clinic phone number is [phone].    

## 2013-03-22 NOTE — Telephone Encounter (Signed)
Email to MW to schedule next chemo avs and cal gvn to pt shh

## 2013-03-23 ENCOUNTER — Ambulatory Visit (INDEPENDENT_AMBULATORY_CARE_PROVIDER_SITE_OTHER): Payer: Medicare Other | Admitting: Pharmacist

## 2013-03-23 ENCOUNTER — Encounter: Payer: Self-pay | Admitting: Pharmacist

## 2013-03-23 VITALS — BP 144/82 | HR 83 | Ht 64.5 in | Wt 159.0 lb

## 2013-03-23 DIAGNOSIS — J449 Chronic obstructive pulmonary disease, unspecified: Secondary | ICD-10-CM

## 2013-03-23 MED ORDER — BENZONATATE 200 MG PO CAPS: 200.0000 mg | ORAL_CAPSULE | Freq: Two times a day (BID) | ORAL | Status: DC | PRN

## 2013-03-23 MED ORDER — ALBUTEROL SULFATE HFA 108 (90 BASE) MCG/ACT IN AERS: 2.0000 | INHALATION_SPRAY | Freq: Four times a day (QID) | RESPIRATORY_TRACT | Status: DC | PRN

## 2013-03-23 NOTE — Patient Instructions (Addendum)
Use albuterol inhaler 2 puffs 4 times daily (in the morning, around lunch, news time, and before bed). Continue to use spacer with 1 puff at a time and wait 30 seconds between puffs.   Take your cough pills morning and night.   Great to see you!  Keep up the great work, one day at a time!

## 2013-03-23 NOTE — Progress Notes (Signed)
Patient ID: Lauren [name], female   DOB: Aug 17, 1950, 62 y.o.   MRN: [id number] Reviewed: Agree with Dr. Macky Lower management and documentation.

## 2013-03-23 NOTE — Progress Notes (Signed)
S:    Patient arrives independently with nervousness about the function of her lungs.  She immediately reports continuing to smoke cigarettes despite having 4 types of caners. She presents for lung function evaluation. Patient reports breathing has been chronically difficult. She reports having most of her left lung removed in 2012 secondary to lung cancer. She confirms that she has been receiving chemotherapy for the past 16 months.   O: mMRC score= >/= 2  See "scanned report" or Documentation Flowsheet (discrete results - PFTs) for  Spirometry results. Patient provided good effort while attempting spirometry.   Lung Age = 89 years  Ambulatory O2 Saturation 97% with walking in clinic. She was able to walk 3/4 around the office before having to stop and catch her breath. Patient is resistance to the possibility of smoking cessation. She feels that it will not make a difference in her health.   A/P: Spirometry evaluation reveals Severe obstruction.   Patient has been experiencing difficulty breathing and SOB for several years and taking Albuterol twice daily . Increase frequency of albuterol to 4 times daily in the morning, afternoon, evening, and before bed.  Re-educated patient on proper use of meter dosed inhaler and spacer . Refilled Tessalon #60 1 BID. Reviewed results of pulmonary function tests.  Pt verbalized understanding of results and education.  Written pt instructions provided.  F/U with Dr. Mikel Cella.   Total time in face to face counseling 40 minutes.  Patient seen with Leafy Kindle, PharmD Candidate.

## 2013-03-23 NOTE — Assessment & Plan Note (Signed)
Spirometry evaluation reveals Severe obstruction.   Patient has been experiencing difficulty breathing and SOB for several years and taking Albuterol twice daily . Increase frequency of albuterol to 4 times daily in the morning, afternoon, evening, and before bed.  Re-educated patient on proper use of meter dosed inhaler and spacer . Refilled Tessalon #60 1 BID. Reviewed results of pulmonary function tests.  Pt verbalized understanding of results and education.  Written pt instructions provided.  F/U with Dr. Mikel Cella.   Total time in face to face counseling 40 minutes.  Patient seen with Leafy Kindle, PharmD Candidate.

## 2013-04-04 ENCOUNTER — Encounter: Payer: Self-pay | Admitting: Family Medicine

## 2013-04-04 ENCOUNTER — Ambulatory Visit (INDEPENDENT_AMBULATORY_CARE_PROVIDER_SITE_OTHER): Payer: Medicare Other | Admitting: Family Medicine

## 2013-04-04 VITALS — BP 137/82 | HR 94 | Temp 97.8°F | Ht 64.5 in | Wt 157.0 lb

## 2013-04-04 DIAGNOSIS — J449 Chronic obstructive pulmonary disease, unspecified: Secondary | ICD-10-CM

## 2013-04-04 DIAGNOSIS — K219 Gastro-esophageal reflux disease without esophagitis: Secondary | ICD-10-CM

## 2013-04-04 DIAGNOSIS — I10 Essential (primary) hypertension: Secondary | ICD-10-CM

## 2013-04-04 LAB — POCT H PYLORI SCREEN: H Pylori Screen, POC: POSITIVE

## 2013-04-04 MED ORDER — OMEPRAZOLE 40 MG PO CPDR: 40.0000 mg | DELAYED_RELEASE_CAPSULE | Freq: Every day | ORAL | Status: DC

## 2013-04-04 NOTE — Patient Instructions (Signed)
We will check for bacteria in your stomach today that can make your acid reflux worse.  We will increase your Omeprazole to help for the abdominal pain.  We can try you off the potassium pill, but if your number stays low we will have to start you back on it.  I will see back in 3 months!  Lauren [name], M.D.

## 2013-04-04 NOTE — Progress Notes (Signed)
Patient ID: Lauren [name], female   DOB: 04/08/1951, 62 y.o.   MRN: [id number]  [facility] Tison Leibold M. Olivya Sobol, MD Phone: [phone]   Subjective: HPI: Patient is a 62 y.o. female presenting to clinic today for follow up appointment. Concerns today include shoulder pain  1. COPD/lung cancer - Never wears O2. Using albuterol 4 times per day. She states her breathing was difficult yesterday. She gets concerned because of "tingles in arms, fingers and bottom" at night time which goes away. She states she lays still and they go away. Some SOB associated with it. She was seen by Dr. Raymondo Band for PFT.   2. Hypertension - We stopped her BP pill and she is doing well. Wants to come off of potassium pill. BP measured by home health nurses which is doing well. Blood pressure today:  Taking Meds: Clonidine, Bystolic Side effects: None ROS: Denies headache, visual changes, nausea, vomiting, chest pain, abdominal pain or shortness of breath.  3. Abd Pain - Has GERD. Takes Omeprazole, but started hurting after tomato soup last weekend. Pain is intermittent, nothing makes it better or worse. No vomiting.  History Reviewed: Every day smoker. No desire to quit Health Maintenance: Had flu shot at cancer center  ROS: Please see HPI above.  Objective: Office vital signs reviewed. BP 137/82  Pulse 94  Temp(Src) 97.8 F (36.6 C) (Oral)  Ht 5' 4.5" (1.638 m)  Wt 157 lb (71.215 kg)  BMI 26.54 kg/m2  Physical Examination:  General: Awake, alert. NAD. HEENT: Atraumatic, normocephalic. MMM Pulm: CTAB L>R, no wheezes appreciated Cardio: RRR, no murmurs appreciated Abdomen:+BS, soft nondistended, TTP in epigastric area Extremities: No edema Neuro: Grossly intact  Assessment: 62 y.o. female follow up Plan: See Problem List and After Visit Summary

## 2013-04-05 ENCOUNTER — Telehealth: Payer: Self-pay | Admitting: Family Medicine

## 2013-04-05 MED ORDER — CLARITHROMYCIN 500 MG PO TABS: 500.0000 mg | ORAL_TABLET | Freq: Two times a day (BID) | ORAL | Status: DC

## 2013-04-05 MED ORDER — AMOXICILLIN 500 MG PO CAPS: 1000.0000 mg | ORAL_CAPSULE | Freq: Two times a day (BID) | ORAL | Status: DC

## 2013-04-05 NOTE — Telephone Encounter (Signed)
Can you please let Ms. Govea know that the test we did to check for a bacteria (called H.pylori) in her stomach was positive. This is likely the cause of her abdominal pain since the bacteria can cause stomach ulcers. She should keep taking her Prilosec 40mg , but I called in 2 antibiotics for her to take also. (I am not sure how much they will cost but it is the only way to treat the bacteria.)  Thank you! Dericka Ostenson M. Journee Bobrowski, M.D.

## 2013-04-05 NOTE — Assessment & Plan Note (Signed)
Anxiety likely playing role in her SOB. She has no desire to quit smoking at this time. Con't to follow up with oncology and with pulmonology. She does not meet requirements for home O2. F/u as needed

## 2013-04-05 NOTE — Assessment & Plan Note (Signed)
BP at goal off medications. Will keep monitoring. Patient wants to stop her Kdur because she hates taking the pill. Will try her off the medication (I question her adherence anyway) and con't to check K+ at the cancer center. If K+ stays low she will need to restart medication.

## 2013-04-05 NOTE — Assessment & Plan Note (Signed)
Abd pain persistent despite PPI treatment. Will increase to Prilosec 40mg  and check H.plyori.

## 2013-04-07 NOTE — Telephone Encounter (Signed)
Pt informed. Lauren [name]  

## 2013-04-12 ENCOUNTER — Ambulatory Visit (HOSPITAL_BASED_OUTPATIENT_CLINIC_OR_DEPARTMENT_OTHER): Payer: Medicare Other | Admitting: Lab

## 2013-04-12 ENCOUNTER — Other Ambulatory Visit: Payer: Self-pay | Admitting: *Deleted

## 2013-04-12 ENCOUNTER — Ambulatory Visit (HOSPITAL_BASED_OUTPATIENT_CLINIC_OR_DEPARTMENT_OTHER): Payer: Medicare Other

## 2013-04-12 DIAGNOSIS — C3410 Malignant neoplasm of upper lobe, unspecified bronchus or lung: Secondary | ICD-10-CM

## 2013-04-12 DIAGNOSIS — Z5111 Encounter for antineoplastic chemotherapy: Secondary | ICD-10-CM

## 2013-04-12 DIAGNOSIS — C7931 Secondary malignant neoplasm of brain: Secondary | ICD-10-CM

## 2013-04-12 LAB — CBC WITH DIFFERENTIAL/PLATELET
BASO%: 0.5 % (ref 0.0–2.0)
Basophils Absolute: 0 10*3/uL (ref 0.0–0.1)
Eosinophils Absolute: 0.1 10*3/uL (ref 0.0–0.5)
HCT: 41 % (ref 34.8–46.6)
HGB: 12.9 g/dL (ref 11.6–15.9)
LYMPH%: 15.7 % (ref 14.0–49.7)
MCH: 25.3 pg (ref 25.1–34.0)
MONO#: 0.7 10*3/uL (ref 0.1–0.9)
NEUT#: 2.9 10*3/uL (ref 1.5–6.5)
NEUT%: 66.9 % (ref 38.4–76.8)
RBC: 5.1 10*6/uL (ref 3.70–5.45)
RDW: 18.4 % — ABNORMAL HIGH (ref 11.2–14.5)
WBC: 4.3 10*3/uL (ref 3.9–10.3)
lymph#: 0.7 10*3/uL — ABNORMAL LOW (ref 0.9–3.3)

## 2013-04-12 MED ORDER — SODIUM CHLORIDE 0.9 % IV SOLN
500.0000 mg/m2 | Freq: Once | INTRAVENOUS | Status: AC
  Administered 2013-04-12: 925 mg via INTRAVENOUS
  Filled 2013-04-12: qty 37

## 2013-04-12 MED ORDER — ONDANSETRON 8 MG/NS 50 ML IVPB
INTRAVENOUS | Status: AC
  Filled 2013-04-12: qty 8

## 2013-04-12 MED ORDER — DEXAMETHASONE SODIUM PHOSPHATE 10 MG/ML IJ SOLN
10.0000 mg | Freq: Once | INTRAMUSCULAR | Status: AC
  Administered 2013-04-12: 10 mg via INTRAVENOUS

## 2013-04-12 MED ORDER — ONDANSETRON 8 MG/50ML IVPB (CHCC)
8.0000 mg | Freq: Once | INTRAVENOUS | Status: AC
  Administered 2013-04-12: 8 mg via INTRAVENOUS

## 2013-04-12 MED ORDER — SODIUM CHLORIDE 0.9 % IV SOLN
Freq: Once | INTRAVENOUS | Status: AC
  Administered 2013-04-12: 10:00:00 via INTRAVENOUS

## 2013-04-12 MED ORDER — DEXAMETHASONE SODIUM PHOSPHATE 10 MG/ML IJ SOLN
INTRAMUSCULAR | Status: AC
  Filled 2013-04-12: qty 1

## 2013-04-12 MED ORDER — SODIUM CHLORIDE 0.9 % IJ SOLN
10.0000 mL | INTRAMUSCULAR | Status: DC | PRN
  Administered 2013-04-12: 10 mL
  Filled 2013-04-12: qty 10

## 2013-04-12 MED ORDER — HEPARIN SOD (PORK) LOCK FLUSH 100 UNIT/ML IV SOLN
500.0000 [IU] | Freq: Once | INTRAVENOUS | Status: AC | PRN
  Administered 2013-04-12: 500 [IU]
  Filled 2013-04-12: qty 5

## 2013-04-12 NOTE — Patient Instructions (Signed)
[facility] Discharge Instructions for Patients Receiving Chemotherapy  Today you received the following chemotherapy agents Alimta  To help prevent nausea and vomiting after your treatment, we encourage you to take your nausea medication as prescribed.   If you develop nausea and vomiting that is not controlled by your nausea medication, call the clinic.   BELOW ARE SYMPTOMS THAT SHOULD BE REPORTED IMMEDIATELY:  *FEVER GREATER THAN 100.5 F  *CHILLS WITH OR WITHOUT FEVER  NAUSEA AND VOMITING THAT IS NOT CONTROLLED WITH YOUR NAUSEA MEDICATION  *UNUSUAL SHORTNESS OF BREATH  *UNUSUAL BRUISING OR BLEEDING  TENDERNESS IN MOUTH AND THROAT WITH OR WITHOUT PRESENCE OF ULCERS  *URINARY PROBLEMS  *BOWEL PROBLEMS  UNUSUAL RASH Items with * indicate a potential emergency and should be followed up as soon as possible.  Feel free to call the clinic you have any questions or concerns. The clinic phone number is [phone].    

## 2013-04-17 ENCOUNTER — Telehealth: Payer: Self-pay | Admitting: *Deleted

## 2013-04-17 NOTE — Telephone Encounter (Signed)
Pt c/o feeling weaker, very tired since last Thursday.  Had Alimta last Wednesday.  States in bed most of the day,  Only up for about an hour at a time before she has to go lay down again.  Denies any fevers.  States on several antibiotics for H.pylori and also has no appetite.  Able to drink fluids, but unable to eat much real food.  Pt says she is "dragging like a snail" and asks if there is anything she can do to feel better?

## 2013-04-17 NOTE — Telephone Encounter (Signed)
Informed pt of Dr. Maxine Glenn message below.  Pt verbalized understanding.

## 2013-04-17 NOTE — Telephone Encounter (Signed)
Side effects of chemo Nothing we can do now to reverse her symptoms I'm seeing her middle of this month, we can consider stopping for chemo holiday

## 2013-05-03 ENCOUNTER — Telehealth: Payer: Self-pay | Admitting: Hematology and Oncology

## 2013-05-03 ENCOUNTER — Ambulatory Visit (HOSPITAL_BASED_OUTPATIENT_CLINIC_OR_DEPARTMENT_OTHER): Payer: Medicare Other | Admitting: Hematology and Oncology

## 2013-05-03 ENCOUNTER — Encounter: Payer: Self-pay | Admitting: Hematology and Oncology

## 2013-05-03 ENCOUNTER — Other Ambulatory Visit: Payer: Self-pay | Admitting: Family Medicine

## 2013-05-03 ENCOUNTER — Ambulatory Visit (HOSPITAL_COMMUNITY)
Admission: RE | Admit: 2013-05-03 | Discharge: 2013-05-03 | Disposition: A | Discharge status: Home / Self Care | Payer: Medicare Other | Source: Ambulatory Visit | Attending: Hematology and Oncology | Admitting: Hematology and Oncology

## 2013-05-03 ENCOUNTER — Other Ambulatory Visit (HOSPITAL_BASED_OUTPATIENT_CLINIC_OR_DEPARTMENT_OTHER): Payer: Medicare Other

## 2013-05-03 ENCOUNTER — Ambulatory Visit: Payer: Medicare Other | Admitting: Nutrition

## 2013-05-03 ENCOUNTER — Ambulatory Visit: Payer: Self-pay

## 2013-05-03 VITALS — BP 152/68 | HR 95 | Temp 97.8°F | Resp 20 | Ht 64.0 in | Wt 152.1 lb

## 2013-05-03 DIAGNOSIS — C3490 Malignant neoplasm of unspecified part of unspecified bronchus or lung: Secondary | ICD-10-CM

## 2013-05-03 DIAGNOSIS — J90 Pleural effusion, not elsewhere classified: Secondary | ICD-10-CM

## 2013-05-03 DIAGNOSIS — F17200 Nicotine dependence, unspecified, uncomplicated: Secondary | ICD-10-CM

## 2013-05-03 DIAGNOSIS — R5381 Other malaise: Secondary | ICD-10-CM

## 2013-05-03 HISTORY — DX: Pleural effusion, not elsewhere classified: J90

## 2013-05-03 LAB — CBC WITH DIFFERENTIAL/PLATELET
BASO%: 0.5 % (ref 0.0–2.0)
EOS%: 1.6 % (ref 0.0–7.0)
HCT: 38.1 % (ref 34.8–46.6)
MCH: 24.8 pg — ABNORMAL LOW (ref 25.1–34.0)
MCHC: 31 g/dL — ABNORMAL LOW (ref 31.5–36.0)
MCV: 80 fL (ref 79.5–101.0)
MONO%: 9.9 % (ref 0.0–14.0)
RBC: 4.76 10*6/uL (ref 3.70–5.45)
RDW: 18.3 % — ABNORMAL HIGH (ref 11.2–14.5)
nRBC: 0 % (ref 0–0)

## 2013-05-03 NOTE — Telephone Encounter (Signed)
s.w. pt and advised on 12.18 appt with Dr.  Laneta Simmers...pt ok and aware

## 2013-05-03 NOTE — Progress Notes (Signed)
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 Her post resection surveillance colonoscopy onOctober 14, 2009, by Dr. Christella Hartigan was negative. 2. pT1a, pN0 well differentiated adenocarcinoma of lung; s/p left upper lobectomy on 06/16/2010. 3. 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  4. 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  She underwent stereotactic radiosurgery.  The left occipital 15mm target was treated using 4 Dynamic Conformal Arcs to a prescription dose of 20 Gy in 1 fraction. 6 MV photons, flattening filter free, were used. The right insula 4mm target was treated using 3 Circular Arcs to a prescription dose of 20 Gy in 1 fraction.  6 MV photons, flattening filter free, were used 5. started maintenance chemo Alimta q3 weeks on 03/07/2012.  6. October 2014, she had pleural effusion requiring thoracentesis. Cytology showed suspicious cells INTERVAL HISTORY: Lauren [name] 62 y.o. female returns for further followup. Her symptoms are persistent fatigue and shortness of breath on minimal exertion. She has poor appetite and lost 5 pounds of weight. Denies any headaches or new neurological deficit. She had chronic  nonproductive cough with no hemoptysis. The patient continued to smoke half a packs cigarettes a day and unable to quit.  I have reviewed the past medical history, past surgical history, social history and family history with the patient and they are unchanged from previous note.  ALLERGIES:  is allergic to lisinopril.  MEDICATIONS:  Current Outpatient Prescriptions  Medication Sig Dispense Refill  . albuterol (PROVENTIL HFA;VENTOLIN HFA) 108 (90 BASE) MCG/ACT inhaler Inhale 2 puffs into the lungs every 6 (six) hours as needed. For shortness of breath  2 Inhaler  11  . amoxicillin (AMOXIL) 500 MG capsule Take 2 capsules (1,000 mg total) by mouth 2 (two) times daily.  40 capsule  0  . aspirin 81 MG EC tablet Take 81 mg by mouth every morning.       . benzonatate (TESSALON) 200 MG capsule Take 1 capsule (200 mg total) by mouth 2 (two) times daily as needed for cough.  60 capsule  3  . cloNIDine (CATAPRES) 0.1 MG tablet Take 1 tablet (0.1 mg total) by mouth 2 (two) times daily.  60 tablet  5  . diclofenac sodium (VOLTAREN) 1 % GEL Apply 2 g topically 4 (four) times daily. As needed for pain  100 g  11  . folic acid (FOLVITE) 1 MG tablet TAKE 1 TABLET BY MOUTH ONCE DAILY  30 tablet  5  . lidocaine-prilocaine (EMLA) cream Apply topically as needed. Apply to porta cath site one hour prior to needle stick.  30 g  2  . nitroGLYCERIN (NITROSTAT) 0.4 MG SL tablet Place 1 tablet (0.4 mg total) under the tongue every 5 (five) minutes as needed. For chest pain  10 tablet  0  . omeprazole (PRILOSEC) 40 MG capsule Take 1  capsule (40 mg total) by mouth daily.  90 capsule  2  . potassium chloride SA (KLOR-CON M20) 20 MEQ tablet Take 0.5 tablets (10 mEq total) by mouth daily.  90 tablet  3  . pravastatin (PRAVACHOL) 40 MG tablet Take 1 tablet (40 mg total) by mouth daily.  30 tablet  5  . tiZANidine (ZANAFLEX) 2 MG tablet Take 1 tablet (2 mg total) by mouth every 6 (six) hours as needed (Back Muscle Spasm).  30  tablet  3  . traMADol (ULTRAM) 50 MG tablet Take 1 tablet (50 mg total) by mouth every 6 (six) hours as needed. As needed for pain.  90 tablet  11  . BYSTOLIC 10 MG tablet TAKE 1 TABLET BY MOUTH ONCE DAILY  30 tablet  5  . [DISCONTINUED] buPROPion (WELLBUTRIN XL) 150 MG 24 hr tablet Take 1 tablet (150 mg total) by mouth 2 (two) times daily.  60 tablet  5   No current facility-administered medications for this visit.    REVIEW OF SYSTEMS:   Eyes: Denies blurriness of vision Ears, nose, mouth, throat, and face: Denies mucositis or sore throat Cardiovascular: Denies palpitation, chest discomfort or lower extremity swelling Gastrointestinal:  Denies nausea, heartburn or change in bowel habits Skin: Denies abnormal skin rashes Lymphatics: Denies new lymphadenopathy or easy bruising Neurological:Denies numbness, tingling or new weaknesses Behavioral/Psych: Mood is stable, no new changes  All other systems were reviewed with the patient and are negative.  PHYSICAL EXAMINATION: ECOG PERFORMANCE STATUS: 1 - Symptomatic but completely ambulatory  Filed Vitals:   05/03/13 0906  BP: 152/68  Pulse: 95  Temp: 97.8 F (36.6 C)  Resp: 20   Filed Weights   05/03/13 0906  Weight: 152 lb 1.6 oz (68.992 kg)    GENERAL:alert, no distress and comfortable SKIN: skin color, texture, turgor are normal, no rashes or significant lesions EYES: normal, Conjunctiva are pink and non-injected, sclera clear OROPHARYNX:no exudate, no erythema and lips, buccal mucosa, and tongue normal  NECK: supple, thyroid normal size, non-tender, without nodularity LYMPH:  no palpable lymphadenopathy in the cervical, axillary or inguinal LUNGS: Reduced breath sounds on the right lung, with dullness to percussion of 50% of her right lung base HEART: regular rate & rhythm and no murmurs and no lower extremity edema ABDOMEN:abdomen soft, non-tender and normal bowel sounds Musculoskeletal:no cyanosis of digits and no  clubbing  NEURO: alert & oriented x 3 with fluent speech, no focal motor/sensory deficits  LABORATORY DATA:  I have reviewed the data as listed    Component Value Date/Time   NA 145 03/22/2013 0944   NA 143 03/02/2013 0050   NA 143 09/28/2011 0833   K 3.6 03/22/2013 0944   K 3.1* 03/02/2013 0050   K 3.5 09/28/2011 0833   CL 108 03/02/2013 0050   CL 102 10/24/2012 1001   CL 98 09/28/2011 0833   CO2 25 03/22/2013 0944   CO2 25 03/02/2013 0050   CO2 30 09/28/2011 0833   GLUCOSE 99 03/22/2013 0944   GLUCOSE 101* 03/02/2013 0050   GLUCOSE 111* 10/24/2012 1001   GLUCOSE 112 09/28/2011 0833   BUN 6.2* 03/22/2013 0944   BUN 8 03/02/2013 0050   BUN 9 09/28/2011 0833   CREATININE 0.7 03/22/2013 0944   CREATININE 0.63 03/02/2013 0620   CREATININE 0.7 09/28/2011 0833   CALCIUM 9.2 03/22/2013 0944   CALCIUM 8.9 03/02/2013 0050   CALCIUM 8.7 09/28/2011 0833   PROT 6.2* 03/22/2013 0944   PROT 7.2  02/15/2012 1159   PROT 7.2 09/28/2011 0833   ALBUMIN 2.7* 03/22/2013 0944   ALBUMIN 3.4* 02/15/2012 1159   AST 14 03/22/2013 0944   AST 12 02/15/2012 1159   AST 15 09/28/2011 0833   ALT 10 03/22/2013 0944   ALT 5 02/15/2012 1159   ALT 16 09/28/2011 0833   ALKPHOS 121 03/22/2013 0944   ALKPHOS 119* 02/15/2012 1159   ALKPHOS 99* 09/28/2011 0833   BILITOT 0.31 03/22/2013 0944   BILITOT 0.4 02/15/2012 1159   BILITOT 0.80 09/28/2011 0833   GFRNONAA >90 03/02/2013 0620   GFRAA >90 03/02/2013 0620    No results found for this basename: SPEP,  UPEP,   kappa and lambda light chains    Lab Results  Component Value Date   WBC 4.4 05/03/2013   NEUTROABS 3.2 05/03/2013   HGB 11.8 05/03/2013   HCT 38.1 05/03/2013   MCV 80.0 05/03/2013   PLT 356 05/03/2013      Chemistry      Component Value Date/Time   NA 145 03/22/2013 0944   NA 143 03/02/2013 0050   NA 143 09/28/2011 0833   K 3.6 03/22/2013 0944   K 3.1* 03/02/2013 0050   K 3.5 09/28/2011 0833   CL 108 03/02/2013 0050   CL 102 10/24/2012 1001   CL 98 09/28/2011 0833    CO2 25 03/22/2013 0944   CO2 25 03/02/2013 0050   CO2 30 09/28/2011 0833   BUN 6.2* 03/22/2013 0944   BUN 8 03/02/2013 0050   BUN 9 09/28/2011 0833   CREATININE 0.7 03/22/2013 0944   CREATININE 0.63 03/02/2013 0620   CREATININE 0.7 09/28/2011 0833      Component Value Date/Time   CALCIUM 9.2 03/22/2013 0944   CALCIUM 8.9 03/02/2013 0050   CALCIUM 8.7 09/28/2011 0833   ALKPHOS 121 03/22/2013 0944   ALKPHOS 119* 02/15/2012 1159   ALKPHOS 99* 09/28/2011 0833   AST 14 03/22/2013 0944   AST 12 02/15/2012 1159   AST 15 09/28/2011 0833   ALT 10 03/22/2013 0944   ALT 5 02/15/2012 1159   ALT 16 09/28/2011 0833   BILITOT 0.31 03/22/2013 0944   BILITOT 0.4 02/15/2012 1159   BILITOT 0.80 09/28/2011 0833     ASSESSMENT & PLAN:  #1 stage IV lung cancer The patient is experiencing a lot of side effects of profound fatigue. Certainly chemotherapy can cause fatigue. I think the  major problem with her is she continued to smoke. With her ongoing issue of severe shortness of breath and pleural effusion, I recommend stopping the chemotherapy until further workup. I am ordering CT scan of the chest abdomen and pelvis to restage her disease before she return in 1 month #2 recurrent pleural effusion Pathology show no evidence of malignant cells. Certainly it could be due to malignancy and the pathology is just nondiagnostic.  The patient is very symptomatic with shortness of breath. I recommend ordering chest x-ray and repeat ultrasound guided thoracentesis. I recommend referral back to her cardiothoracic surgeon for consideration for pleurodesis #3 chronic fatigue This could be due to increased work of breathing from pleural effusion. I'm stopping chemotherapy and hopefully that could improve her subjective symptoms of fatigue from chemotherapy side effect #4 cigarette smoking I spent a lot of time educating the patient importance of nicotine cessation.  Orders Placed This Encounter  Procedures  . DG Chest 2  View    Standing Status: Future     Number of Occurrences:  Standing Expiration Date: 05/03/2014    Order Specific Question:  Reason for exam:    Answer:  SOB, pleural effusion    Order Specific Question:  Preferred imaging location?    Answer:  [facility]  . IR THORACENTESIS ASP PLEURAL SPACE W/IMG GUIDE    Standing Status: Future     Number of Occurrences:      Standing Expiration Date: 07/04/2014    Order Specific Question:  Reason for Exam (SYMPTOM  OR DIAGNOSIS REQUIRED)    Answer:  pleural effusion    Order Specific Question:  Preferred Imaging Location?    Answer:  [facility]  . CT Chest W Contrast    Standing Status: Future     Number of Occurrences:      Standing Expiration Date: 07/03/2014    Order Specific Question:  Reason for Exam (SYMPTOM  OR DIAGNOSIS REQUIRED)    Answer:  metastatic lung ca, staging    Order Specific Question:  Preferred imaging location?    Answer:  [facility]  . CT Abdomen Pelvis W Contrast    Standing Status: Future     Number of Occurrences:      Standing Expiration Date: 08/03/2014    Order Specific Question:  Reason for Exam (SYMPTOM  OR DIAGNOSIS REQUIRED)    Answer:  staging metastatic lung ca    Order Specific Question:  Preferred imaging location?    Answer:  [facility]  . US Thoracentesis Asp Pleural space w/IMG guide    Standing Status: Future     Number of Occurrences:      Standing Expiration Date: 05/03/2014    Order Specific Question:  Reason for Exam (SYMPTOM  OR DIAGNOSIS REQUIRED)    Answer:  right pleural effusion    Order Specific Question:  Preferred imaging location?    Answer:  [facility]  . Ambulatory referral to Cardiothoracic Surgery    Referral Priority:  Routine    Referral Type:  Surgical    Referral Reason:  Specialty Services Required    Referred to Provider:  Alleen Borne, MD    Requested Specialty:  Cardiothoracic Surgery    Number of Visits Requested:   1   All questions were answered. The patient knows to call the clinic with any problems, questions or concerns. No barriers to learning was detected.   Connery Shiffler, MD 05/03/2013 9:51 AM

## 2013-05-03 NOTE — Progress Notes (Signed)
Patient and daughter present to nutrition followup.  Patient has poor appetite and constipation.  She did not receive her chemotherapy today for her stage IV lung cancer.  Weight decreased to 152.1 pounds December 17, from 159 pounds November 6.  Patient reports she tolerates liquids better than solid foods.  She drinks juices, Carnation Instant Breakfast,  Ensure, water and sodas.  She has an appetite for vegetables and fruit.  Nutrition diagnosis: Unintended weight loss continues.  Intervention: I reviewed easy to tolerate, high-protein foods and snacks for patient to try throughout the day.  I've encouraged her to increase her consumption of oral nutrition supplements and provided her with some oral nutrition supplement samples.  Also provided her with coupons.  Teach back method used.  Patient is agreeable to trying to increase liquid oral supplements.  Monitoring, evaluation, goals: Patient will tolerate increased oral nutrition supplements along with high protein foods to minimize further weight loss.  Next visit: Follow up has not been scheduled however, I am available to work with patient as needed.

## 2013-05-03 NOTE — Telephone Encounter (Signed)
gv and printed papt sched and avs for pt for DEC and Jan 2015...s.w. tcts and they will call me and pt with d.t for appt.Lauren KitchenMarland [name]

## 2013-05-04 ENCOUNTER — Encounter: Payer: Self-pay | Admitting: Surgery

## 2013-05-04 ENCOUNTER — Ambulatory Visit (HOSPITAL_COMMUNITY)
Admission: RE | Admit: 2013-05-04 | Discharge: 2013-05-04 | Disposition: A | Discharge status: Home / Self Care | Payer: Medicare Other | Source: Ambulatory Visit | Attending: Hematology and Oncology | Admitting: Hematology and Oncology

## 2013-05-04 ENCOUNTER — Institutional Professional Consult (permissible substitution) (INDEPENDENT_AMBULATORY_CARE_PROVIDER_SITE_OTHER): Payer: Medicare Other | Admitting: Surgery

## 2013-05-04 ENCOUNTER — Ambulatory Visit (HOSPITAL_COMMUNITY)
Admission: RE | Admit: 2013-05-04 | Discharge: 2013-05-04 | Disposition: A | Discharge status: Home / Self Care | Payer: Medicare Other | Source: Ambulatory Visit | Attending: Radiology | Admitting: Radiology

## 2013-05-04 VITALS — BP 158/73

## 2013-05-04 VITALS — BP 145/86 | HR 88 | Resp 20 | Ht 64.0 in | Wt 152.0 lb

## 2013-05-04 DIAGNOSIS — J90 Pleural effusion, not elsewhere classified: Secondary | ICD-10-CM | POA: Insufficient documentation

## 2013-05-04 DIAGNOSIS — C3490 Malignant neoplasm of unspecified part of unspecified bronchus or lung: Secondary | ICD-10-CM

## 2013-05-04 DIAGNOSIS — Z85038 Personal history of other malignant neoplasm of large intestine: Secondary | ICD-10-CM | POA: Insufficient documentation

## 2013-05-04 NOTE — Progress Notes (Signed)
PCP is Rodman Pickle, MD Referring Provider is Artis Delay, MD  Chief Complaint  Patient presents with  . 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 She underwent left upper lobectomy on 06/16/2010 for a Stage I (T1a, N0, M0) well differentiated adenocarcinoma of the lung. She had previously undergone sigmoid colectomy in 2008 for a Stage I adenocarcinoma of the colon. She developed a brain met in 01/2012 felt to be secondary to her lung cancer and underwent stereotactic radiosurgery followed by chemotherapy. She developed a right pleural effusion in October 2014 and had a thoracentesis removing 1.2 L of fluid that had suspicious atypical cell but not diagnostic of cancer. She was seen yesterday for followup by Dr. Bertis Ruddy and noted to have a recurrent right pleural effusion on exam, confirmed with CXR. My office was called for an urgent appointment to evaluate for a pleurX catheter and she was scheduled for today. She says she has had mild shortness of breath lately. She was scheduled for a right thoracentesis by Dr. Bertis Ruddy and had that done today by IR removing 1.3 L of pleural fluid. Cytology is pending.  Past Medical History  Diagnosis Date  . Depression   . Hyperlipidemia   . Hypertension   . Lung nodule     FNA ordered for 04/02/10 by HA>pos Ca  . Chronic folliculitis     of groin  . Fibrocystic breast changes   . Family history of trichomonal vaginitis 05/2005  . Postmenopausal   . Depressive disorder   . Hypercholesterolemia   . GERD (gastroesophageal reflux disease)   . Cerebral aneurysm   . Back pain   . Shortness of breath   . COPD (chronic obstructive pulmonary disease)   . Coronary artery disease   . Arthritis   . Weight loss 10/22/2011  . Status post radiation  therapy 11/16/11 - 12/29/11    Right Lung and Mediastinum: 60 Gy  . Status post chemotherapy comp. 12/29/11    Carboplatin/Taxol  . Colon cancer 11/08  . Lung cancer 06/16/10    PET scan 04/28/2010; primary: increase in size 02/2010 / Well Differentiated Adenocarcinoma of the lung   . Brain metastases 02/15/12  . S/P radiation therapy 03/01/12    SRS: 1 fraction / 20 Gray each to the Left Occipital Region and to the Right Insular Metastases  . On antineoplastic chemotherapy started 02/2012    Alimta  . Fatigue 02/06/2013  . Pleural effusion 05/03/2013    Past Surgical History  Procedure Laterality Date  . Colectomy  03/22/07    Stage 1 pT2 N0, M0 Adenocarcinoma of the sigmoid  colon  . Tubal ligation    . Lung lobectomy  06/16/10    Left Upper Lobectomy  . Hernia repair    . Breast surgery      Bil lumpectomy  . Back surgery    . Mediastinoscopy  10/19/2011    Procedure: MEDIASTINOSCOPY;  Surgeon: Alleen Borne, MD;  Location: [facility] OR;  Service: Thoracic;  Laterality: N/A;  . Cardiac cath x3      Family History  Problem Relation Age of Onset  . Stomach cancer Maternal Aunt   . Breast cancer Cousin   . Cancer Sister     Lymphatic  . Osteoarthritis Father   .  Gout Father   . Hypertension Father   . Heart disease Mother     pericarditis;   . Anesthesia problems Neg Hx     Social History History  Substance Use Topics  . Smoking status: Current Every Day Smoker -- 1.00 packs/day    Types: Cigarettes  . Smokeless tobacco: Current User  . Alcohol Use: 0.0 oz/week     Comment: occasional    Current Outpatient Prescriptions  Medication Sig Dispense Refill  . albuterol (PROVENTIL HFA;VENTOLIN HFA) 108 (90 BASE) MCG/ACT inhaler Inhale 2 puffs into the lungs every 6 (six) hours as needed. For shortness of breath  2 Inhaler  11  . amoxicillin (AMOXIL) 500 MG capsule Take 2 capsules (1,000 mg total) by mouth 2 (two) times daily.  40 capsule  0  . aspirin 81 MG EC tablet Take 81 mg  by mouth every morning.       . benzonatate (TESSALON) 200 MG capsule Take 1 capsule (200 mg total) by mouth 2 (two) times daily as needed for cough.  60 capsule  3  . BYSTOLIC 10 MG tablet TAKE 1 TABLET BY MOUTH ONCE DAILY  30 tablet  5  . cloNIDine (CATAPRES) 0.1 MG tablet Take 1 tablet (0.1 mg total) by mouth 2 (two) times daily.  60 tablet  5  . diclofenac sodium (VOLTAREN) 1 % GEL Apply 2 g topically 4 (four) times daily. As needed for pain  100 g  11  . folic acid (FOLVITE) 1 MG tablet TAKE 1 TABLET BY MOUTH ONCE DAILY  30 tablet  5  . lidocaine-prilocaine (EMLA) cream Apply topically as needed. Apply to porta cath site one hour prior to needle stick.  30 g  2  . nitroGLYCERIN (NITROSTAT) 0.4 MG SL tablet Place 1 tablet (0.4 mg total) under the tongue every 5 (five) minutes as needed. For chest pain  10 tablet  0  . omeprazole (PRILOSEC) 40 MG capsule Take 1 capsule (40 mg total) by mouth daily.  90 capsule  2  . potassium chloride SA (KLOR-CON M20) 20 MEQ tablet Take 0.5 tablets (10 mEq total) by mouth daily.  90 tablet  3  . pravastatin (PRAVACHOL) 40 MG tablet Take 1 tablet (40 mg total) by mouth daily.  30 tablet  5  . tiZANidine (ZANAFLEX) 2 MG tablet Take 1 tablet (2 mg total) by mouth every 6 (six) hours as needed (Back Muscle Spasm).  30 tablet  3  . traMADol (ULTRAM) 50 MG tablet Take 1 tablet (50 mg total) by mouth every 6 (six) hours as needed. As needed for pain.  90 tablet  11  . [DISCONTINUED] buPROPion (WELLBUTRIN XL) 150 MG 24 hr tablet Take 1 tablet (150 mg total) by mouth 2 (two) times daily.  60 tablet  5   No current facility-administered medications for this visit.    Allergies  Allergen Reactions  . Lisinopril Cough    Review of Systems  Constitutional: Positive for activity change, appetite change, fatigue and unexpected weight change. Negative for fever and chills.  HENT: Negative.   Eyes: Negative.   Respiratory: Positive for cough and shortness of breath.     Cardiovascular: Negative.   Gastrointestinal: Negative.   Endocrine: Negative.   Genitourinary: Negative.   Musculoskeletal: Positive for arthralgias and myalgias.  Skin: Negative.   Allergic/Immunologic: Negative.   Hematological: Negative.  Negative for adenopathy.  Psychiatric/Behavioral: Negative.     BP 145/86  Pulse 88  Resp 20  Ht  5\' 4"  (1.626 m)  Wt 152 lb (68.947 kg)  BMI 26.08 kg/m2  SpO2 96% Physical Exam  Constitutional: She is oriented to person, place, and time. She appears well-developed and well-nourished. No distress.  HENT:  Head: Normocephalic and atraumatic.  Mouth/Throat: Oropharynx is clear and moist.  Eyes: EOM are normal. Pupils are equal, round, and reactive to light.  Neck: Normal range of motion. Neck supple. No JVD present. No thyromegaly present.  Cardiovascular: Normal rate, regular rhythm and normal heart sounds.   No murmur heard. Pulmonary/Chest: Effort normal.  Decreased breath sounds over the right base.  Abdominal: Soft. Bowel sounds are normal. She exhibits no distension and no mass. There is no tenderness.  Musculoskeletal: She exhibits no edema.  Lymphadenopathy:    She has no cervical adenopathy.  Neurological: She is alert and oriented to person, place, and time. She has normal strength. No cranial nerve deficit or sensory deficit.  Skin: Skin is warm and dry.  Psychiatric: She has a normal mood and affect.     Impression:  Recurrent right pleural effusion that is suspicious for a malignant effusion in this patient with metastatic lung cancer to the brain. I had planned on inserting a pleurx catheter tomorrow but since she had the effusion drained with thoracentesis today we will have to wait for the effusion to recur before inserting a catheter. She should probably have a cxr done in a couple weeks to assess this. I will leave that decision up to her oncologist, Dr. Bertis Ruddy. I will be happy to insert a pleurx if and when the  effusion recurs to the moderate range and this can be scheduled quickly so that she does not need another thoracentesis. I discussed the procedure and benefits/risks with the patient and her family and she understands and agrees with this plan. I told her that if she develops recurrent shortness of breath that she should call us to have a CXR done immediately.  Plan:  Will insert a right pleurx catheter if the effusion recurs.

## 2013-05-04 NOTE — Procedures (Signed)
US guided diagnostic/therapeutic right thoracentesis performed yielding 1.3 liters turbid, blood-tinged fluid. The fluid was sent to the lab for cytology and culture. F/u CXR pending. The pt did have a mild vasovagal reaction during procedure. Her vitals signs remained stable and she remained alert and oriented. The procedure was completed with pt lying down with right side up. Cool compresses were applied to her forehead and water was given to her.

## 2013-05-07 LAB — BODY FLUID CULTURE: Special Requests: NORMAL

## 2013-05-09 ENCOUNTER — Other Ambulatory Visit: Payer: Self-pay | Admitting: *Deleted

## 2013-05-09 ENCOUNTER — Telehealth: Payer: Self-pay | Admitting: Hematology and Oncology

## 2013-05-09 NOTE — Telephone Encounter (Signed)
appt moved per 12/23 POF Pt aware shh

## 2013-05-18 DIAGNOSIS — J910 Malignant pleural effusion: Secondary | ICD-10-CM

## 2013-05-18 HISTORY — DX: Malignant pleural effusion: J91.0

## 2013-05-26 ENCOUNTER — Other Ambulatory Visit: Payer: Self-pay | Admitting: *Deleted

## 2013-05-26 ENCOUNTER — Telehealth: Payer: Self-pay | Admitting: Hematology and Oncology

## 2013-05-26 NOTE — Telephone Encounter (Signed)
added flush appt for 1/12 per 1/9 pof. s/w pt re add on and new time for lb/flush 1/12 @ 10am due to pt needing to be at [facility] to drink prep @ 11am.

## 2013-05-29 ENCOUNTER — Ambulatory Visit: Payer: Commercial Managed Care - HMO

## 2013-05-29 ENCOUNTER — Other Ambulatory Visit (HOSPITAL_BASED_OUTPATIENT_CLINIC_OR_DEPARTMENT_OTHER): Payer: Medicare HMO

## 2013-05-29 ENCOUNTER — Encounter (HOSPITAL_COMMUNITY): Payer: Self-pay

## 2013-05-29 ENCOUNTER — Telehealth: Payer: Self-pay | Admitting: *Deleted

## 2013-05-29 ENCOUNTER — Other Ambulatory Visit: Payer: Self-pay

## 2013-05-29 ENCOUNTER — Ambulatory Visit (HOSPITAL_COMMUNITY)
Admission: RE | Admit: 2013-05-29 | Discharge: 2013-05-29 | Disposition: A | Discharge status: Home / Self Care | Payer: Medicare HMO | Source: Ambulatory Visit | Attending: Hematology and Oncology | Admitting: Hematology and Oncology

## 2013-05-29 DIAGNOSIS — C3490 Malignant neoplasm of unspecified part of unspecified bronchus or lung: Secondary | ICD-10-CM | POA: Insufficient documentation

## 2013-05-29 DIAGNOSIS — J90 Pleural effusion, not elsewhere classified: Secondary | ICD-10-CM | POA: Insufficient documentation

## 2013-05-29 DIAGNOSIS — C3410 Malignant neoplasm of upper lobe, unspecified bronchus or lung: Secondary | ICD-10-CM

## 2013-05-29 DIAGNOSIS — M47817 Spondylosis without myelopathy or radiculopathy, lumbosacral region: Secondary | ICD-10-CM | POA: Insufficient documentation

## 2013-05-29 DIAGNOSIS — E876 Hypokalemia: Secondary | ICD-10-CM

## 2013-05-29 DIAGNOSIS — E041 Nontoxic single thyroid nodule: Secondary | ICD-10-CM | POA: Insufficient documentation

## 2013-05-29 DIAGNOSIS — I700 Atherosclerosis of aorta: Secondary | ICD-10-CM | POA: Insufficient documentation

## 2013-05-29 LAB — COMPREHENSIVE METABOLIC PANEL (CC13)
ALT: 11 U/L (ref 0–55)
ANION GAP: 9 meq/L (ref 3–11)
AST: 11 U/L (ref 5–34)
Albumin: 2.3 g/dL — ABNORMAL LOW (ref 3.5–5.0)
Alkaline Phosphatase: 84 U/L (ref 40–150)
BILIRUBIN TOTAL: 0.51 mg/dL (ref 0.20–1.20)
BUN: 4.4 mg/dL — AB (ref 7.0–26.0)
CO2: 24 mEq/L (ref 22–29)
Calcium: 7.5 mg/dL — ABNORMAL LOW (ref 8.4–10.4)
Chloride: 114 mEq/L — ABNORMAL HIGH (ref 98–109)
Creatinine: 0.6 mg/dL (ref 0.6–1.1)
GLUCOSE: 88 mg/dL (ref 70–140)
Potassium: 2.9 mEq/L — CL (ref 3.5–5.1)
Sodium: 146 mEq/L — ABNORMAL HIGH (ref 136–145)
Total Protein: 5.1 g/dL — ABNORMAL LOW (ref 6.4–8.3)

## 2013-05-29 LAB — CBC WITH DIFFERENTIAL/PLATELET
BASO%: 0.5 % (ref 0.0–2.0)
BASOS ABS: 0 10*3/uL (ref 0.0–0.1)
EOS%: 1.6 % (ref 0.0–7.0)
Eosinophils Absolute: 0.1 10*3/uL (ref 0.0–0.5)
HEMATOCRIT: 33.1 % — AB (ref 34.8–46.6)
HEMOGLOBIN: 10.6 g/dL — AB (ref 11.6–15.9)
LYMPH#: 0.7 10*3/uL — AB (ref 0.9–3.3)
LYMPH%: 14 % (ref 14.0–49.7)
MCH: 24.9 pg — ABNORMAL LOW (ref 25.1–34.0)
MCHC: 32.1 g/dL (ref 31.5–36.0)
MCV: 77.7 fL — ABNORMAL LOW (ref 79.5–101.0)
MONO#: 0.4 10*3/uL (ref 0.1–0.9)
MONO%: 8.2 % (ref 0.0–14.0)
NEUT#: 3.7 10*3/uL (ref 1.5–6.5)
NEUT%: 75.7 % (ref 38.4–76.8)
Platelets: 169 10*3/uL (ref 145–400)
RBC: 4.26 10*6/uL (ref 3.70–5.45)
RDW: 19.5 % — ABNORMAL HIGH (ref 11.2–14.5)
WBC: 4.8 10*3/uL (ref 3.9–10.3)

## 2013-05-29 MED ORDER — IOHEXOL 300 MG/ML  SOLN
100.0000 mL | Freq: Once | INTRAMUSCULAR | Status: AC | PRN
  Administered 2013-05-29: 100 mL via INTRAVENOUS

## 2013-05-29 MED ORDER — SODIUM CHLORIDE 0.9 % IJ SOLN
10.0000 mL | INTRAMUSCULAR | Status: DC | PRN
  Administered 2013-05-29: 10 mL via INTRAVENOUS
  Filled 2013-05-29: qty 10

## 2013-05-29 MED ORDER — HEPARIN SOD (PORK) LOCK FLUSH 100 UNIT/ML IV SOLN
500.0000 [IU] | Freq: Once | INTRAVENOUS | Status: AC
Start: 2013-05-29 — End: 2013-05-29
  Administered 2013-05-29: 500 [IU] via INTRAVENOUS
  Filled 2013-05-29: qty 5

## 2013-05-29 MED ORDER — MAGNESIUM OXIDE 400 (241.3 MG) MG PO TABS: 400.0000 mg | ORAL_TABLET | Freq: Every day | ORAL | Status: DC

## 2013-05-29 MED ORDER — IOHEXOL 300 MG/ML  SOLN
50.0000 mL | Freq: Once | INTRAMUSCULAR | Status: AC | PRN
  Administered 2013-05-29: 50 mL via ORAL

## 2013-05-29 NOTE — Patient Instructions (Signed)

## 2013-05-29 NOTE — Telephone Encounter (Signed)
Informed pt of low potassium level and she states she had stopped taking it because she didn't think she needed it any more.  Instructed her to take 20 Meq once daily and Magnesium 400 mg daily.  Rx for Magnesium sent to [facility].  Pt verbalized understanding.

## 2013-05-31 ENCOUNTER — Telehealth: Payer: Self-pay | Admitting: Family Medicine

## 2013-05-31 ENCOUNTER — Encounter: Payer: Self-pay | Admitting: Hematology and Oncology

## 2013-05-31 ENCOUNTER — Other Ambulatory Visit: Payer: Self-pay

## 2013-05-31 ENCOUNTER — Telehealth: Payer: Self-pay | Admitting: Hematology and Oncology

## 2013-05-31 ENCOUNTER — Ambulatory Visit (HOSPITAL_BASED_OUTPATIENT_CLINIC_OR_DEPARTMENT_OTHER): Payer: Medicare HMO | Admitting: Hematology and Oncology

## 2013-05-31 VITALS — BP 152/84 | HR 82 | Temp 98.2°F | Resp 18 | Ht 64.0 in | Wt 150.6 lb

## 2013-05-31 DIAGNOSIS — C3490 Malignant neoplasm of unspecified part of unspecified bronchus or lung: Secondary | ICD-10-CM

## 2013-05-31 DIAGNOSIS — C189 Malignant neoplasm of colon, unspecified: Secondary | ICD-10-CM

## 2013-05-31 DIAGNOSIS — R5381 Other malaise: Secondary | ICD-10-CM

## 2013-05-31 DIAGNOSIS — F17200 Nicotine dependence, unspecified, uncomplicated: Secondary | ICD-10-CM

## 2013-05-31 DIAGNOSIS — Z85038 Personal history of other malignant neoplasm of large intestine: Secondary | ICD-10-CM

## 2013-05-31 DIAGNOSIS — C7931 Secondary malignant neoplasm of brain: Secondary | ICD-10-CM

## 2013-05-31 DIAGNOSIS — J90 Pleural effusion, not elsewhere classified: Secondary | ICD-10-CM

## 2013-05-31 DIAGNOSIS — D649 Anemia, unspecified: Secondary | ICD-10-CM

## 2013-05-31 DIAGNOSIS — R05 Cough: Secondary | ICD-10-CM

## 2013-05-31 DIAGNOSIS — E876 Hypokalemia: Secondary | ICD-10-CM

## 2013-05-31 DIAGNOSIS — R059 Cough, unspecified: Secondary | ICD-10-CM

## 2013-05-31 DIAGNOSIS — C3410 Malignant neoplasm of upper lobe, unspecified bronchus or lung: Secondary | ICD-10-CM

## 2013-05-31 DIAGNOSIS — C7949 Secondary malignant neoplasm of other parts of nervous system: Secondary | ICD-10-CM

## 2013-05-31 DIAGNOSIS — R5383 Other fatigue: Secondary | ICD-10-CM

## 2013-05-31 DIAGNOSIS — G4700 Insomnia, unspecified: Secondary | ICD-10-CM

## 2013-05-31 DIAGNOSIS — R0602 Shortness of breath: Secondary | ICD-10-CM

## 2013-05-31 HISTORY — DX: Personal history of other malignant neoplasm of large intestine: Z85.038

## 2013-05-31 NOTE — Telephone Encounter (Signed)
Gave pt appt for lab, md and chemo for March 2015

## 2013-05-31 NOTE — Telephone Encounter (Signed)
[facility]. Has switched insurance to [place]. Was told to contact PCP and request a referral to the [place] Please advise

## 2013-05-31 NOTE — Progress Notes (Signed)
[place] OFFICE PROGRESS NOTE  Patient Care Team: [name], MD as PCP - General (Family Medicine) Gaye Pollack, MD (Cardiothoracic Surgery) Thea Silversmith, MD (Radiation Oncology) Dickie La, MD (Family Medicine) Heath Lark, MD as Consulting Physician (Hematology and Oncology)  DIAGNOSIS: Stage IV lung cancer, for further management  SUMMARY OF ONCOLOGIC HISTORY: Oncology History   Colon cancer   Primary site: Colon and Rectum (Left)   Staging method: AJCC 7th Edition   Clinical: Stage I (T2, N0, M0) signed by Heath Lark, MD on 05/31/2013  2:39 PM   Pathologic: Stage I (T2, N0, cM0) signed by Heath Lark, MD on 05/31/2013  2:39 PM   Summary: Stage I (T2, N0, cM0) Lung cancer, EGFR/ALK negative, recurrence after initial resection to LN and brain   Primary site: Lung (Left)   Staging method: AJCC 7th Edition   Clinical: Stage IV (T1, N2, M1b) signed by Heath Lark, MD on 05/31/2013  2:26 PM   Pathologic: Stage IV (T1, N2, M1b) signed by Heath Lark, MD on 05/31/2013  2:26 PM   Summary: Stage IV (T1, N2, M1b)       Lung cancer   03/01/2007 Procedure Colonoscopy revealed abnormalities and biopsy show high-grade dysplasia   04/01/2007 Surgery She underwent sigmoid resection which showed T2 N0 colon cancer, and negative margins and all of 17 lymph nodes were negative   02/29/2008 Procedure Repeat surveillance colonoscopy was negative.   06/16/2010 Surgery She underwent left upper lobectomy we show well-differentiated adenocarcinoma of the lung, T1, N0, M0   03/18/2011 Procedure Repeat colonoscopy show multiple polyps but there were benign   10/19/2011 Procedure Biopsy of mediastinal lymph node came back positive for recurrence of lung cancer, EGFR and ALK negative   11/16/2011 - 12/14/2011 Chemotherapy She received concurrent chemoradiation therapy with weekly carboplatin and Taxol.   11/16/2011 - 12/29/2011 Radiation Therapy She received radiation therapy with weekly  chemotherapy   02/15/2012 Imaging MR of the brain showed a new intracranial metastases. This was subsequently treated with stereotactic radiosurgery.   03/07/2012 - 04/12/2013 Chemotherapy She received chemotherapy with maintainence Alimta every 3 weeks. Chemotherapy was discontinued due to profound fatigue   03/02/2013 Procedure She had therapeutic ultrasound guidance thoracentesis for pleural effusion that came back negative for cancer   05/04/2013 Procedure She had repeat ultrasound-guided thoracentesis again and cytology was negative   05/29/2013 Imaging Repeat CT scan of the chest, abdomen and pelvis show no evidence of disease but persistent right-sided pleural effusion    INTERVAL HISTORY: Lauren [name] 63 y.o. female returns for further followup. Since I last saw her, she is currently off chemotherapy for almost 2 months. Her energy level has improved. Her appetite is better and stable. She still has ongoing cough and congestion. No recent hemoptysis. She also complained of insomnia. She continued to smoke a pack of cigarettes a day and not willing to quit  I have reviewed the past medical history, past surgical history, social history and family history with the patient and they are unchanged from previous note.  ALLERGIES:  is allergic to lisinopril.  MEDICATIONS:  Current Outpatient Prescriptions  Medication Sig Dispense Refill  . albuterol (PROVENTIL HFA;VENTOLIN HFA) 108 (90 BASE) MCG/ACT inhaler Inhale 2 puffs into the lungs every 6 (six) hours as needed. For shortness of breath  2 Inhaler  11  . aspirin 81 MG EC tablet Take 81 mg by mouth every morning.       . benzonatate (TESSALON)  200 MG capsule Take 1 capsule (200 mg total) by mouth 2 (two) times daily as needed for cough.  60 capsule  3  . BYSTOLIC 10 MG tablet TAKE 1 TABLET BY MOUTH ONCE DAILY  30 tablet  5  . cloNIDine (CATAPRES) 0.1 MG tablet Take 1 tablet (0.1 mg total) by mouth 2 (two) times daily.  60 tablet  5   . diclofenac sodium (VOLTAREN) 1 % GEL Apply 2 g topically 4 (four) times daily. As needed for pain  099 g  11  . folic acid (FOLVITE) 1 MG tablet TAKE 1 TABLET BY MOUTH ONCE DAILY  30 tablet  5  . lidocaine-prilocaine (EMLA) cream Apply topically as needed. Apply to porta cath site one hour prior to needle stick.  30 g  2  . magnesium oxide (MAG-OX) 400 (241.3 MG) MG tablet Take 1 tablet (400 mg total) by mouth daily.  30 tablet  1  . nitroGLYCERIN (NITROSTAT) 0.4 MG SL tablet Place 1 tablet (0.4 mg total) under the tongue every 5 (five) minutes as needed. For chest pain  10 tablet  0  . omeprazole (PRILOSEC) 40 MG capsule Take 1 capsule (40 mg total) by mouth daily.  90 capsule  2  . potassium chloride SA (K-DUR,KLOR-CON) 20 MEQ tablet Take 20 mEq by mouth daily.      . pravastatin (PRAVACHOL) 40 MG tablet Take 1 tablet (40 mg total) by mouth daily.  30 tablet  5  . tiZANidine (ZANAFLEX) 2 MG tablet Take 1 tablet (2 mg total) by mouth every 6 (six) hours as needed (Back Muscle Spasm).  30 tablet  3  . traMADol (ULTRAM) 50 MG tablet Take 1 tablet (50 mg total) by mouth every 6 (six) hours as needed. As needed for pain.  90 tablet  11  . [DISCONTINUED] buPROPion (WELLBUTRIN XL) 150 MG 24 hr tablet Take 1 tablet (150 mg total) by mouth 2 (two) times daily.  60 tablet  5   No current facility-administered medications for this visit.    REVIEW OF SYSTEMS:   Constitutional: Denies fevers, chills or abnormal weight loss Eyes: Denies blurriness of vision Ears, nose, mouth, throat, and face: Denies mucositis or sore throat Cardiovascular: Denies palpitation, chest discomfort or lower extremity swelling Gastrointestinal:  Denies nausea, heartburn or change in bowel habits Skin: Denies abnormal skin rashes Lymphatics: Denies new lymphadenopathy or easy bruising Neurological:Denies numbness, tingling or new weaknesses Behavioral/Psych: Mood is stable, no new changes  All other systems were reviewed  with the patient and are negative.  PHYSICAL EXAMINATION: ECOG PERFORMANCE STATUS: 1 - Symptomatic but completely ambulatory  Filed Vitals:   05/31/13 1308  BP: 152/84  Pulse: 82  Temp: 98.2 F (36.8 C)  Resp: 18   Filed Weights   05/31/13 1308  Weight: 150 lb 9.6 oz (68.312 kg)    GENERAL:alert, no distress and comfortable SKIN: skin color, texture, turgor are normal, no rashes or significant lesions EYES: normal, Conjunctiva are pink and non-injected, sclera clear OROPHARYNX:no exudate, no erythema and lips, buccal mucosa, and tongue normal  NECK: supple, thyroid normal size, non-tender, without nodularity LYMPH:  no palpable lymphadenopathy in the cervical, axillary or inguinal LUNGS: Reduced breath sounds on the right with normal breathing effort HEART: regular rate & rhythm and no murmurs and no lower extremity edema ABDOMEN:abdomen soft, non-tender and normal bowel sounds Musculoskeletal:no cyanosis of digits and no clubbing  NEURO: alert & oriented x 3 with fluent speech, no focal motor/sensory deficits  LABORATORY DATA:  I have reviewed the data as listed    Component Value Date/Time   NA 146* 05/29/2013 0926   NA 143 03/02/2013 0050   NA 143 09/28/2011 0833   K 2.9 Repeated and Verified* 05/29/2013 0926   K 3.1* 03/02/2013 0050   K 3.5 09/28/2011 0833   CL 108 03/02/2013 0050   CL 102 10/24/2012 1001   CL 98 09/28/2011 0833   CO2 24 05/29/2013 0926   CO2 25 03/02/2013 0050   CO2 30 09/28/2011 0833   GLUCOSE 88 05/29/2013 0926   GLUCOSE 101* 03/02/2013 0050   GLUCOSE 111* 10/24/2012 1001   GLUCOSE 112 09/28/2011 0833   BUN 4.4* 05/29/2013 0926   BUN 8 03/02/2013 0050   BUN 9 09/28/2011 0833   CREATININE 0.6 05/29/2013 0926   CREATININE 0.63 03/02/2013 0620   CREATININE 0.7 09/28/2011 0833   CALCIUM 7.5* 05/29/2013 0926   CALCIUM 8.9 03/02/2013 0050   CALCIUM 8.7 09/28/2011 0833   PROT 5.1* 05/29/2013 0926   PROT 7.2 02/15/2012 1159   PROT 7.2 09/28/2011 0833   ALBUMIN  2.3* 05/29/2013 0926   ALBUMIN 3.4* 02/15/2012 1159   AST 11 05/29/2013 0926   AST 12 02/15/2012 1159   AST 15 09/28/2011 0833   ALT 11 05/29/2013 0926   ALT 5 02/15/2012 1159   ALT 16 09/28/2011 0833   ALKPHOS 84 05/29/2013 0926   ALKPHOS 119* 02/15/2012 1159   ALKPHOS 99* 09/28/2011 0833   BILITOT 0.51 05/29/2013 0926   BILITOT 0.4 02/15/2012 1159   BILITOT 0.80 09/28/2011 0833   GFRNONAA >90 03/02/2013 0620   GFRAA >90 03/02/2013 0620    No results found for this basename: SPEP,  UPEP,   kappa and lambda light chains    Lab Results  Component Value Date   WBC 4.8 05/29/2013   NEUTROABS 3.7 05/29/2013   HGB 10.6* 05/29/2013   HCT 33.1* 05/29/2013   MCV 77.7* 05/29/2013   PLT 169 05/29/2013      Chemistry      Component Value Date/Time   NA 146* 05/29/2013 0926   NA 143 03/02/2013 0050   NA 143 09/28/2011 0833   K 2.9 Repeated and Verified* 05/29/2013 0926   K 3.1* 03/02/2013 0050   K 3.5 09/28/2011 0833   CL 108 03/02/2013 0050   CL 102 10/24/2012 1001   CL 98 09/28/2011 0833   CO2 24 05/29/2013 0926   CO2 25 03/02/2013 0050   CO2 30 09/28/2011 0833   BUN 4.4* 05/29/2013 0926   BUN 8 03/02/2013 0050   BUN 9 09/28/2011 0833   CREATININE 0.6 05/29/2013 0926   CREATININE 0.63 03/02/2013 0620   CREATININE 0.7 09/28/2011 0833      Component Value Date/Time   CALCIUM 7.5* 05/29/2013 0926   CALCIUM 8.9 03/02/2013 0050   CALCIUM 8.7 09/28/2011 0833   ALKPHOS 84 05/29/2013 0926   ALKPHOS 119* 02/15/2012 1159   ALKPHOS 99* 09/28/2011 0833   AST 11 05/29/2013 0926   AST 12 02/15/2012 1159   AST 15 09/28/2011 0833   ALT 11 05/29/2013 0926   ALT 5 02/15/2012 1159   ALT 16 09/28/2011 0833   BILITOT 0.51 05/29/2013 0926   BILITOT 0.4 02/15/2012 1159   BILITOT 0.80 09/28/2011 0833      RADIOGRAPHIC STUDIES: I reviewed the CT scan of the chest abdomen and pelvis which show persistent right-sided pleural effusion but no convincing evidence of recurrence of disease I have personally  reviewed the  radiological images as listed and agreed with the findings in the report.   ASSESSMENT & PLAN:  #1 stage IV lung cancer The patient is experiencing a lot of side effects of profound fatigue. Certainly chemotherapy can cause fatigue. I think the  major problem with her is she continued to smoke. With her ongoing issue of severe shortness of breath and pleural effusion, I recommend we continue on chemo holiday  I will see her in 2 months. Her port has been flushed this week. #2 recurrent pleural effusion Pathology show no evidence of malignant cells. Certainly it could be due to malignancy and the pathology is just nondiagnostic.  The patient is very symptomatic with shortness of breath. The patient declined repeat ultrasound guided thoracentesis. She is willing to go back to see her cardiothoracic surgeon follow further management. #3 chronic fatigue This could be due to increased work of breathing from pleural effusion. It is actually better since I saw her this we stopped treatment #4 cigarette smoking I spent a lot of time educating the patient importance of nicotine cessation. #5 severe hypokalemia Causes unknown. I recommend magnesium supplement as well as potassium supplements. We will recheck with the next visit #6 insomnia I recommend she take Zanaflex at nighttime or over-the-counter Benadryl at nighttime. #7 anemia This is likely anemia of chronic disease. The patient denies recent history of bleeding such as epistaxis, hematuria or hematochezia. She is asymptomatic from the anemia. We will observe for now.  She does not require transfusion now.   Orders Placed This Encounter  Procedures  . Comprehensive metabolic panel    Standing Status: Future     Number of Occurrences:      Standing Expiration Date: 05/31/2014  . CBC with Differential    Standing Status: Future     Number of Occurrences:      Standing Expiration Date: 02/20/2014  . Magnesium    Standing Status: Future      Number of Occurrences:      Standing Expiration Date: 05/31/2014   All questions were answered. The patient knows to call the clinic with any problems, questions or concerns. No barriers to learning was detected.    [facility], [place], MD 05/31/2013 3:01 PM

## 2013-05-31 NOTE — Telephone Encounter (Signed)
Will fwd to PCP for referral.  Lazaro Arms, CMA

## 2013-06-01 NOTE — Telephone Encounter (Signed)
Referral for Oncology ordered.  Thank you, Lizbet Cirrincione M. Denaya Horn, M.D.

## 2013-06-05 ENCOUNTER — Other Ambulatory Visit: Payer: Self-pay | Admitting: *Deleted

## 2013-06-05 DIAGNOSIS — J90 Pleural effusion, not elsewhere classified: Secondary | ICD-10-CM

## 2013-06-07 ENCOUNTER — Ambulatory Visit
Admission: RE | Admit: 2013-06-07 | Discharge: 2013-06-07 | Disposition: A | Discharge status: Home / Self Care | Payer: Commercial Managed Care - HMO | Source: Ambulatory Visit | Attending: Cardiothoracic Surgery | Admitting: Cardiothoracic Surgery

## 2013-06-07 ENCOUNTER — Other Ambulatory Visit: Payer: Self-pay | Admitting: *Deleted

## 2013-06-07 ENCOUNTER — Encounter: Payer: Self-pay | Admitting: Surgery

## 2013-06-07 ENCOUNTER — Ambulatory Visit (INDEPENDENT_AMBULATORY_CARE_PROVIDER_SITE_OTHER): Payer: Medicare HMO | Admitting: Surgery

## 2013-06-07 VITALS — BP 159/88 | HR 100 | Resp 18 | Ht 64.0 in | Wt 150.0 lb

## 2013-06-07 DIAGNOSIS — Z85118 Personal history of other malignant neoplasm of bronchus and lung: Secondary | ICD-10-CM

## 2013-06-07 DIAGNOSIS — J90 Pleural effusion, not elsewhere classified: Secondary | ICD-10-CM

## 2013-06-08 ENCOUNTER — Encounter: Payer: Self-pay | Admitting: Surgery

## 2013-06-08 ENCOUNTER — Encounter (HOSPITAL_COMMUNITY): Payer: Self-pay

## 2013-06-08 NOTE — Progress Notes (Signed)
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 She underwent left upper lobectomy on 06/16/2010 for a Stage I (T1a, N0, M0) well differentiated adenocarcinoma of the lung. She had previously undergone sigmoid colectomy in 2008 for a Stage I adenocarcinoma of the colon. She developed a brain met in 01/2012 felt to be secondary to her lung cancer and underwent stereotactic radiosurgery followed by chemotherapy. She developed a right pleural effusion in October 2014 and had a thoracentesis removing 1.2 L of fluid that had suspicious atypical cell but not diagnostic of cancer. She developed recurrent pleural effusion and a second thoracentesis drained 1.3 L of fluid that also had negative cytology. She now presents with recurrence of the right pleural effusion with shortness of breath and cough.    Current Outpatient Prescriptions  Medication Sig Dispense Refill  . benzonatate (TESSALON) 200 MG capsule Take 1 capsule (200 mg total) by mouth 2 (two) times daily as needed for cough.  60 capsule  3  . cloNIDine (CATAPRES) 0.1 MG tablet Take 1 tablet (0.1 mg total) by mouth 2 (two) times daily.  60 tablet  5  . diclofenac sodium (VOLTAREN) 1 % GEL Apply 2 g topically 4 (four) times daily. As needed for pain  100 g  11  . lidocaine-prilocaine (EMLA) cream Apply topically as needed. Apply to porta cath site one hour prior to needle stick.  30 g  2  . magnesium oxide (MAG-OX) 400 (241.3 MG) MG tablet Take 1 tablet (400 mg total) by mouth daily.  30 tablet  1  . nitroGLYCERIN (NITROSTAT) 0.4 MG SL tablet Place 1 tablet (0.4 mg total) under the tongue every 5 (five) minutes as needed. For chest pain  10 tablet  0  . omeprazole (PRILOSEC) 40 MG capsule Take 1 capsule (40 mg total) by mouth daily.  90 capsule  2  . potassium chloride SA (K-DUR,KLOR-CON) 20 MEQ tablet Take 20 mEq  by mouth daily.      . pravastatin (PRAVACHOL) 40 MG tablet Take 1 tablet (40 mg total) by mouth daily.  30 tablet  5  . tiZANidine (ZANAFLEX) 2 MG tablet Take 1 tablet (2 mg total) by mouth every 6 (six) hours as needed (Back Muscle Spasm).  30 tablet  3  . traMADol (ULTRAM) 50 MG tablet Take 1 tablet (50 mg total) by mouth every 6 (six) hours as needed. As needed for pain.  90 tablet  11  . albuterol (PROVENTIL HFA;VENTOLIN HFA) 108 (90 BASE) MCG/ACT inhaler Inhale 1 puff into the lungs 2 (two) times daily.      Marland Kitchen aspirin 81 MG chewable tablet Chew 81 mg by mouth daily.      . cromolyn (OPTICROM) 4 % ophthalmic solution Place 1 drop into both eyes 2 (two) times daily.      . folic acid (FOLVITE) 1 MG tablet Take 1 mg by mouth daily.      . nebivolol (BYSTOLIC) 10 MG tablet Take 10 mg by mouth daily.      . [DISCONTINUED] buPROPion (WELLBUTRIN XL) 150 MG 24 hr tablet Take 1 tablet (150 mg total) by mouth 2 (two) times daily.  60 tablet  5   No current facility-administered medications for this visit.     Physical Exam:  BP 159/88  Pulse 100  Resp 18  Ht '5\' 4"'  (1.626  m)  Wt 150 lb (68.04 kg)  BMI 25.73 kg/m2  SpO2 98% She looks well Lung exam reveals decreased breath sounds over the right lower lobe  Cardiac exam reveals a regular rate and rhythm with normal heart sounds Abdominal exam reveals active BS with no palpable masses or organomegaly.  Diagnostic Tests:  CLINICAL DATA: Cough and shortness of breath. Right pleural  effusion.  EXAM:  CHEST 2 VIEW  COMPARISON: Chest x-ray 05/04/2013. Chest CT 05/29/2013.  FINDINGS:  Masslike appearance of the right hilar region, unchanged compared to  recent prior examinations, apparently related to post radiation  changes on the recent CT examination. Moderate to large right  pleural effusion is increased compared to prior chest x-ray. Mild  scarring in the left lower lobe. No definite acute consolidative  airspace disease. No  evidence of pulmonary edema. Heart size is  normal. Right internal jugular single-lumen power porta cath with  tip terminating in the distal superior vena cava. Surgical clips in  the left hilar region related to prior left upper lobectomy.  IMPRESSION:  1. Interval increase in right-sided pleural effusion. Otherwise, the  radiographic appearance the chest is unchanged compared to prior  studies, as above.  Electronically Signed  By: Vinnie Langton M.D.  On: 06/07/2013 09:20    Impression:  She has a recurrent right pleural effusion that is suspicious for a malignant effusion given her history of Stage IV lung cancer with metastases to the right paratracheal lymph nodes and brain. I think a pleurx catheter and talc pleurodesis is indicated for management of this effusion. I discussed the procedure, alternatives, benefits and risks with the patient and her daughter including the small chance that this effusion may recur. I also discussed the possibility of a reaction to the talc including anaphylaxis. They understand and agree to proceed.  Plan:  Plan right pleurx catheter and talc pleurodesis on Monday 06/12/2013.

## 2013-06-11 MED ORDER — DEXTROSE 5 % IV SOLN
1.5000 g | INTRAVENOUS | Status: AC
  Administered 2013-06-12: 1.5 g via INTRAVENOUS
  Filled 2013-06-11: qty 1.5

## 2013-06-11 MED ORDER — TALC 5 G PL SUSR
3.0000 g | Freq: Once | INTRAVENOUS | Status: DC
  Filled 2013-06-11: qty 3

## 2013-06-12 ENCOUNTER — Encounter (HOSPITAL_COMMUNITY): Payer: Self-pay | Admitting: Certified Registered Nurse Anesthetist

## 2013-06-12 ENCOUNTER — Other Ambulatory Visit: Payer: Self-pay | Admitting: Family Medicine

## 2013-06-12 ENCOUNTER — Encounter (HOSPITAL_COMMUNITY)
Admission: RE | Disposition: A | Discharge status: Home / Self Care | Payer: Self-pay | Source: Ambulatory Visit | Attending: Surgery

## 2013-06-12 ENCOUNTER — Encounter (HOSPITAL_COMMUNITY): Payer: Medicare HMO | Admitting: Certified Registered Nurse Anesthetist

## 2013-06-12 ENCOUNTER — Ambulatory Visit: Payer: Self-pay | Admitting: Cardiology

## 2013-06-12 ENCOUNTER — Ambulatory Visit (HOSPITAL_COMMUNITY): Payer: Medicare HMO

## 2013-06-12 ENCOUNTER — Ambulatory Visit (HOSPITAL_COMMUNITY): Payer: Medicare HMO | Admitting: Certified Registered Nurse Anesthetist

## 2013-06-12 ENCOUNTER — Ambulatory Visit (HOSPITAL_COMMUNITY)
Admission: RE | Admit: 2013-06-12 | Discharge: 2013-06-12 | Disposition: A | Discharge status: Home / Self Care | Payer: Medicare HMO | Source: Ambulatory Visit | Attending: Surgery | Admitting: Surgery

## 2013-06-12 DIAGNOSIS — J449 Chronic obstructive pulmonary disease, unspecified: Secondary | ICD-10-CM | POA: Insufficient documentation

## 2013-06-12 DIAGNOSIS — F3289 Other specified depressive episodes: Secondary | ICD-10-CM | POA: Insufficient documentation

## 2013-06-12 DIAGNOSIS — K219 Gastro-esophageal reflux disease without esophagitis: Secondary | ICD-10-CM | POA: Diagnosis not present

## 2013-06-12 DIAGNOSIS — E7800 Pure hypercholesterolemia, unspecified: Secondary | ICD-10-CM | POA: Insufficient documentation

## 2013-06-12 DIAGNOSIS — J4489 Other specified chronic obstructive pulmonary disease: Secondary | ICD-10-CM | POA: Insufficient documentation

## 2013-06-12 DIAGNOSIS — J910 Malignant pleural effusion: Secondary | ICD-10-CM

## 2013-06-12 DIAGNOSIS — E785 Hyperlipidemia, unspecified: Secondary | ICD-10-CM | POA: Insufficient documentation

## 2013-06-12 DIAGNOSIS — F329 Major depressive disorder, single episode, unspecified: Secondary | ICD-10-CM | POA: Insufficient documentation

## 2013-06-12 DIAGNOSIS — Z85038 Personal history of other malignant neoplasm of large intestine: Secondary | ICD-10-CM | POA: Diagnosis not present

## 2013-06-12 DIAGNOSIS — C7949 Secondary malignant neoplasm of other parts of nervous system: Secondary | ICD-10-CM | POA: Diagnosis not present

## 2013-06-12 DIAGNOSIS — C3490 Malignant neoplasm of unspecified part of unspecified bronchus or lung: Secondary | ICD-10-CM | POA: Insufficient documentation

## 2013-06-12 DIAGNOSIS — I10 Essential (primary) hypertension: Secondary | ICD-10-CM | POA: Insufficient documentation

## 2013-06-12 DIAGNOSIS — C7931 Secondary malignant neoplasm of brain: Secondary | ICD-10-CM | POA: Insufficient documentation

## 2013-06-12 DIAGNOSIS — J90 Pleural effusion, not elsewhere classified: Secondary | ICD-10-CM

## 2013-06-12 HISTORY — PX: TALC PLEURODESIS: SHX2506

## 2013-06-12 HISTORY — PX: CHEST TUBE INSERTION: SHX231

## 2013-06-12 LAB — CBC
HEMATOCRIT: 40.8 % (ref 36.0–46.0)
HEMOGLOBIN: 12.9 g/dL (ref 12.0–15.0)
MCH: 25 pg — AB (ref 26.0–34.0)
MCHC: 31.6 g/dL (ref 30.0–36.0)
MCV: 79.1 fL (ref 78.0–100.0)
Platelets: 202 10*3/uL (ref 150–400)
RBC: 5.16 MIL/uL — ABNORMAL HIGH (ref 3.87–5.11)
RDW: 17.3 % — AB (ref 11.5–15.5)
WBC: 6.5 10*3/uL (ref 4.0–10.5)

## 2013-06-12 LAB — COMPREHENSIVE METABOLIC PANEL
ALBUMIN: 3.1 g/dL — AB (ref 3.5–5.2)
ALT: 7 U/L (ref 0–35)
AST: 12 U/L (ref 0–37)
Alkaline Phosphatase: 114 U/L (ref 39–117)
BUN: 8 mg/dL (ref 6–23)
CALCIUM: 9.2 mg/dL (ref 8.4–10.5)
CO2: 27 mEq/L (ref 19–32)
Chloride: 104 mEq/L (ref 96–112)
Creatinine, Ser: 0.82 mg/dL (ref 0.50–1.10)
GFR calc Af Amer: 86 mL/min — ABNORMAL LOW (ref >90)
GFR calc non Af Amer: 74 mL/min — ABNORMAL LOW (ref >90)
Glucose, Bld: 107 mg/dL — ABNORMAL HIGH (ref 70–99)
POTASSIUM: 4.1 meq/L (ref 3.7–5.3)
SODIUM: 142 meq/L (ref 137–147)
TOTAL PROTEIN: 6.8 g/dL (ref 6.0–8.3)
Total Bilirubin: 0.5 mg/dL (ref 0.3–1.2)

## 2013-06-12 LAB — PROTIME-INR
INR: 1.16 (ref 0.00–1.49)
Prothrombin Time: 14.6 seconds (ref 11.6–15.2)

## 2013-06-12 LAB — APTT: aPTT: 36 seconds (ref 24–37)

## 2013-06-12 SURGERY — INSERTION, PLEURAL DRAINAGE CATHETER
Anesthesia: Monitor Anesthesia Care | Site: Chest | Laterality: Right

## 2013-06-12 SURGERY — INSERTION, PLEURAL DRAINAGE CATHETER
Anesthesia: LOCAL

## 2013-06-12 MED ORDER — ESMOLOL HCL 10 MG/ML IV SOLN
INTRAVENOUS | Status: AC
  Filled 2013-06-12: qty 10

## 2013-06-12 MED ORDER — TALC 5 G PL SUSR
INTRAPLEURAL | Status: AC
  Filled 2013-06-12: qty 10

## 2013-06-12 MED ORDER — OXYCODONE HCL 5 MG/5ML PO SOLN: 5.0000 mg | Freq: Once | ORAL | Status: DC | PRN

## 2013-06-12 MED ORDER — 0.9 % SODIUM CHLORIDE (POUR BTL) OPTIME
TOPICAL | Status: DC | PRN
  Administered 2013-06-12: 2000 mL

## 2013-06-12 MED ORDER — MIDAZOLAM HCL 5 MG/5ML IJ SOLN
INTRAMUSCULAR | Status: DC | PRN
  Administered 2013-06-12: 2 mg via INTRAVENOUS

## 2013-06-12 MED ORDER — LIDOCAINE HCL 1 % IJ SOLN
INTRAMUSCULAR | Status: DC | PRN
  Administered 2013-06-12: 30 mL

## 2013-06-12 MED ORDER — ONDANSETRON HCL 4 MG/2ML IJ SOLN: 4.0000 mg | Freq: Once | INTRAMUSCULAR | Status: DC | PRN

## 2013-06-12 MED ORDER — HYDROMORPHONE HCL PF 1 MG/ML IJ SOLN: 0.2500 mg | INTRAMUSCULAR | Status: DC | PRN

## 2013-06-12 MED ORDER — TALC 5 G PL SUSR
INTRAPLEURAL | Status: DC | PRN
  Administered 2013-06-12: 3 g via INTRAPLEURAL

## 2013-06-12 MED ORDER — OXYCODONE HCL 5 MG PO TABS
5.0000 mg | ORAL_TABLET | Freq: Once | ORAL | Status: DC | PRN
Start: 2013-06-12 — End: 2013-06-12

## 2013-06-12 MED ORDER — FENTANYL CITRATE 0.05 MG/ML IJ SOLN
INTRAMUSCULAR | Status: DC | PRN
  Administered 2013-06-12: 25 ug via INTRAVENOUS

## 2013-06-12 MED ORDER — ONDANSETRON HCL 4 MG/2ML IJ SOLN
INTRAMUSCULAR | Status: DC | PRN
  Administered 2013-06-12: 4 mg via INTRAVENOUS

## 2013-06-12 MED ORDER — LACTATED RINGERS IV SOLN
INTRAVENOUS | Status: DC | PRN
  Administered 2013-06-12: 07:00:00 via INTRAVENOUS

## 2013-06-12 MED ORDER — PROPOFOL INFUSION 10 MG/ML OPTIME
INTRAVENOUS | Status: DC | PRN
  Administered 2013-06-12: 75 ug/kg/min via INTRAVENOUS

## 2013-06-12 MED ORDER — FENTANYL CITRATE 0.05 MG/ML IJ SOLN
INTRAMUSCULAR | Status: AC
Start: 2013-06-12 — End: 2013-06-12
  Filled 2013-06-12: qty 5

## 2013-06-12 MED ORDER — ESMOLOL HCL 10 MG/ML IV SOLN
INTRAVENOUS | Status: DC | PRN
  Administered 2013-06-12: 5 mg via INTRAVENOUS

## 2013-06-12 MED ORDER — NEBIVOLOL HCL 10 MG PO TABS
10.0000 mg | ORAL_TABLET | Freq: Once | ORAL | Status: DC
  Filled 2013-06-12: qty 1

## 2013-06-12 MED ORDER — MIDAZOLAM HCL 2 MG/2ML IJ SOLN
INTRAMUSCULAR | Status: AC
  Filled 2013-06-12: qty 2

## 2013-06-12 SURGICAL SUPPLY — 24 items
ADH SKN CLS APL DERMABOND .7 (GAUZE/BANDAGES/DRESSINGS) ×2
BRUSH SCRUB EZ PLAIN DRY (MISCELLANEOUS) ×4 IMPLANT
CANISTER SUCTION 2500CC (MISCELLANEOUS) ×4 IMPLANT
COVER SURGICAL LIGHT HANDLE (MISCELLANEOUS) ×4 IMPLANT
DERMABOND ADVANCED (GAUZE/BANDAGES/DRESSINGS) ×1
DERMABOND ADVANCED .7 DNX12 (GAUZE/BANDAGES/DRESSINGS) ×3 IMPLANT
DRAPE C-ARM 42X72 X-RAY (DRAPES) ×4 IMPLANT
DRAPE LAPAROSCOPIC ABDOMINAL (DRAPES) ×4 IMPLANT
GLOVE EUDERMIC 7 POWDERFREE (GLOVE) ×4 IMPLANT
GOWN PREVENTION PLUS XLARGE (GOWN DISPOSABLE) ×4 IMPLANT
GOWN STRL NON-REIN LRG LVL3 (GOWN DISPOSABLE) ×4 IMPLANT
KIT BASIN OR (CUSTOM PROCEDURE TRAY) ×4 IMPLANT
KIT PLEURX DRAIN CATH 1000ML (MISCELLANEOUS) ×4 IMPLANT
KIT PLEURX DRAIN CATH 15.5FR (DRAIN) ×4 IMPLANT
KIT ROOM TURNOVER OR (KITS) ×4 IMPLANT
NS IRRIG 1000ML POUR BTL (IV SOLUTION) ×4 IMPLANT
PACK GENERAL/GYN (CUSTOM PROCEDURE TRAY) ×4 IMPLANT
PAD ARMBOARD 7.5X6 YLW CONV (MISCELLANEOUS) ×8 IMPLANT
SET DRAINAGE LINE (MISCELLANEOUS) IMPLANT
SUT ETHILON 3 0 PS 1 (SUTURE) ×4 IMPLANT
SUT VIC AB 3-0 X1 27 (SUTURE) ×4 IMPLANT
TOWEL OR 17X24 6PK STRL BLUE (TOWEL DISPOSABLE) ×4 IMPLANT
TOWEL OR 17X26 10 PK STRL BLUE (TOWEL DISPOSABLE) ×4 IMPLANT
WATER STERILE IRR 1000ML POUR (IV SOLUTION) ×4 IMPLANT

## 2013-06-12 SURGICAL SUPPLY — 32 items
ADH SKN CLS APL DERMABOND .7 (GAUZE/BANDAGES/DRESSINGS) ×1
BRUSH SCRUB EZ PLAIN DRY (MISCELLANEOUS) ×3 IMPLANT
CANISTER SUCTION 2500CC (MISCELLANEOUS) ×3 IMPLANT
COVER SURGICAL LIGHT HANDLE (MISCELLANEOUS) ×3 IMPLANT
DERMABOND ADVANCED (GAUZE/BANDAGES/DRESSINGS) ×2
DERMABOND ADVANCED .7 DNX12 (GAUZE/BANDAGES/DRESSINGS) ×1 IMPLANT
DRAPE C-ARM 42X72 X-RAY (DRAPES) ×3 IMPLANT
DRAPE LAPAROSCOPIC ABDOMINAL (DRAPES) ×3 IMPLANT
DRAPE PROXIMA HALF (DRAPES) ×2 IMPLANT
GLOVE BIO SURGEON STRL SZ 6 (GLOVE) ×2 IMPLANT
GLOVE EUDERMIC 7 POWDERFREE (GLOVE) ×5 IMPLANT
GOWN PREVENTION PLUS XLARGE (GOWN DISPOSABLE) ×3 IMPLANT
GOWN STRL NON-REIN LRG LVL3 (GOWN DISPOSABLE) ×3 IMPLANT
KIT BASIN OR (CUSTOM PROCEDURE TRAY) ×3 IMPLANT
KIT PLEURX DRAIN CATH 1000ML (MISCELLANEOUS) ×3 IMPLANT
KIT PLEURX DRAIN CATH 15.5FR (DRAIN) ×3 IMPLANT
KIT ROOM TURNOVER OR (KITS) ×3 IMPLANT
NS IRRIG 1000ML POUR BTL (IV SOLUTION) ×5 IMPLANT
PACK CHEST (CUSTOM PROCEDURE TRAY) ×2 IMPLANT
PAD ARMBOARD 7.5X6 YLW CONV (MISCELLANEOUS) ×6 IMPLANT
SET DRAINAGE LINE (MISCELLANEOUS) IMPLANT
SPONGE GAUZE 4X4 12PLY STER LF (GAUZE/BANDAGES/DRESSINGS) ×2 IMPLANT
SUT ETHILON 2 0 FS 18 (SUTURE) ×2 IMPLANT
SUT ETHILON 3 0 PS 1 (SUTURE) ×3 IMPLANT
SUT SILK  1 MH (SUTURE) ×2
SUT SILK 1 MH (SUTURE) IMPLANT
SUT VIC AB 3-0 X1 27 (SUTURE) ×5 IMPLANT
SYR 50ML LL SCALE MARK (SYRINGE) ×2 IMPLANT
TAPE CLOTH SURG 4X10 WHT LF (GAUZE/BANDAGES/DRESSINGS) ×2 IMPLANT
TOWEL OR 17X24 6PK STRL BLUE (TOWEL DISPOSABLE) ×3 IMPLANT
TOWEL OR 17X26 10 PK STRL BLUE (TOWEL DISPOSABLE) ×3 IMPLANT
WATER STERILE IRR 1000ML POUR (IV SOLUTION) ×3 IMPLANT

## 2013-06-12 NOTE — Anesthesia Preprocedure Evaluation (Addendum)
Anesthesia Evaluation  Patient identified by MRN, date of birth, ID band Patient awake    Reviewed: Allergy & Precautions, H&P , NPO status , Patient's Chart, lab work & pertinent test results  Airway Mallampati: II TM Distance: >3 FB Neck ROM: Full    Dental  (+) Edentulous Upper and Edentulous Lower   Pulmonary COPDCurrent Smoker,    + decreased breath sounds      Cardiovascular hypertension, Pt. on medications and Pt. on home beta blockers + CAD and + Peripheral Vascular Disease Rhythm:Regular Rate:Normal     Neuro/Psych Anxiety Depression    GI/Hepatic GERD-  Medicated and Controlled,  Endo/Other  Hypothyroidism   Renal/GU      Musculoskeletal   Abdominal   Peds  Hematology   Anesthesia Other Findings   Reproductive/Obstetrics                         Anesthesia Physical Anesthesia Plan  ASA: III  Anesthesia Plan: MAC   Post-op Pain Management:    Induction: Intravenous  Airway Management Planned: Natural Airway and Simple Face Mask  Additional Equipment:   Intra-op Plan:   Post-operative Plan:   Informed Consent: I have reviewed the patients History and Physical, chart, labs and discussed the procedure including the risks, benefits and alternatives for the proposed anesthesia with the patient or authorized representative who has indicated his/her understanding and acceptance.   Dental advisory given  Plan Discussed with: CRNA, Anesthesiologist and Surgeon  Anesthesia Plan Comments:        Anesthesia Quick Evaluation

## 2013-06-12 NOTE — Op Note (Signed)
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 She underwent left upper lobectomy on 06/16/2010 for a Stage I (T1a, N0, M0) well differentiated adenocarcinoma of the lung. She had previously undergone sigmoid colectomy in 2008 for a Stage I adenocarcinoma of the colon. She developed a brain met in 01/2012 felt to be secondary to her lung cancer and underwent stereotactic radiosurgery followed by chemotherapy. She developed a right pleural effusion in October 2014 and had a thoracentesis removing 1.2 L of fluid that had suspicious atypical cell but not diagnostic of cancer. She had recurrence of the effusion recently and underwent thoracentesis removing 1.3 L of fluid. The cytology was again negative. She has a recurrent right pleural effusion that is suspicious for a malignant effusion given her history of Stage IV lung cancer with metastases to the right paratracheal lymph nodes and brain. I think a pleurx catheter and talc pleurodesis is indicated for management of this effusion. I discussed the procedure, alternatives, benefits and risks with the patient and her daughter including the small chance that this effusion may recur. I also discussed the possibility of a reaction to the talc including anaphylaxis. They understand and agree to proceed.   Preparation:  The patient was seen in the preoperative holding area and the correct patient, correct operation, correct operative sidewere confirmed with the patient after reviewing  the medical record and CT scan. The right side of the chest was signed by me. The consent was signed by me. Preoperative antibiotics were given. The patient was taken back to the operating room and positioned supine on the operating room table. After intravenous sedation by anesthesia the chest was prepped with betadine soap and solution and draped in the usual sterile manner. A surgical time-out was taken and the correct patient,operative side, and operative procedure were confirmed with the nursing and anesthesia staff.   Operative Procedure:   The chest wall entry site was located on the right lateral chest approximately in the 8th ICS. 1% lidocaine was infiltrated in the skin and subcutaneous tissue down to the pleural space. Serous fluid was encountered. A small incision was made and the right pleural space was entered with a needle catheter. The needle was removed and the guidewire inserted into the pleural space. Its position was checked with floroscopy. The skin exit site was chosen on the anterior chest wall just above the costal margin and lidocaine was infiltrated here and along a subcutaneous tunnel over to the chest wall entry site. A small incision was made at the skin exit site and the Pleurx catheter was tunneled from the exit site over to the chest wall entry site and positioned with the cuff just inside the exit site. An introducer and sheath were inserted into the pleural space over the guide wire and the introducer and wire removed. The catheter was inserted into the pleural space and the sheath removed. The catheter was connected to vacuum bottle suction and 1.3 liters of clouldy pleural fluid was removed. A sample was sent for cytology. The catheter was fixed to  the skin at the exit site with a nylon suture and the other incision was closed with a 3-0 vicryl subcuticular suture and Dermabond. Then 60 cc of talc slurry was injected through the catheter. The catheter was capped and a dressing  applied over the exit site. The patient tolerated the procedure well. The sponge, needle and instrument counts were correct according to the nurses and the patient was transported to the PACU in stable and satisfactory condition.

## 2013-06-12 NOTE — Discharge Instructions (Signed)
May resume normal activity.  May shower. Dressing is waterproof.

## 2013-06-12 NOTE — H&P (Signed)
[address]       [place] [address]             [phone]      Cardiothoracic Surgery History and Physical   PCP is [name], MD  Referring Provider is Heath Lark, MD  Chief Complaint   Patient presents with   .  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 She underwent left upper lobectomy on 06/16/2010 for a Stage I (T1a, N0, M0) well differentiated adenocarcinoma of the lung. She had previously undergone sigmoid colectomy in 2008 for a Stage I adenocarcinoma of the colon. She developed a brain met in 01/2012 felt to be secondary to her lung cancer and underwent stereotactic radiosurgery followed by chemotherapy. She developed a right pleural effusion in October 2014 and had a thoracentesis removing 1.2 L of fluid that had suspicious atypical cell but not diagnostic of cancer. She was seen yesterday for followup by Dr. Alvy Bimler and noted to have a recurrent right pleural effusion on exam, confirmed with CXR. My office was called for an urgent appointment to evaluate for a pleurX catheter and she was scheduled for today. She says she has had mild shortness of breath lately. She was scheduled for a right thoracentesis by Dr. Alvy Bimler and had that done today by IR removing 1.3 L of pleural fluid. Cytology is pending.  Past Medical History   Diagnosis  Date   .  Depression    .  Hyperlipidemia    .  Hypertension    .  Lung nodule      FNA ordered for 04/02/10 by HA>pos Ca   .  Chronic folliculitis      of groin   .  Fibrocystic breast changes    .  Family history of trichomonal vaginitis  05/2005   .  Postmenopausal    .  Depressive disorder    .  Hypercholesterolemia    .  GERD (gastroesophageal reflux disease)    .  Cerebral aneurysm    .  Back pain      .  Shortness of breath    .  COPD (chronic obstructive pulmonary disease)    .  Coronary artery disease    .  Arthritis    .  Weight loss  10/22/2011   .  Status post radiation therapy  11/16/11 - 12/29/11     Right Lung and Mediastinum: 60 Gy   .  Status post chemotherapy  comp. 12/29/11     Carboplatin/Taxol   .  Colon cancer  11/08   .  Lung cancer  06/16/10     PET scan 04/28/2010; primary: increase in size 02/2010 / Well Differentiated Adenocarcinoma of the lung   .  Brain metastases  02/15/12   .  S/P radiation therapy  03/01/12     SRS: 1 fraction / 20 Gray each to the Left Occipital Region and to the Right Insular Metastases   .  On antineoplastic chemotherapy  started 02/2012     Alimta   .  Fatigue  02/06/2013   .  Pleural effusion  05/03/2013    Past Surgical History   Procedure  Laterality  Date   .  Colectomy   03/22/07  Stage 1 pT2 N0, M0 Adenocarcinoma of the sigmoid colon   .  Tubal ligation     .  Lung lobectomy   06/16/10     Left Upper Lobectomy   .  Hernia repair     .  Breast surgery       Bil lumpectomy   .  Back surgery     .  Mediastinoscopy   10/19/2011     Procedure: MEDIASTINOSCOPY; Surgeon: Gaye Pollack, MD; Location: [facility] OR; Service: Thoracic; Laterality: N/A;   .  Cardiac cath x3      Family History   Problem  Relation  Age of Onset   .  Stomach cancer  Maternal Aunt    .  Breast cancer  Cousin    .  Cancer  Sister      Lymphatic   .  Osteoarthritis  Father    .  Gout  Father    .  Hypertension  Father    .  Heart disease  Mother      pericarditis;   .  Anesthesia problems  Neg Hx    Social History  History   Substance Use Topics   .  Smoking status:  Current Every Day Smoker -- 1.00 packs/day     Types:  Cigarettes   .  Smokeless tobacco:  Current User   .  Alcohol Use:  0.0 oz/week      Comment: occasional    Current Outpatient Prescriptions   Medication  Sig  Dispense  Refill   .  albuterol (PROVENTIL HFA;VENTOLIN HFA) 108 (90 BASE)  MCG/ACT inhaler  Inhale 2 puffs into the lungs every 6 (six) hours as needed. For shortness of breath  2 Inhaler  11   .  amoxicillin (AMOXIL) 500 MG capsule  Take 2 capsules (1,000 mg total) by mouth 2 (two) times daily.  40 capsule  0   .  aspirin 81 MG EC tablet  Take 81 mg by mouth every morning.     .  benzonatate (TESSALON) 200 MG capsule  Take 1 capsule (200 mg total) by mouth 2 (two) times daily as needed for cough.  60 capsule  3   .  BYSTOLIC 10 MG tablet  TAKE 1 TABLET BY MOUTH ONCE DAILY  30 tablet  5   .  cloNIDine (CATAPRES) 0.1 MG tablet  Take 1 tablet (0.1 mg total) by mouth 2 (two) times daily.  60 tablet  5   .  diclofenac sodium (VOLTAREN) 1 % GEL  Apply 2 g topically 4 (four) times daily. As needed for pain  983 g  11   .  folic acid (FOLVITE) 1 MG tablet  TAKE 1 TABLET BY MOUTH ONCE DAILY  30 tablet  5   .  lidocaine-prilocaine (EMLA) cream  Apply topically as needed. Apply to porta cath site one hour prior to needle stick.  30 g  2   .  nitroGLYCERIN (NITROSTAT) 0.4 MG SL tablet  Place 1 tablet (0.4 mg total) under the tongue every 5 (five) minutes as needed. For chest pain  10 tablet  0   .  omeprazole (PRILOSEC) 40 MG capsule  Take 1 capsule (40 mg total) by mouth daily.  90 capsule  2   .  potassium chloride SA (KLOR-CON M20) 20 MEQ tablet  Take 0.5 tablets (10 mEq total) by mouth daily.  90 tablet  3   .  pravastatin (PRAVACHOL) 40 MG tablet  Take 1 tablet (40 mg total) by mouth daily.  30 tablet  5   .  tiZANidine (ZANAFLEX) 2 MG tablet  Take 1 tablet (2 mg total) by mouth every 6 (six) hours as needed (Back Muscle Spasm).  30 tablet  3   .  traMADol (ULTRAM) 50 MG tablet  Take 1 tablet (50 mg total) by mouth every 6 (six) hours as needed. As needed for pain.  90 tablet  11   .  [DISCONTINUED] buPROPion (WELLBUTRIN XL) 150 MG 24 hr tablet  Take 1 tablet (150 mg total) by mouth 2 (two) times daily.  60 tablet  5    No current facility-administered medications for this  visit.    Allergies   Allergen  Reactions   .  Lisinopril  Cough   Review of Systems  Constitutional: Positive for activity change, appetite change, fatigue and unexpected weight change. Negative for fever and chills.  HENT: Negative.  Eyes: Negative.  Respiratory: Positive for cough and shortness of breath.  Cardiovascular: Negative.  Gastrointestinal: Negative.  Endocrine: Negative.  Genitourinary: Negative.  Musculoskeletal: Positive for arthralgias and myalgias.  Skin: Negative.  Allergic/Immunologic: Negative.  Hematological: Negative. Negative for adenopathy.  Psychiatric/Behavioral: Negative.  BP 145/86  Pulse 88  Resp 20  Ht _0  (1.626 m)  Wt 152 lb (68.947 kg)  BMI 26.08 kg/m2  SpO2 96%  Physical Exam  Constitutional: She is oriented to person, place, and time. She appears well-developed and well-nourished. No distress.  HENT:  Head: Normocephalic and atraumatic.  Mouth/Throat: Oropharynx is clear and moist.  Eyes: EOM are normal. Pupils are equal, round, and reactive to light.  Neck: Normal range of motion. Neck supple. No JVD present. No thyromegaly present.  Cardiovascular: Normal rate, regular rhythm and normal heart sounds.  No murmur heard.  Pulmonary/Chest: Effort normal.  Decreased breath sounds over the right base.  Abdominal: Soft. Bowel sounds are normal. She exhibits no distension and no mass. There is no tenderness.  Musculoskeletal: She exhibits no edema.  Lymphadenopathy:  She has no cervical adenopathy.  Neurological: She is alert and oriented to person, place, and time. She has normal strength. No cranial nerve deficit or sensory deficit.  Skin: Skin is warm and dry.  Psychiatric: She has a normal mood and affect.   Impression:   She has a recurrent right pleural effusion that is suspicious for a malignant effusion given her history of Stage IV lung cancer with metastases to the right paratracheal lymph nodes and brain. I think a pleurx  catheter and talc pleurodesis is indicated for management of this effusion. I discussed the procedure, alternatives, benefits and risks with the patient and her daughter including the small chance that this effusion may recur. I also discussed the possibility of a reaction to the talc including anaphylaxis. They understand and agree to proceed.   Plan:  Right Pleurx catheter.

## 2013-06-12 NOTE — Brief Op Note (Signed)
06/12/2013  8:16 AM  PATIENT:  Lauren [name]  63 y.o. female  PRE-OPERATIVE DIAGNOSIS:  RIGHT PLEURAL EFFUSION  POST-OPERATIVE DIAGNOSIS:  RIGHT PLEURAL EFFUSION  PROCEDURE:  Procedure(s): INSERTION PLEURAL DRAINAGE CATHETER (Right) TALC PLEURODESIS (Right)  SURGEON:  Surgeon(s) and Role:    * Gaye Pollack, MD - Primary  PHYSICIAN ASSISTANT: none  ASSISTANTS: none   ANESTHESIA:   local and MAC  EBL:     BLOOD ADMINISTERED:none  DRAINS: none   LOCAL MEDICATIONS USED:  XYLOCAINE   SPECIMEN:  Source of Specimen:  right pleural effusion  DISPOSITION OF SPECIMEN:  PATHOLOGY  COUNTS:  YES   DICTATION: .Note written in EPIC  PLAN OF CARE: Discharge to home after PACU  PATIENT DISPOSITION:  PACU - hemodynamically stable.   Delay start of Pharmacological VTE agent (>24hrs) due to surgical blood loss or risk of bleeding: not applicable

## 2013-06-12 NOTE — Interval H&P Note (Signed)
History and Physical Interval Note:  06/12/2013 7:29 AM  Lauren [name]  has presented today for surgery, with the diagnosis of RIGHT PLEURAL EFFUSION  The various methods of treatment have been discussed with the patient and family. After consideration of risks, benefits and other options for treatment, the patient has consented to  Procedure(s): INSERTION PLEURAL DRAINAGE CATHETER (Right) TALC PLEURADESIS (Right) as a surgical intervention .  The patient's history has been reviewed, patient examined, no change in status, stable for surgery.  I have reviewed the patient's chart and labs.  Questions were answered to the patient's satisfaction.     Gaye Pollack

## 2013-06-12 NOTE — Care Management Note (Signed)
    Page 1 of 1   06/12/2013     11:20:47 AM   CARE MANAGEMENT NOTE 06/12/2013  Patient:  Lauren [name]   Account Number:  [id number]  Date Initiated:  06/12/2013  Documentation initiated by:  Ellen Mayol  Subjective/Objective Assessment:   Home Health     Action/Plan:   Anticipated DC Date:  06/12/2013   Anticipated DC Plan:  [place]         Choice offered to / List presented to:  C-1 Patient        [place] arranged  HH-1 RN      [place].   Status of service:   Medicare Important Message given?   (If response is "NO", the following Medicare IM given date fields will be blank) Date Medicare IM given:   Date Additional Medicare IM given:    Discharge Disposition:    Per UR Regulation:    If discussed at Long Length of Stay Meetings, dates discussed:    Comments:  Patient underwent placement of PleurX catheter today and was discharged home.  Patient will be seen by [facility] starting 06/13/13 for daily drainage of catheter. Patient was discharged with one case of canisters.  I have faxed the patients information to [facility] and the patient's daughter was given follow-up instructions for Lauren [name] to ensure future deliveris of canisters.

## 2013-06-12 NOTE — Transfer of Care (Signed)
Immediate Anesthesia Transfer of Care Note  Patient: Lauren [name]  Procedure(s) Performed: Procedure(s): INSERTION PLEURAL DRAINAGE CATHETER (Right) TALC PLEURADESIS (Right)  Patient Location: PACU  Anesthesia Type:MAC  Level of Consciousness: awake, alert , oriented and patient cooperative  Airway & Oxygen Therapy: Patient Spontanous Breathing and Patient connected to nasal cannula oxygen  Post-op Assessment: Report given to PACU RN, Post -op Vital signs reviewed and stable and Patient moving all extremities X 4  Post vital signs: Reviewed and stable  Complications: No apparent anesthesia complications

## 2013-06-12 NOTE — Preoperative (Signed)
Beta Blockers   Reason not to administer Beta Blockers:Not Applicable, Pt did not take her Bystolic because she ran out, will give iv beta blocker intra-op

## 2013-06-13 ENCOUNTER — Encounter (HOSPITAL_COMMUNITY): Payer: Self-pay | Admitting: Surgery

## 2013-06-19 ENCOUNTER — Encounter: Payer: Self-pay | Admitting: Cardiology

## 2013-06-19 ENCOUNTER — Encounter: Payer: Self-pay | Admitting: General Surgery

## 2013-06-19 ENCOUNTER — Ambulatory Visit (INDEPENDENT_AMBULATORY_CARE_PROVIDER_SITE_OTHER): Payer: Medicare HMO | Admitting: Cardiology

## 2013-06-19 ENCOUNTER — Other Ambulatory Visit: Payer: Self-pay | Admitting: *Deleted

## 2013-06-19 ENCOUNTER — Ambulatory Visit: Payer: Self-pay

## 2013-06-19 VITALS — BP 124/78 | HR 98 | Ht 64.0 in | Wt 140.0 lb

## 2013-06-19 DIAGNOSIS — I2510 Atherosclerotic heart disease of native coronary artery without angina pectoris: Secondary | ICD-10-CM

## 2013-06-19 DIAGNOSIS — J90 Pleural effusion, not elsewhere classified: Secondary | ICD-10-CM

## 2013-06-19 DIAGNOSIS — R0602 Shortness of breath: Secondary | ICD-10-CM

## 2013-06-19 MED ORDER — FUROSEMIDE 20 MG PO TABS: 20.0000 mg | ORAL_TABLET | Freq: Every day | ORAL | Status: DC

## 2013-06-19 NOTE — Progress Notes (Signed)
Patient ID: AMIRACLE NEISES, female   DOB: 03-19-1951, 63 y.o.   MRN: [id number]     Patient Name: Lauren [name] Date of Encounter: 06/19/2013  Primary Care Provider:  [name], MD Primary Cardiologist:  Ena Dawley, H (prior patient of Dr Verl Blalock)  Problem List   Past Medical History  Diagnosis Date  . Depression   . Hyperlipidemia   . Hypertension   . Lung nodule     FNA ordered for 04/02/10 by HA>pos Ca  . Chronic folliculitis     of groin  . Fibrocystic breast changes   . Family history of trichomonal vaginitis 05/2005  . Postmenopausal   . Depressive disorder   . Hypercholesterolemia   . GERD (gastroesophageal reflux disease)   . Cerebral aneurysm   . Back pain   . Shortness of breath   . COPD (chronic obstructive pulmonary disease)   . Coronary artery disease   . Arthritis   . Weight loss 10/22/2011  . Status post radiation therapy 11/16/11 - 12/29/11    Right Lung and Mediastinum: 60 Gy  . Status post chemotherapy comp. 12/29/11    Carboplatin/Taxol  . Colon cancer 11/08  . Lung cancer 06/16/10    PET scan 04/28/2010; primary: increase in size 02/2010 / Well Differentiated Adenocarcinoma of the lung   . Brain metastases 02/15/12  . S/P radiation therapy 03/01/12    SRS: 1 fraction / 20 Gray each to the Left Occipital Region and to the Right Insular Metastases  . On antineoplastic chemotherapy started 02/2012    Alimta  . Fatigue 02/06/2013  . Pleural effusion 05/03/2013   Past Surgical History  Procedure Laterality Date  . Colectomy  03/22/07    Stage 1 pT2 N0, M0 Adenocarcinoma of the sigmoid  colon  . Tubal ligation    . Lung lobectomy  06/16/10    Left Upper Lobectomy  . Hernia repair    . Breast surgery      Bil lumpectomy  . Back surgery    . Mediastinoscopy  10/19/2011    Procedure: MEDIASTINOSCOPY;  Surgeon: Gaye Pollack, MD;  Location: [facility] OR;  Service: Thoracic;  Laterality: N/A;  . Cardiac cath x3    . Chest tube insertion Right  06/12/2013    Procedure: INSERTION PLEURAL DRAINAGE CATHETER;  Surgeon: Gaye Pollack, MD;  Location: [place];  Service: Thoracic;  Laterality: Right;  . Talc pleurodesis Right 06/12/2013    Procedure: Pietro Cassis;  Surgeon: Gaye Pollack, MD;  Location: [place];  Service: Thoracic;  Laterality: Right;    Allergies  Allergies  Allergen Reactions  . Adhesive [Tape] Rash  . 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 She underwent left upper lobectomy on 06/16/2010 for a Stage I (T1a, N0, M0) well differentiated adenocarcinoma of the lung. She developed a brain met in 01/2012 felt to be secondary to her lung cancer and underwent stereotactic radiosurgery followed by chemotherapy. She developed a right pleural effusion in October 2014 and had a thoracentesis removing 1.2 L of fluid that had suspicious atypical cell but not diagnostic of cancer. She had recurrence of the effusion recently and underwent thoracentesis removing 1.3 L of fluid where  cytology was again negative. She has a recurrent right pleural effusion with  removal of 1.3 L of fluid on 06/12/13 with talc pleurodesis to prevent further effusions.  The patient states that she resting shortness of breath that has bee stable since she started to have these effusions. She experiences chest wall pain with deep inspiration, she denies LE edema, palpitations or syncope.   Home Medications  Prior to Admission medications   Medication Sig Start Date End Date Taking? Authorizing Provider  albuterol (PROVENTIL HFA;VENTOLIN HFA) 108 (90 BASE) MCG/ACT inhaler Inhale 1 puff into the lungs 2 (two) times daily.   Yes Historical Provider, MD  aspirin 81 MG chewable tablet Chew 81 mg by mouth daily.   Yes  Historical Provider, MD  benzonatate (TESSALON) 200 MG capsule Take 1 capsule (200 mg total) by mouth 2 (two) times daily as needed for cough. 03/23/13  Yes Zigmund Gottron, MD  cloNIDine (CATAPRES) 0.1 MG tablet Take 1 tablet (0.1 mg total) by mouth 2 (two) times daily. 10/25/12  Yes Cletus Gash, MD  cromolyn (OPTICROM) 4 % ophthalmic solution Place 1 drop into both eyes 2 (two) times daily.   Yes Historical Provider, MD  diclofenac sodium (VOLTAREN) 1 % GEL Apply 2 g topically 4 (four) times daily. As needed for pain 10/25/12  Yes Cletus Gash, MD  folic acid (FOLVITE) 1 MG tablet Take 1 mg by mouth daily.   Yes Historical Provider, MD  lidocaine-prilocaine (EMLA) cream Apply topically as needed. Apply to porta cath site one hour prior to needle stick. 04/18/12  Yes Nobie Putnam, MD  magnesium oxide (MAG-OX) 400 (241.3 MG) MG tablet Take 1 tablet (400 mg total) by mouth daily. 05/29/13  Yes Heath Lark, MD  nebivolol (BYSTOLIC) 10 MG tablet Take 10 mg by mouth daily.   Yes Historical Provider, MD  nitroGLYCERIN (NITROSTAT) 0.4 MG SL tablet Place 1 tablet (0.4 mg total) under the tongue every 5 (five) minutes as needed. For chest pain 10/25/12  Yes Cletus Gash, MD  omeprazole (PRILOSEC) 40 MG capsule Take 1 capsule (40 mg total) by mouth daily. 04/04/13  Yes Amber Fidel Levy, MD  potassium chloride SA (K-DUR,KLOR-CON) 20 MEQ tablet Take 20 mEq by mouth daily. 02/20/13  Yes Amber Fidel Levy, MD  pravastatin (PRAVACHOL) 40 MG tablet TAKE 1 TABLET BY MOUTH ONCE DAILY 06/12/13  Yes Amber Fidel Levy, MD  tiZANidine (ZANAFLEX) 2 MG tablet Take 1 tablet (2 mg total) by mouth every 6 (six) hours as needed (Back Muscle Spasm). 08/17/12  Yes Cletus Gash, MD  traMADol (ULTRAM) 50 MG tablet Take 1 tablet (50 mg total) by mouth every 6 (six) hours as needed. As needed for pain. 06/28/12 06/28/13 Yes Cletus Gash, MD    Family History  Family History  Problem Relation Age of Onset  .  Stomach cancer Maternal Aunt   . Breast cancer Cousin   . Cancer Sister     Lymphatic  . Osteoarthritis Father   . Gout Father   . Hypertension Father   . Heart disease Mother     pericarditis;   . Anesthesia problems Neg Hx     Social History  History   Social History  . Marital Status: Widowed    Spouse Name: N/A    Number of Children: N/A  . 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 Smoking status: Current Every Day Smoker -- 1.00 packs/day    Types: Cigarettes  . Smokeless tobacco: Current User  .  Alcohol Use: 0.0 oz/week     Comment: occasional  . Drug Use: No  . Sexual Activity: Not on file   Other Topics Concern  . Not on file   Social History Narrative  . No narrative on file     Review of Systems, as per HPI, otherwise negative General:  No chills, fever, night sweats or weight changes.  Cardiovascular:  No chest pain, dyspnea on exertion, edema, orthopnea, palpitations, paroxysmal nocturnal dyspnea. Dermatological: No rash, lesions/masses Respiratory: No cough, dyspnea Urologic: No hematuria, dysuria Abdominal:   No nausea, vomiting, diarrhea, bright red blood per rectum, melena, or hematemesis Neurologic:  No visual changes, wkns, changes in mental status. All other systems reviewed and are otherwise negative except as noted above.  Physical Exam  Blood pressure 124/78, pulse 98, height '5\' 4"'  (1.626 m), weight 140 lb (63.504 kg), SpO2 98.00%.  General: Pleasant, NAD Psych: Normal affect. Neuro: Alert and oriented X 3. Moves all extremities spontaneously. HEENT: Normal  Neck: Supple without bruits or JVD. Lungs:  Resp regular and unlabored, CTA. Bandage covering pleural drain.  Heart: RRR no s3, s4, or murmurs. Abdomen: Soft, non-tender, non-distended, BS + x 4.  Extremities: No clubbing, cyanosis or edema. DP/PT/Radials 2+ and equal bilaterally.  Labs:  No results found for  this basename: CKTOTAL, CKMB, TROPONINI,  in the last 72 hours Lab Results  Component Value Date   WBC 6.5 06/12/2013   HGB 12.9 06/12/2013   HCT 40.8 06/12/2013   MCV 79.1 06/12/2013   PLT 202 06/12/2013   No results found for this basename: NA, K, CL, CO2, BUN, CREATININE, CALCIUM, LABALBU, PROT, BILITOT, ALKPHOS, ALT, AST, GLUCOSE,  in the last 168 hours Lab Results  Component Value Date   CHOL 198 03/21/2012   HDL 53 03/21/2012   LDLCALC 124* 03/21/2012   TRIG 104 03/21/2012   Lab Results  Component Value Date   DDIMER  Value: 0.29        AT THE INHOUSE ESTABLISHED CUTOFF VALUE OF 0.48 ug/mL FEU, THIS ASSAY HAS BEEN DOCUMENTED IN THE LITERATURE TO HAVE A SENSITIVITY AND NEGATIVE PREDICTIVE VALUE OF AT LEAST 98 TO 99%.  THE TEST RESULT SHOULD BE CORRELATED WITH AN ASSESSMENT OF THE CLINICAL PROBABILITY OF DVT / VTE. 06/03/2009   Accessory Clinical Findings  Echocardiogram - none  ECG - 03/02/2013 SR, nonspecific T wave abnormalities.    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 Despite high suspicion for malignant effusion, it has never been proven We will evaluate for possible heart failure as a cause of recurrent right sided pleural effusions.  We will order echocardiogram, start Lasix 20 mg po daily (considering patient's hypotension) and follow in 1 month.    Dorothy Spark, MD, [facility] 06/19/2013, 11:33 AM

## 2013-06-19 NOTE — Patient Instructions (Signed)
Your physician has recommended you make the following change in your medication: 1. Start Lasix 20 MG  1 Tablet Daily  Your physician has requested that you have an echocardiogram. Echocardiography is a painless test that uses sound waves to create images of your heart. It provides your doctor with information about the size and shape of your heart and how well your heart's chambers and valves are working. This procedure takes approximately one hour. There are no restrictions for this procedure.  Your physician recommends that you schedule a follow-up appointment in: 1 Month with Dr Meda Coffee

## 2013-06-21 ENCOUNTER — Ambulatory Visit (INDEPENDENT_AMBULATORY_CARE_PROVIDER_SITE_OTHER): Payer: Commercial Managed Care - HMO | Admitting: Surgery

## 2013-06-21 ENCOUNTER — Encounter: Payer: Self-pay | Admitting: Surgery

## 2013-06-21 ENCOUNTER — Ambulatory Visit
Admission: RE | Admit: 2013-06-21 | Discharge: 2013-06-21 | Disposition: A | Discharge status: Home / Self Care | Payer: Commercial Managed Care - HMO | Source: Ambulatory Visit | Attending: Surgery | Admitting: Surgery

## 2013-06-21 VITALS — BP 117/79 | HR 88 | Resp 20 | Ht 64.0 in | Wt 140.0 lb

## 2013-06-21 DIAGNOSIS — J90 Pleural effusion, not elsewhere classified: Secondary | ICD-10-CM

## 2013-06-21 DIAGNOSIS — Z09 Encounter for follow-up examination after completed treatment for conditions other than malignant neoplasm: Secondary | ICD-10-CM

## 2013-06-21 NOTE — Progress Notes (Signed)
HPI:  She returns today for follow-up after insertion of a right Pleurx catheter and talc pleurodesis on 06/12/2013 for recurrent pleural effusion. The cytology again showed no malignant cells, only chronic inflammation. Home health nursing has been draining it daily at home and there has been minimal to no drainage for the past 5 days. She still has some mild dyspnea with exertion. She continues to smoke.  Current Outpatient Prescriptions  Medication Sig Dispense Refill  . albuterol (PROVENTIL HFA;VENTOLIN HFA) 108 (90 BASE) MCG/ACT inhaler Inhale 1 puff into the lungs 2 (two) times daily.      Marland Kitchen aspirin 81 MG chewable tablet Chew 81 mg by mouth daily.      . benzonatate (TESSALON) 200 MG capsule Take 1 capsule (200 mg total) by mouth 2 (two) times daily as needed for cough.  60 capsule  3  . cloNIDine (CATAPRES) 0.1 MG tablet Take 1 tablet (0.1 mg total) by mouth 2 (two) times daily.  60 tablet  5  . cromolyn (OPTICROM) 4 % ophthalmic solution Place 1 drop into both eyes 2 (two) times daily.      . diclofenac sodium (VOLTAREN) 1 % GEL Apply 2 g topically 4 (four) times daily. As needed for pain  767 g  11  . folic acid (FOLVITE) 1 MG tablet Take 1 mg by mouth daily.      . furosemide (LASIX) 20 MG tablet Take 1 tablet (20 mg total) by mouth daily.  30 tablet  11  . lidocaine-prilocaine (EMLA) cream Apply topically as needed. Apply to porta cath site one hour prior to needle stick.  30 g  2  . magnesium oxide (MAG-OX) 400 (241.3 MG) MG tablet Take 1 tablet (400 mg total) by mouth daily.  30 tablet  1  . nebivolol (BYSTOLIC) 10 MG tablet Take 10 mg by mouth daily.      . nitroGLYCERIN (NITROSTAT) 0.4 MG SL tablet Place 1 tablet (0.4 mg total) under the tongue every 5 (five) minutes as needed. For chest pain  10 tablet  0  . omeprazole (PRILOSEC) 40 MG capsule Take 1 capsule (40 mg total) by mouth daily.  90 capsule  2  . potassium chloride SA (K-DUR,KLOR-CON) 20 MEQ tablet Take 20 mEq by  mouth daily.      . pravastatin (PRAVACHOL) 40 MG tablet TAKE 1 TABLET BY MOUTH ONCE DAILY  30 tablet  11  . tiZANidine (ZANAFLEX) 2 MG tablet Take 1 tablet (2 mg total) by mouth every 6 (six) hours as needed (Back Muscle Spasm).  30 tablet  3  . traMADol (ULTRAM) 50 MG tablet Take 1 tablet (50 mg total) by mouth every 6 (six) hours as needed. As needed for pain.  90 tablet  11  . [DISCONTINUED] buPROPion (WELLBUTRIN XL) 150 MG 24 hr tablet Take 1 tablet (150 mg total) by mouth 2 (two) times daily.  60 tablet  5   No current facility-administered medications for this visit.     Physical Exam: BP 117/79  Pulse 88  Resp 20  Ht 5\' 4"  (1.626 m)  Wt 140 lb (63.504 kg)  BMI 24.02 kg/m2  SpO2 97% She looks well Lung exam reveals decreased breath sounds throughout both lungs. The pleurx catheter exit site looks good.  Diagnostic Tests:  CLINICAL DATA: Lung cancer with pleural effusion.  EXAM:  CHEST 2 VIEW  COMPARISON: 06/12/2013.  FINDINGS:  Right-sided Port-A-Cath remains in place with catheter tip position  at the  distal SVC level. A right pleural drain is new in the  interval. Right pleural effusion is slightly decreased in the  interval, best appreciated on the lateral film. There is  subsegmental atelectasis at the left base. The cardiopericardial  silhouette is within normal limits for size.  IMPRESSION:  Slight interval decrease in right pleural effusion with right  pleural drain in situ. Otherwise stable exam.  Electronically Signed  By: Misty Stanley M.D.  On: 06/21/2013 09:23    Impression:  There is no drainage from the catheter and the CXR looks ok. There is still some opacity at the right base which I would expect after talc pleurodesis. The catheter was removed without difficulty. This is suspicious for a malignant effusion given her history but repeat cytology has been negative. She has had some edema in her legs so maybe this is related to some CHF. She is on a  diuretic.  Plan:  She will return to see me in 2 weeks with a CXR.

## 2013-06-28 DIAGNOSIS — J910 Malignant pleural effusion: Secondary | ICD-10-CM

## 2013-07-02 NOTE — Anesthesia Postprocedure Evaluation (Signed)
  Anesthesia Post-op Note  Patient: Lauren [name]  Procedure(s) Performed: Procedure(s): INSERTION PLEURAL DRAINAGE CATHETER (Right) TALC PLEURADESIS (Right)  Patient Location: PACU  Anesthesia Type:MAC  Level of Consciousness: awake, alert  and oriented  Airway and Oxygen Therapy: Patient Spontanous Breathing  Post-op Pain: mild  Post-op Assessment: Post-op Vital signs reviewed, Patient's Cardiovascular Status Stable, Respiratory Function Stable, Patent Airway and Pain level controlled  Post-op Vital Signs: stable  Complications: No apparent anesthesia complications

## 2013-07-03 ENCOUNTER — Other Ambulatory Visit: Payer: Self-pay | Admitting: *Deleted

## 2013-07-03 DIAGNOSIS — J90 Pleural effusion, not elsewhere classified: Secondary | ICD-10-CM

## 2013-07-05 ENCOUNTER — Ambulatory Visit: Payer: Commercial Managed Care - HMO | Admitting: Surgery

## 2013-07-11 ENCOUNTER — Other Ambulatory Visit (HOSPITAL_COMMUNITY): Payer: Self-pay

## 2013-07-18 ENCOUNTER — Other Ambulatory Visit: Payer: Self-pay | Admitting: *Deleted

## 2013-07-18 DIAGNOSIS — C3490 Malignant neoplasm of unspecified part of unspecified bronchus or lung: Secondary | ICD-10-CM

## 2013-07-18 MED ORDER — POTASSIUM CHLORIDE CRYS ER 20 MEQ PO TBCR: 20.0000 meq | EXTENDED_RELEASE_TABLET | Freq: Every day | ORAL | Status: DC

## 2013-07-18 MED ORDER — FOLIC ACID 1 MG PO TABS: 1.0000 mg | ORAL_TABLET | Freq: Every day | ORAL | Status: DC

## 2013-07-19 ENCOUNTER — Encounter: Payer: Self-pay | Admitting: Surgery

## 2013-07-19 ENCOUNTER — Ambulatory Visit (INDEPENDENT_AMBULATORY_CARE_PROVIDER_SITE_OTHER): Payer: Commercial Managed Care - HMO | Admitting: Surgery

## 2013-07-19 ENCOUNTER — Ambulatory Visit
Admission: RE | Admit: 2013-07-19 | Discharge: 2013-07-19 | Disposition: A | Discharge status: Home / Self Care | Payer: Commercial Managed Care - HMO | Source: Ambulatory Visit | Attending: Surgery | Admitting: Surgery

## 2013-07-19 VITALS — BP 137/80 | HR 90 | Resp 20 | Ht 64.0 in | Wt 140.0 lb

## 2013-07-19 DIAGNOSIS — Z09 Encounter for follow-up examination after completed treatment for conditions other than malignant neoplasm: Secondary | ICD-10-CM

## 2013-07-19 DIAGNOSIS — J90 Pleural effusion, not elsewhere classified: Secondary | ICD-10-CM

## 2013-07-19 DIAGNOSIS — Z85118 Personal history of other malignant neoplasm of bronchus and lung: Secondary | ICD-10-CM

## 2013-07-19 NOTE — Progress Notes (Signed)
HPI:  She returns today for follow-up of a recurrent right pleural effusion treated with a Pleurx catheter and talc pleurodesis on 06/12/2013. I removed the catheter on 2/4 since it was no longer draining any fluid. She has continued to feel well and says that her breathing has been very good. Her appetite is improving as is her energy level.  Current Outpatient Prescriptions  Medication Sig Dispense Refill  . albuterol (PROVENTIL HFA;VENTOLIN HFA) 108 (90 BASE) MCG/ACT inhaler Inhale 1 puff into the lungs 2 (two) times daily.      Marland Kitchen aspirin 81 MG chewable tablet Chew 81 mg by mouth daily.      . benzonatate (TESSALON) 200 MG capsule Take 1 capsule (200 mg total) by mouth 2 (two) times daily as needed for cough.  60 capsule  3  . cloNIDine (CATAPRES) 0.1 MG tablet Take 1 tablet (0.1 mg total) by mouth 2 (two) times daily.  60 tablet  5  . cromolyn (OPTICROM) 4 % ophthalmic solution Place 1 drop into both eyes 2 (two) times daily.      . diclofenac sodium (VOLTAREN) 1 % GEL Apply 2 g topically 4 (four) times daily. As needed for pain  010 g  11  . folic acid (FOLVITE) 1 MG tablet Take 1 tablet (1 mg total) by mouth daily.  30 tablet  0  . furosemide (LASIX) 20 MG tablet Take 1 tablet (20 mg total) by mouth daily.  30 tablet  11  . lidocaine-prilocaine (EMLA) cream Apply topically as needed. Apply to porta cath site one hour prior to needle stick.  30 g  2  . magnesium oxide (MAG-OX) 400 (241.3 MG) MG tablet Take 1 tablet (400 mg total) by mouth daily.  30 tablet  1  . nebivolol (BYSTOLIC) 10 MG tablet Take 10 mg by mouth daily.      . nitroGLYCERIN (NITROSTAT) 0.4 MG SL tablet Place 1 tablet (0.4 mg total) under the tongue every 5 (five) minutes as needed. For chest pain  10 tablet  0  . omeprazole (PRILOSEC) 40 MG capsule Take 1 capsule (40 mg total) by mouth daily.  90 capsule  2  . potassium chloride SA (K-DUR,KLOR-CON) 20 MEQ tablet Take 1 tablet (20 mEq total) by mouth daily.  30 tablet   0  . pravastatin (PRAVACHOL) 40 MG tablet TAKE 1 TABLET BY MOUTH ONCE DAILY  30 tablet  11  . tiZANidine (ZANAFLEX) 2 MG tablet Take 1 tablet (2 mg total) by mouth every 6 (six) hours as needed (Back Muscle Spasm).  30 tablet  3  . [DISCONTINUED] buPROPion (WELLBUTRIN XL) 150 MG 24 hr tablet Take 1 tablet (150 mg total) by mouth 2 (two) times daily.  60 tablet  5   No current facility-administered medications for this visit.     Physical Exam: BP 137/80  Pulse 90  Resp 20  Ht 5\' 4"  (1.626 m)  Wt 140 lb (63.504 kg)  BMI 24.02 kg/m2  SpO2 98% She looks well Lung exam shows decreased breath sounds at the right base.  Diagnostic Tests:  CLINICAL DATA: History of metastatic lung carcinoma with recurrent  right pleural effusion, follow-up .  EXAM:  CHEST 2 VIEW  COMPARISON: Chest x-ray of 06/21/2013  FINDINGS:  There is little change in the small right pleural effusion after the  removal of the PleurX catheter. No pneumothorax is seen. Mild linear  scarring at the lung bases remains. A right-sided power port  Port-A-Cath is unchanged with the tip in the lower SVC near the  expected SVC -RA junction. Somewhat prominent right paratracheal  soft tissues remain.  IMPRESSION:  No change in volume of the small right pleural effusion after  removal of the PleurX catheter on the right.  Electronically Signed  By: Ivar Drape M.D.  On: 07/19/2013 12:25   Impression:  She is doing well and CXR shows no significant pleural effusion. There is persistent density at the right base which I would expect after talc pleurodesis. It is not completely clear why she developed this but a malignant etiology is very suspicious even though the fluid cytology from thoracentesis and Pleurx insertion were negative.  Plan:  She has an appt with Dr. Alvy Bimler soon. She does not need to return to see me unless she develops a recurrent effusion, which is very unlikely.

## 2013-07-20 ENCOUNTER — Other Ambulatory Visit: Payer: Self-pay | Admitting: *Deleted

## 2013-07-20 DIAGNOSIS — C3490 Malignant neoplasm of unspecified part of unspecified bronchus or lung: Secondary | ICD-10-CM

## 2013-07-20 MED ORDER — LIDOCAINE-PRILOCAINE 2.5-2.5 % EX CREA: TOPICAL_CREAM | CUTANEOUS | Status: DC | PRN

## 2013-07-21 ENCOUNTER — Other Ambulatory Visit: Payer: Self-pay

## 2013-07-21 ENCOUNTER — Ambulatory Visit
Admission: RE | Admit: 2013-07-21 | Discharge: 2013-07-21 | Disposition: A | Discharge status: Home / Self Care | Payer: Commercial Managed Care - HMO | Source: Ambulatory Visit | Attending: Radiation Oncology | Admitting: Radiation Oncology

## 2013-07-21 ENCOUNTER — Ambulatory Visit (HOSPITAL_BASED_OUTPATIENT_CLINIC_OR_DEPARTMENT_OTHER): Payer: Medicare HMO

## 2013-07-21 VITALS — BP 145/76 | HR 80 | Temp 97.5°F

## 2013-07-21 DIAGNOSIS — Z452 Encounter for adjustment and management of vascular access device: Secondary | ICD-10-CM

## 2013-07-21 DIAGNOSIS — C7931 Secondary malignant neoplasm of brain: Secondary | ICD-10-CM

## 2013-07-21 DIAGNOSIS — Z95828 Presence of other vascular implants and grafts: Secondary | ICD-10-CM

## 2013-07-21 DIAGNOSIS — C7949 Secondary malignant neoplasm of other parts of nervous system: Principal | ICD-10-CM

## 2013-07-21 DIAGNOSIS — C3410 Malignant neoplasm of upper lobe, unspecified bronchus or lung: Secondary | ICD-10-CM

## 2013-07-21 MED ORDER — HEPARIN SOD (PORK) LOCK FLUSH 100 UNIT/ML IV SOLN
500.0000 [IU] | Freq: Once | INTRAVENOUS | Status: AC
  Administered 2013-07-21: 500 [IU] via INTRAVENOUS
  Filled 2013-07-21: qty 5

## 2013-07-21 MED ORDER — FUROSEMIDE 20 MG PO TABS: 20.0000 mg | ORAL_TABLET | Freq: Every day | ORAL | Status: DC

## 2013-07-21 MED ORDER — GADOBENATE DIMEGLUMINE 529 MG/ML IV SOLN
13.0000 mL | Freq: Once | INTRAVENOUS | Status: AC | PRN
  Administered 2013-07-21: 13 mL via INTRAVENOUS

## 2013-07-21 MED ORDER — SODIUM CHLORIDE 0.9 % IJ SOLN
10.0000 mL | INTRAMUSCULAR | Status: DC | PRN
  Administered 2013-07-21: 10 mL via INTRAVENOUS
  Filled 2013-07-21: qty 10

## 2013-07-21 NOTE — Progress Notes (Signed)
Pt left accessed because of pending MRI being done later today. Pt accessed with power port 20g x1in. Antimicrobial disc in place. Op site dressing placed over port per Pt request.

## 2013-07-24 ENCOUNTER — Telehealth: Payer: Self-pay | Admitting: *Deleted

## 2013-07-24 ENCOUNTER — Ambulatory Visit (HOSPITAL_BASED_OUTPATIENT_CLINIC_OR_DEPARTMENT_OTHER): Payer: Medicare HMO | Admitting: Hematology and Oncology

## 2013-07-24 ENCOUNTER — Other Ambulatory Visit (HOSPITAL_BASED_OUTPATIENT_CLINIC_OR_DEPARTMENT_OTHER): Payer: Medicare HMO

## 2013-07-24 ENCOUNTER — Encounter: Payer: Self-pay | Admitting: Hematology and Oncology

## 2013-07-24 VITALS — BP 145/74 | HR 75 | Temp 98.0°F | Resp 19 | Ht 64.0 in | Wt 143.2 lb

## 2013-07-24 DIAGNOSIS — D649 Anemia, unspecified: Secondary | ICD-10-CM

## 2013-07-24 DIAGNOSIS — C3490 Malignant neoplasm of unspecified part of unspecified bronchus or lung: Secondary | ICD-10-CM

## 2013-07-24 DIAGNOSIS — E876 Hypokalemia: Secondary | ICD-10-CM

## 2013-07-24 DIAGNOSIS — R5381 Other malaise: Secondary | ICD-10-CM

## 2013-07-24 DIAGNOSIS — R5383 Other fatigue: Secondary | ICD-10-CM

## 2013-07-24 DIAGNOSIS — F17200 Nicotine dependence, unspecified, uncomplicated: Secondary | ICD-10-CM

## 2013-07-24 DIAGNOSIS — G4700 Insomnia, unspecified: Secondary | ICD-10-CM

## 2013-07-24 DIAGNOSIS — J90 Pleural effusion, not elsewhere classified: Secondary | ICD-10-CM

## 2013-07-24 LAB — MAGNESIUM (CC13): Magnesium: 1.8 mg/dl (ref 1.5–2.5)

## 2013-07-24 LAB — CBC WITH DIFFERENTIAL/PLATELET
BASO%: 0.5 % (ref 0.0–2.0)
Basophils Absolute: 0 10*3/uL (ref 0.0–0.1)
EOS ABS: 0.1 10*3/uL (ref 0.0–0.5)
EOS%: 1.1 % (ref 0.0–7.0)
HEMATOCRIT: 38.8 % (ref 34.8–46.6)
HGB: 12.2 g/dL (ref 11.6–15.9)
LYMPH%: 12.7 % — ABNORMAL LOW (ref 14.0–49.7)
MCH: 24.1 pg — ABNORMAL LOW (ref 25.1–34.0)
MCHC: 31.5 g/dL (ref 31.5–36.0)
MCV: 76.4 fL — ABNORMAL LOW (ref 79.5–101.0)
MONO#: 0.5 10*3/uL (ref 0.1–0.9)
MONO%: 8.3 % (ref 0.0–14.0)
NEUT%: 77.4 % — AB (ref 38.4–76.8)
NEUTROS ABS: 4.7 10*3/uL (ref 1.5–6.5)
Platelets: 221 10*3/uL (ref 145–400)
RBC: 5.08 10*6/uL (ref 3.70–5.45)
RDW: 17.6 % — ABNORMAL HIGH (ref 11.2–14.5)
WBC: 6 10*3/uL (ref 3.9–10.3)
lymph#: 0.8 10*3/uL — ABNORMAL LOW (ref 0.9–3.3)

## 2013-07-24 LAB — COMPREHENSIVE METABOLIC PANEL (CC13)
ALBUMIN: 2.9 g/dL — AB (ref 3.5–5.0)
ALK PHOS: 120 U/L (ref 40–150)
ALT: 8 U/L (ref 0–55)
AST: 10 U/L (ref 5–34)
Anion Gap: 10 mEq/L (ref 3–11)
BILIRUBIN TOTAL: 0.44 mg/dL (ref 0.20–1.20)
BUN: 4.5 mg/dL — AB (ref 7.0–26.0)
CO2: 31 mEq/L — ABNORMAL HIGH (ref 22–29)
Calcium: 9.1 mg/dL (ref 8.4–10.4)
Chloride: 104 mEq/L (ref 98–109)
Creatinine: 0.7 mg/dL (ref 0.6–1.1)
GLUCOSE: 102 mg/dL (ref 70–140)
POTASSIUM: 2.7 meq/L — AB (ref 3.5–5.1)
SODIUM: 145 meq/L (ref 136–145)
TOTAL PROTEIN: 6.6 g/dL (ref 6.4–8.3)

## 2013-07-24 MED ORDER — POTASSIUM CHLORIDE CRYS ER 20 MEQ PO TBCR: 20.0000 meq | EXTENDED_RELEASE_TABLET | Freq: Two times a day (BID) | ORAL | Status: DC

## 2013-07-24 NOTE — Progress Notes (Signed)
[facility] OFFICE PROGRESS NOTE  Patient Care Team: [name], MD as PCP - General (Family Medicine) Gaye Pollack, MD (Cardiothoracic Surgery) Thea Silversmith, MD (Radiation Oncology) Dickie La, MD (Family Medicine) Heath Lark, MD as Consulting Physician (Hematology and Oncology)  DIAGNOSIS: Stage IV metastatic lung cancer  SUMMARY OF ONCOLOGIC HISTORY: Oncology History   Colon cancer   Primary site: Colon and Rectum (Left)   Staging method: AJCC 7th Edition   Clinical: Stage I (T2, N0, M0) signed by Heath Lark, MD on 05/31/2013  2:39 PM   Pathologic: Stage I (T2, N0, cM0) signed by Heath Lark, MD on 05/31/2013  2:39 PM   Summary: Stage I (T2, N0, cM0) Lung cancer, EGFR/ALK negative, recurrence after initial resection to LN and brain   Primary site: Lung (Left)   Staging method: AJCC 7th Edition   Clinical: Stage IV (T1, N2, M1b) signed by Heath Lark, MD on 05/31/2013  2:26 PM   Pathologic: Stage IV (T1, N2, M1b) signed by Heath Lark, MD on 05/31/2013  2:26 PM   Summary: Stage IV (T1, N2, M1b)       Lung cancer   03/01/2007 Procedure Colonoscopy revealed abnormalities and biopsy show high-grade dysplasia   04/01/2007 Surgery She underwent sigmoid resection which showed T2 N0 colon cancer, and negative margins and all of 17 lymph nodes were negative   02/29/2008 Procedure Repeat surveillance colonoscopy was negative.   06/16/2010 Surgery She underwent left upper lobectomy we show well-differentiated adenocarcinoma of the lung, T1, N0, M0   03/18/2011 Procedure Repeat colonoscopy show multiple polyps but there were benign   10/19/2011 Procedure Biopsy of mediastinal lymph node came back positive for recurrence of lung cancer, EGFR and ALK negative   11/16/2011 - 12/14/2011 Chemotherapy She received concurrent chemoradiation therapy with weekly carboplatin and Taxol.   11/16/2011 - 12/29/2011 Radiation Therapy She received radiation therapy with weekly chemotherapy   02/15/2012 Imaging MR of the brain showed a new intracranial metastases. This was subsequently treated with stereotactic radiosurgery.   03/07/2012 - 04/12/2013 Chemotherapy She received chemotherapy with maintainence Alimta every 3 weeks. Chemotherapy was discontinued due to profound fatigue   03/02/2013 Procedure She had therapeutic ultrasound guidance thoracentesis for pleural effusion that came back negative for cancer   05/04/2013 Procedure She had repeat ultrasound-guided thoracentesis again and cytology was negative   05/29/2013 Imaging Repeat CT scan of the chest, abdomen and pelvis show no evidence of disease but persistent right-sided pleural effusion   06/02/2013 Surgery The patient had placement of Pleurx catheter and subsequently underwent pleurodesis.    INTERVAL HISTORY: Lauren WISINSKI 63 y.o. female returns for further followup. She denies any cough. Her shortness of breath has improved. The patient continued to smoke and unwilling to quit. Her fatigue is improving since she is off treatment. She denies any recent fever, chills, night sweats or abnormal weight loss  I have reviewed the past medical history, past surgical history, social history and family history with the patient and they are unchanged from previous note.  ALLERGIES:  is allergic to adhesive and lisinopril.  MEDICATIONS:  Current Outpatient Prescriptions  Medication Sig Dispense Refill  . albuterol (PROVENTIL HFA;VENTOLIN HFA) 108 (90 BASE) MCG/ACT inhaler Inhale 1 puff into the lungs 2 (two) times daily.      Marland Kitchen aspirin 81 MG chewable tablet Chew 81 mg by mouth daily.      . cloNIDine (CATAPRES) 0.1 MG tablet Take 1 tablet (0.1 mg total) by  mouth 2 (two) times daily.  60 tablet  5  . diclofenac sodium (VOLTAREN) 1 % GEL Apply 2 g topically 4 (four) times daily. As needed for pain  924 g  11  . folic acid (FOLVITE) 1 MG tablet Take 1 tablet (1 mg total) by mouth daily.  30 tablet  0  . furosemide (LASIX)  20 MG tablet Take 1 tablet (20 mg total) by mouth daily.  90 tablet  4  . lidocaine-prilocaine (EMLA) cream Apply topically as needed. Apply to porta cath site one hour prior to needle stick.  30 g  0  . magnesium oxide (MAG-OX) 400 (241.3 MG) MG tablet Take 1 tablet (400 mg total) by mouth daily.  30 tablet  1  . Melatonin 3 MG CAPS Take by mouth at bedtime as needed.      . nebivolol (BYSTOLIC) 10 MG tablet Take 10 mg by mouth daily.      . nitroGLYCERIN (NITROSTAT) 0.4 MG SL tablet Place 1 tablet (0.4 mg total) under the tongue every 5 (five) minutes as needed. For chest pain  10 tablet  0  . omeprazole (PRILOSEC) 40 MG capsule Take 1 capsule (40 mg total) by mouth daily.  90 capsule  2  . potassium chloride SA (K-DUR,KLOR-CON) 20 MEQ tablet Take 1 tablet (20 mEq total) by mouth daily.  30 tablet  0  . pravastatin (PRAVACHOL) 40 MG tablet TAKE 1 TABLET BY MOUTH ONCE DAILY  30 tablet  11  . tiZANidine (ZANAFLEX) 2 MG tablet Take 1 tablet (2 mg total) by mouth every 6 (six) hours as needed (Back Muscle Spasm).  30 tablet  3  . [DISCONTINUED] buPROPion (WELLBUTRIN XL) 150 MG 24 hr tablet Take 1 tablet (150 mg total) by mouth 2 (two) times daily.  60 tablet  5   No current facility-administered medications for this visit.    REVIEW OF SYSTEMS:   Constitutional: Denies fevers, chills or abnormal weight loss Eyes: Denies blurriness of vision Ears, nose, mouth, throat, and face: Denies mucositis or sore throat Respiratory: Denies cough, dyspnea or wheezes Cardiovascular: Denies palpitation, chest discomfort or lower extremity swelling Gastrointestinal:  Denies nausea, heartburn or change in bowel habits Skin: Denies abnormal skin rashes Lymphatics: Denies new lymphadenopathy or easy bruising Neurological:Denies numbness, tingling or new weaknesses Behavioral/Psych: Mood is stable, no new changes  All other systems were reviewed with the patient and are negative.  PHYSICAL EXAMINATION: ECOG  PERFORMANCE STATUS: 1 - Symptomatic but completely ambulatory  Filed Vitals:   07/24/13 1204  BP: 145/74  Pulse: 75  Temp: 98 F (36.7 C)  Resp: 19   Filed Weights   07/24/13 1204  Weight: 143 lb 3.2 oz (64.955 kg)    GENERAL:alert, no distress and comfortable SKIN: skin color, texture, turgor are normal, no rashes or significant lesions EYES: normal, Conjunctiva are pink and non-injected, sclera clear OROPHARYNX:no exudate, no erythema and lips, buccal mucosa, and tongue normal  NECK: supple, thyroid normal size, non-tender, without nodularity LYMPH:  no palpable lymphadenopathy in the cervical, axillary or inguinal LUNGS: clear to auscultation and percussion with normal breathing effort HEART: regular rate & rhythm and no murmurs and no lower extremity edema ABDOMEN:abdomen soft, non-tender and normal bowel sounds Musculoskeletal:no cyanosis of digits and no clubbing  NEURO: alert & oriented x 3 with fluent speech, no focal motor/sensory deficits  LABORATORY DATA:  I have reviewed the data as listed    Component Value Date/Time   NA 142 06/12/2013  0625   NA 146* 05/29/2013 0926   NA 143 09/28/2011 0833   K 4.1 06/12/2013 0625   K 2.9 Repeated and Verified* 05/29/2013 0926   K 3.5 09/28/2011 0833   CL 104 06/12/2013 0625   CL 102 10/24/2012 1001   CL 98 09/28/2011 0833   CO2 27 06/12/2013 0625   CO2 24 05/29/2013 0926   CO2 30 09/28/2011 0833   GLUCOSE 107* 06/12/2013 0625   GLUCOSE 88 05/29/2013 0926   GLUCOSE 111* 10/24/2012 1001   GLUCOSE 112 09/28/2011 0833   BUN 8 06/12/2013 0625   BUN 4.4* 05/29/2013 0926   BUN 9 09/28/2011 0833   CREATININE 0.82 06/12/2013 0625   CREATININE 0.6 05/29/2013 0926   CREATININE 0.7 09/28/2011 0833   CALCIUM 9.2 06/12/2013 0625   CALCIUM 7.5* 05/29/2013 0926   CALCIUM 8.7 09/28/2011 0833   PROT 6.8 06/12/2013 0625   PROT 5.1* 05/29/2013 0926   PROT 7.2 09/28/2011 0833   ALBUMIN 3.1* 06/12/2013 0625   ALBUMIN 2.3* 05/29/2013 0926   AST 12 06/12/2013 0625    AST 11 05/29/2013 0926   AST 15 09/28/2011 0833   ALT 7 06/12/2013 0625   ALT 11 05/29/2013 0926   ALT 16 09/28/2011 0833   ALKPHOS 114 06/12/2013 0625   ALKPHOS 84 05/29/2013 0926   ALKPHOS 99* 09/28/2011 0833   BILITOT 0.5 06/12/2013 0625   BILITOT 0.51 05/29/2013 0926   BILITOT 0.80 09/28/2011 0833   GFRNONAA 74* 06/12/2013 0625   GFRAA 86* 06/12/2013 0625    No results found for this basename: SPEP,  UPEP,   kappa and lambda light chains    Lab Results  Component Value Date   WBC 6.0 07/24/2013   NEUTROABS 4.7 07/24/2013   HGB 12.2 07/24/2013   HCT 38.8 07/24/2013   MCV 76.4* 07/24/2013   PLT 221 07/24/2013      Chemistry      Component Value Date/Time   NA 142 06/12/2013 0625   NA 146* 05/29/2013 0926   NA 143 09/28/2011 0833   K 4.1 06/12/2013 0625   K 2.9 Repeated and Verified* 05/29/2013 0926   K 3.5 09/28/2011 0833   CL 104 06/12/2013 0625   CL 102 10/24/2012 1001   CL 98 09/28/2011 0833   CO2 27 06/12/2013 0625   CO2 24 05/29/2013 0926   CO2 30 09/28/2011 0833   BUN 8 06/12/2013 0625   BUN 4.4* 05/29/2013 0926   BUN 9 09/28/2011 0833   CREATININE 0.82 06/12/2013 0625   CREATININE 0.6 05/29/2013 0926   CREATININE 0.7 09/28/2011 0833      Component Value Date/Time   CALCIUM 9.2 06/12/2013 0625   CALCIUM 7.5* 05/29/2013 0926   CALCIUM 8.7 09/28/2011 0833   ALKPHOS 114 06/12/2013 0625   ALKPHOS 84 05/29/2013 0926   ALKPHOS 99* 09/28/2011 0833   AST 12 06/12/2013 0625   AST 11 05/29/2013 0926   AST 15 09/28/2011 0833   ALT 7 06/12/2013 0625   ALT 11 05/29/2013 0926   ALT 16 09/28/2011 0833   BILITOT 0.5 06/12/2013 0625   BILITOT 0.51 05/29/2013 0926   BILITOT 0.80 09/28/2011 0833       RADIOGRAPHIC STUDIES: Her most recent chest x-ray show persistent pleural effusion I have personally reviewed the radiological images as listed and agreed with the findings in the report. Her most recent MRI show no evidence of recurrence of metastatic disease.  ASSESSMENT & PLAN:  #1 stage IV lung cancer  The  patient had profound fatigue while on treatment and. Certainly chemotherapy can cause fatigue. I think the  major problem with her is she continued to smoke. The patient is at peace with the idea to continuing chemotherapy holiday. Her most recent MRI show no evidence of metastatic disease in the brain. Her last CT scan was in January and I recommend another CT scan in May, 4 months since her last imaging study. She is in agreement. The patient is aware if she experience any signs of recurrence of chest pain, shortness of breath a progressive decline in performance status, she will call me sooner and I will repeat imaging studies sooner than 4 months. #2 recurrent pleural effusion Pathology show no evidence of malignant cells.  She has undergone pleurocentesis and currently asymptomatic. #3 chronic fatigue This could be due to increased work of breathing from pleural effusion. It is actually better since I saw her this we stopped treatment #4 cigarette smoking I spent a lot of time educating the patient importance of nicotine cessation. She's not willing to quit #5 severe hypokalemia Causes unknown. I recommend magnesium supplement as well as potassium supplements. Repeat blood work from today is pending #6 insomnia I recommend she take Zanaflex at nighttime or over-the-counter Benadryl at nighttime. #7 anemia This is likely anemia of chronic disease. The patient denies recent history of bleeding such as epistaxis, hematuria or hematochezia. She is asymptomatic from the anemia. We will observe for now.  She does not require transfusion now.  #8 brain metastasis There is no evidence of recurrence. I gave her a letter to support the use of a shower chair due to persistent weakness and risk of fall. #9 venous access She has a port flush recently. I will continue port flush on a monthly basis.  Orders Placed This Encounter  Procedures  . CT Abdomen Pelvis W Contrast    Standing Status: Future      Number of Occurrences:      Standing Expiration Date: 10/24/2014    Order Specific Question:  Reason for Exam (SYMPTOM  OR DIAGNOSIS REQUIRED)    Answer:  hx lung ca, r/o recurrence    Order Specific Question:  Preferred imaging location?    Answer:  [facility]  . CT Chest W Contrast    Standing Status: Future     Number of Occurrences:      Standing Expiration Date: 09/23/2014    Order Specific Question:  Reason for Exam (SYMPTOM  OR DIAGNOSIS REQUIRED)    Answer:  lung ca, r/o recurrence    Order Specific Question:  Preferred imaging location?    Answer:  [facility]  . CBC with Differential    Standing Status: Future     Number of Occurrences:      Standing Expiration Date: 07/24/2014  . Comprehensive metabolic panel    Standing Status: Future     Number of Occurrences:      Standing Expiration Date: 07/24/2014   All questions were answered. The patient knows to call the clinic with any problems, questions or concerns. No barriers to learning was detected. I spent 40 minutes counseling the patient face to face. The total time spent in the appointment was 55 minutes and more than 50% was on counseling and review of test results     [facility], [id number], MD 07/24/2013 12:40 PM

## 2013-07-24 NOTE — Telephone Encounter (Signed)
appts made and printed. Pt is aware that cs will call w/ appt for CT chest/abd/ pelvis....td

## 2013-07-24 NOTE — Telephone Encounter (Signed)
Informed pt of low Potassium level and asked if she has been taking her potassium.  Pt states has not taken potassium "in a while."  Instructed pt per Dr. Alvy Bimler, to take Potassium 20 Meq twice daily. She verbalized understanding.  New rx for 90 day supply sent to [facility] per pt request.

## 2013-07-25 ENCOUNTER — Encounter: Payer: Self-pay | Admitting: Radiation Oncology

## 2013-07-25 ENCOUNTER — Ambulatory Visit
Admission: RE | Admit: 2013-07-25 | Discharge: 2013-07-25 | Disposition: A | Discharge status: Home / Self Care | Payer: Medicare HMO | Source: Ambulatory Visit | Attending: Radiation Oncology | Admitting: Radiation Oncology

## 2013-07-25 VITALS — BP 150/75 | HR 79 | Temp 98.0°F | Ht 64.0 in | Wt 142.7 lb

## 2013-07-25 DIAGNOSIS — C7949 Secondary malignant neoplasm of other parts of nervous system: Principal | ICD-10-CM

## 2013-07-25 DIAGNOSIS — C7931 Secondary malignant neoplasm of brain: Secondary | ICD-10-CM

## 2013-07-25 NOTE — Progress Notes (Signed)
Radiation Oncology         [phone] ________________________________  Name: Lauren [name] MRN: [id number]  Date: 07/25/2013  DOB: 1950/10/23  Follow-Up Visit Note  Outpatient  CC: Lauren Nakayama, MD  Lauren Morita, MD  Diagnosis and Prior Radiotherapy:   Metastatic non-small cell lung cancer, adenocarcinoma, with 2 brain metastases. Left occipital and right insular metastases  Interval Since Last Radiation: She completed 20 Gray in 1 fraction to both of the metastases on 03/01/2012    Narrative:  The patient returns today for routine follow-up. The patient had profound fatigue while on  chemotherapy and is continuing chemotherapy holiday.  Her last CT scan was in January showed stable findings in the chest, and no evidence of metastatic disease in the A/P. She has a Pleurx with a drain in right chest, but the drainage bag was removed since she has minimal results. No malignancy found in cytology from pleural fluid. She denies any pain. She continues to smoke and has no plans to stop at this time  She will repeat her CT scan in May and see Lauren [name].                            MRI of brain was discussed at [facility] tumor board, and it is negative for new lesions. The left occipital lobe lesion is 56mm and stable, and there is no further sign of the right insular lesion.  ALLERGIES:  is allergic to adhesive and lisinopril.  Meds: Current Outpatient Prescriptions  Medication Sig Dispense Refill  . albuterol (PROVENTIL HFA;VENTOLIN HFA) 108 (90 BASE) MCG/ACT inhaler Inhale 1 puff into the lungs 2 (two) times daily.      Marland Kitchen aspirin 81 MG chewable tablet Chew 81 mg by mouth daily.      . cloNIDine (CATAPRES) 0.1 MG tablet Take 1 tablet (0.1 mg total) by mouth 2 (two) times daily.  60 tablet  5  . diclofenac sodium (VOLTAREN) 1 % GEL Apply 2 g topically 4 (four) times daily. As needed for pain  062 g  11  . folic acid (FOLVITE) 1 MG tablet Take 1 tablet (1 mg total) by mouth daily.   30 tablet  0  . furosemide (LASIX) 20 MG tablet Take 1 tablet (20 mg total) by mouth daily.  90 tablet  4  . lidocaine-prilocaine (EMLA) cream Apply topically as needed. Apply to porta cath site one hour prior to needle stick.  30 g  0  . magnesium oxide (MAG-OX) 400 (241.3 MG) MG tablet Take 1 tablet (400 mg total) by mouth daily.  30 tablet  1  . Melatonin 3 MG CAPS Take by mouth at bedtime as needed.      . nebivolol (BYSTOLIC) 10 MG tablet Take 10 mg by mouth daily.      . nitroGLYCERIN (NITROSTAT) 0.4 MG SL tablet Place 1 tablet (0.4 mg total) under the tongue every 5 (five) minutes as needed. For chest pain  10 tablet  0  . omeprazole (PRILOSEC) 40 MG capsule Take 1 capsule (40 mg total) by mouth daily.  90 capsule  2  . potassium chloride SA (K-DUR,KLOR-CON) 20 MEQ tablet Take 1 tablet (20 mEq total) by mouth 2 (two) times daily.  180 tablet  0  . pravastatin (PRAVACHOL) 40 MG tablet TAKE 1 TABLET BY MOUTH ONCE DAILY  30 tablet  11  . tiZANidine (ZANAFLEX) 2 MG tablet Take 1 tablet (2  mg total) by mouth every 6 (six) hours as needed (Back Muscle Spasm).  30 tablet  3  . traMADol (ULTRAM) 50 MG tablet Take 50 mg by mouth every 6 (six) hours as needed.      . [DISCONTINUED] buPROPion (WELLBUTRIN XL) 150 MG 24 hr tablet Take 1 tablet (150 mg total) by mouth 2 (two) times daily.  60 tablet  5   No current facility-administered medications for this encounter.    Physical Findings: The patient is in no acute distress. Patient is alert and oriented.  height is 5\' 4"  (1.626 m) and weight is 142 lb 11.2 oz (64.728 kg). Her temperature is 98 F (36.7 C). Her blood pressure is 150/75 and her pulse is 79. Her oxygen saturation is 100%. .    General: Alert and oriented, in no acute distress HEENT: Head is normocephalic.  Extraocular movements are intact. Oropharynx is clear. Heart: Regular in rate and rhythm with no murmurs, rubs, or gallops. Chest: Decreased breath sounds on right, no rhonchi,  wheezes, or rales. Musculoskeletal: symmetric strength and muscle tone throughout. Neurologic: Cranial nerves II through XII are grossly intact. No obvious focalities. Speech is fluent. Coordination is intact. Psychiatric: Judgment and insight are intact. Affect is appropriate.   Lab Findings: Lab Results  Component Value Date   WBC 6.0 07/24/2013   HGB 12.2 07/24/2013   HCT 38.8 07/24/2013   MCV 76.4* 07/24/2013   PLT 221 07/24/2013    Radiographic Findings: Dg Chest 2 View  07/19/2013   CLINICAL DATA:  History of metastatic lung carcinoma with recurrent right pleural effusion, follow-up .  EXAM: CHEST  2 VIEW  COMPARISON:  Chest x-ray of 06/21/2013  FINDINGS: There is little change in the small right pleural effusion after the removal of the PleurX catheter. No pneumothorax is seen. Mild linear scarring at the lung bases remains. A right-sided power port Port-A-Cath is unchanged with the tip in the lower SVC near the expected SVC -RA junction. Somewhat prominent right paratracheal soft tissues remain.  IMPRESSION: No change in volume of the small right pleural effusion after removal of the PleurX catheter on the right.   Electronically Signed   By: Ivar Drape M.D.   On: 07/19/2013 12:25   Mr Jeri Cos UD Contrast  07/21/2013   CLINICAL DATA:  Lung cancer metastases.  Status post SRS.  EXAM: MRI HEAD WITHOUT AND WITH CONTRAST  TECHNIQUE: Multiplanar, multiecho pulse sequences of the brain and surrounding structures were obtained without and with intravenous contrast.  CONTRAST:  32mL MULTIHANCE GADOBENATE DIMEGLUMINE 529 MG/ML IV SOLN  COMPARISON:  MRI brain 03/13/2013.  FINDINGS: A 4 mm cortical lesion in the left occipital lobe is stable. The previously seen lesion subjacent to the right insular cortex is no longer present. The postcontrast images demonstrate no other pathologic enhancement. Ventricles are of normal size. No significant extra-axial fluid collection is present. Mild periventricular and  subcortical white matter changes are present bilaterally.  Flow is present in the major intracranial arteries. The globes and orbits are intact. The paranasal sinuses are clear. There is some fluid in the left mastoid air cells. No obstructing nasopharyngeal lesion is evident.  IMPRESSION: 1. Stable 4 mm lesion within the left occipital lobe. 2. No other foci of enhancement to suggest metastatic disease. 3. Minimal white matter disease is stable.   Electronically Signed   By: Lawrence Santiago M.D.   On: 07/21/2013 13:44    Impression/Plan:  Doing well, with no recurrence  in the brain almost 1.5 years s/p SRS for 2 metastases.  The patient continues to use tobacco. The patient was counseled to stop using tobacco but she has no plans to stop at this time.  F/u with MRI in 4 months, sooner if needed.    _____________________________________   Eppie Gibson, MD

## 2013-07-25 NOTE — Progress Notes (Signed)
Ms. Ponder here today for reassessment c/o SRS for Brain metastasized.  She has a Pleurx with a drain, but the drainage bag was removed since she has minimal results.  She denies any pain.  She continues to smoke and has no plans to stop at this time.

## 2013-07-26 ENCOUNTER — Other Ambulatory Visit: Payer: Self-pay

## 2013-07-26 ENCOUNTER — Ambulatory Visit: Payer: Self-pay | Admitting: Cardiology

## 2013-07-26 ENCOUNTER — Ambulatory Visit (HOSPITAL_COMMUNITY): Payer: Medicare HMO | Attending: Cardiology | Admitting: *Deleted

## 2013-07-26 DIAGNOSIS — I2510 Atherosclerotic heart disease of native coronary artery without angina pectoris: Secondary | ICD-10-CM | POA: Insufficient documentation

## 2013-07-26 DIAGNOSIS — R0602 Shortness of breath: Secondary | ICD-10-CM | POA: Insufficient documentation

## 2013-07-26 NOTE — Progress Notes (Signed)
Echo performed. 

## 2013-07-27 ENCOUNTER — Other Ambulatory Visit (HOSPITAL_COMMUNITY): Payer: Self-pay

## 2013-07-27 ENCOUNTER — Telehealth: Payer: Self-pay | Admitting: *Deleted

## 2013-07-27 NOTE — Telephone Encounter (Signed)
Faxed clarification for K-dur 20 meq  To [facility] : Per Jill Alexanders, desk nurse for Dr. Alvy Bimler  - pt to take   Kdur 20 meq by mouth Twice daily.

## 2013-08-03 ENCOUNTER — Telehealth: Payer: Self-pay | Admitting: *Deleted

## 2013-08-03 NOTE — Telephone Encounter (Signed)
OK to stop Folic acid

## 2013-08-03 NOTE — Telephone Encounter (Signed)
Pt states she received a letter from [facility] questioning the Folic acid prescription. She is wondering if she can stop taking it as she is no longer on chemo. And are there other meds she should stop?

## 2013-08-09 ENCOUNTER — Encounter: Payer: Self-pay | Admitting: Cardiology

## 2013-08-09 ENCOUNTER — Ambulatory Visit (INDEPENDENT_AMBULATORY_CARE_PROVIDER_SITE_OTHER): Payer: Medicare HMO | Admitting: Cardiology

## 2013-08-09 ENCOUNTER — Ambulatory Visit: Payer: Medicare HMO | Admitting: Cardiology

## 2013-08-09 VITALS — BP 124/78 | HR 86 | Ht 64.0 in | Wt 140.0 lb

## 2013-08-09 DIAGNOSIS — J90 Pleural effusion, not elsewhere classified: Secondary | ICD-10-CM

## 2013-08-09 DIAGNOSIS — E876 Hypokalemia: Secondary | ICD-10-CM | POA: Insufficient documentation

## 2013-08-09 HISTORY — DX: Pleural effusion, not elsewhere classified: J90

## 2013-08-09 HISTORY — DX: Hypokalemia: E87.6

## 2013-08-09 LAB — BASIC METABOLIC PANEL
BUN: 6 mg/dL (ref 6–23)
CO2: 28 mEq/L (ref 19–32)
Calcium: 9.2 mg/dL (ref 8.4–10.5)
Chloride: 104 mEq/L (ref 96–112)
Creatinine, Ser: 0.8 mg/dL (ref 0.4–1.2)
GFR: 99.79 mL/min (ref >60.00)
Glucose, Bld: 97 mg/dL (ref 70–99)
Potassium: 3.4 mEq/L — ABNORMAL LOW (ref 3.5–5.1)
Sodium: 139 mEq/L (ref 135–145)

## 2013-08-09 NOTE — Progress Notes (Signed)
Patient ID: HARVEST DEIST, female   DOB: 10/13/1950, 63 y.o.   MRN: [id number]    Patient Name: Lauren [name] Date of Encounter: 08/09/2013  Primary Care Provider:  [name], MD Primary Cardiologist:  Dorothy Spark (prior patient of Dr Verl Blalock)  Problem List   Past Medical History  Diagnosis Date  . Depression   . Hyperlipidemia   . Hypertension   . Lung nodule     FNA ordered for 04/02/10 by HA>pos Ca  . Chronic folliculitis     of groin  . Fibrocystic breast changes   . Family history of trichomonal vaginitis 05/2005  . Postmenopausal   . Depressive disorder   . Hypercholesterolemia   . GERD (gastroesophageal reflux disease)   . Cerebral aneurysm   . Back pain   . Shortness of breath   . COPD (chronic obstructive pulmonary disease)   . Coronary artery disease   . Arthritis   . Weight loss 10/22/2011  . Status post radiation therapy 11/16/11 - 12/29/11    Right Lung and Mediastinum: 60 Gy  . Status post chemotherapy comp. 12/29/11    Carboplatin/Taxol  . Colon cancer 11/08  . Lung cancer 06/16/10    PET scan 04/28/2010; primary: increase in size 02/2010 / Well Differentiated Adenocarcinoma of the lung   . Brain metastases 02/15/12  . S/P radiation therapy 03/01/12    SRS: 1 fraction / 20 Gray each to the Left Occipital Region and to the Right Insular Metastases  . On antineoplastic chemotherapy started 02/2012    [place]  . Fatigue 02/06/2013  . Pleural effusion 05/03/2013   Past Surgical History  Procedure Laterality Date  . Colectomy  03/22/07    Stage 1 pT2 N0, M0 Adenocarcinoma of the sigmoid  colon  . Tubal ligation    . Lung lobectomy  06/16/10    Left Upper Lobectomy  . Hernia repair    . Breast surgery      Bil lumpectomy  . Back surgery    . Mediastinoscopy  10/19/2011    Procedure: MEDIASTINOSCOPY;  Surgeon: Gaye Pollack, MD;  Location: [facility] OR;  Service: Thoracic;  Laterality: N/A;  . Cardiac cath x3    . Chest tube insertion Right  06/12/2013    Procedure: INSERTION PLEURAL DRAINAGE CATHETER;  Surgeon: Gaye Pollack, MD;  Location: [place];  Service: Thoracic;  Laterality: Right;  . Talc pleurodesis Right 06/12/2013    Procedure: Pietro Cassis;  Surgeon: Gaye Pollack, MD;  Location: [place];  Service: Thoracic;  Laterality: Right;    Allergies  Allergies  Allergen Reactions  . Adhesive [Tape] Rash  . 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 She underwent left upper lobectomy on 06/16/2010 for a Stage I (T1a, N0, M0) well differentiated adenocarcinoma of the lung. She developed a brain met in 01/2012 felt to be secondary to her lung cancer and underwent stereotactic radiosurgery followed by chemotherapy. She developed a right pleural effusion in October 2014 and had a thoracentesis removing 1.2 L of fluid that had suspicious atypical cell but not diagnostic of cancer. She had recurrence of the effusion recently and underwent thoracentesis removing 1.3 L of fluid where  cytology was again negative. She has a recurrent right pleural effusion with removal of  1.3 L of fluid on 06/12/13 with talc pleurodesis to prevent further effusions.  The patient states that she resting shortness of breath that has bee stable since she started to have these effusions. She experiences chest wall pain with deep inspiration, she denies LE edema, palpitations or syncope.   The patient is coming after 6 weeks, she feels well, states that her effusion haven't recur. She was advised to take KCl pills for hypokalemia, but she has hard time eating the pills and has poor apetite.  Home Medications  Prior to Admission medications   Medication Sig Start Date End Date Taking? Authorizing Provider    albuterol (PROVENTIL HFA;VENTOLIN HFA) 108 (90 BASE) MCG/ACT inhaler Inhale 1 puff into the lungs 2 (two) times daily.   Yes Historical Provider, MD  aspirin 81 MG chewable tablet Chew 81 mg by mouth daily.   Yes Historical Provider, MD  benzonatate (TESSALON) 200 MG capsule Take 1 capsule (200 mg total) by mouth 2 (two) times daily as needed for cough. 03/23/13  Yes Zigmund Gottron, MD  cloNIDine (CATAPRES) 0.1 MG tablet Take 1 tablet (0.1 mg total) by mouth 2 (two) times daily. 10/25/12  Yes Cletus Gash, MD  cromolyn (OPTICROM) 4 % ophthalmic solution Place 1 drop into both eyes 2 (two) times daily.   Yes Historical Provider, MD  diclofenac sodium (VOLTAREN) 1 % GEL Apply 2 g topically 4 (four) times daily. As needed for pain 10/25/12  Yes Cletus Gash, MD  folic acid (FOLVITE) 1 MG tablet Take 1 mg by mouth daily.   Yes Historical Provider, MD  lidocaine-prilocaine (EMLA) cream Apply topically as needed. Apply to porta cath site one hour prior to needle stick. 04/18/12  Yes Nobie Putnam, MD  magnesium oxide (MAG-OX) 400 (241.3 MG) MG tablet Take 1 tablet (400 mg total) by mouth daily. 05/29/13  Yes Heath Lark, MD  nebivolol (BYSTOLIC) 10 MG tablet Take 10 mg by mouth daily.   Yes Historical Provider, MD  nitroGLYCERIN (NITROSTAT) 0.4 MG SL tablet Place 1 tablet (0.4 mg total) under the tongue every 5 (five) minutes as needed. For chest pain 10/25/12  Yes Cletus Gash, MD  omeprazole (PRILOSEC) 40 MG capsule Take 1 capsule (40 mg total) by mouth daily. 04/04/13  Yes Amber Fidel Levy, MD  potassium chloride SA (K-DUR,KLOR-CON) 20 MEQ tablet Take 20 mEq by mouth daily. 02/20/13  Yes Amber Fidel Levy, MD  pravastatin (PRAVACHOL) 40 MG tablet TAKE 1 TABLET BY MOUTH ONCE DAILY 06/12/13  Yes Amber Fidel Levy, MD  tiZANidine (ZANAFLEX) 2 MG tablet Take 1 tablet (2 mg total) by mouth every 6 (six) hours as needed (Back Muscle Spasm). 08/17/12  Yes Cletus Gash, MD  traMADol (ULTRAM) 50  MG tablet Take 1 tablet (50 mg total) by mouth every 6 (six) hours as needed. As needed for pain. 06/28/12 06/28/13 Yes Cletus Gash, MD    Family History  Family History  Problem Relation Age of Onset  . Stomach cancer Maternal Aunt   . Breast cancer Cousin   . Cancer Sister     Lymphatic  . Osteoarthritis Father   . Gout Father   . Hypertension Father   . Heart disease Mother     pericarditis;   . Anesthesia problems Neg Hx     Social History  History   Social History  . Marital Status: Widowed    Spouse Name: N/A    Number of Children: N/A  . Years of Education: N/A  Occupational History  . 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 Smoking status: Current Every Day Smoker -- 1.00 packs/day    Types: Cigarettes  . Smokeless tobacco: Current User  . Alcohol Use: 0.0 oz/week     Comment: occasional  . Drug Use: No  . Sexual Activity: Not on file   Other Topics Concern  . Not on file   Social History Narrative  . No narrative on file     Review of Systems, as per HPI, otherwise negative General:  No chills, fever, night sweats or weight changes.  Cardiovascular:  No chest pain, dyspnea on exertion, edema, orthopnea, palpitations, paroxysmal nocturnal dyspnea. Dermatological: No rash, lesions/masses Respiratory: No cough, dyspnea Urologic: No hematuria, dysuria Abdominal:   No nausea, vomiting, diarrhea, bright red blood per rectum, melena, or hematemesis Neurologic:  No visual changes, wkns, changes in mental status. All other systems reviewed and are otherwise negative except as noted above.  Physical Exam  Blood pressure 124/78, pulse 86, height '5\' 4"'  (1.626 m), weight 140 lb (63.504 kg).  General: Pleasant, NAD Psych: Normal affect. Neuro: Alert and oriented X 3. Moves all extremities spontaneously. HEENT: Normal  Neck: Supple without bruits or JVD. Lungs:  Resp regular and unlabored, CTA. Bandage covering pleural drain.  Heart:  RRR no s3, s4, or murmurs. Abdomen: Soft, non-tender, non-distended, BS + x 4.  Extremities: No clubbing, cyanosis or edema. DP/PT/Radials 2+ and equal bilaterally.  Labs:  No results found for this basename: CKTOTAL, CKMB, TROPONINI,  in the last 72 hours Lab Results  Component Value Date   WBC 6.0 07/24/2013   HGB 12.2 07/24/2013   HCT 38.8 07/24/2013   MCV 76.4* 07/24/2013   PLT 221 07/24/2013   No results found for this basename: NA, K, CL, CO2, BUN, CREATININE, CALCIUM, LABALBU, PROT, BILITOT, ALKPHOS, ALT, AST, GLUCOSE,  in the last 168 hours Lab Results  Component Value Date   CHOL 198 03/21/2012   HDL 53 03/21/2012   LDLCALC 124* 03/21/2012   TRIG 104 03/21/2012   Lab Results  Component Value Date   DDIMER  Value: 0.29        AT THE INHOUSE ESTABLISHED CUTOFF VALUE OF 0.48 ug/mL FEU, THIS ASSAY HAS BEEN DOCUMENTED IN THE LITERATURE TO HAVE A SENSITIVITY AND NEGATIVE PREDICTIVE VALUE OF AT LEAST 98 TO 99%.  THE TEST RESULT SHOULD BE CORRELATED WITH AN ASSESSMENT OF THE CLINICAL PROBABILITY OF DVT / VTE. 06/03/2009   Accessory Clinical Findings  Echocardiogram - none  ECG - 03/02/2013 SR, nonspecific T wave abnormalities.    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 Despite high suspicion for malignant effusion, it has never been proven We will evaluate for possible heart failure as a cause of recurrent right sided pleural effusions.   The echocardiogram shows preserved biventricular function and chamber size. No signs of heart failure. Normal RVSP.  We will continue the same low dose of Lasix 20 mg po daily. We will repeat BMP today for hypokalemia.  She was advised again to take KCl pills and eat food rich in K such as bananas or orange juice.  Follow up in 6 months.   Dorothy Spark, MD, [facility] 08/09/2013, 8:00 AM

## 2013-08-09 NOTE — Patient Instructions (Signed)
Your physician recommends that you return for lab work today for bmet.  Your physician recommends that you continue on your current medications as directed. Please refer to the Current Medication list given to you today.  Your physician wants you to follow-up in: 6 months with Dr. Meda Coffee. You will receive a reminder letter in the mail two months in advance. If you don't receive a letter, please call our office to schedule the follow-up appointment.

## 2013-08-10 ENCOUNTER — Ambulatory Visit: Payer: Self-pay | Admitting: Cardiology

## 2013-08-17 ENCOUNTER — Other Ambulatory Visit: Payer: Self-pay | Admitting: Radiation Therapy

## 2013-08-17 DIAGNOSIS — C7931 Secondary malignant neoplasm of brain: Secondary | ICD-10-CM

## 2013-08-17 DIAGNOSIS — C7949 Secondary malignant neoplasm of other parts of nervous system: Principal | ICD-10-CM

## 2013-08-18 ENCOUNTER — Telehealth: Payer: Self-pay | Admitting: Dietician

## 2013-08-18 NOTE — Telephone Encounter (Signed)
Brief Outpatient Oncology Nutrition Note  Patient has been identified to be at risk on malnutrition screen.  Wt Readings from Last 10 Encounters:  08/09/13 140 lb (63.504 kg)  07/25/13 142 lb 11.2 oz (64.728 kg)  07/24/13 143 lb 3.2 oz (64.955 kg)  07/19/13 140 lb (63.504 kg)  06/21/13 140 lb (63.504 kg)  06/19/13 140 lb (63.504 kg)  06/12/13 144 lb 4 oz (65.431 kg)  06/12/13 144 lb 4 oz (65.431 kg)  06/12/13 144 lb 4 oz (65.431 kg)  06/07/13 150 lb (68.04 kg)    Dx:  Stave IV metastatic adenocarcinoma of the lung to the brain.  Called patient due to weight loss.  Patient reports poor appetite and only able to eat 1 good meal a day.  Usually lunch.  Takes Ensure occasionally but difficult to afford.  Encouraged eating by the clock to make sure she gets in more regular meals.  Will mail coupons for Ensure and tips for a low appetite and "Making the most of each bite."  Contact information for the outpatient [facility] provided.    Antonieta Iba, RD, LDN

## 2013-08-24 ENCOUNTER — Other Ambulatory Visit: Payer: Self-pay

## 2013-08-25 ENCOUNTER — Ambulatory Visit (HOSPITAL_BASED_OUTPATIENT_CLINIC_OR_DEPARTMENT_OTHER): Payer: Commercial Managed Care - HMO

## 2013-08-25 VITALS — BP 151/70 | HR 84 | Temp 97.6°F

## 2013-08-25 DIAGNOSIS — Z452 Encounter for adjustment and management of vascular access device: Secondary | ICD-10-CM

## 2013-08-25 DIAGNOSIS — C3410 Malignant neoplasm of upper lobe, unspecified bronchus or lung: Secondary | ICD-10-CM

## 2013-08-25 DIAGNOSIS — Z95828 Presence of other vascular implants and grafts: Secondary | ICD-10-CM

## 2013-08-25 MED ORDER — SODIUM CHLORIDE 0.9 % IJ SOLN
10.0000 mL | INTRAMUSCULAR | Status: DC | PRN
  Administered 2013-08-25: 10 mL via INTRAVENOUS
  Filled 2013-08-25: qty 10

## 2013-08-25 MED ORDER — HEPARIN SOD (PORK) LOCK FLUSH 100 UNIT/ML IV SOLN
500.0000 [IU] | Freq: Once | INTRAVENOUS | Status: AC
  Administered 2013-08-25: 500 [IU] via INTRAVENOUS
  Filled 2013-08-25: qty 5

## 2013-09-06 ENCOUNTER — Telehealth: Payer: Self-pay | Admitting: *Deleted

## 2013-09-06 NOTE — Telephone Encounter (Signed)
Pt is requesting dates of when she was diagnosed with colon cancer,  when she went into remission,  when she was diagnosed with left lung, right lung and brain cancer.

## 2013-09-20 ENCOUNTER — Other Ambulatory Visit (HOSPITAL_COMMUNITY)
Admission: RE | Admit: 2013-09-20 | Discharge: 2013-09-20 | Disposition: A | Discharge status: Home / Self Care | Payer: Medicare HMO | Source: Ambulatory Visit | Attending: Family Medicine | Admitting: Family Medicine

## 2013-09-20 ENCOUNTER — Ambulatory Visit (INDEPENDENT_AMBULATORY_CARE_PROVIDER_SITE_OTHER): Payer: Medicare HMO | Admitting: Family Medicine

## 2013-09-20 ENCOUNTER — Encounter: Payer: Self-pay | Admitting: Family Medicine

## 2013-09-20 VITALS — BP 165/83 | HR 85 | Temp 97.8°F | Wt 140.0 lb

## 2013-09-20 DIAGNOSIS — I10 Essential (primary) hypertension: Secondary | ICD-10-CM

## 2013-09-20 DIAGNOSIS — Z124 Encounter for screening for malignant neoplasm of cervix: Secondary | ICD-10-CM

## 2013-09-20 DIAGNOSIS — E7800 Pure hypercholesterolemia, unspecified: Secondary | ICD-10-CM

## 2013-09-20 DIAGNOSIS — IMO0002 Reserved for concepts with insufficient information to code with codable children: Secondary | ICD-10-CM

## 2013-09-20 DIAGNOSIS — G4700 Insomnia, unspecified: Secondary | ICD-10-CM

## 2013-09-20 DIAGNOSIS — R6889 Other general symptoms and signs: Secondary | ICD-10-CM

## 2013-09-20 DIAGNOSIS — C3490 Malignant neoplasm of unspecified part of unspecified bronchus or lung: Secondary | ICD-10-CM

## 2013-09-20 MED ORDER — CLONIDINE HCL 0.1 MG PO TABS: 0.1000 mg | ORAL_TABLET | Freq: Two times a day (BID) | ORAL | Status: DC

## 2013-09-20 MED ORDER — PRAVASTATIN SODIUM 40 MG PO TABS: ORAL_TABLET | ORAL | Status: DC

## 2013-09-20 MED ORDER — NEBIVOLOL HCL 10 MG PO TABS: 10.0000 mg | ORAL_TABLET | Freq: Every day | ORAL | Status: DC

## 2013-09-20 MED ORDER — FUROSEMIDE 20 MG PO TABS
20.0000 mg | ORAL_TABLET | Freq: Every day | ORAL | Status: DC
Start: 2013-09-20 — End: 2014-07-27

## 2013-09-20 MED ORDER — TIZANIDINE HCL 2 MG PO TABS: 2.0000 mg | ORAL_TABLET | Freq: Four times a day (QID) | ORAL | Status: DC | PRN

## 2013-09-20 MED ORDER — OMEPRAZOLE 40 MG PO CPDR: 40.0000 mg | DELAYED_RELEASE_CAPSULE | Freq: Every day | ORAL | Status: DC

## 2013-09-20 MED ORDER — LIDOCAINE-PRILOCAINE 2.5-2.5 % EX CREA: TOPICAL_CREAM | CUTANEOUS | Status: DC | PRN

## 2013-09-20 MED ORDER — ALBUTEROL SULFATE HFA 108 (90 BASE) MCG/ACT IN AERS: 1.0000 | INHALATION_SPRAY | Freq: Two times a day (BID) | RESPIRATORY_TRACT | Status: DC

## 2013-09-20 MED ORDER — MIRTAZAPINE 15 MG PO TABS: 15.0000 mg | ORAL_TABLET | Freq: Every day | ORAL | Status: DC

## 2013-09-20 MED ORDER — DICLOFENAC SODIUM 1 % TD GEL: 2.0000 g | Freq: Four times a day (QID) | TRANSDERMAL | Status: DC

## 2013-09-20 MED ORDER — TRAMADOL HCL 50 MG PO TABS: 50.0000 mg | ORAL_TABLET | Freq: Four times a day (QID) | ORAL | Status: DC | PRN

## 2013-09-20 MED ORDER — FLUOXETINE HCL 20 MG PO TABS: 20.0000 mg | ORAL_TABLET | Freq: Every day | ORAL | Status: DC

## 2013-09-20 NOTE — Progress Notes (Signed)
Patient ID: ISOBELLE TUCKETT, female   DOB: 03/10/1951, 63 y.o.   MRN: [id number]    Subjective: HPI: Patient is a 63 y.o. female presenting to clinic today for CPE. Concerns today include not sleeping  CPE- needs pap today. Last pap in 2013 was ASCUS with HPV. Colpo was unsuccessful and recommended f/u pap in 1-2 years. No vaginal discharge or bleeding. No intercourse. No concerns.  Cancer- Followed by cancer center. Last chemo in November. Feels better. Still smoking and still has chronic cough. No concerns.  Sleep- Patient reports poor sleep for many years. Taking Melatonin at nighttime as well as Tramadol. She falls asleep with no problems, but wakes up between 3-4 and cannot go back to sleep. Tries to not nap during the day. Reports drinking a lot of Pepsi all day but states this is not new for her and thinks this is not an issue.   HLD- Needs lipid panel with next labs at cancer center  HTN- Off medications today. No symptoms, feels well. Needs refills on everything. Slightly elevated to 165/83  History Reviewed: Daily smoker. Health Maintenance: Pap today.   ROS: Please see HPI above.  Objective: Office vital signs reviewed. BP 165/83  Pulse 85  Temp(Src) 97.8 F (36.6 C) (Oral)  Wt 140 lb (63.504 kg)  Physical Examination:  General: Awake, alert. NAD HEENT: Atraumatic, normocephalic. MMM. Hyperpigmented lesions on soft palate Neck: No masses palpated. No LAD Pulm: CTAB, no wheezes Cardio: RRR, no murmurs appreciated Abdomen:+BS, soft, nontender, nondistended GU: Normal external genitalia. Firm nodule on left labia without induration or fluctuance. Cervix visualized and normal. No CMT. Extremities: No edema Neuro: Grossly intact  Assessment: 63 y.o. female CPE  Plan: See Problem List and After Visit Summary

## 2013-09-20 NOTE — Assessment & Plan Note (Signed)
A: CPE performed today  P: - If not high risk will consider stopping all future pap screening. Patient agrees.

## 2013-09-20 NOTE — Assessment & Plan Note (Signed)
A: Long standing problem. Not improved with melatonin  P: - Discussed sleep hygiene - Will try Remeron qhs to help with sleep and appetite - f/u PRN

## 2013-09-20 NOTE — Assessment & Plan Note (Signed)
A: Above goal. Feels well.  P: - Refilled medications - Monitor closely

## 2013-09-20 NOTE — Assessment & Plan Note (Signed)
A: Needs lipid  P: - Recommend screenign with next labs at cancer center - Con't statin

## 2013-09-20 NOTE — Patient Instructions (Signed)
Try taking the Remeron at bedtime for sleep.  Please let your cancer doctor know you need your cholesterol checked at your next labs.  We will let you know the results of your pap smear.  Follow up in 3-6 months, as needed.  Amber M. Hairford, M.D.

## 2013-09-21 ENCOUNTER — Encounter (HOSPITAL_COMMUNITY): Payer: Self-pay | Admitting: Family Medicine

## 2013-09-21 ENCOUNTER — Ambulatory Visit: Payer: Medicare HMO

## 2013-09-22 ENCOUNTER — Other Ambulatory Visit (HOSPITAL_BASED_OUTPATIENT_CLINIC_OR_DEPARTMENT_OTHER): Payer: Medicare HMO

## 2013-09-22 ENCOUNTER — Encounter (HOSPITAL_COMMUNITY): Payer: Self-pay

## 2013-09-22 ENCOUNTER — Ambulatory Visit (HOSPITAL_BASED_OUTPATIENT_CLINIC_OR_DEPARTMENT_OTHER): Payer: Medicare HMO

## 2013-09-22 ENCOUNTER — Ambulatory Visit (HOSPITAL_COMMUNITY)
Admission: RE | Admit: 2013-09-22 | Discharge: 2013-09-22 | Disposition: A | Discharge status: Home / Self Care | Payer: Medicare HMO | Source: Ambulatory Visit | Attending: Hematology and Oncology | Admitting: Hematology and Oncology

## 2013-09-22 VITALS — BP 187/83 | HR 80

## 2013-09-22 DIAGNOSIS — Z452 Encounter for adjustment and management of vascular access device: Secondary | ICD-10-CM

## 2013-09-22 DIAGNOSIS — E876 Hypokalemia: Secondary | ICD-10-CM

## 2013-09-22 DIAGNOSIS — Z95828 Presence of other vascular implants and grafts: Secondary | ICD-10-CM

## 2013-09-22 DIAGNOSIS — C3490 Malignant neoplasm of unspecified part of unspecified bronchus or lung: Secondary | ICD-10-CM

## 2013-09-22 DIAGNOSIS — D649 Anemia, unspecified: Secondary | ICD-10-CM

## 2013-09-22 LAB — COMPREHENSIVE METABOLIC PANEL (CC13)
ANION GAP: 13 meq/L — AB (ref 3–11)
AST: 9 U/L (ref 5–34)
Albumin: 3.3 g/dL — ABNORMAL LOW (ref 3.5–5.0)
Alkaline Phosphatase: 147 U/L (ref 40–150)
BILIRUBIN TOTAL: 0.46 mg/dL (ref 0.20–1.20)
BUN: 8.6 mg/dL (ref 7.0–26.0)
CALCIUM: 9.4 mg/dL (ref 8.4–10.4)
CHLORIDE: 108 meq/L (ref 98–109)
CO2: 23 meq/L (ref 22–29)
Creatinine: 0.8 mg/dL (ref 0.6–1.1)
Glucose: 111 mg/dl (ref 70–140)
Potassium: 3.5 mEq/L (ref 3.5–5.1)
Sodium: 143 mEq/L (ref 136–145)
Total Protein: 6.9 g/dL (ref 6.4–8.3)

## 2013-09-22 LAB — CBC WITH DIFFERENTIAL/PLATELET
BASO%: 0.2 % (ref 0.0–2.0)
Basophils Absolute: 0 10*3/uL (ref 0.0–0.1)
EOS ABS: 0.1 10*3/uL (ref 0.0–0.5)
EOS%: 1.5 % (ref 0.0–7.0)
HEMATOCRIT: 42 % (ref 34.8–46.6)
HGB: 13.3 g/dL (ref 11.6–15.9)
LYMPH#: 0.9 10*3/uL (ref 0.9–3.3)
LYMPH%: 16.5 % (ref 14.0–49.7)
MCH: 24.1 pg — ABNORMAL LOW (ref 25.1–34.0)
MCHC: 31.7 g/dL (ref 31.5–36.0)
MCV: 75.9 fL — ABNORMAL LOW (ref 79.5–101.0)
MONO#: 0.4 10*3/uL (ref 0.1–0.9)
MONO%: 8.4 % (ref 0.0–14.0)
NEUT#: 3.8 10*3/uL (ref 1.5–6.5)
NEUT%: 73.4 % (ref 38.4–76.8)
Platelets: 187 10*3/uL (ref 145–400)
RBC: 5.53 10*6/uL — ABNORMAL HIGH (ref 3.70–5.45)
RDW: 17.3 % — ABNORMAL HIGH (ref 11.2–14.5)
WBC: 5.2 10*3/uL (ref 3.9–10.3)

## 2013-09-22 LAB — LIPID PANEL
Cholesterol: 224 mg/dL — ABNORMAL HIGH (ref 0–200)
HDL: 48 mg/dL (ref >39)
LDL Cholesterol: 155 mg/dL — ABNORMAL HIGH (ref 0–99)
Total CHOL/HDL Ratio: 4.7 Ratio
Triglycerides: 103 mg/dL (ref <150)
VLDL: 21 mg/dL (ref 0–40)

## 2013-09-22 MED ORDER — HEPARIN SOD (PORK) LOCK FLUSH 100 UNIT/ML IV SOLN
500.0000 [IU] | Freq: Once | INTRAVENOUS | Status: AC
  Administered 2013-09-22: 500 [IU] via INTRAVENOUS
  Filled 2013-09-22: qty 5

## 2013-09-22 MED ORDER — SODIUM CHLORIDE 0.9 % IJ SOLN
10.0000 mL | INTRAMUSCULAR | Status: DC | PRN
  Administered 2013-09-22: 10 mL via INTRAVENOUS
  Filled 2013-09-22: qty 10

## 2013-09-22 MED ORDER — IOHEXOL 300 MG/ML  SOLN
50.0000 mL | Freq: Once | INTRAMUSCULAR | Status: AC | PRN
  Administered 2013-09-22: 50 mL via ORAL

## 2013-09-22 MED ORDER — IOHEXOL 300 MG/ML  SOLN
100.0000 mL | Freq: Once | INTRAMUSCULAR | Status: AC | PRN
  Administered 2013-09-22: 100 mL via INTRAVENOUS

## 2013-09-25 ENCOUNTER — Telehealth: Payer: Self-pay | Admitting: Hematology and Oncology

## 2013-09-25 ENCOUNTER — Ambulatory Visit (HOSPITAL_BASED_OUTPATIENT_CLINIC_OR_DEPARTMENT_OTHER): Payer: Commercial Managed Care - HMO | Admitting: Hematology and Oncology

## 2013-09-25 ENCOUNTER — Encounter: Payer: Self-pay | Admitting: Hematology and Oncology

## 2013-09-25 VITALS — BP 189/85 | HR 80 | Temp 98.2°F | Resp 18 | Ht 64.0 in | Wt 144.7 lb

## 2013-09-25 DIAGNOSIS — C3490 Malignant neoplasm of unspecified part of unspecified bronchus or lung: Secondary | ICD-10-CM

## 2013-09-25 DIAGNOSIS — C3410 Malignant neoplasm of upper lobe, unspecified bronchus or lung: Secondary | ICD-10-CM

## 2013-09-25 DIAGNOSIS — R5382 Chronic fatigue, unspecified: Secondary | ICD-10-CM

## 2013-09-25 DIAGNOSIS — C7931 Secondary malignant neoplasm of brain: Secondary | ICD-10-CM

## 2013-09-25 DIAGNOSIS — C7949 Secondary malignant neoplasm of other parts of nervous system: Secondary | ICD-10-CM

## 2013-09-25 DIAGNOSIS — G4700 Insomnia, unspecified: Secondary | ICD-10-CM

## 2013-09-25 DIAGNOSIS — J90 Pleural effusion, not elsewhere classified: Secondary | ICD-10-CM

## 2013-09-25 DIAGNOSIS — G9332 Myalgic encephalomyelitis/chronic fatigue syndrome: Secondary | ICD-10-CM

## 2013-09-25 NOTE — Telephone Encounter (Signed)
Gave pt appt for lab and MD for july  and August 2015

## 2013-09-25 NOTE — Progress Notes (Signed)
[facility] OFFICE PROGRESS NOTE  Patient Care Team: [name], MD as PCP - General (Family Medicine) Gaye Pollack, MD (Cardiothoracic Surgery) Thea Silversmith, MD (Radiation Oncology) Dickie La, MD (Family Medicine) Heath Lark, MD as Consulting Physician (Hematology and Oncology)  DIAGNOSIS: Stage IV lung cancer, no evidence of active disease  SUMMARY OF ONCOLOGIC HISTORY: Oncology History   Colon cancer   Primary site: Colon and Rectum (Left)   Staging method: AJCC 7th Edition   Clinical: Stage I (T2, N0, M0) signed by Heath Lark, MD on 05/31/2013  2:39 PM   Pathologic: Stage I (T2, N0, cM0) signed by Heath Lark, MD on 05/31/2013  2:39 PM   Summary: Stage I (T2, N0, cM0) Lung cancer, EGFR/ALK negative, recurrence after initial resection to LN and brain   Primary site: Lung (Left)   Staging method: AJCC 7th Edition   Clinical: Stage IV (T1, N2, M1b) signed by Heath Lark, MD on 05/31/2013  2:26 PM   Pathologic: Stage IV (T1, N2, M1b) signed by Heath Lark, MD on 05/31/2013  2:26 PM   Summary: Stage IV (T1, N2, M1b)       Lung cancer   03/01/2007 Procedure Colonoscopy revealed abnormalities and biopsy show high-grade dysplasia   04/01/2007 Surgery She underwent sigmoid resection which showed T2 N0 colon cancer, and negative margins and all of 17 lymph nodes were negative   02/29/2008 Procedure Repeat surveillance colonoscopy was negative.   06/16/2010 Surgery She underwent left upper lobectomy we show well-differentiated adenocarcinoma of the lung, T1, N0, M0   03/18/2011 Procedure Repeat colonoscopy show multiple polyps but there were benign   10/19/2011 Procedure Biopsy of mediastinal lymph node came back positive for recurrence of lung cancer, EGFR and ALK negative   11/16/2011 - 12/14/2011 Chemotherapy She received concurrent chemoradiation therapy with weekly carboplatin and Taxol.   11/16/2011 - 12/29/2011 Radiation Therapy She received radiation therapy with  weekly chemotherapy   02/15/2012 Imaging MR of the brain showed a new intracranial metastases. This was subsequently treated with stereotactic radiosurgery.   03/07/2012 - 04/12/2013 Chemotherapy She received chemotherapy with maintainence Alimta every 3 weeks. Chemotherapy was discontinued due to profound fatigue   03/02/2013 Procedure She had therapeutic ultrasound guidance thoracentesis for pleural effusion that came back negative for cancer   05/04/2013 Procedure She had repeat ultrasound-guided thoracentesis again and cytology was negative   05/29/2013 Imaging Repeat CT scan of the chest, abdomen and pelvis show no evidence of disease but persistent right-sided pleural effusion   06/02/2013 Surgery The patient had placement of Pleurx catheter and subsequently underwent pleurodesis.   09/22/2013 Imaging Repeat CT scan show no evidence of active disease. There are nonspecific lymphadenopathy and she is placed on observation.    INTERVAL HISTORY: Lauren [name] 63 y.o. female returns for further followup. She has excellent energy level in the last 6 months since we put her on chemotherapy holiday. She denies any fatigue, chest pain or shortness of breath. The patient continued to smoke and unable to quit and unwilling to quit smoking. She denies any headaches. No new neurological deficit. She has no new change in bowel habits.  I have reviewed the past medical history, past surgical history, social history and family history with the patient and they are unchanged from previous note.  ALLERGIES:  is allergic to adhesive and lisinopril.  MEDICATIONS:  Current Outpatient Prescriptions  Medication Sig Dispense Refill  . albuterol (PROVENTIL HFA;VENTOLIN HFA) 108 (90 BASE) MCG/ACT inhaler  Inhale 1 puff into the lungs 2 (two) times daily.  6.7 g  prn  . aspirin 81 MG chewable tablet Chew 81 mg by mouth daily.      . cloNIDine (CATAPRES) 0.1 MG tablet Take 1 tablet (0.1 mg total) by mouth 2  (two) times daily.  180 tablet  3  . diclofenac sodium (VOLTAREN) 1 % GEL Apply 2 g topically 4 (four) times daily. As needed for pain  100 g  11  . FLUoxetine (PROZAC) 20 MG tablet Take 1 tablet (20 mg total) by mouth daily.  90 tablet  3  . furosemide (LASIX) 20 MG tablet Take 1 tablet (20 mg total) by mouth daily.  90 tablet  3  . lidocaine-prilocaine (EMLA) cream Apply topically as needed. Apply to porta cath site one hour prior to needle stick.  30 g  prn  . magnesium oxide (MAG-OX) 400 (241.3 MG) MG tablet Take 1 tablet (400 mg total) by mouth daily.  30 tablet  1  . mirtazapine (REMERON) 15 MG tablet Take 1 tablet (15 mg total) by mouth at bedtime.  90 tablet  0  . nebivolol (BYSTOLIC) 10 MG tablet Take 1 tablet (10 mg total) by mouth daily.  90 tablet  4  . nitroGLYCERIN (NITROSTAT) 0.4 MG SL tablet Place 1 tablet (0.4 mg total) under the tongue every 5 (five) minutes as needed. For chest pain  10 tablet  0  . omeprazole (PRILOSEC) 40 MG capsule Take 1 capsule (40 mg total) by mouth daily.  90 capsule  3  . potassium chloride SA (K-DUR,KLOR-CON) 20 MEQ tablet Take 1 tablet (20 mEq total) by mouth 2 (two) times daily.  180 tablet  0  . pravastatin (PRAVACHOL) 40 MG tablet TAKE 1 TABLET BY MOUTH ONCE DAILY  90 tablet  3  . tiZANidine (ZANAFLEX) 2 MG tablet Take 1 tablet (2 mg total) by mouth every 6 (six) hours as needed (Back Muscle Spasm).  30 tablet  3  . traMADol (ULTRAM) 50 MG tablet Take 1 tablet (50 mg total) by mouth every 6 (six) hours as needed.  60 tablet  2  . [DISCONTINUED] buPROPion (WELLBUTRIN XL) 150 MG 24 hr tablet Take 1 tablet (150 mg total) by mouth 2 (two) times daily.  60 tablet  5   No current facility-administered medications for this visit.    REVIEW OF SYSTEMS:   Constitutional: Denies fevers, chills or abnormal weight loss Eyes: Denies blurriness of vision Ears, nose, mouth, throat, and face: Denies mucositis or sore throat Respiratory: Denies cough, dyspnea or  wheezes Cardiovascular: Denies palpitation, chest discomfort or lower extremity swelling Gastrointestinal:  Denies nausea, heartburn or change in bowel habits Skin: Denies abnormal skin rashes Lymphatics: Denies new lymphadenopathy or easy bruising Neurological:Denies numbness, tingling or new weaknesses Behavioral/Psych: Mood is stable, no new changes  All other systems were reviewed with the patient and are negative.  PHYSICAL EXAMINATION: ECOG PERFORMANCE STATUS: 0 - Asymptomatic  Filed Vitals:   09/25/13 1159  BP: 189/85  Pulse: 80  Temp: 98.2 F (36.8 C)  Resp: 18   Filed Weights   09/25/13 1159  Weight: 144 lb 11.2 oz (65.635 kg)    GENERAL:alert, no distress and comfortable SKIN: skin color, texture, turgor are normal, no rashes or significant lesions EYES: normal, Conjunctiva are pink and non-injected, sclera clear OROPHARYNX:no exudate, no erythema and lips, buccal mucosa, and tongue normal  NECK: supple, thyroid normal size, non-tender, without nodularity LYMPH:  no palpable  lymphadenopathy in the cervical, axillary or inguinal LUNGS: clear to auscultation and percussion with normal breathing effort HEART: regular rate & rhythm and no murmurs and no lower extremity edema ABDOMEN:abdomen soft, non-tender and normal bowel sounds Musculoskeletal:no cyanosis of digits and no clubbing  NEURO: alert & oriented x 3 with fluent speech, no focal motor/sensory deficits  LABORATORY DATA:  I have reviewed the data as listed    Component Value Date/Time   NA 143 09/22/2013 0837   NA 139 08/09/2013 0817   NA 143 09/28/2011 0833   K 3.5 09/22/2013 0837   K 3.4* 08/09/2013 0817   K 3.5 09/28/2011 0833   CL 104 08/09/2013 0817   CL 102 10/24/2012 1001   CL 98 09/28/2011 0833   CO2 23 09/22/2013 0837   CO2 28 08/09/2013 0817   CO2 30 09/28/2011 0833   GLUCOSE 111 09/22/2013 0837   GLUCOSE 97 08/09/2013 0817   GLUCOSE 111* 10/24/2012 1001   GLUCOSE 112 09/28/2011 0833   BUN 8.6 09/22/2013  0837   BUN 6 08/09/2013 0817   BUN 9 09/28/2011 0833   CREATININE 0.8 09/22/2013 0837   CREATININE 0.8 08/09/2013 0817   CREATININE 0.7 09/28/2011 0833   CALCIUM 9.4 09/22/2013 0837   CALCIUM 9.2 08/09/2013 0817   CALCIUM 8.7 09/28/2011 0833   PROT 6.9 09/22/2013 0837   PROT 6.8 06/12/2013 0625   PROT 7.2 09/28/2011 0833   ALBUMIN 3.3* 09/22/2013 0837   ALBUMIN 3.1* 06/12/2013 0625   AST 9 09/22/2013 0837   AST 12 06/12/2013 0625   AST 15 09/28/2011 0833   ALT <6 09/22/2013 0837   ALT 7 06/12/2013 0625   ALT 16 09/28/2011 0833   ALKPHOS 147 09/22/2013 0837   ALKPHOS 114 06/12/2013 0625   ALKPHOS 99* 09/28/2011 0833   BILITOT 0.46 09/22/2013 0837   BILITOT 0.5 06/12/2013 0625   BILITOT 0.80 09/28/2011 0833   GFRNONAA 74* 06/12/2013 0625   GFRAA 86* 06/12/2013 0625    No results found for this basename: SPEP,  UPEP,   kappa and lambda light chains    Lab Results  Component Value Date   WBC 5.2 09/22/2013   NEUTROABS 3.8 09/22/2013   HGB 13.3 09/22/2013   HCT 42.0 09/22/2013   MCV 75.9* 09/22/2013   PLT 187 09/22/2013      Chemistry      Component Value Date/Time   NA 143 09/22/2013 0837   NA 139 08/09/2013 0817   NA 143 09/28/2011 0833   K 3.5 09/22/2013 0837   K 3.4* 08/09/2013 0817   K 3.5 09/28/2011 0833   CL 104 08/09/2013 0817   CL 102 10/24/2012 1001   CL 98 09/28/2011 0833   CO2 23 09/22/2013 0837   CO2 28 08/09/2013 0817   CO2 30 09/28/2011 0833   BUN 8.6 09/22/2013 0837   BUN 6 08/09/2013 0817   BUN 9 09/28/2011 0833   CREATININE 0.8 09/22/2013 0837   CREATININE 0.8 08/09/2013 0817   CREATININE 0.7 09/28/2011 0833      Component Value Date/Time   CALCIUM 9.4 09/22/2013 0837   CALCIUM 9.2 08/09/2013 0817   CALCIUM 8.7 09/28/2011 0833   ALKPHOS 147 09/22/2013 0837   ALKPHOS 114 06/12/2013 0625   ALKPHOS 99* 09/28/2011 0833   AST 9 09/22/2013 0837   AST 12 06/12/2013 0625   AST 15 09/28/2011 0833   ALT <6 09/22/2013 0837   ALT 7 06/12/2013 0625   ALT 16 09/28/2011 3300  BILITOT 0.46 09/22/2013 0837   BILITOT 0.5  06/12/2013 0625   BILITOT 0.80 09/28/2011 6286     RADIOGRAPHIC STUDIES: I reviewed all imaging study with her and her daughter. I have personally reviewed the radiological images as listed and agreed with the findings in the report.  ASSESSMENT & PLAN:  #1 stage IV lung cancer The patient had profound fatigue while on treatment. The patient is at peace with the idea to continuing chemotherapy holiday. Her most recent CT scan show no evidence of active disease. They are nonspecific lymphadenopathy the overall she feels fine. I recommend continue observation. I will see her back in 3 months with history, physical examination, blood work and chest x-ray. I will space of her CT imaging to every 6 months. #2 recurrent pleural effusion Pathology show no evidence of malignant cells.  She has undergone pleurocentesis and currently asymptomatic. #3 chronic fatigue This could be due to increased work of breathing from pleural effusion. It is actually better since we stopped treatment #4 cigarette smoking I spent a lot of time educating the patient importance of nicotine cessation. She's not willing to quit #5 history of brain metastasis She has no new symptoms. MRI of the head has been scheduled for July 2015 and she will continue followup with radiation oncologist for this. #6 insomnia I recommend she take Zanaflex at nighttime or over-the-counter Benadryl at nighttime. #7 venous access She has a port flush recently. I will continue port flush on a monthly basis. #8 hypertension and hyperlipidemia She will continue followup with her primary care provider to followup on these issues.  Orders Placed This Encounter  Procedures  . DG Chest 2 View    Standing Status: Future     Number of Occurrences:      Standing Expiration Date: 09/25/2014    Order Specific Question:  Reason for exam:    Answer:  lung ca, staging    Order Specific Question:  Preferred imaging location?    Answer:  [facility]   All questions were answered. The patient knows to call the clinic with any problems, questions or concerns. No barriers to learning was detected. I spent 30 minutes counseling the patient face to face. The total time spent in the appointment was 40 minutes and more than 50% was on counseling and review of test results     Heath Lark, MD 09/25/2013 12:30 PM

## 2013-09-28 ENCOUNTER — Encounter: Payer: Self-pay | Admitting: Family Medicine

## 2013-10-19 ENCOUNTER — Ambulatory Visit
Admission: RE | Admit: 2013-10-19 | Discharge: 2013-10-19 | Disposition: A | Discharge status: Home / Self Care | Payer: Medicare HMO | Source: Ambulatory Visit | Attending: Radiation Oncology | Admitting: Radiation Oncology

## 2013-10-24 ENCOUNTER — Encounter: Payer: Self-pay | Admitting: Radiation Therapy

## 2013-10-24 NOTE — Progress Notes (Signed)
[name]"  Was here for the 6/4 Brain Tumor Support Group Meeting

## 2013-11-07 ENCOUNTER — Other Ambulatory Visit: Payer: Self-pay | Admitting: Radiation Therapy

## 2013-11-16 ENCOUNTER — Ambulatory Visit
Admission: RE | Admit: 2013-11-16 | Discharge: 2013-11-16 | Disposition: A | Discharge status: Home / Self Care | Payer: Medicare HMO | Source: Ambulatory Visit | Attending: Radiation Oncology | Admitting: Radiation Oncology

## 2013-11-16 ENCOUNTER — Other Ambulatory Visit: Payer: Commercial Managed Care - HMO

## 2013-11-16 ENCOUNTER — Encounter: Payer: Self-pay | Admitting: Radiation Therapy

## 2013-11-16 ENCOUNTER — Ambulatory Visit (HOSPITAL_BASED_OUTPATIENT_CLINIC_OR_DEPARTMENT_OTHER): Payer: Commercial Managed Care - HMO

## 2013-11-16 ENCOUNTER — Ambulatory Visit
Admission: RE | Admit: 2013-11-16 | Discharge: 2013-11-16 | Disposition: A | Discharge status: Home / Self Care | Payer: Commercial Managed Care - HMO | Source: Ambulatory Visit | Attending: Radiation Oncology | Admitting: Radiation Oncology

## 2013-11-16 VITALS — BP 155/75 | HR 79

## 2013-11-16 DIAGNOSIS — Z452 Encounter for adjustment and management of vascular access device: Secondary | ICD-10-CM

## 2013-11-16 DIAGNOSIS — C7949 Secondary malignant neoplasm of other parts of nervous system: Principal | ICD-10-CM

## 2013-11-16 DIAGNOSIS — Z95828 Presence of other vascular implants and grafts: Secondary | ICD-10-CM

## 2013-11-16 DIAGNOSIS — C7931 Secondary malignant neoplasm of brain: Secondary | ICD-10-CM

## 2013-11-16 DIAGNOSIS — C3410 Malignant neoplasm of upper lobe, unspecified bronchus or lung: Secondary | ICD-10-CM

## 2013-11-16 LAB — BUN AND CREATININE (CC13)
BUN: 11.5 mg/dL (ref 7.0–26.0)
CREATININE: 0.8 mg/dL (ref 0.6–1.1)

## 2013-11-16 MED ORDER — HEPARIN SOD (PORK) LOCK FLUSH 100 UNIT/ML IV SOLN
500.0000 [IU] | Freq: Once | INTRAVENOUS | Status: AC
  Administered 2013-11-16: 500 [IU] via INTRAVENOUS
  Filled 2013-11-16: qty 5

## 2013-11-16 MED ORDER — GADOBENATE DIMEGLUMINE 529 MG/ML IV SOLN
14.0000 mL | Freq: Once | INTRAVENOUS | Status: AC | PRN
  Administered 2013-11-16: 14 mL via INTRAVENOUS

## 2013-11-16 MED ORDER — SODIUM CHLORIDE 0.9 % IJ SOLN
10.0000 mL | INTRAMUSCULAR | Status: DC | PRN
  Administered 2013-11-16: 10 mL via INTRAVENOUS
  Filled 2013-11-16: qty 10

## 2013-11-16 NOTE — Progress Notes (Signed)
Norris attended the 7/2 Brain Tumor Support Group

## 2013-11-20 ENCOUNTER — Other Ambulatory Visit: Payer: Self-pay | Admitting: Radiation Therapy

## 2013-11-20 ENCOUNTER — Encounter: Payer: Self-pay | Admitting: Radiation Oncology

## 2013-11-20 ENCOUNTER — Ambulatory Visit: Payer: Medicare HMO

## 2013-11-20 ENCOUNTER — Telehealth: Payer: Self-pay | Admitting: *Deleted

## 2013-11-20 ENCOUNTER — Ambulatory Visit
Admission: RE | Admit: 2013-11-20 | Discharge: 2013-11-20 | Disposition: A | Discharge status: Home / Self Care | Payer: Medicare HMO | Source: Ambulatory Visit | Attending: Radiation Oncology | Admitting: Radiation Oncology

## 2013-11-20 VITALS — BP 146/82 | HR 76 | Temp 98.0°F | Resp 18 | Wt 155.5 lb

## 2013-11-20 DIAGNOSIS — C7931 Secondary malignant neoplasm of brain: Secondary | ICD-10-CM

## 2013-11-20 DIAGNOSIS — C7949 Secondary malignant neoplasm of other parts of nervous system: Principal | ICD-10-CM

## 2013-11-20 NOTE — Telephone Encounter (Signed)
Per Manuela Schwartz in radiation I have scheduled appts

## 2013-11-20 NOTE — Progress Notes (Signed)
Radiation Oncology         [phone] ________________________________  Name: Lauren [name] MRN: [id number]  Date: 11/20/2013  DOB: 1950-10-10  Follow-Up Visit Note  Outpatient  CC: Tommi Rumps, MD  [name], MD  Diagnosis:   Diagnosis and Prior Radiotherapy: Metastatic non-small cell lung cancer, adenocarcinoma, with 2 brain metastases. Left occipital and right insular metastases  Interval Since Last Radiation: She completed 20 Gray in 1 fraction to both of the metastases on 03/01/2012    Narrative:  The patient returns today for routine follow-up.  She denies pain. Attending brain tumor support group and finds this beneficial. She  complains of Loss of Sleep.   Pt alert & oriented x 3 with fluent speech, pt reports becoming off balanced occasionally, and had a fall a few months ago, without injury.  Pt reports positive for double vision stating it is from her glasses, denies ringing sound in ears. Pt presenting appropriate quality, quantity and organization of sentences. Pt reports no headaches.  She report bilateral lower extremities, pt reports it is worse on the left side and that she occasionally has pain in her upper left back thigh and bilateral low back. Stable since decompressive surgery by Dr Luiz Ochoa (cannot find op note for date of surgery).  The patient eats a regular, healthy diet..  Still smoking and no desire to quit.    Pt reports she occasionally becomes SOB on exertion. Denies a cough     Most recent MRI of the brain on 11-16-13 demonstrates no evidence of new or relapsed disease.  Reviewed at tumor board today.    Status of systemic system per CT scan on 5-8 demonstrates that C/A/P have no obvious active disease..  Medical oncology's plans for the patient are for observation, at present.     ALLERGIES:  is allergic to adhesive and lisinopril.  Meds: Current Outpatient Prescriptions  Medication Sig Dispense Refill  . albuterol (PROVENTIL  HFA;VENTOLIN HFA) 108 (90 BASE) MCG/ACT inhaler Inhale 1 puff into the lungs 2 (two) times daily.  6.7 g  prn  . aspirin 81 MG chewable tablet Chew 81 mg by mouth daily.      . cloNIDine (CATAPRES) 0.1 MG tablet Take 1 tablet (0.1 mg total) by mouth 2 (two) times daily.  180 tablet  3  . diclofenac sodium (VOLTAREN) 1 % GEL Apply 2 g topically 4 (four) times daily. As needed for pain  100 g  11  . FLUoxetine (PROZAC) 20 MG tablet Take 1 tablet (20 mg total) by mouth daily.  90 tablet  3  . furosemide (LASIX) 20 MG tablet Take 1 tablet (20 mg total) by mouth daily.  90 tablet  3  . lidocaine-prilocaine (EMLA) cream Apply topically as needed. Apply to porta cath site one hour prior to needle stick.  30 g  prn  . magnesium oxide (MAG-OX) 400 (241.3 MG) MG tablet Take 1 tablet (400 mg total) by mouth daily.  30 tablet  1  . mirtazapine (REMERON) 15 MG tablet Take 1 tablet (15 mg total) by mouth at bedtime.  90 tablet  0  . nebivolol (BYSTOLIC) 10 MG tablet Take 1 tablet (10 mg total) by mouth daily.  90 tablet  4  . nitroGLYCERIN (NITROSTAT) 0.4 MG SL tablet Place 1 tablet (0.4 mg total) under the tongue every 5 (five) minutes as needed. For chest pain  10 tablet  0  . omeprazole (PRILOSEC) 40 MG capsule Take 1 capsule (40  mg total) by mouth daily.  90 capsule  3  . potassium chloride SA (K-DUR,KLOR-CON) 20 MEQ tablet Take 1 tablet (20 mEq total) by mouth 2 (two) times daily.  180 tablet  0  . pravastatin (PRAVACHOL) 40 MG tablet TAKE 1 TABLET BY MOUTH ONCE DAILY  90 tablet  3  . tiZANidine (ZANAFLEX) 2 MG tablet Take 1 tablet (2 mg total) by mouth every 6 (six) hours as needed (Back Muscle Spasm).  30 tablet  3  . traMADol (ULTRAM) 50 MG tablet Take 1 tablet (50 mg total) by mouth every 6 (six) hours as needed.  60 tablet  2  . [DISCONTINUED] buPROPion (WELLBUTRIN XL) 150 MG 24 hr tablet Take 1 tablet (150 mg total) by mouth 2 (two) times daily.  60 tablet  5   No current facility-administered  medications for this encounter.    Physical Findings: The patient is in no acute distress. Patient is alert and oriented.  weight is 155 lb 8 oz (70.534 kg). Her oral temperature is 98 F (36.7 C). Her blood pressure is 146/82 and her pulse is 76. Her respiration is 18 and oxygen saturation is 98%. .  No significant changes. General: Alert and oriented, in no acute distress HEENT: Head is normocephalic. Extraocular movements are intact. Oropharynx is clear. Upper dentures Neck: Neck is supple, no palpable cervical or supraclavicular lymphadenopathy. Heart: Regular in rate and rhythm with no murmurs, rubs, or gallops. Chest: decreased sounds at right base, no rhonchi wheezes or rales Extremities: mild ankle edema, L>R, no L calf cords or tenderness Lymphatics: No concerning lymphadenopathy. Skin: No concerning lesions. Musculoskeletal: symmetric strength and muscle tone throughout. Neurologic: Cranial nerves II through XII are grossly intact. No obvious focalities. Speech is fluent. Coordination is intact. Psychiatric: Judgment and insight are intact. Affect is appropriate. KPS 90   Lab Findings: Lab Results  Component Value Date   WBC 5.2 09/22/2013   HGB 13.3 09/22/2013   HCT 42.0 09/22/2013   MCV 75.9* 09/22/2013   PLT 187 09/22/2013       Radiographic Findings: Mr Jeri Cos ZO Contrast  11/16/2013   CLINICAL DATA:  62 year old female with metastatic lung cancer status post SRS treatment. Restaging. Subsequent encounter.  EXAM: MRI HEAD WITHOUT AND WITH CONTRAST  TECHNIQUE: Multiplanar, multiecho pulse sequences of the brain and surrounding structures were obtained without and with intravenous contrast.  CONTRAST:  57mL MULTIHANCE GADOBENATE DIMEGLUMINE 529 MG/ML IV SOLN  COMPARISON:  07/21/2013 and earlier.  FINDINGS: Chronic small left occipital metastasis on series 10, image 77 is unchanged.  No other enhancing brain metastasis is identified.  There is heterogeneous enhancement of the  pituitary gland, with a small hypoenhancing lesion at the posterior inferior aspect of the gland (series 12, image 19 and series 11 image 30. This appears stable compared to 05/27/2012 and presumably represents a benign pituitary lesion such is microadenoma.  No restricted diffusion to suggest acute infarction. No midline shift, mass effect, evidence of mass lesion, ventriculomegaly, extra-axial collection or acute intracranial hemorrhage. Cervicomedullary junction and pituitary are within normal limits. Major intracranial vascular flow voids are stable. New T2 hyperintense focus in the right paracentral pons, with facilitated diffusion and no enhancement, compatible with lacunar infarct. This is new since March. Elsewhere Stable gray and white matter signal. Negative visualized cervical spine. Visible bone marrow signal stable and within normal limits. Visualized orbit soft tissues are within normal limits. Paranasal sinuses and mastoids are stable. Visualized scalp soft tissues are within  normal limits.  IMPRESSION: 1. Continued stability of small treated left occipital lobe metastasis. No new brain metastasis identified. 2. 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 3. 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   Electronically Signed   By: Lars Pinks M.D.   On: 11/16/2013 13:23    Impression/Plan:  Doing well. Patient declines PT referral at this time for subacute fall without injury.  Pt continues to smoke, no desire to quit.  She has done remarkably well almost 2 yrs post [facility] for brain metastases. Rescan with MRI and f/u in 4 mo.   _________________   Eppie Gibson, MD

## 2013-11-20 NOTE — Progress Notes (Signed)
She is currently in no pain. Pt complains of, Loss of Sleep.  Pt alert & oriented x 3 with fluent speech, pt reports becoming off balanced occasionally, and had a fall a few months ago, without injury. Pt reports positive for double vision stating it is from her glasses, denies ringing sound in ears. Pt presenting appropriate quality, quantity and organization of sentences. Pt reports no headaches. Skin is warm and intact, noted edema over bilateral lower extremities, pt reports it is worse on the left side and that she occasionally has pain in her upper left back thigh. The patient eats a regular, healthy diet.. Pt reports she occasionally becomes SOB on exertion.  Denies a cough.

## 2013-12-01 ENCOUNTER — Encounter: Payer: Self-pay | Admitting: *Deleted

## 2013-12-01 NOTE — Progress Notes (Signed)
[place] Work  Clinical Social Work met with pt briefly to provide her with donated gift cards to assist her in buying new glasses. Pt received these through [place] [employer] and was very, very appreciative. She reports to be having some issues with short term memory and plans to bring this up to her doctor at her next appointment. She reports to be doing well otherwise and plans to attend the next Brain Tumor Support Group. She agrees to call CSW as needed.   Loren Racer, LCSW Clinical Social Worker [facility] [place] [facility] [place] Wednesday, Thursday and Friday Phone: [phone] Fax: [phone]

## 2013-12-21 ENCOUNTER — Ambulatory Visit: Payer: Medicare HMO | Attending: Radiation Oncology

## 2013-12-26 ENCOUNTER — Ambulatory Visit (HOSPITAL_BASED_OUTPATIENT_CLINIC_OR_DEPARTMENT_OTHER): Payer: Commercial Managed Care - HMO | Admitting: Hematology and Oncology

## 2013-12-26 ENCOUNTER — Ambulatory Visit (HOSPITAL_COMMUNITY)
Admission: RE | Admit: 2013-12-26 | Discharge: 2013-12-26 | Disposition: A | Discharge status: Home / Self Care | Payer: Medicare HMO | Source: Ambulatory Visit | Attending: Hematology and Oncology | Admitting: Hematology and Oncology

## 2013-12-26 ENCOUNTER — Telehealth: Payer: Self-pay | Admitting: Hematology and Oncology

## 2013-12-26 ENCOUNTER — Ambulatory Visit (HOSPITAL_BASED_OUTPATIENT_CLINIC_OR_DEPARTMENT_OTHER): Payer: Commercial Managed Care - HMO

## 2013-12-26 ENCOUNTER — Encounter: Payer: Self-pay | Admitting: Hematology and Oncology

## 2013-12-26 ENCOUNTER — Other Ambulatory Visit (HOSPITAL_BASED_OUTPATIENT_CLINIC_OR_DEPARTMENT_OTHER): Payer: Commercial Managed Care - HMO

## 2013-12-26 VITALS — BP 124/56 | HR 82 | Temp 98.3°F | Resp 18 | Ht 64.0 in | Wt 153.4 lb

## 2013-12-26 DIAGNOSIS — F17200 Nicotine dependence, unspecified, uncomplicated: Secondary | ICD-10-CM

## 2013-12-26 DIAGNOSIS — C3410 Malignant neoplasm of upper lobe, unspecified bronchus or lung: Secondary | ICD-10-CM

## 2013-12-26 DIAGNOSIS — C3490 Malignant neoplasm of unspecified part of unspecified bronchus or lung: Secondary | ICD-10-CM | POA: Insufficient documentation

## 2013-12-26 DIAGNOSIS — R0602 Shortness of breath: Secondary | ICD-10-CM | POA: Insufficient documentation

## 2013-12-26 DIAGNOSIS — C7931 Secondary malignant neoplasm of brain: Secondary | ICD-10-CM

## 2013-12-26 DIAGNOSIS — J90 Pleural effusion, not elsewhere classified: Secondary | ICD-10-CM | POA: Diagnosis not present

## 2013-12-26 DIAGNOSIS — C7949 Secondary malignant neoplasm of other parts of nervous system: Secondary | ICD-10-CM

## 2013-12-26 DIAGNOSIS — I959 Hypotension, unspecified: Secondary | ICD-10-CM | POA: Insufficient documentation

## 2013-12-26 DIAGNOSIS — C189 Malignant neoplasm of colon, unspecified: Secondary | ICD-10-CM

## 2013-12-26 DIAGNOSIS — Z95828 Presence of other vascular implants and grafts: Secondary | ICD-10-CM

## 2013-12-26 DIAGNOSIS — I952 Hypotension due to drugs: Secondary | ICD-10-CM

## 2013-12-26 DIAGNOSIS — Z85038 Personal history of other malignant neoplasm of large intestine: Secondary | ICD-10-CM

## 2013-12-26 LAB — CBC WITH DIFFERENTIAL/PLATELET
BASO%: 0.4 % (ref 0.0–2.0)
BASOS ABS: 0 10*3/uL (ref 0.0–0.1)
EOS%: 2.1 % (ref 0.0–7.0)
Eosinophils Absolute: 0.1 10*3/uL (ref 0.0–0.5)
HEMATOCRIT: 42 % (ref 34.8–46.6)
HEMOGLOBIN: 13.3 g/dL (ref 11.6–15.9)
LYMPH#: 0.8 10*3/uL — AB (ref 0.9–3.3)
LYMPH%: 13.5 % — AB (ref 14.0–49.7)
MCH: 23.7 pg — AB (ref 25.1–34.0)
MCHC: 31.6 g/dL (ref 31.5–36.0)
MCV: 75.1 fL — ABNORMAL LOW (ref 79.5–101.0)
MONO#: 0.5 10*3/uL (ref 0.1–0.9)
MONO%: 8.6 % (ref 0.0–14.0)
NEUT#: 4.5 10*3/uL (ref 1.5–6.5)
NEUT%: 75.4 % (ref 38.4–76.8)
PLATELETS: 204 10*3/uL (ref 145–400)
RBC: 5.59 10*6/uL — ABNORMAL HIGH (ref 3.70–5.45)
RDW: 17.2 % — ABNORMAL HIGH (ref 11.2–14.5)
WBC: 5.9 10*3/uL (ref 3.9–10.3)

## 2013-12-26 LAB — COMPREHENSIVE METABOLIC PANEL
AST: 12 U/L (ref 0–37)
Albumin: 3.8 g/dL (ref 3.5–5.2)
Alkaline Phosphatase: 148 U/L — ABNORMAL HIGH (ref 39–117)
BILIRUBIN TOTAL: 0.6 mg/dL (ref 0.2–1.2)
BUN: 9 mg/dL (ref 6–23)
CO2: 27 mEq/L (ref 19–32)
Calcium: 8.8 mg/dL (ref 8.4–10.5)
Chloride: 106 mEq/L (ref 96–112)
Creatinine, Ser: 0.83 mg/dL (ref 0.50–1.10)
Glucose, Bld: 101 mg/dL — ABNORMAL HIGH (ref 70–99)
Potassium: 4 mEq/L (ref 3.5–5.3)
Sodium: 141 mEq/L (ref 135–145)
Total Protein: 6.8 g/dL (ref 6.0–8.3)

## 2013-12-26 MED ORDER — SODIUM CHLORIDE 0.9 % IJ SOLN
10.0000 mL | INTRAMUSCULAR | Status: DC | PRN
  Administered 2013-12-26: 10 mL via INTRAVENOUS
  Filled 2013-12-26: qty 10

## 2013-12-26 MED ORDER — HEPARIN SOD (PORK) LOCK FLUSH 100 UNIT/ML IV SOLN
500.0000 [IU] | Freq: Once | INTRAVENOUS | Status: AC
  Administered 2013-12-26: 500 [IU] via INTRAVENOUS
  Filled 2013-12-26: qty 5

## 2013-12-26 NOTE — Assessment & Plan Note (Signed)
I spent some time counseling the patient the importance of tobacco cessation. she is currently attempting to quit on her own 

## 2013-12-26 NOTE — Telephone Encounter (Signed)
Pt confirmed labs/ov per 08/11 POF, gave pt AVS....KJ

## 2013-12-26 NOTE — Assessment & Plan Note (Signed)
She had pleuritic disease. Clinical examination showed reduced breast on the right lung base. I will order a chest x-ray today. She is not symptomatic.

## 2013-12-26 NOTE — Assessment & Plan Note (Signed)
She has no signs of recurrence.

## 2013-12-26 NOTE — Patient Instructions (Signed)

## 2013-12-26 NOTE — Assessment & Plan Note (Signed)
MRI is scheduled for November. She will continue routine imaging surveillance. Clinically, she has no new neurological deficit.

## 2013-12-26 NOTE — Assessment & Plan Note (Signed)
I recommended she discontinue clonidine.

## 2013-12-26 NOTE — Assessment & Plan Note (Signed)
Clinically, she has no signs of progression. I will order a chest x-ray today. Plan to see her back in 3 months with history, physical examination, blood work, CT scan of the chest, abdomen and pelvis for restaging. The patient is advised to quit smoking.

## 2013-12-26 NOTE — Progress Notes (Signed)
[facility] OFFICE PROGRESS NOTE  Patient Care Team: Leone Haven, MD as PCP - General (Family Medicine) Gaye Pollack, MD (Cardiothoracic Surgery) Thea Silversmith, MD (Radiation Oncology) Dickie La, MD (Family Medicine) Heath Lark, MD as Consulting Physician (Hematology and Oncology)  SUMMARY OF ONCOLOGIC HISTORY: Oncology History   Colon cancer   Primary site: Colon and Rectum (Left)   Staging method: AJCC 7th Edition   Clinical: Stage I (T2, N0, M0) signed by Heath Lark, MD on 05/31/2013  2:39 PM   Pathologic: Stage I (T2, N0, cM0) signed by Heath Lark, MD on 05/31/2013  2:39 PM   Summary: Stage I (T2, N0, cM0) Lung cancer, EGFR/ALK negative, recurrence after initial resection to LN and brain   Primary site: Lung (Left)   Staging method: AJCC 7th Edition   Clinical: Stage IV (T1, N2, M1b) signed by Heath Lark, MD on 05/31/2013  2:26 PM   Pathologic: Stage IV (T1, N2, M1b) signed by Heath Lark, MD on 05/31/2013  2:26 PM   Summary: Stage IV (T1, N2, M1b)       Lung cancer   03/01/2007 Procedure Colonoscopy revealed abnormalities and biopsy show high-grade dysplasia   04/01/2007 Surgery She underwent sigmoid resection which showed T2 N0 colon cancer, and negative margins and all of 17 lymph nodes were negative   02/29/2008 Procedure Repeat surveillance colonoscopy was negative.   06/16/2010 Surgery She underwent left upper lobectomy we show well-differentiated adenocarcinoma of the lung, T1, N0, M0   03/18/2011 Procedure Repeat colonoscopy show multiple polyps but there were benign   10/19/2011 Procedure Biopsy of mediastinal lymph node came back positive for recurrence of lung cancer, EGFR and ALK negative   11/16/2011 - 12/14/2011 Chemotherapy She received concurrent chemoradiation therapy with weekly carboplatin and Taxol.   11/16/2011 - 12/29/2011 Radiation Therapy She received radiation therapy with weekly chemotherapy   02/15/2012 Imaging MR of the brain showed a  new intracranial metastases. This was subsequently treated with stereotactic radiosurgery.   03/07/2012 - 04/12/2013 Chemotherapy She received chemotherapy with maintainence Alimta every 3 weeks. Chemotherapy was discontinued due to profound fatigue   03/02/2013 Procedure She had therapeutic ultrasound guidance thoracentesis for pleural effusion that came back negative for cancer   05/04/2013 Procedure She had repeat ultrasound-guided thoracentesis again and cytology was negative   05/29/2013 Imaging Repeat CT scan of the chest, abdomen and pelvis show no evidence of disease but persistent right-sided pleural effusion   06/02/2013 Surgery The patient had placement of Pleurx catheter and subsequently underwent pleurodesis.   09/22/2013 Imaging Repeat CT scan show no evidence of active disease. There are nonspecific lymphadenopathy and she is placed on observation.    Brain metastases treated with radiosurgery   02/26/2012 Initial Diagnosis Brain metastases treated with radiosurgery    INTERVAL HISTORY: Please see below for problem oriented charting. She continues to have fatigue on minimal exertion. Denies any chest pain or shortness of breath. She denies new neurological deficit. No headaches.  REVIEW OF SYSTEMS:   Constitutional: Denies fevers, chills or abnormal weight loss Eyes: Denies blurriness of vision Ears, nose, mouth, throat, and face: Denies mucositis or sore throat Respiratory: Denies cough, dyspnea or wheezes Cardiovascular: Denies palpitation, chest discomfort with chronic bilateral lower extremity swelling Gastrointestinal:  Denies nausea, heartburn or change in bowel habits Skin: Denies abnormal skin rashes Lymphatics: Denies new lymphadenopathy or easy bruising Neurological:Denies numbness, tingling or new weaknesses Behavioral/Psych: Mood is stable, no new changes  All other systems were  reviewed with the patient and are negative.  I have reviewed the past medical  history, past surgical history, social history and family history with the patient and they are unchanged from previous note.  ALLERGIES:  is allergic to adhesive and lisinopril.  MEDICATIONS:  Current Outpatient Prescriptions  Medication Sig Dispense Refill  . albuterol (PROVENTIL HFA;VENTOLIN HFA) 108 (90 BASE) MCG/ACT inhaler Inhale 1 puff into the lungs 2 (two) times daily.  6.7 g  prn  . aspirin 81 MG chewable tablet Chew 81 mg by mouth daily.      . cloNIDine (CATAPRES) 0.1 MG tablet Take 1 tablet (0.1 mg total) by mouth 2 (two) times daily.  180 tablet  3  . diclofenac sodium (VOLTAREN) 1 % GEL Apply 2 g topically 4 (four) times daily. As needed for pain  100 g  11  . FLUoxetine (PROZAC) 20 MG tablet Take 1 tablet (20 mg total) by mouth daily.  90 tablet  3  . furosemide (LASIX) 20 MG tablet Take 1 tablet (20 mg total) by mouth daily.  90 tablet  3  . mirtazapine (REMERON) 15 MG tablet Take 1 tablet (15 mg total) by mouth at bedtime.  90 tablet  0  . nebivolol (BYSTOLIC) 10 MG tablet Take 1 tablet (10 mg total) by mouth daily.  90 tablet  4  . nitroGLYCERIN (NITROSTAT) 0.4 MG SL tablet Place 1 tablet (0.4 mg total) under the tongue every 5 (five) minutes as needed. For chest pain  10 tablet  0  . omeprazole (PRILOSEC) 40 MG capsule Take 1 capsule (40 mg total) by mouth daily.  90 capsule  3  . potassium chloride SA (K-DUR,KLOR-CON) 20 MEQ tablet Take 1 tablet (20 mEq total) by mouth 2 (two) times daily.  180 tablet  0  . pravastatin (PRAVACHOL) 40 MG tablet TAKE 1 TABLET BY MOUTH ONCE DAILY  90 tablet  3  . tiZANidine (ZANAFLEX) 2 MG tablet Take 1 tablet (2 mg total) by mouth every 6 (six) hours as needed (Back Muscle Spasm).  30 tablet  3  . traMADol (ULTRAM) 50 MG tablet Take 1 tablet (50 mg total) by mouth every 6 (six) hours as needed.  60 tablet  2  . lidocaine-prilocaine (EMLA) cream Apply topically as needed. Apply to porta cath site one hour prior to needle stick.  30 g  prn  .  [DISCONTINUED] buPROPion (WELLBUTRIN XL) 150 MG 24 hr tablet Take 1 tablet (150 mg total) by mouth 2 (two) times daily.  60 tablet  5   No current facility-administered medications for this visit.    PHYSICAL EXAMINATION: ECOG PERFORMANCE STATUS: 1 - Symptomatic but completely ambulatory  Filed Vitals:   12/26/13 1000  BP: 124/56  Pulse: 82  Temp: 98.3 F (36.8 C)  Resp: 18   Filed Weights   12/26/13 1000  Weight: 153 lb 6.4 oz (69.582 kg)    GENERAL:alert, no distress and comfortable SKIN: skin color, texture, turgor are normal, no rashes or significant lesions EYES: normal, Conjunctiva are pink and non-injected, sclera clear OROPHARYNX:no exudate, no erythema and lips, buccal mucosa, and tongue normal  NECK: supple, thyroid normal size, non-tender, without nodularity LYMPH:  no palpable lymphadenopathy in the cervical, axillary or inguinal LUNGS: clear to auscultation except reduced breath sounds on the right lung base with dullness on percussion. HEART: regular rate & rhythm and no murmurs with mild bilateral lower extremity edema ABDOMEN:abdomen soft, non-tender and normal bowel sounds Musculoskeletal:no cyanosis of digits  and no clubbing  NEURO: alert & oriented x 3 with fluent speech, no focal motor/sensory deficits  LABORATORY DATA:  I have reviewed the data as listed    Component Value Date/Time   NA 143 09/22/2013 0837   NA 139 08/09/2013 0817   NA 143 09/28/2011 0833   K 3.5 09/22/2013 0837   K 3.4* 08/09/2013 0817   K 3.5 09/28/2011 0833   CL 104 08/09/2013 0817   CL 102 10/24/2012 1001   CL 98 09/28/2011 0833   CO2 23 09/22/2013 0837   CO2 28 08/09/2013 0817   CO2 30 09/28/2011 0833   GLUCOSE 111 09/22/2013 0837   GLUCOSE 97 08/09/2013 0817   GLUCOSE 111* 10/24/2012 1001   GLUCOSE 112 09/28/2011 0833   BUN 11.5 11/16/2013 0915   BUN 6 08/09/2013 0817   BUN 9 09/28/2011 0833   CREATININE 0.8 11/16/2013 0915   CREATININE 0.8 08/09/2013 0817   CREATININE 0.7 09/28/2011 0833    CALCIUM 9.4 09/22/2013 0837   CALCIUM 9.2 08/09/2013 0817   CALCIUM 8.7 09/28/2011 0833   PROT 6.9 09/22/2013 0837   PROT 6.8 06/12/2013 0625   PROT 7.2 09/28/2011 0833   ALBUMIN 3.3* 09/22/2013 0837   ALBUMIN 3.1* 06/12/2013 0625   AST 9 09/22/2013 0837   AST 12 06/12/2013 0625   AST 15 09/28/2011 0833   ALT <6 09/22/2013 0837   ALT 7 06/12/2013 0625   ALT 16 09/28/2011 0833   ALKPHOS 147 09/22/2013 0837   ALKPHOS 114 06/12/2013 0625   ALKPHOS 99* 09/28/2011 0833   BILITOT 0.46 09/22/2013 0837   BILITOT 0.5 06/12/2013 0625   BILITOT 0.80 09/28/2011 0833   GFRNONAA 74* 06/12/2013 0625   GFRAA 86* 06/12/2013 0625    No results found for this basename: SPEP, UPEP,  kappa and lambda light chains    Lab Results  Component Value Date   WBC 5.9 12/26/2013   NEUTROABS 4.5 12/26/2013   HGB 13.3 12/26/2013   HCT 42.0 12/26/2013   MCV 75.1* 12/26/2013   PLT 204 12/26/2013      Chemistry      Component Value Date/Time   NA 143 09/22/2013 0837   NA 139 08/09/2013 0817   NA 143 09/28/2011 0833   K 3.5 09/22/2013 0837   K 3.4* 08/09/2013 0817   K 3.5 09/28/2011 0833   CL 104 08/09/2013 0817   CL 102 10/24/2012 1001   CL 98 09/28/2011 0833   CO2 23 09/22/2013 0837   CO2 28 08/09/2013 0817   CO2 30 09/28/2011 0833   BUN 11.5 11/16/2013 0915   BUN 6 08/09/2013 0817   BUN 9 09/28/2011 0833   CREATININE 0.8 11/16/2013 0915   CREATININE 0.8 08/09/2013 0817   CREATININE 0.7 09/28/2011 0833      Component Value Date/Time   CALCIUM 9.4 09/22/2013 0837   CALCIUM 9.2 08/09/2013 0817   CALCIUM 8.7 09/28/2011 0833   ALKPHOS 147 09/22/2013 0837   ALKPHOS 114 06/12/2013 0625   ALKPHOS 99* 09/28/2011 0833   AST 9 09/22/2013 0837   AST 12 06/12/2013 0625   AST 15 09/28/2011 0833   ALT <6 09/22/2013 0837   ALT 7 06/12/2013 0625   ALT 16 09/28/2011 0833   BILITOT 0.46 09/22/2013 0837   BILITOT 0.5 06/12/2013 0625   BILITOT 0.80 09/28/2011 0833     ASSESSMENT & PLAN:  Lung cancer Clinically, she has no signs of progression. I will order a chest  x-ray today.  Plan to see her back in 3 months with history, physical examination, blood work, CT scan of the chest, abdomen and pelvis for restaging. The patient is advised to quit smoking.  Colon cancer She has no signs of recurrence.  Brain metastases treated with radiosurgery MRI is scheduled for November. She will continue routine imaging surveillance. Clinically, she has no new neurological deficit.  TOBACCO ABUSE I spent some time counseling the patient the importance of tobacco cessation. she is currently attempting to quit on her own  Pleural effusion She had pleuritic disease. Clinical examination showed reduced breast on the right lung base. I will order a chest x-ray today. She is not symptomatic.  Hypotension I recommended she discontinue clonidine.   Orders Placed This Encounter  Procedures  . DG Chest 2 View    Standing Status: Future     Number of Occurrences:      Standing Expiration Date: 01/30/2015    Order Specific Question:  Reason for exam:    Answer:  lung ca    Order Specific Question:  Preferred imaging location?    Answer:  [facility]  . CT Chest W Contrast    Standing Status: Future     Number of Occurrences:      Standing Expiration Date: 02/25/2015    Order Specific Question:  Reason for Exam (SYMPTOM  OR DIAGNOSIS REQUIRED)    Answer:  staging lung ca    Order Specific Question:  Preferred imaging location?    Answer:  [facility]  . CT Abdomen Pelvis W Contrast    Standing Status: Future     Number of Occurrences:      Standing Expiration Date: 03/28/2015    Order Specific Question:  Reason for Exam (SYMPTOM  OR DIAGNOSIS REQUIRED)    Answer:  staging lung ca    Order Specific Question:  Preferred imaging location?    Answer:  [facility]   All questions were answered. The patient knows to call the clinic with any problems, questions or concerns. No barriers to learning was detected. I spent 25 minutes  counseling the patient face to face. The total time spent in the appointment was 30 minutes and more than 50% was on counseling and review of test results     [facility], [place], MD 12/26/2013 10:25 AM

## 2014-01-04 ENCOUNTER — Encounter: Payer: Self-pay | Admitting: Gastroenterology

## 2014-01-16 ENCOUNTER — Encounter: Payer: Self-pay | Admitting: Gastroenterology

## 2014-01-18 ENCOUNTER — Ambulatory Visit: Payer: Medicare HMO | Attending: Family Medicine

## 2014-02-05 ENCOUNTER — Telehealth: Payer: Self-pay | Admitting: *Deleted

## 2014-02-05 NOTE — Telephone Encounter (Signed)
Pt left a message requesting flu and shingles shots when she comes in for her port flush on 02/13/14

## 2014-02-13 ENCOUNTER — Telehealth: Payer: Self-pay | Admitting: *Deleted

## 2014-02-13 ENCOUNTER — Ambulatory Visit (HOSPITAL_BASED_OUTPATIENT_CLINIC_OR_DEPARTMENT_OTHER): Payer: Commercial Managed Care - HMO

## 2014-02-13 VITALS — BP 141/68 | HR 75 | Temp 98.0°F | Resp 16

## 2014-02-13 DIAGNOSIS — Z452 Encounter for adjustment and management of vascular access device: Secondary | ICD-10-CM

## 2014-02-13 DIAGNOSIS — C3410 Malignant neoplasm of upper lobe, unspecified bronchus or lung: Secondary | ICD-10-CM

## 2014-02-13 MED ORDER — SODIUM CHLORIDE 0.9 % IJ SOLN
10.0000 mL | INTRAMUSCULAR | Status: DC | PRN
  Administered 2014-02-13: 10 mL via INTRAVENOUS
  Filled 2014-02-13: qty 10

## 2014-02-13 MED ORDER — HEPARIN SOD (PORK) LOCK FLUSH 100 UNIT/ML IV SOLN
500.0000 [IU] | Freq: Once | INTRAVENOUS | Status: AC
  Administered 2014-02-13: 500 [IU] via INTRAVENOUS
  Filled 2014-02-13: qty 5

## 2014-02-13 NOTE — Telephone Encounter (Signed)
Pt coming in this Friday for flu shot, wants to know if she can get an rx for the shingles vaccine to pick up Friday after her appt.

## 2014-02-13 NOTE — Patient Instructions (Signed)

## 2014-02-15 ENCOUNTER — Ambulatory Visit: Payer: Medicare HMO | Attending: Family Medicine

## 2014-02-16 ENCOUNTER — Ambulatory Visit (INDEPENDENT_AMBULATORY_CARE_PROVIDER_SITE_OTHER): Payer: Commercial Managed Care - HMO | Admitting: *Deleted

## 2014-02-16 DIAGNOSIS — Z23 Encounter for immunization: Secondary | ICD-10-CM

## 2014-02-16 MED ORDER — ZOSTER VACCINE LIVE 19400 UNT/0.65ML ~~LOC~~ SOLR: 0.6500 mL | Freq: Once | SUBCUTANEOUS | Status: DC

## 2014-02-16 NOTE — Telephone Encounter (Signed)
Patient in nurse office for flu shot and requesting zostavax prescription. She is no longer on chemotherapy or radiation therapy for her various cancers. She reports she is not on any immunomodulatory therapy at this time. Prescription for zostavax given.

## 2014-03-08 ENCOUNTER — Other Ambulatory Visit: Payer: Self-pay | Admitting: *Deleted

## 2014-03-10 MED ORDER — MIRTAZAPINE 15 MG PO TABS: 15.0000 mg | ORAL_TABLET | Freq: Every day | ORAL | Status: DC

## 2014-03-12 ENCOUNTER — Other Ambulatory Visit: Payer: Self-pay | Admitting: *Deleted

## 2014-03-12 MED ORDER — MIRTAZAPINE 15 MG PO TABS: 15.0000 mg | ORAL_TABLET | Freq: Every day | ORAL | Status: DC

## 2014-03-13 ENCOUNTER — Telehealth: Payer: Self-pay | Admitting: Obstetrics and Gynecology

## 2014-03-13 NOTE — Telephone Encounter (Signed)
Pt called and would like to speak to [place]. She said that they have been playing phone tag. Jw

## 2014-03-14 NOTE — Telephone Encounter (Signed)
Patient had completed PCP change request form to change to female PCP.  Changed PCP to Dr. Gerarda Fraction.   Burna Forts, BSN, RN-BC

## 2014-03-19 ENCOUNTER — Encounter: Payer: Self-pay | Admitting: Gastroenterology

## 2014-03-19 ENCOUNTER — Ambulatory Visit (INDEPENDENT_AMBULATORY_CARE_PROVIDER_SITE_OTHER): Payer: Commercial Managed Care - HMO | Admitting: Gastroenterology

## 2014-03-19 VITALS — BP 148/82 | HR 72 | Ht 64.0 in | Wt 164.6 lb

## 2014-03-19 DIAGNOSIS — C189 Malignant neoplasm of colon, unspecified: Secondary | ICD-10-CM

## 2014-03-19 NOTE — Patient Instructions (Signed)
Dr. Ardis Hughs will review your restaging CT scans (later this week).  Will also communicate with Dr. Alvy Bimler about whether colon cancer screening is still relevant for you. Stay hydrated with extra water every day, also try to eat a fiber cereal every day.

## 2014-03-19 NOTE — Progress Notes (Signed)
Review of pertinent gastrointestinal problems: 1. Stage I (T2N0M0) left sided colon cancer, diagnosed Dr. Ardis Hughs 2008 colonoscopy; treated with resection 2008; Repeat colonoscopy Dr. Ardis Hughs 2009 no polyp, anastomosis was normal. Repeat colonoscopy 2012 Dr. Ardis Hughs found 5 subCM tubular adenomas, all removed, recall recommended 3 years. 2. Metastatic lung cancer (brain)   HPI: This is a   Very pleasant 63 year old woman whom I last saw about 3 years ago, surveillance colonoscopy.  She continues to have issues with intermittent constipation. She is on pain medicines likely contribute. She sees no blood in her stool  Lung cancer left and right, metastatic to brain (s/p XRT).  Last chemo 1 year ago.   She is due for restaging CT scans later this week.  Past Medical History  Diagnosis Date  . Depression   . Hyperlipidemia   . Hypertension   . Lung nodule     FNA ordered for 04/02/10 by HA>pos Ca  . Chronic folliculitis     of groin  . Fibrocystic breast changes   . Family history of trichomonal vaginitis 05/2005  . Postmenopausal   . Depressive disorder   . Hypercholesterolemia   . GERD (gastroesophageal reflux disease)   . Cerebral aneurysm   . Back pain   . Shortness of breath   . COPD (chronic obstructive pulmonary disease)   . Coronary artery disease   . Arthritis   . Weight loss 10/22/2011  . Status post radiation therapy 11/16/11 - 12/29/11    Right Lung and Mediastinum: 60 Gy  . Status post chemotherapy comp. 12/29/11    Carboplatin/Taxol  . S/P radiation therapy 03/01/12    SRS: 1 fraction / 20 Gray each to the Left Occipital Region and to the Right Insular Metastases  . On antineoplastic chemotherapy started 02/2012    Alimta  . Fatigue 02/06/2013  . Pleural effusion 05/03/2013  . Colon cancer 11/08  . Lung cancer 06/16/10    PET scan 04/28/2010; primary: increase in size 02/2010 / Well Differentiated Adenocarcinoma of the lung   . Brain metastases 02/15/12    Past  Surgical History  Procedure Laterality Date  . Colectomy  03/22/07    Stage 1 pT2 N0, M0 Adenocarcinoma of the sigmoid  colon  . Tubal ligation    . Lung lobectomy  06/16/10    Left Upper Lobectomy  . Hernia repair    . Breast surgery      Bil lumpectomy  . Back surgery    . Mediastinoscopy  10/19/2011    Procedure: MEDIASTINOSCOPY;  Surgeon: Gaye Pollack, MD;  Location: [facility] OR;  Service: Thoracic;  Laterality: N/A;  . Cardiac cath x3    . Chest tube insertion Right 06/12/2013    Procedure: INSERTION PLEURAL DRAINAGE CATHETER;  Surgeon: Gaye Pollack, MD;  Location: [place];  Service: Thoracic;  Laterality: Right;  . Talc pleurodesis Right 06/12/2013    Procedure: Pietro Cassis;  Surgeon: Gaye Pollack, MD;  Location: [facility] OR;  Service: Thoracic;  Laterality: Right;    Current Outpatient Prescriptions  Medication Sig Dispense Refill  . albuterol (PROVENTIL HFA;VENTOLIN HFA) 108 (90 BASE) MCG/ACT inhaler Inhale 1 puff into the lungs 2 (two) times daily. 6.7 g prn  . aspirin 81 MG chewable tablet Chew 81 mg by mouth daily.    . diclofenac sodium (VOLTAREN) 1 % GEL Apply 2 g topically 4 (four) times daily. As needed for pain 100 g 11  . FLUoxetine (PROZAC) 20 MG tablet Take 1 tablet (  20 mg total) by mouth daily. 90 tablet 3  . furosemide (LASIX) 20 MG tablet Take 1 tablet (20 mg total) by mouth daily. 90 tablet 3  . lidocaine-prilocaine (EMLA) cream Apply topically as needed. Apply to porta cath site one hour prior to needle stick. 30 g prn  . mirtazapine (REMERON) 15 MG tablet Take 1 tablet (15 mg total) by mouth at bedtime. 90 tablet 0  . nebivolol (BYSTOLIC) 10 MG tablet Take 1 tablet (10 mg total) by mouth daily. 90 tablet 4  . nitroGLYCERIN (NITROSTAT) 0.4 MG SL tablet Place 1 tablet (0.4 mg total) under the tongue every 5 (five) minutes as needed. For chest pain 10 tablet 0  . omeprazole (PRILOSEC) 40 MG capsule Take 1 capsule (40 mg total) by mouth daily. 90 capsule 3  . potassium  chloride SA (K-DUR,KLOR-CON) 20 MEQ tablet Take 1 tablet (20 mEq total) by mouth 2 (two) times daily. 180 tablet 0  . pravastatin (PRAVACHOL) 40 MG tablet TAKE 1 TABLET BY MOUTH ONCE DAILY 90 tablet 3  . tiZANidine (ZANAFLEX) 2 MG tablet Take 1 tablet (2 mg total) by mouth every 6 (six) hours as needed (Back Muscle Spasm). 30 tablet 3  . traMADol (ULTRAM) 50 MG tablet Take 1 tablet (50 mg total) by mouth every 6 (six) hours as needed. 60 tablet 2  . [DISCONTINUED] buPROPion (WELLBUTRIN XL) 150 MG 24 hr tablet Take 1 tablet (150 mg total) by mouth 2 (two) times daily. 60 tablet 5   No current facility-administered medications for this visit.    Allergies as of 03/19/2014 - Review Complete 03/19/2014  Allergen Reaction Noted  . Adhesive [tape] Rash 06/08/2013  . Lisinopril Cough 12/29/2006    Family History  Problem Relation Age of Onset  . Stomach cancer Maternal Aunt   . Breast cancer Cousin   . Cancer Sister     Lymphatic  . Osteoarthritis Father   . Gout Father   . Hypertension Father   . Heart disease Mother     pericarditis;   . Anesthesia problems Neg Hx     History   Social History  . Marital Status: Widowed    Spouse Name: N/A    Number of Children: 3  . 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 Smoking status: Current Every Day Smoker -- 1.00 packs/day    Types: Cigarettes  . Smokeless tobacco: Never Used  . Alcohol Use: 0.0 oz/week    0 Not specified per week     Comment: occasional  . Drug Use: No  . Sexual Activity: Not on file   Other Topics Concern  . Not on file   Social History Narrative      Physical Exam: BP 148/82 mmHg  Pulse 72  Ht 5\' 4"  (1.626 m)  Wt 164 lb 9.6 oz (74.662 kg)  BMI 28.24 kg/m2 Constitutional: generally well-appearing Psychiatric: alert and oriented x3 Abdomen: soft, nontender, nondistended, no obvious ascites, no peritoneal signs, normal bowel  sounds     Assessment and plan: 63 y.o. female with history of colon cancer, history of lung cancer  She is having restaging lung and brain CTs later this week. I will review those and will communicate with her oncologist about R colon cancer surveillance, screening examinations are still a relevant question for her after those scan results are back.

## 2014-03-22 ENCOUNTER — Ambulatory Visit: Payer: Medicare HMO | Attending: Obstetrics and Gynecology

## 2014-03-23 ENCOUNTER — Other Ambulatory Visit (HOSPITAL_BASED_OUTPATIENT_CLINIC_OR_DEPARTMENT_OTHER): Payer: Commercial Managed Care - HMO

## 2014-03-23 ENCOUNTER — Encounter (HOSPITAL_COMMUNITY): Payer: Self-pay

## 2014-03-23 ENCOUNTER — Ambulatory Visit (HOSPITAL_COMMUNITY)
Admission: RE | Admit: 2014-03-23 | Discharge: 2014-03-23 | Disposition: A | Discharge status: Home / Self Care | Payer: Medicare HMO | Source: Ambulatory Visit | Attending: Hematology and Oncology | Admitting: Hematology and Oncology

## 2014-03-23 ENCOUNTER — Ambulatory Visit (HOSPITAL_BASED_OUTPATIENT_CLINIC_OR_DEPARTMENT_OTHER): Payer: Commercial Managed Care - HMO

## 2014-03-23 ENCOUNTER — Ambulatory Visit
Admission: RE | Admit: 2014-03-23 | Discharge: 2014-03-23 | Disposition: A | Discharge status: Home / Self Care | Payer: Commercial Managed Care - HMO | Source: Ambulatory Visit | Attending: Radiation Oncology | Admitting: Radiation Oncology

## 2014-03-23 ENCOUNTER — Telehealth: Payer: Self-pay | Admitting: *Deleted

## 2014-03-23 VITALS — BP 131/73 | HR 77 | Temp 99.0°F | Resp 20

## 2014-03-23 DIAGNOSIS — I2510 Atherosclerotic heart disease of native coronary artery without angina pectoris: Secondary | ICD-10-CM | POA: Insufficient documentation

## 2014-03-23 DIAGNOSIS — R918 Other nonspecific abnormal finding of lung field: Secondary | ICD-10-CM | POA: Insufficient documentation

## 2014-03-23 DIAGNOSIS — C7931 Secondary malignant neoplasm of brain: Secondary | ICD-10-CM

## 2014-03-23 DIAGNOSIS — Z452 Encounter for adjustment and management of vascular access device: Secondary | ICD-10-CM

## 2014-03-23 DIAGNOSIS — I700 Atherosclerosis of aorta: Secondary | ICD-10-CM | POA: Diagnosis not present

## 2014-03-23 DIAGNOSIS — C7949 Secondary malignant neoplasm of other parts of nervous system: Principal | ICD-10-CM

## 2014-03-23 DIAGNOSIS — C3490 Malignant neoplasm of unspecified part of unspecified bronchus or lung: Secondary | ICD-10-CM | POA: Diagnosis present

## 2014-03-23 DIAGNOSIS — C3410 Malignant neoplasm of upper lobe, unspecified bronchus or lung: Secondary | ICD-10-CM

## 2014-03-23 DIAGNOSIS — C189 Malignant neoplasm of colon, unspecified: Secondary | ICD-10-CM

## 2014-03-23 DIAGNOSIS — Z85038 Personal history of other malignant neoplasm of large intestine: Secondary | ICD-10-CM | POA: Diagnosis not present

## 2014-03-23 DIAGNOSIS — Z85118 Personal history of other malignant neoplasm of bronchus and lung: Secondary | ICD-10-CM | POA: Insufficient documentation

## 2014-03-23 DIAGNOSIS — Z90710 Acquired absence of both cervix and uterus: Secondary | ICD-10-CM | POA: Diagnosis not present

## 2014-03-23 LAB — BUN AND CREATININE (CC13)
BUN: 12.6 mg/dL (ref 7.0–26.0)
Creatinine: 0.8 mg/dL (ref 0.6–1.1)

## 2014-03-23 MED ORDER — IOHEXOL 300 MG/ML  SOLN
100.0000 mL | Freq: Once | INTRAMUSCULAR | Status: AC | PRN
  Administered 2014-03-23: 100 mL via INTRAVENOUS

## 2014-03-23 MED ORDER — GADOBENATE DIMEGLUMINE 529 MG/ML IV SOLN
15.0000 mL | Freq: Once | INTRAVENOUS | Status: AC | PRN
  Administered 2014-03-23: 15 mL via INTRAVENOUS

## 2014-03-23 MED ORDER — SODIUM CHLORIDE 0.9 % IJ SOLN
10.0000 mL | INTRAMUSCULAR | Status: DC | PRN
  Administered 2014-03-23: 10 mL via INTRAVENOUS
  Filled 2014-03-23: qty 10

## 2014-03-23 MED ORDER — HEPARIN SOD (PORK) LOCK FLUSH 100 UNIT/ML IV SOLN
500.0000 [IU] | Freq: Once | INTRAVENOUS | Status: AC
Start: 2014-03-23 — End: 2014-03-23
  Administered 2014-03-23: 500 [IU] via INTRAVENOUS
  Filled 2014-03-23: qty 5

## 2014-03-23 MED ORDER — IOHEXOL 300 MG/ML  SOLN
50.0000 mL | Freq: Once | INTRAMUSCULAR | Status: AC | PRN
  Administered 2014-03-23: 50 mL via ORAL

## 2014-03-23 NOTE — Telephone Encounter (Signed)
Call report from [place] for Dr. Lurlean Leyden to please see CT Chest done today w/ special attention to Impression #1.  Dr. Alvy Bimler notified.

## 2014-03-24 ENCOUNTER — Encounter: Payer: Self-pay | Admitting: Radiation Therapy

## 2014-03-26 ENCOUNTER — Ambulatory Visit (HOSPITAL_BASED_OUTPATIENT_CLINIC_OR_DEPARTMENT_OTHER): Payer: Commercial Managed Care - HMO | Admitting: Hematology and Oncology

## 2014-03-26 ENCOUNTER — Ambulatory Visit: Payer: Medicare HMO

## 2014-03-26 ENCOUNTER — Encounter: Payer: Self-pay | Admitting: Hematology and Oncology

## 2014-03-26 ENCOUNTER — Ambulatory Visit: Payer: Medicare HMO | Admitting: Radiation Oncology

## 2014-03-26 VITALS — BP 145/63 | HR 90 | Temp 98.1°F | Resp 18 | Ht 64.0 in | Wt 164.5 lb

## 2014-03-26 DIAGNOSIS — C779 Secondary and unspecified malignant neoplasm of lymph node, unspecified: Secondary | ICD-10-CM

## 2014-03-26 DIAGNOSIS — F17200 Nicotine dependence, unspecified, uncomplicated: Secondary | ICD-10-CM

## 2014-03-26 DIAGNOSIS — Z720 Tobacco use: Secondary | ICD-10-CM

## 2014-03-26 DIAGNOSIS — C7931 Secondary malignant neoplasm of brain: Secondary | ICD-10-CM

## 2014-03-26 DIAGNOSIS — C3411 Malignant neoplasm of upper lobe, right bronchus or lung: Secondary | ICD-10-CM

## 2014-03-26 DIAGNOSIS — C7949 Secondary malignant neoplasm of other parts of nervous system: Secondary | ICD-10-CM

## 2014-03-26 NOTE — Assessment & Plan Note (Signed)
I spent some time counseling the patient the importance of tobacco cessation. She is currently not interested to quit now.

## 2014-03-26 NOTE — Assessment & Plan Note (Signed)
Clinically, she is not symptomatic. I recommend palliative chemotherapy in the near future. She tolerated carboplatin with Alimta well. The patient will go home and think about it.

## 2014-03-26 NOTE — Progress Notes (Signed)
[facility] OFFICE PROGRESS NOTE  Patient Care Team: Katheren Shams, DO as PCP - General (Family Medicine) Gaye Pollack, MD (Cardiothoracic Surgery) Thea Silversmith, MD (Radiation Oncology) Dickie La, MD (Family Medicine) Heath Lark, MD as Consulting Physician (Hematology and Oncology)  SUMMARY OF ONCOLOGIC HISTORY: Oncology History   Colon cancer   Primary site: Colon and Rectum (Left)   Staging method: AJCC 7th Edition   Clinical: Stage I (T2, N0, M0) signed by Heath Lark, MD on 05/31/2013  2:39 PM   Pathologic: Stage I (T2, N0, cM0) signed by Heath Lark, MD on 05/31/2013  2:39 PM   Summary: Stage I (T2, N0, cM0) Lung cancer, EGFR/ALK negative, recurrence after initial resection to LN and brain   Primary site: Lung (Left)   Staging method: AJCC 7th Edition   Clinical: Stage IV (T1, N2, M1b) signed by Heath Lark, MD on 05/31/2013  2:26 PM   Pathologic: Stage IV (T1, N2, M1b) signed by Heath Lark, MD on 05/31/2013  2:26 PM   Summary: Stage IV (T1, N2, M1b)       Lung cancer   03/01/2007 Procedure Colonoscopy revealed abnormalities and biopsy show high-grade dysplasia   04/01/2007 Surgery She underwent sigmoid resection which showed T2 N0 colon cancer, and negative margins and all of 17 lymph nodes were negative   02/29/2008 Procedure Repeat surveillance colonoscopy was negative.   06/16/2010 Surgery She underwent left upper lobectomy we show well-differentiated adenocarcinoma of the lung, T1, N0, M0   03/18/2011 Procedure Repeat colonoscopy show multiple polyps but there were benign   10/19/2011 Procedure Biopsy of mediastinal lymph node came back positive for recurrence of lung cancer, EGFR and ALK negative   11/16/2011 - 12/14/2011 Chemotherapy She received concurrent chemoradiation therapy with weekly carboplatin and Taxol.   11/16/2011 - 12/29/2011 Radiation Therapy She received radiation therapy with weekly chemotherapy   02/15/2012 Imaging MR of the brain showed a new  intracranial metastases. This was subsequently treated with stereotactic radiosurgery.   03/07/2012 - 04/12/2013 Chemotherapy She received chemotherapy with maintainence Alimta every 3 weeks. Chemotherapy was discontinued due to profound fatigue   03/02/2013 Procedure She had therapeutic ultrasound guidance thoracentesis for pleural effusion that came back negative for cancer   05/04/2013 Procedure She had repeat ultrasound-guided thoracentesis again and cytology was negative   05/29/2013 Imaging Repeat CT scan of the chest, abdomen and pelvis show no evidence of disease but persistent right-sided pleural effusion   06/02/2013 Surgery The patient had placement of Pleurx catheter and subsequently underwent pleurodesis.   09/22/2013 Imaging Repeat CT scan show no evidence of active disease. There are nonspecific lymphadenopathy and she is placed on observation.   03/23/2014 Imaging Repeat CT scan of the chest, abdomen and pelvis show recurrence of cancer with widespread bilateral pulmonary metastasis.    Brain metastases treated with radiosurgery   02/26/2012 Initial Diagnosis Brain metastases treated with radiosurgery    INTERVAL HISTORY: Please see below for problem oriented charting. The patient is seen here today to review test results. She continued to smoke and has mild productive cough. No hemoptysis. She is not interested to quit smoking. She continues to have fatigue.  REVIEW OF SYSTEMS:   Constitutional: Denies fevers, chills or abnormal weight loss Eyes: Denies blurriness of vision Ears, nose, mouth, throat, and face: Denies mucositis or sore throat Cardiovascular: Denies palpitation, chest discomfort or lower extremity swelling Gastrointestinal:  Denies nausea, heartburn or change in bowel habits Skin: Denies abnormal skin rashes Lymphatics:  Denies new lymphadenopathy or easy bruising Neurological:Denies numbness, tingling or new weaknesses Behavioral/Psych: Mood is stable, no new  changes  All other systems were reviewed with the patient and are negative.  I have reviewed the past medical history, past surgical history, social history and family history with the patient and they are unchanged from previous note.  ALLERGIES:  is allergic to adhesive and lisinopril.  MEDICATIONS:  Current Outpatient Prescriptions  Medication Sig Dispense Refill  . albuterol (PROVENTIL HFA;VENTOLIN HFA) 108 (90 BASE) MCG/ACT inhaler Inhale 1 puff into the lungs 2 (two) times daily. 6.7 g prn  . aspirin 81 MG chewable tablet Chew 81 mg by mouth daily.    . diclofenac sodium (VOLTAREN) 1 % GEL Apply 2 g topically 4 (four) times daily. As needed for pain 100 g 11  . FLUoxetine (PROZAC) 20 MG tablet Take 1 tablet (20 mg total) by mouth daily. 90 tablet 3  . furosemide (LASIX) 20 MG tablet Take 1 tablet (20 mg total) by mouth daily. 90 tablet 3  . lidocaine-prilocaine (EMLA) cream Apply topically as needed. Apply to porta cath site one hour prior to needle stick. 30 g prn  . mirtazapine (REMERON) 15 MG tablet Take 1 tablet (15 mg total) by mouth at bedtime. 90 tablet 0  . nebivolol (BYSTOLIC) 10 MG tablet Take 1 tablet (10 mg total) by mouth daily. 90 tablet 4  . nitroGLYCERIN (NITROSTAT) 0.4 MG SL tablet Place 1 tablet (0.4 mg total) under the tongue every 5 (five) minutes as needed. For chest pain 10 tablet 0  . omeprazole (PRILOSEC) 40 MG capsule Take 1 capsule (40 mg total) by mouth daily. 90 capsule 3  . potassium chloride SA (K-DUR,KLOR-CON) 20 MEQ tablet Take 1 tablet (20 mEq total) by mouth 2 (two) times daily. 180 tablet 0  . pravastatin (PRAVACHOL) 40 MG tablet TAKE 1 TABLET BY MOUTH ONCE DAILY 90 tablet 3  . tiZANidine (ZANAFLEX) 2 MG tablet Take 1 tablet (2 mg total) by mouth every 6 (six) hours as needed (Back Muscle Spasm). 30 tablet 3  . traMADol (ULTRAM) 50 MG tablet Take 1 tablet (50 mg total) by mouth every 6 (six) hours as needed. 60 tablet 2  . [DISCONTINUED] buPROPion  (WELLBUTRIN XL) 150 MG 24 hr tablet Take 1 tablet (150 mg total) by mouth 2 (two) times daily. 60 tablet 5   No current facility-administered medications for this visit.    PHYSICAL EXAMINATION: ECOG PERFORMANCE STATUS: 1 - Symptomatic but completely ambulatory  Filed Vitals:   03/26/14 0957  BP: 145/63  Pulse: 90  Temp: 98.1 F (36.7 C)  Resp: 18   Filed Weights   03/26/14 0957  Weight: 164 lb 8 oz (74.617 kg)    GENERAL:alert, no distress and comfortable SKIN: skin color, texture, turgor are normal, no rashes or significant lesions EYES: normal, Conjunctiva are pink and non-injected, sclera clear OROPHARYNX:no exudate, no erythema and lips, buccal mucosa, and tongue normal  NECK: supple, thyroid normal size, non-tender, without nodularity LYMPH:  no palpable lymphadenopathy in the cervical, axillary or inguinal LUNGS: clear to auscultation and percussion with normal breathing effort HEART: regular rate & rhythm and no murmurs and no lower extremity edema ABDOMEN:abdomen soft, non-tender and normal bowel sounds Musculoskeletal:no cyanosis of digits and no clubbing  NEURO: alert & oriented x 3 with fluent speech, no focal motor/sensory deficits  LABORATORY DATA:  I have reviewed the data as listed    Component Value Date/Time   NA 141  12/26/2013 0946   NA 143 09/22/2013 0837   NA 143 09/28/2011 0833   K 4.0 12/26/2013 0946   K 3.5 09/22/2013 0837   K 3.5 09/28/2011 0833   CL 106 12/26/2013 0946   CL 102 10/24/2012 1001   CL 98 09/28/2011 0833   CO2 27 12/26/2013 0946   CO2 23 09/22/2013 0837   CO2 30 09/28/2011 0833   GLUCOSE 101* 12/26/2013 0946   GLUCOSE 111 09/22/2013 0837   GLUCOSE 111* 10/24/2012 1001   GLUCOSE 112 09/28/2011 0833   BUN 12.6 03/23/2014 0924   BUN 9 12/26/2013 0946   BUN 9 09/28/2011 0833   CREATININE 0.8 03/23/2014 0924   CREATININE 0.83 12/26/2013 0946   CREATININE 0.7 09/28/2011 0833   CALCIUM 8.8 12/26/2013 0946   CALCIUM 9.4  09/22/2013 0837   CALCIUM 8.7 09/28/2011 0833   PROT 6.8 12/26/2013 0946   PROT 6.9 09/22/2013 0837   PROT 7.2 09/28/2011 0833   ALBUMIN 3.8 12/26/2013 0946   ALBUMIN 3.3* 09/22/2013 0837   AST 12 12/26/2013 0946   AST 9 09/22/2013 0837   AST 15 09/28/2011 0833   ALT <8 12/26/2013 0946   ALT <6 09/22/2013 0837   ALT 16 09/28/2011 0833   ALKPHOS 148* 12/26/2013 0946   ALKPHOS 147 09/22/2013 0837   ALKPHOS 99* 09/28/2011 0833   BILITOT 0.6 12/26/2013 0946   BILITOT 0.46 09/22/2013 0837   BILITOT 0.80 09/28/2011 0833   GFRNONAA 74* 06/12/2013 0625   GFRAA 86* 06/12/2013 0625    No results found for: SPEP, UPEP  Lab Results  Component Value Date   WBC 5.9 12/26/2013   NEUTROABS 4.5 12/26/2013   HGB 13.3 12/26/2013   HCT 42.0 12/26/2013   MCV 75.1* 12/26/2013   PLT 204 12/26/2013      Chemistry      Component Value Date/Time   NA 141 12/26/2013 0946   NA 143 09/22/2013 0837   NA 143 09/28/2011 0833   K 4.0 12/26/2013 0946   K 3.5 09/22/2013 0837   K 3.5 09/28/2011 0833   CL 106 12/26/2013 0946   CL 102 10/24/2012 1001   CL 98 09/28/2011 0833   CO2 27 12/26/2013 0946   CO2 23 09/22/2013 0837   CO2 30 09/28/2011 0833   BUN 12.6 03/23/2014 0924   BUN 9 12/26/2013 0946   BUN 9 09/28/2011 0833   CREATININE 0.8 03/23/2014 0924   CREATININE 0.83 12/26/2013 0946   CREATININE 0.7 09/28/2011 0833      Component Value Date/Time   CALCIUM 8.8 12/26/2013 0946   CALCIUM 9.4 09/22/2013 0837   CALCIUM 8.7 09/28/2011 0833   ALKPHOS 148* 12/26/2013 0946   ALKPHOS 147 09/22/2013 0837   ALKPHOS 99* 09/28/2011 0833   AST 12 12/26/2013 0946   AST 9 09/22/2013 0837   AST 15 09/28/2011 0833   ALT <8 12/26/2013 0946   ALT <6 09/22/2013 0837   ALT 16 09/28/2011 0833   BILITOT 0.6 12/26/2013 0946   BILITOT 0.46 09/22/2013 0837   BILITOT 0.80 09/28/2011 0833       RADIOGRAPHIC STUDIES:I reviewed the CT scan of the chest, abdomen and pelvis with the patient and her  daughter. I have personally reviewed the radiological images as listed and agreed with the findings in the report.  ASSESSMENT & PLAN:  Lung cancer Clinically, she is not symptomatic. I recommend palliative chemotherapy in the near future. She tolerated carboplatin with Alimta well. The patient will go home  and think about it.  Brain metastases treated with radiosurgery MRI of the brain is stable. She is not symptomatic. Recommend observation only.  TOBACCO ABUSE I spent some time counseling the patient the importance of tobacco cessation. She is currently not interested to quit now.    No orders of the defined types were placed in this encounter.   All questions were answered. The patient knows to call the clinic with any problems, questions or concerns. No barriers to learning was detected. I spent 55 minutes counseling the patient face to face. The total time spent in the appointment was 60 minutes and more than 50% was on counseling and review of test results     [facility], [place], MD 03/26/2014 4:53 PM

## 2014-03-26 NOTE — Assessment & Plan Note (Signed)
MRI of the brain is stable. She is not symptomatic. Recommend observation only.

## 2014-03-27 ENCOUNTER — Other Ambulatory Visit: Payer: Self-pay | Admitting: Hematology and Oncology

## 2014-03-27 ENCOUNTER — Telehealth: Payer: Self-pay | Admitting: *Deleted

## 2014-03-27 DIAGNOSIS — C3410 Malignant neoplasm of upper lobe, unspecified bronchus or lung: Secondary | ICD-10-CM

## 2014-03-27 NOTE — Telephone Encounter (Signed)
Pt left VM informing Dr. Alvy Bimler she wants to start chemotherapy after Christmas.

## 2014-03-28 ENCOUNTER — Encounter: Payer: Self-pay | Admitting: Radiation Oncology

## 2014-03-28 ENCOUNTER — Ambulatory Visit
Admission: RE | Admit: 2014-03-28 | Discharge: 2014-03-28 | Disposition: A | Discharge status: Home / Self Care | Payer: Medicare HMO | Source: Ambulatory Visit | Attending: Radiation Oncology | Admitting: Radiation Oncology

## 2014-03-28 ENCOUNTER — Telehealth: Payer: Self-pay | Admitting: Hematology and Oncology

## 2014-03-28 ENCOUNTER — Telehealth: Payer: Self-pay | Admitting: *Deleted

## 2014-03-28 ENCOUNTER — Encounter (HOSPITAL_COMMUNITY): Payer: Self-pay

## 2014-03-28 VITALS — BP 154/74 | HR 85 | Temp 98.3°F | Wt 165.7 lb

## 2014-03-28 DIAGNOSIS — C7931 Secondary malignant neoplasm of brain: Secondary | ICD-10-CM

## 2014-03-28 DIAGNOSIS — C7949 Secondary malignant neoplasm of other parts of nervous system: Principal | ICD-10-CM

## 2014-03-28 NOTE — Telephone Encounter (Signed)
sw pt and advised on DEC appt...pt ok and aware °

## 2014-03-28 NOTE — Progress Notes (Signed)
Radiation Oncology         ([phone]) [phone] ________________________________  Name: Lauren [name] MRN: [id number]  Date: 03/28/2014  DOB: 1950/10/21  Follow-Up Visit Note  Outpatient  CC: Diona Fanti, DO  Leone Haven, MD  Diagnosis:   Diagnosis and Prior Radiotherapy: Metastatic non-small cell lung cancer, adenocarcinoma, with 2 brain metastases. Left occipital and right insular metastases  Interval Since Last Radiation: She completed 20 Gray in 1 fraction to both of the metastases on 03/01/2012     ICD-9-CM ICD-10-CM   1. Brain metastases treated with radiosurgery 198.3 C79.31    Narrative:   Patient returned for followup today.  Most recent MRI of the brain on 03-23-14 demonstrates no evidence of new or relapsed disease.  Reviewed at tumor board this week.   Status of systemic system per CT scan on 03-23-14 demonstrates  innumerable new pulmonary nodules scattered throughout the lungs bilaterally. These are predominantly peribronchovascular in distribution, an many are cavitary. These arefavored to represent infectious or inflammatory nodules, however,the possibility of aerogenous metastases also warrants consideration. There continues to be prominent right hilar and mediastinal nodal tissue, which is also slightly increased compared to the prior study. Otherwise, no signs of potential metastatic disease in the abdomen or pelvis.  Medical oncology's plans for the patient were for palliative chemotherapy in the near future. The patient wants to start this after the holidays. She said she is still smoking, and has a new cough. No other new complaints.  ALLERGIES:  is allergic to adhesive and lisinopril.  Meds: Current Outpatient Prescriptions  Medication Sig Dispense Refill  . albuterol (PROVENTIL HFA;VENTOLIN HFA) 108 (90 BASE) MCG/ACT inhaler Inhale 1 puff into the lungs 2 (two) times daily. 6.7 g prn  . aspirin 81 MG chewable tablet Chew 81 mg by mouth daily.    .  diclofenac sodium (VOLTAREN) 1 % GEL Apply 2 g topically 4 (four) times daily. As needed for pain 100 g 11  . FLUoxetine (PROZAC) 20 MG tablet Take 1 tablet (20 mg total) by mouth daily. 90 tablet 3  . furosemide (LASIX) 20 MG tablet Take 1 tablet (20 mg total) by mouth daily. 90 tablet 3  . lidocaine-prilocaine (EMLA) cream Apply topically as needed. Apply to porta cath site one hour prior to needle stick. 30 g prn  . mirtazapine (REMERON) 15 MG tablet Take 1 tablet (15 mg total) by mouth at bedtime. 90 tablet 0  . nebivolol (BYSTOLIC) 10 MG tablet Take 1 tablet (10 mg total) by mouth daily. 90 tablet 4  . nitroGLYCERIN (NITROSTAT) 0.4 MG SL tablet Place 1 tablet (0.4 mg total) under the tongue every 5 (five) minutes as needed. For chest pain 10 tablet 0  . omeprazole (PRILOSEC) 40 MG capsule Take 1 capsule (40 mg total) by mouth daily. 90 capsule 3  . potassium chloride SA (K-DUR,KLOR-CON) 20 MEQ tablet Take 1 tablet (20 mEq total) by mouth 2 (two) times daily. 180 tablet 0  . pravastatin (PRAVACHOL) 40 MG tablet TAKE 1 TABLET BY MOUTH ONCE DAILY 90 tablet 3  . tiZANidine (ZANAFLEX) 2 MG tablet Take 1 tablet (2 mg total) by mouth every 6 (six) hours as needed (Back Muscle Spasm). 30 tablet 3  . traMADol (ULTRAM) 50 MG tablet Take 1 tablet (50 mg total) by mouth every 6 (six) hours as needed. 60 tablet 2  . [DISCONTINUED] buPROPion (WELLBUTRIN XL) 150 MG 24 hr tablet Take 1 tablet (150 mg total) by mouth 2 (two)  times daily. 60 tablet 5   No current facility-administered medications for this encounter.    Physical Findings: The patient is in no acute distress. Patient is alert and oriented.  weight is 165 lb 11.2 oz (75.161 kg). Her temperature is 98.3 F (36.8 C). Her blood pressure is 154/74 and her pulse is 85. Her oxygen saturation is 99%. .   General: Alert and oriented, in no acute distress HEENT: Head is normocephalic.Extraocular movements are intact. Oropharynx is clear. Heart:  Regular in rate and rhythm with no murmurs, rubs, or gallops. Chest: Clear to auscultation bilaterally, with no rhonchi, wheezes, or rales. Extremities: No cyanosis or edema. Musculoskeletal: symmetric strength and muscle tone throughout. Neurologic: Cranial nerves II through XII are grossly intact. No obvious focalities. Speech is fluent. Coordination is intact. Psychiatric: Judgment and insight are intact. Affect is appropriate.  Lab Findings: Lab Results  Component Value Date   WBC 5.9 12/26/2013   HGB 13.3 12/26/2013   HCT 42.0 12/26/2013   MCV 75.1* 12/26/2013   PLT 204 12/26/2013      CBC    Component Value Date/Time   WBC 5.9 12/26/2013 0945   WBC 6.5 06/12/2013 0625   RBC 5.59* 12/26/2013 0945   RBC 5.16* 06/12/2013 0625   HGB 13.3 12/26/2013 0945   HGB 12.9 06/12/2013 0625   HCT 42.0 12/26/2013 0945   HCT 40.8 06/12/2013 0625   PLT 204 12/26/2013 0945   PLT 202 06/12/2013 0625   MCV 75.1* 12/26/2013 0945   MCV 79.1 06/12/2013 0625   MCH 23.7* 12/26/2013 0945   MCH 25.0* 06/12/2013 0625   MCHC 31.6 12/26/2013 0945   MCHC 31.6 06/12/2013 0625   RDW 17.2* 12/26/2013 0945   RDW 17.3* 06/12/2013 0625   LYMPHSABS 0.8* 12/26/2013 0945   LYMPHSABS 0.8 03/02/2013 0050   MONOABS 0.5 12/26/2013 0945   MONOABS 0.1 03/02/2013 0050   EOSABS 0.1 12/26/2013 0945   EOSABS 0.1 03/02/2013 0050   BASOSABS 0.0 12/26/2013 0945   BASOSABS 0.0 03/02/2013 0050      Radiographic Findings: See above   Impression/Plan:  Brain well controlled, but due to findings in lungs, she plans to proceed with salvage chemotherapy.  Followup MRI of brain recommended by me to be done in 3 mo, but she prefers in 62mo.  We will arrange this for her.  I wished her the best until then.  _________________   Eppie Gibson, MD

## 2014-03-28 NOTE — Progress Notes (Signed)
Patient here for routine follow up metastatic lung cancer to brain.Mri of brain reveals no change but patient states she has to have chemotherapy for metastatic lung mets.Denies pain.Productive cough of clear secretions.Shortness of breath on-going.

## 2014-03-28 NOTE — Telephone Encounter (Signed)
-----   Message from Heath Lark, MD sent at 03/27/2014  7:39 PM EST ----- Regarding: orders I placed orders for labs, scans, rtn visit and chemo to start after Christmas

## 2014-03-29 ENCOUNTER — Telehealth: Payer: Self-pay | Admitting: *Deleted

## 2014-03-29 ENCOUNTER — Other Ambulatory Visit: Payer: Self-pay | Admitting: Radiation Therapy

## 2014-03-29 DIAGNOSIS — C7931 Secondary malignant neoplasm of brain: Secondary | ICD-10-CM

## 2014-03-29 DIAGNOSIS — C7949 Secondary malignant neoplasm of other parts of nervous system: Principal | ICD-10-CM

## 2014-03-30 NOTE — Telephone Encounter (Signed)
Pt called states she originally didn't understand need for MRI brain, so she did not schedule it but now wants to schedule it.  Called Radiology Scheduler and MRI brain added to same day after CT chest on 12/28.

## 2014-04-09 ENCOUNTER — Telehealth: Payer: Self-pay | Admitting: Hematology and Oncology

## 2014-04-09 NOTE — Telephone Encounter (Signed)
s.w pt and advised on all appts....she wans lab and flush b4 ct

## 2014-04-11 ENCOUNTER — Encounter: Payer: Self-pay | Admitting: *Deleted

## 2014-04-11 NOTE — Progress Notes (Signed)
[place] Work  Clinical Social Work was referred by patient for assessment of psychosocial needs due to progression of disease.  Clinical Social Worker contacted patient at home to offer support and assess for needs.  CSW works with pt monthly at [place]. Pt was concerned about well-being of other patients. CSW and pt talked briefly and pt is aware to reach out as needed. She hopes to attend group on January 7, depending on her health.   Clinical Social Work interventions: Emotional support  Loren Racer, [place] Social Worker [employer]  [place] Phone: [phone] Fax: [phone]

## 2014-05-09 ENCOUNTER — Other Ambulatory Visit: Payer: Self-pay | Admitting: Medical Oncology

## 2014-05-09 DIAGNOSIS — C3410 Malignant neoplasm of upper lobe, unspecified bronchus or lung: Secondary | ICD-10-CM

## 2014-05-14 ENCOUNTER — Ambulatory Visit (HOSPITAL_COMMUNITY)
Admission: RE | Admit: 2014-05-14 | Discharge: 2014-05-14 | Disposition: A | Discharge status: Home / Self Care | Payer: Commercial Managed Care - HMO | Source: Ambulatory Visit | Attending: Hematology and Oncology | Admitting: Hematology and Oncology

## 2014-05-14 ENCOUNTER — Ambulatory Visit (HOSPITAL_BASED_OUTPATIENT_CLINIC_OR_DEPARTMENT_OTHER): Payer: Commercial Managed Care - HMO

## 2014-05-14 ENCOUNTER — Ambulatory Visit (HOSPITAL_COMMUNITY): Payer: Medicare HMO

## 2014-05-14 ENCOUNTER — Other Ambulatory Visit: Payer: Self-pay | Admitting: Hematology and Oncology

## 2014-05-14 ENCOUNTER — Other Ambulatory Visit (HOSPITAL_BASED_OUTPATIENT_CLINIC_OR_DEPARTMENT_OTHER): Payer: Commercial Managed Care - HMO

## 2014-05-14 ENCOUNTER — Other Ambulatory Visit: Payer: Self-pay

## 2014-05-14 ENCOUNTER — Encounter (HOSPITAL_COMMUNITY): Payer: Self-pay

## 2014-05-14 VITALS — BP 140/56 | HR 77 | Temp 98.0°F

## 2014-05-14 DIAGNOSIS — C3490 Malignant neoplasm of unspecified part of unspecified bronchus or lung: Secondary | ICD-10-CM | POA: Diagnosis not present

## 2014-05-14 DIAGNOSIS — C3410 Malignant neoplasm of upper lobe, unspecified bronchus or lung: Secondary | ICD-10-CM

## 2014-05-14 DIAGNOSIS — R05 Cough: Secondary | ICD-10-CM | POA: Diagnosis not present

## 2014-05-14 DIAGNOSIS — C7931 Secondary malignant neoplasm of brain: Secondary | ICD-10-CM | POA: Diagnosis not present

## 2014-05-14 DIAGNOSIS — Z9049 Acquired absence of other specified parts of digestive tract: Secondary | ICD-10-CM | POA: Insufficient documentation

## 2014-05-14 DIAGNOSIS — N281 Cyst of kidney, acquired: Secondary | ICD-10-CM | POA: Insufficient documentation

## 2014-05-14 DIAGNOSIS — E041 Nontoxic single thyroid nodule: Secondary | ICD-10-CM | POA: Insufficient documentation

## 2014-05-14 DIAGNOSIS — I2510 Atherosclerotic heart disease of native coronary artery without angina pectoris: Secondary | ICD-10-CM | POA: Insufficient documentation

## 2014-05-14 DIAGNOSIS — Z85038 Personal history of other malignant neoplasm of large intestine: Secondary | ICD-10-CM | POA: Insufficient documentation

## 2014-05-14 DIAGNOSIS — I700 Atherosclerosis of aorta: Secondary | ICD-10-CM | POA: Diagnosis not present

## 2014-05-14 DIAGNOSIS — Z923 Personal history of irradiation: Secondary | ICD-10-CM | POA: Insufficient documentation

## 2014-05-14 DIAGNOSIS — C7949 Secondary malignant neoplasm of other parts of nervous system: Principal | ICD-10-CM

## 2014-05-14 DIAGNOSIS — Z9221 Personal history of antineoplastic chemotherapy: Secondary | ICD-10-CM | POA: Insufficient documentation

## 2014-05-14 DIAGNOSIS — C3412 Malignant neoplasm of upper lobe, left bronchus or lung: Secondary | ICD-10-CM

## 2014-05-14 DIAGNOSIS — R918 Other nonspecific abnormal finding of lung field: Secondary | ICD-10-CM | POA: Insufficient documentation

## 2014-05-14 DIAGNOSIS — Z95828 Presence of other vascular implants and grafts: Secondary | ICD-10-CM

## 2014-05-14 LAB — BASIC METABOLIC PANEL (CC13)
Anion Gap: 9 mEq/L (ref 3–11)
BUN: 12.1 mg/dL (ref 7.0–26.0)
CALCIUM: 8.8 mg/dL (ref 8.4–10.4)
CHLORIDE: 107 meq/L (ref 98–109)
CO2: 27 meq/L (ref 22–29)
Creatinine: 0.8 mg/dL (ref 0.6–1.1)
EGFR: 88 mL/min/{1.73_m2} — ABNORMAL LOW (ref >90)
Glucose: 99 mg/dl (ref 70–140)
Potassium: 3.7 mEq/L (ref 3.5–5.1)
Sodium: 143 mEq/L (ref 136–145)

## 2014-05-14 MED ORDER — SODIUM CHLORIDE 0.9 % IJ SOLN
10.0000 mL | INTRAMUSCULAR | Status: DC | PRN
  Administered 2014-05-14: 10 mL via INTRAVENOUS
  Filled 2014-05-14: qty 10

## 2014-05-14 MED ORDER — HEPARIN SOD (PORK) LOCK FLUSH 100 UNIT/ML IV SOLN
500.0000 [IU] | Freq: Once | INTRAVENOUS | Status: AC
  Administered 2014-05-14: 500 [IU] via INTRAVENOUS
  Filled 2014-05-14: qty 5

## 2014-05-14 MED ORDER — IOHEXOL 300 MG/ML  SOLN
80.0000 mL | Freq: Once | INTRAMUSCULAR | Status: AC | PRN
  Administered 2014-05-14: 80 mL via INTRAVENOUS

## 2014-05-14 MED ORDER — GADOBENATE DIMEGLUMINE 529 MG/ML IV SOLN
15.0000 mL | Freq: Once | INTRAVENOUS | Status: AC | PRN
  Administered 2014-05-14: 15 mL via INTRAVENOUS

## 2014-05-14 NOTE — Patient Instructions (Signed)

## 2014-05-15 ENCOUNTER — Encounter: Payer: Self-pay | Admitting: Hematology and Oncology

## 2014-05-15 ENCOUNTER — Telehealth: Payer: Self-pay | Admitting: *Deleted

## 2014-05-15 ENCOUNTER — Ambulatory Visit (HOSPITAL_BASED_OUTPATIENT_CLINIC_OR_DEPARTMENT_OTHER): Payer: Commercial Managed Care - HMO

## 2014-05-15 ENCOUNTER — Telehealth: Payer: Self-pay | Admitting: Hematology and Oncology

## 2014-05-15 ENCOUNTER — Encounter: Payer: Self-pay | Admitting: *Deleted

## 2014-05-15 ENCOUNTER — Ambulatory Visit (HOSPITAL_BASED_OUTPATIENT_CLINIC_OR_DEPARTMENT_OTHER): Payer: Commercial Managed Care - HMO | Admitting: Hematology and Oncology

## 2014-05-15 VITALS — BP 135/56 | HR 76 | Temp 97.9°F | Resp 18 | Ht 64.0 in | Wt 169.3 lb

## 2014-05-15 DIAGNOSIS — C7931 Secondary malignant neoplasm of brain: Secondary | ICD-10-CM

## 2014-05-15 DIAGNOSIS — C3412 Malignant neoplasm of upper lobe, left bronchus or lung: Secondary | ICD-10-CM

## 2014-05-15 DIAGNOSIS — F17200 Nicotine dependence, unspecified, uncomplicated: Secondary | ICD-10-CM

## 2014-05-15 DIAGNOSIS — C7949 Secondary malignant neoplasm of other parts of nervous system: Principal | ICD-10-CM

## 2014-05-15 DIAGNOSIS — C3410 Malignant neoplasm of upper lobe, unspecified bronchus or lung: Secondary | ICD-10-CM

## 2014-05-15 DIAGNOSIS — Z720 Tobacco use: Secondary | ICD-10-CM

## 2014-05-15 LAB — COMPREHENSIVE METABOLIC PANEL (CC13)
ALT: 6 U/L (ref 0–55)
ANION GAP: 8 meq/L (ref 3–11)
AST: 11 U/L (ref 5–34)
Albumin: 3.1 g/dL — ABNORMAL LOW (ref 3.5–5.0)
Alkaline Phosphatase: 157 U/L — ABNORMAL HIGH (ref 40–150)
BILIRUBIN TOTAL: 0.48 mg/dL (ref 0.20–1.20)
BUN: 7 mg/dL (ref 7.0–26.0)
CALCIUM: 9 mg/dL (ref 8.4–10.4)
CHLORIDE: 106 meq/L (ref 98–109)
CO2: 29 mEq/L (ref 22–29)
CREATININE: 0.8 mg/dL (ref 0.6–1.1)
EGFR: 87 mL/min/{1.73_m2} — ABNORMAL LOW (ref >90)
Glucose: 91 mg/dl (ref 70–140)
Potassium: 3.7 mEq/L (ref 3.5–5.1)
Sodium: 143 mEq/L (ref 136–145)
Total Protein: 7 g/dL (ref 6.4–8.3)

## 2014-05-15 LAB — CBC WITH DIFFERENTIAL/PLATELET
BASO%: 0.5 % (ref 0.0–2.0)
Basophils Absolute: 0 10*3/uL (ref 0.0–0.1)
EOS%: 1.7 % (ref 0.0–7.0)
Eosinophils Absolute: 0.1 10*3/uL (ref 0.0–0.5)
HEMATOCRIT: 39.8 % (ref 34.8–46.6)
HGB: 12.3 g/dL (ref 11.6–15.9)
LYMPH#: 1 10*3/uL (ref 0.9–3.3)
LYMPH%: 17.6 % (ref 14.0–49.7)
MCH: 23.2 pg — ABNORMAL LOW (ref 25.1–34.0)
MCHC: 30.9 g/dL — AB (ref 31.5–36.0)
MCV: 75 fL — ABNORMAL LOW (ref 79.5–101.0)
MONO#: 0.4 10*3/uL (ref 0.1–0.9)
MONO%: 7.2 % (ref 0.0–14.0)
NEUT#: 4.2 10*3/uL (ref 1.5–6.5)
NEUT%: 73 % (ref 38.4–76.8)
Platelets: 234 10*3/uL (ref 145–400)
RBC: 5.31 10*6/uL (ref 3.70–5.45)
RDW: 17 % — ABNORMAL HIGH (ref 11.2–14.5)
WBC: 5.8 10*3/uL (ref 3.9–10.3)

## 2014-05-15 MED ORDER — ONDANSETRON HCL 8 MG PO TABS: 8.0000 mg | ORAL_TABLET | Freq: Three times a day (TID) | ORAL | Status: DC | PRN

## 2014-05-15 MED ORDER — PROCHLORPERAZINE MALEATE 10 MG PO TABS: 10.0000 mg | ORAL_TABLET | Freq: Four times a day (QID) | ORAL | Status: DC | PRN

## 2014-05-15 MED ORDER — CYANOCOBALAMIN 1000 MCG/ML IJ SOLN
1000.0000 ug | Freq: Once | INTRAMUSCULAR | Status: AC
  Administered 2014-05-15: 1000 ug via INTRAMUSCULAR

## 2014-05-15 MED ORDER — FOLIC ACID 1 MG PO TABS: 1.0000 mg | ORAL_TABLET | Freq: Every day | ORAL | Status: DC

## 2014-05-15 NOTE — Assessment & Plan Note (Signed)
MRI is negative for disease recurrence. She is not symptomatic.

## 2014-05-15 NOTE — Assessment & Plan Note (Signed)
Overall, she is not symptomatic from distant metastasis. She has enjoyed all the major holidays and is now ready to begin palliative chemotherapy. We discussed the role of chemotherapy. The intent is for palliative.  We discussed some of the risks, benefits, side-effects of Alimta/carboplatin. Some of the short term side-effects included, though not limited to, including weight loss, life threatening infections, risk of allergic reactions, need for transfusions of blood products, nausea, vomiting, change in bowel habits, loss of hair, admission to hospital for various reasons, and risks of death.   Long term side-effects are also discussed including risks of infertility, permanent damage to nerve function, hearing loss, chronic fatigue, kidney damage with possibility needing hemodialysis, and rare secondary malignancy including bone marrow disorders.  The patient is aware that the response rates discussed earlier is not guaranteed.  After a long discussion, patient made an informed decision to proceed with the prescribed plan of care. Patient education material was dispensed. I recommend 4 cycles of combination therapy and repeat imaging study to assess response to treatment. After that, I plan to drop carboplatin and go on maintenance Alimta as before and she agreed. She will get vitamin B-12 injection today. I have prescribed folic acid for her to take on a regular basis.

## 2014-05-15 NOTE — Progress Notes (Signed)
[place] Social Work  Clinical Social Work was referred by MD  to review and complete healthcare advance directives.  Clinical Social Worker met with patient in [place] office.  The patient designated Lauren [name] as their primary healthcare agent.  Patient also completed healthcare living will.    Clinical Social Worker notarized documents and made copies for patient/family. Clinical Social Worker will send documents to medical records to be scanned into patient's chart. Clinical Social Worker encouraged patient/family to contact with any additional questions or concerns.  Lauren [name], MSW, LCSW, OSW-C Clinical Social Worker [facility] [phone]

## 2014-05-15 NOTE — Telephone Encounter (Signed)
No call

## 2014-05-15 NOTE — Telephone Encounter (Signed)
Pt confirmed labs/ov per 12/29 POF, gave pt AVS.... KJ, sent msg to add chemo

## 2014-05-15 NOTE — Telephone Encounter (Signed)
Per staff message and POF I have scheduled appts. Advised scheduler of appts. JMW  

## 2014-05-15 NOTE — Progress Notes (Signed)
[place] OFFICE PROGRESS NOTE  Patient Care Team: Katheren Shams, DO as PCP - General (Family Medicine) Gaye Pollack, MD (Cardiothoracic Surgery) Thea Silversmith, MD (Radiation Oncology) Dickie La, MD (Family Medicine) Heath Lark, MD as Consulting Physician (Hematology and Oncology)  SUMMARY OF ONCOLOGIC HISTORY: Oncology History   Colon cancer   Primary site: Colon and Rectum (Left)   Staging method: AJCC 7th Edition   Clinical: Stage I (T2, N0, M0) signed by Heath Lark, MD on 05/31/2013  2:39 PM   Pathologic: Stage I (T2, N0, cM0) signed by Heath Lark, MD on 05/31/2013  2:39 PM   Summary: Stage I (T2, N0, cM0) Lung cancer, EGFR/ALK negative, recurrence after initial resection to LN and brain   Primary site: Lung (Left)   Staging method: AJCC 7th Edition   Clinical: Stage IV (T1, N2, M1b) signed by Heath Lark, MD on 05/31/2013  2:26 PM   Pathologic: Stage IV (T1, N2, M1b) signed by Heath Lark, MD on 05/31/2013  2:26 PM   Summary: Stage IV (T1, N2, M1b)       Lung cancer   03/01/2007 Procedure Colonoscopy revealed abnormalities and biopsy show high-grade dysplasia   04/01/2007 Surgery She underwent sigmoid resection which showed T2 N0 colon cancer, and negative margins and all of 17 lymph nodes were negative   02/29/2008 Procedure Repeat surveillance colonoscopy was negative.   06/16/2010 Surgery She underwent left upper lobectomy we show well-differentiated adenocarcinoma of the lung, T1, N0, M0   03/18/2011 Procedure Repeat colonoscopy show multiple polyps but there were benign   10/19/2011 Procedure Biopsy of mediastinal lymph node came back positive for recurrence of lung cancer, EGFR and ALK negative   11/16/2011 - 12/14/2011 Chemotherapy She received concurrent chemoradiation therapy with weekly carboplatin and Taxol.   11/16/2011 - 12/29/2011 Radiation Therapy She received radiation therapy with weekly chemotherapy   02/15/2012 Imaging MR of the brain showed a new  intracranial metastases. This was subsequently treated with stereotactic radiosurgery.   03/07/2012 - 04/12/2013 Chemotherapy She received chemotherapy with maintainence Alimta every 3 weeks. Chemotherapy was discontinued due to profound fatigue   03/02/2013 Procedure She had therapeutic ultrasound guidance thoracentesis for pleural effusion that came back negative for cancer   05/04/2013 Procedure She had repeat ultrasound-guided thoracentesis again and cytology was negative   05/29/2013 Imaging Repeat CT scan of the chest, abdomen and pelvis show no evidence of disease but persistent right-sided pleural effusion   06/02/2013 Surgery The patient had placement of Pleurx catheter and subsequently underwent pleurodesis.   09/22/2013 Imaging Repeat CT scan show no evidence of active disease. There are nonspecific lymphadenopathy and she is placed on observation.   03/23/2014 Imaging Repeat CT scan of the chest, abdomen and pelvis show recurrence of cancer with widespread bilateral pulmonary metastasis.   05/14/2014 Imaging Imaging of the chest and brain were repeated due to delay of initiation of chemotherapy. Overall, chest CT scan show stable disease. MRI of the head was negative for recurrence    Brain metastases treated with radiosurgery   02/26/2012 Initial Diagnosis Brain metastases treated with radiosurgery    INTERVAL HISTORY: Please see below for problem oriented charting. She returns prior to initiation of palliative chemotherapy. She has no new symptoms.  REVIEW OF SYSTEMS:   Constitutional: Denies fevers, chills or abnormal weight loss Eyes: Denies blurriness of vision Ears, nose, mouth, throat, and face: Denies mucositis or sore throat Respiratory: Denies cough, dyspnea or wheezes Cardiovascular: Denies palpitation, chest discomfort  or lower extremity swelling Gastrointestinal:  Denies nausea, heartburn or change in bowel habits Skin: Denies abnormal skin rashes Lymphatics: Denies new  lymphadenopathy or easy bruising Neurological:Denies numbness, tingling or new weaknesses Behavioral/Psych: Mood is stable, no new changes  All other systems were reviewed with the patient and are negative.  I have reviewed the past medical history, past surgical history, social history and family history with the patient and they are unchanged from previous note.  ALLERGIES:  is allergic to adhesive and lisinopril.  MEDICATIONS:  Current Outpatient Prescriptions  Medication Sig Dispense Refill  . albuterol (PROVENTIL HFA;VENTOLIN HFA) 108 (90 BASE) MCG/ACT inhaler Inhale 1 puff into the lungs 2 (two) times daily. 6.7 g prn  . aspirin 81 MG chewable tablet Chew 81 mg by mouth daily.    . diclofenac sodium (VOLTAREN) 1 % GEL Apply 2 g topically 4 (four) times daily. As needed for pain 100 g 11  . FLUoxetine (PROZAC) 20 MG tablet Take 1 tablet (20 mg total) by mouth daily. 90 tablet 3  . furosemide (LASIX) 20 MG tablet Take 1 tablet (20 mg total) by mouth daily. 90 tablet 3  . lidocaine-prilocaine (EMLA) cream Apply topically as needed. Apply to porta cath site one hour prior to needle stick. 30 g prn  . mirtazapine (REMERON) 15 MG tablet Take 1 tablet (15 mg total) by mouth at bedtime. 90 tablet 0  . nebivolol (BYSTOLIC) 10 MG tablet Take 1 tablet (10 mg total) by mouth daily. 90 tablet 4  . nitroGLYCERIN (NITROSTAT) 0.4 MG SL tablet Place 1 tablet (0.4 mg total) under the tongue every 5 (five) minutes as needed. For chest pain 10 tablet 0  . omeprazole (PRILOSEC) 40 MG capsule Take 1 capsule (40 mg total) by mouth daily. 90 capsule 3  . potassium chloride SA (K-DUR,KLOR-CON) 20 MEQ tablet Take 1 tablet (20 mEq total) by mouth 2 (two) times daily. 180 tablet 0  . pravastatin (PRAVACHOL) 40 MG tablet TAKE 1 TABLET BY MOUTH ONCE DAILY 90 tablet 3  . tiZANidine (ZANAFLEX) 2 MG tablet Take 1 tablet (2 mg total) by mouth every 6 (six) hours as needed (Back Muscle Spasm). 30 tablet 3  . traMADol  (ULTRAM) 50 MG tablet Take 1 tablet (50 mg total) by mouth every 6 (six) hours as needed. 60 tablet 2  . folic acid (FOLVITE) 1 MG tablet Take 1 tablet (1 mg total) by mouth daily. 100 tablet 3  . ondansetron (ZOFRAN) 8 MG tablet Take 1 tablet (8 mg total) by mouth every 8 (eight) hours as needed. 30 tablet 1  . prochlorperazine (COMPAZINE) 10 MG tablet Take 1 tablet (10 mg total) by mouth every 6 (six) hours as needed (Nausea or vomiting). 30 tablet 1  . [DISCONTINUED] buPROPion (WELLBUTRIN XL) 150 MG 24 hr tablet Take 1 tablet (150 mg total) by mouth 2 (two) times daily. 60 tablet 5   No current facility-administered medications for this visit.    PHYSICAL EXAMINATION: ECOG PERFORMANCE STATUS: 0 - Asymptomatic  Filed Vitals:   05/15/14 1212  BP: 135/56  Pulse: 76  Temp: 97.9 F (36.6 C)  Resp: 18   Filed Weights   05/15/14 1212  Weight: 169 lb 4.8 oz (76.794 kg)    GENERAL:alert, no distress and comfortable SKIN: skin color, texture, turgor are normal, no rashes or significant lesions EYES: normal, Conjunctiva are pink and non-injected, sclera clear OROPHARYNX:no exudate, no erythema and lips, buccal mucosa, and tongue normal  NECK: supple, thyroid  normal size, non-tender, without nodularity LYMPH:  no palpable lymphadenopathy in the cervical, axillary or inguinal LUNGS: clear to auscultation and percussion with normal breathing effort HEART: regular rate & rhythm and no murmurs and no lower extremity edema ABDOMEN:abdomen soft, non-tender and normal bowel sounds Musculoskeletal:no cyanosis of digits and no clubbing  NEURO: alert & oriented x 3 with fluent speech, no focal motor/sensory deficits  LABORATORY DATA:  I have reviewed the data as listed    Component Value Date/Time   NA 143 05/15/2014 1305   NA 141 12/26/2013 0946   NA 143 09/28/2011 0833   K 3.7 05/15/2014 1305   K 4.0 12/26/2013 0946   K 3.5 09/28/2011 0833   CL 106 12/26/2013 0946   CL 102 10/24/2012  1001   CL 98 09/28/2011 0833   CO2 29 05/15/2014 1305   CO2 27 12/26/2013 0946   CO2 30 09/28/2011 0833   GLUCOSE 91 05/15/2014 1305   GLUCOSE 101* 12/26/2013 0946   GLUCOSE 111* 10/24/2012 1001   GLUCOSE 112 09/28/2011 0833   BUN 7.0 05/15/2014 1305   BUN 9 12/26/2013 0946   BUN 9 09/28/2011 0833   CREATININE 0.8 05/15/2014 1305   CREATININE 0.83 12/26/2013 0946   CREATININE 0.7 09/28/2011 0833   CALCIUM 9.0 05/15/2014 1305   CALCIUM 8.8 12/26/2013 0946   CALCIUM 8.7 09/28/2011 0833   PROT 7.0 05/15/2014 1305   PROT 6.8 12/26/2013 0946   PROT 7.2 09/28/2011 0833   ALBUMIN 3.1* 05/15/2014 1305   ALBUMIN 3.8 12/26/2013 0946   AST 11 05/15/2014 1305   AST 12 12/26/2013 0946   AST 15 09/28/2011 0833   ALT 6 05/15/2014 1305   ALT <8 12/26/2013 0946   ALT 16 09/28/2011 0833   ALKPHOS 157* 05/15/2014 1305   ALKPHOS 148* 12/26/2013 0946   ALKPHOS 99* 09/28/2011 0833   BILITOT 0.48 05/15/2014 1305   BILITOT 0.6 12/26/2013 0946   BILITOT 0.80 09/28/2011 0833   GFRNONAA 74* 06/12/2013 0625   GFRAA 86* 06/12/2013 0625    No results found for: SPEP, UPEP  Lab Results  Component Value Date   WBC 5.8 05/15/2014   NEUTROABS 4.2 05/15/2014   HGB 12.3 05/15/2014   HCT 39.8 05/15/2014   MCV 75.0* 05/15/2014   PLT 234 05/15/2014      Chemistry      Component Value Date/Time   NA 143 05/15/2014 1305   NA 141 12/26/2013 0946   NA 143 09/28/2011 0833   K 3.7 05/15/2014 1305   K 4.0 12/26/2013 0946   K 3.5 09/28/2011 0833   CL 106 12/26/2013 0946   CL 102 10/24/2012 1001   CL 98 09/28/2011 0833   CO2 29 05/15/2014 1305   CO2 27 12/26/2013 0946   CO2 30 09/28/2011 0833   BUN 7.0 05/15/2014 1305   BUN 9 12/26/2013 0946   BUN 9 09/28/2011 0833   CREATININE 0.8 05/15/2014 1305   CREATININE 0.83 12/26/2013 0946   CREATININE 0.7 09/28/2011 0833      Component Value Date/Time   CALCIUM 9.0 05/15/2014 1305   CALCIUM 8.8 12/26/2013 0946   CALCIUM 8.7 09/28/2011 0833    ALKPHOS 157* 05/15/2014 1305   ALKPHOS 148* 12/26/2013 0946   ALKPHOS 99* 09/28/2011 0833   AST 11 05/15/2014 1305   AST 12 12/26/2013 0946   AST 15 09/28/2011 0833   ALT 6 05/15/2014 1305   ALT <8 12/26/2013 0946   ALT 16 09/28/2011 2010  BILITOT 0.48 05/15/2014 1305   BILITOT 0.6 12/26/2013 0946   BILITOT 0.80 09/28/2011 8264       RADIOGRAPHIC STUDIES: I have personally reviewed the radiological images as listed and agreed with the findings in the report. Ct Chest W Contrast  05/14/2014   CLINICAL DATA:  Restaging of lung cancer. Diagnosed in 2012 in 2013. Colon cancer 7 years ago. Chemotherapy and radiation therapy complete. Colon resection. Cough. Shortness of breath.  EXAM: CT CHEST WITH CONTRAST  TECHNIQUE: Multidetector CT imaging of the chest was performed during intravenous contrast administration.  CONTRAST:  25m OMNIPAQUE IOHEXOL 300 MG/ML  SOLN  COMPARISON:  03/23/2014  FINDINGS: Lungs/Pleura: Right greater the left pulmonary nodules are again identified. Many are cavitary. Minimal increase in posterior right upper lobe 11 mm nodule on image 16. 9 mm on the prior exam (when remeasured). Increased number of nodules. Index left upper lobe nodule measures 6 mm on image 21 and is either new or enlarged since the prior exam. No lobar consolidation. Radiation fibrosis in the paramediastinal right upper lung is similar.  No pleural fluid. A right-sided Port-A-Cath which terminates in the mid right atrium. Similar.  Heart/Mediastinum: No supraclavicular adenopathy. Left-sided thyroid nodule measures 1.7 cm and is similar back to the January exam.  Advanced aortic atherosclerosis. Normal heart size, without pericardial effusion. Multivessel coronary artery atherosclerosis. No central pulmonary embolism, on this non-dedicated study. Soft tissue thickening throughout the upper mediastinum is similar, including on image 22 of series 2. No well-defined adenopathy in this area. Right infrahilar  nodal tissue is upper normal and similar on image 27. Right cardiophrenic angle nodes measure up to 7 mm today versus 8 mm on the prior exam (when remeasured).  Upper Abdomen: Too small to characterize hepatic lesions which are unchanged. Normal imaged portions of the spleen, stomach, pancreas, right Kidney, adrenal glands. Upper pole left renal cyst.  Bones/Musculoskeletal: No acute osseous abnormality. Convex right thoracic spine curvature  IMPRESSION: 1. Right greater the left pulmonary nodules, some of which are cavitary. Although difficult to directly compare, secondary to their small size, felt to be slightly progressive in number and size since the prior exam. These remain indeterminate. Considerations include infection (including atypical etiologies) or pulmonary metastatic disease. 2. No well-defined thoracic adenopathy. Similar soft tissue density throughout the mediastinum which may be radiation induced. Recommend attention on follow-up. 3. Prominent right cardiophrenic angle nodes are similar to slightly decreased. Indeterminate. Recommend attention on follow-up. 4. Right Port-A-Cath terminating the mid right atrium, similar. If low SVC positioned is desired, this should be retracted on the order of 3 cm.   Electronically Signed   By: KAbigail MiyamotoM.D.   On: 05/14/2014 09:37   Mr BJeri CosWBRContrast  05/14/2014   CLINICAL DATA:  Lung cancer with known metastatic disease to the brain. S RS protocol for staging.  EXAM: MRI HEAD WITHOUT AND WITH CONTRAST  TECHNIQUE: Multiplanar, multiecho pulse sequences of the brain and surrounding structures were obtained without and with intravenous contrast.  CONTRAST:  169mMULTIHANCE GADOBENATE DIMEGLUMINE 529 MG/ML IV SOLN  COMPARISON:  03/23/2014.  11/16/2013.  07/21/2013.  FINDINGS: There is no evidence of new or worsening disease. Punctate focus of enhancement in the opercular region on the right is unchanged from previous study consistent with treated  disease. 5 mm surface enhancement in the left occipital lobe is unchanged and consistent with treated disease.  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 Minimal chronic small vessel change of  the hemispheric white matter elsewhere appears the same. No hydrocephalus. No extra-axial collection. Sinuses are clear.  Previously demonstrated hypo enhancing focus in the pituitary gland consistent with microadenoma is unchanged.  IMPRESSION: No new or progressive disease.  Treated disease in the right operculum and left occipital lobe without evidence of recurrence.  Pituitary microadenoma, unchanged.   Electronically Signed   By: Nelson Chimes M.D.   On: 05/14/2014 10:50     ASSESSMENT & PLAN:  Lung cancer Overall, she is not symptomatic from distant metastasis. She has enjoyed all the major holidays and is now ready to begin palliative chemotherapy. We discussed the role of chemotherapy. The intent is for palliative.  We discussed some of the risks, benefits, side-effects of Alimta/carboplatin. Some of the short term side-effects included, though not limited to, including weight loss, life threatening infections, risk of allergic reactions, need for transfusions of blood products, nausea, vomiting, change in bowel habits, loss of hair, admission to hospital for various reasons, and risks of death.   Long term side-effects are also discussed including risks of infertility, permanent damage to nerve function, hearing loss, chronic fatigue, kidney damage with possibility needing hemodialysis, and rare secondary malignancy including bone marrow disorders.  The patient is aware that the response rates discussed earlier is not guaranteed.  After a long discussion, patient made an informed decision to proceed with the prescribed plan of care. Patient education material was dispensed. I recommend 4 cycles of combination therapy and repeat imaging study to assess response to treatment. After that, I  plan to drop carboplatin and go on maintenance Alimta as before and she agreed. She will get vitamin B-12 injection today. I have prescribed folic acid for her to take on a regular basis.    Brain metastases treated with radiosurgery MRI is negative for disease recurrence. She is not symptomatic.  TOBACCO ABUSE I spent some time counseling the patient the importance of tobacco cessation. She is currently not interested to quit now.    Orders Placed This Encounter  Procedures  . CBC with Differential    Standing Status: Standing     Number of Occurrences: 20     Standing Expiration Date: 05/16/2015  . Comprehensive metabolic panel    Standing Status: Standing     Number of Occurrences: 20     Standing Expiration Date: 05/16/2015   All questions were answered. The patient knows to call the clinic with any problems, questions or concerns. No barriers to learning was detected. I spent 40 minutes counseling the patient face to face. The total time spent in the appointment was 55 minutes and more than 50% was on counseling and review of test results     [facility], [place], MD 05/15/2014 8:40 PM

## 2014-05-15 NOTE — Assessment & Plan Note (Signed)
I spent some time counseling the patient the importance of tobacco cessation. She is currently not interested to quit now.

## 2014-05-16 ENCOUNTER — Ambulatory Visit (HOSPITAL_BASED_OUTPATIENT_CLINIC_OR_DEPARTMENT_OTHER): Payer: Commercial Managed Care - HMO

## 2014-05-16 DIAGNOSIS — C3410 Malignant neoplasm of upper lobe, unspecified bronchus or lung: Secondary | ICD-10-CM

## 2014-05-16 DIAGNOSIS — C7931 Secondary malignant neoplasm of brain: Secondary | ICD-10-CM

## 2014-05-16 DIAGNOSIS — Z5111 Encounter for antineoplastic chemotherapy: Secondary | ICD-10-CM

## 2014-05-16 DIAGNOSIS — C3412 Malignant neoplasm of upper lobe, left bronchus or lung: Secondary | ICD-10-CM

## 2014-05-16 MED ORDER — SODIUM CHLORIDE 0.9 % IV SOLN
500.0000 mg/m2 | Freq: Once | INTRAVENOUS | Status: AC
  Administered 2014-05-16: 925 mg via INTRAVENOUS
  Filled 2014-05-16: qty 37

## 2014-05-16 MED ORDER — DEXAMETHASONE SODIUM PHOSPHATE 20 MG/5ML IJ SOLN
20.0000 mg | Freq: Once | INTRAMUSCULAR | Status: AC
  Administered 2014-05-16: 20 mg via INTRAVENOUS

## 2014-05-16 MED ORDER — CARBOPLATIN CHEMO INJECTION 600 MG/60ML
458.0000 mg | Freq: Once | INTRAVENOUS | Status: AC
  Administered 2014-05-16: 460 mg via INTRAVENOUS
  Filled 2014-05-16: qty 46

## 2014-05-16 MED ORDER — HEPARIN SOD (PORK) LOCK FLUSH 100 UNIT/ML IV SOLN
500.0000 [IU] | Freq: Once | INTRAVENOUS | Status: AC | PRN
  Administered 2014-05-16: 500 [IU]
  Filled 2014-05-16: qty 5

## 2014-05-16 MED ORDER — SODIUM CHLORIDE 0.9 % IJ SOLN
10.0000 mL | INTRAMUSCULAR | Status: DC | PRN
  Administered 2014-05-16: 10 mL
  Filled 2014-05-16: qty 10

## 2014-05-16 MED ORDER — ONDANSETRON 16 MG/50ML IVPB (CHCC)
INTRAVENOUS | Status: AC
  Filled 2014-05-16: qty 16

## 2014-05-16 MED ORDER — SODIUM CHLORIDE 0.9 % IV SOLN
Freq: Once | INTRAVENOUS | Status: AC
  Administered 2014-05-16: 10:00:00 via INTRAVENOUS

## 2014-05-16 MED ORDER — DEXAMETHASONE SODIUM PHOSPHATE 20 MG/5ML IJ SOLN
INTRAMUSCULAR | Status: AC
  Filled 2014-05-16: qty 5

## 2014-05-16 MED ORDER — ONDANSETRON 16 MG/50ML IVPB (CHCC)
16.0000 mg | Freq: Once | INTRAVENOUS | Status: AC
Start: 2014-05-16 — End: 2014-05-16
  Administered 2014-05-16: 16 mg via INTRAVENOUS

## 2014-05-16 NOTE — Patient Instructions (Signed)
[facility] Discharge Instructions for Patients Receiving Chemotherapy  Today you received the following chemotherapy agents Alimta/Carboplatin.  To help prevent nausea and vomiting after your treatment, we encourage you to take your nausea medication as prescribed.   If you develop nausea and vomiting that is not controlled by your nausea medication, call the clinic.   BELOW ARE SYMPTOMS THAT SHOULD BE REPORTED IMMEDIATELY:  *FEVER GREATER THAN 100.5 F  *CHILLS WITH OR WITHOUT FEVER  NAUSEA AND VOMITING THAT IS NOT CONTROLLED WITH YOUR NAUSEA MEDICATION  *UNUSUAL SHORTNESS OF BREATH  *UNUSUAL BRUISING OR BLEEDING  TENDERNESS IN MOUTH AND THROAT WITH OR WITHOUT PRESENCE OF ULCERS  *URINARY PROBLEMS  *BOWEL PROBLEMS  UNUSUAL RASH Items with * indicate a potential emergency and should be followed up as soon as possible.  Feel free to call the clinic you have any questions or concerns. The clinic phone number is [phone].

## 2014-05-17 ENCOUNTER — Telehealth: Payer: Self-pay | Admitting: *Deleted

## 2014-05-17 NOTE — Telephone Encounter (Signed)
Return call from [facility] well. Some difficulty sleeping last night only. Has no questions or concerns. Has her antiemetics if needed.

## 2014-05-17 NOTE — Telephone Encounter (Signed)
Left VM for patient to call back and ask for triage to f/u on status post chemo.

## 2014-05-30 ENCOUNTER — Telehealth: Payer: Self-pay | Admitting: *Deleted

## 2014-05-30 NOTE — Telephone Encounter (Signed)
Pt reports Eyes tearing and runny along w/ clear runny nose for past day. Denies any fever. Instructed pt to try OTC allergy remedy such as benadryl or claritan to help alleviate symptoms. Call us back if symptoms worsen.  Pt also reports she still has dark red/brown burn like areas where the tape was over her Porta cath and where the tape was on her arm at lab stick. Says her skin is not open but it looks like a burn. Denies any pain.  Instructed pt to let RN know before her PAC is accessed. Pt has already used Opsite for PAC. Suggested she may be allergic to chlorhexadine and we may need to use betadine instead. Instructed her to let RN know before her Shanda Howells is accessed next time. She verbalized understanding

## 2014-05-31 ENCOUNTER — Encounter (HOSPITAL_COMMUNITY): Payer: Self-pay | Admitting: Surgery

## 2014-06-06 ENCOUNTER — Ambulatory Visit (HOSPITAL_BASED_OUTPATIENT_CLINIC_OR_DEPARTMENT_OTHER): Payer: Commercial Managed Care - HMO | Admitting: Hematology and Oncology

## 2014-06-06 ENCOUNTER — Other Ambulatory Visit (HOSPITAL_BASED_OUTPATIENT_CLINIC_OR_DEPARTMENT_OTHER): Payer: Commercial Managed Care - HMO

## 2014-06-06 ENCOUNTER — Telehealth: Payer: Self-pay | Admitting: Hematology and Oncology

## 2014-06-06 ENCOUNTER — Ambulatory Visit (HOSPITAL_BASED_OUTPATIENT_CLINIC_OR_DEPARTMENT_OTHER): Payer: Commercial Managed Care - HMO

## 2014-06-06 ENCOUNTER — Encounter: Payer: Self-pay | Admitting: Hematology and Oncology

## 2014-06-06 VITALS — BP 137/71 | HR 80 | Temp 97.8°F | Resp 18 | Ht 64.0 in | Wt 173.4 lb

## 2014-06-06 DIAGNOSIS — C3412 Malignant neoplasm of upper lobe, left bronchus or lung: Secondary | ICD-10-CM

## 2014-06-06 DIAGNOSIS — C7931 Secondary malignant neoplasm of brain: Secondary | ICD-10-CM

## 2014-06-06 DIAGNOSIS — Z720 Tobacco use: Secondary | ICD-10-CM | POA: Diagnosis not present

## 2014-06-06 DIAGNOSIS — C3410 Malignant neoplasm of upper lobe, unspecified bronchus or lung: Secondary | ICD-10-CM

## 2014-06-06 DIAGNOSIS — Z5111 Encounter for antineoplastic chemotherapy: Secondary | ICD-10-CM

## 2014-06-06 DIAGNOSIS — F17200 Nicotine dependence, unspecified, uncomplicated: Secondary | ICD-10-CM

## 2014-06-06 DIAGNOSIS — H538 Other visual disturbances: Secondary | ICD-10-CM | POA: Diagnosis not present

## 2014-06-06 HISTORY — DX: Other visual disturbances: H53.8

## 2014-06-06 LAB — CBC WITH DIFFERENTIAL/PLATELET
BASO%: 0.5 % (ref 0.0–2.0)
Basophils Absolute: 0 10*3/uL (ref 0.0–0.1)
EOS ABS: 0.1 10*3/uL (ref 0.0–0.5)
EOS%: 1.1 % (ref 0.0–7.0)
HEMATOCRIT: 39 % (ref 34.8–46.6)
HGB: 11.9 g/dL (ref 11.6–15.9)
LYMPH#: 0.9 10*3/uL (ref 0.9–3.3)
LYMPH%: 18.4 % (ref 14.0–49.7)
MCH: 23 pg — ABNORMAL LOW (ref 25.1–34.0)
MCHC: 30.5 g/dL — AB (ref 31.5–36.0)
MCV: 75.4 fL — ABNORMAL LOW (ref 79.5–101.0)
MONO#: 0.3 10*3/uL (ref 0.1–0.9)
MONO%: 6.9 % (ref 0.0–14.0)
NEUT%: 73.1 % (ref 38.4–76.8)
NEUTROS ABS: 3.6 10*3/uL (ref 1.5–6.5)
PLATELETS: 302 10*3/uL (ref 145–400)
RBC: 5.17 10*6/uL (ref 3.70–5.45)
RDW: 17.5 % — ABNORMAL HIGH (ref 11.2–14.5)
WBC: 4.9 10*3/uL (ref 3.9–10.3)

## 2014-06-06 LAB — COMPREHENSIVE METABOLIC PANEL (CC13)
ALBUMIN: 2.9 g/dL — AB (ref 3.5–5.0)
ALK PHOS: 147 U/L (ref 40–150)
ALT: 8 U/L (ref 0–55)
AST: 9 U/L (ref 5–34)
Anion Gap: 11 mEq/L (ref 3–11)
BUN: 13.5 mg/dL (ref 7.0–26.0)
CO2: 24 mEq/L (ref 22–29)
CREATININE: 0.8 mg/dL (ref 0.6–1.1)
Calcium: 8.8 mg/dL (ref 8.4–10.4)
Chloride: 107 mEq/L (ref 98–109)
EGFR: 85 mL/min/{1.73_m2} — AB (ref >90)
GLUCOSE: 149 mg/dL — AB (ref 70–140)
POTASSIUM: 4.1 meq/L (ref 3.5–5.1)
Sodium: 142 mEq/L (ref 136–145)
Total Bilirubin: 0.21 mg/dL (ref 0.20–1.20)
Total Protein: 6.7 g/dL (ref 6.4–8.3)

## 2014-06-06 MED ORDER — DEXAMETHASONE SODIUM PHOSPHATE 20 MG/5ML IJ SOLN
INTRAMUSCULAR | Status: AC
  Filled 2014-06-06: qty 5

## 2014-06-06 MED ORDER — SODIUM CHLORIDE 0.9 % IV SOLN
Freq: Once | INTRAVENOUS | Status: AC
  Administered 2014-06-06: 11:00:00 via INTRAVENOUS

## 2014-06-06 MED ORDER — DEXAMETHASONE SODIUM PHOSPHATE 20 MG/5ML IJ SOLN
20.0000 mg | Freq: Once | INTRAMUSCULAR | Status: AC
  Administered 2014-06-06: 20 mg via INTRAVENOUS

## 2014-06-06 MED ORDER — SODIUM CHLORIDE 0.9 % IV SOLN
458.0000 mg | Freq: Once | INTRAVENOUS | Status: AC
  Administered 2014-06-06: 460 mg via INTRAVENOUS
  Filled 2014-06-06: qty 46

## 2014-06-06 MED ORDER — HEPARIN SOD (PORK) LOCK FLUSH 100 UNIT/ML IV SOLN
500.0000 [IU] | Freq: Once | INTRAVENOUS | Status: AC | PRN
  Administered 2014-06-06: 500 [IU]
  Filled 2014-06-06: qty 5

## 2014-06-06 MED ORDER — CIPROFLOXACIN HCL 0.3 % OP SOLN: 1.0000 [drp] | OPHTHALMIC | Status: DC

## 2014-06-06 MED ORDER — SODIUM CHLORIDE 0.9 % IV SOLN
490.0000 mg/m2 | Freq: Once | INTRAVENOUS | Status: AC
  Administered 2014-06-06: 900 mg via INTRAVENOUS
  Filled 2014-06-06: qty 36

## 2014-06-06 MED ORDER — ONDANSETRON 16 MG/50ML IVPB (CHCC)
16.0000 mg | Freq: Once | INTRAVENOUS | Status: AC
  Administered 2014-06-06: 16 mg via INTRAVENOUS

## 2014-06-06 MED ORDER — LOTEPREDNOL-TOBRAMYCIN 0.5-0.3 % OP SUSP
1.0000 [drp] | Freq: Four times a day (QID) | OPHTHALMIC | Status: DC
Start: 2014-06-06 — End: 2014-06-07

## 2014-06-06 MED ORDER — SODIUM CHLORIDE 0.9 % IJ SOLN
10.0000 mL | INTRAMUSCULAR | Status: DC | PRN
  Administered 2014-06-06: 10 mL
  Filled 2014-06-06: qty 10

## 2014-06-06 MED ORDER — ONDANSETRON 16 MG/50ML IVPB (CHCC)
INTRAVENOUS | Status: AC
  Filled 2014-06-06: qty 16

## 2014-06-06 NOTE — Telephone Encounter (Signed)
Pt confirmed labs/ov per 01/20 POF, gave pt AVS.... KJ, sent msg to add chemo, also verified labs/flush r/s to CT scan...Marland KitchenMarland Kitchen

## 2014-06-07 ENCOUNTER — Telehealth: Payer: Self-pay | Admitting: *Deleted

## 2014-06-07 ENCOUNTER — Other Ambulatory Visit: Payer: Self-pay | Admitting: *Deleted

## 2014-06-07 NOTE — Progress Notes (Signed)
[place] OFFICE PROGRESS NOTE  Patient Care Team: Katheren Shams, DO as PCP - General (Family Medicine) Gaye Pollack, MD (Cardiothoracic Surgery) Thea Silversmith, MD (Radiation Oncology) Dickie La, MD (Family Medicine) Heath Lark, MD as Consulting Physician (Hematology and Oncology)  SUMMARY OF ONCOLOGIC HISTORY: Oncology History   Colon cancer   Primary site: Colon and Rectum (Left)   Staging method: AJCC 7th Edition   Clinical: Stage I (T2, N0, M0) signed by Heath Lark, MD on 05/31/2013  2:39 PM   Pathologic: Stage I (T2, N0, cM0) signed by Heath Lark, MD on 05/31/2013  2:39 PM   Summary: Stage I (T2, N0, cM0) Lung cancer, EGFR/ALK negative, recurrence after initial resection to LN and brain   Primary site: Lung (Left)   Staging method: AJCC 7th Edition   Clinical: Stage IV (T1, N2, M1b) signed by Heath Lark, MD on 05/31/2013  2:26 PM   Pathologic: Stage IV (T1, N2, M1b) signed by Heath Lark, MD on 05/31/2013  2:26 PM   Summary: Stage IV (T1, N2, M1b)       Lung cancer   03/01/2007 Procedure Colonoscopy revealed abnormalities and biopsy show high-grade dysplasia   04/01/2007 Surgery She underwent sigmoid resection which showed T2 N0 colon cancer, and negative margins and all of 17 lymph nodes were negative   02/29/2008 Procedure Repeat surveillance colonoscopy was negative.   06/16/2010 Surgery She underwent left upper lobectomy we show well-differentiated adenocarcinoma of the lung, T1, N0, M0   03/18/2011 Procedure Repeat colonoscopy show multiple polyps but there were benign   10/19/2011 Procedure Biopsy of mediastinal lymph node came back positive for recurrence of lung cancer, EGFR and ALK negative   11/16/2011 - 12/14/2011 Chemotherapy She received concurrent chemoradiation therapy with weekly carboplatin and Taxol.   11/16/2011 - 12/29/2011 Radiation Therapy She received radiation therapy with weekly chemotherapy   02/15/2012 Imaging MR of the brain showed a new  intracranial metastases. This was subsequently treated with stereotactic radiosurgery.   03/07/2012 - 04/12/2013 Chemotherapy She received chemotherapy with maintainence Alimta every 3 weeks. Chemotherapy was discontinued due to profound fatigue   03/02/2013 Procedure She had therapeutic ultrasound guidance thoracentesis for pleural effusion that came back negative for cancer   05/04/2013 Procedure She had repeat ultrasound-guided thoracentesis again and cytology was negative   05/29/2013 Imaging Repeat CT scan of the chest, abdomen and pelvis show no evidence of disease but persistent right-sided pleural effusion   06/02/2013 Surgery The patient had placement of Pleurx catheter and subsequently underwent pleurodesis.   09/22/2013 Imaging Repeat CT scan show no evidence of active disease. There are nonspecific lymphadenopathy and she is placed on observation.   03/23/2014 Imaging Repeat CT scan of the chest, abdomen and pelvis show recurrence of cancer with widespread bilateral pulmonary metastasis.   05/14/2014 Imaging Imaging of the chest and brain were repeated due to delay of initiation of chemotherapy. Overall, chest CT scan show stable disease. MRI of the head was negative for recurrence    Brain metastases treated with radiosurgery   02/26/2012 Initial Diagnosis Brain metastases treated with radiosurgery    INTERVAL HISTORY: Please see below for problem oriented charting. She returns prior to cycle 2 of treatment. She denies side effects. She complained of watery eyes with occasional blurry vision.  REVIEW OF SYSTEMS:   Constitutional: Denies fevers, chills or abnormal weight loss Ears, nose, mouth, throat, and face: Denies mucositis or sore throat Respiratory: Denies cough, dyspnea or wheezes Cardiovascular: Denies  palpitation, chest discomfort or lower extremity swelling Gastrointestinal:  Denies nausea, heartburn or change in bowel habits Skin: Denies abnormal skin rashes Lymphatics:  Denies new lymphadenopathy or easy bruising Neurological:Denies numbness, tingling or new weaknesses Behavioral/Psych: Mood is stable, no new changes  All other systems were reviewed with the patient and are negative.  I have reviewed the past medical history, past surgical history, social history and family history with the patient and they are unchanged from previous note.  ALLERGIES:  is allergic to adhesive and lisinopril.  MEDICATIONS:  Current Outpatient Prescriptions  Medication Sig Dispense Refill  . albuterol (PROVENTIL HFA;VENTOLIN HFA) 108 (90 BASE) MCG/ACT inhaler Inhale 1 puff into the lungs 2 (two) times daily. 6.7 g prn  . aspirin 81 MG chewable tablet Chew 81 mg by mouth daily.    . diclofenac sodium (VOLTAREN) 1 % GEL Apply 2 g topically 4 (four) times daily. As needed for pain 100 g 11  . FLUoxetine (PROZAC) 20 MG tablet Take 1 tablet (20 mg total) by mouth daily. 90 tablet 3  . folic acid (FOLVITE) 1 MG tablet Take 1 tablet (1 mg total) by mouth daily. 100 tablet 3  . furosemide (LASIX) 20 MG tablet Take 1 tablet (20 mg total) by mouth daily. 90 tablet 3  . lidocaine-prilocaine (EMLA) cream Apply topically as needed. Apply to porta cath site one hour prior to needle stick. 30 g prn  . mirtazapine (REMERON) 15 MG tablet Take 1 tablet (15 mg total) by mouth at bedtime. 90 tablet 0  . nebivolol (BYSTOLIC) 10 MG tablet Take 1 tablet (10 mg total) by mouth daily. 90 tablet 4  . nitroGLYCERIN (NITROSTAT) 0.4 MG SL tablet Place 1 tablet (0.4 mg total) under the tongue every 5 (five) minutes as needed. For chest pain 10 tablet 0  . omeprazole (PRILOSEC) 40 MG capsule Take 1 capsule (40 mg total) by mouth daily. 90 capsule 3  . ondansetron (ZOFRAN) 8 MG tablet Take 1 tablet (8 mg total) by mouth every 8 (eight) hours as needed. 30 tablet 1  . potassium chloride SA (K-DUR,KLOR-CON) 20 MEQ tablet Take 1 tablet (20 mEq total) by mouth 2 (two) times daily. 180 tablet 0  . pravastatin  (PRAVACHOL) 40 MG tablet TAKE 1 TABLET BY MOUTH ONCE DAILY 90 tablet 3  . prochlorperazine (COMPAZINE) 10 MG tablet Take 1 tablet (10 mg total) by mouth every 6 (six) hours as needed (Nausea or vomiting). 30 tablet 1  . tiZANidine (ZANAFLEX) 2 MG tablet Take 1 tablet (2 mg total) by mouth every 6 (six) hours as needed (Back Muscle Spasm). 30 tablet 3  . traMADol (ULTRAM) 50 MG tablet Take 1 tablet (50 mg total) by mouth every 6 (six) hours as needed. 60 tablet 2  . ciprofloxacin (CILOXAN) 0.3 % ophthalmic solution Place 1 drop into both eyes every 4 (four) hours while awake. 5 mL 0  . Loteprednol-Tobramycin 0.5-0.3 % SUSP Place 1 drop into both eyes 4 (four) times daily. 5 mL 0  . [DISCONTINUED] buPROPion (WELLBUTRIN XL) 150 MG 24 hr tablet Take 1 tablet (150 mg total) by mouth 2 (two) times daily. 60 tablet 5   No current facility-administered medications for this visit.    PHYSICAL EXAMINATION: ECOG PERFORMANCE STATUS: 0 - Asymptomatic  Filed Vitals:   06/06/14 0942  BP: 137/71  Pulse: 80  Temp: 97.8 F (36.6 C)  Resp: 18   Filed Weights   06/06/14 0942  Weight: 173 lb 6.4 oz (78.654  kg)    GENERAL:alert, no distress and comfortable SKIN: skin color, texture, turgor are normal, no rashes or significant lesions EYES: normal, Conjunctiva are pink and non-injected, sclera clear OROPHARYNX:no exudate, no erythema and lips, buccal mucosa, and tongue normal  NECK: supple, thyroid normal size, non-tender, without nodularity LYMPH:  no palpable lymphadenopathy in the cervical, axillary or inguinal LUNGS: clear to auscultation and percussion with normal breathing effort HEART: regular rate & rhythm and no murmurs and no lower extremity edema ABDOMEN:abdomen soft, non-tender and normal bowel sounds Musculoskeletal:no cyanosis of digits and no clubbing  NEURO: alert & oriented x 3 with fluent speech, no focal motor/sensory deficits  LABORATORY DATA:  I have reviewed the data as  listed    Component Value Date/Time   NA 142 06/06/2014 0904   NA 141 12/26/2013 0946   NA 143 09/28/2011 0833   K 4.1 06/06/2014 0904   K 4.0 12/26/2013 0946   K 3.5 09/28/2011 0833   CL 106 12/26/2013 0946   CL 102 10/24/2012 1001   CL 98 09/28/2011 0833   CO2 24 06/06/2014 0904   CO2 27 12/26/2013 0946   CO2 30 09/28/2011 0833   GLUCOSE 149* 06/06/2014 0904   GLUCOSE 101* 12/26/2013 0946   GLUCOSE 111* 10/24/2012 1001   GLUCOSE 112 09/28/2011 0833   BUN 13.5 06/06/2014 0904   BUN 9 12/26/2013 0946   BUN 9 09/28/2011 0833   CREATININE 0.8 06/06/2014 0904   CREATININE 0.83 12/26/2013 0946   CREATININE 0.7 09/28/2011 0833   CALCIUM 8.8 06/06/2014 0904   CALCIUM 8.8 12/26/2013 0946   CALCIUM 8.7 09/28/2011 0833   PROT 6.7 06/06/2014 0904   PROT 6.8 12/26/2013 0946   PROT 7.2 09/28/2011 0833   ALBUMIN 2.9* 06/06/2014 0904   ALBUMIN 3.8 12/26/2013 0946   AST 9 06/06/2014 0904   AST 12 12/26/2013 0946   AST 15 09/28/2011 0833   ALT 8 06/06/2014 0904   ALT <8 12/26/2013 0946   ALT 16 09/28/2011 0833   ALKPHOS 147 06/06/2014 0904   ALKPHOS 148* 12/26/2013 0946   ALKPHOS 99* 09/28/2011 0833   BILITOT 0.21 06/06/2014 0904   BILITOT 0.6 12/26/2013 0946   BILITOT 0.80 09/28/2011 0833   GFRNONAA 74* 06/12/2013 0625   GFRAA 86* 06/12/2013 0625    No results found for: SPEP, UPEP  Lab Results  Component Value Date   WBC 4.9 06/06/2014   NEUTROABS 3.6 06/06/2014   HGB 11.9 06/06/2014   HCT 39.0 06/06/2014   MCV 75.4* 06/06/2014   PLT 302 06/06/2014      Chemistry      Component Value Date/Time   NA 142 06/06/2014 0904   NA 141 12/26/2013 0946   NA 143 09/28/2011 0833   K 4.1 06/06/2014 0904   K 4.0 12/26/2013 0946   K 3.5 09/28/2011 0833   CL 106 12/26/2013 0946   CL 102 10/24/2012 1001   CL 98 09/28/2011 0833   CO2 24 06/06/2014 0904   CO2 27 12/26/2013 0946   CO2 30 09/28/2011 0833   BUN 13.5 06/06/2014 0904   BUN 9 12/26/2013 0946   BUN 9 09/28/2011  0833   CREATININE 0.8 06/06/2014 0904   CREATININE 0.83 12/26/2013 0946   CREATININE 0.7 09/28/2011 0833      Component Value Date/Time   CALCIUM 8.8 06/06/2014 0904   CALCIUM 8.8 12/26/2013 0946   CALCIUM 8.7 09/28/2011 0833   ALKPHOS 147 06/06/2014 0904   ALKPHOS 148* 12/26/2013  0946   ALKPHOS 99* 09/28/2011 0833   AST 9 06/06/2014 0904   AST 12 12/26/2013 0946   AST 15 09/28/2011 0833   ALT 8 06/06/2014 0904   ALT <8 12/26/2013 0946   ALT 16 09/28/2011 0833   BILITOT 0.21 06/06/2014 0904   BILITOT 0.6 12/26/2013 0946   BILITOT 0.80 09/28/2011 0833     ASSESSMENT & PLAN:  Lung cancer She tolerated cycle 1 well without any side effects. She will proceed with cycle 2 without dose adjustment. The patient is aware I will not be here to see her on cycle 3 and offered to scheduler her to be seen by my assistant but she declined. If her blood work met parameter for cycle 3 she will proceed. I will order imaging study prior to cycle 4 to assess response to treatment.   TOBACCO ABUSE I spent some time counseling the patient the importance of tobacco cessation. She is currently not interested to quit now.   Blurred vision, bilateral I recommend a trial of topical antibiotic eyedrops. If her symptoms do not improve by the end of the week, I recommend she makes an appointment to see an ophthalmologist    Orders Placed This Encounter  Procedures  . CT Chest W Contrast    Standing Status: Future     Number of Occurrences:      Standing Expiration Date: 08/06/2015    Order Specific Question:  Reason for Exam (SYMPTOM  OR DIAGNOSIS REQUIRED)    Answer:  staging lung ca assess response to Rx    Order Specific Question:  Preferred imaging location?    Answer:  [facility]  . CT Abdomen Pelvis W Contrast    Standing Status: Future     Number of Occurrences:      Standing Expiration Date: 09/06/2015    Order Specific Question:  Reason for Exam (SYMPTOM  OR DIAGNOSIS  REQUIRED)    Answer:  lung cancer staging    Order Specific Question:  Preferred imaging location?    Answer:  [facility]   All questions were answered. The patient knows to call the clinic with any problems, questions or concerns. No barriers to learning was detected. I spent 25 minutes counseling the patient face to face. The total time spent in the appointment was 30 minutes and more than 50% was on counseling and review of test results     [facility], [place], MD 06/07/2014 8:30 AM

## 2014-06-07 NOTE — Assessment & Plan Note (Signed)
She tolerated cycle 1 well without any side effects. She will proceed with cycle 2 without dose adjustment. The patient is aware I will not be here to see her on cycle 3 and offered to scheduler her to be seen by my assistant but she declined. If her blood work met parameter for cycle 3 she will proceed. I will order imaging study prior to cycle 4 to assess response to treatment.

## 2014-06-07 NOTE — Telephone Encounter (Signed)
Pt called requesting fax number to Dr. Alvy Bimler.  Spoke with pt and was informed that pt needed fax number for her insurance to send forms for Dr. Alvy Bimler to fill out and sign.  Gave pt fax number to HIM and to write attention to [facility], care management. Pt's  Phone    [phone].

## 2014-06-07 NOTE — Assessment & Plan Note (Signed)
I recommend a trial of topical antibiotic eyedrops. If her symptoms do not improve by the end of the week, I recommend she makes an appointment to see an ophthalmologist

## 2014-06-07 NOTE — Assessment & Plan Note (Signed)
I spent some time counseling the patient the importance of tobacco cessation. She is currently not interested to quit now.

## 2014-06-08 ENCOUNTER — Encounter: Payer: Self-pay | Admitting: Hematology and Oncology

## 2014-06-08 NOTE — Progress Notes (Signed)
Pt is approved with [employer] for Alimta and Carboplatin from 06/05/14 to 06/05/15 or when the benefit cap has been met.  Expenses can be submitted for reimbursement for dos 03/07/14 to 06/05/15.  The amount of the grant is $10,000.  Will forward copy of letter to [facility] in billing.

## 2014-06-14 ENCOUNTER — Encounter: Payer: Self-pay | Admitting: Hematology and Oncology

## 2014-06-14 NOTE — Progress Notes (Signed)
Pt received assistance from the [facility] from 05/08/14 to 05/08/15 for $6,000.  I will forward copy of letter to [employer] in billing.

## 2014-06-16 ENCOUNTER — Emergency Department (HOSPITAL_COMMUNITY)
Admission: EM | Admit: 2014-06-16 | Discharge: 2014-06-16 | Disposition: A | Discharge status: Home / Self Care | Payer: Commercial Managed Care - HMO | Attending: Emergency Medicine | Admitting: Emergency Medicine

## 2014-06-16 ENCOUNTER — Encounter (HOSPITAL_COMMUNITY): Payer: Self-pay | Admitting: Emergency Medicine

## 2014-06-16 DIAGNOSIS — Z9889 Other specified postprocedural states: Secondary | ICD-10-CM | POA: Insufficient documentation

## 2014-06-16 DIAGNOSIS — Z85841 Personal history of malignant neoplasm of brain: Secondary | ICD-10-CM | POA: Insufficient documentation

## 2014-06-16 DIAGNOSIS — Z79899 Other long term (current) drug therapy: Secondary | ICD-10-CM | POA: Diagnosis not present

## 2014-06-16 DIAGNOSIS — Z780 Asymptomatic menopausal state: Secondary | ICD-10-CM | POA: Insufficient documentation

## 2014-06-16 DIAGNOSIS — E780 Pure hypercholesterolemia: Secondary | ICD-10-CM | POA: Insufficient documentation

## 2014-06-16 DIAGNOSIS — M1990 Unspecified osteoarthritis, unspecified site: Secondary | ICD-10-CM | POA: Diagnosis not present

## 2014-06-16 DIAGNOSIS — E785 Hyperlipidemia, unspecified: Secondary | ICD-10-CM | POA: Insufficient documentation

## 2014-06-16 DIAGNOSIS — Z85038 Personal history of other malignant neoplasm of large intestine: Secondary | ICD-10-CM | POA: Diagnosis not present

## 2014-06-16 DIAGNOSIS — F329 Major depressive disorder, single episode, unspecified: Secondary | ICD-10-CM | POA: Insufficient documentation

## 2014-06-16 DIAGNOSIS — Z720 Tobacco use: Secondary | ICD-10-CM | POA: Diagnosis not present

## 2014-06-16 DIAGNOSIS — M5432 Sciatica, left side: Secondary | ICD-10-CM | POA: Diagnosis not present

## 2014-06-16 DIAGNOSIS — Z8669 Personal history of other diseases of the nervous system and sense organs: Secondary | ICD-10-CM | POA: Insufficient documentation

## 2014-06-16 DIAGNOSIS — M79672 Pain in left foot: Secondary | ICD-10-CM | POA: Insufficient documentation

## 2014-06-16 DIAGNOSIS — J449 Chronic obstructive pulmonary disease, unspecified: Secondary | ICD-10-CM | POA: Diagnosis not present

## 2014-06-16 DIAGNOSIS — I10 Essential (primary) hypertension: Secondary | ICD-10-CM | POA: Insufficient documentation

## 2014-06-16 DIAGNOSIS — Z872 Personal history of diseases of the skin and subcutaneous tissue: Secondary | ICD-10-CM | POA: Diagnosis not present

## 2014-06-16 DIAGNOSIS — K219 Gastro-esophageal reflux disease without esophagitis: Secondary | ICD-10-CM | POA: Insufficient documentation

## 2014-06-16 DIAGNOSIS — Z8742 Personal history of other diseases of the female genital tract: Secondary | ICD-10-CM | POA: Insufficient documentation

## 2014-06-16 DIAGNOSIS — I2510 Atherosclerotic heart disease of native coronary artery without angina pectoris: Secondary | ICD-10-CM | POA: Insufficient documentation

## 2014-06-16 DIAGNOSIS — Z85118 Personal history of other malignant neoplasm of bronchus and lung: Secondary | ICD-10-CM | POA: Diagnosis not present

## 2014-06-16 DIAGNOSIS — Z7982 Long term (current) use of aspirin: Secondary | ICD-10-CM | POA: Diagnosis not present

## 2014-06-16 DIAGNOSIS — M545 Low back pain: Secondary | ICD-10-CM | POA: Diagnosis present

## 2014-06-16 MED ORDER — OXYCODONE HCL 5 MG PO TABS: 5.0000 mg | ORAL_TABLET | ORAL | Status: DC | PRN

## 2014-06-16 MED ORDER — DIAZEPAM 5 MG PO TABS
5.0000 mg | ORAL_TABLET | Freq: Three times a day (TID) | ORAL | Status: DC | PRN
Start: 2014-06-16 — End: 2014-07-18

## 2014-06-16 NOTE — ED Notes (Signed)
C/o pain that radiates from L heel to lower back.  History of same.  Took Tramadol and muscle relaxer tonight without relief.

## 2014-06-16 NOTE — Discharge Instructions (Signed)
Sciatica °Sciatica is pain, weakness, numbness, or tingling along the path of the sciatic nerve. The nerve starts in the lower back and runs down the back of each leg. The nerve controls the muscles in the lower leg and in the back of the knee, while also providing sensation to the back of the thigh, lower leg, and the sole of your foot. Sciatica is a symptom of another medical condition. For instance, nerve damage or certain conditions, such as a herniated disk or bone spur on the spine, pinch or put pressure on the sciatic nerve. This causes the pain, weakness, or other sensations normally associated with sciatica. Generally, sciatica only affects one side of the body. °CAUSES  °· Herniated or slipped disc. °· Degenerative disk disease. °· A pain disorder involving the narrow muscle in the buttocks (piriformis syndrome). °· Pelvic injury or fracture. °· Pregnancy. °· Tumor (rare). °SYMPTOMS  °Symptoms can vary from mild to very severe. The symptoms usually travel from the low back to the buttocks and down the back of the leg. Symptoms can include: °· Mild tingling or dull aches in the lower back, leg, or hip. °· Numbness in the back of the calf or sole of the foot. °· Burning sensations in the lower back, leg, or hip. °· Sharp pains in the lower back, leg, or hip. °· Leg weakness. °· Severe back pain inhibiting movement. °These symptoms may get worse with coughing, sneezing, laughing, or prolonged sitting or standing. Also, being overweight may worsen symptoms. °DIAGNOSIS  °Your caregiver will perform a physical exam to look for common symptoms of sciatica. He or she may ask you to do certain movements or activities that would trigger sciatic nerve pain. Other tests may be performed to find the cause of the sciatica. These may include: °· Blood tests. °· X-rays. °· Imaging tests, such as an MRI or CT scan. °TREATMENT  °Treatment is directed at the cause of the sciatic pain. Sometimes, treatment is not necessary  and the pain and discomfort goes away on its own. If treatment is needed, your caregiver may suggest: °· Over-the-counter medicines to relieve pain. °· Prescription medicines, such as anti-inflammatory medicine, muscle relaxants, or narcotics. °· Applying heat or ice to the painful area. °· Steroid injections to lessen pain, irritation, and inflammation around the nerve. °· Reducing activity during periods of pain. °· Exercising and stretching to strengthen your abdomen and improve flexibility of your spine. Your caregiver may suggest losing weight if the extra weight makes the back pain worse. °· Physical therapy. °· Surgery to eliminate what is pressing or pinching the nerve, such as a bone spur or part of a herniated disk. °HOME CARE INSTRUCTIONS  °· Only take over-the-counter or prescription medicines for pain or discomfort as directed by your caregiver. °· Apply ice to the affected area for 20 minutes, 3-4 times a day for the first 48-72 hours. Then try heat in the same way. °· Exercise, stretch, or perform your usual activities if these do not aggravate your pain. °· Attend physical therapy sessions as directed by your caregiver. °· Keep all follow-up appointments as directed by your caregiver. °· Do not wear high heels or shoes that do not provide proper support. °· Check your mattress to see if it is too soft. A firm mattress may lessen your pain and discomfort. °SEEK IMMEDIATE MEDICAL CARE IF:  °· You lose control of your bowel or bladder (incontinence). °· You have increasing weakness in the lower back, pelvis, buttocks,   or legs.  You have redness or swelling of your back.  You have a burning sensation when you urinate.  You have pain that gets worse when you lie down or awakens you at night.  Your pain is worse than you have experienced in the past.  Your pain is lasting longer than 4 weeks.  You are suddenly losing weight without reason. MAKE SURE YOU:  Understand these  instructions.  Will watch your condition.  Will get help right away if you are not doing well or get worse. Document Released: 04/28/2001 Document Revised: 11/03/2011 Document Reviewed: 09/13/2011 [facility] Patient Information 2015 [place], [place]. This information is not intended to replace advice given to you by your health care provider. Make sure you discuss any questions you have with your health care provider.  Diazepam tablets What is this medicine? DIAZEPAM (dye AZ e pam) is a benzodiazepine. It is used to treat anxiety and nervousness. It also can help treat alcohol withdrawal, relax muscles, and treat certain types of seizures. This medicine may be used for other purposes; ask your health care provider or pharmacist if you have questions. COMMON BRAND NAME(S): Valium What should I tell my health care provider before I take this medicine? They need to know if you have any of these conditions -an alcohol or drug abuse problem -bipolar disorder, depression, psychosis or other mental health condition -glaucoma -kidney or liver disease -lung or breathing disease -myasthenia gravis -Parkinson's disease -seizures or a history of seizures -suicidal thoughts -an unusual or allergic reaction to diazepam, other benzodiazepines, foods, dyes, or preservatives -pregnant or trying to get pregnant -breast-feeding How should I use this medicine? Take this medicine by mouth with a glass of water. Follow the directions on the prescription label. If this medicine upsets your stomach, take it with food or milk. Take your doses at regular intervals. Do not take your medicine more often than directed. If you have been taking this medicine regularly for some time, do not suddenly stop taking it. You must gradually reduce the dose or you may get severe side effects. Ask your doctor or health care professional for advice. Even after you stop taking this medicine it can still affect your body for several  days. Talk to your pediatrician regarding the use of this medicine in children. Special care may be needed. Overdosage: If you think you have taken too much of this medicine contact a poison control center or emergency room at once. NOTE: This medicine is only for you. Do not share this medicine with others. What if I miss a dose? If you miss a dose, take it as soon as you can. If it is almost time for your next dose, take only that dose. Do not take double or extra doses. What may interact with this medicine? -cimetidine -grapefruit juice -herbal or dietary supplements like kava kava, melatonin, St. John's Wort, or valerian -medicines for anxiety or sleeping problems, like alprazolam, lorazepam, or triazolam -medicines for depression, mental problems or psychiatric disturbances -medicines for HIV infection or AIDS -prescription pain medicines -rifampin, rifapentine, or rifabutin -some medicines for seizures like carbamazepine, phenobarbital, phenytoin, or primidone This list may not describe all possible interactions. Give your health care provider a list of all the medicines, herbs, non-prescription drugs, or dietary supplements you use. Also tell them if you smoke, drink alcohol, or use illegal drugs. Some items may interact with your medicine. What should I watch for while using this medicine? Visit your doctor or health care  professional for regular checks on your progress. Your body can become dependent on this medicine. Ask your doctor or health care professional if you still need to take it. You may get drowsy or dizzy. Do not drive, use machinery, or do anything that needs mental alertness until you know how this medicine affects you. To reduce the risk of dizzy and fainting spells, do not stand or sit up quickly, especially if you are an older patient. Alcohol may increase dizziness and drowsiness. Avoid alcoholic drinks. Do not treat yourself for coughs, colds or allergies without  asking your doctor or health care professional for advice. Some ingredients can increase possible side effects. What side effects may I notice from receiving this medicine? Side effects that you should report to your doctor or health care professional as soon as possible: -allergic reactions like skin rash, itching or hives, swelling of the face, lips, or tongue -angry, confused, depressed, other mood changes -breathing problems -feeling faint or lightheaded, falls -muscle cramps -problems with balance, talking, walking -restlessness -tremors -trouble passing urine or change in the amount of urine -unusually weak or tired Side effects that usually do not require medical attention (report to your doctor or health care professional if they continue or are bothersome): -difficulty sleeping, nightmares -dizziness, drowsiness, clumsiness, or unsteadiness, a hangover effect -headache -nausea, vomiting This list may not describe all possible side effects. Call your doctor for medical advice about side effects. You may report side effects to FDA at 1-800-FDA-1088. Where should I keep my medicine? Keep out of the reach of children. This medicine can be abused. Keep your medicine in a safe place to protect it from theft. Do not share this medicine with anyone. Selling or giving away this medicine is dangerous and against the law. Store at room temperature between 15 and 30 degrees C (59 and 86 degrees F). Protect from light. Keep container tightly closed. Throw away any unused medicine after the expiration date. NOTE: This sheet is a summary. It may not cover all possible information. If you have questions about this medicine, talk to your doctor, pharmacist, or health care provider.  2015, [employer]. (2007-08-22 16:57:35)  Oxycodone tablets or capsules What is this medicine? OXYCODONE (ox i KOE done) is a pain reliever. It is used to treat moderate to severe pain. This medicine may  be used for other purposes; ask your health care provider or pharmacist if you have questions. COMMON BRAND NAME(S): Dazidox, Endocodone, OXECTA, OxyIR, Percolone, Roxicodone What should I tell my health care provider before I take this medicine? They need to know if you have any of these conditions: -Addison's disease -brain tumor -drug abuse or addiction -head injury -heart disease -if you frequently drink alcohol containing drinks -kidney disease or problems going to the bathroom -liver disease -lung disease, asthma, or breathing problems -mental problems -an unusual or allergic reaction to oxycodone, codeine, hydrocodone, morphine, other medicines, foods, dyes, or preservatives -pregnant or trying to get pregnant -breast-feeding How should I use this medicine? Take this medicine by mouth with a glass of water. Follow the directions on the prescription label. You can take it with or without food. If it upsets your stomach, take it with food. Take your medicine at regular intervals. Do not take it more often than directed. Do not stop taking except on your doctor's advice. Some brands of this medicine, like Oxecta, have special instructions. Ask your doctor or pharmacist if these directions are for you: Do not cut, crush or  chew this medicine. Swallow only one tablet at a time. Do not wet, soak, or lick the tablet before you take it. Talk to your pediatrician regarding the use of this medicine in children. Special care may be needed. Overdosage: If you think you have taken too much of this medicine contact a poison control center or emergency room at once. NOTE: This medicine is only for you. Do not share this medicine with others. What if I miss a dose? If you miss a dose, take it as soon as you can. If it is almost time for your next dose, take only that dose. Do not take double or extra doses. What may interact with this medicine? -alcohol -antihistamines -certain medicines used for  nausea like chlorpromazine, droperidol -erythromycin -ketoconazole -medicines for depression, anxiety, or psychotic disturbances -medicines for sleep -muscle relaxants -naloxone -naltrexone -narcotic medicines (opiates) for pain -nilotinib -phenobarbital -phenytoin -rifampin -ritonavir -voriconazole This list may not describe all possible interactions. Give your health care provider a list of all the medicines, herbs, non-prescription drugs, or dietary supplements you use. Also tell them if you smoke, drink alcohol, or use illegal drugs. Some items may interact with your medicine. What should I watch for while using this medicine? Tell your doctor or health care professional if your pain does not go away, if it gets worse, or if you have new or a different type of pain. You may develop tolerance to the medicine. Tolerance means that you will need a higher dose of the medicine for pain relief. Tolerance is normal and is expected if you take this medicine for a long time. Do not suddenly stop taking your medicine because you may develop a severe reaction. Your body becomes used to the medicine. This does NOT mean you are addicted. Addiction is a behavior related to getting and using a drug for a non-medical reason. If you have pain, you have a medical reason to take pain medicine. Your doctor will tell you how much medicine to take. If your doctor wants you to stop the medicine, the dose will be slowly lowered over time to avoid any side effects. You may get drowsy or dizzy when you first start taking this medicine or change doses. Do not drive, use machinery, or do anything that may be dangerous until you know how the medicine affects you. Stand or sit up slowly. There are different types of narcotic medicines (opiates) for pain. If you take more than one type at the same time, you may have more side effects. Give your health care provider a list of all medicines you use. Your doctor will tell you  how much medicine to take. Do not take more medicine than directed. Call emergency for help if you have problems breathing. This medicine will cause constipation. Try to have a bowel movement at least every 2 to 3 days. If you do not have a bowel movement for 3 days, call your doctor or health care professional. Your mouth may get dry. Drinking water, chewing sugarless gum, or sucking on hard candy may help. See your dentist every 6 months. What side effects may I notice from receiving this medicine? Side effects that you should report to your doctor or health care professional as soon as possible: -allergic reactions like skin rash, itching or hives, swelling of the face, lips, or tongue -breathing problems -confusion -feeling faint or lightheaded, falls -trouble passing urine or change in the amount of urine -unusually weak or tired Side effects that usually  do not require medical attention (report to your doctor or health care professional if they continue or are bothersome): -constipation -dry mouth -itching -nausea, vomiting -upset stomach This list may not describe all possible side effects. Call your doctor for medical advice about side effects. You may report side effects to FDA at 1-800-FDA-1088. Where should I keep my medicine? Keep out of the reach of children. This medicine can be abused. Keep your medicine in a safe place to protect it from theft. Do not share this medicine with anyone. Selling or giving away this medicine is dangerous and against the law. Store at room temperature between 15 and 30 degrees C (59 and 86 degrees F). Protect from light. Keep container tightly closed. This medicine may cause accidental overdose and death if it is taken by other adults, children, or pets. Flush any unused medicine down the toilet to reduce the chance of harm. Do not use the medicine after the expiration date. NOTE: This sheet is a summary. It may not cover all possible information. If  you have questions about this medicine, talk to your doctor, pharmacist, or health care provider.  2015, [employer]. (2013-01-12 13:43:33)

## 2014-06-16 NOTE — ED Notes (Signed)
Patient is alert and orientedx4.  Patient was explained discharge instructions and they understood them with no questions.   

## 2014-06-16 NOTE — ED Provider Notes (Signed)
CSN: [id number]     Arrival date & time 06/16/14  0234 History  This chart was scribed for Delora Fuel, MD by Delphia Grates, ED Scribe. This patient was seen in room A10C/A10C and the patient's care was started at 3:02 AM.     Chief Complaint  Patient presents with  . Foot Pain  . Back Pain    The history is provided by the patient. No language interpreter was used.     HPI Comments: Lauren [name] is a 64 y.o. female who presents to the Emergency Department complaining of worsening sharp, 10/10, left foot pain that radiates from left heel to the left thigh, left hip, and lower back with onset 7 hours ago. Patient states she has been unable to lay down or sleep due to the severe pain. Patient reports history of the same as the result of a chipped disc and surgical repair. Patient has tried Tramadol, Zanaflex, and Voltaren gel without significant improvement. She denies fever, chills, bowel/baldder incontinence, or any other pain. Patient has history of CA, HTN, HLD, and cerebral aneurysm.   Past Medical History  Diagnosis Date  . Depression   . Hyperlipidemia   . Hypertension   . Lung nodule     FNA ordered for 04/02/10 by HA>pos Ca  . Chronic folliculitis     of groin  . Fibrocystic breast changes   . Family history of trichomonal vaginitis 05/2005  . Postmenopausal   . Depressive disorder   . Hypercholesterolemia   . GERD (gastroesophageal reflux disease)   . Cerebral aneurysm   . Back pain   . Shortness of breath   . COPD (chronic obstructive pulmonary disease)   . Coronary artery disease   . Arthritis   . Weight loss 10/22/2011  . Status post radiation therapy 11/16/11 - 12/29/11    Right Lung and Mediastinum: 60 Gy  . Status post chemotherapy comp. 12/29/11    Carboplatin/Taxol  . S/P radiation therapy 03/01/12    SRS: 1 fraction / 20 Gray each to the Left Occipital Region and to the Right Insular Metastases  . On antineoplastic chemotherapy started 02/2012     Alimta  . Fatigue 02/06/2013  . Pleural effusion 05/03/2013  . Colon cancer 11/08  . Lung cancer 06/16/10    PET scan 04/28/2010; primary: increase in size 02/2010 / Well Differentiated Adenocarcinoma of the lung   . Brain metastases 02/15/12  . Blurred vision, bilateral 06/06/2014   Past Surgical History  Procedure Laterality Date  . Colectomy  03/22/07    Stage 1 pT2 N0, M0 Adenocarcinoma of the sigmoid  colon  . Tubal ligation    . Lung lobectomy  06/16/10    Left Upper Lobectomy  . Hernia repair    . Breast surgery      Bil lumpectomy  . Back surgery    . Mediastinoscopy  10/19/2011    Procedure: MEDIASTINOSCOPY;  Surgeon: Gaye Pollack, MD;  Location: [facility] OR;  Service: Thoracic;  Laterality: N/A;  . Cardiac cath x3    . Chest tube insertion Right 06/12/2013    Procedure: INSERTION PLEURAL DRAINAGE CATHETER;  Surgeon: Gaye Pollack, MD;  Location: [facility];  Service: Thoracic;  Laterality: Right;  . Talc pleurodesis Right 06/12/2013    Procedure: Pietro Cassis;  Surgeon: Gaye Pollack, MD;  Location: [facility] OR;  Service: Thoracic;  Laterality: Right;   Family History  Problem Relation Age of Onset  . Stomach cancer Maternal Aunt   .  Breast cancer Cousin   . Cancer Sister     Lymphatic  . Osteoarthritis Father   . Gout Father   . Hypertension Father   . Heart disease Mother     pericarditis;   . Anesthesia problems Neg Hx    History  Substance Use Topics  . Smoking status: Current Every Day Smoker -- 1.00 packs/day    Types: Cigarettes  . Smokeless tobacco: Never Used  . Alcohol Use: 0.0 oz/week    0 Not specified per week     Comment: occasional   OB History    No data available     Review of Systems  Constitutional: Negative for fever and chills.  Musculoskeletal: Positive for myalgias, back pain and arthralgias.  All other systems reviewed and are negative.     Allergies  Adhesive and Lisinopril  Home Medications   Prior to Admission medications    Medication Sig Start Date End Date Taking? Authorizing Provider  albuterol (PROVENTIL HFA;VENTOLIN HFA) 108 (90 BASE) MCG/ACT inhaler Inhale 1 puff into the lungs 2 (two) times daily. 09/20/13   Amber Fidel Levy, MD  aspirin 81 MG chewable tablet Chew 81 mg by mouth daily.    Historical Provider, MD  ciprofloxacin (CILOXAN) 0.3 % ophthalmic solution Place 1 drop into both eyes every 4 (four) hours while awake. 06/06/14   Heath Lark, MD  diclofenac sodium (VOLTAREN) 1 % GEL Apply 2 g topically 4 (four) times daily. As needed for pain 09/20/13   [name], MD  FLUoxetine (PROZAC) 20 MG tablet Take 1 tablet (20 mg total) by mouth daily. 09/20/13   Amber Fidel Levy, MD  folic acid (FOLVITE) 1 MG tablet Take 1 tablet (1 mg total) by mouth daily. 05/15/14   Heath Lark, MD  furosemide (LASIX) 20 MG tablet Take 1 tablet (20 mg total) by mouth daily. 09/20/13   Amber Fidel Levy, MD  lidocaine-prilocaine (EMLA) cream Apply topically as needed. Apply to porta cath site one hour prior to needle stick. 09/20/13   Amber Fidel Levy, MD  mirtazapine (REMERON) 15 MG tablet Take 1 tablet (15 mg total) by mouth at bedtime. 03/12/14   Katheren Shams, DO  nebivolol (BYSTOLIC) 10 MG tablet Take 1 tablet (10 mg total) by mouth daily. 09/20/13   Amber Fidel Levy, MD  nitroGLYCERIN (NITROSTAT) 0.4 MG SL tablet Place 1 tablet (0.4 mg total) under the tongue every 5 (five) minutes as needed. For chest pain 10/25/12   Cletus Gash, MD  omeprazole (PRILOSEC) 40 MG capsule Take 1 capsule (40 mg total) by mouth daily. 09/20/13   Amber Fidel Levy, MD  ondansetron (ZOFRAN) 8 MG tablet Take 1 tablet (8 mg total) by mouth every 8 (eight) hours as needed. 05/15/14   Heath Lark, MD  potassium chloride SA (K-DUR,KLOR-CON) 20 MEQ tablet Take 1 tablet (20 mEq total) by mouth 2 (two) times daily. 07/24/13   Heath Lark, MD  pravastatin (PRAVACHOL) 40 MG tablet TAKE 1 TABLET BY MOUTH ONCE DAILY 09/20/13   [name], MD  prochlorperazine  (COMPAZINE) 10 MG tablet Take 1 tablet (10 mg total) by mouth every 6 (six) hours as needed (Nausea or vomiting). 05/15/14   Heath Lark, MD  tiZANidine (ZANAFLEX) 2 MG tablet Take 1 tablet (2 mg total) by mouth every 6 (six) hours as needed (Back Muscle Spasm). 09/20/13   Amber Fidel Levy, MD  traMADol (ULTRAM) 50 MG tablet Take 1 tablet (50 mg total) by mouth every 6 (  six) hours as needed. 09/20/13   Amber Fidel Levy, MD   Triage Vitals: BP 139/69 mmHg  Pulse 86  Temp(Src) 97.7 F (36.5 C) (Oral)  Resp 18  Ht 5\' 4"  (1.626 m)  Wt 168 lb (76.204 kg)  BMI 28.82 kg/m2  SpO2 98%  Physical Exam  Constitutional: She is oriented to person, place, and time. She appears well-developed and well-nourished. No distress.  HENT:  Head: Normocephalic and atraumatic.  Eyes: Conjunctivae and EOM are normal. Pupils are equal, round, and reactive to light.  Neck: Normal range of motion. Neck supple. No JVD present.  Cardiovascular: Normal rate, regular rhythm and normal heart sounds.   No murmur heard. Pulmonary/Chest: Effort normal and breath sounds normal. She has no wheezes. She has no rales. She exhibits no tenderness.  Abdominal: Soft. Bowel sounds are normal. She exhibits no distension and no mass. There is no tenderness.  Musculoskeletal: Normal range of motion. She exhibits no edema.  Positive straight leg rasie on the left at 45 degrees.  Lymphadenopathy:    She has no cervical adenopathy.  Neurological: She is alert and oriented to person, place, and time. No cranial nerve deficit. Coordination normal.  Decreased sensation lateral aspect of left foot and posterior, lateral aspect of left thigh.  Skin: Skin is warm and dry. No rash noted.  Psychiatric: She has a normal mood and affect. Her behavior is normal. Thought content normal.  Nursing note and vitals reviewed.   ED Course  Procedures (including critical care time)  DIAGNOSTIC STUDIES: Oxygen Saturation is 98% on room air, normal by my  interpretation.    COORDINATION OF CARE: [place] Discussed treatment plan with patient which includes prescription for muscle relaxants and pain medication. Patient agrees.   MDM   Final diagnoses:  Sciatica, left    Left-sided sciatic pain. Old records reviewed and she is followed for lung cancer and has had recent CT scan of her abdomen and pelvis which showed no evidence of any significant pathology in the lumbar vertebrae. There is no indication for imaging at this point. She is discharged with prescriptions for diazepam for muscle relaxation and oxycodone-acetaminophen for pain. She is referred back to her PCP for follow-up.  I personally performed the services described in this documentation, which was scribed in my presence. The recorded information has been reviewed and is accurate.     Delora Fuel, MD 97/02/63 [id number]

## 2014-06-22 ENCOUNTER — Encounter: Payer: Self-pay | Admitting: Obstetrics and Gynecology

## 2014-06-22 ENCOUNTER — Ambulatory Visit (INDEPENDENT_AMBULATORY_CARE_PROVIDER_SITE_OTHER): Payer: Commercial Managed Care - HMO | Admitting: Obstetrics and Gynecology

## 2014-06-22 VITALS — BP 148/82 | HR 88 | Temp 98.3°F | Ht 64.0 in | Wt 173.3 lb

## 2014-06-22 DIAGNOSIS — M5440 Lumbago with sciatica, unspecified side: Secondary | ICD-10-CM

## 2014-06-22 DIAGNOSIS — C3410 Malignant neoplasm of upper lobe, unspecified bronchus or lung: Secondary | ICD-10-CM | POA: Diagnosis not present

## 2014-06-22 DIAGNOSIS — M5442 Lumbago with sciatica, left side: Secondary | ICD-10-CM

## 2014-06-22 HISTORY — DX: Lumbago with sciatica, unspecified side: M54.40

## 2014-06-22 MED ORDER — OXYCODONE-ACETAMINOPHEN 5-325 MG PO TABS: 1.0000 | ORAL_TABLET | ORAL | Status: DC | PRN

## 2014-06-22 MED ORDER — BACLOFEN 10 MG PO TABS: 10.0000 mg | ORAL_TABLET | Freq: Three times a day (TID) | ORAL | Status: DC

## 2014-06-22 NOTE — Progress Notes (Signed)
     Subjective: Chief Complaint  Patient presents with  . Leg Pain    left heel pain shooting up left leg    HPI: Lauren [name] is a 64 y.o. presenting to clinic today to discuss the following:  #Leg Pain: Started hurting on the Jan. 29th. Pain shooting up leg. Stabbing pain. Used tramadol and muscle relaxant which did not help. Voltran gel also did not help. Couldn't sleep so went to ED on 1/30. Given diazepam and oxycodone. Was told she needed to go see surgeons who performed her back surgery. Has gone to see [facility] before.  Had fractured disc with sciatic that was fixed. She was told that if surgery didn't heal after year permentant nerve damage. Pain is only in left leg. Starts at hell and shots up the whole lateral aspect of her leg. No particular time when pain starts. No pain in back. Just up leg. Back of leg numb no sensation.   #Lung Cancer: Patient states that her cancer has come back in both her lungs. She is currently doing chemo treatment for this. She has stage 4 cancer with metastasis.  Says she had cancer in her colon and brain. SHe had eemission before now back.   Health Maintenance: Colonoscopy and Mammogram  All systems were reviewed and were negative unless otherwise noted in the HPI Past Medical, Surgical, Social, and Family History Reviewed & Updated per EMR.   Objective: BP 148/82 mmHg  Pulse 88  Temp(Src) 98.3 F (36.8 C) (Oral)  Ht 5\' 4"  (1.626 m)  Wt 173 lb 4.8 oz (78.608 kg)  BMI 29.73 kg/m2  Physical Exam  Constitutional: She is oriented to person, place, and time and well-developed, well-nourished, and in no distress.  Cardiovascular: Normal rate, regular rhythm, normal heart sounds and intact distal pulses.   Pulmonary/Chest: Effort normal and breath sounds normal.  Musculoskeletal:       Left hip: She exhibits decreased range of motion and decreased strength. She exhibits no bony tenderness.       Left ankle: She exhibits decreased range of  motion. She exhibits no swelling.  Neurological: She is alert and oriented to person, place, and time. She displays weakness. A sensory deficit is present. She exhibits normal muscle tone. She has an abnormal Straight Leg Raise Test. Gait abnormal.    Assessment/Plan: Please see problem based Assessment and Plan  Health Maintainance: Discussed need for patient to schedule colonoscopy and mammogram. With metastatic disease do not know if patient has 28yr survival.    Orders Placed This Encounter  Procedures  . Ambulatory referral to Orthopedic Surgery    Referral Priority:  Routine    Referral Type:  Surgical    Referral Reason:  Specialty Services Required    Requested Specialty:  Orthopedic Surgery    Number of Visits Requested:  1    Meds ordered this encounter  Medications  . oxyCODONE-acetaminophen (PERCOCET/ROXICET) 5-325 MG per tablet    Sig: Take 1 tablet by mouth every 4 (four) hours as needed for severe pain.    Dispense:  30 tablet    Refill:  0  . baclofen (LIORESAL) 10 MG tablet    Sig: Take 1 tablet (10 mg total) by mouth 3 (three) times daily.    Dispense:  30 each    Refill:  [place], DO 06/23/2014, 2:22 PM PGY-1, [place]

## 2014-06-22 NOTE — Patient Instructions (Addendum)
Lauren [name] it was great to see you today!  Here are some of the things we discussed today: - I placed a referral for you to go back to orthopedic surgery - Refilled pain medication for 30 days. We will need to reevaluate this and find other means of pain relief  Please schedule a follow-up appointment for 2 months.   Thanks for allowing me to be a part of your care! Dr. Gerarda Fraction   Sciatica Sciatica is pain, weakness, numbness, or tingling along the path of the sciatic nerve. The nerve starts in the lower back and runs down the back of each leg. The nerve controls the muscles in the lower leg and in the back of the knee, while also providing sensation to the back of the thigh, lower leg, and the sole of your foot. Sciatica is a symptom of another medical condition. For instance, nerve damage or certain conditions, such as a herniated disk or bone spur on the spine, pinch or put pressure on the sciatic nerve. This causes the pain, weakness, or other sensations normally associated with sciatica. Generally, sciatica only affects one side of the body. CAUSES   Herniated or slipped disc.  Degenerative disk disease.  A pain disorder involving the narrow muscle in the buttocks (piriformis syndrome).  Pelvic injury or fracture.  Pregnancy.  Tumor (rare). SYMPTOMS  Symptoms can vary from mild to very severe. The symptoms usually travel from the low back to the buttocks and down the back of the leg. Symptoms can include:  Mild tingling or dull aches in the lower back, leg, or hip.  Numbness in the back of the calf or sole of the foot.  Burning sensations in the lower back, leg, or hip.  Sharp pains in the lower back, leg, or hip.  Leg weakness.  Severe back pain inhibiting movement. These symptoms may get worse with coughing, sneezing, laughing, or prolonged sitting or standing. Also, being overweight may worsen symptoms. DIAGNOSIS  Your caregiver will perform a physical exam to  look for common symptoms of sciatica. He or she may ask you to do certain movements or activities that would trigger sciatic nerve pain. Other tests may be performed to find the cause of the sciatica. These may include:  Blood tests.  X-rays.  Imaging tests, such as an MRI or CT scan. TREATMENT  Treatment is directed at the cause of the sciatic pain. Sometimes, treatment is not necessary and the pain and discomfort goes away on its own. If treatment is needed, your caregiver may suggest:  Over-the-counter medicines to relieve pain.  Prescription medicines, such as anti-inflammatory medicine, muscle relaxants, or narcotics.  Applying heat or ice to the painful area.  Steroid injections to lessen pain, irritation, and inflammation around the nerve.  Reducing activity during periods of pain.  Exercising and stretching to strengthen your abdomen and improve flexibility of your spine. Your caregiver may suggest losing weight if the extra weight makes the back pain worse.  Physical therapy.  Surgery to eliminate what is pressing or pinching the nerve, such as a bone spur or part of a herniated disk. HOME CARE INSTRUCTIONS   Only take over-the-counter or prescription medicines for pain or discomfort as directed by your caregiver.  Apply ice to the affected area for 20 minutes, 3-4 times a day for the first 48-72 hours. Then try heat in the same way.  Exercise, stretch, or perform your usual activities if these do not aggravate your pain.  Attend physical  therapy sessions as directed by your caregiver.  Keep all follow-up appointments as directed by your caregiver.  Do not wear high heels or shoes that do not provide proper support.  Check your mattress to see if it is too soft. A firm mattress may lessen your pain and discomfort. SEEK IMMEDIATE MEDICAL CARE IF:   You lose control of your bowel or bladder (incontinence).  You have increasing weakness in the lower back, pelvis,  buttocks, or legs.  You have redness or swelling of your back.  You have a burning sensation when you urinate.  You have pain that gets worse when you lie down or awakens you at night.  Your pain is worse than you have experienced in the past.  Your pain is lasting longer than 4 weeks.  You are suddenly losing weight without reason. MAKE SURE YOU:  Understand these instructions.  Will watch your condition.  Will get help right away if you are not doing well or get worse. Document Released: 04/28/2001 Document Revised: 11/03/2011 Document Reviewed: 09/13/2011 [facility] Patient Information 2015 [place], [place]. This information is not intended to replace advice given to you by your health care provider. Make sure you discuss any questions you have with your health care provider.

## 2014-06-23 NOTE — Assessment & Plan Note (Signed)
Patient to continue to follow-up with oncologist. Currently doing chemotherapy. Will continue to follow her treatments and assist as needed.

## 2014-06-23 NOTE — Assessment & Plan Note (Signed)
A: +SLR, nontender to palpation in lower back. With metastatic cancer I wonder if she is getting bony metastasis causing sciatica and nerve pain. No red flags. P: Patient with prior treatment with orthopedic surgeon for back pain and sciatica. Will refer her back to surgeons as she would prefer this. Patient given one time refill on pain medications until she can be seen. Patient to follow-up in a month. She is to continue conservative management(ice, pain gel, rest) and pain medications.

## 2014-06-27 ENCOUNTER — Ambulatory Visit (HOSPITAL_BASED_OUTPATIENT_CLINIC_OR_DEPARTMENT_OTHER): Payer: Commercial Managed Care - HMO

## 2014-06-27 ENCOUNTER — Other Ambulatory Visit (HOSPITAL_BASED_OUTPATIENT_CLINIC_OR_DEPARTMENT_OTHER): Payer: Commercial Managed Care - HMO

## 2014-06-27 DIAGNOSIS — C7931 Secondary malignant neoplasm of brain: Secondary | ICD-10-CM

## 2014-06-27 DIAGNOSIS — C779 Secondary and unspecified malignant neoplasm of lymph node, unspecified: Secondary | ICD-10-CM

## 2014-06-27 DIAGNOSIS — C3410 Malignant neoplasm of upper lobe, unspecified bronchus or lung: Secondary | ICD-10-CM

## 2014-06-27 DIAGNOSIS — C3411 Malignant neoplasm of upper lobe, right bronchus or lung: Secondary | ICD-10-CM

## 2014-06-27 DIAGNOSIS — Z5111 Encounter for antineoplastic chemotherapy: Secondary | ICD-10-CM

## 2014-06-27 LAB — COMPREHENSIVE METABOLIC PANEL (CC13)
ALT: 9 U/L (ref 0–55)
AST: 12 U/L (ref 5–34)
Albumin: 3.2 g/dL — ABNORMAL LOW (ref 3.5–5.0)
Alkaline Phosphatase: 146 U/L (ref 40–150)
Anion Gap: 6 mEq/L (ref 3–11)
BILIRUBIN TOTAL: 0.37 mg/dL (ref 0.20–1.20)
BUN: 7.6 mg/dL (ref 7.0–26.0)
CALCIUM: 9.4 mg/dL (ref 8.4–10.4)
CO2: 29 meq/L (ref 22–29)
CREATININE: 0.9 mg/dL (ref 0.6–1.1)
Chloride: 107 mEq/L (ref 98–109)
EGFR: 77 mL/min/{1.73_m2} — ABNORMAL LOW (ref >90)
Glucose: 98 mg/dl (ref 70–140)
Potassium: 3.9 mEq/L (ref 3.5–5.1)
Sodium: 142 mEq/L (ref 136–145)
Total Protein: 7.2 g/dL (ref 6.4–8.3)

## 2014-06-27 LAB — CBC WITH DIFFERENTIAL/PLATELET
BASO%: 0.5 % (ref 0.0–2.0)
Basophils Absolute: 0 10*3/uL (ref 0.0–0.1)
EOS%: 1.3 % (ref 0.0–7.0)
Eosinophils Absolute: 0 10*3/uL (ref 0.0–0.5)
HCT: 39.7 % (ref 34.8–46.6)
HGB: 12.2 g/dL (ref 11.6–15.9)
LYMPH#: 0.7 10*3/uL — AB (ref 0.9–3.3)
LYMPH%: 21.4 % (ref 14.0–49.7)
MCH: 23.4 pg — ABNORMAL LOW (ref 25.1–34.0)
MCHC: 30.8 g/dL — AB (ref 31.5–36.0)
MCV: 75.9 fL — ABNORMAL LOW (ref 79.5–101.0)
MONO#: 0.5 10*3/uL (ref 0.1–0.9)
MONO%: 14.8 % — ABNORMAL HIGH (ref 0.0–14.0)
NEUT#: 2.1 10*3/uL (ref 1.5–6.5)
NEUT%: 62 % (ref 38.4–76.8)
PLATELETS: 260 10*3/uL (ref 145–400)
RBC: 5.23 10*6/uL (ref 3.70–5.45)
RDW: 17.9 % — ABNORMAL HIGH (ref 11.2–14.5)
WBC: 3.3 10*3/uL — AB (ref 3.9–10.3)

## 2014-06-27 MED ORDER — CYANOCOBALAMIN 1000 MCG/ML IJ SOLN
INTRAMUSCULAR | Status: AC
  Filled 2014-06-27: qty 1

## 2014-06-27 MED ORDER — CARBOPLATIN CHEMO INJECTION 600 MG/60ML
458.0000 mg | Freq: Once | INTRAVENOUS | Status: AC
  Administered 2014-06-27: 460 mg via INTRAVENOUS
  Filled 2014-06-27: qty 46

## 2014-06-27 MED ORDER — CYANOCOBALAMIN 1000 MCG/ML IJ SOLN
1000.0000 ug | Freq: Once | INTRAMUSCULAR | Status: DC
  Administered 2014-06-27: 1000 ug via INTRAMUSCULAR

## 2014-06-27 MED ORDER — SODIUM CHLORIDE 0.9 % IJ SOLN
10.0000 mL | INTRAMUSCULAR | Status: DC | PRN
  Administered 2014-06-27: 10 mL
  Filled 2014-06-27: qty 10

## 2014-06-27 MED ORDER — SODIUM CHLORIDE 0.9 % IV SOLN
Freq: Once | INTRAVENOUS | Status: AC
  Administered 2014-06-27: 10:00:00 via INTRAVENOUS

## 2014-06-27 MED ORDER — DEXAMETHASONE SODIUM PHOSPHATE 20 MG/5ML IJ SOLN
INTRAMUSCULAR | Status: AC
  Filled 2014-06-27: qty 5

## 2014-06-27 MED ORDER — SODIUM CHLORIDE 0.9 % IV SOLN
490.0000 mg/m2 | Freq: Once | INTRAVENOUS | Status: AC
  Administered 2014-06-27: 900 mg via INTRAVENOUS
  Filled 2014-06-27: qty 36

## 2014-06-27 MED ORDER — ONDANSETRON 16 MG/50ML IVPB (CHCC)
INTRAVENOUS | Status: AC
  Filled 2014-06-27: qty 16

## 2014-06-27 MED ORDER — ONDANSETRON 16 MG/50ML IVPB (CHCC)
16.0000 mg | Freq: Once | INTRAVENOUS | Status: AC
  Administered 2014-06-27: 16 mg via INTRAVENOUS

## 2014-06-27 MED ORDER — HEPARIN SOD (PORK) LOCK FLUSH 100 UNIT/ML IV SOLN
500.0000 [IU] | Freq: Once | INTRAVENOUS | Status: AC | PRN
  Administered 2014-06-27: 500 [IU]
  Filled 2014-06-27: qty 5

## 2014-06-27 MED ORDER — DEXAMETHASONE SODIUM PHOSPHATE 20 MG/5ML IJ SOLN
20.0000 mg | Freq: Once | INTRAMUSCULAR | Status: AC
  Administered 2014-06-27: 20 mg via INTRAVENOUS

## 2014-06-27 NOTE — Patient Instructions (Addendum)
[place] Discharge Instructions for Patients Receiving Chemotherapy  Today you received the following chemotherapy agents:  Alimta and Carboplatin  To help prevent nausea and vomiting after your treatment, we encourage you to take your nausea medication as ordered per MD.   If you develop nausea and vomiting that is not controlled by your nausea medication, call the clinic.   BELOW ARE SYMPTOMS THAT SHOULD BE REPORTED IMMEDIATELY:  *FEVER GREATER THAN 100.5 F  *CHILLS WITH OR WITHOUT FEVER  NAUSEA AND VOMITING THAT IS NOT CONTROLLED WITH YOUR NAUSEA MEDICATION  *UNUSUAL SHORTNESS OF BREATH  *UNUSUAL BRUISING OR BLEEDING  TENDERNESS IN MOUTH AND THROAT WITH OR WITHOUT PRESENCE OF ULCERS  *URINARY PROBLEMS  *BOWEL PROBLEMS  UNUSUAL RASH Items with * indicate a potential emergency and should be followed up as soon as possible.  Feel free to call the clinic you have any questions or concerns. The clinic phone number is [phone].

## 2014-07-12 ENCOUNTER — Telehealth: Payer: Self-pay | Admitting: *Deleted

## 2014-07-12 NOTE — Telephone Encounter (Signed)
Per staff message and POF I have scheduled appts. Advised scheduler of appts. JMW  

## 2014-07-16 ENCOUNTER — Ambulatory Visit (HOSPITAL_COMMUNITY)
Admission: RE | Admit: 2014-07-16 | Discharge: 2014-07-16 | Disposition: A | Discharge status: Home / Self Care | Payer: Commercial Managed Care - HMO | Source: Ambulatory Visit | Attending: Hematology and Oncology | Admitting: Hematology and Oncology

## 2014-07-16 ENCOUNTER — Other Ambulatory Visit: Payer: Self-pay

## 2014-07-16 ENCOUNTER — Other Ambulatory Visit (HOSPITAL_BASED_OUTPATIENT_CLINIC_OR_DEPARTMENT_OTHER): Payer: Commercial Managed Care - HMO

## 2014-07-16 ENCOUNTER — Ambulatory Visit: Payer: Commercial Managed Care - HMO

## 2014-07-16 ENCOUNTER — Encounter (HOSPITAL_COMMUNITY): Payer: Self-pay

## 2014-07-16 VITALS — BP 140/71 | HR 85 | Temp 97.7°F

## 2014-07-16 DIAGNOSIS — C3490 Malignant neoplasm of unspecified part of unspecified bronchus or lung: Secondary | ICD-10-CM | POA: Diagnosis not present

## 2014-07-16 DIAGNOSIS — C779 Secondary and unspecified malignant neoplasm of lymph node, unspecified: Secondary | ICD-10-CM | POA: Diagnosis not present

## 2014-07-16 DIAGNOSIS — Z85038 Personal history of other malignant neoplasm of large intestine: Secondary | ICD-10-CM | POA: Diagnosis not present

## 2014-07-16 DIAGNOSIS — Z720 Tobacco use: Secondary | ICD-10-CM | POA: Insufficient documentation

## 2014-07-16 DIAGNOSIS — C3410 Malignant neoplasm of upper lobe, unspecified bronchus or lung: Secondary | ICD-10-CM | POA: Insufficient documentation

## 2014-07-16 DIAGNOSIS — C3411 Malignant neoplasm of upper lobe, right bronchus or lung: Secondary | ICD-10-CM

## 2014-07-16 DIAGNOSIS — Z95828 Presence of other vascular implants and grafts: Secondary | ICD-10-CM

## 2014-07-16 DIAGNOSIS — C7931 Secondary malignant neoplasm of brain: Secondary | ICD-10-CM | POA: Diagnosis not present

## 2014-07-16 LAB — CBC WITH DIFFERENTIAL/PLATELET
BASO%: 0.4 % (ref 0.0–2.0)
BASOS ABS: 0 10*3/uL (ref 0.0–0.1)
EOS%: 2.1 % (ref 0.0–7.0)
Eosinophils Absolute: 0.1 10*3/uL (ref 0.0–0.5)
HEMATOCRIT: 36 % (ref 34.8–46.6)
HGB: 11.1 g/dL — ABNORMAL LOW (ref 11.6–15.9)
LYMPH%: 27.7 % (ref 14.0–49.7)
MCH: 24.5 pg — ABNORMAL LOW (ref 25.1–34.0)
MCHC: 30.8 g/dL — AB (ref 31.5–36.0)
MCV: 79.5 fL (ref 79.5–101.0)
MONO#: 0.3 10*3/uL (ref 0.1–0.9)
MONO%: 10.3 % (ref 0.0–14.0)
NEUT#: 1.7 10*3/uL (ref 1.5–6.5)
NEUT%: 59.5 % (ref 38.4–76.8)
Platelets: 188 10*3/uL (ref 145–400)
RBC: 4.53 10*6/uL (ref 3.70–5.45)
RDW: 19.4 % — ABNORMAL HIGH (ref 11.2–14.5)
WBC: 2.8 10*3/uL — AB (ref 3.9–10.3)
lymph#: 0.8 10*3/uL — ABNORMAL LOW (ref 0.9–3.3)

## 2014-07-16 LAB — COMPREHENSIVE METABOLIC PANEL (CC13)
ALT: 6 U/L (ref 0–55)
AST: 11 U/L (ref 5–34)
Albumin: 2.9 g/dL — ABNORMAL LOW (ref 3.5–5.0)
Alkaline Phosphatase: 128 U/L (ref 40–150)
Anion Gap: 10 meq/L (ref 3–11)
BUN: 11.2 mg/dL (ref 7.0–26.0)
CO2: 22 meq/L (ref 22–29)
Calcium: 8.7 mg/dL (ref 8.4–10.4)
Chloride: 110 meq/L — ABNORMAL HIGH (ref 98–109)
Creatinine: 0.9 mg/dL (ref 0.6–1.1)
EGFR: 76 ml/min/1.73 m2 — ABNORMAL LOW
Glucose: 140 mg/dL (ref 70–140)
Potassium: 4 meq/L (ref 3.5–5.1)
Sodium: 142 meq/L (ref 136–145)
Total Bilirubin: 0.32 mg/dL (ref 0.20–1.20)
Total Protein: 6.5 g/dL (ref 6.4–8.3)

## 2014-07-16 MED ORDER — IOHEXOL 300 MG/ML  SOLN
100.0000 mL | Freq: Once | INTRAMUSCULAR | Status: AC | PRN
  Administered 2014-07-16: 100 mL via INTRAVENOUS

## 2014-07-16 MED ORDER — IOHEXOL 350 MG/ML SOLN: 50.0000 mL | Freq: Once | INTRAVENOUS | Status: DC | PRN

## 2014-07-16 MED ORDER — HEPARIN SOD (PORK) LOCK FLUSH 100 UNIT/ML IV SOLN
500.0000 [IU] | Freq: Once | INTRAVENOUS | Status: AC
  Administered 2014-07-16: 500 [IU] via INTRAVENOUS
  Filled 2014-07-16: qty 5

## 2014-07-16 MED ORDER — SODIUM CHLORIDE 0.9 % IJ SOLN
10.0000 mL | INTRAMUSCULAR | Status: DC | PRN
  Administered 2014-07-16: 10 mL via INTRAVENOUS
  Filled 2014-07-16: qty 10

## 2014-07-16 MED ORDER — IOHEXOL 300 MG/ML  SOLN
50.0000 mL | Freq: Once | INTRAMUSCULAR | Status: AC | PRN
  Administered 2014-07-16: 50 mL via ORAL

## 2014-07-16 NOTE — Patient Instructions (Signed)

## 2014-07-18 ENCOUNTER — Ambulatory Visit (HOSPITAL_BASED_OUTPATIENT_CLINIC_OR_DEPARTMENT_OTHER): Payer: Commercial Managed Care - HMO

## 2014-07-18 ENCOUNTER — Telehealth: Payer: Self-pay | Admitting: Hematology and Oncology

## 2014-07-18 ENCOUNTER — Ambulatory Visit (HOSPITAL_BASED_OUTPATIENT_CLINIC_OR_DEPARTMENT_OTHER): Payer: Commercial Managed Care - HMO | Admitting: Hematology and Oncology

## 2014-07-18 ENCOUNTER — Other Ambulatory Visit: Payer: Self-pay

## 2014-07-18 VITALS — BP 152/66 | HR 85 | Temp 97.8°F | Resp 18 | Ht 64.0 in | Wt 171.6 lb

## 2014-07-18 DIAGNOSIS — C7931 Secondary malignant neoplasm of brain: Secondary | ICD-10-CM

## 2014-07-18 DIAGNOSIS — R05 Cough: Secondary | ICD-10-CM

## 2014-07-18 DIAGNOSIS — C3411 Malignant neoplasm of upper lobe, right bronchus or lung: Secondary | ICD-10-CM

## 2014-07-18 DIAGNOSIS — T451X5A Adverse effect of antineoplastic and immunosuppressive drugs, initial encounter: Secondary | ICD-10-CM

## 2014-07-18 DIAGNOSIS — Z720 Tobacco use: Secondary | ICD-10-CM

## 2014-07-18 DIAGNOSIS — F17200 Nicotine dependence, unspecified, uncomplicated: Secondary | ICD-10-CM

## 2014-07-18 DIAGNOSIS — C7949 Secondary malignant neoplasm of other parts of nervous system: Secondary | ICD-10-CM

## 2014-07-18 DIAGNOSIS — G620 Drug-induced polyneuropathy: Secondary | ICD-10-CM

## 2014-07-18 DIAGNOSIS — G622 Polyneuropathy due to other toxic agents: Secondary | ICD-10-CM

## 2014-07-18 DIAGNOSIS — Z5111 Encounter for antineoplastic chemotherapy: Secondary | ICD-10-CM

## 2014-07-18 DIAGNOSIS — M5442 Lumbago with sciatica, left side: Secondary | ICD-10-CM

## 2014-07-18 DIAGNOSIS — C3412 Malignant neoplasm of upper lobe, left bronchus or lung: Secondary | ICD-10-CM

## 2014-07-18 DIAGNOSIS — C3410 Malignant neoplasm of upper lobe, unspecified bronchus or lung: Secondary | ICD-10-CM

## 2014-07-18 HISTORY — DX: Lumbago with sciatica, left side: M54.42

## 2014-07-18 MED ORDER — ONDANSETRON 16 MG/50ML IVPB (CHCC)
INTRAVENOUS | Status: AC
  Filled 2014-07-18: qty 16

## 2014-07-18 MED ORDER — DEXAMETHASONE SODIUM PHOSPHATE 20 MG/5ML IJ SOLN
INTRAMUSCULAR | Status: AC
  Filled 2014-07-18: qty 5

## 2014-07-18 MED ORDER — CYANOCOBALAMIN 1000 MCG/ML IJ SOLN
1000.0000 ug | Freq: Once | INTRAMUSCULAR | Status: AC
  Administered 2014-07-18: 1000 ug via INTRAMUSCULAR

## 2014-07-18 MED ORDER — CYANOCOBALAMIN 1000 MCG/ML IJ SOLN
INTRAMUSCULAR | Status: AC
  Filled 2014-07-18: qty 1

## 2014-07-18 MED ORDER — SODIUM CHLORIDE 0.9 % IV SOLN
500.0000 mg/m2 | Freq: Once | INTRAVENOUS | Status: AC
  Administered 2014-07-18: 925 mg via INTRAVENOUS
  Filled 2014-07-18: qty 37

## 2014-07-18 MED ORDER — HEPARIN SOD (PORK) LOCK FLUSH 100 UNIT/ML IV SOLN
500.0000 [IU] | Freq: Once | INTRAVENOUS | Status: AC | PRN
  Administered 2014-07-18: 500 [IU]
  Filled 2014-07-18: qty 5

## 2014-07-18 MED ORDER — SODIUM CHLORIDE 0.9 % IJ SOLN
10.0000 mL | INTRAMUSCULAR | Status: DC | PRN
  Administered 2014-07-18: 10 mL
  Filled 2014-07-18: qty 10

## 2014-07-18 MED ORDER — SODIUM CHLORIDE 0.9 % IV SOLN
Freq: Once | INTRAVENOUS | Status: AC
  Administered 2014-07-18: 11:00:00 via INTRAVENOUS

## 2014-07-18 MED ORDER — OXYCODONE HCL 5 MG PO TABS: 5.0000 mg | ORAL_TABLET | Freq: Four times a day (QID) | ORAL | Status: DC | PRN

## 2014-07-18 MED ORDER — SODIUM CHLORIDE 0.9 % IV SOLN
458.0000 mg | Freq: Once | INTRAVENOUS | Status: AC
  Administered 2014-07-18: 460 mg via INTRAVENOUS
  Filled 2014-07-18: qty 46

## 2014-07-18 MED ORDER — ONDANSETRON 16 MG/50ML IVPB (CHCC)
16.0000 mg | Freq: Once | INTRAVENOUS | Status: AC
  Administered 2014-07-18: 16 mg via INTRAVENOUS

## 2014-07-18 MED ORDER — DEXAMETHASONE SODIUM PHOSPHATE 20 MG/5ML IJ SOLN
20.0000 mg | Freq: Once | INTRAMUSCULAR | Status: AC
  Administered 2014-07-18: 20 mg via INTRAVENOUS

## 2014-07-18 NOTE — Patient Instructions (Signed)
[place] Discharge Instructions for Patients Receiving Chemotherapy  Today you received the following chemotherapy agents Alimta and Carboplatin  To help prevent nausea and vomiting after your treatment, we encourage you to take your nausea medication as prescribed.   If you develop nausea and vomiting that is not controlled by your nausea medication, call the clinic.   BELOW ARE SYMPTOMS THAT SHOULD BE REPORTED IMMEDIATELY:  *FEVER GREATER THAN 100.5 F  *CHILLS WITH OR WITHOUT FEVER  NAUSEA AND VOMITING THAT IS NOT CONTROLLED WITH YOUR NAUSEA MEDICATION  *UNUSUAL SHORTNESS OF BREATH  *UNUSUAL BRUISING OR BLEEDING  TENDERNESS IN MOUTH AND THROAT WITH OR WITHOUT PRESENCE OF ULCERS  *URINARY PROBLEMS  *BOWEL PROBLEMS  UNUSUAL RASH Items with * indicate a potential emergency and should be followed up as soon as possible.  Feel free to call the clinic you have any questions or concerns. The clinic phone number is [phone].

## 2014-07-18 NOTE — Telephone Encounter (Signed)
gv and printed appt sched and avs fo rpt for march and April .Marland Kitchen...sed added tx.

## 2014-07-19 DIAGNOSIS — T451X5A Adverse effect of antineoplastic and immunosuppressive drugs, initial encounter: Secondary | ICD-10-CM

## 2014-07-19 DIAGNOSIS — G620 Drug-induced polyneuropathy: Secondary | ICD-10-CM | POA: Insufficient documentation

## 2014-07-19 NOTE — Assessment & Plan Note (Signed)
I spent some time counseling the patient the importance of tobacco cessation. She is currently not interested to quit now.

## 2014-07-19 NOTE — Assessment & Plan Note (Signed)
Recent MRI is negative for disease recurrence. She is not symptomatic.

## 2014-07-19 NOTE — Assessment & Plan Note (Signed)
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 We will discontinue carboplatin as above. She requests a of prescription pain medicine. I will give her prescription oxycodone. I warned her about risk of constipation. We discussed about narcotic refill policy.

## 2014-07-19 NOTE — Assessment & Plan Note (Signed)
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 This was probably predisposed by sciatica on the left foot. I will discontinue carboplatin in the future.

## 2014-07-19 NOTE — Assessment & Plan Note (Signed)
She tolerated treatment well. Recent  Imaging study on 07/16/2014 show regression in size of tumors. After treatment on 07/18/2014, she would proceed to maintenance Alimta without carboplatin every 3 weeks. I will see her every 6 weeks prior to treatment.

## 2014-07-19 NOTE — Progress Notes (Signed)
[facility] OFFICE PROGRESS NOTE  Patient Care Team: Katheren Shams, DO as PCP - General (Family Medicine) Gaye Pollack, MD (Cardiothoracic Surgery) Thea Silversmith, MD (Radiation Oncology) Dickie La, MD (Family Medicine) Heath Lark, MD as Consulting Physician (Hematology and Oncology)  SUMMARY OF ONCOLOGIC HISTORY: Oncology History   Colon cancer   Primary site: Colon and Rectum (Left)   Staging method: AJCC 7th Edition   Clinical: Stage I (T2, N0, M0) signed by Heath Lark, MD on 05/31/2013  2:39 PM   Pathologic: Stage I (T2, N0, cM0) signed by Heath Lark, MD on 05/31/2013  2:39 PM   Summary: Stage I (T2, N0, cM0) Lung cancer, EGFR/ALK negative, recurrence after initial resection to LN and brain   Primary site: Lung (Left)   Staging method: AJCC 7th Edition   Clinical: Stage IV (T1, N2, M1b) signed by Heath Lark, MD on 05/31/2013  2:26 PM   Pathologic: Stage IV (T1, N2, M1b) signed by Heath Lark, MD on 05/31/2013  2:26 PM   Summary: Stage IV (T1, N2, M1b)       Cancer of upper lobe of left lung   03/01/2007 Procedure Colonoscopy revealed abnormalities and biopsy show high-grade dysplasia   04/01/2007 Surgery She underwent sigmoid resection which showed T2 N0 colon cancer, and negative margins and all of 17 lymph nodes were negative   02/29/2008 Procedure Repeat surveillance colonoscopy was negative.   06/16/2010 Surgery She underwent left upper lobectomy we show well-differentiated adenocarcinoma of the lung, T1, N0, M0   03/18/2011 Procedure Repeat colonoscopy show multiple polyps but there were benign   10/19/2011 Procedure Biopsy of mediastinal lymph node came back positive for recurrence of lung cancer, EGFR and ALK negative   11/16/2011 - 12/14/2011 Chemotherapy She received concurrent chemoradiation therapy with weekly carboplatin and Taxol.   11/16/2011 - 12/29/2011 Radiation Therapy She received radiation therapy with weekly chemotherapy   02/15/2012 Imaging MR of  the brain showed a new intracranial metastases. This was subsequently treated with stereotactic radiosurgery.   03/07/2012 - 04/12/2013 Chemotherapy She received chemotherapy with maintainence Alimta every 3 weeks. Chemotherapy was discontinued due to profound fatigue   03/02/2013 Procedure She had therapeutic ultrasound guidance thoracentesis for pleural effusion that came back negative for cancer   05/04/2013 Procedure She had repeat ultrasound-guided thoracentesis again and cytology was negative   05/29/2013 Imaging Repeat CT scan of the chest, abdomen and pelvis show no evidence of disease but persistent right-sided pleural effusion   06/02/2013 Surgery The patient had placement of Pleurx catheter and subsequently underwent pleurodesis.   09/22/2013 Imaging Repeat CT scan show no evidence of active disease. There are nonspecific lymphadenopathy and she is placed on observation.   03/23/2014 Imaging Repeat CT scan of the chest, abdomen and pelvis show recurrence of cancer with widespread bilateral pulmonary metastasis.   05/14/2014 Imaging Imaging of the chest and brain were repeated due to delay of initiation of chemotherapy. Overall, chest CT scan show stable disease. MRI of the head was negative for recurrence   05/16/2014 - 07/18/2014 Chemotherapy  she completed 4 cycles of combination chemotherapy with carboplatin and Alimta   07/16/2014 Imaging  repeat CT scan of the chest, abdomen and pelvis show regression in the size of lung nodules.    Brain metastases treated with radiosurgery   02/26/2012 Initial Diagnosis Brain metastases treated with radiosurgery    INTERVAL HISTORY: Please see below for problem oriented charting.  she is seen to review test results. She  tolerated treatment well from neuropathy in the left foot and back pain.  she denies recent infection. She continues to have chronic nonproductive cough and continue to smoke.  REVIEW OF SYSTEMS:   Constitutional: Denies fevers,  chills or abnormal weight loss Eyes: Denies blurriness of vision Ears, nose, mouth, throat, and face: Denies mucositis or sore throat Cardiovascular: Denies palpitation, chest discomfort or lower extremity swelling Gastrointestinal:  Denies nausea, heartburn or change in bowel habits Skin: Denies abnormal skin rashes Lymphatics: Denies new lymphadenopathy or easy bruising Behavioral/Psych: Mood is stable, no new changes  All other systems were reviewed with the patient and are negative.  I have reviewed the past medical history, past surgical history, social history and family history with the patient and they are unchanged from previous note.  ALLERGIES:  is allergic to adhesive and lisinopril.  MEDICATIONS:  Current Outpatient Prescriptions  Medication Sig Dispense Refill  . albuterol (PROVENTIL HFA;VENTOLIN HFA) 108 (90 BASE) MCG/ACT inhaler Inhale 1 puff into the lungs 2 (two) times daily. 6.7 g prn  . aspirin 81 MG chewable tablet Chew 81 mg by mouth daily.    . diclofenac sodium (VOLTAREN) 1 % GEL Apply 2 g topically 4 (four) times daily. As needed for pain 100 g 11  . FLUoxetine (PROZAC) 20 MG tablet Take 1 tablet (20 mg total) by mouth daily. 90 tablet 3  . furosemide (LASIX) 20 MG tablet Take 1 tablet (20 mg total) by mouth daily. 90 tablet 3  . lidocaine-prilocaine (EMLA) cream Apply topically as needed. Apply to porta cath site one hour prior to needle stick. 30 g prn  . mirtazapine (REMERON) 15 MG tablet Take 1 tablet (15 mg total) by mouth at bedtime. 90 tablet 0  . nebivolol (BYSTOLIC) 10 MG tablet Take 1 tablet (10 mg total) by mouth daily. 90 tablet 4  . nitroGLYCERIN (NITROSTAT) 0.4 MG SL tablet Place 1 tablet (0.4 mg total) under the tongue every 5 (five) minutes as needed. For chest pain 10 tablet 0  . omeprazole (PRILOSEC) 40 MG capsule Take 1 capsule (40 mg total) by mouth daily. 90 capsule 3  . potassium chloride SA (K-DUR,KLOR-CON) 20 MEQ tablet Take 1 tablet (20 mEq  total) by mouth 2 (two) times daily. 180 tablet 0  . pravastatin (PRAVACHOL) 40 MG tablet TAKE 1 TABLET BY MOUTH ONCE DAILY 90 tablet 3  . tiZANidine (ZANAFLEX) 2 MG tablet Take 1 tablet (2 mg total) by mouth every 6 (six) hours as needed (Back Muscle Spasm). 30 tablet 3  . traMADol (ULTRAM) 50 MG tablet Take 1 tablet (50 mg total) by mouth every 6 (six) hours as needed. 60 tablet 2  . oxyCODONE (OXY IR/ROXICODONE) 5 MG immediate release tablet Take 1 tablet (5 mg total) by mouth every 6 (six) hours as needed for severe pain. 90 tablet 0  . [DISCONTINUED] buPROPion (WELLBUTRIN XL) 150 MG 24 hr tablet Take 1 tablet (150 mg total) by mouth 2 (two) times daily. 60 tablet 5   No current facility-administered medications for this visit.    PHYSICAL EXAMINATION: ECOG PERFORMANCE STATUS: 1 - Symptomatic but completely ambulatory  Filed Vitals:   07/18/14 0945  BP: 152/66  Pulse: 85  Temp: 97.8 F (36.6 C)  Resp: 18   Filed Weights   07/18/14 0945  Weight: 171 lb 9.6 oz (77.837 kg)    GENERAL:alert, no distress and comfortable SKIN: skin color, texture, turgor are normal, no rashes or significant lesions EYES: normal, Conjunctiva are pink  and non-injected, sclera clear OROPHARYNX:no exudate, no erythema and lips, buccal mucosa, and tongue normal  NECK: supple, thyroid normal size, non-tender, without nodularity LYMPH:  no palpable lymphadenopathy in the cervical, axillary or inguinal LUNGS: clear to auscultation and percussion with normal breathing effort HEART: regular rate & rhythm and no murmurs and no lower extremity edema ABDOMEN:abdomen soft, non-tender and normal bowel sounds Musculoskeletal:no cyanosis of digits and no clubbing  NEURO: alert & oriented x 3 with fluent speech, no focal motor/sensory deficits  LABORATORY DATA:  I have reviewed the data as listed    Component Value Date/Time   NA 142 07/16/2014 1113   NA 141 12/26/2013 0946   NA 143 09/28/2011 0833   K 4.0  07/16/2014 1113   K 4.0 12/26/2013 0946   K 3.5 09/28/2011 0833   CL 106 12/26/2013 0946   CL 102 10/24/2012 1001   CL 98 09/28/2011 0833   CO2 22 07/16/2014 1113   CO2 27 12/26/2013 0946   CO2 30 09/28/2011 0833   GLUCOSE 140 07/16/2014 1113   GLUCOSE 101* 12/26/2013 0946   GLUCOSE 111* 10/24/2012 1001   GLUCOSE 112 09/28/2011 0833   BUN 11.2 07/16/2014 1113   BUN 9 12/26/2013 0946   BUN 9 09/28/2011 0833   CREATININE 0.9 07/16/2014 1113   CREATININE 0.83 12/26/2013 0946   CREATININE 0.7 09/28/2011 0833   CALCIUM 8.7 07/16/2014 1113   CALCIUM 8.8 12/26/2013 0946   CALCIUM 8.7 09/28/2011 0833   PROT 6.5 07/16/2014 1113   PROT 6.8 12/26/2013 0946   PROT 7.2 09/28/2011 0833   ALBUMIN 2.9* 07/16/2014 1113   ALBUMIN 3.8 12/26/2013 0946   AST 11 07/16/2014 1113   AST 12 12/26/2013 0946   AST 15 09/28/2011 0833   ALT 6 07/16/2014 1113   ALT <8 12/26/2013 0946   ALT 16 09/28/2011 0833   ALKPHOS 128 07/16/2014 1113   ALKPHOS 148* 12/26/2013 0946   ALKPHOS 99* 09/28/2011 0833   BILITOT 0.32 07/16/2014 1113   BILITOT 0.6 12/26/2013 0946   BILITOT 0.80 09/28/2011 0833   GFRNONAA 74* 06/12/2013 0625   GFRAA 86* 06/12/2013 0625    No results found for: SPEP, UPEP  Lab Results  Component Value Date   WBC 2.8* 07/16/2014   NEUTROABS 1.7 07/16/2014   HGB 11.1* 07/16/2014   HCT 36.0 07/16/2014   MCV 79.5 07/16/2014   PLT 188 07/16/2014      Chemistry      Component Value Date/Time   NA 142 07/16/2014 1113   NA 141 12/26/2013 0946   NA 143 09/28/2011 0833   K 4.0 07/16/2014 1113   K 4.0 12/26/2013 0946   K 3.5 09/28/2011 0833   CL 106 12/26/2013 0946   CL 102 10/24/2012 1001   CL 98 09/28/2011 0833   CO2 22 07/16/2014 1113   CO2 27 12/26/2013 0946   CO2 30 09/28/2011 0833   BUN 11.2 07/16/2014 1113   BUN 9 12/26/2013 0946   BUN 9 09/28/2011 0833   CREATININE 0.9 07/16/2014 1113   CREATININE 0.83 12/26/2013 0946   CREATININE 0.7 09/28/2011 0833      Component  Value Date/Time   CALCIUM 8.7 07/16/2014 1113   CALCIUM 8.8 12/26/2013 0946   CALCIUM 8.7 09/28/2011 0833   ALKPHOS 128 07/16/2014 1113   ALKPHOS 148* 12/26/2013 0946   ALKPHOS 99* 09/28/2011 0833   AST 11 07/16/2014 1113   AST 12 12/26/2013 0946   AST 15 09/28/2011 0093  ALT 6 07/16/2014 1113   ALT <8 12/26/2013 0946   ALT 16 09/28/2011 0833   BILITOT 0.32 07/16/2014 1113   BILITOT 0.6 12/26/2013 0946   BILITOT 0.80 09/28/2011 0833       RADIOGRAPHIC STUDIES: I reviewed the recent CT scan I have personally reviewed the radiological images as listed and agreed with the findings in the report.   ASSESSMENT & PLAN:  Cancer of upper lobe of left lung  She tolerated treatment well. Recent  Imaging study on 07/16/2014 show regression in size of tumors. After treatment on 07/18/2014, she would proceed to maintenance Alimta without carboplatin every 3 weeks. I will see her every 6 weeks prior to treatment.   Brain metastases treated with radiosurgery Recent MRI is negative for disease recurrence. She is not symptomatic.   TOBACCO ABUSE I spent some time counseling the patient the importance of tobacco cessation. She is currently not interested to quit now.     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 We will discontinue carboplatin as above. She requests a of prescription pain medicine. I will give her prescription oxycodone. I warned her about risk of constipation. We discussed about narcotic refill policy.   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 This was probably predisposed by sciatica on the left foot. I will discontinue carboplatin in the future.    No orders of the defined types were placed in this encounter.   All questions were answered. The patient knows to call the clinic  with any problems, questions or concerns. No barriers to learning was detected. I spent 30 minutes counseling the patient face to face. The total time spent in the appointment was 40 minutes and more than 50% was on counseling and review of test results     [facility], [place], MD 07/19/2014 9:34 AM

## 2014-07-27 ENCOUNTER — Other Ambulatory Visit: Payer: Self-pay

## 2014-07-27 ENCOUNTER — Ambulatory Visit (HOSPITAL_COMMUNITY)
Admission: RE | Admit: 2014-07-27 | Discharge: 2014-07-27 | Disposition: A | Discharge status: Home / Self Care | Payer: Commercial Managed Care - HMO | Source: Ambulatory Visit | Attending: Radiation Oncology | Admitting: Radiation Oncology

## 2014-07-27 DIAGNOSIS — C7931 Secondary malignant neoplasm of brain: Secondary | ICD-10-CM

## 2014-07-27 DIAGNOSIS — E237 Disorder of pituitary gland, unspecified: Secondary | ICD-10-CM | POA: Diagnosis not present

## 2014-07-27 DIAGNOSIS — C3490 Malignant neoplasm of unspecified part of unspecified bronchus or lung: Secondary | ICD-10-CM | POA: Insufficient documentation

## 2014-07-27 MED ORDER — FUROSEMIDE 20 MG PO TABS: 20.0000 mg | ORAL_TABLET | Freq: Every day | ORAL | Status: DC

## 2014-07-27 MED ORDER — GADOBENATE DIMEGLUMINE 529 MG/ML IV SOLN
16.0000 mL | Freq: Once | INTRAVENOUS | Status: AC | PRN
  Administered 2014-07-27: 16 mL via INTRAVENOUS

## 2014-07-27 MED ORDER — PRAVASTATIN SODIUM 40 MG PO TABS: ORAL_TABLET | ORAL | Status: DC

## 2014-07-30 ENCOUNTER — Ambulatory Visit
Admission: RE | Admit: 2014-07-30 | Discharge: 2014-07-30 | Disposition: A | Discharge status: Home / Self Care | Payer: Medicare HMO | Source: Ambulatory Visit | Attending: Radiation Oncology | Admitting: Radiation Oncology

## 2014-07-30 ENCOUNTER — Ambulatory Visit: Payer: Medicare HMO

## 2014-07-30 VITALS — BP 143/68 | HR 85 | Temp 97.9°F | Resp 20 | Wt 170.8 lb

## 2014-07-30 DIAGNOSIS — C7949 Secondary malignant neoplasm of other parts of nervous system: Principal | ICD-10-CM

## 2014-07-30 DIAGNOSIS — C7931 Secondary malignant neoplasm of brain: Secondary | ICD-10-CM

## 2014-07-30 NOTE — Progress Notes (Addendum)
Patient denies pain, HA, nausea, dizziness, unsteadiness, vision changes. She reports that her energy fluctuates daily and she has little appetite , eats one meal a day. She states that she went to ED for pain in her left heel that radiated up to her left knee. She states she was told this was from sciatica. She takes Tramadol prn or Oxy IR 5 mg prn if pain in her back is severe. She states she occasionally has HA that begins at the base of her neck and radiates to the top of her head. She states these occur less than weekly and are caused by stress. She is not taking steroids at this time.

## 2014-07-30 NOTE — Progress Notes (Signed)
Radiation Oncology         [phone] ________________________________  Name: Lauren [name] MRN: [id number]  Date: 07/30/2014  DOB: 10-Sep-1950  Follow-Up Visit Note  Outpatient  CC: Diona Fanti, DO  Katheren Shams, DO  Diagnosis:   Diagnosis and Prior Radiotherapy: Metastatic non-small cell lung cancer, adenocarcinoma, with 2 brain metastases. Left occipital and right insular metastases  Interval Since Last Radiation: She completed 20 Gray in 1 fraction to both of the metastases on 03/01/2012       ICD-9-CM ICD-10-CM   1. Brain metastases treated with radiosurgery 198.3 C79.31    Narrative:   Patient returned for followup today.   Patient denies pain, HA, nausea, dizziness, unsteadiness, vision changes. She reports that her energy fluctuates daily and she has little appetite , eats one meal a day. She states that she went to ED for pain in her left heel that radiated up to her left knee. She states she was told this was from sciatica. She takes Tramadol prn or Oxy IR 5 mg prn if pain in her back is severe. She states she occasionally has HA that begins at the base of her neck and radiates to the top of her head. She states these occur less than weekly and are caused by stress. She is not taking steroids at this time.   Most recent MRI of the brain as reviewed at tumor board today shows no evidence of new or relapsed disease.     Status of systemic system per CT scan shows interval response to chemotherapy. She has stopped carboplatin due to neuropathy but continues Alimta   ALLERGIES:  is allergic to adhesive and lisinopril.  Meds: Current Outpatient Prescriptions  Medication Sig Dispense Refill  . albuterol (PROVENTIL HFA;VENTOLIN HFA) 108 (90 BASE) MCG/ACT inhaler Inhale 1 puff into the lungs 2 (two) times daily. 6.7 g prn  . aspirin 81 MG chewable tablet Chew 81 mg by mouth daily.    . diclofenac sodium (VOLTAREN) 1 % GEL Apply 2 g topically 4 (four) times daily.  As needed for pain 100 g 11  . FLUoxetine (PROZAC) 20 MG tablet Take 1 tablet (20 mg total) by mouth daily. 90 tablet 3  . furosemide (LASIX) 20 MG tablet Take 1 tablet (20 mg total) by mouth daily. 30 tablet 0  . lidocaine-prilocaine (EMLA) cream Apply topically as needed. Apply to porta cath site one hour prior to needle stick. 30 g prn  . mirtazapine (REMERON) 15 MG tablet Take 1 tablet (15 mg total) by mouth at bedtime. 90 tablet 0  . nebivolol (BYSTOLIC) 10 MG tablet Take 1 tablet (10 mg total) by mouth daily. 90 tablet 4  . nitroGLYCERIN (NITROSTAT) 0.4 MG SL tablet Place 1 tablet (0.4 mg total) under the tongue every 5 (five) minutes as needed. For chest pain 10 tablet 0  . omeprazole (PRILOSEC) 40 MG capsule Take 1 capsule (40 mg total) by mouth daily. 90 capsule 3  . ondansetron (ZOFRAN) 8 MG tablet     . oxyCODONE (OXY IR/ROXICODONE) 5 MG immediate release tablet Take 1 tablet (5 mg total) by mouth every 6 (six) hours as needed for severe pain. 90 tablet 0  . potassium chloride SA (K-DUR,KLOR-CON) 20 MEQ tablet Take 1 tablet (20 mEq total) by mouth 2 (two) times daily. 180 tablet 0  . pravastatin (PRAVACHOL) 40 MG tablet TAKE 1 TABLET BY MOUTH ONCE DAILY 30 tablet 0  . prochlorperazine (COMPAZINE) 10 MG tablet     .  tiZANidine (ZANAFLEX) 2 MG tablet Take 1 tablet (2 mg total) by mouth every 6 (six) hours as needed (Back Muscle Spasm). 30 tablet 3  . traMADol (ULTRAM) 50 MG tablet Take 1 tablet (50 mg total) by mouth every 6 (six) hours as needed. 60 tablet 2  . [DISCONTINUED] buPROPion (WELLBUTRIN XL) 150 MG 24 hr tablet Take 1 tablet (150 mg total) by mouth 2 (two) times daily. 60 tablet 5   No current facility-administered medications for this encounter.    Physical Findings: The patient is in no acute distress. Patient is alert and oriented.  weight is 170 lb 12.8 oz (77.474 kg). Her oral temperature is 97.9 F (36.6 C). Her blood pressure is 143/68 and her pulse is 85. Her  respiration is 20 and oxygen saturation is 100%. .   General: Alert and oriented, in no acute distress HEENT: Head is normocephalic.Extraocular movements are intact. Oropharynx is clear. Extremities: No cyanosis or edema of calf tenderness Musculoskeletal: symmetric strength and muscle tone throughout. Neurologic: Cranial nerves II through XII are grossly intact. No obvious focalities. Speech is fluent. Coordination is intact. Psychiatric: Judgment and insight are intact. Affect is appropriate.   Lab Findings: Lab Results  Component Value Date   WBC 2.8* 07/16/2014   HGB 11.1* 07/16/2014   HCT 36.0 07/16/2014   MCV 79.5 07/16/2014   PLT 188 07/16/2014      CBC    Component Value Date/Time   WBC 2.8* 07/16/2014 1112   WBC 6.5 06/12/2013 0625   RBC 4.53 07/16/2014 1112   RBC 5.16* 06/12/2013 0625   HGB 11.1* 07/16/2014 1112   HGB 12.9 06/12/2013 0625   HCT 36.0 07/16/2014 1112   HCT 40.8 06/12/2013 0625   PLT 188 07/16/2014 1112   PLT 202 06/12/2013 0625   MCV 79.5 07/16/2014 1112   MCV 79.1 06/12/2013 0625   MCH 24.5* 07/16/2014 1112   MCH 25.0* 06/12/2013 0625   MCHC 30.8* 07/16/2014 1112   MCHC 31.6 06/12/2013 0625   RDW 19.4* 07/16/2014 1112   RDW 17.3* 06/12/2013 0625   LYMPHSABS 0.8* 07/16/2014 1112   LYMPHSABS 0.8 03/02/2013 0050   MONOABS 0.3 07/16/2014 1112   MONOABS 0.1 03/02/2013 0050   EOSABS 0.1 07/16/2014 1112   EOSABS 0.1 03/02/2013 0050   BASOSABS 0.0 07/16/2014 1112   BASOSABS 0.0 03/02/2013 0050      Radiographic Findings: See above   Impression/Plan:  Doing relatively well.  Followup MRI of brain recommended by me to be done in 4 - 6 mo. She prefers in 11mo.  We will arrange this for her.  I wished her the best until then.  _________________   Eppie Gibson, MD

## 2014-08-01 ENCOUNTER — Other Ambulatory Visit: Payer: Self-pay

## 2014-08-02 ENCOUNTER — Telehealth: Payer: Self-pay | Admitting: *Deleted

## 2014-08-02 NOTE — Telephone Encounter (Signed)
PT. UNDERSTOOD HER TREATMENT WAS EVERY SIX WEEKS BUT HER SCHEDULE HAS EVERY THREE WEEKS. PER DR.GORSUCH'S DICTATION OF 07/18/14 PT.'S TREATMENT IS EVERY THREE WEEKS AND SHE WILL SEE [place] EVERY SIX WEEKS. PT. WAS INFORMED AND VOICES UNDERSTANDING.

## 2014-08-07 ENCOUNTER — Other Ambulatory Visit: Payer: Self-pay | Admitting: Hematology and Oncology

## 2014-08-07 ENCOUNTER — Other Ambulatory Visit: Payer: Self-pay | Admitting: Radiation Therapy

## 2014-08-07 ENCOUNTER — Telehealth: Payer: Self-pay | Admitting: *Deleted

## 2014-08-07 DIAGNOSIS — C7931 Secondary malignant neoplasm of brain: Secondary | ICD-10-CM

## 2014-08-07 DIAGNOSIS — C3412 Malignant neoplasm of upper lobe, left bronchus or lung: Secondary | ICD-10-CM

## 2014-08-07 NOTE — Telephone Encounter (Signed)
Appts scheduled pe Manuela Schwartz in radiation. She will contact the patient

## 2014-08-08 ENCOUNTER — Ambulatory Visit: Payer: Commercial Managed Care - HMO

## 2014-08-08 ENCOUNTER — Ambulatory Visit (HOSPITAL_BASED_OUTPATIENT_CLINIC_OR_DEPARTMENT_OTHER): Payer: Commercial Managed Care - HMO

## 2014-08-08 ENCOUNTER — Other Ambulatory Visit (HOSPITAL_BASED_OUTPATIENT_CLINIC_OR_DEPARTMENT_OTHER): Payer: Commercial Managed Care - HMO

## 2014-08-08 DIAGNOSIS — C3412 Malignant neoplasm of upper lobe, left bronchus or lung: Secondary | ICD-10-CM | POA: Diagnosis not present

## 2014-08-08 DIAGNOSIS — Z5111 Encounter for antineoplastic chemotherapy: Secondary | ICD-10-CM | POA: Diagnosis not present

## 2014-08-08 DIAGNOSIS — C7931 Secondary malignant neoplasm of brain: Secondary | ICD-10-CM

## 2014-08-08 DIAGNOSIS — Z95828 Presence of other vascular implants and grafts: Secondary | ICD-10-CM

## 2014-08-08 LAB — COMPREHENSIVE METABOLIC PANEL (CC13)
ALT: 10 U/L (ref 0–55)
ANION GAP: 10 meq/L (ref 3–11)
AST: 11 U/L (ref 5–34)
Albumin: 2.9 g/dL — ABNORMAL LOW (ref 3.5–5.0)
Alkaline Phosphatase: 130 U/L (ref 40–150)
BILIRUBIN TOTAL: 0.25 mg/dL (ref 0.20–1.20)
BUN: 11.6 mg/dL (ref 7.0–26.0)
CALCIUM: 8.7 mg/dL (ref 8.4–10.4)
CO2: 25 mEq/L (ref 22–29)
Chloride: 109 mEq/L (ref 98–109)
Creatinine: 0.8 mg/dL (ref 0.6–1.1)
EGFR: 87 mL/min/{1.73_m2} — AB (ref >90)
GLUCOSE: 108 mg/dL (ref 70–140)
POTASSIUM: 3.9 meq/L (ref 3.5–5.1)
Sodium: 144 mEq/L (ref 136–145)
TOTAL PROTEIN: 6.7 g/dL (ref 6.4–8.3)

## 2014-08-08 LAB — CBC WITH DIFFERENTIAL/PLATELET
BASO%: 0.7 % (ref 0.0–2.0)
BASOS ABS: 0 10*3/uL (ref 0.0–0.1)
EOS%: 1 % (ref 0.0–7.0)
Eosinophils Absolute: 0 10*3/uL (ref 0.0–0.5)
HCT: 35.1 % (ref 34.8–46.6)
HEMOGLOBIN: 11.1 g/dL — AB (ref 11.6–15.9)
LYMPH#: 0.7 10*3/uL — AB (ref 0.9–3.3)
LYMPH%: 24.3 % (ref 14.0–49.7)
MCH: 24.4 pg — AB (ref 25.1–34.0)
MCHC: 31.5 g/dL (ref 31.5–36.0)
MCV: 77.4 fL — ABNORMAL LOW (ref 79.5–101.0)
MONO#: 0.5 10*3/uL (ref 0.1–0.9)
MONO%: 17.9 % — ABNORMAL HIGH (ref 0.0–14.0)
NEUT#: 1.7 10*3/uL (ref 1.5–6.5)
NEUT%: 56.1 % (ref 38.4–76.8)
Platelets: 205 10*3/uL (ref 145–400)
RBC: 4.54 10*6/uL (ref 3.70–5.45)
RDW: 21.7 % — AB (ref 11.2–14.5)
WBC: 3 10*3/uL — ABNORMAL LOW (ref 3.9–10.3)

## 2014-08-08 MED ORDER — DEXAMETHASONE SODIUM PHOSPHATE 100 MG/10ML IJ SOLN
Freq: Once | INTRAMUSCULAR | Status: AC
  Administered 2014-08-08: 10:00:00 via INTRAVENOUS
  Filled 2014-08-08: qty 4

## 2014-08-08 MED ORDER — SODIUM CHLORIDE 0.9 % IV SOLN
Freq: Once | INTRAVENOUS | Status: AC
  Administered 2014-08-08: 10:00:00 via INTRAVENOUS

## 2014-08-08 MED ORDER — SODIUM CHLORIDE 0.9 % IV SOLN
500.0000 mg/m2 | Freq: Once | INTRAVENOUS | Status: AC
  Administered 2014-08-08: 925 mg via INTRAVENOUS
  Filled 2014-08-08: qty 37

## 2014-08-08 MED ORDER — SODIUM CHLORIDE 0.9 % IJ SOLN
10.0000 mL | INTRAMUSCULAR | Status: DC | PRN
  Administered 2014-08-08: 10 mL via INTRAVENOUS
  Filled 2014-08-08: qty 10

## 2014-08-08 MED ORDER — HEPARIN SOD (PORK) LOCK FLUSH 100 UNIT/ML IV SOLN
500.0000 [IU] | Freq: Once | INTRAVENOUS | Status: AC | PRN
  Administered 2014-08-08: 500 [IU]
  Filled 2014-08-08: qty 5

## 2014-08-08 MED ORDER — CYANOCOBALAMIN 1000 MCG/ML IJ SOLN
INTRAMUSCULAR | Status: AC
  Filled 2014-08-08: qty 1

## 2014-08-08 MED ORDER — CYANOCOBALAMIN 1000 MCG/ML IJ SOLN
1000.0000 ug | Freq: Once | INTRAMUSCULAR | Status: DC
  Administered 2014-08-08: 1000 ug via INTRAMUSCULAR

## 2014-08-08 MED ORDER — SODIUM CHLORIDE 0.9 % IJ SOLN
10.0000 mL | INTRAMUSCULAR | Status: DC | PRN
  Administered 2014-08-08: 10 mL
  Filled 2014-08-08: qty 10

## 2014-08-08 NOTE — Patient Instructions (Signed)
[facility] Discharge Instructions for Patients Receiving Chemotherapy  Today you received the following chemotherapy agents Alimta  To help prevent nausea and vomiting after your treatment, we encourage you to take your nausea medication as prescribed.   If you develop nausea and vomiting that is not controlled by your nausea medication, call the clinic.   BELOW ARE SYMPTOMS THAT SHOULD BE REPORTED IMMEDIATELY:  *FEVER GREATER THAN 100.5 F  *CHILLS WITH OR WITHOUT FEVER  NAUSEA AND VOMITING THAT IS NOT CONTROLLED WITH YOUR NAUSEA MEDICATION  *UNUSUAL SHORTNESS OF BREATH  *UNUSUAL BRUISING OR BLEEDING  TENDERNESS IN MOUTH AND THROAT WITH OR WITHOUT PRESENCE OF ULCERS  *URINARY PROBLEMS  *BOWEL PROBLEMS  UNUSUAL RASH Items with * indicate a potential emergency and should be followed up as soon as possible.  Feel free to call the clinic you have any questions or concerns. The clinic phone number is [phone].

## 2014-08-08 NOTE — Patient Instructions (Signed)

## 2014-08-21 ENCOUNTER — Other Ambulatory Visit: Payer: Self-pay

## 2014-08-21 ENCOUNTER — Telehealth: Payer: Self-pay | Admitting: *Deleted

## 2014-08-21 DIAGNOSIS — Z1231 Encounter for screening mammogram for malignant neoplasm of breast: Secondary | ICD-10-CM

## 2014-08-21 NOTE — Telephone Encounter (Signed)
Pt states she is "more tired being on the Alimta, than I was on 2 drugs". Reports cramping in thighs, calf and left foot. SOB with exertion (shopping). Discussed using muscle relaxant when having cramping. Pt is to see Dr Alvy Bimler 4/13 with labs. Will forward to Dr Alvy Bimler.

## 2014-08-28 ENCOUNTER — Ambulatory Visit
Admission: RE | Admit: 2014-08-28 | Discharge: 2014-08-28 | Disposition: A | Discharge status: Home / Self Care | Payer: Commercial Managed Care - HMO | Source: Ambulatory Visit

## 2014-08-28 DIAGNOSIS — Z1231 Encounter for screening mammogram for malignant neoplasm of breast: Secondary | ICD-10-CM

## 2014-08-29 ENCOUNTER — Other Ambulatory Visit (HOSPITAL_BASED_OUTPATIENT_CLINIC_OR_DEPARTMENT_OTHER): Payer: Commercial Managed Care - HMO

## 2014-08-29 ENCOUNTER — Ambulatory Visit (HOSPITAL_BASED_OUTPATIENT_CLINIC_OR_DEPARTMENT_OTHER): Payer: Commercial Managed Care - HMO

## 2014-08-29 ENCOUNTER — Ambulatory Visit (HOSPITAL_BASED_OUTPATIENT_CLINIC_OR_DEPARTMENT_OTHER): Payer: Commercial Managed Care - HMO | Admitting: Hematology and Oncology

## 2014-08-29 ENCOUNTER — Telehealth: Payer: Self-pay | Admitting: *Deleted

## 2014-08-29 ENCOUNTER — Telehealth: Payer: Self-pay | Admitting: Hematology and Oncology

## 2014-08-29 ENCOUNTER — Encounter: Payer: Self-pay | Admitting: Hematology and Oncology

## 2014-08-29 ENCOUNTER — Ambulatory Visit: Payer: Commercial Managed Care - HMO

## 2014-08-29 VITALS — BP 139/83 | HR 85 | Temp 97.8°F | Resp 18 | Ht 64.0 in | Wt 171.9 lb

## 2014-08-29 VITALS — BP 139/83 | HR 85 | Temp 97.8°F

## 2014-08-29 DIAGNOSIS — D630 Anemia in neoplastic disease: Secondary | ICD-10-CM

## 2014-08-29 DIAGNOSIS — M5442 Lumbago with sciatica, left side: Secondary | ICD-10-CM

## 2014-08-29 DIAGNOSIS — Z95828 Presence of other vascular implants and grafts: Secondary | ICD-10-CM

## 2014-08-29 DIAGNOSIS — T451X5A Adverse effect of antineoplastic and immunosuppressive drugs, initial encounter: Secondary | ICD-10-CM | POA: Insufficient documentation

## 2014-08-29 DIAGNOSIS — Z5111 Encounter for antineoplastic chemotherapy: Secondary | ICD-10-CM | POA: Diagnosis not present

## 2014-08-29 DIAGNOSIS — Z720 Tobacco use: Secondary | ICD-10-CM

## 2014-08-29 DIAGNOSIS — D6481 Anemia due to antineoplastic chemotherapy: Secondary | ICD-10-CM | POA: Insufficient documentation

## 2014-08-29 DIAGNOSIS — C3412 Malignant neoplasm of upper lobe, left bronchus or lung: Secondary | ICD-10-CM

## 2014-08-29 DIAGNOSIS — F17200 Nicotine dependence, unspecified, uncomplicated: Secondary | ICD-10-CM

## 2014-08-29 DIAGNOSIS — Z85038 Personal history of other malignant neoplasm of large intestine: Secondary | ICD-10-CM

## 2014-08-29 DIAGNOSIS — C3400 Malignant neoplasm of unspecified main bronchus: Secondary | ICD-10-CM

## 2014-08-29 HISTORY — DX: Anemia due to antineoplastic chemotherapy: D64.81

## 2014-08-29 HISTORY — DX: Anemia in neoplastic disease: D63.0

## 2014-08-29 LAB — CBC WITH DIFFERENTIAL/PLATELET
BASO%: 1.2 % (ref 0.0–2.0)
Basophils Absolute: 0 10*3/uL (ref 0.0–0.1)
EOS%: 1.3 % (ref 0.0–7.0)
Eosinophils Absolute: 0.1 10*3/uL (ref 0.0–0.5)
HCT: 33.9 % — ABNORMAL LOW (ref 34.8–46.6)
HGB: 10.7 g/dL — ABNORMAL LOW (ref 11.6–15.9)
LYMPH%: 16.7 % (ref 14.0–49.7)
MCH: 24.9 pg — AB (ref 25.1–34.0)
MCHC: 31.7 g/dL (ref 31.5–36.0)
MCV: 78.7 fL — AB (ref 79.5–101.0)
MONO#: 0.6 10*3/uL (ref 0.1–0.9)
MONO%: 14 % (ref 0.0–14.0)
NEUT#: 2.8 10*3/uL (ref 1.5–6.5)
NEUT%: 66.8 % (ref 38.4–76.8)
Platelets: 355 10*3/uL (ref 145–400)
RBC: 4.31 10*6/uL (ref 3.70–5.45)
RDW: 20.7 % — AB (ref 11.2–14.5)
WBC: 4.1 10*3/uL (ref 3.9–10.3)
lymph#: 0.7 10*3/uL — ABNORMAL LOW (ref 0.9–3.3)

## 2014-08-29 LAB — COMPREHENSIVE METABOLIC PANEL (CC13)
ALT: 6 U/L (ref 0–55)
AST: 11 U/L (ref 5–34)
Albumin: 2.8 g/dL — ABNORMAL LOW (ref 3.5–5.0)
Alkaline Phosphatase: 118 U/L (ref 40–150)
Anion Gap: 13 mEq/L — ABNORMAL HIGH (ref 3–11)
BILIRUBIN TOTAL: 0.25 mg/dL (ref 0.20–1.20)
BUN: 9.6 mg/dL (ref 7.0–26.0)
CO2: 23 meq/L (ref 22–29)
CREATININE: 0.8 mg/dL (ref 0.6–1.1)
Calcium: 8.6 mg/dL (ref 8.4–10.4)
Chloride: 108 mEq/L (ref 98–109)
EGFR: 89 mL/min/{1.73_m2} — ABNORMAL LOW (ref >90)
GLUCOSE: 114 mg/dL (ref 70–140)
Potassium: 3.5 mEq/L (ref 3.5–5.1)
Sodium: 144 mEq/L (ref 136–145)
Total Protein: 7.1 g/dL (ref 6.4–8.3)

## 2014-08-29 MED ORDER — SODIUM CHLORIDE 0.9 % IJ SOLN
10.0000 mL | INTRAMUSCULAR | Status: DC | PRN
  Administered 2014-08-29 (×2): 10 mL via INTRAVENOUS
  Filled 2014-08-29: qty 10

## 2014-08-29 MED ORDER — SODIUM CHLORIDE 0.9 % IV SOLN
500.0000 mg/m2 | Freq: Once | INTRAVENOUS | Status: AC
  Administered 2014-08-29: 925 mg via INTRAVENOUS
  Filled 2014-08-29: qty 37

## 2014-08-29 MED ORDER — SODIUM CHLORIDE 0.9 % IV SOLN
Freq: Once | INTRAVENOUS | Status: AC
  Administered 2014-08-29: 11:00:00 via INTRAVENOUS

## 2014-08-29 MED ORDER — LIDOCAINE-PRILOCAINE 2.5-2.5 % EX CREA: TOPICAL_CREAM | CUTANEOUS | Status: DC | PRN

## 2014-08-29 MED ORDER — SODIUM CHLORIDE 0.9 % IJ SOLN
10.0000 mL | INTRAMUSCULAR | Status: DC | PRN
  Filled 2014-08-29: qty 10

## 2014-08-29 MED ORDER — HEPARIN SOD (PORK) LOCK FLUSH 100 UNIT/ML IV SOLN
500.0000 [IU] | Freq: Once | INTRAVENOUS | Status: AC | PRN
  Administered 2014-08-29: 500 [IU]
  Filled 2014-08-29: qty 5

## 2014-08-29 MED ORDER — SODIUM CHLORIDE 0.9 % IV SOLN
Freq: Once | INTRAVENOUS | Status: AC
  Administered 2014-08-29: 11:00:00 via INTRAVENOUS
  Filled 2014-08-29: qty 4

## 2014-08-29 MED ORDER — FOLIC ACID 1 MG PO TABS: 1.0000 mg | ORAL_TABLET | Freq: Every day | ORAL | Status: DC

## 2014-08-29 NOTE — Patient Instructions (Signed)

## 2014-08-29 NOTE — Patient Instructions (Signed)
Pemetrexed injection What is this medicine? PEMETREXED (PEM e TREX ed) is a chemotherapy drug. This medicine affects cells that are rapidly growing, such as cancer cells and cells in your mouth and stomach. It is usually used to treat lung cancers like non-small cell lung cancer and mesothelioma. It may also be used to treat other cancers. This medicine may be used for other purposes; ask your health care provider or pharmacist if you have questions. COMMON BRAND NAME(S): Alimta What should I tell my health care provider before I take this medicine? They need to know if you have any of these conditions: -if you frequently drink alcohol containing beverages -infection (especially a virus infection such as chickenpox, cold sores, or herpes) -kidney disease -liver disease -low blood counts, like low platelets, red bloods, or white blood cells -an unusual or allergic reaction to pemetrexed, mannitol, other medicines, foods, dyes, or preservatives -pregnant or trying to get pregnant -breast-feeding How should I use this medicine? This drug is given as an infusion into a vein. It is administered in a hospital or clinic by a specially trained health care professional. Talk to your pediatrician regarding the use of this medicine in children. Special care may be needed. Overdosage: If you think you have taken too much of this medicine contact a poison control center or emergency room at once. NOTE: This medicine is only for you. Do not share this medicine with others. What if I miss a dose? It is important not to miss your dose. Call your doctor or health care professional if you are unable to keep an appointment. What may interact with this medicine? -aspirin and aspirin-like medicines -medicines to increase blood counts like filgrastim, pegfilgrastim, sargramostim -methotrexate -NSAIDS, medicines for pain and inflammation, like ibuprofen or naproxen -probenecid -pyrimethamine -vaccines Talk to  your doctor or health care professional before taking any of these medicines: -acetaminophen -aspirin -ibuprofen -ketoprofen -naproxen This list may not describe all possible interactions. Give your health care provider a list of all the medicines, herbs, non-prescription drugs, or dietary supplements you use. Also tell them if you smoke, drink alcohol, or use illegal drugs. Some items may interact with your medicine. What should I watch for while using this medicine? Visit your doctor for checks on your progress. This drug may make you feel generally unwell. This is not uncommon, as chemotherapy can affect healthy cells as well as cancer cells. Report any side effects. Continue your course of treatment even though you feel ill unless your doctor tells you to stop. In some cases, you may be given additional medicines to help with side effects. Follow all directions for their use. Call your doctor or health care professional for advice if you get a fever, chills or sore throat, or other symptoms of a cold or flu. Do not treat yourself. This drug decreases your body's ability to fight infections. Try to avoid being around people who are sick. This medicine may increase your risk to bruise or bleed. Call your doctor or health care professional if you notice any unusual bleeding. Be careful brushing and flossing your teeth or using a toothpick because you may get an infection or bleed more easily. If you have any dental work done, tell your dentist you are receiving this medicine. Avoid taking products that contain aspirin, acetaminophen, ibuprofen, naproxen, or ketoprofen unless instructed by your doctor. These medicines may hide a fever. Call your doctor or health care professional if you get diarrhea or mouth sores. Do not treat  yourself. To protect your kidneys, drink water or other fluids as directed while you are taking this medicine. Men and women must use effective birth control while taking this  medicine. You may also need to continue using effective birth control for a time after stopping this medicine. Do not become pregnant while taking this medicine. Tell your doctor right away if you think that you or your partner might be pregnant. There is a potential for serious side effects to an unborn child. Talk to your health care professional or pharmacist for more information. Do not breast-feed an infant while taking this medicine. This medicine may lower sperm counts. What side effects may I notice from receiving this medicine? Side effects that you should report to your doctor or health care professional as soon as possible: -allergic reactions like skin rash, itching or hives, swelling of the face, lips, or tongue -low blood counts - this medicine may decrease the number of white blood cells, red blood cells and platelets. You may be at increased risk for infections and bleeding. -signs of infection - fever or chills, cough, sore throat, pain or difficulty passing urine -signs of decreased platelets or bleeding - bruising, pinpoint red spots on the skin, black, tarry stools, blood in the urine -signs of decreased red blood cells - unusually weak or tired, fainting spells, lightheadedness -breathing problems, like a dry cough -changes in emotions or moods -chest pain -confusion -diarrhea -high blood pressure -mouth or throat sores or ulcers -pain, swelling, warmth in the leg -pain on swallowing -swelling of the ankles, feet, hands -trouble passing urine or change in the amount of urine -vomiting -yellowing of the eyes or skin Side effects that usually do not require medical attention (report to your doctor or health care professional if they continue or are bothersome): -hair loss -loss of appetite -nausea -stomach upset This list may not describe all possible side effects. Call your doctor for medical advice about side effects. You may report side effects to FDA at  1-800-FDA-1088. Where should I keep my medicine? This drug is given in a hospital or clinic and will not be stored at home. NOTE: This sheet is a summary. It may not cover all possible information. If you have questions about this medicine, talk to your doctor, pharmacist, or health care provider.  2015, Elsevier/Gold Standard. (2007-12-06 13:24:03)

## 2014-08-29 NOTE — Assessment & Plan Note (Signed)
Anemia is multi-factorial, could be related to anemia of chronic disease, chemotherapy side effects and possibly iron deficiency anemia I will check iron studies and recommended iron supplement daily. She will continue vitamin B-12 injection every few months and folic acid supplementation.

## 2014-08-29 NOTE — Assessment & Plan Note (Signed)
She tolerated treatment well. Recent  Imaging study on 07/16/2014 show regression in size of tumors. After treatment on 07/18/2014, she is placed on maintenance Alimta without carboplatin every 3 weeks. I will see her every 6 weeks prior to treatment. I plan to order a CT scan of the chest, abdomen and pelvis next month to restage her disease She wants to skip treatment on her birthday and I will delay by 1 week and see her back in June.

## 2014-08-29 NOTE — Telephone Encounter (Signed)
Per staff message and POF I have scheduled appts. Advised scheduler of appts. JMW  

## 2014-08-29 NOTE — Progress Notes (Signed)
[facility] OFFICE PROGRESS NOTE  Patient Care Team: Katheren Shams, DO as PCP - General (Family Medicine) Gaye Pollack, MD (Cardiothoracic Surgery) Thea Silversmith, MD (Radiation Oncology) Dickie La, MD (Family Medicine) Heath Lark, MD as Consulting Physician (Hematology and Oncology)  SUMMARY OF ONCOLOGIC HISTORY: Oncology History   Colon cancer   Primary site: Colon and Rectum (Left)   Staging method: AJCC 7th Edition   Clinical: Stage I (T2, N0, M0) signed by Heath Lark, MD on 05/31/2013  2:39 PM   Pathologic: Stage I (T2, N0, cM0) signed by Heath Lark, MD on 05/31/2013  2:39 PM   Summary: Stage I (T2, N0, cM0) Lung cancer, EGFR/ALK negative, recurrence after initial resection to LN and brain   Primary site: Lung (Left)   Staging method: AJCC 7th Edition   Clinical: Stage IV (T1, N2, M1b) signed by Heath Lark, MD on 05/31/2013  2:26 PM   Pathologic: Stage IV (T1, N2, M1b) signed by Heath Lark, MD on 05/31/2013  2:26 PM   Summary: Stage IV (T1, N2, M1b)       Cancer of upper lobe of left lung   03/01/2007 Procedure Colonoscopy revealed abnormalities and biopsy show high-grade dysplasia   04/01/2007 Surgery She underwent sigmoid resection which showed T2 N0 colon cancer, and negative margins and all of 17 lymph nodes were negative   02/29/2008 Procedure Repeat surveillance colonoscopy was negative.   06/16/2010 Surgery She underwent left upper lobectomy we show well-differentiated adenocarcinoma of the lung, T1, N0, M0   03/18/2011 Procedure Repeat colonoscopy show multiple polyps but there were benign   10/19/2011 Procedure Biopsy of mediastinal lymph node came back positive for recurrence of lung cancer, EGFR and ALK negative   11/16/2011 - 12/14/2011 Chemotherapy She received concurrent chemoradiation therapy with weekly carboplatin and Taxol.   11/16/2011 - 12/29/2011 Radiation Therapy She received radiation therapy with weekly chemotherapy   02/15/2012 Imaging MR of  the brain showed a new intracranial metastases. This was subsequently treated with stereotactic radiosurgery.   03/07/2012 - 04/12/2013 Chemotherapy She received chemotherapy with maintainence Alimta every 3 weeks. Chemotherapy was discontinued due to profound fatigue   03/02/2013 Procedure She had therapeutic ultrasound guidance thoracentesis for pleural effusion that came back negative for cancer   05/04/2013 Procedure She had repeat ultrasound-guided thoracentesis again and cytology was negative   05/29/2013 Imaging Repeat CT scan of the chest, abdomen and pelvis show no evidence of disease but persistent right-sided pleural effusion   06/02/2013 Surgery The patient had placement of Pleurx catheter and subsequently underwent pleurodesis.   09/22/2013 Imaging Repeat CT scan show no evidence of active disease. There are nonspecific lymphadenopathy and she is placed on observation.   03/23/2014 Imaging Repeat CT scan of the chest, abdomen and pelvis show recurrence of cancer with widespread bilateral pulmonary metastasis.   05/14/2014 Imaging Imaging of the chest and brain were repeated due to delay of initiation of chemotherapy. Overall, chest CT scan show stable disease. MRI of the head was negative for recurrence   05/16/2014 - 07/18/2014 Chemotherapy  she completed 4 cycles of combination chemotherapy with carboplatin and Alimta   07/16/2014 Imaging  repeat CT scan of the chest, abdomen and pelvis show regression in the size of lung nodules.    Brain metastases treated with radiosurgery   02/26/2012 Initial Diagnosis Brain metastases treated with radiosurgery    INTERVAL HISTORY: Please see below for problem oriented charting. She returns for further follow-up and to be seen  prior to cycle 6 of Alimta She complained of fatigue. She had mild chronic cough. Continues to have chronic musculoskeletal pain.  REVIEW OF SYSTEMS:   Constitutional: Denies fevers, chills or abnormal weight loss Eyes:  Denies blurriness of vision Ears, nose, mouth, throat, and face: Denies mucositis or sore throat Cardiovascular: Denies palpitation, chest discomfort or lower extremity swelling Gastrointestinal:  Denies nausea, heartburn or change in bowel habits Skin: Denies abnormal skin rashes Lymphatics: Denies new lymphadenopathy or easy bruising Neurological:Denies numbness, tingling or new weaknesses Behavioral/Psych: Mood is stable, no new changes  All other systems were reviewed with the patient and are negative.  I have reviewed the past medical history, past surgical history, social history and family history with the patient and they are unchanged from previous note.  ALLERGIES:  is allergic to adhesive and lisinopril.  MEDICATIONS:  Current Outpatient Prescriptions  Medication Sig Dispense Refill  . albuterol (PROVENTIL HFA;VENTOLIN HFA) 108 (90 BASE) MCG/ACT inhaler Inhale 1 puff into the lungs 2 (two) times daily. 6.7 g prn  . aspirin 81 MG chewable tablet Chew 81 mg by mouth daily.    . diclofenac sodium (VOLTAREN) 1 % GEL Apply 2 g topically 4 (four) times daily. As needed for pain 100 g 11  . FLUoxetine (PROZAC) 20 MG tablet Take 1 tablet (20 mg total) by mouth daily. 90 tablet 3  . furosemide (LASIX) 20 MG tablet Take 1 tablet (20 mg total) by mouth daily. 30 tablet 0  . lidocaine-prilocaine (EMLA) cream Apply topically as needed. Apply to porta cath site one hour prior to needle stick. 30 g 6  . mirtazapine (REMERON) 15 MG tablet Take 1 tablet (15 mg total) by mouth at bedtime. 90 tablet 0  . nebivolol (BYSTOLIC) 10 MG tablet Take 1 tablet (10 mg total) by mouth daily. 90 tablet 4  . nitroGLYCERIN (NITROSTAT) 0.4 MG SL tablet Place 1 tablet (0.4 mg total) under the tongue every 5 (five) minutes as needed. For chest pain 10 tablet 0  . omeprazole (PRILOSEC) 40 MG capsule Take 1 capsule (40 mg total) by mouth daily. 90 capsule 3  . ondansetron (ZOFRAN) 8 MG tablet     . oxyCODONE (OXY  IR/ROXICODONE) 5 MG immediate release tablet Take 1 tablet (5 mg total) by mouth every 6 (six) hours as needed for severe pain. 90 tablet 0  . potassium chloride SA (K-DUR,KLOR-CON) 20 MEQ tablet Take 1 tablet (20 mEq total) by mouth 2 (two) times daily. (Patient taking differently: Take 20 mEq by mouth daily. ) 180 tablet 0  . pravastatin (PRAVACHOL) 40 MG tablet TAKE 1 TABLET BY MOUTH ONCE DAILY 30 tablet 0  . prochlorperazine (COMPAZINE) 10 MG tablet     . tiZANidine (ZANAFLEX) 2 MG tablet Take 1 tablet (2 mg total) by mouth every 6 (six) hours as needed (Back Muscle Spasm). 30 tablet 3  . traMADol (ULTRAM) 50 MG tablet Take 1 tablet (50 mg total) by mouth every 6 (six) hours as needed. 60 tablet 2  . folic acid (FOLVITE) 1 MG tablet Take 1 tablet (1 mg total) by mouth daily. 90 tablet 3  . [DISCONTINUED] buPROPion (WELLBUTRIN XL) 150 MG 24 hr tablet Take 1 tablet (150 mg total) by mouth 2 (two) times daily. 60 tablet 5   No current facility-administered medications for this visit.   Facility-Administered Medications Ordered in Other Visits  Medication Dose Route Frequency Provider Last Rate Last Dose  . heparin lock flush 100 unit/mL  500 Units  Intracatheter Once PRN Heath Lark, MD      . ondansetron (ZOFRAN) 8 mg, dexamethasone (DECADRON) 10 mg in sodium chloride 0.9 % 50 mL IVPB   Intravenous Once Heath Lark, MD      . PEMEtrexed (ALIMTA) 925 mg in sodium chloride 0.9 % 100 mL chemo infusion  500 mg/m2 (Treatment Plan Actual) Intravenous Once Heath Lark, MD      . sodium chloride 0.9 % injection 10 mL  10 mL Intravenous PRN Heath Lark, MD   10 mL at 08/29/14 0941  . sodium chloride 0.9 % injection 10 mL  10 mL Intracatheter PRN Heath Lark, MD        PHYSICAL EXAMINATION: ECOG PERFORMANCE STATUS: 1 - Symptomatic but completely ambulatory  Filed Vitals:   08/29/14 0952  BP: 139/83  Pulse: 85  Temp: 97.8 F (36.6 C)  Resp: 18   Filed Weights   08/29/14 0952  Weight: 171 lb 14.4 oz  (77.973 kg)    GENERAL:alert, no distress and comfortable SKIN: skin color, texture, turgor are normal, no rashes or significant lesions EYES: normal, Conjunctiva are pink and non-injected, sclera clear OROPHARYNX:no exudate, no erythema and lips, buccal mucosa, and tongue normal  NECK: supple, thyroid normal size, non-tender, without nodularity LYMPH:  no palpable lymphadenopathy in the cervical, axillary or inguinal LUNGS: clear to auscultation and percussion with normal breathing effort HEART: regular rate & rhythm and no murmurs and no lower extremity edema ABDOMEN:abdomen soft, non-tender and normal bowel sounds Musculoskeletal:no cyanosis of digits and no clubbing  NEURO: alert & oriented x 3 with fluent speech, no focal motor/sensory deficits  LABORATORY DATA:  I have reviewed the data as listed    Component Value Date/Time   NA 144 08/29/2014 0927   NA 141 12/26/2013 0946   NA 143 09/28/2011 0833   K 3.5 08/29/2014 0927   K 4.0 12/26/2013 0946   K 3.5 09/28/2011 0833   CL 106 12/26/2013 0946   CL 102 10/24/2012 1001   CL 98 09/28/2011 0833   CO2 23 08/29/2014 0927   CO2 27 12/26/2013 0946   CO2 30 09/28/2011 0833   GLUCOSE 114 08/29/2014 0927   GLUCOSE 101* 12/26/2013 0946   GLUCOSE 111* 10/24/2012 1001   GLUCOSE 112 09/28/2011 0833   BUN 9.6 08/29/2014 0927   BUN 9 12/26/2013 0946   BUN 9 09/28/2011 0833   CREATININE 0.8 08/29/2014 0927   CREATININE 0.83 12/26/2013 0946   CREATININE 0.7 09/28/2011 0833   CALCIUM 8.6 08/29/2014 0927   CALCIUM 8.8 12/26/2013 0946   CALCIUM 8.7 09/28/2011 0833   PROT 7.1 08/29/2014 0927   PROT 6.8 12/26/2013 0946   PROT 7.2 09/28/2011 0833   ALBUMIN 2.8* 08/29/2014 0927   ALBUMIN 3.8 12/26/2013 0946   AST 11 08/29/2014 0927   AST 12 12/26/2013 0946   AST 15 09/28/2011 0833   ALT 6 08/29/2014 0927   ALT <8 12/26/2013 0946   ALT 16 09/28/2011 0833   ALKPHOS 118 08/29/2014 0927   ALKPHOS 148* 12/26/2013 0946   ALKPHOS 99*  09/28/2011 0833   BILITOT 0.25 08/29/2014 0927   BILITOT 0.6 12/26/2013 0946   BILITOT 0.80 09/28/2011 0833   GFRNONAA 74* 06/12/2013 0625   GFRAA 86* 06/12/2013 0625    No results found for: SPEP, UPEP  Lab Results  Component Value Date   WBC 4.1 08/29/2014   NEUTROABS 2.8 08/29/2014   HGB 10.7* 08/29/2014   HCT 33.9* 08/29/2014   MCV 78.7*  08/29/2014   PLT 355 08/29/2014      Chemistry      Component Value Date/Time   NA 144 08/29/2014 0927   NA 141 12/26/2013 0946   NA 143 09/28/2011 0833   K 3.5 08/29/2014 0927   K 4.0 12/26/2013 0946   K 3.5 09/28/2011 0833   CL 106 12/26/2013 0946   CL 102 10/24/2012 1001   CL 98 09/28/2011 0833   CO2 23 08/29/2014 0927   CO2 27 12/26/2013 0946   CO2 30 09/28/2011 0833   BUN 9.6 08/29/2014 0927   BUN 9 12/26/2013 0946   BUN 9 09/28/2011 0833   CREATININE 0.8 08/29/2014 0927   CREATININE 0.83 12/26/2013 0946   CREATININE 0.7 09/28/2011 0833      Component Value Date/Time   CALCIUM 8.6 08/29/2014 0927   CALCIUM 8.8 12/26/2013 0946   CALCIUM 8.7 09/28/2011 0833   ALKPHOS 118 08/29/2014 0927   ALKPHOS 148* 12/26/2013 0946   ALKPHOS 99* 09/28/2011 0833   AST 11 08/29/2014 0927   AST 12 12/26/2013 0946   AST 15 09/28/2011 0833   ALT 6 08/29/2014 0927   ALT <8 12/26/2013 0946   ALT 16 09/28/2011 0833   BILITOT 0.25 08/29/2014 0927   BILITOT 0.6 12/26/2013 0946   BILITOT 0.80 09/28/2011 0833       RADIOGRAPHIC STUDIES: I have personally reviewed the radiological images as listed and agreed with the findings in the report. Mm Digital Screening Bilateral  08/28/2014   CLINICAL DATA:  Screening.  EXAM: DIGITAL SCREENING BILATERAL MAMMOGRAM WITH CAD  COMPARISON:  Previous exam(s).  ACR Breast Density Category b: There are scattered areas of fibroglandular density.  FINDINGS: There are no findings suspicious for malignancy. Images were processed with CAD.  IMPRESSION: No mammographic evidence of malignancy. A result letter of  this screening mammogram will be mailed directly to the patient.  RECOMMENDATION: Screening mammogram in one year. (Code:[id number]-[id number])  BI-RADS CATEGORY  1: Negative.   Electronically Signed   By: Claudie Revering M.D.   On: 08/28/2014 16:20     ASSESSMENT & PLAN:  Cancer of upper lobe of left lung  She tolerated treatment well. Recent  Imaging study on 07/16/2014 show regression in size of tumors. After treatment on 07/18/2014, she is placed on maintenance Alimta without carboplatin every 3 weeks. I will see her every 6 weeks prior to treatment. I plan to order a CT scan of the chest, abdomen and pelvis next month to restage her disease She wants to skip treatment on her birthday and I will delay by 1 week and see her back in June.     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 We will discontinue carboplatin as above. I recommend she continues close follow-up with PCP for chronic pain management    TOBACCO ABUSE I spent some time counseling the patient the importance of tobacco cessation. She is currently not interested to quit now.     Anemia in neoplastic disease Anemia is multi-factorial, could be related to anemia of chronic disease, chemotherapy side effects and possibly iron deficiency anemia I will check iron studies and recommended iron supplement daily. She will continue vitamin B-12 injection every few months and folic acid supplementation.    Orders Placed This Encounter  Procedures  . CT Chest W Contrast    Standing Status: Future  Number of Occurrences:      Standing Expiration Date: 10/29/2015    Order Specific Question:  Reason for Exam (SYMPTOM  OR DIAGNOSIS REQUIRED)    Answer:  staging lung ca, assess response to Rx    Order Specific Question:  Preferred imaging location?    Answer:  [facility]  . CT Abdomen  Pelvis W Contrast    Standing Status: Future     Number of Occurrences:      Standing Expiration Date: 11/29/2015    Order Specific Question:  Reason for Exam (SYMPTOM  OR DIAGNOSIS REQUIRED)    Answer:  staging lung ca, assess response to Rx    Order Specific Question:  Preferred imaging location?    Answer:  [facility] ([facility]  . Ferritin    Standing Status: Future     Number of Occurrences:      Standing Expiration Date: 10/03/2015  . Iron and TIBC    Standing Status: Future     Number of Occurrences:      Standing Expiration Date: 10/03/2015   All questions were answered. The patient knows to call the clinic with any problems, questions or concerns. No barriers to learning was detected. I spent 30 minutes counseling the patient face to face. The total time spent in the appointment was 40 minutes and more than 50% was on counseling and review of test results     [facility], [place], MD 08/29/2014 10:52 AM

## 2014-08-29 NOTE — Assessment & Plan Note (Signed)
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 We will discontinue carboplatin as above. I recommend she continues close follow-up with PCP for chronic pain management

## 2014-08-29 NOTE — Telephone Encounter (Signed)
Mailed calendar.

## 2014-08-29 NOTE — Assessment & Plan Note (Signed)
I spent some time counseling the patient the importance of tobacco cessation. She is currently not interested to quit now.

## 2014-09-19 ENCOUNTER — Other Ambulatory Visit (HOSPITAL_BASED_OUTPATIENT_CLINIC_OR_DEPARTMENT_OTHER): Payer: Commercial Managed Care - HMO

## 2014-09-19 ENCOUNTER — Ambulatory Visit: Payer: Commercial Managed Care - HMO

## 2014-09-19 ENCOUNTER — Ambulatory Visit (HOSPITAL_BASED_OUTPATIENT_CLINIC_OR_DEPARTMENT_OTHER): Payer: Commercial Managed Care - HMO

## 2014-09-19 VITALS — BP 143/84 | HR 76 | Temp 98.3°F | Resp 18

## 2014-09-19 DIAGNOSIS — C3412 Malignant neoplasm of upper lobe, left bronchus or lung: Secondary | ICD-10-CM | POA: Diagnosis not present

## 2014-09-19 DIAGNOSIS — Z5111 Encounter for antineoplastic chemotherapy: Secondary | ICD-10-CM

## 2014-09-19 DIAGNOSIS — D630 Anemia in neoplastic disease: Secondary | ICD-10-CM | POA: Diagnosis not present

## 2014-09-19 DIAGNOSIS — Z95828 Presence of other vascular implants and grafts: Secondary | ICD-10-CM

## 2014-09-19 LAB — COMPREHENSIVE METABOLIC PANEL (CC13)
ALK PHOS: 114 U/L (ref 40–150)
ALT: 9 U/L (ref 0–55)
ANION GAP: 11 meq/L (ref 3–11)
AST: 12 U/L (ref 5–34)
Albumin: 2.9 g/dL — ABNORMAL LOW (ref 3.5–5.0)
BUN: 10.5 mg/dL (ref 7.0–26.0)
CO2: 24 mEq/L (ref 22–29)
Calcium: 8.9 mg/dL (ref 8.4–10.4)
Chloride: 108 mEq/L (ref 98–109)
Creatinine: 0.9 mg/dL (ref 0.6–1.1)
EGFR: 80 mL/min/{1.73_m2} — ABNORMAL LOW (ref >90)
GLUCOSE: 106 mg/dL (ref 70–140)
POTASSIUM: 3.8 meq/L (ref 3.5–5.1)
SODIUM: 143 meq/L (ref 136–145)
Total Bilirubin: 0.29 mg/dL (ref 0.20–1.20)
Total Protein: 7.1 g/dL (ref 6.4–8.3)

## 2014-09-19 LAB — CBC WITH DIFFERENTIAL/PLATELET
BASO%: 0.8 % (ref 0.0–2.0)
Basophils Absolute: 0 10*3/uL (ref 0.0–0.1)
EOS ABS: 0.1 10*3/uL (ref 0.0–0.5)
EOS%: 1.8 % (ref 0.0–7.0)
HCT: 33.7 % — ABNORMAL LOW (ref 34.8–46.6)
HGB: 10.7 g/dL — ABNORMAL LOW (ref 11.6–15.9)
LYMPH%: 17.2 % (ref 14.0–49.7)
MCH: 25.1 pg (ref 25.1–34.0)
MCHC: 31.8 g/dL (ref 31.5–36.0)
MCV: 79.2 fL — ABNORMAL LOW (ref 79.5–101.0)
MONO#: 0.5 10*3/uL (ref 0.1–0.9)
MONO%: 12.7 % (ref 0.0–14.0)
NEUT%: 67.5 % (ref 38.4–76.8)
NEUTROS ABS: 2.8 10*3/uL (ref 1.5–6.5)
PLATELETS: 290 10*3/uL (ref 145–400)
RBC: 4.26 10*6/uL (ref 3.70–5.45)
RDW: 21.3 % — ABNORMAL HIGH (ref 11.2–14.5)
WBC: 4.1 10*3/uL (ref 3.9–10.3)
lymph#: 0.7 10*3/uL — ABNORMAL LOW (ref 0.9–3.3)

## 2014-09-19 LAB — IRON AND TIBC CHCC
%SAT: 16 % — ABNORMAL LOW (ref 21–57)
IRON: 40 ug/dL — AB (ref 41–142)
TIBC: 255 ug/dL (ref 236–444)
UIBC: 215 ug/dL (ref 120–384)

## 2014-09-19 LAB — FERRITIN CHCC: Ferritin: 258 ng/ml (ref 9–269)

## 2014-09-19 MED ORDER — PEMETREXED DISODIUM CHEMO INJECTION 500 MG
500.0000 mg/m2 | Freq: Once | INTRAVENOUS | Status: AC
  Administered 2014-09-19: 925 mg via INTRAVENOUS
  Filled 2014-09-19: qty 37

## 2014-09-19 MED ORDER — SODIUM CHLORIDE 0.9 % IJ SOLN
10.0000 mL | INTRAMUSCULAR | Status: DC | PRN
  Administered 2014-09-19: 10 mL via INTRAVENOUS
  Filled 2014-09-19: qty 10

## 2014-09-19 MED ORDER — SODIUM CHLORIDE 0.9 % IV SOLN
Freq: Once | INTRAVENOUS | Status: AC
  Administered 2014-09-19: 11:00:00 via INTRAVENOUS

## 2014-09-19 MED ORDER — HEPARIN SOD (PORK) LOCK FLUSH 100 UNIT/ML IV SOLN
500.0000 [IU] | Freq: Once | INTRAVENOUS | Status: AC | PRN
  Administered 2014-09-19: 500 [IU]
  Filled 2014-09-19: qty 5

## 2014-09-19 MED ORDER — DEXAMETHASONE SODIUM PHOSPHATE 100 MG/10ML IJ SOLN
Freq: Once | INTRAMUSCULAR | Status: AC
  Administered 2014-09-19: 11:00:00 via INTRAVENOUS
  Filled 2014-09-19: qty 4

## 2014-09-19 MED ORDER — SODIUM CHLORIDE 0.9 % IJ SOLN
10.0000 mL | INTRAMUSCULAR | Status: DC | PRN
  Administered 2014-09-19: 10 mL
  Filled 2014-09-19: qty 10

## 2014-09-19 NOTE — Progress Notes (Signed)
Per Dr.Gorsuch, proceed with treatment based on last CMET

## 2014-09-19 NOTE — Patient Instructions (Signed)
[facility] Discharge Instructions for Patients Receiving Chemotherapy  Today you received the following chemotherapy agents Alimta  To help prevent nausea and vomiting after your treatment, we encourage you to take your nausea medication as needed   If you develop nausea and vomiting that is not controlled by your nausea medication, call the clinic.   BELOW ARE SYMPTOMS THAT SHOULD BE REPORTED IMMEDIATELY:  *FEVER GREATER THAN 100.5 F  *CHILLS WITH OR WITHOUT FEVER  NAUSEA AND VOMITING THAT IS NOT CONTROLLED WITH YOUR NAUSEA MEDICATION  *UNUSUAL SHORTNESS OF BREATH  *UNUSUAL BRUISING OR BLEEDING  TENDERNESS IN MOUTH AND THROAT WITH OR WITHOUT PRESENCE OF ULCERS  *URINARY PROBLEMS  *BOWEL PROBLEMS  UNUSUAL RASH Items with * indicate a potential emergency and should be followed up as soon as possible.  Feel free to call the clinic you have any questions or concerns. The clinic phone number is [phone].  Please show the CHEMO ALERT CARD at check-in to the Emergency Department and triage nurse.   

## 2014-09-19 NOTE — Patient Instructions (Signed)

## 2014-10-10 NOTE — Patient Outreach (Signed)
[facility]) Care Management  10/10/2014  Lauren [name] [birth year] [id number]   Referral from [place], assigned [name], RN.  [name]. [place], [place] Management [place] Assistant Phone: [phone] Fax: [phone]

## 2014-10-16 ENCOUNTER — Encounter (HOSPITAL_COMMUNITY): Payer: Self-pay

## 2014-10-16 ENCOUNTER — Other Ambulatory Visit (HOSPITAL_BASED_OUTPATIENT_CLINIC_OR_DEPARTMENT_OTHER): Payer: Commercial Managed Care - HMO

## 2014-10-16 ENCOUNTER — Ambulatory Visit (HOSPITAL_COMMUNITY)
Admission: RE | Admit: 2014-10-16 | Discharge: 2014-10-16 | Disposition: A | Discharge status: Home / Self Care | Payer: Commercial Managed Care - HMO | Source: Ambulatory Visit | Attending: Hematology and Oncology | Admitting: Hematology and Oncology

## 2014-10-16 ENCOUNTER — Ambulatory Visit (HOSPITAL_BASED_OUTPATIENT_CLINIC_OR_DEPARTMENT_OTHER): Payer: Commercial Managed Care - HMO

## 2014-10-16 DIAGNOSIS — R918 Other nonspecific abnormal finding of lung field: Secondary | ICD-10-CM | POA: Diagnosis not present

## 2014-10-16 DIAGNOSIS — I2510 Atherosclerotic heart disease of native coronary artery without angina pectoris: Secondary | ICD-10-CM | POA: Insufficient documentation

## 2014-10-16 DIAGNOSIS — C3412 Malignant neoplasm of upper lobe, left bronchus or lung: Secondary | ICD-10-CM

## 2014-10-16 DIAGNOSIS — I77811 Abdominal aortic ectasia: Secondary | ICD-10-CM | POA: Diagnosis not present

## 2014-10-16 DIAGNOSIS — M4807 Spinal stenosis, lumbosacral region: Secondary | ICD-10-CM | POA: Insufficient documentation

## 2014-10-16 DIAGNOSIS — Z95828 Presence of other vascular implants and grafts: Secondary | ICD-10-CM

## 2014-10-16 DIAGNOSIS — K760 Fatty (change of) liver, not elsewhere classified: Secondary | ICD-10-CM | POA: Diagnosis not present

## 2014-10-16 LAB — CBC WITH DIFFERENTIAL/PLATELET
BASO%: 0.2 % (ref 0.0–2.0)
BASOS ABS: 0 10*3/uL (ref 0.0–0.1)
EOS%: 1.8 % (ref 0.0–7.0)
Eosinophils Absolute: 0.1 10*3/uL (ref 0.0–0.5)
HCT: 35.5 % (ref 34.8–46.6)
HEMOGLOBIN: 11 g/dL — AB (ref 11.6–15.9)
LYMPH%: 20.6 % (ref 14.0–49.7)
MCH: 26.3 pg (ref 25.1–34.0)
MCHC: 31 g/dL — ABNORMAL LOW (ref 31.5–36.0)
MCV: 84.9 fL (ref 79.5–101.0)
MONO#: 0.6 10*3/uL (ref 0.1–0.9)
MONO%: 11.1 % (ref 0.0–14.0)
NEUT%: 66.3 % (ref 38.4–76.8)
NEUTROS ABS: 3.3 10*3/uL (ref 1.5–6.5)
PLATELETS: 213 10*3/uL (ref 145–400)
RBC: 4.18 10*6/uL (ref 3.70–5.45)
RDW: 20.1 % — AB (ref 11.2–14.5)
WBC: 5 10*3/uL (ref 3.9–10.3)
lymph#: 1 10*3/uL (ref 0.9–3.3)

## 2014-10-16 LAB — COMPREHENSIVE METABOLIC PANEL (CC13)
ALT: 13 U/L (ref 0–55)
ANION GAP: 9 meq/L (ref 3–11)
AST: 20 U/L (ref 5–34)
Albumin: 2.8 g/dL — ABNORMAL LOW (ref 3.5–5.0)
Alkaline Phosphatase: 117 U/L (ref 40–150)
BILIRUBIN TOTAL: 0.38 mg/dL (ref 0.20–1.20)
BUN: 9.9 mg/dL (ref 7.0–26.0)
CO2: 27 meq/L (ref 22–29)
Calcium: 8.6 mg/dL (ref 8.4–10.4)
Chloride: 106 mEq/L (ref 98–109)
Creatinine: 0.9 mg/dL (ref 0.6–1.1)
EGFR: 76 mL/min/{1.73_m2} — AB (ref >90)
GLUCOSE: 99 mg/dL (ref 70–140)
Potassium: 3.8 mEq/L (ref 3.5–5.1)
Sodium: 142 mEq/L (ref 136–145)
Total Protein: 7 g/dL (ref 6.4–8.3)

## 2014-10-16 MED ORDER — SODIUM CHLORIDE 0.9 % IJ SOLN
10.0000 mL | INTRAMUSCULAR | Status: DC | PRN
  Administered 2014-10-16: 10 mL via INTRAVENOUS
  Filled 2014-10-16: qty 10

## 2014-10-16 MED ORDER — IOHEXOL 300 MG/ML  SOLN
50.0000 mL | Freq: Once | INTRAMUSCULAR | Status: AC | PRN
  Administered 2014-10-16: 50 mL via ORAL

## 2014-10-16 MED ORDER — IOHEXOL 300 MG/ML  SOLN
100.0000 mL | Freq: Once | INTRAMUSCULAR | Status: AC | PRN
  Administered 2014-10-16: 100 mL via INTRAVENOUS

## 2014-10-16 MED ORDER — HEPARIN SOD (PORK) LOCK FLUSH 100 UNIT/ML IV SOLN
500.0000 [IU] | Freq: Once | INTRAVENOUS | Status: AC
  Administered 2014-10-16: 500 [IU] via INTRAVENOUS
  Filled 2014-10-16: qty 5

## 2014-10-16 NOTE — Patient Instructions (Signed)

## 2014-10-17 ENCOUNTER — Encounter: Payer: Self-pay | Admitting: Hematology and Oncology

## 2014-10-17 ENCOUNTER — Telehealth: Payer: Self-pay | Admitting: *Deleted

## 2014-10-17 ENCOUNTER — Ambulatory Visit (HOSPITAL_BASED_OUTPATIENT_CLINIC_OR_DEPARTMENT_OTHER): Payer: Commercial Managed Care - HMO

## 2014-10-17 ENCOUNTER — Ambulatory Visit (HOSPITAL_BASED_OUTPATIENT_CLINIC_OR_DEPARTMENT_OTHER): Payer: Commercial Managed Care - HMO | Admitting: Hematology and Oncology

## 2014-10-17 ENCOUNTER — Telehealth: Payer: Self-pay | Admitting: Hematology and Oncology

## 2014-10-17 VITALS — BP 144/76 | HR 87 | Temp 98.3°F | Resp 18 | Ht 64.0 in | Wt 168.9 lb

## 2014-10-17 DIAGNOSIS — D630 Anemia in neoplastic disease: Secondary | ICD-10-CM

## 2014-10-17 DIAGNOSIS — C7931 Secondary malignant neoplasm of brain: Secondary | ICD-10-CM | POA: Diagnosis not present

## 2014-10-17 DIAGNOSIS — C3412 Malignant neoplasm of upper lobe, left bronchus or lung: Secondary | ICD-10-CM

## 2014-10-17 DIAGNOSIS — R600 Localized edema: Secondary | ICD-10-CM

## 2014-10-17 DIAGNOSIS — C7949 Secondary malignant neoplasm of other parts of nervous system: Secondary | ICD-10-CM

## 2014-10-17 DIAGNOSIS — Z5111 Encounter for antineoplastic chemotherapy: Secondary | ICD-10-CM | POA: Diagnosis not present

## 2014-10-17 DIAGNOSIS — Z720 Tobacco use: Secondary | ICD-10-CM

## 2014-10-17 DIAGNOSIS — F17200 Nicotine dependence, unspecified, uncomplicated: Secondary | ICD-10-CM

## 2014-10-17 HISTORY — DX: Localized edema: R60.0

## 2014-10-17 MED ORDER — SODIUM CHLORIDE 0.9 % IV SOLN
500.0000 mg/m2 | Freq: Once | INTRAVENOUS | Status: AC
  Administered 2014-10-17: 925 mg via INTRAVENOUS
  Filled 2014-10-17: qty 37

## 2014-10-17 MED ORDER — CYANOCOBALAMIN 1000 MCG/ML IJ SOLN
INTRAMUSCULAR | Status: AC
  Filled 2014-10-17: qty 1

## 2014-10-17 MED ORDER — FOLIC ACID 1 MG PO TABS: 1.0000 mg | ORAL_TABLET | Freq: Every day | ORAL | Status: DC

## 2014-10-17 MED ORDER — SODIUM CHLORIDE 0.9 % IV SOLN
Freq: Once | INTRAVENOUS | Status: AC
  Administered 2014-10-17: 11:00:00 via INTRAVENOUS

## 2014-10-17 MED ORDER — SODIUM CHLORIDE 0.9 % IJ SOLN
10.0000 mL | INTRAMUSCULAR | Status: DC | PRN
  Administered 2014-10-17: 10 mL
  Filled 2014-10-17: qty 10

## 2014-10-17 MED ORDER — CYANOCOBALAMIN 1000 MCG/ML IJ SOLN
1000.0000 ug | Freq: Once | INTRAMUSCULAR | Status: AC
  Administered 2014-10-17: 1000 ug via INTRAMUSCULAR

## 2014-10-17 MED ORDER — HEPARIN SOD (PORK) LOCK FLUSH 100 UNIT/ML IV SOLN
500.0000 [IU] | Freq: Once | INTRAVENOUS | Status: AC | PRN
  Administered 2014-10-17: 500 [IU]
  Filled 2014-10-17: qty 5

## 2014-10-17 MED ORDER — SODIUM CHLORIDE 0.9 % IV SOLN
Freq: Once | INTRAVENOUS | Status: AC
  Administered 2014-10-17: 12:00:00 via INTRAVENOUS
  Filled 2014-10-17: qty 4

## 2014-10-17 NOTE — Assessment & Plan Note (Signed)
She has bilateral lower extremity edema, left greater than the right. This is likely related to low albumin with poor circulation. She will continue intermittently doses of Lasix as tolerated.

## 2014-10-17 NOTE — Assessment & Plan Note (Signed)
I spent some time counseling the patient the importance of tobacco cessation. She is currently not interested to quit now. 

## 2014-10-17 NOTE — Assessment & Plan Note (Signed)
Anemia is multi-factorial, could be related to anemia of chronic disease & chemotherapy side effects. Iron study in May 2016 is adequate She is not symptomatic. She will continue vitamin B-12 injection every few months and folic acid supplementation.

## 2014-10-17 NOTE — Patient Instructions (Signed)
[facility] Discharge Instructions for Patients Receiving Chemotherapy  Today you received the following chemotherapy agents:  Alimta  To help prevent nausea and vomiting after your treatment, we encourage you to take your nausea medication as ordered per MD.   If you develop nausea and vomiting that is not controlled by your nausea medication, call the clinic.   BELOW ARE SYMPTOMS THAT SHOULD BE REPORTED IMMEDIATELY:  *FEVER GREATER THAN 100.5 F  *CHILLS WITH OR WITHOUT FEVER  NAUSEA AND VOMITING THAT IS NOT CONTROLLED WITH YOUR NAUSEA MEDICATION  *UNUSUAL SHORTNESS OF BREATH  *UNUSUAL BRUISING OR BLEEDING  TENDERNESS IN MOUTH AND THROAT WITH OR WITHOUT PRESENCE OF ULCERS  *URINARY PROBLEMS  *BOWEL PROBLEMS  UNUSUAL RASH Items with * indicate a potential emergency and should be followed up as soon as possible.  Feel free to call the clinic you have any questions or concerns. The clinic phone number is [phone].  Please show the CHEMO ALERT CARD at check-in to the Emergency Department and triage nurse.   

## 2014-10-17 NOTE — Progress Notes (Signed)
[place] OFFICE PROGRESS NOTE  Patient Care Team: Willeen Niece, MD as PCP - General (Family Medicine) Gaye Pollack, MD (Cardiothoracic Surgery) Thea Silversmith, MD (Radiation Oncology) Dickie La, MD (Family Medicine) Heath Lark, MD as Consulting Physician (Hematology and Oncology)  SUMMARY OF ONCOLOGIC HISTORY: Oncology History   Colon cancer   Primary site: Colon and Rectum (Left)   Staging method: AJCC 7th Edition   Clinical: Stage I (T2, N0, M0) signed by Heath Lark, MD on 05/31/2013  2:39 PM   Pathologic: Stage I (T2, N0, cM0) signed by Heath Lark, MD on 05/31/2013  2:39 PM   Summary: Stage I (T2, N0, cM0) Lung cancer, EGFR/ALK negative, recurrence after initial resection to LN and brain   Primary site: Lung (Left)   Staging method: AJCC 7th Edition   Clinical: Stage IV (T1, N2, M1b) signed by Heath Lark, MD on 05/31/2013  2:26 PM   Pathologic: Stage IV (T1, N2, M1b) signed by Heath Lark, MD on 05/31/2013  2:26 PM   Summary: Stage IV (T1, N2, M1b)       Cancer of upper lobe of left lung   03/01/2007 Procedure Colonoscopy revealed abnormalities and biopsy show high-grade dysplasia   04/01/2007 Surgery She underwent sigmoid resection which showed T2 N0 colon cancer, and negative margins and all of 17 lymph nodes were negative   02/29/2008 Procedure Repeat surveillance colonoscopy was negative.   06/16/2010 Surgery She underwent left upper lobectomy we show well-differentiated adenocarcinoma of the lung, T1, N0, M0   03/18/2011 Procedure Repeat colonoscopy show multiple polyps but there were benign   10/19/2011 Procedure Biopsy of mediastinal lymph node came back positive for recurrence of lung cancer, EGFR and ALK negative   11/16/2011 - 12/14/2011 Chemotherapy She received concurrent chemoradiation therapy with weekly carboplatin and Taxol.   11/16/2011 - 12/29/2011 Radiation Therapy She received radiation therapy with weekly chemotherapy   02/15/2012 Imaging MR of the  brain showed a new intracranial metastases. This was subsequently treated with stereotactic radiosurgery.   03/07/2012 - 04/12/2013 Chemotherapy She received chemotherapy with maintainence Alimta every 3 weeks. Chemotherapy was discontinued due to profound fatigue   03/02/2013 Procedure She had therapeutic ultrasound guidance thoracentesis for pleural effusion that came back negative for cancer   05/04/2013 Procedure She had repeat ultrasound-guided thoracentesis again and cytology was negative   05/29/2013 Imaging Repeat CT scan of the chest, abdomen and pelvis show no evidence of disease but persistent right-sided pleural effusion   06/02/2013 Surgery The patient had placement of Pleurx catheter and subsequently underwent pleurodesis.   09/22/2013 Imaging Repeat CT scan show no evidence of active disease. There are nonspecific lymphadenopathy and she is placed on observation.   03/23/2014 Imaging Repeat CT scan of the chest, abdomen and pelvis show recurrence of cancer with widespread bilateral pulmonary metastasis.   05/14/2014 Imaging Imaging of the chest and brain were repeated due to delay of initiation of chemotherapy. Overall, chest CT scan show stable disease. MRI of the head was negative for recurrence   05/16/2014 - 07/18/2014 Chemotherapy  she completed 4 cycles of combination chemotherapy with carboplatin and Alimta   07/16/2014 Imaging  repeat CT scan of the chest, abdomen and pelvis show regression in the size of lung nodules.   10/16/2014 Imaging  repeat CT scan of the chest, abdomen and pelvis show regression in the size of lung nodules.    Brain metastases treated with radiosurgery   02/26/2012 Initial Diagnosis Brain metastases treated with  radiosurgery    INTERVAL HISTORY: Please see below for problem oriented charting. She returns for further follow-up. Her only complaint is mild bilateral leg edema. Otherwise she tolerated treatment well. Denies recent chest pain, shortness of  breath or cough. No recent infection.  REVIEW OF SYSTEMS:   Constitutional: Denies fevers, chills or abnormal weight loss Eyes: Denies blurriness of vision Ears, nose, mouth, throat, and face: Denies mucositis or sore throat Respiratory: Denies cough, dyspnea or wheezes Cardiovascular: Denies palpitation, chest discomfort  Gastrointestinal:  Denies nausea, heartburn or change in bowel habits Skin: Denies abnormal skin rashes Lymphatics: Denies new lymphadenopathy or easy bruising Neurological:Denies numbness, tingling or new weaknesses Behavioral/Psych: Mood is stable, no new changes  All other systems were reviewed with the patient and are negative.  I have reviewed the past medical history, past surgical history, social history and family history with the patient and they are unchanged from previous note.  ALLERGIES:  is allergic to adhesive and lisinopril.  MEDICATIONS:  Current Outpatient Prescriptions  Medication Sig Dispense Refill  . albuterol (PROVENTIL HFA;VENTOLIN HFA) 108 (90 BASE) MCG/ACT inhaler Inhale 1 puff into the lungs 2 (two) times daily. 6.7 g prn  . aspirin 81 MG chewable tablet Chew 81 mg by mouth daily.    . diclofenac sodium (VOLTAREN) 1 % GEL Apply 2 g topically 4 (four) times daily. As needed for pain 100 g 11  . FLUoxetine (PROZAC) 20 MG tablet Take 1 tablet (20 mg total) by mouth daily. 90 tablet 3  . folic acid (FOLVITE) 1 MG tablet Take 1 tablet (1 mg total) by mouth daily. 90 tablet 3  . furosemide (LASIX) 20 MG tablet Take 1 tablet (20 mg total) by mouth daily. 30 tablet 0  . lidocaine-prilocaine (EMLA) cream Apply topically as needed. Apply to porta cath site one hour prior to needle stick. 30 g 6  . mirtazapine (REMERON) 15 MG tablet Take 1 tablet (15 mg total) by mouth at bedtime. 90 tablet 0  . nebivolol (BYSTOLIC) 10 MG tablet Take 1 tablet (10 mg total) by mouth daily. 90 tablet 4  . nitroGLYCERIN (NITROSTAT) 0.4 MG SL tablet Place 1 tablet (0.4 mg  total) under the tongue every 5 (five) minutes as needed. For chest pain 10 tablet 0  . omeprazole (PRILOSEC) 40 MG capsule Take 1 capsule (40 mg total) by mouth daily. 90 capsule 3  . ondansetron (ZOFRAN) 8 MG tablet     . oxyCODONE (OXY IR/ROXICODONE) 5 MG immediate release tablet Take 1 tablet (5 mg total) by mouth every 6 (six) hours as needed for severe pain. 90 tablet 0  . potassium chloride SA (K-DUR,KLOR-CON) 20 MEQ tablet Take 1 tablet (20 mEq total) by mouth 2 (two) times daily. (Patient taking differently: Take 20 mEq by mouth daily. ) 180 tablet 0  . pravastatin (PRAVACHOL) 40 MG tablet TAKE 1 TABLET BY MOUTH ONCE DAILY 30 tablet 0  . prochlorperazine (COMPAZINE) 10 MG tablet     . tiZANidine (ZANAFLEX) 2 MG tablet Take 1 tablet (2 mg total) by mouth every 6 (six) hours as needed (Back Muscle Spasm). 30 tablet 3  . traMADol (ULTRAM) 50 MG tablet Take 1 tablet (50 mg total) by mouth every 6 (six) hours as needed. 60 tablet 2  . [DISCONTINUED] buPROPion (WELLBUTRIN XL) 150 MG 24 hr tablet Take 1 tablet (150 mg total) by mouth 2 (two) times daily. 60 tablet 5   No current facility-administered medications for this visit.   Facility-Administered  Medications Ordered in Other Visits  Medication Dose Route Frequency Provider Last Rate Last Dose  . heparin lock flush 100 unit/mL  500 Units Intracatheter Once PRN Heath Lark, MD      . ondansetron (ZOFRAN) 8 mg, dexamethasone (DECADRON) 10 mg in sodium chloride 0.9 % 50 mL IVPB   Intravenous Once Heath Lark, MD      . PEMEtrexed (ALIMTA) 925 mg in sodium chloride 0.9 % 100 mL chemo infusion  500 mg/m2 (Treatment Plan Actual) Intravenous Once Heath Lark, MD      . sodium chloride 0.9 % injection 10 mL  10 mL Intracatheter PRN Heath Lark, MD        PHYSICAL EXAMINATION: ECOG PERFORMANCE STATUS: 0 - Asymptomatic  Filed Vitals:   10/17/14 0955  BP: 144/76  Pulse: 87  Temp: 98.3 F (36.8 C)  Resp: 18   Filed Weights   10/17/14 0955   Weight: 168 lb 14.4 oz (76.613 kg)    GENERAL:alert, no distress and comfortable SKIN: skin color, texture, turgor are normal, no rashes or significant lesions EYES: normal, Conjunctiva are pink and non-injected, sclera clear OROPHARYNX:no exudate, no erythema and lips, buccal mucosa, and tongue normal  NECK: supple, thyroid normal size, non-tender, without nodularity LYMPH:  no palpable lymphadenopathy in the cervical, axillary or inguinal LUNGS: clear to auscultation and percussion with normal breathing effort HEART: regular rate & rhythm and no murmurs with mild bilateral lower extremity edema ABDOMEN:abdomen soft, non-tender and normal bowel sounds Musculoskeletal:no cyanosis of digits and no clubbing  NEURO: alert & oriented x 3 with fluent speech, no focal motor/sensory deficits  LABORATORY DATA:  I have reviewed the data as listed    Component Value Date/Time   NA 142 10/16/2014 0926   NA 141 12/26/2013 0946   NA 143 09/28/2011 0833   K 3.8 10/16/2014 0926   K 4.0 12/26/2013 0946   K 3.5 09/28/2011 0833   CL 106 12/26/2013 0946   CL 102 10/24/2012 1001   CL 98 09/28/2011 0833   CO2 27 10/16/2014 0926   CO2 27 12/26/2013 0946   CO2 30 09/28/2011 0833   GLUCOSE 99 10/16/2014 0926   GLUCOSE 101* 12/26/2013 0946   GLUCOSE 111* 10/24/2012 1001   GLUCOSE 112 09/28/2011 0833   BUN 9.9 10/16/2014 0926   BUN 9 12/26/2013 0946   BUN 9 09/28/2011 0833   CREATININE 0.9 10/16/2014 0926   CREATININE 0.83 12/26/2013 0946   CREATININE 0.7 09/28/2011 0833   CALCIUM 8.6 10/16/2014 0926   CALCIUM 8.8 12/26/2013 0946   CALCIUM 8.7 09/28/2011 0833   PROT 7.0 10/16/2014 0926   PROT 6.8 12/26/2013 0946   PROT 7.2 09/28/2011 0833   ALBUMIN 2.8* 10/16/2014 0926   ALBUMIN 3.8 12/26/2013 0946   AST 20 10/16/2014 0926   AST 12 12/26/2013 0946   AST 15 09/28/2011 0833   ALT 13 10/16/2014 0926   ALT <8 12/26/2013 0946   ALT 16 09/28/2011 0833   ALKPHOS 117 10/16/2014 0926   ALKPHOS  148* 12/26/2013 0946   ALKPHOS 99* 09/28/2011 0833   BILITOT 0.38 10/16/2014 0926   BILITOT 0.6 12/26/2013 0946   BILITOT 0.80 09/28/2011 0833   GFRNONAA 74* 06/12/2013 0625   GFRAA 86* 06/12/2013 0625    No results found for: SPEP, UPEP  Lab Results  Component Value Date   WBC 5.0 10/16/2014   NEUTROABS 3.3 10/16/2014   HGB 11.0* 10/16/2014   HCT 35.5 10/16/2014   MCV 84.9 10/16/2014  PLT 213 10/16/2014      Chemistry      Component Value Date/Time   NA 142 10/16/2014 0926   NA 141 12/26/2013 0946   NA 143 09/28/2011 0833   K 3.8 10/16/2014 0926   K 4.0 12/26/2013 0946   K 3.5 09/28/2011 0833   CL 106 12/26/2013 0946   CL 102 10/24/2012 1001   CL 98 09/28/2011 0833   CO2 27 10/16/2014 0926   CO2 27 12/26/2013 0946   CO2 30 09/28/2011 0833   BUN 9.9 10/16/2014 0926   BUN 9 12/26/2013 0946   BUN 9 09/28/2011 0833   CREATININE 0.9 10/16/2014 0926   CREATININE 0.83 12/26/2013 0946   CREATININE 0.7 09/28/2011 0833      Component Value Date/Time   CALCIUM 8.6 10/16/2014 0926   CALCIUM 8.8 12/26/2013 0946   CALCIUM 8.7 09/28/2011 0833   ALKPHOS 117 10/16/2014 0926   ALKPHOS 148* 12/26/2013 0946   ALKPHOS 99* 09/28/2011 0833   AST 20 10/16/2014 0926   AST 12 12/26/2013 0946   AST 15 09/28/2011 0833   ALT 13 10/16/2014 0926   ALT <8 12/26/2013 0946   ALT 16 09/28/2011 0833   BILITOT 0.38 10/16/2014 0926   BILITOT 0.6 12/26/2013 0946   BILITOT 0.80 09/28/2011 0833       RADIOGRAPHIC STUDIES: I have personally reviewed the radiological images as listed and agreed with the findings in the report. Ct Chest W Contrast  10/16/2014   CLINICAL DATA:  Left upper lobe lung cancer, restaging. Chemotherapy in progress.  EXAM: CT CHEST, ABDOMEN, AND PELVIS WITH CONTRAST  TECHNIQUE: Multidetector CT imaging of the chest, abdomen and pelvis was performed following the standard protocol during bolus administration of intravenous contrast.  CONTRAST:  161m OMNIPAQUE IOHEXOL  300 MG/ML  SOLN  COMPARISON:  Multiple exams, including 07/16/2014  FINDINGS: CT CHEST FINDINGS  Mediastinum/Nodes: Stable appearance of soft tissue density in the mediastinum particularly a along the right lower paratracheal, subcarinal, and right hilar regions, likely representing response to therapy. No worrisome progression. Left thyroid lobe cyst. Right IJ Port-A-Cath tip: Right atrium. Coronary atherosclerosis.  Lungs/Pleura: Stable right paramediastinal fibrosis/radiation pneumonitis. Slightly reduced nodularity in the right lung. Stable lingular scarring.  Pleural thickening anteriorly at the right lung base with an adjacent epicardial lymph node with short axis diameter 0.9 cm, formerly 0.8 cm by my measurements. Currently measured on image 37 series 2.  Musculoskeletal: Unremarkable  CT ABDOMEN PELVIS FINDINGS  Hepatobiliary: Several tiny hepatic hypodensities in segment 4A, segment 7, and segment 6 appear stable. Mild diffuse hepatic steatosis.  Pancreas: Unremarkable  Spleen: Unremarkable  Adrenals/Urinary Tract: 4 mm hypodense lesion of the left kidney upper pole, technically nonspecific but statistically likely to be a small cyst, stable.  Stomach/Bowel: Unremarkable  Vascular/Lymphatic: Aortoiliac atherosclerotic vascular disease. Infrarenal abdominal aortic ectasia up to 2.8 cm on image 68 series 2.  Reproductive: Uterus absent.  Other: No supplemental non-categorized findings.  Musculoskeletal: Borderline left foraminal stenosis at L5-S1 due to disc bulge, intervertebral spurring, and facet spurring.  IMPRESSION: 1. Further reduction in size of the tiny right pulmonary nodules. 2. Minimal enlargement of the right epicardial lymph node from 8 mm to 9 mm in short axis. This merits observation. 3. Post therapy findings in the mediastinum and right upper lobe and perihilar region. 4. Coronary atherosclerosis. Infrarenal abdominal aortic ectasia, 2.8 cm diameter. 5. Borderline left foraminal stenosis at  L5-S1. 6. Stable hepatic hypodensities, favoring cysts.   Electronically Signed  By: Van Clines M.D.   On: 10/16/2014 12:04   Ct Abdomen Pelvis W Contrast  10/16/2014   CLINICAL DATA:  Left upper lobe lung cancer, restaging. Chemotherapy in progress.  EXAM: CT CHEST, ABDOMEN, AND PELVIS WITH CONTRAST  TECHNIQUE: Multidetector CT imaging of the chest, abdomen and pelvis was performed following the standard protocol during bolus administration of intravenous contrast.  CONTRAST:  176m OMNIPAQUE IOHEXOL 300 MG/ML  SOLN  COMPARISON:  Multiple exams, including 07/16/2014  FINDINGS: CT CHEST FINDINGS  Mediastinum/Nodes: Stable appearance of soft tissue density in the mediastinum particularly a along the right lower paratracheal, subcarinal, and right hilar regions, likely representing response to therapy. No worrisome progression. Left thyroid lobe cyst. Right IJ Port-A-Cath tip: Right atrium. Coronary atherosclerosis.  Lungs/Pleura: Stable right paramediastinal fibrosis/radiation pneumonitis. Slightly reduced nodularity in the right lung. Stable lingular scarring.  Pleural thickening anteriorly at the right lung base with an adjacent epicardial lymph node with short axis diameter 0.9 cm, formerly 0.8 cm by my measurements. Currently measured on image 37 series 2.  Musculoskeletal: Unremarkable  CT ABDOMEN PELVIS FINDINGS  Hepatobiliary: Several tiny hepatic hypodensities in segment 4A, segment 7, and segment 6 appear stable. Mild diffuse hepatic steatosis.  Pancreas: Unremarkable  Spleen: Unremarkable  Adrenals/Urinary Tract: 4 mm hypodense lesion of the left kidney upper pole, technically nonspecific but statistically likely to be a small cyst, stable.  Stomach/Bowel: Unremarkable  Vascular/Lymphatic: Aortoiliac atherosclerotic vascular disease. Infrarenal abdominal aortic ectasia up to 2.8 cm on image 68 series 2.  Reproductive: Uterus absent.  Other: No supplemental non-categorized findings.   Musculoskeletal: Borderline left foraminal stenosis at L5-S1 due to disc bulge, intervertebral spurring, and facet spurring.  IMPRESSION: 1. Further reduction in size of the tiny right pulmonary nodules. 2. Minimal enlargement of the right epicardial lymph node from 8 mm to 9 mm in short axis. This merits observation. 3. Post therapy findings in the mediastinum and right upper lobe and perihilar region. 4. Coronary atherosclerosis. Infrarenal abdominal aortic ectasia, 2.8 cm diameter. 5. Borderline left foraminal stenosis at L5-S1. 6. Stable hepatic hypodensities, favoring cysts.   Electronically Signed   By: WVan ClinesM.D.   On: 10/16/2014 12:04     ASSESSMENT & PLAN:  Cancer of upper lobe of left lung  She tolerated treatment well. Recent Imaging study yesterday show regression in size of tumors. We will continue on treatment every 3 weeks indefinitely. Her next imaging study would be due probably in September 2016.     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 Iron study in May 2016 is adequate She is not symptomatic. She will continue vitamin B-12 injection every few months and folic acid supplementation.     Brain metastases treated with radiosurgery Recent MRI is negative for disease recurrence. She is not symptomatic.     TOBACCO ABUSE I spent some time counseling the patient the importance of tobacco cessation. She is currently not interested to quit now.    Bilateral leg edema She has bilateral lower extremity edema, left greater than the right. This is likely related to low albumin with poor circulation. She will continue intermittently doses of Lasix as tolerated.     No orders of the defined types were placed in this encounter.   All questions were answered. The patient knows to call the clinic with any problems, questions or concerns. No barriers to learning was  detected. I spent 30 minutes  counseling the patient face to face. The total time spent in the appointment was 40 minutes and more than 50% was on counseling and review of test results     [facility], [place], MD 10/17/2014 11:38 AM

## 2014-10-17 NOTE — Telephone Encounter (Signed)
per pof to sch pt appt-sent MW email to sch pt trmt-gave pt copy of sch

## 2014-10-17 NOTE — Assessment & Plan Note (Signed)
She tolerated treatment well. Recent Imaging study yesterday show regression in size of tumors. We will continue on treatment every 3 weeks indefinitely. Her next imaging study would be due probably in September 2016.

## 2014-10-17 NOTE — Telephone Encounter (Signed)
Per staff message and POF I have scheduled appts. Advised scheduler of appts. JMW  

## 2014-10-17 NOTE — Assessment & Plan Note (Signed)
Recent MRI is negative for disease recurrence. She is not symptomatic. 

## 2014-10-23 ENCOUNTER — Other Ambulatory Visit: Payer: Self-pay

## 2014-10-23 NOTE — Patient Outreach (Signed)
[facility]) Care Management  10/23/2014  Lauren [name] [birth year] [id number]  Telephonic Care Coordination Note  Referral Date:  10/10/14 Referral Source:  [employer]  Referral Issue:  Cancer; having issues with CA that started in colon with mets to brain and lungs. Patient with reoccurrence twice in lungs and has been instructed she will need to have chemo every 3 weeks for the rest of her life or until her body is unable to handle it.  Patient was with a [name] in the past that offered her a great deal of help and support.  Insurance[facility]arthur Rossetti [facility] [id number] Admissions: 0 ED: 1 PCP:  Dr. Dalbert Mayotte  Outreach call #1 for Triage attempt.  Patient was not reached.  RN CM left HIPAA compliant message on answering machine.  RN CM scheduled for next attempted call within the next 3-5 business days.   [name], RN, BSN, [facility], CCM  [employer] Management Coordinator [phone] Office [phone] Direct [phone] Cell

## 2014-10-26 ENCOUNTER — Other Ambulatory Visit: Payer: Self-pay

## 2014-10-26 NOTE — Patient Outreach (Signed)
[name]) Care Management  10/26/2014  Lauren [name] Feb 25, 1951 [id number]   Telephonic Care Coordination Note  Referral Date: 10/10/14 Referral Source: [employer]  Referral Issue: Cancer; having issues with CA that started in colon with mets to brain and lungs. Patient with reoccurrence twice in lungs and has been instructed she will need to have chemo every 3 weeks for the rest of her life or until her body is unable to handle it. Patient was with a [name] in the past that offered her a great deal of help and support.  [facility] [id number] Admissions: 0 ED: 1 PCP: Dr. Dalbert Mayotte H/o Active with [facility]  Outreach call #2 for Triage attempt. Patient was not reached.  RN CM left HIPAA compliant message on answering machine.  RN CM scheduled for next attempted call within the next 3-5 business days.   [name], RN, BSN, [facility], CCM  [employer] Management Coordinator [phone] Office [phone] Direct [phone] Cell

## 2014-10-30 ENCOUNTER — Other Ambulatory Visit: Payer: Self-pay

## 2014-10-30 DIAGNOSIS — I10 Essential (primary) hypertension: Secondary | ICD-10-CM

## 2014-10-30 DIAGNOSIS — J449 Chronic obstructive pulmonary disease, unspecified: Secondary | ICD-10-CM

## 2014-10-30 DIAGNOSIS — C3412 Malignant neoplasm of upper lobe, left bronchus or lung: Secondary | ICD-10-CM

## 2014-10-30 NOTE — Addendum Note (Signed)
Addended by: Standley Brooking on: 10/30/2014 05:11 PM   Modules accepted: Orders

## 2014-10-30 NOTE — Patient Outreach (Signed)
[facility]) Care Management  10/30/2014  LYNEA ROLLISON [birth year] [id number]   Patient Introductory Letter sent.   [name], RN, BSN, [facility], CCM  [employer] Management Coordinator [phone] Office [phone] Direct [phone] Cell

## 2014-10-30 NOTE — Patient Outreach (Signed)
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 Patient with reoccurrence twice in lungs and has been instructed she will need to have chemo every 3 weeks for the rest of her life or until her body is unable to handle it. Patient was with a [name] in the past that offered her a great deal of help and support.  InsuranceJosephine [name] [facility] [id number] Admissions: 0 ED: 1 Triage and Initial Assessment completed 10/30/2014. [place] Referral:  10/30/2014  PCP: Dr. Dalbert Mayotte - next appt 11/08/14 @ [place]:  Oncologist:  Dr. Heath Lark (lung)  Outreach call #3 for Triage call.  Initial Assessment also completed this call.    Social: Widowed for more than 10 years and living in [place].  Patient has 3 adult children:  1 daughter Lauren [name]) and two sons; (one living in [place] [facility] with Huntington's Disease and another son in [place], [place] with possible Huntington's Disease. Falls:  0.   Patient is independent with ADLs.  Walks with a cane as needed only. Transportation: owns a car and still driving.  Caregiver and Emergency contact:  Daughter, Willaim [name], [phone] Advance Directive Yes, [place] and [place].  States she has a friend who has prepared her a requested CD with old school gospel music to be used at her funeral.  Patient states she wishes to be cremated DME:  [name], Talking scale provided by patients brother who worked for [employer] for [employer].  Safety:  Patient reports safety concern with bathing / showering.  States she could use a shower chair or bench; she is not sure which one.  States when she takes a bath; she has to roll over and get on her  knees and pull herself up with the two bars in her shower.  Patient is able to shower but has to stand. States Insurance has not covered in the past.   RN CM will review Humana coverage with Humana to determine if resource for this need and refer to SW if indicated.  Financial Concerns:   Patient states her Humana Co-pays are running high.  Specialist co-pays: 45.00 per visit and having to have several MRI and CT scans due to her cancer diagnosis.   States she owes [employer]  money and she currently has two bills showing 3,000 and 4,000 dollars.  Patient has not dicussed her concerns with [name] or a Scientist, product/process development.  Patient confirms she did not qualify for the indigent program due to being insured.   Patient states she does have an [place] but covers no medications.  Patient states she makes a $25.00 a month payment.    The [employer] provided patient with a grant to help cover Chemo cost.    Currently has no Medicaid but states past h/o having MCD which only covered the cost of her Medicare Part B.  Patient was denied Medicaid on her last application which was prior to patients health status changing and acquiring a lot of medical cost.  Income:  SSI $1,306 per month:  Just recently changed from SSD to [place].   Public Housing - states her rent is going up in August due to receiving  more SSI than SSD per month.  Food Stamps $16.00 about 2 years ago; but none since that time.    Food Pantry:  No resource but interested.  Life Line:  H/o past use but was costing  $29.00 per month and could no longer afford. Low Income Subsidy application:  Has never applied and does not know what this is.  Patient states she appreciates RN CM and stated "It really makes a difference when someone cares. God sent you and the [place] to me and I appreciate you just caring about me and trying to help me."  RN CM provided education on LIS resource.  Patient interested and  agreed.  RN CM sent referral to [place] for LIS Application assistance.  RN CM sent referral to [place] for the following: 1) Financial Assessment 2) Instruction on MCD application 3) Assistance with resources for food pantry.  4) Instruction on Food Stamp application  5) Please take patient a copy of [facility]. 6) Please assess any resources to cover Life Line cost.   7) Please assess for any other resources that may assist patient with financial resources due to high healthcare cost.   8)  Please assess for resource for shower chair or shower bench.   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 H/o reoccurrence twice in lungs and has been instructed she will need to have chemo every 3 weeks for the rest of her life or until her body is unable to handle it. Patient reported she was previously receiving monthly calls from [facility]  that offered her a great deal of help and support.  Chemo:  every 3 weeks  (lung)  Radiation - none since brain cancer.  No appetite; eating about 2 meals a day.  Tolerating regular diet but in smaller portions.  Denies any nausea but has medication prescribed in the event needed.   HBP Patient unable to state BP reading.  Patient does not self check.  (Per Epic:  Last MD office BP reading:  144/76 on 10/17/14). Weight:  164.4   Ht 5'4" States h/o having a heart blockage but unable to give specific details.    Cardiologist:  [facility] - Last appt 2016  next appt - none scheduled.  Patient states she thinks she goes to see the Cardiologist too much and thinks once a year is enough.  Patient expressed cost of co-pays interring with more regular appt's.  RN CM instructed to take medications as ordered and discussed importance of maintaining regular primary MD appt's.    COPD (2008) Managed by Primary MD.  States no unmanaged symptoms at  this time and using medications as ordered.  Patient states she did see a Pulmonologist with the [facility] a long time ago but due to cost of Specialist co-pays; prefers to let her Primary MD manage.   Other/aches/pains Reports Back issues starting around two years ago managed by Dr. Consuello Masse surgeon on [address]   States MD made a referral to see Dr. Delice Lesch but refused because patient's pain had improved.   States she has had numbness in left side of back since her back surgery two years ago.   C/O cold temperatures causing her hands, back and left ankle to lock up.  Patient does not report any knowledge of arthritis this call.   Patient states she has medications she uses for symptom management.  Medications:  Medication reconciliation completed 10/30/2014.  Allergies:  No medications identified but states allergy to all medical tapes.   Preventives: Flu Vaccine: 12/17/14 Pneumonia Vaccine: 2015 Shingles Vaccine:  yes Mammogram 08/28/2014 Pap:  Last pap (2015) and was advised no future pap's required.  H/o hysterectomy years ago but patient does not know if ovaries were removed.  Bone density: unknown - patient does not recall ever getting a bone density.  (Per Epic indicates completed but no date documented).  Colonoscopy: 2015  Urine specimen:  Patient states she has never had a urine specimen checked and is not sure why no one has checked her urine.  States "everyone thinks I am going to pass with cancer but I am concerned about my other health."  (Per Epic Last U/A 12/21/11) RN CM instructed patient it is ok to ask MD to complete a urinalysis and RN CM will send request to MD to make him aware that patient would like to have a U/A completed on wellness appt.   Consent Patient gave verbal consent for [facility] Telephonic and [employer] as needed.  RN CM instructed in [facility] introductory package which will include consent for services; please review, complete and sign. Advised to please  return to [facility] in enclosed pre-stamped envelope.  RN CM advised to please notify MD of any changes in her condition prior to scheduled appt's.   RN CM provided contact name and #, 24-hour nurse line # [phone].   RN CM confirmed patient is aware of 911 services for urgent emergency needs.  Plan: Telephonic Care Coordination Services: Acuity Level 3 RN CM notified [place] Assistant to update case status to Active RN CM sent Primary MD copy of Initial Assessment RN CM sent Primary MD Physician Involvement Letter RN CM sent SW Referral RN CM will notify primary MD for request for U/A on 2016 Wellness visit.   [name], RN, BSN, [facility], CCM  [employer] Management Coordinator [phone] Office [phone] Direct [phone] Cell

## 2014-10-30 NOTE — Patient Outreach (Signed)
[name]) Care Management  10/30/2014  Lauren [name] Dec 03, 1950 [id number]   Telephonic Care Coordination Service Note  Dr.Breen   Please see note below. Phone assessment on 10/30/2014.  Can you please make a note to discuss on patient's next appt.  Thanks,   PCP: Dr. Dalbert Mayotte - next appt 11/08/14 @ [place]    Urine specimen: Patient states she has never had a urine specimen checked and is not sure why no one has checked her urine. States "everyone thinks I am going to pass with cancer but I am concerned about my other health." (Per Epic Last U/A 12/21/11)  RN CM instructed patient it is ok to ask MD to complete a urinalysis and RN CM will send request to MD to make him aware that patient would like to have a U/A completed on wellness appt.   [name], RN, BSN, [facility], CCM  [employer] Management Coordinator [phone] Office [phone] Direct [phone] Cell

## 2014-10-30 NOTE — Patient Outreach (Signed)
[place] [facility]) Care Management  10/30/2014  Lauren [name] [birth year] [id number]   Request from Lauren Laster, RN to referral to SW, assigned [employer], [place].  Lauren [name]. [place], [place] Management [place] Assistant Phone: [phone] Fax: [phone]

## 2014-10-31 ENCOUNTER — Telehealth: Payer: Self-pay | Admitting: *Deleted

## 2014-10-31 ENCOUNTER — Telehealth: Payer: Self-pay

## 2014-10-31 NOTE — Patient Outreach (Signed)
[facility]) Care Management  10/31/2014  Lauren [name] 14-Jul-1950 [id number]   Physician Involvement letter sent to Dr. Lindell Noe. (see letters tab)  [name], RN, BSN, [facility], [place] Management Care Management Coordinator [phone] Office [phone] Direct [phone] Cell

## 2014-10-31 NOTE — Telephone Encounter (Signed)
Left voice message for [name], RN with [facility] regarding pt's PCP.  Dr. Luiz Blare will be patient's PCP.  Dr. Gerarda Fraction will have to have a preceptor in clinic to co-sign due to her being a resident.  Pt's next appt 11/08/14 with Dr. McDiarmid in [facility].  Derl Barrow, RN

## 2014-10-31 NOTE — Patient Outreach (Signed)
[facility]) Care Management  10/31/2014  BROOKLYNE RADKE 1951-02-12 [id number]   Inbound call from [place] will not cover any portion of shower chair or bench for patient.   Recommends going to [employer] where item is less costly than DME provider.  RN CM sent [facility] SW Referral for resource for DME needs.   [name], RN, BSN, [facility], CCM  [employer] Management Coordinator [phone] Office [phone] Direct [phone] Cell

## 2014-10-31 NOTE — Patient Outreach (Signed)
[facility]) Care Management  10/31/2014  SACRED ROA July 31, 1950 [id number]  Telephonic Care Coordination Note   Issue:  DME and Safety Concerns:  Patient reported on 10/31/14 safety concern with bathing and showering. States she could use a shower chair or bench; she is not sure which one she needs to fit her shower/tub. States when she takes a bath; she has to roll over and get on her knees and pull herself up with the two bars in her shower. Patient is able to shower but has to stand creating another safety concern during this time of chronic oncology needs.  States Insurance has not covered in the past and is not sure if [place] covers.  RN CM will review Humana coverage with Humana to determine if resource for this need and refer to SW if indicated.  RN CM reviewed Humana Benefits for DME:  20% of the cost with preferred vendor.  RN CM will consult with vendor regarding which piece of DME would be most appropriate for patients needs: bench v chair.   Outreach call placed to [employer] [facility] provider).  RN CM left voice mail requesting call back to discuss cost of item.  [facility] [address] [place], [place] [address] [phone] phone [phone] fax Contact: Jeannene Patella or [place]ane Call Provider   [name], RN, BSN, [facility], [place] Management Care Management Coordinator [phone] Office [phone] Direct [phone] Cell

## 2014-11-02 ENCOUNTER — Telehealth: Payer: Self-pay

## 2014-11-02 NOTE — Patient Outreach (Signed)
[facility]) Care Management  11/02/2014  JAYEL SCADUTO Dec 19, 1950 [id number]   Telephonic Care Coordination Note  Primary MD update:  PCP: Dr. Dalbert Mayotte has been changed to Dr. Luiz Blare, Resident MD (requires MD preceptor for that day in clinic to co-sign orders).    next [facility]14 2[facility]pm [facility] with Dr. Dr. McDiarmid.   [name], RN, BSN, [facility], CCM  [employer] Management Coordinator [phone] Office [phone] Direct [phone] Cell

## 2014-11-05 NOTE — Patient Outreach (Signed)
[facility] [facility]) Care Management  11/05/2014  SIDDA HUMM [birth year] [id number]   Notification received from Lurline Del, [place] Assistant, to outreach patient for extra help application.  I called Mrs. Shaver today to enroll her into Extra Help.  She was willing to do it over the phone.  We completed the application online and submitted it today.  She should hear from social security administration in the next 2 to 4 weeks.  She is going to call Wellington Hampshire, [place] Management Assistant back when he/she hears from them.  Jasslyn Finkel L. Elbert Ewings [facility] Care Management Assistant [phone]

## 2014-11-05 NOTE — Patient Outreach (Signed)
[facility]) Care Management  11/05/2014  Lauren [name] Jul 14, 1950 [id number]   Request from [name], RN for assistance with LIS (Extra Help) application, request sent to [facility], Care Manager Assitant  [name]. [place], [place] Management [place] Assistant Phone: [phone] Fax: [phone]

## 2014-11-07 ENCOUNTER — Other Ambulatory Visit (HOSPITAL_BASED_OUTPATIENT_CLINIC_OR_DEPARTMENT_OTHER): Payer: Commercial Managed Care - HMO

## 2014-11-07 ENCOUNTER — Ambulatory Visit (HOSPITAL_BASED_OUTPATIENT_CLINIC_OR_DEPARTMENT_OTHER): Payer: Commercial Managed Care - HMO

## 2014-11-07 VITALS — BP 159/69 | HR 70 | Temp 97.6°F | Resp 18

## 2014-11-07 DIAGNOSIS — C7931 Secondary malignant neoplasm of brain: Secondary | ICD-10-CM | POA: Diagnosis not present

## 2014-11-07 DIAGNOSIS — Z5111 Encounter for antineoplastic chemotherapy: Secondary | ICD-10-CM

## 2014-11-07 DIAGNOSIS — C3412 Malignant neoplasm of upper lobe, left bronchus or lung: Secondary | ICD-10-CM | POA: Diagnosis not present

## 2014-11-07 DIAGNOSIS — Z95828 Presence of other vascular implants and grafts: Secondary | ICD-10-CM

## 2014-11-07 LAB — CBC WITH DIFFERENTIAL/PLATELET
BASO%: 0.9 % (ref 0.0–2.0)
Basophils Absolute: 0 10*3/uL (ref 0.0–0.1)
EOS%: 1.7 % (ref 0.0–7.0)
Eosinophils Absolute: 0.1 10*3/uL (ref 0.0–0.5)
HCT: 35 % (ref 34.8–46.6)
HEMOGLOBIN: 11.1 g/dL — AB (ref 11.6–15.9)
LYMPH#: 0.8 10*3/uL — AB (ref 0.9–3.3)
LYMPH%: 19.7 % (ref 14.0–49.7)
MCH: 26.6 pg (ref 25.1–34.0)
MCHC: 31.7 g/dL (ref 31.5–36.0)
MCV: 83.8 fL (ref 79.5–101.0)
MONO#: 0.5 10*3/uL (ref 0.1–0.9)
MONO%: 13.2 % (ref 0.0–14.0)
NEUT#: 2.7 10*3/uL (ref 1.5–6.5)
NEUT%: 64.5 % (ref 38.4–76.8)
Platelets: 281 10*3/uL (ref 145–400)
RBC: 4.18 10*6/uL (ref 3.70–5.45)
RDW: 21.2 % — AB (ref 11.2–14.5)
WBC: 4.1 10*3/uL (ref 3.9–10.3)

## 2014-11-07 LAB — COMPREHENSIVE METABOLIC PANEL (CC13)
ALK PHOS: 118 U/L (ref 40–150)
ALT: 11 U/L (ref 0–55)
ANION GAP: 7 meq/L (ref 3–11)
AST: 15 U/L (ref 5–34)
Albumin: 2.9 g/dL — ABNORMAL LOW (ref 3.5–5.0)
BUN: 8.2 mg/dL (ref 7.0–26.0)
CO2: 29 meq/L (ref 22–29)
Calcium: 8.9 mg/dL (ref 8.4–10.4)
Chloride: 108 mEq/L (ref 98–109)
Creatinine: 0.9 mg/dL (ref 0.6–1.1)
EGFR: 77 mL/min/{1.73_m2} — ABNORMAL LOW (ref >90)
Glucose: 88 mg/dl (ref 70–140)
Potassium: 3.9 mEq/L (ref 3.5–5.1)
SODIUM: 144 meq/L (ref 136–145)
TOTAL PROTEIN: 6.9 g/dL (ref 6.4–8.3)
Total Bilirubin: 0.4 mg/dL (ref 0.20–1.20)

## 2014-11-07 MED ORDER — SODIUM CHLORIDE 0.9 % IV SOLN
486.0000 mg/m2 | Freq: Once | INTRAVENOUS | Status: AC
  Administered 2014-11-07: 900 mg via INTRAVENOUS
  Filled 2014-11-07: qty 36

## 2014-11-07 MED ORDER — HEPARIN SOD (PORK) LOCK FLUSH 100 UNIT/ML IV SOLN
500.0000 [IU] | Freq: Once | INTRAVENOUS | Status: DC
  Administered 2014-11-07: 500 [IU] via INTRAVENOUS
  Filled 2014-11-07: qty 5

## 2014-11-07 MED ORDER — SODIUM CHLORIDE 0.9 % IJ SOLN
10.0000 mL | INTRAMUSCULAR | Status: DC | PRN
  Administered 2014-11-07: 10 mL
  Filled 2014-11-07: qty 10

## 2014-11-07 MED ORDER — HEPARIN SOD (PORK) LOCK FLUSH 100 UNIT/ML IV SOLN
500.0000 [IU] | Freq: Once | INTRAVENOUS | Status: DC | PRN
  Filled 2014-11-07: qty 5

## 2014-11-07 MED ORDER — SODIUM CHLORIDE 0.9 % IV SOLN
Freq: Once | INTRAVENOUS | Status: AC
  Administered 2014-11-07: 12:00:00 via INTRAVENOUS
  Filled 2014-11-07: qty 4

## 2014-11-07 MED ORDER — SODIUM CHLORIDE 0.9 % IV SOLN
Freq: Once | INTRAVENOUS | Status: AC
  Administered 2014-11-07: 12:00:00 via INTRAVENOUS

## 2014-11-07 MED ORDER — SODIUM CHLORIDE 0.9 % IJ SOLN
10.0000 mL | INTRAMUSCULAR | Status: DC | PRN
  Administered 2014-11-07: 10 mL via INTRAVENOUS
  Filled 2014-11-07: qty 10

## 2014-11-07 NOTE — Patient Instructions (Signed)
[facility] Discharge Instructions for Patients Receiving Chemotherapy  Today you received the following chemotherapy agents:  Alimta  To help prevent nausea and vomiting after your treatment, we encourage you to take your nausea medication as ordered per MD.   If you develop nausea and vomiting that is not controlled by your nausea medication, call the clinic.   BELOW ARE SYMPTOMS THAT SHOULD BE REPORTED IMMEDIATELY:  *FEVER GREATER THAN 100.5 F  *CHILLS WITH OR WITHOUT FEVER  NAUSEA AND VOMITING THAT IS NOT CONTROLLED WITH YOUR NAUSEA MEDICATION  *UNUSUAL SHORTNESS OF BREATH  *UNUSUAL BRUISING OR BLEEDING  TENDERNESS IN MOUTH AND THROAT WITH OR WITHOUT PRESENCE OF ULCERS  *URINARY PROBLEMS  *BOWEL PROBLEMS  UNUSUAL RASH Items with * indicate a potential emergency and should be followed up as soon as possible.  Feel free to call the clinic you have any questions or concerns. The clinic phone number is [phone].  Please show the CHEMO ALERT CARD at check-in to the Emergency Department and triage nurse.   

## 2014-11-07 NOTE — Patient Instructions (Signed)

## 2014-11-08 ENCOUNTER — Other Ambulatory Visit: Payer: Self-pay | Admitting: Home Health Services

## 2014-11-08 ENCOUNTER — Ambulatory Visit (INDEPENDENT_AMBULATORY_CARE_PROVIDER_SITE_OTHER): Payer: Commercial Managed Care - HMO | Admitting: Family Medicine

## 2014-11-08 ENCOUNTER — Encounter: Payer: Self-pay | Admitting: Family Medicine

## 2014-11-08 ENCOUNTER — Other Ambulatory Visit: Payer: Self-pay | Admitting: *Deleted

## 2014-11-08 VITALS — BP 132/61 | HR 80 | Temp 98.5°F | Ht 64.0 in | Wt 167.7 lb

## 2014-11-08 DIAGNOSIS — Z23 Encounter for immunization: Secondary | ICD-10-CM

## 2014-11-08 DIAGNOSIS — J309 Allergic rhinitis, unspecified: Secondary | ICD-10-CM

## 2014-11-08 DIAGNOSIS — H01006 Unspecified blepharitis left eye, unspecified eyelid: Secondary | ICD-10-CM

## 2014-11-08 DIAGNOSIS — Z0000 Encounter for general adult medical examination without abnormal findings: Secondary | ICD-10-CM

## 2014-11-08 DIAGNOSIS — H01003 Unspecified blepharitis right eye, unspecified eyelid: Secondary | ICD-10-CM | POA: Diagnosis not present

## 2014-11-08 DIAGNOSIS — H1013 Acute atopic conjunctivitis, bilateral: Secondary | ICD-10-CM | POA: Diagnosis not present

## 2014-11-08 MED ORDER — NEBIVOLOL HCL 10 MG PO TABS: 10.0000 mg | ORAL_TABLET | Freq: Every day | ORAL | Status: DC

## 2014-11-08 MED ORDER — POTASSIUM CHLORIDE CRYS ER 20 MEQ PO TBCR: 20.0000 meq | EXTENDED_RELEASE_TABLET | Freq: Two times a day (BID) | ORAL | Status: DC

## 2014-11-08 MED ORDER — OMEPRAZOLE 40 MG PO CPDR: 40.0000 mg | DELAYED_RELEASE_CAPSULE | Freq: Every day | ORAL | Status: DC

## 2014-11-08 MED ORDER — OLOPATADINE HCL 0.2 % OP SOLN: 1.0000 [drp] | Freq: Every day | OPHTHALMIC | Status: DC

## 2014-11-08 MED ORDER — NITROGLYCERIN 0.4 MG SL SUBL: 0.4000 mg | SUBLINGUAL_TABLET | SUBLINGUAL | Status: DC | PRN

## 2014-11-08 MED ORDER — FUROSEMIDE 20 MG PO TABS: 20.0000 mg | ORAL_TABLET | Freq: Every day | ORAL | Status: DC

## 2014-11-08 MED ORDER — FLUOXETINE HCL 20 MG PO TABS: 20.0000 mg | ORAL_TABLET | Freq: Every day | ORAL | Status: DC

## 2014-11-08 MED ORDER — PRAVASTATIN SODIUM 40 MG PO TABS: ORAL_TABLET | ORAL | Status: DC

## 2014-11-08 NOTE — Patient Instructions (Addendum)
Use baby shampoo to wash eyes a few times a day. We will send prescription for eye allergy. Use as prescribed.   Blepharitis Blepharitis is redness, soreness, and swelling (inflammation) of one or both eyelids. It may be caused by an allergic reaction or a bacterial infection. Blepharitis may also be associated with reddened, scaly skin (seborrhea) of the scalp and eyebrows. While you sleep, eye discharge may cause your eyelashes to stick together. Your eyelids may itch, burn, swell, and may lose their lashes. These will grow back. Your eyes may become sensitive. Blepharitis may recur and need repeated treatment. If this is the case, you may require further evaluation by an eye specialist (ophthalmologist). HOME CARE INSTRUCTIONS   Keep your hands clean.  Use a clean towel each time you dry your eyelids. Do not use this towel to clean other areas. Do not share a towel or makeup with anyone.  Wash your eyelids with warm water or warm water mixed with a small amount of baby shampoo. Do this twice a day or as often as needed.  Wash your face and eyebrows at least once a day.  Use warm compresses 2 times a day for 10 minutes at a time, or as directed by your caregiver.  Apply antibiotic ointment as directed by your caregiver.  Avoid rubbing your eyes.  Avoid wearing makeup until you get better.  Follow up with your caregiver as directed. SEEK IMMEDIATE MEDICAL CARE IF:   You have pain, redness, or swelling that gets worse or spreads to other parts of your face.  Your vision changes, or you have pain when looking at lights or moving objects.  You have a fever.  Your symptoms continue for longer than 2 to 4 days or become worse. MAKE SURE YOU:   Understand these instructions.  Will watch your condition.  Will get help right away if you are not doing well or get worse. Document Released: 05/01/2000 Document Revised: 07/27/2011 Document Reviewed: 06/11/2010 [facility] Patient  Information 2015 [place], [place]. This information is not intended to replace advice given to you by your health care provider. Make sure you discuss any questions you have with your health care provider.  Allergic Conjunctivitis The conjunctiva is a thin membrane that covers the visible white part of the eyeball and the underside of the eyelids. This membrane protects and lubricates the eye. The membrane has small blood vessels running through it that can normally be seen. When the conjunctiva becomes inflamed, the condition is called conjunctivitis. In response to the inflammation, the conjunctival blood vessels become swollen. The swelling results in redness in the normally white part of the eye. The blood vessels of this membrane also react when a person has allergies and is then called allergic conjunctivitis. This condition usually lasts for as long as the allergy persists. Allergic conjunctivitis cannot be passed to another person (non-contagious). The likelihood of bacterial infection is great and the cause is not likely due to allergies if the inflamed eye has:  A sticky discharge.  Discharge or sticking together of the lids in the morning.  Scaling or flaking of the eyelids where the eyelashes come out.  Red swollen eyelids. CAUSES   Viruses.  Irritants such as foreign bodies.  Chemicals.  General allergic reactions.  Inflammation or serious diseases in the inside or the outside of the eye or the orbit (the boney cavity in which the eye sits) can cause a "red eye." SYMPTOMS   Eye redness.  Tearing.  Itchy  eyes.  Burning feeling in the eyes.  Clear drainage from the eye.  Allergic reaction due to pollens or ragweed sensitivity. Seasonal allergic conjunctivitis is frequent in the spring when pollens are in the air and in the fall. DIAGNOSIS  This condition, in its many forms, is usually diagnosed based on the history and an ophthalmological exam. It usually involves both  eyes. If your eyes react at the same time every year, allergies may be the cause. While most "red eyes" are due to allergy or an infection, the role of an eye (ophthalmological) exam is important. The exam can rule out serious diseases of the eye or orbit. TREATMENT   Non-antibiotic eye drops, ointments, or medications by mouth may be prescribed if the ophthalmologist is sure the conjunctivitis is due to allergies alone.  Over-the-counter drops and ointments for allergic symptoms should be used only after other causes of conjunctivitis have been ruled out, or as your caregiver suggests. Medications by mouth are often prescribed if other allergy-related symptoms are present. If the ophthalmologist is sure that the conjunctivitis is due to allergies alone, treatment is normally limited to drops or ointments to reduce itching and burning. HOME CARE INSTRUCTIONS   Wash hands before and after applying drops or ointments, or touching the inflamed eye(s) or eyelids.  Do not let the eye dropper tip or ointment tube touch the eyelid when putting medicine in your eye.  Stop using your soft contact lenses and throw them away. Use a new pair of lenses when recovery is complete. You should run through sterilizing cycles at least three times before use after complete recovery if the old soft contact lenses are to be used. Hard contact lenses should be stopped. They need to be thoroughly sterilized before use after recovery.  Itching and burning eyes due to allergies is often relieved by using a cool cloth applied to closed eye(s). SEEK MEDICAL CARE IF:   Your problems do not go away after two or three days of treatment.  Your lids are sticky (especially in the morning when you wake up) or stick together.  Discharge develops. Antibiotics may be needed either as drops, ointment, or by mouth.  You have extreme light sensitivity.  An oral temperature above 102 F (38.9 C) develops.  Pain in or around the  eye or any other visual symptom develops. MAKE SURE YOU:   Understand these instructions.  Will watch your condition.  Will get help right away if you are not doing well or get worse. Document Released: 07/25/2002 Document Revised: 07/27/2011 Document Reviewed: 06/20/2007 [facility] Patient Information 2015 [place], [place]. This information is not intended to replace advice given to you by your health care provider. Make sure you discuss any questions you have with your health care provider.

## 2014-11-08 NOTE — Progress Notes (Signed)
Subjective:   Lauren [name] is a 64 y.o. female who presents for an Initial Medicare Annual Wellness Visit.     Objective:    Today's Vitals   11/08/14 1548 11/08/14 1609  BP: 132/61   Pulse: 80   Temp: 98.5 F (36.9 C)   TempSrc: Oral   Height: '5\' 4"'$  (1.626 m)   Weight: 167 lb 11.2 oz (76.068 kg)   PainSc:  1     Current Medications (verified) Outpatient Encounter Prescriptions as of 11/08/2014  Medication Sig  . albuterol (PROVENTIL HFA;VENTOLIN HFA) 108 (90 BASE) MCG/ACT inhaler Inhale 1 puff into the lungs 2 (two) times daily.  Marland Kitchen aspirin 81 MG chewable tablet Chew 81 mg by mouth daily.  . diclofenac sodium (VOLTAREN) 1 % GEL Apply 2 g topically 4 (four) times daily. As needed for pain  . FLUoxetine (PROZAC) 20 MG tablet Take 1 tablet (20 mg total) by mouth daily.  . folic acid (FOLVITE) 1 MG tablet Take 1 tablet (1 mg total) by mouth daily.  . furosemide (LASIX) 20 MG tablet Take 1 tablet (20 mg total) by mouth daily.  Marland Kitchen lidocaine-prilocaine (EMLA) cream Apply topically as needed. Apply to porta cath site one hour prior to needle stick.  . mirtazapine (REMERON) 15 MG tablet Take 1 tablet (15 mg total) by mouth at bedtime.  . nebivolol (BYSTOLIC) 10 MG tablet Take 1 tablet (10 mg total) by mouth daily.  . nitroGLYCERIN (NITROSTAT) 0.4 MG SL tablet Place 1 tablet (0.4 mg total) under the tongue every 5 (five) minutes as needed. For chest pain  . oxyCODONE (OXY IR/ROXICODONE) 5 MG immediate release tablet Take 1 tablet (5 mg total) by mouth every 6 (six) hours as needed for severe pain.  . potassium chloride SA (K-DUR,KLOR-CON) 20 MEQ tablet Take 1 tablet (20 mEq total) by mouth 2 (two) times daily. (Patient taking differently: Take 20 mEq by mouth daily. )  . pravastatin (PRAVACHOL) 40 MG tablet TAKE 1 TABLET BY MOUTH ONCE DAILY  . tiZANidine (ZANAFLEX) 2 MG tablet Take 1 tablet (2 mg total) by mouth every 6 (six) hours as needed (Back Muscle Spasm).  . traMADol  (ULTRAM) 50 MG tablet Take 1 tablet (50 mg total) by mouth every 6 (six) hours as needed.  . Olopatadine HCl 0.2 % SOLN Apply 1 drop to eye daily. (Patient not taking: Reported on 11/08/2014)  . omeprazole (PRILOSEC) 40 MG capsule Take 1 capsule (40 mg total) by mouth daily.  . ondansetron (ZOFRAN) 8 MG tablet   . prochlorperazine (COMPAZINE) 10 MG tablet    No facility-administered encounter medications on file as of 11/08/2014.    Allergies (verified) Adhesive and Lisinopril   History: Past Medical History  Diagnosis Date  . Depression   . Hyperlipidemia   . Hypertension   . Lung nodule     FNA ordered for 04/02/10 by HA>pos Ca  . Chronic folliculitis     of groin  . Fibrocystic breast changes   . Family history of trichomonal vaginitis 05/2005  . Postmenopausal   . Depressive disorder   . Hypercholesterolemia   . GERD (gastroesophageal reflux disease)   . Cerebral aneurysm   . Back pain   . Shortness of breath   . COPD (chronic obstructive pulmonary disease)   . Coronary artery disease   . Arthritis   . Weight loss 10/22/2011  . Status post radiation therapy 11/16/11 - 12/29/11    Right Lung and Mediastinum: 60 Gy  .  Status post chemotherapy comp. 12/29/11    Carboplatin/Taxol  . S/P radiation therapy 03/01/12    SRS: 1 fraction / 20 Gray each to the Left Occipital Region and to the Right Insular Metastases  . On antineoplastic chemotherapy started 02/2012    [place]  . Fatigue 02/06/2013  . Pleural effusion 05/03/2013  . Blurred vision, bilateral 06/06/2014  . Colon cancer 11/08  . Lung cancer 06/16/10    PET scan 04/28/2010; primary: increase in size 02/2010 / Well Differentiated Adenocarcinoma of the lung   . Brain metastases 02/15/12   Past Surgical History  Procedure Laterality Date  . Colectomy  03/22/07    Stage 1 pT2 N0, M0 Adenocarcinoma of the sigmoid  colon  . Tubal ligation    . Lung lobectomy  06/16/10    Left Upper Lobectomy  . Hernia repair    . Breast  surgery      Bil lumpectomy  . Back surgery    . Mediastinoscopy  10/19/2011    Procedure: MEDIASTINOSCOPY;  Surgeon: Gaye Pollack, MD;  Location: [facility] OR;  Service: Thoracic;  Laterality: N/A;  . Cardiac cath x3    . Chest tube insertion Right 06/12/2013    Procedure: INSERTION PLEURAL DRAINAGE CATHETER;  Surgeon: Gaye Pollack, MD;  Location: [facility];  Service: Thoracic;  Laterality: Right;  . Talc pleurodesis Right 06/12/2013    Procedure: Pietro Cassis;  Surgeon: Gaye Pollack, MD;  Location[facility] OR;  Service: Thoracic;  Laterality: Right;   Family History  Problem Relation Age of Onset  . Stomach cancer Maternal Aunt   . Breast cancer Cousin   . Cancer Sister     Lymphatic  . Osteoarthritis Father   . Gout Father   . Hypertension Father   . Heart disease Mother     pericarditis;   . Anesthesia problems Neg Hx    Social History   Occupational History  . retired-graphic Engineer, civil (consulting)    Social History Main Topics  . Smoking status: Current Every Day Smoker -- 1.00 packs/day    Types: Cigarettes  . Smokeless tobacco: Never Used  . Alcohol Use: 0.0 oz/week    0 Standard drinks or equivalent per week     Comment: occasional  . Drug Use: No  . Sexual Activity: Not on file    Tobacco Counseling Ready to quit: No Counseling given: Yes   Activities of Daily Living In your present state of health, do you have any difficulty performing the following activities: 11/08/2014 10/30/2014  Hearing? N N  Vision? N N  Difficulty concentrating or making decisions? N N  Walking or climbing stairs? N N  Dressing or bathing? N N  Doing errands, shopping? N N  Preparing Food and eating ? - N  Using the Toilet? - N  In the past six months, have you accidently leaked urine? - N  Do you have problems with loss of bowel control? - N  Managing your Medications? - N  Managing your Finances? - N  Housekeeping or managing your Housekeeping? - Y    Immunizations and Health  Maintenance Immunization History  Administered Date(s) Administered  . Influenza Split 04/22/2011, 02/19/2012  . Influenza Whole 02/13/2008, 04/23/2009, 02/27/2010  . Influenza,inj,Quad PF,36+ Mos 02/06/2013, 02/16/2014  . Pneumococcal Polysaccharide-23 05/05/2010  . Td 01/16/2005   Health Maintenance Due  Topic Date Due  . PNA vac Low Risk Adult (1 of 2 - PCV13) 10/09/2013  . COLONOSCOPY  03/17/2014  Patient Care Team: Katheren Shams, DO as PCP - General (Family Medicine) Gaye Pollack, MD (Cardiothoracic Surgery) Thea Silversmith, MD (Radiation Oncology) Dickie La, MD (Family Medicine) Heath Lark, MD as Consulting Physician (Hematology and Oncology) Standley Brooking, RN as [place] as [place] any recent Medical Services you may have received from other than [facility] providers in the past year (date may be approximate).     Assessment:   This is a routine wellness examination for Lauren [name].  Hearing/Vision screen  Hearing Screening   Method: Audiometry   '125Hz'$  '250Hz'$  '500Hz'$  '1000Hz'$  '2000Hz'$  '4000Hz'$  '8000Hz'$   Right ear:   40 40 40 40   Left ear:   40 40 40 40     Visual Acuity Screening   Right eye Left eye Both eyes  Without correction:     With correction: '20/25 20/25 20/20 '$    Dietary issues and exercise activities discussed:    Goals    None     Depression Screen PHQ 2/9 Scores 11/08/2014 10/30/2014 12/26/2013  PHQ - 2 Score 0 0 0    Fall Risk Fall Risk  11/08/2014 10/30/2014 12/26/2013 11/20/2013  Falls in the past year? Yes No Yes Yes  Number falls in past yr: 1 - 1 1  Injury with Fall? No - No No  Risk for fall due to : - - Impaired balance/gait -    Cognitive Function: MMSE - Mini Mental State Exam 11/08/2014  Orientation to time 5  Orientation to Place 5  Registration 3  Attention/ Calculation 5  Recall 3  Language- name 2 objects 2  Language- repeat 1  Language-  follow 3 step command 3  Language- read & follow direction 1  Write a sentence 1  Copy design 1  Total score 30    Screening Tests Health Maintenance  Topic Date Due  . PNA vac Low Risk Adult (1 of 2 - PCV13) 10/09/2013  . COLONOSCOPY  03/17/2014  . INFLUENZA VACCINE  12/17/2014  . TETANUS/TDAP  01/17/2015  . MAMMOGRAM  08/27/2016  . DEXA SCAN  Completed  . ZOSTAVAX  Addressed      Plan:    During the course of the visit, Lauren [name] was educated and counseled about the following appropriate screening and preventive services:   Vaccines to include Pneumoccal, Influenza, Hepatitis B, Td, Zostavax, HCV  Electrocardiogram  Cardiovascular disease screening  Colorectal cancer screening  Bone density screening  Diabetes screening  Glaucoma screening  Mammography/PAP  Nutrition counseling  Smoking cessation counseling  Patient Instructions (the written plan) were given to the patient.    Lauren [name] Lauren [name] Lauren [name]   11/08/2014

## 2014-11-08 NOTE — Patient Outreach (Signed)
[place] [facility]) Care Management  11/08/2014  ABBI MANCINI 17-Jun-1950 [id number]   Phone call to patient to assess for social work needs.  HIPPA compliant voicemail message left.  This CSW will continue with attempts to reach patient.   [name] [facility] Care Management [phone]

## 2014-11-09 DIAGNOSIS — H01003 Unspecified blepharitis right eye, unspecified eyelid: Secondary | ICD-10-CM | POA: Insufficient documentation

## 2014-11-09 DIAGNOSIS — H01006 Unspecified blepharitis left eye, unspecified eyelid: Secondary | ICD-10-CM

## 2014-11-09 HISTORY — DX: Unspecified blepharitis right eye, unspecified eyelid: H01.006

## 2014-11-09 HISTORY — DX: Unspecified blepharitis right eye, unspecified eyelid: H01.003

## 2014-11-09 NOTE — Assessment & Plan Note (Signed)
New problem No further workup Suspect allergic component to thickening of eyelids Recommend Baby shampoo cleaning of eyelids twice a day Pataday ophth soln 1 qtt each eye daily for allergic component.

## 2014-11-09 NOTE — Progress Notes (Signed)
   Subjective:    Patient ID: Lauren [name], female    DOB: 07-16-1950, 64 y.o.   MRN: [id number]  HPI EYE COMPLAINT  Location: bilateral  Onset: last week Better with: nothing.  Was started on Cipro Opth solution last week by oncology without help.    Symptoms Discharge: yes, thin watery discharge from lateral canthi Pain: no Photophobia: no Decreased Vision: no URI symptoms: no Itching/Allergy sxs: no Glaucoma: no Recent eye surgery: no Contact lens use: no  Red Flags Trauma: no Foreign Body: no Vomiting/HA: no Halos around lights: no Chickenpox or zoster: no    Review of Systems No fever    Objective:   Physical Exam  Constitutional: She appears well-nourished. No distress.  Eyes: EOM are normal. Right conjunctiva is not injected. Left conjunctiva is not injected. Right pupil is reactive. Left pupil is not reactive.            Assessment & Plan:

## 2014-11-16 ENCOUNTER — Other Ambulatory Visit: Payer: Self-pay | Admitting: *Deleted

## 2014-11-16 NOTE — Patient Outreach (Signed)
[facility]) Care Management  11/16/2014  IZABELL SCHALK [birth year] [id number]   Coordination phone call to complete needs assessment related to financial difficulties.  Home visit scheduled for 11/21/14 at 10am  to assist with the following:        Instruction on MCD application          Assistance with resources for food pantry.          Instruction on Food Stamp application          Scientist, water quality.           resources to cover [employer] cost.          resource for shower chair                 [facility] Care Management [phone]

## 2014-11-21 ENCOUNTER — Other Ambulatory Visit: Payer: Self-pay | Admitting: *Deleted

## 2014-11-22 NOTE — Patient Outreach (Addendum)
[name]) Care Management  [facility] Social Work  11/22/2014  Lauren [name] 1951-03-20 [id number]  Subjective:    Objective:   Current Medications:  Current Outpatient Prescriptions  Medication Sig Dispense Refill  . olopatadine (PATANOL) 0.1 % ophthalmic solution Place 1 drop into both eyes 1 day or 1 dose.    . albuterol (PROVENTIL HFA;VENTOLIN HFA) 108 (90 BASE) MCG/ACT inhaler Inhale 1 puff into the lungs 2 (two) times daily. 6.7 g prn  . aspirin 81 MG chewable tablet Chew 81 mg by mouth daily.    . diclofenac sodium (VOLTAREN) 1 % GEL Apply 2 g topically 4 (four) times daily. As needed for pain 100 g 11  . FLUoxetine (PROZAC) 20 MG tablet Take 1 tablet (20 mg total) by mouth daily. 90 tablet 3  . folic acid (FOLVITE) 1 MG tablet Take 1 tablet (1 mg total) by mouth daily. 90 tablet 3  . furosemide (LASIX) 20 MG tablet Take 1 tablet (20 mg total) by mouth daily. 90 tablet 3  . lidocaine-prilocaine (EMLA) cream Apply topically as needed. Apply to porta cath site one hour prior to needle stick. 30 g 6  . mirtazapine (REMERON) 15 MG tablet Take 1 tablet (15 mg total) by mouth at bedtime. 90 tablet 0  . nebivolol (BYSTOLIC) 10 MG tablet Take 1 tablet (10 mg total) by mouth daily. 90 tablet 4  . nitroGLYCERIN (NITROSTAT) 0.4 MG SL tablet Place 1 tablet (0.4 mg total) under the tongue every 5 (five) minutes as needed. For chest pain 10 tablet 0  . Olopatadine HCl 0.2 % SOLN Apply 1 drop to eye daily. (Patient not taking: Reported on 11/08/2014) 5 mL 2  . omeprazole (PRILOSEC) 40 MG capsule Take 1 capsule (40 mg total) by mouth daily. 90 capsule 3  . ondansetron (ZOFRAN) 8 MG tablet     . oxyCODONE (OXY IR/ROXICODONE) 5 MG immediate release tablet Take 1 tablet (5 mg total) by mouth every 6 (six) hours as needed for severe pain. 90 tablet 0  . potassium chloride SA (K-DUR,KLOR-CON) 20 MEQ tablet Take 1 tablet (20 mEq total) by mouth 2 (two) times daily. 180 tablet 0  .  pravastatin (PRAVACHOL) 40 MG tablet TAKE 1 TABLET BY MOUTH ONCE DAILY 30 tablet 3  . prochlorperazine (COMPAZINE) 10 MG tablet     . tiZANidine (ZANAFLEX) 2 MG tablet Take 1 tablet (2 mg total) by mouth every 6 (six) hours as needed (Back Muscle Spasm). 30 tablet 3  . [DISCONTINUED] buPROPion (WELLBUTRIN XL) 150 MG 24 hr tablet Take 1 tablet (150 mg total) by mouth 2 (two) times daily. 60 tablet 5   No current facility-administered medications for this visit.    Functional Status:  In your present state of health, do you have any difficulty performing the following activities: 11/08/2014 10/30/2014  Hearing? N N  Vision? N N  Difficulty concentrating or making decisions? N N  Walking or climbing stairs? N N  Dressing or bathing? N N  Doing errands, shopping? N N  Preparing Food and eating ? - N  Using the Toilet? - N  In the past six months, have you accidently leaked urine? - N  Do you have problems with loss of bowel control? - N  Managing your Medications? - N  Managing your Finances? - N  Housekeeping or managing your Housekeeping? - Y    Fall/Depression Screening:  PHQ 2/9 Scores 11/08/2014 10/30/2014 12/26/2013  PHQ - 2 Score 0 0  0    Assessment: CSW met with patient in her home.  Per patient she is in financial difficulty due to high medical expenses and household responsibilities.  Per patient, after all of her bills are paid she has approximately $200.00 left over for the remainder of the month. Patient lives alone in [place], per patient her rent will be increasing.   Per patient, she applied for Medicaid, however does not meet the income require ments to qualify for full Medicaid.  Patient has been approved for Medicaid Part B and receives extra help with her medications.   Patient has made payment arrangements for several of her bills, however has incurred additional medical bills.  Patient encouraged to return calls received from the business office to make payment  arrangements.    Patient has a history of receiving food stamps, however her food stamp benefit  was cut due to her income. Food pantry resources provided to patient, however per patient, she does not like the food that they provide, use not likely.  Medical alert systems dicussed.  [employer] of  contacted during the visit.  Contact information given for [employer] (29.95)  and  [employer] (39.95). .  There are no community agencies that offer assistance with the cost for this service.    Patient has grab bars in her bathroom, however states that the problem is when she gets into the tub to soak she is unable to get out.  Per patient, she was not looking for a shower or bath chair as she was looking for something to assist more with allowing her to take an actually bath.     Plan:   Food stamp application completed and will be re-submitted. CSW to re-visit Medicaid application re-submission.   [facility] CM Care Plan Problem One        Patient Outreach Telephone from 10/30/2014 in [place] Problem One  Financial Issues associated to cost of health care.    Care Plan for Problem One  Active   [facility] CM Short Term Goal #1 (0-30 days)  Patient will work with SW for possible resources to relieve strss associated to high cost of health care within 30 days.    [facility] CM Short Term Goal #1 Start Date  10/30/14   Interventions for Short Term Goal #1  RN CM sent [facility] SW referral (See note 10/30/2014)    [facility] CM Care Plan Problem Two        Patient Outreach Telephone from 10/30/2014 in [place] Problem Two  Cancer with endless Chemo plan.    Care Plan for Problem Two  Active   Interventions for Problem Two Long Term Goal   RN CM will review Long term care options in the home or other facility within 90 days.    [facility] Long Term Goal (31-90) days  Patient will verbalize long term care plan and caregiver services should level of care demand a change  within 90 days.    [facility] Long Term Goal Start Date  10/30/14   [facility] CM Short Term Goal #1 (0-30 days)  RN CM will review Advance Directives, Living Will decisions within 30 days.    [facility] CM Short Term Goal #1 Start Date  10/30/14    [facility] CM Care Plan Problem Three        Patient Outreach Telephone from 10/30/2014 in [place] Problem Three  High Blood Pressure - (patient unable to state her BP).    Care Plan for Problem Three  Active   [facility] CM Short Term Goal #1 (0-30 days)  Patient will state her awareness of blood pressure reading within 30 days.    [facility] CM Short Term Goal #1 Start Date  10/30/14   Interventions for Short Term Goal #1  RN CM will teach patient the importance of knowing BP reading within 30 days.      Note routed to providers office.  [name] [facility] Care Management [phone]

## 2014-11-28 ENCOUNTER — Ambulatory Visit (HOSPITAL_BASED_OUTPATIENT_CLINIC_OR_DEPARTMENT_OTHER): Payer: Commercial Managed Care - HMO | Admitting: Hematology and Oncology

## 2014-11-28 ENCOUNTER — Ambulatory Visit: Payer: Commercial Managed Care - HMO

## 2014-11-28 ENCOUNTER — Encounter: Payer: Self-pay | Admitting: *Deleted

## 2014-11-28 ENCOUNTER — Telehealth: Payer: Self-pay | Admitting: Hematology and Oncology

## 2014-11-28 ENCOUNTER — Encounter: Payer: Self-pay | Admitting: Hematology and Oncology

## 2014-11-28 ENCOUNTER — Other Ambulatory Visit (HOSPITAL_BASED_OUTPATIENT_CLINIC_OR_DEPARTMENT_OTHER): Payer: Commercial Managed Care - HMO

## 2014-11-28 ENCOUNTER — Ambulatory Visit (HOSPITAL_BASED_OUTPATIENT_CLINIC_OR_DEPARTMENT_OTHER): Payer: Commercial Managed Care - HMO

## 2014-11-28 ENCOUNTER — Other Ambulatory Visit: Payer: Self-pay

## 2014-11-28 VITALS — BP 128/69 | HR 78 | Temp 98.2°F | Resp 18 | Ht 64.0 in | Wt 163.9 lb

## 2014-11-28 DIAGNOSIS — C3412 Malignant neoplasm of upper lobe, left bronchus or lung: Secondary | ICD-10-CM

## 2014-11-28 DIAGNOSIS — Z720 Tobacco use: Secondary | ICD-10-CM

## 2014-11-28 DIAGNOSIS — Z5111 Encounter for antineoplastic chemotherapy: Secondary | ICD-10-CM | POA: Diagnosis not present

## 2014-11-28 DIAGNOSIS — R600 Localized edema: Secondary | ICD-10-CM

## 2014-11-28 DIAGNOSIS — C7949 Secondary malignant neoplasm of other parts of nervous system: Secondary | ICD-10-CM

## 2014-11-28 DIAGNOSIS — C7931 Secondary malignant neoplasm of brain: Secondary | ICD-10-CM

## 2014-11-28 DIAGNOSIS — Z95828 Presence of other vascular implants and grafts: Secondary | ICD-10-CM

## 2014-11-28 DIAGNOSIS — R5383 Other fatigue: Secondary | ICD-10-CM | POA: Diagnosis not present

## 2014-11-28 DIAGNOSIS — D630 Anemia in neoplastic disease: Secondary | ICD-10-CM

## 2014-11-28 DIAGNOSIS — F17200 Nicotine dependence, unspecified, uncomplicated: Secondary | ICD-10-CM

## 2014-11-28 HISTORY — DX: Other fatigue: R53.83

## 2014-11-28 LAB — COMPREHENSIVE METABOLIC PANEL (CC13)
ALK PHOS: 120 U/L (ref 40–150)
ALT: 10 U/L (ref 0–55)
AST: 14 U/L (ref 5–34)
Albumin: 2.9 g/dL — ABNORMAL LOW (ref 3.5–5.0)
Anion Gap: 9 mEq/L (ref 3–11)
BUN: 7.6 mg/dL (ref 7.0–26.0)
CALCIUM: 9.1 mg/dL (ref 8.4–10.4)
CO2: 26 mEq/L (ref 22–29)
CREATININE: 1.1 mg/dL (ref 0.6–1.1)
Chloride: 106 mEq/L (ref 98–109)
EGFR: 61 mL/min/{1.73_m2} — ABNORMAL LOW (ref >90)
GLUCOSE: 96 mg/dL (ref 70–140)
Potassium: 4 mEq/L (ref 3.5–5.1)
SODIUM: 142 meq/L (ref 136–145)
Total Bilirubin: 0.43 mg/dL (ref 0.20–1.20)
Total Protein: 7.1 g/dL (ref 6.4–8.3)

## 2014-11-28 LAB — CBC WITH DIFFERENTIAL/PLATELET
BASO%: 1 % (ref 0.0–2.0)
Basophils Absolute: 0.1 10*3/uL (ref 0.0–0.1)
EOS ABS: 0.1 10*3/uL (ref 0.0–0.5)
EOS%: 1.7 % (ref 0.0–7.0)
HEMATOCRIT: 33.3 % — AB (ref 34.8–46.6)
HGB: 10.8 g/dL — ABNORMAL LOW (ref 11.6–15.9)
LYMPH%: 16.5 % (ref 14.0–49.7)
MCH: 26.9 pg (ref 25.1–34.0)
MCHC: 32.3 g/dL (ref 31.5–36.0)
MCV: 83.1 fL (ref 79.5–101.0)
MONO#: 0.7 10*3/uL (ref 0.1–0.9)
MONO%: 14.2 % — ABNORMAL HIGH (ref 0.0–14.0)
NEUT%: 66.6 % (ref 38.4–76.8)
NEUTROS ABS: 3.3 10*3/uL (ref 1.5–6.5)
Platelets: 360 10*3/uL (ref 145–400)
RBC: 4 10*6/uL (ref 3.70–5.45)
RDW: 20.4 % — ABNORMAL HIGH (ref 11.2–14.5)
WBC: 5 10*3/uL (ref 3.9–10.3)
lymph#: 0.8 10*3/uL — ABNORMAL LOW (ref 0.9–3.3)

## 2014-11-28 MED ORDER — HEPARIN SOD (PORK) LOCK FLUSH 100 UNIT/ML IV SOLN
500.0000 [IU] | Freq: Once | INTRAVENOUS | Status: AC | PRN
  Administered 2014-11-28: 500 [IU]
  Filled 2014-11-28: qty 5

## 2014-11-28 MED ORDER — SODIUM CHLORIDE 0.9 % IV SOLN
Freq: Once | INTRAVENOUS | Status: AC
  Administered 2014-11-28: 11:00:00 via INTRAVENOUS
  Filled 2014-11-28: qty 4

## 2014-11-28 MED ORDER — SODIUM CHLORIDE 0.9 % IV SOLN
Freq: Once | INTRAVENOUS | Status: AC
  Administered 2014-11-28: 11:00:00 via INTRAVENOUS

## 2014-11-28 MED ORDER — SODIUM CHLORIDE 0.9 % IV SOLN
485.0000 mg/m2 | Freq: Once | INTRAVENOUS | Status: AC
  Administered 2014-11-28: 900 mg via INTRAVENOUS
  Filled 2014-11-28: qty 36

## 2014-11-28 MED ORDER — SODIUM CHLORIDE 0.9 % IJ SOLN
10.0000 mL | INTRAMUSCULAR | Status: DC | PRN
  Administered 2014-11-28: 10 mL
  Filled 2014-11-28: qty 10

## 2014-11-28 MED ORDER — SODIUM CHLORIDE 0.9 % IJ SOLN
10.0000 mL | INTRAMUSCULAR | Status: DC | PRN
  Administered 2014-11-28: 10 mL via INTRAVENOUS
  Filled 2014-11-28: qty 10

## 2014-11-28 NOTE — Patient Instructions (Signed)

## 2014-11-28 NOTE — Progress Notes (Signed)
[facility] OFFICE PROGRESS NOTE  Patient Care Team: [name], MD as PCP - General (Family Medicine) Gaye Pollack, MD (Cardiothoracic Surgery) Thea Silversmith, MD (Radiation Oncology) Dickie La, MD (Family Medicine) Heath Lark, MD as Consulting Physician (Hematology and Oncology) Standley Brooking, RN as [place] as [place], LCSW as Social Worker  SUMMARY OF ONCOLOGIC HISTORY: Oncology History   Colon cancer   Primary site: Colon and Rectum (Left)   Staging method: AJCC 7th Edition   Clinical: Stage I (T2, N0, M0) signed by Heath Lark, MD on 05/31/2013  2:39 PM   Pathologic: Stage I (T2, N0, cM0) signed by Heath Lark, MD on 05/31/2013  2:39 PM   Summary: Stage I (T2, N0, cM0) Lung cancer, EGFR/ALK negative, recurrence after initial resection to LN and brain   Primary site: Lung (Left)   Staging method: AJCC 7th Edition   Clinical: Stage IV (T1, N2, M1b) signed by Heath Lark, MD on 05/31/2013  2:26 PM   Pathologic: Stage IV (T1, N2, M1b) signed by Heath Lark, MD on 05/31/2013  2:26 PM   Summary: Stage IV (T1, N2, M1b)       Cancer of upper lobe of left lung   03/01/2007 Procedure Colonoscopy revealed abnormalities and biopsy show high-grade dysplasia   04/01/2007 Surgery She underwent sigmoid resection which showed T2 N0 colon cancer, and negative margins and all of 17 lymph nodes were negative   02/29/2008 Procedure Repeat surveillance colonoscopy was negative.   06/16/2010 Surgery She underwent left upper lobectomy we show well-differentiated adenocarcinoma of the lung, T1, N0, M0   03/18/2011 Procedure Repeat colonoscopy show multiple polyps but there were benign   10/19/2011 Procedure Biopsy of mediastinal lymph node came back positive for recurrence of lung cancer, EGFR and ALK negative   11/16/2011 - 12/14/2011 Chemotherapy She received concurrent  chemoradiation therapy with weekly carboplatin and Taxol.   11/16/2011 - 12/29/2011 Radiation Therapy She received radiation therapy with weekly chemotherapy   02/15/2012 Imaging MR of the brain showed a new intracranial metastases. This was subsequently treated with stereotactic radiosurgery.   03/07/2012 - 04/12/2013 Chemotherapy She received chemotherapy with maintainence Alimta every 3 weeks. Chemotherapy was discontinued due to profound fatigue   03/02/2013 Procedure She had therapeutic ultrasound guidance thoracentesis for pleural effusion that came back negative for cancer   05/04/2013 Procedure She had repeat ultrasound-guided thoracentesis again and cytology was negative   05/29/2013 Imaging Repeat CT scan of the chest, abdomen and pelvis show no evidence of disease but persistent right-sided pleural effusion   06/02/2013 Surgery The patient had placement of Pleurx catheter and subsequently underwent pleurodesis.   09/22/2013 Imaging Repeat CT scan show no evidence of active disease. There are nonspecific lymphadenopathy and she is placed on observation.   03/23/2014 Imaging Repeat CT scan of the chest, abdomen and pelvis show recurrence of cancer with widespread bilateral pulmonary metastasis.   05/14/2014 Imaging Imaging of the chest and brain were repeated due to delay of initiation of chemotherapy. Overall, chest CT scan show stable disease. MRI of the head was negative for recurrence   05/16/2014 - 07/18/2014 Chemotherapy  she completed 4 cycles of combination chemotherapy with carboplatin and Alimta   07/16/2014 Imaging  repeat CT scan of the chest, abdomen and pelvis show regression in the size of lung nodules.   10/16/2014 Imaging  repeat CT scan of the chest, abdomen and  pelvis show regression in the size of lung nodules.    Brain metastases treated with radiosurgery   02/26/2012 Initial Diagnosis Brain metastases treated with radiosurgery    INTERVAL HISTORY: Please see below for problem  oriented charting. She returns for further follow-up. She complained of fatigue. Denies any chest pain or shortness of breath. No recent infection. Denies recent cough. Denies new neurological deficit.  REVIEW OF SYSTEMS:   Constitutional: Denies fevers, chills or abnormal weight loss Eyes: Denies blurriness of vision Ears, nose, mouth, throat, and face: Denies mucositis or sore throat Respiratory: Denies cough, dyspnea or wheezes Cardiovascular: Denies palpitation, chest discomfort or lower extremity swelling Gastrointestinal:  Denies nausea, heartburn or change in bowel habits Skin: Denies abnormal skin rashes Lymphatics: Denies new lymphadenopathy or easy bruising Neurological:Denies numbness, tingling or new weaknesses Behavioral/Psych: Mood is stable, no new changes  All other systems were reviewed with the patient and are negative.  I have reviewed the past medical history, past surgical history, social history and family history with the patient and they are unchanged from previous note.  ALLERGIES:  is allergic to adhesive and lisinopril.  MEDICATIONS:  Current Outpatient Prescriptions  Medication Sig Dispense Refill  . aspirin 81 MG chewable tablet Chew 81 mg by mouth daily.    . diclofenac sodium (VOLTAREN) 1 % GEL Apply 2 g topically 4 (four) times daily. As needed for pain 100 g 11  . FLUoxetine (PROZAC) 20 MG tablet Take 1 tablet (20 mg total) by mouth daily. 90 tablet 3  . folic acid (FOLVITE) 1 MG tablet Take 1 tablet (1 mg total) by mouth daily. 90 tablet 3  . furosemide (LASIX) 20 MG tablet Take 1 tablet (20 mg total) by mouth daily. 90 tablet 3  . lidocaine-prilocaine (EMLA) cream Apply topically as needed. Apply to porta cath site one hour prior to needle stick. 30 g 6  . mirtazapine (REMERON) 15 MG tablet Take 1 tablet (15 mg total) by mouth at bedtime. 90 tablet 0  . nebivolol (BYSTOLIC) 10 MG tablet Take 1 tablet (10 mg total) by mouth daily. 90 tablet 4  .  olopatadine (PATANOL) 0.1 % ophthalmic solution Place 1 drop into both eyes 1 day or 1 dose.    Marland Kitchen omeprazole (PRILOSEC) 40 MG capsule Take 1 capsule (40 mg total) by mouth daily. 90 capsule 3  . potassium chloride SA (K-DUR,KLOR-CON) 20 MEQ tablet Take 1 tablet (20 mEq total) by mouth 2 (two) times daily. 180 tablet 0  . pravastatin (PRAVACHOL) 40 MG tablet TAKE 1 TABLET BY MOUTH ONCE DAILY 30 tablet 3  . tiZANidine (ZANAFLEX) 2 MG tablet Take 1 tablet (2 mg total) by mouth every 6 (six) hours as needed (Back Muscle Spasm). 30 tablet 3  . traMADol (ULTRAM) 50 MG tablet Take by mouth every 6 (six) hours as needed.    Marland Kitchen albuterol (PROVENTIL HFA;VENTOLIN HFA) 108 (90 BASE) MCG/ACT inhaler Inhale 1 puff into the lungs 2 (two) times daily. (Patient not taking: Reported on 11/28/2014) 6.7 g prn  . nitroGLYCERIN (NITROSTAT) 0.4 MG SL tablet Place 1 tablet (0.4 mg total) under the tongue every 5 (five) minutes as needed. For chest pain (Patient not taking: Reported on 11/28/2014) 10 tablet 0  . Olopatadine HCl 0.2 % SOLN Apply 1 drop to eye daily. (Patient not taking: Reported on 11/08/2014) 5 mL 2  . ondansetron (ZOFRAN) 8 MG tablet     . oxyCODONE (OXY IR/ROXICODONE) 5 MG immediate release tablet Take 1 tablet (  5 mg total) by mouth every 6 (six) hours as needed for severe pain. (Patient not taking: Reported on 11/28/2014) 90 tablet 0  . prochlorperazine (COMPAZINE) 10 MG tablet     . [DISCONTINUED] buPROPion (WELLBUTRIN XL) 150 MG 24 hr tablet Take 1 tablet (150 mg total) by mouth 2 (two) times daily. 60 tablet 5   No current facility-administered medications for this visit.    PHYSICAL EXAMINATION: ECOG PERFORMANCE STATUS: 1 - Symptomatic but completely ambulatory  Filed Vitals:   11/28/14 1006  BP: 128/69  Pulse: 78  Temp: 98.2 F (36.8 C)  Resp: 18   Filed Weights   11/28/14 1006  Weight: 163 lb 14.4 oz (74.345 kg)    GENERAL:alert, no distress and comfortable SKIN: skin color, texture,  turgor are normal, no rashes or significant lesions EYES: normal, Conjunctiva are pink and non-injected, sclera clear OROPHARYNX:no exudate, no erythema and lips, buccal mucosa, and tongue normal  NECK: supple, thyroid normal size, non-tender, without nodularity LYMPH:  no palpable lymphadenopathy in the cervical, axillary or inguinal LUNGS: clear to auscultation and percussion with normal breathing effort HEART: regular rate & rhythm and no murmurs with mild bilateral lower extremity edema ABDOMEN:abdomen soft, non-tender and normal bowel sounds Musculoskeletal:no cyanosis of digits and no clubbing  NEURO: alert & oriented x 3 with fluent speech, no focal motor/sensory deficits  LABORATORY DATA:  I have reviewed the data as listed    Component Value Date/Time   NA 142 11/28/2014 0927   NA 141 12/26/2013 0946   NA 143 09/28/2011 0833   K 4.0 11/28/2014 0927   K 4.0 12/26/2013 0946   K 3.5 09/28/2011 0833   CL 106 12/26/2013 0946   CL 102 10/24/2012 1001   CL 98 09/28/2011 0833   CO2 26 11/28/2014 0927   CO2 27 12/26/2013 0946   CO2 30 09/28/2011 0833   GLUCOSE 96 11/28/2014 0927   GLUCOSE 101* 12/26/2013 0946   GLUCOSE 111* 10/24/2012 1001   GLUCOSE 112 09/28/2011 0833   BUN 7.6 11/28/2014 0927   BUN 9 12/26/2013 0946   BUN 9 09/28/2011 0833   CREATININE 1.1 11/28/2014 0927   CREATININE 0.83 12/26/2013 0946   CREATININE 0.7 09/28/2011 0833   CALCIUM 9.1 11/28/2014 0927   CALCIUM 8.8 12/26/2013 0946   CALCIUM 8.7 09/28/2011 0833   PROT 7.1 11/28/2014 0927   PROT 6.8 12/26/2013 0946   PROT 7.2 09/28/2011 0833   ALBUMIN 2.9* 11/28/2014 0927   ALBUMIN 3.8 12/26/2013 0946   AST 14 11/28/2014 0927   AST 12 12/26/2013 0946   AST 15 09/28/2011 0833   ALT 10 11/28/2014 0927   ALT <8 12/26/2013 0946   ALT 16 09/28/2011 0833   ALKPHOS 120 11/28/2014 0927   ALKPHOS 148* 12/26/2013 0946   ALKPHOS 99* 09/28/2011 0833   BILITOT 0.43 11/28/2014 0927   BILITOT 0.6 12/26/2013  0946   BILITOT 0.80 09/28/2011 0833   GFRNONAA 74* 06/12/2013 0625   GFRAA 86* 06/12/2013 0625    No results found for: SPEP, UPEP  Lab Results  Component Value Date   WBC 5.0 11/28/2014   NEUTROABS 3.3 11/28/2014   HGB 10.8* 11/28/2014   HCT 33.3* 11/28/2014   MCV 83.1 11/28/2014   PLT 360 11/28/2014      Chemistry      Component Value Date/Time   NA 142 11/28/2014 0927   NA 141 12/26/2013 0946   NA 143 09/28/2011 0833   K 4.0 11/28/2014 1610  K 4.0 12/26/2013 0946   K 3.5 09/28/2011 0833   CL 106 12/26/2013 0946   CL 102 10/24/2012 1001   CL 98 09/28/2011 0833   CO2 26 11/28/2014 0927   CO2 27 12/26/2013 0946   CO2 30 09/28/2011 0833   BUN 7.6 11/28/2014 0927   BUN 9 12/26/2013 0946   BUN 9 09/28/2011 0833   CREATININE 1.1 11/28/2014 0927   CREATININE 0.83 12/26/2013 0946   CREATININE 0.7 09/28/2011 0833      Component Value Date/Time   CALCIUM 9.1 11/28/2014 0927   CALCIUM 8.8 12/26/2013 0946   CALCIUM 8.7 09/28/2011 0833   ALKPHOS 120 11/28/2014 0927   ALKPHOS 148* 12/26/2013 0946   ALKPHOS 99* 09/28/2011 0833   AST 14 11/28/2014 0927   AST 12 12/26/2013 0946   AST 15 09/28/2011 0833   ALT 10 11/28/2014 0927   ALT <8 12/26/2013 0946   ALT 16 09/28/2011 0833   BILITOT 0.43 11/28/2014 0927   BILITOT 0.6 12/26/2013 0946   BILITOT 0.80 09/28/2011 0833      ASSESSMENT & PLAN:  Cancer of upper lobe of left lung  She tolerated treatment well. Recent Imaging study in May 2016 show regression in size of tumors. We will continue on treatment every 3 weeks indefinitely. Her next imaging study would be due in September 2016. She complained of fatigue. I will check her function tests but I do not think this is treatment related. I will continue the same treatment without dose adjustment.    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 Iron study in May 2016 is  adequate She is not symptomatic. She will continue vitamin B-12 injection every few months and folic acid supplementation.   Bilateral leg edema She has bilateral lower extremity edema, left greater than the right. This is likely related to low albumin with poor circulation. She will continue intermittently doses of Lasix as tolerated.   Brain metastases treated with radiosurgery Recent MRI is negative for disease recurrence. She is not symptomatic. Her next MRI is due in September 2016.     TOBACCO ABUSE I spent some time counseling the patient the importance of tobacco cessation. She is currently not interested to quit now.      Orders Placed This Encounter  Procedures  . CT Chest W Contrast    Standing Status: Future     Number of Occurrences:      Standing Expiration Date: 01/28/2016    Order Specific Question:  Reason for Exam (SYMPTOM  OR DIAGNOSIS REQUIRED)    Answer:  staging metastatic lung ca, assess response to Rx    Order Specific Question:  Preferred imaging location?    Answer:  [facility]  . CT Abdomen Pelvis W Contrast    Standing Status: Future     Number of Occurrences:      Standing Expiration Date: 02/28/2016    Order Specific Question:  Reason for Exam (SYMPTOM  OR DIAGNOSIS REQUIRED)    Answer:  staging metastatic lung ca, assess response to Rx    Order Specific Question:  Preferred imaging location?    Answer:  [facility]  . TSH    Standing Status: Future     Number of Occurrences:      Standing Expiration Date: 01/02/2016   All questions were answered. The patient knows to call the clinic with any problems, questions or concerns. No barriers  to learning was detected. I spent 30 minutes counseling the patient face to face. The total time spent in the appointment was 40 minutes and more than 50% was on counseling and review of test results     [facility], [place], MD 11/28/2014 10:49 AM

## 2014-11-28 NOTE — Assessment & Plan Note (Signed)
She has bilateral lower extremity edema, left greater than the right. This is likely related to low albumin with poor circulation. She will continue intermittently doses of Lasix as tolerated.

## 2014-11-28 NOTE — Telephone Encounter (Signed)
per pof to sch pt appt appt-gave pt copy of avs & contrast

## 2014-11-28 NOTE — Patient Outreach (Signed)
[facility] [facility]) Care Management  11/28/2014  Lauren [name] February 06, 1951 [id number]  Patient's completed food stamp application mailed to the [place]  [place], [place] [address] for processing.  The Department of Social Services should contact patient within the next 30 days.   [name] [facility] Care Management [phone]

## 2014-11-28 NOTE — Assessment & Plan Note (Signed)
Recent MRI is negative for disease recurrence. She is not symptomatic. Her next MRI is due in September 2016.

## 2014-11-28 NOTE — Assessment & Plan Note (Signed)
Anemia is multi-factorial, could be related to anemia of chronic disease & chemotherapy side effects. Iron study in May 2016 is adequate She is not symptomatic. She will continue vitamin B-12 injection every few months and folic acid supplementation.

## 2014-11-28 NOTE — Assessment & Plan Note (Signed)
I spent some time counseling the patient the importance of tobacco cessation. She is currently not interested to quit now. 

## 2014-11-28 NOTE — Assessment & Plan Note (Signed)
She tolerated treatment well. Recent Imaging study in May 2016 show regression in size of tumors. We will continue on treatment every 3 weeks indefinitely. Her next imaging study would be due in September 2016. She complained of fatigue. I will check her function tests but I do not think this is treatment related. I will continue the same treatment without dose adjustment.

## 2014-11-28 NOTE — Patient Outreach (Signed)
[facility]) Care Management  11/28/2014  Lauren [name] 12/17/1950 [id number]  Telephonic Monthly Care Management Assessment  Referral Date: 10/10/14 Referral Source: [employer]  Referral Issue: Cancer; having issues with CA that started in colon with mets to brain and lungs. Patient with reoccurrence twice in lungs and has been instructed she will need to have chemo every 3 weeks for the rest of her life or until her body is unable to handle it. Patient was with a [name] in the past that offered her a great deal of help and support.  [facility] [id number] Admissions: 0 ED: 1 Triage and Initial Assessment completed 10/30/2014. [place] SW Referral: 10/30/2014  PCP: Last appt 11/08/14 2:30pm [facility] Dr. Mc[facility]armid.  [place]:  Oncologist: Dr. Heath Lark (lung)- next appt 01/09/15  Social: Widowed for more than 10 years and living in [place]. Patient has 3 adult children: 1 daughter [name]) and two sons; (one living in [place] [facility] with Huntington's Disease and another son in [place], [place] with possible Huntington's Disease. Falls: 0. Patient is independent with ADLs. Walks with a cane as needed only.   C/O fatigue associated to side effects of Chemo therapy.  Transportation: owns a car and still driving.  Caregiver and Emergency contact: Daughter, [name], [phone] Financial Concerns: addressed with [facility] SW (please see SW notes for further details).  DME: [name], Talking scale provided by patients brother who worked for [employer] for [employer].   Advance Directive Yes, LW and HCPOA. H/O has a friend who has prepared her a requested CD with old school gospel music to be used at her funeral. 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  H/O reoccurrence twice in lungs and has been instructed she will need to have chemo every 3 weeks for the rest of her life or until her body is unable to handle it. Patient reported she was previously receiving monthly calls from [facility] that offered her a great deal of help and support.  Chemo: every 3 weeks (lung).  C/O fatigue associated with Chemo.  Patient states she has discussed with her oncologist who states will review scan again in August to September and may consider extending chemo sessions to give patient a break if scan is improved.  Radiation - none since brain cancer.  Patient is eating better and has gained 5 lbs this month. Weight 169 up from 164.4 a month ago.   HBP Last MD office BP reading: 132/61 on 10/17/14). Weight:169 up from 164.4  Ht 5'4" Patient reported H/O having a heart blockage but unable to give specific details.  Cardiologist: [place] - Last appt 2016 next appt - none scheduled.  Patient states she thinks she goes to see the Cardiologist too much and thinks once a year is enough. Patient expressed cost of co-pays interring with more regular appt's.  RN CM instructed to take medications as ordered and discussed importance of maintaining regular primary MD appt's.   COPD (2008) Managed by Primary MD. States no unmanaged symptoms at this time and using medications as ordered. Patient states she did see a Pulmonologist with the [facility] a long time ago but due to cost of Specialist co-pays; prefers to let her Primary MD manage. RN CM will mail Educational COPD Blue Folder to initiate disease management  education.  Instructed please review within the next 30 days and prepare to review on next RN CM contact call.   Pain  Reports Back issues starting around two years ago managed by Dr. Consuello Masse surgeon on [address] States MD made a referral to see Dr. Delice Lesch but patient refused because patient's pain had improved.  Reported numbness in left side of back since her back surgery two years ago. C/O cold temperatures causing her hands, back and left ankle to lock up. Patient does not report any knowledge of arthritis this call. Patient states she has medications she uses for symptom management.  States new pain in Left ankle with swelling.  RN CM instructed to contact Primary MD to report change in condition and schedule appt.   Medications:  New Med:  Baby shampoo cleaning of eyelids twice a day Pataday ophth soln 1 qtt each eye daily for allergic component.  Medication reconciliation completed this call.  Allergies: No medications identified but states allergy to all medical tapes.   Preventives: Flu Vaccine: 12/17/14 Pneumonia Vaccine: 2015 Shingles Vaccine: yes Mammogram 08/28/2014 Pap: Last pap (2015) and was advised no future pap's required. H/O hysterectomy years ago but patient does not know if ovaries were removed.  Bone density: unknown - patient does not recall ever getting a bone density. (Per Epic indicates completed but no date documented).  Colonoscopy: 2015  Urine specimen: Patient states she has never had a urine specimen checked and is not sure why no one has checked her urine. States "everyone thinks I am going to pass with cancer but I am concerned about my other health." (Per Epic Last U/A 12/21/11) RN CM instructed patient it is ok to ask MD to complete a urinalysis and RN CM will send request to MD to make him aware that patient would like to have a U/A completed on wellness appt.  Patient states she plans to discuss with Primary MD appt and will call and schedule appt.   Consent Patient gave verbal consent for [facility] Telephonic and [employer] as needed.  Patient states she has completed written consent on home SW visit and SW took with her.  Hard copy not noted in Media tab yet.  RN CM advised to please notify MD of any changes in her condition prior to scheduled  appt's.  RN CM provided contact name and #, 24-hour nurse line # [phone].  RN CM confirmed patient is aware of 911 services for urgent emergency needs. RN CM instructed in next RN CM phone follow-up call within one month.   Plan: Telephonic Care Coordination Services: Acuity Level 3 -remains active with [facility] SW services.  ED Mailing:  COPD Blue Folder.   [name], RN, BSN, [facility], CCM  [employer] Management Coordinator [phone] Office [phone] Direct [phone] Cell

## 2014-12-06 ENCOUNTER — Other Ambulatory Visit: Payer: Self-pay | Admitting: *Deleted

## 2014-12-06 ENCOUNTER — Telehealth: Payer: Self-pay

## 2014-12-06 DIAGNOSIS — J449 Chronic obstructive pulmonary disease, unspecified: Secondary | ICD-10-CM

## 2014-12-06 NOTE — Patient Outreach (Signed)
[facility] [facility]) Care Management  12/06/2014  Lauren [name] 06-01-1950 [id number]  Telephonic Care Management Note  RN CM notified [employer] LCSW, [employer] of inbound call from patient on 12/06/2014 and provided update regarding patients concern.   Plan:  Social Worker states she will check to see if she has this document and contact patient via phone call to assess if patient needs further assistance and / or understands how to request new form if needed.   [name], RN, BSN, [facility], CCM  [employer] Management Coordinator [phone] Office [phone] Direct [phone] Cell

## 2014-12-06 NOTE — Patient Outreach (Signed)
[place] [facility]) Care Management  12/06/2014  Lauren [name] 12-25-1950 [id number]  Telephonic Care Management Note  Housing Inbound call from patient stating she completed visit with social worker around 11/21/14 and thinks social worker may have accidentally picked up her copy of her letter regarding her new rental cost.  States she needs this document back and needs to submit a copy of this document to the Food Stamp contact before 12/14/14  Patient states she initially received a letter from the housing authority around June 2016 regarding a change in her (2) checks (disability and widow pension) indicating the (2) checks would be changing to patient only receiving (1) check.  Patient took this letter to the housing authority to provide them with this update.   States her rental cost increased from $310 to $318.   Patient states she received a 2nd letter from housing authority on 12/05/14 that her rent had increased from $310 to $389.  Patient went to the [place] this morning 12/05/2013 to discuss this update.   States authority showed she was still getting 2 checks and patient advised of update of only receiving her one check $1,306 per month.   Rent was then readjusted to $328 per month.    Food Stamps Patient has applied for food stamps and spoke with contact who advised she needs a copy of Rental letter by 12/14/14.  Contact instructed patient she would receive another food stamp card in the mail.   Plan RN CM will contact [name], [place] Management, [phone] to follow-up on the above.   [name], RN, BSN, [facility], CCM  [employer] Management Coordinator [phone] Office [phone] Direct [phone] Cell

## 2014-12-06 NOTE — Patient Outreach (Signed)
[facility]) Care Management  12/06/2014  Lauren [name] [birth year] [id number]   Phone call to patient to follow up on food stamp application.  Per patient she submitted by mail verification that her rent will be going up 12/17/14 today.  Per patient decision has not been made yet, once rent verification is received, the Department of [place] will have to finalize the calculations to make a final determination.  This CSW will follow up in 2 weeks.   [name] [facility] Care Management [phone]

## 2014-12-07 NOTE — Patient Outreach (Signed)
[place] [facility]) Care Management  12/07/2014  Lauren [name] Sep 19, 1950 [id number]   Telephone patient to check the status of the extra help application from social security. Patient stated social security informed her that she is already enrolled in the program. No further action is needed.  Jeferson Boozer L. Clayden Withem, [place] Care Management Assistant

## 2014-12-17 ENCOUNTER — Telehealth: Payer: Self-pay | Admitting: Hematology and Oncology

## 2014-12-17 NOTE — Telephone Encounter (Signed)
returned call and s.w pt and confirmed appt....pt ok and aware....pt tx missing emailed Maggie and cindy to see if we can add

## 2014-12-18 ENCOUNTER — Telehealth: Payer: Self-pay | Admitting: Hematology and Oncology

## 2014-12-18 NOTE — Telephone Encounter (Signed)
s.w. pt and advised on 8.4 appt....pt ok and aware,,she is aware that 8.3 lab and tx cx due to chemo room full

## 2014-12-19 ENCOUNTER — Other Ambulatory Visit: Payer: Self-pay

## 2014-12-19 ENCOUNTER — Encounter: Payer: Self-pay | Admitting: Pharmacist

## 2014-12-20 ENCOUNTER — Ambulatory Visit (INDEPENDENT_AMBULATORY_CARE_PROVIDER_SITE_OTHER): Payer: Commercial Managed Care - HMO | Admitting: Family Medicine

## 2014-12-20 ENCOUNTER — Ambulatory Visit: Payer: Commercial Managed Care - HMO

## 2014-12-20 ENCOUNTER — Ambulatory Visit (HOSPITAL_BASED_OUTPATIENT_CLINIC_OR_DEPARTMENT_OTHER): Payer: Commercial Managed Care - HMO

## 2014-12-20 ENCOUNTER — Encounter: Payer: Self-pay | Admitting: Family Medicine

## 2014-12-20 ENCOUNTER — Other Ambulatory Visit (HOSPITAL_BASED_OUTPATIENT_CLINIC_OR_DEPARTMENT_OTHER): Payer: Commercial Managed Care - HMO

## 2014-12-20 VITALS — BP 138/84 | HR 83 | Temp 98.2°F | Ht 64.0 in | Wt 167.1 lb

## 2014-12-20 VITALS — BP 163/75 | HR 75 | Temp 98.1°F | Resp 18

## 2014-12-20 DIAGNOSIS — R896 Abnormal cytological findings in specimens from other organs, systems and tissues: Secondary | ICD-10-CM

## 2014-12-20 DIAGNOSIS — Z5111 Encounter for antineoplastic chemotherapy: Secondary | ICD-10-CM | POA: Diagnosis not present

## 2014-12-20 DIAGNOSIS — Z720 Tobacco use: Secondary | ICD-10-CM

## 2014-12-20 DIAGNOSIS — I2510 Atherosclerotic heart disease of native coronary artery without angina pectoris: Secondary | ICD-10-CM

## 2014-12-20 DIAGNOSIS — I10 Essential (primary) hypertension: Secondary | ICD-10-CM

## 2014-12-20 DIAGNOSIS — E7800 Pure hypercholesterolemia, unspecified: Secondary | ICD-10-CM

## 2014-12-20 DIAGNOSIS — C3412 Malignant neoplasm of upper lobe, left bronchus or lung: Secondary | ICD-10-CM | POA: Diagnosis not present

## 2014-12-20 DIAGNOSIS — R5383 Other fatigue: Secondary | ICD-10-CM | POA: Diagnosis not present

## 2014-12-20 DIAGNOSIS — E039 Hypothyroidism, unspecified: Secondary | ICD-10-CM

## 2014-12-20 DIAGNOSIS — T451X5A Adverse effect of antineoplastic and immunosuppressive drugs, initial encounter: Secondary | ICD-10-CM

## 2014-12-20 DIAGNOSIS — G620 Drug-induced polyneuropathy: Secondary | ICD-10-CM

## 2014-12-20 DIAGNOSIS — F17200 Nicotine dependence, unspecified, uncomplicated: Secondary | ICD-10-CM

## 2014-12-20 DIAGNOSIS — I517 Cardiomegaly: Secondary | ICD-10-CM

## 2014-12-20 DIAGNOSIS — J449 Chronic obstructive pulmonary disease, unspecified: Secondary | ICD-10-CM

## 2014-12-20 DIAGNOSIS — Z95828 Presence of other vascular implants and grafts: Secondary | ICD-10-CM

## 2014-12-20 DIAGNOSIS — H01006 Unspecified blepharitis left eye, unspecified eyelid: Secondary | ICD-10-CM

## 2014-12-20 DIAGNOSIS — IMO0002 Reserved for concepts with insufficient information to code with codable children: Secondary | ICD-10-CM

## 2014-12-20 DIAGNOSIS — H01003 Unspecified blepharitis right eye, unspecified eyelid: Secondary | ICD-10-CM

## 2014-12-20 DIAGNOSIS — F330 Major depressive disorder, recurrent, mild: Secondary | ICD-10-CM

## 2014-12-20 HISTORY — DX: Cardiomegaly: I51.7

## 2014-12-20 LAB — COMPREHENSIVE METABOLIC PANEL (CC13)
ALBUMIN: 2.9 g/dL — AB (ref 3.5–5.0)
ALK PHOS: 112 U/L (ref 40–150)
ALT: 13 U/L (ref 0–55)
AST: 15 U/L (ref 5–34)
Anion Gap: 6 mEq/L (ref 3–11)
BUN: 12.1 mg/dL (ref 7.0–26.0)
CHLORIDE: 110 meq/L — AB (ref 98–109)
CO2: 28 mEq/L (ref 22–29)
Calcium: 8.8 mg/dL (ref 8.4–10.4)
Creatinine: 1 mg/dL (ref 0.6–1.1)
EGFR: 69 mL/min/{1.73_m2} — ABNORMAL LOW (ref >90)
Glucose: 93 mg/dl (ref 70–140)
Potassium: 4 mEq/L (ref 3.5–5.1)
SODIUM: 144 meq/L (ref 136–145)
TOTAL PROTEIN: 6.9 g/dL (ref 6.4–8.3)
Total Bilirubin: 0.22 mg/dL (ref 0.20–1.20)

## 2014-12-20 LAB — LIPID PANEL
CHOL/HDL RATIO: 4.1 ratio (ref <=5.0)
Cholesterol: 192 mg/dL (ref 125–200)
HDL: 47 mg/dL (ref >=46)
LDL Cholesterol: 126 mg/dL (ref <130)
TRIGLYCERIDES: 97 mg/dL (ref <150)
VLDL: 19 mg/dL (ref <30)

## 2014-12-20 LAB — CBC WITH DIFFERENTIAL/PLATELET
BASO%: 1 % (ref 0.0–2.0)
BASOS ABS: 0 10*3/uL (ref 0.0–0.1)
EOS ABS: 0.1 10*3/uL (ref 0.0–0.5)
EOS%: 1.6 % (ref 0.0–7.0)
HCT: 33.1 % — ABNORMAL LOW (ref 34.8–46.6)
HEMOGLOBIN: 10.6 g/dL — AB (ref 11.6–15.9)
LYMPH#: 0.9 10*3/uL (ref 0.9–3.3)
LYMPH%: 20.6 % (ref 14.0–49.7)
MCH: 26.7 pg (ref 25.1–34.0)
MCHC: 31.9 g/dL (ref 31.5–36.0)
MCV: 83.7 fL (ref 79.5–101.0)
MONO#: 0.5 10*3/uL (ref 0.1–0.9)
MONO%: 12.2 % (ref 0.0–14.0)
NEUT%: 64.6 % (ref 38.4–76.8)
NEUTROS ABS: 2.7 10*3/uL (ref 1.5–6.5)
Platelets: 334 10*3/uL (ref 145–400)
RBC: 3.95 10*6/uL (ref 3.70–5.45)
RDW: 19.7 % — ABNORMAL HIGH (ref 11.2–14.5)
WBC: 4.2 10*3/uL (ref 3.9–10.3)

## 2014-12-20 LAB — TSH CHCC: TSH: 1.586 m(IU)/L (ref 0.308–3.960)

## 2014-12-20 LAB — TSH: TSH: 1.911 u[IU]/mL (ref 0.350–4.500)

## 2014-12-20 MED ORDER — CYANOCOBALAMIN 1000 MCG/ML IJ SOLN
INTRAMUSCULAR | Status: AC
  Filled 2014-12-20: qty 1

## 2014-12-20 MED ORDER — HEPARIN SOD (PORK) LOCK FLUSH 100 UNIT/ML IV SOLN
500.0000 [IU] | Freq: Once | INTRAVENOUS | Status: AC | PRN
  Administered 2014-12-20: 500 [IU]
  Filled 2014-12-20: qty 5

## 2014-12-20 MED ORDER — SODIUM CHLORIDE 0.9 % IV SOLN
Freq: Once | INTRAVENOUS | Status: AC
  Administered 2014-12-20: 13:00:00 via INTRAVENOUS
  Filled 2014-12-20: qty 4

## 2014-12-20 MED ORDER — PEMETREXED DISODIUM CHEMO INJECTION 500 MG
500.0000 mg/m2 | Freq: Once | INTRAVENOUS | Status: AC
  Administered 2014-12-20: 925 mg via INTRAVENOUS
  Filled 2014-12-20: qty 37

## 2014-12-20 MED ORDER — NITROGLYCERIN 0.4 MG SL SUBL: 0.4000 mg | SUBLINGUAL_TABLET | SUBLINGUAL | Status: DC | PRN

## 2014-12-20 MED ORDER — SODIUM CHLORIDE 0.9 % IV SOLN
Freq: Once | INTRAVENOUS | Status: AC
  Administered 2014-12-20: 13:00:00 via INTRAVENOUS

## 2014-12-20 MED ORDER — CYANOCOBALAMIN 1000 MCG/ML IJ SOLN
1000.0000 ug | Freq: Once | INTRAMUSCULAR | Status: AC
  Administered 2014-12-20: 1000 ug via INTRAMUSCULAR

## 2014-12-20 MED ORDER — SODIUM CHLORIDE 0.9 % IJ SOLN
10.0000 mL | INTRAMUSCULAR | Status: DC | PRN
  Administered 2014-12-20: 10 mL
  Filled 2014-12-20: qty 10

## 2014-12-20 MED ORDER — SODIUM CHLORIDE 0.9 % IJ SOLN
10.0000 mL | INTRAMUSCULAR | Status: DC | PRN
  Administered 2014-12-20: 10 mL via INTRAVENOUS
  Filled 2014-12-20: qty 10

## 2014-12-20 NOTE — Patient Instructions (Signed)

## 2014-12-20 NOTE — Patient Instructions (Signed)
[facility] Discharge Instructions for Patients Receiving Chemotherapy  Today you received the following chemotherapy agents; Alimta.   To help prevent nausea and vomiting after your treatment, we encourage you to take your nausea medication as directed.    If you develop nausea and vomiting that is not controlled by your nausea medication, call the clinic.   BELOW ARE SYMPTOMS THAT SHOULD BE REPORTED IMMEDIATELY:  *FEVER GREATER THAN 100.5 F  *CHILLS WITH OR WITHOUT FEVER  NAUSEA AND VOMITING THAT IS NOT CONTROLLED WITH YOUR NAUSEA MEDICATION  *UNUSUAL SHORTNESS OF BREATH  *UNUSUAL BRUISING OR BLEEDING  TENDERNESS IN MOUTH AND THROAT WITH OR WITHOUT PRESENCE OF ULCERS  *URINARY PROBLEMS  *BOWEL PROBLEMS  UNUSUAL RASH Items with * indicate a potential emergency and should be followed up as soon as possible.  Feel free to call the clinic you have any questions or concerns. The clinic phone number is [phone].  Please show the CHEMO ALERT CARD at check-in to the Emergency Department and triage nurse.   

## 2014-12-20 NOTE — Patient Instructions (Signed)
Your blood pressure looks good.  Keep taking your medications like you are.  Pick up a compression ankle sleeve from any medical supply shop.

## 2014-12-21 ENCOUNTER — Encounter: Payer: Self-pay | Admitting: Family Medicine

## 2014-12-24 ENCOUNTER — Encounter: Payer: Self-pay | Admitting: Family Medicine

## 2014-12-24 ENCOUNTER — Other Ambulatory Visit: Payer: Self-pay | Admitting: Family Medicine

## 2014-12-24 DIAGNOSIS — Z820 Family history of epilepsy and other diseases of the nervous system: Secondary | ICD-10-CM | POA: Insufficient documentation

## 2014-12-24 MED ORDER — TRAMADOL HCL 50 MG PO TABS: 50.0000 mg | ORAL_TABLET | Freq: Four times a day (QID) | ORAL | Status: DC | PRN

## 2014-12-24 NOTE — Assessment & Plan Note (Signed)
Established problem. In precontemplatve phase of change. The patient was counseled on the dangers of tobacco use, and was advised to quit and reluctant to quit.  Offered our assistance should she become interested in smoking cessation.

## 2014-12-24 NOTE — Assessment & Plan Note (Signed)
Patient declined Pap smear

## 2014-12-24 NOTE — Assessment & Plan Note (Signed)
New question Further workup shows no evidence of hypothyroidism  Hypothyroidism removed from problem list.

## 2014-12-24 NOTE — Assessment & Plan Note (Signed)
Established problem. Stable. Continue current therapy with prn tramadol

## 2014-12-24 NOTE — Assessment & Plan Note (Addendum)
Established problem that is stable.  COPD Class A. Stable.  Continue PRN Albuterol MDI

## 2014-12-24 NOTE — Progress Notes (Signed)
Subjective:    Patient ID: Lauren [name], female    DOB: September 02, 1950, 64 y.o.   MRN: [id number]  Lauren [name] was unaccompanied for office visit.  She and her EMR were source of information for visit.   HPI Problem List Items Addressed This Visit    HYPERTENSION, BENIGN ESSENTIAL CHRONIC HYPERTENSION  Disease Monitoring  Blood pressure range: not checking at home  Chest pain: no   Dyspnea: no   Claudication: no   Medication compliance: yes  Medication Side Effects  Lightheadedness: no   Urinary frequency: no   Edema: yes, chronic     Preventitive Healthcare:  Exercise: yes   Diet Pattern: limited salt      Relevant Medications   nitroGLYCERIN (NITROSTAT) 0.4 MG SL tablet   Coronary atherosclerosis - Longstanding - taking antihypertensives, statin, Aspirin,  - No chest pain, No use of NTG SL - No DOE or CP interfering with ADLs and iADLs   Relevant Medications   nitroGLYCERIN (NITROSTAT) 0.4 MG SL tablet     COPD (chronic obstructive pulmonary disease) - Longstanding issue - Has not needed to use albuterol mdi in longtime - Continues to smoke - No cough, no sputum, - Able to walk flat surface without having to stop for breath.  No severe exacerbations in a year   Cancer of upper lobe of left lung, Adenocarcinoma (Chronic) - Diagnosis in 2013, Lung adenocarcinoma (U3A4T3M) - S/P LUL lobectomy 2012 and round of chemotherapy in - Recurrence twice, last recurrence noted on Chest CT 03/2014. -  - Recurrence   Relevant Medications   traMADol (ULTRAM) 50 MG tablet     Hypothyroidism - Pt has diagnosis of hypothyroidism on her problem list but she is not aware of a diagnosis of hypothyroidism - She is not taking a thyroid hormone supplement Symptoms consist of denies fatigue, weight changes, heat/cold intolerance, bowel/skin changes or CVS symptoms.  - 5 previous measures of TSH going back to 2010 have all been in normal serum ranges.     Relevant Orders   TSH (Completed)     Neuropathy due to chemotherapeutic drug She describes symptoms of burning. Onset of symptoms was after taking chemotherapy carboplatin and Taxol in 2013. Symptoms are currently of mild and moderate severity. Symptoms occur all day and last continuously. The patient denies numbness, cramping, hypersensitivity and allodynia. Symptoms are symmetric and worse on the left side. She denies  orthostasis. Previous treatment has included tramadol, which has improved symptoms.      Other   TOBACCO ABUSE History  Substance Use Topics  . Smoking status: Current Every Day Smoker -- 1.00 packs/day    Types: Cigarettes  . Smokeless tobacco: Never Used  . Alcohol Use: 0.0 oz/week    0 Standard drinks or equivalent per week     Comment: occasional  Pt denies desire to quit smoking at this time.      HYPERCHOLESTEROLEMIA - Primary Disease Monitoring: See symptoms for Hypertension  Medications: Pravastatin 40 mg daily  Compliance- yes RUQ pain- no  Muscle aches- no    ROS See HPI above   PMH Smoking Status noted       Relevant Medications   nitroGLYCERIN (NITROSTAT) 0.4 MG SL tablet   Other Relevant Orders   Lipid Panel (Completed)   Health care maintenance - Dilated eye exam last year.  - Declines Pap   DEPRESSIVE DISORDER, MAJOR, RCR, MILD - longstanding - Taking mirtazapine daily.  Is not taking fluoxetine. - PHQ-2 =  0 - Denies SI/HI, No rash, no anxiousness --    Blepharitis of both eyes - Presnet for about 6 weeks. - No improvement with Pataday eye drops nor cipro ophth drops. - Primary sympton is sticky thick drainage in morning  - treating with an eyelid wash and wipe solution.  - No pain or itching.  - no change in vision       Review of Systems   See HPI above   PMH Smoking Status noted     Objective:   Physical Exam VS reviewed GEN: Alert, Cooperative, Groomed, NAD HEENT: PERRL; EAC bilaterally occluded with cerumen - after irrigation  able to see TM's translucent with normal LM, (+) LR; No cervical LAN, No thyromegaly, No palpable masses. Eyelids bilateral skin thickening with Denne lines. Adherent crust to one lower lid lash.  No misdirected eyelashes.  COR: RRR, No M/G/R, No JVD, Normal PMI size and location LUNGS: BCTA, No Acc mm use, speaking in full sentences EXT: trace bilateral peripheral leg edema. Feet without deformity or lesions. Palpable bilateral pedal pulses.  Intact sensation to monofilament touch 2/5 points bilateral soles. Callus formation at heels.   SKIN: No lesion nor rashes of face Gait: Normal speed, No significant path deviation, Step through +,  Psych: Normal affect/thought/speech/language    Assessment & Plan:  See problem list

## 2014-12-24 NOTE — Assessment & Plan Note (Addendum)
Established problem Stable.  Continue Mirtazapine

## 2014-12-24 NOTE — Assessment & Plan Note (Signed)
Established problem No improvement with antihistamine drops May represent rosacea or seborrhea. If continues to be a problem will try topical erythromycin ointment QID.

## 2014-12-24 NOTE — Assessment & Plan Note (Addendum)
Established problem Stable.  Asymptomatic. Continue current medications. Renewed NTG SL. Cardiology has stated that she is not a candidate for invasive interventions.

## 2014-12-26 ENCOUNTER — Other Ambulatory Visit: Payer: Self-pay

## 2014-12-26 NOTE — Patient Outreach (Signed)
[facility]) Care Management  12/26/2014  Lauren [name] [birth year] [id number]  Telephonic Care Management Note  Outreach call #1 for monthly assessment.  Patient not reached.  RN CM left HIPAA compliant voice message with name and contact #.  RN CM rescheduled for next outreach call within one week.   [name], RN, BSN, [facility], CCM  [employer] Management Coordinator [phone] Direct [phone] Cell [phone] Office [phone] Fax

## 2014-12-31 ENCOUNTER — Other Ambulatory Visit: Payer: Self-pay

## 2014-12-31 ENCOUNTER — Telehealth: Payer: Self-pay

## 2014-12-31 NOTE — Patient Outreach (Signed)
[facility]) Care Management  12/31/2014  Lauren [name] 03/30/1951 [id number]   Telephonic Care Coordination Note:  Issue:  Patient has new MD order for ankle sleeves to assist with management of swelling and discomfort.   Patient unsuccessful with communicating with vendor regarding ordering item and delivery date.    RN CM attempted outbound call and left voice message requesting call back  [name], RN, BSN, [facility], [place] Management Care Management Coordinator [phone] Direct [phone] Cell [phone] Office [phone] Fax.

## 2014-12-31 NOTE — Patient Outreach (Signed)
[facility]) Care Management  12/31/2014  Lauren [name] [birth year] [id number]   Telephonic Monthly Care Management Assessment  Referral Date: 10/10/14 Referral Source: [employer]  Referral Issue: Cancer; having issues with CA that started in colon with mets to brain and lungs. Patient with reoccurrence twice in lungs and has been instructed she will need to have chemo every 3 weeks for the rest of her life or until her body is unable to handle it. Patient was with a [name] in the past that offered her a great deal of help and support.  Ins[facility]ine Igo [facility] [id number] Admissions: 0 ED: 1 Triage and Initial Assessment completed 10/30/2014. PCP:Dr. Dr. McDiarmid - last appt 12/24/14; next appt 06/2015  - [facility]  Oncologist: Dr. Heath Lark (lung)- next appt 01/09/15 [facility] Social Worker:  [place], LCSW  Social: Widowed for more than 10 years and living in [place]. Patient has 3 adult children: 1 daughter [name]) and two sons; (one living in [facility] with [place]'s Disease and another son in [place], [place] with possible [place]'s Disease. Falls: 0. Patient is independent with ADLs. Walks with a cane as needed only. C/O fatigue associated to side effects of Chemo therapy; states she sleeps a lot and avoids the heat.  Transportation: owns a car and still driving.  Caregiver and Emergency contact: Daughter, [name], [phone] Financial Concerns: addressed with [facility] SW (please see SW notes for further details).  DME: [name], Talking scale provided by patients brother who worked for [employer] for Health, Ankle sleeves pending.   Advance Directive Yes, LW and HCPOA. H/O has a friend who has prepared her a requested CD with old school gospel music to be used at her funeral. Patient wishes to be cremated  Cancer (2008) H/O having issues with CA  that started in colon with mets to brain and lungs. H/O reoccurrence twice in lungs and has been instructed she will need to have chemo every 3 weeks for the rest of her life or until her body is unable to handle it.  States plan to review scan again in August to September and may consider extending chemo sessions to give patient a break if scan is improved.  Radiation - none since brain cancer.  Patient states swelling in feet and ankles associated to lymph node removal during colon cancer surgery.  MD provided patient with a new order for ankle sleeves and will assess swelling again in 6 months.   HBP Last MD office BP reading: 132/61 on 10/17/14). Weight:165 Ht 5'4" Patient reported H/O having a heart blockage but unable to give specific details.  Cardiologist: [place] - Last appt 2016 next appt - none scheduled. H/O Patient states she thinks she goes to see the Cardiologist too much and thinks once a year is enough. Patient expressed cost of co-pays interring with more regular appt's.  RN CM instructed to take medications as ordered and discussed importance of maintaining regular primary MD appt's.  RN CM praised patient for getting weight back down from 169 to 165 over the past month.   COPD (2008) Managed by Primary MD. States no unmanaged symptoms at this time and using medications as ordered. H/O patient did see a Pulmonologist with the [facility] a long time ago but due to cost of Specialist co-pays; prefers to let her Primary MD manage. Smoker: yes; < 1 pack per day.  Educational COPD Blue Folder sent 11/28/14. (Reviewed 12/31/14).  RN CM discussed positive benefits of not smoking.  RN CM advised in [facility] Education and will mail to patient 12/31/14.  RN CM will review with patient on next call within 30 days.    Pain  H/O Back issues starting around two years ago managed by Dr. Consuello Masse surgeon on [address]. States MD made a referral to see Dr. Delice Lesch but patient  refused because patient's pain had improved. Reported numbness in left side of back since her back surgery two years ago. C/O cold temperatures causing her hands, back and left ankle to lock up. Patient does not report any knowledge of arthritis this call. Patient states she has medications she uses for symptom management.  RN CM instructed to contact Primary MD to report change in condition and schedule appt. (11/28/14)  Reviewed 01/01/15.  Patient followed up with PCP.  Patient ordered ankle sleeves and Tramadol for pain.  Patient in process of ordering ankle sleeves.  States she first went to [employer] who sized / fitted patient and advised patient may need a medium for one ankle and a large for the other.  States it was discovered that [employer] was not in net-work and she contacted [employer] who advised [employer] was in-network.  States she proceeded on to this provider who did not measure her but only ask her shoe size.  States she ordered the sleeves almost 2 weeks ago and has received no updates.  States provider asked her to pay prior to ordering but patient would not stating "Why would I want to do that when Medicare pays 80% and I only pay 20%."   RN CM attempted conference call to provider with patient on the line but was only able to leave voice mail at [phone].   RN CM will continue care coordination outreach to provider and update patient with outcome.    Blepharitis of both eyes Baby shampoo cleaning of eyelids twice a day Pataday ophthalmic soln 1 drop each eye daily for allergic component. . Patient states some improvement and MD advised to continue with treatment.  Patient confirms last eye appt completed about one year ago.   RN CM encouraged compliance with eye MD appts.   Preventives: Flu Vaccine: 12/17/14 Pneumonia Vaccine: 2015 Shingles Vaccine: yes Mammogram 08/28/2014 Pap: Last pap (2015) and was advised no  future pap's required. H/O hysterectomy years ago but patient does not know if ovaries were removed. H/O patient refused pap on 12/24/14 PCP visit.   Bone density: unknown - patient does not recall ever getting a bone density. (Per Epic indicates completed but no date documented).  Colonoscopy: 2015   Consent Patient gave verbal consent for [facility] Telephonic and [employer] as needed.  Written [facility] consent on file: see media tab.  RN CM advised to please notify MD of any changes in her condition prior to scheduled appt's.  RN CM provided contact name and #, 24-hour nurse line # [phone].  RN CM confirmed patient is aware of 911 services for urgent emergency needs. RN CM instructed in next RN CM phone follow-up call within one month and care coordination as needed.   Plan: -Telephonic Monthly Assessment within one month.  -Care Coordination:  DME - ankle sleeves -[facility] ED Mailing: Smoking Cessation   [name], RN, BSN, [facility], [place] Management Care Management Coordinator [phone] Direct [phone] Cell [phone] Office [phone] Fax

## 2015-01-09 ENCOUNTER — Other Ambulatory Visit (HOSPITAL_BASED_OUTPATIENT_CLINIC_OR_DEPARTMENT_OTHER): Payer: Commercial Managed Care - HMO

## 2015-01-09 ENCOUNTER — Ambulatory Visit (HOSPITAL_BASED_OUTPATIENT_CLINIC_OR_DEPARTMENT_OTHER): Payer: Commercial Managed Care - HMO

## 2015-01-09 ENCOUNTER — Ambulatory Visit: Payer: Commercial Managed Care - HMO

## 2015-01-09 VITALS — BP 136/62 | HR 72 | Temp 98.2°F | Resp 16

## 2015-01-09 VITALS — BP 137/83 | HR 64 | Temp 98.5°F | Resp 20

## 2015-01-09 DIAGNOSIS — C3412 Malignant neoplasm of upper lobe, left bronchus or lung: Secondary | ICD-10-CM

## 2015-01-09 DIAGNOSIS — Z5111 Encounter for antineoplastic chemotherapy: Secondary | ICD-10-CM

## 2015-01-09 LAB — CBC WITH DIFFERENTIAL/PLATELET
BASO%: 0.8 % (ref 0.0–2.0)
BASOS ABS: 0 10*3/uL (ref 0.0–0.1)
EOS%: 1.8 % (ref 0.0–7.0)
Eosinophils Absolute: 0.1 10*3/uL (ref 0.0–0.5)
HEMATOCRIT: 31.8 % — AB (ref 34.8–46.6)
HGB: 10.1 g/dL — ABNORMAL LOW (ref 11.6–15.9)
LYMPH#: 0.7 10*3/uL — AB (ref 0.9–3.3)
LYMPH%: 16.1 % (ref 14.0–49.7)
MCH: 26.9 pg (ref 25.1–34.0)
MCHC: 31.8 g/dL (ref 31.5–36.0)
MCV: 84.6 fL (ref 79.5–101.0)
MONO#: 0.6 10*3/uL (ref 0.1–0.9)
MONO%: 14 % (ref 0.0–14.0)
NEUT#: 2.7 10*3/uL (ref 1.5–6.5)
NEUT%: 67.3 % (ref 38.4–76.8)
PLATELETS: 305 10*3/uL (ref 145–400)
RBC: 3.76 10*6/uL (ref 3.70–5.45)
RDW: 19.5 % — ABNORMAL HIGH (ref 11.2–14.5)
WBC: 4.1 10*3/uL (ref 3.9–10.3)

## 2015-01-09 LAB — COMPREHENSIVE METABOLIC PANEL (CC13)
ALT: 11 U/L (ref 0–55)
AST: 13 U/L (ref 5–34)
Albumin: 2.8 g/dL — ABNORMAL LOW (ref 3.5–5.0)
Alkaline Phosphatase: 114 U/L (ref 40–150)
Anion Gap: 8 mEq/L (ref 3–11)
BUN: 8.1 mg/dL (ref 7.0–26.0)
CALCIUM: 9 mg/dL (ref 8.4–10.4)
CO2: 26 meq/L (ref 22–29)
Chloride: 108 mEq/L (ref 98–109)
Creatinine: 1.1 mg/dL (ref 0.6–1.1)
EGFR: 63 mL/min/{1.73_m2} — ABNORMAL LOW (ref >90)
Glucose: 95 mg/dl (ref 70–140)
POTASSIUM: 3.6 meq/L (ref 3.5–5.1)
Sodium: 143 mEq/L (ref 136–145)
Total Bilirubin: 0.33 mg/dL (ref 0.20–1.20)
Total Protein: 6.6 g/dL (ref 6.4–8.3)

## 2015-01-09 MED ORDER — SODIUM CHLORIDE 0.9 % IJ SOLN
10.0000 mL | INTRAMUSCULAR | Status: DC | PRN
  Administered 2015-01-09: 10 mL
  Filled 2015-01-09: qty 10

## 2015-01-09 MED ORDER — HEPARIN SOD (PORK) LOCK FLUSH 100 UNIT/ML IV SOLN
500.0000 [IU] | Freq: Once | INTRAVENOUS | Status: AC | PRN
  Administered 2015-01-09: 500 [IU]
  Filled 2015-01-09: qty 5

## 2015-01-09 MED ORDER — SODIUM CHLORIDE 0.9 % IV SOLN
485.0000 mg/m2 | Freq: Once | INTRAVENOUS | Status: AC
  Administered 2015-01-09: 900 mg via INTRAVENOUS
  Filled 2015-01-09: qty 36

## 2015-01-09 MED ORDER — SODIUM CHLORIDE 0.9 % IV SOLN
Freq: Once | INTRAVENOUS | Status: AC
  Administered 2015-01-09: 11:00:00 via INTRAVENOUS

## 2015-01-09 MED ORDER — SODIUM CHLORIDE 0.9 % IJ SOLN
10.0000 mL | INTRAMUSCULAR | Status: DC | PRN
  Administered 2015-01-09: 10 mL via INTRAVENOUS
  Filled 2015-01-09: qty 10

## 2015-01-09 MED ORDER — SODIUM CHLORIDE 0.9 % IV SOLN
Freq: Once | INTRAVENOUS | Status: AC
  Administered 2015-01-09: 11:00:00 via INTRAVENOUS
  Filled 2015-01-09: qty 4

## 2015-01-09 NOTE — Patient Instructions (Signed)
[facility] Discharge Instructions for Patients Receiving Chemotherapy  Today you received the following chemotherapy agents:  Alimta  To help prevent nausea and vomiting after your treatment, we encourage you to take your nausea medication as ordered per MD.   If you develop nausea and vomiting that is not controlled by your nausea medication, call the clinic.   BELOW ARE SYMPTOMS THAT SHOULD BE REPORTED IMMEDIATELY:  *FEVER GREATER THAN 100.5 F  *CHILLS WITH OR WITHOUT FEVER  NAUSEA AND VOMITING THAT IS NOT CONTROLLED WITH YOUR NAUSEA MEDICATION  *UNUSUAL SHORTNESS OF BREATH  *UNUSUAL BRUISING OR BLEEDING  TENDERNESS IN MOUTH AND THROAT WITH OR WITHOUT PRESENCE OF ULCERS  *URINARY PROBLEMS  *BOWEL PROBLEMS  UNUSUAL RASH Items with * indicate a potential emergency and should be followed up as soon as possible.  Feel free to call the clinic you have any questions or concerns. The clinic phone number is [phone].  Please show the CHEMO ALERT CARD at check-in to the Emergency Department and triage nurse.   

## 2015-01-14 ENCOUNTER — Other Ambulatory Visit: Payer: Self-pay

## 2015-01-14 DIAGNOSIS — C3412 Malignant neoplasm of upper lobe, left bronchus or lung: Secondary | ICD-10-CM

## 2015-01-14 NOTE — Patient Outreach (Signed)
[facility]) Care Management  01/14/2015  Lauren [name] 1951-01-26 [id number]   Request from [name], RN to assign SW and Charity fundraiser, assigned [employer], LCSW and [place], [place].  Thanks, [name]. [place], [place] Assistant Phone: [phone] Fax: [phone]

## 2015-01-14 NOTE — Patient Outreach (Addendum)
[facility]) Care Management  01/14/2015  CHAUNTELLE AZPEITIA 10-10-1950 [id number]  Telephonic Care Coordination Note:  Outbound call to patient to follow-up on voice mail received from patient stating continued issues obtaining ankle sleeve.   Patient reached and states she does not like dealing the contact at [facility].  States contact did not return a call back to her and she has not been able to reach him by phone since that time.  States he did not even measure her ankle for sizing.  States he told her cost would be $18.00 each after Medicare payment.  Provider wanted patient to pay upfront prior to ordering.    Patient contacted [employer] to get other providers in network.  States [address], [place], [place] [address] [phone] was provided but number is disconnected.   States all other providers are outside the [place] area and patient is unable to travel that far.   States she likes dealing with [employer] but provider is not in network.  States [place] quoted 19.00 cost to patient.   RN attempt to conference call [place] with patient on line.  # not working/fax tone.  RN attempted to then call [place] but invalid #.  RN CM goggled but no other # provided.  RN CM discussed options of less costly options of going to [employer] or [employer] to pay out of pocket for item.  RN CM discussed option of contacting [employer] and requesting approval / special consideration be granted to go to [place] if no other local providers inside the geographic area.  RN CM advised to notify [facility] of difficulties dealing with the provider at [facility].  RN CM discussed option of ACE Bandage Wrap and [facility] Community RN CM visit to assess ankle and teach ACE wrap if needed.  Patient agreed to [place] Referral.  RN CM advised in option of [facility] SW referral for Financial Assessment & provide possible resource  intervention to free up money through a possible food pantry in order to use money in another area to assist with cost of ankle sleeves or ace wraps. (Patient agreed to SW referral).  Medication: Low Income Subsidy / Extra Help application completed with [facility]. Patient was notified via social security 11/2014 that she is already enrolled in the program.  No further action is needed.  Plan:   [facility] RN CM referral -Care Coordination needs:  Ankle sleeve -Teach Ace Wrap intervention as less costly intervention until financial resource available for purchase and / or provider to order is determined.  [facility] SW referral -Financial Assessment -Resources such as food pantry or other resources to free up money for healthcare ankle sleeve purchase.   [name], RN, BSN, [facility], CCM  [employer] Management Coordinator [phone] Direct [phone] Cell [phone] Office [phone] Fax

## 2015-01-15 ENCOUNTER — Other Ambulatory Visit: Payer: Self-pay | Admitting: *Deleted

## 2015-01-15 NOTE — Patient Outreach (Signed)
[facility]) Care Management  01/15/2015  JANISHA BUESO 01-24-1951 [id number]  Phone call to patient to follow up on food stamp application.  Per patient, she now receives $16.00 in food stamps.  Food pantry options previously provided to patient.  Per telephonic nurse, patient needing compression supplies. Community RNCM assigned to patient's case.  This Education officer, museum will follow up with RNCM if social work inolvement is needed regarding the compression supplies.   [name] [facility] Care Management [phone]

## 2015-01-17 ENCOUNTER — Other Ambulatory Visit: Payer: Self-pay | Admitting: *Deleted

## 2015-01-17 NOTE — Patient Outreach (Signed)
[facility]) Care Management  01/17/2015  LYNELL GREENHOUSE [birth year] [id number]  CSW is providing social work coverage for [employer], [place] with [place] Management ([place] Management) in her absence.  CSW received a request from [name], Telephonic Nurse Case Manager, also with [place] Management, to assist patient with finances and the purchase of an ankle sleeve.  CSW made an initial attempt to try and contact patient today to perform phone assessment, as well as assess and assist with social work needs and services, without success. A HIPAA compliant message was left for patient on voicemail.  CSW is currently awaiting a return call.  CSW mailed a packet of resource information to patient's home, including all of the following:  Financial Assessment Form  List of Insurance risk surveyor Assistance  "The [place] in [place] Programs through [employer] of [place] brochure   Nat Christen, Artist, MSW, Advertising copywriter Health System  Mailing [place] [address] [place], [place] [address] Physical Address-[address], [place], [place] [address] Toll Free Main # [phone] Fax # [phone] Cell # [phone]  Fax # [phone]  [email or web address]

## 2015-01-24 ENCOUNTER — Other Ambulatory Visit: Payer: Self-pay | Admitting: *Deleted

## 2015-01-24 DIAGNOSIS — I10 Essential (primary) hypertension: Secondary | ICD-10-CM

## 2015-01-24 NOTE — Patient Outreach (Signed)
Referral received from telephonic care manager, C. Hutchinson, regarding member's problems with her swollen legs.  Member was to have ankle sleeves, but has been having a difficult time purchasing them.  The request was for this care manager to make a home visit and educate member on an [place] until she was able to obtain the sleeves.  Call placed to member to schedule appointment, member states that she went to the dollar store and found some sleeves and have been wearing those.  She reports that her swelling has decreased, but that she thinks she may need to see a podiatrist.  Member states that she does not need a home visit at this time, but is open to restarting with C. Hutchinson.  Will update care Primary school teacher and Mrs. Hutchinson, member will be placed back into a telephonic program.  Valente David, BSN, [place] Manager [phone]

## 2015-01-25 NOTE — Addendum Note (Signed)
Addended byValente David on: 01/25/2015 07:32 PM   Modules accepted: Orders

## 2015-01-28 ENCOUNTER — Other Ambulatory Visit: Payer: Self-pay

## 2015-01-28 NOTE — Patient Outreach (Signed)
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 Patient with reoccurrence twice in lungs and has been instructed she will need to have chemo every 3 weeks for the rest of her life or until her body is unable to handle it. Patient was with a [name] in the past that offered her a great deal of help and support.  InsuranceJosephine Igo [facility] [id number] Admissions: 0 ED: 1 PCP:Dr. Dr. McDiarmid - last appt 12/24/14; [facility]  Oncologist: Dr. Heath Lark (lung)- next appt 01/2015  Social: Widowed for more than 10 years and living in [place]. Patient has 3 adult children: 1 daughter [name]) and two sons; (one living in [facility] with Huntington's Disease and another son in [place], [place] with possible Huntington's Disease. Falls: 0. Patient is independent with ADLs. Walks with a cane as needed only. C/O fatigue associated to side effects of Chemo therapy; states she sleeps a lot and avoids the heat.  Transportation: owns a car and still driving.  Caregiver and Emergency contact: Daughter, [name], [phone] Financial Concerns: addressed with [facility] SW (please see SW notes for further details).  Patient confirms she has received documents mailed by SW.   States she received a call from someone but unable to identify agency/company or call back #.  States person scheduled appt to come out to patient's home 01/29/15 to discuss Medicare Assistance.  DME: [name], Talking scale provided by patients brother who worked for [employer] for [employer], Ankle support sleeves. Advance Directive Yes, LW and HCPOA. H/O has a friend who has  prepared her a requested CD with old school gospel music to be used at her funeral. Patient wishes to be cremated (10/30/14 Goal continued).  Patient will consider long term care plan and caregiver identification should her level of care require more care within 90 days.  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 H/O reoccurrence twice in lungs and has been instructed she will need to have chemo every 3 weeks for the rest of her life or until her body is unable to handle it.  States plan to review scan again in September and may consider extending chemo sessions to give patient a break if scan is improved. Radiation - none since brain cancer. H/o swelling in feet and ankles associated to lymph node removal during colon cancer surgery. MD provided patient with a new order for ankle sleeves and will assess swelling again in 6 months.  Patient has purchased sleeves from the dollar store for management.  Patient expresses concern regarding cooler weather coming and inability to wear a shoe.  States she has been wearing flipflop's.   RN CM reviewed self management tips:  Keep leg/foot elevated as much as possible.  Low salt diet.  Patient states she drinks regular Pepsi.  Patient states she is not able to tolerate the diet drinks due to the associated bad taste.    SCAN pending 01/29/15. STG:  Patient will attempt to reduce the amount of Pepsi consumed within the next 30 days.   RN CM provided education on management of sugar and salt to help self manage swelling as much as possible.  HBP Last MD office BP reading: 137/83 Weight:165 Ht 5'4" H/o Patient reported H/O having a heart blockage but unable to give specific details.  Cardiologist: [place] - Last appt 2016 next appt - none scheduled. H/O Patient states she thinks she goes to see the Cardiologist too much and thinks once a year is enough. Patient expressed cost of co-pays interring  with more regular appt's.   COPD (2008) Managed by Primary MD. States no unmanaged symptoms at this time and using medications as ordered. H/O patient did see a Pulmonologist with the [facility] a long time ago but due to cost of Specialist co-pays; prefers to let her Primary MD manage. Smoker: yes; < 1 pack per day.  Educational COPD Blue Folder sent 11/28/14. (Reviewed 12/31/14). Emmi Education sent 12/31/14 (Reviewed 01/28/15).   Blepharitis of both eyes Baby shampoo cleaning of eyelids twice a day Pataday ophthalmic soln 1 drop each eye daily for allergic component. . Patient states some improvement and MD advised to continue with treatment; patient reports she still has some blurred vision on occasion and will discuss with MD on next MD appt.   Patient confirms last eye appt completed about one year ago.   Preventives: Flu Vaccine: 12/17/14 Pneumonia Vaccine: 2015 Shingles Vaccine: yes Mammogram 08/28/2014 Pap: Last pap (2015) and was advised no future pap's required. H/O hysterectomy years ago but patient does not know if ovaries were removed. H/O patient refused pap on 12/24/14 PCP visit.  Bone density: unknown - patient does not recall ever getting a bone density. (Per Epic indicates completed but no date documented).  Colonoscopy: 2015   Consent Patient gave verbal consent for [facility] Telephonic and [employer] as needed.  Written [facility] consent on file: see media tab.   Plan: RN CM provided education on management of sugar and salt to help self manage swelling as much as possible.  (10/30/14 Goal continued).  Patient will consider long term care plan and caregiver identification should her level of care require more care within 90 days.    RN CM advised patient to not allow anyone into her home without complete disclosure of company name and call back #.  RN CM encouraged patient should verify who has scheduled appt in her home before allowing this person entry.   Care Coordination:  SW remains active.  RN CM provided [facility] SW contact # for follow-up and questions regarding mailed documents. RN CM instructed in next RN CM phone follow-up call within one month.  RN CM will follow until care coordination needs have resolved.  RN CM advised to please notify MD of any changes in her condition prior to scheduled appt's.  RN CM provided contact name and #, 24-hour nurse line # [phone].  RN CM confirmed patient is aware of 911 services for urgent emergency needs.  [name], RN, BSN, [facility], CCM  [employer] Management Coordinator [phone] Direct [phone] Cell [phone] Office [phone] Fax

## 2015-01-28 NOTE — Patient Outreach (Signed)
[facility]) Care Management  01/28/2015  Lauren [name] 1951/01/06 [id number]   Request from Valente David, RN to assign Telephonic Nursing, assigned Lavena Stanford, RN.  Thanks, [name]. [place], [place] Assistant Phone: [phone] Fax: [phone]

## 2015-01-29 ENCOUNTER — Ambulatory Visit (HOSPITAL_COMMUNITY)
Admission: RE | Admit: 2015-01-29 | Discharge: 2015-01-29 | Disposition: A | Discharge status: Home / Self Care | Payer: Commercial Managed Care - HMO | Source: Ambulatory Visit | Attending: Hematology and Oncology | Admitting: Hematology and Oncology

## 2015-01-29 ENCOUNTER — Encounter (HOSPITAL_COMMUNITY): Payer: Self-pay

## 2015-01-29 ENCOUNTER — Other Ambulatory Visit: Payer: Self-pay

## 2015-01-29 DIAGNOSIS — K439 Ventral hernia without obstruction or gangrene: Secondary | ICD-10-CM | POA: Diagnosis not present

## 2015-01-29 DIAGNOSIS — Z85038 Personal history of other malignant neoplasm of large intestine: Secondary | ICD-10-CM | POA: Diagnosis not present

## 2015-01-29 DIAGNOSIS — Z85841 Personal history of malignant neoplasm of brain: Secondary | ICD-10-CM | POA: Insufficient documentation

## 2015-01-29 DIAGNOSIS — C3490 Malignant neoplasm of unspecified part of unspecified bronchus or lung: Secondary | ICD-10-CM | POA: Diagnosis not present

## 2015-01-29 DIAGNOSIS — I7090 Unspecified atherosclerosis: Secondary | ICD-10-CM | POA: Diagnosis not present

## 2015-01-29 DIAGNOSIS — K429 Umbilical hernia without obstruction or gangrene: Secondary | ICD-10-CM | POA: Diagnosis not present

## 2015-01-29 DIAGNOSIS — C3412 Malignant neoplasm of upper lobe, left bronchus or lung: Secondary | ICD-10-CM

## 2015-01-29 MED ORDER — IOHEXOL 300 MG/ML  SOLN
50.0000 mL | Freq: Once | INTRAMUSCULAR | Status: AC | PRN
  Administered 2015-01-29: 50 mL via ORAL

## 2015-01-29 MED ORDER — IOHEXOL 300 MG/ML  SOLN
100.0000 mL | Freq: Once | INTRAMUSCULAR | Status: AC | PRN
  Administered 2015-01-29: 100 mL via INTRAVENOUS

## 2015-01-30 ENCOUNTER — Encounter: Payer: Self-pay | Admitting: Hematology and Oncology

## 2015-01-30 ENCOUNTER — Other Ambulatory Visit (HOSPITAL_BASED_OUTPATIENT_CLINIC_OR_DEPARTMENT_OTHER): Payer: Commercial Managed Care - HMO

## 2015-01-30 ENCOUNTER — Other Ambulatory Visit: Payer: Self-pay | Admitting: *Deleted

## 2015-01-30 ENCOUNTER — Telehealth: Payer: Self-pay | Admitting: Hematology and Oncology

## 2015-01-30 ENCOUNTER — Ambulatory Visit: Payer: Commercial Managed Care - HMO

## 2015-01-30 ENCOUNTER — Ambulatory Visit (HOSPITAL_BASED_OUTPATIENT_CLINIC_OR_DEPARTMENT_OTHER): Payer: Commercial Managed Care - HMO

## 2015-01-30 ENCOUNTER — Ambulatory Visit (HOSPITAL_BASED_OUTPATIENT_CLINIC_OR_DEPARTMENT_OTHER): Payer: Commercial Managed Care - HMO | Admitting: Hematology and Oncology

## 2015-01-30 VITALS — BP 151/72 | HR 77 | Temp 97.8°F | Resp 21 | Ht 64.0 in | Wt 163.2 lb

## 2015-01-30 DIAGNOSIS — C3412 Malignant neoplasm of upper lobe, left bronchus or lung: Secondary | ICD-10-CM

## 2015-01-30 DIAGNOSIS — R600 Localized edema: Secondary | ICD-10-CM | POA: Diagnosis not present

## 2015-01-30 DIAGNOSIS — Z5111 Encounter for antineoplastic chemotherapy: Secondary | ICD-10-CM

## 2015-01-30 DIAGNOSIS — Z720 Tobacco use: Secondary | ICD-10-CM

## 2015-01-30 DIAGNOSIS — Z95828 Presence of other vascular implants and grafts: Secondary | ICD-10-CM

## 2015-01-30 DIAGNOSIS — D6481 Anemia due to antineoplastic chemotherapy: Secondary | ICD-10-CM

## 2015-01-30 DIAGNOSIS — F17200 Nicotine dependence, unspecified, uncomplicated: Secondary | ICD-10-CM

## 2015-01-30 DIAGNOSIS — Z23 Encounter for immunization: Secondary | ICD-10-CM

## 2015-01-30 DIAGNOSIS — T451X5A Adverse effect of antineoplastic and immunosuppressive drugs, initial encounter: Secondary | ICD-10-CM

## 2015-01-30 DIAGNOSIS — C7931 Secondary malignant neoplasm of brain: Secondary | ICD-10-CM

## 2015-01-30 DIAGNOSIS — C7949 Secondary malignant neoplasm of other parts of nervous system: Secondary | ICD-10-CM

## 2015-01-30 LAB — COMPREHENSIVE METABOLIC PANEL (CC13)
ALBUMIN: 2.9 g/dL — AB (ref 3.5–5.0)
ALK PHOS: 110 U/L (ref 40–150)
ALT: 9 U/L (ref 0–55)
AST: 13 U/L (ref 5–34)
Anion Gap: 8 mEq/L (ref 3–11)
BUN: 7.9 mg/dL (ref 7.0–26.0)
CHLORIDE: 109 meq/L (ref 98–109)
CO2: 27 meq/L (ref 22–29)
Calcium: 9.2 mg/dL (ref 8.4–10.4)
Creatinine: 1.2 mg/dL — ABNORMAL HIGH (ref 0.6–1.1)
EGFR: 57 mL/min/{1.73_m2} — AB (ref >90)
GLUCOSE: 92 mg/dL (ref 70–140)
POTASSIUM: 3.7 meq/L (ref 3.5–5.1)
SODIUM: 144 meq/L (ref 136–145)
Total Bilirubin: 0.33 mg/dL (ref 0.20–1.20)
Total Protein: 6.8 g/dL (ref 6.4–8.3)

## 2015-01-30 LAB — CBC WITH DIFFERENTIAL/PLATELET
BASO%: 0.9 % (ref 0.0–2.0)
BASOS ABS: 0 10*3/uL (ref 0.0–0.1)
EOS ABS: 0 10*3/uL (ref 0.0–0.5)
EOS%: 1.1 % (ref 0.0–7.0)
HCT: 33.6 % — ABNORMAL LOW (ref 34.8–46.6)
HGB: 10.7 g/dL — ABNORMAL LOW (ref 11.6–15.9)
LYMPH%: 15.7 % (ref 14.0–49.7)
MCH: 26.9 pg (ref 25.1–34.0)
MCHC: 31.7 g/dL (ref 31.5–36.0)
MCV: 84.8 fL (ref 79.5–101.0)
MONO#: 0.5 10*3/uL (ref 0.1–0.9)
MONO%: 13.1 % (ref 0.0–14.0)
NEUT#: 2.7 10*3/uL (ref 1.5–6.5)
NEUT%: 69.2 % (ref 38.4–76.8)
Platelets: 296 10*3/uL (ref 145–400)
RBC: 3.96 10*6/uL (ref 3.70–5.45)
RDW: 19.2 % — ABNORMAL HIGH (ref 11.2–14.5)
WBC: 3.9 10*3/uL (ref 3.9–10.3)
lymph#: 0.6 10*3/uL — ABNORMAL LOW (ref 0.9–3.3)

## 2015-01-30 MED ORDER — SODIUM CHLORIDE 0.9 % IV SOLN
Freq: Once | INTRAVENOUS | Status: AC
  Administered 2015-01-30: 11:00:00 via INTRAVENOUS

## 2015-01-30 MED ORDER — SODIUM CHLORIDE 0.9 % IJ SOLN
10.0000 mL | INTRAMUSCULAR | Status: DC | PRN
  Administered 2015-01-30: 10 mL via INTRAVENOUS
  Filled 2015-01-30: qty 10

## 2015-01-30 MED ORDER — HEPARIN SOD (PORK) LOCK FLUSH 100 UNIT/ML IV SOLN
500.0000 [IU] | Freq: Once | INTRAVENOUS | Status: AC | PRN
  Administered 2015-01-30: 500 [IU]
  Filled 2015-01-30: qty 5

## 2015-01-30 MED ORDER — SODIUM CHLORIDE 0.9 % IV SOLN
480.0000 mg/m2 | Freq: Once | INTRAVENOUS | Status: AC
  Administered 2015-01-30: 900 mg via INTRAVENOUS
  Filled 2015-01-30: qty 36

## 2015-01-30 MED ORDER — SODIUM CHLORIDE 0.9 % IJ SOLN
10.0000 mL | INTRAMUSCULAR | Status: DC | PRN
  Administered 2015-01-30: 10 mL
  Filled 2015-01-30: qty 10

## 2015-01-30 MED ORDER — SODIUM CHLORIDE 0.9 % IV SOLN
Freq: Once | INTRAVENOUS | Status: AC
  Administered 2015-01-30: 11:00:00 via INTRAVENOUS
  Filled 2015-01-30: qty 4

## 2015-01-30 MED ORDER — INFLUENZA VAC SPLIT QUAD 0.5 ML IM SUSY
0.5000 mL | PREFILLED_SYRINGE | Freq: Once | INTRAMUSCULAR | Status: AC
  Administered 2015-01-30: 0.5 mL via INTRAMUSCULAR
  Filled 2015-01-30: qty 0.5

## 2015-01-30 NOTE — Patient Instructions (Signed)

## 2015-01-30 NOTE — Assessment & Plan Note (Signed)
Recent MRI is negative for disease recurrence. She is not symptomatic. Her next MRI is due in September 2016.

## 2015-01-30 NOTE — Progress Notes (Signed)
[facility] OFFICE PROGRESS NOTE  Patient Care Team: [name], MD as PCP - General (Family Medicine) Gaye Pollack, MD (Cardiothoracic Surgery) Thea Silversmith, MD (Radiation Oncology) Dickie La, MD (Family Medicine) Heath Lark, MD as Consulting Physician (Hematology and Oncology) Chrystal Katha Hamming, LCSW as Social Worker Renella Cunas, MD as Consulting Physician (Cardiology) Greg Cutter, LCSW as [place] Management (Licensed Clinical Social Worker) Standley Brooking, RN as [place] Management  SUMMARY OF ONCOLOGIC HISTORY: Oncology History   Colon cancer   Primary site: Colon and Rectum (Left)   Staging method: AJCC 7th Edition   Clinical: Stage I (T2, N0, M0) signed by Heath Lark, MD on 05/31/2013  2:39 PM   Pathologic: Stage I (T2, N0, cM0) signed by Heath Lark, MD on 05/31/2013  2:39 PM   Summary: Stage I (T2, N0, cM0) Lung cancer, EGFR/ALK negative, recurrence after initial resection to LN and brain   Primary site: Lung (Left)   Staging method: AJCC 7th Edition   Clinical: Stage IV (T1, N2, M1b) signed by Heath Lark, MD on 05/31/2013  2:26 PM   Pathologic: Stage IV (T1, N2, M1b) signed by Heath Lark, MD on 05/31/2013  2:26 PM   Summary: Stage IV (T1, N2, M1b)       Cancer of upper lobe of left lung, Adenocarcinoma   03/01/2007 Procedure Colonoscopy revealed abnormalities and biopsy show high-grade dysplasia   04/01/2007 Surgery She underwent sigmoid resection which showed T2 N0 colon cancer, and negative margins and all of 17 lymph nodes were negative   02/29/2008 Procedure Repeat surveillance colonoscopy was negative.   06/16/2010 Surgery She underwent left upper lobectomy we show well-differentiated adenocarcinoma of the lung, T1, N0, M0   03/18/2011 Procedure Repeat colonoscopy show multiple polyps but there were benign   10/19/2011 Procedure Biopsy of mediastinal lymph node came back positive for recurrence  of lung cancer, EGFR and ALK negative   11/16/2011 - 12/14/2011 Chemotherapy She received concurrent chemoradiation therapy with weekly carboplatin and Taxol.   11/16/2011 - 12/29/2011 Radiation Therapy She received radiation therapy with weekly chemotherapy   02/15/2012 Imaging MR of the brain showed a new intracranial metastases. This was subsequently treated with stereotactic radiosurgery.   03/07/2012 - 04/12/2013 Chemotherapy She received chemotherapy with maintainence Alimta every 3 weeks. Chemotherapy was discontinued due to profound fatigue   03/02/2013 Procedure She had therapeutic ultrasound guidance thoracentesis for pleural effusion that came back negative for cancer   05/04/2013 Procedure She had repeat ultrasound-guided thoracentesis again and cytology was negative   05/29/2013 Imaging Repeat CT scan of the chest, abdomen and pelvis show no evidence of disease but persistent right-sided pleural effusion   06/02/2013 Surgery The patient had placement of Pleurx catheter and subsequently underwent pleurodesis.   09/22/2013 Imaging Repeat CT scan show no evidence of active disease. There are nonspecific lymphadenopathy and she is placed on observation.   03/23/2014 Imaging Repeat CT scan of the chest, abdomen and pelvis show recurrence of cancer with widespread bilateral pulmonary metastasis.   05/14/2014 Imaging Imaging of the chest and brain were repeated due to delay of initiation of chemotherapy. Overall, chest CT scan show stable disease. MRI of the head was negative for recurrence   05/16/2014 - 07/18/2014 Chemotherapy  she completed 4 cycles of combination chemotherapy with carboplatin and Alimta   07/16/2014 Imaging  repeat CT scan of the chest, abdomen and pelvis show regression in the size of lung  nodules.   10/16/2014 Imaging  repeat CT scan of the chest, abdomen and pelvis show regression in the size of lung nodules.   01/29/2015 Imaging Repeat CT scan showed stable disease    Brain metastases  treated with radiosurgery   02/26/2012 Initial Diagnosis Brain metastases treated with radiosurgery    INTERVAL HISTORY: Please see below for problem oriented charting. She returns for further follow-up. She continues to feel fatigued. She continues to smoke and is not interested to quit smoking She have persistent bilateral lower extremity edema, unchanged Denies recent infection. REVIEW OF SYSTEMS:   Constitutional: Denies fevers, chills or abnormal weight loss Eyes: Denies blurriness of vision Ears, nose, mouth, throat, and face: Denies mucositis or sore throat Respiratory: Denies cough, dyspnea or wheezes Cardiovascular: Denies palpitation, chest discomfort or lower extremity swelling Gastrointestinal:  Denies nausea, heartburn or change in bowel habits Skin: Denies abnormal skin rashes Lymphatics: Denies new lymphadenopathy or easy bruising Neurological:Denies numbness, tingling or new weaknesses Behavioral/Psych: Mood is stable, no new changes  All other systems were reviewed with the patient and are negative.  I have reviewed the past medical history, past surgical history, social history and family history with the patient and they are unchanged from previous note.  ALLERGIES:  is allergic to adhesive and lisinopril.  MEDICATIONS:  Current Outpatient Prescriptions  Medication Sig Dispense Refill  . albuterol (PROVENTIL HFA;VENTOLIN HFA) 108 (90 BASE) MCG/ACT inhaler Inhale 1 puff into the lungs 2 (two) times daily. 6.7 g prn  . aspirin 81 MG chewable tablet Chew 81 mg by mouth daily.    . diclofenac sodium (VOLTAREN) 1 % GEL Apply 2 g topically 4 (four) times daily. As needed for pain 121 g 11  . folic acid (FOLVITE) 1 MG tablet Take 1 tablet (1 mg total) by mouth daily. 90 tablet 3  . furosemide (LASIX) 20 MG tablet Take 1 tablet (20 mg total) by mouth daily. 90 tablet 3  . lidocaine-prilocaine (EMLA) cream Apply topically as needed. Apply to porta cath site one hour  prior to needle stick. 30 g 6  . mirtazapine (REMERON) 15 MG tablet Take 1 tablet (15 mg total) by mouth at bedtime. 90 tablet 0  . nebivolol (BYSTOLIC) 10 MG tablet Take 1 tablet (10 mg total) by mouth daily. 90 tablet 4  . nitroGLYCERIN (NITROSTAT) 0.4 MG SL tablet Place 1 tablet (0.4 mg total) under the tongue every 5 (five) minutes as needed. For chest pain 25 tablet 5  . omeprazole (PRILOSEC) 40 MG capsule Take 1 capsule (40 mg total) by mouth daily. 90 capsule 3  . ondansetron (ZOFRAN) 8 MG tablet     . oxyCODONE (OXY IR/ROXICODONE) 5 MG immediate release tablet Take 1 tablet (5 mg total) by mouth every 6 (six) hours as needed for severe pain. 90 tablet 0  . PATADAY 0.2 % SOLN     . potassium chloride SA (K-DUR,KLOR-CON) 20 MEQ tablet Take 1 tablet (20 mEq total) by mouth 2 (two) times daily. 180 tablet 0  . pravastatin (PRAVACHOL) 40 MG tablet TAKE 1 TABLET BY MOUTH ONCE DAILY 30 tablet 3  . tiZANidine (ZANAFLEX) 2 MG tablet Take 1 tablet (2 mg total) by mouth every 6 (six) hours as needed (Back Muscle Spasm). 30 tablet 3  . traMADol (ULTRAM) 50 MG tablet Take 1 tablet (50 mg total) by mouth every 6 (six) hours as needed for moderate pain or severe pain. 90 tablet 1  . [DISCONTINUED] buPROPion (WELLBUTRIN XL) 150 MG  24 hr tablet Take 1 tablet (150 mg total) by mouth 2 (two) times daily. 60 tablet 5   No current facility-administered medications for this visit.    PHYSICAL EXAMINATION: ECOG PERFORMANCE STATUS: 1 - Symptomatic but completely ambulatory  Filed Vitals:   01/30/15 0956  BP: 151/72  Pulse: 77  Temp: 97.8 F (36.6 C)  Resp: 21   Filed Weights   01/30/15 0956  Weight: 163 lb 3.2 oz (74.027 kg)    GENERAL:alert, no distress and comfortable SKIN: skin color, texture, turgor are normal, no rashes or significant lesions EYES: normal, Conjunctiva are pink and non-injected, sclera clear OROPHARYNX:no exudate, no erythema and lips, buccal mucosa, and tongue normal  NECK:  supple, thyroid normal size, non-tender, without nodularity LYMPH:  no palpable lymphadenopathy in the cervical, axillary or inguinal LUNGS: clear to auscultation and percussion with normal breathing effort HEART: regular rate & rhythm and no murmurs with mild bilateral lower extremity edema ABDOMEN:abdomen soft, non-tender and normal bowel sounds Musculoskeletal:no cyanosis of digits and no clubbing  NEURO: alert & oriented x 3 with fluent speech, no focal motor/sensory deficits  LABORATORY DATA:  I have reviewed the data as listed    Component Value Date/Time   NA 143 01/09/2015 0908   NA 141 12/26/2013 0946   NA 143 09/28/2011 0833   K 3.6 01/09/2015 0908   K 4.0 12/26/2013 0946   K 3.5 09/28/2011 0833   CL 106 12/26/2013 0946   CL 102 10/24/2012 1001   CL 98 09/28/2011 0833   CO2 26 01/09/2015 0908   CO2 27 12/26/2013 0946   CO2 30 09/28/2011 0833   GLUCOSE 95 01/09/2015 0908   GLUCOSE 101* 12/26/2013 0946   GLUCOSE 111* 10/24/2012 1001   GLUCOSE 112 09/28/2011 0833   BUN 8.1 01/09/2015 0908   BUN 9 12/26/2013 0946   BUN 9 09/28/2011 0833   CREATININE 1.1 01/09/2015 0908   CREATININE 0.83 12/26/2013 0946   CREATININE 0.7 09/28/2011 0833   CALCIUM 9.0 01/09/2015 0908   CALCIUM 8.8 12/26/2013 0946   CALCIUM 8.7 09/28/2011 0833   PROT 6.6 01/09/2015 0908   PROT 6.8 12/26/2013 0946   PROT 7.2 09/28/2011 0833   ALBUMIN 2.8* 01/09/2015 0908   ALBUMIN 3.8 12/26/2013 0946   AST 13 01/09/2015 0908   AST 12 12/26/2013 0946   AST 15 09/28/2011 0833   ALT 11 01/09/2015 0908   ALT <8 12/26/2013 0946   ALT 16 09/28/2011 0833   ALKPHOS 114 01/09/2015 0908   ALKPHOS 148* 12/26/2013 0946   ALKPHOS 99* 09/28/2011 0833   BILITOT 0.33 01/09/2015 0908   BILITOT 0.6 12/26/2013 0946   BILITOT 0.80 09/28/2011 0833   GFRNONAA 74* 06/12/2013 0625   GFRAA 86* 06/12/2013 0625    No results found for: SPEP, UPEP  Lab Results  Component Value Date   WBC 3.9 01/30/2015   NEUTROABS  2.7 01/30/2015   HGB 10.7* 01/30/2015   HCT 33.6* 01/30/2015   MCV 84.8 01/30/2015   PLT 296 01/30/2015      Chemistry      Component Value Date/Time   NA 143 01/09/2015 0908   NA 141 12/26/2013 0946   NA 143 09/28/2011 0833   K 3.6 01/09/2015 0908   K 4.0 12/26/2013 0946   K 3.5 09/28/2011 0833   CL 106 12/26/2013 0946   CL 102 10/24/2012 1001   CL 98 09/28/2011 0833   CO2 26 01/09/2015 0908   CO2 27 12/26/2013 0946  CO2 30 09/28/2011 0833   BUN 8.1 01/09/2015 0908   BUN 9 12/26/2013 0946   BUN 9 09/28/2011 0833   CREATININE 1.1 01/09/2015 0908   CREATININE 0.83 12/26/2013 0946   CREATININE 0.7 09/28/2011 0833      Component Value Date/Time   CALCIUM 9.0 01/09/2015 0908   CALCIUM 8.8 12/26/2013 0946   CALCIUM 8.7 09/28/2011 0833   ALKPHOS 114 01/09/2015 0908   ALKPHOS 148* 12/26/2013 0946   ALKPHOS 99* 09/28/2011 0833   AST 13 01/09/2015 0908   AST 12 12/26/2013 0946   AST 15 09/28/2011 0833   ALT 11 01/09/2015 0908   ALT <8 12/26/2013 0946   ALT 16 09/28/2011 0833   BILITOT 0.33 01/09/2015 0908   BILITOT 0.6 12/26/2013 0946   BILITOT 0.80 09/28/2011 0833       RADIOGRAPHIC STUDIES: I have personally reviewed the radiological images as listed and agreed with the findings in the report. Ct Chest W Contrast  01/29/2015   CLINICAL DATA:  Restaging lung cancer. Current chemotherapy. History of colon cancer in 2008, lung cancer in 2012. Intracranial malignancy in 2013.  EXAM: CT CHEST, ABDOMEN, AND PELVIS WITH CONTRAST  TECHNIQUE: Multidetector CT imaging of the chest, abdomen and pelvis was performed following the standard protocol during bolus administration of intravenous contrast.  CONTRAST:  65mL OMNIPAQUE IOHEXOL 300 MG/ML SOLN, 193mL OMNIPAQUE IOHEXOL 300 MG/ML SOLN  COMPARISON:  Multiple exams, including 10/16/2014  FINDINGS: CT CHEST FINDINGS  Mediastinum/Nodes: Stable cystic left thyroid lesion. Stable mediastinal stranding with poor definition of tissue  planes along the right mediastinum, around the right central tracheobronchial tree, and in the right subcarinal region. The lymph node in the pericardial adipose tissue measures 8 mm in short axis on image 39 series 2, previously 9 mm.  Coronary, aortic arch, and branch vessel atherosclerotic vascular disease.  Lungs/Pleura: Scattered faint nodularity in the right lung with numerous 2-4 mm nodules observed, and faint reticulonodular interstitial accentuation. Stable peribronchovascular density on the right with paramediastinal fibrosis. Stable atelectasis or scarring inferiorly in the right middle lobe. Stable scarring in the lingula. No new or progressive nodule. Stable right posterior pleural calcification along the posterior costophrenic angle.  Musculoskeletal: Unremarkable  CT ABDOMEN PELVIS FINDINGS  Hepatobiliary: Several stable tiny hypodense liver lesions, likely benign given the lack of progression.  Pancreas: Unremarkable  Spleen: Unremarkable  Adrenals/Urinary Tract: Stable fullness of the left adrenal gland without a discrete nodule/ mass. Tiny left kidney upper pole hypodense lesion technically nonspecific although statistically likely to be a cyst and unchanged from previous.  Stomach/Bowel: Unremarkable  Vascular/Lymphatic: Aortoiliac atherosclerotic vascular disease. Ectatic infrarenal abdominal aorta.  Reproductive: Uterus absent.  Adnexa unremarkable.  Other: No supplemental non-categorized findings.  Musculoskeletal: Small left supraumbilical hernia contains adipose tissue and a slight margin of the transverse colon, without strangulation or obstruction. Spondylosis and degenerative disc disease at L5-S1.  IMPRESSION: 1. Stable appearance with faint nodularity scattered in the right lung but no progression or dominant lesion identified. Postradiation therapy findings in the right lung and mediastinum. 2. Atherosclerosis. 3. Small left supraumbilical hernia contains adipose tissue and a slight  margin of the transverse colon, without complicating feature.   Electronically Signed   By: Van Clines M.D.   On: 01/29/2015 11:33   Ct Abdomen Pelvis W Contrast  01/29/2015   CLINICAL DATA:  Restaging lung cancer. Current chemotherapy. History of colon cancer in 2008, lung cancer in 2012. Intracranial malignancy in 2013.  EXAM: CT CHEST, ABDOMEN, AND  PELVIS WITH CONTRAST  TECHNIQUE: Multidetector CT imaging of the chest, abdomen and pelvis was performed following the standard protocol during bolus administration of intravenous contrast.  CONTRAST:  12m OMNIPAQUE IOHEXOL 300 MG/ML SOLN, 1080mOMNIPAQUE IOHEXOL 300 MG/ML SOLN  COMPARISON:  Multiple exams, including 10/16/2014  FINDINGS: CT CHEST FINDINGS  Mediastinum/Nodes: Stable cystic left thyroid lesion. Stable mediastinal stranding with poor definition of tissue planes along the right mediastinum, around the right central tracheobronchial tree, and in the right subcarinal region. The lymph node in the pericardial adipose tissue measures 8 mm in short axis on image 39 series 2, previously 9 mm.  Coronary, aortic arch, and branch vessel atherosclerotic vascular disease.  Lungs/Pleura: Scattered faint nodularity in the right lung with numerous 2-4 mm nodules observed, and faint reticulonodular interstitial accentuation. Stable peribronchovascular density on the right with paramediastinal fibrosis. Stable atelectasis or scarring inferiorly in the right middle lobe. Stable scarring in the lingula. No new or progressive nodule. Stable right posterior pleural calcification along the posterior costophrenic angle.  Musculoskeletal: Unremarkable  CT ABDOMEN PELVIS FINDINGS  Hepatobiliary: Several stable tiny hypodense liver lesions, likely benign given the lack of progression.  Pancreas: Unremarkable  Spleen: Unremarkable  Adrenals/Urinary Tract: Stable fullness of the left adrenal gland without a discrete nodule/ mass. Tiny left kidney upper pole hypodense  lesion technically nonspecific although statistically likely to be a cyst and unchanged from previous.  Stomach/Bowel: Unremarkable  Vascular/Lymphatic: Aortoiliac atherosclerotic vascular disease. Ectatic infrarenal abdominal aorta.  Reproductive: Uterus absent.  Adnexa unremarkable.  Other: No supplemental non-categorized findings.  Musculoskeletal: Small left supraumbilical hernia contains adipose tissue and a slight margin of the transverse colon, without strangulation or obstruction. Spondylosis and degenerative disc disease at L5-S1.  IMPRESSION: 1. Stable appearance with faint nodularity scattered in the right lung but no progression or dominant lesion identified. Postradiation therapy findings in the right lung and mediastinum. 2. Atherosclerosis. 3. Small left supraumbilical hernia contains adipose tissue and a slight margin of the transverse colon, without complicating feature.   Electronically Signed   By: WaVan Clines.D.   On: 01/29/2015 11:33     ASSESSMENT & PLAN:  Cancer of upper lobe of left lung, Adenocarcinoma  She tolerated treatment well.  We will continue on treatment every 3 weeks indefinitely. Recent CT scan in September 2016 continue to show positive response to treatment. She complained of fatigue likely related to treatment but she wants to continue treatment for now. I will continue the same treatment without dose adjustment.   Anemia due to chemotherapy Anemia is multi-factorial, could be related to anemia of chronic disease & chemotherapy side effects. Iron study in May 2016 is adequate She is not symptomatic. She will continue vitamin B-12 injection every few months and folic acid supplementation.   TOBACCO ABUSE I spent some time counseling the patient the importance of tobacco cessation. She is currently not interested to quit now.     Bilateral leg edema She has bilateral lower extremity edema, left greater than the right. This is likely related to  low albumin with poor circulation. She had colon cancer and some of the lymph nodes were removed. She will continue intermittently doses of Lasix as tolerated.     Brain metastases treated with radiosurgery Recent MRI is negative for disease recurrence. She is not symptomatic. Her next MRI is due in September 2016.      No orders of the defined types were placed in this encounter.   All questions were answered. The  patient knows to call the clinic with any problems, questions or concerns. No barriers to learning was detected. I spent 25 minutes counseling the patient face to face. The total time spent in the appointment was 40 minutes and more than 50% was on counseling and review of test results     [facility], Lakeasha Petion, MD 01/30/2015 10:22 AM

## 2015-01-30 NOTE — Assessment & Plan Note (Signed)
Anemia is multi-factorial, could be related to anemia of chronic disease & chemotherapy side effects. Iron study in May 2016 is adequate She is not symptomatic. She will continue vitamin B-12 injection every few months and folic acid supplementation.

## 2015-01-30 NOTE — Patient Instructions (Signed)
[facility] Discharge Instructions for Patients Receiving Chemotherapy  Today you received the following chemotherapy agents; Alimta.   To help prevent nausea and vomiting after your treatment, we encourage you to take your nausea medication as directed.    If you develop nausea and vomiting that is not controlled by your nausea medication, call the clinic.   BELOW ARE SYMPTOMS THAT SHOULD BE REPORTED IMMEDIATELY:  *FEVER GREATER THAN 100.5 F  *CHILLS WITH OR WITHOUT FEVER  NAUSEA AND VOMITING THAT IS NOT CONTROLLED WITH YOUR NAUSEA MEDICATION  *UNUSUAL SHORTNESS OF BREATH  *UNUSUAL BRUISING OR BLEEDING  TENDERNESS IN MOUTH AND THROAT WITH OR WITHOUT PRESENCE OF ULCERS  *URINARY PROBLEMS  *BOWEL PROBLEMS  UNUSUAL RASH Items with * indicate a potential emergency and should be followed up as soon as possible.  Feel free to call the clinic you have any questions or concerns. The clinic phone number is [phone].  Please show the CHEMO ALERT CARD at check-in to the Emergency Department and triage nurse.   

## 2015-01-30 NOTE — Telephone Encounter (Signed)
Gave and printed appt sched and avs for pt for Sept and OCT °

## 2015-01-30 NOTE — Assessment & Plan Note (Signed)
She has bilateral lower extremity edema, left greater than the right. This is likely related to low albumin with poor circulation. She had colon cancer and some of the lymph nodes were removed. She will continue intermittently doses of Lasix as tolerated.

## 2015-01-30 NOTE — Assessment & Plan Note (Signed)
She tolerated treatment well.  We will continue on treatment every 3 weeks indefinitely. Recent CT scan in September 2016 continue to show positive response to treatment. She complained of fatigue likely related to treatment but she wants to continue treatment for now. I will continue the same treatment without dose adjustment.

## 2015-01-30 NOTE — Assessment & Plan Note (Signed)
I spent some time counseling the patient the importance of tobacco cessation. She is currently not interested to quit now. 

## 2015-01-30 NOTE — Patient Outreach (Addendum)
[facility]) Care Management  01/30/2015  Lauren [name] 1951/04/22 [id number]   Phone call to patient to follow up on continued social work needs.  Per patient, she now receives $16.00 in food stamps. Food pantry options previously provided to patient. Per  patient  she went to the dollar store and found some sleeves and have been wearing those to help decrease her swelling.  She has declined to go through her insurance company to order the compression supplies, stating that she will continue to use the sleeves purchased at the dollar store. Patient has no further social work needs at this time..  Case to be closed to social work at this time.   [name] [facility] Care Management [phone]

## 2015-02-01 ENCOUNTER — Ambulatory Visit
Admission: RE | Admit: 2015-02-01 | Discharge: 2015-02-01 | Disposition: A | Discharge status: Home / Self Care | Payer: Commercial Managed Care - HMO | Source: Ambulatory Visit | Attending: Radiation Oncology | Admitting: Radiation Oncology

## 2015-02-01 ENCOUNTER — Other Ambulatory Visit: Payer: Self-pay

## 2015-02-01 ENCOUNTER — Ambulatory Visit: Payer: Commercial Managed Care - HMO

## 2015-02-01 ENCOUNTER — Telehealth: Payer: Self-pay | Admitting: Hematology and Oncology

## 2015-02-01 ENCOUNTER — Ambulatory Visit (HOSPITAL_BASED_OUTPATIENT_CLINIC_OR_DEPARTMENT_OTHER): Payer: Commercial Managed Care - HMO

## 2015-02-01 DIAGNOSIS — Z85118 Personal history of other malignant neoplasm of bronchus and lung: Secondary | ICD-10-CM | POA: Diagnosis not present

## 2015-02-01 DIAGNOSIS — Z95828 Presence of other vascular implants and grafts: Secondary | ICD-10-CM

## 2015-02-01 DIAGNOSIS — C3412 Malignant neoplasm of upper lobe, left bronchus or lung: Secondary | ICD-10-CM | POA: Diagnosis not present

## 2015-02-01 DIAGNOSIS — C7931 Secondary malignant neoplasm of brain: Secondary | ICD-10-CM

## 2015-02-01 MED ORDER — GADOBENATE DIMEGLUMINE 529 MG/ML IV SOLN
15.0000 mL | Freq: Once | INTRAVENOUS | Status: AC | PRN
  Administered 2015-02-01: 15 mL via INTRAVENOUS

## 2015-02-01 MED ORDER — HEPARIN SOD (PORK) LOCK FLUSH 100 UNIT/ML IV SOLN
500.0000 [IU] | Freq: Once | INTRAVENOUS | Status: AC
  Administered 2015-02-01: 500 [IU] via INTRAVENOUS
  Filled 2015-02-01: qty 5

## 2015-02-01 MED ORDER — SODIUM CHLORIDE 0.9 % IJ SOLN
10.0000 mL | INTRAMUSCULAR | Status: DC | PRN
  Administered 2015-02-01: 10 mL via INTRAVENOUS
  Filled 2015-02-01: qty 10

## 2015-02-01 NOTE — Telephone Encounter (Signed)
pt need a flush add on -added

## 2015-02-01 NOTE — Patient Instructions (Signed)

## 2015-02-01 NOTE — Progress Notes (Signed)
Pt reported to flush to have port de-accessed. MRI was scheduled in the wrong location. Pt will be unable to make it today. Sent pt to see Dr. Isidore Moos in radiology to see about rescheduling MRI.  Blood return noted. Pt De-accessed.

## 2015-02-06 ENCOUNTER — Ambulatory Visit: Payer: Self-pay | Admitting: Radiation Oncology

## 2015-02-08 ENCOUNTER — Encounter: Payer: Self-pay | Admitting: Radiation Oncology

## 2015-02-08 ENCOUNTER — Ambulatory Visit
Admission: RE | Admit: 2015-02-08 | Discharge: 2015-02-08 | Disposition: A | Discharge status: Home / Self Care | Payer: Commercial Managed Care - HMO | Source: Ambulatory Visit | Attending: Radiation Oncology | Admitting: Radiation Oncology

## 2015-02-08 VITALS — BP 122/65 | HR 79 | Temp 98.1°F | Ht 64.0 in | Wt 164.9 lb

## 2015-02-08 DIAGNOSIS — C7949 Secondary malignant neoplasm of other parts of nervous system: Principal | ICD-10-CM

## 2015-02-08 DIAGNOSIS — C7931 Secondary malignant neoplasm of brain: Secondary | ICD-10-CM

## 2015-02-08 NOTE — Progress Notes (Signed)
Radiation Oncology         [phone] ________________________________  Name: Lauren [name] MRN: [id number]  Date: 02/08/2015  DOB: 1951-02-04  Follow-Up Visit Note  Outpatient  CC: MCDIARMID,TODD D, MD  Cletus Gash, MD  Diagnosis:   Diagnosis and Prior Radiotherapy: Metastatic non-small cell lung cancer, adenocarcinoma, with 2 brain metastases. Left occipital and right insular metastases  Interval Since Last Radiation: She completed 20 Gray in 1 fraction to both of the metastases on 03/01/2012      ICD-9-CM ICD-10-CM   1. Brain metastases treated with radiosurgery 198.3 C79.31      Narrative: The patient presenting for her routine follow-up appointment with radiation oncology. She reports to having dull temporal headaches. She denies ataxia or fine motor movement difficulty.  Per medical oncology she was prescribed Alimta to alleviate vocalized symptoms. Her MRI was reviewed at [facility] tumor board which shows no evidence of new or progressive disease. She reports knee pain, ankle pain, some back pain, swelling of the lower left leg/foot, and slight swelling of the Left   ankle foot as well as some on the right. "It burns at night time, when I lay down." No calf pain. Her PCP recommended a compressor sleeve, but she was not interested in this option. The patient projected a health mental status and was not accompanied by family for today's radiation oncology visit.     ALLERGIES:  is allergic to adhesive and lisinopril.  Meds: Current Outpatient Prescriptions  Medication Sig Dispense Refill  . albuterol (PROVENTIL HFA;VENTOLIN HFA) 108 (90 BASE) MCG/ACT inhaler Inhale 1 puff into the lungs 2 (two) times daily. 6.7 g prn  . aspirin 81 MG chewable tablet Chew 81 mg by mouth daily.    . diclofenac sodium (VOLTAREN) 1 % GEL Apply 2 g topically 4 (four) times daily. As needed for pain 300 g 11  . folic acid (FOLVITE) 1 MG tablet Take 1 tablet (1 mg total) by mouth daily. 90  tablet 3  . furosemide (LASIX) 20 MG tablet Take 1 tablet (20 mg total) by mouth daily. 90 tablet 3  . lidocaine-prilocaine (EMLA) cream Apply topically as needed. Apply to porta cath site one hour prior to needle stick. 30 g 6  . mirtazapine (REMERON) 15 MG tablet Take 1 tablet (15 mg total) by mouth at bedtime. 90 tablet 0  . nebivolol (BYSTOLIC) 10 MG tablet Take 1 tablet (10 mg total) by mouth daily. 90 tablet 4  . nitroGLYCERIN (NITROSTAT) 0.4 MG SL tablet Place 1 tablet (0.4 mg total) under the tongue every 5 (five) minutes as needed. For chest pain 25 tablet 5  . omeprazole (PRILOSEC) 40 MG capsule Take 1 capsule (40 mg total) by mouth daily. 90 capsule 3  . PATADAY 0.2 % SOLN     . potassium chloride SA (K-DUR,KLOR-CON) 20 MEQ tablet Take 1 tablet (20 mEq total) by mouth 2 (two) times daily. 180 tablet 0  . pravastatin (PRAVACHOL) 40 MG tablet TAKE 1 TABLET BY MOUTH ONCE DAILY 30 tablet 3  . tiZANidine (ZANAFLEX) 2 MG tablet Take 1 tablet (2 mg total) by mouth every 6 (six) hours as needed (Back Muscle Spasm). 30 tablet 3  . traMADol (ULTRAM) 50 MG tablet Take 1 tablet (50 mg total) by mouth every 6 (six) hours as needed for moderate pain or severe pain. 90 tablet 1  . [DISCONTINUED] buPROPion (WELLBUTRIN XL) 150 MG 24 hr tablet Take 1 tablet (150 mg total) by mouth 2 (two)  times daily. 60 tablet 5   No current facility-administered medications for this encounter.    Physical Findings: The patient is in no acute distress. Patient is alert and oriented. There is no significant changes to the status of the paients overall health to be noted at this time.  height is '5\' 4"'$  (1.626 m) and weight is 164 lb 14.4 oz (74.798 kg). Her temperature is 98.1 F (36.7 C). Her blood pressure is 122/65 and her pulse is 79. Her oxygen saturation is 100%. .   Oral cavity without thrush, no oral pharyngeal lesions Lungs are clear to auscultation bilaterally Heart has regular rate and rhythm Strength is  intact and symmetric throughout Finger to nose testing and rapidly alternating movements intact Cranial nerves are intact throughout   Lab Findings: Lab Results  Component Value Date   WBC 3.9 01/30/2015   HGB 10.7* 01/30/2015   HCT 33.6* 01/30/2015   MCV 84.8 01/30/2015   PLT 296 01/30/2015      CBC    Component Value Date/Time   WBC 3.9 01/30/2015 0913   WBC 6.5 06/12/2013 0625   RBC 3.96 01/30/2015 0913   RBC 5.16* 06/12/2013 0625   HGB 10.7* 01/30/2015 0913   HGB 12.9 06/12/2013 0625   HCT 33.6* 01/30/2015 0913   HCT 40.8 06/12/2013 0625   PLT 296 01/30/2015 0913   PLT 202 06/12/2013 0625   MCV 84.8 01/30/2015 0913   MCV 79.1 06/12/2013 0625   MCH 26.9 01/30/2015 0913   MCH 25.0* 06/12/2013 0625   MCHC 31.7 01/30/2015 0913   MCHC 31.6 06/12/2013 0625   RDW 19.2* 01/30/2015 0913   RDW 17.3* 06/12/2013 0625   LYMPHSABS 0.6* 01/30/2015 0913   LYMPHSABS 0.8 03/02/2013 0050   MONOABS 0.5 01/30/2015 0913   MONOABS 0.1 03/02/2013 0050   EOSABS 0.0 01/30/2015 0913   EOSABS 0.1 03/02/2013 0050   BASOSABS 0.0 01/30/2015 0913   BASOSABS 0.0 03/02/2013 0050      Radiographic Findings: See above  Impression/Plan: Lauren [name] is a 64 year old female patient presenting to clinic in regards to her cancer of the brain. She is with no evidence of recurrence of disease in the brain. The patient understands the results of her most recent MRI, which were reviewed in detail. Her next MRI will take place in 6 months time. Healthy methods of management to address the patient's vocalized symptoms have been reviewed. The patient is appropriately managing her current symptoms. She advised to continue the administration of the previously provided medication Alimta. She is being referred to physical therapy to address her lower leg and foot swelling and pain. To address her reported blurry vision/eye discharge, she has been instructed to follow-up with her eye doctor to alleviate her  vocalized symptoms. The patient is aware of her radiation oncology follow-up appointment to take place as scheduled, in six months time. The patient understands that she can access her appointments and medical records via [place]. All vocalized questions and concerns have been addressed. If the patient develops any further questions or concerns in regards to her treatment and recovery, she has been encouraged to contact Dr. Isidore Moos, MD.   This document serves as a record of services personally performed by Eppie Gibson, MD. It was created on her behalf by Lenn Cal, a trained medical scribe. The creation of this record is based on the scribe's personal observations and the provider's statements to them. This document has been checked and approved by the attending provider.  _________________   Eppie Gibson, MD

## 2015-02-08 NOTE — Progress Notes (Addendum)
Lauren [name] here for reassessmet s/p xrt.  She admits to having dull temporal headaches, but denies any ataxia and any issues with fine motor movement.   Note edema of bilateral feet.  Receiving Alimta per Medical Oncology

## 2015-02-11 ENCOUNTER — Other Ambulatory Visit: Payer: Self-pay | Admitting: Obstetrics and Gynecology

## 2015-02-11 ENCOUNTER — Telehealth: Payer: Self-pay | Admitting: *Deleted

## 2015-02-11 NOTE — Telephone Encounter (Signed)
CALLED PATIENT TO INFORM OF PT APPT. FOR 02-14-15- ARRIVAL TIME - 10:45 AM @ [facility], SPOKE WITH PATIENT AND SHE IS AWARE OF THIS APPT.

## 2015-02-14 ENCOUNTER — Ambulatory Visit: Payer: Commercial Managed Care - HMO | Admitting: Physical Therapy

## 2015-02-19 ENCOUNTER — Encounter: Payer: Self-pay | Admitting: Pharmacist

## 2015-02-19 ENCOUNTER — Ambulatory Visit: Payer: Commercial Managed Care - HMO | Admitting: Physical Therapy

## 2015-02-20 ENCOUNTER — Ambulatory Visit (HOSPITAL_BASED_OUTPATIENT_CLINIC_OR_DEPARTMENT_OTHER): Payer: Commercial Managed Care - HMO

## 2015-02-20 ENCOUNTER — Other Ambulatory Visit (HOSPITAL_BASED_OUTPATIENT_CLINIC_OR_DEPARTMENT_OTHER): Payer: Commercial Managed Care - HMO

## 2015-02-20 ENCOUNTER — Ambulatory Visit: Payer: Commercial Managed Care - HMO

## 2015-02-20 VITALS — BP 157/70 | HR 71 | Temp 97.8°F | Resp 16

## 2015-02-20 DIAGNOSIS — Z95828 Presence of other vascular implants and grafts: Secondary | ICD-10-CM

## 2015-02-20 DIAGNOSIS — C3412 Malignant neoplasm of upper lobe, left bronchus or lung: Secondary | ICD-10-CM

## 2015-02-20 DIAGNOSIS — Z5111 Encounter for antineoplastic chemotherapy: Secondary | ICD-10-CM

## 2015-02-20 LAB — CBC WITH DIFFERENTIAL/PLATELET
BASO%: 0.5 % (ref 0.0–2.0)
BASOS ABS: 0 10*3/uL (ref 0.0–0.1)
EOS ABS: 0.1 10*3/uL (ref 0.0–0.5)
EOS%: 1.1 % (ref 0.0–7.0)
HEMATOCRIT: 34 % — AB (ref 34.8–46.6)
HEMOGLOBIN: 10.5 g/dL — AB (ref 11.6–15.9)
LYMPH#: 0.9 10*3/uL (ref 0.9–3.3)
LYMPH%: 20 % (ref 14.0–49.7)
MCH: 27.1 pg (ref 25.1–34.0)
MCHC: 30.9 g/dL — ABNORMAL LOW (ref 31.5–36.0)
MCV: 87.9 fL (ref 79.5–101.0)
MONO#: 0.7 10*3/uL (ref 0.1–0.9)
MONO%: 15.1 % — AB (ref 0.0–14.0)
NEUT%: 63.3 % (ref 38.4–76.8)
NEUTROS ABS: 2.8 10*3/uL (ref 1.5–6.5)
Platelets: 346 10*3/uL (ref 145–400)
RBC: 3.87 10*6/uL (ref 3.70–5.45)
RDW: 18.2 % — AB (ref 11.2–14.5)
WBC: 4.4 10*3/uL (ref 3.9–10.3)

## 2015-02-20 LAB — COMPREHENSIVE METABOLIC PANEL (CC13)
ALBUMIN: 2.9 g/dL — AB (ref 3.5–5.0)
ALK PHOS: 101 U/L (ref 40–150)
ALT: 10 U/L (ref 0–55)
AST: 14 U/L (ref 5–34)
Anion Gap: 6 mEq/L (ref 3–11)
BILIRUBIN TOTAL: 0.3 mg/dL (ref 0.20–1.20)
BUN: 10.1 mg/dL (ref 7.0–26.0)
CALCIUM: 8.8 mg/dL (ref 8.4–10.4)
CO2: 26 mEq/L (ref 22–29)
Chloride: 112 mEq/L — ABNORMAL HIGH (ref 98–109)
Creatinine: 1 mg/dL (ref 0.6–1.1)
EGFR: 65 mL/min/{1.73_m2} — AB (ref >90)
GLUCOSE: 94 mg/dL (ref 70–140)
POTASSIUM: 3.6 meq/L (ref 3.5–5.1)
SODIUM: 144 meq/L (ref 136–145)
TOTAL PROTEIN: 6.7 g/dL (ref 6.4–8.3)

## 2015-02-20 MED ORDER — HEPARIN SOD (PORK) LOCK FLUSH 100 UNIT/ML IV SOLN
500.0000 [IU] | Freq: Once | INTRAVENOUS | Status: AC | PRN
  Administered 2015-02-20: 500 [IU]
  Filled 2015-02-20: qty 5

## 2015-02-20 MED ORDER — SODIUM CHLORIDE 0.9 % IJ SOLN
10.0000 mL | INTRAMUSCULAR | Status: DC | PRN
  Administered 2015-02-20: 10 mL
  Filled 2015-02-20: qty 10

## 2015-02-20 MED ORDER — SODIUM CHLORIDE 0.9 % IJ SOLN
10.0000 mL | INTRAMUSCULAR | Status: DC | PRN
  Administered 2015-02-20: 10 mL via INTRAVENOUS
  Filled 2015-02-20: qty 10

## 2015-02-20 MED ORDER — CYANOCOBALAMIN 1000 MCG/ML IJ SOLN
1000.0000 ug | Freq: Once | INTRAMUSCULAR | Status: AC
  Administered 2015-02-20: 1000 ug via INTRAMUSCULAR

## 2015-02-20 MED ORDER — SODIUM CHLORIDE 0.9 % IV SOLN
Freq: Once | INTRAVENOUS | Status: AC
  Administered 2015-02-20: 12:00:00 via INTRAVENOUS

## 2015-02-20 MED ORDER — CYANOCOBALAMIN 1000 MCG/ML IJ SOLN
INTRAMUSCULAR | Status: AC
  Filled 2015-02-20: qty 1

## 2015-02-20 MED ORDER — SODIUM CHLORIDE 0.9 % IV SOLN
Freq: Once | INTRAVENOUS | Status: AC
  Administered 2015-02-20: 12:00:00 via INTRAVENOUS
  Filled 2015-02-20: qty 4

## 2015-02-20 MED ORDER — SODIUM CHLORIDE 0.9 % IV SOLN
485.0000 mg/m2 | Freq: Once | INTRAVENOUS | Status: AC
  Administered 2015-02-20: 900 mg via INTRAVENOUS
  Filled 2015-02-20: qty 36

## 2015-02-20 NOTE — Patient Instructions (Signed)
[place] Discharge Instructions for Patients Receiving Chemotherapy  Today you received the following chemotherapy agents alimta  To help prevent nausea and vomiting after your treatment, we encourage you to take your nausea medication   If you develop nausea and vomiting that is not controlled by your nausea medication, call the clinic.   BELOW ARE SYMPTOMS THAT SHOULD BE REPORTED IMMEDIATELY:  *FEVER GREATER THAN 100.5 F  *CHILLS WITH OR WITHOUT FEVER  NAUSEA AND VOMITING THAT IS NOT CONTROLLED WITH YOUR NAUSEA MEDICATION  *UNUSUAL SHORTNESS OF BREATH  *UNUSUAL BRUISING OR BLEEDING  TENDERNESS IN MOUTH AND THROAT WITH OR WITHOUT PRESENCE OF ULCERS  *URINARY PROBLEMS  *BOWEL PROBLEMS  UNUSUAL RASH Items with * indicate a potential emergency and should be followed up as soon as possible.  Feel free to call the clinic you have any questions or concerns. The clinic phone number is [phone].  Please show the [place] at check-in to the Emergency Department and triage nurse.

## 2015-02-20 NOTE — Patient Instructions (Signed)

## 2015-02-25 ENCOUNTER — Ambulatory Visit: Payer: Self-pay

## 2015-02-26 ENCOUNTER — Ambulatory Visit: Payer: Commercial Managed Care - HMO | Attending: Radiation Oncology | Admitting: Physical Therapy

## 2015-02-26 ENCOUNTER — Other Ambulatory Visit: Payer: Self-pay

## 2015-02-26 ENCOUNTER — Telehealth: Payer: Self-pay | Admitting: *Deleted

## 2015-02-26 DIAGNOSIS — R600 Localized edema: Secondary | ICD-10-CM | POA: Diagnosis not present

## 2015-02-26 NOTE — Therapy (Signed)
[place], [place], [address] Phone: [phone]   Fax:  [phone]  Physical Therapy Evaluation  Patient Details  Name: Lauren [name] MRN: [id number] Date of Birth: 10/31/1950 Referring Provider:  Eppie Gibson, MD  Encounter Date: 02/26/2015      PT End of Session - 02/26/15 1516    Visit Number 1   Number of Visits 1   PT Start Time 1420   PT Stop Time 1510   PT Time Calculation (min) 50 min   Activity Tolerance Patient tolerated treatment well   Behavior During Therapy [facility] for tasks assessed/performed      Past Medical History  Diagnosis Date  . Depression   . Hyperlipidemia   . Hypertension   . Lung nodule     FNA ordered for 04/02/10 by HA>pos Ca  . Chronic folliculitis     of groin  . Fibrocystic breast changes   . Family history of trichomonal vaginitis 05/2005  . Postmenopausal   . Depressive disorder   . Hypercholesterolemia   . GERD (gastroesophageal reflux disease)   . Cerebral aneurysm   . Back pain   . Shortness of breath   . COPD (chronic obstructive pulmonary disease)   . Coronary artery disease   . Arthritis   . Weight loss 10/22/2011  . Status post radiation therapy 11/16/11 - 12/29/11    Right Lung and Mediastinum: 60 Gy  . Status post chemotherapy comp. 12/29/11    Carboplatin/Taxol  . S/P radiation therapy 03/01/12    SRS: 1 fraction / 20 Gray each to the Left Occipital Region and to the Right Insular Metastases  . On antineoplastic chemotherapy started 02/2012    Alimta  . Fatigue 02/06/2013  . Pleural effusion 05/03/2013  . Blurred vision, bilateral 06/06/2014  . Colon cancer 11/08  . Lung cancer 06/16/10    PET scan 04/28/2010; primary: increase in size 02/2010 / Well Differentiated Adenocarcinoma of the lung   . Brain metastases 02/15/12  . Other fatigue 11/28/2014  . Bilateral pleural effusion 08/09/2013  . POSTMENOPAUSAL SYNDROME 01/31/2009    Qualifier:  Diagnosis of  By: Carlena Sax  MD, Colletta [place]    . Hypokalemia 08/09/2013  . 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 Bilateral leg edema 10/17/2014  . 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 Pleural effusion, malignant 05/2013    Recurrent Pleural Effusion  . Status post stereotactic radiosurgery 01/2012    for Brain Metastases  . Tobacco dependence   . Family history of Huntington's disease     Past Surgical History  Procedure Laterality Date  . Colectomy  03/22/07    Stage 1 pT2 N0, M0 Adenocarcinoma of the sigmoid  colon  . Tubal ligation    . Lung lobectomy  06/16/10    Left Upper Lobectomy  . Hernia repair    . Breast surgery      Bil lumpectomy  . Back surgery      Dr Luiz Ochoa  . Mediastinoscopy  10/19/2011    Procedure: MEDIASTINOSCOPY;  Surgeon: Gaye Pollack, MD;  Location: [facility] OR;  Service: Thoracic;  Laterality: N/A;  . Cardiac cath x3    . Chest tube insertion Right 06/12/2013    Procedure: INSERTION PLEURAL DRAINAGE CATHETER;  Surgeon: Gaye Pollack, MD;  Location: [place];  Service: Thoracic;  Laterality: Right;  . [place]c pleurodesis Right 06/12/2013    Procedure: Pietro Cassis;  Surgeon: Gaye Pollack, MD;  Location: [place];  Service: Thoracic;  Laterality: Right;  . Tunneled venous catheter placement      Port-a-Cath    There were no vitals filed for this visit.  Visit Diagnosis:  Edema of left lower extremity - Plan: PT plan of care cert/re-cert      Subjective Assessment - 02/26/15 1422    Subjective I've been dealing with cancer since 2008"  She is having problems swallowing and doesn't feel that she is eating enough and she feels like she getting weaker.  She is hear to see about the swelling in her leg. She says its better now     Pertinent History colon with surgery with lymph nodes removed, lung cancer surgery to left lobe, COPD in right lung with 2 spots to brain.  She is undergoing chemotherapy, takes it every 3 weeks    Patient Stated Goals She wants to get a  compresssion stocking.    Currently in Pain? No/denies  she says she has had burning pain from left leg in the past when she elevated  it             [facility] PT Assessment - 02/26/15 0001    Assessment   Medical Diagnosis metastaic lung cancer  with history of colon cancer with surgery    Onset Date/Surgical Date 05/18/06   Precautions   Precautions Other (comment)   Precaution Comments cancer current chemotherapy    Restrictions   Weight Bearing Restrictions No   Balance Screen   Has the patient fallen in the past 6 months No   Has the patient had a decrease in activity level because of a fear of falling?  No   Is the patient reluctant to leave their home because of a fear of falling?  No   Home Social worker Private residence   Living Arrangements Alone   Available Help at Discharge Available PRN/intermittently   Type of Home Apartment   Prior Function   Level of Independence Independent   Cognition   Overall Cognitive Status Within Functional Limits for tasks assessed   Observation/Other Assessments   Observations pt appears generally deconditioned, moves easily, pes planus on both feet    Skin Integrity intact    AROM   Overall AROM  Within functional limits for tasks performed   Strength   Overall Strength Within functional limits for tasks performed   Palpation   Palpation comment bogginess palpated aound both ankles, left more than right            LYMPHEDEMA/ONCOLOGY QUESTIONNAIRE - 02/26/15 1534    Treatment   Active Chemotherapy Treatment Yes   What other symptoms do you have   Are you Having Heaviness or Tightness No   Are you having Pain No   Are you having pitting edema No   Is it Hard or Difficult finding clothes that fit --  pt able to wear tied tennis shoes   Do you have infections No   Is there Decreased scar mobility No   Stemmer Sign No   Lymphedema Stage   Stage STAGE 1 SPONTANEOUSLY REVERSIBLE   Right Lower Extremity  Lymphedema   At Midpatella/Popliteal Crease 33 cm   30 cm Proximal to Floor at Lateral Plantar Foot 31.9 cm   20 cm Proximal to Floor at Lateral Plantar Foot 23.'1 1   10 '$ cm Proximal to Floor at Lateral Malleoli 22 cm   5 cm Proximal to 1st MTP Joint 23 cm   Around Proximal  Great Toe 7.5 cm   Left Lower Extremity Lymphedema   At Midpatella/Popliteal Crease 34.6 cm   30 cm Proximal to Floor at Lateral Plantar Foot 32.8 cm   20 cm Proximal to Floor at Lateral Plantar Foot 23.6 cm   10 cm Proximal to Floor at Lateral Malleoli 22 cm   5 cm Proximal to 1st MTP Joint 23.5 cm   Around Proximal Great Toe 7.5 cm                [facility] Adult PT Treatment/Exercise - 03-07-15 0001    Self-Care   Other Self-Care Comments  ankle pumps in sitting and standing to help with fluid pump, gave catalog and flyer for more reasonable priced garments in     Ankle Exercises: Supine   Other Supine Ankle Exercises AROM wtih leg elevated  in all diretions,                PT Education - 07-Mar-2015 1516    Education provided Yes   Education Details concervative lyphedema management of elevation and exercise    Person(s) Educated Patient   Methods Explanation;Demonstration   Comprehension Verbalized understanding                [facility] Goals - 2015-03-07 1531    CC Long Term Goal  #1   Title Pt will verbalize knowledge of conservative treatment of  lymphedema with elevation and exercise   Time 1   Period Days   Status Achieved            Plan - 03-07-15 1517    Clinical Impression Statement 64 yo female who has been undergoing cancer treatment of different types for many years.  She has had swelling in her left leg that was more problematic duting the summer but seems to be less of a problem now.  She does have some bogginess around both ankles and left leg measures slightly greater than right.  Informed patient that Medicare does not ususlly cover compression  stockings. Provided her with her meausrements and a catalog and flyer for more inexpensive options in [place] she want to go there.  Do not feel she needs skilled PT follow up at this time.    Pt will benefit from skilled therapeutic intervention in order to improve on the following deficits Increased edema   Rehab Potential Excellent   Clinical Impairments Affecting Rehab Potential previous colon surgery with lymph node involvement, ongoing chemotherapy    PT Frequency One time visit   PT Treatment/Interventions Patient/family education   PT Next Visit Plan no schedules follow up   PT Home Exercise Plan lower extremity elevation and exercise    Consulted and Agree with Plan of Care Patient          G-Codes - 07-Mar-2015 1533    Functional Limitation Self care   Self Care Current Status (X3818) At least 80 percent but less than 100 percent impaired, limited or restricted   Self Care Goal Status (E9937) At least 20 percent but less than 40 percent impaired, limited or restricted   Self Care Discharge Status [phone]) At least 20 percent but less than 40 percent impaired, limited or restricted       Problem List Patient Active Problem List   Diagnosis Date Noted  . Family history of Huntington's disease   . LVH (left ventricular hypertrophy) 12/20/2014  . Other fatigue 11/28/2014  . Blepharitis of both eyes 11/09/2014  . Bilateral leg edema  10/17/2014  . Anemia due to chemotherapy 08/29/2014  . Neuropathy due to chemotherapeutic drug ([place]) 07/19/2014  . Left-sided low back pain with left-sided sciatica 07/18/2014  . Low back pain with sciatica 06/22/2014  . Colon cancer ([place]) 05/31/2013  . Fatigue 02/06/2013  . ASCUS with positive high risk HPV 04/21/2012  . Brain metastases treated with radiosurgery 02/26/2012  . Health care maintenance 01/20/2012  . Insomnia 10/05/2011  . Cancer of upper lobe of left lung, Adenocarcinoma   . COPD (chronic obstructive pulmonary disease) ([place])  05/24/2008  . TOBACCO ABUSE 10/04/2007  . Coronary atherosclerosis 09/09/2007  . HYPERCHOLESTEROLEMIA 12/29/2006  . DEPRESSIVE DISORDER, MAJOR, RCR, MILD 12/29/2006  . HYPERTENSION, BENIGN ESSENTIAL 12/29/2006  . CEREBRAL ANEURYSM 12/29/2006  . GERD 12/29/2006   Donato Heinz. Owens Shark, PT  02/26/2015, 3:44 PM  [place], [place], [address] Phone: [phone]   Fax:  [phone]

## 2015-02-26 NOTE — Telephone Encounter (Signed)
"  Could someone call me at [phone].  I'm having trouble eating."

## 2015-02-26 NOTE — Patient Instructions (Signed)
Elevate leg with ankle above knee above heart and draw the alphabet with your big toe

## 2015-02-26 NOTE — Telephone Encounter (Signed)
Tried to call x 2 experiencing  trouble with phone system.  Called at 2:27 pm and able to get out.  Left voicemail apologizing for delay as patient message was that she would be available until 2:00 pm.  With message advised she do good mouth care and try lozengers if bad taste in mouth.  FF to keep hydrated and boost or ensure to supplement meals.  Asked for return call and see collaborative nurse also trying to reach patient.

## 2015-02-26 NOTE — Patient Outreach (Signed)
[name]) Care Management  02/26/2015  Lauren [name] 1950-08-19 [id number]   Telephonic Monthly Assessment  Outreach call to patient.  Patient not reached.   RN CM left HIPAA compliant voice message with name and contact #.  RN CM will reschedule for next outreach attempt.   [name], RN, BSN, [facility], CCM  [employer] Management Coordinator [phone] Direct [phone] Cell [phone] Office [phone] Fax

## 2015-02-26 NOTE — Progress Notes (Signed)
   02/26/15 1448  Right Lower Extremity Lymphedema  At Midpatella/Popliteal Crease 33 cm  30 cm Proximal to Floor at Lateral Plantar Foot 31.9 cm  20 cm Proximal to Floor at Lateral Plantar Foot 23.'1 1  10 '$ cm Proximal to Floor at Lateral Malleoli 22 cm  5 cm Proximal to 1st MTP Joint 23 cm  Around Proximal Great Toe 7.5 cm  Left Lower Extremity Lymphedema  At Midpatella/Popliteal Crease 34.6 cm  30 cm Proximal to Floor at Lateral Plantar Foot 32.8 cm  20 cm Proximal to Floor at Lateral Plantar Foot 23.6 cm  10 cm Proximal to Floor at Lateral Malleoli 22 cm  5 cm Proximal to 1st MTP Joint 23.5 cm  Around Proximal Great Toe 7.5 cm

## 2015-02-26 NOTE — Telephone Encounter (Signed)
LVM for pt I was returning her call.  Asked her to call back again to discuss her concerns.

## 2015-03-04 ENCOUNTER — Ambulatory Visit: Payer: Self-pay

## 2015-03-07 ENCOUNTER — Ambulatory Visit: Payer: Self-pay

## 2015-03-13 ENCOUNTER — Ambulatory Visit: Payer: Commercial Managed Care - HMO

## 2015-03-13 ENCOUNTER — Ambulatory Visit (HOSPITAL_BASED_OUTPATIENT_CLINIC_OR_DEPARTMENT_OTHER): Payer: Commercial Managed Care - HMO

## 2015-03-13 ENCOUNTER — Ambulatory Visit (HOSPITAL_BASED_OUTPATIENT_CLINIC_OR_DEPARTMENT_OTHER): Payer: Commercial Managed Care - HMO | Admitting: Hematology and Oncology

## 2015-03-13 ENCOUNTER — Encounter: Payer: Self-pay | Admitting: Hematology and Oncology

## 2015-03-13 ENCOUNTER — Other Ambulatory Visit (HOSPITAL_BASED_OUTPATIENT_CLINIC_OR_DEPARTMENT_OTHER): Payer: Commercial Managed Care - HMO

## 2015-03-13 ENCOUNTER — Telehealth: Payer: Self-pay | Admitting: Hematology and Oncology

## 2015-03-13 ENCOUNTER — Ambulatory Visit: Payer: Commercial Managed Care - HMO | Admitting: Nutrition

## 2015-03-13 VITALS — BP 136/60 | HR 81 | Temp 98.2°F | Resp 18 | Ht 64.0 in | Wt 163.2 lb

## 2015-03-13 DIAGNOSIS — C7949 Secondary malignant neoplasm of other parts of nervous system: Secondary | ICD-10-CM

## 2015-03-13 DIAGNOSIS — C7931 Secondary malignant neoplasm of brain: Secondary | ICD-10-CM

## 2015-03-13 DIAGNOSIS — F17200 Nicotine dependence, unspecified, uncomplicated: Secondary | ICD-10-CM

## 2015-03-13 DIAGNOSIS — E46 Unspecified protein-calorie malnutrition: Secondary | ICD-10-CM | POA: Diagnosis not present

## 2015-03-13 DIAGNOSIS — C3412 Malignant neoplasm of upper lobe, left bronchus or lung: Secondary | ICD-10-CM

## 2015-03-13 DIAGNOSIS — Z95828 Presence of other vascular implants and grafts: Secondary | ICD-10-CM

## 2015-03-13 DIAGNOSIS — Z5111 Encounter for antineoplastic chemotherapy: Secondary | ICD-10-CM

## 2015-03-13 DIAGNOSIS — R5383 Other fatigue: Secondary | ICD-10-CM

## 2015-03-13 DIAGNOSIS — Z720 Tobacco use: Secondary | ICD-10-CM

## 2015-03-13 LAB — CBC WITH DIFFERENTIAL/PLATELET
BASO%: 0.5 % (ref 0.0–2.0)
Basophils Absolute: 0 10*3/uL (ref 0.0–0.1)
EOS%: 1.9 % (ref 0.0–7.0)
Eosinophils Absolute: 0.1 10*3/uL (ref 0.0–0.5)
HCT: 33.6 % — ABNORMAL LOW (ref 34.8–46.6)
HGB: 10.4 g/dL — ABNORMAL LOW (ref 11.6–15.9)
LYMPH%: 15.7 % (ref 14.0–49.7)
MCH: 27.2 pg (ref 25.1–34.0)
MCHC: 31 g/dL — AB (ref 31.5–36.0)
MCV: 88 fL (ref 79.5–101.0)
MONO#: 0.7 10*3/uL (ref 0.1–0.9)
MONO%: 15.5 % — AB (ref 0.0–14.0)
NEUT%: 66.4 % (ref 38.4–76.8)
NEUTROS ABS: 2.8 10*3/uL (ref 1.5–6.5)
Platelets: 300 10*3/uL (ref 145–400)
RBC: 3.82 10*6/uL (ref 3.70–5.45)
RDW: 17.7 % — ABNORMAL HIGH (ref 11.2–14.5)
WBC: 4.3 10*3/uL (ref 3.9–10.3)
lymph#: 0.7 10*3/uL — ABNORMAL LOW (ref 0.9–3.3)

## 2015-03-13 LAB — COMPREHENSIVE METABOLIC PANEL (CC13)
AST: 12 U/L (ref 5–34)
Albumin: 2.8 g/dL — ABNORMAL LOW (ref 3.5–5.0)
Alkaline Phosphatase: 110 U/L (ref 40–150)
Anion Gap: 9 mEq/L (ref 3–11)
BILIRUBIN TOTAL: 0.41 mg/dL (ref 0.20–1.20)
BUN: 9.3 mg/dL (ref 7.0–26.0)
CHLORIDE: 108 meq/L (ref 98–109)
CO2: 26 meq/L (ref 22–29)
CREATININE: 1.1 mg/dL (ref 0.6–1.1)
Calcium: 9.2 mg/dL (ref 8.4–10.4)
EGFR: 58 mL/min/{1.73_m2} — AB (ref >90)
GLUCOSE: 103 mg/dL (ref 70–140)
Potassium: 3.5 mEq/L (ref 3.5–5.1)
SODIUM: 142 meq/L (ref 136–145)
TOTAL PROTEIN: 7 g/dL (ref 6.4–8.3)

## 2015-03-13 MED ORDER — SODIUM CHLORIDE 0.9 % IV SOLN
Freq: Once | INTRAVENOUS | Status: AC
  Administered 2015-03-13: 11:00:00 via INTRAVENOUS
  Filled 2015-03-13: qty 4

## 2015-03-13 MED ORDER — SODIUM CHLORIDE 0.9 % IJ SOLN
10.0000 mL | INTRAMUSCULAR | Status: DC | PRN
  Administered 2015-03-13: 10 mL
  Filled 2015-03-13: qty 10

## 2015-03-13 MED ORDER — SODIUM CHLORIDE 0.9 % IJ SOLN
10.0000 mL | INTRAMUSCULAR | Status: DC | PRN
  Administered 2015-03-13: 10 mL via INTRAVENOUS
  Filled 2015-03-13: qty 10

## 2015-03-13 MED ORDER — SODIUM CHLORIDE 0.9 % IV SOLN
480.0000 mg/m2 | Freq: Once | INTRAVENOUS | Status: AC
  Administered 2015-03-13: 900 mg via INTRAVENOUS
  Filled 2015-03-13: qty 32

## 2015-03-13 MED ORDER — SODIUM CHLORIDE 0.9 % IV SOLN
Freq: Once | INTRAVENOUS | Status: AC
  Administered 2015-03-13: 10:00:00 via INTRAVENOUS

## 2015-03-13 MED ORDER — HEPARIN SOD (PORK) LOCK FLUSH 100 UNIT/ML IV SOLN
500.0000 [IU] | Freq: Once | INTRAVENOUS | Status: AC | PRN
  Administered 2015-03-13: 500 [IU]
  Filled 2015-03-13: qty 5

## 2015-03-13 NOTE — Patient Instructions (Signed)

## 2015-03-13 NOTE — Telephone Encounter (Signed)
Gave and printed appt sched and avs for pt for NOV and DEC

## 2015-03-13 NOTE — Patient Instructions (Signed)
[facility] Discharge Instructions for Patients Receiving Chemotherapy  Today you received the following chemotherapy agents; Alimta.   To help prevent nausea and vomiting after your treatment, we encourage you to take your nausea medication as directed.    If you develop nausea and vomiting that is not controlled by your nausea medication, call the clinic.   BELOW ARE SYMPTOMS THAT SHOULD BE REPORTED IMMEDIATELY:  *FEVER GREATER THAN 100.5 F  *CHILLS WITH OR WITHOUT FEVER  NAUSEA AND VOMITING THAT IS NOT CONTROLLED WITH YOUR NAUSEA MEDICATION  *UNUSUAL SHORTNESS OF BREATH  *UNUSUAL BRUISING OR BLEEDING  TENDERNESS IN MOUTH AND THROAT WITH OR WITHOUT PRESENCE OF ULCERS  *URINARY PROBLEMS  *BOWEL PROBLEMS  UNUSUAL RASH Items with * indicate a potential emergency and should be followed up as soon as possible.  Feel free to call the clinic you have any questions or concerns. The clinic phone number is [phone].  Please show the CHEMO ALERT CARD at check-in to the Emergency Department and triage nurse.   

## 2015-03-13 NOTE — Patient Instructions (Signed)
Nivolumab injection What is this medicine? NIVOLUMAB (nye VOL ue mab) is a monoclonal antibody. It is used to treat melanoma, lung cancer, kidney cancer, and Hodgkin lymphoma. This medicine may be used for other purposes; ask your health care provider or pharmacist if you have questions. What should I tell my health care provider before I take this medicine? They need to know if you have any of these conditions: -diabetes -immune system problems -kidney disease -liver disease -lung disease -organ transplant -stomach or intestine problems -thyroid disease -an unusual or allergic reaction to nivolumab, other medicines, foods, dyes, or preservatives -pregnant or trying to get pregnant -breast-feeding How should I use this medicine? This medicine is for infusion into a vein. It is given by a health care professional in a hospital or clinic setting. A special MedGuide will be given to you before each treatment. Be sure to read this information carefully each time. Talk to your pediatrician regarding the use of this medicine in children. Special care may be needed. Overdosage: If you think you have taken too much of this medicine contact a poison control center or emergency room at once. NOTE: This medicine is only for you. Do not share this medicine with others. What if I miss a dose? It is important not to miss your dose. Call your doctor or health care professional if you are unable to keep an appointment. What may interact with this medicine? Interactions have not been studied. Give your health care provider a list of all the medicines, herbs, non-prescription drugs, or dietary supplements you use. Also tell them if you smoke, drink alcohol, or use illegal drugs. Some items may interact with your medicine. This list may not describe all possible interactions. Give your health care provider a list of all the medicines, herbs, non-prescription drugs, or dietary supplements you use. Also tell  them if you smoke, drink alcohol, or use illegal drugs. Some items may interact with your medicine. What should I watch for while using this medicine? This drug may make you feel generally unwell. Continue your course of treatment even though you feel ill unless your doctor tells you to stop. You may need blood work done while you are taking this medicine. Do not become pregnant while taking this medicine or for 5 months after stopping it. Women should inform their doctor if they wish to become pregnant or think they might be pregnant. There is a potential for serious side effects to an unborn child. Talk to your health care professional or pharmacist for more information. Do not breast-feed an infant while taking this medicine. What side effects may I notice from receiving this medicine? Side effects that you should report to your doctor or health care professional as soon as possible: -allergic reactions like skin rash, itching or hives, swelling of the face, lips, or tongue -black, tarry stools -blood in the urine -bloody or watery diarrhea -changes in vision -change in sex drive -changes in emotions or moods -chest pain -confusion -cough -decreased appetite -diarrhea -facial flushing -feeling faint or lightheaded -fever, chills -hair loss -hallucination, loss of contact with reality -headache -irritable -joint pain -loss of memory -muscle pain -muscle weakness -seizures -shortness of breath -signs and symptoms of high blood sugar such as dizziness; dry mouth; dry skin; fruity breath; nausea; stomach pain; increased hunger or thirst; increased urination -signs and symptoms of kidney injury like trouble passing urine or change in the amount of urine -signs and symptoms of liver injury like dark yellow or  brown urine; general ill feeling or flu-like symptoms; light-colored stools; loss of appetite; nausea; right upper belly pain; unusually weak or tired; yellowing of the eyes or  skin -stiff neck -swelling of the ankles, feet, hands -weight gain Side effects that usually do not require medical attention (report to your doctor or health care professional if they continue or are bothersome): -bone pain -constipation -tiredness -vomiting This list may not describe all possible side effects. Call your doctor for medical advice about side effects. You may report side effects to FDA at 1-800-FDA-1088. Where should I keep my medicine? This drug is given in a hospital or clinic and will not be stored at home. NOTE: This sheet is a summary. It may not cover all possible information. If you have questions about this medicine, talk to your doctor, pharmacist, or health care provider.    2016, Elsevier/Gold Standard. (2014-10-03 10:03:42) Pembrolizumab injection What is this medicine? PEMBROLIZUMAB (pem broe liz ue mab) is a monoclonal antibody. It is used to treat melanoma and non-small cell lung cancer. This medicine may be used for other purposes; ask your health care provider or pharmacist if you have questions. What should I tell my health care provider before I take this medicine? They need to know if you have any of these conditions: -diabetes -immune system problems -inflammatory bowel disease -liver disease -lung or breathing disease -lupus -an unusual or allergic reaction to pembrolizumab, other medicines, foods, dyes, or preservatives -pregnant or trying to get pregnant -breast-feeding How should I use this medicine? This medicine is for infusion into a vein. It is given by a health care professional in a hospital or clinic setting. A special MedGuide will be given to you before each treatment. Be sure to read this information carefully each time. Talk to your pediatrician regarding the use of this medicine in children. Special care may be needed. Overdosage: If you think you have taken too much of this medicine contact a poison control center or emergency  room at once. NOTE: This medicine is only for you. Do not share this medicine with others. What if I miss a dose? It is important not to miss your dose. Call your doctor or health care professional if you are unable to keep an appointment. What may interact with this medicine? Interactions have not been studied. Give your health care provider a list of all the medicines, herbs, non-prescription drugs, or dietary supplements you use. Also tell them if you smoke, drink alcohol, or use illegal drugs. Some items may interact with your medicine. This list may not describe all possible interactions. Give your health care provider a list of all the medicines, herbs, non-prescription drugs, or dietary supplements you use. Also tell them if you smoke, drink alcohol, or use illegal drugs. Some items may interact with your medicine. What should I watch for while using this medicine? Your condition will be monitored carefully while you are receiving this medicine. You may need blood work done while you are taking this medicine. Do not become pregnant while taking this medicine or for 4 months after stopping it. Women should inform their doctor if they wish to become pregnant or think they might be pregnant. There is a potential for serious side effects to an unborn child. Talk to your health care professional or pharmacist for more information. Do not breast-feed an infant while taking this medicine or for 4 months after the last dose. What side effects may I notice from receiving this medicine? Side effects  that you should report to your doctor or health care professional as soon as possible: -allergic reactions like skin rash, itching or hives, swelling of the face, lips, or tongue -bloody or black, tarry stools -breathing problems -change in the amount of urine -changes in vision -chest pain -chills -dark urine -dizziness or feeling faint or lightheaded -fast or irregular  heartbeat -fever -flushing -hair loss -muscle pain -muscle weakness -persistent headache -signs and symptoms of high blood sugar such as dizziness; dry mouth; dry skin; fruity breath; nausea; stomach pain; increased hunger or thirst; increased urination -signs and symptoms of liver injury like dark urine, light-colored stools, loss of appetite, nausea, right upper belly pain, yellowing of the eyes or skin -stomach pain -weight loss Side effects that usually do not require medical attention (Report these to your doctor or health care professional if they continue or are bothersome.):constipation -cough -diarrhea -joint pain -tiredness This list may not describe all possible side effects. Call your doctor for medical advice about side effects. You may report side effects to FDA at 1-800-FDA-1088. Where should I keep my medicine? This drug is given in a hospital or clinic and will not be stored at home. NOTE: This sheet is a summary. It may not cover all possible information. If you have questions about this medicine, talk to your doctor, pharmacist, or health care provider.    2016, Elsevier/Gold Standard. (2014-07-03 17:24:19)

## 2015-03-13 NOTE — Progress Notes (Signed)
Nutrition follow up completed with patient during infusion for lung cancer. Patient reports poor appetite and states she does not want to eat foods but liquids are easier. Is particular about her protein foods. Weight is stable at 163.2 pounds. Continues to drink CIB once or twice every other day.  Nutrition Diagnosis:  Unintended weight loss resolved. New diagnosis:  Food and Nutrition Related Knowledge deficit related to chemotherapy as evidenced by poor appetite.  Intervention:  Educated patient to consume increased calories and protein in small frequent meals and snacks. Recommended increase in CIB to once or twice daily. Provided coupons. Provided fact sheets on calories and protein food examples. Questions answered and teach back method used.  Monitoring, Evaluation, Goals:  Patient will increase calories and protein to improve appetite.  Next Visit:  Patient will contact me for further questions.

## 2015-03-14 DIAGNOSIS — E46 Unspecified protein-calorie malnutrition: Secondary | ICD-10-CM | POA: Insufficient documentation

## 2015-03-14 NOTE — Assessment & Plan Note (Signed)
There could be an element of depression. I have consulted dietitian. I recommend she takes Remeron consistently.

## 2015-03-14 NOTE — Assessment & Plan Note (Signed)
Her most recent MRI showed no evidence of disease recurrence. Continue to monitor carefully.

## 2015-03-14 NOTE — Assessment & Plan Note (Signed)
I spent some time counseling the patient the importance of tobacco cessation. She is currently not interested to quit now. 

## 2015-03-14 NOTE — Assessment & Plan Note (Signed)
This is related to side effects of treatment. TSH checked recently were within normal limits

## 2015-03-14 NOTE — Progress Notes (Signed)
[facility] OFFICE PROGRESS NOTE  Patient Care Team: [name], MD as PCP - General (Family Medicine) Gaye Pollack, MD (Cardiothoracic Surgery) Thea Silversmith, MD (Radiation Oncology) Dickie La, MD (Family Medicine) Heath Lark, MD as Consulting Physician (Hematology and Oncology) Renella Cunas, MD as Consulting Physician (Cardiology) Greg Cutter, LCSW as [facility] (Licensed Clinical Social Worker) Standley Brooking, RN as [facility]  SUMMARY OF ONCOLOGIC HISTORY: Oncology History   Colon cancer   Primary site: Colon and Rectum (Left)   Staging method: AJCC 7th Edition   Clinical: Stage I (T2, N0, M0) signed by Heath Lark, MD on 05/31/2013  2:39 PM   Pathologic: Stage I (T2, N0, cM0) signed by Heath Lark, MD on 05/31/2013  2:39 PM   Summary: Stage I (T2, N0, cM0) Lung cancer, EGFR/ALK negative, recurrence after initial resection to LN and brain   Primary site: Lung (Left)   Staging method: AJCC 7th Edition   Clinical: Stage IV (T1, N2, M1b) signed by Heath Lark, MD on 05/31/2013  2:26 PM   Pathologic: Stage IV (T1, N2, M1b) signed by Heath Lark, MD on 05/31/2013  2:26 PM   Summary: Stage IV (T1, N2, M1b)       Cancer of upper lobe of left lung, Adenocarcinoma   03/01/2007 Procedure Colonoscopy revealed abnormalities and biopsy show high-grade dysplasia   04/01/2007 Surgery She underwent sigmoid resection which showed T2 N0 colon cancer, and negative margins and all of 17 lymph nodes were negative   02/29/2008 Procedure Repeat surveillance colonoscopy was negative.   06/16/2010 Surgery She underwent left upper lobectomy we show well-differentiated adenocarcinoma of the lung, T1, N0, M0   03/18/2011 Procedure Repeat colonoscopy show multiple polyps but there were benign   10/19/2011 Procedure Biopsy of mediastinal lymph node came back positive for recurrence of lung cancer, EGFR and ALK negative   11/16/2011 - 12/14/2011 Chemotherapy She received concurrent chemoradiation therapy with weekly carboplatin and Taxol.   11/16/2011 - 12/29/2011 Radiation Therapy She received radiation therapy with weekly chemotherapy   02/15/2012 Imaging MR of the brain showed a new intracranial metastases. This was subsequently treated with stereotactic radiosurgery.   03/07/2012 - 04/12/2013 Chemotherapy She received chemotherapy with maintainence Alimta every 3 weeks. Chemotherapy was discontinued due to profound fatigue   03/02/2013 Procedure She had therapeutic ultrasound guidance thoracentesis for pleural effusion that came back negative for cancer   05/04/2013 Procedure She had repeat ultrasound-guided thoracentesis again and cytology was negative   05/29/2013 Imaging Repeat CT scan of the chest, abdomen and pelvis show no evidence of disease but persistent right-sided pleural effusion   06/02/2013 Surgery The patient had placement of Pleurx catheter and subsequently underwent pleurodesis.   09/22/2013 Imaging Repeat CT scan show no evidence of active disease. There are nonspecific lymphadenopathy and she is placed on observation.   03/23/2014 Imaging Repeat CT scan of the chest, abdomen and pelvis show recurrence of cancer with widespread bilateral pulmonary metastasis.   05/14/2014 Imaging Imaging of the chest and brain were repeated due to delay of initiation of chemotherapy. Overall, chest CT scan show stable disease. MRI of the head was negative for recurrence   05/16/2014 - 07/18/2014 Chemotherapy  she completed 4 cycles of combination chemotherapy with carboplatin and Alimta   07/16/2014 Imaging  repeat CT scan of the chest, abdomen and pelvis show regression in the size of lung nodules.   10/16/2014 Imaging  repeat CT  scan of the chest, abdomen and pelvis show regression in the size of lung nodules.   01/29/2015 Imaging Repeat CT scan showed stable disease   02/01/2015 Imaging MRI brain is negative    Brain  metastases treated with radiosurgery   02/26/2012 Initial Diagnosis Brain metastases treated with radiosurgery    INTERVAL HISTORY: Please see below for problem oriented charting. She returns for further follow-up and prior to chemotherapy. Her daughter is present. The patient is chronically fatigued and appears clinically depressed. She is not eating well. She has chronic bilateral lower extremity edema. She continues to smoke and unwilling to quit. She denies recent cough or infection.  REVIEW OF SYSTEMS:   Constitutional: Denies fevers, chills or abnormal weight loss Eyes: Denies blurriness of vision Ears, nose, mouth, throat, and face: Denies mucositis or sore throat Respiratory: Denies cough, dyspnea or wheezes Cardiovascular: Denies palpitation, chest discomfort  Gastrointestinal:  Denies nausea, heartburn or change in bowel habits Skin: Denies abnormal skin rashes Lymphatics: Denies new lymphadenopathy or easy bruising Neurological:Denies numbness, tingling or new weaknesses Behavioral/Psych: Mood is stable, no new changes  All other systems were reviewed with the patient and are negative.  I have reviewed the past medical history, past surgical history, social history and family history with the patient and they are unchanged from previous note.  ALLERGIES:  is allergic to adhesive and lisinopril.  MEDICATIONS:  Current Outpatient Prescriptions  Medication Sig Dispense Refill  . albuterol (PROVENTIL HFA;VENTOLIN HFA) 108 (90 BASE) MCG/ACT inhaler Inhale 1 puff into the lungs 2 (two) times daily. 6.7 g prn  . aspirin 81 MG chewable tablet Chew 81 mg by mouth daily.    . diclofenac sodium (VOLTAREN) 1 % GEL Apply 2 g topically 4 (four) times daily. As needed for pain 626 g 11  . folic acid (FOLVITE) 1 MG tablet Take 1 tablet (1 mg total) by mouth daily. 90 tablet 3  . furosemide (LASIX) 20 MG tablet Take 1 tablet (20 mg total) by mouth daily. 90 tablet 3  .  lidocaine-prilocaine (EMLA) cream Apply topically as needed. Apply to porta cath site one hour prior to needle stick. 30 g 6  . mirtazapine (REMERON) 15 MG tablet Take 1 tablet (15 mg total) by mouth at bedtime. 90 tablet 0  . nebivolol (BYSTOLIC) 10 MG tablet Take 1 tablet (10 mg total) by mouth daily. 90 tablet 4  . nitroGLYCERIN (NITROSTAT) 0.4 MG SL tablet Place 1 tablet (0.4 mg total) under the tongue every 5 (five) minutes as needed. For chest pain 25 tablet 5  . omeprazole (PRILOSEC) 40 MG capsule Take 1 capsule (40 mg total) by mouth daily. 90 capsule 3  . PATADAY 0.2 % SOLN     . potassium chloride SA (K-DUR,KLOR-CON) 20 MEQ tablet Take 1 tablet (20 mEq total) by mouth 2 (two) times daily. 180 tablet 0  . pravastatin (PRAVACHOL) 40 MG tablet TAKE 1 TABLET BY MOUTH ONCE DAILY 90 tablet 3  . tiZANidine (ZANAFLEX) 2 MG tablet Take 1 tablet (2 mg total) by mouth every 6 (six) hours as needed (Back Muscle Spasm). 30 tablet 3  . traMADol (ULTRAM) 50 MG tablet Take 1 tablet (50 mg total) by mouth every 6 (six) hours as needed for moderate pain or severe pain. 90 tablet 1  . [DISCONTINUED] buPROPion (WELLBUTRIN XL) 150 MG 24 hr tablet Take 1 tablet (150 mg total) by mouth 2 (two) times daily. 60 tablet 5   No current facility-administered medications for this  visit.    PHYSICAL EXAMINATION: ECOG PERFORMANCE STATUS: 1 - Symptomatic but completely ambulatory  Filed Vitals:   03/13/15 0934  BP: 136/60  Pulse: 81  Temp: 98.2 F (36.8 C)  Resp: 18   Filed Weights   03/13/15 0934  Weight: 163 lb 3.2 oz (74.027 kg)    GENERAL:alert, no distress and comfortable SKIN: skin color, texture, turgor are normal, no rashes or significant lesions EYES: normal, Conjunctiva are pink and non-injected, sclera clear OROPHARYNX:no exudate, no erythema and lips, buccal mucosa, and tongue normal  NECK: supple, thyroid normal size, non-tender, without nodularity LYMPH:  no palpable lymphadenopathy in the  cervical, axillary or inguinal LUNGS: clear to auscultation and percussion with normal breathing effort HEART: regular rate & rhythm and no murmurs with mild bilateral lower extremity edema ABDOMEN:abdomen soft, non-tender and normal bowel sounds Musculoskeletal:no cyanosis of digits and no clubbing  NEURO: alert & oriented x 3 with fluent speech, no focal motor/sensory deficits  LABORATORY DATA:  I have reviewed the data as listed    Component Value Date/Time   NA 142 03/13/2015 0911   NA 141 12/26/2013 0946   NA 143 09/28/2011 0833   K 3.5 03/13/2015 0911   K 4.0 12/26/2013 0946   K 3.5 09/28/2011 0833   CL 106 12/26/2013 0946   CL 102 10/24/2012 1001   CL 98 09/28/2011 0833   CO2 26 03/13/2015 0911   CO2 27 12/26/2013 0946   CO2 30 09/28/2011 0833   GLUCOSE 103 03/13/2015 0911   GLUCOSE 101* 12/26/2013 0946   GLUCOSE 111* 10/24/2012 1001   GLUCOSE 112 09/28/2011 0833   BUN 9.3 03/13/2015 0911   BUN 9 12/26/2013 0946   BUN 9 09/28/2011 0833   CREATININE 1.1 03/13/2015 0911   CREATININE 0.83 12/26/2013 0946   CREATININE 0.7 09/28/2011 0833   CALCIUM 9.2 03/13/2015 0911   CALCIUM 8.8 12/26/2013 0946   CALCIUM 8.7 09/28/2011 0833   PROT 7.0 03/13/2015 0911   PROT 6.8 12/26/2013 0946   PROT 7.2 09/28/2011 0833   ALBUMIN 2.8* 03/13/2015 0911   ALBUMIN 3.8 12/26/2013 0946   ALBUMIN 3.2* 09/28/2011 0833   AST 12 03/13/2015 0911   AST 12 12/26/2013 0946   AST 15 09/28/2011 0833   ALT <9 03/13/2015 0911   ALT <8 12/26/2013 0946   ALT 16 09/28/2011 0833   ALKPHOS 110 03/13/2015 0911   ALKPHOS 148* 12/26/2013 0946   ALKPHOS 99* 09/28/2011 0833   BILITOT 0.41 03/13/2015 0911   BILITOT 0.6 12/26/2013 0946   BILITOT 0.80 09/28/2011 0833   GFRNONAA 74* 06/12/2013 0625   GFRAA 86* 06/12/2013 0625    No results found for: SPEP, UPEP  Lab Results  Component Value Date   WBC 4.3 03/13/2015   NEUTROABS 2.8 03/13/2015   HGB 10.4* 03/13/2015   HCT 33.6* 03/13/2015   MCV  88.0 03/13/2015   PLT 300 03/13/2015      Chemistry      Component Value Date/Time   NA 142 03/13/2015 0911   NA 141 12/26/2013 0946   NA 143 09/28/2011 0833   K 3.5 03/13/2015 0911   K 4.0 12/26/2013 0946   K 3.5 09/28/2011 0833   CL 106 12/26/2013 0946   CL 102 10/24/2012 1001   CL 98 09/28/2011 0833   CO2 26 03/13/2015 0911   CO2 27 12/26/2013 0946   CO2 30 09/28/2011 0833   BUN 9.3 03/13/2015 0911   BUN 9 12/26/2013 0946  BUN 9 09/28/2011 0833   CREATININE 1.1 03/13/2015 0911   CREATININE 0.83 12/26/2013 0946   CREATININE 0.7 09/28/2011 0833      Component Value Date/Time   CALCIUM 9.2 03/13/2015 0911   CALCIUM 8.8 12/26/2013 0946   CALCIUM 8.7 09/28/2011 0833   ALKPHOS 110 03/13/2015 0911   ALKPHOS 148* 12/26/2013 0946   ALKPHOS 99* 09/28/2011 0833   AST 12 03/13/2015 0911   AST 12 12/26/2013 0946   AST 15 09/28/2011 0833   ALT <9 03/13/2015 0911   ALT <8 12/26/2013 0946   ALT 16 09/28/2011 0833   BILITOT 0.41 03/13/2015 0911   BILITOT 0.6 12/26/2013 0946   BILITOT 0.80 09/28/2011 0833     ASSESSMENT & PLAN:  Cancer of upper lobe of left lung, Adenocarcinoma The patient has excessive fatigue and would like to stop treatment. Her daughter is present. We discussed the risk and benefits of discontinuation chemotherapy. After a very prolonged discussion, she is in agreement to continue treatment until December. I plan to reorder another set of imaging studies at that time and if her disease remained under control, we can consider either stopping treatment or switching her to immunotherapy. I gave her some patient education handout regarding use of Nivolumab and Pembrolizumab I will see her in December will results of imaging study and will discuss further with her. In the meantime, we will continue same treatment without dose adjustment.  TOBACCO ABUSE I spent some time counseling the patient the importance of tobacco cessation. She is currently not  interested to quit now.     Other fatigue This is related to side effects of treatment. TSH checked recently were within normal limits  Protein calorie malnutrition ([place]) There could be an element of depression. I have consulted dietitian. I recommend she takes Remeron consistently.  Brain metastases treated with radiosurgery Her most recent MRI showed no evidence of disease recurrence. Continue to monitor carefully.   Orders Placed This Encounter  Procedures  . CT Chest W Contrast    Standing Status: Future     Number of Occurrences:      Standing Expiration Date: 05/12/2016    Order Specific Question:  Reason for Exam (SYMPTOM  OR DIAGNOSIS REQUIRED)    Answer:  lung ca staging assess response to Rx    Order Specific Question:  Preferred imaging location?    Answer:  [facility]  . CT Abdomen Pelvis W Contrast    Standing Status: Future     Number of Occurrences:      Standing Expiration Date: 06/12/2016    Order Specific Question:  Reason for Exam (SYMPTOM  OR DIAGNOSIS REQUIRED)    Answer:  lung ca staging assess response to Rx    Order Specific Question:  Preferred imaging location?    Answer:  [facility]   All questions were answered. The patient knows to call the clinic with any problems, questions or concerns. No barriers to learning was detected. I spent 30 minutes counseling the patient face to face. The total time spent in the appointment was 40 minutes and more than 50% was on counseling and review of test results     [facility], [place], MD 03/14/2015 7:46 AM

## 2015-03-14 NOTE — Assessment & Plan Note (Signed)
The patient has excessive fatigue and would like to stop treatment. Her daughter is present. We discussed the risk and benefits of discontinuation chemotherapy. After a very prolonged discussion, she is in agreement to continue treatment until December. I plan to reorder another set of imaging studies at that time and if her disease remained under control, we can consider either stopping treatment or switching her to immunotherapy. I gave her some patient education handout regarding use of Nivolumab and Pembrolizumab I will see her in December will results of imaging study and will discuss further with her. In the meantime, we will continue same treatment without dose adjustment.

## 2015-03-19 ENCOUNTER — Other Ambulatory Visit: Payer: Self-pay

## 2015-03-19 NOTE — Patient Outreach (Signed)
[facility]) Care Management  03/19/2015  KATALIN COLLEDGE 07/16/1950 [id number]  Telephonic Monthly Assessment  Outreach call #2 to patient. Patient not reached. Plan:  RN CM left HIPAA compliant voice message with name and contact #.  RN CM will reschedule for next outreach attempt.   [name], RN, BSN, [facility], CCM  [employer] Management Coordinator [phone] Direct [phone] Cell [phone] Office [phone] Fax

## 2015-04-03 ENCOUNTER — Other Ambulatory Visit (HOSPITAL_BASED_OUTPATIENT_CLINIC_OR_DEPARTMENT_OTHER): Payer: Commercial Managed Care - HMO

## 2015-04-03 ENCOUNTER — Ambulatory Visit (HOSPITAL_BASED_OUTPATIENT_CLINIC_OR_DEPARTMENT_OTHER): Payer: Commercial Managed Care - HMO

## 2015-04-03 ENCOUNTER — Ambulatory Visit: Payer: Commercial Managed Care - HMO

## 2015-04-03 VITALS — BP 143/60 | HR 79 | Temp 97.9°F | Resp 18

## 2015-04-03 DIAGNOSIS — C3412 Malignant neoplasm of upper lobe, left bronchus or lung: Secondary | ICD-10-CM

## 2015-04-03 DIAGNOSIS — Z95828 Presence of other vascular implants and grafts: Secondary | ICD-10-CM

## 2015-04-03 DIAGNOSIS — Z5111 Encounter for antineoplastic chemotherapy: Secondary | ICD-10-CM

## 2015-04-03 LAB — COMPREHENSIVE METABOLIC PANEL (CC13)
ALBUMIN: 2.7 g/dL — AB (ref 3.5–5.0)
ANION GAP: 11 meq/L (ref 3–11)
AST: 13 U/L (ref 5–34)
Alkaline Phosphatase: 101 U/L (ref 40–150)
BUN: 8.9 mg/dL (ref 7.0–26.0)
CALCIUM: 8.9 mg/dL (ref 8.4–10.4)
CO2: 23 mEq/L (ref 22–29)
CREATININE: 1.2 mg/dL — AB (ref 0.6–1.1)
Chloride: 110 mEq/L — ABNORMAL HIGH (ref 98–109)
EGFR: 54 mL/min/{1.73_m2} — ABNORMAL LOW (ref >90)
Glucose: 104 mg/dl (ref 70–140)
Potassium: 3.7 mEq/L (ref 3.5–5.1)
Sodium: 144 mEq/L (ref 136–145)
TOTAL PROTEIN: 6.6 g/dL (ref 6.4–8.3)

## 2015-04-03 LAB — CBC WITH DIFFERENTIAL/PLATELET
BASO%: 0.6 % (ref 0.0–2.0)
Basophils Absolute: 0 10*3/uL (ref 0.0–0.1)
EOS%: 1.4 % (ref 0.0–7.0)
Eosinophils Absolute: 0.1 10*3/uL (ref 0.0–0.5)
HEMATOCRIT: 33.4 % — AB (ref 34.8–46.6)
HEMOGLOBIN: 10.3 g/dL — AB (ref 11.6–15.9)
LYMPH#: 0.6 10*3/uL — AB (ref 0.9–3.3)
LYMPH%: 16 % (ref 14.0–49.7)
MCH: 27.1 pg (ref 25.1–34.0)
MCHC: 30.8 g/dL — AB (ref 31.5–36.0)
MCV: 87.9 fL (ref 79.5–101.0)
MONO#: 0.6 10*3/uL (ref 0.1–0.9)
MONO%: 16.5 % — ABNORMAL HIGH (ref 0.0–14.0)
NEUT#: 2.3 10*3/uL (ref 1.5–6.5)
NEUT%: 65.5 % (ref 38.4–76.8)
PLATELETS: 357 10*3/uL (ref 145–400)
RBC: 3.8 10*6/uL (ref 3.70–5.45)
RDW: 18.1 % — ABNORMAL HIGH (ref 11.2–14.5)
WBC: 3.6 10*3/uL — ABNORMAL LOW (ref 3.9–10.3)

## 2015-04-03 MED ORDER — SODIUM CHLORIDE 0.9 % IV SOLN
Freq: Once | INTRAVENOUS | Status: AC
  Administered 2015-04-03: 10:00:00 via INTRAVENOUS

## 2015-04-03 MED ORDER — SODIUM CHLORIDE 0.9 % IJ SOLN
10.0000 mL | INTRAMUSCULAR | Status: DC | PRN
  Administered 2015-04-03: 10 mL
  Filled 2015-04-03: qty 10

## 2015-04-03 MED ORDER — SODIUM CHLORIDE 0.9 % IV SOLN
480.0000 mg/m2 | Freq: Once | INTRAVENOUS | Status: AC
  Administered 2015-04-03: 900 mg via INTRAVENOUS
  Filled 2015-04-03: qty 36

## 2015-04-03 MED ORDER — SODIUM CHLORIDE 0.9 % IJ SOLN
10.0000 mL | INTRAMUSCULAR | Status: DC | PRN
  Administered 2015-04-03: 10 mL via INTRAVENOUS
  Filled 2015-04-03: qty 10

## 2015-04-03 MED ORDER — HEPARIN SOD (PORK) LOCK FLUSH 100 UNIT/ML IV SOLN
500.0000 [IU] | Freq: Once | INTRAVENOUS | Status: AC | PRN
  Administered 2015-04-03: 500 [IU]
  Filled 2015-04-03: qty 5

## 2015-04-03 MED ORDER — SODIUM CHLORIDE 0.9 % IV SOLN
Freq: Once | INTRAVENOUS | Status: AC
  Administered 2015-04-03: 11:00:00 via INTRAVENOUS
  Filled 2015-04-03: qty 4

## 2015-04-03 NOTE — Patient Instructions (Signed)

## 2015-04-11 ENCOUNTER — Ambulatory Visit: Payer: Self-pay

## 2015-04-12 ENCOUNTER — Other Ambulatory Visit: Payer: Self-pay

## 2015-04-12 NOTE — Patient Outreach (Signed)
[facility]) Lauren [name]  04/12/2015  Lauren [name] 07-02-1950 [id number]  Outreach call to patient.  Patient reached but states she is unable to discuss her needs due to family visiting.  Patient request a call back at a later time today.   RN CM rescheduled for call back.    [name], RN, BSN, [facility], CCM  [employer] [name] Coordinator [phone] Direct [phone] Cell [phone] Office [phone] Fax

## 2015-04-24 ENCOUNTER — Encounter: Payer: Self-pay | Admitting: *Deleted

## 2015-04-24 ENCOUNTER — Other Ambulatory Visit (HOSPITAL_BASED_OUTPATIENT_CLINIC_OR_DEPARTMENT_OTHER): Payer: Commercial Managed Care - HMO

## 2015-04-24 ENCOUNTER — Ambulatory Visit: Payer: Commercial Managed Care - HMO

## 2015-04-24 ENCOUNTER — Ambulatory Visit (HOSPITAL_BASED_OUTPATIENT_CLINIC_OR_DEPARTMENT_OTHER): Payer: Commercial Managed Care - HMO

## 2015-04-24 VITALS — BP 138/67 | HR 76 | Temp 98.2°F | Resp 18

## 2015-04-24 DIAGNOSIS — Z5111 Encounter for antineoplastic chemotherapy: Secondary | ICD-10-CM | POA: Diagnosis not present

## 2015-04-24 DIAGNOSIS — C3412 Malignant neoplasm of upper lobe, left bronchus or lung: Secondary | ICD-10-CM | POA: Diagnosis not present

## 2015-04-24 DIAGNOSIS — Z95828 Presence of other vascular implants and grafts: Secondary | ICD-10-CM

## 2015-04-24 LAB — CBC WITH DIFFERENTIAL/PLATELET
BASO%: 0.9 % (ref 0.0–2.0)
BASOS ABS: 0 10*3/uL (ref 0.0–0.1)
EOS ABS: 0.1 10*3/uL (ref 0.0–0.5)
EOS%: 1.8 % (ref 0.0–7.0)
HEMATOCRIT: 31.3 % — AB (ref 34.8–46.6)
HEMOGLOBIN: 9.9 g/dL — AB (ref 11.6–15.9)
LYMPH#: 0.6 10*3/uL — AB (ref 0.9–3.3)
LYMPH%: 13 % — ABNORMAL LOW (ref 14.0–49.7)
MCH: 26.7 pg (ref 25.1–34.0)
MCHC: 31.7 g/dL (ref 31.5–36.0)
MCV: 84.2 fL (ref 79.5–101.0)
MONO#: 0.7 10*3/uL (ref 0.1–0.9)
MONO%: 14.9 % — AB (ref 0.0–14.0)
NEUT%: 69.4 % (ref 38.4–76.8)
NEUTROS ABS: 3.4 10*3/uL (ref 1.5–6.5)
Platelets: 395 10*3/uL (ref 145–400)
RBC: 3.71 10*6/uL (ref 3.70–5.45)
RDW: 19.3 % — AB (ref 11.2–14.5)
WBC: 4.9 10*3/uL (ref 3.9–10.3)

## 2015-04-24 LAB — COMPREHENSIVE METABOLIC PANEL
ALBUMIN: 2.7 g/dL — AB (ref 3.5–5.0)
ALK PHOS: 106 U/L (ref 40–150)
ALT: <9 U/L (ref 0–55)
AST: 13 U/L (ref 5–34)
Anion Gap: 11 mEq/L (ref 3–11)
BILIRUBIN TOTAL: 0.39 mg/dL (ref 0.20–1.20)
BUN: 8.5 mg/dL (ref 7.0–26.0)
CALCIUM: 9 mg/dL (ref 8.4–10.4)
CO2: 23 mEq/L (ref 22–29)
CREATININE: 1.2 mg/dL — AB (ref 0.6–1.1)
Chloride: 109 mEq/L (ref 98–109)
EGFR: 56 mL/min/{1.73_m2} — ABNORMAL LOW (ref >90)
GLUCOSE: 95 mg/dL (ref 70–140)
POTASSIUM: 3.7 meq/L (ref 3.5–5.1)
SODIUM: 143 meq/L (ref 136–145)
TOTAL PROTEIN: 7 g/dL (ref 6.4–8.3)

## 2015-04-24 MED ORDER — SODIUM CHLORIDE 0.9 % IV SOLN
Freq: Once | INTRAVENOUS | Status: AC
  Administered 2015-04-24: 10:00:00 via INTRAVENOUS
  Filled 2015-04-24: qty 4

## 2015-04-24 MED ORDER — HEPARIN SOD (PORK) LOCK FLUSH 100 UNIT/ML IV SOLN
500.0000 [IU] | Freq: Once | INTRAVENOUS | Status: DC | PRN
  Filled 2015-04-24: qty 5

## 2015-04-24 MED ORDER — SODIUM CHLORIDE 0.9 % IJ SOLN
10.0000 mL | INTRAMUSCULAR | Status: DC | PRN
  Administered 2015-04-24: 10 mL
  Filled 2015-04-24: qty 10

## 2015-04-24 MED ORDER — SODIUM CHLORIDE 0.9 % IJ SOLN
10.0000 mL | INTRAMUSCULAR | Status: DC | PRN
  Administered 2015-04-24: 10 mL via INTRAVENOUS
  Filled 2015-04-24: qty 10

## 2015-04-24 MED ORDER — CYANOCOBALAMIN 1000 MCG/ML IJ SOLN
INTRAMUSCULAR | Status: AC
  Filled 2015-04-24: qty 1

## 2015-04-24 MED ORDER — CYANOCOBALAMIN 1000 MCG/ML IJ SOLN
1000.0000 ug | Freq: Once | INTRAMUSCULAR | Status: AC
  Administered 2015-04-24: 1000 ug via INTRAMUSCULAR

## 2015-04-24 MED ORDER — HEPARIN SOD (PORK) LOCK FLUSH 100 UNIT/ML IV SOLN
500.0000 [IU] | Freq: Once | INTRAVENOUS | Status: DC
  Administered 2015-04-24: 500 [IU] via INTRAVENOUS
  Filled 2015-04-24: qty 5

## 2015-04-24 MED ORDER — SODIUM CHLORIDE 0.9 % IV SOLN
485.0000 mg/m2 | Freq: Once | INTRAVENOUS | Status: AC
  Administered 2015-04-24: 900 mg via INTRAVENOUS
  Filled 2015-04-24: qty 36

## 2015-04-24 MED ORDER — SODIUM CHLORIDE 0.9 % IV SOLN
Freq: Once | INTRAVENOUS | Status: AC
  Administered 2015-04-24: 10:00:00 via INTRAVENOUS

## 2015-04-24 NOTE — Patient Instructions (Signed)
[facility] Discharge Instructions for Patients Receiving Chemotherapy  Today you received the following chemotherapy agents; Alimta.   To help prevent nausea and vomiting after your treatment, we encourage you to take your nausea medication as directed.    If you develop nausea and vomiting that is not controlled by your nausea medication, call the clinic.   BELOW ARE SYMPTOMS THAT SHOULD BE REPORTED IMMEDIATELY:  *FEVER GREATER THAN 100.5 F  *CHILLS WITH OR WITHOUT FEVER  NAUSEA AND VOMITING THAT IS NOT CONTROLLED WITH YOUR NAUSEA MEDICATION  *UNUSUAL SHORTNESS OF BREATH  *UNUSUAL BRUISING OR BLEEDING  TENDERNESS IN MOUTH AND THROAT WITH OR WITHOUT PRESENCE OF ULCERS  *URINARY PROBLEMS  *BOWEL PROBLEMS  UNUSUAL RASH Items with * indicate a potential emergency and should be followed up as soon as possible.  Feel free to call the clinic you have any questions or concerns. The clinic phone number is [phone].  Please show the CHEMO ALERT CARD at check-in to the Emergency Department and triage nurse.   

## 2015-04-24 NOTE — Patient Instructions (Signed)

## 2015-04-30 ENCOUNTER — Ambulatory Visit (HOSPITAL_COMMUNITY)
Admission: RE | Admit: 2015-04-30 | Discharge: 2015-04-30 | Disposition: A | Discharge status: Home / Self Care | Payer: Commercial Managed Care - HMO | Source: Ambulatory Visit | Attending: Hematology and Oncology | Admitting: Hematology and Oncology

## 2015-04-30 ENCOUNTER — Encounter (HOSPITAL_COMMUNITY): Payer: Self-pay

## 2015-04-30 DIAGNOSIS — K760 Fatty (change of) liver, not elsewhere classified: Secondary | ICD-10-CM | POA: Insufficient documentation

## 2015-04-30 DIAGNOSIS — I2510 Atherosclerotic heart disease of native coronary artery without angina pectoris: Secondary | ICD-10-CM | POA: Diagnosis not present

## 2015-04-30 DIAGNOSIS — C3412 Malignant neoplasm of upper lobe, left bronchus or lung: Secondary | ICD-10-CM | POA: Insufficient documentation

## 2015-04-30 DIAGNOSIS — I77811 Abdominal aortic ectasia: Secondary | ICD-10-CM | POA: Insufficient documentation

## 2015-04-30 DIAGNOSIS — I7090 Unspecified atherosclerosis: Secondary | ICD-10-CM | POA: Diagnosis not present

## 2015-04-30 MED ORDER — IOHEXOL 300 MG/ML  SOLN
100.0000 mL | Freq: Once | INTRAMUSCULAR | Status: AC | PRN
  Administered 2015-04-30: 100 mL via INTRAVENOUS

## 2015-04-30 MED ORDER — IOHEXOL 300 MG/ML  SOLN
50.0000 mL | Freq: Once | INTRAMUSCULAR | Status: DC | PRN
  Administered 2015-04-30: 50 mL via ORAL
  Filled 2015-04-30: qty 50

## 2015-05-01 ENCOUNTER — Encounter: Payer: Self-pay | Admitting: Hematology and Oncology

## 2015-05-01 ENCOUNTER — Ambulatory Visit (HOSPITAL_BASED_OUTPATIENT_CLINIC_OR_DEPARTMENT_OTHER): Payer: Commercial Managed Care - HMO | Admitting: Hematology and Oncology

## 2015-05-01 ENCOUNTER — Telehealth: Payer: Self-pay | Admitting: *Deleted

## 2015-05-01 VITALS — BP 133/67 | HR 88 | Temp 98.0°F | Resp 18 | Ht 64.0 in | Wt 163.6 lb

## 2015-05-01 DIAGNOSIS — Z720 Tobacco use: Secondary | ICD-10-CM

## 2015-05-01 DIAGNOSIS — J449 Chronic obstructive pulmonary disease, unspecified: Secondary | ICD-10-CM

## 2015-05-01 DIAGNOSIS — C3412 Malignant neoplasm of upper lobe, left bronchus or lung: Secondary | ICD-10-CM | POA: Diagnosis not present

## 2015-05-01 DIAGNOSIS — D6481 Anemia due to antineoplastic chemotherapy: Secondary | ICD-10-CM

## 2015-05-01 DIAGNOSIS — F17200 Nicotine dependence, unspecified, uncomplicated: Secondary | ICD-10-CM

## 2015-05-01 DIAGNOSIS — T451X5A Adverse effect of antineoplastic and immunosuppressive drugs, initial encounter: Secondary | ICD-10-CM

## 2015-05-01 DIAGNOSIS — J438 Other emphysema: Secondary | ICD-10-CM

## 2015-05-01 MED ORDER — FOLIC ACID 1 MG PO TABS: 1.0000 mg | ORAL_TABLET | Freq: Every day | ORAL | Status: DC

## 2015-05-01 NOTE — Assessment & Plan Note (Signed)
I spent some time counseling the patient the importance of tobacco cessation. She is currently not interested to quit now. 

## 2015-05-01 NOTE — Assessment & Plan Note (Signed)
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 She have responded very well with treatment. However, she developed significant fatigue.  We discussed the risks, benefit, side effects of continuing with treatment. Previously, when I gave her a treatment holiday, her disease relapsed significantly. We also discussed other treatment options including the use of Opdivo/Keytruda  after very prolonged discussion, with any agreement to restart treatment next month but to lengthen her treatment interval to once a month

## 2015-05-01 NOTE — Assessment & Plan Note (Signed)
Anemia is multi-factorial, could be related to anemia of chronic disease & chemotherapy side effects. Iron study in May 2016 is adequate She is not symptomatic. She will continue vitamin B-12 injection every few months and folic acid supplementation.

## 2015-05-01 NOTE — Telephone Encounter (Signed)
Per staff message and POF I have scheduled appts. Advised scheduler of appts. JMW  

## 2015-05-01 NOTE — Progress Notes (Signed)
[facility] OFFICE PROGRESS NOTE  Patient Care Team: [name], MD as PCP - General (Family Medicine) Gaye Pollack, MD (Cardiothoracic Surgery) Thea Silversmith, MD (Radiation Oncology) Dickie La, MD (Family Medicine) Heath Lark, MD as Consulting Physician (Hematology and Oncology) Renella Cunas, MD as Consulting Physician (Cardiology) Greg Cutter, LCSW as [facility] (Licensed Clinical Social Worker) Standley Brooking, RN as [facility]  SUMMARY OF ONCOLOGIC HISTORY: Oncology History   Colon cancer   Primary site: Colon and Rectum (Left)   Staging method: AJCC 7th Edition   Clinical: Stage I (T2, N0, M0) signed by Heath Lark, MD on 05/31/2013  2:39 PM   Pathologic: Stage I (T2, N0, cM0) signed by Heath Lark, MD on 05/31/2013  2:39 PM   Summary: Stage I (T2, N0, cM0) Lung cancer, EGFR/ALK negative, recurrence after initial resection to LN and brain   Primary site: Lung (Left)   Staging method: AJCC 7th Edition   Clinical: Stage IV (T1, N2, M1b) signed by Heath Lark, MD on 05/31/2013  2:26 PM   Pathologic: Stage IV (T1, N2, M1b) signed by Heath Lark, MD on 05/31/2013  2:26 PM   Summary: Stage IV (T1, N2, M1b)       Cancer of upper lobe of left lung, Adenocarcinoma   03/01/2007 Procedure Colonoscopy revealed abnormalities and biopsy show high-grade dysplasia   04/01/2007 Surgery She underwent sigmoid resection which showed T2 N0 colon cancer, and negative margins and all of 17 lymph nodes were negative   02/29/2008 Procedure Repeat surveillance colonoscopy was negative.   06/16/2010 Surgery She underwent left upper lobectomy we show well-differentiated adenocarcinoma of the lung, T1, N0, M0   03/18/2011 Procedure Repeat colonoscopy show multiple polyps but there were benign   10/19/2011 Procedure Biopsy of mediastinal lymph node came back positive for recurrence of lung cancer, EGFR and ALK negative    11/16/2011 - 12/14/2011 Chemotherapy She received concurrent chemoradiation therapy with weekly carboplatin and Taxol.   11/16/2011 - 12/29/2011 Radiation Therapy She received radiation therapy with weekly chemotherapy   02/15/2012 Imaging MR of the brain showed a new intracranial metastases. This was subsequently treated with stereotactic radiosurgery.   03/07/2012 - 04/12/2013 Chemotherapy She received chemotherapy with maintainence Alimta every 3 weeks. Chemotherapy was discontinued due to profound fatigue   03/02/2013 Procedure She had therapeutic ultrasound guidance thoracentesis for pleural effusion that came back negative for cancer   05/04/2013 Procedure She had repeat ultrasound-guided thoracentesis again and cytology was negative   05/29/2013 Imaging Repeat CT scan of the chest, abdomen and pelvis show no evidence of disease but persistent right-sided pleural effusion   06/02/2013 Surgery The patient had placement of Pleurx catheter and subsequently underwent pleurodesis.   09/22/2013 Imaging Repeat CT scan show no evidence of active disease. There are nonspecific lymphadenopathy and she is placed on observation.   03/23/2014 Imaging Repeat CT scan of the chest, abdomen and pelvis show recurrence of cancer with widespread bilateral pulmonary metastasis.   05/14/2014 Imaging Imaging of the chest and brain were repeated due to delay of initiation of chemotherapy. Overall, chest CT scan show stable disease. MRI of the head was negative for recurrence   05/16/2014 - 07/18/2014 Chemotherapy  she completed 4 cycles of combination chemotherapy with carboplatin and Alimta   07/16/2014 Imaging  repeat CT scan of the chest, abdomen and pelvis show regression in the size of lung nodules.   10/16/2014 Imaging  repeat  CT scan of the chest, abdomen and pelvis show regression in the size of lung nodules.   01/29/2015 Imaging Repeat CT scan showed stable disease   02/01/2015 Imaging MRI brain is negative   04/30/2015  Imaging CT scan of the chest, abdomen and pelvis showed stable disease    Brain metastases treated with radiosurgery   02/26/2012 Initial Diagnosis Brain metastases treated with radiosurgery    INTERVAL HISTORY: Please see below for problem oriented charting.  she returns today for further follow-up. She complained of shortness of breath on minimal exertion and occasional cough. No new neurological deficit. She feels tired.  REVIEW OF SYSTEMS:   Constitutional: Denies fevers, chills or abnormal weight loss Eyes: Denies blurriness of vision Ears, nose, mouth, throat, and face: Denies mucositis or sore throat Cardiovascular: Denies palpitation, chest discomfort or lower extremity swelling Gastrointestinal:  Denies nausea, heartburn or change in bowel habits Skin: Denies abnormal skin rashes Lymphatics: Denies new lymphadenopathy or easy bruising Neurological:Denies numbness, tingling or new weaknesses Behavioral/Psych: Mood is stable, no new changes  All other systems were reviewed with the patient and are negative.  I have reviewed the past medical history, past surgical history, social history and family history with the patient and they are unchanged from previous note.  ALLERGIES:  is allergic to adhesive and lisinopril.  MEDICATIONS:  Current Outpatient Prescriptions  Medication Sig Dispense Refill  . albuterol (PROVENTIL HFA;VENTOLIN HFA) 108 (90 BASE) MCG/ACT inhaler Inhale 1 puff into the lungs 2 (two) times daily. 6.7 g prn  . aspirin 81 MG chewable tablet Chew 81 mg by mouth daily.    . diclofenac sodium (VOLTAREN) 1 % GEL Apply 2 g topically 4 (four) times daily. As needed for pain 161 g 11  . folic acid (FOLVITE) 1 MG tablet Take 1 tablet (1 mg total) by mouth daily. 90 tablet 3  . furosemide (LASIX) 20 MG tablet Take 1 tablet (20 mg total) by mouth daily. 90 tablet 3  . lidocaine-prilocaine (EMLA) cream Apply topically as needed. Apply to porta cath site one hour prior  to needle stick. 30 g 6  . mirtazapine (REMERON) 15 MG tablet Take 1 tablet (15 mg total) by mouth at bedtime. 90 tablet 0  . nebivolol (BYSTOLIC) 10 MG tablet Take 1 tablet (10 mg total) by mouth daily. 90 tablet 4  . nitroGLYCERIN (NITROSTAT) 0.4 MG SL tablet Place 1 tablet (0.4 mg total) under the tongue every 5 (five) minutes as needed. For chest pain 25 tablet 5  . omeprazole (PRILOSEC) 40 MG capsule Take 1 capsule (40 mg total) by mouth daily. 90 capsule 3  . PATADAY 0.2 % SOLN     . potassium chloride SA (K-DUR,KLOR-CON) 20 MEQ tablet Take 1 tablet (20 mEq total) by mouth 2 (two) times daily. 180 tablet 0  . pravastatin (PRAVACHOL) 40 MG tablet TAKE 1 TABLET BY MOUTH ONCE DAILY 90 tablet 3  . tiZANidine (ZANAFLEX) 2 MG tablet Take 1 tablet (2 mg total) by mouth every 6 (six) hours as needed (Back Muscle Spasm). 30 tablet 3  . traMADol (ULTRAM) 50 MG tablet Take 1 tablet (50 mg total) by mouth every 6 (six) hours as needed for moderate pain or severe pain. 90 tablet 1  . [DISCONTINUED] buPROPion (WELLBUTRIN XL) 150 MG 24 hr tablet Take 1 tablet (150 mg total) by mouth 2 (two) times daily. 60 tablet 5   No current facility-administered medications for this visit.    PHYSICAL EXAMINATION: ECOG PERFORMANCE STATUS:  1 - Symptomatic but completely ambulatory  Filed Vitals:   05/01/15 1008  BP: 133/67  Pulse: 88  Temp: 98 F (36.7 C)  Resp: 18   Filed Weights   05/01/15 1008  Weight: 163 lb 9.6 oz (74.208 kg)    GENERAL:alert, no distress and comfortable SKIN: skin color, texture, turgor are normal, no rashes or significant lesions EYES: normal, Conjunctiva are pink and non-injected, sclera clear Musculoskeletal:no cyanosis of digits and no clubbing  NEURO: alert & oriented x 3 with fluent speech, no focal motor/sensory deficits  LABORATORY DATA:  I have reviewed the data as listed    Component Value Date/Time   NA 143 04/24/2015 0908   NA 141 12/26/2013 0946   NA 143  09/28/2011 0833   K 3.7 04/24/2015 0908   K 4.0 12/26/2013 0946   K 3.5 09/28/2011 0833   CL 106 12/26/2013 0946   CL 102 10/24/2012 1001   CL 98 09/28/2011 0833   CO2 23 04/24/2015 0908   CO2 27 12/26/2013 0946   CO2 30 09/28/2011 0833   GLUCOSE 95 04/24/2015 0908   GLUCOSE 101* 12/26/2013 0946   GLUCOSE 111* 10/24/2012 1001   GLUCOSE 112 09/28/2011 0833   BUN 8.5 04/24/2015 0908   BUN 9 12/26/2013 0946   BUN 9 09/28/2011 0833   CREATININE 1.2* 04/24/2015 0908   CREATININE 0.83 12/26/2013 0946   CREATININE 0.7 09/28/2011 0833   CALCIUM 9.0 04/24/2015 0908   CALCIUM 8.8 12/26/2013 0946   CALCIUM 8.7 09/28/2011 0833   PROT 7.0 04/24/2015 0908   PROT 6.8 12/26/2013 0946   PROT 7.2 09/28/2011 0833   ALBUMIN 2.7* 04/24/2015 0908   ALBUMIN 3.8 12/26/2013 0946   ALBUMIN 3.2* 09/28/2011 0833   AST 13 04/24/2015 0908   AST 12 12/26/2013 0946   AST 15 09/28/2011 0833   ALT <9 04/24/2015 0908   ALT <8 12/26/2013 0946   ALT 16 09/28/2011 0833   ALKPHOS 106 04/24/2015 0908   ALKPHOS 148* 12/26/2013 0946   ALKPHOS 99* 09/28/2011 0833   BILITOT 0.39 04/24/2015 0908   BILITOT 0.6 12/26/2013 0946   BILITOT 0.80 09/28/2011 0833   GFRNONAA 74* 06/12/2013 0625   GFRAA 86* 06/12/2013 0625    No results found for: SPEP, UPEP  Lab Results  Component Value Date   WBC 4.9 04/24/2015   NEUTROABS 3.4 04/24/2015   HGB 9.9* 04/24/2015   HCT 31.3* 04/24/2015   MCV 84.2 04/24/2015   PLT 395 04/24/2015      Chemistry      Component Value Date/Time   NA 143 04/24/2015 0908   NA 141 12/26/2013 0946   NA 143 09/28/2011 0833   K 3.7 04/24/2015 0908   K 4.0 12/26/2013 0946   K 3.5 09/28/2011 0833   CL 106 12/26/2013 0946   CL 102 10/24/2012 1001   CL 98 09/28/2011 0833   CO2 23 04/24/2015 0908   CO2 27 12/26/2013 0946   CO2 30 09/28/2011 0833   BUN 8.5 04/24/2015 0908   BUN 9 12/26/2013 0946   BUN 9 09/28/2011 0833   CREATININE 1.2* 04/24/2015 0908   CREATININE 0.83 12/26/2013  0946   CREATININE 0.7 09/28/2011 0833      Component Value Date/Time   CALCIUM 9.0 04/24/2015 0908   CALCIUM 8.8 12/26/2013 0946   CALCIUM 8.7 09/28/2011 0833   ALKPHOS 106 04/24/2015 0908   ALKPHOS 148* 12/26/2013 0946   ALKPHOS 99* 09/28/2011 0833   AST 13 04/24/2015  0908   AST 12 12/26/2013 0946   AST 15 09/28/2011 0833   ALT <9 04/24/2015 0908   ALT <8 12/26/2013 0946   ALT 16 09/28/2011 0833   BILITOT 0.39 04/24/2015 0908   BILITOT 0.6 12/26/2013 0946   BILITOT 0.80 09/28/2011 0833       RADIOGRAPHIC STUDIES: I have personally reviewed the radiological images as listed and agreed with the findings in the report. Ct Chest W Contrast  04/30/2015  CLINICAL DATA:  64 year old female with history of left upper lobe lung cancer diagnosed in 2012, with recurrence in 2013 in 2015. Restaging examination. Additional history of colon cancer diagnosed 2008. Chemotherapy in progress. EXAM: CT CHEST, ABDOMEN, AND PELVIS WITH CONTRAST TECHNIQUE: Multidetector CT imaging of the chest, abdomen and pelvis was performed following the standard protocol during bolus administration of intravenous contrast. CONTRAST:  66m OMNIPAQUE IOHEXOL 300 MG/ML SOLN, 1039mOMNIPAQUE IOHEXOL 300 MG/ML SOLN COMPARISON:  CT of the chest, abdomen and pelvis 01/29/2015. FINDINGS: CT CHEST FINDINGS Mediastinum/Nodes: Heart size is normal. There is no significant pericardial fluid, thickening or pericardial calcification. There is atherosclerosis of the thoracic aorta, the great vessels of the mediastinum and the coronary arteries, including calcified atherosclerotic plaque in the left main, left anterior descending, left circumflex and right coronary arteries. Prominent soft tissue in the subcarinal station measuring up to 9 mm is chronic and similar to numerous prior examinations. Soft tissue fullness and around the right hilar region also appears similar to prior examinations. No definite new mediastinal or hilar  lymphadenopathy identified. Well-defined 1.4 x 1.8 cm low-attenuation nodule in the left lobe of the thyroid gland is unchanged and nonspecific. Esophagus is normal in appearance. No axillary lymphadenopathy. Right internal jugular single-lumen porta cath with tip terminating in the right atrium. Lungs/Pleura: Status post left upper lobectomy. Compensatory hyperexpansion of the left lower lobe. Chronic volume loss and architectural distortion in the posteromedial aspect of of the upper right hemithorax and perihilar region of the right lung, favored to reflect chronic postradiation changes. Focal architectural distortion in the periphery of the left lower lobe anteriorly, similar prior studies, most compatible with chronic postoperative scarring. There continues to be a pattern of septal thickening and peribronchovascular micronodularity throughout the right lung which is similar to the prior study 01/29/2015, and appears slightly improved compared to more remote prior examination 02/05/2015. Chronic pleural thickening and calcification in the lower right hemithorax, potentially from prior talc pleurodesis. Musculoskeletal: There are no aggressive appearing lytic or blastic lesions noted in the visualized portions of the skeleton. CT ABDOMEN PELVIS FINDINGS Hepatobiliary: Diffuse low attenuation throughout the hepatic parenchyma, compatible with a background of hepatic steatosis. Sub cm low-attenuation lesions in the liver are similar in size, number and distribution to prior examinations, and although too small to definitively characterize are favored to represent tiny cysts or other benign lesions. No new suspicious appearing hepatic lesions are noted. No intra or extrahepatic biliary ductal dilatation. Gallbladder is normal in appearance. Pancreas: No pancreatic mass. No pancreatic ductal dilatation. No pancreatic or peripancreatic fluid or inflammatory changes. Spleen: Unremarkable. Adrenals/Urinary Tract: Mild  bilateral adreniform thickening of the adrenal glands, similar to prior studies, presumably mild adrenal hyperplasia. Bilateral kidneys are normal in appearance. No hydroureteronephrosis. Urinary bladder is normal in appearance. Stomach/Bowel: Stomach is normal in appearance. No pathologic dilatation of small bowel or colon. Normal appendix. Vascular/Lymphatic: Extensive atherosclerosis with multifocal fusiform ectasia of the infrarenal abdominal aorta which measures up to 2.8 x 2.6 cm. No lymphadenopathy noted  in the abdomen or pelvis. Reproductive: Status post hysterectomy. Ovaries are unremarkable in appearance. Other: Small supraumbilical ventral hernia containing omental fat. A portion of the transverse colon extends toward the neck of the hernia, without extending into the hernia on today's examination. No significant volume of ascites. No pneumoperitoneum. Musculoskeletal: There are no aggressive appearing lytic or blastic lesions noted in the visualized portions of the skeleton. IMPRESSION: 1. Stable posttreatment related changes in the thorax redemonstrated, without evidence to suggest local recurrence of disease or definite metastatic disease in the lungs. 2. Chronic peribronchovascular micronodularity throughout the right lung, similar to recent prior examinations but improved compared to more remote prior examinations, favored to reflect areas of chronic mucoid impaction within terminal bronchioles. 3. Hepatic steatosis. 4. Extensive atherosclerosis, including left main and 3 vessel coronary artery disease as well as several areas of fusiform ectasia of the infrarenal abdominal aorta, similar to prior examinations. 5. Additional incidental findings, similar prior studies, as above. Electronically Signed   By: Vinnie Langton M.D.   On: 04/30/2015 14:18   Ct Abdomen Pelvis W Contrast  04/30/2015  CLINICAL DATA:  64 year old female with history of left upper lobe lung cancer diagnosed in 2012, with  recurrence in 2013 in 2015. Restaging examination. Additional history of colon cancer diagnosed 2008. Chemotherapy in progress. EXAM: CT CHEST, ABDOMEN, AND PELVIS WITH CONTRAST TECHNIQUE: Multidetector CT imaging of the chest, abdomen and pelvis was performed following the standard protocol during bolus administration of intravenous contrast. CONTRAST:  28m OMNIPAQUE IOHEXOL 300 MG/ML SOLN, 105mOMNIPAQUE IOHEXOL 300 MG/ML SOLN COMPARISON:  CT of the chest, abdomen and pelvis 01/29/2015. FINDINGS: CT CHEST FINDINGS Mediastinum/Nodes: Heart size is normal. There is no significant pericardial fluid, thickening or pericardial calcification. There is atherosclerosis of the thoracic aorta, the great vessels of the mediastinum and the coronary arteries, including calcified atherosclerotic plaque in the left main, left anterior descending, left circumflex and right coronary arteries. Prominent soft tissue in the subcarinal station measuring up to 9 mm is chronic and similar to numerous prior examinations. Soft tissue fullness and around the right hilar region also appears similar to prior examinations. No definite new mediastinal or hilar lymphadenopathy identified. Well-defined 1.4 x 1.8 cm low-attenuation nodule in the left lobe of the thyroid gland is unchanged and nonspecific. Esophagus is normal in appearance. No axillary lymphadenopathy. Right internal jugular single-lumen porta cath with tip terminating in the right atrium. Lungs/Pleura: Status post left upper lobectomy. Compensatory hyperexpansion of the left lower lobe. Chronic volume loss and architectural distortion in the posteromedial aspect of of the upper right hemithorax and perihilar region of the right lung, favored to reflect chronic postradiation changes. Focal architectural distortion in the periphery of the left lower lobe anteriorly, similar prior studies, most compatible with chronic postoperative scarring. There continues to be a pattern of  septal thickening and peribronchovascular micronodularity throughout the right lung which is similar to the prior study 01/29/2015, and appears slightly improved compared to more remote prior examination 02/05/2015. Chronic pleural thickening and calcification in the lower right hemithorax, potentially from prior talc pleurodesis. Musculoskeletal: There are no aggressive appearing lytic or blastic lesions noted in the visualized portions of the skeleton. CT ABDOMEN PELVIS FINDINGS Hepatobiliary: Diffuse low attenuation throughout the hepatic parenchyma, compatible with a background of hepatic steatosis. Sub cm low-attenuation lesions in the liver are similar in size, number and distribution to prior examinations, and although too small to definitively characterize are favored to represent tiny cysts or other benign lesions.  No new suspicious appearing hepatic lesions are noted. No intra or extrahepatic biliary ductal dilatation. Gallbladder is normal in appearance. Pancreas: No pancreatic mass. No pancreatic ductal dilatation. No pancreatic or peripancreatic fluid or inflammatory changes. Spleen: Unremarkable. Adrenals/Urinary Tract: Mild bilateral adreniform thickening of the adrenal glands, similar to prior studies, presumably mild adrenal hyperplasia. Bilateral kidneys are normal in appearance. No hydroureteronephrosis. Urinary bladder is normal in appearance. Stomach/Bowel: Stomach is normal in appearance. No pathologic dilatation of small bowel or colon. Normal appendix. Vascular/Lymphatic: Extensive atherosclerosis with multifocal fusiform ectasia of the infrarenal abdominal aorta which measures up to 2.8 x 2.6 cm. No lymphadenopathy noted in the abdomen or pelvis. Reproductive: Status post hysterectomy. Ovaries are unremarkable in appearance. Other: Small supraumbilical ventral hernia containing omental fat. A portion of the transverse colon extends toward the neck of the hernia, without extending into the  hernia on today's examination. No significant volume of ascites. No pneumoperitoneum. Musculoskeletal: There are no aggressive appearing lytic or blastic lesions noted in the visualized portions of the skeleton. IMPRESSION: 1. Stable posttreatment related changes in the thorax redemonstrated, without evidence to suggest local recurrence of disease or definite metastatic disease in the lungs. 2. Chronic peribronchovascular micronodularity throughout the right lung, similar to recent prior examinations but improved compared to more remote prior examinations, favored to reflect areas of chronic mucoid impaction within terminal bronchioles. 3. Hepatic steatosis. 4. Extensive atherosclerosis, including left main and 3 vessel coronary artery disease as well as several areas of fusiform ectasia of the infrarenal abdominal aorta, similar to prior examinations. 5. Additional incidental findings, similar prior studies, as above. Electronically Signed   By: Vinnie Langton M.D.   On: 04/30/2015 14:18     ASSESSMENT & PLAN:  Cancer of upper lobe of left lung, Adenocarcinoma  Remarkably, her CT scan showed no evidence of active disease. She have responded very well with treatment. However, she developed significant fatigue.  We discussed the risks, benefit, side effects of continuing with treatment. Previously, when I gave her a treatment holiday, her disease relapsed significantly. We also discussed other treatment options including the use of Opdivo/Keytruda  after very prolonged discussion, with any agreement to restart treatment next month but to lengthen her treatment interval to once a month  TOBACCO ABUSE I spent some time counseling the patient the importance of tobacco cessation. She is currently not interested to quit now.    Anemia due to chemotherapy Anemia is multi-factorial, could be related to anemia of chronic disease & chemotherapy side effects. Iron study in May 2016 is adequate She is not  symptomatic. She will continue vitamin B-12 injection every few months and folic acid supplementation.   COPD (chronic obstructive pulmonary disease) ([place])  She has shortness of breath on minimal exertion. I suspect she have active COPD and element of deconditioning. Again, I recommend she quit smoking and exercise program as tolerated.   No orders of the defined types were placed in this encounter.   All questions were answered. The patient knows to call the clinic with any problems, questions or concerns. No barriers to learning was detected. I spent 25 minutes counseling the patient face to face. The total time spent in the appointment was 40 minutes and more than 50% was on counseling and review of test results     [facility], [place], MD 05/01/2015 10:50 AM

## 2015-05-01 NOTE — Assessment & Plan Note (Signed)
She has shortness of breath on minimal exertion. I suspect she have active COPD and element of deconditioning. Again, I recommend she quit smoking and exercise program as tolerated.

## 2015-05-02 ENCOUNTER — Other Ambulatory Visit: Payer: Self-pay | Admitting: Obstetrics and Gynecology

## 2015-05-10 ENCOUNTER — Other Ambulatory Visit: Payer: Self-pay

## 2015-05-10 ENCOUNTER — Other Ambulatory Visit: Payer: Self-pay | Admitting: *Deleted

## 2015-05-10 DIAGNOSIS — J441 Chronic obstructive pulmonary disease with (acute) exacerbation: Secondary | ICD-10-CM

## 2015-05-10 NOTE — Patient Outreach (Signed)
[name]) Care Management  05/10/2015  Lauren [name] 16-Apr-1951 [id number]   RN Health Coach telephone call to patient.  [employer] compliance verified. RN Health Coach  introduced self to patient. Patient was very agreeable to follow up out reach calls. Patient was referred by [facility].   Plan: RN Health Coach will do follow up call within a month for assessment. Patient agreed to follow up call  [facility] [phone]

## 2015-05-10 NOTE — Patient Outreach (Signed)
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 Patient with reoccurrence twice in lungs and has been instructed she will need to have chemo every 3 weeks for the rest of her life or until her body is unable to handle it. Patient was with a Lauren [name] in the past that offered her a great deal of help and support.  InsuranceJosephine [name] [facility] [id number]  Providers:  PCP:Dr. Dr. McDiarmid - last appt 12/24/14; [facility]  Oncologist: Dr. Heath Lark (lung)- Last appt 04/2016  Social: Widowed for more than 10 years and living in [place]. Patient has 3 adult children: 1 daughter Lauren [name]) and two sons; (one living in [facility] [place] with Huntington's Disease and another son in [place], [place] with possible Huntington's Disease. Falls: 0. Patient is independent with ADLs. Walks with a cane as needed only.  Transportation: owns a car and still driving.  Caregiver and Emergency contact: Daughter, Lauren [name], [phone] Financial Concerns: H/o concerns addressed with [facility] SW this episode. (please see SW notes for further details).  DME: [name], Talking scale provided by patients brother who worked for [employer] for Health, Ankle support sleeves. Advance Directive Yes, LW and HCPOA. H/O has a friend who has prepared her a requested CD with old school gospel music to be used at her funeral. 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 H/O reoccurrence twice in lungs. Patient states most recent CT scan improved and Chemo will decrease to 1 time a month starting in  January 2017.  Radiation - none since brain cancer. H/o swelling in feet and ankles associated to lymph node removal during colon cancer surgery. Patient uses ankle sleeve for support.   HBP Last MD office BP reading: 137/67 Weight:164 Ht 5'4" H/o Patient reported H/O having a heart blockage but unable to give specific details.  Cardiologist: [place] - Last appt 2016 next appt - none scheduled. H/O Patient states she thinks she goes to see the Cardiologist too much and thinks once a year is enough and cost of co-pays interring with more regular appt's.  Pepsi (Regular) 3-4 a day with no improvement in comsumption.   COPD (2008) Pulmonologist:  NONE; Managed by Primary MD. H/O patient did see a Pulmonologist with the [facility] a long time ago but due to cost of Specialist co-pays; prefers to let her Primary MD manage. States no unmanaged symptoms at this time and using medications as ordered.  Smoker:  < 1 pack per day. Patient has been provided education from both MD office and [facility] but patient states she is not ready to stop smoking.  States she manages stress by smoking.   Educational COPD Blue Folder sent 11/28/14. (Reviewed 12/31/14). Emmi Education sent 12/31/14 (Reviewed 01/28/15).   Blepharitis of both eyes Baby shampoo cleaning of eyelids twice a day Pataday ophthalmic soln 1 drop each eye daily for allergic component. . Patient states some improvement and MD advised to continue with treatment; patient reports she still has some blurred vision on occasion and will discuss with MD on next MD appt.  Patient confirms last eye appt completed about one year ago (2015).  Preventives: Flu Vaccine: 12/17/14 Pneumonia Vaccine: 2015 Shingles Vaccine: yes Mammogram 08/28/2014 Pap: Last pap (2015) and was advised no future pap's required. H/O hysterectomy years ago but patient does not know if ovaries were removed. H/O patient refused pap on 12/24/14 PCP visit.  Bone  density: unknown - patient does not recall ever getting a bone density. (Per Epic indicates completed but no date documented).  Colonoscopy: 2015   Consent Patient gave verbal consent for [facility] Telephonic and [employer] as needed and [place] services.  Written [facility] consent on file: see media tab.   Plan: Patient unsuccessful with goals of smoking cessation and reducing Pepsi intake.  Patient is not receptive to these changes at this time.   Patient reports no new acute COPD exacerbations and improvement in oncology/cancer condition.   Referral back from community to telephonic services 01/28/2015 CM start date:  01/14/2015 - 05/10/2015 Program:  COPD 01/28/2015 - 05/10/2015 Health Coach Referral 05/10/2015 Case closed to Telephonic RN CM services 05/10/2015.   RN CM advised patient goals of care and care coordination services have been met.  RN CM instructed in next [facility] phone follow-up call within one month for [employer].  RN CM advised to please notify MD of any changes in her condition prior to scheduled appt's.  RN CM provided contact name and #, 24-hour nurse line # [phone].  RN CM confirmed patient is aware of 911 services for urgent emergency needs.  [name], RN, BSN, [facility], CCM  [employer] Management Coordinator [phone] Direct [phone] Cell [phone] Office [phone] Fax

## 2015-05-17 ENCOUNTER — Telehealth: Payer: Self-pay | Admitting: *Deleted

## 2015-05-17 NOTE — Telephone Encounter (Signed)
VM message received @ 3:35 pm from patient stating she is feeling dizzy.  TC returned to patient. Patient states she ate ribs and beans from [place] on Wednesday and was sick for the next 2 days with vomiting and feeling very dizzy. Pt did not contact us during those 2 days. She states she is no longer vomiting and is drinking fluids (water) well.  No fever or diarrhea.  She also states that her neighbor checked her blood pressure yesterday and it was elevated with systolic # @ 967.  Later in the day it was down to 170. Denies blurred vision, headache, numbness etc. Pt was not able to take any of her medicines including BP meds until today.  Advised her to get BP checked again, continue her meds and fluids.  Advised pt to go to ED if BP becomes elevated again or has any neurological changes. Advised her to eat BRAT type diet next few days until she feels she has recovered.  Pt voiced understanding and will call back if symptoms worsen.

## 2015-05-22 ENCOUNTER — Other Ambulatory Visit: Payer: Self-pay | Admitting: *Deleted

## 2015-05-22 ENCOUNTER — Ambulatory Visit (HOSPITAL_BASED_OUTPATIENT_CLINIC_OR_DEPARTMENT_OTHER): Payer: Commercial Managed Care - HMO

## 2015-05-22 ENCOUNTER — Other Ambulatory Visit (HOSPITAL_BASED_OUTPATIENT_CLINIC_OR_DEPARTMENT_OTHER): Payer: Commercial Managed Care - HMO

## 2015-05-22 ENCOUNTER — Telehealth: Payer: Self-pay | Admitting: *Deleted

## 2015-05-22 ENCOUNTER — Ambulatory Visit: Payer: Commercial Managed Care - HMO

## 2015-05-22 VITALS — BP 159/74 | HR 74 | Temp 98.2°F | Resp 18

## 2015-05-22 DIAGNOSIS — C3412 Malignant neoplasm of upper lobe, left bronchus or lung: Secondary | ICD-10-CM

## 2015-05-22 DIAGNOSIS — Z5111 Encounter for antineoplastic chemotherapy: Secondary | ICD-10-CM

## 2015-05-22 DIAGNOSIS — C7949 Secondary malignant neoplasm of other parts of nervous system: Principal | ICD-10-CM

## 2015-05-22 DIAGNOSIS — C7931 Secondary malignant neoplasm of brain: Secondary | ICD-10-CM

## 2015-05-22 DIAGNOSIS — Z95828 Presence of other vascular implants and grafts: Secondary | ICD-10-CM

## 2015-05-22 LAB — COMPREHENSIVE METABOLIC PANEL
ALBUMIN: 3 g/dL — AB (ref 3.5–5.0)
ALK PHOS: 115 U/L (ref 40–150)
ALT: 9 U/L (ref 0–55)
AST: 16 U/L (ref 5–34)
Anion Gap: 8 mEq/L (ref 3–11)
BILIRUBIN TOTAL: 0.4 mg/dL (ref 0.20–1.20)
BUN: 13 mg/dL (ref 7.0–26.0)
CALCIUM: 8.8 mg/dL (ref 8.4–10.4)
CO2: 26 mEq/L (ref 22–29)
CREATININE: 1.2 mg/dL — AB (ref 0.6–1.1)
Chloride: 107 mEq/L (ref 98–109)
EGFR: 53 mL/min/{1.73_m2} — ABNORMAL LOW (ref >90)
GLUCOSE: 95 mg/dL (ref 70–140)
POTASSIUM: 3.6 meq/L (ref 3.5–5.1)
SODIUM: 141 meq/L (ref 136–145)
TOTAL PROTEIN: 7 g/dL (ref 6.4–8.3)

## 2015-05-22 LAB — CBC WITH DIFFERENTIAL/PLATELET
BASO%: 1 % (ref 0.0–2.0)
BASOS ABS: 0.1 10*3/uL (ref 0.0–0.1)
EOS%: 1.8 % (ref 0.0–7.0)
Eosinophils Absolute: 0.1 10*3/uL (ref 0.0–0.5)
HEMATOCRIT: 33.8 % — AB (ref 34.8–46.6)
HEMOGLOBIN: 10.7 g/dL — AB (ref 11.6–15.9)
LYMPH#: 0.9 10*3/uL (ref 0.9–3.3)
LYMPH%: 17.3 % (ref 14.0–49.7)
MCH: 26.9 pg (ref 25.1–34.0)
MCHC: 31.7 g/dL (ref 31.5–36.0)
MCV: 85 fL (ref 79.5–101.0)
MONO#: 0.7 10*3/uL (ref 0.1–0.9)
MONO%: 13.6 % (ref 0.0–14.0)
NEUT%: 66.3 % (ref 38.4–76.8)
NEUTROS ABS: 3.4 10*3/uL (ref 1.5–6.5)
Platelets: 226 10*3/uL (ref 145–400)
RBC: 3.98 10*6/uL (ref 3.70–5.45)
RDW: 20.3 % — AB (ref 11.2–14.5)
WBC: 5.2 10*3/uL (ref 3.9–10.3)

## 2015-05-22 MED ORDER — SODIUM CHLORIDE 0.9 % IV SOLN
Freq: Once | INTRAVENOUS | Status: AC
  Administered 2015-05-22: 14:00:00 via INTRAVENOUS
  Filled 2015-05-22: qty 4

## 2015-05-22 MED ORDER — SODIUM CHLORIDE 0.9 % IJ SOLN
10.0000 mL | INTRAMUSCULAR | Status: DC | PRN
  Administered 2015-05-22: 10 mL via INTRAVENOUS
  Filled 2015-05-22: qty 10

## 2015-05-22 MED ORDER — HEPARIN SOD (PORK) LOCK FLUSH 100 UNIT/ML IV SOLN
500.0000 [IU] | Freq: Once | INTRAVENOUS | Status: AC | PRN
  Administered 2015-05-22: 500 [IU]
  Filled 2015-05-22: qty 5

## 2015-05-22 MED ORDER — SODIUM CHLORIDE 0.9 % IV SOLN
485.0000 mg/m2 | Freq: Once | INTRAVENOUS | Status: AC
  Administered 2015-05-22: 900 mg via INTRAVENOUS
  Filled 2015-05-22: qty 36

## 2015-05-22 MED ORDER — SODIUM CHLORIDE 0.9 % IV SOLN
Freq: Once | INTRAVENOUS | Status: AC
  Administered 2015-05-22: 14:00:00 via INTRAVENOUS

## 2015-05-22 MED ORDER — SODIUM CHLORIDE 0.9 % IJ SOLN
10.0000 mL | INTRAMUSCULAR | Status: DC | PRN
  Administered 2015-05-22: 10 mL
  Filled 2015-05-22: qty 10

## 2015-05-22 NOTE — Patient Instructions (Signed)

## 2015-05-22 NOTE — Patient Instructions (Signed)
[facility] Discharge Instructions for Patients Receiving Chemotherapy  Today you received the following chemotherapy agents; Alimta.   To help prevent nausea and vomiting after your treatment, we encourage you to take your nausea medication as directed.    If you develop nausea and vomiting that is not controlled by your nausea medication, call the clinic.   BELOW ARE SYMPTOMS THAT SHOULD BE REPORTED IMMEDIATELY:  *FEVER GREATER THAN 100.5 F  *CHILLS WITH OR WITHOUT FEVER  NAUSEA AND VOMITING THAT IS NOT CONTROLLED WITH YOUR NAUSEA MEDICATION  *UNUSUAL SHORTNESS OF BREATH  *UNUSUAL BRUISING OR BLEEDING  TENDERNESS IN MOUTH AND THROAT WITH OR WITHOUT PRESENCE OF ULCERS  *URINARY PROBLEMS  *BOWEL PROBLEMS  UNUSUAL RASH Items with * indicate a potential emergency and should be followed up as soon as possible.  Feel free to call the clinic you have any questions or concerns. The clinic phone number is [phone].  Please show the CHEMO ALERT CARD at check-in to the Emergency Department and triage nurse.   

## 2015-05-22 NOTE — Telephone Encounter (Signed)
1.  Do you need a wheel chair?  no  2. On oxygen? NO  3. Have you ever had any surgery in the body part being scanned?  NO  4. Have you ever had any surgery on your brain or heart?     NO                  5. Have you ever had surgery on your eyes or ears?     NO                         6. Do you have a pacemaker or defibrillator? NO   7. Do you have a Neurostimulator?  NO  8. Claustrophobic?  NO  9. Any risk for metal in eyes? NO  10. Injury by bullet, buckshot, or shrapnel?  NO  11. Stent?  NO                                                                                                                 12. Hx of Cancer?   13. Kidney or Liver disease?  NO  14. Hx of Lupus, Rheumatoid Arthritis or Scleroderma? NO  15. IV Antibiotics or long term use of NSAIDS?     58. HX of Hypertension?  YES  17. Diabetes?  NO  18. Allergy to contrast?  NO  19. Recent labs.

## 2015-06-04 ENCOUNTER — Encounter: Payer: Self-pay | Admitting: *Deleted

## 2015-06-04 ENCOUNTER — Other Ambulatory Visit: Payer: Self-pay | Admitting: *Deleted

## 2015-06-04 ENCOUNTER — Ambulatory Visit: Payer: Self-pay | Admitting: *Deleted

## 2015-06-04 NOTE — Patient Outreach (Signed)
[place] [facility]) [place] [facility]) Care Management  [place]  06/04/2015   Lauren [name] Mar 27, 1951 [id number]  Subjective: RN Health Coach telephone call to patient.  [employer] compliance verified. Per patient she is doing  good. Patient has history  of ca of the lt upper lobe of lung. Per patient she has to write things down because she forgets sometimes. Per patient she is having memory issues and have mentioned it to her daughter.   Patient family is very supportive her daughter and sister.  Patient stated she is a DNR and has a living will and wants to be cremated and placed in her daughter guest room with the cremated cats. Patient is having chromic pain in all the joints. Patient does wear dentures but the lower ones are broken and in need of repair. Per patient she does have a cane but doesn't feel she needs it at this time. Patient did state that she has had back surgery and has some numbness in the left side and thigh. Patient does not remember the Copd Zones or action plan. Patient stated she gets her BP taken at the Dr office. Per patient she does weigh herself. Her weight today was 163. Patient has agreed to follow up outreach call.   Objective:   Current Medications:  Current Outpatient Prescriptions  Medication Sig Dispense Refill  . albuterol (PROVENTIL HFA;VENTOLIN HFA) 108 (90 BASE) MCG/ACT inhaler Inhale 1 puff into the lungs 2 (two) times daily. 6.7 g prn  . aspirin 81 MG chewable tablet Chew 81 mg by mouth daily.    . diclofenac sodium (VOLTAREN) 1 % GEL Apply 2 g topically 4 (four) times daily. As needed for pain 607 g 11  . folic acid (FOLVITE) 1 MG tablet Take 1 tablet (1 mg total) by mouth daily. 90 tablet 3  . furosemide (LASIX) 20 MG tablet Take 1 tablet (20 mg total) by mouth daily. 90 tablet 3  . lidocaine-prilocaine (EMLA) cream Apply topically as needed. Apply to porta cath site one hour prior to needle stick.  30 g 6  . mirtazapine (REMERON) 15 MG tablet Take 1 tablet (15 mg total) by mouth at bedtime. 90 tablet 0  . nebivolol (BYSTOLIC) 10 MG tablet Take 1 tablet (10 mg total) by mouth daily. 90 tablet 4  . nitroGLYCERIN (NITROSTAT) 0.4 MG SL tablet Place 1 tablet (0.4 mg total) under the tongue every 5 (five) minutes as needed. For chest pain 25 tablet 5  . omeprazole (PRILOSEC) 40 MG capsule Take 1 capsule (40 mg total) by mouth daily. 90 capsule 3  . PATADAY 0.2 % SOLN     . potassium chloride SA (K-DUR,KLOR-CON) 20 MEQ tablet TAKE 1 TABLET (20 MEQ TOTAL) BY MOUTH 2 (TWO) TIMES DAILY. 180 tablet 0  . pravastatin (PRAVACHOL) 40 MG tablet TAKE 1 TABLET BY MOUTH ONCE DAILY 90 tablet 3  . tiZANidine (ZANAFLEX) 2 MG tablet Take 1 tablet (2 mg total) by mouth every 6 (six) hours as needed (Back Muscle Spasm). 30 tablet 3  . traMADol (ULTRAM) 50 MG tablet Take 1 tablet (50 mg total) by mouth every 6 (six) hours as needed for moderate pain or severe pain. 90 tablet 1  . [DISCONTINUED] buPROPion (WELLBUTRIN XL) 150 MG 24 hr tablet Take 1 tablet (150 mg total) by mouth 2 (two) times daily. 60 tablet 5   No current facility-administered medications for this visit.    Functional Status:  In your  present state of health, do you have any difficulty performing the following activities: 06/04/2015 06/04/2015  Hearing? N N  Vision? Y -  Difficulty concentrating or making decisions? N -  Walking or climbing stairs? N -  Dressing or bathing? N -  Doing errands, shopping? N -  Conservation officer, nature and eating ? - N  Using the Toilet? - N  In the past six months, have you accidently leaked urine? - N  Do you have problems with loss of bowel control? - N  Managing your Medications? - N  Managing your Finances? - N  Housekeeping or managing your Housekeeping? - N    Fall/Depression Screening: PHQ 2/9 Scores 06/04/2015 06/04/2015 02/08/2015 01/28/2015 12/31/2014 12/24/2014 12/20/2014  PHQ - 2 Score 0 0 1 0 0 0 0     Assessment:  Patient will benefit from [place] telephonic outreach for education and support for copd self management  Plan: [employer] CM Care Plan Problem One        Most Recent Value   Care Plan Problem One  Knowledge deficit in self management of COPD   Role Documenting the Problem One  Health [place] for Problem One  Active   THN Long Term Goal (31-90 days)  Patient will not have any readmissions of COPD witin the next 90 days   THN Long Term Goal Start Date  06/04/15   Interventions for Problem One Long Term Goal  RN discussed with patient the importance of keeping appointment with pulmonologist and PCP. RN discussed with patient the importance of medication adherence. RN discussed with patient the zones and action plan of COPD   THN CM Short Term Goal #1 (0-30 days)  Patient will be able to verbalize the zones and action plan of COPD within 30 days   THN CM Short Term Goal #1 Start Date  06/04/15   Interventions for Short Term Goal #1  RN Health Coach will send patient a large refrigerator magnet as a reminder of zones and action plan. RN will send EMMI education material on COPD when to get help.RN will have a follow up outreach discussion with patient and use teachback   THN CM Short Term Goal #2 (0-30 days)  Patient will verbalizet how to utilize her inhalers the correct way within 30 days   THN CM Short Term Goal #2 Start Date  06/04/15   Interventions for Short Term Goal #2  RN will send patient EMMI information on utilizing inhalers correctly. RN Health Coach will follow up with patient within a month.    RN Health Coach will contact patient within a month. Patient agreed to follow up outreach call. [place] Care Management [phone]

## 2015-06-05 ENCOUNTER — Encounter: Payer: Self-pay | Admitting: *Deleted

## 2015-06-06 ENCOUNTER — Encounter: Payer: Self-pay | Admitting: Gastroenterology

## 2015-06-13 MED FILL — FOLIC ACID 1 MG TABLET: 1 | 90 days supply | Qty: 90 | Fill #2

## 2015-06-19 ENCOUNTER — Other Ambulatory Visit (HOSPITAL_BASED_OUTPATIENT_CLINIC_OR_DEPARTMENT_OTHER): Payer: Commercial Managed Care - HMO

## 2015-06-19 ENCOUNTER — Ambulatory Visit (HOSPITAL_BASED_OUTPATIENT_CLINIC_OR_DEPARTMENT_OTHER): Payer: Commercial Managed Care - HMO

## 2015-06-19 ENCOUNTER — Telehealth: Payer: Self-pay | Admitting: *Deleted

## 2015-06-19 ENCOUNTER — Telehealth: Payer: Self-pay | Admitting: Hematology and Oncology

## 2015-06-19 ENCOUNTER — Ambulatory Visit: Payer: Commercial Managed Care - HMO

## 2015-06-19 ENCOUNTER — Ambulatory Visit (HOSPITAL_BASED_OUTPATIENT_CLINIC_OR_DEPARTMENT_OTHER): Payer: Commercial Managed Care - HMO | Admitting: Hematology and Oncology

## 2015-06-19 ENCOUNTER — Encounter: Payer: Self-pay | Admitting: Hematology and Oncology

## 2015-06-19 VITALS — BP 155/71 | HR 81 | Temp 98.0°F | Resp 17 | Ht 64.0 in | Wt 164.1 lb

## 2015-06-19 DIAGNOSIS — F17200 Nicotine dependence, unspecified, uncomplicated: Secondary | ICD-10-CM

## 2015-06-19 DIAGNOSIS — R42 Dizziness and giddiness: Secondary | ICD-10-CM | POA: Insufficient documentation

## 2015-06-19 DIAGNOSIS — C7931 Secondary malignant neoplasm of brain: Secondary | ICD-10-CM

## 2015-06-19 DIAGNOSIS — C7949 Secondary malignant neoplasm of other parts of nervous system: Secondary | ICD-10-CM

## 2015-06-19 DIAGNOSIS — C3412 Malignant neoplasm of upper lobe, left bronchus or lung: Secondary | ICD-10-CM | POA: Diagnosis not present

## 2015-06-19 DIAGNOSIS — Z720 Tobacco use: Secondary | ICD-10-CM

## 2015-06-19 DIAGNOSIS — D6481 Anemia due to antineoplastic chemotherapy: Secondary | ICD-10-CM

## 2015-06-19 DIAGNOSIS — Z5111 Encounter for antineoplastic chemotherapy: Secondary | ICD-10-CM

## 2015-06-19 DIAGNOSIS — I10 Essential (primary) hypertension: Secondary | ICD-10-CM | POA: Diagnosis not present

## 2015-06-19 DIAGNOSIS — T451X5A Adverse effect of antineoplastic and immunosuppressive drugs, initial encounter: Secondary | ICD-10-CM

## 2015-06-19 DIAGNOSIS — Z95828 Presence of other vascular implants and grafts: Secondary | ICD-10-CM

## 2015-06-19 HISTORY — DX: Dizziness and giddiness: R42

## 2015-06-19 LAB — COMPREHENSIVE METABOLIC PANEL
ALBUMIN: 2.9 g/dL — AB (ref 3.5–5.0)
ALK PHOS: 116 U/L (ref 40–150)
ALT: 11 U/L (ref 0–55)
AST: 14 U/L (ref 5–34)
Anion Gap: 10 mEq/L (ref 3–11)
BILIRUBIN TOTAL: 0.32 mg/dL (ref 0.20–1.20)
BUN: 11.6 mg/dL (ref 7.0–26.0)
CO2: 25 mEq/L (ref 22–29)
CREATININE: 1.2 mg/dL — AB (ref 0.6–1.1)
Calcium: 9.1 mg/dL (ref 8.4–10.4)
Chloride: 107 mEq/L (ref 98–109)
EGFR: 53 mL/min/{1.73_m2} — AB (ref >90)
GLUCOSE: 94 mg/dL (ref 70–140)
Potassium: 3.7 mEq/L (ref 3.5–5.1)
SODIUM: 142 meq/L (ref 136–145)
TOTAL PROTEIN: 7.1 g/dL (ref 6.4–8.3)

## 2015-06-19 LAB — CBC WITH DIFFERENTIAL/PLATELET
BASO%: 0.9 % (ref 0.0–2.0)
Basophils Absolute: 0.1 10*3/uL (ref 0.0–0.1)
EOS ABS: 0.1 10*3/uL (ref 0.0–0.5)
EOS%: 1.2 % (ref 0.0–7.0)
HCT: 36.2 % (ref 34.8–46.6)
HEMOGLOBIN: 11.3 g/dL — AB (ref 11.6–15.9)
LYMPH%: 14.1 % (ref 14.0–49.7)
MCH: 26.7 pg (ref 25.1–34.0)
MCHC: 31.1 g/dL — ABNORMAL LOW (ref 31.5–36.0)
MCV: 85.9 fL (ref 79.5–101.0)
MONO#: 0.7 10*3/uL (ref 0.1–0.9)
MONO%: 11.2 % (ref 0.0–14.0)
NEUT%: 72.6 % (ref 38.4–76.8)
NEUTROS ABS: 4.5 10*3/uL (ref 1.5–6.5)
Platelets: 229 10*3/uL (ref 145–400)
RBC: 4.22 10*6/uL (ref 3.70–5.45)
RDW: 19.1 % — AB (ref 11.2–14.5)
WBC: 6.2 10*3/uL (ref 3.9–10.3)
lymph#: 0.9 10*3/uL (ref 0.9–3.3)

## 2015-06-19 MED ORDER — SODIUM CHLORIDE 0.9 % IV SOLN
485.0000 mg/m2 | Freq: Once | INTRAVENOUS | Status: AC
  Administered 2015-06-19: 900 mg via INTRAVENOUS
  Filled 2015-06-19: qty 32

## 2015-06-19 MED ORDER — SODIUM CHLORIDE 0.9 % IJ SOLN
10.0000 mL | INTRAMUSCULAR | Status: DC | PRN
  Administered 2015-06-19: 10 mL
  Filled 2015-06-19: qty 10

## 2015-06-19 MED ORDER — HEPARIN SOD (PORK) LOCK FLUSH 100 UNIT/ML IV SOLN
500.0000 [IU] | Freq: Once | INTRAVENOUS | Status: AC | PRN
  Administered 2015-06-19: 500 [IU]
  Filled 2015-06-19: qty 5

## 2015-06-19 MED ORDER — SODIUM CHLORIDE 0.9 % IV SOLN
Freq: Once | INTRAVENOUS | Status: AC
Start: 2015-06-19 — End: 2015-06-19
  Administered 2015-06-19: 11:00:00 via INTRAVENOUS

## 2015-06-19 MED ORDER — SODIUM CHLORIDE 0.9 % IV SOLN
Freq: Once | INTRAVENOUS | Status: AC
  Administered 2015-06-19: 11:00:00 via INTRAVENOUS
  Filled 2015-06-19: qty 4

## 2015-06-19 MED ORDER — SODIUM CHLORIDE 0.9% FLUSH
10.0000 mL | INTRAVENOUS | Status: DC | PRN
  Administered 2015-06-19: 10 mL via INTRAVENOUS
  Filled 2015-06-19: qty 10

## 2015-06-19 NOTE — Assessment & Plan Note (Signed)
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 She have responded very well with treatment. However, she developed significant fatigue.  We discussed the risks, benefit, side effects of continuing with treatment. Previously, when I gave her a treatment holiday, her disease relapsed significantly. We also discussed other treatment options including the use of Opdivo/Keytruda  after very prolonged discussion, with any agreement to restart treatment next month but to lengthen her treatment interval to once a month She tolerated that well and we will continue to maintenance treatment with Alimta once every 28 days

## 2015-06-19 NOTE — Progress Notes (Signed)
[facility] OFFICE PROGRESS NOTE  Patient Care Team: Zenia Resides, MD as PCP - General (Family Medicine) Gaye Pollack, MD (Cardiothoracic Surgery) Thea Silversmith, MD (Radiation Oncology) Dickie La, MD (Family Medicine) Heath Lark, MD as Consulting Physician (Hematology and Oncology) Renella Cunas, MD as Consulting Physician (Cardiology) Greg Cutter, LCSW as [place] Management (Licensed Clinical Social Worker) Verlin Grills, RN as [place] Management  SUMMARY OF ONCOLOGIC HISTORY: Oncology History   Colon cancer   Primary site: Colon and Rectum (Left)   Staging method: AJCC 7th Edition   Clinical: Stage I (T2, N0, M0) signed by Heath Lark, MD on 05/31/2013  2:39 PM   Pathologic: Stage I (T2, N0, cM0) signed by Heath Lark, MD on 05/31/2013  2:39 PM   Summary: Stage I (T2, N0, cM0) Lung cancer, EGFR/ALK negative, recurrence after initial resection to LN and brain   Primary site: Lung (Left)   Staging method: AJCC 7th Edition   Clinical: Stage IV (T1, N2, M1b) signed by Heath Lark, MD on 05/31/2013  2:26 PM   Pathologic: Stage IV (T1, N2, M1b) signed by Heath Lark, MD on 05/31/2013  2:26 PM   Summary: Stage IV (T1, N2, M1b)       Cancer of upper lobe of left lung, Adenocarcinoma   03/01/2007 Procedure Colonoscopy revealed abnormalities and biopsy show high-grade dysplasia   04/01/2007 Surgery She underwent sigmoid resection which showed T2 N0 colon cancer, and negative margins and all of 17 lymph nodes were negative   02/29/2008 Procedure Repeat surveillance colonoscopy was negative.   06/16/2010 Surgery She underwent left upper lobectomy we show well-differentiated adenocarcinoma of the lung, T1, N0, M0   03/18/2011 Procedure Repeat colonoscopy show multiple polyps but there were benign   10/19/2011 Procedure Biopsy of mediastinal lymph node came back positive for recurrence of lung cancer, EGFR and ALK negative    11/16/2011 - 12/14/2011 Chemotherapy She received concurrent chemoradiation therapy with weekly carboplatin and Taxol.   11/16/2011 - 12/29/2011 Radiation Therapy She received radiation therapy with weekly chemotherapy   02/15/2012 Imaging MR of the brain showed a new intracranial metastases. This was subsequently treated with stereotactic radiosurgery.   03/07/2012 - 04/12/2013 Chemotherapy She received chemotherapy with maintainence Alimta every 3 weeks. Chemotherapy was discontinued due to profound fatigue   03/02/2013 Procedure She had therapeutic ultrasound guidance thoracentesis for pleural effusion that came back negative for cancer   05/04/2013 Procedure She had repeat ultrasound-guided thoracentesis again and cytology was negative   05/29/2013 Imaging Repeat CT scan of the chest, abdomen and pelvis show no evidence of disease but persistent right-sided pleural effusion   06/02/2013 Surgery The patient had placement of Pleurx catheter and subsequently underwent pleurodesis.   09/22/2013 Imaging Repeat CT scan show no evidence of active disease. There are nonspecific lymphadenopathy and she is placed on observation.   03/23/2014 Imaging Repeat CT scan of the chest, abdomen and pelvis show recurrence of cancer with widespread bilateral pulmonary metastasis.   05/14/2014 Imaging Imaging of the chest and brain were repeated due to delay of initiation of chemotherapy. Overall, chest CT scan show stable disease. MRI of the head was negative for recurrence   05/16/2014 - 07/18/2014 Chemotherapy  she completed 4 cycles of combination chemotherapy with carboplatin and Alimta   07/16/2014 Imaging  repeat CT scan of the chest, abdomen and pelvis show regression in the size of lung nodules.   10/16/2014 Imaging  repeat  CT scan of the chest, abdomen and pelvis show regression in the size of lung nodules.   01/29/2015 Imaging Repeat CT scan showed stable disease   02/01/2015 Imaging MRI brain is negative   04/30/2015  Imaging CT scan of the chest, abdomen and pelvis showed stable disease    Brain metastases treated with radiosurgery   02/26/2012 Initial Diagnosis Brain metastases treated with radiosurgery    INTERVAL HISTORY: Please see below for problem oriented charting. She returns for further follow-up. Since I switched her treatment to be given once a month, she felt a little better. She had one episode of nausea and diarrhea which she attributed to eating certain food in a restaurant after Christmas. It had resolved since then. She complained of occasional dizziness after getting up. Her history is suggestive of postural hypotension. She denies focal neurological deficit, changes in vision or weakness She denies headache. No recent cough.  REVIEW OF SYSTEMS:   Constitutional: Denies fevers, chills or abnormal weight loss Eyes: Denies blurriness of vision Ears, nose, mouth, throat, and face: Denies mucositis or sore throat Respiratory: Denies cough, dyspnea or wheezes Cardiovascular: Denies palpitation, chest discomfort or lower extremity swelling Skin: Denies abnormal skin rashes Lymphatics: Denies new lymphadenopathy or easy bruising Neurological:Denies numbness, tingling or new weaknesses Behavioral/Psych: Mood is stable, no new changes  All other systems were reviewed with the patient and are negative.  I have reviewed the past medical history, past surgical history, social history and family history with the patient and they are unchanged from previous note.  ALLERGIES:  is allergic to adhesive and lisinopril.  MEDICATIONS:  Current Outpatient Prescriptions  Medication Sig Dispense Refill  . albuterol (PROVENTIL HFA;VENTOLIN HFA) 108 (90 BASE) MCG/ACT inhaler Inhale 1 puff into the lungs 2 (two) times daily. 6.7 g prn  . aspirin 81 MG chewable tablet Chew 81 mg by mouth daily.    . diclofenac sodium (VOLTAREN) 1 % GEL Apply 2 g topically 4 (four) times daily. As needed for pain 355  g 11  . folic acid (FOLVITE) 1 MG tablet Take 1 tablet (1 mg total) by mouth daily. 90 tablet 3  . furosemide (LASIX) 20 MG tablet Take 1 tablet (20 mg total) by mouth daily. 90 tablet 3  . lidocaine-prilocaine (EMLA) cream Apply topically as needed. Apply to porta cath site one hour prior to needle stick. 30 g 6  . mirtazapine (REMERON) 15 MG tablet Take 1 tablet (15 mg total) by mouth at bedtime. 90 tablet 0  . nebivolol (BYSTOLIC) 10 MG tablet Take 1 tablet (10 mg total) by mouth daily. 90 tablet 4  . nitroGLYCERIN (NITROSTAT) 0.4 MG SL tablet Place 1 tablet (0.4 mg total) under the tongue every 5 (five) minutes as needed. For chest pain 25 tablet 5  . omeprazole (PRILOSEC) 40 MG capsule Take 1 capsule (40 mg total) by mouth daily. 90 capsule 3  . PATADAY 0.2 % SOLN     . potassium chloride SA (K-DUR,KLOR-CON) 20 MEQ tablet TAKE 1 TABLET (20 MEQ TOTAL) BY MOUTH 2 (TWO) TIMES DAILY. 180 tablet 0  . pravastatin (PRAVACHOL) 40 MG tablet TAKE 1 TABLET BY MOUTH ONCE DAILY 90 tablet 3  . tiZANidine (ZANAFLEX) 2 MG tablet Take 1 tablet (2 mg total) by mouth every 6 (six) hours as needed (Back Muscle Spasm). 30 tablet 3  . traMADol (ULTRAM) 50 MG tablet Take 1 tablet (50 mg total) by mouth every 6 (six) hours as needed for moderate pain or severe  pain. 90 tablet 1  . [DISCONTINUED] buPROPion (WELLBUTRIN XL) 150 MG 24 hr tablet Take 1 tablet (150 mg total) by mouth 2 (two) times daily. 60 tablet 5   No current facility-administered medications for this visit.    PHYSICAL EXAMINATION: ECOG PERFORMANCE STATUS: 1 - Symptomatic but completely ambulatory  Filed Vitals:   06/19/15 1001  BP: 155/71  Pulse: 81  Temp: 98 F (36.7 C)  Resp: 17   Filed Weights   06/19/15 1001  Weight: 164 lb 1.6 oz (74.435 kg)    GENERAL:alert, no distress and comfortable SKIN: skin color, texture, turgor are normal, no rashes or significant lesions EYES: normal, Conjunctiva are pink and non-injected, sclera  clear OROPHARYNX:no exudate, no erythema and lips, buccal mucosa, and tongue normal  NECK: supple, thyroid normal size, non-tender, without nodularity LYMPH:  no palpable lymphadenopathy in the cervical, axillary or inguinal LUNGS: clear to auscultation and percussion with normal breathing effort HEART: regular rate & rhythm and no murmurs and no lower extremity edema ABDOMEN:abdomen soft, non-tender and normal bowel sounds Musculoskeletal:no cyanosis of digits and no clubbing  NEURO: alert & oriented x 3 with fluent speech, no focal motor/sensory deficits  LABORATORY DATA:  I have reviewed the data as listed    Component Value Date/Time   NA 142 06/19/2015 0937   NA 141 12/26/2013 0946   NA 143 09/28/2011 0833   K 3.7 06/19/2015 0937   K 4.0 12/26/2013 0946   K 3.5 09/28/2011 0833   CL 106 12/26/2013 0946   CL 102 10/24/2012 1001   CL 98 09/28/2011 0833   CO2 25 06/19/2015 0937   CO2 27 12/26/2013 0946   CO2 30 09/28/2011 0833   GLUCOSE 94 06/19/2015 0937   GLUCOSE 101* 12/26/2013 0946   GLUCOSE 111* 10/24/2012 1001   GLUCOSE 112 09/28/2011 0833   BUN 11.6 06/19/2015 0937   BUN 9 12/26/2013 0946   BUN 9 09/28/2011 0833   CREATININE 1.2* 06/19/2015 0937   CREATININE 0.83 12/26/2013 0946   CREATININE 0.7 09/28/2011 0833   CALCIUM 9.1 06/19/2015 0937   CALCIUM 8.8 12/26/2013 0946   CALCIUM 8.7 09/28/2011 0833   PROT 7.1 06/19/2015 0937   PROT 6.8 12/26/2013 0946   PROT 7.2 09/28/2011 0833   ALBUMIN 2.9* 06/19/2015 0937   ALBUMIN 3.8 12/26/2013 0946   ALBUMIN 3.2* 09/28/2011 0833   AST 14 06/19/2015 0937   AST 12 12/26/2013 0946   AST 15 09/28/2011 0833   ALT 11 06/19/2015 0937   ALT <8 12/26/2013 0946   ALT 16 09/28/2011 0833   ALKPHOS 116 06/19/2015 0937   ALKPHOS 148* 12/26/2013 0946   ALKPHOS 99* 09/28/2011 0833   BILITOT 0.32 06/19/2015 0937   BILITOT 0.6 12/26/2013 0946   BILITOT 0.80 09/28/2011 0833   GFRNONAA 74* 06/12/2013 0625   GFRAA 86* 06/12/2013  0625    No results found for: SPEP, UPEP  Lab Results  Component Value Date   WBC 6.2 06/19/2015   NEUTROABS 4.5 06/19/2015   HGB 11.3* 06/19/2015   HCT 36.2 06/19/2015   MCV 85.9 06/19/2015   PLT 229 06/19/2015      Chemistry      Component Value Date/Time   NA 142 06/19/2015 0937   NA 141 12/26/2013 0946   NA 143 09/28/2011 0833   K 3.7 06/19/2015 0937   K 4.0 12/26/2013 0946   K 3.5 09/28/2011 0833   CL 106 12/26/2013 0946   CL 102 10/24/2012 1001  CL 98 09/28/2011 0833   CO2 25 06/19/2015 0937   CO2 27 12/26/2013 0946   CO2 30 09/28/2011 0833   BUN 11.6 06/19/2015 0937   BUN 9 12/26/2013 0946   BUN 9 09/28/2011 0833   CREATININE 1.2* 06/19/2015 0937   CREATININE 0.83 12/26/2013 0946   CREATININE 0.7 09/28/2011 0833      Component Value Date/Time   CALCIUM 9.1 06/19/2015 0937   CALCIUM 8.8 12/26/2013 0946   CALCIUM 8.7 09/28/2011 0833   ALKPHOS 116 06/19/2015 0937   ALKPHOS 148* 12/26/2013 0946   ALKPHOS 99* 09/28/2011 0833   AST 14 06/19/2015 0937   AST 12 12/26/2013 0946   AST 15 09/28/2011 0833   ALT 11 06/19/2015 0937   ALT <8 12/26/2013 0946   ALT 16 09/28/2011 0833   BILITOT 0.32 06/19/2015 0937   BILITOT 0.6 12/26/2013 0946   BILITOT 0.80 09/28/2011 0833     ASSESSMENT & PLAN:  Cancer of upper lobe of left lung, Adenocarcinoma  Remarkably, her CT scan showed no evidence of active disease. She have responded very well with treatment. However, she developed significant fatigue.  We discussed the risks, benefit, side effects of continuing with treatment. Previously, when I gave her a treatment holiday, her disease relapsed significantly. We also discussed other treatment options including the use of Opdivo/Keytruda  after very prolonged discussion, with any agreement to restart treatment next month but to lengthen her treatment interval to once a month She tolerated that well and we will continue to maintenance treatment with Alimta once every 28  days  TOBACCO ABUSE I spent some time counseling the patient the importance of tobacco cessation. She is currently not interested to quit now.   Brain metastases treated with radiosurgery Her most recent MRI showed no evidence of disease recurrence. Continue to monitor carefully. She had recent dizziness but no focal neurological deficits or headache. Her MRI is scheduled for next month with follow-up to see radiation oncologist. Continue to monitor carefully   Anemia due to chemotherapy This is likely due to recent treatment. The patient denies recent history of bleeding such as epistaxis, hematuria or hematochezia. She is asymptomatic from the anemia. I will observe for now.  She does not require transfusion now. I will continue the chemotherapy at current dose without dosage adjustment.  If the anemia gets progressive worse in the future, I might have to delay her treatment or adjust the chemotherapy dose.   Essential hypertension Her blood pressure is elevated but yet she had dizziness. She has been taking Lasix and by Bistolic together in the mornings. I recommend she takes Lasix in the morning and to change Bistolic in the evening. We will continue to monitor her blood pressure carefully  Postural dizziness She has some postural dizziness with one fall recently. She denies any focal neurological deficit or headaches. I recommend she switch her other blood pressure medication to take it in the evening and I will reassess in the next visit.   No orders of the defined types were placed in this encounter.   All questions were answered. The patient knows to call the clinic with any problems, questions or concerns. No barriers to learning was detected. I spent 25 minutes counseling the patient face to face. The total time spent in the appointment was 30 minutes and more than 50% was on counseling and review of test results     [facility], [place], MD 06/19/2015 10:53 AM

## 2015-06-19 NOTE — Assessment & Plan Note (Signed)
I spent some time counseling the patient the importance of tobacco cessation. She is currently not interested to quit now. 

## 2015-06-19 NOTE — Assessment & Plan Note (Signed)
She has some postural dizziness with one fall recently. She denies any focal neurological deficit or headaches. I recommend she switch her other blood pressure medication to take it in the evening and I will reassess in the next visit.

## 2015-06-19 NOTE — Assessment & Plan Note (Signed)

## 2015-06-19 NOTE — Telephone Encounter (Signed)
Per staff message and POF I have scheduled appts. Advised scheduler of appts. JMW  

## 2015-06-19 NOTE — Assessment & Plan Note (Signed)
Her blood pressure is elevated but yet she had dizziness. She has been taking Lasix and by Bistolic together in the mornings. I recommend she takes Lasix in the morning and to change Bistolic in the evening. We will continue to monitor her blood pressure carefully

## 2015-06-19 NOTE — Patient Instructions (Signed)
[facility] Discharge Instructions for Patients Receiving Chemotherapy  Today you received the following chemotherapy agents; Alimta.   To help prevent nausea and vomiting after your treatment, we encourage you to take your nausea medication as directed.    If you develop nausea and vomiting that is not controlled by your nausea medication, call the clinic.   BELOW ARE SYMPTOMS THAT SHOULD BE REPORTED IMMEDIATELY:  *FEVER GREATER THAN 100.5 F  *CHILLS WITH OR WITHOUT FEVER  NAUSEA AND VOMITING THAT IS NOT CONTROLLED WITH YOUR NAUSEA MEDICATION  *UNUSUAL SHORTNESS OF BREATH  *UNUSUAL BRUISING OR BLEEDING  TENDERNESS IN MOUTH AND THROAT WITH OR WITHOUT PRESENCE OF ULCERS  *URINARY PROBLEMS  *BOWEL PROBLEMS  UNUSUAL RASH Items with * indicate a potential emergency and should be followed up as soon as possible.  Feel free to call the clinic you have any questions or concerns. The clinic phone number is [phone].  Please show the CHEMO ALERT CARD at check-in to the Emergency Department and triage nurse.   

## 2015-06-19 NOTE — Assessment & Plan Note (Signed)
Her most recent MRI showed no evidence of disease recurrence. Continue to monitor carefully. She had recent dizziness but no focal neurological deficits or headache. Her MRI is scheduled for next month with follow-up to see radiation oncologist. Continue to monitor carefully

## 2015-06-19 NOTE — Telephone Encounter (Signed)
per pof to sch pt appt-gave pt copy of avs-sent MW email to sch trmt-pt to get updted copy b4 leaving

## 2015-06-21 ENCOUNTER — Telehealth: Payer: Self-pay | Admitting: Hematology and Oncology

## 2015-06-21 NOTE — Telephone Encounter (Signed)
per pof to sch pt appt-cld pt after reply and gave appt time & date

## 2015-06-26 ENCOUNTER — Other Ambulatory Visit: Payer: Self-pay | Admitting: Family Medicine

## 2015-07-03 ENCOUNTER — Encounter: Payer: Self-pay | Admitting: *Deleted

## 2015-07-03 ENCOUNTER — Ambulatory Visit: Payer: Self-pay | Admitting: *Deleted

## 2015-07-03 ENCOUNTER — Other Ambulatory Visit: Payer: Self-pay | Admitting: *Deleted

## 2015-07-03 NOTE — Patient Outreach (Signed)
[facility]) Care Management  [place]  07/03/2015   Lauren [name] 03/19/1951 [id number]  Subjective:RN Health Coach telephone call to patient.  [place] compliance verified. Patient stated she has fallen twice. Once in January and February. No injuries just soreness. Patient stated she had some dizziness and lost balance. Patient stated she is currently still smoking. Patient has no desire to stop. Explained to patient that if oxygen is needed that she can not smoke  in the room with the oxygen. Patient explained that when she brings her groceries in she can only bring one bag in and has to rest usually lay down. She stated that she feels heavy in her chest and short of breath. Per patient stated that at night she gets short of breath with coughing and uses her inhalers. Explained to patient that she needs to make her physician aware. RN asked patient about her blood pressure and she stated that she doesn't have a cuff that her neighbor does. Patient is coughing up clear sputum, not bloody.   Current Medications:  Current Outpatient Prescriptions  Medication Sig Dispense Refill  . albuterol (PROVENTIL HFA;VENTOLIN HFA) 108 (90 BASE) MCG/ACT inhaler Inhale 1 puff into the lungs 2 (two) times daily. 6.7 g prn  . aspirin 81 MG chewable tablet Chew 81 mg by mouth daily.    . diclofenac sodium (VOLTAREN) 1 % GEL Apply 2 g topically 4 (four) times daily. As needed for pain 151 g 11  . folic acid (FOLVITE) 1 MG tablet Take 1 tablet (1 mg total) by mouth daily. 90 tablet 3  . furosemide (LASIX) 20 MG tablet Take 1 tablet (20 mg total) by mouth daily. 90 tablet 3  . lidocaine-prilocaine (EMLA) cream Apply topically as needed. Apply to porta cath site one hour prior to needle stick. 30 g 6  . mirtazapine (REMERON) 15 MG tablet Take 1 tablet (15 mg total) by mouth at bedtime. 90 tablet 0  . nebivolol (BYSTOLIC) 10 MG tablet Take 1 tablet (10 mg total) by mouth daily. 90 tablet  4  . nitroGLYCERIN (NITROSTAT) 0.4 MG SL tablet Place 1 tablet (0.4 mg total) under the tongue every 5 (five) minutes as needed. For chest pain 25 tablet 5  . omeprazole (PRILOSEC) 40 MG capsule Take 1 capsule (40 mg total) by mouth daily. 90 capsule 3  . PATADAY 0.2 % SOLN     . potassium chloride SA (K-DUR,KLOR-CON) 20 MEQ tablet TAKE 1 TABLET TWICE DAILY 180 tablet 0  . pravastatin (PRAVACHOL) 40 MG tablet TAKE 1 TABLET BY MOUTH ONCE DAILY 90 tablet 3  . tiZANidine (ZANAFLEX) 2 MG tablet Take 1 tablet (2 mg total) by mouth every 6 (six) hours as needed (Back Muscle Spasm). 30 tablet 3  . traMADol (ULTRAM) 50 MG tablet Take 1 tablet (50 mg total) by mouth every 6 (six) hours as needed for moderate pain or severe pain. 90 tablet 1  . [DISCONTINUED] buPROPion (WELLBUTRIN XL) 150 MG 24 hr tablet Take 1 tablet (150 mg total) by mouth 2 (two) times daily. 60 tablet 5   No current facility-administered medications for this visit.    Functional Status:  In your present state of health, do you have any difficulty performing the following activities: 07/03/2015 06/04/2015  Hearing? N N  Vision? Y Y  Difficulty concentrating or making decisions? N N  Walking or climbing stairs? Y N  Dressing or bathing? N N  Doing errands, shopping? Lauren [name]  Preparing  Food and eating ? N -  Using the Toilet? N -  In the past six months, have you accidently leaked urine? N -  Do you have problems with loss of bowel control? N -  Managing your Medications? N -  Managing your Finances? N -  Housekeeping or managing your Housekeeping? N -    Fall/Depression Screening: PHQ 2/9 Scores 07/03/2015 06/04/2015 06/04/2015 02/08/2015 01/28/2015 12/31/2014 12/24/2014  PHQ - 2 Score 1 0 0 1 0 0 0   THN CM Care Plan Problem One        Most Recent Value   THN CM Short Term Goal #2 Start Date  -- [patient can use inhalers correctly . But needs reinforcing]   THN CM Short Term Goal #3 (0-30 days)  Patient will not have any falls within  the next  30 days   THN CM Short Term Goal #3 Start Date  07/03/15   Interventions for Short Tern Goal #3  RN discussed with patient falls prevention. RN discussed med alert. RN will send EMMI information on falls prevention.   THN CM Short Term Goal #4 (0-30 days)  Patient will understand oxygen safety if need usuage   THN CM Short Term Goal #4 Start Date  07/03/15   Interventions for Short Term Goal #4  RN discusse with patient O2 use and smoking liabilities. RN will send patient EMMI information on Oxygen safety. O2 will follow up with discussion and teachback.      Assessment:  Patient would continue to benefit from [facility] telephonic outreach for education support for diabetes self management. RN encourage patient to explain to Dr how she is feeling Patient needs a med Alert Patient needs blood pressure monitor and cuff  Plan:  RN will provided patient with blood pressure monitor Patient will come into office for instructions and teach back RN will give patient EMMI information on Oxygen safety RN will give patient information on Falls prevention RN will discuss Med alert with patient again RN will check O2 saturation when patient comes in for Blood pressure monitor Rn willdo a follow up outreach call withn a month.  [name], BSN, RN [facility] Care Management RN Health Coach Phone: [place] complies with applicable Federal civil rights laws and does not discriminate on the basis of race, color, national origin, age, disability, or sex. Espaol (Spanish)  [place] cumple con las leyes federales de derechos civiles aplicables y no discrimina por motivos de raza, color, nacionalidad, edad, discapacidad o sexo.    [place] ([place]  [place] tun th? lu?t dn quy?n hi?n hnh c?a Lin bang v khng phn bi?t ?i x? d?a trn ch?ng t?c, mu da, ngu?n g?c qu?c gia, ? tu?i, khuy?t t?t, ho?c gi?i  tnh.    (Arabic)    [place]                      .

## 2015-07-09 ENCOUNTER — Other Ambulatory Visit: Payer: Self-pay | Admitting: *Deleted

## 2015-07-09 NOTE — Patient Outreach (Signed)
[facility]) Care Management  07/09/2015  NELIDA MANDARINO 05/18/1951 [id number]   Patient came to office to pick up blood pressure monitor and instructions on usage. Patient By was 124/73 Hr 79.  RN noted patient having shortness of breath from parking lot to building. Patient sat for a few minutes. Oxygen saturation taken and was 98%. RN walked patient 50 feet and patient oxygen saturation dropped to 88%. Patient was very short of breath and dizzy and fatigued. RN stopped and patient sat down for a few minutes before continuing. Patient stated when going to the store she gets real short of breath and dizzy. Patient stated that she is only able to bring in one bag of groceries when she gets home and has to sit down and rest. Per patient she usually has to lay down for a few minutes.  Patient is still currently smoking. Patient is not on any home oxygen.  RN health coach explained to patient that she can not smoke around oxygen.  Patient understand that oxygen may be needed. RN health Coach gave patient information on Oxygen safety and Nebulizer treatments. Patient is not wanting to stop smoking and she understands that she needs to.  Patient understands that if oxygen is ordered she will not be able to smoke in the room with it.  RN Health Coach called Dr Heath Lark office and left message on her nurse voicemail of the findings above and my call back number.  [name], BSN, RN [facility] Care Management RN Health Coach Phone: [place] complies with applicable Federal civil rights laws and does not discriminate on the basis of race, color, national origin, age, disability, or sex. Espaol (Spanish)  [place] cumple con las leyes federales de derechos civiles aplicables y no discrimina por motivos de raza, color, nacionalidad, edad, discapacidad o sexo.    [place]  [place] tun th? lu?t  dn quy?n hi?n hnh c?a Lin bang v khng phn bi?t ?i x? d?a trn ch?ng t?c, mu da, ngu?n g?c qu?c gia, ? tu?i, khuy?t t?t, ho?c gi?i tnh.    (Arabic)    [place]                      .

## 2015-07-10 ENCOUNTER — Telehealth: Payer: Self-pay | Admitting: *Deleted

## 2015-07-10 NOTE — Telephone Encounter (Signed)
Dr Alvy Bimler states she has talked with patient about stopping smoking. Pt is not going to quit. Spoke with [place].

## 2015-07-11 ENCOUNTER — Other Ambulatory Visit: Payer: Self-pay | Admitting: *Deleted

## 2015-07-11 ENCOUNTER — Other Ambulatory Visit: Payer: Self-pay | Admitting: Hematology and Oncology

## 2015-07-11 ENCOUNTER — Encounter: Payer: Self-pay | Admitting: Hematology and Oncology

## 2015-07-11 ENCOUNTER — Telehealth: Payer: Self-pay | Admitting: Hematology and Oncology

## 2015-07-11 DIAGNOSIS — C3412 Malignant neoplasm of upper lobe, left bronchus or lung: Secondary | ICD-10-CM

## 2015-07-11 DIAGNOSIS — J438 Other emphysema: Secondary | ICD-10-CM

## 2015-07-11 DIAGNOSIS — J9621 Acute and chronic respiratory failure with hypoxia: Secondary | ICD-10-CM

## 2015-07-11 HISTORY — DX: Acute and chronic respiratory failure with hypoxia: J96.21

## 2015-07-11 NOTE — Telephone Encounter (Signed)
per pof to sch pt appt-referral to [facility] in [employer]

## 2015-07-15 ENCOUNTER — Encounter: Payer: Self-pay | Admitting: Pulmonary Disease

## 2015-07-15 ENCOUNTER — Ambulatory Visit (INDEPENDENT_AMBULATORY_CARE_PROVIDER_SITE_OTHER): Payer: Commercial Managed Care - HMO | Admitting: Pulmonary Disease

## 2015-07-15 ENCOUNTER — Ambulatory Visit (INDEPENDENT_AMBULATORY_CARE_PROVIDER_SITE_OTHER)
Admission: RE | Admit: 2015-07-15 | Discharge: 2015-07-15 | Disposition: A | Discharge status: Home / Self Care | Payer: Commercial Managed Care - HMO | Source: Ambulatory Visit | Attending: Pulmonary Disease | Admitting: Pulmonary Disease

## 2015-07-15 VITALS — BP 146/84 | HR 74 | Temp 97.4°F | Resp 18 | Ht 64.0 in | Wt 167.6 lb

## 2015-07-15 DIAGNOSIS — C7931 Secondary malignant neoplasm of brain: Secondary | ICD-10-CM

## 2015-07-15 DIAGNOSIS — R0600 Dyspnea, unspecified: Secondary | ICD-10-CM

## 2015-07-15 DIAGNOSIS — R0602 Shortness of breath: Secondary | ICD-10-CM | POA: Diagnosis not present

## 2015-07-15 DIAGNOSIS — J449 Chronic obstructive pulmonary disease, unspecified: Secondary | ICD-10-CM

## 2015-07-15 DIAGNOSIS — C3412 Malignant neoplasm of upper lobe, left bronchus or lung: Secondary | ICD-10-CM | POA: Diagnosis not present

## 2015-07-15 DIAGNOSIS — C7949 Secondary malignant neoplasm of other parts of nervous system: Secondary | ICD-10-CM

## 2015-07-15 HISTORY — DX: Dyspnea, unspecified: R06.00

## 2015-07-15 HISTORY — DX: Chronic obstructive pulmonary disease, unspecified: J44.9

## 2015-07-15 MED ORDER — CLONAZEPAM 0.5 MG PO TABS: 0.5000 mg | ORAL_TABLET | Freq: Two times a day (BID) | ORAL | Status: DC | PRN

## 2015-07-15 MED FILL — clonazePAM 0.5 MG TABS: 0.5 | 30 days supply | Qty: 60 | Fill #0

## 2015-07-15 NOTE — Progress Notes (Signed)
Subjective:     Patient ID: Lauren [name], female   DOB: February 27, 1951, 65 y.o.   MRN: [id number]  HPI   Cancer of upper lobe of left lung, Adenocarcinoma   03/01/2007 Procedure Colonoscopy revealed abnormalities and biopsy show high-grade dysplasia   04/01/2007 Surgery She underwent sigmoid resection which showed T2 N0 colon cancer, and negative margins and all of 17 lymph nodes were negative   02/29/2008 Procedure Repeat surveillance colonoscopy was negative.   06/16/2010 Surgery She underwent left upper lobectomy we show well-differentiated adenocarcinoma of the lung, T1, N0, M0   03/18/2011 Procedure Repeat colonoscopy show multiple polyps but there were benign   10/19/2011 Procedure Biopsy of mediastinal lymph node came back positive for recurrence of lung cancer, EGFR and ALK negative   11/16/2011 - 12/14/2011 Chemotherapy She received concurrent chemoradiation therapy with weekly carboplatin and Taxol.   11/16/2011 - 12/29/2011 Radiation Therapy She received radiation therapy with weekly chemotherapy   02/15/2012 Imaging MR of the brain showed a new intracranial metastases. This was subsequently treated with stereotactic radiosurgery.   03/07/2012 - 04/12/2013 Chemotherapy She received chemotherapy with maintainence Alimta every 3 weeks. Chemotherapy was discontinued due to profound fatigue   03/02/2013 Procedure She had therapeutic ultrasound guidance thoracentesis for pleural effusion that came back negative for cancer   05/04/2013 Procedure She had repeat ultrasound-guided thoracentesis again and cytology was negative   05/29/2013 Imaging Repeat CT scan of the chest, abdomen and pelvis show no evidence of disease but persistent right-sided pleural effusion   06/02/2013 Surgery The patient had placement of Pleurx catheter and subsequently underwent pleurodesis.   09/22/2013 Imaging Repeat CT scan show no evidence of active disease. There  are nonspecific lymphadenopathy and she is placed on observation.   03/23/2014 Imaging Repeat CT scan of the chest, abdomen and pelvis show recurrence of cancer with widespread bilateral pulmonary metastasis.   05/14/2014 Imaging Imaging of the chest and brain were repeated due to delay of initiation of chemotherapy. Overall, chest CT scan show stable disease. MRI of the head was negative for recurrence   05/16/2014 - 07/18/2014 Chemotherapy she completed 4 cycles of combination chemotherapy with carboplatin and Alimta   07/16/2014 Imaging repeat CT scan of the chest, abdomen and pelvis show regression in the size of lung nodules.   10/16/2014 Imaging repeat CT scan of the chest, abdomen and pelvis show regression in the size of lung nodules.   01/29/2015 Imaging Repeat CT scan showed stable disease   02/01/2015 Imaging MRI brain is negative   04/30/2015 Imaging CT scan of the chest, abdomen and pelvis showed stable disease         ~  Initial pulmonary consult by SN>   57 y/o BF referred by [facility] for COPD & dyspnea> she has a hx of stage 1 colon cancer diagnosed Nov2008, and stage 4 lung cancer diagnosed Jan2012...    Lauren [name] was diagnosed with Adenocarcinoma of lung Dx 05/2010 w/ LULobectomy, then 10/2011 mediastinal node Bx pos for metastatic lung cancer, given chemoradiation, then 01/2012 diagnosed w/ brain mets treated w/ stereotactic radiosurg, followed by additional chemotherapy (maintenance Altima)- stopped 03/2013 due to severe fatigue;  She developed right pleural effusion, neg cytology, drained & pleurodesis 05/2013;  CT Chest 03/2014 showed widespread pulm mets- treated w/ Carboplatin/ Altima x 4cycles last 06/2014; follow up CT scans have shown regression in size of lung nodules, then stability...     She is a current everyday smoker- starting at age 86 & smoked for 10  yrs up to 1ppd, currently smoking 1/2-1ppd; she is not interested in smoking cessation help;   Prev PFTs showed mod airflow obstruction c/w GOLD Stage 3 COPD and last CXR 12/2013 showed norm heart size, right sided pot-a-cath, right effusion, incr soft tissue changes along the right paratrachel region- stable, NAD;  Last CT Chest 04/2015 showed norm heart size, atherosclerosis in Ao & coronaries, no definite new mediastinal or hilar adenopathy, s/p left upper lobectomy, septal thickening & micronodularity throughout right lung w/o change from 01/2015, chr pleural thickening & calcif + mucoid impactions (see full report)...    Lauren [name] is c/o SOB/DOE w/ any activity- ok at rest but says she feels like there is an elephant on her body weighing her down, she feels tight, occas wheezing, min cough- mostly irritative and more at night, no sput, no hemoptysis, denies CP but she is weak & has fallen several times;  She lives alone & is here w/ her cousin today... Current meds include>  AlbutHFA prn, QQV95, Bystolic10, GLOVF64, P32RJJ, Prav40, Prilosec40, Zanaflex2 prn, Tramadol50 prn, Prozac20, Remeron15...   EXAM shows Afeb, VSS, O2sat=100% on RA at rest;  HEENT- neg, mallamapti1;  Chest- sl decr BS right base, clear w/o w/r/r;  Heart- RR w/o m/r/g;  Abd- soft, nontender, neg;  Ext- VI, 1+edema;  Neuro- gait abn & neuropathy...   CXR 07/15/15>  Norm heart size, post radiation scarring on right & bibasilar scarring w/o change from prev, clips in left hilum & right sided port-a-cath stable...  Spirometry 07/15/15>  FVC=1.58 (63%), FEV1=0.98 (50%), %1sec=62, mid-flows are reduced at 26% predicted;  This is c/w mod airflow obstruction and GOLD Stage 2-3 COPD...  Ambulatory oximetry 07/15/15 on RA>  O2sat=100% on RA at rest w/ pulse=85/min; she ambulated only 1lap, stopped 1/2 way due to dizziness & was able to continue under her own power, lowest O2sat=99% w/ pulse=90/min...  IMP >>      COPD/ Emphysema>  She has mod airflow obstruction and GOLD Stage 3 COPD; try ANORO one inhalation daily + ProairHFA rescue  inhaler prn...    Stage 4 Adenocarcinoma of the Lung>  Followed by DrGorsuch regularly...    Chronic right pleural effusion>  Non-malignant on thoracenteses, s/p pleur-X cath drainage and pleurodesis...    Cigarette Smoker>  She is still smoking 1/2 to 1ppd and we discussed cutting back some to start...    Chronic Hypoxemic Resp Failure>  She has been on Home O2 from prev hospitalization but current oxygenation at rest & with walking is OK...    MEDICAL issues>  HBP. CAD, VI/ edema, HL, GERD, Hx colon cancer, DJD, LBP, anxiety/ depression; [facility]CP [facility], DrMcDiarmid...  PLAN >>     We discussed the imperative to quit smoking;  She will try to cut back;  We will start Rx w/ ANORO one inhalation daily & use the PROAIR-HFA as a rescue inhaler;  Finally we discussed a trial of low dose KLONOPIN 0.46m bid to see if it relaxes her & improves her dyspnea... We will f/u in 6wks...    Past Medical History  Diagnosis Date  . Depression   . Hyperlipidemia   . Hypertension   . Lung nodule     FNA ordered for 04/02/10 by HA>pos Ca  . Chronic folliculitis     of groin  . Fibrocystic breast changes   . Family history of trichomonal vaginitis 05/2005  . Postmenopausal   . Depressive disorder   . Hypercholesterolemia   .  GERD (gastroesophageal reflux disease)   . Cerebral aneurysm   . Back pain   . Shortness of breath   . COPD (chronic obstructive pulmonary disease) ([place])   . Coronary artery disease   . Arthritis   . Weight loss 10/22/2011  . Status post radiation therapy 11/16/11 - 12/29/11    Right Lung and Mediastinum: 60 Gy  . Status post chemotherapy comp. 12/29/11    Carboplatin/Taxol  . S/P radiation therapy 03/01/12    SRS: 1 fraction / 20 Gray each to the Left Occipital Region and to the Right Insular Metastases  . On antineoplastic chemotherapy started 02/2012    Alimta  . Fatigue 02/06/2013  . Pleural effusion 05/03/2013  . Blurred vision, bilateral 06/06/2014  .  Colon cancer ([place]) 11/08  . Lung cancer ([place]) 06/16/10    PET scan 04/28/2010; primary: increase in size 02/2010 / Well Differentiated Adenocarcinoma of the lung   . Brain metastases ([place]) 02/15/12  . Other fatigue 11/28/2014  . Bilateral pleural effusion 08/09/2013  . POSTMENOPAUSAL SYNDROME 01/31/2009    Qualifier: Diagnosis of  By: Carlena Sax  MD, Colletta [place]    . Hypokalemia 08/09/2013  . 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 Bilateral leg edema 10/17/2014  . 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 Pleural effusion, malignant 05/2013    Recurrent Pleural Effusion  . Status post stereotactic radiosurgery 01/2012    for Brain Metastases  . Tobacco dependence   . Family history of Huntington's disease   . Acute on chronic respiratory failure with hypoxemia ([place]) 07/11/2015    Past Surgical History  Procedure Laterality Date  . Colectomy  03/22/07    Stage 1 pT2 N0, M0 Adenocarcinoma of the sigmoid  colon  . Tubal ligation    . Lung lobectomy  06/16/10    Left Upper Lobectomy  . Hernia repair    . Breast surgery      Bil lumpectomy  . Back surgery      Dr Luiz Ochoa  . Mediastinoscopy  10/19/2011    Procedure: MEDIASTINOSCOPY;  Surgeon: Gaye Pollack, MD;  Location[facility] OR;  Service: Thoracic;  Laterality: N/A;  . Cardiac cath x3    . Chest tube insertion Right 06/12/2013    Procedure: INSERTION PLEURAL DRAINAGE CATHETER;  Surgeon: Gaye Pollack, MD;  Location: [place];  Service: Thoracic;  Laterality: Right;  . [place] pleurodesis Right 06/12/2013    Procedure: Pietro Cassis;  Surgeon: Gaye Pollack, MD;  Location: [place];  Service: Thoracic;  Laterality: Right;  . Tunneled venous catheter placement      Port-a-Cath    Outpatient Encounter Prescriptions as of 07/15/2015  Medication Sig  . albuterol (PROVENTIL HFA;VENTOLIN HFA) 108 (90 BASE) MCG/ACT inhaler Inhale 1 puff into the lungs 2 (two) times daily.  Marland Kitchen aspirin 81 MG chewable tablet Chew 81 mg by mouth daily.  . diclofenac sodium  (VOLTAREN) 1 % GEL Apply 2 g topically 4 (four) times daily. As needed for pain  . FLUoxetine (PROZAC) 20 MG tablet Take 1 tablet by mouth daily.  . folic acid (FOLVITE) 1 MG tablet Take 1 tablet (1 mg total) by mouth daily.  . furosemide (LASIX) 20 MG tablet Take 1 tablet (20 mg total) by mouth daily.  Marland Kitchen lidocaine-prilocaine (EMLA) cream Apply topically as needed. Apply to porta cath site one hour prior to needle stick.  . mirtazapine (REMERON) 15 MG tablet Take 1 tablet (15 mg total) by mouth at bedtime.  . nebivolol (BYSTOLIC)  10 MG tablet Take 1 tablet (10 mg total) by mouth daily.  . nitroGLYCERIN (NITROSTAT) 0.4 MG SL tablet Place 1 tablet (0.4 mg total) under the tongue every 5 (five) minutes as needed. For chest pain  . omeprazole (PRILOSEC) 40 MG capsule Take 1 capsule (40 mg total) by mouth daily.  Marland Kitchen PATADAY 0.2 % SOLN as needed.   . potassium chloride SA (K-DUR,KLOR-CON) 20 MEQ tablet TAKE 1 TABLET TWICE DAILY (Patient taking differently: TAKE 1 TABLET once DAILY)  . pravastatin (PRAVACHOL) 40 MG tablet TAKE 1 TABLET BY MOUTH ONCE DAILY  . tiZANidine (ZANAFLEX) 2 MG tablet Take 1 tablet (2 mg total) by mouth every 6 (six) hours as needed (Back Muscle Spasm).  . traMADol (ULTRAM) 50 MG tablet Take 1 tablet (50 mg total) by mouth every 6 (six) hours as needed for moderate pain or severe pain.    Allergies  Allergen Reactions  . Adhesive [Tape] Rash  . Lisinopril Cough    Immunization History  Administered Date(s) Administered  . Influenza Split 04/22/2011, 02/19/2012  . Influenza Whole 02/13/2008, 04/23/2009, 02/27/2010  . Influenza,inj,Quad PF,36+ Mos 02/06/2013, 02/16/2014, 01/30/2015  . Pneumococcal Conjugate-13 11/08/2014  . Pneumococcal Polysaccharide-23 05/05/2010  . Td 01/16/2005  . Zoster 04/17/2014    Family History  Problem Relation Age of Onset  . Stomach cancer Maternal Aunt   . Breast cancer Cousin   . Cancer Sister     Lymphatic  . Osteoarthritis Father    . Gout Father   . Hypertension Father   . Heart disease Mother     pericarditis;   . Anesthesia problems Neg Hx   . Huntington's disease Son   . Huntington's disease Son     Social History   Social History  . Marital Status: Widowed    Spouse Name: N/A  . Number of Children: 3  . Years of Education: 12   Occupational History  . retired-graphic Engineer, civil (consulting)    Social History Main Topics  . Smoking status: Current Every Day Smoker -- 0.75 packs/day    Types: Cigarettes    Start date: 05/18/1965  . Smokeless tobacco: Never Used  . Alcohol Use: 0.0 oz/week    0 Standard drinks or equivalent per week     Comment: occasional  . Drug Use: No  . Sexual Activity: Not on file   Other Topics Concern  . Not on file   Social History Narrative   Health Care POA:    Emergency Contact: daughter, Lauren [name]: filed in chart   Who lives with you: self   Any pets: none   Diet: patient has a varied diet.   Exercise: pt does not have a regular exercise routine.   Seatbelts: Pt reports wearing seatbelt when in vehicles.    Hobbies: listening to music   Social:Widowed for more than 10 years and living in [place]. Patient has 3 adult children: 1 daughter Lauren [name]) and two sons; (one living in [facility] with Huntington's Disease and another son in [place], [place] with possible Huntington's Disease.   Patient is independent with ADLs. Walks with a cane as needed only.   Transportation: owns a car and still driving.    Caregiver and Emergency contact: Daughter, Lauren [name], [phone]    DME: [name], [employer]   Advance Directive: Yes, LW and HCPOA. Patient wishes to be cremated    Current Medications, Allergies, Past Medical History, Past Surgical History, Family  History, and Social History were reviewed in [employer] record.   Review of Systems             All symptoms NEG except where  BOLDED >>  Constitutional:  F/C/S, fatigue, anorexia, unexpected weight change. HEENT:  HA, visual changes, hearing loss, earache, nasal symptoms, sore throat, mouth sores, hoarseness. Resp:  cough, sputum, hemoptysis; SOB, tightness, wheezing. Cardio:  CP, palpit, DOE, orthopnea, edema. GI:  N/V/D/C, blood in stool; reflux, abd pain, distention, gas. GU:  dysuria, freq, urgency, hematuria, flank pain, voiding difficulty. MS:  joint pain, swelling, tenderness, decr ROM; neck pain, back pain, etc. Neuro:  HA, tremors, seizures, dizziness, syncope, weakness, numbness, gait abn. Skin:  suspicious lesions or skin rash. Heme:  adenopathy, bruising, bleeding. Psyche:  confusion, agitation, sleep disturbance, hallucinations, anxiety, depression suicidal.   Objective:   Physical Exam       Vital Signs:  Reviewed...  General:  WD, WN, 65 y/o BF in NAD, chr ill appearing; alert & oriented; pleasant & cooperative... HEENT:  [place]/AT; Conjunctiva- pink, Sclera- nonicteric, EOM-wnl, PERRLA, EACs-wax, TMs-no vis; NOSE-clear; THROAT-clear & wnl. Neck:  Supple w/ decr ROM; no JVD; normal carotid impulses w/o bruits; no thyromegaly or nodules palpated; no lymphadenopathy. Chest:  Sl decr BS right base, otherw clear without wheezes, rales, or rhonchi heard. Heart:  Regular Rhythm; norm S1 & S2 without murmurs, rubs, or gallops detected. Abdomen:  Soft & nontender- no guarding or rebound; normal bowel sounds; no organomegaly or masses palpated. Ext:  decr ROM; without deformities, +arthritic changes; no varicose veins, +venous insuffic, 1+edema;  Pulses intact w/o bruits. Neuro:  CNs II-XII intact; motor testing normal; sensory testing abnormal; gait abnormal & balance if fair Derm:  No lesions noted; no rash etc. Lymph:  No cervical, supraclavicular, axillary, or inguinal adenopathy palpated.   Assessment:      IMP >>      COPD/ Emphysema>  She has mod airflow obstruction and GOLD Stage 3 COPD; try  ANORO one inhalation daily + ProairHFA rescue inhaler prn...    Stage 4 Adenocarcinoma of the Lung>  Followed by DrGorsuch regularly...    Chronic right pleural effusion>  Non-malignant on thoracenteses, s/p pleur-X cath drainage and pleurodesis...    Cigarette Smoker>  She is still smoking 1/2 to 1ppd and we discussed cutting back some to start...    Chronic Hypoxemic Resp Failure>  She has been on Home O2 from prev hospitalization but current oxygenation at rest & with walking is OK...    MEDICAL issues>  HBP. CAD, VI/ edema, HL, GERD, Hx colon cancer, DJD, LBP, anxiety/ depression; [facility], DrMcDiarmid...  PLAN >>     We discussed the imperative to quit smoking;  She will try to cut back;  We will start Rx w/ ANORO one inhalation daily & use the PROAIR-HFA as a rescue inhaler;  Finally we discussed a trial of low dose KLONOPIN 0.63m bid to see if it relaxes her & improves her dyspnea... We will f/u in 6wks...     Plan:     Patient's Medications  New Prescriptions                    ANORO >> one inhalation daily         CLONAZEPAM (KLONOPIN) 0.5 MG TABLET    Take 1 tablet (0.5 mg total) by mouth 2 (two) times daily as needed for anxiety.  Previous Medications  ALBUTEROL (PROVENTIL HFA;VENTOLIN HFA) 108 (90 BASE) MCG/ACT INHALER    Inhale 1 puff into the lungs 2 (two) times daily.   ASPIRIN 81 MG CHEWABLE TABLET    Chew 81 mg by mouth daily.   DICLOFENAC SODIUM (VOLTAREN) 1 % GEL    Apply 2 g topically 4 (four) times daily. As needed for pain   FLUOXETINE (PROZAC) 20 MG TABLET    Take 1 tablet by mouth daily.   FOLIC ACID (FOLVITE) 1 MG TABLET    Take 1 tablet (1 mg total) by mouth daily.   FUROSEMIDE (LASIX) 20 MG TABLET    Take 1 tablet (20 mg total) by mouth daily.   LIDOCAINE-PRILOCAINE (EMLA) CREAM    Apply topically as needed. Apply to porta cath site one hour prior to needle stick.   MIRTAZAPINE (REMERON) 15 MG TABLET    Take 1 tablet (15 mg total) by mouth  at bedtime.   NEBIVOLOL (BYSTOLIC) 10 MG TABLET    Take 1 tablet (10 mg total) by mouth daily.   NITROGLYCERIN (NITROSTAT) 0.4 MG SL TABLET    Place 1 tablet (0.4 mg total) under the tongue every 5 (five) minutes as needed. For chest pain   OMEPRAZOLE (PRILOSEC) 40 MG CAPSULE    Take 1 capsule (40 mg total) by mouth daily.   PATADAY 0.2 % SOLN    as needed.    POTASSIUM CHLORIDE SA (K-DUR,KLOR-CON) 20 MEQ TABLET    TAKE 1 TABLET TWICE DAILY   PRAVASTATIN (PRAVACHOL) 40 MG TABLET    TAKE 1 TABLET BY MOUTH ONCE DAILY   TIZANIDINE (ZANAFLEX) 2 MG TABLET    Take 1 tablet (2 mg total) by mouth every 6 (six) hours as needed (Back Muscle Spasm).   TRAMADOL (ULTRAM) 50 MG TABLET    Take 1 tablet (50 mg total) by mouth every 6 (six) hours as needed for moderate pain or severe pain.  Modified Medications   No medications on file  Discontinued Medications   No medications on file

## 2015-07-15 NOTE — Patient Instructions (Signed)
Sheritta-- it was great meeting you today...  Today we checked a CXR & did a breathing test and an ambulatory oximetry test...    We will contact you w/ the results when available...   We decided to add ANORO inhaler one puff daily each AM  You may continue to use the PROAIR-HFA one puff every 4H as needed for wheezing...  We also gave you a trial of KLONOPIN 0.'5mg'$  one tab twice daily to relax your chest wall muscles and allow for a deeper breath...  Call me in 2-3 weeks to let me know how this is working for you...  Let's plan a follow up visit in 6-8 weeks, sooner if needed for problems.Marland KitchenMarland Kitchen

## 2015-07-17 ENCOUNTER — Other Ambulatory Visit (HOSPITAL_BASED_OUTPATIENT_CLINIC_OR_DEPARTMENT_OTHER): Payer: Commercial Managed Care - HMO

## 2015-07-17 ENCOUNTER — Ambulatory Visit: Payer: Commercial Managed Care - HMO

## 2015-07-17 ENCOUNTER — Ambulatory Visit (HOSPITAL_BASED_OUTPATIENT_CLINIC_OR_DEPARTMENT_OTHER): Payer: Commercial Managed Care - HMO

## 2015-07-17 VITALS — BP 135/75 | HR 79 | Temp 98.0°F | Resp 18

## 2015-07-17 DIAGNOSIS — Z95828 Presence of other vascular implants and grafts: Secondary | ICD-10-CM

## 2015-07-17 DIAGNOSIS — C3412 Malignant neoplasm of upper lobe, left bronchus or lung: Secondary | ICD-10-CM

## 2015-07-17 DIAGNOSIS — Z5111 Encounter for antineoplastic chemotherapy: Secondary | ICD-10-CM

## 2015-07-17 LAB — CBC WITH DIFFERENTIAL/PLATELET
BASO%: 0.4 % (ref 0.0–2.0)
Basophils Absolute: 0 10*3/uL (ref 0.0–0.1)
EOS%: 2.2 % (ref 0.0–7.0)
Eosinophils Absolute: 0.1 10*3/uL (ref 0.0–0.5)
HCT: 35.7 % (ref 34.8–46.6)
HGB: 11 g/dL — ABNORMAL LOW (ref 11.6–15.9)
LYMPH%: 21.6 % (ref 14.0–49.7)
MCH: 26.3 pg (ref 25.1–34.0)
MCHC: 30.8 g/dL — ABNORMAL LOW (ref 31.5–36.0)
MCV: 85.4 fL (ref 79.5–101.0)
MONO#: 0.5 10*3/uL (ref 0.1–0.9)
MONO%: 8.9 % (ref 0.0–14.0)
NEUT%: 66.9 % (ref 38.4–76.8)
NEUTROS ABS: 3.7 10*3/uL (ref 1.5–6.5)
Platelets: 261 10*3/uL (ref 145–400)
RBC: 4.18 10*6/uL (ref 3.70–5.45)
RDW: 17.1 % — ABNORMAL HIGH (ref 11.2–14.5)
WBC: 5.5 10*3/uL (ref 3.9–10.3)
lymph#: 1.2 10*3/uL (ref 0.9–3.3)
nRBC: 0 % (ref 0–0)

## 2015-07-17 LAB — COMPREHENSIVE METABOLIC PANEL
ALBUMIN: 2.7 g/dL — AB (ref 3.5–5.0)
ALK PHOS: 115 U/L (ref 40–150)
ALT: <9 U/L (ref 0–55)
AST: 14 U/L (ref 5–34)
Anion Gap: 9 mEq/L (ref 3–11)
BUN: 10.6 mg/dL (ref 7.0–26.0)
CO2: 25 meq/L (ref 22–29)
Calcium: 8.8 mg/dL (ref 8.4–10.4)
Chloride: 109 mEq/L (ref 98–109)
Creatinine: 1.3 mg/dL — ABNORMAL HIGH (ref 0.6–1.1)
EGFR: 52 mL/min/{1.73_m2} — ABNORMAL LOW (ref >90)
GLUCOSE: 86 mg/dL (ref 70–140)
POTASSIUM: 3.7 meq/L (ref 3.5–5.1)
SODIUM: 143 meq/L (ref 136–145)
TOTAL PROTEIN: 6.8 g/dL (ref 6.4–8.3)

## 2015-07-17 MED ORDER — SODIUM CHLORIDE 0.9 % IV SOLN
Freq: Once | INTRAVENOUS | Status: AC
  Administered 2015-07-17: 13:00:00 via INTRAVENOUS
  Filled 2015-07-17: qty 4

## 2015-07-17 MED ORDER — SODIUM CHLORIDE 0.9 % IV SOLN
Freq: Once | INTRAVENOUS | Status: AC
  Administered 2015-07-17: 12:00:00 via INTRAVENOUS

## 2015-07-17 MED ORDER — HEPARIN SOD (PORK) LOCK FLUSH 100 UNIT/ML IV SOLN
500.0000 [IU] | Freq: Once | INTRAVENOUS | Status: AC | PRN
  Administered 2015-07-17: 500 [IU]
  Filled 2015-07-17: qty 5

## 2015-07-17 MED ORDER — SODIUM CHLORIDE 0.9% FLUSH
10.0000 mL | INTRAVENOUS | Status: DC | PRN
  Administered 2015-07-17: 10 mL via INTRAVENOUS
  Filled 2015-07-17: qty 10

## 2015-07-17 MED ORDER — CYANOCOBALAMIN 1000 MCG/ML IJ SOLN
INTRAMUSCULAR | Status: AC
  Filled 2015-07-17: qty 1

## 2015-07-17 MED ORDER — CYANOCOBALAMIN 1000 MCG/ML IJ SOLN
1000.0000 ug | Freq: Once | INTRAMUSCULAR | Status: AC
  Administered 2015-07-17: 1000 ug via INTRAMUSCULAR

## 2015-07-17 MED ORDER — PEMETREXED DISODIUM CHEMO INJECTION 500 MG
485.0000 mg/m2 | Freq: Once | INTRAVENOUS | Status: AC
  Administered 2015-07-17: 900 mg via INTRAVENOUS
  Filled 2015-07-17: qty 20

## 2015-07-17 MED ORDER — SODIUM CHLORIDE 0.9 % IJ SOLN
10.0000 mL | INTRAMUSCULAR | Status: DC | PRN
  Administered 2015-07-17: 10 mL
  Filled 2015-07-17: qty 10

## 2015-07-17 NOTE — Patient Instructions (Signed)
[facility] Discharge Instructions for Patients Receiving Chemotherapy  Today you received the following chemotherapy agents Alimta.  To help prevent nausea and vomiting after your treatment, we encourage you to take your nausea medication as prescribed.   If you develop nausea and vomiting that is not controlled by your nausea medication, call the clinic.   BELOW ARE SYMPTOMS THAT SHOULD BE REPORTED IMMEDIATELY:  *FEVER GREATER THAN 100.5 F  *CHILLS WITH OR WITHOUT FEVER  NAUSEA AND VOMITING THAT IS NOT CONTROLLED WITH YOUR NAUSEA MEDICATION  *UNUSUAL SHORTNESS OF BREATH  *UNUSUAL BRUISING OR BLEEDING  TENDERNESS IN MOUTH AND THROAT WITH OR WITHOUT PRESENCE OF ULCERS  *URINARY PROBLEMS  *BOWEL PROBLEMS  UNUSUAL RASH Items with * indicate a potential emergency and should be followed up as soon as possible.  Feel free to call the clinic you have any questions or concerns. The clinic phone number is [phone].  Please show the CHEMO ALERT CARD at check-in to the Emergency Department and triage nurse.   

## 2015-07-17 NOTE — Patient Instructions (Signed)

## 2015-07-18 ENCOUNTER — Telehealth: Payer: Self-pay | Admitting: Pulmonary Disease

## 2015-07-18 NOTE — Telephone Encounter (Signed)
Per SN:  Stick with it, but lets adjust dose. 1. Continue Anoro 1 puff once daily 2. Continue Klonopin 0.'5mg'$  but try >> 1/2 tabs 3 times daily (1 in the am, 1 in the afternoon, 1 in the pm)

## 2015-07-18 NOTE — Telephone Encounter (Signed)
Spoke with the pt and notified of recs per SN  She verbalized understanding and nothing further needed

## 2015-07-18 NOTE — Telephone Encounter (Signed)
Spoke with pt, states she was given Klonopin at last office visit, states medication is making pt dizzy, unable to sleep.  Pt states the medication is helping with her breathing, but wants to know if this is something she should only take qhs, or try to take 0.5 tablet, or if SN has another recommendation for her.    Pt uses [facility].    SN please advise on recs.  Thanks!

## 2015-07-25 ENCOUNTER — Ambulatory Visit
Admission: RE | Admit: 2015-07-25 | Discharge: 2015-07-25 | Disposition: A | Discharge status: Home / Self Care | Payer: Commercial Managed Care - HMO | Source: Ambulatory Visit | Attending: Radiation Oncology | Admitting: Radiation Oncology

## 2015-07-25 ENCOUNTER — Ambulatory Visit (HOSPITAL_BASED_OUTPATIENT_CLINIC_OR_DEPARTMENT_OTHER): Payer: Commercial Managed Care - HMO

## 2015-07-25 DIAGNOSIS — C7931 Secondary malignant neoplasm of brain: Secondary | ICD-10-CM

## 2015-07-25 DIAGNOSIS — Z95828 Presence of other vascular implants and grafts: Secondary | ICD-10-CM

## 2015-07-25 DIAGNOSIS — C3412 Malignant neoplasm of upper lobe, left bronchus or lung: Secondary | ICD-10-CM | POA: Diagnosis not present

## 2015-07-25 DIAGNOSIS — Z452 Encounter for adjustment and management of vascular access device: Secondary | ICD-10-CM

## 2015-07-25 DIAGNOSIS — C7949 Secondary malignant neoplasm of other parts of nervous system: Principal | ICD-10-CM

## 2015-07-25 LAB — CBC WITH DIFFERENTIAL/PLATELET
BASO%: 0.8 % (ref 0.0–2.0)
BASOS ABS: 0 10*3/uL (ref 0.0–0.1)
EOS%: 2.2 % (ref 0.0–7.0)
Eosinophils Absolute: 0 10*3/uL (ref 0.0–0.5)
HEMATOCRIT: 33.5 % — AB (ref 34.8–46.6)
HEMOGLOBIN: 10.4 g/dL — AB (ref 11.6–15.9)
LYMPH#: 0.6 10*3/uL — AB (ref 0.9–3.3)
LYMPH%: 41.8 % (ref 14.0–49.7)
MCH: 25.7 pg (ref 25.1–34.0)
MCHC: 30.9 g/dL — AB (ref 31.5–36.0)
MCV: 83.1 fL (ref 79.5–101.0)
MONO#: 0.2 10*3/uL (ref 0.1–0.9)
MONO%: 12.7 % (ref 0.0–14.0)
NEUT#: 0.6 10*3/uL — ABNORMAL LOW (ref 1.5–6.5)
NEUT%: 42.5 % (ref 38.4–76.8)
Platelets: 115 10*3/uL — ABNORMAL LOW (ref 145–400)
RBC: 4.03 10*6/uL (ref 3.70–5.45)
RDW: 17.8 % — AB (ref 11.2–14.5)
WBC: 1.4 10*3/uL — ABNORMAL LOW (ref 3.9–10.3)

## 2015-07-25 LAB — COMPREHENSIVE METABOLIC PANEL
ALBUMIN: 2.8 g/dL — AB (ref 3.5–5.0)
ALT: 27 U/L (ref 0–55)
AST: 25 U/L (ref 5–34)
Alkaline Phosphatase: 100 U/L (ref 40–150)
Anion Gap: 8 mEq/L (ref 3–11)
BUN: 16.9 mg/dL (ref 7.0–26.0)
CO2: 25 mEq/L (ref 22–29)
CREATININE: 1.1 mg/dL (ref 0.6–1.1)
Calcium: 8.6 mg/dL (ref 8.4–10.4)
Chloride: 107 mEq/L (ref 98–109)
EGFR: 58 mL/min/{1.73_m2} — ABNORMAL LOW (ref >90)
GLUCOSE: 87 mg/dL (ref 70–140)
POTASSIUM: 4.1 meq/L (ref 3.5–5.1)
SODIUM: 140 meq/L (ref 136–145)
Total Bilirubin: 0.42 mg/dL (ref 0.20–1.20)
Total Protein: 6.8 g/dL (ref 6.4–8.3)

## 2015-07-25 MED ORDER — SODIUM CHLORIDE 0.9% FLUSH
10.0000 mL | INTRAVENOUS | Status: DC | PRN
  Administered 2015-07-25: 10 mL via INTRAVENOUS
  Filled 2015-07-25: qty 10

## 2015-07-25 MED ORDER — GADOBENATE DIMEGLUMINE 529 MG/ML IV SOLN
14.0000 mL | Freq: Once | INTRAVENOUS | Status: AC | PRN
  Administered 2015-07-25: 14 mL via INTRAVENOUS

## 2015-07-25 NOTE — Progress Notes (Signed)
Quick Note:  Please call patient with normal result.  Thanks. MM ______ 

## 2015-07-25 NOTE — Patient Instructions (Signed)

## 2015-07-26 ENCOUNTER — Telehealth: Payer: Self-pay | Admitting: *Deleted

## 2015-07-26 NOTE — Telephone Encounter (Signed)
Phoned patient per Dr. Johny Shears order to reassure her that her 07/25/2015 MRI was normal. Patient verbalized understanding and expressed appreciation for the call.

## 2015-07-30 ENCOUNTER — Ambulatory Visit: Payer: Self-pay | Admitting: *Deleted

## 2015-07-30 ENCOUNTER — Other Ambulatory Visit: Payer: Self-pay | Admitting: *Deleted

## 2015-07-30 DIAGNOSIS — J441 Chronic obstructive pulmonary disease with (acute) exacerbation: Secondary | ICD-10-CM

## 2015-07-30 NOTE — Patient Outreach (Signed)
[facility]) Care Management  [facility]  07/30/2015   Lauren [name] 08-Dec-1950 [id number]  Subjective: RN Health Coach telephone call to patient.  [employer] compliance verified. Per patient she has not had any falls since the last call. Per patient she does get dizzy sometimes and short of breath when trying to grocery shop.Patient uses a straight cane to ambulate with at times.  Per patient her appetite continues to be poor. She is not having any weight loss. Patient states that she is sedentary most of the time. Her family is trying to get her to go to the [facility] for the silver sneakers program.  Patient is receiving chemotherapy once a month. Patient stated she is worried about getting around to the Dr office and store since her car is beginning to wear out. Patient wants to talk with the social worker regarding all the different transportation routes that are available to her.Patient has agreed to follow up outreach calls.   Objective:   Current Medications:  Current Outpatient Prescriptions  Medication Sig Dispense Refill  . albuterol (PROVENTIL HFA;VENTOLIN HFA) 108 (90 BASE) MCG/ACT inhaler Inhale 1 puff into the lungs 2 (two) times daily. 6.7 g prn  . aspirin 81 MG chewable tablet Chew 81 mg by mouth daily.    . clonazePAM (KLONOPIN) 0.5 MG tablet Take 1 tablet (0.5 mg total) by mouth 2 (two) times daily as needed for anxiety. 60 tablet 2  . diclofenac sodium (VOLTAREN) 1 % GEL Apply 2 g topically 4 (four) times daily. As needed for pain 100 g 11  . FLUoxetine (PROZAC) 20 MG tablet Take 1 tablet by mouth daily.    . folic acid (FOLVITE) 1 MG tablet Take 1 tablet (1 mg total) by mouth daily. 90 tablet 3  . furosemide (LASIX) 20 MG tablet Take 1 tablet (20 mg total) by mouth daily. 90 tablet 3  . lidocaine-prilocaine (EMLA) cream Apply topically as needed. Apply to porta cath site one hour prior to needle stick. 30 g 6  . mirtazapine (REMERON) 15 MG tablet Take  1 tablet (15 mg total) by mouth at bedtime. 90 tablet 0  . nebivolol (BYSTOLIC) 10 MG tablet Take 1 tablet (10 mg total) by mouth daily. 90 tablet 4  . nitroGLYCERIN (NITROSTAT) 0.4 MG SL tablet Place 1 tablet (0.4 mg total) under the tongue every 5 (five) minutes as needed. For chest pain 25 tablet 5  . omeprazole (PRILOSEC) 40 MG capsule Take 1 capsule (40 mg total) by mouth daily. 90 capsule 3  . PATADAY 0.2 % SOLN as needed.     . potassium chloride SA (K-DUR,KLOR-CON) 20 MEQ tablet TAKE 1 TABLET TWICE DAILY (Patient taking differently: TAKE 1 TABLET once DAILY) 180 tablet 0  . pravastatin (PRAVACHOL) 40 MG tablet TAKE 1 TABLET BY MOUTH ONCE DAILY 90 tablet 3  . tiZANidine (ZANAFLEX) 2 MG tablet Take 1 tablet (2 mg total) by mouth every 6 (six) hours as needed (Back Muscle Spasm). 30 tablet 3  . traMADol (ULTRAM) 50 MG tablet Take 1 tablet (50 mg total) by mouth every 6 (six) hours as needed for moderate pain or severe pain. 90 tablet 1  . [DISCONTINUED] buPROPion (WELLBUTRIN XL) 150 MG 24 hr tablet Take 1 tablet (150 mg total) by mouth 2 (two) times daily. 60 tablet 5   No current facility-administered medications for this visit.    Functional Status:  In your present state of health, do you have any difficulty performing  the following activities: 07/03/2015 06/04/2015  Hearing? N N  Vision? Y Y  Difficulty concentrating or making decisions? N N  Walking or climbing stairs? Y N  Dressing or bathing? N N  Doing errands, shopping? Y N  Preparing Food and eating ? N -  Using the Toilet? N -  In the past six months, have you accidently leaked urine? N -  Do you have problems with loss of bowel control? N -  Managing your Medications? N -  Managing your Finances? N -  Housekeeping or managing your Housekeeping? N -    Fall/Depression Screening: PHQ 2/9 Scores 07/03/2015 06/04/2015 06/04/2015 02/08/2015 01/28/2015 12/31/2014 12/24/2014  PHQ - 2 Score 1 0 0 1 0 0 0   THN CM Care Plan Problem  One        Most Recent Value   Care Plan Problem One  Knowledge deficit in self management of COPD   Role Documenting the Problem One  Maish Vaya for Problem One  Active   THN Long Term Goal (31-90 days)  Patient will not have any readmissions of COPD witin the next 90 days   THN Long Term Goal Start Date  07/30/15 [Patient has not had any admissions will continue with goal]   Interventions for Problem One Long Term Goal  RN reiterates with patient the importance of keeping appointment with pulmonologist and PCP. RN reiterates with patient the importance of medication adherence. RN reiterates with patient the zones and action plan of COPD   THN CM Short Term Goal #1 (0-30 days)  Patient will be able to verbalize the zones and action plan of COPD within 30 days   THN CM Short Term Goal #1 Start Date  07/30/15 [This is a continue  process that needs to be gone over due to patient  memory ]   Interventions for Short Term Goal #1  [place]  sent patient a large refrigerator magnet as a reminder of zones and action plan. RN will  continually send  EMMI education material on COPD. RN will have a follow up outreach discussion with patient and use teachback   THN CM Short Term Goal #2 (0-30 days)  Patient will verbalize how to utilize her inhalers the correct way within 30 days   THN CM Short Term Goal #2 Start Date  07/30/15 [Patient is also placed on new inhaler in which RN is following the proper usage]   Interventions for Short Term Goal #2  RN sent patient EMMI information on utilizing inhalers correctly.  RN Health Coach will follow up with patient within a month.   THN CM Short Term Goal #3 (0-30 days)  Patient will not have any falls within the next  30 days   THN CM Short Term Goal #3 Start Date  07/30/15 [Patiernt has not had any falls in the past 30 days but has some dizziness at times RN will continue to monitor patient and make suggestions]   Interventions for Short Tern  Goal #3  RN discussed with patient falls prevention. RN reiterated with patient that a med alert bracelet is needed since she has some memory lapses.. RN  sent EMMI information on falls prevention.RN discussed with patient possibly a quad cane or a rollator walker since patient gets short of breth and has to sit down while shopping.     Assessment: Patient will continue to benefit from Health Coach telephonic outreach for education and support for COPD  self management   Plan:  RN will refer to social worker RN will send patient information on Healthy eating on a budget RN discussed possible recommendation of Recruitment consultant discussed  nutritional supplements when patient is not eating RN discussed going to [employer] silver sneakers program to build up Bruce, BSN, [place] Coach Phone: [facility] complies with applicable Federal civil rights laws and does not discriminate on the basis of race, color, national origin, age, disability, or sex. Espaol (Spanish)  [place] cumple con las leyes federales de derechos civiles aplicables y no discrimina por motivos de raza, color, nacionalidad, edad, discapacidad o sexo.    [place] ([place]  Gregg tun th? lu?t dn quy?n hi?n hnh c?a Lin bang v khng phn bi?t ?i x? d?a trn ch?ng t?c, mu da, ngu?n g?c qu?c gia, ? tu?i, khuy?t t?t, ho?c gi?i tnh.    (Arabic)    [place]                      .

## 2015-07-31 ENCOUNTER — Ambulatory Visit
Admission: RE | Admit: 2015-07-31 | Discharge: 2015-07-31 | Disposition: A | Discharge status: Home / Self Care | Payer: Commercial Managed Care - HMO | Source: Ambulatory Visit | Attending: Radiation Oncology | Admitting: Radiation Oncology

## 2015-07-31 ENCOUNTER — Encounter: Payer: Self-pay | Admitting: Radiation Oncology

## 2015-07-31 VITALS — BP 133/68 | HR 79 | Temp 98.2°F | Ht 64.0 in | Wt 165.9 lb

## 2015-07-31 DIAGNOSIS — C7949 Secondary malignant neoplasm of other parts of nervous system: Secondary | ICD-10-CM | POA: Diagnosis not present

## 2015-07-31 DIAGNOSIS — C7931 Secondary malignant neoplasm of brain: Secondary | ICD-10-CM | POA: Diagnosis not present

## 2015-07-31 NOTE — Progress Notes (Signed)
Ms. Denes presents for follow up of radiation completed 03/01/12 to her Left Occiptal area, and Right Insula. She reports three falls since January. She states she becomes dizzy and will wake up on the floor. She has not injured herself except bruises during these falls. She is receiving Chemotherapy once a month per Dr. Alvy Bimler. She denies headaches. She reports she is not eating well, only once daily with snacks in between. She does drink supplements such ensure or carnation breakfasts daily. She denies any other problems at this time.   BP 133/68 mmHg  Pulse 79  Temp(Src) 98.2 F (36.8 C)  Ht '5\' 4"'$  (1.626 m)  Wt 165 lb 14.4 oz (75.252 kg)  BMI 28.46 kg/m2  SpO2 100%   Wt Readings from Last 3 Encounters:  07/31/15 165 lb 14.4 oz (75.252 kg)  07/15/15 167 lb 9.6 oz (76.023 kg)  06/19/15 164 lb 1.6 oz (74.435 kg)

## 2015-07-31 NOTE — Progress Notes (Addendum)
Radiation Oncology         [phone] ________________________________  Name: Lauren [name] MRN: [id number]  Date: 07/31/2015  DOB: 1951/05/09  Follow-Up Visit Note  Outpatient  CC: MCDIARMID,TODD D, MD  Cletus Gash, MD   Diagnosis and Prior Radiotherapy: Metastatic non-small cell lung cancer, adenocarcinoma, with 2 brain metastases. Left occipital and right insular metastases  Interval Since Last Radiation: She completed 20 Gray in 1 fraction to both of the metastases on 03/01/2012      ICD-9-CM ICD-10-CM   1. Brain metastases treated with radiosurgery 198.3 C79.31     C79.49      Narrative: The patient presenting for her routine follow-up appointment with radiation oncology.  She reports three falls since January. She states she becomes dizzy and will wake up on the floor. She has not injured herself except bruises during these falls. She does not always lose consciousness. Sometimes she has vertigo.  She is receiving Chemotherapy once a month per Dr. Alvy Bimler. She denies headaches. She reports she is not eating well, only once daily with snacks in between.  She does drink supplements such ensure or carnation breakfasts daily. She denies any other problems at this time. No other complaints. CT CAP without evidence of recurrence in December.  MRI brain this month without evidence of recurrence. Discussed at tumor board.   ALLERGIES:  is allergic to adhesive and lisinopril.  Meds: Current Outpatient Prescriptions  Medication Sig Dispense Refill  . albuterol (PROVENTIL HFA;VENTOLIN HFA) 108 (90 BASE) MCG/ACT inhaler Inhale 1 puff into the lungs 2 (two) times daily. 6.7 g prn  . aspirin 81 MG chewable tablet Chew 81 mg by mouth daily.    . clonazePAM (KLONOPIN) 0.5 MG tablet Take 1 tablet (0.5 mg total) by mouth 2 (two) times daily as needed for anxiety. 60 tablet 2  . diclofenac sodium (VOLTAREN) 1 % GEL Apply 2 g topically 4 (four) times daily. As needed for pain 858  g 11  . folic acid (FOLVITE) 1 MG tablet Take 1 tablet (1 mg total) by mouth daily. 90 tablet 3  . furosemide (LASIX) 20 MG tablet Take 1 tablet (20 mg total) by mouth daily. 90 tablet 3  . lidocaine-prilocaine (EMLA) cream Apply topically as needed. Apply to porta cath site one hour prior to needle stick. 30 g 6  . mirtazapine (REMERON) 15 MG tablet Take 1 tablet (15 mg total) by mouth at bedtime. 90 tablet 0  . nebivolol (BYSTOLIC) 10 MG tablet Take 1 tablet (10 mg total) by mouth daily. 90 tablet 4  . nitroGLYCERIN (NITROSTAT) 0.4 MG SL tablet Place 1 tablet (0.4 mg total) under the tongue every 5 (five) minutes as needed. For chest pain 25 tablet 5  . omeprazole (PRILOSEC) 40 MG capsule Take 1 capsule (40 mg total) by mouth daily. 90 capsule 3  . PATADAY 0.2 % SOLN as needed.     . potassium chloride SA (K-DUR,KLOR-CON) 20 MEQ tablet TAKE 1 TABLET TWICE DAILY (Patient taking differently: TAKE 1 TABLET once DAILY) 180 tablet 0  . pravastatin (PRAVACHOL) 40 MG tablet TAKE 1 TABLET BY MOUTH ONCE DAILY 90 tablet 3  . tiZANidine (ZANAFLEX) 2 MG tablet Take 1 tablet (2 mg total) by mouth every 6 (six) hours as needed (Back Muscle Spasm). 30 tablet 3  . traMADol (ULTRAM) 50 MG tablet Take 1 tablet (50 mg total) by mouth every 6 (six) hours as needed for moderate pain or severe pain. 90 tablet 1  .  FLUoxetine (PROZAC) 20 MG tablet Take 1 tablet by mouth daily. Reported on 07/31/2015    . [DISCONTINUED] buPROPion (WELLBUTRIN XL) 150 MG 24 hr tablet Take 1 tablet (150 mg total) by mouth 2 (two) times daily. 60 tablet 5   No current facility-administered medications for this encounter.    Physical Findings: The patient is in no acute distress. Patient is alert and oriented.     height is '5\' 4"'$  (1.626 m) and weight is 165 lb 14.4 oz (75.252 kg). Her temperature is 98.2 F (36.8 C). Her blood pressure is 133/68 and her pulse is 79. Her oxygen saturation is 100%. .   Oral cavity without thrush, no  oropharyngeal lesions Lungs are clear to auscultation bilaterally Heart has regular rate and rhythm with occasional skipped beats Strength is intact and symmetric throughout Finger to nose testing and rapidly alternating movements intact Cranial nerves are intact throughout Ext: ankle edema   Lab Findings: Lab Results  Component Value Date   WBC 1.4* 07/25/2015   HGB 10.4* 07/25/2015   HCT 33.5* 07/25/2015   MCV 83.1 07/25/2015   PLT 115* 07/25/2015      CBC    Component Value Date/Time   WBC 1.4* 07/25/2015 1031   WBC 6.5 06/12/2013 0625   RBC 4.03 07/25/2015 1031   RBC 5.16* 06/12/2013 0625   HGB 10.4* 07/25/2015 1031   HGB 12.9 06/12/2013 0625   HCT 33.5* 07/25/2015 1031   HCT 40.8 06/12/2013 0625   PLT 115* 07/25/2015 1031   PLT 202 06/12/2013 0625   MCV 83.1 07/25/2015 1031   MCV 79.1 06/12/2013 0625   MCH 25.7 07/25/2015 1031   MCH 25.0* 06/12/2013 0625   MCHC 30.9* 07/25/2015 1031   MCHC 31.6 06/12/2013 0625   RDW 17.8* 07/25/2015 1031   RDW 17.3* 06/12/2013 0625   LYMPHSABS 0.6* 07/25/2015 1031   LYMPHSABS 0.8 03/02/2013 0050   MONOABS 0.2 07/25/2015 1031   MONOABS 0.1 03/02/2013 0050   EOSABS 0.0 07/25/2015 1031   EOSABS 0.1 03/02/2013 0050   BASOSABS 0.0 07/25/2015 1031   BASOSABS 0.0 03/02/2013 0050      Radiographic Findings: As above  Impression/Plan:  She is with no evidence of recurrence of disease in the brain. The patient understands the results of her most recent MRI, which were reviewed in detail. Her next MRI will take place in 6 months time. I'll see her soon after imaging.  Patient will see PCP tomorrow to discuss falls, some of which occurred with LOC.  She has some complaints of vertigo as well.  I don't see any explanation for this on her MRI of her brain from a cancer standpoint. 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   _________________   Eppie Gibson, MD

## 2015-08-01 ENCOUNTER — Other Ambulatory Visit: Payer: Self-pay | Admitting: Family Medicine

## 2015-08-01 ENCOUNTER — Ambulatory Visit (INDEPENDENT_AMBULATORY_CARE_PROVIDER_SITE_OTHER): Payer: Commercial Managed Care - HMO | Admitting: Family Medicine

## 2015-08-01 ENCOUNTER — Encounter: Payer: Self-pay | Admitting: Family Medicine

## 2015-08-01 VITALS — BP 145/76 | HR 78 | Temp 98.5°F | Ht 64.0 in | Wt 165.0 lb

## 2015-08-01 DIAGNOSIS — Z7289 Other problems related to lifestyle: Secondary | ICD-10-CM

## 2015-08-01 DIAGNOSIS — Z23 Encounter for immunization: Secondary | ICD-10-CM | POA: Diagnosis not present

## 2015-08-01 DIAGNOSIS — R296 Repeated falls: Secondary | ICD-10-CM | POA: Diagnosis not present

## 2015-08-01 DIAGNOSIS — Z609 Problem related to social environment, unspecified: Secondary | ICD-10-CM

## 2015-08-01 NOTE — Patient Instructions (Addendum)
  Lauren [name] ,  This is a list of the screening recommended for you and due dates:  Health Maintenance  Topic Date Due  .  Hepatitis C: One time screening is recommended by Center for Disease Control  (CDC) for  adults born from [birth year] through 1967.   Tested today  . Colon Cancer Screening  I will contact Dr Ardis Hughs to see if he wants to see you for a colonoscopy  . Tetanus Vaccine  Given today  . Pneumonia vaccines (2 of 2 - PPSV23) 11/08/2015  . Shingles Vaccine  completed     I believe there are multiple issues contributing to your falling  Your thigh muscles are weak.  These muscles are very important in preventing falls if you become imbalanced.  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  Your fear of falling slows your walking speed which makes it easier to tip over.  It is like trying to ride a bike slowly.   Your balance has come to depend greatly on your vision, so keeping your vision the best it can be through eye glasses and watching for glaucoma and cataracts is very important.   I recommend that you participate in Balance rehabilitation at [facility].  They will work on your thigh muscle strength, your balance, and recommend the appropriate aids for walking, like a Rollator or [employer].  They will also adjust the assistive devices to your body and teach you how to use the devices correctly.   We will make an appointment with an ophthalmologist for a dilated eye exam and assessment of the need for a new glasses prescription.

## 2015-08-02 ENCOUNTER — Encounter: Payer: Self-pay | Admitting: Family Medicine

## 2015-08-02 DIAGNOSIS — R296 Repeated falls: Secondary | ICD-10-CM

## 2015-08-02 HISTORY — DX: Repeated falls: R29.6

## 2015-08-02 LAB — HEPATITIS C ANTIBODY: HCV AB: NEGATIVE

## 2015-08-02 NOTE — Progress Notes (Signed)
Subjective:    Patient ID: Lauren [name], female    DOB: March 23, 1951, 65 y.o.   MRN: [id number]  HPI   FALLs  Location: three falls in last 3 moths: Bathroom, kitchen and in yard Activity prior to fall:  1) walking into BR thru door when lost balance and fell into bathtub- shower curtain slowed her fall.  She struck back of her head.  No loss of consciousness, no syncope 2) walking across yard on ground, lost footing and fell backwards onto buttocks.  No syncope, no head trauma.  3) reaching towards a ground shelf when tumbled forward.  DId hit head but without complication.  No loss of consciousness or syncope.  Change in position prior to fall: yes, in kitchen fall Dizziness prior to fall:  no Syncope prior to fall:  no Vertigo:  No, though has had vertigo when occasionally when turning in bed Chest Pain/Palpitations:  no Loss of balance:   yes Slip: no Trip: yes, in yard Slide:  no What Body parts struck object(s) or ground: buttocks and head What object or surface was struck: bathtub, yard ground, kitchen floor Injury: no Associated acute Illness no Medications (new or dose changes):  yes, clonazepam started 07/15/15 Prior Work-up of fall or fall complications: no  Worry about falling:  yes   Vision Difficulty:  yes Independent in ADLs: yes  Independent in I-ADLs: yes  Hx of falls evaluation: no Hx of orthostatic dizziness: no, see orthostatic vitals in extended vitals section Hx of problems with legs (strength, pain):  yes Hx of problems with feet: yes Hx of urgency incontinence:  yes Hx of problems with balance: yes Hx of problems with Gait: yes   Hx of Parkinsons Disease: no  Hx of Strokes: no, but undergoing external brain radiation therapy and has small lesions in insula and occipital lobs.  Old pontine infarct on MRI 07/25/15.  Hx of Cardiac Disease:  yes, LVH G1DD Hx of Cogntive Impairment: no  Hx problems with transfers:  no Hx of WC dependence:   no  Hx of Vitamin D insufficiency: yes, Vit D 25 = 28 (01/2009)  Patient does fear falling or worries about falling She use a  (cane,walker,WC):  yes, one-point cane She does hold onto furtniture with walking in home.  She does use arms to push up with her hands when standing up from chair She does not trouble stepping up on curbs She occasionally has to rush to the toilet to urinate  She decreased sensation in her feet  Clonazepam medications make her lightheaded or tired She clonazepam take medications to fall asleep She denies prolonged sadness or loss of pleasure in life She denies difficulty seeing in day She denies difficulty seeing at night  SH: no smoking    Review of Systems See HPI    Objective:   Physical Exam Orthostatic BP:  See extended vitals section  Wears glasses  HEENT:  Pupils:  2-3 mm bil Visual Fields: full          EOMI: full         Cardiac:  Rate: regular Rhythm:  rrr  Murmur/Gallup/Rub: none  Neuro: Cranial Nerves (II-XI): intact Motor         Strength: 5/5 UE./ LE bilaterally           Tone: normal          Rigidity: no         Cogwheeling: no  Tremor (rest): no          Tremor (action): no Sensation         Temperature: intact legs         Proprioception: intact toes and feet         Monofilament: intact   Cerebellar         Finger-to-Nose: normal         Heel-to-shin: normal          Rhomberg: abnormal - loss of balance with eyes closed.     Gait Timed Up and Go:  Not tested because patient lost balance after about 3 to 4 steps.  30-second Chair Stand Test: 6 repetitions 4-Stage Balance Test (Pass/Fail): Failed at tandem stand  Psych: Speech: clear and prosodic  Language: goal directed and rational   Thought content: reality oriented    Thought Form: structured       Assessment & Plan:  30 minutes face to face where spent in total with counseling / coordination of care took more than 50% of the total time.  Counseling involved discussion of the history and test results. Discussion on diagnosis and natural history of falls,  the need for limiting medications associated with falls and physical therapy for gait and balance training.

## 2015-08-02 NOTE — Assessment & Plan Note (Signed)
New problem Likely multifactorial including: medications (clonazepam), proximal leg muscle weaknesses, impaired balance, peripheral neuropathy secondary to chemotherapy, ?role brain irradiation, slow gait speed.  Recommendations - limit use of clonazepam to as infrequent as possible - Referral for outpatient physical therapy for balance, gait training and recommend/fit/instruct in use of assistive devices for ambulation. - Annual eye exams for refractive errors and monitor for common disorders of aging eye  Patient left before Vitamin D level was checked. Recommend that patient take 800 to 1000 IU Cholecalciferol +/- calcium to decrease risk of falls.  Recommend arranging a home safety evaluation to maximize safety of patient's environment.

## 2015-08-05 ENCOUNTER — Encounter: Payer: Self-pay | Admitting: *Deleted

## 2015-08-06 ENCOUNTER — Other Ambulatory Visit: Payer: Self-pay | Admitting: *Deleted

## 2015-08-06 ENCOUNTER — Encounter: Payer: Self-pay | Admitting: *Deleted

## 2015-08-06 DIAGNOSIS — J441 Chronic obstructive pulmonary disease with (acute) exacerbation: Secondary | ICD-10-CM

## 2015-08-06 NOTE — Patient Outreach (Signed)
[facility]) Care Management  08/06/2015  LORIANN BOSSERMAN 09/30/1950 [id number]   CSW rec'd a new referral from [facility] for transportation needs. CSW spoke with patient by phone who does request and need transportation resources. CSW communicated to patient plans to send her [employer] resources as well as SCAT application by mail. CSW will make contact with patient in the next 1-2 weeks to assist with completion and any questions.  Nat Christen, BSW, MSW, LCSW  Licensed Education officer, environmental Health System  Mailing [address] [place], [place] [address] Physical Address-[address], [place],  [address] Toll Free Main # [phone] Fax # [phone] Cell # [phone]  Fax # [phone]  [email or web address]  [employer]  Discrimination is Against the [employer]. and its subsidiaries comply with applicable Federal civil rights laws and do not discriminate on the basis of race, color, national origin, age, disability, or sex. [facility] do not exclude people or treat them differently because of race, color, national origin, age, disability, or sex.    [employer]. and its subsidiaries provide:  . Free auxiliary aids and services, such as qualified sign language interpreters, video remote interpretation, and written information in other formats to people with disabilities when such auxiliary aids and services are necessary to ensure an equal opportunity to participate. . Free language services to people whose primary language is not English when those services are necessary to provide meaningful access, such as translated documents or oral interpretation.    If you need these services, call [phone] or if you use a TTY, call [phone].   If you believe that [employer]. and its subsidiaries have failed to provide these services or discriminated  in another way on the basis of race, color, national origin, age, disability, or sex, you can file a Tourist information centre manager with:   [place] [address]   If you need help filing a grievance, call [phone] or if you use a TTY, call 711.  You can also file a civil rights complaint with the U.S. Department of Health and Financial controller, Office for Civil Rights electronically through the Office for Civil Rights Complaint Portal, available at OnSiteLending.nl.jsf, or by mail or phone at:   [place]. Department of Health and Human Services  [place], [place], [facility]  [place], [place]  [phone], [phone] (TDD)  Complaint forms are available at CutFunds.si [place]: ATTENTION: If you do not speak English, language assistance services, free of charge, are available to you. Call [phone] (TTY: 546).  Espaol (Spanish): ATENCIN: si habla espaol, tiene a su disposicin servicios gratuitos de asistencia lingstica. Llame al [phone] (TTY: 751). ???? (Chinese): ?????????????????????????????? [phone]?TTY: 711??  Ti?ng Vi?t (Vietnamese): CH : N?u b?n ni Ti?ng Vi?t, c cc d?ch v? h? tr? ngn ng? mi?n ph dnh cho b?n. G?i s? [phone] (TTY: 993).  ??? ([place]): ?? : ???? ????? ?? , ?? ?? ???? ??? ???? ? ???? . [phone] (TTY: 711)??? ??? ???? .  Tagalog (Tagalog - Filipino): PAUNAWA: Kung nagsasalita ka ng Tagalog, maaari kang gumamit ng mga serbisyo ng tulong sa wika nang walang bayad. [place] sa [phone] (TTY: 263).   Reunion): :      ,      .  [phone] (: 937).  Kreyl Ayisyen ([place]): ATANSYON: Si w pale Ethelene Hal, gen svis  d pou lang ki disponib gratis pou ou.  Rele [phone] (TTY: 195).  Fonnie Jarvis Marland KitchenPakistan): ATTENTION : Si vous parlez franais, des services d'aide linguistique vous sont proposs gratuitement. Appelez le [phone] (ATS : 099).  Polski (Polish): UWAGA: Jeeli mwisz po polsku, moesz skorzysta z bezpatnej pomocy jzykowej. Zadzwo pod numer [phone] (TTY: 673).  Portugus ([place]): ATENO: Se fala portugus, encontram-se disponveis servios lingusticos, grtis. Ligue para [phone] (TTY: 735).  Italiano ([place]): ATTENZIONE: In caso la lingua parlata sia l'italiano, sono disponibili servizi di assistenza linguistica gratuiti. Chiamare il numero [phone] (TTY: 196).  Dawayne Patricia ([place]): ACHTUNG: Wenn Sie Deutsch sprechen,  stehen Ihnen kostenlos sprachliche Hilfsdienstleistungen zur [employer]. Rufnummer: [phone] (TTY: 941).   (Arabic): [phone]   .            : .)631 :   (  ??? ([place]): ??????????????????????????????????[phone] ?TTY?711?????????????????  ? (Farsi): [phone]  . ?   ? ?  ? ? ?~ ?  ?    : .??  (TTY: 711)  Din Bizaad ([place]ajo): D77 baa ak0 n7n7zin: D77 saad bee y1n7[ti'go Risa Grill, saad bee 1k1'1n7da'1wo'd66', t'11 Pricilla Loveless n1 h0l=, koj8' h0d77lnih [phone] (TTY:   962).

## 2015-08-06 NOTE — Patient Outreach (Signed)
[facility]) Care Management  08/06/2015  Lauren [name] 04/14/1951 [id number]   Telephone Call from social worker Deitra Mayo to discuss transportation issues and physical therapy.  The social worker had called [name] and patient had stated that the Dr wanted her to have physical therapy but she is having traspotation issues. Patient is having gait issues with multiple falls.  Physician has recommended outpatient therapy for balance and gait training  and recommended patient to be fit and instructed in use of assistaive devices. He has also recommended a home safety evaluation to maximize the safety of patient environment. Patient would like to have the physical therapist to come to her house.  Plan: RN Health Coach will refer patient to community RN for evaluation  [name], BSN, [place] Management RN Health Coach Phone: [facility] complies with applicable Federal civil rights laws and does not discriminate on the basis of race, color, national origin, age, disability, or sex. Espaol (Spanish)  [place] cumple con las leyes federales de derechos civiles aplicables y no discrimina por motivos de raza, color, nacionalidad, edad, discapacidad o sexo.    Ti?ng Vi?t ([place])  [place] tun th? lu?t dn quy?n hi?n hnh c?a Lin bang v khng phn bi?t ?i x? d?a trn ch?ng t?c, mu da, ngu?n g?c qu?c gia, ? tu?i, khuy?t t?t, ho?c gi?i tnh.    (Arabic)    [place]                      .

## 2015-08-07 ENCOUNTER — Encounter: Payer: Self-pay | Admitting: Family Medicine

## 2015-08-07 NOTE — Patient Outreach (Signed)
Lauren [name]) Care Management  08/07/2015  Lauren [name] 10/24/1950 [id number]   Request from Lauren Christen, LCSW to send patient transportation resources. Packet mailed 08/07/2015.  Gwenette Wellons L. Leyan Branden, [facility] Care Management Assistant

## 2015-08-08 ENCOUNTER — Telehealth: Payer: Self-pay | Admitting: Family Medicine

## 2015-08-08 NOTE — Telephone Encounter (Signed)
Ms. Riling is calling regarding her mobility is getting worse.  Have almost fallen twice yesterday and once today.  Daughter is concerned about her going out alone.  Not taking any muscle relaxers anymore.  Please see if her appt with the Phy Therapist can be moved sooner.  She is scheduled to see them on Friday 3/31.

## 2015-08-12 NOTE — Telephone Encounter (Signed)
Spoke with patient and she is fine with keeping her appt on Friday.  She has more falls since last calling and would like discuss aid options after physical therapy. Jazmin Hartsell,CMA

## 2015-08-13 ENCOUNTER — Other Ambulatory Visit: Payer: Self-pay | Admitting: *Deleted

## 2015-08-13 ENCOUNTER — Other Ambulatory Visit: Payer: Self-pay | Admitting: Hematology and Oncology

## 2015-08-13 DIAGNOSIS — C3412 Malignant neoplasm of upper lobe, left bronchus or lung: Secondary | ICD-10-CM

## 2015-08-13 NOTE — Patient Outreach (Signed)
Referral received from telephonic health coach concerning frequent falls and the need for a home safety evaluation.  Call placed to member, this care manager introduced self and purpose of call.  Member is open to have a home assessment done, scheduled for next week.  She state that she is scheduled to start physical therapy for balance and gait training on Friday.  She denies any urgent concerns at this time.  Contact information provided, encouraged to contact with any questions, concerns, or change in schedule.    Valente David, BSN, [place] Management  [facility] Care Manager [phone]

## 2015-08-14 ENCOUNTER — Encounter: Payer: Self-pay | Admitting: Hematology and Oncology

## 2015-08-14 ENCOUNTER — Other Ambulatory Visit: Payer: Self-pay | Admitting: *Deleted

## 2015-08-14 ENCOUNTER — Other Ambulatory Visit (HOSPITAL_BASED_OUTPATIENT_CLINIC_OR_DEPARTMENT_OTHER): Payer: Commercial Managed Care - HMO

## 2015-08-14 ENCOUNTER — Ambulatory Visit: Payer: Commercial Managed Care - HMO | Admitting: Radiation Oncology

## 2015-08-14 ENCOUNTER — Ambulatory Visit: Payer: Self-pay | Admitting: *Deleted

## 2015-08-14 ENCOUNTER — Inpatient Hospital Stay
Admission: RE | Admit: 2015-08-14 | Payer: Commercial Managed Care - HMO | Source: Ambulatory Visit | Admitting: Radiation Oncology

## 2015-08-14 ENCOUNTER — Ambulatory Visit: Payer: Commercial Managed Care - HMO

## 2015-08-14 ENCOUNTER — Telehealth: Payer: Self-pay | Admitting: Hematology and Oncology

## 2015-08-14 ENCOUNTER — Ambulatory Visit (HOSPITAL_BASED_OUTPATIENT_CLINIC_OR_DEPARTMENT_OTHER): Payer: Commercial Managed Care - HMO

## 2015-08-14 ENCOUNTER — Ambulatory Visit (HOSPITAL_BASED_OUTPATIENT_CLINIC_OR_DEPARTMENT_OTHER): Payer: Commercial Managed Care - HMO | Admitting: Hematology and Oncology

## 2015-08-14 VITALS — BP 149/64 | HR 75 | Temp 98.2°F | Resp 18 | Ht 64.0 in | Wt 165.1 lb

## 2015-08-14 DIAGNOSIS — C7949 Secondary malignant neoplasm of other parts of nervous system: Secondary | ICD-10-CM

## 2015-08-14 DIAGNOSIS — C7931 Secondary malignant neoplasm of brain: Secondary | ICD-10-CM

## 2015-08-14 DIAGNOSIS — Z95828 Presence of other vascular implants and grafts: Secondary | ICD-10-CM

## 2015-08-14 DIAGNOSIS — C3412 Malignant neoplasm of upper lobe, left bronchus or lung: Secondary | ICD-10-CM

## 2015-08-14 DIAGNOSIS — F17200 Nicotine dependence, unspecified, uncomplicated: Secondary | ICD-10-CM

## 2015-08-14 DIAGNOSIS — D6481 Anemia due to antineoplastic chemotherapy: Secondary | ICD-10-CM | POA: Diagnosis not present

## 2015-08-14 DIAGNOSIS — G620 Drug-induced polyneuropathy: Secondary | ICD-10-CM | POA: Diagnosis not present

## 2015-08-14 DIAGNOSIS — T451X5A Adverse effect of antineoplastic and immunosuppressive drugs, initial encounter: Secondary | ICD-10-CM

## 2015-08-14 DIAGNOSIS — Z5111 Encounter for antineoplastic chemotherapy: Secondary | ICD-10-CM | POA: Diagnosis not present

## 2015-08-14 DIAGNOSIS — Z720 Tobacco use: Secondary | ICD-10-CM

## 2015-08-14 LAB — COMPREHENSIVE METABOLIC PANEL
ALT: <9 U/L (ref 0–55)
AST: 13 U/L (ref 5–34)
Albumin: 2.8 g/dL — ABNORMAL LOW (ref 3.5–5.0)
Alkaline Phosphatase: 110 U/L (ref 40–150)
Anion Gap: 10 mEq/L (ref 3–11)
BUN: 9.9 mg/dL (ref 7.0–26.0)
CHLORIDE: 107 meq/L (ref 98–109)
CO2: 26 meq/L (ref 22–29)
Calcium: 8.9 mg/dL (ref 8.4–10.4)
Creatinine: 1.2 mg/dL — ABNORMAL HIGH (ref 0.6–1.1)
EGFR: 55 mL/min/{1.73_m2} — AB (ref >90)
GLUCOSE: 100 mg/dL (ref 70–140)
POTASSIUM: 3.6 meq/L (ref 3.5–5.1)
SODIUM: 143 meq/L (ref 136–145)
Total Bilirubin: 0.3 mg/dL (ref 0.20–1.20)
Total Protein: 6.9 g/dL (ref 6.4–8.3)

## 2015-08-14 LAB — CBC WITH DIFFERENTIAL/PLATELET
BASO%: 0.4 % (ref 0.0–2.0)
Basophils Absolute: 0 10e3/uL (ref 0.0–0.1)
EOS%: 1.1 % (ref 0.0–7.0)
Eosinophils Absolute: 0.1 10e3/uL (ref 0.0–0.5)
HCT: 36.1 % (ref 34.8–46.6)
HGB: 11.2 g/dL — ABNORMAL LOW (ref 11.6–15.9)
LYMPH%: 12 % — ABNORMAL LOW (ref 14.0–49.7)
MCH: 26.2 pg (ref 25.1–34.0)
MCHC: 31 g/dL — ABNORMAL LOW (ref 31.5–36.0)
MCV: 84.3 fL (ref 79.5–101.0)
MONO#: 0.6 10e3/uL (ref 0.1–0.9)
MONO%: 9.8 % (ref 0.0–14.0)
NEUT#: 4.7 10e3/uL (ref 1.5–6.5)
NEUT%: 76.7 % (ref 38.4–76.8)
Platelets: 222 10e3/uL (ref 145–400)
RBC: 4.28 10e6/uL (ref 3.70–5.45)
RDW: 18 % — ABNORMAL HIGH (ref 11.2–14.5)
WBC: 6.2 10e3/uL (ref 3.9–10.3)
lymph#: 0.7 10e3/uL — ABNORMAL LOW (ref 0.9–3.3)

## 2015-08-14 MED ORDER — DEXAMETHASONE SODIUM PHOSPHATE 100 MG/10ML IJ SOLN
Freq: Once | INTRAMUSCULAR | Status: AC
  Administered 2015-08-14: 11:00:00 via INTRAVENOUS
  Filled 2015-08-14: qty 4

## 2015-08-14 MED ORDER — SODIUM CHLORIDE 0.9 % IV SOLN
485.0000 mg/m2 | Freq: Once | INTRAVENOUS | Status: AC
  Administered 2015-08-14: 900 mg via INTRAVENOUS
  Filled 2015-08-14: qty 32

## 2015-08-14 MED ORDER — SODIUM CHLORIDE 0.9 % IJ SOLN
10.0000 mL | INTRAMUSCULAR | Status: DC | PRN
  Administered 2015-08-14: 10 mL
  Filled 2015-08-14: qty 10

## 2015-08-14 MED ORDER — SODIUM CHLORIDE 0.9 % IV SOLN
Freq: Once | INTRAVENOUS | Status: AC
  Administered 2015-08-14: 11:00:00 via INTRAVENOUS

## 2015-08-14 MED ORDER — SODIUM CHLORIDE 0.9% FLUSH
10.0000 mL | INTRAVENOUS | Status: DC | PRN
  Administered 2015-08-14: 10 mL via INTRAVENOUS
  Filled 2015-08-14: qty 10

## 2015-08-14 MED ORDER — HEPARIN SOD (PORK) LOCK FLUSH 100 UNIT/ML IV SOLN
500.0000 [IU] | Freq: Once | INTRAVENOUS | Status: AC | PRN
  Administered 2015-08-14: 500 [IU]
  Filled 2015-08-14: qty 5

## 2015-08-14 NOTE — Progress Notes (Signed)
[facility] OFFICE PROGRESS NOTE  Patient Care Team: [name], MD as PCP - General (Family Medicine) Gaye Pollack, MD (Cardiothoracic Surgery) Thea Silversmith, MD (Radiation Oncology) Dickie La, MD (Family Medicine) Heath Lark, MD as Consulting Physician (Hematology and Oncology) Renella Cunas, MD as Consulting Physician (Cardiology) Greg Cutter, LCSW as [place] Management (Licensed Clinical Social Worker) Verlin Grills, RN as [place] (Licensed Clinical Social Worker) Deirdre Peer, LCSW as [place] (Licensed Clinical Social Worker) Francis Gaines, LCSW as [place] (Licensed Clinical Social Worker) Valente David, Therapist, sports as [facility]  SUMMARY OF ONCOLOGIC HISTORY: Oncology History   Colon cancer   Primary site: Colon and Rectum (Left)   Staging method: AJCC 7th Edition   Clinical: Stage I (T2, N0, M0) signed by Heath Lark, MD on 05/31/2013  2:39 PM   Pathologic: Stage I (T2, N0, cM0) signed by Heath Lark, MD on 05/31/2013  2:39 PM   Summary: Stage I (T2, N0, cM0) Lung cancer, EGFR/ALK negative, recurrence after initial resection to LN and brain   Primary site: Lung (Left)   Staging method: AJCC 7th Edition   Clinical: Stage IV (T1, N2, M1b) signed by Heath Lark, MD on 05/31/2013  2:26 PM   Pathologic: Stage IV (T1, N2, M1b) signed by Heath Lark, MD on 05/31/2013  2:26 PM   Summary: Stage IV (T1, N2, M1b)       Cancer of upper lobe of left lung, Adenocarcinoma   03/01/2007 Procedure Colonoscopy revealed abnormalities and biopsy show high-grade dysplasia   04/01/2007 Surgery She underwent sigmoid resection which showed T2 N0 colon cancer, and negative margins and all of 17 lymph nodes were negative   02/29/2008 Procedure Repeat surveillance colonoscopy was negative.   06/16/2010 Surgery She underwent left  upper lobectomy we show well-differentiated adenocarcinoma of the lung, T1, N0, M0   03/18/2011 Procedure Repeat colonoscopy show multiple polyps but there were benign   10/19/2011 Procedure Biopsy of mediastinal lymph node came back positive for recurrence of lung cancer, EGFR and ALK negative   11/16/2011 - 12/14/2011 Chemotherapy She received concurrent chemoradiation therapy with weekly carboplatin and Taxol.   11/16/2011 - 12/29/2011 Radiation Therapy She received radiation therapy with weekly chemotherapy   02/15/2012 Imaging MR of the brain showed a new intracranial metastases. This was subsequently treated with stereotactic radiosurgery.   03/07/2012 - 04/12/2013 Chemotherapy She received chemotherapy with maintainence Alimta every 3 weeks. Chemotherapy was discontinued due to profound fatigue   03/02/2013 Procedure She had therapeutic ultrasound guidance thoracentesis for pleural effusion that came back negative for cancer   05/04/2013 Procedure She had repeat ultrasound-guided thoracentesis again and cytology was negative   05/29/2013 Imaging Repeat CT scan of the chest, abdomen and pelvis show no evidence of disease but persistent right-sided pleural effusion   06/02/2013 Surgery The patient had placement of Pleurx catheter and subsequently underwent pleurodesis.   09/22/2013 Imaging Repeat CT scan show no evidence of active disease. There are nonspecific lymphadenopathy and she is placed on observation.   03/23/2014 Imaging Repeat CT scan of the chest, abdomen and pelvis show recurrence of cancer with widespread bilateral pulmonary metastasis.   05/14/2014 Imaging Imaging of the chest and brain were repeated due to delay of initiation of chemotherapy. Overall, chest CT scan show stable disease. MRI of the head was negative for recurrence   05/16/2014 - 07/18/2014  Chemotherapy  she completed 4 cycles of combination chemotherapy with carboplatin and Alimta   07/16/2014 Imaging  repeat CT scan of the chest,  abdomen and pelvis show regression in the size of lung nodules.   10/16/2014 Imaging  repeat CT scan of the chest, abdomen and pelvis show regression in the size of lung nodules.   01/29/2015 Imaging Repeat CT scan showed stable disease   02/01/2015 Imaging MRI brain is negative   04/30/2015 Imaging CT scan of the chest, abdomen and pelvis showed stable disease    Brain metastases treated with radiosurgery   02/26/2012 Initial Diagnosis Brain metastases treated with radiosurgery    INTERVAL HISTORY: Please see below for problem oriented charting. She is seen today prior to chemotherapy. She saw her primary care doctor recently for fatigue and dizziness. Recent MRI showed no evidence of cancer. She had repeated episodes of falls at home but no recent injuries. She continues to smoke. Denies recent infection. No recent cough or chest pain.   REVIEW OF SYSTEMS:   Constitutional: Denies fevers, chills or abnormal weight loss Eyes: Denies blurriness of vision Ears, nose, mouth, throat, and face: Denies mucositis or sore throat Respiratory: Denies cough, dyspnea or wheezes Cardiovascular: Denies palpitation, chest discomfort or lower extremity swelling Gastrointestinal:  Denies nausea, heartburn or change in bowel habits Skin: Denies abnormal skin rashes Lymphatics: Denies new lymphadenopathy or easy bruising Neurological:Denies numbness, tingling or new weaknesses Behavioral/Psych: Mood is stable, no new changes  All other systems were reviewed with the patient and are negative.  I have reviewed the past medical history, past surgical history, social history and family history with the patient and they are unchanged from previous note.  ALLERGIES:  is allergic to adhesive and lisinopril.  MEDICATIONS:  Current Outpatient Prescriptions  Medication Sig Dispense Refill  . albuterol (PROVENTIL HFA;VENTOLIN HFA) 108 (90 BASE) MCG/ACT inhaler Inhale 1 puff into the lungs 2 (two) times daily.  (Patient taking differently: Inhale 2 puffs into the lungs every 6 (six) hours as needed. ) 6.7 g prn  . aspirin 81 MG chewable tablet Chew 81 mg by mouth daily.    . clonazePAM (KLONOPIN) 0.5 MG tablet Take 1 tablet (0.5 mg total) by mouth 2 (two) times daily as needed for anxiety. 60 tablet 2  . diclofenac sodium (VOLTAREN) 1 % GEL Apply 2 g topically 4 (four) times daily. As needed for pain 100 g 11  . FLUoxetine (PROZAC) 20 MG tablet Take 1 tablet by mouth daily. Reported on 08/14/5186    . folic acid (FOLVITE) 1 MG tablet Take 1 tablet (1 mg total) by mouth daily. 90 tablet 3  . furosemide (LASIX) 20 MG tablet Take 1 tablet (20 mg total) by mouth daily. 90 tablet 3  . lidocaine-prilocaine (EMLA) cream Apply topically as needed. Apply to porta cath site one hour prior to needle stick. 30 g 6  . nebivolol (BYSTOLIC) 10 MG tablet Take 1 tablet (10 mg total) by mouth daily. 90 tablet 4  . nitroGLYCERIN (NITROSTAT) 0.4 MG SL tablet Place 1 tablet (0.4 mg total) under the tongue every 5 (five) minutes as needed. For chest pain 25 tablet 5  . omeprazole (PRILOSEC) 40 MG capsule Take 1 capsule (40 mg total) by mouth daily. 90 capsule 3  . potassium chloride SA (K-DUR,KLOR-CON) 20 MEQ tablet TAKE 1 TABLET TWICE DAILY (Patient taking differently: TAKE 1 TABLET once DAILY) 180 tablet 0  . pravastatin (PRAVACHOL) 40 MG tablet TAKE 1 TABLET BY MOUTH ONCE  DAILY 90 tablet 3  . tiZANidine (ZANAFLEX) 2 MG tablet Take 1 tablet (2 mg total) by mouth every 6 (six) hours as needed (Back Muscle Spasm). 30 tablet 3  . traMADol (ULTRAM) 50 MG tablet Take 1 tablet (50 mg total) by mouth every 6 (six) hours as needed for moderate pain or severe pain. 90 tablet 1  . umeclidinium-vilanterol (ANORO ELLIPTA) 62.5-25 MCG/INH AEPB Inhale 1 puff into the lungs daily.    . [DISCONTINUED] buPROPion (WELLBUTRIN XL) 150 MG 24 hr tablet Take 1 tablet (150 mg total) by mouth 2 (two) times daily. 60 tablet 5   No current  facility-administered medications for this visit.    PHYSICAL EXAMINATION: ECOG PERFORMANCE STATUS: 1 - Symptomatic but completely ambulatory  Filed Vitals:   08/14/15 0952  BP: 149/64  Pulse: 75  Temp: 98.2 F (36.8 C)  Resp: 18   Filed Weights   08/14/15 0952  Weight: 165 lb 1.6 oz (74.889 kg)    GENERAL:alert, no distress and comfortable SKIN: skin color, texture, turgor are normal, no rashes or significant lesions EYES: normal, Conjunctiva are pink and non-injected, sclera clear OROPHARYNX:no exudate, no erythema and lips, buccal mucosa, and tongue normal  NECK: supple, thyroid normal size, non-tender, without nodularity LYMPH:  no palpable lymphadenopathy in the cervical, axillary or inguinal LUNGS: clear to auscultation and percussion with normal breathing effort HEART: regular rate & rhythm and no murmurs and no lower extremity edema ABDOMEN:abdomen soft, non-tender and normal bowel sounds Musculoskeletal:no cyanosis of digits and no clubbing  NEURO: alert & oriented x 3 with fluent speech, no focal motor/sensory deficits  LABORATORY DATA:  I have reviewed the data as listed    Component Value Date/Time   NA 140 07/25/2015 1031   NA 141 12/26/2013 0946   NA 143 09/28/2011 0833   K 4.1 07/25/2015 1031   K 4.0 12/26/2013 0946   K 3.5 09/28/2011 0833   CL 106 12/26/2013 0946   CL 102 10/24/2012 1001   CL 98 09/28/2011 0833   CO2 25 07/25/2015 1031   CO2 27 12/26/2013 0946   CO2 30 09/28/2011 0833   GLUCOSE 87 07/25/2015 1031   GLUCOSE 101* 12/26/2013 0946   GLUCOSE 111* 10/24/2012 1001   GLUCOSE 112 09/28/2011 0833   BUN 16.9 07/25/2015 1031   BUN 9 12/26/2013 0946   BUN 9 09/28/2011 0833   CREATININE 1.1 07/25/2015 1031   CREATININE 0.83 12/26/2013 0946   CREATININE 0.7 09/28/2011 0833   CALCIUM 8.6 07/25/2015 1031   CALCIUM 8.8 12/26/2013 0946   CALCIUM 8.7 09/28/2011 0833   PROT 6.8 07/25/2015 1031   PROT 6.8 12/26/2013 0946   PROT 7.2 09/28/2011  0833   ALBUMIN 2.8* 07/25/2015 1031   ALBUMIN 3.8 12/26/2013 0946   ALBUMIN 3.2* 09/28/2011 0833   AST 25 07/25/2015 1031   AST 12 12/26/2013 0946   AST 15 09/28/2011 0833   ALT 27 07/25/2015 1031   ALT <8 12/26/2013 0946   ALT 16 09/28/2011 0833   ALKPHOS 100 07/25/2015 1031   ALKPHOS 148* 12/26/2013 0946   ALKPHOS 99* 09/28/2011 0833   BILITOT 0.42 07/25/2015 1031   BILITOT 0.6 12/26/2013 0946   BILITOT 0.80 09/28/2011 0833   GFRNONAA 74* 06/12/2013 0625   GFRAA 86* 06/12/2013 0625    No results found for: SPEP, UPEP  Lab Results  Component Value Date   WBC 6.2 08/14/2015   NEUTROABS 4.7 08/14/2015   HGB 11.2* 08/14/2015   HCT 36.1  08/14/2015   MCV 84.3 08/14/2015   PLT 222 08/14/2015      Chemistry      Component Value Date/Time   NA 140 07/25/2015 1031   NA 141 12/26/2013 0946   NA 143 09/28/2011 0833   K 4.1 07/25/2015 1031   K 4.0 12/26/2013 0946   K 3.5 09/28/2011 0833   CL 106 12/26/2013 0946   CL 102 10/24/2012 1001   CL 98 09/28/2011 0833   CO2 25 07/25/2015 1031   CO2 27 12/26/2013 0946   CO2 30 09/28/2011 0833   BUN 16.9 07/25/2015 1031   BUN 9 12/26/2013 0946   BUN 9 09/28/2011 0833   CREATININE 1.1 07/25/2015 1031   CREATININE 0.83 12/26/2013 0946   CREATININE 0.7 09/28/2011 0833      Component Value Date/Time   CALCIUM 8.6 07/25/2015 1031   CALCIUM 8.8 12/26/2013 0946   CALCIUM 8.7 09/28/2011 0833   ALKPHOS 100 07/25/2015 1031   ALKPHOS 148* 12/26/2013 0946   ALKPHOS 99* 09/28/2011 0833   AST 25 07/25/2015 1031   AST 12 12/26/2013 0946   AST 15 09/28/2011 0833   ALT 27 07/25/2015 1031   ALT <8 12/26/2013 0946   ALT 16 09/28/2011 0833   BILITOT 0.42 07/25/2015 1031   BILITOT 0.6 12/26/2013 0946   BILITOT 0.80 09/28/2011 0833      ASSESSMENT & PLAN:  Cancer of upper lobe of left lung, Adenocarcinoma  Remarkably, her CT scan showed no evidence of active disease. She have responded very well with treatment. However, she developed  significant fatigue.  We discussed the risks, benefit, side effects of continuing with treatment. Previously, when I gave her a treatment holiday, her disease relapsed significantly. We also discussed other treatment options including the use of Opdivo/Keytruda  after very prolonged discussion, with any agreement to restart treatment next month but to lengthen her treatment interval to once a month She tolerated that well and we will continue to maintenance treatment with Alimta once every 28 days We will continue treatment the same today and I plan to order a CT scan of the chest for evaluation next month before her next treatment.  Brain metastases treated with radiosurgery Her most recent MRI showed no evidence of disease recurrence. Continue to monitor carefully. She had recent dizziness but no focal neurological deficits or headache.   TOBACCO ABUSE I spent some time counseling the patient the importance of tobacco cessation. She is currently not interested to quit now.     Anemia due to chemotherapy This is likely due to recent treatment. The patient denies recent history of bleeding such as epistaxis, hematuria or hematochezia. She is asymptomatic from the anemia. I will observe for now.  She does not require transfusion now. I will continue the chemotherapy at current dose without dosage adjustment.  If the anemia gets progressive worse in the future, I might have to delay her treatment or adjust the chemotherapy dose.   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 This was probably predisposed by sciatica on the left foot. Observe only for now. Currently chemotherapy would not cause peripheral neuropathy She has been referred to PT for balance issue   Orders Placed This Encounter  Procedures  . CT Chest W Contrast    Standing Status: Future     Number of Occurrences:      Standing Expiration Date: 10/13/2016    Order Specific Question:   Reason for Exam (SYMPTOM  OR DIAGNOSIS REQUIRED)    Answer:  staging lung cancer, assess response to Rx    Order Specific Question:  Preferred imaging location?    Answer:  [facility]   All questions were answered. The patient knows to call the clinic with any problems, questions or concerns. No barriers to learning was detected. I spent 25 minutes counseling the patient face to face. The total time spent in the appointment was 30 minutes and more than 50% was on counseling and review of test results     [facility], [place], MD 08/14/2015 10:24 AM

## 2015-08-14 NOTE — Patient Outreach (Signed)
[facility]) Care Management  08/14/2015  HARMONEY SIENKIEWICZ 1951-04-16 [id number]  Initial telephone call from patient. RN Health Coach returned call. Patient had her chemotherapy today and is doing good. She is trying to get her papers filled out for [employer] transportation and is trying to get assistance. RN informed her to call her social worker that sent her the papers.  [facility] Care Management [phone]

## 2015-08-14 NOTE — Telephone Encounter (Signed)
Gave and printed appt sched and avs for pt for March. °

## 2015-08-14 NOTE — Patient Instructions (Signed)

## 2015-08-14 NOTE — Assessment & Plan Note (Signed)
Her most recent MRI showed no evidence of disease recurrence. Continue to monitor carefully. She had recent dizziness but no focal neurological deficits or headache.

## 2015-08-14 NOTE — Assessment & Plan Note (Signed)
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 This was probably predisposed by sciatica on the left foot. Observe only for now. Currently chemotherapy would not cause peripheral neuropathy She has been referred to PT for balance issue

## 2015-08-14 NOTE — Assessment & Plan Note (Signed)

## 2015-08-14 NOTE — Assessment & Plan Note (Signed)
I spent some time counseling the patient the importance of tobacco cessation. She is currently not interested to quit now. 

## 2015-08-14 NOTE — Assessment & Plan Note (Signed)
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 She have responded very well with treatment. However, she developed significant fatigue.  We discussed the risks, benefit, side effects of continuing with treatment. Previously, when I gave her a treatment holiday, her disease relapsed significantly. We also discussed other treatment options including the use of Opdivo/Keytruda  after very prolonged discussion, with any agreement to restart treatment next month but to lengthen her treatment interval to once a month She tolerated that well and we will continue to maintenance treatment with Alimta once every 28 days We will continue treatment the same today and I plan to order a CT scan of the chest for evaluation next month before her next treatment.

## 2015-08-14 NOTE — Patient Instructions (Signed)
[facility] Discharge Instructions for Patients Receiving Chemotherapy  Today you received the following chemotherapy agents Alimta.  To help prevent nausea and vomiting after your treatment, we encourage you to take your nausea medication as prescribed.   If you develop nausea and vomiting that is not controlled by your nausea medication, call the clinic.   BELOW ARE SYMPTOMS THAT SHOULD BE REPORTED IMMEDIATELY:  *FEVER GREATER THAN 100.5 F  *CHILLS WITH OR WITHOUT FEVER  NAUSEA AND VOMITING THAT IS NOT CONTROLLED WITH YOUR NAUSEA MEDICATION  *UNUSUAL SHORTNESS OF BREATH  *UNUSUAL BRUISING OR BLEEDING  TENDERNESS IN MOUTH AND THROAT WITH OR WITHOUT PRESENCE OF ULCERS  *URINARY PROBLEMS  *BOWEL PROBLEMS  UNUSUAL RASH Items with * indicate a potential emergency and should be followed up as soon as possible.  Feel free to call the clinic you have any questions or concerns. The clinic phone number is [phone].  Please show the CHEMO ALERT CARD at check-in to the Emergency Department and triage nurse.   

## 2015-08-15 ENCOUNTER — Encounter: Payer: Self-pay | Admitting: Hematology and Oncology

## 2015-08-15 ENCOUNTER — Ambulatory Visit: Payer: Self-pay

## 2015-08-15 ENCOUNTER — Ambulatory Visit (INDEPENDENT_AMBULATORY_CARE_PROVIDER_SITE_OTHER): Payer: Commercial Managed Care - HMO | Admitting: Family Medicine

## 2015-08-15 ENCOUNTER — Other Ambulatory Visit: Payer: Self-pay | Admitting: Nurse Practitioner

## 2015-08-15 ENCOUNTER — Other Ambulatory Visit: Payer: Self-pay | Admitting: Hematology and Oncology

## 2015-08-15 VITALS — BP 120/70 | HR 70 | Temp 97.5°F | Ht 64.0 in | Wt 164.0 lb

## 2015-08-15 DIAGNOSIS — G3184 Mild cognitive impairment, so stated: Secondary | ICD-10-CM

## 2015-08-15 DIAGNOSIS — M810 Age-related osteoporosis without current pathological fracture: Secondary | ICD-10-CM

## 2015-08-15 DIAGNOSIS — W19XXXD Unspecified fall, subsequent encounter: Secondary | ICD-10-CM

## 2015-08-15 DIAGNOSIS — R296 Repeated falls: Secondary | ICD-10-CM

## 2015-08-15 DIAGNOSIS — H8111 Benign paroxysmal vertigo, right ear: Secondary | ICD-10-CM | POA: Diagnosis not present

## 2015-08-15 NOTE — Patient Instructions (Addendum)
Start taking Vitamin D tablets each day to decrease the risk of falling.  You should take a 1000 IU tablet of Vitamin D every day.  You can get it from the over-the-counter.    Consider trying Capsaicin Cream for the burning in your feet.  It is Over-the-Counter.  Ask your pharmacist to help you find it on the store shelves.  Apply the cream four times a day to your feet.  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  It takes two to four weeks of applying the cream for it to work its best.   If this cream is not working well enough for your feet pain, let Dr Welden Hausmann know.  There is medicines you can take to help decrease your peripheral neuropathy pain in your feet.    The spinning feeling in your head appears to be a common condition of older adults called Benign Positional Vertigo.  We tried a maneuver today called the Epley maneuver to help reposition the stone that broke off in your inner ear.  It is that tumbling stone in your inner ear that makes it look and feel like the world is spinning around you.   The physical therapists can help you learn how to reposition those inner ear stones on your own.     Benign Positional Vertigo Vertigo is the feeling that you or your surroundings are moving when they are not. Benign positional vertigo is the most common form of vertigo. The cause of this condition is not serious (is benign). This condition is triggered by certain movements and positions (is positional). This condition can be dangerous if it occurs while you are doing something that could endanger you or others, such as driving.  CAUSES In many cases, the cause of this condition is not known. It may be caused by a disturbance in an area of the inner ear that helps your brain to sense movement and balance. This disturbance can be caused by a viral infection (labyrinthitis), head injury, or repetitive motion. RISK FACTORS This condition is more likely to  develop in:  Women.  People who are 27 years of age or older. SYMPTOMS Symptoms of this condition usually happen when you move your head or your eyes in different directions. Symptoms may start suddenly, and they usually last for less than a minute. Symptoms may include:  Loss of balance and falling.  Feeling like you are spinning or moving.  Feeling like your surroundings are spinning or moving.  Nausea and vomiting.  Blurred vision.  Dizziness.  Involuntary eye movement (nystagmus). Symptoms can be mild and cause only slight annoyance, or they can be severe and interfere with daily life. Episodes of benign positional vertigo may return (recur) over time, and they may be triggered by certain movements. Symptoms may improve over time. DIAGNOSIS This condition is usually diagnosed by medical history and a physical exam of the head, neck, and ears. You may be referred to a health care provider who specializes in ear, nose, and throat (ENT) problems (otolaryngologist) or a provider who specializes in disorders of the nervous system (neurologist). You may have additional testing, including:  MRI.  A CT scan.  Eye movement tests. Your health care provider may ask you to change positions quickly while he or she watches you for symptoms of benign positional vertigo, such as nystagmus. Eye movement may be tested with an electronystagmogram (ENG), caloric stimulation, the Dix-Hallpike test, or the roll test.  An electroencephalogram (EEG). This records electrical activity in your brain.  Hearing tests. TREATMENT Usually, your health care provider will treat this by moving your head in specific positions to adjust your inner ear back to normal. Surgery may be needed in severe cases, but this is rare. In some cases, benign positional vertigo may resolve on its own in 2-4 weeks. HOME CARE INSTRUCTIONS Safety  Move slowly.Avoid sudden body or head movements.  Avoid driving.  Avoid  operating heavy machinery.  Avoid doing any tasks that would be dangerous to you or others if a vertigo episode would occur.  If you have trouble walking or keeping your balance, try using a cane for stability. If you feel dizzy or unstable, sit down right away.  Return to your normal activities as told by your health care provider. Ask your health care provider what activities are safe for you. General Instructions  Take over-the-counter and prescription medicines only as told by your health care provider.  Avoid certain positions or movements as told by your health care provider.  Drink enough fluid to keep your urine clear or pale yellow.  Keep all follow-up visits as told by your health care provider. This is important. SEEK MEDICAL CARE IF:  You have a fever.  Your condition gets worse or you develop new symptoms.  Your family or friends notice any behavioral changes.  Your nausea or vomiting gets worse.  You have numbness or a "pins and needles" sensation. SEEK IMMEDIATE MEDICAL CARE IF:  You have difficulty speaking or moving.  You are always dizzy.  You faint.  You develop severe headaches.  You have weakness in your legs or arms.  You have changes in your hearing or vision.  You develop a stiff neck.  You develop sensitivity to light.     Peripheral Neuropathy Peripheral neuropathy is a type of nerve damage. It affects nerves that carry signals between the spinal cord and other parts of the body. These are called peripheral nerves. With peripheral neuropathy, one nerve or a group of nerves may be damaged.  CAUSES  Many things can damage peripheral nerves. For some people with peripheral neuropathy, the cause is unknown. Some causes include:  Diabetes. This is the most common cause of peripheral neuropathy.  Injury to a nerve.  Pressure or stress on a nerve that lasts a long time.  Too little vitamin B. Alcoholism can lead to  this.  Infections.  Autoimmune diseases, such as multiple sclerosis and systemic lupus erythematosus.  Inherited nerve diseases.  Some medicines, such as cancer drugs.  Toxic substances, such as lead and mercury.  Too little blood flowing to the legs.  Kidney disease.  Thyroid disease. SIGNS AND SYMPTOMS  Different people have different symptoms. The symptoms you have will depend on which of your nerves is damaged. Common symptoms include:  Loss of feeling (numbness) in the feet and hands.  Tingling in the feet and hands.  Pain that burns.  Very sensitive skin.  Weakness.  Not being able to move a part of the body (paralysis).  Muscle twitching.  Clumsiness or poor coordination.  Loss of balance.  Not being able to control your bladder.  Feeling dizzy.  Sexual problems. DIAGNOSIS  Peripheral neuropathy is a symptom, not a disease. Finding the cause of peripheral neuropathy can be hard. To figure that out, your health care provider will take a medical history and do a physical exam. A neurological exam will also be done. This involves checking  things affected by your brain, spinal cord, and nerves (nervous system). For example, your health care provider will check your reflexes, how you move, and what you can feel.  Other types of tests may also be ordered, such as:  Blood tests.  A test of the fluid in your spinal cord.  Imaging tests, such as CT scans or an MRI.  Electromyography (EMG). This test checks the nerves that control muscles.  Nerve conduction velocity tests. These tests check how fast messages pass through your nerves.  Nerve biopsy. A small piece of nerve is removed. It is then checked under a microscope. TREATMENT   Medicine is often used to treat peripheral neuropathy. Medicines may include:  Pain-relieving medicines. Prescription or over-the-counter medicine may be suggested.  Antiseizure medicine. This may be used for  pain.  Antidepressants. These also may help ease pain from neuropathy.  Lidocaine. This is a numbing medicine. You might wear a patch or be given a shot.  Mexiletine. This medicine is typically used to help control irregular heart rhythms.  Surgery. Surgery may be needed to relieve pressure on a nerve or to destroy a nerve that is causing pain.  Physical therapy to help movement.  Assistive devices to help movement. HOME CARE INSTRUCTIONS   Only take over-the-counter or prescription medicines as directed by your health care provider. Follow the instructions carefully for any given medicines. Do not take any other medicines without first getting approval from your health care provider.  If you have diabetes, work closely with your health care provider to keep your blood sugar under control.  If you have numbness in your feet:  Check every day for signs of injury or infection. Watch for redness, warmth, and swelling.  Wear padded socks and comfortable shoes. These help protect your feet.  Do not do things that put pressure on your damaged nerve.  Do not smoke. Smoking keeps blood from getting to damaged nerves.  Avoid or limit alcohol. Too much alcohol can cause a lack of B vitamins. These vitamins are needed for healthy nerves.  Develop a good support system. Coping with peripheral neuropathy can be stressful. Talk to a mental health specialist or join a support group if you are struggling.  Follow up with your health care provider as directed. SEEK MEDICAL CARE IF:   You have new signs or symptoms of peripheral neuropathy.  You are struggling emotionally from dealing with peripheral neuropathy.  You have a fever. SEEK IMMEDIATE MEDICAL CARE IF:   You have an injury or infection that is not healing.  You feel very dizzy or begin vomiting.  You have chest pain.  You have trouble breathing.   This information is not intended to replace advice given to you by your  health care provider. Make sure you discuss any questions you have with your health care provider.   Document Released: 04/24/2002 Document Revised: 01/14/2011 Document Reviewed: 01/09/2013 Elsevier Interactive Patient Education [employer].

## 2015-08-16 ENCOUNTER — Ambulatory Visit: Payer: Commercial Managed Care - HMO | Attending: Family Medicine

## 2015-08-16 ENCOUNTER — Encounter: Payer: Self-pay | Admitting: Family Medicine

## 2015-08-16 ENCOUNTER — Encounter: Payer: Self-pay | Admitting: *Deleted

## 2015-08-16 ENCOUNTER — Other Ambulatory Visit: Payer: Self-pay | Admitting: *Deleted

## 2015-08-16 DIAGNOSIS — R2689 Other abnormalities of gait and mobility: Secondary | ICD-10-CM | POA: Insufficient documentation

## 2015-08-16 DIAGNOSIS — G3184 Mild cognitive impairment, so stated: Secondary | ICD-10-CM | POA: Insufficient documentation

## 2015-08-16 DIAGNOSIS — R42 Dizziness and giddiness: Secondary | ICD-10-CM | POA: Diagnosis not present

## 2015-08-16 DIAGNOSIS — H8113 Benign paroxysmal vertigo, bilateral: Secondary | ICD-10-CM | POA: Diagnosis not present

## 2015-08-16 DIAGNOSIS — H8110 Benign paroxysmal vertigo, unspecified ear: Secondary | ICD-10-CM

## 2015-08-16 HISTORY — DX: Benign paroxysmal vertigo, unspecified ear: H81.10

## 2015-08-16 LAB — VITAMIN D 25 HYDROXY (VIT D DEFICIENCY, FRACTURES): Vit D, 25-Hydroxy: 13 ng/mL — ABNORMAL LOW (ref 30–100)

## 2015-08-16 NOTE — Progress Notes (Signed)
   Subjective:    Patient ID: CHASTIN GARLITZ, female    DOB: Jul 02, 1950, 65 y.o.   MRN: [id number]  HPI  Falls -FALL  Location: fell walking down steps at [employer], one fall with vertigo, Activity prior to fall: walking Change in position prior to fall: no Dizziness prior to fall:  yes Syncope prior to fall:  no Vertigo:  yes Chest Pain/Palpitations:  no Loss of balance:   yes Slip: yes Trip: no Slide:  no What Body parts struck object(s) or ground: none  Vision Difficulty:  no Independent in ADLs: yes  Independent in I-ADLs: yes      Review of Systems See hpi    Objective:   Physical Exam  VS reviewed GEN: Alert, Cooperative, Groomed, NAD  COR: RRR, No M/G/R, No JVD, Normal PMI size and location LUNGS: BCTA, No Acc mm use, speaking in full sentences Neuro: Oriented to person, place, and time; Strength: 5/5 Bil. UE and LE symmetric;  Sensation: Intact monofilament 4 out of 5 on right foot and 3 out of 5 on left foot. Intact grt toe proprioception bilat.  Hallpike Maneuver: positive lateral nystagmus with right ear down.  Went directly into Epley maneuver by Dr Berkley Harvey.  Patient tolerated maneuver well.   Gait: Normal speed, No significant path deviation, Step through +,  Psych: Normal affect/thought/speech/language       Assessment & Plan:

## 2015-08-16 NOTE — Assessment & Plan Note (Addendum)
New diagnosis.  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 Epley maneuver applied by Dr Berkley Harvey. Pt tolerated it well.  Will send note to PT to ask Vestibular Rehab to also be involved in her therapy.  Discussed recurrent nature of BPPV.  Discussed use of extinguishing exercises.  Will message PT to teach self-care exercises and maneuvers for BPPV attacks.  No anticholinergic for the time being, if possible.

## 2015-08-16 NOTE — Assessment & Plan Note (Signed)
Established problem worsened.  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  Pt will start PT tomorrow.  Recommended starting Vitamin D (OTC) 1000 IU daily to decrease fall risk.

## 2015-08-16 NOTE — Therapy (Signed)
[facility] [address] [place], [place], [address] Phone: [phone]   Fax:  [phone]  Physical Therapy Evaluation  Patient Details  Name: Lauren [name] MRN: [id number] Date of Birth: 06/01/1950 Referring Provider: Dr. Wendy [name]  Encounter Date: 08/16/2015      PT End of Session - 08/16/15 1611    Visit Number 1   Number of Visits 9   Date for PT Re-Evaluation 09/15/15   Authorization Type G-code and progress note every 10th visit. No auth require per Lauren [name]   PT Start Time 1146   PT Stop Time 1235   PT Time Calculation (min) 49 min   Equipment Utilized During Treatment Gait belt   Activity Tolerance Other (comment)  limited due to nausea/vomiting   Behavior During Therapy [facility] for tasks assessed/performed      Past Medical History  Diagnosis Date  . Depression   . Hyperlipidemia   . Hypertension   . Lung nodule     FNA ordered for 04/02/10 by HA>pos Ca  . Chronic folliculitis     of groin  . Fibrocystic breast changes   . Family history of trichomonal vaginitis 05/2005  . Postmenopausal   . Depressive disorder   . Hypercholesterolemia   . GERD (gastroesophageal reflux disease)   . Cerebral aneurysm   . Back pain   . Shortness of breath   . COPD (chronic obstructive pulmonary disease) ([place])   . Coronary artery disease   . Arthritis   . Weight loss 10/22/2011  . Status post radiation therapy 11/16/11 - 12/29/11    Right Lung and Mediastinum: 60 Gy  . Status post chemotherapy comp. 12/29/11    Carboplatin/Taxol  . S/P radiation therapy 03/01/12    SRS: 1 fraction / 20 Gray each to the Left Occipital Region and to the Right Insular Metastases  . On antineoplastic chemotherapy started 02/2012    [place]  . Fatigue 02/06/2013  . Pleural effusion 05/03/2013  . Blurred vision, bilateral 06/06/2014  . Colon cancer ([place]) 11/08  . Lung cancer ([place]) 06/16/10    PET scan 04/28/2010; primary: increase in  size 02/2010 / Well Differentiated Adenocarcinoma of the lung   . Brain metastases ([place]) 02/15/12  . Other fatigue 11/28/2014  . Bilateral pleural effusion 08/09/2013  . POSTMENOPAUSAL SYNDROME 01/31/2009    Qualifier: Diagnosis of  By: Lauren Sax  MD, Lauren [name]    . Hypokalemia 08/09/2013  . 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 Bilateral leg edema 10/17/2014  . 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 Pleural effusion, malignant 05/2013    Recurrent Pleural Effusion  . Status post stereotactic radiosurgery 01/2012    for Brain Metastases  . Tobacco dependence   . Family history of Huntington's disease   . Acute on chronic respiratory failure with hypoxemia ([place]) 07/11/2015  . Falls frequently 08/02/2015    Past Surgical History  Procedure Laterality Date  . Colectomy  03/22/07    Stage 1 pT2 N0, M0 Adenocarcinoma of the sigmoid  colon  . Tubal ligation    . Lung lobectomy  06/16/10    Left Upper Lobectomy  . Hernia repair    . Breast surgery      Bil lumpectomy  . Back surgery      Dr Lauren [name]  . Mediastinoscopy  10/19/2011    Procedure: MEDIASTINOSCOPY;  Surgeon: Lauren Pollack, MD;  Location: [facility] OR;  Service: Thoracic;  Laterality: N/A;  . Cardiac cath x3    .  Chest tube insertion Right 06/12/2013    Procedure: INSERTION PLEURAL DRAINAGE CATHETER;  Surgeon: Lauren Pollack, MD;  Location: [facility];  Service: Thoracic;  Laterality: Right;  . [place] pleurodesis Right 06/12/2013    Procedure: Lauren [name];  Surgeon: Lauren Pollack, MD;  Location: [facility];  Service: Thoracic;  Laterality: Right;  . Tunneled venous catheter placement      Port-a-Cath    There were no vitals filed for this visit.  Visit Diagnosis:  Dizziness and giddiness - Plan: PT plan of care cert/re-cert  BPPV (benign paroxysmal positional vertigo), bilateral - Plan: PT plan of care cert/re-cert  Other abnormalities of gait and mobility - Plan: PT plan of care cert/re-cert      Subjective Assessment - 08/16/15 1159     Subjective Pt reported dizziness, falls and balance issues began in 05/2015, with no known cause. Pt has fallen 5-6 times in the last 6 months. Pt described the dizziness as a spinning sensation that lasts a few minutes and then feels woozy and nauseated after initial spinning sensation. Pt reported turning the the R side while supine makes it worse. Pt reports dizziness is 10/10 at worst and 0/10 at best.  Pt reports she veers to the L side during amb. and is fearful of falling.    Patient is accompained by: Family member  pt's sister: Lauren [name]   Pertinent History Active lung CA and pt currently smokes, hx of colon and lung CA with brain mets, LVH, cerebral aneurysm, COPD, neuropathy from chemo, depressive disorder, LBP with sciatica, arthritis, HTN   Patient Stated Goals To be normal again, and IND. "I want to be in control again". Stop dizziness.   Currently in Pain? No/denies            [facility] PT Assessment - 08/16/15 1205    Assessment   Medical Diagnosis Falls frequently   Referring Provider Dr. McDiarmid   Onset Date/Surgical Date 05/19/15   Prior Therapy none for balance   Precautions   Precautions Fall   Precaution Comments based on gait speed and falls hx   Restrictions   Weight Bearing Restrictions No   Balance Screen   Has the patient fallen in the past 6 months Yes   How many times? 5-6   Has the patient had a decrease in activity level because of a fear of falling?  Yes   Is the patient reluctant to leave their home because of a fear of falling?  Yes   [place] residence   Living Arrangements Alone   Available Help at Discharge Available PRN/intermittently   Type of [place] to enter   Entrance Stairs-Number of Steps 3-4   Entrance Stairs-Rails Left   Home Layout One level   [place] - single point;Grab bars - tub/shower   Prior Function   Level of Independence Independent   Vocation Retired    Leisure be around people, go to [employer]   Overall Cognitive Status History of cognitive impairments - at baseline  per pt, she has to write things down   Observation/Other Assessments   Focus on Therapeutic Outcomes (FOTO)  ABC: 1.9%, with scores of 0% representing no confidence in balance   Ambulation/Gait   Ambulation/Gait Yes   Ambulation/Gait Assistance 4: Min guard;4: Min assist   Ambulation/Gait Assistance Details 2 LOB episodes during gait, which required min A to maintain balance.   Ambulation  Distance (Feet) 75 Feet   Assistive device Straight cane   Gait Pattern Decreased stride length;Decreased dorsiflexion - left;Step-through pattern;Shuffle;Narrow base of support   Ambulation Surface Level;Indoor   Gait velocity 0.57f/sec.  with SPC   Balance   Balance Assessed Yes   Static Standing Balance   Static Standing - Balance Support No upper extremity supported   Static Standing - Level of Assistance 4: Min assist;Other (comment)  min guard   Static Standing Balance -  Activities  Single Leg Stance - Right Leg   Static Standing - Comment/# of Minutes Pt maintained R SLS balance for 2sec. prior to requiring min A to maintain balance.            Vestibular Assessment - 08/16/15 1312    Symptom Behavior   Type of Dizziness Spinning   Frequency of Dizziness daily   Duration of Dizziness Less than a few minutes   Aggravating Factors Rolling to right;Turning head quickly   Relieving Factors Rest;Closing eyes;Slow movements   Occulomotor Exam   Occulomotor Alignment Normal   Spontaneous Absent   Gaze-induced Absent   Smooth Pursuits Intact   Saccades Intact   Comment No dizziness during occulomotor exam   Vestibulo-Occular Reflex   VOR 1 Head Only (x 1 viewing) WNL, no dizziness reported.   Positional Testing   Dix-Hallpike Dix-Hallpike Right;Dix-Hallpike Left   Dix-Hallpike Right   Dix-Hallpike Right Duration <45 sec. with 10/10 dizziness. Pt had to sit up  in order to vomit 2/2 severity of nausea/dizziness.   Dix-Hallpike Right Symptoms Upbeat, right rotatory nystagmus  Difficult to assess nystagmus as pt closed eyes   Dix-Hallpike Left   Dix-Hallpike Left Duration <30 sec., unclear if true L pBPPV, as pt shifted head to midline position while transferring from sitting to supine, pt reported 2/10 dizziness.   Dix-Hallpike Left Symptoms Other (comment)  Nystagmus noted, but then pt closed eyes                       PT Education - 08/16/15 1610    Education provided Yes   Education Details PT discussed frequency/duration. PT educated pt on BPPV, and the importance of having a driver present at each session. PT discussed the importance of keeping eyes open during testing in order for PT to assess direction of nystagmus.    Person(s) Educated Patient;Other (comment)  pt's sister   Methods Explanation   Comprehension Verbalized understanding          PT Short Term Goals - 08/16/15 1634    PT SHORT TERM GOAL #1   Title same as LTGs.            PT Long Term Goals - 08/16/15 1634    PT LONG TERM GOAL #1   Title Pt will be IND in HEP to improve balance and decr. dizziness to improve functional mobility. Target date: 09/13/15   Status New   PT LONG TERM GOAL #2   Title Pt will improve gait speed with LRAD to >/=1.82fsec. to decrease falls risk.Target date: 09/13/15   Status New   PT LONG TERM GOAL #3   Title Perform BERG and write goal if appropriate. Target date: 09/13/15   Status New   PT LONG TERM GOAL #4   Title Pt will report </=2/10 dizziness when turning to R side while in supine position to improve safety during functional mobility. Target date: 09/13/15   Status New   PT LONG TERM GOAL #  5   Title Pt will amb. 500' with LRAD over even and paved surfaces at MOD I level to improve functional moiblity. Target date: 09/13/15   Status New   Additional Long Term Goals   Additional Long Term Goals Yes   PT LONG TERM  GOAL #6   Title Pt will improve ABC score from 1.9% to 16% to improve quality of life and confidence during gait. Target date: 09/13/15   Status New               Plan - Aug 23, 2015 1622    Clinical Impression Statement Pt is a 64y/o female presenting to OPPT neuro with frequent falls. Pt exam was limited 2/2 nausea/vomiting. Exam findings include: gait deviations, impaired balance, dizziness which caused nausea/vomiting, impaired strength based on gait deviations. Pt experienced severe dizziness and N/V during R Dix-Hallpike and nystagmus, though it was difficult to discern direction of nystagmus as pt closed eyes and had to sit up in order to vomit. Therefore, it is likely pt has pBPPV, potentially affecting B canals. PT will assess in detail next session and treat if tolerated. If pt unable to tolerate testing and treatment, PT will notify MD and ask for assistance to decr. nausea.    Pt will benefit from skilled therapeutic intervention in order to improve on the following deficits Abnormal gait;Dizziness;Postural dysfunction;Decreased cognition;Decreased balance;Decreased mobility;Decreased knowledge of use of DME;Decreased strength   Rehab Potential Fair   Clinical Impairments Affecting Rehab Potential co-morbidities   PT Frequency 2x / week   PT Duration 4 weeks   PT Treatment/Interventions ADLs/Self Care Home Management;Biofeedback;Canalith Repostioning;Balance training;Manual techniques;Therapeutic exercise;Vestibular;Therapeutic activities;Functional mobility training;Stair training;Gait training;DME Instruction;Patient/family education;Orthotic Fit/Training;Cognitive remediation;Neuromuscular re-education   PT Next Visit Plan Perform B Dix-Hallpike and horizontal canal testing and treat as tolerated. Perform BERG if tolerated and provide balance HEP. Assess gait with rollator.   Consulted and Agree with Plan of Care Patient;Family member/caregiver   Family Member Consulted pt's sister:  Budd Palmer - 2015/08/23 1638    Functional Assessment Tool Used Gait speed: 0.38f/sec. with SPC; ABC: 1.9%, R SLS: 2sec. prior to requiring min A to maintain balance.   Functional Limitation Mobility: Walking and moving around   Mobility: Walking and Moving Around Current Status [phone] At least 60 percent but less than 80 percent impaired, limited or restricted   Mobility: Walking and Moving Around Goal Status [phone] At least 20 percent but less than 40 percent impaired, limited or restricted       Problem List Patient Active Problem List   Diagnosis Date Noted  . Falls frequently 08/02/2015  . COPD mixed type ([place] 07/15/2015  . Dyspnea 07/15/2015  . Acute on chronic respiratory failure with hypoxemia ([place] 07/11/2015  . Postural dizziness 06/19/2015  . Protein calorie malnutrition ([place] 03/14/2015  . Family history of Huntington's disease   . LVH (left ventricular hypertrophy) 12/20/2014  . Other fatigue 11/28/2014  . Blepharitis of both eyes 11/09/2014  . Bilateral leg edema 10/17/2014  . Anemia due to chemotherapy 08/29/2014  . Neuropathy due to chemotherapeutic drug ([place] 07/19/2014  . Left-sided low back pain with left-sided sciatica 07/18/2014  . Low back pain with sciatica 06/22/2014  . Colon cancer ([place] 05/31/2013  . Fatigue 02/06/2013  . ASCUS with positive high risk HPV 04/21/2012  . Brain metastases treated with radiosurgery 02/26/2012  . Health care maintenance 01/20/2012  . Insomnia 10/05/2011  . Cancer of upper lobe of left  lung, Adenocarcinoma   . TOBACCO ABUSE 10/04/2007  . Coronary atherosclerosis 09/09/2007  . HYPERCHOLESTEROLEMIA 12/29/2006  . DEPRESSIVE DISORDER, MAJOR, RCR, MILD 12/29/2006  . Essential hypertension 12/29/2006  . CEREBRAL ANEURYSM 12/29/2006  . GERD 12/29/2006    Zinnia Tindall L 08/16/2015, 4:42 PM  [facility] [address] [place], [place],  [address] Phone: [phone]   Fax:  [phone]  Name: KRYSIA ZAHRADNIK MRN: [id number] Date of Birth: [birth year]   Geoffry Paradise, PT,DPT 08/16/2015 4:42 PM Phone: [phone] Fax: [phone]

## 2015-08-16 NOTE — Assessment & Plan Note (Signed)
New problem Will reconfirm cognitive impairment on testing MoCA If confirmed will monitor for evidence of progression expected with chronic, progressive neurodegenerative process Patient able to perform iADLs and ADLs currently.

## 2015-08-19 ENCOUNTER — Ambulatory Visit: Payer: Self-pay | Admitting: *Deleted

## 2015-08-19 ENCOUNTER — Other Ambulatory Visit: Payer: Self-pay | Admitting: Licensed Clinical Social Worker

## 2015-08-19 NOTE — Patient Outreach (Signed)
[facility]) Care Management  08/19/2015  Lauren [name] 07/31/1950 [id number]   Assessment-This CSW is covering for [employer] CSW completed outreach to patient. Patient answered. HIPPA verifications provided. Patent reports that she did receive transportation resources in the mail. CSW questioned if she would like to complete review of them today but patient states that today is not a good day. Patient is agreeable to CSW completing outreach this week to review transportation resources.  Plan-CSW will complete outreach by 08/23/15.  [name], BSW, MSW, [email or web address] Phone: [phone] Fax: [phone]

## 2015-08-20 ENCOUNTER — Ambulatory Visit
Payer: Commercial Managed Care - HMO | Attending: Family Medicine | Admitting: Rehabilitative and Restorative Service Providers"

## 2015-08-20 DIAGNOSIS — R2689 Other abnormalities of gait and mobility: Secondary | ICD-10-CM | POA: Diagnosis not present

## 2015-08-20 DIAGNOSIS — R42 Dizziness and giddiness: Secondary | ICD-10-CM | POA: Diagnosis not present

## 2015-08-20 DIAGNOSIS — H8113 Benign paroxysmal vertigo, bilateral: Secondary | ICD-10-CM | POA: Diagnosis not present

## 2015-08-20 NOTE — Patient Instructions (Signed)
Tip Card 1.The goal of habituation training is to assist in decreasing symptoms of vertigo, dizziness, or nausea provoked by specific head and body motions. 2.These exercises may initially increase symptoms; however, be persistent and work through symptoms. With repetition and time, the exercises will assist in reducing or eliminating symptoms. 3.Exercises should be stopped and discussed with the therapist if you experience any of the following: - Sudden change or fluctuation in hearing - New onset of ringing in the ears, or increase in current intensity - Any fluid discharge from the ear - Severe pain in neck or back - Extreme nausea  Copyright  VHI. All rights reserved.  Rolling    With3-4 pillows under head, start on back.  Turn your head to the right and wait until dizziness stops, count to 20 seconds.  Then return to middle.  Rest and let symptoms settle.  Turn your head ro the left and wait until dizziness stops, count to 20 seconds.  Copyright  VHI. All rights reserved.   Sit to Side-Lying   Sit on edge of bed. Lie down onto the right side and hold until dizziness stops, plus 20 seconds.  Return to sitting and wait until dizziness stops, plus 20 seconds.  Repeat to the left side. Repeat sequence 5 times per session. Do 2 sessions per day.  Copyright  VHI. All rights reserved.  Gaze Stabilization: Tip Card 1.Target must remain in focus, not blurry, and appear stationary while head is in motion. 2.Perform exercises with small head movements (45 to either side of midline). 3.Increase speed of head motion so long as target is in focus. 4.If you wear eyeglasses, be sure you can see target through lens (therapist will give specific instructions for bifocal / progressive lenses). 5.These exercises may provoke dizziness or nausea. Work through these symptoms. If too dizzy, slow head movement slightly. Rest between each exercise. 6.Exercises demand concentration; avoid  distractions. 7.For safety, perform standing exercises close to a counter, wall, corner, or next to someone.  Copyright  VHI. All rights reserved.  Gaze Stabilization: Standing Feet Apart   Feet shoulder width apart, keeping eyes on target on wall 3 feet away, tilt head down slightly and move head side to side for 30 seconds. Repeat while moving head up and down for 30 seconds. Do 2 sessions per day.   Copyright  VHI. All rights reserved.

## 2015-08-20 NOTE — Therapy (Signed)
[facility] [address] [place], [place], [address] Phone: [phone]   Fax:  [phone]  Physical Therapy Treatment  Patient Details  Name: Lauren [name] MRN: [id number] Date of Birth: [birth year] Referring Provider: Dr. Wendy Poet  Encounter Date: 08/20/2015      PT End of Session - 08/20/15 1559    Visit Number 2   Number of Visits 9   Date for PT Re-Evaluation 09/15/15   Authorization Type G-code and progress note every 10th visit. No auth require per Lattie Haw   PT Start Time 1240   PT Stop Time 1320   PT Time Calculation (min) 40 min   Activity Tolerance Other (comment)  limited due to apprehension/fear of movement provoking vertigo + n/v   Behavior During Therapy [facility] for tasks assessed/performed      Past Medical History  Diagnosis Date  . Depression   . Hyperlipidemia   . Hypertension   . Lung nodule     FNA ordered for 04/02/10 by HA>pos Ca  . Chronic folliculitis     of groin  . Fibrocystic breast changes   . Family history of trichomonal vaginitis 05/2005  . Postmenopausal   . Depressive disorder   . Hypercholesterolemia   . GERD (gastroesophageal reflux disease)   . Cerebral aneurysm   . Back pain   . Shortness of breath   . COPD (chronic obstructive pulmonary disease) ([place])   . Coronary artery disease   . Arthritis   . Weight loss 10/22/2011  . Status post radiation therapy 11/16/11 - 12/29/11    Right Lung and Mediastinum: 60 Gy  . Status post chemotherapy comp. 12/29/11    Carboplatin/Taxol  . S/P radiation therapy 03/01/12    SRS: 1 fraction / 20 Gray each to the Left Occipital Region and to the Right Insular Metastases  . On antineoplastic chemotherapy started 02/2012    [place]  . Fatigue 02/06/2013  . Pleural effusion 05/03/2013  . Blurred vision, bilateral 06/06/2014  . Colon cancer ([place]) 11/08  . Lung cancer ([place]) 06/16/10    PET scan 04/28/2010; primary: increase in size 02/2010 /  Well Differentiated Adenocarcinoma of the lung   . Brain metastases ([place]) 02/15/12  . Other fatigue 11/28/2014  . Bilateral pleural effusion 08/09/2013  . POSTMENOPAUSAL SYNDROME 01/31/2009    Qualifier: Diagnosis of  By: Carlena Sax  MD, Colletta [place]    . Hypokalemia 08/09/2013  . 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 Bilateral leg edema 10/17/2014  . 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 Pleural effusion, malignant 05/2013    Recurrent Pleural Effusion  . Status post stereotactic radiosurgery 01/2012    for Brain Metastases  . Tobacco dependence   . Family history of Huntington's disease   . Acute on chronic respiratory failure with hypoxemia ([place]) 07/11/2015  . Falls frequently 08/02/2015  . Benign paroxysmal positional vertigo 08/16/2015    Past Surgical History  Procedure Laterality Date  . Colectomy  03/22/07    Stage 1 pT2 N0, M0 Adenocarcinoma of the sigmoid  colon  . Tubal ligation    . Lung lobectomy  06/16/10    Left Upper Lobectomy  . Hernia repair    . Breast surgery      Bil lumpectomy  . Back surgery      Dr Luiz Ochoa  . Mediastinoscopy  10/19/2011    Procedure: MEDIASTINOSCOPY;  Surgeon: Gaye Pollack, MD;  Location: [facility] OR;  Service: Thoracic;  Laterality: N/A;  . Cardiac  cath x3    . Chest tube insertion Right 06/12/2013    Procedure: INSERTION PLEURAL DRAINAGE CATHETER;  Surgeon: Gaye Pollack, MD;  Location: [place];  Service: Thoracic;  Laterality: Right;  . Talc pleurodesis Right 06/12/2013    Procedure: Pietro Cassis;  Surgeon: Gaye Pollack, MD;  Location: [place];  Service: Thoracic;  Laterality: Right;  . Tunneled venous catheter placement      Port-a-Cath    There were no vitals filed for this visit.  Visit Diagnosis:  BPPV (benign paroxysmal positional vertigo), bilateral  Other abnormalities of gait and mobility      Subjective Assessment - 08/20/15 1243    Subjective The patient reports that she was sick for 2 days after PT evaluation.  "I don't want to go  through that again."  Today she reports she is feeling better.     Patient Stated Goals To be normal again, and IND. "I want to be in control again". Stop dizziness.   Currently in Pain? No/denies                Vestibular Assessment - 08/20/15 1244    Vestibular Assessment   General Observation PT and patient discussed treatment options and she does not feel that she can tolerate dix hallpike testing toay.  Instead, the patient was brought through slow habituation exercises for the purpose of reducing response to stimulus of dizziness as well as decreasing patient guarding with movement.  This could treat if patient will eventually allow greater intensity through increased speed and also reducing pillows.    Positional Testing   Sidelying Test Sidelying Right;Sidelying Left   Horizontal Canal Testing Horizontal Canal Right;Horizontal Canal Left   Sidelying Right   Sidelying Right Duration with 4 pillows and reducing to 2 pillows, no nystagmus viewed in room light.  Goal of treatment was to improve tolerance to motion at a self regulated pace.   Sidelying Left   Sidelying Left Duration Began with 4 pillows and reduced to 2 pillows.     Horizontal Canal Right   Horizontal Canal Right Duration Was educating patient in habituation for HEP and tried rolling R<>L with stop in middle at slow pace with 2 pillows.  Patient on 3rd rep to the right side developed a spinning sensation that lasted for seconds (approx 45-60) with geotropic nystagmus initially noted and then transitioned to apogeotropic.     Horizontal Canal Right Symptoms Nystagmus   Horizontal Canal Left   Horizontal Canal Left Duration reports mild dizziness, but not spinning sensation.                 [facility] Adult PT Treatment/Exercise - 08/20/15 1603    Ambulation/Gait   Ambulation/Gait Yes   Ambulation/Gait Assistance 4: Min guard   Ambulation/Gait Assistance Details patient held wall leaving clinic and used [facility]  with wider base of support noted   Ambulation Distance (Feet) 70 Feet   Assistive device Straight cane   Self-Care   Self-Care Other Self-Care Comments   Other Self-Care Comments  PT and patient discussed performing habituation that was modified in the home.  Recommended waiting minutes after exercises before walking.  Discussed PT to progress habituation to her tolerance as she is able to progress through these exercises.  PT also to further assess balance due to falls.          Vestibular Treatment/Exercise - 08/20/15 1602    Vestibular Treatment/Exercise   Vestibular Treatment Provided Habituation   Habituation  Exercises Laruth Bouchard Daroff;Horizontal Roll   Laruth Bouchard Daroff   Number of Reps  3   Symptom Description  *see assessment section   Horizontal Roll   Number of Reps  3   Symptom Description  *see assessment section               PT Education - 08/20/15 1558    Education provided Yes   Education Details HEP: modified habituation for supine wiht 2 pillows and horizontal head turns, brandt daroff with 3 pillows without head turn to look to ceiling.   Person(s) Educated Patient   Methods Explanation;Demonstration;Handout   Comprehension Verbalized understanding;Returned demonstration          PT Short Term Goals - 08/16/15 1634    PT SHORT TERM GOAL #1   Title same as LTGs.            PT Long Term Goals - 08/16/15 1634    PT LONG TERM GOAL #1   Title Pt will be IND in HEP to improve balance and decr. dizziness to improve functional mobility. Target date: 09/13/15   Status New   PT LONG TERM GOAL #2   Title Pt will improve gait speed with LRAD to >/=1.72f/sec. to decrease falls risk.Target date: 09/13/15   Status New   PT LONG TERM GOAL #3   Title Perform BERG and write goal if appropriate. Target date: 09/13/15   Status New   PT LONG TERM GOAL #4   Title Pt will report </=2/10 dizziness when turning to R side while in supine position to improve safety during  functional mobility. Target date: 09/13/15   Status New   PT LONG TERM GOAL #5   Title Pt will amb. 500' with LRAD over even and paved surfaces at MOD I level to improve functional moiblity. Target date: 09/13/15   Status New   Additional Long Term Goals   Additional Long Term Goals Yes   PT LONG TERM GOAL #6   Title Pt will improve ABC score from 1.9% to 16% to improve quality of life and confidence during gait. Target date: 09/13/15   Status New               Plan - 08/20/15 1605    Clinical Impression Statement The patient did not wish to reassess BPPV with positional testing today.  PT instead tried to modify to patient tolerance.  One severe episode of spinning provoked when moving supint>R sidelying.  Nystagmus initially appeared geotropic in nature indicating R horizontal canalithiasis, however changed direction to be apogeotropic after 45-60 seconds.  As full positional testing not completed, difficult to determine canal involvement.     Pt will benefit from skilled therapeutic intervention in order to improve on the following deficits Abnormal gait;Dizziness;Postural dysfunction;Decreased cognition;Decreased balance;Decreased mobility;Decreased knowledge of use of DME;Decreased strength   Rehab Potential Fair   Clinical Impairments Affecting Rehab Potential co-morbidities   PT Frequency 2x / week   PT Duration 4 weeks   PT Treatment/Interventions ADLs/Self Care Home Management;Biofeedback;Canalith Repostioning;Balance training;Manual techniques;Therapeutic exercise;Vestibular;Therapeutic activities;Functional mobility training;Stair training;Gait training;DME Instruction;Patient/family education;Orthotic Fit/Training;Cognitive remediation;Neuromuscular re-education   PT Next Visit Plan Assess balance with BMerrilee Jansky check habituation HEP, trial Rollater RW for safer mobility, provide balance HEP.  *treat BPPV to tolerance.   Consulted and Agree with Plan of Care Patient;Family  member/caregiver   Family Member Consulted sister        Problem List Patient Active Problem List   Diagnosis Date Noted  . Mild  cognitive impairment 08/16/2015  . Benign paroxysmal positional vertigo 08/16/2015  . Falls frequently 08/02/2015  . COPD mixed type ([place]) 07/15/2015  . Dyspnea 07/15/2015  . Acute on chronic respiratory failure with hypoxemia ([place]) 07/11/2015  . Postural dizziness 06/19/2015  . Protein calorie malnutrition ([place]) 03/14/2015  . Family history of Huntington's disease   . LVH (left ventricular hypertrophy) 12/20/2014  . Other fatigue 11/28/2014  . Blepharitis of both eyes 11/09/2014  . Bilateral leg edema 10/17/2014  . Anemia due to chemotherapy 08/29/2014  . Neuropathy due to chemotherapeutic drug ([place]) 07/19/2014  . Left-sided low back pain with left-sided sciatica 07/18/2014  . Low back pain with sciatica 06/22/2014  . Colon cancer ([place]) 05/31/2013  . Fatigue 02/06/2013  . ASCUS with positive high risk HPV 04/21/2012  . Brain metastases treated with radiosurgery 02/26/2012  . Health care maintenance 01/20/2012  . Insomnia 10/05/2011  . Cancer of upper lobe of left lung, Adenocarcinoma   . TOBACCO ABUSE 10/04/2007  . Coronary atherosclerosis 09/09/2007  . HYPERCHOLESTEROLEMIA 12/29/2006  . DEPRESSIVE DISORDER, MAJOR, RCR, MILD 12/29/2006  . Essential hypertension 12/29/2006  . CEREBRAL ANEURYSM 12/29/2006  . GERD 12/29/2006    Taliana Mersereau, PT 08/20/2015, 4:08 PM  [facility] [address] [place], [place], [address] Phone: [phone]   Fax:  [phone]  Name: Lauren [name] MRN: [id number] Date of Birth: 09-15-1950

## 2015-08-21 ENCOUNTER — Other Ambulatory Visit: Payer: Self-pay | Admitting: Licensed Clinical Social Worker

## 2015-08-21 ENCOUNTER — Encounter: Payer: Self-pay | Admitting: Licensed Clinical Social Worker

## 2015-08-21 NOTE — Patient Outreach (Signed)
[facility]) Care Management  08/21/2015  Lauren [name] 12-17-1950 [id number]   Assessment-CSW completed outreach to patient. Patient answered and reported that this is a good time to talk. CSW introduced self, reason for call and of [facility]. CSW will take this patient instead of [place]. CSW will add self to care team. CSW expressed understanding. Patient is agreeable to home visit on 08/27/15 in order to complete SCAT and assessment.  Plan-CSW will complete home visit on 08/27/15.  [name], BSW, MSW, [email or web address] Phone: [phone] Fax: [phone]

## 2015-08-22 ENCOUNTER — Other Ambulatory Visit: Payer: Self-pay | Admitting: *Deleted

## 2015-08-22 ENCOUNTER — Ambulatory Visit: Payer: Self-pay | Admitting: *Deleted

## 2015-08-22 NOTE — Patient Outreach (Signed)
Call placed to member to confirm home visit scheduled for this morning.  Member asks how long will the visit take and state that she will need to reschedule due to another appointment she has today.  Initial home visit rescheduled for next week.  Valente David, BSN, [place] Management  [facility] Care Manager [phone]

## 2015-08-23 ENCOUNTER — Other Ambulatory Visit: Payer: Self-pay | Admitting: Hematology and Oncology

## 2015-08-26 ENCOUNTER — Ambulatory Visit: Payer: Commercial Managed Care - HMO

## 2015-08-26 DIAGNOSIS — R42 Dizziness and giddiness: Secondary | ICD-10-CM | POA: Diagnosis not present

## 2015-08-26 DIAGNOSIS — R2689 Other abnormalities of gait and mobility: Secondary | ICD-10-CM

## 2015-08-26 DIAGNOSIS — H8113 Benign paroxysmal vertigo, bilateral: Secondary | ICD-10-CM

## 2015-08-26 NOTE — Therapy (Signed)
[facility] [address] [place], [place], [address] Phone: [phone]   Fax:  [phone]  Physical Therapy Treatment  Patient Details  Name: Lauren [name] MRN: [id number] Date of Birth: 1951-01-01 Referring Provider: Dr. Wendy Poet  Encounter Date: 08/26/2015      PT End of Session - 08/26/15 0938    Visit Number 3   Number of Visits 9   Date for PT Re-Evaluation 09/15/15   Authorization Type G-code and progress note every 10th visit. No auth require per Lattie Haw   PT Start Time [phone]   PT Stop Time 0932   PT Time Calculation (min) 42 min   Equipment Utilized During Treatment Gait belt   Activity Tolerance Patient tolerated treatment well;Other (comment)  unable to test for BPPV 2/2 pt fear of dizziness and N/V   Behavior During Therapy [facility] for tasks assessed/performed      Past Medical History  Diagnosis Date  . Depression   . Hyperlipidemia   . Hypertension   . Lung nodule     FNA ordered for 04/02/10 by HA>pos Ca  . Chronic folliculitis     of groin  . Fibrocystic breast changes   . Family history of trichomonal vaginitis 05/2005  . Postmenopausal   . Depressive disorder   . Hypercholesterolemia   . GERD (gastroesophageal reflux disease)   . Cerebral aneurysm   . Back pain   . Shortness of breath   . COPD (chronic obstructive pulmonary disease) ([place])   . Coronary artery disease   . Arthritis   . Weight loss 10/22/2011  . Status post radiation therapy 11/16/11 - 12/29/11    Right Lung and Mediastinum: 60 Gy  . Status post chemotherapy comp. 12/29/11    Carboplatin/Taxol  . S/P radiation therapy 03/01/12    SRS: 1 fraction / 20 Gray each to the Left Occipital Region and to the Right Insular Metastases  . On antineoplastic chemotherapy started 02/2012    [place]  . Fatigue 02/06/2013  . Pleural effusion 05/03/2013  . Blurred vision, bilateral 06/06/2014  . Colon cancer ([place]) 11/08  . Lung cancer  ([place]) 06/16/10    PET scan 04/28/2010; primary: increase in size 02/2010 / Well Differentiated Adenocarcinoma of the lung   . Brain metastases ([place]) 02/15/12  . Other fatigue 11/28/2014  . Bilateral pleural effusion 08/09/2013  . POSTMENOPAUSAL SYNDROME 01/31/2009    Qualifier: Diagnosis of  By: Carlena Sax  MD, Colletta [place]    . Hypokalemia 08/09/2013  . 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 Bilateral leg edema 10/17/2014  . 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 Pleural effusion, malignant 05/2013    Recurrent Pleural Effusion  . Status post stereotactic radiosurgery 01/2012    for Brain Metastases  . Tobacco dependence   . Family history of Huntington's disease   . Acute on chronic respiratory failure with hypoxemia ([place]) 07/11/2015  . Falls frequently 08/02/2015  . Benign paroxysmal positional vertigo 08/16/2015    Past Surgical History  Procedure Laterality Date  . Colectomy  03/22/07    Stage 1 pT2 N0, M0 Adenocarcinoma of the sigmoid  colon  . Tubal ligation    . Lung lobectomy  06/16/10    Left Upper Lobectomy  . Hernia repair    . Breast surgery      Bil lumpectomy  . Back surgery      Dr Luiz Ochoa  . Mediastinoscopy  10/19/2011    Procedure: MEDIASTINOSCOPY;  Surgeon: Gaye Pollack, MD;  Location: [facility] OR;  Service: Thoracic;  Laterality: N/A;  . Cardiac cath x3    . Chest tube insertion Right 06/12/2013    Procedure: INSERTION PLEURAL DRAINAGE CATHETER;  Surgeon: Gaye Pollack, MD;  Location: [place];  Service: Thoracic;  Laterality: Right;  . Talc pleurodesis Right 06/12/2013    Procedure: Pietro Cassis;  Surgeon: Gaye Pollack, MD;  Location: [place];  Service: Thoracic;  Laterality: Right;  . Tunneled venous catheter placement      Port-a-Cath    There were no vitals filed for this visit.      Subjective Assessment - 08/26/15 0853    Subjective Pt denied falls since last visit. Pt reported she woke up on her R side and then rolled to L side in bed and did not feel dizzy. Pt  reported 0/10 dizziness at rest.    Patient is accompained by: Family member  pt's sister-[name]   Pertinent History Active lung CA and pt currently smokes, hx of colon and lung CA with brain mets, LVH, cerebral aneurysm, COPD, neuropathy from chemo, depressive disorder, LBP with sciatica, arthritis, HTN   Patient Stated Goals To be normal again, and IND. "I want to be in control again". Stop dizziness.   Currently in Pain? No/denies                         [facility] Adult PT Treatment/Exercise - 08/26/15 0854    Ambulation/Gait   Ambulation/Gait Yes   Ambulation/Gait Assistance 5: Supervision   Ambulation/Gait Assistance Details Cues for sequencing and to improve heel strike (R>L). After amb. indoors: HR: 95bpm, SaO2 on room air: 99%. pt required 2 seated rest breaks during amb. 2/2 fatigue.   Ambulation Distance (Feet) 150 Feet  indoors and 300' indoors/outdoors   Assistive device Rollator   Gait Pattern Decreased stride length;Decreased dorsiflexion - left;Step-through pattern;Shuffle;Narrow base of support   Ambulation Surface Level;Unlevel;Indoor;Outdoor;Paved   Standardized Balance Assessment   Standardized Balance Assessment Berg Balance Test   Berg Balance Test   Sit to Stand Able to stand  independently using hands   Standing Unsupported Able to stand safely 2 minutes   Sitting with Back Unsupported but Feet Supported on Floor or Stool Able to sit safely and securely 2 minutes   Stand to Sit Sits safely with minimal use of hands   Transfers Able to transfer safely, definite need of hands   Standing Unsupported with Eyes Closed Able to stand 10 seconds with supervision   Standing Ubsupported with Feet Together Able to place feet together independently and stand for 1 minute with supervision   From Standing, Reach Forward with Outstretched Arm Can reach forward >12 cm safely (5")  9"   From Standing Position, Pick up Object from Floor Able to pick up shoe, needs  supervision   From Standing Position, Turn to Look Behind Over each Shoulder Looks behind from both sides and weight shifts well   Turn 360 Degrees Needs close supervision or verbal cueing   Standing Unsupported, Alternately Place Feet on Step/Stool Able to complete 4 steps without aid or supervision   Standing Unsupported, One Foot in Front Able to plae foot ahead of the other independently and hold 30 seconds   Standing on One Leg Tries to lift leg/unable to hold 3 seconds but remains standing independently  2sec.   Total Score 41                PT  Education - 08/26/15 0936    Education provided Yes   Education Details PT educated pt on how to use rollator. PT educated pt on how to obtain rollator (PT will obtain Rx and asked pt to check with local churches for donated rollators) and asking apartment complex to add ramp to entrance to accommodate rollator for safety.    Person(s) Educated Patient   Methods Explanation   Comprehension Verbalized understanding          PT Short Term Goals - 08/16/15 1634    PT SHORT TERM GOAL #1   Title same as LTGs.            PT Long Term Goals - 08/26/15 0940    PT LONG TERM GOAL #1   Title Pt will be IND in HEP to improve balance and decr. dizziness to improve functional mobility. Target date: 09/13/15   Status On-going   PT LONG TERM GOAL #2   Title Pt will improve gait speed with LRAD to >/=1.64f/sec. to decrease falls risk.Target date: 09/13/15   Status On-going   PT LONG TERM GOAL #3   Title Perform BERG and write goal if appropriate. Pt will improve BERG score to >/=45/56 to decr. risk of falls. Target date: 09/13/15   Status Revised   PT LONG TERM GOAL #4   Title Pt will report </=2/10 dizziness when turning to R side while in supine position to improve safety during functional mobility. Target date: 09/13/15   Status On-going   PT LONG TERM GOAL #5   Title Pt will amb. 500' with LRAD over even and paved surfaces at MOD I  level to improve functional moiblity. Target date: 09/13/15   Status On-going   PT LONG TERM GOAL #6   Title Pt will improve ABC score from 1.9% to 16% to improve quality of life and confidence during gait. Target date: 09/13/15   Status On-going               Plan - 08/26/15 0939    Clinical Impression Statement Pt demonstrated improved balance when amb. with rollator, and was able to amb. longer distances. PT will request prescription for rollator from MD. Pt's BERG score of 41/56 indicates pt is at a significant risk for falls. PT will continue to try to assess for BPPV as tolerated by pt.    Rehab Potential Fair   Clinical Impairments Affecting Rehab Potential co-morbidities   PT Frequency 2x / week   PT Duration 4 weeks   PT Treatment/Interventions ADLs/Self Care Home Management;Biofeedback;Canalith Repostioning;Balance training;Manual techniques;Therapeutic exercise;Vestibular;Therapeutic activities;Functional mobility training;Stair training;Gait training;DME Instruction;Patient/family education;Orthotic Fit/Training;Cognitive remediation;Neuromuscular re-education   PT Next Visit Plan check habituation HEP, provide balance HEP.  *treat BPPV to tolerance.   PT Home Exercise Plan Habituation   Consulted and Agree with Plan of Care Patient;Family member/caregiver   Family Member Consulted sister      Patient will benefit from skilled therapeutic intervention in order to improve the following deficits and impairments:  Abnormal gait, Dizziness, Postural dysfunction, Decreased cognition, Decreased balance, Decreased mobility, Decreased knowledge of use of DME, Decreased strength  Visit Diagnosis: Other abnormalities of gait and mobility  Dizziness and giddiness  BPPV (benign paroxysmal positional vertigo), bilateral     Problem List Patient Active Problem List   Diagnosis Date Noted  . Mild cognitive impairment 08/16/2015  . Benign paroxysmal positional vertigo  08/16/2015  . Falls frequently 08/02/2015  . COPD mixed type ([place] 07/15/2015  . Dyspnea 07/15/2015  .  Acute on chronic respiratory failure with hypoxemia ([place]) 07/11/2015  . Postural dizziness 06/19/2015  . Protein calorie malnutrition ([place]) 03/14/2015  . Family history of Huntington's disease   . LVH (left ventricular hypertrophy) 12/20/2014  . Other fatigue 11/28/2014  . Blepharitis of both eyes 11/09/2014  . Bilateral leg edema 10/17/2014  . Anemia due to chemotherapy 08/29/2014  . Neuropathy due to chemotherapeutic drug ([place]) 07/19/2014  . Left-sided low back pain with left-sided sciatica 07/18/2014  . Low back pain with sciatica 06/22/2014  . Colon cancer ([place]) 05/31/2013  . Fatigue 02/06/2013  . ASCUS with positive high risk HPV 04/21/2012  . Brain metastases treated with radiosurgery 02/26/2012  . Health care maintenance 01/20/2012  . Insomnia 10/05/2011  . Cancer of upper lobe of left lung, Adenocarcinoma   . TOBACCO ABUSE 10/04/2007  . Coronary atherosclerosis 09/09/2007  . HYPERCHOLESTEROLEMIA 12/29/2006  . DEPRESSIVE DISORDER, MAJOR, RCR, MILD 12/29/2006  . Essential hypertension 12/29/2006  . CEREBRAL ANEURYSM 12/29/2006  . GERD 12/29/2006    Terrence Wishon L 08/26/2015, 9:42 AM  [facility] [address] [place], [place], [address] Phone: [phone]   Fax:  [phone]  Name: RONICA VIVIAN MRN: [id number] Date of Birth: 1950/10/20    Geoffry Paradise, PT,DPT 08/26/2015 9:42 AM Phone: [phone] Fax: [phone]

## 2015-08-27 ENCOUNTER — Encounter: Payer: Self-pay | Admitting: Licensed Clinical Social Worker

## 2015-08-27 ENCOUNTER — Other Ambulatory Visit: Payer: Self-pay | Admitting: Licensed Clinical Social Worker

## 2015-08-27 ENCOUNTER — Ambulatory Visit: Payer: Self-pay | Admitting: Licensed Clinical Social Worker

## 2015-08-27 NOTE — Patient Outreach (Signed)
[facility]) Care Management  [facility] Social Work  08/27/2015  Lauren [name] 19-Jun-1950 [id number]   Encounter Medications:  Outpatient Encounter Prescriptions as of 08/27/2015  Medication Sig Note  . albuterol (PROVENTIL HFA;VENTOLIN HFA) 108 (90 BASE) MCG/ACT inhaler Inhale 1 puff into the lungs 2 (two) times daily. (Patient taking differently: Inhale 2 puffs into the lungs every 6 (six) hours as needed. )   . aspirin 81 MG chewable tablet Chew 81 mg by mouth daily. 06/08/2013: Pt takes chewable but swallows it whole  . diclofenac sodium (VOLTAREN) 1 % GEL Apply 2 g topically 4 (four) times daily. As needed for pain   . FLUoxetine (PROZAC) 20 MG tablet Take 1 tablet by mouth daily. Reported on 07/31/2015 07/15/2015: Received from: External Pharmacy Received Sig:   . folic acid (FOLVITE) 1 MG tablet Take 1 tablet (1 mg total) by mouth daily.   . furosemide (LASIX) 20 MG tablet Take 1 tablet (20 mg total) by mouth daily.   Marland Kitchen lidocaine-prilocaine (EMLA) cream Apply topically as needed. Apply to porta cath site one hour prior to needle stick.   . nebivolol (BYSTOLIC) 10 MG tablet Take 1 tablet (10 mg total) by mouth daily.   . nitroGLYCERIN (NITROSTAT) 0.4 MG SL tablet Place 1 tablet (0.4 mg total) under the tongue every 5 (five) minutes as needed. For chest pain 12/24/2014: Old prescription  . omeprazole (PRILOSEC) 40 MG capsule Take 1 capsule (40 mg total) by mouth daily.   . potassium chloride SA (K-DUR,KLOR-CON) 20 MEQ tablet TAKE 1 TABLET TWICE DAILY (Patient taking differently: TAKE 1 TABLET once DAILY)   . pravastatin (PRAVACHOL) 40 MG tablet TAKE 1 TABLET BY MOUTH ONCE DAILY   . tiZANidine (ZANAFLEX) 2 MG tablet Take 1 tablet (2 mg total) by mouth every 6 (six) hours as needed (Back Muscle Spasm). 08/01/2015: Infrequent use  . traMADol (ULTRAM) 50 MG tablet Take 1 tablet (50 mg total) by mouth every 6 (six) hours as needed for moderate pain or severe pain.   Marland Kitchen  umeclidinium-vilanterol (ANORO ELLIPTA) 62.5-25 MCG/INH AEPB Inhale 1 puff into the lungs daily.    No facility-administered encounter medications on file as of 08/27/2015.    Functional Status:  In your present state of health, do you have any difficulty performing the following activities: 08/27/2015 07/03/2015  Hearing? N N  Vision? Y Y  Difficulty concentrating or making decisions? Y N  Walking or climbing stairs? Y Y  Dressing or bathing? N N  Doing errands, shopping? Y Y  Conservation officer, nature and eating ? - N  Using the Toilet? - N  In the past six months, have you accidently leaked urine? - N  Do you have problems with loss of bowel control? - N  Managing your Medications? - N  Managing your Finances? - N  Housekeeping or managing your Housekeeping? - N    Fall/Depression Screening:  PHQ 2/9 Scores 08/27/2015 08/01/2015 07/31/2015 07/03/2015 06/04/2015 06/04/2015 02/08/2015  PHQ - 2 Score 2 0 0 1 0 0 1  PHQ- 9 Score 4 - - - - - -  Exception Documentation - - Patient refusal - - - -    Assessment: CSW completed home visit on 08/27/15. Patient was very pleasant throughout entire session. Patient is interested in gaining stable transportation as her car has many problems and she is unable to use it as much. Patient's car has engine light on and issues with radiator and transmission. CSW provided patient with instructions  on how to schedule transportation with [facility] as she can have up to 12 one way trips free of charge with her insurance. Patient reports that she currently has Medicaid MQB to help with her premiums. Patient receives $1,309 per month from social security.  Patient receives $16.00 per month in food stamps. Patient shares that her support network consist of a few friends and her sister. Patient states that she uses a cane and eyeglasses but currently is in need of a walker. CSW informed her that [employer] at times has available walkers to own. Patient reports that her  physical therapist has placed an order to her physician in order to gain a walker. Patient shares that she has currently has lung cancer and has had previously brain and colon cancer. CSW provided reflective listening. Patient shares "I am a very strong person. I get it from my mother. I have dealt with a lot." Patient reports that at times her health can get her down but that she has a Social worker at both [place] and at [facility]. She denies needing mental health resources. CSW completed [facility] application with patient. CSW will fax it to [facility] on 08/28/15. CSW will follow up in two weeks.  Plan: CSW will fax [employer] application and complete outreach within two weeks.   THN CM Care Plan Problem One        Most Recent Value   Care Plan Problem One  Lack of stable transportation   Role Documenting the Problem One  Clinical Social Worker   Care Plan for Problem One  Active   THN Long Term Goal (31-90 days)  Patient will gain stable transportation with [employer] within 90 day days   THN Long Term Goal Start Date  08/27/15   Interventions for Problem One Long Term Goal  CSW has completed home visit and completed [employer] application and will fax it on 08/27/15. CSW will monitor this process   THN CM Short Term Goal #1 (0-30 days)  Patient will be educated on [place] transportation within 30 days   THN CM Short Term Goal #1 Start Date  08/27/15   Interventions for Short Term Goal #1  CSW has printed out directions on [employer] transportation and how to arrange transportation with them. CSW will complete review of this resource which is available to her free of charge.     [name], BSW, MSW, [email or web address] Phone: [phone] Fax: [phone]

## 2015-08-28 ENCOUNTER — Encounter: Payer: Self-pay | Admitting: Pulmonary Disease

## 2015-08-28 ENCOUNTER — Ambulatory Visit (INDEPENDENT_AMBULATORY_CARE_PROVIDER_SITE_OTHER): Payer: Commercial Managed Care - HMO | Admitting: Pulmonary Disease

## 2015-08-28 VITALS — BP 112/72 | HR 85 | Temp 98.3°F | Ht 64.0 in | Wt 164.6 lb

## 2015-08-28 DIAGNOSIS — C7931 Secondary malignant neoplasm of brain: Secondary | ICD-10-CM

## 2015-08-28 DIAGNOSIS — F17200 Nicotine dependence, unspecified, uncomplicated: Secondary | ICD-10-CM

## 2015-08-28 DIAGNOSIS — C3412 Malignant neoplasm of upper lobe, left bronchus or lung: Secondary | ICD-10-CM | POA: Diagnosis not present

## 2015-08-28 DIAGNOSIS — J449 Chronic obstructive pulmonary disease, unspecified: Secondary | ICD-10-CM

## 2015-08-28 DIAGNOSIS — G620 Drug-induced polyneuropathy: Secondary | ICD-10-CM

## 2015-08-28 DIAGNOSIS — T451X5A Adverse effect of antineoplastic and immunosuppressive drugs, initial encounter: Secondary | ICD-10-CM

## 2015-08-28 DIAGNOSIS — C7949 Secondary malignant neoplasm of other parts of nervous system: Secondary | ICD-10-CM

## 2015-08-28 NOTE — Patient Instructions (Signed)
Today we updated your med list in our EPIC system...    Continue your current medications the same...  Today we refilled your [facility] Rx & gave you another sample...    Stay on this inhaler- one inhalation daily...  Try the [facility] 0.'5mg'$  tabs in a lower dose>>    Try 1/2 tab twice daily to see if helps your breathing while NOTworsening your dizziness...  Continue to work on smoking cessation> YOU CAN DO IT!!!  Call for any questions...  Let's plan a follow up visit in 3-32mo sooner if needed for breathing problems..Lauren KitchenMarland [name]

## 2015-08-29 ENCOUNTER — Encounter: Payer: Self-pay | Admitting: *Deleted

## 2015-08-29 ENCOUNTER — Other Ambulatory Visit: Payer: Self-pay | Admitting: Family Medicine

## 2015-08-29 ENCOUNTER — Other Ambulatory Visit: Payer: Self-pay | Admitting: *Deleted

## 2015-08-29 ENCOUNTER — Ambulatory Visit: Payer: Self-pay | Admitting: *Deleted

## 2015-08-29 NOTE — Patient Outreach (Signed)
[facility]) Care Management   08/29/2015  Lauren [name] 22-Sep-1950 [id number]  Lauren [name] is an 65 y.o. female  Subjective:   "I'm doing all right.  My vertigo is getting better since I started therapy."  Member reports that she has had a few sessions of therapy for gait and balance training, and will have more sessions the upcoming weeks.  She state they are training her on how to identify when she is going to have a vertigo spell, and interventions to stabilize herself and decrease her risk of falling.  She reports that the therapy center is working on obtaining her a rollator for when she is out of the home, and she uses a cane while in the home.  She denies any pain or discomfort at this time.  She reports taking all medications as prescribed, including monthly treatments at the cancer center.  She state she know she should quit smoking, but has no desire to do so at this time, stating "this and my pepsi are the only things that are close to my old normal life.  I can't give that up."    Objective:   Review of Systems  Constitutional: Negative.   HENT: Negative.   Eyes: Negative.   Respiratory: Negative.   Cardiovascular: Negative.   Gastrointestinal: Negative.   Genitourinary: Negative.   Musculoskeletal: Positive for falls.  Skin: Negative.   Neurological: Positive for dizziness.       Vertigo   Endo/Heme/Allergies: Negative.   Psychiatric/Behavioral: Negative.     Physical Exam  Constitutional: She is oriented to person, place, and time. She appears well-developed and well-nourished.  Neck: Normal range of motion.  Cardiovascular: Normal rate, regular rhythm and normal heart sounds.   Respiratory: Effort normal and breath sounds normal.  GI: Soft. Bowel sounds are normal.  Musculoskeletal: Normal range of motion.  Neurological: She is alert and oriented to person, place, and time.  Skin: Skin is warm and dry.    BP 118/68 mmHg  Pulse  78  Resp 18  Ht 1.626 m ('5\' 4"' )  Wt 163 lb (73.936 kg)  BMI 27.97 kg/m2  SpO2 98%   Encounter Medications:   Outpatient Encounter Prescriptions as of 08/29/2015  Medication Sig Note  . albuterol (PROVENTIL HFA;VENTOLIN HFA) 108 (90 BASE) MCG/ACT inhaler Inhale 1 puff into the lungs 2 (two) times daily. (Patient taking differently: Inhale 2 puffs into the lungs every 6 (six) hours as needed. )   . aspirin 81 MG chewable tablet Chew 81 mg by mouth daily. 06/08/2013: Pt takes chewable but swallows it whole  . clonazePAM (KLONOPIN) 0.5 MG tablet Take 0.5 mg by mouth 2 (two) times daily as needed. Reported on 08/28/2015 08/28/2015: Received from: External Pharmacy Received Sig:   . diclofenac sodium (VOLTAREN) 1 % GEL Apply 2 g topically 4 (four) times daily. As needed for pain   . FLUoxetine (PROZAC) 20 MG tablet Take 1 tablet by mouth daily. Reported on 07/31/2015 07/15/2015: Received from: External Pharmacy Received Sig:   . folic acid (FOLVITE) 1 MG tablet Take 1 tablet (1 mg total) by mouth daily.   . furosemide (LASIX) 20 MG tablet Take 1 tablet (20 mg total) by mouth daily.   Marland Kitchen lidocaine-prilocaine (EMLA) cream Apply topically as needed. Apply to porta cath site one hour prior to needle stick.   . nebivolol (BYSTOLIC) 10 MG tablet Take 1 tablet (10 mg total) by mouth daily.   . nitroGLYCERIN (NITROSTAT) 0.4 MG  SL tablet Place 1 tablet (0.4 mg total) under the tongue every 5 (five) minutes as needed. For chest pain 12/24/2014: Old prescription  . omeprazole (PRILOSEC) 40 MG capsule Take 1 capsule (40 mg total) by mouth daily.   . potassium chloride SA (K-DUR,KLOR-CON) 20 MEQ tablet TAKE 1 TABLET TWICE DAILY (Patient taking differently: TAKE 1 TABLET once DAILY)   . pravastatin (PRAVACHOL) 40 MG tablet TAKE 1 TABLET BY MOUTH ONCE DAILY   . tiZANidine (ZANAFLEX) 2 MG tablet Take 1 tablet (2 mg total) by mouth every 6 (six) hours as needed (Back Muscle Spasm). 08/01/2015: Infrequent use  . traMADol  (ULTRAM) 50 MG tablet Take 1 tablet (50 mg total) by mouth every 6 (six) hours as needed for moderate pain or severe pain.   Marland Kitchen umeclidinium-vilanterol (ANORO ELLIPTA) 62.5-25 MCG/INH AEPB Inhale 1 puff into the lungs daily.    No facility-administered encounter medications on file as of 08/29/2015.    Functional Status:   In your present state of health, do you have any difficulty performing the following activities: 08/29/2015 08/27/2015  Hearing? N N  Vision? Y Y  Difficulty concentrating or making decisions? - Y  Walking or climbing stairs? - Y  Dressing or bathing? - N  Doing errands, shopping? - Y  Preparing Food and eating ? N -  Using the Toilet? N -  In the past six months, have you accidently leaked urine? Y -  Do you have problems with loss of bowel control? N -  Managing your Medications? N -  Managing your Finances? N -  Housekeeping or managing your Housekeeping? Y -    Fall/Depression Screening:    PHQ 2/9 Scores 08/27/2015 08/01/2015 07/31/2015 07/03/2015 06/04/2015 06/04/2015 02/08/2015  PHQ - 2 Score 2 0 0 1 0 0 1  PHQ- 9 Score 4 - - - - - -  Exception Documentation - - Patient refusal - - - -   Fall Risk  08/29/2015 08/15/2015 08/01/2015 07/31/2015 07/03/2015  Falls in the past year? Yes Yes Yes Yes Yes  Number falls in past yr: 2 or more 2 or more 2 or more 2 or more 2 or more  Injury with Fall? No No No No No  Risk Factor Category  High Fall Risk High Fall Risk High Fall Risk High Fall Risk High Fall Risk  Risk for fall due to : History of fall(s);Impaired balance/gait - History of fall(s);Impaired balance/gait History of fall(s) Impaired balance/gait  Risk for fall due to (comments): - - - - -  Follow up Falls prevention discussed - - Falls evaluation completed Falls evaluation completed;Education provided;Falls prevention discussed    Assessment:    Met with member at scheduled time for initial home visit and safety evaluation.  Member steady on cane, using it with her  dominant hand (right) but state that she tend to lean to the left side when she is having her dizzy spells.  She reports that when she is out of the home, she make sure that she has a friend/family member on the left side for precaution.  Her next therapy appointment is next week.  Home evaluation complete.  Member has 2 steps in the front of her apartment and 3 steps in the back.  She states she is unsure if she is able to have a ramp, this care manager will discuss with social worker, [employer], for community resources.  Member has multiple rugs around the home, most with non-skid backing.  Advised to  discard of the ones with non-skid backing and the ones that are curling at the edges to decrease risk of fall.  Discussed the importance of maintaining a clear walking path and having the home free of clutter.  She state that she need to do spring cleaning, but advised to wait until her friend is able to assist.  She verbalizes understanding.  Bathroom evaluated, light switch located behind door, but has a sensor where the light turns on upon entry. Grab bars noted on the inside and outside of the bathtub, but none around the commode.  She demonstrates how she get up from a seated position, raised toilet set recommended.  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  She reports sitting in the tub frequently to soften her skin, but state it is very difficult to get out.  Advised to only sit in the tub when her daughter (or friend) is able to assist her with getting in and out to decrease fall risk.  She inquires about equipment that would assist her being able to sit in the tub, will discuss medical equipment with social worker.    Member also inquires about having a personal care aide to assist her around the home.  She states that she is able to stand and fix small meals, but is unable to cook full meals, nor is she able to manage all household needs, such as vacuuming.  She state she did  not speak to Education officer, museum about this during their visit.  Will discuss.  She would also like information on a life alert to have for emergencies.  Denies any further questions, contact information provided.  Encouraged to contact with concerns.    Plan:   Will collaborate with social worker regarding additional needs for personal care services and medical equipment to decrease fall risk. Will follow up with member within 2 weeks.  THN CM Care Plan Problem One        Most Recent Value   Care Plan Problem One  Frequent falls   Role Documenting the Problem One  Care Management [place] for Problem One  Active   THN Long Term Goal (31-90 days)  Member will be free from falls within the next 31 days   THN Long Term Goal Start Date  08/29/15   Interventions for Problem One Long Term Goal  Fall prevention discussed, home safety evaluation complete.    THN CM Short Term Goal #1 (0-30 days)  Member will have rolling walker within the next 4 weeks   THN CM Short Term Goal #1 Start Date  08/29/15   Interventions for Short Term Goal #1  Verified that member's therapy center will assist with obtaining walker.  Discussed proper use of walker and use of it in and out of the home   THN CM Short Term Goal #2 (0-30 days)  Member will have raised toilet seat within the next 4 weeks   THN CM Short Term Goal #2 Start Date  08/29/15   Interventions for Short Term Goal #2  Assessment of bathroom complete, discussed how a raised toilet seat will help decrease the risk of fall in bathroom     Valente David, BSN, [place] Manager [phone]

## 2015-08-30 ENCOUNTER — Encounter: Payer: Self-pay | Admitting: Family Medicine

## 2015-08-30 DIAGNOSIS — E559 Vitamin D deficiency, unspecified: Secondary | ICD-10-CM

## 2015-08-30 HISTORY — DX: Vitamin D deficiency, unspecified: E55.9

## 2015-08-30 MED ORDER — VITAMIN D (ERGOCALCIFEROL) 1.25 MG (50000 UNIT) PO CAPS: 50000.0000 [IU] | ORAL_CAPSULE | ORAL | Status: DC

## 2015-09-02 ENCOUNTER — Other Ambulatory Visit: Payer: Self-pay | Admitting: Licensed Clinical Social Worker

## 2015-09-02 NOTE — Patient Outreach (Signed)
[place] [facility]) Care Management  09/02/2015  Lauren [name] Nov 14, 1950 [id number]   Assessment-CSW received update from [facility] with updates on patient and her needs. Patient is unable to cook for herself as she cannot stand up long and is in need of resources. She did not qualify for Medicaid and only has Medicaid MQB so she would not be able to gain an aide through personal care services. However, the [place] may be an option. CSW completed outreach to patient. Patient is not agreeable to referral even after explaining that the cost of services is based on her income. Patient is agreeable to a [employer] referral. [place] educated her on the process of gaining services. Patient is in need of medical equipment for when she takings a bath. CSW will discuss further with RNCM. Patient also may need a ramp even after gaining walker. CSW will contact [employer]. Patient wishes to gain information on medical alerts. Patient shares that she went to [facility] and saw that the cost of one was 19.99 and the monthly fee was 30.00. Patient shares that she is interested in more information but is unsure if she can afford it. CSW will mail out information on medical alerts to her residence.   CSW completed call to [facility] and updated her. RNCM will contact PCP to discuss need for medical equipment.  CSW completed call to [facility] for [employer] and left a HIPPA compliant message with [employer].  CSW completed call to [employer] and made referral.  Plan-CSW will send request to [place] Management Assistant to mail out community resource. CSW will await to hear back from [employer].  [name], BSW, MSW, [email or web address] Phone: [phone] Fax: [phone]

## 2015-09-03 ENCOUNTER — Ambulatory Visit: Payer: Commercial Managed Care - HMO

## 2015-09-03 DIAGNOSIS — R42 Dizziness and giddiness: Secondary | ICD-10-CM | POA: Diagnosis not present

## 2015-09-03 DIAGNOSIS — R2689 Other abnormalities of gait and mobility: Secondary | ICD-10-CM

## 2015-09-03 DIAGNOSIS — H8113 Benign paroxysmal vertigo, bilateral: Secondary | ICD-10-CM | POA: Diagnosis not present

## 2015-09-03 NOTE — Patient Instructions (Signed)
Tip Card 1.The goal of habituation training is to assist in decreasing symptoms of vertigo, dizziness, or nausea provoked by specific head and body motions. 2.These exercises may initially increase symptoms; however, be persistent and work through symptoms. With repetition and time, the exercises will assist in reducing or eliminating symptoms. 3.Exercises should be stopped and discussed with the therapist if you experience any of the following: - Sudden change or fluctuation in hearing - New onset of ringing in the ears, or increase in current intensity - Any fluid discharge from the ear - Severe pain in neck or back - Extreme nausea  Copyright  VHI. All rights reserved.  Rolling    With 2 pillows under head, start on back. Turn your head to the right and wait until dizziness stops, count to 20 seconds. Then return to middle. Rest and let symptoms settle. Turn your head ro the left and wait until dizziness stops, count to 20 seconds. Perform 2 times a day. Perform 7 days a week. Copyright  VHI. All rights reserved.   Sit to Side-Lying   Sit on edge of bed. Lie down onto the right side (with 1 pillow) and hold until dizziness stops, plus 20 seconds. Return to sitting and wait until dizziness stops, plus 20 seconds. Repeat to the left side (with 2 pillows). Repeat sequence 5 times per session. Do 2 sessions per day. Perform 7 days a week.  Copyright  VHI. All rights reserved.  Gaze Stabilization: Tip Card 1.Target must remain in focus, not blurry, and appear stationary while head is in motion. 2.Perform exercises with small head movements (45 to either side of midline). 3.Increase speed of head motion so long as target is in focus. 4.If you wear eyeglasses, be sure you can see target through lens (therapist will give specific instructions for bifocal / progressive lenses). 5.These exercises may provoke dizziness or nausea. Work through these symptoms. If too dizzy, slow head  movement slightly. Rest between each exercise. 6.Exercises demand concentration; avoid distractions. 7.For safety, perform standing exercises close to a counter, wall, corner, or next to someone.  Copyright  VHI. All rights reserved.  Gaze Stabilization: Standing Feet Apart   Feet shoulder width apart, keeping eyes on target on wall 3 feet away, tilt head down slightly and move head side to side for 30 seconds. Repeat while moving head up and down for 30 seconds.  Do 2 sessions per day. Perform 7 days a week.    Copyright  VHI. All rights reserved

## 2015-09-03 NOTE — Therapy (Signed)
[facility] [address] [place], [place], [address] Phone: [phone]   Fax:  [phone]  Physical Therapy Treatment  Patient Details  Name: Lauren [name] MRN: [id number] Date of Birth: 26-Apr-1951 Referring Provider: Dr. Wendy Poet  Encounter Date: 09/03/2015      PT End of Session - 09/03/15 1117    Visit Number 4   Number of Visits 9   Date for PT Re-Evaluation 09/15/15   PT Start Time 0935   PT Stop Time 1015   PT Time Calculation (min) 40 min   Equipment Utilized During Treatment Gait belt   Activity Tolerance Patient tolerated treatment well   Behavior During Therapy [facility] for tasks assessed/performed      Past Medical History  Diagnosis Date  . Depression   . Hyperlipidemia   . Hypertension   . Lung nodule     FNA ordered for 04/02/10 by HA>pos Ca  . Chronic folliculitis     of groin  . Fibrocystic breast changes   . Family history of trichomonal vaginitis 05/2005  . Postmenopausal   . Depressive disorder   . Hypercholesterolemia   . GERD (gastroesophageal reflux disease)   . Cerebral aneurysm   . Back pain   . Shortness of breath   . COPD (chronic obstructive pulmonary disease) ([place])   . Coronary artery disease   . Arthritis   . Weight loss 10/22/2011  . Status post radiation therapy 11/16/11 - 12/29/11    Right Lung and Mediastinum: 60 Gy  . Status post chemotherapy comp. 12/29/11    Carboplatin/Taxol  . S/P radiation therapy 03/01/12    SRS: 1 fraction / 20 Gray each to the Left Occipital Region and to the Right Insular Metastases  . On antineoplastic chemotherapy started 02/2012    [place]  . Fatigue 02/06/2013  . Pleural effusion 05/03/2013  . Blurred vision, bilateral 06/06/2014  . Colon cancer ([place]) 11/08  . Lung cancer ([place]) 06/16/10    PET scan 04/28/2010; primary: increase in size 02/2010 / Well Differentiated Adenocarcinoma of the lung   . Brain metastases ([place]) 02/15/12  . Other  fatigue 11/28/2014  . Bilateral pleural effusion 08/09/2013  . POSTMENOPAUSAL SYNDROME 01/31/2009    Qualifier: Diagnosis of  By: Carlena Sax  MD, Colletta [place]    . Hypokalemia 08/09/2013  . 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 Bilateral leg edema 10/17/2014  . 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 Pleural effusion, malignant 05/2013    Recurrent Pleural Effusion  . Status post stereotactic radiosurgery 01/2012    for Brain Metastases  . Tobacco dependence   . Family history of Huntington's disease   . Acute on chronic respiratory failure with hypoxemia ([place]) 07/11/2015  . Falls frequently 08/02/2015  . Benign paroxysmal positional vertigo 08/16/2015  . Vitamin D deficiency 08/30/2015    Past Surgical History  Procedure Laterality Date  . Colectomy  03/22/07    Stage 1 pT2 N0, M0 Adenocarcinoma of the sigmoid  colon  . Tubal ligation    . Lung lobectomy  06/16/10    Left Upper Lobectomy  . Hernia repair    . Breast surgery      Bil lumpectomy  . Back surgery      Dr Luiz Ochoa  . Mediastinoscopy  10/19/2011    Procedure: MEDIASTINOSCOPY;  Surgeon: Gaye Pollack, MD;  Location: [facility] OR;  Service: Thoracic;  Laterality: N/A;  . Cardiac cath x3    . Chest tube insertion Right 06/12/2013  Procedure: INSERTION PLEURAL DRAINAGE CATHETER;  Surgeon: Gaye Pollack, MD;  Location: [facility];  Service: Thoracic;  Laterality: Right;  . Talc pleurodesis Right 06/12/2013    Procedure: Pietro Cassis;  Surgeon: Gaye Pollack, MD;  Location: [facility];  Service: Thoracic;  Laterality: Right;  . Tunneled venous catheter placement      Port-a-Cath    There were no vitals filed for this visit.      Subjective Assessment - 09/03/15 0938    Subjective Pt reported vertigo is getting better. pt reported she is using cream MD prescribed for feet and it has been helping. Pt still feels tired.    Patient is accompained by: Family member   Pertinent History Active lung CA and pt currently smokes, hx of colon and lung CA  with brain mets, LVH, cerebral aneurysm, COPD, neuropathy from chemo, depressive disorder, LBP with sciatica, arthritis, HTN   Patient Stated Goals To be normal again, and IND. "I want to be in control again". Stop dizziness.   Currently in Pain? No/denies           Therex: Pt performed former and progressed HEP with cues for technique. Please see pt instructions for details. Pt reported 0/10 dizziness during R sidelying and 2/10 dizziness during L sidelying.                 Self Care:     PT Education - 09/03/15 1115    Education provided Yes   Education Details PT progressed current gaze stab. and habituation HEP and educated pt on tip cards. PT also educated pt on eventually re-testing for BPPV and attempting to treat if tolerated, as pt reported dizziness is slowly getting better. PT educated pt on performing treatment in order to attempt to resolve BPPV. PT also educated pt on the importance of performing HEP daily vs. 2-3 times/week.   Person(s) Educated Patient;Other (comment)  sister-[name]   Methods Explanation;Demonstration;Verbal cues;Handout   Comprehension Returned demonstration;Verbalized understanding;Need further instruction          PT Short Term Goals - 08/16/15 1634    PT SHORT TERM GOAL #1   Title same as LTGs.            PT Long Term Goals - 08/26/15 0940    PT LONG TERM GOAL #1   Title Pt will be IND in HEP to improve balance and decr. dizziness to improve functional mobility. Target date: 09/13/15   Status On-going   PT LONG TERM GOAL #2   Title Pt will improve gait speed with LRAD to >/=1.31f/sec. to decrease falls risk.Target date: 09/13/15   Status On-going   PT LONG TERM GOAL #3   Title Perform BERG and write goal if appropriate. Pt will improve BERG score to >/=45/56 to decr. risk of falls. Target date: 09/13/15   Status Revised   PT LONG TERM GOAL #4   Title Pt will report </=2/10 dizziness when turning to R side while in  supine position to improve safety during functional mobility. Target date: 09/13/15   Status On-going   PT LONG TERM GOAL #5   Title Pt will amb. 500' with LRAD over even and paved surfaces at MOD I level to improve functional moiblity. Target date: 09/13/15   Status On-going   PT LONG TERM GOAL #6   Title Pt will improve ABC score from 1.9% to 16% to improve quality of life and confidence during gait. Target date: 09/13/15   Status On-going  Plan - 09/03/15 1118    Clinical Impression Statement Pt demonstrated progress, as she was able to tolerate HEP with less pillows today and pt reports intensity of dizziness was 1-2/10 during HEP. Pt amb. 30' after HEP and did not experience any LOB. Pt noted to experience apogeotropic nystagmus when performing rolling to R and L side, with incr. intensity noted on R side (2/10) and 1/10 on L side. Symptoms consistent with hBPPV. However, it is difficult to assess canal laterality as pt does not wish to perform testing 2/2 nausea/dizziness. Pt would benefit from skilled PT to improve safety during functional mobility.   Rehab Potential Fair   Clinical Impairments Affecting Rehab Potential co-morbidities   PT Frequency 2x / week   PT Duration 4 weeks   PT Treatment/Interventions ADLs/Self Care Home Management;Biofeedback;Canalith Repostioning;Balance training;Manual techniques;Therapeutic exercise;Vestibular;Therapeutic activities;Functional mobility training;Stair training;Gait training;DME Instruction;Patient/family education;Orthotic Fit/Training;Cognitive remediation;Neuromuscular re-education   PT Next Visit Plan provide balance HEP.  *treat BPPV to tolerance.   PT Home Exercise Plan Habituation and gaze stab. HEP   Consulted and Agree with Plan of Care Patient;Family member/caregiver   Family Member Consulted sister      Patient will benefit from skilled therapeutic intervention in order to improve the following deficits and  impairments:  Abnormal gait, Dizziness, Postural dysfunction, Decreased cognition, Decreased balance, Decreased mobility, Decreased knowledge of use of DME, Decreased strength  Visit Diagnosis: Dizziness and giddiness  BPPV (benign paroxysmal positional vertigo), bilateral  Other abnormalities of gait and mobility     Problem List Patient Active Problem List   Diagnosis Date Noted  . Vitamin D deficiency 08/30/2015  . Mild cognitive impairment 08/16/2015  . Benign paroxysmal positional vertigo 08/16/2015  . Falls frequently 08/02/2015  . COPD mixed type ([place]) 07/15/2015  . Dyspnea 07/15/2015  . Acute on chronic respiratory failure with hypoxemia ([place]) 07/11/2015  . Postural dizziness 06/19/2015  . Protein calorie malnutrition ([place]) 03/14/2015  . Family history of Huntington's disease   . LVH (left ventricular hypertrophy) 12/20/2014  . Other fatigue 11/28/2014  . Blepharitis of both eyes 11/09/2014  . Bilateral leg edema 10/17/2014  . Anemia due to chemotherapy 08/29/2014  . Neuropathy due to chemotherapeutic drug ([place]) 07/19/2014  . Left-sided low back pain with left-sided sciatica 07/18/2014  . Low back pain with sciatica 06/22/2014  . Colon cancer ([place]) 05/31/2013  . Fatigue 02/06/2013  . ASCUS with positive high risk HPV 04/21/2012  . Brain metastases treated with radiosurgery 02/26/2012  . Health care maintenance 01/20/2012  . Insomnia 10/05/2011  . Cancer of upper lobe of left lung, Adenocarcinoma   . TOBACCO ABUSE 10/04/2007  . Coronary atherosclerosis 09/09/2007  . HYPERCHOLESTEROLEMIA 12/29/2006  . DEPRESSIVE DISORDER, MAJOR, RCR, MILD 12/29/2006  . Essential hypertension 12/29/2006  . CEREBRAL ANEURYSM 12/29/2006  . GERD 12/29/2006    Simon Aaberg L 09/03/2015, 11:20 AM  [facility] [address] [place], [place], [address] Phone: [phone]   Fax:  [phone]  Name: DIANIA CO MRN: [id number] Date of Birth: [birth year]    Geoffry Paradise, PT,DPT 09/03/2015 11:20 AM Phone: [phone] Fax: [phone]

## 2015-09-03 NOTE — Patient Outreach (Signed)
[facility]) Care Management  09/03/2015  VELVET MOOMAW 1951-04-25 [id number]   Request received from [name], LCSW to mail patient information about med alert services. Information mailed today 09/03/15.

## 2015-09-04 ENCOUNTER — Other Ambulatory Visit: Payer: Self-pay | Admitting: Licensed Clinical Social Worker

## 2015-09-04 ENCOUNTER — Other Ambulatory Visit: Payer: Self-pay | Admitting: *Deleted

## 2015-09-04 NOTE — Patient Outreach (Signed)
  [facility]) Care Management  09/04/2015  Lauren [name] May 31, 1950 [id number]   Assessment-CSW did not receive a return call from Lauren [name] at [employer] and completed an additional outreach on 09/04/15. CSW left a HIPPA compliant voice message requesting return call.  Plan-CSW will await return call and will send out ramp resources to patient's residence by sending in basket request to [place] Management Assistant.  Lauren [name], BSW, MSW, [name] Phone: [phone] Fax: [phone]

## 2015-09-04 NOTE — Patient Outreach (Signed)
   Addendum-CSW received return call from [place] at [employer]. Vaughan Basta shares that with [facility] contract she will need to clarify with her another staff member the contract details as their eligibility requirement may differ. CSW provided name, address, income to [place]. Vaughan Basta will return call tomorrow to update CSW.  Plan-CSW will continue to work with [employer], permission given to CSW to do so. CSW will await for return call from [place] with [employer].  [name], BSW, MSW, [email or web address] Phone: [phone] Fax: [phone]

## 2015-09-05 ENCOUNTER — Ambulatory Visit: Payer: Commercial Managed Care - HMO

## 2015-09-05 ENCOUNTER — Telehealth: Payer: Self-pay

## 2015-09-05 DIAGNOSIS — R269 Unspecified abnormalities of gait and mobility: Secondary | ICD-10-CM

## 2015-09-05 DIAGNOSIS — R42 Dizziness and giddiness: Secondary | ICD-10-CM | POA: Diagnosis not present

## 2015-09-05 DIAGNOSIS — H8113 Benign paroxysmal vertigo, bilateral: Secondary | ICD-10-CM | POA: Diagnosis not present

## 2015-09-05 DIAGNOSIS — R2689 Other abnormalities of gait and mobility: Secondary | ICD-10-CM | POA: Diagnosis not present

## 2015-09-05 NOTE — Therapy (Signed)
[facility] [address] [place], [place], [address] Phone: [phone]   Fax:  [phone]  Physical Therapy Treatment  Patient Details  Name: Lauren [name] MRN: [id number] Date of Birth: Feb 24, 1951 Referring Provider: Dr. Wendy Poet  Encounter Date: 09/05/2015      PT End of Session - 09/05/15 1236    Visit Number 5   Number of Visits 9   Date for PT Re-Evaluation 09/15/15   Authorization Type G-code and progress note every 10th visit. No auth require per Lattie Haw   PT Start Time [phone]   PT Stop Time 0929   PT Time Calculation (min) 43 min   Equipment Utilized During Treatment Gait belt   Activity Tolerance Patient limited by fatigue   Behavior During Therapy [facility] for tasks assessed/performed      Past Medical History  Diagnosis Date  . Depression   . Hyperlipidemia   . Hypertension   . Lung nodule     FNA ordered for 04/02/10 by HA>pos Ca  . Chronic folliculitis     of groin  . Fibrocystic breast changes   . Family history of trichomonal vaginitis 05/2005  . Postmenopausal   . Depressive disorder   . Hypercholesterolemia   . GERD (gastroesophageal reflux disease)   . Cerebral aneurysm   . Back pain   . Shortness of breath   . COPD (chronic obstructive pulmonary disease) ([place])   . Coronary artery disease   . Arthritis   . Weight loss 10/22/2011  . Status post radiation therapy 11/16/11 - 12/29/11    Right Lung and Mediastinum: 60 Gy  . Status post chemotherapy comp. 12/29/11    Carboplatin/Taxol  . S/P radiation therapy 03/01/12    SRS: 1 fraction / 20 Gray each to the Left Occipital Region and to the Right Insular Metastases  . On antineoplastic chemotherapy started 02/2012    [place]  . Fatigue 02/06/2013  . Pleural effusion 05/03/2013  . Blurred vision, bilateral 06/06/2014  . Colon cancer ([place]) 11/08  . Lung cancer ([place]) 06/16/10    PET scan 04/28/2010; primary: increase in size 02/2010 / Well  Differentiated Adenocarcinoma of the lung   . Brain metastases ([place]) 02/15/12  . Other fatigue 11/28/2014  . Bilateral pleural effusion 08/09/2013  . POSTMENOPAUSAL SYNDROME 01/31/2009    Qualifier: Diagnosis of  By: Carlena Sax  MD, Colletta [place]    . Hypokalemia 08/09/2013  . 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 Bilateral leg edema 10/17/2014  . 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 Pleural effusion, malignant 05/2013    Recurrent Pleural Effusion  . Status post stereotactic radiosurgery 01/2012    for Brain Metastases  . Tobacco dependence   . Family history of Huntington's disease   . Acute on chronic respiratory failure with hypoxemia ([place]) 07/11/2015  . Falls frequently 08/02/2015  . Benign paroxysmal positional vertigo 08/16/2015  . Vitamin D deficiency 08/30/2015    Past Surgical History  Procedure Laterality Date  . Colectomy  03/22/07    Stage 1 pT2 N0, M0 Adenocarcinoma of the sigmoid  colon  . Tubal ligation    . Lung lobectomy  06/16/10    Left Upper Lobectomy  . Hernia repair    . Breast surgery      Bil lumpectomy  . Back surgery      Dr Luiz Ochoa  . Mediastinoscopy  10/19/2011    Procedure: MEDIASTINOSCOPY;  Surgeon: Gaye Pollack, MD;  Location: [place];  Service: Thoracic;  Laterality: N/A;  . Cardiac cath x3    . Chest tube insertion Right 06/12/2013    Procedure: INSERTION PLEURAL DRAINAGE CATHETER;  Surgeon: Gaye Pollack, MD;  Location: [facility];  Service: Thoracic;  Laterality: Right;  . Talc pleurodesis Right 06/12/2013    Procedure: Pietro Cassis;  Surgeon: Gaye Pollack, MD;  Location: [place];  Service: Thoracic;  Laterality: Right;  . Tunneled venous catheter placement      Port-a-Cath    There were no vitals filed for this visit.      Subjective Assessment - 09/05/15 0849    Subjective Pt reported she still feels tired, she believes it might be because she's waiting for her 5000units of Vitamin D prescription. Pt denied falls since last visit.    Pertinent History  Active lung CA and pt currently smokes, hx of colon and lung CA with brain mets, LVH, cerebral aneurysm, COPD, neuropathy from chemo, depressive disorder, LBP with sciatica, arthritis, HTN   Patient Stated Goals To be normal again, and IND. "I want to be in control again". Stop dizziness.   Currently in Pain? No/denies          Neuro re-ed: Pt performed balance activities (feet together/apart, eye open/closed on compliant/non-compliant surfaces) with S to min guard, in corner with chair in front for safety. Please see pt instructions for details.               [facility] Adult PT Treatment/Exercise - 09/05/15 0924    Ambulation/Gait   Ambulation/Gait Yes   Ambulation/Gait Assistance 5: Supervision   Ambulation/Gait Assistance Details Cues to improve lateral wt. shifting onto LLE, to improve R step length. Cues to improve heel strike. Pt required two standing rest breaks and one seated rest break 2/2 fatigue.   Ambulation Distance (Feet) 230 Feet   Assistive device Rollator   Gait Pattern Decreased stride length;Decreased dorsiflexion - left;Step-through pattern;Shuffle;Narrow base of support   Ambulation Surface Level;Indoor           Self Care:     PT Education - 09/05/15 1235    Education provided Yes   Education Details PT provided pt with balance HEP. Pt re-educated pt on obtaining rollator and how to properly use rollator for safety.    Person(s) Educated Patient   Methods Explanation;Demonstration;Verbal cues;Handout   Comprehension Verbalized understanding;Returned demonstration;Need further instruction          PT Short Term Goals - 08/16/15 1634    PT SHORT TERM GOAL #1   Title same as LTGs.            PT Long Term Goals - 08/26/15 0940    PT LONG TERM GOAL #1   Title Pt will be IND in HEP to improve balance and decr. dizziness to improve functional mobility. Target date: 09/13/15   Status On-going   PT LONG TERM GOAL #2   Title Pt will improve  gait speed with LRAD to >/=1.27f/sec. to decrease falls risk.Target date: 09/13/15   Status On-going   PT LONG TERM GOAL #3   Title Perform BERG and write goal if appropriate. Pt will improve BERG score to >/=45/56 to decr. risk of falls. Target date: 09/13/15   Status Revised   PT LONG TERM GOAL #4   Title Pt will report </=2/10 dizziness when turning to R side while in supine position to improve safety during functional mobility. Target date: 09/13/15   Status On-going   PT LONG TERM GOAL #5  Title Pt will amb. 500' with LRAD over even and paved surfaces at MOD I level to improve functional moiblity. Target date: 09/13/15   Status On-going   PT LONG TERM GOAL #6   Title Pt will improve ABC score from 1.9% to 16% to improve quality of life and confidence during gait. Target date: 09/13/15   Status On-going               Plan - 09/05/15 1236    Clinical Impression Statement Pt limited by fatigue today, as she required more frequent and longer duration rest breaks during session. Pt tolerated PT session without provoking dizziness and reported only one brief dizziness episode this week. Continue with POC.    Rehab Potential Fair   Clinical Impairments Affecting Rehab Potential co-morbidities   PT Frequency 2x / week   PT Duration 4 weeks   PT Treatment/Interventions ADLs/Self Care Home Management;Biofeedback;Canalith Repostioning;Balance training;Manual techniques;Therapeutic exercise;Vestibular;Therapeutic activities;Functional mobility training;Stair training;Gait training;DME Instruction;Patient/family education;Orthotic Fit/Training;Cognitive remediation;Neuromuscular re-education   PT Next Visit Plan Review balance HEP, treat BPPV tol tolerance. Then begin to check STGs.   PT Home Exercise Plan Habituation and gaze stab. HEP   Consulted and Agree with Plan of Care Patient;Family member/caregiver   Family Member Consulted sister      Patient will benefit from skilled therapeutic  intervention in order to improve the following deficits and impairments:  Abnormal gait, Dizziness, Postural dysfunction, Decreased cognition, Decreased balance, Decreased mobility, Decreased knowledge of use of DME, Decreased strength  Visit Diagnosis: Other abnormalities of gait and mobility  Dizziness and giddiness     Problem List Patient Active Problem List   Diagnosis Date Noted  . Vitamin D deficiency 08/30/2015  . Mild cognitive impairment 08/16/2015  . Benign paroxysmal positional vertigo 08/16/2015  . Falls frequently 08/02/2015  . COPD mixed type ([place]) 07/15/2015  . Dyspnea 07/15/2015  . Acute on chronic respiratory failure with hypoxemia ([place]) 07/11/2015  . Postural dizziness 06/19/2015  . Protein calorie malnutrition ([place]) 03/14/2015  . Family history of Huntington's disease   . LVH (left ventricular hypertrophy) 12/20/2014  . Other fatigue 11/28/2014  . Blepharitis of both eyes 11/09/2014  . Bilateral leg edema 10/17/2014  . Anemia due to chemotherapy 08/29/2014  . Neuropathy due to chemotherapeutic drug ([place]) 07/19/2014  . Left-sided low back pain with left-sided sciatica 07/18/2014  . Low back pain with sciatica 06/22/2014  . Colon cancer ([place]) 05/31/2013  . Fatigue 02/06/2013  . ASCUS with positive high risk HPV 04/21/2012  . Brain metastases treated with radiosurgery 02/26/2012  . Health care maintenance 01/20/2012  . Insomnia 10/05/2011  . Cancer of upper lobe of left lung, Adenocarcinoma   . TOBACCO ABUSE 10/04/2007  . Coronary atherosclerosis 09/09/2007  . HYPERCHOLESTEROLEMIA 12/29/2006  . DEPRESSIVE DISORDER, MAJOR, RCR, MILD 12/29/2006  . Essential hypertension 12/29/2006  . CEREBRAL ANEURYSM 12/29/2006  . GERD 12/29/2006    Kayda Allers L 09/05/2015, 12:39 PM  [facility] [address] [place], [place], [address] Phone: [phone]   Fax:  [phone]  Name: Lauren [name] MRN: [id number] Date of Birth: Oct 07, 1950    Geoffry Paradise, PT,DPT 09/05/2015 12:39 PM Phone: [phone] Fax: [phone]

## 2015-09-05 NOTE — Telephone Encounter (Signed)
Dr. McDiarmid~  Ms. Cadmus demonstrated improve stride length, heel strike, upright posture and endurance during ambulation with a rollator. I feel she would benefit from a rollator, if you agree please send [place] a prescription for a rollator.  Thank you, Geoffry Paradise, PT,DPT 09/05/2015 3:52 PM Phone: [phone] Fax: [phone]

## 2015-09-05 NOTE — Patient Instructions (Signed)
Perform in a corner with a chair in front of you OR at kitchen sink with a chair behind you for safety:  Feet Apart (Compliant Surface) Varied Arm Positions - Eyes Closed    Stand on compliant surface: __pillow/cushion______ with feet shoulder width apart and arms at your side. Close eyes and visualize upright position. Hold__10-30__ seconds. Repeat __3__ times per session. Do _1___ sessions per day.  Copyright  VHI. All rights reserved.  Single Leg - Eyes Closed    While standing on left leg, close eyes and maintain balance. Hold__10__ seconds. Switch and stand on right leg for 10 seconds. Repeat _3___ times per session per leg. Do __1__ sessions per day.  Copyright  VHI. All rights reserved.  Feet Together, Head Motion - Eyes Open    With eyes open, feet together, move head slowly: up and down 5 times and side to side 5 times. Repeat __3__ times per session. Do __1__ sessions per day.  Copyright  VHI. All rights reserved.

## 2015-09-09 ENCOUNTER — Ambulatory Visit: Payer: Self-pay | Admitting: Rehabilitative and Restorative Service Providers"

## 2015-09-10 ENCOUNTER — Ambulatory Visit (HOSPITAL_COMMUNITY)
Admission: RE | Admit: 2015-09-10 | Discharge: 2015-09-10 | Disposition: A | Discharge status: Home / Self Care | Payer: Commercial Managed Care - HMO | Source: Ambulatory Visit | Attending: Hematology and Oncology | Admitting: Hematology and Oncology

## 2015-09-10 ENCOUNTER — Encounter (HOSPITAL_COMMUNITY): Payer: Self-pay

## 2015-09-10 DIAGNOSIS — R269 Unspecified abnormalities of gait and mobility: Secondary | ICD-10-CM | POA: Insufficient documentation

## 2015-09-10 DIAGNOSIS — C7802 Secondary malignant neoplasm of left lung: Secondary | ICD-10-CM | POA: Diagnosis not present

## 2015-09-10 DIAGNOSIS — C3412 Malignant neoplasm of upper lobe, left bronchus or lung: Secondary | ICD-10-CM | POA: Diagnosis not present

## 2015-09-10 DIAGNOSIS — C7801 Secondary malignant neoplasm of right lung: Secondary | ICD-10-CM | POA: Diagnosis not present

## 2015-09-10 DIAGNOSIS — R918 Other nonspecific abnormal finding of lung field: Secondary | ICD-10-CM | POA: Diagnosis not present

## 2015-09-10 HISTORY — DX: Unspecified abnormalities of gait and mobility: R26.9

## 2015-09-10 MED ORDER — IOPAMIDOL (ISOVUE-300) INJECTION 61%
75.0000 mL | Freq: Once | INTRAVENOUS | Status: AC | PRN
  Administered 2015-09-10: 75 mL via INTRAVENOUS

## 2015-09-10 NOTE — Telephone Encounter (Signed)
As I am not certain that PT can take a verbal order for DME, pkease ask Dr Erin Hearing if he would write a prescription for a Rollator walker for patient.  Indication: Abnormal Gait (R26.9). Fax presciption to [employer], PT .

## 2015-09-11 ENCOUNTER — Other Ambulatory Visit: Payer: Self-pay | Admitting: *Deleted

## 2015-09-11 ENCOUNTER — Ambulatory Visit (HOSPITAL_BASED_OUTPATIENT_CLINIC_OR_DEPARTMENT_OTHER): Payer: Commercial Managed Care - HMO | Admitting: Hematology and Oncology

## 2015-09-11 ENCOUNTER — Other Ambulatory Visit: Payer: Self-pay | Admitting: Hematology and Oncology

## 2015-09-11 ENCOUNTER — Encounter: Payer: Self-pay | Admitting: Hematology and Oncology

## 2015-09-11 ENCOUNTER — Ambulatory Visit: Payer: Self-pay

## 2015-09-11 ENCOUNTER — Other Ambulatory Visit: Payer: Self-pay

## 2015-09-11 ENCOUNTER — Telehealth: Payer: Self-pay | Admitting: Hematology and Oncology

## 2015-09-11 ENCOUNTER — Telehealth: Payer: Self-pay | Admitting: *Deleted

## 2015-09-11 VITALS — BP 141/82 | HR 77 | Temp 97.7°F | Resp 18 | Ht 64.0 in | Wt 167.8 lb

## 2015-09-11 DIAGNOSIS — Z5181 Encounter for therapeutic drug level monitoring: Secondary | ICD-10-CM | POA: Insufficient documentation

## 2015-09-11 DIAGNOSIS — E440 Moderate protein-calorie malnutrition: Secondary | ICD-10-CM

## 2015-09-11 DIAGNOSIS — F17200 Nicotine dependence, unspecified, uncomplicated: Secondary | ICD-10-CM

## 2015-09-11 DIAGNOSIS — C7949 Secondary malignant neoplasm of other parts of nervous system: Secondary | ICD-10-CM

## 2015-09-11 DIAGNOSIS — C7931 Secondary malignant neoplasm of brain: Secondary | ICD-10-CM | POA: Diagnosis not present

## 2015-09-11 DIAGNOSIS — C3412 Malignant neoplasm of upper lobe, left bronchus or lung: Secondary | ICD-10-CM | POA: Diagnosis not present

## 2015-09-11 DIAGNOSIS — Z720 Tobacco use: Secondary | ICD-10-CM | POA: Diagnosis not present

## 2015-09-11 DIAGNOSIS — Z5111 Encounter for antineoplastic chemotherapy: Secondary | ICD-10-CM | POA: Insufficient documentation

## 2015-09-11 HISTORY — DX: Moderate protein-calorie malnutrition: E44.0

## 2015-09-11 HISTORY — DX: Encounter for therapeutic drug level monitoring: Z51.81

## 2015-09-11 HISTORY — DX: Encounter for antineoplastic chemotherapy: Z51.11

## 2015-09-11 NOTE — Telephone Encounter (Signed)
Per staff message and POF I have scheduled appts. Advised scheduler of appts. JMW  

## 2015-09-11 NOTE — Assessment & Plan Note (Signed)
Her most recent MRI showed no evidence of disease recurrence. Continue to monitor carefully.

## 2015-09-11 NOTE — Progress Notes (Signed)
[facility] OFFICE PROGRESS NOTE  Patient Care Team: [name], MD as PCP - General (Family Medicine) Gaye Pollack, MD (Cardiothoracic Surgery) Thea Silversmith, MD (Radiation Oncology) Dickie La, MD (Family Medicine) Heath Lark, MD as Consulting Physician (Hematology and Oncology) Renella Cunas, MD as Consulting Physician (Cardiology) Greg Cutter, LCSW as [place] [facility] (Licensed Clinical Social Worker) Verlin Grills, RN as [place] (Licensed Clinical Social Worker) Valente David, Therapist, sports as [place] [facility]  SUMMARY OF ONCOLOGIC HISTORY: Oncology History   Colon cancer   Primary site: Colon and Rectum (Left)   Staging method: AJCC 7th Edition   Clinical: Stage I (T2, N0, M0) signed by Heath Lark, MD on 05/31/2013  2:39 PM   Pathologic: Stage I (T2, N0, cM0) signed by Heath Lark, MD on 05/31/2013  2:39 PM   Summary: Stage I (T2, N0, cM0) Lung cancer, EGFR/ALK negative, recurrence after initial resection to LN and brain   Primary site: Lung (Left)   Staging method: AJCC 7th Edition   Clinical: Stage IV (T1, N2, M1b) signed by Heath Lark, MD on 05/31/2013  2:26 PM   Pathologic: Stage IV (T1, N2, M1b) signed by Heath Lark, MD on 05/31/2013  2:26 PM   Summary: Stage IV (T1, N2, M1b)       Cancer of upper lobe of left lung, Adenocarcinoma   03/01/2007 Procedure Colonoscopy revealed abnormalities and biopsy show high-grade dysplasia   04/01/2007 Surgery She underwent sigmoid resection which showed T2 N0 colon cancer, and negative margins and all of 17 lymph nodes were negative   02/29/2008 Procedure Repeat surveillance colonoscopy was negative.   06/16/2010 Surgery She underwent left upper lobectomy we show well-differentiated adenocarcinoma of the lung, T1, N0, M0   03/18/2011 Procedure Repeat colonoscopy show multiple polyps but there were benign   10/19/2011 Procedure Biopsy of  mediastinal lymph node came back positive for recurrence of lung cancer, EGFR and ALK negative   11/16/2011 - 12/14/2011 Chemotherapy She received concurrent chemoradiation therapy with weekly carboplatin and Taxol.   11/16/2011 - 12/29/2011 Radiation Therapy She received radiation therapy with weekly chemotherapy   02/15/2012 Imaging MR of the brain showed a new intracranial metastases. This was subsequently treated with stereotactic radiosurgery.   03/07/2012 - 04/12/2013 Chemotherapy She received chemotherapy with maintainence Alimta every 3 weeks. Chemotherapy was discontinued due to profound fatigue   03/02/2013 Procedure She had therapeutic ultrasound guidance thoracentesis for pleural effusion that came back negative for cancer   05/04/2013 Procedure She had repeat ultrasound-guided thoracentesis again and cytology was negative   05/29/2013 Imaging Repeat CT scan of the chest, abdomen and pelvis show no evidence of disease but persistent right-sided pleural effusion   06/02/2013 Surgery The patient had placement of Pleurx catheter and subsequently underwent pleurodesis.   09/22/2013 Imaging Repeat CT scan show no evidence of active disease. There are nonspecific lymphadenopathy and she is placed on observation.   03/23/2014 Imaging Repeat CT scan of the chest, abdomen and pelvis show recurrence of cancer with widespread bilateral pulmonary metastasis.   05/14/2014 Imaging Imaging of the chest and brain were repeated due to delay of initiation of chemotherapy. Overall, chest CT scan show stable disease. MRI of the head was negative for recurrence   05/16/2014 - 07/18/2014 Chemotherapy  she completed 4 cycles of combination chemotherapy with carboplatin and Alimta   07/16/2014 Imaging  repeat CT scan of the chest, abdomen and pelvis show  regression in the size of lung nodules.   08/29/2014 - 08/14/2015 Chemotherapy She received maintenance Alimta   10/16/2014 Imaging  repeat CT scan of the chest, abdomen and  pelvis show regression in the size of lung nodules.   01/29/2015 Imaging Repeat CT scan showed stable disease   02/01/2015 Imaging MRI brain is negative   04/30/2015 Imaging CT scan of the chest, abdomen and pelvis showed stable disease   07/25/2015 Imaging MRI brain showed no evidence of new disease   09/09/2015 Imaging CT chest showed interval increase in size of large RIGHT upper lobe nodule is most consistent with lung cancer recurrence.    Brain metastases treated with radiosurgery   02/26/2012 Initial Diagnosis Brain metastases treated with radiosurgery    INTERVAL HISTORY: Please see below for problem oriented charting. She returns for further follow-up. She continues intermittent coughing and smoking. Denies recent diagnosis of pneumonia. No hemoptysis. She continues to complain of fatigue Denies recent seizures, worsening headache or new neurological deficit.  REVIEW OF SYSTEMS:   Constitutional: Denies fevers, chills or abnormal weight loss Eyes: Denies blurriness of vision Ears, nose, mouth, throat, and face: Denies mucositis or sore throat Cardiovascular: Denies palpitation, chest discomfort or lower extremity swelling Gastrointestinal:  Denies nausea, heartburn or change in bowel habits Skin: Denies abnormal skin rashes Lymphatics: Denies new lymphadenopathy or easy bruising Neurological:Denies numbness, tingling or new weaknesses Behavioral/Psych: Mood is stable, no new changes  All other systems were reviewed with the patient and are negative.  I have reviewed the past medical history, past surgical history, social history and family history with the patient and they are unchanged from previous note.  ALLERGIES:  is allergic to adhesive and lisinopril.  MEDICATIONS:  Current Outpatient Prescriptions  Medication Sig Dispense Refill  . albuterol (PROVENTIL HFA;VENTOLIN HFA) 108 (90 BASE) MCG/ACT inhaler Inhale 1 puff into the lungs 2 (two) times daily. (Patient taking  differently: Inhale 2 puffs into the lungs every 6 (six) hours as needed. ) 6.7 g prn  . aspirin 81 MG chewable tablet Chew 81 mg by mouth daily.    . clonazePAM (KLONOPIN) 0.5 MG tablet Take 0.5 mg by mouth 2 (two) times daily as needed. Reported on 08/28/2015    . diclofenac sodium (VOLTAREN) 1 % GEL Apply 2 g topically 4 (four) times daily. As needed for pain 100 g 11  . FLUoxetine (PROZAC) 20 MG tablet Take 1 tablet by mouth daily. Reported on 5/63/1497    . folic acid (FOLVITE) 1 MG tablet Take 1 tablet (1 mg total) by mouth daily. 90 tablet 3  . furosemide (LASIX) 20 MG tablet Take 1 tablet (20 mg total) by mouth daily. 90 tablet 3  . lidocaine-prilocaine (EMLA) cream Apply topically as needed. Apply to porta cath site one hour prior to needle stick. 30 g 6  . nebivolol (BYSTOLIC) 10 MG tablet Take 1 tablet (10 mg total) by mouth daily. 90 tablet 4  . omeprazole (PRILOSEC) 40 MG capsule Take 1 capsule (40 mg total) by mouth daily. 90 capsule 3  . potassium chloride SA (K-DUR,KLOR-CON) 20 MEQ tablet TAKE 1 TABLET TWICE DAILY 180 tablet 0  . pravastatin (PRAVACHOL) 40 MG tablet TAKE 1 TABLET BY MOUTH ONCE DAILY 90 tablet 3  . traMADol (ULTRAM) 50 MG tablet Take 1 tablet (50 mg total) by mouth every 6 (six) hours as needed for moderate pain or severe pain. 90 tablet 1  . umeclidinium-vilanterol (ANORO ELLIPTA) 62.5-25 MCG/INH AEPB Inhale 1 puff into  the lungs daily.    . Vitamin D, Ergocalciferol, (DRISDOL) 50000 units CAPS capsule Take 1 capsule (50,000 Units total) by mouth every 7 (seven) days. For 12 weeks, then start taking Vitamin D 1,000 unit tablet daily. 12 capsule 0  . nitroGLYCERIN (NITROSTAT) 0.4 MG SL tablet Place 1 tablet (0.4 mg total) under the tongue every 5 (five) minutes as needed. For chest pain (Patient not taking: Reported on 09/11/2015) 25 tablet 5  . tiZANidine (ZANAFLEX) 2 MG tablet Take 1 tablet (2 mg total) by mouth every 6 (six) hours as needed (Back Muscle Spasm).  (Patient not taking: Reported on 09/11/2015) 30 tablet 3  . [DISCONTINUED] buPROPion (WELLBUTRIN XL) 150 MG 24 hr tablet Take 1 tablet (150 mg total) by mouth 2 (two) times daily. 60 tablet 5   No current facility-administered medications for this visit.    PHYSICAL EXAMINATION: ECOG PERFORMANCE STATUS: 1 - Symptomatic but completely ambulatory  Filed Vitals:   09/11/15 1007  BP: 141/82  Pulse: 77  Temp: 97.7 F (36.5 C)  Resp: 18   Filed Weights   09/11/15 1007  Weight: 167 lb 12.8 oz (76.114 kg)    GENERAL:alert, no distress and comfortable SKIN: skin color, texture, turgor are normal, no rashes or significant lesions EYES: normal, Conjunctiva are pink and non-injected, sclera clear Musculoskeletal:no cyanosis of digits and no clubbing  NEURO: alert & oriented x 3 with fluent speech, no focal motor/sensory deficits  LABORATORY DATA:  I have reviewed the data as listed    Component Value Date/Time   NA 143 08/14/2015 0941   NA 141 12/26/2013 0946   NA 143 09/28/2011 0833   K 3.6 08/14/2015 0941   K 4.0 12/26/2013 0946   K 3.5 09/28/2011 0833   CL 106 12/26/2013 0946   CL 102 10/24/2012 1001   CL 98 09/28/2011 0833   CO2 26 08/14/2015 0941   CO2 27 12/26/2013 0946   CO2 30 09/28/2011 0833   GLUCOSE 100 08/14/2015 0941   GLUCOSE 101* 12/26/2013 0946   GLUCOSE 111* 10/24/2012 1001   GLUCOSE 112 09/28/2011 0833   BUN 9.9 08/14/2015 0941   BUN 9 12/26/2013 0946   BUN 9 09/28/2011 0833   CREATININE 1.2* 08/14/2015 0941   CREATININE 0.83 12/26/2013 0946   CREATININE 0.7 09/28/2011 0833   CALCIUM 8.9 08/14/2015 0941   CALCIUM 8.8 12/26/2013 0946   CALCIUM 8.7 09/28/2011 0833   PROT 6.9 08/14/2015 0941   PROT 6.8 12/26/2013 0946   PROT 7.2 09/28/2011 0833   ALBUMIN 2.8* 08/14/2015 0941   ALBUMIN 3.8 12/26/2013 0946   ALBUMIN 3.2* 09/28/2011 0833   AST 13 08/14/2015 0941   AST 12 12/26/2013 0946   AST 15 09/28/2011 0833   ALT <9 08/14/2015 0941   ALT <8  12/26/2013 0946   ALT 16 09/28/2011 0833   ALKPHOS 110 08/14/2015 0941   ALKPHOS 148* 12/26/2013 0946   ALKPHOS 99* 09/28/2011 0833   BILITOT 0.30 08/14/2015 0941   BILITOT 0.6 12/26/2013 0946   BILITOT 0.80 09/28/2011 0833   GFRNONAA 74* 06/12/2013 0625   GFRAA 86* 06/12/2013 0625    No results found for: SPEP, UPEP  Lab Results  Component Value Date   WBC 6.2 08/14/2015   NEUTROABS 4.7 08/14/2015   HGB 11.2* 08/14/2015   HCT 36.1 08/14/2015   MCV 84.3 08/14/2015   PLT 222 08/14/2015      Chemistry      Component Value Date/Time   NA 143  08/14/2015 0941   NA 141 12/26/2013 0946   NA 143 09/28/2011 0833   K 3.6 08/14/2015 0941   K 4.0 12/26/2013 0946   K 3.5 09/28/2011 0833   CL 106 12/26/2013 0946   CL 102 10/24/2012 1001   CL 98 09/28/2011 0833   CO2 26 08/14/2015 0941   CO2 27 12/26/2013 0946   CO2 30 09/28/2011 0833   BUN 9.9 08/14/2015 0941   BUN 9 12/26/2013 0946   BUN 9 09/28/2011 0833   CREATININE 1.2* 08/14/2015 0941   CREATININE 0.83 12/26/2013 0946   CREATININE 0.7 09/28/2011 0833      Component Value Date/Time   CALCIUM 8.9 08/14/2015 0941   CALCIUM 8.8 12/26/2013 0946   CALCIUM 8.7 09/28/2011 0833   ALKPHOS 110 08/14/2015 0941   ALKPHOS 148* 12/26/2013 0946   ALKPHOS 99* 09/28/2011 0833   AST 13 08/14/2015 0941   AST 12 12/26/2013 0946   AST 15 09/28/2011 0833   ALT <9 08/14/2015 0941   ALT <8 12/26/2013 0946   ALT 16 09/28/2011 0833   BILITOT 0.30 08/14/2015 0941   BILITOT 0.6 12/26/2013 0946   BILITOT 0.80 09/28/2011 0833       RADIOGRAPHIC STUDIES: I have personally reviewed the radiological images as listed and agreed with the findings in the report. Ct Chest W Contrast  09/10/2015  CLINICAL DATA:  Metastatic lung cancer to the brain and both lungs diagnosed January 2012 EXAM: CT CHEST WITH CONTRAST TECHNIQUE: Multidetector CT imaging of the chest was performed during intravenous contrast administration. CONTRAST:  46m ISOVUE-300  IOPAMIDOL (ISOVUE-300) INJECTION 61% COMPARISON:  04/30/2015 FINDINGS: Mediastinum/Nodes: No axillary or supraclavicular lymphadenopathy. Is ill-defined tissue planes from the lower lobe bronchus consists radiation change. Lungs/Pleura: Perihilar thickening on the RIGHT with linear fibrotic change. Within the posterior RIGHT upper lobe there is a nodular density measuring 2.8 x 1.8 cm increased from 1.4 by 1.0 cm. Nodule previously difficult to identify adjacent to the pleural parenchymal radiation change. Within the RIGHT lower lobe multiple small peripheral sub cm pulmonary nodules are unchanged. A similar peripheral nodules in the LEFT upper lobe Upper abdomen: Small hypodense lesion the posterior RIGHT hepatic lobe (image 86, series 2 is unchanged. Adrenal glands are normal. Small precordial lymph nodes are again noted (image 61, series 2 Musculoskeletal: No aggressive osseous lesion. IMPRESSION: 1. Interval increase in size of large RIGHT upper lobe nodule is most consistent with lung cancer recurrence. 2. Stable perihilar postradiation change the RIGHT. 3. Multiple small peripheral sub 5 mm nodules are unchanged. Electronically Signed   By: SSuzy BouchardM.D.   On: 09/10/2015 14:09     ASSESSMENT & PLAN:  Cancer of upper lobe of left lung, Adenocarcinoma I review current guidelines with the patient. She has clear disease progression. I would discontinue her current chemotherapy. Per guidelines, she would be a candidate for PD1 inhibitors. After much discussion, she is in agreement to try Nivolumab. The risks, benefits, side effects of treatment were fully discussed with the patient and she agreed to proceed. She understand that goals of treatment is palliative. Per guidelines, I will repeat next generation sequencing with Guardant 360. Prior evaluation on tissue sampling in 2013 showed she was EGFR/ALK mutation negative  Brain metastases treated with radiosurgery Her most recent MRI showed no  evidence of disease recurrence. Continue to monitor carefully.  TOBACCO ABUSE I spent some time counseling the patient the importance of tobacco cessation. She is currently not interested to quit now.  Orders Placed This Encounter  Procedures  . CBC with Differential [facility])    Standing Status: Standing     Number of Occurrences: 20     Standing Expiration Date: 09/11/2016  . Comprehensive metabolic panel    Standing Status: Standing     Number of Occurrences: 20     Standing Expiration Date: 09/11/2016  . TSH    Standing Status: Standing     Number of Occurrences: 22     Standing Expiration Date: 09/10/2016  . GUARDANT 360    Standing Status: Future     Number of Occurrences:      Standing Expiration Date: 09/10/2016   All questions were answered. The patient knows to call the clinic with any problems, questions or concerns. No barriers to learning was detected. I spent 30 minutes counseling the patient face to face. The total time spent in the appointment was 40 minutes and more than 50% was on counseling and review of test results     [facility], [place], MD 09/11/2015 10:55 AM

## 2015-09-11 NOTE — Assessment & Plan Note (Signed)
I review current guidelines with the patient. She has clear disease progression. I would discontinue her current chemotherapy. Per guidelines, she would be a candidate for PD1 inhibitors. After much discussion, she is in agreement to try Nivolumab. The risks, benefits, side effects of treatment were fully discussed with the patient and she agreed to proceed. She understand that goals of treatment is palliative. Per guidelines, I will repeat next generation sequencing with Guardant 360. Prior evaluation on tissue sampling in 2013 showed she was EGFR/ALK mutation negative

## 2015-09-11 NOTE — Assessment & Plan Note (Signed)
I spent some time counseling the patient the importance of tobacco cessation. She is currently not interested to quit now. 

## 2015-09-12 ENCOUNTER — Ambulatory Visit: Payer: Self-pay | Admitting: Family Medicine

## 2015-09-12 MED ORDER — DICLOFENAC SODIUM 1 % TD GEL: 2.0000 g | Freq: Four times a day (QID) | TRANSDERMAL | Status: DC

## 2015-09-12 NOTE — Telephone Encounter (Signed)
Rx written and given to [facility]   Thanks  [place]

## 2015-09-12 NOTE — Telephone Encounter (Signed)
Faxed

## 2015-09-13 ENCOUNTER — Other Ambulatory Visit: Payer: Self-pay | Admitting: Licensed Clinical Social Worker

## 2015-09-13 ENCOUNTER — Ambulatory Visit: Payer: Self-pay

## 2015-09-13 NOTE — Patient Outreach (Signed)
[facility]) Care Management  09/13/2015  Lauren [name] 07-21-1950 [id number]   Assessment-CSW has not received return call from [facility] with [employer]. CSW completed outreach to [place] and left a HIPPA compliant voice message encouraging return call. CSW contacted patient and provided updates. CSW informed her that there was a concern for building the ramp due to having public housing. Patient shares that she received call from [facility] and has assessment appointment on 09/17/15.   Plan-CSW will await call from [facility]. CSW will follow up within one week.  [name], BSW, MSW, [email or web address] Phone: [phone] Fax: [phone]

## 2015-09-14 ENCOUNTER — Other Ambulatory Visit: Payer: Self-pay | Admitting: Obstetrics and Gynecology

## 2015-09-16 ENCOUNTER — Other Ambulatory Visit: Payer: Self-pay | Admitting: Licensed Clinical Social Worker

## 2015-09-16 ENCOUNTER — Other Ambulatory Visit: Payer: Self-pay | Admitting: Obstetrics and Gynecology

## 2015-09-16 NOTE — Patient Outreach (Signed)
Lauren [name]) Care Management  09/16/2015  Lauren [name] 06/27/1950 [id number]   Assessment- CSW received incoming call from [facility] with [employer] and they confirmed that they will NOT be able to provide ramp on patient's home due to her not owning home and also because it is public housing.  Plan-CSW will update patient and RNCM.  [name], BSW, MSW, [email or web address] Phone: [phone] Fax: [phone]

## 2015-09-17 ENCOUNTER — Other Ambulatory Visit: Payer: Self-pay | Admitting: Licensed Clinical Social Worker

## 2015-09-17 ENCOUNTER — Ambulatory Visit: Payer: Commercial Managed Care - HMO | Attending: Family Medicine

## 2015-09-17 DIAGNOSIS — R2689 Other abnormalities of gait and mobility: Secondary | ICD-10-CM | POA: Insufficient documentation

## 2015-09-17 DIAGNOSIS — R42 Dizziness and giddiness: Secondary | ICD-10-CM | POA: Diagnosis not present

## 2015-09-17 DIAGNOSIS — H8113 Benign paroxysmal vertigo, bilateral: Secondary | ICD-10-CM | POA: Insufficient documentation

## 2015-09-17 NOTE — Patient Outreach (Signed)
[facility]) Care Management  09/17/2015  Lauren [name] June 19, 1950 [id number]   Assessment-Assessment-CSW completed outreach attempt today. CSW unable to reach patient successfully. CSW left a HIPPA compliant voice message encouraging patient to return call once available.  Plan-CSW will await return call or complete an additional outreach if needed.  [name], BSW, MSW, [email or web address] Phone: [phone] Fax: [phone]

## 2015-09-18 ENCOUNTER — Ambulatory Visit: Payer: Commercial Managed Care - HMO

## 2015-09-18 ENCOUNTER — Ambulatory Visit (HOSPITAL_BASED_OUTPATIENT_CLINIC_OR_DEPARTMENT_OTHER): Payer: Commercial Managed Care - HMO

## 2015-09-18 ENCOUNTER — Ambulatory Visit: Payer: Self-pay

## 2015-09-18 ENCOUNTER — Other Ambulatory Visit (HOSPITAL_BASED_OUTPATIENT_CLINIC_OR_DEPARTMENT_OTHER): Payer: Commercial Managed Care - HMO

## 2015-09-18 ENCOUNTER — Ambulatory Visit: Payer: Self-pay | Admitting: Hematology and Oncology

## 2015-09-18 VITALS — BP 153/89 | HR 76 | Temp 98.3°F | Resp 18

## 2015-09-18 DIAGNOSIS — Z79899 Other long term (current) drug therapy: Secondary | ICD-10-CM | POA: Diagnosis not present

## 2015-09-18 DIAGNOSIS — Z5112 Encounter for antineoplastic immunotherapy: Secondary | ICD-10-CM | POA: Diagnosis not present

## 2015-09-18 DIAGNOSIS — C3412 Malignant neoplasm of upper lobe, left bronchus or lung: Secondary | ICD-10-CM | POA: Diagnosis not present

## 2015-09-18 DIAGNOSIS — Z5181 Encounter for therapeutic drug level monitoring: Secondary | ICD-10-CM

## 2015-09-18 DIAGNOSIS — C7931 Secondary malignant neoplasm of brain: Secondary | ICD-10-CM

## 2015-09-18 DIAGNOSIS — Z5111 Encounter for antineoplastic chemotherapy: Secondary | ICD-10-CM

## 2015-09-18 DIAGNOSIS — C7949 Secondary malignant neoplasm of other parts of nervous system: Secondary | ICD-10-CM

## 2015-09-18 LAB — CBC WITH DIFFERENTIAL/PLATELET
BASO%: 0.2 % (ref 0.0–2.0)
BASOS ABS: 0 10*3/uL (ref 0.0–0.1)
EOS ABS: 0.1 10*3/uL (ref 0.0–0.5)
EOS%: 0.9 % (ref 0.0–7.0)
HEMATOCRIT: 37.3 % (ref 34.8–46.6)
HEMOGLOBIN: 11.4 g/dL — AB (ref 11.6–15.9)
LYMPH%: 15 % (ref 14.0–49.7)
MCH: 25.9 pg (ref 25.1–34.0)
MCHC: 30.6 g/dL — ABNORMAL LOW (ref 31.5–36.0)
MCV: 84.8 fL (ref 79.5–101.0)
MONO#: 0.4 10*3/uL (ref 0.1–0.9)
MONO%: 6.9 % (ref 0.0–14.0)
NEUT#: 4.4 10*3/uL (ref 1.5–6.5)
NEUT%: 77 % — ABNORMAL HIGH (ref 38.4–76.8)
Platelets: 182 10*3/uL (ref 145–400)
RBC: 4.4 10*6/uL (ref 3.70–5.45)
RDW: 17.1 % — AB (ref 11.2–14.5)
WBC: 5.7 10*3/uL (ref 3.9–10.3)
lymph#: 0.9 10*3/uL (ref 0.9–3.3)

## 2015-09-18 LAB — TSH: TSH: 2.053 m[IU]/L (ref 0.308–3.960)

## 2015-09-18 LAB — COMPREHENSIVE METABOLIC PANEL
ALK PHOS: 121 U/L (ref 40–150)
ALT: <9 U/L (ref 0–55)
AST: 13 U/L (ref 5–34)
Albumin: 2.9 g/dL — ABNORMAL LOW (ref 3.5–5.0)
Anion Gap: 8 mEq/L (ref 3–11)
BUN: 11 mg/dL (ref 7.0–26.0)
CALCIUM: 9 mg/dL (ref 8.4–10.4)
CO2: 27 mEq/L (ref 22–29)
Chloride: 107 mEq/L (ref 98–109)
Creatinine: 1.2 mg/dL — ABNORMAL HIGH (ref 0.6–1.1)
EGFR: 55 mL/min/{1.73_m2} — AB (ref >90)
Glucose: 93 mg/dl (ref 70–140)
POTASSIUM: 3.8 meq/L (ref 3.5–5.1)
SODIUM: 142 meq/L (ref 136–145)
Total Bilirubin: 0.36 mg/dL (ref 0.20–1.20)
Total Protein: 6.7 g/dL (ref 6.4–8.3)

## 2015-09-18 MED ORDER — SODIUM CHLORIDE 0.9 % IV SOLN
240.0000 mg | Freq: Once | INTRAVENOUS | Status: AC
  Administered 2015-09-18: 240 mg via INTRAVENOUS
  Filled 2015-09-18: qty 20

## 2015-09-18 MED ORDER — SODIUM CHLORIDE 0.9 % IJ SOLN
10.0000 mL | INTRAMUSCULAR | Status: AC | PRN
  Administered 2015-09-18 (×2): 10 mL via INTRAVENOUS
  Filled 2015-09-18: qty 10

## 2015-09-18 MED ORDER — SODIUM CHLORIDE 0.9% FLUSH
10.0000 mL | INTRAVENOUS | Status: DC | PRN
  Filled 2015-09-18: qty 10

## 2015-09-18 MED ORDER — HEPARIN SOD (PORK) LOCK FLUSH 100 UNIT/ML IV SOLN
500.0000 [IU] | Freq: Once | INTRAVENOUS | Status: AC | PRN
  Administered 2015-09-18: 500 [IU]
  Filled 2015-09-18: qty 5

## 2015-09-18 MED ORDER — SODIUM CHLORIDE 0.9 % IV SOLN
Freq: Once | INTRAVENOUS | Status: AC
  Administered 2015-09-18: 13:00:00 via INTRAVENOUS

## 2015-09-18 NOTE — Patient Instructions (Signed)

## 2015-09-18 NOTE — Patient Instructions (Addendum)
[facility] Discharge Instructions for Patients Receiving Chemotherapy  Today you received the following chemotherapy agents Nivolumab  To help prevent nausea and vomiting after your treatment, we encourage you to take your nausea medication   If you develop nausea and vomiting that is not controlled by your nausea medication, call the clinic.   BELOW ARE SYMPTOMS THAT SHOULD BE REPORTED IMMEDIATELY:  *FEVER GREATER THAN 100.5 F  *CHILLS WITH OR WITHOUT FEVER  NAUSEA AND VOMITING THAT IS NOT CONTROLLED WITH YOUR NAUSEA MEDICATION  *UNUSUAL SHORTNESS OF BREATH  *UNUSUAL BRUISING OR BLEEDING  TENDERNESS IN MOUTH AND THROAT WITH OR WITHOUT PRESENCE OF ULCERS  *URINARY PROBLEMS  *BOWEL PROBLEMS  UNUSUAL RASH Items with * indicate a potential emergency and should be followed up as soon as possible.  Feel free to call the clinic you have any questions or concerns. The clinic phone number is [phone].  Please show the [place] at check-in to the Emergency Department and triage nurse.  Nivolumab injection What is this medicine? NIVOLUMAB (nye VOL ue mab) is a monoclonal antibody. It is used to treat melanoma, lung cancer, kidney cancer, and Hodgkin lymphoma. This medicine may be used for other purposes; ask your health care provider or pharmacist if you have questions. What should I tell my health care provider before I take this medicine? They need to know if you have any of these conditions: -diabetes -immune system problems -kidney disease -liver disease -lung disease -organ transplant -stomach or intestine problems -thyroid disease -an unusual or allergic reaction to nivolumab, other medicines, foods, dyes, or preservatives -pregnant or trying to get pregnant -breast-feeding How should I use this medicine? This medicine is for infusion into a vein. It is given by a health care professional in a hospital or clinic setting. A special MedGuide  will be given to you before each treatment. Be sure to read this information carefully each time. Talk to your pediatrician regarding the use of this medicine in children. Special care may be needed. Overdosage: If you think you have taken too much of this medicine contact a poison control center or emergency room at once. NOTE: This medicine is only for you. Do not share this medicine with others. What if I miss a dose? It is important not to miss your dose. Call your doctor or health care professional if you are unable to keep an appointment. What may interact with this medicine? Interactions have not been studied. Give your health care provider a list of all the medicines, herbs, non-prescription drugs, or dietary supplements you use. Also tell them if you smoke, drink alcohol, or use illegal drugs. Some items may interact with your medicine. This list may not describe all possible interactions. Give your health care provider a list of all the medicines, herbs, non-prescription drugs, or dietary supplements you use. Also tell them if you smoke, drink alcohol, or use illegal drugs. Some items may interact with your medicine. What should I watch for while using this medicine? This drug may make you feel generally unwell. Continue your course of treatment even though you feel ill unless your doctor tells you to stop. You may need blood work done while you are taking this medicine. Do not become pregnant while taking this medicine or for 5 months after stopping it. Women should inform their doctor if they wish to become pregnant or think they might be pregnant. There is a potential for serious side effects to an unborn child. Talk  to your health care professional or pharmacist for more information. Do not breast-feed an infant while taking this medicine. What side effects may I notice from receiving this medicine? Side effects that you should report to your doctor or health care professional as soon as  possible: -allergic reactions like skin rash, itching or hives, swelling of the face, lips, or tongue -black, tarry stools -blood in the urine -bloody or watery diarrhea -changes in vision -change in sex drive -changes in emotions or moods -chest pain -confusion -cough -decreased appetite -diarrhea -facial flushing -feeling faint or lightheaded -fever, chills -hair loss -hallucination, loss of contact with reality -headache -irritable -joint pain -loss of memory -muscle pain -muscle weakness -seizures -shortness of breath -signs and symptoms of high blood sugar such as dizziness; dry mouth; dry skin; fruity breath; nausea; stomach pain; increased hunger or thirst; increased urination -signs and symptoms of kidney injury like trouble passing urine or change in the amount of urine -signs and symptoms of liver injury like dark yellow or brown urine; general ill feeling or flu-like symptoms; light-colored stools; loss of appetite; nausea; right upper belly pain; unusually weak or tired; yellowing of the eyes or skin -stiff neck -swelling of the ankles, feet, hands -weight gain Side effects that usually do not require medical attention (report to your doctor or health care professional if they continue or are bothersome): -bone pain -constipation -tiredness -vomiting This list may not describe all possible side effects. Call your doctor for medical advice about side effects. You may report side effects to FDA at 1-800-FDA-1088. Where should I keep my medicine? This drug is given in a hospital or clinic and will not be stored at home. NOTE: This sheet is a summary. It may not cover all possible information. If you have questions about this medicine, talk to your doctor, pharmacist, or health care provider.    2016, Elsevier/Gold Standard. (2014-10-03 10:03:42)

## 2015-09-18 NOTE — Therapy (Signed)
[facility] [address] [place], [place], [address] Phone: [phone]   Fax:  [phone]  Physical Therapy Treatment  Patient Details  Name: Lauren [name] MRN: [id number] Date of Birth: Jan 05, 1951 Referring Provider: Dr. Wendy Poet  Encounter Date: 09/17/2015      PT End of Session - 09/18/15 1126    Visit Number 6   Number of Visits 9   Date for PT Re-Evaluation 09/15/15  PT requesting add'l 2x/week for 4 weeks   Authorization Type G-code and progress note every 10th visit. No auth require per Lattie Haw   PT Start Time 1102   PT Stop Time 1147   PT Time Calculation (min) 45 min   Equipment Utilized During Treatment Gait belt   Activity Tolerance Patient limited by fatigue   Behavior During Therapy [facility] for tasks assessed/performed      Past Medical History  Diagnosis Date  . Depression   . Hyperlipidemia   . Hypertension   . Lung nodule     FNA ordered for 04/02/10 by HA>pos Ca  . Chronic folliculitis     of groin  . Fibrocystic breast changes   . Family history of trichomonal vaginitis 05/2005  . Postmenopausal   . Depressive disorder   . Hypercholesterolemia   . GERD (gastroesophageal reflux disease)   . Cerebral aneurysm   . Back pain   . Shortness of breath   . COPD (chronic obstructive pulmonary disease) ([place])   . Coronary artery disease   . Arthritis   . Weight loss 10/22/2011  . Status post radiation therapy 11/16/11 - 12/29/11    Right Lung and Mediastinum: 60 Gy  . Status post chemotherapy comp. 12/29/11    Carboplatin/Taxol  . S/P radiation therapy 03/01/12    SRS: 1 fraction / 20 Gray each to the Left Occipital Region and to the Right Insular Metastases  . On antineoplastic chemotherapy started 02/2012    [place]  . Fatigue 02/06/2013  . Pleural effusion 05/03/2013  . Blurred vision, bilateral 06/06/2014  . Colon cancer ([place]) 11/08  . Lung cancer ([place]) 06/16/10    PET scan 04/28/2010;  primary: increase in size 02/2010 / Well Differentiated Adenocarcinoma of the lung   . Brain metastases ([place]) 02/15/12  . Other fatigue 11/28/2014  . Bilateral pleural effusion 08/09/2013  . POSTMENOPAUSAL SYNDROME 01/31/2009    Qualifier: Diagnosis of  By: Carlena Sax  MD, Colletta [place]    . Hypokalemia 08/09/2013  . 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 Bilateral leg edema 10/17/2014  . 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 Pleural effusion, malignant 05/2013    Recurrent Pleural Effusion  . Status post stereotactic radiosurgery 01/2012    for Brain Metastases  . Tobacco dependence   . Family history of Huntington's disease   . Acute on chronic respiratory failure with hypoxemia ([place]) 07/11/2015  . Falls frequently 08/02/2015  . Benign paroxysmal positional vertigo 08/16/2015  . Vitamin D deficiency 08/30/2015  . Encounter for therapeutic drug monitoring 09/11/2015  . Encounter for antineoplastic chemotherapy 09/11/2015    Past Surgical History  Procedure Laterality Date  . Colectomy  03/22/07    Stage 1 pT2 N0, M0 Adenocarcinoma of the sigmoid  colon  . Tubal ligation    . Lung lobectomy  06/16/10    Left Upper Lobectomy  . Hernia repair    . Breast surgery      Bil lumpectomy  . Back surgery      Dr Luiz Ochoa  .  Mediastinoscopy  10/19/2011    Procedure: MEDIASTINOSCOPY;  Surgeon: Gaye Pollack, MD;  Location: [facility] OR;  Service: Thoracic;  Laterality: N/A;  . Cardiac cath x3    . Chest tube insertion Right 06/12/2013    Procedure: INSERTION PLEURAL DRAINAGE CATHETER;  Surgeon: Gaye Pollack, MD;  Location: [facility];  Service: Thoracic;  Laterality: Right;  . [place] pleurodesis Right 06/12/2013    Procedure: Pietro Cassis;  Surgeon: Gaye Pollack, MD;  Location: [facility];  Service: Thoracic;  Laterality: Right;  . Tunneled venous catheter placement      Port-a-Cath    There were no vitals filed for this visit.      Subjective Assessment - 09/17/15 1106    Subjective Pt reported CT scan revealed  another lung CA nodule, so she now has to go to treatment every 2 weeks and change medications. Pt denied falls since last visit. Pt reported cramping in LLE when walking and when stretching in bed.   Patient is accompained by: Family member   Pertinent History Active lung CA and pt currently smokes, hx of colon and lung CA with brain mets, LVH, cerebral aneurysm, COPD, neuropathy from chemo, depressive disorder, LBP with sciatica, arthritis, HTN   Patient Stated Goals To be normal again, and IND. "I want to be in control again". Stop dizziness.   Currently in Pain? No/denies                         [facility] Adult PT Treatment/Exercise - 09/17/15 1121    Ambulation/Gait   Ambulation/Gait Yes   Ambulation/Gait Assistance 5: Supervision;4: Min guard   Ambulation/Gait Assistance Details Min guard with SPC and S with rollator.    Ambulation Distance (Feet) 75 Feet  100' with SPC, 67' with rollator   Assistive device Rollator;Straight cane   Gait Pattern Decreased stride length;Decreased dorsiflexion - left;Step-through pattern;Shuffle;Narrow base of support   Ambulation Surface Level;Indoor   Gait velocity 1.63f/sec. with SPC and pt fatigued and could not finish 10MWT today with rollator.   Standardized Balance Assessment   Standardized Balance Assessment Berg Balance Test   Berg Balance Test   Sit to Stand Able to stand without using hands and stabilize independently   Standing Unsupported Able to stand safely 2 minutes   Sitting with Back Unsupported but Feet Supported on Floor or Stool Able to sit safely and securely 2 minutes   Stand to Sit Sits safely with minimal use of hands   Transfers Able to transfer safely, minor use of hands   Standing Unsupported with Eyes Closed Able to stand 10 seconds safely   Standing Ubsupported with Feet Together Able to place feet together independently and stand 1 minute safely   From Standing, Reach Forward with Outstretched Arm Can reach  forward >12 cm safely (5")  8"   From Standing Position, Pick up Object from Floor Able to pick up shoe safely and easily   From Standing Position, Turn to Look Behind Over each Shoulder Looks behind from both sides and weight shifts well   Turn 360 Degrees Able to turn 360 degrees safely but slowly   Standing Unsupported, Alternately Place Feet on Step/Stool Able to complete 4 steps without aid or supervision   Standing Unsupported, One Foot in [place] place foot tandem independently and hold 30 seconds   Standing on One Leg Able to lift leg independently and hold 5-10 seconds  7sec.   Total Score  50          Self Care:     PT Education - 09/18/15 1125    Education provided Yes   Education Details PT discussed goal progress and renewal. PT agreeable but stated she might need to hold PT 2/2 new CA treatment. PT also ensured pt that gait training would occur once she obtains rollator and that PT would f/u with MD regarding rollator Rx.  PT discussed allowing PT to test/treat vertigo next session, as sx's have decr. and pt should be able to tolerate treatment now.    Person(s) Educated Patient;Other (comment)  sister   Methods Explanation   Comprehension Verbalized understanding          PT Short Term Goals - 08/16/15 1634    PT SHORT TERM GOAL #1   Title same as LTGs.            PT Long Term Goals - 09/18/15 1130    PT LONG TERM GOAL #1   Title Pt will be IND in HEP to improve balance and decr. dizziness to improve functional mobility. Target date: 09/13/15   Status On-going   PT LONG TERM GOAL #2   Title Pt will improve gait speed with LRAD to >/=1.19f/sec. to decrease falls risk.Target date: 09/13/15   Status On-going   PT LONG TERM GOAL #3   Title Perform BERG and write goal if appropriate. Pt will improve BERG score to >/=45/56 to decr. risk of falls. Target date: 09/13/15   Status Achieved   PT LONG TERM GOAL #4   Title Pt will report </=2/10 dizziness when  turning to R side while in supine position to improve safety during functional mobility. Target date: 09/13/15   Status Achieved   PT LONG TERM GOAL #5   Title Pt will amb. 500' with LRAD over even and paved surfaces at MOD I level to improve functional moiblity. Target date: 09/13/15   Status On-going   PT LONG TERM GOAL #6   Title Pt will improve ABC score from 1.9% to 16% to improve quality of life and confidence during gait. Target date: 09/13/15   Status On-going               Plan - 09/18/15 1128    Clinical Impression Statement Pt demonstrated progress as she met LTG 3 and 4. Pt limited by fatigue, and required frequent seated rest breaks during session. PT requesting renewal for 2x/week for 4 weeks in order to maximize functional gains, pt's ability to make freqency might be impacted by new CA treatment. Continue with POC.  PT will revise BERG goal next session. All unmet goals will be carried to new POC: 10/15/15.   Rehab Potential Fair   Clinical Impairments Affecting Rehab Potential co-morbidities   PT Frequency 2x / week   PT Duration 4 weeks   PT Treatment/Interventions ADLs/Self Care Home Management;Biofeedback;Canalith Repostioning;Balance training;Manual techniques;Therapeutic exercise;Vestibular;Therapeutic activities;Functional mobility training;Stair training;Gait training;DME Instruction;Patient/family education;Orthotic Fit/Training;Cognitive remediation;Neuromuscular re-education   PT Next Visit Plan Finish assessing goals treat BPPV tol tolerance.  Revise BERG goal.   PT Home Exercise Plan Habituation and gaze stab. HEP   Consulted and Agree with Plan of Care Patient;Family member/caregiver   Family Member Consulted sister      Patient will benefit from skilled therapeutic intervention in order to improve the following deficits and impairments:  Abnormal gait, Dizziness, Postural dysfunction, Decreased cognition, Decreased balance, Decreased mobility, Decreased  knowledge of use of DME, Decreased strength  Visit Diagnosis: BPPV (benign paroxysmal positional vertigo), bilateral - Plan: PT plan of care cert/re-cert  Dizziness and giddiness - Plan: PT plan of care cert/re-cert  Other abnormalities of gait and mobility - Plan: PT plan of care cert/re-cert     Problem List Patient Active Problem List   Diagnosis Date Noted  . Encounter for chemotherapy management 09/11/2015  . Encounter for therapeutic drug monitoring 09/11/2015  . Encounter for antineoplastic chemotherapy 09/11/2015  . Protein-calorie malnutrition, moderate ([place]) 09/11/2015  . Abnormality of gait 09/10/2015  . Vitamin D deficiency 08/30/2015  . Mild cognitive impairment 08/16/2015  . Benign paroxysmal positional vertigo 08/16/2015  . Falls frequently 08/02/2015  . COPD mixed type ([place]) 07/15/2015  . Dyspnea 07/15/2015  . Acute on chronic respiratory failure with hypoxemia ([place]) 07/11/2015  . Postural dizziness 06/19/2015  . Protein calorie malnutrition ([place]) 03/14/2015  . Family history of Huntington's disease   . LVH (left ventricular hypertrophy) 12/20/2014  . Other fatigue 11/28/2014  . Blepharitis of both eyes 11/09/2014  . Bilateral leg edema 10/17/2014  . Anemia due to chemotherapy 08/29/2014  . Neuropathy due to chemotherapeutic drug ([place]) 07/19/2014  . Left-sided low back pain with left-sided sciatica 07/18/2014  . Low back pain with sciatica 06/22/2014  . Colon cancer ([place]) 05/31/2013  . Fatigue 02/06/2013  . ASCUS with positive high risk HPV 04/21/2012  . Brain metastases treated with radiosurgery 02/26/2012  . Health care maintenance 01/20/2012  . Insomnia 10/05/2011  . Cancer of upper lobe of left lung, Adenocarcinoma   . TOBACCO ABUSE 10/04/2007  . Coronary atherosclerosis 09/09/2007  . HYPERCHOLESTEROLEMIA 12/29/2006  . DEPRESSIVE DISORDER, MAJOR, RCR, MILD 12/29/2006  . Essential hypertension 12/29/2006  . CEREBRAL ANEURYSM 12/29/2006  . GERD  12/29/2006    Lindsey Hommel L 09/18/2015, 11:33 AM  [facility] [address] [place], [place], [address] Phone: [phone]   Fax:  [phone]  Name: Lauren [name] MRN: [id number] Date of Birth: November 30, 1950    Geoffry Paradise, PT,DPT 09/18/2015 11:33 AM Phone: [phone] Fax: [phone]

## 2015-09-20 ENCOUNTER — Telehealth: Payer: Self-pay

## 2015-09-20 NOTE — Telephone Encounter (Signed)
-----   Message from Azzie Glatter, RN sent at 09/18/2015  1:39 PM EDT ----- Regarding: "1st time chemotherapy, per Dr. Alvy Bimler" Patient receive Nivolumab per Dr. Alvy Bimler.  Patient tolerated treatment well with no complaints.

## 2015-09-20 NOTE — Telephone Encounter (Signed)
Called Lauren [name] for chemotherapy F/U.  Patient is doing well.  Denies n/v.  Denies any new side effects or symptoms.  Bowel and bladder is functioning well.  Eating and drinking well and I instructed to drink 64 oz minimum daily or at least the day before, of and after treatment.  Denies questions at this time and encouraged to call if needed.  Reviewed how to call after hours in the case of an emergency.

## 2015-09-24 ENCOUNTER — Ambulatory Visit: Payer: Commercial Managed Care - HMO

## 2015-09-24 ENCOUNTER — Telehealth: Payer: Self-pay | Admitting: *Deleted

## 2015-09-24 DIAGNOSIS — R2689 Other abnormalities of gait and mobility: Secondary | ICD-10-CM | POA: Diagnosis not present

## 2015-09-24 DIAGNOSIS — H8113 Benign paroxysmal vertigo, bilateral: Secondary | ICD-10-CM | POA: Diagnosis not present

## 2015-09-24 DIAGNOSIS — R42 Dizziness and giddiness: Secondary | ICD-10-CM

## 2015-09-24 NOTE — Patient Instructions (Signed)
Perform in a corner with chair in front of you for safety or at kitchen sink with chair behind you:  Single Leg - Eyes Closed    While standing on left leg, close eyes and maintain balance. Hold__10__ seconds. Switch and stand on right leg for 10 seconds. Repeat _3___ times per session per leg. Do __1__ sessions per day.  Copyright  VHI. All rights reserved.  Feet Together (Compliant Surface) Varied Arm Positions - Eyes Closed    Stand on compliant surface: __pillow/cushion______ with feet together and arms at your side. Close eyes and visualize upright position. Hold_10-30___ seconds. Repeat _3___ times per session. Do __1__ sessions per day.  Copyright  VHI. All rights reserved.  Feet Together (Compliant Surface) Head Motion - Eyes Open    With eyes open, standing on compliant surface: __pillow/cusion______, feet together, move head slowly: up and down 5 times and side to side 5 times. Repeat _3___ times per session. Do __1__ sessions per day.  Copyright  VHI. All rights reserved.

## 2015-09-24 NOTE — Telephone Encounter (Signed)
Kizzy, from [facility] called requesting a Rx for a Environmental manager.  Rx was written by Dr. Erin Hearing and faxed.  Derl Barrow, RN

## 2015-09-24 NOTE — Therapy (Signed)
[facility] [address] [place], [place], [address] Phone: [phone]   Fax:  [phone]  Physical Therapy Treatment  Patient Details  Name: Lauren [name] MRN: [id number] Date of Birth: 08-Mar-1951 Referring Provider: Dr. Wendy Poet  Encounter Date: 09/24/2015      PT End of Session - 09/24/15 1153    Visit Number 7   Number of Visits 17   Date for PT Re-Evaluation 10/15/15   Authorization Type G-code and progress note every 10th visit. No auth require per Lattie Haw   PT Start Time 1100   PT Stop Time 1143   PT Time Calculation (min) 43 min   Activity Tolerance Patient tolerated treatment well   Behavior During Therapy [facility] for tasks assessed/performed      Past Medical History  Diagnosis Date  . Depression   . Hyperlipidemia   . Hypertension   . Lung nodule     FNA ordered for 04/02/10 by HA>pos Ca  . Chronic folliculitis     of groin  . Fibrocystic breast changes   . Family history of trichomonal vaginitis 05/2005  . Postmenopausal   . Depressive disorder   . Hypercholesterolemia   . GERD (gastroesophageal reflux disease)   . Cerebral aneurysm   . Back pain   . Shortness of breath   . COPD (chronic obstructive pulmonary disease) ([place])   . Coronary artery disease   . Arthritis   . Weight loss 10/22/2011  . Status post radiation therapy 11/16/11 - 12/29/11    Right Lung and Mediastinum: 60 Gy  . Status post chemotherapy comp. 12/29/11    Carboplatin/Taxol  . S/P radiation therapy 03/01/12    SRS: 1 fraction / 20 Gray each to the Left Occipital Region and to the Right Insular Metastases  . On antineoplastic chemotherapy started 02/2012    [place]  . Fatigue 02/06/2013  . Pleural effusion 05/03/2013  . Blurred vision, bilateral 06/06/2014  . Colon cancer ([place]) 11/08  . Lung cancer ([place]) 06/16/10    PET scan 04/28/2010; primary: increase in size 02/2010 / Well Differentiated Adenocarcinoma of the lung   .  Brain metastases ([place]) 02/15/12  . Other fatigue 11/28/2014  . Bilateral pleural effusion 08/09/2013  . POSTMENOPAUSAL SYNDROME 01/31/2009    Qualifier: Diagnosis of  By: Carlena Sax  MD, Colletta [place]    . Hypokalemia 08/09/2013  . 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 Bilateral leg edema 10/17/2014  . 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 Pleural effusion, malignant 05/2013    Recurrent Pleural Effusion  . Status post stereotactic radiosurgery 01/2012    for Brain Metastases  . Tobacco dependence   . Family history of Huntington's disease   . Acute on chronic respiratory failure with hypoxemia ([place]) 07/11/2015  . Falls frequently 08/02/2015  . Benign paroxysmal positional vertigo 08/16/2015  . Vitamin D deficiency 08/30/2015  . Encounter for therapeutic drug monitoring 09/11/2015  . Encounter for antineoplastic chemotherapy 09/11/2015    Past Surgical History  Procedure Laterality Date  . Colectomy  03/22/07    Stage 1 pT2 N0, M0 Adenocarcinoma of the sigmoid  colon  . Tubal ligation    . Lung lobectomy  06/16/10    Left Upper Lobectomy  . Hernia repair    . Breast surgery      Bil lumpectomy  . Back surgery      Dr Luiz Ochoa  . Mediastinoscopy  10/19/2011    Procedure: MEDIASTINOSCOPY;  Surgeon: Gaye Pollack, MD;  Location: [facility] OR;  Service: Thoracic;  Laterality: N/A;  . Cardiac cath x3    . Chest tube insertion Right 06/12/2013    Procedure: INSERTION PLEURAL DRAINAGE CATHETER;  Surgeon: Gaye Pollack, MD;  Location: [facility];  Service: Thoracic;  Laterality: Right;  . Talc pleurodesis Right 06/12/2013    Procedure: Pietro Cassis;  Surgeon: Gaye Pollack, MD;  Location: [facility];  Service: Thoracic;  Laterality: Right;  . Tunneled venous catheter placement      Port-a-Cath    There were no vitals filed for this visit.      Subjective Assessment - 09/24/15 1109    Subjective Pt denied falls or changes since last visit.  Pt stated dizziness has been better so she doesn't want to perform BPPV  testing/treatment.   Patient is accompained by: Family member   Pertinent History Active lung CA and pt currently smokes, hx of colon and lung CA with brain mets, LVH, cerebral aneurysm, COPD, neuropathy from chemo, depressive disorder, LBP with sciatica, arthritis, HTN   Patient Stated Goals To be normal again, and IND. "I want to be in control again". Stop dizziness.   Currently in Pain? No/denies                         [facility] Adult PT Treatment/Exercise - 09/24/15 1121    Ambulation/Gait   Ambulation/Gait Yes   Ambulation/Gait Assistance 5: Supervision   Ambulation/Gait Assistance Details S to ensure safety. Pt borrowed rollator from friend, until she gets her rollator. PT adjusted handle to fold walker to ensure it does not drag on floor.   Ambulation Distance (Feet) 275 Feet  and 75'   Assistive device Rollator   Gait Pattern Decreased stride length;Decreased dorsiflexion - left;Step-through pattern;Shuffle;Narrow base of support   Ambulation Surface Level;Indoor   Gait velocity 1.74f/sec. with rollator      Neuro re-ed: Pt performed former balance HEP with cues for technique (SLS, feet apart with EC on pillows, feet together on non-compliant surfaces with head turns) and progressed balance HEP. Please see pt instructions for details. Cues for technique and performed in corner with chair for safety.     Self Care:     PT Education - 09/24/15 1150    Education provided Yes   Education Details PT discussed one more PT visit, on week when pt does not have new CA treatment, as pt feels fatigued after treatment. PT informed pt that we had not received rollator Rx and that we were checking with his office. Pt stated she has an appt there on Thursday and will ask for Rx at that time. PT edcuated pt that she can take Rx to DME company (Ex: [employer]) and they will file with insurance and order rollator. PT progressed balance HEP.   Person(s) Educated Patient;Other  (comment)  pt's sister   Methods Explanation;Tactile cues;Verbal cues;Handout;Demonstration   Comprehension Verbalized understanding;Returned demonstration          PT Short Term Goals - 08/16/15 1634    PT SHORT TERM GOAL #1   Title same as LTGs.            PT Long Term Goals - 09/24/15 1157    PT LONG TERM GOAL #1   Title Pt will be IND in HEP to improve balance and decr. dizziness to improve functional mobility. Target date:10/15/15   Status Partially Met   PT LONG TERM GOAL #2   Title Pt will improve  gait speed with LRAD to >/=1.18f/sec. to decrease falls risk.Target date: 10/15/15   Status Partially Met   PT LONG TERM GOAL #3   Title Pt will improve BERG score to >/=53/56 to decr. risk of falls. Target date: 10/15/15   Status Revised   PT LONG TERM GOAL #4   Title Pt will report </=2/10 dizziness when turning to R side while in supine position to improve safety during functional mobility. Target date: 09/13/15   Status Achieved   PT LONG TERM GOAL #5   Title Pt will amb. 500' with LRAD over even and paved surfaces at MOD I level to improve functional moiblity. Target date: 10/15/15   Status Partially Met   PT LONG TERM GOAL #6   Title Pt will improve ABC score from 1.9% to 16% to improve quality of life and confidence during gait. Target date: 45/30/17   Status Deferred               Plan - 09/24/15 1153    Clinical Impression Statement Pt partially met LTGs 1, 2, and 5. Pt continues to be limited by fatigue, as she required frequent seated rest breaks during session. Pt stated she would like to come to PT 1x/week on weeks she does not have new CA treatment, as new medication makes her very fatigued. Continue with POC.    Rehab Potential Fair   Clinical Impairments Affecting Rehab Potential co-morbidities   PT Frequency 2x / week   PT Duration 4 weeks   PT Treatment/Interventions ADLs/Self Care Home Management;Biofeedback;Canalith Repostioning;Balance  training;Manual techniques;Therapeutic exercise;Vestibular;Therapeutic activities;Functional mobility training;Stair training;Gait training;DME Instruction;Patient/family education;Orthotic Fit/Training;Cognitive remediation;Neuromuscular re-education   PT Next Visit Plan G-CODE. treat BPPV tol tolerance, progress HEP prn and gait train with new rollator.   PT Home Exercise Plan Habituation and gaze stab. HEP   Consulted and Agree with Plan of Care Patient;Family member/caregiver   Family Member Consulted sister-[name]      Patient will benefit from skilled therapeutic intervention in order to improve the following deficits and impairments:  Abnormal gait, Dizziness, Postural dysfunction, Decreased cognition, Decreased balance, Decreased mobility, Decreased knowledge of use of DME, Decreased strength  Visit Diagnosis: Dizziness and giddiness  Other abnormalities of gait and mobility     Problem List Patient Active Problem List   Diagnosis Date Noted  . Encounter for chemotherapy management 09/11/2015  . Encounter for therapeutic drug monitoring 09/11/2015  . Encounter for antineoplastic chemotherapy 09/11/2015  . Protein-calorie malnutrition, moderate ([place] 09/11/2015  . Abnormality of gait 09/10/2015  . Vitamin D deficiency 08/30/2015  . Mild cognitive impairment 08/16/2015  . Benign paroxysmal positional vertigo 08/16/2015  . Falls frequently 08/02/2015  . COPD mixed type ([place] 07/15/2015  . Dyspnea 07/15/2015  . Acute on chronic respiratory failure with hypoxemia ([place] 07/11/2015  . Postural dizziness 06/19/2015  . Protein calorie malnutrition ([place] 03/14/2015  . Family history of Huntington's disease   . LVH (left ventricular hypertrophy) 12/20/2014  . Other fatigue 11/28/2014  . Blepharitis of both eyes 11/09/2014  . Bilateral leg edema 10/17/2014  . Anemia due to chemotherapy 08/29/2014  . Neuropathy due to chemotherapeutic drug ([place] 07/19/2014  . Left-sided low back  pain with left-sided sciatica 07/18/2014  . Low back pain with sciatica 06/22/2014  . Colon cancer ([place] 05/31/2013  . Fatigue 02/06/2013  . ASCUS with positive high risk HPV 04/21/2012  . Brain metastases treated with radiosurgery 02/26/2012  . Health care maintenance 01/20/2012  . Insomnia 10/05/2011  . Cancer  of upper lobe of left lung, Adenocarcinoma   . TOBACCO ABUSE 10/04/2007  . Coronary atherosclerosis 09/09/2007  . HYPERCHOLESTEROLEMIA 12/29/2006  . DEPRESSIVE DISORDER, MAJOR, RCR, MILD 12/29/2006  . Essential hypertension 12/29/2006  . CEREBRAL ANEURYSM 12/29/2006  . GERD 12/29/2006    Lyman Balingit L 09/24/2015, 11:59 AM  [facility] [address] [place], [place], [address] Phone: [phone]   Fax:  [phone]  Name: Lauren [name] MRN: [id number] Date of Birth: 1950-10-02    Geoffry Paradise, PT,DPT 09/24/2015 11:59 AM Phone: [phone] Fax: [phone]

## 2015-09-25 ENCOUNTER — Other Ambulatory Visit: Payer: Self-pay | Admitting: *Deleted

## 2015-09-26 ENCOUNTER — Encounter: Payer: Self-pay | Admitting: Family Medicine

## 2015-09-26 ENCOUNTER — Ambulatory Visit (INDEPENDENT_AMBULATORY_CARE_PROVIDER_SITE_OTHER): Payer: Commercial Managed Care - HMO | Admitting: Family Medicine

## 2015-09-26 VITALS — BP 150/72 | HR 85 | Temp 98.3°F | Ht 64.0 in | Wt 167.0 lb

## 2015-09-26 DIAGNOSIS — G620 Drug-induced polyneuropathy: Secondary | ICD-10-CM

## 2015-09-26 DIAGNOSIS — T451X5A Adverse effect of antineoplastic and immunosuppressive drugs, initial encounter: Secondary | ICD-10-CM | POA: Diagnosis not present

## 2015-09-26 DIAGNOSIS — G6289 Other specified polyneuropathies: Secondary | ICD-10-CM | POA: Diagnosis not present

## 2015-09-26 MED ORDER — DICLOFENAC SODIUM 1 % TD GEL: 2.0000 g | Freq: Four times a day (QID) | TRANSDERMAL | Status: DC

## 2015-09-26 NOTE — Progress Notes (Signed)
    Subjective:  Lauren [name] is a 65 y.o. female who presents to the [facility] today with a chief complaint of neuropathy and imbalance.   HPI:  Neuropathy Patient has been seeing PT for issues with balance and gait, thought to be due to neuropathy secondary to chemotherapy. Patient reports that rehab is going well and that her strength and balance are improving. She needs a prescription for a walking rollator, shower chair and toilet riser. No new weakness or numbness.   ROS: Per HPI  Objective:  Physical Exam: BP 150/72 mmHg  Pulse 85  Temp(Src) 98.3 F (36.8 C) (Oral)  Ht '5\' 4"'$  (1.626 m)  Wt 167 lb (75.751 kg)  BMI 28.65 kg/m2  SpO2 100%  Gen: NAD, resting comfortably Neuro: Sensation to light touch intact in LE. Strength 5/5. Walks with cane with slow gait.  Psych: Normal affect and thought content  Assessment/Plan:  Neuropathy due to chemotherapeutic drug Orders for DME supplies including walking rollator, shower chair, and toilet riser given today.   Algis Greenhouse. Jerline Pain, [place] Resident PGY-2 09/26/2015 4:55 PM

## 2015-09-26 NOTE — Assessment & Plan Note (Signed)
Orders for DME supplies including walking rollator, shower chair, and toilet riser given today.

## 2015-09-26 NOTE — Patient Instructions (Signed)
We will give you the prescriptions you need. Take these to a medical supply store.  If this does not work, take them to your rehab center.  Take care,  Dr Jerline Pain

## 2015-09-27 ENCOUNTER — Other Ambulatory Visit: Payer: Self-pay | Admitting: *Deleted

## 2015-09-27 ENCOUNTER — Other Ambulatory Visit: Payer: Self-pay | Admitting: Licensed Clinical Social Worker

## 2015-09-27 NOTE — Patient Outreach (Signed)
[name] Care Management  09/27/2015  Lauren [name] January 24, 1951 [id number]  Telephone call from member. She wanted to discuss about the equipment and the places that [facility] would cover. RN advised member to take the prescription to both places or fax and then make a decision on the cost. Member is also talking about her new chemo that she has just started. Member calls to make [place] of all that is occurring. [facility] Care Management [phone]

## 2015-09-27 NOTE — Patient Outreach (Addendum)
[facility]) Care Management  09/27/2015  Lauren [name] [birth year] [id number]   Assessment-CSW completed outreach to patient. Patient answered. CSW informed her that she was not found eligible for the [employer] program in order to build a ramp because she lives in public housing. Patient expresses understanding. Patient has new prescription orders for medical equipment and will need to go to medical equipment companies that [place] covers. Patient reports that she has been approved for SCAT services but has not used them yet. CSW educated her on [place] transportation services again. CSW questioned if she had heard back from [employer] and declined stating "I don't want to do [employer] because I have appointments usually everyday and I don't want them to come out here when I'm not here." She reports that she cooked pork chops and cabbage yesterday. CSW educated her on the benefits of [employer] since she has had a difficult time standing up and cooking. CSW encouraged her to take the next 30 days to consider if she wishes to gain these services. She is agreeable to this. Patient had questions about private pay caregivers and CSW provided education. She shares that she would not be able to afford a private pay sitter. CSW educated her again on available personal care resources within [facility]: [employer] and [employer] and In [employer]. Patient is not agreeable to referrals because of their wait time. However, patient was educated on their programs and the benefits they can provide her. She is agreeable to thinking this over for the next 30 days and if she is agreeable then CSW will make referrals.  THN CM Care Plan Problem One        Most Recent Value   Care Plan for Problem One  Active   THN Long Term Goal (31-90 days)  Patient will gain stable transportation with SCAT within 90 day days   THN Long Term Goal Start Date  08/27/15    Interventions for Problem One Long Term Goal  CSW has completed home visit and completed SCAT application and will fax it on 08/27/15. CSW will monitor this process   THN CM Short Term Goal #1 (0-30 days)  Patient will be educated on [place] transportation within 30 days   THN CM Short Term Goal #1 Start Date  08/27/15   [facility] CM Short Term Goal #1 Met Date  09/27/15   Interventions for Short Term Goal #1  Patient has been educated on [place] transportation services and keeps information in a folder for her to use when needed.   THN CM Short Term Goal #2 (0-30 days)  Patient will take 30 days to decide if she wishes to gain Mobile Meals   [facility] CM Short Term Goal #2 Start Date  09/27/15   Interventions for Short Term Goal #2  CSW will educate patient on [employer], provide her the benefits of their program and await for her decision. If she wishes to have services, CSW will make referral.   THN CM Short Term Goal #3 (0-30 days)  Patient will decide if she wishes to gain referral for [facility] and Response Program and [facility] [place] within 30 days   [facility] CM Short Term Goal #3 Start Date  09/27/15   Interventions for Short [place] Goal #3  CSW will educate patient on the description of the programs, their wait list and their benefits. CSW will make referral to agencies if patient decides she  wants this care.       Plan-CSW will complete next outreach within two weeks to follow up on social work needs and her decision for community resource referrals.  [name], BSW, MSW, [email or web address] Phone: [phone] Fax: [phone]

## 2015-10-01 ENCOUNTER — Other Ambulatory Visit: Payer: Self-pay | Admitting: *Deleted

## 2015-10-01 NOTE — Patient Outreach (Signed)
Call placed to member to follow up on obtaining medical supplies.  She state that she has been having trouble contacting places that will supply the supplies needed under her insurance.  She state that she has tried contacting [place] with no success, and that [employer] told her that they will only be able to supply the rolling walker as they do not provide bathroom supplies.  Member state that she has gotten frustrated and "don't want to deal with it right now."  Advised that this care manager will collaborate with social worker to provide supplies needed, including hand held shower head, which she has not received a prescription for.    Will follow up with [employer], social worker, and provide update with member later this week.  Valente David, BSN, [place] Management  [facility] Care Manager [phone]

## 2015-10-02 ENCOUNTER — Other Ambulatory Visit (HOSPITAL_BASED_OUTPATIENT_CLINIC_OR_DEPARTMENT_OTHER): Payer: Commercial Managed Care - HMO

## 2015-10-02 ENCOUNTER — Ambulatory Visit (HOSPITAL_BASED_OUTPATIENT_CLINIC_OR_DEPARTMENT_OTHER): Payer: Commercial Managed Care - HMO

## 2015-10-02 ENCOUNTER — Encounter: Payer: Self-pay | Admitting: Hematology and Oncology

## 2015-10-02 ENCOUNTER — Other Ambulatory Visit: Payer: Self-pay | Admitting: Licensed Clinical Social Worker

## 2015-10-02 ENCOUNTER — Ambulatory Visit: Payer: Commercial Managed Care - HMO

## 2015-10-02 ENCOUNTER — Telehealth: Payer: Self-pay | Admitting: Hematology and Oncology

## 2015-10-02 ENCOUNTER — Ambulatory Visit (HOSPITAL_BASED_OUTPATIENT_CLINIC_OR_DEPARTMENT_OTHER): Payer: Commercial Managed Care - HMO | Admitting: Hematology and Oncology

## 2015-10-02 VITALS — BP 149/63 | HR 84 | Temp 98.0°F | Resp 18 | Ht 64.0 in | Wt 168.1 lb

## 2015-10-02 DIAGNOSIS — C7931 Secondary malignant neoplasm of brain: Secondary | ICD-10-CM | POA: Diagnosis not present

## 2015-10-02 DIAGNOSIS — Z79899 Other long term (current) drug therapy: Secondary | ICD-10-CM | POA: Diagnosis not present

## 2015-10-02 DIAGNOSIS — Z5112 Encounter for antineoplastic immunotherapy: Secondary | ICD-10-CM | POA: Diagnosis not present

## 2015-10-02 DIAGNOSIS — C3412 Malignant neoplasm of upper lobe, left bronchus or lung: Secondary | ICD-10-CM

## 2015-10-02 DIAGNOSIS — R21 Rash and other nonspecific skin eruption: Secondary | ICD-10-CM | POA: Diagnosis not present

## 2015-10-02 DIAGNOSIS — Z5181 Encounter for therapeutic drug level monitoring: Secondary | ICD-10-CM

## 2015-10-02 DIAGNOSIS — C7949 Secondary malignant neoplasm of other parts of nervous system: Secondary | ICD-10-CM

## 2015-10-02 DIAGNOSIS — D6481 Anemia due to antineoplastic chemotherapy: Secondary | ICD-10-CM

## 2015-10-02 DIAGNOSIS — Z5111 Encounter for antineoplastic chemotherapy: Secondary | ICD-10-CM

## 2015-10-02 DIAGNOSIS — Z720 Tobacco use: Secondary | ICD-10-CM

## 2015-10-02 LAB — COMPREHENSIVE METABOLIC PANEL
ALBUMIN: 2.9 g/dL — AB (ref 3.5–5.0)
AST: 10 U/L (ref 5–34)
Alkaline Phosphatase: 116 U/L (ref 40–150)
Anion Gap: 6 mEq/L (ref 3–11)
BUN: 11 mg/dL (ref 7.0–26.0)
CALCIUM: 8.9 mg/dL (ref 8.4–10.4)
CHLORIDE: 108 meq/L (ref 98–109)
CO2: 28 mEq/L (ref 22–29)
CREATININE: 1.3 mg/dL — AB (ref 0.6–1.1)
EGFR: 52 mL/min/{1.73_m2} — ABNORMAL LOW (ref >90)
GLUCOSE: 93 mg/dL (ref 70–140)
POTASSIUM: 3.6 meq/L (ref 3.5–5.1)
SODIUM: 142 meq/L (ref 136–145)
Total Bilirubin: 0.45 mg/dL (ref 0.20–1.20)
Total Protein: 6.8 g/dL (ref 6.4–8.3)

## 2015-10-02 LAB — CBC WITH DIFFERENTIAL/PLATELET
BASO%: 0.8 % (ref 0.0–2.0)
BASOS ABS: 0 10*3/uL (ref 0.0–0.1)
EOS ABS: 0.1 10*3/uL (ref 0.0–0.5)
EOS%: 2.4 % (ref 0.0–7.0)
HEMATOCRIT: 36.4 % (ref 34.8–46.6)
HEMOGLOBIN: 11.3 g/dL — AB (ref 11.6–15.9)
LYMPH#: 0.8 10*3/uL — AB (ref 0.9–3.3)
LYMPH%: 18.5 % (ref 14.0–49.7)
MCH: 25.2 pg (ref 25.1–34.0)
MCHC: 31 g/dL — ABNORMAL LOW (ref 31.5–36.0)
MCV: 81.3 fL (ref 79.5–101.0)
MONO#: 0.4 10*3/uL (ref 0.1–0.9)
MONO%: 9 % (ref 0.0–14.0)
NEUT#: 3.1 10*3/uL (ref 1.5–6.5)
NEUT%: 69.3 % (ref 38.4–76.8)
PLATELETS: 194 10*3/uL (ref 145–400)
RBC: 4.48 10*6/uL (ref 3.70–5.45)
RDW: 17.7 % — AB (ref 11.2–14.5)
WBC: 4.5 10*3/uL (ref 3.9–10.3)

## 2015-10-02 LAB — TSH: TSH: 1.514 m[IU]/L (ref 0.308–3.960)

## 2015-10-02 MED ORDER — SODIUM CHLORIDE 0.9 % IV SOLN
Freq: Once | INTRAVENOUS | Status: AC
  Administered 2015-10-02: 13:00:00 via INTRAVENOUS

## 2015-10-02 MED ORDER — HEPARIN SOD (PORK) LOCK FLUSH 100 UNIT/ML IV SOLN
500.0000 [IU] | Freq: Once | INTRAVENOUS | Status: AC | PRN
  Administered 2015-10-02: 500 [IU]
  Filled 2015-10-02: qty 5

## 2015-10-02 MED ORDER — SODIUM CHLORIDE 0.9% FLUSH
10.0000 mL | INTRAVENOUS | Status: DC | PRN
  Administered 2015-10-02: 10 mL
  Filled 2015-10-02: qty 10

## 2015-10-02 MED ORDER — SODIUM CHLORIDE 0.9 % IV SOLN
240.0000 mg | Freq: Once | INTRAVENOUS | Status: AC
  Administered 2015-10-02: 240 mg via INTRAVENOUS
  Filled 2015-10-02: qty 20

## 2015-10-02 NOTE — Assessment & Plan Note (Signed)
I spent some time counseling the patient the importance of tobacco cessation. She is currently not interested to quit now. 

## 2015-10-02 NOTE — Progress Notes (Signed)
Pt was accessed by the collaborative nurse

## 2015-10-02 NOTE — Patient Outreach (Addendum)
[facility]) Care Management  10/02/2015  CORINDA AMMON 02/09/1951 [id number]   Assessment-CSW received update from [facility]. Patient is in need of a hand head shower head nozzle per rehab specialist recommendation. CSW completed outreach to patient and completed FAF. Patient is eligible for the [facility] financial assistance program. Patient will be provided a $25.00 gift card. Patient wishes to find hand head shower head at [facility]. CSW will complete home visit tomorrow to provide gift card.  Plan-CSW will complete home visit tomorrow.  [name], BSW, MSW, [email or web address] Phone: [phone] Fax: [phone]

## 2015-10-02 NOTE — Patient Instructions (Addendum)
[facility] Discharge Instructions for Patients Receiving Chemotherapy  Today you received the following chemotherapy agents Nivolumab  To help prevent nausea and vomiting after your treatment, we encourage you to take your nausea medication   If you develop nausea and vomiting that is not controlled by your nausea medication, call the clinic.   BELOW ARE SYMPTOMS THAT SHOULD BE REPORTED IMMEDIATELY:  *FEVER GREATER THAN 100.5 F  *CHILLS WITH OR WITHOUT FEVER  NAUSEA AND VOMITING THAT IS NOT CONTROLLED WITH YOUR NAUSEA MEDICATION  *UNUSUAL SHORTNESS OF BREATH  *UNUSUAL BRUISING OR BLEEDING  TENDERNESS IN MOUTH AND THROAT WITH OR WITHOUT PRESENCE OF ULCERS  *URINARY PROBLEMS  *BOWEL PROBLEMS  UNUSUAL RASH Items with * indicate a potential emergency and should be followed up as soon as possible.  Feel free to call the clinic you have any questions or concerns. The clinic phone number is [phone].  Please show the [facility] at check-in to the Emergency Department and triage nurse.  Nivolumab injection What is this medicine? NIVOLUMAB (nye VOL ue mab) is a monoclonal antibody. It is used to treat melanoma, lung cancer, kidney cancer, and Hodgkin lymphoma. This medicine may be used for other purposes; ask your health care provider or pharmacist if you have questions. What should I tell my health care provider before I take this medicine? They need to know if you have any of these conditions: -diabetes -immune system problems -kidney disease -liver disease -lung disease -organ transplant -stomach or intestine problems -thyroid disease -an unusual or allergic reaction to nivolumab, other medicines, foods, dyes, or preservatives -pregnant or trying to get pregnant -breast-feeding How should I use this medicine? This medicine is for infusion into a vein. It is given by a health care professional in a hospital or clinic setting. A special MedGuide  will be given to you before each treatment. Be sure to read this information carefully each time. Talk to your pediatrician regarding the use of this medicine in children. Special care may be needed. Overdosage: If you think you have taken too much of this medicine contact a poison control center or emergency room at once. NOTE: This medicine is only for you. Do not share this medicine with others. What if I miss a dose? It is important not to miss your dose. Call your doctor or health care professional if you are unable to keep an appointment. What may interact with this medicine? Interactions have not been studied. Give your health care provider a list of all the medicines, herbs, non-prescription drugs, or dietary supplements you use. Also tell them if you smoke, drink alcohol, or use illegal drugs. Some items may interact with your medicine. This list may not describe all possible interactions. Give your health care provider a list of all the medicines, herbs, non-prescription drugs, or dietary supplements you use. Also tell them if you smoke, drink alcohol, or use illegal drugs. Some items may interact with your medicine. What should I watch for while using this medicine? This drug may make you feel generally unwell. Continue your course of treatment even though you feel ill unless your doctor tells you to stop. You may need blood work done while you are taking this medicine. Do not become pregnant while taking this medicine or for 5 months after stopping it. Women should inform their doctor if they wish to become pregnant or think they might be pregnant. There is a potential for serious side effects to an unborn child. Talk  to your health care professional or pharmacist for more information. Do not breast-feed an infant while taking this medicine. What side effects may I notice from receiving this medicine? Side effects that you should report to your doctor or health care professional as soon as  possible: -allergic reactions like skin rash, itching or hives, swelling of the face, lips, or tongue -black, tarry stools -blood in the urine -bloody or watery diarrhea -changes in vision -change in sex drive -changes in emotions or moods -chest pain -confusion -cough -decreased appetite -diarrhea -facial flushing -feeling faint or lightheaded -fever, chills -hair loss -hallucination, loss of contact with reality -headache -irritable -joint pain -loss of memory -muscle pain -muscle weakness -seizures -shortness of breath -signs and symptoms of high blood sugar such as dizziness; dry mouth; dry skin; fruity breath; nausea; stomach pain; increased hunger or thirst; increased urination -signs and symptoms of kidney injury like trouble passing urine or change in the amount of urine -signs and symptoms of liver injury like dark yellow or brown urine; general ill feeling or flu-like symptoms; light-colored stools; loss of appetite; nausea; right upper belly pain; unusually weak or tired; yellowing of the eyes or skin -stiff neck -swelling of the ankles, feet, hands -weight gain Side effects that usually do not require medical attention (report to your doctor or health care professional if they continue or are bothersome): -bone pain -constipation -tiredness -vomiting This list may not describe all possible side effects. Call your doctor for medical advice about side effects. You may report side effects to FDA at 1-800-FDA-1088. Where should I keep my medicine? This drug is given in a hospital or clinic and will not be stored at home. NOTE: This sheet is a summary. It may not cover all possible information. If you have questions about this medicine, talk to your doctor, pharmacist, or health care provider.    2016, Elsevier/Gold Standard. (2014-10-03 10:03:42) Nivolumab injection What is this medicine? NIVOLUMAB (nye VOL ue mab) is a monoclonal antibody. It is used to treat  melanoma, lung cancer, kidney cancer, and Hodgkin lymphoma. This medicine may be used for other purposes; ask your health care provider or pharmacist if you have questions. What should I tell my health care provider before I take this medicine? They need to know if you have any of these conditions: -diabetes -immune system problems -kidney disease -liver disease -lung disease -organ transplant -stomach or intestine problems -thyroid disease -an unusual or allergic reaction to nivolumab, other medicines, foods, dyes, or preservatives -pregnant or trying to get pregnant -breast-feeding How should I use this medicine? This medicine is for infusion into a vein. It is given by a health care professional in a hospital or clinic setting. A special MedGuide will be given to you before each treatment. Be sure to read this information carefully each time. Talk to your pediatrician regarding the use of this medicine in children. Special care may be needed. Overdosage: If you think you have taken too much of this medicine contact a poison control center or emergency room at once. NOTE: This medicine is only for you. Do not share this medicine with others. What if I miss a dose? It is important not to miss your dose. Call your doctor or health care professional if you are unable to keep an appointment. What may interact with this medicine? Interactions have not been studied. Give your health care provider a list of all the medicines, herbs, non-prescription drugs, or dietary supplements you use. Also tell them  if you smoke, drink alcohol, or use illegal drugs. Some items may interact with your medicine. This list may not describe all possible interactions. Give your health care provider a list of all the medicines, herbs, non-prescription drugs, or dietary supplements you use. Also tell them if you smoke, drink alcohol, or use illegal drugs. Some items may interact with your medicine. What should I  watch for while using this medicine? This drug may make you feel generally unwell. Continue your course of treatment even though you feel ill unless your doctor tells you to stop. You may need blood work done while you are taking this medicine. Do not become pregnant while taking this medicine or for 5 months after stopping it. Women should inform their doctor if they wish to become pregnant or think they might be pregnant. There is a potential for serious side effects to an unborn child. Talk to your health care professional or pharmacist for more information. Do not breast-feed an infant while taking this medicine. What side effects may I notice from receiving this medicine? Side effects that you should report to your doctor or health care professional as soon as possible: -allergic reactions like skin rash, itching or hives, swelling of the face, lips, or tongue -black, tarry stools -blood in the urine -bloody or watery diarrhea -changes in vision -change in sex drive -changes in emotions or moods -chest pain -confusion -cough -decreased appetite -diarrhea -facial flushing -feeling faint or lightheaded -fever, chills -hair loss -hallucination, loss of contact with reality -headache -irritable -joint pain -loss of memory -muscle pain -muscle weakness -seizures -shortness of breath -signs and symptoms of high blood sugar such as dizziness; dry mouth; dry skin; fruity breath; nausea; stomach pain; increased hunger or thirst; increased urination -signs and symptoms of kidney injury like trouble passing urine or change in the amount of urine -signs and symptoms of liver injury like dark yellow or brown urine; general ill feeling or flu-like symptoms; light-colored stools; loss of appetite; nausea; right upper belly pain; unusually weak or tired; yellowing of the eyes or skin -stiff neck -swelling of the ankles, feet, hands -weight gain Side effects that usually do not require  medical attention (report to your doctor or health care professional if they continue or are bothersome): -bone pain -constipation -tiredness -vomiting This list may not describe all possible side effects. Call your doctor for medical advice about side effects. You may report side effects to FDA at 1-800-FDA-1088. Where should I keep my medicine? This drug is given in a hospital or clinic and will not be stored at home. NOTE: This sheet is a summary. It may not cover all possible information. If you have questions about this medicine, talk to your doctor, pharmacist, or health care provider.    2016, Elsevier/Gold Standard. (2014-10-03 10:03:42)

## 2015-10-02 NOTE — Telephone Encounter (Signed)
Gave pt apt & avs °

## 2015-10-03 ENCOUNTER — Other Ambulatory Visit: Payer: Self-pay | Admitting: Licensed Clinical Social Worker

## 2015-10-03 DIAGNOSIS — R21 Rash and other nonspecific skin eruption: Secondary | ICD-10-CM

## 2015-10-03 HISTORY — DX: Rash and other nonspecific skin eruption: R21

## 2015-10-03 NOTE — Assessment & Plan Note (Signed)
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 She was started on first dose Nivolumab 2 weeks ago and tolerated that very well. I will continue treatment without dose adjustment. Plan to restage after 3 months of treatment

## 2015-10-03 NOTE — Assessment & Plan Note (Signed)
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 Continue to monitor carefully.

## 2015-10-03 NOTE — Patient Outreach (Addendum)
[facility]) Care Management  [facility] Social Work  10/03/2015  Lauren [name] 20-Oct-1950 [id number]   Encounter Medications:  Outpatient Encounter Prescriptions as of 10/03/2015  Medication Sig Note  . albuterol (PROVENTIL HFA;VENTOLIN HFA) 108 (90 BASE) MCG/ACT inhaler Inhale 1 puff into the lungs 2 (two) times daily. (Patient taking differently: Inhale 2 puffs into the lungs every 6 (six) hours as needed. )   . aspirin 81 MG chewable tablet Chew 81 mg by mouth daily. 06/08/2013: Pt takes chewable but swallows it whole  . CAPSAICIN EX Apply topically as needed. To feet   . clonazePAM (KLONOPIN) 0.5 MG tablet Take 0.5 mg by mouth 2 (two) times daily as needed. Reported on 08/28/2015 08/28/2015: Received from: External Pharmacy Received Sig:   . diclofenac sodium (VOLTAREN) 1 % GEL Apply 2 g topically 4 (four) times daily. As needed for pain   . FLUoxetine (PROZAC) 20 MG tablet TAKE 1 TABLET DAILY   . furosemide (LASIX) 20 MG tablet TAKE 1 TABLET (20 MG TOTAL) BY MOUTH DAILY.   Marland Kitchen lidocaine-prilocaine (EMLA) cream Apply topically as needed. Apply to porta cath site one hour prior to needle stick.   . nebivolol (BYSTOLIC) 10 MG tablet Take 1 tablet (10 mg total) by mouth daily.   . nitroGLYCERIN (NITROSTAT) 0.4 MG SL tablet Place 1 tablet (0.4 mg total) under the tongue every 5 (five) minutes as needed. For chest pain 12/24/2014: Old prescription  . omeprazole (PRILOSEC) 40 MG capsule TAKE 1 CAPSULE (40 MG TOTAL) BY MOUTH DAILY.   Marland Kitchen potassium chloride SA (K-DUR,KLOR-CON) 20 MEQ tablet TAKE 1 TABLET TWICE DAILY   . pravastatin (PRAVACHOL) 40 MG tablet TAKE 1 TABLET BY MOUTH ONCE DAILY   . tiZANidine (ZANAFLEX) 2 MG tablet Take 1 tablet (2 mg total) by mouth every 6 (six) hours as needed (Back Muscle Spasm). 08/01/2015: Infrequent use  . traMADol (ULTRAM) 50 MG tablet Take 1 tablet (50 mg total) by mouth every 6 (six) hours as needed for moderate pain or severe pain.   Marland Kitchen  umeclidinium-vilanterol (ANORO ELLIPTA) 62.5-25 MCG/INH AEPB Inhale 1 puff into the lungs daily.   . Vitamin D, Ergocalciferol, (DRISDOL) 50000 units CAPS capsule Take 1 capsule (50,000 Units total) by mouth every 7 (seven) days. For 12 weeks, then start taking Vitamin D 1,000 unit tablet daily.   . [DISCONTINUED] folic acid (FOLVITE) 1 MG tablet Take 1 tablet (1 mg total) by mouth daily. (Patient not taking: Reported on 10/03/2015)    Facility-Administered Encounter Medications as of 10/03/2015  Medication  . sodium chloride 0.9 % injection 10 mL    Functional Status:  In your present state of health, do you have any difficulty performing the following activities: 08/29/2015 08/27/2015  Hearing? N N  Vision? Y Y  Difficulty concentrating or making decisions? - Y  Walking or climbing stairs? - Y  Dressing or bathing? - N  Doing errands, shopping? - Y  Preparing Food and eating ? N -  Using the Toilet? N -  In the past six months, have you accidently leaked urine? Y -  Do you have problems with loss of bowel control? N -  Managing your Medications? N -  Managing your Finances? N -  Housekeeping or managing your Housekeeping? Y -    Fall/Depression Screening:  PHQ 2/9 Scores 09/27/2015 09/26/2015 08/27/2015 08/01/2015 07/31/2015 07/03/2015 06/04/2015  PHQ - 2 Score 0 0 2 0 0 1 0  PHQ- 9 Score 2 - 4 - - - -  Exception Documentation - - - - Patient refusal - -    Assessment: CSW completed home visit today on 10/03/15. Patient is in need of a hand head shower nozzle per rehab specialist recommendation. This cannot be covered by insurance and CSW assisted patient with gaining one by providing a $25.00 gift card paid by through [facility]. Patient ordered one at [facility] and can pick it up next week at store. Patient reports that maintenance at her appointment or her cousin can assist her by installing it. FAF completed and she has been approved for [facility] financial assistance program. Patient reports that she  was experiencing pain in her feet last night. Patient's left foot remains swollen like it did during last CSW home visit. Patient reports using capzasin relief gel to help with this pain. Patient reports that her friend (caregiver) has been coming over "more than I can afford to give her" to take trash out, mop floors, wash clothes, wash dishes and assist her with getting groceries at grocery store and then into her house. Patient reports that she comes over a few days out of the week and she gives her a few dollars. Patient reports that she is frustrated with contacting various medical supply companies to gain approval with her insurance to get the medical equipment that she needs. CSW contacted [facility] for patient and was able to gain a hard copy list by email that has all of the agencies and contact information that she can use to gain the medical equipment. CSW will send request to Care Management Assistant to mail this to her home.    Plan: CSW will complete next outreach within two week to ensure she gained hand held shower nozzle. CSW will continue to provide social work assistance and education on resources as needed.  THN CM Care Plan Problem One        Most Recent Value   Care Plan for Problem One  Active   THN Long Term Goal (31-90 days)  Patient will gain stable transportation with [employer] within 90 day days   THN Long Term Goal Start Date  08/27/15   [facility] Long Term Goal Met Date  10/03/15   Interventions for Problem One Long Term Goal  Patient now has transportation with [employer]. Patient can also use transportation with [facility] to medical appointments.   THN CM Short Term Goal #1 (0-30 days)  Patient will be educated on [employer] transportation within 30 days   THN CM Short Term Goal #1 Start Date  08/27/15   [facility] CM Short Term Goal #1 Met Date  09/27/15   Interventions for Short Term Goal #1  Patient has been educated on [place] transportation services and keeps information in a folder for her to use  when needed.   THN CM Short Term Goal #2 (0-30 days)  Patient will take 30 days to decide if she wishes to gain Mobile Meals   [facility] CM Short Term Goal #2 Start Date  09/27/15   Interventions for Short Term Goal #2  Patient is still in the process of making decision on whether or not she wishes to gain Mobile Meals   THN CM Short Term Goal #3 (0-30 days)  Patient will decide if she wishes to gain referral for [place] [facility] and [place] within 30 days   [facility] CM Short Term Goal #3 Start Date  09/27/15   Interventions for Short Tern Goal #3  CSW will educate patient on the  description of the programs, their wait list and their benefits. CSW will make referral to agencies if patient decides she wants this care.   THN CM Short Term Goal #4 (0-30 days)  Patient will gain a hand held shower nozzle within 30 days   THN CM Short Term Goal #4 Start Date  10/03/15   Interventions for Short Term Goal #4  FAF has been completed and a 25.00 gift card has been provided to her by [facility] in order to gain suggested medical equipement. Patient placed order for hand held shower nozzle during today's visit and can pick up equipment next week.       [name], BSW, MSW, [email or web address] Phone: [phone] Fax: [phone]

## 2015-10-03 NOTE — Assessment & Plan Note (Signed)

## 2015-10-03 NOTE — Assessment & Plan Note (Signed)
The rash is localized in her hands only. It is not causing itchiness. Examination is suspicious for some form of contact dermatitis. I recommend observation only. If you start to cause symptoms such as itchiness and discomfort, she can try over-the-counter hydrocortisone cream. I will observe only for now.

## 2015-10-03 NOTE — Progress Notes (Signed)
[place] OFFICE PROGRESS NOTE  Patient Care Team: [name], MD as PCP - General (Family Medicine) Gaye Pollack, MD (Cardiothoracic Surgery) Thea Silversmith, MD (Radiation Oncology) Dickie La, MD (Family Medicine) Heath Lark, MD as Consulting Physician (Hematology and Oncology) Renella Cunas, MD as Consulting Physician (Cardiology) Greg Cutter, LCSW as [place] Management (Licensed Clinical Social Worker) Valente David, RN as [place] Management  SUMMARY OF ONCOLOGIC HISTORY: Oncology History   Colon cancer   Primary site: Colon and Rectum (Left)   Staging method: AJCC 7th Edition   Clinical: Stage I (T2, N0, M0) signed by Heath Lark, MD on 05/31/2013  2:39 PM   Pathologic: Stage I (T2, N0, cM0) signed by Heath Lark, MD on 05/31/2013  2:39 PM   Summary: Stage I (T2, N0, cM0) Lung cancer, EGFR/ALK negative, recurrence after initial resection to LN and brain   Primary site: Lung (Left)   Staging method: AJCC 7th Edition   Clinical: Stage IV (T1, N2, M1b) signed by Heath Lark, MD on 05/31/2013  2:26 PM   Pathologic: Stage IV (T1, N2, M1b) signed by Heath Lark, MD on 05/31/2013  2:26 PM   Summary: Stage IV (T1, N2, M1b)       Cancer of upper lobe of left lung, Adenocarcinoma   03/01/2007 Procedure Colonoscopy revealed abnormalities and biopsy show high-grade dysplasia   04/01/2007 Surgery She underwent sigmoid resection which showed T2 N0 colon cancer, and negative margins and all of 17 lymph nodes were negative   02/29/2008 Procedure Repeat surveillance colonoscopy was negative.   06/16/2010 Surgery She underwent left upper lobectomy we show well-differentiated adenocarcinoma of the lung, T1, N0, M0   03/18/2011 Procedure Repeat colonoscopy show multiple polyps but there were benign   10/19/2011 Procedure Biopsy of mediastinal lymph node came back positive for recurrence of lung cancer, EGFR and ALK negative   11/16/2011  - 12/14/2011 Chemotherapy She received concurrent chemoradiation therapy with weekly carboplatin and Taxol.   11/16/2011 - 12/29/2011 Radiation Therapy She received radiation therapy with weekly chemotherapy   02/15/2012 Imaging MR of the brain showed a new intracranial metastases. This was subsequently treated with stereotactic radiosurgery.   03/07/2012 - 04/12/2013 Chemotherapy She received chemotherapy with maintainence Alimta every 3 weeks. Chemotherapy was discontinued due to profound fatigue   03/02/2013 Procedure She had therapeutic ultrasound guidance thoracentesis for pleural effusion that came back negative for cancer   05/04/2013 Procedure She had repeat ultrasound-guided thoracentesis again and cytology was negative   05/29/2013 Imaging Repeat CT scan of the chest, abdomen and pelvis show no evidence of disease but persistent right-sided pleural effusion   06/02/2013 Surgery The patient had placement of Pleurx catheter and subsequently underwent pleurodesis.   09/22/2013 Imaging Repeat CT scan show no evidence of active disease. There are nonspecific lymphadenopathy and she is placed on observation.   03/23/2014 Imaging Repeat CT scan of the chest, abdomen and pelvis show recurrence of cancer with widespread bilateral pulmonary metastasis.   05/14/2014 Imaging Imaging of the chest and brain were repeated due to delay of initiation of chemotherapy. Overall, chest CT scan show stable disease. MRI of the head was negative for recurrence   05/16/2014 - 07/18/2014 Chemotherapy  she completed 4 cycles of combination chemotherapy with carboplatin and Alimta   07/16/2014 Imaging  repeat CT scan of the chest, abdomen and pelvis show regression in the size of lung nodules.   08/29/2014 - 08/14/2015 Chemotherapy She  received maintenance Alimta   10/16/2014 Imaging  repeat CT scan of the chest, abdomen and pelvis show regression in the size of lung nodules.   01/29/2015 Imaging Repeat CT scan showed stable disease    02/01/2015 Imaging MRI brain is negative   04/30/2015 Imaging CT scan of the chest, abdomen and pelvis showed stable disease   07/25/2015 Imaging MRI brain showed no evidence of new disease   09/09/2015 Imaging CT chest showed interval increase in size of large RIGHT upper lobe nodule is most consistent with lung cancer recurrence.   09/18/2015 -  Chemotherapy She started on Nivolumab    Brain metastases treated with radiosurgery   02/26/2012 Initial Diagnosis Brain metastases treated with radiosurgery    INTERVAL HISTORY: Please see below for problem oriented charting. She is seen prior to cycle 2 of treatment. She tolerated last treatment well. She had mild fatigue but not as severe as before. She complained of new onset of skin rash that started several days ago. Denies new medication. She continues to smoke.  REVIEW OF SYSTEMS:   Constitutional: Denies fevers, chills or abnormal weight loss Eyes: Denies blurriness of vision Ears, nose, mouth, throat, and face: Denies mucositis or sore throat Respiratory: Denies cough, dyspnea or wheezes Cardiovascular: Denies palpitation, chest discomfort or lower extremity swelling Gastrointestinal:  Denies nausea, heartburn or change in bowel habits Lymphatics: Denies new lymphadenopathy or easy bruising Neurological:Denies numbness, tingling or new weaknesses Behavioral/Psych: Mood is stable, no new changes  All other systems were reviewed with the patient and are negative.  I have reviewed the past medical history, past surgical history, social history and family history with the patient and they are unchanged from previous note.  ALLERGIES:  is allergic to adhesive and lisinopril.  MEDICATIONS:  Current Outpatient Prescriptions  Medication Sig Dispense Refill  . albuterol (PROVENTIL HFA;VENTOLIN HFA) 108 (90 BASE) MCG/ACT inhaler Inhale 1 puff into the lungs 2 (two) times daily. (Patient taking differently: Inhale 2 puffs into the lungs every 6  (six) hours as needed. ) 6.7 g prn  . aspirin 81 MG chewable tablet Chew 81 mg by mouth daily.    Marland Kitchen CAPSAICIN EX Apply topically as needed. To feet    . clonazePAM (KLONOPIN) 0.5 MG tablet Take 0.5 mg by mouth 2 (two) times daily as needed. Reported on 08/28/2015    . diclofenac sodium (VOLTAREN) 1 % GEL Apply 2 g topically 4 (four) times daily. As needed for pain 100 g 3  . FLUoxetine (PROZAC) 20 MG tablet TAKE 1 TABLET DAILY 90 tablet 0  . folic acid (FOLVITE) 1 MG tablet Take 1 tablet (1 mg total) by mouth daily. 90 tablet 3  . furosemide (LASIX) 20 MG tablet TAKE 1 TABLET (20 MG TOTAL) BY MOUTH DAILY. 90 tablet 0  . lidocaine-prilocaine (EMLA) cream Apply topically as needed. Apply to porta cath site one hour prior to needle stick. 30 g 6  . nebivolol (BYSTOLIC) 10 MG tablet Take 1 tablet (10 mg total) by mouth daily. 90 tablet 4  . nitroGLYCERIN (NITROSTAT) 0.4 MG SL tablet Place 1 tablet (0.4 mg total) under the tongue every 5 (five) minutes as needed. For chest pain 25 tablet 5  . omeprazole (PRILOSEC) 40 MG capsule TAKE 1 CAPSULE (40 MG TOTAL) BY MOUTH DAILY. 90 capsule 0  . potassium chloride SA (K-DUR,KLOR-CON) 20 MEQ tablet TAKE 1 TABLET TWICE DAILY 180 tablet 0  . pravastatin (PRAVACHOL) 40 MG tablet TAKE 1 TABLET BY MOUTH ONCE DAILY  90 tablet 3  . tiZANidine (ZANAFLEX) 2 MG tablet Take 1 tablet (2 mg total) by mouth every 6 (six) hours as needed (Back Muscle Spasm). 30 tablet 3  . traMADol (ULTRAM) 50 MG tablet Take 1 tablet (50 mg total) by mouth every 6 (six) hours as needed for moderate pain or severe pain. 90 tablet 1  . umeclidinium-vilanterol (ANORO ELLIPTA) 62.5-25 MCG/INH AEPB Inhale 1 puff into the lungs daily.    . Vitamin D, Ergocalciferol, (DRISDOL) 50000 units CAPS capsule Take 1 capsule (50,000 Units total) by mouth every 7 (seven) days. For 12 weeks, then start taking Vitamin D 1,000 unit tablet daily. 12 capsule 0  . [DISCONTINUED] buPROPion (WELLBUTRIN XL) 150 MG 24 hr  tablet Take 1 tablet (150 mg total) by mouth 2 (two) times daily. 60 tablet 5   No current facility-administered medications for this visit.   Facility-Administered Medications Ordered in Other Visits  Medication Dose Route Frequency Provider Last Rate Last Dose  . sodium chloride 0.9 % injection 10 mL  10 mL Intravenous PRN Heath Lark, MD   10 mL at 09/18/15 1345    PHYSICAL EXAMINATION: ECOG PERFORMANCE STATUS: 1 - Symptomatic but completely ambulatory  Filed Vitals:   10/02/15 1130  BP: 149/63  Pulse: 84  Temp: 98 F (36.7 C)  Resp: 18   Filed Weights   10/02/15 1130  Weight: 168 lb 1.6 oz (76.25 kg)    GENERAL:alert, no distress and comfortable SKIN: Noted skin rash on her palms. EYES: normal, Conjunctiva are pink and non-injected, sclera clear OROPHARYNX:no exudate, no erythema and lips, buccal mucosa, and tongue normal  NECK: supple, thyroid normal size, non-tender, without nodularity LYMPH:  no palpable lymphadenopathy in the cervical, axillary or inguinal LUNGS: clear to auscultation and percussion with normal breathing effort HEART: regular rate & rhythm and no murmurs and no lower extremity edema ABDOMEN:abdomen soft, non-tender and normal bowel sounds Musculoskeletal:no cyanosis of digits and no clubbing  NEURO: alert & oriented x 3 with fluent speech, no focal motor/sensory deficits  LABORATORY DATA:  I have reviewed the data as listed    Component Value Date/Time   NA 142 10/02/2015 1113   NA 141 12/26/2013 0946   NA 143 09/28/2011 0833   K 3.6 10/02/2015 1113   K 4.0 12/26/2013 0946   K 3.5 09/28/2011 0833   CL 106 12/26/2013 0946   CL 102 10/24/2012 1001   CL 98 09/28/2011 0833   CO2 28 10/02/2015 1113   CO2 27 12/26/2013 0946   CO2 30 09/28/2011 0833   GLUCOSE 93 10/02/2015 1113   GLUCOSE 101* 12/26/2013 0946   GLUCOSE 111* 10/24/2012 1001   GLUCOSE 112 09/28/2011 0833   BUN 11.0 10/02/2015 1113   BUN 9 12/26/2013 0946   BUN 9 09/28/2011 0833    CREATININE 1.3* 10/02/2015 1113   CREATININE 0.83 12/26/2013 0946   CREATININE 0.7 09/28/2011 0833   CALCIUM 8.9 10/02/2015 1113   CALCIUM 8.8 12/26/2013 0946   CALCIUM 8.7 09/28/2011 0833   PROT 6.8 10/02/2015 1113   PROT 6.8 12/26/2013 0946   PROT 7.2 09/28/2011 0833   ALBUMIN 2.9* 10/02/2015 1113   ALBUMIN 3.8 12/26/2013 0946   ALBUMIN 3.2* 09/28/2011 0833   AST 10 10/02/2015 1113   AST 12 12/26/2013 0946   AST 15 09/28/2011 0833   ALT <9 10/02/2015 1113   ALT <8 12/26/2013 0946   ALT 16 09/28/2011 0833   ALKPHOS 116 10/02/2015 1113   ALKPHOS 148*  12/26/2013 0946   ALKPHOS 99* 09/28/2011 0833   BILITOT 0.45 10/02/2015 1113   BILITOT 0.6 12/26/2013 0946   BILITOT 0.80 09/28/2011 0833   GFRNONAA 74* 06/12/2013 0625   GFRAA 86* 06/12/2013 0625    No results found for: SPEP, UPEP  Lab Results  Component Value Date   WBC 4.5 10/02/2015   NEUTROABS 3.1 10/02/2015   HGB 11.3* 10/02/2015   HCT 36.4 10/02/2015   MCV 81.3 10/02/2015   PLT 194 10/02/2015      Chemistry      Component Value Date/Time   NA 142 10/02/2015 1113   NA 141 12/26/2013 0946   NA 143 09/28/2011 0833   K 3.6 10/02/2015 1113   K 4.0 12/26/2013 0946   K 3.5 09/28/2011 0833   CL 106 12/26/2013 0946   CL 102 10/24/2012 1001   CL 98 09/28/2011 0833   CO2 28 10/02/2015 1113   CO2 27 12/26/2013 0946   CO2 30 09/28/2011 0833   BUN 11.0 10/02/2015 1113   BUN 9 12/26/2013 0946   BUN 9 09/28/2011 0833   CREATININE 1.3* 10/02/2015 1113   CREATININE 0.83 12/26/2013 0946   CREATININE 0.7 09/28/2011 0833      Component Value Date/Time   CALCIUM 8.9 10/02/2015 1113   CALCIUM 8.8 12/26/2013 0946   CALCIUM 8.7 09/28/2011 0833   ALKPHOS 116 10/02/2015 1113   ALKPHOS 148* 12/26/2013 0946   ALKPHOS 99* 09/28/2011 0833   AST 10 10/02/2015 1113   AST 12 12/26/2013 0946   AST 15 09/28/2011 0833   ALT <9 10/02/2015 1113   ALT <8 12/26/2013 0946   ALT 16 09/28/2011 0833   BILITOT 0.45 10/02/2015 1113    BILITOT 0.6 12/26/2013 0946   BILITOT 0.80 09/28/2011 0833     ASSESSMENT & PLAN:  TOBACCO ABUSE I spent some time counseling the patient the importance of tobacco cessation. She is currently not interested to quit now.       Cancer of upper lobe of left lung, Adenocarcinoma Unfortunately, recent blood test with third-generation sequencing show no specific treatment available for mutation detected. She was started on first dose Nivolumab 2 weeks ago and tolerated that very well. I will continue treatment without dose adjustment. Plan to restage after 3 months of treatment   Anemia due to chemotherapy This is likely due to recent treatment. The patient denies recent history of bleeding such as epistaxis, hematuria or hematochezia. She is asymptomatic from the anemia. I will observe for now.  She does not require transfusion now. I will continue the chemotherapy at current dose without dosage adjustment.  If the anemia gets progressive worse in the future, I might have to delay her treatment or adjust the chemotherapy dose.   Brain metastases treated with radiosurgery Her most recent MRI in March 2017 showed no evidence of disease recurrence. Continue to monitor carefully.    Skin rash The rash is localized in her hands only. It is not causing itchiness. Examination is suspicious for some form of contact dermatitis. I recommend observation only. If you start to cause symptoms such as itchiness and discomfort, she can try over-the-counter hydrocortisone cream. I will observe only for now.     No orders of the defined types were placed in this encounter.   All questions were answered. The patient knows to call the clinic with any problems, questions or concerns. No barriers to learning was detected. I spent 20 minutes counseling the patient face to face. The  total time spent in the appointment was 25 minutes and more than 50% was on counseling and review of test results      [facility], [place], MD 10/03/2015 11:15 AM

## 2015-10-04 ENCOUNTER — Other Ambulatory Visit: Payer: Self-pay | Admitting: *Deleted

## 2015-10-04 NOTE — Patient Outreach (Signed)
Call received back from member, updated on information received from medical supply companies.  Made aware that [employer] will provide rollator, but insurance will not cover elevated toilet seat and shower chair.  Contact information provided or supply company.  Member expresses concern about being able to afford bathroom supplies.  Made aware that this care manager will contact social worker to collaborate on other options to obtain equipment.  Will follow up with member next week.  Valente David, BSN, [place] Management  [facility] Care Manager [phone]

## 2015-10-04 NOTE — Patient Outreach (Signed)
Call placed to member to follow up on medical supplies.  No answer, HIPPA compliant voice message left.    Prior to call to member, call placed to multiple medical equipment suppliers.  Member previously stated that several companies would not take her insurance.  However, this care manger discovered that the insurance would only cover the rollator, and she would have to purchase the shower and raised toilet seat privately.  If no call back from member today, will follow up next week to provide information and further assist with ordering equipment.  Valente David, BSN, [place] Management  [facility] Care Manager [phone]

## 2015-10-07 ENCOUNTER — Other Ambulatory Visit: Payer: Self-pay | Admitting: Licensed Clinical Social Worker

## 2015-10-07 NOTE — Patient Outreach (Signed)
[facility]) Care Management  10/07/2015  Lauren [name] 30-Mar-1951 [id number]   Assessment- CSW received in basket message from [name] requesting resource information on shower chairs and elevated toilet seats. Insurance did not approve this equipment. CSW informed RNCM that Lauren [name] has used Biomedical engineer that she can go look at and number was provided to Lauren [name]. CSW also informed RNCM that [employer] has shower chairs at times.   If patient has investigated these resources and still is unable to afford equipment then CSW can put in request for Lauren [name] but needs additional information first to ensure that ALL resources have been exhausted. CSW questioned RNCM if patient had asked family and friends for assistance to pay for equipment since she has stated that she cannot afford it out of pocket. CSW also questioned RNCM if patient could put any money into this equipment at all such as $20.00 dollars.   Plan-CSW will await to hear back from [facility].  [name], BSW, MSW, [name] Phone: [phone] Fax: [phone]

## 2015-10-08 ENCOUNTER — Other Ambulatory Visit: Payer: Self-pay | Admitting: *Deleted

## 2015-10-08 NOTE — Patient Outreach (Signed)
[facility] [employer]) Care Management  10/08/2015  SUHEILY BIRKS 1950/12/28 [id number]   Request received from [name], LCSW to mail patient insurance information. Information mailed today. 10/08/15.

## 2015-10-08 NOTE — Patient Outreach (Signed)
Call placed to member to provided additional information obtained from social worker on medical supplies.  Member provided with contact information for [employer] and [employer].  Advised to look for needed equipment with intent to purchase.  She is also advised that if affording the equipment is an issue to contact [employer], [facility] social worker.  She verbalizes understanding.    Valente David, BSN, [place] Management  [facility] Care Manager [phone]

## 2015-10-09 ENCOUNTER — Other Ambulatory Visit: Payer: Self-pay

## 2015-10-09 DIAGNOSIS — Z1231 Encounter for screening mammogram for malignant neoplasm of breast: Secondary | ICD-10-CM

## 2015-10-10 ENCOUNTER — Ambulatory Visit: Payer: Commercial Managed Care - HMO

## 2015-10-10 DIAGNOSIS — R2689 Other abnormalities of gait and mobility: Secondary | ICD-10-CM | POA: Diagnosis not present

## 2015-10-10 DIAGNOSIS — H8113 Benign paroxysmal vertigo, bilateral: Secondary | ICD-10-CM

## 2015-10-10 DIAGNOSIS — R42 Dizziness and giddiness: Secondary | ICD-10-CM | POA: Diagnosis not present

## 2015-10-10 NOTE — Therapy (Signed)
[facility] [address] [place], [place], [address] Phone: [phone]   Fax:  [phone]  Physical Therapy Treatment  Patient Details  Name: Lauren [name] MRN: [id number] Date of Birth: [birth year] Referring Provider: Dr. Wendy Poet  Encounter Date: 10/10/2015      PT End of Session - 10/10/15 1045    Visit Number 8   Number of Visits 17   Date for PT Re-Evaluation 10/15/15   Authorization Type G-code and progress note every 10th visit. No auth require per Lattie Haw   PT Start Time 1019   PT Stop Time 1043   PT Time Calculation (min) 24 min   Activity Tolerance Patient tolerated treatment well   Behavior During Therapy [facility] for tasks assessed/performed      Past Medical History  Diagnosis Date  . Depression   . Hyperlipidemia   . Hypertension   . Lung nodule     FNA ordered for 04/02/10 by HA>pos Ca  . Chronic folliculitis     of groin  . Fibrocystic breast changes   . Family history of trichomonal vaginitis 05/2005  . Postmenopausal   . Depressive disorder   . Hypercholesterolemia   . GERD (gastroesophageal reflux disease)   . Cerebral aneurysm   . Back pain   . Shortness of breath   . COPD (chronic obstructive pulmonary disease) ([place])   . Coronary artery disease   . Arthritis   . Weight loss 10/22/2011  . Status post radiation therapy 11/16/11 - 12/29/11    Right Lung and Mediastinum: 60 Gy  . Status post chemotherapy comp. 12/29/11    Carboplatin/Taxol  . S/P radiation therapy 03/01/12    SRS: 1 fraction / 20 Gray each to the Left Occipital Region and to the Right Insular Metastases  . On antineoplastic chemotherapy started 02/2012    [place]  . Fatigue 02/06/2013  . Pleural effusion 05/03/2013  . Blurred vision, bilateral 06/06/2014  . Colon cancer ([place]) 11/08  . Lung cancer ([place]) 06/16/10    PET scan 04/28/2010; primary: increase in size 02/2010 / Well Differentiated Adenocarcinoma of the lung   .  Brain metastases ([place]) 02/15/12  . Other fatigue 11/28/2014  . Bilateral pleural effusion 08/09/2013  . POSTMENOPAUSAL SYNDROME 01/31/2009    Qualifier: Diagnosis of  By: Carlena Sax  MD, Colletta [place]    . Hypokalemia 08/09/2013  . 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 Bilateral leg edema 10/17/2014  . 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 Pleural effusion, malignant 05/2013    Recurrent Pleural Effusion  . Status post stereotactic radiosurgery 01/2012    for Brain Metastases  . Tobacco dependence   . Family history of Huntington's disease   . Acute on chronic respiratory failure with hypoxemia ([place]) 07/11/2015  . Falls frequently 08/02/2015  . Benign paroxysmal positional vertigo 08/16/2015  . Vitamin D deficiency 08/30/2015  . Encounter for therapeutic drug monitoring 09/11/2015  . Encounter for antineoplastic chemotherapy 09/11/2015    Past Surgical History  Procedure Laterality Date  . Colectomy  03/22/07    Stage 1 pT2 N0, M0 Adenocarcinoma of the sigmoid  colon  . Tubal ligation    . Lung lobectomy  06/16/10    Left Upper Lobectomy  . Hernia repair    . Breast surgery      Bil lumpectomy  . Back surgery      Dr Luiz Ochoa  . Mediastinoscopy  10/19/2011    Procedure: MEDIASTINOSCOPY;  Surgeon: Gaye Pollack, MD;  Location: [facility] OR;  Service: Thoracic;  Laterality: N/A;  . Cardiac cath x3    . Chest tube insertion Right 06/12/2013    Procedure: INSERTION PLEURAL DRAINAGE CATHETER;  Surgeon: Gaye Pollack, MD;  Location: [facility];  Service: Thoracic;  Laterality: Right;  . [place] pleurodesis Right 06/12/2013    Procedure: Pietro Cassis;  Surgeon: Gaye Pollack, MD;  Location: [facility];  Service: Thoracic;  Laterality: Right;  . Tunneled venous catheter placement      Port-a-Cath    There were no vitals filed for this visit.      Subjective Assessment - 10/10/15 1024    Subjective Pt reported MD ceased folate pill and B12 injections. Pt denied falls since last visit. Pt reported fatigue today. Pt  reported she hasn't gotten rollator yet, as she hasn't been able to get to the supply store.  Pt reported her HEP is still challenging. Pt reported she is going to start walking with a friend (with rollator).  Pt would like to learn how to use pedometer.   Patient is accompained by: Family member   Pertinent History Active lung CA and pt currently smokes, hx of colon and lung CA with brain mets, LVH, cerebral aneurysm, COPD, neuropathy from chemo, depressive disorder, LBP with sciatica, arthritis, HTN   Patient Stated Goals To be normal again, and IND. "I want to be in control again". Stop dizziness.   Currently in Pain? No/denies                         [facility] Adult PT Treatment/Exercise - 10/10/15 1036    Ambulation/Gait   Ambulation/Gait Yes   Ambulation/Gait Assistance 6: Modified independent (Device/Increase time)   Ambulation/Gait Assistance Details No LOB, but noted to amb at [place]. speed 2/2 fatigue. PT also showed pt how to operate the pedometer she brought to PT appt., however, it did not work.    Ambulation Distance (Feet) 300 Feet  and 75'   Assistive device Rollator   Gait Pattern Decreased stride length;Step-through pattern   Ambulation Surface Level;Indoor   Gait velocity 1.89f/sec. with rollator   Balance   Balance Assessed Yes   Static Standing Balance   Static Standing - Balance Support No upper extremity supported   Static Standing - Level of Assistance 5: Stand by assistance   Single Leg Stance - Right Leg 5  sec.   Single Leg Stance - Left Leg 6  sec.       ABC: 33.8% (completed after PT treatment)    Self Care:     PT Education - 10/10/15 1043    Education provided Yes   Education Details PT discussed the importance of continuing HEP once PT d/c. PT also discussed the importance of contacting [employer] again (as that's the company pt chose), to get a quote on how much insurance will cover for rollator and how much pt would be  responsible for. Pt is currently using rollator loaned to her by a frined. Pt discussed the need for a new referral if pt experiencing a change in status, or if she decided she would like to further treat vertigo. Pt reported she's satisfied with current dizziness level <2/10. PT discussed making sure rollator height is the same as rollator she has borrowed.  Pt's sister, SJudson Roch present at end of session and witnessed self care education.   Person(s) Educated Patient   Methods Explanation   Comprehension Verbalized understanding  PT Short Term Goals - 08/16/15 1634    PT SHORT TERM GOAL #1   Title same as LTGs.            PT Long Term Goals - 10/24/2015 1047    PT LONG TERM GOAL #1   Title Pt will be IND in HEP to improve balance and decr. dizziness to improve functional mobility. Target date:10/15/15   Status Partially Met   PT LONG TERM GOAL #2   Title Pt will improve gait speed with LRAD to >/=1.53f/sec. to decrease falls risk.Target date: 10/15/15   Status Partially Met   PT LONG TERM GOAL #3   Title Pt will improve BERG score to >/=53/56 to decr. risk of falls. Target date: 10/15/15   Status Revised   PT LONG TERM GOAL #4   Title Pt will report </=2/10 dizziness when turning to R side while in supine position to improve safety during functional mobility. Target date: 09/13/15   Status Achieved   PT LONG TERM GOAL #5   Title Pt will amb. 500' with LRAD over even and paved surfaces at MOD I level to improve functional moiblity. Target date: 10/15/15   Status Partially Met   PT LONG TERM GOAL #6   Title Pt will improve ABC score from 1.9% to 16% to improve quality of life and confidence during gait. Target date: 45/30/17   Status Achieved               Plan - [id number]    Clinical Impression Statement Pt demonstrated progress, as she utilized rollator in safe manner. Pt did require rest breaks 2/2 fatigue. Pt met LTG 6. Please see d/c summary for details.       Patient will benefit from skilled therapeutic intervention in order to improve the following deficits and impairments:     Visit Diagnosis: Other abnormalities of gait and mobility  Dizziness and giddiness  BPPV (benign paroxysmal positional vertigo), bilateral       G-Codes - 0June 08, [id number]    Functional Assessment Tool Used Gait speed: 1.673fsec. with rollator; ABC: 33.8%, R SLS: 5sec. prior to requiring min A to maintain balance.   Functional Limitation Mobility: Walking and moving around   Mobility: Walking and Moving Around Goal Status ([id number] least 20 percent but less than 40 percent impaired, limited or restricted   Mobility: Walking and Moving Around Discharge Status ([phone] least 20 percent but less than 40 percent impaired, limited or restricted      Problem List Patient Active Problem List   Diagnosis Date Noted  . Skin rash 10/03/2015  . Encounter for chemotherapy management 09/11/2015  . Encounter for therapeutic drug monitoring 09/11/2015  . Encounter for antineoplastic chemotherapy 09/11/2015  . Protein-calorie malnutrition, moderate ([place]04/26/2017  . Abnormality of gait 09/10/2015  . Vitamin D deficiency 08/30/2015  . Mild cognitive impairment 08/16/2015  . Benign paroxysmal positional vertigo 08/16/2015  . Falls frequently 08/02/2015  . COPD mixed type ([place]CT[place]02/27/2017  . Dyspnea 07/15/2015  . Acute on chronic respiratory failure with hypoxemia ([facility]CSleep[facility] Eye02/23/2017  . Postural dizziness 06/19/2015  . Protein calorie malnutrition ([facility]CP[place]10/27/2016  . Family history of Huntington's disease   . LVH (left ventricular hypertrophy) 12/20/2014  . Other fatigue 11/28/2014  . Blepharitis of both eyes 11/09/2014  . Bilateral leg edema 10/17/2014  . Anemia due to chemotherapy 08/29/2014  . Neuropathy due to chemotherapeutic drug (HCFulton03/07/2014  . Left-sided low back pain with left-sided sciatica 07/18/2014  .  Low back pain with sciatica 06/22/2014  .  Colon cancer ([place]) 05/31/2013  . Fatigue 02/06/2013  . ASCUS with positive high risk HPV 04/21/2012  . Brain metastases treated with radiosurgery 02/26/2012  . Health care maintenance 01/20/2012  . Insomnia 10/05/2011  . Cancer of upper lobe of left lung, Adenocarcinoma   . TOBACCO ABUSE 10/04/2007  . Coronary atherosclerosis 09/09/2007  . HYPERCHOLESTEROLEMIA 12/29/2006  . DEPRESSIVE DISORDER, MAJOR, RCR, MILD 12/29/2006  . Essential hypertension 12/29/2006  . CEREBRAL ANEURYSM 12/29/2006  . GERD 12/29/2006    Shamarie Call L 10/10/2015, 10:59 AM  [facility] [address] [place], [place], [address] Phone: [phone]   Fax:  [phone]  Name: Lauren [name] MRN: [id number] Date of Birth: 1950-08-05  PHYSICAL THERAPY DISCHARGE SUMMARY  Visits from Start of Care: 8  Current functional level related to goals / functional outcomes:     PT Long Term Goals - 10/10/15 1047    PT LONG TERM GOAL #1   Title Pt will be IND in HEP to improve balance and decr. dizziness to improve functional mobility. Target date:10/15/15   Status Partially Met   PT LONG TERM GOAL #2   Title Pt will improve gait speed with LRAD to >/=1.55f/sec. to decrease falls risk.Target date: 10/15/15   Status Partially Met   PT LONG TERM GOAL #3   Title Pt will improve BERG score to >/=53/56 to decr. risk of falls. Target date: 10/15/15   Status Revised   PT LONG TERM GOAL #4   Title Pt will report </=2/10 dizziness when turning to R side while in supine position to improve safety during functional mobility. Target date: 09/13/15   Status Achieved   PT LONG TERM GOAL #5   Title Pt will amb. 500' with LRAD over even and paved surfaces at MOD I level to improve functional moiblity. Target date: 10/15/15   Status Partially Met   PT LONG TERM GOAL #6   Title Pt will improve ABC score from 1.9% to 16% to improve quality of life and confidence  during gait. Target date: 45/30/17   Status Achieved        Remaining deficits: Fatigue   Education / Equipment: HEP and rollator Rx  Plan: Patient agrees to discharge.  Patient goals were partially met. Patient is being discharged due to being pleased with the current functional level.  ?????       JGeoffry Paradise PT,DPT 10/10/2015 10:59 AM Phone: [phone]: [phone]

## 2015-10-15 ENCOUNTER — Encounter: Payer: Self-pay | Admitting: Hematology and Oncology

## 2015-10-15 ENCOUNTER — Telehealth: Payer: Self-pay | Admitting: *Deleted

## 2015-10-15 ENCOUNTER — Telehealth: Payer: Self-pay | Admitting: Hematology and Oncology

## 2015-10-15 ENCOUNTER — Ambulatory Visit (HOSPITAL_BASED_OUTPATIENT_CLINIC_OR_DEPARTMENT_OTHER): Payer: Commercial Managed Care - HMO | Admitting: Hematology and Oncology

## 2015-10-15 VITALS — BP 150/69 | HR 83 | Temp 98.0°F | Resp 18 | Ht 64.0 in | Wt 171.2 lb

## 2015-10-15 DIAGNOSIS — T451X5A Adverse effect of antineoplastic and immunosuppressive drugs, initial encounter: Principal | ICD-10-CM

## 2015-10-15 DIAGNOSIS — C3412 Malignant neoplasm of upper lobe, left bronchus or lung: Secondary | ICD-10-CM | POA: Diagnosis not present

## 2015-10-15 DIAGNOSIS — G620 Drug-induced polyneuropathy: Secondary | ICD-10-CM | POA: Diagnosis not present

## 2015-10-15 DIAGNOSIS — R600 Localized edema: Secondary | ICD-10-CM | POA: Diagnosis not present

## 2015-10-15 DIAGNOSIS — R21 Rash and other nonspecific skin eruption: Secondary | ICD-10-CM

## 2015-10-15 MED ORDER — TRIAMCINOLONE ACETONIDE 0.5 % EX OINT: 1.0000 "application " | TOPICAL_OINTMENT | Freq: Two times a day (BID) | CUTANEOUS | Status: DC

## 2015-10-15 MED ORDER — GABAPENTIN 300 MG PO CAPS
300.0000 mg | ORAL_CAPSULE | Freq: Three times a day (TID) | ORAL | Status: DC
Start: 2015-10-15 — End: 2016-06-19

## 2015-10-15 MED FILL — GABAPENTIN 300 MG CAPSULE: 300 | 30 days supply | Qty: 90 | Fill #0

## 2015-10-15 MED FILL — TRIAMCINOLONE 0.5% OINTMENT: 0.5 | 14 days supply | Qty: 30 | Fill #0

## 2015-10-15 NOTE — Telephone Encounter (Signed)
Pt reports ongoing rash/ peeling skin on hands and now some "bumps" under her toes.  She denies any itching or pain.  She c/o "Neuropathy pain" in her left heel last night shooting up to her thigh.  Describes as a shooting pain unrelieved w/ taking 3 Tramadol,  Tizanidine,  Voltaren Gel, and Capsaicin cream..    She was unable to sleep d/t pain.  Denies any worsening pain w/ walking,  Seems worse at night than during the day.  Denies any pain on right foot/leg. She will be here for treatment tomorrow.

## 2015-10-15 NOTE — Assessment & Plan Note (Signed)
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 She was started on first dose Nivolumab 2 weeks ago and tolerated that very well. I will continue treatment without dose adjustment. Plan to restage after 3 months of treatment

## 2015-10-15 NOTE — Telephone Encounter (Signed)
Can you ask her to come in at 12 pm for evaluation? Please place POF, 30 mins. Keep other appt as is

## 2015-10-15 NOTE — Telephone Encounter (Signed)
Dr. Alvy Bimler says she can see pt tomorrow before her treatment if she prefers.   Informed pt she can come tomorrow instead of today if she wants.  Pt prefers to just come today since she is already getting ready.

## 2015-10-15 NOTE — Telephone Encounter (Signed)
spoke w/ pt confirmed 1200 apt for today

## 2015-10-15 NOTE — Assessment & Plan Note (Signed)
It is very particular as it only affects her hands and her feet. She is quite asymptomatic. I recommend topical triamcinolone cream for 7 days.

## 2015-10-15 NOTE — Telephone Encounter (Signed)
Informed pt of appt today at 12 noon.  She will plan to be here at 12.

## 2015-10-15 NOTE — Assessment & Plan Note (Signed)
She is bothered by neuropathy. I recommend trial of gabapentin and one half about risk of constipation and sedation. I will reassess when I see her back in 2 weeks

## 2015-10-15 NOTE — Assessment & Plan Note (Signed)
She has bilateral lower extremity edema, left greater than the right. This is likely related to low albumin with poor circulation. She had colon cancer and some of the lymph nodes were removed. She will continue intermittently doses of Lasix as tolerated.

## 2015-10-15 NOTE — Progress Notes (Signed)
[facility] OFFICE PROGRESS NOTE  Patient Care Team: [name], MD as PCP - General (Family Medicine) Gaye Pollack, MD (Cardiothoracic Surgery) Thea Silversmith, MD (Radiation Oncology) Dickie La, MD (Family Medicine) Heath Lark, MD as Consulting Physician (Hematology and Oncology) Renella Cunas, MD as Consulting Physician (Cardiology) Greg Cutter, LCSW as [place] [facility] (Licensed Clinical Social Worker) Valente David, RN as [facility]  SUMMARY OF ONCOLOGIC HISTORY: Oncology History   Colon cancer   Primary site: Colon and Rectum (Left)   Staging method: AJCC 7th Edition   Clinical: Stage I (T2, N0, M0) signed by Heath Lark, MD on 05/31/2013  2:39 PM   Pathologic: Stage I (T2, N0, cM0) signed by Heath Lark, MD on 05/31/2013  2:39 PM   Summary: Stage I (T2, N0, cM0) Lung cancer, EGFR/ALK negative, recurrence after initial resection to LN and brain   Primary site: Lung (Left)   Staging method: AJCC 7th Edition   Clinical: Stage IV (T1, N2, M1b) signed by Heath Lark, MD on 05/31/2013  2:26 PM   Pathologic: Stage IV (T1, N2, M1b) signed by Heath Lark, MD on 05/31/2013  2:26 PM   Summary: Stage IV (T1, N2, M1b)       Cancer of upper lobe of left lung, Adenocarcinoma   03/01/2007 Procedure Colonoscopy revealed abnormalities and biopsy show high-grade dysplasia   04/01/2007 Surgery She underwent sigmoid resection which showed T2 N0 colon cancer, and negative margins and all of 17 lymph nodes were negative   02/29/2008 Procedure Repeat surveillance colonoscopy was negative.   06/16/2010 Surgery She underwent left upper lobectomy we show well-differentiated adenocarcinoma of the lung, T1, N0, M0   03/18/2011 Procedure Repeat colonoscopy show multiple polyps but there were benign   10/19/2011 Procedure Biopsy of mediastinal lymph node came back positive for recurrence of lung cancer, EGFR and ALK negative   11/16/2011  - 12/14/2011 Chemotherapy She received concurrent chemoradiation therapy with weekly carboplatin and Taxol.   11/16/2011 - 12/29/2011 Radiation Therapy She received radiation therapy with weekly chemotherapy   02/15/2012 Imaging MR of the brain showed a new intracranial metastases. This was subsequently treated with stereotactic radiosurgery.   03/07/2012 - 04/12/2013 Chemotherapy She received chemotherapy with maintainence Alimta every 3 weeks. Chemotherapy was discontinued due to profound fatigue   03/02/2013 Procedure She had therapeutic ultrasound guidance thoracentesis for pleural effusion that came back negative for cancer   05/04/2013 Procedure She had repeat ultrasound-guided thoracentesis again and cytology was negative   05/29/2013 Imaging Repeat CT scan of the chest, abdomen and pelvis show no evidence of disease but persistent right-sided pleural effusion   06/02/2013 Surgery The patient had placement of Pleurx catheter and subsequently underwent pleurodesis.   09/22/2013 Imaging Repeat CT scan show no evidence of active disease. There are nonspecific lymphadenopathy and she is placed on observation.   03/23/2014 Imaging Repeat CT scan of the chest, abdomen and pelvis show recurrence of cancer with widespread bilateral pulmonary metastasis.   05/14/2014 Imaging Imaging of the chest and brain were repeated due to delay of initiation of chemotherapy. Overall, chest CT scan show stable disease. MRI of the head was negative for recurrence   05/16/2014 - 07/18/2014 Chemotherapy  she completed 4 cycles of combination chemotherapy with carboplatin and Alimta   07/16/2014 Imaging  repeat CT scan of the chest, abdomen and pelvis show regression in the size of lung nodules.   08/29/2014 - 08/14/2015 Chemotherapy She  received maintenance Alimta   10/16/2014 Imaging  repeat CT scan of the chest, abdomen and pelvis show regression in the size of lung nodules.   01/29/2015 Imaging Repeat CT scan showed stable disease    02/01/2015 Imaging MRI brain is negative   04/30/2015 Imaging CT scan of the chest, abdomen and pelvis showed stable disease   07/25/2015 Imaging MRI brain showed no evidence of new disease   09/09/2015 Imaging CT chest showed interval increase in size of large RIGHT upper lobe nodule is most consistent with lung cancer recurrence.   09/18/2015 -  Chemotherapy She started on Nivolumab    Brain metastases treated with radiosurgery   02/26/2012 Initial Diagnosis Brain metastases treated with radiosurgery    INTERVAL HISTORY: Please see below for problem oriented charting. She is seen urgently today because of skin rash affecting her hands and the feet. It does not it. 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 She is taking tramadol but it does not seem to help much. She denies focal neurological deficit  REVIEW  OF SYSTEMS:   Constitutional: Denies fevers, chills or abnormal weight loss Eyes: Denies blurriness of vision Ears, nose, mouth, throat, and face: Denies mucositis or sore throat Respiratory: Denies cough, dyspnea or wheezes Cardiovascular: Denies palpitation, chest discomfort  Gastrointestinal:  Denies nausea, heartburn or change in bowel habits Lymphatics: Denies new lymphadenopathy or easy bruising Neurological:Denies numbness, tingling or new weaknesses Behavioral/Psych: Mood is stable, no new changes  All other systems were reviewed with the patient and are negative.  I have reviewed the past medical history, past surgical history, social history and family history with the patient and they are unchanged from previous note.  ALLERGIES:  is allergic to adhesive and lisinopril.  MEDICATIONS:  Current Outpatient Prescriptions  Medication Sig Dispense Refill  . albuterol (PROVENTIL HFA;VENTOLIN HFA) 108 (90 BASE) MCG/ACT inhaler Inhale 1 puff into the lungs 2 (two) times daily. (Patient  taking differently: Inhale 2 puffs into the lungs every 6 (six) hours as needed. ) 6.7 g prn  . aspirin 81 MG chewable tablet Chew 81 mg by mouth daily.    Marland Kitchen CAPSAICIN EX Apply topically as needed. To feet    . cholecalciferol (VITAMIN D) 1000 units tablet Take 1,000 Units by mouth daily.    . clonazePAM (KLONOPIN) 0.5 MG tablet Take 0.5 mg by mouth 2 (two) times daily as needed. Reported on 08/28/2015    . diclofenac sodium (VOLTAREN) 1 % GEL Apply 2 g topically 4 (four) times daily. As needed for pain 100 g 3  . FLUoxetine (PROZAC) 20 MG tablet TAKE 1 TABLET DAILY 90 tablet 0  . furosemide (LASIX) 20 MG tablet TAKE 1 TABLET (20 MG TOTAL) BY MOUTH DAILY. 90 tablet 0  . lidocaine-prilocaine (EMLA) cream Apply topically as needed. Apply to porta cath site one hour prior to needle stick. 30 g 6  . nebivolol (BYSTOLIC) 10 MG tablet Take 1 tablet (10 mg total) by mouth daily. 90 tablet 4  . nitroGLYCERIN (NITROSTAT) 0.4 MG SL tablet Place 1 tablet (0.4 mg total) under the tongue every 5 (five) minutes as needed. For chest pain 25 tablet 5  . omeprazole (PRILOSEC) 40 MG capsule TAKE 1 CAPSULE (40 MG TOTAL) BY MOUTH DAILY. 90 capsule 0  . potassium chloride SA (K-DUR,KLOR-CON) 20 MEQ tablet TAKE 1 TABLET TWICE DAILY 180 tablet 0  . pravastatin (PRAVACHOL) 40  MG tablet TAKE 1 TABLET BY MOUTH ONCE DAILY 90 tablet 3  . tiZANidine (ZANAFLEX) 2 MG tablet Take 1 tablet (2 mg total) by mouth every 6 (six) hours as needed (Back Muscle Spasm). 30 tablet 3  . traMADol (ULTRAM) 50 MG tablet Take 1 tablet (50 mg total) by mouth every 6 (six) hours as needed for moderate pain or severe pain. 90 tablet 1  . umeclidinium-vilanterol (ANORO ELLIPTA) 62.5-25 MCG/INH AEPB Inhale 1 puff into the lungs daily.    Marland Kitchen gabapentin (NEURONTIN) 300 MG capsule Take 1 capsule (300 mg total) by mouth 3 (three) times daily. 90 capsule 1  . triamcinolone ointment (KENALOG) 0.5 % Apply 1 application topically 2 (two) times daily. 30 g 0   . [DISCONTINUED] buPROPion (WELLBUTRIN XL) 150 MG 24 hr tablet Take 1 tablet (150 mg total) by mouth 2 (two) times daily. 60 tablet 5   No current facility-administered medications for this visit.   Facility-Administered Medications Ordered in Other Visits  Medication Dose Route Frequency Provider Last Rate Last Dose  . sodium chloride 0.9 % injection 10 mL  10 mL Intravenous PRN Artis Delay, MD   10 mL at 09/18/15 1345    PHYSICAL EXAMINATION: ECOG PERFORMANCE STATUS: 1 - Symptomatic but completely ambulatory  Filed Vitals:   10/15/15 1153  BP: 150/69  Pulse: 83  Temp: 98 F (36.7 C)  Resp: 18   Filed Weights   10/15/15 1153  Weight: 171 lb 3.2 oz (77.656 kg)    GENERAL:alert, no distress and comfortable SKIN: She has mild skin rash affecting the hands and the feet without significant skin desquamation EYES: normal, Conjunctiva are pink and non-injected, sclera clear OROPHARYNX:no exudate, no erythema and lips, buccal mucosa, and tongue normal  HEART: mild bilateral lower extremity edema NEURO: alert & oriented x 3 with fluent speech, no focal motor/sensory deficits  LABORATORY DATA:  I have reviewed the data as listed    Component Value Date/Time   NA 142 10/02/2015 1113   NA 141 12/26/2013 0946   NA 143 09/28/2011 0833   K 3.6 10/02/2015 1113   K 4.0 12/26/2013 0946   K 3.5 09/28/2011 0833   CL 106 12/26/2013 0946   CL 102 10/24/2012 1001   CL 98 09/28/2011 0833   CO2 28 10/02/2015 1113   CO2 27 12/26/2013 0946   CO2 30 09/28/2011 0833   GLUCOSE 93 10/02/2015 1113   GLUCOSE 101* 12/26/2013 0946   GLUCOSE 111* 10/24/2012 1001   GLUCOSE 112 09/28/2011 0833   BUN 11.0 10/02/2015 1113   BUN 9 12/26/2013 0946   BUN 9 09/28/2011 0833   CREATININE 1.3* 10/02/2015 1113   CREATININE 0.83 12/26/2013 0946   CREATININE 0.7 09/28/2011 0833   CALCIUM 8.9 10/02/2015 1113   CALCIUM 8.8 12/26/2013 0946   CALCIUM 8.7 09/28/2011 0833   PROT 6.8 10/02/2015 1113   PROT  6.8 12/26/2013 0946   PROT 7.2 09/28/2011 0833   ALBUMIN 2.9* 10/02/2015 1113   ALBUMIN 3.8 12/26/2013 0946   ALBUMIN 3.2* 09/28/2011 0833   AST 10 10/02/2015 1113   AST 12 12/26/2013 0946   AST 15 09/28/2011 0833   ALT <9 10/02/2015 1113   ALT <8 12/26/2013 0946   ALT 16 09/28/2011 0833   ALKPHOS 116 10/02/2015 1113   ALKPHOS 148* 12/26/2013 0946   ALKPHOS 99* 09/28/2011 0833   BILITOT 0.45 10/02/2015 1113   BILITOT 0.6 12/26/2013 0946   BILITOT 0.80 09/28/2011 0833   GFRNONAA 74*  06/12/2013 0625   GFRAA 86* 06/12/2013 0625    No results found for: SPEP, UPEP  Lab Results  Component Value Date   WBC 4.5 10/02/2015   NEUTROABS 3.1 10/02/2015   HGB 11.3* 10/02/2015   HCT 36.4 10/02/2015   MCV 81.3 10/02/2015   PLT 194 10/02/2015      Chemistry      Component Value Date/Time   NA 142 10/02/2015 1113   NA 141 12/26/2013 0946   NA 143 09/28/2011 0833   K 3.6 10/02/2015 1113   K 4.0 12/26/2013 0946   K 3.5 09/28/2011 0833   CL 106 12/26/2013 0946   CL 102 10/24/2012 1001   CL 98 09/28/2011 0833   CO2 28 10/02/2015 1113   CO2 27 12/26/2013 0946   CO2 30 09/28/2011 0833   BUN 11.0 10/02/2015 1113   BUN 9 12/26/2013 0946   BUN 9 09/28/2011 0833   CREATININE 1.3* 10/02/2015 1113   CREATININE 0.83 12/26/2013 0946   CREATININE 0.7 09/28/2011 0833      Component Value Date/Time   CALCIUM 8.9 10/02/2015 1113   CALCIUM 8.8 12/26/2013 0946   CALCIUM 8.7 09/28/2011 0833   ALKPHOS 116 10/02/2015 1113   ALKPHOS 148* 12/26/2013 0946   ALKPHOS 99* 09/28/2011 0833   AST 10 10/02/2015 1113   AST 12 12/26/2013 0946   AST 15 09/28/2011 0833   ALT <9 10/02/2015 1113   ALT <8 12/26/2013 0946   ALT 16 09/28/2011 0833   BILITOT 0.45 10/02/2015 1113   BILITOT 0.6 12/26/2013 0946   BILITOT 0.80 09/28/2011 0833      ASSESSMENT & PLAN:  Cancer of upper lobe of left lung, Adenocarcinoma Unfortunately, recent blood test with third-generation sequencing show no specific  treatment available for mutation detected. She was started on first dose Nivolumab 2 weeks ago and tolerated that very well. I will continue treatment without dose adjustment. Plan to restage after 3 months of treatment   Skin rash It is very particular as it only affects her hands and her feet. She is quite asymptomatic. I recommend topical triamcinolone cream for 7 days.  Neuropathy due to chemotherapeutic drug She is bothered by neuropathy. I recommend trial of gabapentin and one half about risk of constipation and sedation. I will reassess when I see her back in 2 weeks  Bilateral leg edema She has bilateral lower extremity edema, left greater than the right. This is likely related to low albumin with poor circulation. She had colon cancer and some of the lymph nodes were removed. She will continue intermittently doses of Lasix as tolerated.      No orders of the defined types were placed in this encounter.   All questions were answered. The patient knows to call the clinic with any problems, questions or concerns. No barriers to learning was detected. I spent 15 minutes counseling the patient face to face. The total time spent in the appointment was 20 minutes and more than 50% was on counseling and review of test results     [facility], Judyann Casasola, MD 10/15/2015 12:21 PM

## 2015-10-16 ENCOUNTER — Ambulatory Visit: Payer: Self-pay

## 2015-10-16 ENCOUNTER — Other Ambulatory Visit: Payer: Self-pay

## 2015-10-16 ENCOUNTER — Telehealth: Payer: Self-pay | Admitting: *Deleted

## 2015-10-16 NOTE — Telephone Encounter (Signed)
No call

## 2015-10-17 ENCOUNTER — Other Ambulatory Visit: Payer: Self-pay | Admitting: Licensed Clinical Social Worker

## 2015-10-18 ENCOUNTER — Other Ambulatory Visit (HOSPITAL_BASED_OUTPATIENT_CLINIC_OR_DEPARTMENT_OTHER): Payer: Commercial Managed Care - HMO

## 2015-10-18 ENCOUNTER — Ambulatory Visit: Payer: Commercial Managed Care - HMO

## 2015-10-18 ENCOUNTER — Ambulatory Visit (HOSPITAL_BASED_OUTPATIENT_CLINIC_OR_DEPARTMENT_OTHER): Payer: Commercial Managed Care - HMO

## 2015-10-18 VITALS — BP 144/77 | HR 78 | Temp 98.3°F | Resp 18

## 2015-10-18 DIAGNOSIS — C3412 Malignant neoplasm of upper lobe, left bronchus or lung: Secondary | ICD-10-CM | POA: Diagnosis not present

## 2015-10-18 DIAGNOSIS — C7949 Secondary malignant neoplasm of other parts of nervous system: Secondary | ICD-10-CM

## 2015-10-18 DIAGNOSIS — Z5112 Encounter for antineoplastic immunotherapy: Secondary | ICD-10-CM

## 2015-10-18 DIAGNOSIS — C7931 Secondary malignant neoplasm of brain: Secondary | ICD-10-CM

## 2015-10-18 DIAGNOSIS — Z5111 Encounter for antineoplastic chemotherapy: Secondary | ICD-10-CM

## 2015-10-18 DIAGNOSIS — Z79899 Other long term (current) drug therapy: Secondary | ICD-10-CM | POA: Diagnosis not present

## 2015-10-18 DIAGNOSIS — Z5181 Encounter for therapeutic drug level monitoring: Secondary | ICD-10-CM

## 2015-10-18 LAB — COMPREHENSIVE METABOLIC PANEL
ALT: <9 U/L (ref 0–55)
ANION GAP: 8 meq/L (ref 3–11)
AST: 10 U/L (ref 5–34)
Albumin: 2.9 g/dL — ABNORMAL LOW (ref 3.5–5.0)
Alkaline Phosphatase: 121 U/L (ref 40–150)
BUN: 10.5 mg/dL (ref 7.0–26.0)
CALCIUM: 8.6 mg/dL (ref 8.4–10.4)
CHLORIDE: 111 meq/L — AB (ref 98–109)
CO2: 26 meq/L (ref 22–29)
CREATININE: 1.4 mg/dL — AB (ref 0.6–1.1)
EGFR: 46 mL/min/{1.73_m2} — AB (ref >90)
Glucose: 115 mg/dl (ref 70–140)
POTASSIUM: 3.2 meq/L — AB (ref 3.5–5.1)
Sodium: 144 mEq/L (ref 136–145)
Total Bilirubin: 0.37 mg/dL (ref 0.20–1.20)
Total Protein: 6.9 g/dL (ref 6.4–8.3)

## 2015-10-18 LAB — CBC WITH DIFFERENTIAL/PLATELET
BASO%: 1 % (ref 0.0–2.0)
BASOS ABS: 0.1 10*3/uL (ref 0.0–0.1)
EOS ABS: 0.1 10*3/uL (ref 0.0–0.5)
EOS%: 1.6 % (ref 0.0–7.0)
HEMATOCRIT: 37.3 % (ref 34.8–46.6)
HGB: 11.4 g/dL — ABNORMAL LOW (ref 11.6–15.9)
LYMPH#: 1.1 10*3/uL (ref 0.9–3.3)
LYMPH%: 19.7 % (ref 14.0–49.7)
MCH: 24.9 pg — AB (ref 25.1–34.0)
MCHC: 30.6 g/dL — AB (ref 31.5–36.0)
MCV: 81.4 fL (ref 79.5–101.0)
MONO#: 0.4 10*3/uL (ref 0.1–0.9)
MONO%: 6.4 % (ref 0.0–14.0)
NEUT#: 4.1 10*3/uL (ref 1.5–6.5)
NEUT%: 71.3 % (ref 38.4–76.8)
PLATELETS: 190 10*3/uL (ref 145–400)
RBC: 4.59 10*6/uL (ref 3.70–5.45)
RDW: 17.4 % — ABNORMAL HIGH (ref 11.2–14.5)
WBC: 5.8 10*3/uL (ref 3.9–10.3)

## 2015-10-18 LAB — TSH: TSH: 0.662 m[IU]/L (ref 0.308–3.960)

## 2015-10-18 MED ORDER — SODIUM CHLORIDE 0.9 % IV SOLN
Freq: Once | INTRAVENOUS | Status: AC
  Administered 2015-10-18: 14:00:00 via INTRAVENOUS

## 2015-10-18 MED ORDER — SODIUM CHLORIDE 0.9 % IJ SOLN
10.0000 mL | INTRAMUSCULAR | Status: DC | PRN
  Administered 2015-10-18: 10 mL via INTRAVENOUS
  Filled 2015-10-18: qty 10

## 2015-10-18 MED ORDER — HEPARIN SOD (PORK) LOCK FLUSH 100 UNIT/ML IV SOLN
500.0000 [IU] | Freq: Once | INTRAVENOUS | Status: AC | PRN
  Administered 2015-10-18: 500 [IU]
  Filled 2015-10-18: qty 5

## 2015-10-18 MED ORDER — SODIUM CHLORIDE 0.9 % IV SOLN
240.0000 mg | Freq: Once | INTRAVENOUS | Status: AC
  Administered 2015-10-18: 240 mg via INTRAVENOUS
  Filled 2015-10-18: qty 8

## 2015-10-18 MED ORDER — SODIUM CHLORIDE 0.9% FLUSH
10.0000 mL | INTRAVENOUS | Status: DC | PRN
  Administered 2015-10-18: 10 mL
  Filled 2015-10-18: qty 10

## 2015-10-18 NOTE — Progress Notes (Signed)
Called Dr. Alvy Bimler with potassium results. Instructed patient per Dr. Alvy Bimler, to take 2 potassium tablets a day and eat potassium rich food.

## 2015-10-18 NOTE — Patient Instructions (Addendum)
[facility] Discharge Instructions for Patients Receiving Chemotherapy  Today you received the following chemotherapy agents Nivolumab  To help prevent nausea and vomiting after your treatment, we encourage you to take your nausea medication   If you develop nausea and vomiting that is not controlled by your nausea medication, call the clinic.   BELOW ARE SYMPTOMS THAT SHOULD BE REPORTED IMMEDIATELY:  *FEVER GREATER THAN 100.5 F  *CHILLS WITH OR WITHOUT FEVER  NAUSEA AND VOMITING THAT IS NOT CONTROLLED WITH YOUR NAUSEA MEDICATION  *UNUSUAL SHORTNESS OF BREATH  *UNUSUAL BRUISING OR BLEEDING  TENDERNESS IN MOUTH AND THROAT WITH OR WITHOUT PRESENCE OF ULCERS  *URINARY PROBLEMS  *BOWEL PROBLEMS  UNUSUAL RASH Items with * indicate a potential emergency and should be followed up as soon as possible.  Feel free to call the clinic you have any questions or concerns. The clinic phone number is [phone].  Please show the [place] at check-in to the Emergency Department and triage nurse.  Nivolumab injection What is this medicine? NIVOLUMAB (nye VOL ue mab) is a monoclonal antibody. It is used to treat melanoma, lung cancer, kidney cancer, and Hodgkin lymphoma. This medicine may be used for other purposes; ask your health care provider or pharmacist if you have questions. What should I tell my health care provider before I take this medicine? They need to know if you have any of these conditions: -diabetes -immune system problems -kidney disease -liver disease -lung disease -organ transplant -stomach or intestine problems -thyroid disease -an unusual or allergic reaction to nivolumab, other medicines, foods, dyes, or preservatives -pregnant or trying to get pregnant -breast-feeding How should I use this medicine? This medicine is for infusion into a vein. It is given by a health care professional in a hospital or clinic setting. A special MedGuide  will be given to you before each treatment. Be sure to read this information carefully each time. Talk to your pediatrician regarding the use of this medicine in children. Special care may be needed. Overdosage: If you think you have taken too much of this medicine contact a poison control center or emergency room at once. NOTE: This medicine is only for you. Do not share this medicine with others. What if I miss a dose? It is important not to miss your dose. Call your doctor or health care professional if you are unable to keep an appointment. What may interact with this medicine? Interactions have not been studied. Give your health care provider a list of all the medicines, herbs, non-prescription drugs, or dietary supplements you use. Also tell them if you smoke, drink alcohol, or use illegal drugs. Some items may interact with your medicine. This list may not describe all possible interactions. Give your health care provider a list of all the medicines, herbs, non-prescription drugs, or dietary supplements you use. Also tell them if you smoke, drink alcohol, or use illegal drugs. Some items may interact with your medicine. What should I watch for while using this medicine? This drug may make you feel generally unwell. Continue your course of treatment even though you feel ill unless your doctor tells you to stop. You may need blood work done while you are taking this medicine. Do not become pregnant while taking this medicine or for 5 months after stopping it. Women should inform their doctor if they wish to become pregnant or think they might be pregnant. There is a potential for serious side effects to an unborn child. Talk  to your health care professional or pharmacist for more information. Do not breast-feed an infant while taking this medicine. What side effects may I notice from receiving this medicine? Side effects that you should report to your doctor or health care professional as soon as  possible: -allergic reactions like skin rash, itching or hives, swelling of the face, lips, or tongue -black, tarry stools -blood in the urine -bloody or watery diarrhea -changes in vision -change in sex drive -changes in emotions or moods -chest pain -confusion -cough -decreased appetite -diarrhea -facial flushing -feeling faint or lightheaded -fever, chills -hair loss -hallucination, loss of contact with reality -headache -irritable -joint pain -loss of memory -muscle pain -muscle weakness -seizures -shortness of breath -signs and symptoms of high blood sugar such as dizziness; dry mouth; dry skin; fruity breath; nausea; stomach pain; increased hunger or thirst; increased urination -signs and symptoms of kidney injury like trouble passing urine or change in the amount of urine -signs and symptoms of liver injury like dark yellow or brown urine; general ill feeling or flu-like symptoms; light-colored stools; loss of appetite; nausea; right upper belly pain; unusually weak or tired; yellowing of the eyes or skin -stiff neck -swelling of the ankles, feet, hands -weight gain Side effects that usually do not require medical attention (report to your doctor or health care professional if they continue or are bothersome): -bone pain -constipation -tiredness -vomiting This list may not describe all possible side effects. Call your doctor for medical advice about side effects. You may report side effects to FDA at 1-800-FDA-1088. Where should I keep my medicine? This drug is given in a hospital or clinic and will not be stored at home. NOTE: This sheet is a summary. It may not cover all possible information. If you have questions about this medicine, talk to your doctor, pharmacist, or health care provider.    2016, Elsevier/Gold Standard. (2014-10-03 10:03:42) Hypokalemia Hypokalemia means that the amount of potassium in the blood is lower than normal.Potassium is a chemical,  called an electrolyte, that helps regulate the amount of fluid in the body. It also stimulates muscle contraction and helps nerves function properly.Most of the body's potassium is inside of cells, and only a very small amount is in the blood. Because the amount in the blood is so small, minor changes can be life-threatening. CAUSES  Antibiotics.  Diarrhea or vomiting.  Using laxatives too much, which can cause diarrhea.  Chronic kidney disease.  Water pills (diuretics).  Eating disorders (bulimia).  Low magnesium level.  Sweating a lot. SIGNS AND SYMPTOMS  Weakness.  Constipation.  Fatigue.  Muscle cramps.  Mental confusion.  Skipped heartbeats or irregular heartbeat (palpitations).  Tingling or numbness. DIAGNOSIS  Your health care provider can diagnose hypokalemia with blood tests. In addition to checking your potassium level, your health care provider may also check other lab tests. TREATMENT Hypokalemia can be treated with potassium supplements taken by mouth or adjustments in your current medicines. If your potassium level is very low, you may need to get potassium through a vein (IV) and be monitored in the hospital. A diet high in potassium is also helpful. Foods high in potassium are:  Nuts, such as peanuts and pistachios.  Seeds, such as sunflower seeds and pumpkin seeds.  Peas, lentils, and lima beans.  Whole grain and bran cereals and breads.  Fresh fruit and vegetables, such as apricots, avocado, bananas, cantaloupe, kiwi, oranges, tomatoes, asparagus, and potatoes.  Orange and tomato juices.  Red meats.  Fruit yogurt. HOME CARE INSTRUCTIONS  Take all medicines as prescribed by your health care provider.  Maintain a healthy diet by including nutritious food, such as fruits, vegetables, nuts, whole grains, and lean meats.  If you are taking a laxative, be sure to follow the directions on the label. SEEK MEDICAL CARE IF: Potassium Content of  Foods Potassium is a mineral found in many foods and drinks. It helps keep fluids and minerals balanced in your body and affects how steadily your heart beats. Potassium also helps control your blood pressure and keep your muscles and nervous system healthy. Certain health conditions and medicines may change the balance of potassium in your body. When this happens, you can help balance your level of potassium through the foods that you do or do not eat. Your health care provider or dietitian may recommend an amount of potassium that you should have each day. The following lists of foods provide the amount of potassium (in parentheses) per serving in each item. HIGH IN POTASSIUM  The following foods and beverages have 200 mg or more of potassium per serving: Apricots, 2 raw or 5 dry (200 mg). Artichoke, 1 medium (345 mg). Avocado, raw,  each (245 mg). Banana, 1 medium (425 mg). Beans, lima, or baked beans, canned,  cup (280 mg). Beans, white, canned,  cup (595 mg). Beef roast, 3 oz (320 mg). Beef, ground, 3 oz (270 mg). Beets, raw or cooked,  cup (260 mg). Bran muffin, 2 oz (300 mg). Broccoli,  cup (230 mg). Brussels sprouts,  cup (250 mg). Cantaloupe,  cup (215 mg). Cereal, 100% bran,  cup (200-400 mg). Cheeseburger, single, fast food, 1 each (225-400 mg). Chicken, 3 oz (220 mg). Clams, canned, 3 oz (535 mg). Crab, 3 oz (225 mg). Dates, 5 each (270 mg). Dried beans and peas,  cup (300-475 mg). Figs, dried, 2 each (260 mg). Fish: halibut, tuna, cod, snapper, 3 oz (480 mg). Fish: salmon, haddock, swordfish, perch, 3 oz (300 mg). Fish, tuna, canned 3 oz (200 mg). [place] fries, fast food, 3 oz (470 mg). Granola with fruit and nuts,  cup (200 mg). Grapefruit juice,  cup (200 mg). Greens, beet,  cup (655 mg). Honeydew melon,  cup (200 mg). Kale, raw, 1 cup (300 mg). Kiwi, 1 medium (240 mg). Kohlrabi, rutabaga, parsnips,  cup (280 mg). Lentils,  cup (365 mg). Mango, 1  each (325 mg). Milk, chocolate, 1 cup (420 mg). Milk: nonfat, low-fat, whole, buttermilk, 1 cup (350-380 mg). Molasses, 1 Tbsp (295 mg). Mushrooms,  cup (280) mg. Nectarine, 1 each (275 mg). Nuts: almonds, peanuts, hazelnuts, [place], cashew, mixed, 1 oz (200 mg). Nuts, pistachios, 1 oz (295 mg). Orange, 1 each (240 mg). Orange juice,  cup (235 mg). Papaya, medium,  fruit (390 mg). Peanut butter, chunky, 2 Tbsp (240 mg). Peanut butter, smooth, 2 Tbsp (210 mg). Pear, 1 medium (200 mg). Pomegranate, 1 whole (400 mg). Pomegranate juice,  cup (215 mg). Pork, 3 oz (350 mg). Potato chips, salted, 1 oz (465 mg). Potato, baked with skin, 1 medium (925 mg). Potatoes, boiled,  cup (255 mg). Potatoes, mashed,  cup (330 mg). Prune juice,  cup (370 mg). Prunes, 5 each (305 mg). Pudding, chocolate,  cup (230 mg). Pumpkin, canned,  cup (250 mg). Raisins, seedless,  cup (270 mg). Seeds, sunflower or pumpkin, 1 oz (240 mg). Soy milk, 1 cup (300 mg). Spinach,  cup (420 mg). Spinach, canned,  cup (370 mg). Sweet potato, baked with skin, 1 medium (450  mg). Swiss chard,  cup (480 mg). Tomato or vegetable juice,  cup (275 mg). Tomato sauce or puree,  cup (400-550 mg). Tomato, raw, 1 medium (290 mg). Tomatoes, canned,  cup (200-300 mg). [place], 3 oz (250 mg). Wheat germ, 1 oz (250 mg). Winter squash,  cup (250 mg). Yogurt, plain or fruited, 6 oz (260-435 mg). Zucchini,  cup (220 mg). MODERATE IN POTASSIUM The following foods and beverages have 50-200 mg of potassium per serving: Apple, 1 each (150 mg). Apple juice,  cup (150 mg). Applesauce,  cup (90 mg). Apricot nectar,  cup (140 mg). Asparagus, small spears,  cup or 6 spears (155 mg). Bagel, cinnamon raisin, 1 each (130 mg). Bagel, egg or plain, 4 in., 1 each (70 mg). Beans, green,  cup (90 mg). Beans, yellow,  cup (190 mg). Beer, regular, 12 oz (100 mg). Beets, canned,  cup (125 mg). Blackberries,  cup (115  mg). Blueberries,  cup (60 mg). Bread, whole wheat, 1 slice (70 mg). Broccoli, raw,  cup (145 mg). Cabbage,  cup (150 mg). Carrots, cooked or raw,  cup (180 mg). Cauliflower, raw,  cup (150 mg). Celery, raw,  cup (155 mg). Cereal, bran flakes, cup (120-150 mg). Cheese, cottage,  cup (110 mg). Cherries, 10 each (150 mg). Chocolate, 1 oz bar (165 mg). Coffee, brewed 6 oz (90 mg). Corn,  cup or 1 ear (195 mg). Cucumbers,  cup (80 mg). Egg, large, 1 each (60 mg). Eggplant,  cup (60 mg). Endive, raw, cup (80 mg). English muffin, 1 each (65 mg). Fish, orange roughy, 3 oz (150 mg). Frankfurter, beef or pork, 1 each (75 mg). Fruit cocktail,  cup (115 mg). Grape juice,  cup (170 mg). Grapefruit,  fruit (175 mg). Grapes,  cup (155 mg). Greens: kale, turnip, collard,  cup (110-150 mg). Ice cream or frozen yogurt, chocolate,  cup (175 mg). Ice cream or frozen yogurt, vanilla,  cup (120-150 mg). Lemons, limes, 1 each (80 mg). Lettuce, all types, 1 cup (100 mg). Mixed vegetables,  cup (150 mg). Mushrooms, raw,  cup (110 mg). Nuts: walnuts, pecans, or macadamia, 1 oz (125 mg). Oatmeal,  cup (80 mg). Okra,  cup (110 mg). Onions, raw,  cup (120 mg). Peach, 1 each (185 mg). Peaches, canned,  cup (120 mg). Pears, canned,  cup (120 mg). Peas, green, frozen,  cup (90 mg). Peppers, green,  cup (130 mg). Peppers, red,  cup (160 mg). Pineapple juice,  cup (165 mg). Pineapple, fresh or canned,  cup (100 mg). Plums, 1 each (105 mg). Pudding, vanilla,  cup (150 mg). Raspberries,  cup (90 mg). Rhubarb,  cup (115 mg). Rice, wild,  cup (80 mg). Shrimp, 3 oz (155 mg). Spinach, raw, 1 cup (170 mg). Strawberries,  cup (125 mg). Summer squash  cup (175-200 mg). Swiss chard, raw, 1 cup (135 mg). Tangerines, 1 each (140 mg). Tea, brewed, 6 oz (65 mg). Turnips,  cup (140 mg). Watermelon,  cup (85 mg). Wine, red, table, 5 oz (180 mg). Wine, white, table, 5  oz (100 mg). LOW IN POTASSIUM The following foods and beverages have less than 50 mg of potassium per serving. Bread, white, 1 slice (30 mg). Carbonated beverages, 12 oz (less than 5 mg). Cheese, 1 oz (20-30 mg). Cranberries,  cup (45 mg). Cranberry juice cocktail,  cup (20 mg). Fats and oils, 1 Tbsp (less than 5 mg). Hummus, 1 Tbsp (32 mg). Nectar: papaya, mango, or pear,  cup (35 mg). Rice, white or brown,  cup (  50 mg). Spaghetti or macaroni,  cup cooked (30 mg). Tortilla, flour or corn, 1 each (50 mg). Waffle, 4 in., 1 each (50 mg). Water chestnuts,  cup (40 mg).   This information is not intended to replace advice given to you by your health care provider. Make sure you discuss any questions you have with your health care provider.   Document Released: 12/16/2004 Document Revised: 05/09/2013 Document Reviewed: 03/31/2013 Elsevier Interactive Patient Education [employer].  Your weakness gets worse.  You feel your heart pounding or racing.  You are vomiting or having diarrhea.  You are diabetic and having trouble keeping your blood glucose in the normal range. SEEK IMMEDIATE MEDICAL CARE IF:  You have chest pain, shortness of breath, or dizziness.  You are vomiting or having diarrhea for more than 2 days.  You faint. MAKE SURE YOU:   Understand these instructions.  Will watch your condition.  Will get help right away if you are not doing well or get worse.   This information is not intended to replace advice given to you by your health care provider. Make sure you discuss any questions you have with your health care provider.   Document Released: 05/04/2005 Document Revised: 05/25/2014 Document Reviewed: 11/04/2012 Elsevier Interactive Patient Education [employer].

## 2015-10-18 NOTE — Patient Instructions (Signed)

## 2015-10-21 ENCOUNTER — Other Ambulatory Visit: Payer: Self-pay | Admitting: Licensed Clinical Social Worker

## 2015-10-21 NOTE — Patient Outreach (Signed)
[facility]) Care Management  10/21/2015  CARLON DAVIDSON 1950/10/17 [id number]   Assessment- CSW completed outreach to patient. Patient answered. Patient reports that she is doing "so-so." CSW questioned if she has received rollator walker from [place] but she has declined. Patient reports that she "does not know what is going on with that. I get so frustrated with all of this." CSW will update RNCM. Patient shares that she is using her previous class mate's rollator walker at this time. CSW questioned if she contacted resources provided in terms of shower chair and elevated toilet seat. She states no. CSW encouraged patient to look at all resources. CSW will mail her information on [employer] [place] so that she can call about equipment. Patient is agreeable to do this. Patient reports that she is using her sister's shower chair for the time being. She also reports that maintenance at there residence has installed shower nozzle and it is working effectively for her.   Patient reports that her left foot is still swelling and has extended into her leg (not reached the calf yet.) She reports that her gabapentin is assisting her with the pain. She shares that she is elevating foot all night. CSW reminded patient of upcoming appointments and informed her that she will be out of the office next week. Patient is agreeable to next call in two weeks.   Plan-CSW will mail out resources to patient and will sent in basket request to [place] Management Assistant. CSW will also route encounter to [facility].  [name], BSW, MSW, [email or web address] Phone: [phone] Fax: [phone]

## 2015-10-22 ENCOUNTER — Ambulatory Visit
Admission: RE | Admit: 2015-10-22 | Discharge: 2015-10-22 | Disposition: A | Discharge status: Home / Self Care | Payer: Commercial Managed Care - HMO | Source: Ambulatory Visit

## 2015-10-22 ENCOUNTER — Other Ambulatory Visit: Payer: Self-pay | Admitting: Family Medicine

## 2015-10-22 DIAGNOSIS — Z1231 Encounter for screening mammogram for malignant neoplasm of breast: Secondary | ICD-10-CM | POA: Diagnosis not present

## 2015-10-23 NOTE — Patient Outreach (Signed)
[name]) Care Management  10/23/2015  Lauren [name] December 30, 1950 [id number]   Request received from [name], LCSW to mail patient community resource information. Resources mailed today, 10/23/15.   [name]  [facility] Care Management Assistant

## 2015-10-24 ENCOUNTER — Other Ambulatory Visit: Payer: Self-pay | Admitting: Family Medicine

## 2015-10-24 DIAGNOSIS — R928 Other abnormal and inconclusive findings on diagnostic imaging of breast: Secondary | ICD-10-CM

## 2015-10-30 ENCOUNTER — Other Ambulatory Visit (HOSPITAL_BASED_OUTPATIENT_CLINIC_OR_DEPARTMENT_OTHER): Payer: Commercial Managed Care - HMO

## 2015-10-30 ENCOUNTER — Encounter: Payer: Self-pay | Admitting: Hematology and Oncology

## 2015-10-30 ENCOUNTER — Ambulatory Visit: Payer: Commercial Managed Care - HMO

## 2015-10-30 ENCOUNTER — Ambulatory Visit (HOSPITAL_BASED_OUTPATIENT_CLINIC_OR_DEPARTMENT_OTHER): Payer: Commercial Managed Care - HMO

## 2015-10-30 ENCOUNTER — Telehealth: Payer: Self-pay | Admitting: Hematology and Oncology

## 2015-10-30 ENCOUNTER — Ambulatory Visit (HOSPITAL_BASED_OUTPATIENT_CLINIC_OR_DEPARTMENT_OTHER): Payer: Commercial Managed Care - HMO | Admitting: Hematology and Oncology

## 2015-10-30 VITALS — BP 98/50 | HR 80 | Temp 98.1°F | Resp 18 | Ht 64.0 in | Wt 167.0 lb

## 2015-10-30 DIAGNOSIS — R600 Localized edema: Secondary | ICD-10-CM

## 2015-10-30 DIAGNOSIS — R21 Rash and other nonspecific skin eruption: Secondary | ICD-10-CM

## 2015-10-30 DIAGNOSIS — F17200 Nicotine dependence, unspecified, uncomplicated: Secondary | ICD-10-CM

## 2015-10-30 DIAGNOSIS — Z79899 Other long term (current) drug therapy: Secondary | ICD-10-CM

## 2015-10-30 DIAGNOSIS — C7931 Secondary malignant neoplasm of brain: Secondary | ICD-10-CM

## 2015-10-30 DIAGNOSIS — C3412 Malignant neoplasm of upper lobe, left bronchus or lung: Secondary | ICD-10-CM

## 2015-10-30 DIAGNOSIS — R42 Dizziness and giddiness: Secondary | ICD-10-CM | POA: Diagnosis not present

## 2015-10-30 DIAGNOSIS — Z5181 Encounter for therapeutic drug level monitoring: Secondary | ICD-10-CM

## 2015-10-30 DIAGNOSIS — Z5111 Encounter for antineoplastic chemotherapy: Secondary | ICD-10-CM

## 2015-10-30 DIAGNOSIS — C7949 Secondary malignant neoplasm of other parts of nervous system: Secondary | ICD-10-CM

## 2015-10-30 DIAGNOSIS — Z5112 Encounter for antineoplastic immunotherapy: Secondary | ICD-10-CM

## 2015-10-30 LAB — CBC WITH DIFFERENTIAL/PLATELET
BASO%: 0.4 % (ref 0.0–2.0)
BASOS ABS: 0 10*3/uL (ref 0.0–0.1)
EOS ABS: 0.1 10*3/uL (ref 0.0–0.5)
EOS%: 1.4 % (ref 0.0–7.0)
HCT: 38.8 % (ref 34.8–46.6)
HEMOGLOBIN: 12.1 g/dL (ref 11.6–15.9)
LYMPH%: 16.3 % (ref 14.0–49.7)
MCH: 25.2 pg (ref 25.1–34.0)
MCHC: 31.2 g/dL — ABNORMAL LOW (ref 31.5–36.0)
MCV: 80.7 fL (ref 79.5–101.0)
MONO#: 0.5 10*3/uL (ref 0.1–0.9)
MONO%: 8.7 % (ref 0.0–14.0)
NEUT#: 4.1 10*3/uL (ref 1.5–6.5)
NEUT%: 73.2 % (ref 38.4–76.8)
PLATELETS: 197 10*3/uL (ref 145–400)
RBC: 4.81 10*6/uL (ref 3.70–5.45)
RDW: 16 % — AB (ref 11.2–14.5)
WBC: 5.7 10*3/uL (ref 3.9–10.3)
lymph#: 0.9 10*3/uL (ref 0.9–3.3)

## 2015-10-30 LAB — COMPREHENSIVE METABOLIC PANEL
ALBUMIN: 3 g/dL — AB (ref 3.5–5.0)
ALK PHOS: 135 U/L (ref 40–150)
ALT: <9 U/L (ref 0–55)
AST: 12 U/L (ref 5–34)
Anion Gap: 8 mEq/L (ref 3–11)
BILIRUBIN TOTAL: 0.47 mg/dL (ref 0.20–1.20)
BUN: 17.1 mg/dL (ref 7.0–26.0)
CO2: 26 mEq/L (ref 22–29)
Calcium: 9 mg/dL (ref 8.4–10.4)
Chloride: 106 mEq/L (ref 98–109)
Creatinine: 1.4 mg/dL — ABNORMAL HIGH (ref 0.6–1.1)
EGFR: 45 mL/min/{1.73_m2} — AB (ref >90)
GLUCOSE: 101 mg/dL (ref 70–140)
POTASSIUM: 3.7 meq/L (ref 3.5–5.1)
SODIUM: 140 meq/L (ref 136–145)
Total Protein: 7.4 g/dL (ref 6.4–8.3)

## 2015-10-30 LAB — TSH: TSH: 1.264 m(IU)/L (ref 0.308–3.960)

## 2015-10-30 MED ORDER — HYDROCODONE-HOMATROPINE 5-1.5 MG/5ML PO SYRP: 5.0000 mL | ORAL_SOLUTION | Freq: Four times a day (QID) | ORAL | Status: DC | PRN

## 2015-10-30 MED ORDER — SODIUM CHLORIDE 0.9% FLUSH
10.0000 mL | INTRAVENOUS | Status: DC | PRN
  Administered 2015-10-30: 10 mL
  Filled 2015-10-30: qty 10

## 2015-10-30 MED ORDER — SODIUM CHLORIDE 0.9 % IJ SOLN
10.0000 mL | INTRAMUSCULAR | Status: DC | PRN
  Administered 2015-10-30: 10 mL via INTRAVENOUS
  Filled 2015-10-30: qty 10

## 2015-10-30 MED ORDER — SODIUM CHLORIDE 0.9 % IV SOLN
Freq: Once | INTRAVENOUS | Status: AC
  Administered 2015-10-30: 11:00:00 via INTRAVENOUS

## 2015-10-30 MED ORDER — NIVOLUMAB CHEMO INJECTION 100 MG/10ML
240.0000 mg | Freq: Once | INTRAVENOUS | Status: AC
  Administered 2015-10-30: 240 mg via INTRAVENOUS
  Filled 2015-10-30: qty 8

## 2015-10-30 MED ORDER — HEPARIN SOD (PORK) LOCK FLUSH 100 UNIT/ML IV SOLN
500.0000 [IU] | Freq: Once | INTRAVENOUS | Status: AC | PRN
  Administered 2015-10-30: 500 [IU]
  Filled 2015-10-30: qty 5

## 2015-10-30 MED FILL — HYDROCODONE-HOMATROPINE SYR: 5-1.5 | 12 days supply | Qty: 240 | Fill #0

## 2015-10-30 NOTE — Progress Notes (Signed)
[facility] OFFICE PROGRESS NOTE  Patient Care Team: [name], MD as PCP - General (Family Medicine) Gaye Pollack, MD (Cardiothoracic Surgery) Thea Silversmith, MD (Radiation Oncology) Dickie La, MD (Family Medicine) Heath Lark, MD as Consulting Physician (Hematology and Oncology) Renella Cunas, MD as Consulting Physician (Cardiology) Greg Cutter, LCSW as [place] [facility] (Licensed Clinical Social Worker) Valente David, RN as [place] Management  SUMMARY OF ONCOLOGIC HISTORY: Oncology History   Colon cancer   Primary site: Colon and Rectum (Left)   Staging method: AJCC 7th Edition   Clinical: Stage I (T2, N0, M0) signed by Heath Lark, MD on 05/31/2013  2:39 PM   Pathologic: Stage I (T2, N0, cM0) signed by Heath Lark, MD on 05/31/2013  2:39 PM   Summary: Stage I (T2, N0, cM0) Lung cancer, EGFR/ALK negative, recurrence after initial resection to LN and brain   Primary site: Lung (Left)   Staging method: AJCC 7th Edition   Clinical: Stage IV (T1, N2, M1b) signed by Heath Lark, MD on 05/31/2013  2:26 PM   Pathologic: Stage IV (T1, N2, M1b) signed by Heath Lark, MD on 05/31/2013  2:26 PM   Summary: Stage IV (T1, N2, M1b)       Cancer of upper lobe of left lung, Adenocarcinoma   03/01/2007 Procedure Colonoscopy revealed abnormalities and biopsy show high-grade dysplasia   04/01/2007 Surgery She underwent sigmoid resection which showed T2 N0 colon cancer, and negative margins and all of 17 lymph nodes were negative   02/29/2008 Procedure Repeat surveillance colonoscopy was negative.   06/16/2010 Surgery She underwent left upper lobectomy we show well-differentiated adenocarcinoma of the lung, T1, N0, M0   03/18/2011 Procedure Repeat colonoscopy show multiple polyps but there were benign   10/19/2011 Procedure Biopsy of mediastinal lymph node came back positive for recurrence of lung cancer, EGFR and ALK negative   11/16/2011  - 12/14/2011 Chemotherapy She received concurrent chemoradiation therapy with weekly carboplatin and Taxol.   11/16/2011 - 12/29/2011 Radiation Therapy She received radiation therapy with weekly chemotherapy   02/15/2012 Imaging MR of the brain showed a new intracranial metastases. This was subsequently treated with stereotactic radiosurgery.   03/07/2012 - 04/12/2013 Chemotherapy She received chemotherapy with maintainence Alimta every 3 weeks. Chemotherapy was discontinued due to profound fatigue   03/02/2013 Procedure She had therapeutic ultrasound guidance thoracentesis for pleural effusion that came back negative for cancer   05/04/2013 Procedure She had repeat ultrasound-guided thoracentesis again and cytology was negative   05/29/2013 Imaging Repeat CT scan of the chest, abdomen and pelvis show no evidence of disease but persistent right-sided pleural effusion   06/02/2013 Surgery The patient had placement of Pleurx catheter and subsequently underwent pleurodesis.   09/22/2013 Imaging Repeat CT scan show no evidence of active disease. There are nonspecific lymphadenopathy and she is placed on observation.   03/23/2014 Imaging Repeat CT scan of the chest, abdomen and pelvis show recurrence of cancer with widespread bilateral pulmonary metastasis.   05/14/2014 Imaging Imaging of the chest and brain were repeated due to delay of initiation of chemotherapy. Overall, chest CT scan show stable disease. MRI of the head was negative for recurrence   05/16/2014 - 07/18/2014 Chemotherapy  she completed 4 cycles of combination chemotherapy with carboplatin and Alimta   07/16/2014 Imaging  repeat CT scan of the chest, abdomen and pelvis show regression in the size of lung nodules.   08/29/2014 - 08/14/2015 Chemotherapy She  received maintenance Alimta   10/16/2014 Imaging  repeat CT scan of the chest, abdomen and pelvis show regression in the size of lung nodules.   01/29/2015 Imaging Repeat CT scan showed stable disease    02/01/2015 Imaging MRI brain is negative   04/30/2015 Imaging CT scan of the chest, abdomen and pelvis showed stable disease   07/25/2015 Imaging MRI brain showed no evidence of new disease   09/09/2015 Imaging CT chest showed interval increase in size of large RIGHT upper lobe nodule is most consistent with lung cancer recurrence.   09/18/2015 -  Chemotherapy She started on Nivolumab    Brain metastases treated with radiosurgery   02/26/2012 Initial Diagnosis Brain metastases treated with radiosurgery    INTERVAL HISTORY: Please see below for problem oriented charting. She returns for further follow-up, before chemotherapy. She complained of some postural dizziness and is noted to have hypotension. Her leg swelling did not improve on Lasix. Her skin rash has improved with topical steroid cream. She has occasional productive cough and continues to smoke. No fevers or chills.  REVIEW OF SYSTEMS:   Constitutional: Denies fevers, chills or abnormal weight loss Eyes: Denies blurriness of vision Ears, nose, mouth, throat, and face: Denies mucositis or sore throat Cardiovascular: Denies palpitation, chest discomfort  Gastrointestinal:  Denies nausea, heartburn or change in bowel habits Lymphatics: Denies new lymphadenopathy or easy bruising Neurological:Denies numbness, tingling Behavioral/Psych: Mood is stable, no new changes  All other systems were reviewed with the patient and are negative.  I have reviewed the past medical history, past surgical history, social history and family history with the patient and they are unchanged from previous note.  ALLERGIES:  is allergic to adhesive and lisinopril.  MEDICATIONS:  Current Outpatient Prescriptions  Medication Sig Dispense Refill  . albuterol (PROVENTIL HFA;VENTOLIN HFA) 108 (90 BASE) MCG/ACT inhaler Inhale 1 puff into the lungs 2 (two) times daily. (Patient taking differently: Inhale 2 puffs into the lungs every 6 (six) hours as needed. )  6.7 g prn  . aspirin 81 MG chewable tablet Chew 81 mg by mouth daily.    Marland Kitchen CAPSAICIN EX Apply topically as needed. To feet    . cholecalciferol (VITAMIN D) 1000 units tablet Take 1,000 Units by mouth daily.    . clonazePAM (KLONOPIN) 0.5 MG tablet Take 0.5 mg by mouth 2 (two) times daily as needed. Reported on 08/28/2015    . diclofenac sodium (VOLTAREN) 1 % GEL Apply 2 g topically 4 (four) times daily. As needed for pain 100 g 3  . FLUoxetine (PROZAC) 20 MG tablet TAKE 1 TABLET DAILY 90 tablet 0  . gabapentin (NEURONTIN) 300 MG capsule Take 1 capsule (300 mg total) by mouth 3 (three) times daily. 90 capsule 1  . HYDROcodone-homatropine (HYCODAN) 5-1.5 MG/5ML syrup Take 5 mLs by mouth every 6 (six) hours as needed for cough. 240 mL 0  . lidocaine-prilocaine (EMLA) cream Apply topically as needed. Apply to porta cath site one hour prior to needle stick. 30 g 6  . nebivolol (BYSTOLIC) 10 MG tablet Take 1 tablet (10 mg total) by mouth daily. 90 tablet 4  . nitroGLYCERIN (NITROSTAT) 0.4 MG SL tablet Place 1 tablet (0.4 mg total) under the tongue every 5 (five) minutes as needed. For chest pain 25 tablet 5  . omeprazole (PRILOSEC) 40 MG capsule TAKE 1 CAPSULE (40 MG TOTAL) BY MOUTH DAILY. 90 capsule 0  . potassium chloride SA (K-DUR,KLOR-CON) 20 MEQ tablet TAKE 1 TABLET TWICE DAILY 180 tablet  0  . pravastatin (PRAVACHOL) 40 MG tablet TAKE 1 TABLET BY MOUTH ONCE DAILY 90 tablet 3  . tiZANidine (ZANAFLEX) 2 MG tablet Take 1 tablet (2 mg total) by mouth every 6 (six) hours as needed (Back Muscle Spasm). 30 tablet 3  . traMADol (ULTRAM) 50 MG tablet Take 1 tablet (50 mg total) by mouth every 6 (six) hours as needed for moderate pain or severe pain. 90 tablet 1  . triamcinolone ointment (KENALOG) 0.5 % Apply 1 application topically 2 (two) times daily. 30 g 0  . umeclidinium-vilanterol (ANORO ELLIPTA) 62.5-25 MCG/INH AEPB Inhale 1 puff into the lungs daily.    . [DISCONTINUED] buPROPion (WELLBUTRIN XL) 150  MG 24 hr tablet Take 1 tablet (150 mg total) by mouth 2 (two) times daily. 60 tablet 5   No current facility-administered medications for this visit.   Facility-Administered Medications Ordered in Other Visits  Medication Dose Route Frequency Provider Last Rate Last Dose  . heparin lock flush 100 unit/mL  500 Units Intracatheter Once PRN Heath Lark, MD      . sodium chloride 0.9 % injection 10 mL  10 mL Intravenous PRN Heath Lark, MD   10 mL at 09/18/15 1345  . sodium chloride flush (NS) 0.9 % injection 10 mL  10 mL Intracatheter PRN Heath Lark, MD        PHYSICAL EXAMINATION: ECOG PERFORMANCE STATUS: 1 - Symptomatic but completely ambulatory  Filed Vitals:   10/30/15 1015  BP: 98/50  Pulse: 80  Temp: 98.1 F (36.7 C)  Resp: 18   Filed Weights   10/30/15 1015  Weight: 167 lb (75.751 kg)    GENERAL:alert, no distress and comfortable SKIN: She has persistent bilateral skin lesions on her hands and feet, improved compared to prior visit EYES: normal, Conjunctiva are pink and non-injected, sclera clear OROPHARYNX:no exudate, no erythema and lips, buccal mucosa, and tongue normal  NECK: supple, thyroid normal size, non-tender, without nodularity LYMPH:  no palpable lymphadenopathy in the cervical, axillary or inguinal LUNGS: clear to auscultation and percussion with normal breathing effort HEART: regular rate & rhythm and no murmurs With moderate bilateral lower extremity edema ABDOMEN:abdomen soft, non-tender and normal bowel sounds Musculoskeletal:no cyanosis of digits and no clubbing  NEURO: alert & oriented x 3 with fluent speech, no focal motor/sensory deficits  LABORATORY DATA:  I have reviewed the data as listed    Component Value Date/Time   NA 140 10/30/2015 0924   NA 141 12/26/2013 0946   NA 143 09/28/2011 0833   K 3.7 10/30/2015 0924   K 4.0 12/26/2013 0946   K 3.5 09/28/2011 0833   CL 106 12/26/2013 0946   CL 102 10/24/2012 1001   CL 98 09/28/2011 0833   CO2  26 10/30/2015 0924   CO2 27 12/26/2013 0946   CO2 30 09/28/2011 0833   GLUCOSE 101 10/30/2015 0924   GLUCOSE 101* 12/26/2013 0946   GLUCOSE 111* 10/24/2012 1001   GLUCOSE 112 09/28/2011 0833   BUN 17.1 10/30/2015 0924   BUN 9 12/26/2013 0946   BUN 9 09/28/2011 0833   CREATININE 1.4* 10/30/2015 0924   CREATININE 0.83 12/26/2013 0946   CREATININE 0.7 09/28/2011 0833   CALCIUM 9.0 10/30/2015 0924   CALCIUM 8.8 12/26/2013 0946   CALCIUM 8.7 09/28/2011 0833   PROT 7.4 10/30/2015 0924   PROT 6.8 12/26/2013 0946   PROT 7.2 09/28/2011 0833   ALBUMIN 3.0* 10/30/2015 0924   ALBUMIN 3.8 12/26/2013 0946   ALBUMIN 3.2*  09/28/2011 0833   AST 12 10/30/2015 0924   AST 12 12/26/2013 0946   AST 15 09/28/2011 0833   ALT <9 10/30/2015 0924   ALT <8 12/26/2013 0946   ALT 16 09/28/2011 0833   ALKPHOS 135 10/30/2015 0924   ALKPHOS 148* 12/26/2013 0946   ALKPHOS 99* 09/28/2011 0833   BILITOT 0.47 10/30/2015 0924   BILITOT 0.6 12/26/2013 0946   BILITOT 0.80 09/28/2011 0833   GFRNONAA 74* 06/12/2013 0625   GFRAA 86* 06/12/2013 0625    No results found for: SPEP, UPEP  Lab Results  Component Value Date   WBC 5.7 10/30/2015   NEUTROABS 4.1 10/30/2015   HGB 12.1 10/30/2015   HCT 38.8 10/30/2015   MCV 80.7 10/30/2015   PLT 197 10/30/2015      Chemistry      Component Value Date/Time   NA 140 10/30/2015 0924   NA 141 12/26/2013 0946   NA 143 09/28/2011 0833   K 3.7 10/30/2015 0924   K 4.0 12/26/2013 0946   K 3.5 09/28/2011 0833   CL 106 12/26/2013 0946   CL 102 10/24/2012 1001   CL 98 09/28/2011 0833   CO2 26 10/30/2015 0924   CO2 27 12/26/2013 0946   CO2 30 09/28/2011 0833   BUN 17.1 10/30/2015 0924   BUN 9 12/26/2013 0946   BUN 9 09/28/2011 0833   CREATININE 1.4* 10/30/2015 0924   CREATININE 0.83 12/26/2013 0946   CREATININE 0.7 09/28/2011 0833      Component Value Date/Time   CALCIUM 9.0 10/30/2015 0924   CALCIUM 8.8 12/26/2013 0946   CALCIUM 8.7 09/28/2011 0833    ALKPHOS 135 10/30/2015 0924   ALKPHOS 148* 12/26/2013 0946   ALKPHOS 99* 09/28/2011 0833   AST 12 10/30/2015 0924   AST 12 12/26/2013 0946   AST 15 09/28/2011 0833   ALT <9 10/30/2015 0924   ALT <8 12/26/2013 0946   ALT 16 09/28/2011 0833   BILITOT 0.47 10/30/2015 0924   BILITOT 0.6 12/26/2013 0946   BILITOT 0.80 09/28/2011 0833      ASSESSMENT & PLAN:  Cancer of upper lobe of left lung, Adenocarcinoma Unfortunately, recent blood test with third-generation sequencing show no specific treatment available for mutation detected. She was started on first dose Nivolumab 2 weeks ago and tolerated that very well. I will continue treatment without dose adjustment. Plan to restage at the end of the month to assess response to treatment  TOBACCO ABUSE I spent some time counseling the patient the importance of tobacco cessation. She is currently not interested to quit now.    Skin rash It is very particular as it only affects her hands and her feet. She is quite asymptomatic. It has improved since I put her on topical cream. I recommend topical triamcinolone cream for another 7 days.    Bilateral leg edema She has bilateral lower extremity edema, left greater than the right. This is likely related to low albumin with poor circulation. She had colon cancer and some of the lymph nodes were removed. Lasix is not helping, rightfully so since this is not congestive heart failure. The Lasix has caused significant hypotension and dizziness. I told her to discontinue Lasix permanently.  Postural dizziness This is due to undesirable side effects from Lasix therapy. I will give her a liter of IV fluids and told her to stop Lasix permanently.   Orders Placed This Encounter  Procedures  . CT Chest W Contrast    Standing Status:  Future     Number of Occurrences:      Standing Expiration Date: 12/29/2016    Order Specific Question:  Reason for Exam (SYMPTOM  OR DIAGNOSIS REQUIRED)     Answer:  lung ca, assess response to Rx    Order Specific Question:  Preferred imaging location?    Answer:  [facility]   All questions were answered. The patient knows to call the clinic with any problems, questions or concerns. No barriers to learning was detected. I spent 25 minutes counseling the patient face to face. The total time spent in the appointment was 30 minutes and more than 50% was on counseling and review of test results     [facility], [place], MD 10/30/2015 12:29 PM

## 2015-10-30 NOTE — Patient Instructions (Signed)

## 2015-10-30 NOTE — Telephone Encounter (Signed)
Gave and printed appt sched and avs for pt for JUNE °

## 2015-10-30 NOTE — Assessment & Plan Note (Signed)
It is very particular as it only affects her hands and her feet. She is quite asymptomatic. It has improved since I put her on topical cream. I recommend topical triamcinolone cream for another 7 days.

## 2015-10-30 NOTE — Assessment & Plan Note (Addendum)
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 She was started on first dose Nivolumab 2 weeks ago and tolerated that very well. I will continue treatment without dose adjustment. Plan to restage at the end of the month to assess response to treatment

## 2015-10-30 NOTE — Assessment & Plan Note (Signed)
I spent some time counseling the patient the importance of tobacco cessation. She is currently not interested to quit now. 

## 2015-10-30 NOTE — Assessment & Plan Note (Signed)
This is due to undesirable side effects from Lasix therapy. I will give her a liter of IV fluids and told her to stop Lasix permanently.

## 2015-10-30 NOTE — Assessment & Plan Note (Signed)
She has bilateral lower extremity edema, left greater than the right. This is likely related to low albumin with poor circulation. She had colon cancer and some of the lymph nodes were removed. Lasix is not helping, rightfully so since this is not congestive heart failure. The Lasix has caused significant hypotension and dizziness. I told her to discontinue Lasix permanently.

## 2015-10-30 NOTE — Patient Instructions (Signed)
[facility] Discharge Instructions for Patients Receiving Chemotherapy  Today you received the following chemotherapy agents Nivolumab  To help prevent nausea and vomiting after your treatment, we encourage you to take your nausea medication   If you develop nausea and vomiting that is not controlled by your nausea medication, call the clinic.   BELOW ARE SYMPTOMS THAT SHOULD BE REPORTED IMMEDIATELY:  *FEVER GREATER THAN 100.5 F  *CHILLS WITH OR WITHOUT FEVER  NAUSEA AND VOMITING THAT IS NOT CONTROLLED WITH YOUR NAUSEA MEDICATION  *UNUSUAL SHORTNESS OF BREATH  *UNUSUAL BRUISING OR BLEEDING  TENDERNESS IN MOUTH AND THROAT WITH OR WITHOUT PRESENCE OF ULCERS  *URINARY PROBLEMS  *BOWEL PROBLEMS  UNUSUAL RASH Items with * indicate a potential emergency and should be followed up as soon as possible.  Feel free to call the clinic you have any questions or concerns. The clinic phone number is [phone].  Please show the [place] at check-in to the Emergency Department and triage nurse.  Dehydration, Adult Dehydration is a condition in which you do not have enough fluid or water in your body. It happens when you take in less fluid than you lose. Vital organs such as the kidneys, brain, and heart cannot function without a proper amount of fluids. Any loss of fluids from the body can cause dehydration.  Dehydration can range from mild to severe. This condition should be treated right away to help prevent it from becoming severe. CAUSES  This condition may be caused by:  Vomiting.  Diarrhea.  Excessive sweating, such as when exercising in hot or humid weather.  Not drinking enough fluid during strenuous exercise or during an illness.  Excessive urine output.  Fever.  Certain medicines. RISK FACTORS This condition is more likely to develop in:  People who are taking certain medicines that cause the body to lose excess fluid (diuretics).    People who have a chronic illness, such as diabetes, that may increase urination.  Older adults.   People who live at high altitudes.   People who participate in endurance sports.  SYMPTOMS  Mild Dehydration  Thirst.  Dry lips.  Slightly dry mouth.  Dry, warm skin. Moderate Dehydration  Very dry mouth.   Muscle cramps.   Dark urine and decreased urine production.   Decreased tear production.   Headache.   Light-headedness, especially when you stand up from a sitting position.  Severe Dehydration  Changes in skin.   Cold and clammy skin.   Skin does not spring back quickly when lightly pinched and released.   Changes in body fluids.   Extreme thirst.   No tears.   Not able to sweat when body temperature is high, such as in hot weather.   Minimal urine production.   Changes in vital signs.   Rapid, weak pulse (more than 100 beats per minute when you are sitting still).   Rapid breathing.   Low blood pressure.   Other changes.   Sunken eyes.   Cold hands and feet.   Confusion.  Lethargy and difficulty being awakened.  Fainting (syncope).   Short-term weight loss.   Unconsciousness. DIAGNOSIS  This condition may be diagnosed based on your symptoms. You may also have tests to determine how severe your dehydration is. These tests may include:   Urine tests.   Blood tests.  TREATMENT  Treatment for this condition depends on the severity. Mild or moderate dehydration can often be treated at home. Treatment should be  started right away. Do not wait until dehydration becomes severe. Severe dehydration needs to be treated at the hospital. Treatment for Mild Dehydration  Drinking plenty of water to replace the fluid you have lost.   Replacing minerals in your blood (electrolytes) that you may have lost.  Treatment for Moderate Dehydration  Consuming oral rehydration solution (ORS). Treatment for Severe  Dehydration  Receiving fluid through an IV tube.   Receiving electrolyte solution through a feeding tube that is passed through your nose and into your stomach (nasogastric tube or NG tube).  Correcting any abnormalities in electrolytes. HOME CARE INSTRUCTIONS   Drink enough fluid to keep your urine clear or pale yellow.   Drink water or fluid slowly by taking small sips. You can also try sucking on ice cubes.  Have food or beverages that contain electrolytes. Examples include bananas and sports drinks.  Take over-the-counter and prescription medicines only as told by your health care provider.   Prepare ORS according to the manufacturer's instructions. Take sips of ORS every 5 minutes until your urine returns to normal.  If you have vomiting or diarrhea, continue to try to drink water, ORS, or both.   If you have diarrhea, avoid:   Beverages that contain caffeine.   Fruit juice.   Milk.   Carbonated soft drinks.  Do not take salt tablets. This can lead to the condition of having too much sodium in your body (hypernatremia).  SEEK MEDICAL CARE IF:  You cannot eat or drink without vomiting.  You have had moderate diarrhea during a period of more than 24 hours.  You have a fever. SEEK IMMEDIATE MEDICAL CARE IF:   You have extreme thirst.  You have severe diarrhea.  You have not urinated in 6-8 hours, or you have urinated only a small amount of very dark urine.  You have shriveled skin.  You are dizzy, confused, or both.   This information is not intended to replace advice given to you by your health care provider. Make sure you discuss any questions you have with your health care provider.   Document Released: 05/04/2005 Document Revised: 01/23/2015 Document Reviewed: 09/19/2014 Elsevier Interactive Patient Education [employer].

## 2015-10-31 ENCOUNTER — Ambulatory Visit
Admission: RE | Admit: 2015-10-31 | Discharge: 2015-10-31 | Disposition: A | Discharge status: Home / Self Care | Payer: Commercial Managed Care - HMO | Source: Ambulatory Visit | Attending: Family Medicine | Admitting: Family Medicine

## 2015-10-31 ENCOUNTER — Other Ambulatory Visit: Payer: Self-pay | Admitting: Radiation Therapy

## 2015-10-31 ENCOUNTER — Other Ambulatory Visit: Payer: Self-pay | Admitting: *Deleted

## 2015-10-31 DIAGNOSIS — R928 Other abnormal and inconclusive findings on diagnostic imaging of breast: Secondary | ICD-10-CM

## 2015-10-31 DIAGNOSIS — C7931 Secondary malignant neoplasm of brain: Secondary | ICD-10-CM

## 2015-10-31 DIAGNOSIS — R921 Mammographic calcification found on diagnostic imaging of breast: Secondary | ICD-10-CM | POA: Diagnosis not present

## 2015-10-31 DIAGNOSIS — C7949 Secondary malignant neoplasm of other parts of nervous system: Principal | ICD-10-CM

## 2015-10-31 NOTE — Patient Outreach (Signed)
Call placed to member to follow up on obtaining medical supplies.  No answer, HIPPA compliant voice message left.  Will await call back.  If no call back, will follow up within 2 weeks.  Valente David, [place], MSN [place] [phone]

## 2015-11-01 DIAGNOSIS — R928 Other abnormal and inconclusive findings on diagnostic imaging of breast: Secondary | ICD-10-CM | POA: Insufficient documentation

## 2015-11-04 ENCOUNTER — Other Ambulatory Visit: Payer: Self-pay | Admitting: Licensed Clinical Social Worker

## 2015-11-04 NOTE — Patient Outreach (Signed)
[name]) Care Management  11/04/2015  Lauren [name] 02/21/1951 [id number]   Assessment-CSW completed outreach attempt today. CSW unable to reach patient successfully. CSW left a HIPPA compliant voice message encouraging patient to return call once available.  Plan-CSW will await return call or complete an additional outreach if needed.  [name], BSW, MSW, [email or web address] Phone: [phone] Fax: [phone]

## 2015-11-11 ENCOUNTER — Telehealth: Payer: Self-pay | Admitting: *Deleted

## 2015-11-11 ENCOUNTER — Other Ambulatory Visit: Payer: Self-pay | Admitting: Licensed Clinical Social Worker

## 2015-11-11 NOTE — Telephone Encounter (Signed)
Lynette with the [facility] calling, needs [facility] referral placed for patient with appointment tomorrow. Will be seeing Dr. Alvy Bimler, dx. Code C34.12

## 2015-11-11 NOTE — Telephone Encounter (Signed)
Done

## 2015-11-11 NOTE — Patient Outreach (Signed)
[facility]) Care Management  11/11/2015  KAMIL HANIGAN [birth year] [id number]   Assessment- CSW completed outreach to patient and patient answered. Patient questioned if there were an updates on gaining needed medical equipment. Patient states "There are not updates. I got so frustrated with everything and I don't want to deal with it right now. I got my walker and shower chair that I am borrowing." Patient was encouraged to still follow up with insurance to see if she can gain the medical equipment. Patient reports that her hands and feet continue to peel and she was prescribed a cream but it is not working but she has upcoming appointment to discuss this. She reports that she is no longer taking a fluid pill but that her legs are not as swollen as they have been in the past. Patient reports that she had a mammogram and they found four spots that looked concerning but that they were found not to be cancerous. Next follow up is in six months. CSW discussed community resources with patient. Patient has not used [facility] and will not use service unless needed. Patient denies any depression or sadness but reports having concerns about her youngest son and his well being. CSW provided reflective listening during session.  Plan-CSW will follow up with patient and see if there are any further social work needs within two weeks.  [name], BSW, MSW, [email or web address] Phone: [phone] Fax: [phone]

## 2015-11-12 ENCOUNTER — Telehealth: Payer: Self-pay | Admitting: Hematology and Oncology

## 2015-11-12 ENCOUNTER — Ambulatory Visit: Payer: Commercial Managed Care - HMO

## 2015-11-12 ENCOUNTER — Other Ambulatory Visit: Payer: Commercial Managed Care - HMO

## 2015-11-12 DIAGNOSIS — Z5181 Encounter for therapeutic drug level monitoring: Secondary | ICD-10-CM

## 2015-11-12 DIAGNOSIS — C3412 Malignant neoplasm of upper lobe, left bronchus or lung: Secondary | ICD-10-CM

## 2015-11-12 DIAGNOSIS — Z5111 Encounter for antineoplastic chemotherapy: Secondary | ICD-10-CM

## 2015-11-12 MED ORDER — SODIUM CHLORIDE 0.9 % IJ SOLN
10.0000 mL | INTRAMUSCULAR | Status: DC | PRN
  Filled 2015-11-12: qty 10

## 2015-11-12 NOTE — Progress Notes (Signed)
Patient was here for lab and flush, with md appt and chemo tomorrow.  Pt preferred to have lab and flush done on same day as chemo.  Spoke with [facility] [place] in scheduling and rescheduled appts for tomorrow 6/28.  No treatment was done today

## 2015-11-12 NOTE — Telephone Encounter (Signed)
Per flush nurse pt wanted lab and flush done tomorrow... sched apt, printed cal and flush nurse gave pt calender

## 2015-11-13 ENCOUNTER — Ambulatory Visit (HOSPITAL_BASED_OUTPATIENT_CLINIC_OR_DEPARTMENT_OTHER): Payer: Commercial Managed Care - HMO | Admitting: Hematology and Oncology

## 2015-11-13 ENCOUNTER — Ambulatory Visit: Payer: Commercial Managed Care - HMO

## 2015-11-13 ENCOUNTER — Encounter: Payer: Self-pay | Admitting: Hematology and Oncology

## 2015-11-13 ENCOUNTER — Telehealth: Payer: Self-pay | Admitting: Hematology and Oncology

## 2015-11-13 ENCOUNTER — Ambulatory Visit (HOSPITAL_BASED_OUTPATIENT_CLINIC_OR_DEPARTMENT_OTHER): Payer: Commercial Managed Care - HMO

## 2015-11-13 ENCOUNTER — Other Ambulatory Visit (HOSPITAL_BASED_OUTPATIENT_CLINIC_OR_DEPARTMENT_OTHER): Payer: Commercial Managed Care - HMO

## 2015-11-13 VITALS — BP 114/65 | HR 78 | Temp 97.8°F | Resp 17 | Ht 64.0 in | Wt 167.5 lb

## 2015-11-13 DIAGNOSIS — C3412 Malignant neoplasm of upper lobe, left bronchus or lung: Secondary | ICD-10-CM

## 2015-11-13 DIAGNOSIS — C7931 Secondary malignant neoplasm of brain: Secondary | ICD-10-CM

## 2015-11-13 DIAGNOSIS — R21 Rash and other nonspecific skin eruption: Secondary | ICD-10-CM

## 2015-11-13 DIAGNOSIS — F17200 Nicotine dependence, unspecified, uncomplicated: Secondary | ICD-10-CM

## 2015-11-13 DIAGNOSIS — C7949 Secondary malignant neoplasm of other parts of nervous system: Secondary | ICD-10-CM

## 2015-11-13 DIAGNOSIS — R609 Edema, unspecified: Secondary | ICD-10-CM | POA: Diagnosis not present

## 2015-11-13 DIAGNOSIS — Z79899 Other long term (current) drug therapy: Secondary | ICD-10-CM

## 2015-11-13 DIAGNOSIS — C189 Malignant neoplasm of colon, unspecified: Secondary | ICD-10-CM

## 2015-11-13 DIAGNOSIS — R600 Localized edema: Secondary | ICD-10-CM

## 2015-11-13 DIAGNOSIS — Z5181 Encounter for therapeutic drug level monitoring: Secondary | ICD-10-CM

## 2015-11-13 DIAGNOSIS — Z720 Tobacco use: Secondary | ICD-10-CM

## 2015-11-13 DIAGNOSIS — Z85038 Personal history of other malignant neoplasm of large intestine: Secondary | ICD-10-CM

## 2015-11-13 DIAGNOSIS — Z5111 Encounter for antineoplastic chemotherapy: Secondary | ICD-10-CM

## 2015-11-13 DIAGNOSIS — Z5112 Encounter for antineoplastic immunotherapy: Secondary | ICD-10-CM

## 2015-11-13 LAB — CBC WITH DIFFERENTIAL/PLATELET
BASO%: 0.4 % (ref 0.0–2.0)
Basophils Absolute: 0 10*3/uL (ref 0.0–0.1)
EOS%: 1.8 % (ref 0.0–7.0)
Eosinophils Absolute: 0.1 10*3/uL (ref 0.0–0.5)
HEMATOCRIT: 38.2 % (ref 34.8–46.6)
HEMOGLOBIN: 11.8 g/dL (ref 11.6–15.9)
LYMPH#: 0.9 10*3/uL (ref 0.9–3.3)
LYMPH%: 17.5 % (ref 14.0–49.7)
MCH: 24.7 pg — AB (ref 25.1–34.0)
MCHC: 30.9 g/dL — AB (ref 31.5–36.0)
MCV: 79.9 fL (ref 79.5–101.0)
MONO#: 0.4 10*3/uL (ref 0.1–0.9)
MONO%: 8 % (ref 0.0–14.0)
NEUT%: 72.3 % (ref 38.4–76.8)
NEUTROS ABS: 3.6 10*3/uL (ref 1.5–6.5)
NRBC: 0 % (ref 0–0)
PLATELETS: 174 10*3/uL (ref 145–400)
RBC: 4.78 10*6/uL (ref 3.70–5.45)
RDW: 15.9 % — AB (ref 11.2–14.5)
WBC: 5 10*3/uL (ref 3.9–10.3)

## 2015-11-13 LAB — COMPREHENSIVE METABOLIC PANEL
ANION GAP: 8 meq/L (ref 3–11)
AST: 11 U/L (ref 5–34)
Albumin: 2.9 g/dL — ABNORMAL LOW (ref 3.5–5.0)
Alkaline Phosphatase: 140 U/L (ref 40–150)
BUN: 11.5 mg/dL (ref 7.0–26.0)
CALCIUM: 9 mg/dL (ref 8.4–10.4)
CHLORIDE: 108 meq/L (ref 98–109)
CO2: 25 meq/L (ref 22–29)
CREATININE: 1.2 mg/dL — AB (ref 0.6–1.1)
EGFR: 55 mL/min/{1.73_m2} — ABNORMAL LOW (ref >90)
Glucose: 94 mg/dl (ref 70–140)
Potassium: 4.2 mEq/L (ref 3.5–5.1)
Sodium: 141 mEq/L (ref 136–145)
Total Bilirubin: 0.43 mg/dL (ref 0.20–1.20)
Total Protein: 7.2 g/dL (ref 6.4–8.3)

## 2015-11-13 LAB — TSH: TSH: 1.07 m[IU]/L (ref 0.308–3.960)

## 2015-11-13 MED ORDER — SODIUM CHLORIDE 0.9 % IJ SOLN
10.0000 mL | INTRAMUSCULAR | Status: DC | PRN
  Administered 2015-11-13: 10 mL via INTRAVENOUS
  Filled 2015-11-13: qty 10

## 2015-11-13 MED ORDER — HEPARIN SOD (PORK) LOCK FLUSH 100 UNIT/ML IV SOLN
500.0000 [IU] | Freq: Once | INTRAVENOUS | Status: AC | PRN
  Administered 2015-11-13: 500 [IU]
  Filled 2015-11-13: qty 5

## 2015-11-13 MED ORDER — NIVOLUMAB CHEMO INJECTION 100 MG/10ML
240.0000 mg | Freq: Once | INTRAVENOUS | Status: AC
  Administered 2015-11-13: 240 mg via INTRAVENOUS
  Filled 2015-11-13: qty 20

## 2015-11-13 MED ORDER — SODIUM CHLORIDE 0.9 % IV SOLN
Freq: Once | INTRAVENOUS | Status: AC
  Administered 2015-11-13: 10:00:00 via INTRAVENOUS

## 2015-11-13 MED ORDER — SODIUM CHLORIDE 0.9% FLUSH
10.0000 mL | INTRAVENOUS | Status: DC | PRN
  Administered 2015-11-13: 10 mL
  Filled 2015-11-13: qty 10

## 2015-11-13 NOTE — Assessment & Plan Note (Signed)
It is very particular as it only affects her hands and her feet. She is quite asymptomatic. It has improved since I put her on topical cream. This is classic hand foot syndrome, mild Observe for now I recommend topical moisturizer

## 2015-11-13 NOTE — Patient Instructions (Signed)

## 2015-11-13 NOTE — Progress Notes (Signed)
[facility] OFFICE PROGRESS NOTE  Patient Care Team: [name], MD as PCP - General (Family Medicine) Gaye Pollack, MD (Cardiothoracic Surgery) Thea Silversmith, MD (Radiation Oncology) Dickie La, MD (Family Medicine) Heath Lark, MD as Consulting Physician (Hematology and Oncology) Renella Cunas, MD as Consulting Physician (Cardiology) Greg Cutter, LCSW as [place] [facility] (Licensed Clinical Social Worker) Valente David, RN as [place] [facility]  SUMMARY OF ONCOLOGIC HISTORY: Oncology History   Colon cancer   Primary site: Colon and Rectum (Left)   Staging method: AJCC 7th Edition   Clinical: Stage I (T2, N0, M0) signed by Heath Lark, MD on 05/31/2013  2:39 PM   Pathologic: Stage I (T2, N0, cM0) signed by Heath Lark, MD on 05/31/2013  2:39 PM   Summary: Stage I (T2, N0, cM0) Lung cancer, EGFR/ALK negative, recurrence after initial resection to LN and brain   Primary site: Lung (Left)   Staging method: AJCC 7th Edition   Clinical: Stage IV (T1, N2, M1b) signed by Heath Lark, MD on 05/31/2013  2:26 PM   Pathologic: Stage IV (T1, N2, M1b) signed by Heath Lark, MD on 05/31/2013  2:26 PM   Summary: Stage IV (T1, N2, M1b)       Cancer of upper lobe of left lung, Adenocarcinoma   03/01/2007 Procedure Colonoscopy revealed abnormalities and biopsy show high-grade dysplasia   04/01/2007 Surgery She underwent sigmoid resection which showed T2 N0 colon cancer, and negative margins and all of 17 lymph nodes were negative   02/29/2008 Procedure Repeat surveillance colonoscopy was negative.   06/16/2010 Surgery She underwent left upper lobectomy we show well-differentiated adenocarcinoma of the lung, T1, N0, M0   03/18/2011 Procedure Repeat colonoscopy show multiple polyps but there were benign   10/19/2011 Procedure Biopsy of mediastinal lymph node came back positive for recurrence of lung cancer, EGFR and ALK negative   11/16/2011  - 12/14/2011 Chemotherapy She received concurrent chemoradiation therapy with weekly carboplatin and Taxol.   11/16/2011 - 12/29/2011 Radiation Therapy She received radiation therapy with weekly chemotherapy   02/15/2012 Imaging MR of the brain showed a new intracranial metastases. This was subsequently treated with stereotactic radiosurgery.   03/07/2012 - 04/12/2013 Chemotherapy She received chemotherapy with maintainence Alimta every 3 weeks. Chemotherapy was discontinued due to profound fatigue   03/02/2013 Procedure She had therapeutic ultrasound guidance thoracentesis for pleural effusion that came back negative for cancer   05/04/2013 Procedure She had repeat ultrasound-guided thoracentesis again and cytology was negative   05/29/2013 Imaging Repeat CT scan of the chest, abdomen and pelvis show no evidence of disease but persistent right-sided pleural effusion   06/02/2013 Surgery The patient had placement of Pleurx catheter and subsequently underwent pleurodesis.   09/22/2013 Imaging Repeat CT scan show no evidence of active disease. There are nonspecific lymphadenopathy and she is placed on observation.   03/23/2014 Imaging Repeat CT scan of the chest, abdomen and pelvis show recurrence of cancer with widespread bilateral pulmonary metastasis.   05/14/2014 Imaging Imaging of the chest and brain were repeated due to delay of initiation of chemotherapy. Overall, chest CT scan show stable disease. MRI of the head was negative for recurrence   05/16/2014 - 07/18/2014 Chemotherapy  she completed 4 cycles of combination chemotherapy with carboplatin and Alimta   07/16/2014 Imaging  repeat CT scan of the chest, abdomen and pelvis show regression in the size of lung nodules.   08/29/2014 - 08/14/2015 Chemotherapy She  received maintenance Alimta   10/16/2014 Imaging  repeat CT scan of the chest, abdomen and pelvis show regression in the size of lung nodules.   01/29/2015 Imaging Repeat CT scan showed stable disease    02/01/2015 Imaging MRI brain is negative   04/30/2015 Imaging CT scan of the chest, abdomen and pelvis showed stable disease   07/25/2015 Imaging MRI brain showed no evidence of new disease   09/09/2015 Imaging CT chest showed interval increase in size of large RIGHT upper lobe nodule is most consistent with lung cancer recurrence.   09/18/2015 -  Chemotherapy She started on Nivolumab   10/22/2015 Imaging Screening mammogram showed mild abnormality   10/31/2015 Imaging Diagnostic mammogram showed mild calcification at the right upper outer breast    Brain metastases treated with radiosurgery   02/26/2012 Initial Diagnosis Brain metastases treated with radiosurgery    INTERVAL HISTORY: Please see below for problem oriented charting. She returns prior to cycle 5 of treatment. She complained of persistent skin rash affecting her hands and feet. It causes itchiness at times. The topical steroid cream used to be helpful but now is no longer helpful. She continues to have mild persistent bilateral lower extremity edema. She complained of less fatigue with this treatment She denies recent infection She is still smoking and is not interested to quit completely  REVIEW OF SYSTEMS:   Constitutional: Denies fevers, chills or abnormal weight loss Eyes: Denies blurriness of vision Ears, nose, mouth, throat, and face: Denies mucositis or sore throat Respiratory: Denies cough, dyspnea or wheezes Cardiovascular: Denies palpitation, chest discomfort  Gastrointestinal:  Denies nausea, heartburn or change in bowel habits Lymphatics: Denies new lymphadenopathy or easy bruising Neurological:Denies numbness, tingling or new weaknesses Behavioral/Psych: Mood is stable, no new changes  All other systems were reviewed with the patient and are negative.  I have reviewed the past medical history, past surgical history, social history and family history with the patient and they are unchanged from previous  note.  ALLERGIES:  is allergic to adhesive and lisinopril.  MEDICATIONS:  Current Outpatient Prescriptions  Medication Sig Dispense Refill  . albuterol (PROVENTIL HFA;VENTOLIN HFA) 108 (90 BASE) MCG/ACT inhaler Inhale 1 puff into the lungs 2 (two) times daily. (Patient taking differently: Inhale 2 puffs into the lungs every 6 (six) hours as needed. ) 6.7 g prn  . aspirin 81 MG chewable tablet Chew 81 mg by mouth daily.    Marland Kitchen CAPSAICIN EX Apply topically as needed. To feet    . cholecalciferol (VITAMIN D) 1000 units tablet Take 1,000 Units by mouth daily.    . clonazePAM (KLONOPIN) 0.5 MG tablet Take 0.5 mg by mouth 2 (two) times daily as needed. Reported on 08/28/2015    . COLACE 100 MG capsule Take 100 mg by mouth daily.    . diclofenac sodium (VOLTAREN) 1 % GEL Apply 2 g topically 4 (four) times daily. As needed for pain 100 g 3  . FLUoxetine (PROZAC) 20 MG tablet TAKE 1 TABLET DAILY 90 tablet 0  . gabapentin (NEURONTIN) 300 MG capsule Take 1 capsule (300 mg total) by mouth 3 (three) times daily. 90 capsule 1  . HYDROcodone-homatropine (HYCODAN) 5-1.5 MG/5ML syrup Take 5 mLs by mouth every 6 (six) hours as needed for cough. 240 mL 0  . lidocaine-prilocaine (EMLA) cream Apply topically as needed. Apply to porta cath site one hour prior to needle stick. 30 g 6  . nebivolol (BYSTOLIC) 10 MG tablet Take 1 tablet (10 mg total) by  mouth daily. 90 tablet 4  . nitroGLYCERIN (NITROSTAT) 0.4 MG SL tablet Place 1 tablet (0.4 mg total) under the tongue every 5 (five) minutes as needed. For chest pain 25 tablet 5  . omeprazole (PRILOSEC) 40 MG capsule TAKE 1 CAPSULE (40 MG TOTAL) BY MOUTH DAILY. 90 capsule 0  . potassium chloride SA (K-DUR,KLOR-CON) 20 MEQ tablet TAKE 1 TABLET TWICE DAILY 180 tablet 0  . pravastatin (PRAVACHOL) 40 MG tablet TAKE 1 TABLET BY MOUTH ONCE DAILY 90 tablet 3  . tiZANidine (ZANAFLEX) 2 MG tablet Take 1 tablet (2 mg total) by mouth every 6 (six) hours as needed (Back Muscle  Spasm). 30 tablet 3  . traMADol (ULTRAM) 50 MG tablet Take 1 tablet (50 mg total) by mouth every 6 (six) hours as needed for moderate pain or severe pain. 90 tablet 1  . triamcinolone ointment (KENALOG) 0.5 % Apply 1 application topically 2 (two) times daily. 30 g 0  . umeclidinium-vilanterol (ANORO ELLIPTA) 62.5-25 MCG/INH AEPB Inhale 1 puff into the lungs daily.    . [DISCONTINUED] buPROPion (WELLBUTRIN XL) 150 MG 24 hr tablet Take 1 tablet (150 mg total) by mouth 2 (two) times daily. 60 tablet 5   No current facility-administered medications for this visit.   Facility-Administered Medications Ordered in Other Visits  Medication Dose Route Frequency Provider Last Rate Last Dose  . 0.9 %  sodium chloride infusion   Intravenous Once Heath Lark, MD      . heparin lock flush 100 unit/mL  500 Units Intracatheter Once PRN Heath Lark, MD      . nivolumab (OPDIVO) 240 mg in sodium chloride 0.9 % 100 mL chemo infusion  240 mg Intravenous Once Heath Lark, MD      . sodium chloride 0.9 % injection 10 mL  10 mL Intravenous PRN Heath Lark, MD   10 mL at 09/18/15 1345  . sodium chloride flush (NS) 0.9 % injection 10 mL  10 mL Intracatheter PRN Heath Lark, MD        PHYSICAL EXAMINATION: ECOG PERFORMANCE STATUS: 1 - Symptomatic but completely ambulatory  Filed Vitals:   11/13/15 0936  BP: 114/65  Pulse: 78  Temp: 97.8 F (36.6 C)  Resp: 17   Filed Weights   11/13/15 0936  Weight: 167 lb 8 oz (75.978 kg)    GENERAL:alert, no distress and comfortable SKIN: Noted persistent skin rash consistent with hand-foot syndrome EYES: normal, Conjunctiva are pink and non-injected, sclera clear OROPHARYNX:no exudate, no erythema and lips, buccal mucosa, and tongue normal  NECK: supple, thyroid normal size, non-tender, without nodularity LYMPH:  no palpable lymphadenopathy in the cervical, axillary or inguinal LUNGS: clear to auscultation and percussion with normal breathing effort HEART: regular rate &  rhythm and no murmurs with mild bilateral lower extremity edema ABDOMEN:abdomen soft, non-tender and normal bowel sounds Musculoskeletal:no cyanosis of digits and no clubbing  NEURO: alert & oriented x 3 with fluent speech, no focal motor/sensory deficits  LABORATORY DATA:  I have reviewed the data as listed    Component Value Date/Time   NA 141 11/13/2015 0849   NA 141 12/26/2013 0946   NA 143 09/28/2011 0833   K 4.2 11/13/2015 0849   K 4.0 12/26/2013 0946   K 3.5 09/28/2011 0833   CL 106 12/26/2013 0946   CL 102 10/24/2012 1001   CL 98 09/28/2011 0833   CO2 25 11/13/2015 0849   CO2 27 12/26/2013 0946   CO2 30 09/28/2011 0833   GLUCOSE  94 11/13/2015 0849   GLUCOSE 101* 12/26/2013 0946   GLUCOSE 111* 10/24/2012 1001   GLUCOSE 112 09/28/2011 0833   BUN 11.5 11/13/2015 0849   BUN 9 12/26/2013 0946   BUN 9 09/28/2011 0833   CREATININE 1.2* 11/13/2015 0849   CREATININE 0.83 12/26/2013 0946   CREATININE 0.7 09/28/2011 0833   CALCIUM 9.0 11/13/2015 0849   CALCIUM 8.8 12/26/2013 0946   CALCIUM 8.7 09/28/2011 0833   PROT 7.2 11/13/2015 0849   PROT 6.8 12/26/2013 0946   PROT 7.2 09/28/2011 0833   ALBUMIN 2.9* 11/13/2015 0849   ALBUMIN 3.8 12/26/2013 0946   ALBUMIN 3.2* 09/28/2011 0833   AST 11 11/13/2015 0849   AST 12 12/26/2013 0946   AST 15 09/28/2011 0833   ALT <9 11/13/2015 0849   ALT <8 12/26/2013 0946   ALT 16 09/28/2011 0833   ALKPHOS 140 11/13/2015 0849   ALKPHOS 148* 12/26/2013 0946   ALKPHOS 99* 09/28/2011 0833   BILITOT 0.43 11/13/2015 0849   BILITOT 0.6 12/26/2013 0946   BILITOT 0.80 09/28/2011 0833   GFRNONAA 74* 06/12/2013 0625   GFRAA 86* 06/12/2013 0625    No results found for: SPEP, UPEP  Lab Results  Component Value Date   WBC 5.0 11/13/2015   NEUTROABS 3.6 11/13/2015   HGB 11.8 11/13/2015   HCT 38.2 11/13/2015   MCV 79.9 11/13/2015   PLT 174 11/13/2015      Chemistry      Component Value Date/Time   NA 141 11/13/2015 0849   NA 141  12/26/2013 0946   NA 143 09/28/2011 0833   K 4.2 11/13/2015 0849   K 4.0 12/26/2013 0946   K 3.5 09/28/2011 0833   CL 106 12/26/2013 0946   CL 102 10/24/2012 1001   CL 98 09/28/2011 0833   CO2 25 11/13/2015 0849   CO2 27 12/26/2013 0946   CO2 30 09/28/2011 0833   BUN 11.5 11/13/2015 0849   BUN 9 12/26/2013 0946   BUN 9 09/28/2011 0833   CREATININE 1.2* 11/13/2015 0849   CREATININE 0.83 12/26/2013 0946   CREATININE 0.7 09/28/2011 0833      Component Value Date/Time   CALCIUM 9.0 11/13/2015 0849   CALCIUM 8.8 12/26/2013 0946   CALCIUM 8.7 09/28/2011 0833   ALKPHOS 140 11/13/2015 0849   ALKPHOS 148* 12/26/2013 0946   ALKPHOS 99* 09/28/2011 0833   AST 11 11/13/2015 0849   AST 12 12/26/2013 0946   AST 15 09/28/2011 0833   ALT <9 11/13/2015 0849   ALT <8 12/26/2013 0946   ALT 16 09/28/2011 0833   BILITOT 0.43 11/13/2015 0849   BILITOT 0.6 12/26/2013 0946   BILITOT 0.80 09/28/2011 0833      ASSESSMENT & PLAN:  Cancer of upper lobe of left lung, Adenocarcinoma Unfortunately, recent blood test with third-generation sequencing show no specific treatment available for mutation detected. She was started on first dose Nivolumab and tolerated that very well except for skin rash. I will continue treatment without dose adjustment. Plan to restage prior to next Rx to assess response to treatment  TOBACCO ABUSE I spent some time counseling the patient the importance of tobacco cessation. She is currently not interested to quit now.    Brain metastases treated with radiosurgery Her most recent MRI in March 2017 showed no evidence of disease recurrence. Continue to monitor carefully.    Bilateral leg edema She has bilateral lower extremity edema, left greater than the right. This is likely related to low  albumin with poor circulation. She had colon cancer and some of the lymph nodes were removed. Lasix is not helping, rightfully so since this is not congestive heart  failure. The Lasix has caused significant hypotension and dizziness. I told her to discontinue Lasix permanently Continue close monitoring only  Skin rash It is very particular as it only affects her hands and her feet. She is quite asymptomatic. It has improved since I put her on topical cream. This is classic hand foot syndrome, mild Observe for now I recommend topical moisturizer   Orders Placed This Encounter  Procedures  . CT CHEST W CONTRAST    Standing Status: Future     Number of Occurrences:      Standing Expiration Date: 01/12/2017    Order Specific Question:  Reason for Exam (SYMPTOM  OR DIAGNOSIS REQUIRED)    Answer:  staging lung ca, assess response to Rx    Order Specific Question:  Preferred imaging location?    Answer:  [facility]  . CT ABDOMEN PELVIS W CONTRAST    Standing Status: Future     Number of Occurrences:      Standing Expiration Date: 02/12/2017    Order Specific Question:  Reason for Exam (SYMPTOM  OR DIAGNOSIS REQUIRED)    Answer:  staging lung ca, assess response to Rx    Order Specific Question:  Preferred imaging location?    Answer:  [facility]   All questions were answered. The patient knows to call the clinic with any problems, questions or concerns. No barriers to learning was detected. I spent 20 minutes counseling the patient face to face. The total time spent in the appointment was 25 minutes and more than 50% was on counseling and review of test results     [facility], [place], MD 11/13/2015 10:18 AM

## 2015-11-13 NOTE — Assessment & Plan Note (Signed)
She has bilateral lower extremity edema, left greater than the right. This is likely related to low albumin with poor circulation. She had colon cancer and some of the lymph nodes were removed. Lasix is not helping, rightfully so since this is not congestive heart failure. The Lasix has caused significant hypotension and dizziness. I told her to discontinue Lasix permanently Continue close monitoring only

## 2015-11-13 NOTE — Assessment & Plan Note (Signed)
I spent some time counseling the patient the importance of tobacco cessation. She is currently not interested to quit now. 

## 2015-11-13 NOTE — Telephone Encounter (Signed)
Gave andr printed appt sched and avs for pt for July

## 2015-11-13 NOTE — Assessment & Plan Note (Signed)
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 Continue to monitor carefully.

## 2015-11-13 NOTE — Patient Instructions (Signed)
[facility] Discharge Instructions for Patients Receiving Chemotherapy  Today you received the following chemotherapy agents Nivolumab  To help prevent nausea and vomiting after your treatment, we encourage you to take your nausea medication   If you develop nausea and vomiting that is not controlled by your nausea medication, call the clinic.   BELOW ARE SYMPTOMS THAT SHOULD BE REPORTED IMMEDIATELY:  *FEVER GREATER THAN 100.5 F  *CHILLS WITH OR WITHOUT FEVER  NAUSEA AND VOMITING THAT IS NOT CONTROLLED WITH YOUR NAUSEA MEDICATION  *UNUSUAL SHORTNESS OF BREATH  *UNUSUAL BRUISING OR BLEEDING  TENDERNESS IN MOUTH AND THROAT WITH OR WITHOUT PRESENCE OF ULCERS  *URINARY PROBLEMS  *BOWEL PROBLEMS  UNUSUAL RASH Items with * indicate a potential emergency and should be followed up as soon as possible.  Feel free to call the clinic you have any questions or concerns. The clinic phone number is [phone].  Please show the [facility] at check-in to the Emergency Department and triage nurse.  Dehydration, Adult Dehydration is a condition in which you do not have enough fluid or water in your body. It happens when you take in less fluid than you lose. Vital organs such as the kidneys, brain, and heart cannot function without a proper amount of fluids. Any loss of fluids from the body can cause dehydration.  Dehydration can range from mild to severe. This condition should be treated right away to help prevent it from becoming severe. CAUSES  This condition may be caused by:  Vomiting.  Diarrhea.  Excessive sweating, such as when exercising in hot or humid weather.  Not drinking enough fluid during strenuous exercise or during an illness.  Excessive urine output.  Fever.  Certain medicines. RISK FACTORS This condition is more likely to develop in:  People who are taking certain medicines that cause the body to lose excess fluid (diuretics).    People who have a chronic illness, such as diabetes, that may increase urination.  Older adults.   People who live at high altitudes.   People who participate in endurance sports.  SYMPTOMS  Mild Dehydration  Thirst.  Dry lips.  Slightly dry mouth.  Dry, warm skin. Moderate Dehydration  Very dry mouth.   Muscle cramps.   Dark urine and decreased urine production.   Decreased tear production.   Headache.   Light-headedness, especially when you stand up from a sitting position.  Severe Dehydration  Changes in skin.   Cold and clammy skin.   Skin does not spring back quickly when lightly pinched and released.   Changes in body fluids.   Extreme thirst.   No tears.   Not able to sweat when body temperature is high, such as in hot weather.   Minimal urine production.   Changes in vital signs.   Rapid, weak pulse (more than 100 beats per minute when you are sitting still).   Rapid breathing.   Low blood pressure.   Other changes.   Sunken eyes.   Cold hands and feet.   Confusion.  Lethargy and difficulty being awakened.  Fainting (syncope).   Short-term weight loss.   Unconsciousness. DIAGNOSIS  This condition may be diagnosed based on your symptoms. You may also have tests to determine how severe your dehydration is. These tests may include:   Urine tests.   Blood tests.  TREATMENT  Treatment for this condition depends on the severity. Mild or moderate dehydration can often be treated at home. Treatment should be  started right away. Do not wait until dehydration becomes severe. Severe dehydration needs to be treated at the hospital. Treatment for Mild Dehydration  Drinking plenty of water to replace the fluid you have lost.   Replacing minerals in your blood (electrolytes) that you may have lost.  Treatment for Moderate Dehydration  Consuming oral rehydration solution (ORS). Treatment for Severe  Dehydration  Receiving fluid through an IV tube.   Receiving electrolyte solution through a feeding tube that is passed through your nose and into your stomach (nasogastric tube or NG tube).  Correcting any abnormalities in electrolytes. HOME CARE INSTRUCTIONS   Drink enough fluid to keep your urine clear or pale yellow.   Drink water or fluid slowly by taking small sips. You can also try sucking on ice cubes.  Have food or beverages that contain electrolytes. Examples include bananas and sports drinks.  Take over-the-counter and prescription medicines only as told by your health care provider.   Prepare ORS according to the manufacturer's instructions. Take sips of ORS every 5 minutes until your urine returns to normal.  If you have vomiting or diarrhea, continue to try to drink water, ORS, or both.   If you have diarrhea, avoid:   Beverages that contain caffeine.   Fruit juice.   Milk.   Carbonated soft drinks.  Do not take salt tablets. This can lead to the condition of having too much sodium in your body (hypernatremia).  SEEK MEDICAL CARE IF:  You cannot eat or drink without vomiting.  You have had moderate diarrhea during a period of more than 24 hours.  You have a fever. SEEK IMMEDIATE MEDICAL CARE IF:   You have extreme thirst.  You have severe diarrhea.  You have not urinated in 6-8 hours, or you have urinated only a small amount of very dark urine.  You have shriveled skin.  You are dizzy, confused, or both.   This information is not intended to replace advice given to you by your health care provider. Make sure you discuss any questions you have with your health care provider.   Document Released: 05/04/2005 Document Revised: 01/23/2015 Document Reviewed: 09/19/2014 Elsevier Interactive Patient Education [employer].

## 2015-11-13 NOTE — Assessment & Plan Note (Addendum)
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 She was started on first dose Nivolumab and tolerated that very well except for skin rash. I will continue treatment without dose adjustment. Plan to restage prior to next Rx to assess response to treatment

## 2015-11-15 ENCOUNTER — Other Ambulatory Visit: Payer: Self-pay | Admitting: *Deleted

## 2015-11-15 DIAGNOSIS — C801 Malignant (primary) neoplasm, unspecified: Secondary | ICD-10-CM

## 2015-11-15 DIAGNOSIS — J449 Chronic obstructive pulmonary disease, unspecified: Secondary | ICD-10-CM

## 2015-11-15 NOTE — Patient Outreach (Signed)
Call placed to member to inquire about current health status and medical supplies.  She state that she is "making it."  She denies using her prescription and insurance to ger her walker, but received one for a friend.  She also deny using resources provided to obtain commode lift and shower chair.  She got a shower chair from another family/friend.  She state that she "hate asking for handouts" and got "frustrated.  So I'll just make due with what I got."    She denies any further coordination concerns, but request to remain involved with [place], health coach for ongoing COPD education (she continues to smoke).  Referral placed back to health coach.  She is aware that this care manager will close case to community, but she is able to notify health coach if she feels another home visit is needed.  She verbalizes understanding.  Valente David, [place], MSN [employer] [phone]

## 2015-11-18 LAB — GUARDANT 360

## 2015-11-20 ENCOUNTER — Other Ambulatory Visit: Payer: Self-pay | Admitting: Obstetrics and Gynecology

## 2015-11-20 ENCOUNTER — Other Ambulatory Visit: Payer: Self-pay | Admitting: Family Medicine

## 2015-11-21 ENCOUNTER — Other Ambulatory Visit: Payer: Self-pay | Admitting: Obstetrics and Gynecology

## 2015-11-21 ENCOUNTER — Other Ambulatory Visit: Payer: Self-pay | Admitting: Family Medicine

## 2015-11-25 ENCOUNTER — Other Ambulatory Visit: Payer: Self-pay | Admitting: Licensed Clinical Social Worker

## 2015-11-25 NOTE — Patient Outreach (Signed)
[facility]) Care Management  11/25/2015  Lauren [name] 04-Jun-1950 [id number]   Assessment-CSW completed outreach attempt today to discuss any further social work needs before completing social work discharge. CSW unable to reach patient successfully. CSW left a HIPPA compliant voice message encouraging patient to return call once available.  Plan-CSW will await return call or complete an additional outreach if needed.  [name], BSW, MSW, [email or web address] Phone: [phone] Fax: [phone]

## 2015-11-26 ENCOUNTER — Other Ambulatory Visit (HOSPITAL_BASED_OUTPATIENT_CLINIC_OR_DEPARTMENT_OTHER): Payer: Commercial Managed Care - HMO

## 2015-11-26 ENCOUNTER — Ambulatory Visit: Payer: Commercial Managed Care - HMO

## 2015-11-26 ENCOUNTER — Other Ambulatory Visit: Payer: Self-pay

## 2015-11-26 ENCOUNTER — Ambulatory Visit (HOSPITAL_COMMUNITY)
Admission: RE | Admit: 2015-11-26 | Discharge: 2015-11-26 | Disposition: A | Discharge status: Home / Self Care | Payer: Commercial Managed Care - HMO | Source: Ambulatory Visit | Attending: Hematology and Oncology | Admitting: Hematology and Oncology

## 2015-11-26 ENCOUNTER — Other Ambulatory Visit: Payer: Self-pay | Admitting: Licensed Clinical Social Worker

## 2015-11-26 ENCOUNTER — Encounter: Payer: Self-pay | Admitting: Licensed Clinical Social Worker

## 2015-11-26 DIAGNOSIS — Z5111 Encounter for antineoplastic chemotherapy: Secondary | ICD-10-CM

## 2015-11-26 DIAGNOSIS — C189 Malignant neoplasm of colon, unspecified: Secondary | ICD-10-CM

## 2015-11-26 DIAGNOSIS — C3412 Malignant neoplasm of upper lobe, left bronchus or lung: Secondary | ICD-10-CM

## 2015-11-26 DIAGNOSIS — Z902 Acquired absence of lung [part of]: Secondary | ICD-10-CM | POA: Diagnosis not present

## 2015-11-26 DIAGNOSIS — K760 Fatty (change of) liver, not elsewhere classified: Secondary | ICD-10-CM | POA: Insufficient documentation

## 2015-11-26 DIAGNOSIS — Z79899 Other long term (current) drug therapy: Secondary | ICD-10-CM

## 2015-11-26 DIAGNOSIS — Z5181 Encounter for therapeutic drug level monitoring: Secondary | ICD-10-CM

## 2015-11-26 DIAGNOSIS — Z85118 Personal history of other malignant neoplasm of bronchus and lung: Secondary | ICD-10-CM | POA: Diagnosis not present

## 2015-11-26 DIAGNOSIS — I700 Atherosclerosis of aorta: Secondary | ICD-10-CM | POA: Insufficient documentation

## 2015-11-26 DIAGNOSIS — I2510 Atherosclerotic heart disease of native coronary artery without angina pectoris: Secondary | ICD-10-CM | POA: Diagnosis not present

## 2015-11-26 LAB — COMPREHENSIVE METABOLIC PANEL
ALT: <9 U/L (ref 0–55)
ANION GAP: 9 meq/L (ref 3–11)
AST: 13 U/L (ref 5–34)
Albumin: 2.9 g/dL — ABNORMAL LOW (ref 3.5–5.0)
Alkaline Phosphatase: 147 U/L (ref 40–150)
BUN: 13.3 mg/dL (ref 7.0–26.0)
CALCIUM: 9.2 mg/dL (ref 8.4–10.4)
CHLORIDE: 109 meq/L (ref 98–109)
CO2: 24 mEq/L (ref 22–29)
Creatinine: 1.3 mg/dL — ABNORMAL HIGH (ref 0.6–1.1)
EGFR: 51 mL/min/{1.73_m2} — AB (ref >90)
Glucose: 91 mg/dl (ref 70–140)
POTASSIUM: 3.9 meq/L (ref 3.5–5.1)
SODIUM: 141 meq/L (ref 136–145)
Total Bilirubin: 0.44 mg/dL (ref 0.20–1.20)
Total Protein: 7.3 g/dL (ref 6.4–8.3)

## 2015-11-26 LAB — CBC WITH DIFFERENTIAL/PLATELET
BASO%: 0.2 % (ref 0.0–2.0)
Basophils Absolute: 0 10*3/uL (ref 0.0–0.1)
EOS ABS: 0.1 10*3/uL (ref 0.0–0.5)
EOS%: 1.4 % (ref 0.0–7.0)
HCT: 38.5 % (ref 34.8–46.6)
HEMOGLOBIN: 12.2 g/dL (ref 11.6–15.9)
LYMPH%: 19.1 % (ref 14.0–49.7)
MCH: 24.6 pg — ABNORMAL LOW (ref 25.1–34.0)
MCHC: 31.7 g/dL (ref 31.5–36.0)
MCV: 77.8 fL — ABNORMAL LOW (ref 79.5–101.0)
MONO#: 0.6 10*3/uL (ref 0.1–0.9)
MONO%: 10.6 % (ref 0.0–14.0)
NEUT%: 68.7 % (ref 38.4–76.8)
NEUTROS ABS: 4 10*3/uL (ref 1.5–6.5)
Platelets: 188 10*3/uL (ref 145–400)
RBC: 4.95 10*6/uL (ref 3.70–5.45)
RDW: 15.6 % — AB (ref 11.2–14.5)
WBC: 5.8 10*3/uL (ref 3.9–10.3)
lymph#: 1.1 10*3/uL (ref 0.9–3.3)

## 2015-11-26 LAB — TSH: TSH: 1.194 m[IU]/L (ref 0.308–3.960)

## 2015-11-26 MED ORDER — IOPAMIDOL (ISOVUE-300) INJECTION 61%
100.0000 mL | Freq: Once | INTRAVENOUS | Status: AC | PRN
  Administered 2015-11-26: 100 mL via INTRAVENOUS

## 2015-11-26 MED ORDER — DIATRIZOATE MEGLUMINE & SODIUM 66-10 % PO SOLN
30.0000 mL | Freq: Once | ORAL | Status: AC
  Administered 2015-11-26: 30 mL via ORAL

## 2015-11-26 MED ORDER — SODIUM CHLORIDE 0.9 % IJ SOLN
10.0000 mL | INTRAMUSCULAR | Status: DC | PRN
  Administered 2015-11-26: 10 mL via INTRAVENOUS
  Filled 2015-11-26: qty 10

## 2015-11-26 NOTE — Patient Instructions (Signed)

## 2015-11-26 NOTE — Patient Outreach (Signed)
[facility]) Care Management  11/26/2015  Lauren [name] November 06, 1950 [id number]   Assessment- CSW received voice message from patient. CSW completed return call. Patient answered. Patient shares that she is doing well. She reports that she has not contacted the community resources for supplies because she is comfortable with the equipment she is borrowing at this time. Patient reports that she will be getting a CT scan this afternoon. Patient reports that one of her friends stated that she was worried about patient being depressed but patient reports "I have been depressed. I know what that feels like and I don't feel like that now." She shares that her friend is concerned with her isolating. Patient reports that she enjoys time alone but still sees her friends and family. Patient reports that she does not wish to be put on an psychiatric medication or to receive counseling. Patient is agreeable to this CSW providing emotional supportive interventions and education on appropriate coping methods. Patient appreciative of support. Patient reminded that this CSW can mail mental health resource list if she were to ever need it. Patient denies needing it at this time. CSW reviewed community resources in reference to financial, transportation, mental health and senior socialization opportunities. Patient denies any further social work needs at this time but is aware that she can contact this CSW with any questions or concerns after social work discharge. Patient appreciative of social work assistance.  Plan-CSW will complete social work discharge at this time and update involved [facility] staff and PCP.  [name], BSW, MSW, [email or web address] Phone: [phone] Fax: [phone]

## 2015-11-27 ENCOUNTER — Ambulatory Visit (HOSPITAL_BASED_OUTPATIENT_CLINIC_OR_DEPARTMENT_OTHER): Payer: Commercial Managed Care - HMO | Admitting: Hematology and Oncology

## 2015-11-27 ENCOUNTER — Telehealth: Payer: Self-pay | Admitting: Hematology and Oncology

## 2015-11-27 ENCOUNTER — Ambulatory Visit (HOSPITAL_BASED_OUTPATIENT_CLINIC_OR_DEPARTMENT_OTHER): Payer: Commercial Managed Care - HMO

## 2015-11-27 ENCOUNTER — Encounter: Payer: Self-pay | Admitting: Hematology and Oncology

## 2015-11-27 VITALS — BP 150/75 | HR 77 | Temp 98.1°F | Resp 18 | Wt 163.4 lb

## 2015-11-27 DIAGNOSIS — R609 Edema, unspecified: Secondary | ICD-10-CM

## 2015-11-27 DIAGNOSIS — C7949 Secondary malignant neoplasm of other parts of nervous system: Secondary | ICD-10-CM

## 2015-11-27 DIAGNOSIS — Z5112 Encounter for antineoplastic immunotherapy: Secondary | ICD-10-CM

## 2015-11-27 DIAGNOSIS — J449 Chronic obstructive pulmonary disease, unspecified: Secondary | ICD-10-CM

## 2015-11-27 DIAGNOSIS — C7931 Secondary malignant neoplasm of brain: Secondary | ICD-10-CM

## 2015-11-27 DIAGNOSIS — C3412 Malignant neoplasm of upper lobe, left bronchus or lung: Secondary | ICD-10-CM | POA: Diagnosis not present

## 2015-11-27 DIAGNOSIS — R05 Cough: Secondary | ICD-10-CM

## 2015-11-27 DIAGNOSIS — L271 Localized skin eruption due to drugs and medicaments taken internally: Secondary | ICD-10-CM | POA: Diagnosis not present

## 2015-11-27 DIAGNOSIS — R21 Rash and other nonspecific skin eruption: Secondary | ICD-10-CM

## 2015-11-27 DIAGNOSIS — E46 Unspecified protein-calorie malnutrition: Secondary | ICD-10-CM

## 2015-11-27 DIAGNOSIS — F17200 Nicotine dependence, unspecified, uncomplicated: Secondary | ICD-10-CM

## 2015-11-27 DIAGNOSIS — Z85038 Personal history of other malignant neoplasm of large intestine: Secondary | ICD-10-CM

## 2015-11-27 DIAGNOSIS — Z720 Tobacco use: Secondary | ICD-10-CM

## 2015-11-27 MED ORDER — NIVOLUMAB CHEMO INJECTION 100 MG/10ML
240.0000 mg | Freq: Once | INTRAVENOUS | Status: AC
  Administered 2015-11-27: 240 mg via INTRAVENOUS
  Filled 2015-11-27: qty 20

## 2015-11-27 MED ORDER — SODIUM CHLORIDE 0.9% FLUSH
10.0000 mL | INTRAVENOUS | Status: DC | PRN
  Administered 2015-11-27: 10 mL
  Filled 2015-11-27: qty 10

## 2015-11-27 MED ORDER — SODIUM CHLORIDE 0.9 % IV SOLN
Freq: Once | INTRAVENOUS | Status: AC
  Administered 2015-11-27: 12:00:00 via INTRAVENOUS

## 2015-11-27 MED ORDER — HEPARIN SOD (PORK) LOCK FLUSH 100 UNIT/ML IV SOLN
500.0000 [IU] | Freq: Once | INTRAVENOUS | Status: AC | PRN
  Administered 2015-11-27: 500 [IU]
  Filled 2015-11-27: qty 5

## 2015-11-27 NOTE — Progress Notes (Signed)
Subjective:     Patient ID: Lauren [name], female   DOB: 1951-03-16, 64 y.o.   MRN: [id number]  HPI   Cancer of upper lobe of left lung, Adenocarcinoma  >>  DrGorsuch    03/01/2007 Procedure Colonoscopy revealed abnormalities and biopsy show high-grade dysplasia   04/01/2007 Surgery She underwent sigmoid resection which showed T2 N0 colon cancer, and negative margins and all of 17 lymph nodes were negative   02/29/2008 Procedure Repeat surveillance colonoscopy was negative.   06/16/2010 Surgery She underwent left upper lobectomy we show well-differentiated adenocarcinoma of the lung, T1, N0, M0   03/18/2011 Procedure Repeat colonoscopy show multiple polyps but they were benign   10/19/2011 Procedure Biopsy of mediastinal lymph node came back positive for recurrence of lung cancer, EGFR and ALK negative   11/16/2011 - 12/14/2011 Chemotherapy She received concurrent chemoradiation therapy with weekly carboplatin and Taxol.   11/16/2011 - 12/29/2011 Radiation Therapy She received radiation therapy with weekly chemotherapy   02/15/2012 Imaging MR of the brain showed a new intracranial metastases. This was subsequently treated with stereotactic radiosurgery.   03/07/2012 - 04/12/2013 Chemotherapy She received chemotherapy with maintainence Alimta every 3 weeks. Chemotherapy was discontinued due to profound fatigue   03/02/2013 Procedure She had therapeutic ultrasound guidance thoracentesis for pleural effusion that came back negative for cancer   05/04/2013 Procedure She had repeat ultrasound-guided thoracentesis again and cytology was negative   05/29/2013 Imaging Repeat CT scan of the chest, abdomen and pelvis show no evidence of disease but persistent right-sided pleural effusion   06/02/2013 Surgery The patient had placement of Pleurx catheter and subsequently underwent pleurodesis.   09/22/2013 Imaging Repeat CT scan show no evidence of active  disease. There are nonspecific lymphadenopathy and she is placed on observation.   03/23/2014 Imaging Repeat CT scan of the chest, abdomen and pelvis show recurrence of cancer with widespread bilateral pulmonary metastasis.   05/14/2014 Imaging Imaging of the chest and brain were repeated due to delay of initiation of chemotherapy. Overall, chest CT scan show stable disease. MRI of the head was negative for recurrence   05/16/2014 - 07/18/2014 Chemotherapy she completed 4 cycles of combination chemotherapy with carboplatin and Alimta   07/16/2014 Imaging repeat CT scan of the chest, abdomen and pelvis show regression in the size of lung nodules.   10/16/2014 Imaging repeat CT scan of the chest, abdomen and pelvis show regression in the size of lung nodules.   01/29/2015 Imaging Repeat CT scan showed stable disease   02/01/2015 Imaging MRI brain is negative   04/30/2015 Imaging CT scan of the chest, abdomen and pelvis showed stable disease         ~  July 15, 2015:  Initial pulmonary consult by SN>   16 y/o BF referred by [facility] for COPD & dyspnea> she has a hx of stage 1 colon cancer diagnosed Nov2008, and stage 4 lung cancer diagnosed Jan2012...    Lauren [name] was diagnosed with Adenocarcinoma of lung Dx 05/2010 w/ LULobectomy, then 10/2011 mediastinal node Bx pos for metastatic lung cancer, given chemoradiation, then 01/2012 diagnosed w/ brain mets treated w/ stereotactic radiosurg, followed by additional chemotherapy (maintenance Altima)- stopped 03/2013 due to severe fatigue;  She developed right pleural effusion, neg cytology, drained & pleurodesis 05/2013;  CT Chest 03/2014 showed widespread pulm mets- treated w/ Carboplatin/ Altima x 4cycles last 06/2014; follow up CT scans have shown regression in size of lung nodules, then stability...     She is a current everyday  smoker- starting at age 22 & smoked for 50 yrs up to 1ppd, currently smoking 1/2-1ppd; she is not  interested in smoking cessation help;  Prev PFTs showed mod airflow obstruction c/w GOLD Stage 3 COPD and last CXR 12/2013 showed norm heart size, right sided pot-a-cath, right effusion, incr soft tissue changes along the right paratrachel region- stable, NAD;  Last CT Chest 04/2015 showed norm heart size, atherosclerosis in Ao & coronaries, no definite new mediastinal or hilar adenopathy, s/p left upper lobectomy, septal thickening & micronodularity throughout right lung w/o change from 01/2015, chr pleural thickening & calcif + mucoid impactions (see full report)...    Lauren [name] is c/o SOB/DOE w/ any activity- ok at rest but says she feels like there is an elephant on her body weighing her down, she feels tight, occas wheezing, min cough- mostly irritative and more at night, no sput, no hemoptysis, denies CP but she is weak & has fallen several times;  She lives alone & is here w/ her cousin today... Current meds include>  AlbutHFA prn, ZPH15, Bystolic10, AVWPV94, I01KPV, Prav40, Prilosec40, Zanaflex2 prn, Tramadol50 prn, Prozac20, Remeron15... EXAM shows Afeb, VSS, O2sat=100% on RA at rest;  HEENT- neg, mallamapti1;  Chest- sl decr BS right base, clear w/o w/r/r;  Heart- RR w/o m/r/g;  Abd- soft, nontender, neg;  Ext- VI, 1+edema;  Neuro- gait abn & neuropathy...   CXR 07/15/15>  Norm heart size, post radiation scarring on right & bibasilar scarring w/o change from prev, clips in left hilum & right sided port-a-cath stable...  Spirometry 07/15/15>  FVC=1.58 (63%), FEV1=0.98 (50%), %1sec=62, mid-flows are reduced at 26% predicted;  This is c/w mod airflow obstruction and GOLD Stage 2-3 COPD...  Ambulatory oximetry 07/15/15 on RA>  O2sat=100% on RA at rest w/ pulse=85/min; she ambulated only 1lap, stopped 1/2 way due to dizziness & was able to continue under her own power, lowest O2sat=99% w/ pulse=90/min... IMP >>      COPD/ Emphysema>  She has mod airflow obstruction and GOLD Stage 3 COPD; try ANORO one  inhalation daily + ProairHFA rescue inhaler prn...    Stage 4 Adenocarcinoma of the Lung>  Followed by DrGorsuch regularly=> see Table of treatment modalities...    Chronic right pleural effusion>  Non-malignant on thoracenteses, s/p pleur-X cath drainage and pleurodesis...    Cigarette Smoker>  She is still smoking 1/2 to 1ppd (w/ a 50+pack-yr smoking hx) and we discussed cutting back some to start...    Hx Hypoxemic Resp Failure>  She has been on Home O2 from prev hospitalization but current oxygenation at rest & with walking is OK...    MEDICAL issues>  HBP. CAD, VI/ edema, HL, GERD, Hx colon cancer, DJD, LBP, anxiety/ depression; her PCP [facility], DrMcDiarmid... PLAN >>     We discussed the imperative to quit smoking;  She will try to cut back;  We will start Rx w/ ANORO one inhalation daily & use the PROAIR-HFA as a rescue inhaler;  Finally we discussed a trial of low dose KLONOPIN 0.'25mg'$  bid to see if it relaxes her & improves her dyspnea... We will f/u in 6wks...  ~  August 28, 2015:  6wk ROV w/ SN>      Valeria returns having started [facility] one inhalation daily, PROAIR-HFA rescue inhaler prn, and KLONOPIN 0.'5mg'$ Bid;  However she states that she isn't really taking any of these meds due to various perceived issues- not doing the Anoro daily (?but can't tell me why?- cost,  other), and she states that she "couldn't handle the Klonopin due to dizziness but she notes that it DID help her breathing;  She is still smoking but thinks that she's prob cut back to 1/2 ppd;  She had 3 f/u appts in March>   1) She's had f/u w/ DrGorsuch for Oncology> on maintenance chemotherapy (Altima- Q28d now) & she says she is going to stay on this "to keep the cancer at [place]" 2) She had f/u DrSquires for RAD-ONC> s/p XRT for 2 brain mets and f/u MRI w/o recurrence; pt tells me "I am one of her miracle pts" 3) She's had f/u by her PCP- DrMcDiarmid for her dizziness, falls, 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     COPD/ Emphysema>  She has mod airflow obstruction and GOLD Stage 3 COPD; Rec- ANORO one inhalation daily + ProairHFA rescue inhaler prn...    Stage 4 Adenocarcinoma of the Lung>  Followed by DrGorsuch regularly & currently on maintenance ALTIMA Q28d regimen=> see Table of treatment modalities...    Chronic right pleural effusion>  Non-malignant on thoracenteses, s/p pleur-X cath drainage and pleurodesis...    Cigarette Smoker>  She is still smoking 1/2 to 1ppd (w/ a 50+pack-yr smoking hx) and we discussed cutting back some to start...    Hx Hypoxemic Resp Failure>  She has been on Home O2 from prev hospitalization but current oxygenation at rest & with walking is OK...    Severely deconditioned>  She has received [facility] outreach, and PT & Neuro-rehab from PCP for her deconditioning; she needs to quit all smoking and incr exercise regimen...Marland KitchenMarland KitchenMarland Kitchen     MEDICAL issues>  HBP. CAD, VI/ edema, HL, GERD, Hx colon cancer, DJD, LBP, anxiety/ depression, ANEMIA, NEUROPATHY; her PCP is [facility], DrMcDiarmid... EXAM shows Afeb, VSS, O2sat=99% on RA at rest;  HEENT- neg, mallamapti1;  Chest- sl decr BS right base, clear w/o w/r/r;  Heart- RR w/o m/r/g;  Abd- soft, nontender, neg;  Ext- VI, 1+edema;  Neuro- gait abn & neuropathy...  IMP/PLAN>>  We discussed her progress & she is reminded of the NEED to quit all smoking, take the Anoro once daly every day, use the rescue inhaler as needed, and restart the Klonopin at 1/2 tab Bid;  Finally she needs to increase her exercise program... we will plan a routine ROV in 88mo    Past Medical History  Diagnosis Date  . Depression   . Hyperlipidemia   . Hypertension   . Lung nodule     FNA ordered for 04/02/10 by HA>pos Ca  . Chronic folliculitis     of groin  . Fibrocystic breast changes   . Family history of trichomonal vaginitis 05/2005  . Postmenopausal   . Depressive disorder    . Hypercholesterolemia   . GERD (gastroesophageal reflux disease)   . Cerebral aneurysm   . Back pain   . Shortness of breath   . COPD (chronic obstructive pulmonary disease) ([place]   . Coronary artery disease   . Arthritis   . Weight loss 10/22/2011  . Status post radiation therapy 11/16/11 - 12/29/11    Right Lung and Mediastinum: 60 Gy  . Status post chemotherapy comp. 12/29/11    Carboplatin/Taxol  . S/P radiation therapy 03/01/12    SRS: 1 fraction / 20 Gray each to the Left Occipital Region and to the Right Insular Metastases  . On antineoplastic chemotherapy started  02/2012    Alimta  . Fatigue 02/06/2013  . Pleural effusion 05/03/2013  . Blurred vision, bilateral 06/06/2014  . Colon cancer ([place]) 11/08  . Lung cancer ([place]) 06/16/10    PET scan 04/28/2010; primary: increase in size 02/2010 / Well Differentiated Adenocarcinoma of the lung   . Brain metastases ([place]) 02/15/12  . Other fatigue 11/28/2014  . Bilateral pleural effusion 08/09/2013  . POSTMENOPAUSAL SYNDROME 01/31/2009    Qualifier: Diagnosis of  By: Carlena Sax  MD, Colletta [place]    . Hypokalemia 08/09/2013  . 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 Bilateral leg edema 10/17/2014  . 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 Pleural effusion, malignant 05/2013    Recurrent Pleural Effusion  . Status post stereotactic radiosurgery 01/2012    for Brain Metastases  . Tobacco dependence   . Family history of Huntington's disease   . Acute on chronic respiratory failure with hypoxemia ([place]) 07/11/2015  . Falls frequently 08/02/2015  . Benign paroxysmal positional vertigo 08/16/2015  . Vitamin D deficiency 08/30/2015  . Encounter for therapeutic drug monitoring 09/11/2015  . Encounter for antineoplastic chemotherapy 09/11/2015    Past Surgical History  Procedure Laterality Date  . Colectomy  03/22/07    Stage 1 pT2 N0, M0 Adenocarcinoma of the sigmoid  colon  . Tubal ligation    . Lung lobectomy  06/16/10    Left Upper Lobectomy  . Hernia  repair    . Breast surgery      Bil lumpectomy  . Back surgery      Dr Luiz Ochoa  . Mediastinoscopy  10/19/2011    Procedure: MEDIASTINOSCOPY;  Surgeon: Gaye Pollack, MD;  Location: [facility] OR;  Service: Thoracic;  Laterality: N/A;  . Cardiac cath x3    . Chest tube insertion Right 06/12/2013    Procedure: INSERTION PLEURAL DRAINAGE CATHETER;  Surgeon: Gaye Pollack, MD;  Location: [place];  Service: Thoracic;  Laterality: Right;  . Talc pleurodesis Right 06/12/2013    Procedure: Pietro Cassis;  Surgeon: Gaye Pollack, MD;  Location: [place];  Service: Thoracic;  Laterality: Right;  . Tunneled venous catheter placement      Port-a-Cath    Outpatient Encounter Prescriptions as of 08/28/2015  Medication Sig  . albuterol (PROVENTIL HFA;VENTOLIN HFA) 108 (90 BASE) MCG/ACT inhaler Inhale 1 puff into the lungs 2 (two) times daily. (Patient taking differently: Inhale 2 puffs into the lungs every 6 (six) hours as needed. )  . aspirin 81 MG chewable tablet Chew 81 mg by mouth daily.  Marland Kitchen lidocaine-prilocaine (EMLA) cream Apply topically as needed. Apply to porta cath site one hour prior to needle stick.  . nitroGLYCERIN (NITROSTAT) 0.4 MG SL tablet Place 1 tablet (0.4 mg total) under the tongue every 5 (five) minutes as needed. For chest pain  . tiZANidine (ZANAFLEX) 2 MG tablet Take 1 tablet (2 mg total) by mouth every 6 (six) hours as needed (Back Muscle Spasm).  . traMADol (ULTRAM) 50 MG tablet Take 1 tablet (50 mg total) by mouth every 6 (six) hours as needed for moderate pain or severe pain.  Marland Kitchen umeclidinium-vilanterol (ANORO ELLIPTA) 62.5-25 MCG/INH AEPB Inhale 1 puff into the lungs daily.  . diclofenac sodium (VOLTAREN) 1 % GEL Apply 2 g topically 4 (four) times daily. As needed for pain  . [FLUoxetine (PROZAC) 20 MG tablet Take 1 tablet by mouth daily. Reported on 7/82/9562  .  folic acid (FOLVITE) 1 MG tablet Take 1 tablet (1 mg total) by mouth daily. (  Patient not taking: Reported on 10/03/2015)  .   furosemide (LASIX) 20 MG tablet Take 1 tablet (20 mg total) by mouth daily.  . nebivolol (BYSTOLIC) 10 MG tablet Take 1 tablet (10 mg total) by mouth daily.  Marland Kitchen omeprazole (PRILOSEC) 40 MG capsule Take 1 capsule (40 mg total) by mouth daily.  .  potassium chloride SA (K-DUR,KLOR-CON) 20 MEQ tablet TAKE 1 TABLET TWICE DAILY (Patient taking differently: TAKE 1 TABLET once DAILY)  .  pravastatin (PRAVACHOL) 40 MG tablet TAKE 1 TABLET BY MOUTH ONCE DAILY  . clonazePAM (KLONOPIN) 0.5 MG tablet Take 0.5 mg by mouth 2 (two) times daily as needed. Reported on 08/28/2015    Allergies  Allergen Reactions  . Adhesive [Tape] Rash  . Lisinopril Cough    Immunization History  Administered Date(s) Administered  . Influenza Split 04/22/2011, 02/19/2012  . Influenza Whole 02/13/2008, 04/23/2009, 02/27/2010  . Influenza,inj,Quad PF,36+ Mos 02/06/2013, 02/16/2014, 01/30/2015  . Pneumococcal Conjugate-13 11/08/2014  . Pneumococcal Polysaccharide-23 05/05/2010  . Td 01/16/2005  . Tdap 08/01/2015  . Zoster 04/17/2014    Family History  Problem Relation Age of Onset  . Stomach cancer Maternal Aunt   . Breast cancer Cousin   . Cancer Sister     Lymphatic  . Osteoarthritis Father   . Gout Father   . Hypertension Father   . Heart disease Mother     pericarditis;   . Anesthesia problems Neg Hx   . Huntington's disease Son   . Huntington's disease Son     Social History   Social History  . Marital Status: Widowed    Spouse Name: N/A  . Number of Children: 3  . Years of Education: 12   Occupational History  . retired-graphic Engineer, civil (consulting)    Social History Main Topics  . Smoking status: Current Every Day Smoker -- 0.75 packs/day    Types: Cigarettes    Start date: 05/18/1965  . Smokeless tobacco: Never Used  . Alcohol Use: 0.0 oz/week    0 Standard drinks or equivalent per week     Comment: occasional  . Drug Use: No  . Sexual Activity: Not on file   Other Topics Concern  . Not on  file   Social History Narrative   Health Care POA:    Emergency Contact: daughter, Tye [place]: filed in chart   Who lives with you: self   Any pets: none   Diet: patient has a varied diet.   Exercise: pt does not have a regular exercise routine.   Seatbelts: Pt reports wearing seatbelt when in vehicles.    Hobbies: listening to music   Social:Widowed for more than 10 years and living in [place]. Patient has 3 adult children: 1 daughter [name]) and two sons; (one living in [place] [facility] with Huntington's Disease and another son in [place], [place] with possible Huntington's Disease.   Patient is independent with ADLs. Walks with a cane as needed only.   Transportation: owns a car and still driving.    Caregiver and Emergency contact: Daughter, [name], [phone]    DME: [name], [employer]   Advance Directive: Yes, LW and HCPOA. Patient wishes to be cremated    Current Medications, Allergies, Past Medical History, Past Surgical History, Family History, and Social History were reviewed in [employer] record.   Review of Systems             All symptoms  NEG except where BOLDED >>  Constitutional:  F/C/S, fatigue, anorexia, unexpected weight change. HEENT:  HA, visual changes, hearing loss, earache, nasal symptoms, sore throat, mouth sores, hoarseness. Resp:  cough, sputum, hemoptysis; SOB, tightness, wheezing. Cardio:  CP, palpit, DOE, orthopnea, edema. GI:  N/V/D/C, blood in stool; reflux, abd pain, distention, gas. GU:  dysuria, freq, urgency, hematuria, flank pain, voiding difficulty. MS:  joint pain, swelling, tenderness, decr ROM; neck pain, back pain, etc. Neuro:  HA, tremors, seizures, dizziness, syncope, weakness, numbness, gait abn. Skin:  suspicious lesions or skin rash. Heme:  adenopathy, bruising, bleeding. Psyche:  confusion, agitation, sleep disturbance, hallucinations, anxiety,  depression suicidal.   Objective:   Physical Exam       Vital Signs:  Reviewed...  General:  WD, WN, 65 y/o BF in NAD, chr ill appearing; alert & oriented; pleasant & cooperative... HEENT:  [place]/AT; Conjunctiva- pink, Sclera- nonicteric, EOM-wnl, PERRLA, EACs-wax, TMs-no vis; NOSE-clear; THROAT-clear & wnl. Neck:  Supple w/ decr ROM; no JVD; normal carotid impulses w/o bruits; no thyromegaly or nodules palpated; no lymphadenopathy. Chest:  Sl decr BS right base, otherw clear without wheezes, rales, or rhonchi heard. Heart:  Regular Rhythm; norm S1 & S2 without murmurs, rubs, or gallops detected. Abdomen:  Soft & nontender- no guarding or rebound; normal bowel sounds; no organomegaly or masses palpated. Ext:  decr ROM; without deformities, +arthritic changes; no varicose veins, +venous insuffic, 1+edema;  Pulses intact w/o bruits. Neuro:  CNs II-XII intact; motor testing normal; sensory testing abnormal; gait abnormal & balance if fair Derm:  No lesions noted; no rash etc. Lymph:  No cervical, supraclavicular, axillary, or inguinal adenopathy palpated.   Assessment:      IMP >>      COPD/ Emphysema>  She has mod airflow obstruction and GOLD Stage 3 COPD; Rec- ANORO one inhalation daily + ProairHFA rescue inhaler prn...    Stage 4 Adenocarcinoma of the Lung>  Followed by DrGorsuch regularly & currently on maintenance ALTIMA Q28d regimen=> see Table of treatment modalities...    Chronic right pleural effusion>  Non-malignant on thoracenteses, s/p pleur-X cath drainage and pleurodesis...    Cigarette Smoker>  She is still smoking 1/2 to 1ppd (w/ a 50+pack-yr smoking hx) and we discussed cutting back some to start...    Hx Hypoxemic Resp Failure>  She has been on Home O2 from prev hospitalization but current oxygenation at rest & with walking is OK...    Severely deconditioned>  She has received [facility] outreach, and PT & Neuro-rehab from PCP for her deconditioning; she needs to quit all smoking and  incr exercise regimen...Marland KitchenMarland KitchenMarland Kitchen     MEDICAL issues>  HBP. CAD, VI/ edema, HL, GERD, Hx colon cancer, DJD, LBP, anxiety/ depression, ANEMIA, NEUROPATHY; her PCP is [facility], DrMcDiarmid...  PLAN >>  07/15/15>   We discussed the imperative to quit smoking;  She will try to cut back;  We will start Rx w/ ANORO one inhalation daily & use the PROAIR-HFA as a rescue inhaler;  Finally we discussed a trial of low dose KLONOPIN 0.68m bid to see if it relaxes her & improves her dyspnea... 08/28/15>   We discussed her progress & she is reminded of the NEED to quit all smoking, take the Anoro once daly every day, use the rescue inhaler as needed, and restart the Klonopin at 1/2 tab Bid;  Finally she needs to increase her exercise program... we will plan a routine ROV in 341mo    Plan:  Patient's Medications  New Prescriptions                    ANORO >> one inhalation daily         CLONAZEPAM (KLONOPIN) 0.5 MG TABLET    Take 1 tablet (0.5 mg total) by mouth 2 (two) times daily as needed for anxiety.  Previous Medications   ALBUTEROL (PROVENTIL HFA;VENTOLIN HFA) 108 (90 BASE) MCG/ACT INHALER    Inhale 1 puff into the lungs 2 (two) times daily.   ASPIRIN 81 MG CHEWABLE TABLET    Chew 81 mg by mouth daily.   DICLOFENAC SODIUM (VOLTAREN) 1 % GEL    Apply 2 g topically 4 (four) times daily. As needed for pain   FLUOXETINE (PROZAC) 20 MG TABLET    Take 1 tablet by mouth daily.   FOLIC ACID (FOLVITE) 1 MG TABLET    Take 1 tablet (1 mg total) by mouth daily.   FUROSEMIDE (LASIX) 20 MG TABLET    Take 1 tablet (20 mg total) by mouth daily.   LIDOCAINE-PRILOCAINE (EMLA) CREAM    Apply topically as needed. Apply to porta cath site one hour prior to needle stick.   MIRTAZAPINE (REMERON) 15 MG TABLET    Take 1 tablet (15 mg total) by mouth at bedtime.   NEBIVOLOL (BYSTOLIC) 10 MG TABLET    Take 1 tablet (10 mg total) by mouth daily.   NITROGLYCERIN (NITROSTAT) 0.4 MG SL TABLET    Place 1 tablet (0.4 mg  total) under the tongue every 5 (five) minutes as needed. For chest pain   OMEPRAZOLE (PRILOSEC) 40 MG CAPSULE    Take 1 capsule (40 mg total) by mouth daily.   PATADAY 0.2 % SOLN    as needed.    POTASSIUM CHLORIDE SA (K-DUR,KLOR-CON) 20 MEQ TABLET    TAKE 1 TABLET TWICE DAILY   PRAVASTATIN (PRAVACHOL) 40 MG TABLET    TAKE 1 TABLET BY MOUTH ONCE DAILY   TIZANIDINE (ZANAFLEX) 2 MG TABLET    Take 1 tablet (2 mg total) by mouth every 6 (six) hours as needed (Back Muscle Spasm).   TRAMADOL (ULTRAM) 50 MG TABLET    Take 1 tablet (50 mg total) by mouth every 6 (six) hours as needed for moderate pain or severe pain.  Modified Medications   No medications on file  Discontinued Medications   No medications on file

## 2015-11-27 NOTE — Assessment & Plan Note (Signed)
There could be an element of depression. I recommend she takes Remeron consistently. She will continue to increase oral intake as tolerated.

## 2015-11-27 NOTE — Patient Instructions (Signed)
[facility] Discharge Instructions for Patients Receiving Chemotherapy  Today you received the following chemotherapy agents Nivolumab  To help prevent nausea and vomiting after your treatment, we encourage you to take your nausea medication as needed   If you develop nausea and vomiting that is not controlled by your nausea medication, call the clinic.   BELOW ARE SYMPTOMS THAT SHOULD BE REPORTED IMMEDIATELY:  *FEVER GREATER THAN 100.5 F  *CHILLS WITH OR WITHOUT FEVER  NAUSEA AND VOMITING THAT IS NOT CONTROLLED WITH YOUR NAUSEA MEDICATION  *UNUSUAL SHORTNESS OF BREATH  *UNUSUAL BRUISING OR BLEEDING  TENDERNESS IN MOUTH AND THROAT WITH OR WITHOUT PRESENCE OF ULCERS  *URINARY PROBLEMS  *BOWEL PROBLEMS  UNUSUAL RASH Items with * indicate a potential emergency and should be followed up as soon as possible.  Feel free to call the clinic you have any questions or concerns. The clinic phone number is [phone].  Please show the CHEMO ALERT CARD at check-in to the Emergency Department and triage nurse.   

## 2015-11-27 NOTE — Telephone Encounter (Signed)
Gv pt appts for 7/26 + 8/9.

## 2015-11-27 NOTE — Assessment & Plan Note (Addendum)
She has excellent response to treatment. CT scan showed complete resolution of the right upper lung nodule. We will continue treatment every other week and I plan to recheck CT scan again in 3 months, due around October 2017

## 2015-11-27 NOTE — Assessment & Plan Note (Signed)
She continues to have chronic cough. She continues to smoke. This is likely due to COPD. CT scan showed no evidence of pneumonia. She will continue cough suppressant as needed

## 2015-11-27 NOTE — Progress Notes (Signed)
[facility] OFFICE PROGRESS NOTE  Patient Care Team: [name], MD as PCP - General (Family Medicine) Gaye Pollack, MD (Cardiothoracic Surgery) Thea Silversmith, MD (Radiation Oncology) Dickie La, MD (Family Medicine) Heath Lark, MD as Consulting Physician (Hematology and Oncology) Renella Cunas, MD as Consulting Physician (Cardiology) Verlin Grills, RN as [facility]  SUMMARY OF ONCOLOGIC HISTORY: Oncology History   Colon cancer   Primary site: Colon and Rectum (Left)   Staging method: AJCC 7th Edition   Clinical: Stage I (T2, N0, M0) signed by Heath Lark, MD on 05/31/2013  2:39 PM   Pathologic: Stage I (T2, N0, cM0) signed by Heath Lark, MD on 05/31/2013  2:39 PM   Summary: Stage I (T2, N0, cM0) Lung cancer, EGFR/ALK negative, recurrence after initial resection to LN and brain   Primary site: Lung (Left)   Staging method: AJCC 7th Edition   Clinical: Stage IV (T1, N2, M1b) signed by Heath Lark, MD on 05/31/2013  2:26 PM   Pathologic: Stage IV (T1, N2, M1b) signed by Heath Lark, MD on 05/31/2013  2:26 PM   Summary: Stage IV (T1, N2, M1b)       Cancer of upper lobe of left lung, Adenocarcinoma   03/01/2007 Procedure Colonoscopy revealed abnormalities and biopsy show high-grade dysplasia   04/01/2007 Surgery She underwent sigmoid resection which showed T2 N0 colon cancer, and negative margins and all of 17 lymph nodes were negative   02/29/2008 Procedure Repeat surveillance colonoscopy was negative.   06/16/2010 Surgery She underwent left upper lobectomy we show well-differentiated adenocarcinoma of the lung, T1, N0, M0   03/18/2011 Procedure Repeat colonoscopy show multiple polyps but there were benign   10/19/2011 Procedure Biopsy of mediastinal lymph node came back positive for recurrence of lung cancer, EGFR and ALK negative   11/16/2011 - 12/14/2011 Chemotherapy She received concurrent chemoradiation therapy with weekly  carboplatin and Taxol.   11/16/2011 - 12/29/2011 Radiation Therapy She received radiation therapy with weekly chemotherapy   02/15/2012 Imaging MR of the brain showed a new intracranial metastases. This was subsequently treated with stereotactic radiosurgery.   03/07/2012 - 04/12/2013 Chemotherapy She received chemotherapy with maintainence Alimta every 3 weeks. Chemotherapy was discontinued due to profound fatigue   03/02/2013 Procedure She had therapeutic ultrasound guidance thoracentesis for pleural effusion that came back negative for cancer   05/04/2013 Procedure She had repeat ultrasound-guided thoracentesis again and cytology was negative   05/29/2013 Imaging Repeat CT scan of the chest, abdomen and pelvis show no evidence of disease but persistent right-sided pleural effusion   06/02/2013 Surgery The patient had placement of Pleurx catheter and subsequently underwent pleurodesis.   09/22/2013 Imaging Repeat CT scan show no evidence of active disease. There are nonspecific lymphadenopathy and she is placed on observation.   03/23/2014 Imaging Repeat CT scan of the chest, abdomen and pelvis show recurrence of cancer with widespread bilateral pulmonary metastasis.   05/14/2014 Imaging Imaging of the chest and brain were repeated due to delay of initiation of chemotherapy. Overall, chest CT scan show stable disease. MRI of the head was negative for recurrence   05/16/2014 - 07/18/2014 Chemotherapy  she completed 4 cycles of combination chemotherapy with carboplatin and Alimta   07/16/2014 Imaging  repeat CT scan of the chest, abdomen and pelvis show regression in the size of lung nodules.   08/29/2014 - 08/14/2015 Chemotherapy She received maintenance Alimta   10/16/2014 Imaging  repeat CT scan of the  chest, abdomen and pelvis show regression in the size of lung nodules.   01/29/2015 Imaging Repeat CT scan showed stable disease   02/01/2015 Imaging MRI brain is negative   04/30/2015 Imaging CT scan of the chest,  abdomen and pelvis showed stable disease   07/25/2015 Imaging MRI brain showed no evidence of new disease   09/09/2015 Imaging CT chest showed interval increase in size of large RIGHT upper lobe nodule is most consistent with lung cancer recurrence.   09/18/2015 -  Chemotherapy She started on Nivolumab   10/22/2015 Imaging Screening mammogram showed mild abnormality   10/31/2015 Imaging Diagnostic mammogram showed mild calcification at the right upper outer breast   11/27/2015 Imaging Stable post treatment related changes of left lower lobectomy and radiation therapy redemonstrated, as above. No definite findings to suggest local recurrence of disease or metastatic disease on today's examination    Brain metastases treated with radiosurgery   02/26/2012 Initial Diagnosis Brain metastases treated with radiosurgery    INTERVAL HISTORY: Please see below for problem oriented charting. She returns for further follow-up. She denies new neurological deficit. 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 She has chronic nonproductive cough. No hemoptysis. Her leg swelling has improved. Denies any changes in bowel habits or recent infection  REVIEW OF SYSTEMS:   Constitutional: Denies fevers, chills or abnormal weight loss Eyes: Denies blurriness of vision Ears, nose, mouth, throat, and face: Denies mucositis or sore throat Cardiovascular: Denies palpitation, chest discomfort or lower extremity swelling Gastrointestinal:  Denies nausea, heartburn or change in bowel habits Lymphatics: Denies new lymphadenopathy or easy bruising Neurological:Denies numbness, tingling or new weaknesses Behavioral/Psych: Mood is stable, no new changes  All other systems were reviewed with the patient and are negative.  I have reviewed the past medical history, past surgical history, social history and family history with the patient and they are unchanged from previous  note.  ALLERGIES:  is allergic to adhesive and lisinopril.  MEDICATIONS:  Current Outpatient Prescriptions  Medication Sig Dispense Refill  . albuterol (PROVENTIL HFA;VENTOLIN HFA) 108 (90 BASE) MCG/ACT inhaler Inhale 1 puff into the lungs 2 (two) times daily. (Patient taking differently: Inhale 2 puffs into the lungs every 6 (six) hours as needed. ) 6.7 g prn  . aspirin 81 MG chewable tablet Chew 81 mg by mouth daily.    Marland Kitchen BYSTOLIC 10 MG tablet TAKE 1 TABLET (10 MG TOTAL) BY MOUTH DAILY. 90 tablet 3  . CAPSAICIN EX Apply topically as needed. To feet    . cholecalciferol (VITAMIN D) 1000 units tablet Take 1,000 Units by mouth daily.    . clonazePAM (KLONOPIN) 0.5 MG tablet Take 0.5 mg by mouth 2 (two) times daily as needed. Reported on 08/28/2015    . COLACE 100 MG capsule Take 100 mg by mouth daily.    . diclofenac sodium (VOLTAREN) 1 % GEL Apply 2 g topically 4 (four) times daily. As needed for pain 100 g 3  . FLUoxetine (PROZAC) 20 MG tablet TAKE 1 TABLET EVERY DAY 90 tablet 0  . gabapentin (NEURONTIN) 300 MG capsule Take 1 capsule (300 mg total) by mouth 3 (three) times daily. 90 capsule 1  . HYDROcodone-homatropine (HYCODAN) 5-1.5 MG/5ML syrup Take 5 mLs by mouth every 6 (six) hours as needed for cough. 240 mL 0  . lidocaine-prilocaine (EMLA) cream Apply topically as needed. Apply to porta cath site one hour prior to needle stick. 30 g 6  . nitroGLYCERIN (  NITROSTAT) 0.4 MG SL tablet Place 1 tablet (0.4 mg total) under the tongue every 5 (five) minutes as needed. For chest pain 25 tablet 5  . omeprazole (PRILOSEC) 40 MG capsule TAKE 1 CAPSULE EVERY DAY 90 capsule prn  . potassium chloride SA (K-DUR,KLOR-CON) 20 MEQ tablet TAKE 1 TABLET TWICE DAILY 180 tablet 0  . pravastatin (PRAVACHOL) 40 MG tablet TAKE 1 TABLET ONE TIME DAILY 90 tablet 3  . tiZANidine (ZANAFLEX) 2 MG tablet Take 1 tablet (2 mg total) by mouth every 6 (six) hours as needed (Back Muscle Spasm). 30 tablet 3  . traMADol  (ULTRAM) 50 MG tablet Take 1 tablet (50 mg total) by mouth every 6 (six) hours as needed for moderate pain or severe pain. 90 tablet 1  . triamcinolone ointment (KENALOG) 0.5 % Apply 1 application topically 2 (two) times daily. 30 g 0  . umeclidinium-vilanterol (ANORO ELLIPTA) 62.5-25 MCG/INH AEPB Inhale 1 puff into the lungs daily.    . [DISCONTINUED] buPROPion (WELLBUTRIN XL) 150 MG 24 hr tablet Take 1 tablet (150 mg total) by mouth 2 (two) times daily. 60 tablet 5   No current facility-administered medications for this visit.   Facility-Administered Medications Ordered in Other Visits  Medication Dose Route Frequency Provider Last Rate Last Dose  . sodium chloride 0.9 % injection 10 mL  10 mL Intravenous PRN Heath Lark, MD   10 mL at 09/18/15 1345    PHYSICAL EXAMINATION: ECOG PERFORMANCE STATUS: 0 - Asymptomatic  Filed Vitals:   11/27/15 1032  BP: 150/75  Pulse: 77  Temp: 98.1 F (36.7 C)  Resp: 18   Filed Weights   11/27/15 1032  Weight: 163 lb 6.4 oz (74.118 kg)    GENERAL:alert, no distress and comfortable SKIN: She has minus skin rash on her hands EYES: normal, Conjunctiva are pink and non-injected, sclera clear OROPHARYNX:no exudate, no erythema and lips, buccal mucosa, and tongue normal  NECK: supple, thyroid normal size, non-tender, without nodularity LYMPH:  no palpable lymphadenopathy in the cervical, axillary or inguinal LUNGS: clear to auscultation and percussion with normal breathing effort HEART: regular rate & rhythm and no murmurs and no lower extremity edema ABDOMEN:abdomen soft, non-tender and normal bowel sounds Musculoskeletal:no cyanosis of digits and no clubbing  NEURO: alert & oriented x 3 with fluent speech, no focal motor/sensory deficits  LABORATORY DATA:  I have reviewed the data as listed    Component Value Date/Time   NA 141 11/26/2015 1217   NA 141 12/26/2013 0946   NA 143 09/28/2011 0833   K 3.9 11/26/2015 1217   K 4.0 12/26/2013 0946    K 3.5 09/28/2011 0833   CL 106 12/26/2013 0946   CL 102 10/24/2012 1001   CL 98 09/28/2011 0833   CO2 24 11/26/2015 1217   CO2 27 12/26/2013 0946   CO2 30 09/28/2011 0833   GLUCOSE 91 11/26/2015 1217   GLUCOSE 101* 12/26/2013 0946   GLUCOSE 111* 10/24/2012 1001   GLUCOSE 112 09/28/2011 0833   BUN 13.3 11/26/2015 1217   BUN 9 12/26/2013 0946   BUN 9 09/28/2011 0833   CREATININE 1.3* 11/26/2015 1217   CREATININE 0.83 12/26/2013 0946   CREATININE 0.7 09/28/2011 0833   CALCIUM 9.2 11/26/2015 1217   CALCIUM 8.8 12/26/2013 0946   CALCIUM 8.7 09/28/2011 0833   PROT 7.3 11/26/2015 1217   PROT 6.8 12/26/2013 0946   PROT 7.2 09/28/2011 0833   ALBUMIN 2.9* 11/26/2015 1217   ALBUMIN 3.8 12/26/2013 0946  ALBUMIN 3.2* 09/28/2011 0833   AST 13 11/26/2015 1217   AST 12 12/26/2013 0946   AST 15 09/28/2011 0833   ALT <9 11/26/2015 1217   ALT <8 12/26/2013 0946   ALT 16 09/28/2011 0833   ALKPHOS 147 11/26/2015 1217   ALKPHOS 148* 12/26/2013 0946   ALKPHOS 99* 09/28/2011 0833   BILITOT 0.44 11/26/2015 1217   BILITOT 0.6 12/26/2013 0946   BILITOT 0.80 09/28/2011 0833   GFRNONAA 74* 06/12/2013 0625   GFRAA 86* 06/12/2013 0625    No results found for: SPEP, UPEP  Lab Results  Component Value Date   WBC 5.8 11/26/2015   NEUTROABS 4.0 11/26/2015   HGB 12.2 11/26/2015   HCT 38.5 11/26/2015   MCV 77.8* 11/26/2015   PLT 188 11/26/2015      Chemistry      Component Value Date/Time   NA 141 11/26/2015 1217   NA 141 12/26/2013 0946   NA 143 09/28/2011 0833   K 3.9 11/26/2015 1217   K 4.0 12/26/2013 0946   K 3.5 09/28/2011 0833   CL 106 12/26/2013 0946   CL 102 10/24/2012 1001   CL 98 09/28/2011 0833   CO2 24 11/26/2015 1217   CO2 27 12/26/2013 0946   CO2 30 09/28/2011 0833   BUN 13.3 11/26/2015 1217   BUN 9 12/26/2013 0946   BUN 9 09/28/2011 0833   CREATININE 1.3* 11/26/2015 1217   CREATININE 0.83 12/26/2013 0946   CREATININE 0.7 09/28/2011 0833      Component Value  Date/Time   CALCIUM 9.2 11/26/2015 1217   CALCIUM 8.8 12/26/2013 0946   CALCIUM 8.7 09/28/2011 0833   ALKPHOS 147 11/26/2015 1217   ALKPHOS 148* 12/26/2013 0946   ALKPHOS 99* 09/28/2011 0833   AST 13 11/26/2015 1217   AST 12 12/26/2013 0946   AST 15 09/28/2011 0833   ALT <9 11/26/2015 1217   ALT <8 12/26/2013 0946   ALT 16 09/28/2011 0833   BILITOT 0.44 11/26/2015 1217   BILITOT 0.6 12/26/2013 0946   BILITOT 0.80 09/28/2011 0833       RADIOGRAPHIC STUDIES: I have personally reviewed the radiological images as listed and agreed with the findings in the report. Ct Chest W Contrast  11/26/2015  CLINICAL DATA:  65 year old female with history of metastatic lung cancer originally diagnosed in January 2012, with recurrence in July 2013 and October 2015. Ongoing chemotherapy. Additional remote history of colon cancer diagnosed in August 2008. Restaging examination. EXAM: CT CHEST, ABDOMEN, AND PELVIS WITH CONTRAST TECHNIQUE: Multidetector CT imaging of the chest, abdomen and pelvis was performed following the standard protocol during bolus administration of intravenous contrast. CONTRAST:  144m ISOVUE-300 IOPAMIDOL (ISOVUE-300) INJECTION 61% COMPARISON:  CT of the chest, abdomen and pelvis 04/30/2015. FINDINGS: CT CHEST FINDINGS Cardiovascular: Heart size is normal. There is no significant pericardial fluid, thickening or pericardial calcification. There is aortic atherosclerosis, as well as atherosclerosis of the great vessels of the mediastinum and the coronary arteries, including calcified atherosclerotic plaque in the left main, left anterior descending, left circumflex and right coronary arteries. Right internal jugular single-lumen porta cath with tip terminating in the right atrium. Mediastinum/Nodes: No pathologically enlarged mediastinal or hilar lymph nodes are noted. Prominent soft tissues again noted in the peritracheal and subcarinal regions, similar to the prior study. Esophagus is  unremarkable in appearance. No axillary lymphadenopathy. Lungs/Pleura: Status post left upper lobectomy. Compensatory hyperexpansion of the left lower lobe. Mild architectural distortion in the basal portion of the left  lower lobe, compatible with mild postoperative scarring. 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 This is very similar to the prior study. There also continues to be diffuse bronchial wall thickening, patchy areas of septal thickening, and extensive peribronchovascular micro nodularity throughout the right lung, most severe in the right lower lobe, which appears very similar to prior examinations, likely to reflect areas of chronic mucoid impaction within terminal bronchioles. No other larger more suspicious appearing pulmonary nodules or masses are noted. Chronic pleural thickening and calcification again noted in the lower right hemithorax, related to prior talc pleurodesis. Musculoskeletal: There are no aggressive appearing lytic or blastic lesions noted in the visualized portions of the skeleton. CT ABDOMEN AND PELVIS FINDINGS Hepatobiliary: Although there continues to be diffuse low attenuation throughout the hepatic parenchyma, compatible with hepatic steatosis, this appears less severe than the prior examination. Several tiny sub centimeter low-attenuation lesions are again noted in the liver, similar in size, number and distribution to the prior study; although these are too small to definitively characterize, these are presumably small cysts or biliary hamartomas. No suspicious hepatic lesions. No intra or extrahepatic biliary ductal dilatation. Gallbladder is normal in appearance. Pancreas: No pancreatic mass or peripancreatic inflammatory changes. No pancreatic ductal dilatation. Spleen: Unremarkable. Adrenals/Urinary Tract: Bilateral adreniform thickening is unchanged,  presumably indicative of mild adrenal hyperplasia. Bilateral kidneys are normal in appearance. No hydroureteronephrosis. Urinary bladder is normal in appearance. Stomach/Bowel: The appearance of the stomach is normal. There is no pathologic dilatation of small bowel or colon. Normal appendix. Vascular/Lymphatic: Aortic atherosclerosis, with fusiform ectasia of the infrarenal abdominal aorta which measures up to 2.9 x 2.6 cm (similar to the prior study). No lymphadenopathy noted in the abdomen or pelvis. Reproductive: Status post hysterectomy.  Ovaries are atrophic. Other: No significant volume of ascites.  No pneumoperitoneum. Musculoskeletal: There are no aggressive appearing lytic or blastic lesions noted in the visualized portions of the skeleton. IMPRESSION: 1. Stable post treatment related changes of left lower lobectomy and radiation therapy redemonstrated, as above. No definite findings to suggest local recurrence of disease or metastatic disease on today's examination. 2. Chronic areas of mucoid impaction throughout the right lung are similar to the prior study. 3. Mild hepatic steatosis slightly improved compared to the prior examination. 4. Aortic atherosclerosis, in addition to left main and 3 vessel coronary artery disease. In addition, there is similar ectasia of the infrarenal abdominal aorta, as above. 5. Additional incidental findings, as above. Electronically Signed   By: Vinnie Langton M.D.   On: 11/26/2015 17:59   Ct Abdomen Pelvis W Contrast  11/26/2015  CLINICAL DATA:  65 year old female with history of metastatic lung cancer originally diagnosed in January 2012, with recurrence in July 2013 and October 2015. Ongoing chemotherapy. Additional remote history of colon cancer diagnosed in August 2008. Restaging examination. EXAM: CT CHEST, ABDOMEN, AND PELVIS WITH CONTRAST TECHNIQUE: Multidetector CT imaging of the chest, abdomen and pelvis was performed following the standard protocol during  bolus administration of intravenous contrast. CONTRAST:  172m ISOVUE-300 IOPAMIDOL (ISOVUE-300) INJECTION 61% COMPARISON:  CT of the chest, abdomen and pelvis 04/30/2015. FINDINGS: CT CHEST FINDINGS Cardiovascular: Heart size is normal. There is no significant pericardial fluid, thickening or pericardial calcification. There is aortic atherosclerosis, as well as atherosclerosis of the great vessels of the mediastinum and the coronary arteries, including calcified atherosclerotic plaque in the left main, left anterior descending, left circumflex and right coronary arteries.  Right internal jugular single-lumen porta cath with tip terminating in the right atrium. Mediastinum/Nodes: No pathologically enlarged mediastinal or hilar lymph nodes are noted. Prominent soft tissues again noted in the peritracheal and subcarinal regions, similar to the prior study. Esophagus is unremarkable in appearance. No axillary lymphadenopathy. Lungs/Pleura: Status post left upper lobectomy. Compensatory hyperexpansion of the left lower lobe. Mild architectural distortion in the basal portion of the left lower lobe, compatible with mild postoperative scarring. 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 This is very similar to the prior study. There also continues to be diffuse bronchial wall thickening, patchy areas of septal thickening, and extensive peribronchovascular micro nodularity throughout the right lung, most severe in the right lower lobe, which appears very similar to prior examinations, likely to reflect areas of chronic mucoid impaction within terminal bronchioles. No other larger more suspicious appearing pulmonary nodules or masses are noted. Chronic pleural thickening and calcification again noted in the lower right hemithorax, related to prior talc pleurodesis. Musculoskeletal: There are no aggressive  appearing lytic or blastic lesions noted in the visualized portions of the skeleton. CT ABDOMEN AND PELVIS FINDINGS Hepatobiliary: Although there continues to be diffuse low attenuation throughout the hepatic parenchyma, compatible with hepatic steatosis, this appears less severe than the prior examination. Several tiny sub centimeter low-attenuation lesions are again noted in the liver, similar in size, number and distribution to the prior study; although these are too small to definitively characterize, these are presumably small cysts or biliary hamartomas. No suspicious hepatic lesions. No intra or extrahepatic biliary ductal dilatation. Gallbladder is normal in appearance. Pancreas: No pancreatic mass or peripancreatic inflammatory changes. No pancreatic ductal dilatation. Spleen: Unremarkable. Adrenals/Urinary Tract: Bilateral adreniform thickening is unchanged, presumably indicative of mild adrenal hyperplasia. Bilateral kidneys are normal in appearance. No hydroureteronephrosis. Urinary bladder is normal in appearance. Stomach/Bowel: The appearance of the stomach is normal. There is no pathologic dilatation of small bowel or colon. Normal appendix. Vascular/Lymphatic: Aortic atherosclerosis, with fusiform ectasia of the infrarenal abdominal aorta which measures up to 2.9 x 2.6 cm (similar to the prior study). No lymphadenopathy noted in the abdomen or pelvis. Reproductive: Status post hysterectomy.  Ovaries are atrophic. Other: No significant volume of ascites.  No pneumoperitoneum. Musculoskeletal: There are no aggressive appearing lytic or blastic lesions noted in the visualized portions of the skeleton. IMPRESSION: 1. Stable post treatment related changes of left lower lobectomy and radiation therapy redemonstrated, as above. No definite findings to suggest local recurrence of disease or metastatic disease on today's examination. 2. Chronic areas of mucoid impaction throughout the right lung are similar  to the prior study. 3. Mild hepatic steatosis slightly improved compared to the prior examination. 4. Aortic atherosclerosis, in addition to left main and 3 vessel coronary artery disease. In addition, there is similar ectasia of the infrarenal abdominal aorta, as above. 5. Additional incidental findings, as above. Electronically Signed   By: Vinnie Langton M.D.   On: 11/26/2015 17:59     ASSESSMENT & PLAN:  Cancer of upper lobe of left lung, Adenocarcinoma She has excellent response to treatment. CT scan showed complete resolution of the right upper lung nodule. We will continue treatment every other week and I plan to recheck CT scan again in 3 months, due around October 2017  TOBACCO ABUSE I spent some time counseling the patient the importance of tobacco cessation. She is currently not interested to quit now.  Skin rash It is very particular as it only affects her hands and her feet. She is quite asymptomatic. It has improved since I put her on topical cream. This is classic hand foot syndrome, mild Observe for now I recommend topical moisturizer  Protein calorie malnutrition (HCC) There could be an element of depression. I recommend she takes Remeron consistently. She will continue to increase oral intake as tolerated.  COPD mixed type [facility]) She continues to have chronic cough. She continues to smoke. This is likely due to COPD. CT scan showed no evidence of pneumonia. She will continue cough suppressant as needed  History of colon cancer She has no signs of recurrence. CT scan of the chest, abdomen and pelvis on 11/26/2015 showed no evidence of recurrence. In the scope of things, I would not recommend surveillance colonoscopy.     No orders of the defined types were placed in this encounter.   All questions were answered. The patient knows to call the clinic with any problems, questions or concerns. No barriers to learning was detected. I spent 20 minutes  counseling the patient face to face. The total time spent in the appointment was 30 minutes and more than 50% was on counseling and review of test results     [facility], [place], MD 11/27/2015 11:33 AM

## 2015-11-27 NOTE — Assessment & Plan Note (Signed)
It is very particular as it only affects her hands and her feet. She is quite asymptomatic. It has improved since I put her on topical cream. This is classic hand foot syndrome, mild Observe for now I recommend topical moisturizer

## 2015-11-27 NOTE — Assessment & Plan Note (Signed)
She has no signs of recurrence. CT scan of the chest, abdomen and pelvis on 11/26/2015 showed no evidence of recurrence. In the scope of things, I would not recommend surveillance colonoscopy.

## 2015-11-27 NOTE — Assessment & Plan Note (Signed)
I spent some time counseling the patient the importance of tobacco cessation. She is currently not interested to quit now. 

## 2015-11-28 ENCOUNTER — Ambulatory Visit (INDEPENDENT_AMBULATORY_CARE_PROVIDER_SITE_OTHER): Payer: Commercial Managed Care - HMO | Admitting: Pulmonary Disease

## 2015-11-28 ENCOUNTER — Encounter: Payer: Self-pay | Admitting: Pulmonary Disease

## 2015-11-28 VITALS — BP 118/76 | HR 72 | Temp 98.3°F | Ht 64.0 in | Wt 162.0 lb

## 2015-11-28 DIAGNOSIS — J449 Chronic obstructive pulmonary disease, unspecified: Secondary | ICD-10-CM

## 2015-11-28 DIAGNOSIS — C3412 Malignant neoplasm of upper lobe, left bronchus or lung: Secondary | ICD-10-CM

## 2015-11-28 DIAGNOSIS — F17200 Nicotine dependence, unspecified, uncomplicated: Secondary | ICD-10-CM

## 2015-11-28 DIAGNOSIS — C7931 Secondary malignant neoplasm of brain: Secondary | ICD-10-CM

## 2015-11-28 DIAGNOSIS — C7949 Secondary malignant neoplasm of other parts of nervous system: Secondary | ICD-10-CM

## 2015-11-28 MED ORDER — ALBUTEROL SULFATE HFA 108 (90 BASE) MCG/ACT IN AERS: 1.0000 | INHALATION_SPRAY | Freq: Two times a day (BID) | RESPIRATORY_TRACT | Status: DC

## 2015-11-28 MED ORDER — UMECLIDINIUM-VILANTEROL 62.5-25 MCG/INH IN AEPB: 1.0000 | INHALATION_SPRAY | Freq: Every day | RESPIRATORY_TRACT | Status: DC

## 2015-11-28 NOTE — Patient Instructions (Signed)
Today we updated your med list in our EPIC system...    Continue your current medications the same...  Continue the [facility] one inhalation daily...  Please please please quit all smoking to give yourself a fighting chance!!!  Call for any questions...  Let's plan a follow up visit in 55mo sooner if needed for problems..Marland KitchenMarland Kitchen

## 2015-11-28 NOTE — Progress Notes (Signed)
Subjective:     Patient ID: Lauren [name], female   DOB: 02/20/1951, 65 y.o.   MRN: [id number]  HPI  Cancer of upper lobe of left lung, Adenocarcinoma   03/01/2007 Procedure Colonoscopy revealed abnormalities and biopsy show high-grade dysplasia   04/01/2007 Surgery She underwent sigmoid resection which showed T2 N0 colon cancer, and negative margins and all of 17 lymph nodes were negative   02/29/2008 Procedure Repeat surveillance colonoscopy was negative.   06/16/2010 Surgery She underwent left upper lobectomy we show well-differentiated adenocarcinoma of the lung, T1, N0, M0   03/18/2011 Procedure Repeat colonoscopy show multiple polyps but there were benign   10/19/2011 Procedure Biopsy of mediastinal lymph node came back positive for recurrence of lung cancer, EGFR and ALK negative   11/16/2011 - 12/14/2011 Chemotherapy She received concurrent chemoradiation therapy with weekly carboplatin and Taxol.   11/16/2011 - 12/29/2011 Radiation Therapy She received radiation therapy with weekly chemotherapy   02/15/2012 Imaging MR of the brain showed a new intracranial metastases. This was subsequently treated with stereotactic radiosurgery.   03/07/2012 - 04/12/2013 Chemotherapy She received chemotherapy with maintainence Alimta every 3 weeks. Chemotherapy was discontinued due to profound fatigue   03/02/2013 Procedure She had therapeutic ultrasound guidance thoracentesis for pleural effusion that came back negative for cancer   05/04/2013 Procedure She had repeat ultrasound-guided thoracentesis again and cytology was negative   05/29/2013 Imaging Repeat CT scan of the chest, abdomen and pelvis show no evidence of disease but persistent right-sided pleural effusion   06/02/2013 Surgery The patient had placement of Pleurx catheter and subsequently underwent pleurodesis.   09/22/2013 Imaging Repeat CT scan show no evidence of active disease. There are  nonspecific lymphadenopathy and she is placed on observation.   03/23/2014 Imaging Repeat CT scan of the chest, abdomen and pelvis show recurrence of cancer with widespread bilateral pulmonary metastasis.   05/14/2014 Imaging Imaging of the chest and brain were repeated due to delay of initiation of chemotherapy. Overall, chest CT scan show stable disease. MRI of the head was negative for recurrence   05/16/2014 - 07/18/2014 Chemotherapy she completed 4 cycles of combination chemotherapy with carboplatin and Alimta   07/16/2014 Imaging repeat CT scan of the chest, abdomen and pelvis show regression in the size of lung nodules.   08/29/2014 - 08/14/2015 Chemotherapy She received maintenance Alimta   10/16/2014 Imaging repeat CT scan of the chest, abdomen and pelvis show regression in the size of lung nodules.   01/29/2015 Imaging Repeat CT scan showed stable disease   02/01/2015 Imaging MRI brain is negative   04/30/2015 Imaging CT scan of the chest, abdomen and pelvis showed stable disease   07/25/2015 Imaging MRI brain showed no evidence of new disease   09/09/2015 Imaging CT chest showed interval increase in size of large RIGHT upper lobe nodule is most consistent with lung cancer recurrence.   09/18/2015 -  Chemotherapy She started on Nivolumab   10/22/2015 Imaging Screening mammogram showed mild abnormality   10/31/2015 Imaging Diagnostic mammogram showed mild calcification at the right upper outer breast   11/27/2015 Imaging Stable post treatment related changes of left lower lobectomy and radiation therapy redemonstrated, as above. No definite findings to suggest local recurrence of disease or metastatic disease on today's examination    Brain metastases treated with radiosurgery   02/26/2012 Initial Diagnosis Brain metastases treated with radiosurgery          ~  July 15, 2015:  Initial pulmonary consult by SN>  50 y/o BF  referred by [facility] for COPD & dyspnea> she has a hx of stage 1 colon cancer diagnosed Nov2008, and stage 4 lung cancer diagnosed Jan2012...    Lauren [name] was diagnosed with Adenocarcinoma of lung Dx 05/2010 w/ LULobectomy, then 10/2011 mediastinal node Bx pos for metastatic lung cancer, given chemoradiation, then 01/2012 diagnosed w/ brain mets treated w/ stereotactic radiosurg, followed by additional chemotherapy (maintenance Altima)- stopped 03/2013 due to severe fatigue;  She developed right pleural effusion, neg cytology, drained & pleurodesis 05/2013;  CT Chest 03/2014 showed widespread pulm mets- treated w/ Carboplatin/ Altima x 4cycles last 06/2014; follow up CT scans have shown regression in size of lung nodules, then stability...     She is a current everyday smoker- starting at age 36 & smoked for 50 yrs up to 1ppd, currently smoking 1/2-1ppd; she is not interested in smoking cessation help;  Prev PFTs showed mod airflow obstruction c/w GOLD Stage 3 COPD and last CXR 12/2013 showed norm heart size, right sided pot-a-cath, right effusion, incr soft tissue changes along the right paratrachel region- stable, NAD;  Last CT Chest 04/2015 showed norm heart size, atherosclerosis in Ao & coronaries, no definite new mediastinal or hilar adenopathy, s/p left upper lobectomy, septal thickening & micronodularity throughout right lung w/o change from 01/2015, chr pleural thickening & calcif + mucoid impactions (see full report)...    Lauren [name] is c/o SOB/DOE w/ any activity- ok at rest but says she feels like there is an elephant on her body weighing her down, she feels tight, occas wheezing, min cough- mostly irritative and more at night, no sput, no hemoptysis, denies CP but she is weak & has fallen several times;  She lives alone & is here w/ her cousin today... Current meds include>  AlbutHFA prn, MCN47, Bystolic10, SJGGE36, O29UTM, Prav40, Prilosec40, Zanaflex2 prn, Tramadol50 prn, Prozac20, Remeron15... EXAM shows Afeb,  VSS, O2sat=100% on RA at rest;  HEENT- neg, mallamapti1;  Chest- sl decr BS right base, clear w/o w/r/r;  Heart- RR w/o m/r/g;  Abd- soft, nontender, neg;  Ext- VI, 1+edema;  Neuro- gait abn & neuropathy...   CXR 07/15/15>  Norm heart size, post radiation scarring on right & bibasilar scarring w/o change from prev, clips in left hilum & right sided port-a-cath stable...  Spirometry 07/15/15>  FVC=1.58 (63%), FEV1=0.98 (50%), %1sec=62, mid-flows are reduced at 26% predicted;  This is c/w mod airflow obstruction and GOLD Stage 2-3 COPD...  Ambulatory oximetry 07/15/15 on RA>  O2sat=100% on RA at rest w/ pulse=85/min; she ambulated only 1lap, stopped 1/2 way due to dizziness & was able to continue under her own power, lowest O2sat=99% w/ pulse=90/min... IMP >>      COPD/ Emphysema>  She has mod airflow obstruction and GOLD Stage 3 COPD; try ANORO one inhalation daily + ProairHFA rescue inhaler prn...    Stage 4 Adenocarcinoma of the Lung>  Followed by DrGorsuch regularly=> see Table of treatment modalities...    Chronic right pleural effusion>  Non-malignant on thoracenteses, s/p pleur-X cath drainage and pleurodesis...    Cigarette Smoker>  She is still smoking 1/2 to 1ppd (w/ a 50+pack-yr smoking hx) and we discussed cutting back some to start...    Hx Hypoxemic Resp Failure>  She has been on Home O2 from prev hospitalization but current oxygenation at rest & with walking is OK...    MEDICAL issues>  HBP. CAD, VI/ edema, HL, GERD, Hx colon cancer, DJD, LBP, anxiety/ depression; [facility], DrMcDiarmid... PLAN >>     We discussed the imperative to quit smoking;  She will try to cut back;  We will start Rx w/ ANORO one inhalation daily & use the PROAIR-HFA as a rescue inhaler;  Finally we discussed a trial of low dose KLONOPIN 0.378m bid to see if it relaxes her & improves her dyspnea... We will f/u in 6wks...  ~  August 28, 2015:  6wk ROV w/ SN>      OShantilreturns having started  [facility] Disease Instituteone inhalation daily, PROAIR-HFA rescue inhaler prn, and KLONOPIN 0.571mid;  However she states that she isn't really taking any of these meds due to various perceived issues- not doing the Anoro daily (?but can't tell me why?- cost, other), and she states that she "couldn't handle the Klonopin due to dizziness but she notes that it DID help her breathing;  She is still smoking but thinks that she's prob cut back to 1/2 ppd;  She had 3 f/u appts in March>   1) She's had f/u w/ DrGorsuch for Oncology> on maintenance chemotherapy (Altima- Q28d now) & she says she is going to stay on this [place] She had f/u DrSquires for RAD-ONC> s/p XRT for 2 brain mets and f/u MRI w/o recurrence; pt tells me "I am one of her miracle pts" 3) She's had f/u by her PCP- DrMcDiarmid for her dizziness, falls, 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     COPD/ Emphysema>  She has mod airflow obstruction and GOLD Stage 3 COPD; Rec- ANORO one inhalation daily + ProairHFA rescue inhaler prn...    Stage 4 Adenocarcinoma of the Lung>  Followed by DrGorsuch regularly & currently on maintenance ALTIMA Q28d regimen=> see Table of treatment modalities...    Chronic right pleural effusion>  Non-malignant on thoracenteses, s/p pleur-X cath drainage and pleurodesis...    Cigarette Smoker>  She is still smoking 1/2 to 1ppd (w/ a 50+pack-yr smoking hx) and we discussed cutting back some to start...    Hx Hypoxemic Resp Failure>  She has been on Home O2 from prev hospitalization but current oxygenation at rest & with walking is OK...    Severely deconditioned>  [facility], and PT & Neuro-rehab from PCP for her deconditioning; she needs to quit all smoking and incr exercise regimen.....Marland KitchenMarland KitchenMarland Kitchen   MEDICAL issues>  HBP. CAD, VI/ edema, HL, GERD, Hx colon cancer, DJD, LBP, anxiety/ depression, ANEMIA, NEUROPATHY; [facility]... EXAM shows Afeb, VSS, O2sat=99% on RA at rest;  HEENT- neg, mallamapti1;  Chest- sl decr BS right base, clear w/o w/r/r;  Heart- RR w/o m/r/g;  Abd- soft, nontender, neg;  Ext- VI, 1+edema;  Neuro- gait abn & neuropathy...  IMP/PLAN>>  We discussed her progress & she is reminded of the NEED to quit all smoking, take the Anoro once daly every day, use the rescue inhaler as needed, and restart the Klonopin at 1/2 tab Bid;  Finally she needs to increase her exercise program... we will plan a routine ROV in 78m39mo~  November 28, 2015:  78mo32mo & OlivGeovannastill smoking ~1ppd despite all that she's been through; she says she feels well, breathing is good, walking w/ a cane & only prob is the heat & humidity;  She does her ADLs, light housework, walking some; She notes min cough, small  amt thick beige sput, no hemoptysis; she denies CP, f/c/s, etc; she has known bronchiectasis & mucoid impactions and we reviewed how helpful it would be to quit smoking, take the max Mucinex 1200Bid w/ fluids & engage in chest physiotherpy w/ Flutter valve & postural drainage (but she declines) ... She is using her ANORO 1 inhalation daily & the Albut rescue inhaler as needed...     She saw DrGorsuch, Oncology 11/27/15> Now on treatment w/ OPDIVO & CT Chest showed good response w/ resolution of RUL nodule; they plan continued Rx QoWK w/ f/u CT in 25mo..   EXAM shows Afeb, VSS, O2sat=100% on RA at rest;  HEENT- neg, mallamapti1;  Chest- sl decr BS right base, clear w/o w/r/r;  Heart- RR w/o m/r/g;  Abd- soft, nontender, neg;  Ext- VI, 1+edema;  Neuro- gait abn & neuropathy...   CT Chest 11/26/15>  S/p LLLobectomy & radiation changes, chronic areas of mucoid impaction throughout the right lung are similar to prev, Ao & coronary atherosclerosis. Ectasia of infrrenal Ao IMP/PLAN>>  Lauren [name] rec to quit smoking & continue the Anoro & Albut rescue prn; we reviewed the benefits derived from smojking cessation +  Mucinex/ fluids/ chest physiotherapy/ etc but she declines these latter treatments... we will plan ROV recheck in 670mosooner prn...     Past Medical History  Diagnosis Date  . Depression   . Hyperlipidemia   . Hypertension   . Lung nodule     FNA ordered for 04/02/10 by HA>pos Ca  . Chronic folliculitis     of groin  . Fibrocystic breast changes   . Family history of trichomonal vaginitis 05/2005  . Postmenopausal   . Depressive disorder   . Hypercholesterolemia   . GERD (gastroesophageal reflux disease)   . Cerebral aneurysm   . Back pain   . Shortness of breath   . COPD (chronic obstructive pulmonary disease) (HCSinai  . Coronary artery disease   . Arthritis   . Weight loss 10/22/2011  . Status post radiation therapy 11/16/11 - 12/29/11    Right Lung and Mediastinum: 60 Gy  . Status post chemotherapy comp. 12/29/11    Carboplatin/Taxol  . S/P radiation therapy 03/01/12    SRS: 1 fraction / 20 Gray each to the Left Occipital Region and to the Right Insular Metastases  . On antineoplastic chemotherapy started 02/2012    Alimta  . Fatigue 02/06/2013  . Pleural effusion 05/03/2013  . Blurred vision, bilateral 06/06/2014  . Colon cancer (HCStovall11/08  . Lung cancer (HCGuide Rock1/30/12    PET scan 04/28/2010; primary: increase in size 02/2010 / Well Differentiated Adenocarcinoma of the lung   . Brain metastases (HCAvalon9/30/13  . Other fatigue 11/28/2014  . Bilateral pleural effusion 08/09/2013  . POSTMENOPAUSAL SYNDROME 01/31/2009    Qualifier: Diagnosis of  By: AlCarlena SaxMD, StColletta [place]  . Hypokalemia 08/09/2013  . 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 Bilateral leg edema 10/17/2014  . 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 Pleural effusion, malignant 05/2013    Recurrent Pleural Effusion  . Status post stereotactic radiosurgery 01/2012    for Brain Metastases  . Tobacco dependence   . Family history of Huntington's disease   . Acute on chronic respiratory failure with hypoxemia (HCWaipahu 07/11/2015  . Falls frequently 08/02/2015  . Benign paroxysmal positional vertigo 08/16/2015  . Vitamin D deficiency 08/30/2015  . Encounter for therapeutic drug monitoring 09/11/2015  . Encounter for antineoplastic chemotherapy 09/11/2015  Past Surgical History  Procedure Laterality Date  . Colectomy  03/22/07    Stage 1 pT2 N0, M0 Adenocarcinoma of the sigmoid  colon  . Tubal ligation    . Lung lobectomy  06/16/10    Left Upper Lobectomy  . Hernia repair    . Breast surgery      Bil lumpectomy  . Back surgery      Dr Luiz Ochoa  . Mediastinoscopy  10/19/2011    Procedure: MEDIASTINOSCOPY;  Surgeon: Gaye Pollack, MD;  Location: [facility] OR;  Service: Thoracic;  Laterality: N/A;  . Cardiac cath x3    . Chest tube insertion Right 06/12/2013    Procedure: INSERTION PLEURAL DRAINAGE CATHETER;  Surgeon: Gaye Pollack, MD;  Location: [place];  Service: Thoracic;  Laterality: Right;  . Talc pleurodesis Right 06/12/2013    Procedure: Pietro Cassis;  Surgeon: Gaye Pollack, MD;  Location: [place];  Service: Thoracic;  Laterality: Right;  . Tunneled venous catheter placement      Port-a-Cath    Outpatient Encounter Prescriptions as of 11/28/2015  Medication Sig  . albuterol (PROVENTIL HFA;VENTOLIN HFA) 108 (90 BASE) MCG/ACT inhaler Inhale 1 puff into the lungs 2 (two) times daily. (Patient taking differently: Inhale 2 puffs into the lungs every 6 (six) hours as needed. )  . aspirin 81 MG chewable tablet Chew 81 mg by mouth daily.  Marland Kitchen lidocaine-prilocaine (EMLA) cream Apply topically as needed. Apply to porta cath site one hour prior to needle stick.  . nitroGLYCERIN (NITROSTAT) 0.4 MG SL tablet Place 1 tablet (0.4 mg total) under the tongue every 5 (five) minutes as needed. For chest pain  . tiZANidine (ZANAFLEX) 2 MG tablet Take 1 tablet (2 mg total) by mouth every 6 (six) hours as needed (Back Muscle Spasm).  . traMADol (ULTRAM) 50 MG tablet Take 1 tablet (50 mg total) by mouth every 6 (six) hours as  needed for moderate pain or severe pain.  Marland Kitchen umeclidinium-vilanterol (ANORO ELLIPTA) 62.5-25 MCG/INH AEPB Inhale 1 puff into the lungs daily.  . diclofenac sodium (VOLTAREN) 1 % GEL Apply 2 g topically 4 (four) times daily. As needed for pain  . [FLUoxetine (PROZAC) 20 MG tablet Take 1 tablet by mouth daily. Reported on 06/05/1476  .  folic acid (FOLVITE) 1 MG tablet Take 1 tablet (1 mg total) by mouth daily. (Patient not taking: Reported on 10/03/2015)  .  furosemide (LASIX) 20 MG tablet Take 1 tablet (20 mg total) by mouth daily.  . nebivolol (BYSTOLIC) 10 MG tablet Take 1 tablet (10 mg total) by mouth daily.  Marland Kitchen omeprazole (PRILOSEC) 40 MG capsule Take 1 capsule (40 mg total) by mouth daily.  .  potassium chloride SA (K-DUR,KLOR-CON) 20 MEQ tablet TAKE 1 TABLET TWICE DAILY (Patient taking differently: TAKE 1 TABLET once DAILY)  .  pravastatin (PRAVACHOL) 40 MG tablet TAKE 1 TABLET BY MOUTH ONCE DAILY  . clonazePAM (KLONOPIN) 0.5 MG tablet Take 0.5 mg by mouth 2 (two) times daily as needed. Reported on 08/28/2015    Allergies  Allergen Reactions  . Adhesive [Tape] Rash  . Lisinopril Cough    Current Medications, Allergies, Past Medical History, Past Surgical History, Family History, and Social History were reviewed in [employer] record.   Review of Systems             All symptoms NEG except where BOLDED >>  Constitutional:  F/C/S, fatigue, anorexia, unexpected weight change. HEENT:  HA, visual changes, hearing loss,  earache, nasal symptoms, sore throat, mouth sores, hoarseness. Resp:  cough, sputum, hemoptysis; SOB, tightness, wheezing. Cardio:  CP, palpit, DOE, orthopnea, edema. GI:  N/V/D/C, blood in stool; reflux, abd pain, distention, gas. GU:  dysuria, freq, urgency, hematuria, flank pain, voiding difficulty. MS:  joint pain, swelling, tenderness, decr ROM; neck pain, back pain, etc. Neuro:  HA, tremors, seizures, dizziness, syncope, weakness, numbness, gait  abn. Skin:  suspicious lesions or skin rash. Heme:  adenopathy, bruising, bleeding. Psyche:  confusion, agitation, sleep disturbance, hallucinations, anxiety, depression suicidal.   Objective:   Physical Exam       Vital Signs:  Reviewed...  General:  WD, WN, 65 y/o BF in NAD, chr ill appearing; alert & oriented; pleasant & cooperative... HEENT:  [place]/AT; Conjunctiva- pink, Sclera- nonicteric, EOM-wnl, PERRLA, EACs-wax, TMs-no vis; NOSE-clear; THROAT-clear & wnl. Neck:  Supple w/ decr ROM; no JVD; normal carotid impulses w/o bruits; no thyromegaly or nodules palpated; no lymphadenopathy. Chest:  Sl decr BS right base, otherw clear without wheezes, rales, or rhonchi heard. Heart:  Regular Rhythm; norm S1 & S2 without murmurs, rubs, or gallops detected. Abdomen:  Soft & nontender- no guarding or rebound; normal bowel sounds; no organomegaly or masses palpated. Ext:  decr ROM; without deformities, +arthritic changes; no varicose veins, +venous insuffic, 1+edema;  Pulses intact w/o bruits. Neuro:  CNs II-XII intact; motor testing normal; sensory testing abnormal; gait abnormal & balance if fair Derm:  No lesions noted; no rash etc. Lymph:  No cervical, supraclavicular, axillary, or inguinal adenopathy palpated.   Assessment:      IMP >>      COPD/ Emphysema>  She has mod airflow obstruction and GOLD Stage 3 COPD; Rec- continue ANORO one inhalation daily + ProairHFA rescue inhaler prn...    Stage 4 Adenocarcinoma of the Lung>  Followed by DrGorsuch regularly & currently on maintenance OPDIVO Q14d regimen=> see Table of treatment modalities...    Chronic right pleural effusion>  Non-malignant on thoracenteses, s/p pleur-X cath drainage and pleurodesis...    Cigarette Smoker>  She is still smoking 1ppd (w/ a 50+pack-yr smoking hx) and we discussed cutting back some to start...    Hx Hypoxemic Resp Failure>  She has been on Home O2 from prev hospitalization but current oxygenation at rest & with  walking is OK...    Severely deconditioned>  She has received [facility] outreach, and PT & Neuro-rehab from PCP for her deconditioning; she needs to quit all smoking and incr exercise regimen...Marland KitchenMarland KitchenMarland Kitchen     MEDICAL issues>  HBP. CAD, VI/ edema, HL, GERD, Hx colon cancer, DJD, LBP, anxiety/ depression, ANEMIA, NEUROPATHY; her PCP is [facility], DrMcDiarmid...  PLAN >>  07/15/15>   We discussed the imperative to quit smoking;  She will try to cut back;  We will start Rx w/ ANORO one inhalation daily & use the PROAIR-HFA as a rescue inhaler;  Finally we discussed a trial of low dose KLONOPIN 0.'25mg'$  bid to see if it relaxes her & improves her dyspnea... 08/28/15>   We discussed her progress & she is reminded of the NEED to quit all smoking, take the Anoro once daly every day, use the rescue inhaler as needed, and restart the Klonopin at 1/2 tab Bid;  Finally she needs to increase her exercise program... . 11/28/15>   Lauren [name] is rec to quit smoking & continue the Anoro & Albut rescue prn; we reviewed the benefits derived from smojking cessation + Mucinex/ fluids/ chest physiotherapy/ etc but she declines  these latter treatments... we will plan ROV recheck in 69mo sooner prn     Plan:     Patient's Medications  New Prescriptions   UMECLIDINIUM-VILANTEROL (ANORO ELLIPTA) 62.5-25 MCG/INH AEPB    Inhale 1 puff into the lungs daily.  Previous Medications   ASPIRIN 81 MG CHEWABLE TABLET    Chew 81 mg by mouth daily.   BYSTOLIC 10 MG TABLET    TAKE 1 TABLET (10 MG TOTAL) BY MOUTH DAILY.   CAPSAICIN EX    Apply topically as needed. To feet   CHOLECALCIFEROL (VITAMIN D) 1000 UNITS TABLET    Take 1,000 Units by mouth daily.   CLONAZEPAM (KLONOPIN) 0.5 MG TABLET    Take 0.5 mg by mouth 2 (two) times daily as needed. Reported on 08/28/2015   COLACE 100 MG CAPSULE    Take 100 mg by mouth daily.   DICLOFENAC SODIUM (VOLTAREN) 1 % GEL    Apply 2 g topically 4 (four) times daily. As needed for pain   FLUOXETINE (PROZAC)  20 MG TABLET    TAKE 1 TABLET EVERY DAY   GABAPENTIN (NEURONTIN) 300 MG CAPSULE    Take 1 capsule (300 mg total) by mouth 3 (three) times daily.   HYDROCODONE-HOMATROPINE (HYCODAN) 5-1.5 MG/5ML SYRUP    Take 5 mLs by mouth every 6 (six) hours as needed for cough.   LIDOCAINE-PRILOCAINE (EMLA) CREAM    Apply topically as needed. Apply to porta cath site one hour prior to needle stick.   NITROGLYCERIN (NITROSTAT) 0.4 MG SL TABLET    Place 1 tablet (0.4 mg total) under the tongue every 5 (five) minutes as needed. For chest pain   OMEPRAZOLE (PRILOSEC) 40 MG CAPSULE    TAKE 1 CAPSULE EVERY DAY   POTASSIUM CHLORIDE SA (K-DUR,KLOR-CON) 20 MEQ TABLET    TAKE 1 TABLET TWICE DAILY   PRAVASTATIN (PRAVACHOL) 40 MG TABLET    TAKE 1 TABLET ONE TIME DAILY   TIZANIDINE (ZANAFLEX) 2 MG TABLET    Take 1 tablet (2 mg total) by mouth every 6 (six) hours as needed (Back Muscle Spasm).   TRAMADOL (ULTRAM) 50 MG TABLET    Take 1 tablet (50 mg total) by mouth every 6 (six) hours as needed for moderate pain or severe pain.   TRIAMCINOLONE OINTMENT (KENALOG) 0.5 %    Apply 1 application topically 2 (two) times daily.   UMECLIDINIUM-VILANTEROL (ANORO ELLIPTA) 62.5-25 MCG/INH AEPB    Inhale 1 puff into the lungs daily.  Modified Medications   Modified Medication Previous Medication   ALBUTEROL (PROVENTIL HFA;VENTOLIN HFA) 108 (90 BASE) MCG/ACT INHALER albuterol (PROVENTIL HFA;VENTOLIN HFA) 108 (90 BASE) MCG/ACT inhaler      Inhale 1 puff into the lungs 2 (two) times daily.    Inhale 1 puff into the lungs 2 (two) times daily.  Discontinued Medications   No medications on file

## 2015-12-09 ENCOUNTER — Other Ambulatory Visit: Payer: Self-pay | Admitting: *Deleted

## 2015-12-09 NOTE — Patient Outreach (Signed)
[facility]) Care Management  12/09/2015  Lauren [name] Apr 19, 1951 [id number]   RN Health Coach  attempted #1 outreach call to patient.  Patient was unavailable. HIPPA compliance voicemail message was left with return callback number.   Plan: RN will call patient again within 14 days.   [place] Care Management [phone]

## 2015-12-11 ENCOUNTER — Other Ambulatory Visit (HOSPITAL_BASED_OUTPATIENT_CLINIC_OR_DEPARTMENT_OTHER): Payer: Commercial Managed Care - HMO

## 2015-12-11 ENCOUNTER — Ambulatory Visit: Payer: Commercial Managed Care - HMO

## 2015-12-11 ENCOUNTER — Ambulatory Visit (HOSPITAL_BASED_OUTPATIENT_CLINIC_OR_DEPARTMENT_OTHER): Payer: Commercial Managed Care - HMO

## 2015-12-11 ENCOUNTER — Telehealth: Payer: Self-pay | Admitting: Hematology and Oncology

## 2015-12-11 VITALS — BP 138/76 | HR 68 | Temp 97.7°F | Resp 18

## 2015-12-11 DIAGNOSIS — C3412 Malignant neoplasm of upper lobe, left bronchus or lung: Secondary | ICD-10-CM

## 2015-12-11 DIAGNOSIS — Z5111 Encounter for antineoplastic chemotherapy: Secondary | ICD-10-CM

## 2015-12-11 DIAGNOSIS — Z5112 Encounter for antineoplastic immunotherapy: Secondary | ICD-10-CM

## 2015-12-11 DIAGNOSIS — Z5181 Encounter for therapeutic drug level monitoring: Secondary | ICD-10-CM

## 2015-12-11 DIAGNOSIS — Z79899 Other long term (current) drug therapy: Secondary | ICD-10-CM | POA: Diagnosis not present

## 2015-12-11 DIAGNOSIS — C7931 Secondary malignant neoplasm of brain: Secondary | ICD-10-CM | POA: Diagnosis not present

## 2015-12-11 DIAGNOSIS — C7949 Secondary malignant neoplasm of other parts of nervous system: Principal | ICD-10-CM

## 2015-12-11 LAB — COMPREHENSIVE METABOLIC PANEL
ALBUMIN: 2.9 g/dL — AB (ref 3.5–5.0)
ALK PHOS: 143 U/L (ref 40–150)
ANION GAP: 8 meq/L (ref 3–11)
AST: 11 U/L (ref 5–34)
BILIRUBIN TOTAL: 0.47 mg/dL (ref 0.20–1.20)
BUN: 14.1 mg/dL (ref 7.0–26.0)
CALCIUM: 9.1 mg/dL (ref 8.4–10.4)
CO2: 25 mEq/L (ref 22–29)
CREATININE: 1.3 mg/dL — AB (ref 0.6–1.1)
Chloride: 109 mEq/L (ref 98–109)
EGFR: 50 mL/min/{1.73_m2} — ABNORMAL LOW (ref >90)
Glucose: 97 mg/dl (ref 70–140)
Potassium: 4.1 mEq/L (ref 3.5–5.1)
Sodium: 142 mEq/L (ref 136–145)
Total Protein: 7.2 g/dL (ref 6.4–8.3)

## 2015-12-11 LAB — CBC WITH DIFFERENTIAL/PLATELET
BASO%: 0.4 % (ref 0.0–2.0)
BASOS ABS: 0 10*3/uL (ref 0.0–0.1)
EOS%: 1.7 % (ref 0.0–7.0)
Eosinophils Absolute: 0.1 10*3/uL (ref 0.0–0.5)
HEMATOCRIT: 38.9 % (ref 34.8–46.6)
HEMOGLOBIN: 12.3 g/dL (ref 11.6–15.9)
LYMPH#: 0.8 10*3/uL — AB (ref 0.9–3.3)
LYMPH%: 14.8 % (ref 14.0–49.7)
MCH: 24.3 pg — ABNORMAL LOW (ref 25.1–34.0)
MCHC: 31.6 g/dL (ref 31.5–36.0)
MCV: 76.9 fL — ABNORMAL LOW (ref 79.5–101.0)
MONO#: 0.5 10*3/uL (ref 0.1–0.9)
MONO%: 8.8 % (ref 0.0–14.0)
NEUT#: 4 10*3/uL (ref 1.5–6.5)
NEUT%: 74.3 % (ref 38.4–76.8)
PLATELETS: 196 10*3/uL (ref 145–400)
RBC: 5.06 10*6/uL (ref 3.70–5.45)
RDW: 15.6 % — AB (ref 11.2–14.5)
WBC: 5.3 10*3/uL (ref 3.9–10.3)

## 2015-12-11 LAB — TSH: TSH: 0.987 m(IU)/L (ref 0.308–3.960)

## 2015-12-11 MED ORDER — SODIUM CHLORIDE 0.9 % IV SOLN
240.0000 mg | Freq: Once | INTRAVENOUS | Status: AC
  Administered 2015-12-11: 240 mg via INTRAVENOUS
  Filled 2015-12-11: qty 20

## 2015-12-11 MED ORDER — SODIUM CHLORIDE 0.9 % IV SOLN
Freq: Once | INTRAVENOUS | Status: AC
  Administered 2015-12-11: 12:00:00 via INTRAVENOUS

## 2015-12-11 MED ORDER — SODIUM CHLORIDE 0.9% FLUSH
10.0000 mL | INTRAVENOUS | Status: DC | PRN
  Administered 2015-12-11: 10 mL
  Filled 2015-12-11: qty 10

## 2015-12-11 MED ORDER — HEPARIN SOD (PORK) LOCK FLUSH 100 UNIT/ML IV SOLN
500.0000 [IU] | Freq: Once | INTRAVENOUS | Status: AC | PRN
  Administered 2015-12-11: 500 [IU]
  Filled 2015-12-11: qty 5

## 2015-12-11 MED ORDER — SODIUM CHLORIDE 0.9 % IJ SOLN
10.0000 mL | INTRAMUSCULAR | Status: DC | PRN
  Administered 2015-12-11: 10 mL via INTRAVENOUS
  Filled 2015-12-11: qty 10

## 2015-12-11 NOTE — Telephone Encounter (Signed)
Faxed pt records to [place]

## 2015-12-11 NOTE — Patient Instructions (Signed)

## 2015-12-11 NOTE — Patient Instructions (Signed)
[facility] Discharge Instructions   Today you received the following: Nivolumab  To help prevent nausea and vomiting after your treatment, we encourage you to take your nausea medication as directed.    If you develop nausea and vomiting that is not controlled by your nausea medication, call the clinic.   BELOW ARE SYMPTOMS THAT SHOULD BE REPORTED IMMEDIATELY:  *FEVER GREATER THAN 100.5 F  *CHILLS WITH OR WITHOUT FEVER  NAUSEA AND VOMITING THAT IS NOT CONTROLLED WITH YOUR NAUSEA MEDICATION  *UNUSUAL SHORTNESS OF BREATH  *UNUSUAL BRUISING OR BLEEDING  TENDERNESS IN MOUTH AND THROAT WITH OR WITHOUT PRESENCE OF ULCERS  *URINARY PROBLEMS  *BOWEL PROBLEMS  UNUSUAL RASH Items with * indicate a potential emergency and should be followed up as soon as possible.  Feel free to call the clinic you have any questions or concerns. The clinic phone number is [phone].  Please show the CHEMO ALERT CARD at check-in to the Emergency Department and triage nurse.   

## 2015-12-23 ENCOUNTER — Other Ambulatory Visit: Payer: Self-pay | Admitting: *Deleted

## 2015-12-23 ENCOUNTER — Encounter: Payer: Self-pay | Admitting: *Deleted

## 2015-12-23 NOTE — Patient Outreach (Addendum)
[facility]) Care Management  12/23/2015   Lauren [name] September 05, 1950 [id number]  Subjective: RN Health Coach telephone call to patient.  [employer] compliance verified. Per patient she is still receiving her iv chemo  infusion therapy.  RN noted a hacking type cough while talking with patient. Patient stated that she used the hycodan cough syrup but only at night because it makes her feel like she has a hang over. Per patient she only use 3 ml rather that the 5 ml because of the way it makes her feel. RN encouraged patient to talk with her physician about the cough medications and discuss with physician about  other medication that might not make her feel drowsy.Patient is still smoking but is in the green zone for now. Patient states she is decreasing her smoking.  Patient stated hat she is not exercising because of shortness of breath during exercise. Patient is still driving her car. So she has transportation for now. Patient doesn't feel her car will pass inspection and doesn't have the money for repairs. Patient has discussed this with the Education officer, museum.  Patient is using her inhalers and nebulizer as ordered. Per patient the Dr reported no new nodules on chest xray. Patient stated she is having some problem with getting fresh vegetables. RN discussed with patient about the out of garden mobile project. Patient remembered them being at the school behind her and did not attend. RN gave patient the number and she stated she would call for locations. RN discussed with patient about routine check ups and exams. Patient just went for new dentures.  Per patient stated that she is having a problem with hands peeling. She stated that she puts the cream on her hands but she has to wash it off constantly. Patient has agreed to follow up outreach calls.   Objective:   Current Medications:  Current Outpatient Prescriptions  Medication Sig Dispense Refill  . albuterol (PROVENTIL HFA;VENTOLIN  HFA) 108 (90 Base) MCG/ACT inhaler Inhale 1 puff into the lungs 2 (two) times daily. 6.7 g prn  . aspirin 81 MG chewable tablet Chew 81 mg by mouth daily.    Marland Kitchen BYSTOLIC 10 MG tablet TAKE 1 TABLET (10 MG TOTAL) BY MOUTH DAILY. 90 tablet 3  . CAPSAICIN EX Apply topically as needed. To feet    . cholecalciferol (VITAMIN D) 1000 units tablet Take 1,000 Units by mouth daily.    . clonazePAM (KLONOPIN) 0.5 MG tablet Take 0.5 mg by mouth 2 (two) times daily as needed. Reported on 08/28/2015    . COLACE 100 MG capsule Take 100 mg by mouth daily.    . diclofenac sodium (VOLTAREN) 1 % GEL Apply 2 g topically 4 (four) times daily. As needed for pain 100 g 3  . FLUoxetine (PROZAC) 20 MG tablet TAKE 1 TABLET EVERY DAY 90 tablet 0  . gabapentin (NEURONTIN) 300 MG capsule Take 1 capsule (300 mg total) by mouth 3 (three) times daily. (Patient taking differently: Take 300 mg by mouth daily. ) 90 capsule 1  . HYDROcodone-homatropine (HYCODAN) 5-1.5 MG/5ML syrup Take 5 mLs by mouth every 6 (six) hours as needed for cough. 240 mL 0  . lidocaine-prilocaine (EMLA) cream Apply topically as needed. Apply to porta cath site one hour prior to needle stick. 30 g 6  . omeprazole (PRILOSEC) 40 MG capsule TAKE 1 CAPSULE EVERY DAY 90 capsule prn  . potassium chloride SA (K-DUR,KLOR-CON) 20 MEQ tablet TAKE 1 TABLET TWICE DAILY 180 tablet  0  . pravastatin (PRAVACHOL) 40 MG tablet TAKE 1 TABLET ONE TIME DAILY 90 tablet 3  . tiZANidine (ZANAFLEX) 2 MG tablet Take 1 tablet (2 mg total) by mouth every 6 (six) hours as needed (Back Muscle Spasm). 30 tablet 3  . traMADol (ULTRAM) 50 MG tablet Take 1 tablet (50 mg total) by mouth every 6 (six) hours as needed for moderate pain or severe pain. 90 tablet 1  . triamcinolone ointment (KENALOG) 0.5 % Apply 1 application topically 2 (two) times daily. 30 g 0  . umeclidinium-vilanterol (ANORO ELLIPTA) 62.5-25 MCG/INH AEPB Inhale 1 puff into the lungs daily.    Marland Kitchen umeclidinium-vilanterol (ANORO  ELLIPTA) 62.5-25 MCG/INH AEPB Inhale 1 puff into the lungs daily. 1 each 0  . nitroGLYCERIN (NITROSTAT) 0.4 MG SL tablet Place 1 tablet (0.4 mg total) under the tongue every 5 (five) minutes as needed. For chest pain 25 tablet 5   No current facility-administered medications for this visit.    Facility-Administered Medications Ordered in Other Visits  Medication Dose Route Frequency Provider Last Rate Last Dose  . sodium chloride 0.9 % injection 10 mL  10 mL Intravenous PRN Heath Lark, MD   10 mL at 09/18/15 1345    Functional Status:  In your present state of health, do you have any difficulty performing the following activities: 12/23/2015 08/29/2015  Hearing? N N  Vision? N Y  Difficulty concentrating or making decisions? Y -  Walking or climbing stairs? Y -  Dressing or bathing? N -  Doing errands, shopping? Y -  Conservation officer, nature and eating ? N N  Using the Toilet? N N  In the past six months, have you accidently leaked urine? N Y  Do you have problems with loss of bowel control? N N  Managing your Medications? N N  Managing your Finances? N N  Housekeeping or managing your Housekeeping? N Y  Some recent data might be hidden    Fall/Depression Screening: PHQ 2/9 Scores 12/23/2015 11/26/2015 09/27/2015 09/26/2015 08/27/2015 08/01/2015 07/31/2015  PHQ - 2 Score 0 0 0 0 2 0 0  PHQ- 9 Score - 2 2 - 4 - -  Exception Documentation - - - - - - Patient refusal   THN CM Care Plan Problem One   Flowsheet Row Most Recent Value  Care Plan Problem One  knowledge deficit in self management of COPD  Role Documenting the Problem One  [place] for Problem One  Active  THN Long Term Goal (31-90 days)  Patient will not have any readmissions for COPD within the next 90 days  THN Long Term Goal Start Date  12/23/15  Interventions for Problem One Long Term Goal  RN reminded patient to keep appointments with PCP and oncology and pulmonary specialist. RN reminded patient to take medications as  per order.  THN CM Short Term Goal #1 (0-30 days)  Patient will report making appointment for follow up maintenance check ups within the next 30 days  THN CM Short Term Goal #1 Start Date  12/23/15  Interventions for Short Term Goal #1  RN discussed with patient the importance of getting eye exams ,dental check ups and routine care  THN CM Short Term Goal #2 (0-30 days)  Patient will report going to [employer] for fresh vegetables within the next 30 days  THN CM Short Term Goal #2 Start Date  12/23/15  Interventions for Short Term Goal #2  RN discussed with patient fresh  vegetable outreach programs. RN gave patient the number to call to get locations.  THN CM Short Term Goal #3 (0-30 days)  Patient will verbalize taking medications as per physician ordered  Interventions for Short Tern Goal #3  Patient discussed chronic cough but only takes medications at night. RN encouraged  patient to discuss with physician the rationale of not taking cough medication during the day.  THN CM Short Term Goal #4 (0-30 days)  Patient  wil verbalize that she is using the hand cream of peeling hands within the next 30 days  Interventions for Short Term Goal #4  RN discussed with patient about the gloves that she can put on hand with the hand cream. RN will follow up patient on effectiveness      Assessment:  Patient has a dry cough Patient is still smoking Patient went for dental appointment and have new dentures upper and lower Patient having difficulty affording fresh vegetables Patient has peeling skin on her hands but not using the hand creams as per order Patient will continue to benefit from [place] telephonic outreach for education and support for Copd self management.  Plan:  RN gave patient the number for the Out of Garden mobile project to contact for free vegetables RN discussed with patient about eye exam. Patient called RN back and set appointment for 12/30/2015 at 2:15  pm Patient will discuss with physician about cough medications RN will send patient a checklist for COPD RN discussed with patient about using the hand creams and the special hand gloves to use made especially for hand care RN will follow up with patient within the month of September  Shenee Wignall [place] Management [phone]

## 2015-12-25 ENCOUNTER — Ambulatory Visit (HOSPITAL_BASED_OUTPATIENT_CLINIC_OR_DEPARTMENT_OTHER): Payer: Commercial Managed Care - HMO | Admitting: Hematology and Oncology

## 2015-12-25 ENCOUNTER — Ambulatory Visit: Payer: Commercial Managed Care - HMO

## 2015-12-25 ENCOUNTER — Telehealth: Payer: Self-pay | Admitting: Hematology and Oncology

## 2015-12-25 ENCOUNTER — Ambulatory Visit (HOSPITAL_BASED_OUTPATIENT_CLINIC_OR_DEPARTMENT_OTHER): Payer: Commercial Managed Care - HMO

## 2015-12-25 ENCOUNTER — Other Ambulatory Visit (HOSPITAL_BASED_OUTPATIENT_CLINIC_OR_DEPARTMENT_OTHER): Payer: Commercial Managed Care - HMO

## 2015-12-25 ENCOUNTER — Encounter: Payer: Self-pay | Admitting: Hematology and Oncology

## 2015-12-25 DIAGNOSIS — Z79899 Other long term (current) drug therapy: Secondary | ICD-10-CM

## 2015-12-25 DIAGNOSIS — C3412 Malignant neoplasm of upper lobe, left bronchus or lung: Secondary | ICD-10-CM | POA: Diagnosis not present

## 2015-12-25 DIAGNOSIS — F17200 Nicotine dependence, unspecified, uncomplicated: Secondary | ICD-10-CM

## 2015-12-25 DIAGNOSIS — R600 Localized edema: Secondary | ICD-10-CM

## 2015-12-25 DIAGNOSIS — L271 Localized skin eruption due to drugs and medicaments taken internally: Secondary | ICD-10-CM

## 2015-12-25 DIAGNOSIS — Z5111 Encounter for antineoplastic chemotherapy: Secondary | ICD-10-CM

## 2015-12-25 DIAGNOSIS — E46 Unspecified protein-calorie malnutrition: Secondary | ICD-10-CM | POA: Diagnosis not present

## 2015-12-25 DIAGNOSIS — C7931 Secondary malignant neoplasm of brain: Secondary | ICD-10-CM

## 2015-12-25 DIAGNOSIS — C7949 Secondary malignant neoplasm of other parts of nervous system: Secondary | ICD-10-CM

## 2015-12-25 DIAGNOSIS — Z5112 Encounter for antineoplastic immunotherapy: Secondary | ICD-10-CM | POA: Diagnosis not present

## 2015-12-25 DIAGNOSIS — R05 Cough: Secondary | ICD-10-CM

## 2015-12-25 DIAGNOSIS — R21 Rash and other nonspecific skin eruption: Secondary | ICD-10-CM

## 2015-12-25 DIAGNOSIS — Z5181 Encounter for therapeutic drug level monitoring: Secondary | ICD-10-CM

## 2015-12-25 DIAGNOSIS — J449 Chronic obstructive pulmonary disease, unspecified: Secondary | ICD-10-CM

## 2015-12-25 LAB — CBC WITH DIFFERENTIAL/PLATELET
BASO%: 0.4 % (ref 0.0–2.0)
BASOS ABS: 0 10*3/uL (ref 0.0–0.1)
EOS%: 1.8 % (ref 0.0–7.0)
Eosinophils Absolute: 0.1 10*3/uL (ref 0.0–0.5)
HCT: 38.7 % (ref 34.8–46.6)
HGB: 12.3 g/dL (ref 11.6–15.9)
LYMPH%: 17.1 % (ref 14.0–49.7)
MCH: 24.1 pg — ABNORMAL LOW (ref 25.1–34.0)
MCHC: 31.8 g/dL (ref 31.5–36.0)
MCV: 75.9 fL — AB (ref 79.5–101.0)
MONO#: 0.5 10*3/uL (ref 0.1–0.9)
MONO%: 9.8 % (ref 0.0–14.0)
NEUT#: 3.5 10*3/uL (ref 1.5–6.5)
NEUT%: 70.9 % (ref 38.4–76.8)
NRBC: 0 % (ref 0–0)
Platelets: 174 10*3/uL (ref 145–400)
RBC: 5.1 10*6/uL (ref 3.70–5.45)
RDW: 16.1 % — AB (ref 11.2–14.5)
WBC: 4.9 10*3/uL (ref 3.9–10.3)
lymph#: 0.8 10*3/uL — ABNORMAL LOW (ref 0.9–3.3)

## 2015-12-25 LAB — COMPREHENSIVE METABOLIC PANEL
ALK PHOS: 143 U/L (ref 40–150)
ALT: <9 U/L (ref 0–55)
AST: 11 U/L (ref 5–34)
Albumin: 2.8 g/dL — ABNORMAL LOW (ref 3.5–5.0)
Anion Gap: 8 mEq/L (ref 3–11)
BILIRUBIN TOTAL: 0.47 mg/dL (ref 0.20–1.20)
BUN: 9.9 mg/dL (ref 7.0–26.0)
CHLORIDE: 109 meq/L (ref 98–109)
CO2: 25 meq/L (ref 22–29)
Calcium: 9.3 mg/dL (ref 8.4–10.4)
Creatinine: 1.2 mg/dL — ABNORMAL HIGH (ref 0.6–1.1)
EGFR: 55 mL/min/{1.73_m2} — AB (ref >90)
GLUCOSE: 98 mg/dL (ref 70–140)
POTASSIUM: 4 meq/L (ref 3.5–5.1)
SODIUM: 141 meq/L (ref 136–145)
TOTAL PROTEIN: 7 g/dL (ref 6.4–8.3)

## 2015-12-25 LAB — TSH: TSH: 1.158 m[IU]/L (ref 0.308–3.960)

## 2015-12-25 MED ORDER — HEPARIN SOD (PORK) LOCK FLUSH 100 UNIT/ML IV SOLN
500.0000 [IU] | Freq: Once | INTRAVENOUS | Status: AC | PRN
  Administered 2015-12-25: 500 [IU]
  Filled 2015-12-25: qty 5

## 2015-12-25 MED ORDER — SODIUM CHLORIDE 0.9 % IV SOLN
240.0000 mg | Freq: Once | INTRAVENOUS | Status: AC
  Administered 2015-12-25: 240 mg via INTRAVENOUS
  Filled 2015-12-25: qty 4

## 2015-12-25 MED ORDER — SODIUM CHLORIDE 0.9 % IJ SOLN
10.0000 mL | INTRAMUSCULAR | Status: DC | PRN
  Administered 2015-12-25: 10 mL via INTRAVENOUS
  Filled 2015-12-25: qty 10

## 2015-12-25 MED ORDER — SODIUM CHLORIDE 0.9% FLUSH
10.0000 mL | INTRAVENOUS | Status: DC | PRN
  Administered 2015-12-25: 10 mL
  Filled 2015-12-25: qty 10

## 2015-12-25 MED ORDER — SODIUM CHLORIDE 0.9 % IV SOLN
Freq: Once | INTRAVENOUS | Status: AC
  Administered 2015-12-25: 10:00:00 via INTRAVENOUS

## 2015-12-25 NOTE — Progress Notes (Signed)
[facility] OFFICE PROGRESS NOTE  Patient Care Team: [name], MD as PCP - General (Family Medicine) Gaye Pollack, MD (Cardiothoracic Surgery) Thea Silversmith, MD (Radiation Oncology) Dickie La, MD (Family Medicine) Heath Lark, MD as Consulting Physician (Hematology and Oncology) Renella Cunas, MD (Inactive) as Consulting Physician (Cardiology) Verlin Grills, RN as [facility] Management  SUMMARY OF ONCOLOGIC HISTORY: Oncology History   Colon cancer   Primary site: Colon and Rectum (Left)   Staging method: AJCC 7th Edition   Clinical: Stage I (T2, N0, M0) signed by Heath Lark, MD on 05/31/2013  2:39 PM   Pathologic: Stage I (T2, N0, cM0) signed by Heath Lark, MD on 05/31/2013  2:39 PM   Summary: Stage I (T2, N0, cM0) Lung cancer, EGFR/ALK negative, recurrence after initial resection to LN and brain   Primary site: Lung (Left)   Staging method: AJCC 7th Edition   Clinical: Stage IV (T1, N2, M1b) signed by Heath Lark, MD on 05/31/2013  2:26 PM   Pathologic: Stage IV (T1, N2, M1b) signed by Heath Lark, MD on 05/31/2013  2:26 PM   Summary: Stage IV (T1, N2, M1b)       Cancer of upper lobe of left lung, Adenocarcinoma   03/01/2007 Procedure    Colonoscopy revealed abnormalities and biopsy show high-grade dysplasia     04/01/2007 Surgery    She underwent sigmoid resection which showed T2 N0 colon cancer, and negative margins and all of 17 lymph nodes were negative     02/29/2008 Procedure    Repeat surveillance colonoscopy was negative.     06/16/2010 Surgery    She underwent left upper lobectomy we show well-differentiated adenocarcinoma of the lung, T1, N0, M0     03/18/2011 Procedure    Repeat colonoscopy show multiple polyps but there were benign     10/19/2011 Procedure    Biopsy of mediastinal lymph node came back positive for recurrence of lung cancer, EGFR and ALK negative     11/16/2011 - 12/14/2011 Chemotherapy    She  received concurrent chemoradiation therapy with weekly carboplatin and Taxol.     11/16/2011 - 12/29/2011 Radiation Therapy    She received radiation therapy with weekly chemotherapy     02/15/2012 Imaging    MR of the brain showed a new intracranial metastases. This was subsequently treated with stereotactic radiosurgery.     03/07/2012 - 04/12/2013 Chemotherapy    She received chemotherapy with maintainence Alimta every 3 weeks. Chemotherapy was discontinued due to profound fatigue     03/02/2013 Procedure    She had therapeutic ultrasound guidance thoracentesis for pleural effusion that came back negative for cancer     05/04/2013 Procedure    She had repeat ultrasound-guided thoracentesis again and cytology was negative     05/29/2013 Imaging    Repeat CT scan of the chest, abdomen and pelvis show no evidence of disease but persistent right-sided pleural effusion     06/02/2013 Surgery    The patient had placement of Pleurx catheter and subsequently underwent pleurodesis.     09/22/2013 Imaging    Repeat CT scan show no evidence of active disease. There are nonspecific lymphadenopathy and she is placed on observation.     03/23/2014 Imaging    Repeat CT scan of the chest, abdomen and pelvis show recurrence of cancer with widespread bilateral pulmonary metastasis.     05/14/2014 Imaging    Imaging of the chest and brain  were repeated due to delay of initiation of chemotherapy. Overall, chest CT scan show stable disease. MRI of the head was negative for recurrence     05/16/2014 - 07/18/2014 Chemotherapy     she completed 4 cycles of combination chemotherapy with carboplatin and Alimta     07/16/2014 Imaging     repeat CT scan of the chest, abdomen and pelvis show regression in the size of lung nodules.     08/29/2014 - 08/14/2015 Chemotherapy    She received maintenance Alimta     10/16/2014 Imaging     repeat CT scan of the chest, abdomen and pelvis show regression in the size of lung  nodules.     01/29/2015 Imaging    Repeat CT scan showed stable disease     02/01/2015 Imaging    MRI brain is negative     04/30/2015 Imaging    CT scan of the chest, abdomen and pelvis showed stable disease     07/25/2015 Imaging    MRI brain showed no evidence of new disease     09/09/2015 Imaging    CT chest showed interval increase in size of large RIGHT upper lobe nodule is most consistent with lung cancer recurrence.     09/18/2015 -  Chemotherapy    She started on Nivolumab     10/22/2015 Imaging    Screening mammogram showed mild abnormality     10/31/2015 Imaging    Diagnostic mammogram showed mild calcification at the right upper outer breast     11/27/2015 Imaging    Stable post treatment related changes of left lower lobectomy and radiation therapy redemonstrated, as above. No definite findings to suggest local recurrence of disease or metastatic disease on today's examination      Brain metastases treated with radiosurgery   02/26/2012 Initial Diagnosis    Brain metastases treated with radiosurgery      INTERVAL HISTORY: Please see below for problem oriented charting. She returns today to be seen prior to treatment. 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 She have chronic mild cough. She continues to smoke. No new neurological deficit. No new lymphadenopathy. Appetite is stable, denies recent weight loss  REVIEW OF SYSTEMS:   Constitutional: Denies fevers, chills or abnormal weight loss Eyes: Denies blurriness of vision Ears, nose, mouth, throat, and face: Denies mucositis or sore throat Cardiovascular: Denies palpitation, chest discomfort or lower extremity swelling Gastrointestinal:  Denies nausea, heartburn or change in bowel habits Lymphatics: Denies new lymphadenopathy or easy bruising Neurological:Denies numbness, tingling or new weaknesses Behavioral/Psych: Mood is stable, no new changes  All other systems were reviewed with the patient and are  negative.  I have reviewed the past medical history, past surgical history, social history and family history with the patient and they are unchanged from previous note.  ALLERGIES:  is allergic to adhesive [tape] and lisinopril.  MEDICATIONS:  Current Outpatient Prescriptions  Medication Sig Dispense Refill  . albuterol (PROVENTIL HFA;VENTOLIN HFA) 108 (90 Base) MCG/ACT inhaler Inhale 1 puff into the lungs 2 (two) times daily. 6.7 g prn  . aspirin 81 MG chewable tablet Chew 81 mg by mouth daily.    Marland Kitchen BYSTOLIC 10 MG tablet TAKE 1 TABLET (10 MG TOTAL) BY MOUTH DAILY. 90 tablet 3  . CAPSAICIN EX Apply topically as needed. To feet    . cholecalciferol (VITAMIN D) 1000 units tablet Take 1,000 Units by mouth daily.    . clonazePAM (KLONOPIN) 0.5 MG tablet Take 0.5 mg by mouth  2 (two) times daily as needed. Reported on 08/28/2015    . COLACE 100 MG capsule Take 100 mg by mouth daily.    . diclofenac sodium (VOLTAREN) 1 % GEL Apply 2 g topically 4 (four) times daily. As needed for pain 100 g 3  . FLUoxetine (PROZAC) 20 MG tablet TAKE 1 TABLET EVERY DAY 90 tablet 0  . gabapentin (NEURONTIN) 300 MG capsule Take 1 capsule (300 mg total) by mouth 3 (three) times daily. (Patient taking differently: Take 300 mg by mouth daily. ) 90 capsule 1  . HYDROcodone-homatropine (HYCODAN) 5-1.5 MG/5ML syrup Take 5 mLs by mouth every 6 (six) hours as needed for cough. 240 mL 0  . lidocaine-prilocaine (EMLA) cream Apply topically as needed. Apply to porta cath site one hour prior to needle stick. 30 g 6  . nitroGLYCERIN (NITROSTAT) 0.4 MG SL tablet Place 1 tablet (0.4 mg total) under the tongue every 5 (five) minutes as needed. For chest pain 25 tablet 5  . omeprazole (PRILOSEC) 40 MG capsule TAKE 1 CAPSULE EVERY DAY 90 capsule prn  . potassium chloride SA (K-DUR,KLOR-CON) 20 MEQ tablet TAKE 1 TABLET TWICE DAILY 180 tablet 0  . pravastatin (PRAVACHOL) 40 MG tablet TAKE 1 TABLET ONE TIME DAILY 90 tablet 3  . tiZANidine  (ZANAFLEX) 2 MG tablet Take 1 tablet (2 mg total) by mouth every 6 (six) hours as needed (Back Muscle Spasm). 30 tablet 3  . traMADol (ULTRAM) 50 MG tablet Take 1 tablet (50 mg total) by mouth every 6 (six) hours as needed for moderate pain or severe pain. 90 tablet 1  . triamcinolone ointment (KENALOG) 0.5 % Apply 1 application topically 2 (two) times daily. 30 g 0  . umeclidinium-vilanterol (ANORO ELLIPTA) 62.5-25 MCG/INH AEPB Inhale 1 puff into the lungs daily.    Marland Kitchen umeclidinium-vilanterol (ANORO ELLIPTA) 62.5-25 MCG/INH AEPB Inhale 1 puff into the lungs daily. 1 each 0   No current facility-administered medications for this visit.    Facility-Administered Medications Ordered in Other Visits  Medication Dose Route Frequency Provider Last Rate Last Dose  . 0.9 %  sodium chloride infusion   Intravenous Once Heath Lark, MD      . heparin lock flush 100 unit/mL  500 Units Intracatheter Once PRN Heath Lark, MD      . nivolumab (OPDIVO) 240 mg in sodium chloride 0.9 % 100 mL chemo infusion  240 mg Intravenous Once Heath Lark, MD      . sodium chloride 0.9 % injection 10 mL  10 mL Intravenous PRN Heath Lark, MD   10 mL at 09/18/15 1345  . sodium chloride flush (NS) 0.9 % injection 10 mL  10 mL Intracatheter PRN Heath Lark, MD        PHYSICAL EXAMINATION: ECOG PERFORMANCE STATUS: 1 - Symptomatic but completely ambulatory  Vitals:   12/25/15 0937  BP: (!) 152/78  Pulse: 77  Resp: 18  Temp: 98.1 F (36.7 C)   Filed Weights   12/25/15 0937  Weight: 161 lb 6.4 oz (73.2 kg)    GENERAL:alert, no distress and comfortable SKIN: noted mild hand foot syndrome EYES: normal, Conjunctiva are pink and non-injected, sclera clear OROPHARYNX:no exudate, no erythema and lips, buccal mucosa, and tongue normal  NECK: supple, thyroid normal size, non-tender, without nodularity LYMPH:  no palpable lymphadenopathy in the cervical, axillary or inguinal LUNGS: clear to auscultation and percussion with  normal breathing effort HEART: regular rate & rhythm and no murmurs with mild bilateral lower extremity  edema ABDOMEN:abdomen soft, non-tender and normal bowel sounds Musculoskeletal:no cyanosis of digits and no clubbing  NEURO: alert & oriented x 3 with fluent speech, no focal motor/sensory deficits  LABORATORY DATA:  I have reviewed the data as listed    Component Value Date/Time   NA 141 12/25/2015 0910   K 4.0 12/25/2015 0910   CL 106 12/26/2013 0946   CL 102 10/24/2012 1001   CO2 25 12/25/2015 0910   GLUCOSE 98 12/25/2015 0910   GLUCOSE 111 (H) 10/24/2012 1001   BUN 9.9 12/25/2015 0910   CREATININE 1.2 (H) 12/25/2015 0910   CALCIUM 9.3 12/25/2015 0910   PROT 7.0 12/25/2015 0910   ALBUMIN 2.8 (L) 12/25/2015 0910   AST 11 12/25/2015 0910   ALT <9 12/25/2015 0910   ALKPHOS 143 12/25/2015 0910   BILITOT 0.47 12/25/2015 0910   GFRNONAA 74 (L) 06/12/2013 0625   GFRAA 86 (L) 06/12/2013 0625    No results found for: SPEP, UPEP  Lab Results  Component Value Date   WBC 4.9 12/25/2015   NEUTROABS 3.5 12/25/2015   HGB 12.3 12/25/2015   HCT 38.7 12/25/2015   MCV 75.9 (L) 12/25/2015   PLT 174 12/25/2015      Chemistry      Component Value Date/Time   NA 141 12/25/2015 0910   K 4.0 12/25/2015 0910   CL 106 12/26/2013 0946   CL 102 10/24/2012 1001   CO2 25 12/25/2015 0910   BUN 9.9 12/25/2015 0910   CREATININE 1.2 (H) 12/25/2015 0910      Component Value Date/Time   CALCIUM 9.3 12/25/2015 0910   ALKPHOS 143 12/25/2015 0910   AST 11 12/25/2015 0910   ALT <9 12/25/2015 0910   BILITOT 0.47 12/25/2015 0910      ASSESSMENT & PLAN:  Cancer of upper lobe of left lung, Adenocarcinoma She has excellent response to treatment. Recent CT scan in July showed complete resolution of the right upper lung nodule. We will continue treatment every other week and I plan to recheck CT scan again in 3 months, due around October 2017  TOBACCO ABUSE I spent some time counseling the  patient the importance of tobacco cessation. She is currently not interested to quit now.    Skin rash It is very particular as it only affects her hands and her feet. She is quite asymptomatic. It has improved since I put her on topical cream. This is classic hand foot syndrome, mild Observe for now I recommend topical moisturizer We discussed possibility of prescribing prednisone but she declined that right now  COPD mixed type [facility]) She continues to have chronic cough. She continues to smoke. This is likely due to COPD. CT scan in July showed no evidence of pneumonia. She will continue cough suppressant as needed  Protein calorie malnutrition ([place]) There could be an element of depression. I recommend she takes Remeron consistently. She will continue to increase oral intake as tolerated.  Bilateral leg edema She has bilateral lower extremity edema, left greater than the right. This is likely related to low albumin with poor circulation. She had colon cancer and some of the lymph nodes were removed. Lasix is not helping, rightfully so since this is not congestive heart failure. The Lasix has caused significant hypotension and dizziness. Continue close monitoring only She is relatively asymptomatic   No orders of the defined types were placed in this encounter.  All questions were answered. The patient knows to call the clinic with any problems,  questions or concerns. No barriers to learning was detected. I spent 20 minutes counseling the patient face to face. The total time spent in the appointment was 25 minutes and more than 50% was on counseling and review of test results     [facility], [place], MD 12/25/2015 10:35 AM

## 2015-12-25 NOTE — Assessment & Plan Note (Signed)
She has excellent response to treatment. Recent CT scan in July showed complete resolution of the right upper lung nodule. We will continue treatment every other week and I plan to recheck CT scan again in 3 months, due around October 2017

## 2015-12-25 NOTE — Assessment & Plan Note (Signed)
She continues to have chronic cough. She continues to smoke. This is likely due to COPD. CT scan in July showed no evidence of pneumonia. She will continue cough suppressant as needed

## 2015-12-25 NOTE — Assessment & Plan Note (Signed)
It is very particular as it only affects her hands and her feet. She is quite asymptomatic. It has improved since I put her on topical cream. This is classic hand foot syndrome, mild Observe for now I recommend topical moisturizer We discussed possibility of prescribing prednisone but she declined that right now

## 2015-12-25 NOTE — Assessment & Plan Note (Signed)
I spent some time counseling the patient the importance of tobacco cessation. She is currently not interested to quit now. 

## 2015-12-25 NOTE — Assessment & Plan Note (Signed)
There could be an element of depression. I recommend she takes Remeron consistently. She will continue to increase oral intake as tolerated.

## 2015-12-25 NOTE — Telephone Encounter (Signed)
per pof to sch pt appt-gave pt copy of avs °

## 2015-12-25 NOTE — Patient Instructions (Signed)
[facility] Discharge Instructions   Today you received the following: Nivolumab  To help prevent nausea and vomiting after your treatment, we encourage you to take your nausea medication as directed.    If you develop nausea and vomiting that is not controlled by your nausea medication, call the clinic.   BELOW ARE SYMPTOMS THAT SHOULD BE REPORTED IMMEDIATELY:  *FEVER GREATER THAN 100.5 F  *CHILLS WITH OR WITHOUT FEVER  NAUSEA AND VOMITING THAT IS NOT CONTROLLED WITH YOUR NAUSEA MEDICATION  *UNUSUAL SHORTNESS OF BREATH  *UNUSUAL BRUISING OR BLEEDING  TENDERNESS IN MOUTH AND THROAT WITH OR WITHOUT PRESENCE OF ULCERS  *URINARY PROBLEMS  *BOWEL PROBLEMS  UNUSUAL RASH Items with * indicate a potential emergency and should be followed up as soon as possible.  Feel free to call the clinic you have any questions or concerns. The clinic phone number is [phone].  Please show the CHEMO ALERT CARD at check-in to the Emergency Department and triage nurse.   

## 2015-12-25 NOTE — Assessment & Plan Note (Signed)
She has bilateral lower extremity edema, left greater than the right. This is likely related to low albumin with poor circulation. She had colon cancer and some of the lymph nodes were removed. Lasix is not helping, rightfully so since this is not congestive heart failure. The Lasix has caused significant hypotension and dizziness. Continue close monitoring only She is relatively asymptomatic

## 2015-12-25 NOTE — Patient Instructions (Signed)

## 2016-01-01 DIAGNOSIS — Z0100 Encounter for examination of eyes and vision without abnormal findings: Secondary | ICD-10-CM | POA: Diagnosis not present

## 2016-01-01 DIAGNOSIS — H5210 Myopia, unspecified eye: Secondary | ICD-10-CM | POA: Diagnosis not present

## 2016-01-01 DIAGNOSIS — H524 Presbyopia: Secondary | ICD-10-CM | POA: Diagnosis not present

## 2016-01-01 DIAGNOSIS — H52209 Unspecified astigmatism, unspecified eye: Secondary | ICD-10-CM | POA: Diagnosis not present

## 2016-01-01 DIAGNOSIS — H5203 Hypermetropia, bilateral: Secondary | ICD-10-CM | POA: Diagnosis not present

## 2016-01-01 DIAGNOSIS — H5213 Myopia, bilateral: Secondary | ICD-10-CM | POA: Diagnosis not present

## 2016-01-07 ENCOUNTER — Other Ambulatory Visit: Payer: Self-pay | Admitting: *Deleted

## 2016-01-07 NOTE — Patient Outreach (Signed)
[facility]) Care Management  01/07/2016  Lauren [name] Jul 17, 1950 [id number]    RN Health Coach received telephone call from patient.  [employer] compliance verified. Per patient she had a meltdown last week. Patient stated she has so many things  running together with cancer and and her sons illness. Patient wanted to go back to a therapist she had seen in the past. But she could only remember her first name as [name].  RN Health Coach looked up Dr name and gave to patient to call and set up appointment for therapy. Patient has no suicidal ideation. Patient wanting to get back into therapy. RN also offered to have social worker assist with a therapist if patient unable to reach Dr Kyra Leyland.  [facility] Care Management [phone]

## 2016-01-08 ENCOUNTER — Ambulatory Visit (HOSPITAL_BASED_OUTPATIENT_CLINIC_OR_DEPARTMENT_OTHER): Payer: Commercial Managed Care - HMO

## 2016-01-08 ENCOUNTER — Ambulatory Visit: Payer: Commercial Managed Care - HMO

## 2016-01-08 ENCOUNTER — Other Ambulatory Visit (HOSPITAL_BASED_OUTPATIENT_CLINIC_OR_DEPARTMENT_OTHER): Payer: Commercial Managed Care - HMO

## 2016-01-08 DIAGNOSIS — Z5112 Encounter for antineoplastic immunotherapy: Secondary | ICD-10-CM | POA: Diagnosis not present

## 2016-01-08 DIAGNOSIS — C7931 Secondary malignant neoplasm of brain: Secondary | ICD-10-CM

## 2016-01-08 DIAGNOSIS — Z5181 Encounter for therapeutic drug level monitoring: Secondary | ICD-10-CM

## 2016-01-08 DIAGNOSIS — C3412 Malignant neoplasm of upper lobe, left bronchus or lung: Secondary | ICD-10-CM

## 2016-01-08 DIAGNOSIS — C7949 Secondary malignant neoplasm of other parts of nervous system: Secondary | ICD-10-CM

## 2016-01-08 DIAGNOSIS — Z5111 Encounter for antineoplastic chemotherapy: Secondary | ICD-10-CM

## 2016-01-08 DIAGNOSIS — Z79899 Other long term (current) drug therapy: Secondary | ICD-10-CM

## 2016-01-08 LAB — COMPREHENSIVE METABOLIC PANEL
ALBUMIN: 2.7 g/dL — AB (ref 3.5–5.0)
ALK PHOS: 126 U/L (ref 40–150)
ALT: <9 U/L (ref 0–55)
AST: 11 U/L (ref 5–34)
Anion Gap: 8 mEq/L (ref 3–11)
BUN: 11.1 mg/dL (ref 7.0–26.0)
CALCIUM: 9.2 mg/dL (ref 8.4–10.4)
CO2: 25 mEq/L (ref 22–29)
CREATININE: 1.1 mg/dL (ref 0.6–1.1)
Chloride: 110 mEq/L — ABNORMAL HIGH (ref 98–109)
EGFR: 59 mL/min/{1.73_m2} — ABNORMAL LOW (ref >90)
Glucose: 124 mg/dl (ref 70–140)
POTASSIUM: 3.9 meq/L (ref 3.5–5.1)
Sodium: 143 mEq/L (ref 136–145)
Total Bilirubin: 0.35 mg/dL (ref 0.20–1.20)
Total Protein: 6.9 g/dL (ref 6.4–8.3)

## 2016-01-08 LAB — CBC WITH DIFFERENTIAL/PLATELET
BASO%: 0.2 % (ref 0.0–2.0)
BASOS ABS: 0 10*3/uL (ref 0.0–0.1)
EOS ABS: 0.1 10*3/uL (ref 0.0–0.5)
EOS%: 1.5 % (ref 0.0–7.0)
HEMATOCRIT: 38.4 % (ref 34.8–46.6)
HEMOGLOBIN: 12.1 g/dL (ref 11.6–15.9)
LYMPH#: 1 10*3/uL (ref 0.9–3.3)
LYMPH%: 19 % (ref 14.0–49.7)
MCH: 24 pg — AB (ref 25.1–34.0)
MCHC: 31.5 g/dL (ref 31.5–36.0)
MCV: 76 fL — ABNORMAL LOW (ref 79.5–101.0)
MONO#: 0.4 10*3/uL (ref 0.1–0.9)
MONO%: 7.2 % (ref 0.0–14.0)
NEUT#: 3.8 10*3/uL (ref 1.5–6.5)
NEUT%: 72.1 % (ref 38.4–76.8)
Platelets: 178 10*3/uL (ref 145–400)
RBC: 5.05 10*6/uL (ref 3.70–5.45)
RDW: 16.5 % — AB (ref 11.2–14.5)
WBC: 5.3 10*3/uL (ref 3.9–10.3)

## 2016-01-08 LAB — TSH: TSH: 1.094 m[IU]/L (ref 0.308–3.960)

## 2016-01-08 MED ORDER — SODIUM CHLORIDE 0.9 % IV SOLN
240.0000 mg | Freq: Once | INTRAVENOUS | Status: AC
  Administered 2016-01-08: 240 mg via INTRAVENOUS
  Filled 2016-01-08: qty 4

## 2016-01-08 MED ORDER — SODIUM CHLORIDE 0.9 % IJ SOLN
10.0000 mL | INTRAMUSCULAR | Status: DC | PRN
  Administered 2016-01-08: 10 mL via INTRAVENOUS
  Filled 2016-01-08: qty 10

## 2016-01-08 MED ORDER — SODIUM CHLORIDE 0.9% FLUSH
10.0000 mL | INTRAVENOUS | Status: DC | PRN
  Administered 2016-01-08: 10 mL
  Filled 2016-01-08: qty 10

## 2016-01-08 MED ORDER — HEPARIN SOD (PORK) LOCK FLUSH 100 UNIT/ML IV SOLN
500.0000 [IU] | Freq: Once | INTRAVENOUS | Status: AC | PRN
  Administered 2016-01-08: 500 [IU]
  Filled 2016-01-08: qty 5

## 2016-01-08 MED ORDER — SODIUM CHLORIDE 0.9 % IV SOLN
Freq: Once | INTRAVENOUS | Status: AC
  Administered 2016-01-08: 13:00:00 via INTRAVENOUS

## 2016-01-08 NOTE — Patient Instructions (Signed)
Nivolumab injection What is this medicine? NIVOLUMAB (nye VOL ue mab) is a monoclonal antibody. It is used to treat melanoma, lung cancer, kidney cancer, and Hodgkin lymphoma. This medicine may be used for other purposes; ask your health care provider or pharmacist if you have questions. What should I tell my health care provider before I take this medicine? They need to know if you have any of these conditions: -diabetes -immune system problems -kidney disease -liver disease -lung disease -organ transplant -stomach or intestine problems -thyroid disease -an unusual or allergic reaction to nivolumab, other medicines, foods, dyes, or preservatives -pregnant or trying to get pregnant -breast-feeding How should I use this medicine? This medicine is for infusion into a vein. It is given by a health care professional in a hospital or clinic setting. A special MedGuide will be given to you before each treatment. Be sure to read this information carefully each time. Talk to your pediatrician regarding the use of this medicine in children. Special care may be needed. Overdosage: If you think you have taken too much of this medicine contact a poison control center or emergency room at once. NOTE: This medicine is only for you. Do not share this medicine with others. What if I miss a dose? It is important not to miss your dose. Call your doctor or health care professional if you are unable to keep an appointment. What may interact with this medicine? Interactions have not been studied. Give your health care provider a list of all the medicines, herbs, non-prescription drugs, or dietary supplements you use. Also tell them if you smoke, drink alcohol, or use illegal drugs. Some items may interact with your medicine. This list may not describe all possible interactions. Give your health care provider a list of all the medicines, herbs, non-prescription drugs, or dietary supplements you use. Also tell  them if you smoke, drink alcohol, or use illegal drugs. Some items may interact with your medicine. What should I watch for while using this medicine? This drug may make you feel generally unwell. Continue your course of treatment even though you feel ill unless your doctor tells you to stop. You may need blood work done while you are taking this medicine. Do not become pregnant while taking this medicine or for 5 months after stopping it. Women should inform their doctor if they wish to become pregnant or think they might be pregnant. There is a potential for serious side effects to an unborn child. Talk to your health care professional or pharmacist for more information. Do not breast-feed an infant while taking this medicine. What side effects may I notice from receiving this medicine? Side effects that you should report to your doctor or health care professional as soon as possible: -allergic reactions like skin rash, itching or hives, swelling of the face, lips, or tongue -black, tarry stools -blood in the urine -bloody or watery diarrhea -changes in vision -change in sex drive -changes in emotions or moods -chest pain -confusion -cough -decreased appetite -diarrhea -facial flushing -feeling faint or lightheaded -fever, chills -hair loss -hallucination, loss of contact with reality -headache -irritable -joint pain -loss of memory -muscle pain -muscle weakness -seizures -shortness of breath -signs and symptoms of high blood sugar such as dizziness; dry mouth; dry skin; fruity breath; nausea; stomach pain; increased hunger or thirst; increased urination -signs and symptoms of kidney injury like trouble passing urine or change in the amount of urine -signs and symptoms of liver injury like dark yellow or  brown urine; general ill feeling or flu-like symptoms; light-colored stools; loss of appetite; nausea; right upper belly pain; unusually weak or tired; yellowing of the eyes or  skin -stiff neck -swelling of the ankles, feet, hands -weight gain Side effects that usually do not require medical attention (report to your doctor or health care professional if they continue or are bothersome): -bone pain -constipation -tiredness -vomiting This list may not describe all possible side effects. Call your doctor for medical advice about side effects. You may report side effects to FDA at 1-800-FDA-1088. Where should I keep my medicine? This drug is given in a hospital or clinic and will not be stored at home. NOTE: This sheet is a summary. It may not cover all possible information. If you have questions about this medicine, talk to your doctor, pharmacist, or health care provider.    2016, Elsevier/Gold Standard. (2014-10-03 10:03:42)

## 2016-01-22 ENCOUNTER — Ambulatory Visit (HOSPITAL_BASED_OUTPATIENT_CLINIC_OR_DEPARTMENT_OTHER): Payer: Commercial Managed Care - HMO | Admitting: Hematology and Oncology

## 2016-01-22 ENCOUNTER — Other Ambulatory Visit (HOSPITAL_BASED_OUTPATIENT_CLINIC_OR_DEPARTMENT_OTHER): Payer: Commercial Managed Care - HMO

## 2016-01-22 ENCOUNTER — Ambulatory Visit: Payer: Commercial Managed Care - HMO

## 2016-01-22 ENCOUNTER — Ambulatory Visit (HOSPITAL_BASED_OUTPATIENT_CLINIC_OR_DEPARTMENT_OTHER): Payer: Commercial Managed Care - HMO

## 2016-01-22 ENCOUNTER — Encounter: Payer: Self-pay | Admitting: Hematology and Oncology

## 2016-01-22 ENCOUNTER — Telehealth: Payer: Self-pay | Admitting: Hematology and Oncology

## 2016-01-22 VITALS — BP 148/82 | HR 90 | Temp 98.6°F | Resp 18 | Wt 169.5 lb

## 2016-01-22 DIAGNOSIS — Z5112 Encounter for antineoplastic immunotherapy: Secondary | ICD-10-CM | POA: Diagnosis not present

## 2016-01-22 DIAGNOSIS — E46 Unspecified protein-calorie malnutrition: Secondary | ICD-10-CM

## 2016-01-22 DIAGNOSIS — C3412 Malignant neoplasm of upper lobe, left bronchus or lung: Secondary | ICD-10-CM

## 2016-01-22 DIAGNOSIS — C7949 Secondary malignant neoplasm of other parts of nervous system: Principal | ICD-10-CM

## 2016-01-22 DIAGNOSIS — C7931 Secondary malignant neoplasm of brain: Secondary | ICD-10-CM

## 2016-01-22 DIAGNOSIS — J449 Chronic obstructive pulmonary disease, unspecified: Secondary | ICD-10-CM

## 2016-01-22 DIAGNOSIS — F17200 Nicotine dependence, unspecified, uncomplicated: Secondary | ICD-10-CM

## 2016-01-22 DIAGNOSIS — R21 Rash and other nonspecific skin eruption: Secondary | ICD-10-CM

## 2016-01-22 DIAGNOSIS — Z79899 Other long term (current) drug therapy: Secondary | ICD-10-CM

## 2016-01-22 DIAGNOSIS — Z5111 Encounter for antineoplastic chemotherapy: Secondary | ICD-10-CM

## 2016-01-22 DIAGNOSIS — R600 Localized edema: Secondary | ICD-10-CM

## 2016-01-22 DIAGNOSIS — Z5181 Encounter for therapeutic drug level monitoring: Secondary | ICD-10-CM

## 2016-01-22 LAB — CBC WITH DIFFERENTIAL/PLATELET
BASO%: 0.9 % (ref 0.0–2.0)
Basophils Absolute: 0 10*3/uL (ref 0.0–0.1)
EOS%: 2 % (ref 0.0–7.0)
Eosinophils Absolute: 0.1 10*3/uL (ref 0.0–0.5)
HEMATOCRIT: 38.3 % (ref 34.8–46.6)
HGB: 12.1 g/dL (ref 11.6–15.9)
LYMPH#: 0.8 10*3/uL — AB (ref 0.9–3.3)
LYMPH%: 14 % (ref 14.0–49.7)
MCH: 23.2 pg — ABNORMAL LOW (ref 25.1–34.0)
MCHC: 31.6 g/dL (ref 31.5–36.0)
MCV: 73.3 fL — ABNORMAL LOW (ref 79.5–101.0)
MONO#: 0.6 10*3/uL (ref 0.1–0.9)
MONO%: 10.7 % (ref 0.0–14.0)
NEUT#: 3.9 10*3/uL (ref 1.5–6.5)
NEUT%: 72.4 % (ref 38.4–76.8)
Platelets: 176 10*3/uL (ref 145–400)
RBC: 5.22 10*6/uL (ref 3.70–5.45)
RDW: 17.9 % — ABNORMAL HIGH (ref 11.2–14.5)
WBC: 5.4 10*3/uL (ref 3.9–10.3)

## 2016-01-22 LAB — COMPREHENSIVE METABOLIC PANEL
AST: 13 U/L (ref 5–34)
Albumin: 2.7 g/dL — ABNORMAL LOW (ref 3.5–5.0)
Alkaline Phosphatase: 131 U/L (ref 40–150)
Anion Gap: 8 mEq/L (ref 3–11)
BUN: 17.1 mg/dL (ref 7.0–26.0)
CALCIUM: 9.1 mg/dL (ref 8.4–10.4)
CHLORIDE: 108 meq/L (ref 98–109)
CO2: 26 meq/L (ref 22–29)
CREATININE: 1.4 mg/dL — AB (ref 0.6–1.1)
EGFR: 46 mL/min/{1.73_m2} — ABNORMAL LOW (ref >90)
GLUCOSE: 100 mg/dL (ref 70–140)
Potassium: 4.2 mEq/L (ref 3.5–5.1)
Sodium: 142 mEq/L (ref 136–145)
Total Bilirubin: 0.31 mg/dL (ref 0.20–1.20)
Total Protein: 6.9 g/dL (ref 6.4–8.3)

## 2016-01-22 LAB — TSH: TSH: 2.566 m(IU)/L (ref 0.308–3.960)

## 2016-01-22 MED ORDER — SODIUM CHLORIDE 0.9% FLUSH
10.0000 mL | INTRAVENOUS | Status: DC | PRN
  Administered 2016-01-22: 10 mL
  Filled 2016-01-22: qty 10

## 2016-01-22 MED ORDER — HEPARIN SOD (PORK) LOCK FLUSH 100 UNIT/ML IV SOLN
500.0000 [IU] | Freq: Once | INTRAVENOUS | Status: AC | PRN
  Administered 2016-01-22: 500 [IU]
  Filled 2016-01-22: qty 5

## 2016-01-22 MED ORDER — SODIUM CHLORIDE 0.9 % IJ SOLN
10.0000 mL | INTRAMUSCULAR | Status: DC | PRN
  Administered 2016-01-22: 10 mL via INTRAVENOUS
  Filled 2016-01-22: qty 10

## 2016-01-22 MED ORDER — PREDNISONE 20 MG PO TABS: 20.0000 mg | ORAL_TABLET | Freq: Every day | ORAL | 0 refills | Status: DC

## 2016-01-22 MED ORDER — SODIUM CHLORIDE 0.9 % IV SOLN
Freq: Once | INTRAVENOUS | Status: AC
  Administered 2016-01-22: 11:00:00 via INTRAVENOUS

## 2016-01-22 MED ORDER — NIVOLUMAB CHEMO INJECTION 100 MG/10ML
240.0000 mg | Freq: Once | INTRAVENOUS | Status: AC
  Administered 2016-01-22: 240 mg via INTRAVENOUS
  Filled 2016-01-22: qty 20

## 2016-01-22 MED FILL — predniSONE 20 MG TABS: 20 | 10 days supply | Qty: 10 | Fill #0

## 2016-01-22 NOTE — Patient Instructions (Signed)

## 2016-01-22 NOTE — Assessment & Plan Note (Signed)
There could be an element of depression. I recommend she takes Remeron consistently. She will continue to increase oral intake as tolerated.

## 2016-01-22 NOTE — Assessment & Plan Note (Signed)
The patient has COPD exacerbation from bronchitis. I recommend short course prednisone daily. She agreed to try

## 2016-01-22 NOTE — Assessment & Plan Note (Signed)
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 Continue to monitor carefully.

## 2016-01-22 NOTE — Assessment & Plan Note (Signed)
She has excellent response to treatment. Recent CT scan in July showed complete resolution of the right upper lung nodule. 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

## 2016-01-22 NOTE — Assessment & Plan Note (Signed)
She has bilateral lower extremity edema, left greater than the right. This is likely related to low albumin with poor circulation. She had colon cancer and some of the lymph nodes were removed. Lasix is not helping, rightfully so since this is not congestive heart failure. The Lasix has caused significant hypotension and dizziness. Continue close monitoring only She is relatively asymptomatic

## 2016-01-22 NOTE — Assessment & Plan Note (Signed)
I spent some time counseling the patient the importance of tobacco cessation. She is currently not interested to quit now. 

## 2016-01-22 NOTE — Assessment & Plan Note (Signed)
It is very peculiar as it only affects her hands and her feet. She is quite asymptomatic. It has improved since I put her on topical cream. 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 I will like to see if her symptoms improved while on prednisone therapy. I recommend we space out her treatment every 3 weeks and she agreed

## 2016-01-22 NOTE — Patient Instructions (Signed)
[facility] Discharge Instructions for Patients Receiving Chemotherapy  Today you received the following chemotherapy agents:  Opdivo (nivolumab)  To help prevent nausea and vomiting after your treatment, we encourage you to take your nausea medication as prescribed.   If you develop nausea and vomiting that is not controlled by your nausea medication, call the clinic.   BELOW ARE SYMPTOMS THAT SHOULD BE REPORTED IMMEDIATELY:  *FEVER GREATER THAN 100.5 F  *CHILLS WITH OR WITHOUT FEVER  NAUSEA AND VOMITING THAT IS NOT CONTROLLED WITH YOUR NAUSEA MEDICATION  *UNUSUAL SHORTNESS OF BREATH  *UNUSUAL BRUISING OR BLEEDING  TENDERNESS IN MOUTH AND THROAT WITH OR WITHOUT PRESENCE OF ULCERS  *URINARY PROBLEMS  *BOWEL PROBLEMS  UNUSUAL RASH Items with * indicate a potential emergency and should be followed up as soon as possible.  Feel free to call the clinic you have any questions or concerns. The clinic phone number is [phone].  Please show the [place] at check-in to the Emergency Department and triage nurse.

## 2016-01-22 NOTE — Telephone Encounter (Signed)
Gave patient avs report and appointments for September and October  °

## 2016-01-22 NOTE — Progress Notes (Signed)
[facility] OFFICE PROGRESS NOTE  Patient Care Team: [name], MD as PCP - General (Family Medicine) Gaye Pollack, MD (Cardiothoracic Surgery) Thea Silversmith, MD (Radiation Oncology) Dickie La, MD (Family Medicine) Heath Lark, MD as Consulting Physician (Hematology and Oncology) Renella Cunas, MD (Inactive) as Consulting Physician (Cardiology) Verlin Grills, RN as [place] Management  SUMMARY OF ONCOLOGIC HISTORY: Oncology History   Colon cancer   Primary site: Colon and Rectum (Left)   Staging method: AJCC 7th Edition   Clinical: Stage I (T2, N0, M0) signed by Heath Lark, MD on 05/31/2013  2:39 PM   Pathologic: Stage I (T2, N0, cM0) signed by Heath Lark, MD on 05/31/2013  2:39 PM   Summary: Stage I (T2, N0, cM0) Lung cancer, EGFR/ALK negative, recurrence after initial resection to LN and brain   Primary site: Lung (Left)   Staging method: AJCC 7th Edition   Clinical: Stage IV (T1, N2, M1b) signed by Heath Lark, MD on 05/31/2013  2:26 PM   Pathologic: Stage IV (T1, N2, M1b) signed by Heath Lark, MD on 05/31/2013  2:26 PM   Summary: Stage IV (T1, N2, M1b)       Cancer of upper lobe of left lung, Adenocarcinoma   03/01/2007 Procedure    Colonoscopy revealed abnormalities and biopsy show high-grade dysplasia      04/01/2007 Surgery    She underwent sigmoid resection which showed T2 N0 colon cancer, and negative margins and all of 17 lymph nodes were negative      02/29/2008 Procedure    Repeat surveillance colonoscopy was negative.      06/16/2010 Surgery    She underwent left upper lobectomy we show well-differentiated adenocarcinoma of the lung, T1, N0, M0      03/18/2011 Procedure    Repeat colonoscopy show multiple polyps but there were benign      10/19/2011 Procedure    Biopsy of mediastinal lymph node came back positive for recurrence of lung cancer, EGFR and ALK negative      11/16/2011 - 12/14/2011 Chemotherapy     She received concurrent chemoradiation therapy with weekly carboplatin and Taxol.      11/16/2011 - 12/29/2011 Radiation Therapy    She received radiation therapy with weekly chemotherapy      02/15/2012 Imaging    MR of the brain showed a new intracranial metastases. This was subsequently treated with stereotactic radiosurgery.      03/07/2012 - 04/12/2013 Chemotherapy    She received chemotherapy with maintainence Alimta every 3 weeks. Chemotherapy was discontinued due to profound fatigue      03/02/2013 Procedure    She had therapeutic ultrasound guidance thoracentesis for pleural effusion that came back negative for cancer      05/04/2013 Procedure    She had repeat ultrasound-guided thoracentesis again and cytology was negative      05/29/2013 Imaging    Repeat CT scan of the chest, abdomen and pelvis show no evidence of disease but persistent right-sided pleural effusion      06/02/2013 Surgery    The patient had placement of Pleurx catheter and subsequently underwent pleurodesis.      09/22/2013 Imaging    Repeat CT scan show no evidence of active disease. There are nonspecific lymphadenopathy and she is placed on observation.      03/23/2014 Imaging    Repeat CT scan of the chest, abdomen and pelvis show recurrence of cancer with widespread bilateral pulmonary metastasis.  05/14/2014 Imaging    Imaging of the chest and brain were repeated due to delay of initiation of chemotherapy. Overall, chest CT scan show stable disease. MRI of the head was negative for recurrence      05/16/2014 - 07/18/2014 Chemotherapy     she completed 4 cycles of combination chemotherapy with carboplatin and Alimta      07/16/2014 Imaging     repeat CT scan of the chest, abdomen and pelvis show regression in the size of lung nodules.      08/29/2014 - 08/14/2015 Chemotherapy    She received maintenance Alimta      10/16/2014 Imaging     repeat CT scan of the chest, abdomen and pelvis  show regression in the size of lung nodules.      01/29/2015 Imaging    Repeat CT scan showed stable disease      02/01/2015 Imaging    MRI brain is negative      04/30/2015 Imaging    CT scan of the chest, abdomen and pelvis showed stable disease      07/25/2015 Imaging    MRI brain showed no evidence of new disease      09/09/2015 Imaging    CT chest showed interval increase in size of large RIGHT upper lobe nodule is most consistent with lung cancer recurrence.      09/18/2015 -  Chemotherapy    She started on Nivolumab      10/22/2015 Imaging    Screening mammogram showed mild abnormality      10/31/2015 Imaging    Diagnostic mammogram showed mild calcification at the right upper outer breast      11/27/2015 Imaging    Stable post treatment related changes of left lower lobectomy and radiation therapy redemonstrated, as above. No definite findings to suggest local recurrence of disease or metastatic disease on today's examination       Brain metastases treated with radiosurgery   02/26/2012 Initial Diagnosis    Brain metastases treated with radiosurgery       INTERVAL HISTORY: Please see below for problem oriented charting. She returns prior to treatment. She continues to complain of hand foot syndrome that bother hers more. She have chronic productive cough of white to yellow sputum. Denies fever or chills. Denies hemoptysis. Denies neurological deficit. No mucositis, nausea or change in bowel habits She continues to smoke and unwilling to quit  REVIEW OF SYSTEMS:   Constitutional: Denies fevers, chills or abnormal weight loss Eyes: Denies blurriness of vision Ears, nose, mouth, throat, and face: Denies mucositis or sore throat Cardiovascular: Denies palpitation, chest discomfort or lower extremity swelling Gastrointestinal:  Denies nausea, heartburn or change in bowel habits Lymphatics: Denies new lymphadenopathy or easy bruising Neurological:Denies  numbness, tingling or new weaknesses Behavioral/Psych: Mood is stable, no new changes  All other systems were reviewed with the patient and are negative.  I have reviewed the past medical history, past surgical history, social history and family history with the patient and they are unchanged from previous note.  ALLERGIES:  is allergic to adhesive [tape] and lisinopril.  MEDICATIONS:  Current Outpatient Prescriptions  Medication Sig Dispense Refill  . albuterol (PROVENTIL HFA;VENTOLIN HFA) 108 (90 Base) MCG/ACT inhaler Inhale 1 puff into the lungs 2 (two) times daily. 6.7 g prn  . aspirin 81 MG chewable tablet Chew 81 mg by mouth daily.    Marland Kitchen BYSTOLIC 10 MG tablet TAKE 1 TABLET (10 MG TOTAL) BY MOUTH DAILY. 90 tablet 3  .  CAPSAICIN EX Apply topically as needed. To feet    . cholecalciferol (VITAMIN D) 1000 units tablet Take 1,000 Units by mouth daily.    . clonazePAM (KLONOPIN) 0.5 MG tablet Take 0.5 mg by mouth 2 (two) times daily as needed. Reported on 08/28/2015    . COLACE 100 MG capsule Take 100 mg by mouth daily.    . diclofenac sodium (VOLTAREN) 1 % GEL Apply 2 g topically 4 (four) times daily. As needed for pain 100 g 3  . FLUoxetine (PROZAC) 20 MG tablet TAKE 1 TABLET EVERY DAY 90 tablet 0  . gabapentin (NEURONTIN) 300 MG capsule Take 1 capsule (300 mg total) by mouth 3 (three) times daily. (Patient taking differently: Take 300 mg by mouth daily. ) 90 capsule 1  . HYDROcodone-homatropine (HYCODAN) 5-1.5 MG/5ML syrup Take 5 mLs by mouth every 6 (six) hours as needed for cough. 240 mL 0  . lidocaine-prilocaine (EMLA) cream Apply topically as needed. Apply to porta cath site one hour prior to needle stick. 30 g 6  . nitroGLYCERIN (NITROSTAT) 0.4 MG SL tablet Place 1 tablet (0.4 mg total) under the tongue every 5 (five) minutes as needed. For chest pain 25 tablet 5  . omeprazole (PRILOSEC) 40 MG capsule TAKE 1 CAPSULE EVERY DAY 90 capsule prn  . potassium chloride SA (K-DUR,KLOR-CON) 20  MEQ tablet TAKE 1 TABLET TWICE DAILY 180 tablet 0  . pravastatin (PRAVACHOL) 40 MG tablet TAKE 1 TABLET ONE TIME DAILY 90 tablet 3  . tiZANidine (ZANAFLEX) 2 MG tablet Take 1 tablet (2 mg total) by mouth every 6 (six) hours as needed (Back Muscle Spasm). 30 tablet 3  . traMADol (ULTRAM) 50 MG tablet Take 1 tablet (50 mg total) by mouth every 6 (six) hours as needed for moderate pain or severe pain. 90 tablet 1  . triamcinolone ointment (KENALOG) 0.5 % Apply 1 application topically 2 (two) times daily. 30 g 0  . umeclidinium-vilanterol (ANORO ELLIPTA) 62.5-25 MCG/INH AEPB Inhale 1 puff into the lungs daily.    Marland Kitchen umeclidinium-vilanterol (ANORO ELLIPTA) 62.5-25 MCG/INH AEPB Inhale 1 puff into the lungs daily. 1 each 0  . predniSONE (DELTASONE) 20 MG tablet Take 1 tablet (20 mg total) by mouth daily with breakfast. 10 tablet 0   No current facility-administered medications for this visit.    Facility-Administered Medications Ordered in Other Visits  Medication Dose Route Frequency Provider Last Rate Last Dose  . sodium chloride 0.9 % injection 10 mL  10 mL Intravenous PRN Heath Lark, MD   10 mL at 09/18/15 1345  . 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  EYES: normal, Conjunctiva are pink and non-injected, sclera clear OROPHARYNX:no exudate, no erythema and lips, buccal mucosa, and tongue normal  NECK: supple, thyroid normal size, non-tender, without nodularity LYMPH:  no palpable lymphadenopathy in the cervical, axillary or inguinal LUNGS: She has scattered  bilateral wheezes with normal breathing effort HEART: regular rate & rhythm and no murmurs with mild bilateral  lower extremity edema ABDOMEN:abdomen soft, non-tender and normal bowel sounds Musculoskeletal:no cyanosis of digits and no clubbing  NEURO: alert & oriented x 3 with fluent speech, no focal motor/sensory deficits  LABORATORY DATA:  I have reviewed the data as listed    Component Value Date/Time   NA 142 01/22/2016 0931   K 4.2 01/22/2016 0931   CL 106 12/26/2013 0946   CL 102 10/24/2012 1001   CO2 26 01/22/2016 0931   GLUCOSE 100 01/22/2016 0931   GLUCOSE 111 (H) 10/24/2012 1001   BUN 17.1 01/22/2016 0931   CREATININE 1.4 (H) 01/22/2016 0931   CALCIUM 9.1 01/22/2016 0931   PROT 6.9 01/22/2016 0931   ALBUMIN 2.7 (L) 01/22/2016 0931   AST 13 01/22/2016 0931   ALT <9 01/22/2016 0931   ALKPHOS 131 01/22/2016 0931   BILITOT 0.31 01/22/2016 0931   GFRNONAA 74 (L) 06/12/2013 0625   GFRAA 86 (L) 06/12/2013 0625    No results found for: SPEP, UPEP  Lab Results  Component Value Date   WBC 5.4 01/22/2016   NEUTROABS 3.9 01/22/2016   HGB 12.1 01/22/2016   HCT 38.3 01/22/2016   MCV 73.3 (L) 01/22/2016   PLT 176 01/22/2016      Chemistry      Component Value Date/Time   NA 142 01/22/2016 0931   K 4.2 01/22/2016 0931   CL 106 12/26/2013 0946   CL 102 10/24/2012 1001   CO2 26 01/22/2016 0931   BUN 17.1 01/22/2016 0931   CREATININE 1.4 (H) 01/22/2016 0931      Component Value Date/Time   CALCIUM 9.1 01/22/2016 0931   ALKPHOS 131 01/22/2016 0931   AST 13 01/22/2016 0931   ALT <9 01/22/2016 0931   BILITOT 0.31 01/22/2016 0931       ASSESSMENT & PLAN:  Cancer of upper lobe of left lung, Adenocarcinoma She has excellent response to treatment. Recent CT scan in July showed complete resolution of the right upper lung nodule. 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 She is currently not interested to quit now.  Brain metastases treated with radiosurgery Her most recent MRI in March 2017 showed no evidence of disease recurrence. Continue to monitor carefully.  Protein calorie malnutrition ([place]) There could be an element of depression. I recommend she takes Remeron consistently. She will continue to increase oral intake as tolerated.  Bilateral leg edema She has bilateral lower extremity edema, left greater than the right. This is likely related to low albumin with poor circulation. She had colon cancer and some of the lymph nodes were removed. Lasix is not helping, rightfully so since this is not congestive heart failure. The Lasix has caused significant hypotension and dizziness. Continue close monitoring only She is relatively asymptomatic  COPD mixed type [facility]) The patient has COPD exacerbation from bronchitis. I recommend short course prednisone daily. She agreed to try  Skin rash It is very peculiar as it only affects her hands and her feet. She is quite asymptomatic. It has improved since I put her on topical cream. 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 I will like to see if her symptoms improved while on prednisone therapy. I recommend we space out her treatment every 3 weeks  and she agreed   No orders of the defined types were placed in this encounter.  All questions were answered. The patient knows to call the clinic with any problems, questions or concerns. No barriers to learning was detected. I spent 25 minutes counseling the patient face to face. The total time spent in the appointment was 40 minutes and more than 50% was on counseling and review of test results     [facility], [place], MD 01/22/2016 3:31 PM

## 2016-01-23 ENCOUNTER — Encounter: Payer: Self-pay | Admitting: *Deleted

## 2016-01-23 ENCOUNTER — Ambulatory Visit (INDEPENDENT_AMBULATORY_CARE_PROVIDER_SITE_OTHER): Payer: Commercial Managed Care - HMO | Admitting: Psychiatry

## 2016-01-23 ENCOUNTER — Ambulatory Visit (INDEPENDENT_AMBULATORY_CARE_PROVIDER_SITE_OTHER): Payer: Commercial Managed Care - HMO | Admitting: *Deleted

## 2016-01-23 VITALS — BP 156/82 | HR 70 | Temp 98.1°F | Ht 64.0 in | Wt 167.4 lb

## 2016-01-23 DIAGNOSIS — Z0000 Encounter for general adult medical examination without abnormal findings: Secondary | ICD-10-CM

## 2016-01-23 DIAGNOSIS — F17200 Nicotine dependence, unspecified, uncomplicated: Secondary | ICD-10-CM | POA: Diagnosis not present

## 2016-01-23 DIAGNOSIS — F0632 Mood disorder due to known physiological condition with major depressive-like episode: Secondary | ICD-10-CM

## 2016-01-23 DIAGNOSIS — Z23 Encounter for immunization: Secondary | ICD-10-CM

## 2016-01-23 NOTE — Patient Instructions (Signed)
 Fall Prevention in the Home  Falls can cause injuries. They can happen to people of all ages. There are many things you can do to make your home safe and to help prevent falls.  WHAT CAN I DO ON THE OUTSIDE OF MY HOME?  Regularly fix the edges of walkways and driveways and fix any cracks.  Remove anything that might make you trip as you walk through a door, such as a raised step or threshold.  Trim any bushes or trees on the path to your home.  Use bright outdoor lighting.  Clear any walking paths of anything that might make someone trip, such as rocks or tools.  Regularly check to see if handrails are loose or broken. Make sure that both sides of any steps have handrails.  Any raised decks and porches should have guardrails on the edges.  Have any leaves, snow, or ice cleared regularly.  Use sand or salt on walking paths during winter.  Clean up any spills in your garage right away. This includes oil or grease spills. WHAT CAN I DO IN THE BATHROOM?   Use night lights.  Install grab bars by the toilet and in the tub and shower. Do not use towel bars as grab bars.  Use non-skid mats or decals in the tub or shower.  If you need to sit down in the shower, use a plastic, non-slip stool.  Keep the floor dry. Clean up any water that spills on the floor as soon as it happens.  Remove soap buildup in the tub or shower regularly.  Attach bath mats securely with double-sided non-slip rug tape.  Do not have throw rugs and other things on the floor that can make you trip. WHAT CAN I DO IN THE BEDROOM?  Use night lights.  Make sure that you have a light by your bed that is easy to reach.  Do not use any sheets or blankets that are too big for your bed. They should not hang down onto the floor.  Have a firm chair that has side arms. You can use this for support while you get dressed.  Do not have throw rugs and other things on the floor that can make you trip. WHAT CAN I DO  IN THE KITCHEN?  Clean up any spills right away.  Avoid walking on wet floors.  Keep items that you use a lot in easy-to-reach places.  If you need to reach something above you, use a strong step stool that has a grab bar.  Keep electrical cords out of the way.  Do not use floor polish or wax that makes floors slippery. If you must use wax, use non-skid floor wax.  Do not have throw rugs and other things on the floor that can make you trip. WHAT CAN I DO WITH MY STAIRS?  Do not leave any items on the stairs.  Make sure that there are handrails on both sides of the stairs and use them. Fix handrails that are broken or loose. Make sure that handrails are as long as the stairways.  Check any carpeting to make sure that it is firmly attached to the stairs. Fix any carpet that is loose or worn.  Avoid having throw rugs at the top or bottom of the stairs. If you do have throw rugs, attach them to the floor with carpet tape.  Make sure that you have a light switch at the top of the stairs and the bottom of the   stairs. If you do not have them, ask someone to add them for you. WHAT ELSE CAN I DO TO HELP PREVENT FALLS?  Wear shoes that:  Do not have high heels.  Have rubber bottoms.  Are comfortable and fit you well.  Are closed at the toe. Do not wear sandals.  If you use a stepladder:  Make sure that it is fully opened. Do not climb a closed stepladder.  Make sure that both sides of the stepladder are locked into place.  Ask someone to hold it for you, if possible.  Clearly mark and make sure that you can see:  Any grab bars or handrails.  First and last steps.  Where the edge of each step is.  Use tools that help you move around (mobility aids) if they are needed. These include:  Canes.  Walkers.  Scooters.  Crutches.  Turn on the lights when you go into a dark area. Replace any light bulbs as soon as they burn out.  Set up your furniture so you have a clear  path. Avoid moving your furniture around.  If any of your floors are uneven, fix them.  If there are any pets around you, be aware of where they are.  Review your medicines with your doctor. Some medicines can make you feel dizzy. This can increase your chance of falling. Ask your doctor what other things that you can do to help prevent falls.   This information is not intended to replace advice given to you by your health care provider. Make sure you discuss any questions you have with your health care provider.   Document Released: 02/28/2009 Document Revised: 09/18/2014 Document Reviewed: 06/08/2014 Elsevier Interactive Patient Education 2016 [employer]  Health Maintenance, Female Adopting a healthy lifestyle and getting preventive care can go a long way to promote health and wellness. Talk with your health care provider about what schedule of regular examinations is right for you. This is a good chance for you to check in with your provider about disease prevention and staying healthy. In between checkups, there are plenty of things you can do on your own. Experts have done a lot of research about which lifestyle changes and preventive measures are most likely to keep you healthy. Ask your health care provider for more information. WEIGHT AND DIET  Eat a healthy diet  Be sure to include plenty of vegetables, fruits, low-fat dairy products, and lean protein.  Do not eat a lot of foods high in solid fats, added sugars, or salt.  Get regular exercise. This is one of the most important things you can do for your health.  Most adults should exercise for at least 150 minutes each week. The exercise should increase your heart rate and make you sweat (moderate-intensity exercise).  Most adults should also do strengthening exercises at least twice a week. This is in addition to the moderate-intensity exercise.  Maintain a healthy weight  Body mass index (BMI) is a measurement that can  be used to identify possible weight problems. It estimates body fat based on height and weight. Your health care provider can help determine your BMI and help you achieve or maintain a healthy weight.  For females 20 years of age and older:   A BMI below 18.5 is considered underweight.  A BMI of 18.5 to 24.9 is normal.  A BMI of 25 to 29.9 is considered overweight.  A BMI of 30 and above is considered obese.  Watch levels   of cholesterol and blood lipids  You should start having your blood tested for lipids and cholesterol at 67 years of age, then have this test every 5 years.  You may need to have your cholesterol levels checked more often if:  Your lipid or cholesterol levels are high.  You are older than 67 years of age.  You are at high risk for heart disease.  CANCER SCREENING   Lung Cancer  Lung cancer screening is recommended for adults 53-62 years old who are at high risk for lung cancer because of a history of smoking.  A yearly low-dose CT scan of the lungs is recommended for people who:  Currently smoke.  Have quit within the past 15 years.  Have at least a 30-pack-year history of smoking. A pack year is smoking an average of one pack of cigarettes a day for 1 year.  Yearly screening should continue until it has been 15 years since you quit.  Yearly screening should stop if you develop a health problem that would prevent you from having lung cancer treatment.  Breast Cancer  Practice breast self-awareness. This means understanding how your breasts normally appear and feel.  It also means doing regular breast self-exams. Let your health care provider know about any changes, no matter how small.  If you are in your 20s or 30s, you should have a clinical breast exam (CBE) by a health care provider every 1-3 years as part of a regular health exam.  If you are 4 or older, have a CBE every year. Also consider having a breast X-ray (mammogram) every year.  If  you have a family history of breast cancer, talk to your health care provider about genetic screening.  If you are at high risk for breast cancer, talk to your health care provider about having an MRI and a mammogram every year.  Breast cancer gene (BRCA) assessment is recommended for women who have family members with BRCA-related cancers. BRCA-related cancers include:  Breast.  Ovarian.  Tubal.  Peritoneal cancers.  Results of the assessment will determine the need for genetic counseling and BRCA1 and BRCA2 testing. Cervical Cancer Your health care provider may recommend that you be screened regularly for cancer of the pelvic organs (ovaries, uterus, and vagina). This screening involves a pelvic examination, including checking for microscopic changes to the surface of your cervix (Pap test). You may be encouraged to have this screening done every 3 years, beginning at age 75.  For women ages 5-65, health care providers may recommend pelvic exams and Pap testing every 3 years, or they may recommend the Pap and pelvic exam, combined with testing for human papilloma virus (HPV), every 5 years. Some types of HPV increase your risk of cervical cancer. Testing for HPV may also be done on women of any age with unclear Pap test results.  Other health care providers may not recommend any screening for nonpregnant women who are considered low risk for pelvic cancer and who do not have symptoms. Ask your health care provider if a screening pelvic exam is right for you.  If you have had past treatment for cervical cancer or a condition that could lead to cancer, you need Pap tests and screening for cancer for at least 20 years after your treatment. If Pap tests have been discontinued, your risk factors (such as having a new sexual partner) need to be reassessed to determine if screening should resume. Some women have medical problems that increase the chance  of getting cervical cancer. In these cases,  your health care provider may recommend more frequent screening and Pap tests. Colorectal Cancer  This type of cancer can be detected and often prevented.  Routine colorectal cancer screening usually begins at 67 years of age and continues through 67 years of age.  Your health care provider may recommend screening at an earlier age if you have risk factors for colon cancer.  Your health care provider may also recommend using home test kits to check for hidden blood in the stool.  A small camera at the end of a tube can be used to examine your colon directly (sigmoidoscopy or colonoscopy). This is done to check for the earliest forms of colorectal cancer.  Routine screening usually begins at age 50.  Direct examination of the colon should be repeated every 5-10 years through 67 years of age. However, you may need to be screened more often if early forms of precancerous polyps or small growths are found. Skin Cancer  Check your skin from head to toe regularly.  Tell your health care provider about any new moles or changes in moles, especially if there is a change in a mole's shape or color.  Also tell your health care provider if you have a mole that is larger than the size of a pencil eraser.  Always use sunscreen. Apply sunscreen liberally and repeatedly throughout the day.  Protect yourself by wearing long sleeves, pants, a wide-brimmed hat, and sunglasses whenever you are outside. HEART DISEASE, DIABETES, AND HIGH BLOOD PRESSURE   High blood pressure causes heart disease and increases the risk of stroke. High blood pressure is more likely to develop in:  People who have blood pressure in the high end of the normal range (130-139/85-89 mm Hg).  People who are overweight or obese.  People who are African American.  If you are 18-39 years of age, have your blood pressure checked every 3-5 years. If you are 40 years of age or older, have your blood pressure checked every year. You  should have your blood pressure measured twice--once when you are at a hospital or clinic, and once when you are not at a hospital or clinic. Record the average of the two measurements. To check your blood pressure when you are not at a hospital or clinic, you can use:  An automated blood pressure machine at a pharmacy.  A home blood pressure monitor.  If you are between 55 years and 79 years old, ask your health care provider if you should take aspirin to prevent strokes.  Have regular diabetes screenings. This involves taking a blood sample to check your fasting blood sugar level.  If you are at a normal weight and have a low risk for diabetes, have this test once every three years after 67 years of age.  If you are overweight and have a high risk for diabetes, consider being tested at a younger age or more often. PREVENTING INFECTION  Hepatitis B  If you have a higher risk for hepatitis B, you should be screened for this virus. You are considered at high risk for hepatitis B if:  You were born in a country where hepatitis B is common. Ask your health care provider which countries are considered high risk.  Your parents were born in a high-risk country, and you have not been immunized against hepatitis B (hepatitis B vaccine).  You have HIV or AIDS.  You use needles to inject street drugs.  You   live with someone who has hepatitis B.  You have had sex with someone who has hepatitis B.  You get hemodialysis treatment.  You take certain medicines for conditions, including cancer, organ transplantation, and autoimmune conditions. Hepatitis C  Blood testing is recommended for:  Everyone born from 1945 through 1965.  Anyone with known risk factors for hepatitis C. Sexually transmitted infections (STIs)  You should be screened for sexually transmitted infections (STIs) including gonorrhea and chlamydia if:  You are sexually active and are younger than 67 years of age.  You  are older than 67 years of age and your health care provider tells you that you are at risk for this type of infection.  Your sexual activity has changed since you were last screened and you are at an increased risk for chlamydia or gonorrhea. Ask your health care provider if you are at risk.  If you do not have HIV, but are at risk, it may be recommended that you take a prescription medicine daily to prevent HIV infection. This is called pre-exposure prophylaxis (PrEP). You are considered at risk if:  You are sexually active and do not regularly use condoms or know the HIV status of your partner(s).  You take drugs by injection.  You are sexually active with a partner who has HIV. Talk with your health care provider about whether you are at high risk of being infected with HIV. If you choose to begin PrEP, you should first be tested for HIV. You should then be tested every 3 months for as long as you are taking PrEP.  PREGNANCY   If you are premenopausal and you may become pregnant, ask your health care provider about preconception counseling.  If you may become pregnant, take 400 to 800 micrograms (mcg) of folic acid every day.  If you want to prevent pregnancy, talk to your health care provider about birth control (contraception). OSTEOPOROSIS AND MENOPAUSE   Osteoporosis is a disease in which the bones lose minerals and strength with aging. This can result in serious bone fractures. Your risk for osteoporosis can be identified using a bone density scan.  If you are 65 years of age or older, or if you are at risk for osteoporosis and fractures, ask your health care provider if you should be screened.  Ask your health care provider whether you should take a calcium or vitamin D supplement to lower your risk for osteoporosis.  Menopause may have certain physical symptoms and risks.  Hormone replacement therapy may reduce some of these symptoms and risks. Talk to your health care  provider about whether hormone replacement therapy is right for you.  HOME CARE INSTRUCTIONS   Schedule regular health, dental, and eye exams.  Stay current with your immunizations.   Do not use any tobacco products including cigarettes, chewing tobacco, or electronic cigarettes.  If you are pregnant, do not drink alcohol.  If you are breastfeeding, limit how much and how often you drink alcohol.  Limit alcohol intake to no more than 1 drink per day for nonpregnant women. One drink equals 12 ounces of beer, 5 ounces of wine, or 1 ounces of hard liquor.  Do not use street drugs.  Do not share needles.  Ask your health care provider for help if you need support or information about quitting drugs.  Tell your health care provider if you often feel depressed.  Tell your health care provider if you have ever been abused or do not feel safe   home.   This information is not intended to replace advice given to you by your health care provider. Make sure you discuss any questions you have with your health care provider.   Document Released: 11/17/2010 Document Revised: 05/25/2014 Document Reviewed: 04/05/2013 Elsevier Interactive Patient Education 2016 [employer]  Hearing Loss Hearing loss is a partial or total loss of the ability to hear. This can be temporary or permanent, and it can happen in one or both ears. Hearing loss may be referred to as deafness. Medical care is necessary to treat hearing loss properly and to prevent the condition from getting worse. Your hearing may partially or completely come back, depending on what caused your hearing loss and how severe it is. In some cases, hearing loss is permanent. CAUSES Common causes of hearing loss include:   Too much wax in the ear canal.   Infection of the ear canal or middle ear.   Fluid in the middle ear.   Injury to the ear or surrounding area.   An object stuck in the ear.   Prolonged exposure to loud  sounds, such as music.  Less common causes of hearing loss include:   Tumors in the ear.   Viral or bacterial infections, such as meningitis.   A hole in the eardrum (perforated eardrum).  Problems with the hearing nerve that sends signals between the brain and the ear.  Certain medicines.  SYMPTOMS  Symptoms of this condition may include:  Difficulty telling the difference between sounds.  Difficulty following a conversation when there is background noise.  Lack of response to sounds in your environment. This may be most noticeable when you do not respond to startling sounds.  Needing to turn up the volume on the television, radio, etc.  Ringing in the ears.  Dizziness.  Pain in the ears. DIAGNOSIS This condition is diagnosed based on a physical exam and a hearing test (audiometry). The audiometry test will be performed by a hearing specialist (audiologist). You may also be referred to an ear, nose, and throat (ENT) specialist (otolaryngologist).  TREATMENT Treatment for recent onset of hearing loss may include:   Ear wax removal.   Being prescribed medicines to prevent infection (antibiotics).   Being prescribed medicines to reduce inflammation (corticosteroids).  HOME CARE INSTRUCTIONS  If you were prescribed an antibiotic medicine, take it as told by your health care provider. Do not stop taking the antibiotic even if you start to feel better.  Take over-the-counter and prescription medicines only as told by your health care provider.  Avoid loud noises.   Return to your normal activities as told by your health care provider. Ask your health care provider what activities are safe for you.  Keep all follow-up visits as told by your health care provider. This is important. SEEK MEDICAL CARE IF:   You feel dizzy.   You develop new symptoms.   You vomit or feel nauseous.   You have a fever.  SEEK IMMEDIATE MEDICAL CARE IF:  You develop sudden  changes in your vision.   You have severe ear pain.   You have new or increased weakness.  You have a severe headache.   This information is not intended to replace advice given to you by your health care provider. Make sure you discuss any questions you have with your health care provider.   Document Released: 05/04/2005 Document Revised: 01/23/2015 Document Reviewed: 09/19/2014 Elsevier Interactive Patient Education 2016 [employer]  You Can Quit Smoking   If you are ready to quit smoking or are thinking about it, congratulations! You have chosen to help yourself be healthier and live longer! There are lots of different ways to quit smoking. Nicotine gum, nicotine patches, a nicotine inhaler, or nicotine nasal spray can help with physical craving. Hypnosis, support groups, and medicines help break the habit of smoking. TIPS TO GET OFF AND STAY OFF CIGARETTES  Learn to predict your moods. Do not let a bad situation be your excuse to have a cigarette. Some situations in your life might tempt you to have a cigarette.  Ask friends and co-workers not to smoke around you.  Make your home smoke-free.  Never have "just one" cigarette. It leads to wanting another and another. Remind yourself of your decision to quit.  On a card, make a list of your reasons for not smoking. Read it at least the same number of times a day as you have a cigarette. Tell yourself everyday, "I do not want to smoke. I choose not to smoke."  Ask someone at home or work to help you with your plan to quit smoking.  Have something planned after you eat or have a cup of coffee. Take a walk or get other exercise to perk you up. This will help to keep you from overeating.  Try a relaxation exercise to calm you down and decrease your stress. Remember, you may be tense and nervous the first two weeks after you quit. This will pass.  Find new activities to keep your hands busy. Play with a pen, coin, or rubber band.  Doodle or draw things on paper.  Brush your teeth right after eating. This will help cut down the craving for the taste of tobacco after meals. You can try mouthwash too.  Try gum, breath mints, or diet candy to keep something in your mouth. IF YOU SMOKE AND WANT TO QUIT:  Do not stock up on cigarettes. Never buy a carton. Wait until one pack is finished before you buy another.  Never carry cigarettes with you at work or at home.  Keep cigarettes as far away from you as possible. Leave them with someone else.  Never carry matches or a lighter with you.  Ask yourself, "Do I need this cigarette or is this just a reflex?"  Bet with someone that you can quit. Put cigarette money in a piggy bank every morning. If you smoke, you give up the money. If you do not smoke, by the end of the week, you keep the money.  Keep trying. It takes 21 days to change a habit!  Talk to your doctor about using medicines to help you quit. These include nicotine replacement gum, lozenges, or skin patches.   This information is not intended to replace advice given to you by your health care provider. Make sure you discuss any questions you have with your health care provider.   Document Released: 02/28/2009 Document Revised: 07/27/2011 Document Reviewed: 02/28/2009 Elsevier Interactive Patient Education 2016 [employer]  

## 2016-01-23 NOTE — Progress Notes (Signed)
Subjective:   Lauren [name] is a 65 y.o. female who presents for Medicare Annual (Subsequent) preventive examination.  Cardiac Risk Factors include: advanced age (>84mn, >>22women);dyslipidemia;hypertension;sedentary lifestyle;smoking/ tobacco exposure     Objective:     Vitals: BP (!) 155/69 (BP Location: Left Arm, Patient Position: Sitting, Cuff Size: Normal) Comment: Had not taken today's BP meds due to no time to eat before a  Pulse 70   Temp 98.1 F (36.7 C) (Oral)   Ht '5\' 4"'$  (1.626 m)   Wt 167 lb 6.4 oz (75.9 kg)   SpO2 99%   BMI 28.73 kg/m   Body mass index is 28.73 kg/m. Patient has not taken BP meds today. States she didn't have time to eat before she left her house for appts. BP recheck 156/82  left arm manually with adult cuff. Patient will take BP meds and eat as soon as she arrives home. Explained to patient importance of taking BP meds prior to office visits.  Tobacco History  Smoking Status  . Current Every Day Smoker  . Packs/day: 0.75  . Types: Cigarettes  . Start date: 05/18/1965  Smokeless Tobacco  . Never Used     Ready to quit: No Counseling given: Yes   Past Medical History:  Diagnosis Date  . Acute on chronic respiratory failure with hypoxemia ([place] 07/11/2015  . 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 Arthritis   . Back pain   . Benign paroxysmal positional vertigo 08/16/2015  . Bilateral leg edema 10/17/2014  . Bilateral pleural effusion 08/09/2013  . Blurred vision, bilateral 06/06/2014  . Brain metastases ([place] 02/15/12  . Cerebral aneurysm   . Chronic folliculitis    of groin  . Colon cancer ([place] 11/[place]  . COPD [place] obstructive pulmonary disease) ([place]   . Coronary artery disease   . Depression   . Depressive disorder   . Encounter for antineoplastic chemotherapy 09/11/2015  . Encounter for therapeutic drug monitoring 09/11/2015  . Falls frequently 08/02/2015  . Family history of Huntington's disease   . Family history  of trichomonal vaginitis 05/2005  . Fatigue 02/06/2013  . 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 Fibrocystic breast changes   . GERD (gastroesophageal reflux disease)   . Hypercholesterolemia   . Hyperlipidemia   . Hypertension   . Hypokalemia 08/09/2013  . Lung cancer ([place] 06/16/10   PET scan 04/28/2010; primary: increase in size 02/2010 / Well Differentiated Adenocarcinoma of the lung   . Lung nodule    FNA ordered for 04/02/10 by HA>pos Ca  . On antineoplastic chemotherapy started 02/2012   [place]  . Other fatigue 11/28/2014  . Pleural effusion 05/03/2013  . 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 Postmenopausal   . POSTMENOPAUSAL SYNDROME 01/31/2009   Qualifier: Diagnosis of  By: ACarlena Sax MD, SColletta [place]   . S/P radiation therapy 03/01/12   SRS: 1 fraction / 20 Gray each to the Left Occipital Region and to the Right Insular Metastases  . Shortness of breath   . Status post chemotherapy comp. 12/29/11   Carboplatin/Taxol  . Status post radiation therapy 11/16/11 - 12/29/11   Right Lung and Mediastinum: 60 Gy  . Status post stereotactic radiosurgery 01/2012   for Brain Metastases  . Tobacco dependence   . Vitamin D deficiency 08/30/2015  . Weight loss 10/22/2011   Past Surgical History:  Procedure Laterality Date  . BACK SURGERY     Dr HLuiz Ochoa .  BREAST SURGERY     Bil lumpectomy  . cardiac cath x3    . CHEST TUBE INSERTION Right 06/12/2013   Procedure: INSERTION PLEURAL DRAINAGE CATHETER;  Surgeon: Gaye Pollack, MD;  Location: [facility];  Service: Thoracic;  Laterality: Right;  . COLECTOMY  03/22/07   Stage 1 pT2 N0, M0 Adenocarcinoma of the sigmoid  colon  . HERNIA REPAIR    . LUNG LOBECTOMY  06/16/10   Left Upper Lobectomy  . MEDIASTINOSCOPY  10/19/2011   Procedure: MEDIASTINOSCOPY;  Surgeon: Gaye Pollack, MD;  Location: [facility];  Service: Thoracic;  Laterality: N/A;  . TALC PLEURODESIS Right 06/12/2013   Procedure: Pietro Cassis;  Surgeon: Gaye Pollack, MD;  Location: [name];  Service: Thoracic;  Laterality: Right;  . TUBAL LIGATION    . TUNNELED VENOUS CATHETER PLACEMENT     Port-a-Cath   Family History  Problem Relation Age of Onset  . Stomach cancer Maternal Aunt   . Osteoarthritis Father   . Gout Father   . Hypertension Father   . Heart disease Mother     pericarditis;   . Breast cancer Cousin   . Cancer Sister     Lymphatic  . Huntington's disease Son   . Huntington's disease Son   . Anesthesia problems Neg Hx    History  Sexual Activity  . Sexual activity: No    Outpatient Encounter Prescriptions as of 01/23/2016  Medication Sig  . albuterol (PROVENTIL HFA;VENTOLIN HFA) 108 (90 Base) MCG/ACT inhaler Inhale 1 puff into the lungs 2 (two) times daily.  Marland Kitchen aspirin 81 MG chewable tablet Chew 81 mg by mouth daily.  Marland Kitchen BYSTOLIC 10 MG tablet TAKE 1 TABLET (10 MG TOTAL) BY MOUTH DAILY.  Marland Kitchen CAPSAICIN EX Apply topically as needed. To feet  . cholecalciferol (VITAMIN D) 1000 units tablet Take 1,000 Units by mouth daily.  . clonazePAM (KLONOPIN) 0.5 MG tablet Take 0.5 mg by mouth 2 (two) times daily as needed. Reported on 08/28/2015  . COLACE 100 MG capsule Take 100 mg by mouth daily.  . diclofenac sodium (VOLTAREN) 1 % GEL Apply 2 g topically 4 (four) times daily. As needed for pain  . gabapentin (NEURONTIN) 300 MG capsule Take 1 capsule (300 mg total) by mouth 3 (three) times daily. (Patient taking differently: Take 300 mg by mouth daily. )  . HYDROcodone-homatropine (HYCODAN) 5-1.5 MG/5ML syrup Take 5 mLs by mouth every 6 (six) hours as needed for cough.  . lidocaine-prilocaine (EMLA) cream Apply topically as needed. Apply to porta cath site one hour prior to needle stick.  . nitroGLYCERIN (NITROSTAT) 0.4 MG SL tablet Place 1 tablet (0.4 mg total) under the tongue every 5 (five) minutes as needed. For chest pain  . omeprazole (PRILOSEC) 40 MG capsule TAKE 1 CAPSULE EVERY DAY  . potassium chloride SA (K-DUR,KLOR-CON) 20 MEQ  tablet TAKE 1 TABLET TWICE DAILY  . pravastatin (PRAVACHOL) 40 MG tablet TAKE 1 TABLET ONE TIME DAILY  . predniSONE (DELTASONE) 20 MG tablet Take 1 tablet (20 mg total) by mouth daily with breakfast.  . tiZANidine (ZANAFLEX) 2 MG tablet Take 1 tablet (2 mg total) by mouth every 6 (six) hours as needed (Back Muscle Spasm).  . traMADol (ULTRAM) 50 MG tablet Take 1 tablet (50 mg total) by mouth every 6 (six) hours as needed for moderate pain or severe pain.  Marland Kitchen umeclidinium-vilanterol (ANORO ELLIPTA) 62.5-25 MCG/INH AEPB Inhale 1 puff into the lungs daily.  Marland Kitchen FLUoxetine (PROZAC) 20  MG tablet TAKE 1 TABLET EVERY DAY (Patient not taking: Reported on 01/23/2016)  . triamcinolone ointment (KENALOG) 0.5 % Apply 1 application topically 2 (two) times daily. (Patient not taking: Reported on 01/23/2016)  . [DISCONTINUED] umeclidinium-vilanterol (ANORO ELLIPTA) 62.5-25 MCG/INH AEPB Inhale 1 puff into the lungs daily.   Facility-Administered Encounter Medications as of 01/23/2016  Medication  . sodium chloride 0.9 % injection 10 mL    Activities of Daily Living In your present state of health, do you have any difficulty performing the following activities: 01/23/2016 12/23/2015  Hearing? N N  Vision? N N  Difficulty concentrating or making decisions? [name]  Walking or climbing stairs? Y Y  Dressing or bathing? N N  Doing errands, shopping? N Y  Conservation officer, nature and eating ? N N  Using the Toilet? N N  In the past six months, have you accidently leaked urine? Y N  Do you have problems with loss of bowel control? N N  Managing your Medications? N N  Managing your Finances? N N  Housekeeping or managing your Housekeeping? Y Y  Some recent data might be hidden  Home Safety:  My home has a working smoke alarm:  Yes X2           My home throw rugs have been fastened down to the floor or removed:  Fastened down I have non-slip mats in the bathtub and shower:  Yes plus shower chair         All my home's stairs have  railings or bannisters: One level home with  2 front steps with "post to steady me." 4 back steps with railing.          My home's floors, stairs and hallways are free from clutter, wires and cords:  Yes        Patient Care Team: [name], MD as PCP - General (Family Medicine) Gaye Pollack, MD (Cardiothoracic Surgery) Dickie La, MD (Family Medicine) Heath Lark, MD as Consulting Physician (Hematology and Oncology) Verlin Grills, RN as Atkinson, OD (Optometry)    Assessment:     Exercise Activities and Dietary recommendations Current Exercise Habits: The patient does not participate in regular exercise at present, Exercise limited by: respiratory conditions(s);Other - see comments ("Tires easily")  Goals    . Blood Pressure < 150/90    . Exercise 2x per week (30 min per time)    . LDL CALC < 100      Fall Risk Fall Risk  01/23/2016 12/23/2015 11/11/2015 09/26/2015 08/29/2015  Falls in the past year? Yes Yes Yes Yes Yes  Number falls in past yr: 2 or more 1 2 or more 2 or more 2 or more  Injury with Fall? No No Yes No No  Risk Factor Category  High Fall Risk - High Fall Risk - High Fall Risk  Risk for fall due to : Other (Comment) Impaired balance/gait Impaired balance/gait - History of fall(s);Impaired balance/gait  Risk for fall due to (comments): Vertigo but has since resolved - - - -  Follow up Education provided;Falls prevention discussed Falls evaluation completed;Education provided Education provided - Falls prevention discussed   Depression Screen PHQ 2/9 Scores 01/23/2016 12/23/2015 11/26/2015 09/27/2015  PHQ - 2 Score 1 0 0 0  PHQ- 9 Score - - 2 2  Exception Documentation - - - -  Patient sees therapist  Conitive Testing MMSE - Mini Mental State Exam 11/08/2014  Orientation  to time 5  Orientation to Place 5  Registration 3  Attention/ Calculation 5  Recall 3  Language- name 2 objects 2  Language- repeat 1    Language- follow 3 step command 3  Language- read & follow direction 1  Write a sentence 1  Copy design 1  Total score 30  Mini-Cog passed with score 3/5  TUG Test:  Done in 12 seconds. Patient used both hands to push out of chair, steadied herself on counter shelf, walked with cane.  Immunization History  Administered Date(s) Administered  . Influenza Split 04/22/2011, 02/19/2012  . Influenza Whole 02/13/2008, 04/23/2009, 02/27/2010  . Influenza,inj,Quad PF,36+ Mos 02/06/2013, 02/16/2014, 01/30/2015  . Pneumococcal Conjugate-13 11/08/2014  . Pneumococcal Polysaccharide-23 05/05/2010  . Td 01/16/2005  . Tdap 08/01/2015  . Zoster 04/17/2014   Screening Tests Health Maintenance  Topic Date Due  . COLONOSCOPY  03/17/2014  . PNA vac Low Risk Adult (2 of 2 - PPSV23) 11/08/2015  . INFLUENZA VACCINE  12/17/2015  . MAMMOGRAM  10/30/2017  . TETANUS/TDAP  07/31/2025  . DEXA SCAN  Completed  . ZOSTAVAX  Addressed  . Hepatitis C Screening  Completed  Patient had last colonoscopy in 2009. 3 year f/u was recommended at that time. Patient will discuss need for repeat with oncologist and contact [facility] to schedule. Pneumovax and flu vaccines administered today     Plan:    During the course of the visit the patient was educated and counseled about the following appropriate screening and preventive services:   Vaccines to include Pneumoccal, Influenza, Td, Zostavax  Cardiovascular Disease  Colorectal cancer screening  Bone density screening  Diabetes screening  Mammography/PAP  Nutrition counseling   Patient Instructions (the written plan) was given to the patient.   Velora Heckler, RN  01/23/2016

## 2016-01-24 ENCOUNTER — Ambulatory Visit
Admission: RE | Admit: 2016-01-24 | Discharge: 2016-01-24 | Disposition: A | Discharge status: Home / Self Care | Payer: Commercial Managed Care - HMO | Source: Ambulatory Visit | Attending: Radiation Oncology | Admitting: Radiation Oncology

## 2016-01-24 DIAGNOSIS — C7931 Secondary malignant neoplasm of brain: Secondary | ICD-10-CM

## 2016-01-24 DIAGNOSIS — C7949 Secondary malignant neoplasm of other parts of nervous system: Principal | ICD-10-CM

## 2016-01-24 DIAGNOSIS — C3490 Malignant neoplasm of unspecified part of unspecified bronchus or lung: Secondary | ICD-10-CM | POA: Diagnosis not present

## 2016-01-24 MED ORDER — GADOBENATE DIMEGLUMINE 529 MG/ML IV SOLN
15.0000 mL | Freq: Once | INTRAVENOUS | Status: AC | PRN
  Administered 2016-01-24: 15 mL via INTRAVENOUS

## 2016-01-27 ENCOUNTER — Other Ambulatory Visit: Payer: Self-pay | Admitting: *Deleted

## 2016-01-27 NOTE — Patient Outreach (Signed)
[facility]) Care Management  01/27/2016  Lauren [name] 1950-07-01 [id number]    RN Health Coach attempted #1 Follow up outreach call to patient.  Patient was unavailable. HIPPA compliance voicemail message was left with return callback number.   Plan: RN will call patient again within 14 days.    [place] Care Management [phone]

## 2016-01-29 ENCOUNTER — Ambulatory Visit: Payer: Self-pay | Admitting: Radiation Oncology

## 2016-01-31 ENCOUNTER — Ambulatory Visit
Admission: RE | Admit: 2016-01-31 | Discharge: 2016-01-31 | Disposition: A | Discharge status: Home / Self Care | Payer: Commercial Managed Care - HMO | Source: Ambulatory Visit | Attending: Radiation Oncology | Admitting: Radiation Oncology

## 2016-01-31 ENCOUNTER — Encounter: Payer: Self-pay | Admitting: Radiation Oncology

## 2016-01-31 DIAGNOSIS — C7949 Secondary malignant neoplasm of other parts of nervous system: Secondary | ICD-10-CM | POA: Insufficient documentation

## 2016-01-31 DIAGNOSIS — Z08 Encounter for follow-up examination after completed treatment for malignant neoplasm: Secondary | ICD-10-CM | POA: Diagnosis not present

## 2016-01-31 DIAGNOSIS — C7931 Secondary malignant neoplasm of brain: Secondary | ICD-10-CM | POA: Insufficient documentation

## 2016-01-31 DIAGNOSIS — Z85118 Personal history of other malignant neoplasm of bronchus and lung: Secondary | ICD-10-CM | POA: Diagnosis not present

## 2016-01-31 NOTE — Progress Notes (Signed)
Radiation Oncology         [phone] ________________________________  Name: Lauren [name] MRN: [id number]  Date: 01/31/2016  DOB: 06-03-1950  Follow-Up Visit Note  Outpatient  CC: [name],Lauren D, MD  Lauren Gash, MD   Diagnosis and Prior Radiotherapy: Metastatic non-small cell lung cancer, adenocarcinoma, with 2 brain metastases. Left occipital and right insular metastases  Interval Since Last Radiation: She completed 20 Gray in 1 fraction to both of the metastases on 03/01/2012      ICD-9-CM ICD-10-CM   1. Secondary malignant neoplasm of brain and spinal cord (HCC) 198.3 C79.31     C79.49      Narrative: The patient presenting for her routine follow-up appointment of Lauren [name] radiation to her left occipital completed 10/15/13with radiation oncology.  She denies pain except to her fingers, which is from chemotherapy. She is currently on a break because of the itching and pain in her fingers and hands. She has no concerns at this time. She is using a cane for ambulating.   Systemic imaging in July showed no evidence of progression or recurrence. She is continuing Nivolumab with Dr. Alvy [name]. MRI without progression per brain this week.   ALLERGIES:  is allergic to adhesive [tape] and lisinopril.  Meds: Current Outpatient Prescriptions  Medication Sig Dispense Refill  . albuterol (PROVENTIL HFA;VENTOLIN HFA) 108 (90 Base) MCG/ACT inhaler Inhale 1 puff into the lungs 2 (two) times daily. 6.7 g prn  . aspirin 81 MG chewable tablet Chew 81 mg by mouth daily.    Marland Kitchen BYSTOLIC 10 MG tablet TAKE 1 TABLET (10 MG TOTAL) BY MOUTH DAILY. 90 tablet 3  . CAPSAICIN EX Apply topically as needed. To feet    . cholecalciferol (VITAMIN D) 1000 units tablet Take 1,000 Units by mouth daily.    . clonazePAM (KLONOPIN) 0.5 MG tablet Take 0.5 mg by mouth 2 (two) times daily as needed. Reported on 08/28/2015    . COLACE 100 MG capsule Take 100 mg by mouth daily.    . diclofenac sodium  (VOLTAREN) 1 % GEL Apply 2 g topically 4 (four) times daily. As needed for pain 100 g 3  . FLUoxetine (PROZAC) 20 MG tablet TAKE 1 TABLET EVERY DAY 90 tablet 0  . gabapentin (NEURONTIN) 300 MG capsule Take 1 capsule (300 mg total) by mouth 3 (three) times daily. (Patient taking differently: Take 300 mg by mouth daily. ) 90 capsule 1  . HYDROcodone-homatropine (HYCODAN) 5-1.5 MG/5ML syrup Take 5 mLs by mouth every 6 (six) hours as needed for cough. 240 mL 0  . lidocaine-prilocaine (EMLA) cream Apply topically as needed. Apply to porta cath site one hour prior to needle stick. 30 g 6  . nitroGLYCERIN (NITROSTAT) 0.4 MG SL tablet Place 1 tablet (0.4 mg total) under the tongue every 5 (five) minutes as needed. For chest pain 25 tablet 5  . omeprazole (PRILOSEC) 40 MG capsule TAKE 1 CAPSULE EVERY DAY 90 capsule prn  . potassium chloride SA (K-DUR,KLOR-CON) 20 MEQ tablet TAKE 1 TABLET TWICE DAILY 180 tablet 0  . pravastatin (PRAVACHOL) 40 MG tablet TAKE 1 TABLET ONE TIME DAILY 90 tablet 3  . predniSONE (DELTASONE) 20 MG tablet Take 1 tablet (20 mg total) by mouth daily with breakfast. 10 tablet 0  . tiZANidine (ZANAFLEX) 2 MG tablet Take 1 tablet (2 mg total) by mouth every 6 (six) hours as needed (Back Muscle Spasm). 30 tablet 3  . traMADol (ULTRAM) 50 MG tablet Take 1 tablet (50 mg  total) by mouth every 6 (six) hours as needed for moderate pain or severe pain. 90 tablet 1  . triamcinolone ointment (KENALOG) 0.5 % Apply 1 application topically 2 (two) times daily. 30 g 0  . umeclidinium-vilanterol (ANORO ELLIPTA) 62.5-25 MCG/INH AEPB Inhale 1 puff into the lungs daily. 1 each 0   No current facility-administered medications for this encounter.    Facility-Administered Medications Ordered in Other Encounters  Medication Dose Route Frequency Provider Last Rate Last Dose  . sodium chloride 0.9 % injection 10 mL  10 mL Intravenous PRN Heath Lark, MD   10 mL at 09/18/15 1345    Physical Findings: The  patient is in no acute distress. Patient is alert and oriented.     height is '5\' 4"'$  (1.626 m) and weight is 166 lb 12.8 oz (75.7 kg). Her temperature is 97.8 F (36.6 C). Her blood pressure is 139/72 and her pulse is 76. Her oxygen saturation is 99%. .   General: Alert and oriented, in no acute distress HEENT: Head is normocephalic. Extraocular movements are intact. Oropharynx is clear. Mucous membranes are moist. Neck: Neck is supple, no palpable cervical or supraclavicular lymphadenopathy. Heart: Regular in rate and rhythm with no murmurs, rubs, or gallops. Chest: Clear to auscultation bilaterally, with no rhonchi, wheezes, or rales. Lymphatics: see Neck Exam Musculoskeletal: symmetric strength and muscle tone throughout. Uses cane Neurologic: Cranial nerves II through XII are grossly intact. No obvious focalities. Speech is fluent. Coordination is intact. Psychiatric: Judgment and insight are intact. Affect is appropriate.  KPS = 80  100 - Normal; no complaints; no evidence of disease. 90   - Able to carry on normal activity; minor signs or symptoms of disease. 80   - Normal activity with effort; some signs or symptoms of disease. 12   - Cares for self; unable to carry on normal activity or to do active work. 60   - Requires occasional assistance, but is able to care for most of his personal needs. 50   - Requires considerable assistance and frequent medical care. 37   - Disabled; requires special care and assistance. 31   - Severely disabled; hospital admission is indicated although death not imminent. 45   - Very sick; hospital admission necessary; active supportive treatment necessary. 10   - Moribund; fatal processes progressing rapidly. 0     - Dead  Karnofsky DA, Abelmann [place], Craver LS and Burchenal [facility] [phone]) The use of the nitrogen mustards in the palliative treatment of carcinoma: with particular reference to bronchogenic carcinoma Cancer 1 [phone]   Lab Findings: Lab Results   Component Value Date   WBC 5.4 01/22/2016   HGB 12.1 01/22/2016   HCT 38.3 01/22/2016   MCV 73.3 (L) 01/22/2016   PLT 176 01/22/2016      CBC    Component Value Date/Time   WBC 5.4 01/22/2016 0931   WBC 6.5 06/12/2013 0625   RBC 5.22 01/22/2016 0931   RBC 5.16 (H) 06/12/2013 0625   HGB 12.1 01/22/2016 0931   HCT 38.3 01/22/2016 0931   PLT 176 01/22/2016 0931   MCV 73.3 (L) 01/22/2016 0931   MCH 23.2 (L) 01/22/2016 0931   MCH 25.0 (L) 06/12/2013 0625   MCHC 31.6 01/22/2016 0931   MCHC 31.6 06/12/2013 0625   RDW 17.9 (H) 01/22/2016 0931   LYMPHSABS 0.8 (L) 01/22/2016 0931   MONOABS 0.6 01/22/2016 0931   EOSABS 0.1 01/22/2016 0931   BASOSABS 0.0 01/22/2016 0931  Radiographic Findings: As above  Impression/Plan: No evidence of progression.  She and I spoke for a while about social stressors - she is in touch with a therapist. She is very resilient, with an intact sense of humor.  MRI brain and follow-up in 8 months.   _________________   Eppie Gibson, MD This document serves as a record of services personally performed by Eppie Gibson, MD. It was created on her behalf by Bethann Humble, a trained medical scribe. The creation of this record is based on the scribe's personal observations and the provider's statements to them. This document has been checked and approved by the attending provider.

## 2016-01-31 NOTE — Progress Notes (Signed)
Lauren [name] is here for follow up of [place] radiation to her Left Occipital completed 03/01/12. She denies pain except to her fingers, which is from Chemotherapy. She is currently on a break because of the itching and pain in her fingers and hands. She has no concerns at this time. She is using a cane for ambulating.   BP 139/72   Pulse 76   Temp 97.8 F (36.6 C)   Ht '5\' 4"'$  (1.626 m)   Wt 166 lb 12.8 oz (75.7 kg)   SpO2 99% Comment: room air  BMI 28.63 kg/m    Wt Readings from Last 3 Encounters:  01/31/16 166 lb 12.8 oz (75.7 kg)  01/23/16 167 lb 6.4 oz (75.9 kg)  01/22/16 169 lb 8 oz (76.9 kg)

## 2016-02-05 ENCOUNTER — Other Ambulatory Visit: Payer: Self-pay

## 2016-02-05 ENCOUNTER — Encounter: Payer: Self-pay | Admitting: *Deleted

## 2016-02-05 ENCOUNTER — Ambulatory Visit: Payer: Self-pay

## 2016-02-05 NOTE — Progress Notes (Signed)
I reviewed the Medications, Problem List, Past Medical and Surgical Histories as well as Family and Social Histories.   I have reviewed this visit and discussed with Howell Rucks, RN, BSN, and agree with her documentation.

## 2016-02-06 ENCOUNTER — Other Ambulatory Visit: Payer: Self-pay | Admitting: Licensed Clinical Social Worker
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
## 2016-02-10 ENCOUNTER — Encounter: Payer: Self-pay | Admitting: *Deleted

## 2016-02-10 ENCOUNTER — Other Ambulatory Visit: Payer: Self-pay | Admitting: *Deleted

## 2016-02-10 NOTE — Patient Outreach (Signed)
[facility]) Care Management  02/10/2016   Lauren [name] 04/08/1951 [id number]  Subjective: RN Health Coach telephone call to patient.  [place] compliance verified. Per patient she is doing pretty good. Patient is still smoking and as RN spoke with patient.  RN noted that patient still has the dry cough. Per patient her  appetite is good. Per patient she is going to appointments as ordered. She has received her flu shot for this year. Patient stated she has been trying to keep up with all her appointment with the different cards the offices  give her. RN told patient she would send a calendar booklet to assist her. Per patient she has not been exercising as she should. RN explained that even with her respiratory situation she can do chair exercise. Patient agreed that she would try. Patient has agreed to follow up outreach calls.    Objective:   Current Medications:  Current Outpatient Prescriptions  Medication Sig Dispense Refill  . albuterol (PROVENTIL HFA;VENTOLIN HFA) 108 (90 Base) MCG/ACT inhaler Inhale 1 puff into the lungs 2 (two) times daily. 6.7 g prn  . aspirin 81 MG chewable tablet Chew 81 mg by mouth daily.    Marland Kitchen BYSTOLIC 10 MG tablet TAKE 1 TABLET (10 MG TOTAL) BY MOUTH DAILY. 90 tablet 3  . CAPSAICIN EX Apply topically as needed. To feet    . cholecalciferol (VITAMIN D) 1000 units tablet Take 1,000 Units by mouth daily.    . clonazePAM (KLONOPIN) 0.5 MG tablet Take 0.5 mg by mouth 2 (two) times daily as needed. Reported on 08/28/2015    . COLACE 100 MG capsule Take 100 mg by mouth daily.    . diclofenac sodium (VOLTAREN) 1 % GEL Apply 2 g topically 4 (four) times daily. As needed for pain 100 g 3  . FLUoxetine (PROZAC) 20 MG tablet TAKE 1 TABLET EVERY DAY 90 tablet 0  . gabapentin (NEURONTIN) 300 MG capsule Take 1 capsule (300 mg total) by mouth 3 (three) times daily. (Patient taking differently: Take 300 mg by mouth daily. ) 90 capsule 1  .  HYDROcodone-homatropine (HYCODAN) 5-1.5 MG/5ML syrup Take 5 mLs by mouth every 6 (six) hours as needed for cough. 240 mL 0  . lidocaine-prilocaine (EMLA) cream Apply topically as needed. Apply to porta cath site one hour prior to needle stick. 30 g 6  . nitroGLYCERIN (NITROSTAT) 0.4 MG SL tablet Place 1 tablet (0.4 mg total) under the tongue every 5 (five) minutes as needed. For chest pain 25 tablet 5  . omeprazole (PRILOSEC) 40 MG capsule TAKE 1 CAPSULE EVERY DAY 90 capsule prn  . potassium chloride SA (K-DUR,KLOR-CON) 20 MEQ tablet TAKE 1 TABLET TWICE DAILY 180 tablet 0  . pravastatin (PRAVACHOL) 40 MG tablet TAKE 1 TABLET ONE TIME DAILY 90 tablet 3  . predniSONE (DELTASONE) 20 MG tablet Take 1 tablet (20 mg total) by mouth daily with breakfast. 10 tablet 0  . tiZANidine (ZANAFLEX) 2 MG tablet Take 1 tablet (2 mg total) by mouth every 6 (six) hours as needed (Back Muscle Spasm). 30 tablet 3  . traMADol (ULTRAM) 50 MG tablet Take 1 tablet (50 mg total) by mouth every 6 (six) hours as needed for moderate pain or severe pain. 90 tablet 1  . triamcinolone ointment (KENALOG) 0.5 % Apply 1 application topically 2 (two) times daily. 30 g 0  . umeclidinium-vilanterol (ANORO ELLIPTA) 62.5-25 MCG/INH AEPB Inhale 1 puff into the lungs daily. 1 each 0  No current facility-administered medications for this visit.    Facility-Administered Medications Ordered in Other Visits  Medication Dose Route Frequency Provider Last Rate Last Dose  . sodium chloride 0.9 % injection 10 mL  10 mL Intravenous PRN Heath Lark, MD   10 mL at 09/18/15 1345    Functional Status:  In your present state of health, do you have any difficulty performing the following activities: 02/10/2016 01/23/2016  Hearing? N N  Vision? N N  Difficulty concentrating or making decisions? [name]  Walking or climbing stairs? Y Y  Dressing or bathing? N N  Doing errands, shopping? Y N  Preparing Food and eating ? N N  Using the Toilet? N N  In  the past six months, have you accidently leaked urine? Y Y  Do you have problems with loss of bowel control? N N  Managing your Medications? N N  Managing your Finances? N N  Housekeeping or managing your Housekeeping? [name]  Some recent data might be hidden    Fall/Depression Screening: PHQ 2/9 Scores 02/10/2016 01/31/2016 01/23/2016 12/23/2015 11/26/2015 09/27/2015 09/26/2015  PHQ - 2 Score 0 0 1 0 0 0 0  PHQ- 9 Score - - - - 2 2 -  Exception Documentation - - - - - - -   THN CM Care Plan Problem One   Flowsheet Row Most Recent Value  Care Plan Problem One  knowledge deficit in self management of COPD  Role Documenting the Problem One  [place] for Problem One  Active  THN Long Term Goal (31-90 days)  Patient will not have any readmissions for COPD within the next 90 days  THN Long Term Goal Start Date  02/10/16  Interventions for Problem One Long Term Goal  RN reminded patient to keep appointments with PCP and oncology and pulmonary specialist. RN reminded patient to take medications as per order.  THN CM Short Term Goal #1 (0-30 days)  Patient will report making appointment for follow up maintenance check ups within the next 30 days  THN CM Short Term Goal #1 Start Date  02/10/16  Interventions for Short Term Goal #1  ongoing. Patient had flu shots/ need dental and eye exam  THN CM Short Term Goal #2 (0-30 days)  Patient will report going to community outreach programs for fresh vegetables within the next 30 days  THN CM Short Term Goal #2 Met Date  02/10/16  Interventions for Short Term Goal #2  RN discussed with patient fresh vegetable outreach programs. RN gave patient the number to call to get locations.  THN CM Short Term Goal #4 (0-30 days)  Patient  wil verbalize that she is using the hand cream of peeling hands within the next 30 days  THN CM Short Term Goal #4 Met Date  02/10/16  Interventions for Short Term Goal #4  RN discussed with patient about the gloves that she  can put on hand with the hand cream. RN will follow up patient on effectiveness  THN CM Short Term Goal #5 (0-30 days)  Patient will verbalize witin the next 30 days that she is getting in into a routine exercise program  [facility] CM Short Term Goal #5 Start Date  02/10/16  Interventions for Short Term Goal #5  RN sent patient a list of chair exercises with pictures. RN discussed with patieht the importance of building up her strength.       Assessment:  Patient is still smoking  Patient does not have an exercise routine Patient will continue to benefit from [place] telephonic outreach for education and support for COPD self management.  Plan:  RN sent a 2017-2018 calendar book RN sent educational information on chair exercises RN will follow up within the month of October  [facility] Care Management [phone]

## 2016-02-11 ENCOUNTER — Other Ambulatory Visit: Payer: Self-pay | Admitting: Hematology and Oncology

## 2016-02-12 ENCOUNTER — Other Ambulatory Visit (HOSPITAL_BASED_OUTPATIENT_CLINIC_OR_DEPARTMENT_OTHER): Payer: Commercial Managed Care - HMO

## 2016-02-12 ENCOUNTER — Ambulatory Visit (HOSPITAL_BASED_OUTPATIENT_CLINIC_OR_DEPARTMENT_OTHER): Payer: Commercial Managed Care - HMO

## 2016-02-12 VITALS — BP 157/72 | HR 68 | Temp 98.1°F | Resp 18

## 2016-02-12 DIAGNOSIS — Z5112 Encounter for antineoplastic immunotherapy: Secondary | ICD-10-CM

## 2016-02-12 DIAGNOSIS — Z5181 Encounter for therapeutic drug level monitoring: Secondary | ICD-10-CM

## 2016-02-12 DIAGNOSIS — C3412 Malignant neoplasm of upper lobe, left bronchus or lung: Secondary | ICD-10-CM

## 2016-02-12 DIAGNOSIS — C7931 Secondary malignant neoplasm of brain: Secondary | ICD-10-CM

## 2016-02-12 DIAGNOSIS — Z79899 Other long term (current) drug therapy: Secondary | ICD-10-CM | POA: Diagnosis not present

## 2016-02-12 DIAGNOSIS — Z5111 Encounter for antineoplastic chemotherapy: Secondary | ICD-10-CM

## 2016-02-12 DIAGNOSIS — C7949 Secondary malignant neoplasm of other parts of nervous system: Principal | ICD-10-CM

## 2016-02-12 LAB — CBC WITH DIFFERENTIAL/PLATELET
BASO%: 0.5 % (ref 0.0–2.0)
BASOS ABS: 0 10*3/uL (ref 0.0–0.1)
EOS%: 1.6 % (ref 0.0–7.0)
Eosinophils Absolute: 0.1 10*3/uL (ref 0.0–0.5)
HEMATOCRIT: 39.1 % (ref 34.8–46.6)
HGB: 12.3 g/dL (ref 11.6–15.9)
LYMPH%: 16.5 % (ref 14.0–49.7)
MCH: 23.1 pg — AB (ref 25.1–34.0)
MCHC: 31.3 g/dL — AB (ref 31.5–36.0)
MCV: 73.6 fL — ABNORMAL LOW (ref 79.5–101.0)
MONO#: 0.5 10*3/uL (ref 0.1–0.9)
MONO%: 8.5 % (ref 0.0–14.0)
NEUT#: 3.9 10*3/uL (ref 1.5–6.5)
NEUT%: 72.9 % (ref 38.4–76.8)
Platelets: 172 10*3/uL (ref 145–400)
RBC: 5.31 10*6/uL (ref 3.70–5.45)
RDW: 18.7 % — ABNORMAL HIGH (ref 11.2–14.5)
WBC: 5.4 10*3/uL (ref 3.9–10.3)
lymph#: 0.9 10*3/uL (ref 0.9–3.3)

## 2016-02-12 LAB — COMPREHENSIVE METABOLIC PANEL
ALT: <9 U/L (ref 0–55)
ANION GAP: 9 meq/L (ref 3–11)
AST: 10 U/L (ref 5–34)
Albumin: 2.7 g/dL — ABNORMAL LOW (ref 3.5–5.0)
Alkaline Phosphatase: 153 U/L — ABNORMAL HIGH (ref 40–150)
BUN: 11.9 mg/dL (ref 7.0–26.0)
CALCIUM: 8.9 mg/dL (ref 8.4–10.4)
CHLORIDE: 109 meq/L (ref 98–109)
CO2: 23 meq/L (ref 22–29)
CREATININE: 1.2 mg/dL — AB (ref 0.6–1.1)
EGFR: 57 mL/min/{1.73_m2} — AB (ref >90)
Glucose: 87 mg/dl (ref 70–140)
POTASSIUM: 4.2 meq/L (ref 3.5–5.1)
Sodium: 141 mEq/L (ref 136–145)
Total Bilirubin: 0.5 mg/dL (ref 0.20–1.20)
Total Protein: 6.9 g/dL (ref 6.4–8.3)

## 2016-02-12 LAB — TSH: TSH: 1.775 m[IU]/L (ref 0.308–3.960)

## 2016-02-12 MED ORDER — SODIUM CHLORIDE 0.9% FLUSH
10.0000 mL | INTRAVENOUS | Status: DC | PRN
  Administered 2016-02-12: 10 mL
  Filled 2016-02-12: qty 10

## 2016-02-12 MED ORDER — HEPARIN SOD (PORK) LOCK FLUSH 100 UNIT/ML IV SOLN
500.0000 [IU] | Freq: Once | INTRAVENOUS | Status: AC | PRN
  Administered 2016-02-12: 500 [IU]
  Filled 2016-02-12: qty 5

## 2016-02-12 MED ORDER — NIVOLUMAB CHEMO INJECTION 100 MG/10ML
240.0000 mg | Freq: Once | INTRAVENOUS | Status: AC
  Administered 2016-02-12: 240 mg via INTRAVENOUS
  Filled 2016-02-12: qty 20

## 2016-02-12 MED ORDER — SODIUM CHLORIDE 0.9 % IV SOLN
Freq: Once | INTRAVENOUS | Status: AC
  Administered 2016-02-12: 10:00:00 via INTRAVENOUS

## 2016-02-12 NOTE — Patient Instructions (Signed)
[facility] Discharge Instructions for Patients Receiving Chemotherapy  Today you received the following chemotherapy agents:  Opdivo (nivolumab)  To help prevent nausea and vomiting after your treatment, we encourage you to take your nausea medication as prescribed.   If you develop nausea and vomiting that is not controlled by your nausea medication, call the clinic.   BELOW ARE SYMPTOMS THAT SHOULD BE REPORTED IMMEDIATELY:  *FEVER GREATER THAN 100.5 F  *CHILLS WITH OR WITHOUT FEVER  NAUSEA AND VOMITING THAT IS NOT CONTROLLED WITH YOUR NAUSEA MEDICATION  *UNUSUAL SHORTNESS OF BREATH  *UNUSUAL BRUISING OR BLEEDING  TENDERNESS IN MOUTH AND THROAT WITH OR WITHOUT PRESENCE OF ULCERS  *URINARY PROBLEMS  *BOWEL PROBLEMS  UNUSUAL RASH Items with * indicate a potential emergency and should be followed up as soon as possible.  Feel free to call the clinic you have any questions or concerns. The clinic phone number is [phone].  Please show the [place] at check-in to the Emergency Department and triage nurse.

## 2016-02-21 ENCOUNTER — Telehealth: Payer: Self-pay | Admitting: Hematology and Oncology

## 2016-02-21 NOTE — Telephone Encounter (Signed)
lvm to inform pt of r/s appt to 10/19 per NG covering at [facility] 10/18

## 2016-02-27 ENCOUNTER — Ambulatory Visit (INDEPENDENT_AMBULATORY_CARE_PROVIDER_SITE_OTHER): Payer: Commercial Managed Care - HMO | Admitting: Family Medicine

## 2016-02-27 ENCOUNTER — Encounter: Payer: Self-pay | Admitting: Family Medicine

## 2016-02-27 VITALS — BP 148/80 | HR 74 | Temp 98.2°F | Ht 64.0 in | Wt 168.0 lb

## 2016-02-27 DIAGNOSIS — L309 Dermatitis, unspecified: Secondary | ICD-10-CM

## 2016-02-27 DIAGNOSIS — H9193 Unspecified hearing loss, bilateral: Secondary | ICD-10-CM | POA: Insufficient documentation

## 2016-02-27 DIAGNOSIS — H9042 Sensorineural hearing loss, unilateral, left ear, with unrestricted hearing on the contralateral side: Secondary | ICD-10-CM | POA: Diagnosis not present

## 2016-02-27 DIAGNOSIS — H905 Unspecified sensorineural hearing loss: Secondary | ICD-10-CM

## 2016-02-27 HISTORY — DX: Dermatitis, unspecified: L30.9

## 2016-02-27 HISTORY — DX: Unspecified hearing loss, bilateral: H91.93

## 2016-02-27 MED ORDER — CLOTRIMAZOLE 1 % EX CREA: TOPICAL_CREAM | CUTANEOUS | 0 refills | Status: DC

## 2016-02-27 NOTE — Patient Instructions (Signed)
Rub the antifungal cream Clotrimazole on skin of hands, wrists, and feet three times a day.  Take a minute to rub the cream in well to help int penetrate deep into the think skin of hands an feet.   Our office will set up a consultation with an ENT doctor to look into what is causing and what can be done for your left ear hearing loss that seems to be coming for damage to the ear nerve.    Hand Dermatitis Hand dermatitis (dyshidrotic eczema) is a skin condition in which small, itchy, raised dots or fluid-filled blisters form over the palms of the hands. Outbreaks of hand dermatitis can last 3 to 4 weeks. CAUSES  The cause of hand dermatitis is unknown. However, it occurs most often in patients with a history of allergies such as:  Hay fever.  Allergic asthma.  Allergies to latex. Chemical exposure, injuries, and environmental irritants can make hand dermatitis worse. Washing your hands too frequently can remove natural oils, which can dry out the skin and contribute to outbreaks of hand dermatitis. SYMPTOMS  The most common symptom of hand dermatitis is intense itching. Cracks or grooves (fissures) on the fingers can also develop. Affected areas can be painful, especially areas where large blisters have formed. DIAGNOSIS Your caregiver can usually tell what the problem is by doing a physical exam. PREVENTION  Avoid excessive hand washing.  Avoid the use of harsh chemicals.  Wear protective gloves when handling products that can irritate your skin. TREATMENT  Steroid creams and ointments, such as over-the-counter 1% hydrocortisone cream, can reduce inflammation and improve moisture retention. These should be applied at least 2 to 4 times per day. Your caregiver may ask you to use a stronger prescription steroid cream to help speed the healing of blistered and cracked skin. In severe cases, oral steroid medicine may be needed. If you have an infection, antibiotics may be needed. Your  caregiver may also prescribe antihistamines. These medicines help reduce itching. HOME CARE INSTRUCTIONS  Only take over-the-counter or prescription medicines as directed by your caregiver.  You may use wet or cold compresses. This can help:  Alleviate itching.  Increase the effectiveness of topical creams.  Minimize blisters. SEEK MEDICAL CARE IF:  The rash is not better after 1 week of treatment.  Signs of infection develop, such as redness, tenderness, or yellowish-white fluid (pus).  The rash is spreading.   This information is not intended to replace advice given to you by your health care provider. Make sure you discuss any questions you have with your health care provider.   Document Released: 05/04/2005 Document Revised: 07/27/2011 Document Reviewed: 11/16/2014 Elsevier Interactive Patient Education [employer].

## 2016-02-28 ENCOUNTER — Encounter: Payer: Self-pay | Admitting: Family Medicine

## 2016-02-28 NOTE — Progress Notes (Signed)
Subjective:    Patient ID: Lauren [name], female    DOB: 10-01-1950, 65 y.o.   MRN: [id number] Lauren [name] is alone Sources of clinical information for visit is/are patient and past medical records. Nursing assessment for this office visit was reviewed with the patient for accuracy and revision.  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 Onset of symptoms was unknown, found on screening audiogram during Medicare AWV.  She denies symptoms include: loss of high pitch perception, loss of low pitch perception, difficult with voices in crowded rooms and associated tinnitus There is not a history of noise exposure. There is not a family history of early hearing loss.  Physical: No occlusion of EAC.  Audiogram showed again the low frequency loss in the left ear. No localization of Weber sign Rhinne in left ear AC > BC, right ear with AC> BC  SH: Smoking status noted.  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 female who complains of a rash. Symptoms began greater than 6 months ago. Patient describes the rash as erythematous, vesicles. Characteristics of rash and associated history: Is rash pruritic?  yes, Is rash painful?  no, Alleviating factors? moisturing cream application, Denies associated:fever, GI symptoms, joint pain, mouth sores, weight loss, Skin exposure to potential irritants at home or work or from  hobbies? Chemotherapeutic agents, rash started after beginning "immunotherapy" for lung cancer. Patient denies prior skin problems. Family history of derm problems: no. Medications currently using: moisturizing cream. Environmental exposures or allergies: other chemicals Chemotherapy with nivolumab    Dr Alvy Bimler assessed the hand rash in May when he diagnosed contact dermatitis which he treated with OTC hydrocortisone and observations.  Rash improved per his eval in August. Moisturizer.   Review of Systems Constitutional: negative for fevers and weight loss    Objective:    BP (!) 148/80   Pulse 74   Temp 98.2 F (36.8 C) (Oral)   Ht '5\' 4"'$  (1.626 m)   Wt 168 lb (76.2 kg)   BMI 28.84 kg/m  Physical Exam  General:  alert, cooperative and appears stated age  HEENT:  dentures, no lesions No wax occulsion of EAC, normal TMs with LR  Lymph Nodes:  Cervical, supraclavicular, and axillary nodes normal.  Lungs:  clear to auscultation bilaterally  Heart:  regular rate and rhythm, S1, S2 normal, no murmur, click, rub or gallop  Extremities:  extremities normal, atraumatic, no cyanosis or edema  Skin:   Rash located on the hands.   Shape:   flat   Consistency:   soft  Color:   Erythematous with a pronounced border  Type: patch(es) - hand(s) bilateral palms with macerated skin appearance in webs of fingers Deep papules in thenar and hypothenar skin bilat        Assessment:  Differential Diagnosis of hand dermatitis: contact, mechanical, allergic, infectious (fungal),  Working Diagnosis of Tinea Manu and Tinea Pedis - Start Clotrimazone TID to skin of hands and feet for next month.   If unresponsive will use a high potency glucocorticoid -

## 2016-03-04 ENCOUNTER — Ambulatory Visit: Payer: Self-pay | Admitting: Hematology and Oncology

## 2016-03-04 ENCOUNTER — Other Ambulatory Visit: Payer: Self-pay

## 2016-03-04 ENCOUNTER — Ambulatory Visit: Payer: Self-pay

## 2016-03-05 ENCOUNTER — Other Ambulatory Visit: Payer: Self-pay

## 2016-03-05 ENCOUNTER — Other Ambulatory Visit (HOSPITAL_BASED_OUTPATIENT_CLINIC_OR_DEPARTMENT_OTHER): Payer: Commercial Managed Care - HMO

## 2016-03-05 ENCOUNTER — Telehealth: Payer: Self-pay | Admitting: *Deleted

## 2016-03-05 ENCOUNTER — Ambulatory Visit (HOSPITAL_BASED_OUTPATIENT_CLINIC_OR_DEPARTMENT_OTHER): Payer: Commercial Managed Care - HMO

## 2016-03-05 ENCOUNTER — Ambulatory Visit: Payer: Commercial Managed Care - HMO

## 2016-03-05 ENCOUNTER — Ambulatory Visit (HOSPITAL_BASED_OUTPATIENT_CLINIC_OR_DEPARTMENT_OTHER): Payer: Commercial Managed Care - HMO | Admitting: Hematology and Oncology

## 2016-03-05 ENCOUNTER — Encounter: Payer: Self-pay | Admitting: Hematology and Oncology

## 2016-03-05 VITALS — BP 155/69 | HR 74 | Temp 98.0°F | Ht 64.0 in | Wt 168.4 lb

## 2016-03-05 VITALS — BP 123/62 | HR 76 | Temp 98.4°F | Resp 20

## 2016-03-05 DIAGNOSIS — Z5111 Encounter for antineoplastic chemotherapy: Secondary | ICD-10-CM

## 2016-03-05 DIAGNOSIS — R600 Localized edema: Secondary | ICD-10-CM | POA: Diagnosis not present

## 2016-03-05 DIAGNOSIS — R0789 Other chest pain: Secondary | ICD-10-CM

## 2016-03-05 DIAGNOSIS — Z5181 Encounter for therapeutic drug level monitoring: Secondary | ICD-10-CM

## 2016-03-05 DIAGNOSIS — C3412 Malignant neoplasm of upper lobe, left bronchus or lung: Secondary | ICD-10-CM

## 2016-03-05 DIAGNOSIS — R21 Rash and other nonspecific skin eruption: Secondary | ICD-10-CM

## 2016-03-05 DIAGNOSIS — C7931 Secondary malignant neoplasm of brain: Secondary | ICD-10-CM

## 2016-03-05 DIAGNOSIS — E46 Unspecified protein-calorie malnutrition: Secondary | ICD-10-CM

## 2016-03-05 DIAGNOSIS — Z5112 Encounter for antineoplastic immunotherapy: Secondary | ICD-10-CM

## 2016-03-05 DIAGNOSIS — L271 Localized skin eruption due to drugs and medicaments taken internally: Secondary | ICD-10-CM | POA: Diagnosis not present

## 2016-03-05 DIAGNOSIS — C7949 Secondary malignant neoplasm of other parts of nervous system: Secondary | ICD-10-CM

## 2016-03-05 DIAGNOSIS — C342 Malignant neoplasm of middle lobe, bronchus or lung: Secondary | ICD-10-CM | POA: Diagnosis not present

## 2016-03-05 DIAGNOSIS — Z79899 Other long term (current) drug therapy: Secondary | ICD-10-CM | POA: Diagnosis not present

## 2016-03-05 DIAGNOSIS — F17200 Nicotine dependence, unspecified, uncomplicated: Secondary | ICD-10-CM

## 2016-03-05 DIAGNOSIS — J449 Chronic obstructive pulmonary disease, unspecified: Secondary | ICD-10-CM

## 2016-03-05 DIAGNOSIS — I209 Angina pectoris, unspecified: Secondary | ICD-10-CM

## 2016-03-05 DIAGNOSIS — Z720 Tobacco use: Secondary | ICD-10-CM

## 2016-03-05 DIAGNOSIS — I10 Essential (primary) hypertension: Secondary | ICD-10-CM

## 2016-03-05 DIAGNOSIS — E441 Mild protein-calorie malnutrition: Secondary | ICD-10-CM

## 2016-03-05 DIAGNOSIS — Z85038 Personal history of other malignant neoplasm of large intestine: Secondary | ICD-10-CM

## 2016-03-05 HISTORY — DX: Angina pectoris, unspecified: I20.9

## 2016-03-05 LAB — COMPREHENSIVE METABOLIC PANEL
AST: 10 U/L (ref 5–34)
Albumin: 2.8 g/dL — ABNORMAL LOW (ref 3.5–5.0)
Alkaline Phosphatase: 150 U/L (ref 40–150)
Anion Gap: 8 mEq/L (ref 3–11)
BILIRUBIN TOTAL: 0.44 mg/dL (ref 0.20–1.20)
BUN: 9.5 mg/dL (ref 7.0–26.0)
CHLORIDE: 108 meq/L (ref 98–109)
CO2: 26 meq/L (ref 22–29)
CREATININE: 1.2 mg/dL — AB (ref 0.6–1.1)
Calcium: 9.1 mg/dL (ref 8.4–10.4)
EGFR: 54 mL/min/{1.73_m2} — ABNORMAL LOW (ref >90)
GLUCOSE: 88 mg/dL (ref 70–140)
Potassium: 4.3 mEq/L (ref 3.5–5.1)
SODIUM: 141 meq/L (ref 136–145)
TOTAL PROTEIN: 7.3 g/dL (ref 6.4–8.3)

## 2016-03-05 LAB — TSH: TSH: 1.21 m(IU)/L (ref 0.308–3.960)

## 2016-03-05 LAB — CBC WITH DIFFERENTIAL/PLATELET
BASO%: 0.3 % (ref 0.0–2.0)
Basophils Absolute: 0 10*3/uL (ref 0.0–0.1)
EOS%: 1 % (ref 0.0–7.0)
Eosinophils Absolute: 0.1 10*3/uL (ref 0.0–0.5)
HCT: 41.3 % (ref 34.8–46.6)
HGB: 12.9 g/dL (ref 11.6–15.9)
LYMPH%: 14.4 % (ref 14.0–49.7)
MCH: 23.8 pg — ABNORMAL LOW (ref 25.1–34.0)
MCHC: 31.2 g/dL — AB (ref 31.5–36.0)
MCV: 76.1 fL — AB (ref 79.5–101.0)
MONO#: 0.6 10*3/uL (ref 0.1–0.9)
MONO%: 9.2 % (ref 0.0–14.0)
NEUT#: 4.7 10*3/uL (ref 1.5–6.5)
NEUT%: 75.1 % (ref 38.4–76.8)
PLATELETS: 192 10*3/uL (ref 145–400)
RBC: 5.43 10*6/uL (ref 3.70–5.45)
RDW: 17.7 % — ABNORMAL HIGH (ref 11.2–14.5)
WBC: 6.3 10*3/uL (ref 3.9–10.3)
lymph#: 0.9 10*3/uL (ref 0.9–3.3)

## 2016-03-05 MED ORDER — TIZANIDINE HCL 2 MG PO TABS: 2.0000 mg | ORAL_TABLET | Freq: Four times a day (QID) | ORAL | 3 refills | Status: DC | PRN

## 2016-03-05 MED ORDER — SODIUM CHLORIDE 0.9% FLUSH
10.0000 mL | INTRAVENOUS | Status: DC | PRN
  Administered 2016-03-05: 10 mL
  Filled 2016-03-05: qty 10

## 2016-03-05 MED ORDER — SODIUM CHLORIDE 0.9 % IV SOLN
240.0000 mg | Freq: Once | INTRAVENOUS | Status: AC
  Administered 2016-03-05: 240 mg via INTRAVENOUS
  Filled 2016-03-05: qty 4

## 2016-03-05 MED ORDER — ALBUTEROL SULFATE HFA 108 (90 BASE) MCG/ACT IN AERS: 1.0000 | INHALATION_SPRAY | Freq: Two times a day (BID) | RESPIRATORY_TRACT | 6 refills | Status: DC

## 2016-03-05 MED ORDER — NITROGLYCERIN 0.4 MG SL SUBL
0.4000 mg | SUBLINGUAL_TABLET | SUBLINGUAL | Status: AC
  Administered 2016-03-05: 0.4 mg via SUBLINGUAL

## 2016-03-05 MED ORDER — HEPARIN SOD (PORK) LOCK FLUSH 100 UNIT/ML IV SOLN
500.0000 [IU] | Freq: Once | INTRAVENOUS | Status: AC | PRN
  Administered 2016-03-05: 500 [IU]
  Filled 2016-03-05: qty 5

## 2016-03-05 MED ORDER — ALTEPLASE 2 MG IJ SOLR
2.0000 mg | Freq: Once | INTRAMUSCULAR | Status: DC | PRN
  Filled 2016-03-05: qty 2

## 2016-03-05 MED ORDER — SODIUM CHLORIDE 0.9 % IV SOLN
Freq: Once | INTRAVENOUS | Status: AC
  Administered 2016-03-05: 13:00:00 via INTRAVENOUS

## 2016-03-05 MED ORDER — NITROGLYCERIN 0.4 MG SL SUBL
SUBLINGUAL_TABLET | SUBLINGUAL | Status: AC
  Filled 2016-03-05: qty 1

## 2016-03-05 MED ORDER — SODIUM CHLORIDE 0.9 % IJ SOLN
10.0000 mL | INTRAMUSCULAR | Status: DC | PRN
  Administered 2016-03-05: 10 mL via INTRAVENOUS
  Filled 2016-03-05: qty 10

## 2016-03-05 NOTE — Telephone Encounter (Signed)
Contacted the pt to inform her that Dr Alvy Bimler reached out to Dr Meda Coffee today, in regards to the pt having angina today, which was resolved with Nitroglycerin.  Informed the pt that both Dr Alvy Bimler and Dr Meda Coffee agree that she should be seen in our clinic sometime next week.  Informed the pt that per Dr Meda Coffee, it is acceptable for the pt to see an Extender in our office next week.  Scheduled the pt to see Ellen Henri PA-C for next Wednesday 10/25 at 3:30 pm.  Advised the pt to arrive 15 minutes early to this appt.  Pt verbalized understanding and agrees with this plan.  Will route this message to Dr Alvy Bimler and Ellen Henri PA-C as a general FYI.

## 2016-03-05 NOTE — Progress Notes (Signed)
Pt had episode of acute chest pain upon arrival to infusion room.  EKG obtained with NSR.  Dr Alvy Bimler notified and came to see pt chair side.  1 dose of nitroglycerin given.  VS remained stable with initial BP elevation coming down.  Once CP resolved, Dr Alvy Bimler gave OK to follow through with nivolumab treatment.  Pt has also been instructed to follow up with the cardiologist that she saw a few years ago per Dr Alvy Bimler.

## 2016-03-05 NOTE — Progress Notes (Signed)
There was small blood return after flushing once, blood then stopped. Flushed port several times still no blood return. Pt had blood drawn peripherally from phlebotimist. Alvy Bimler desk nurse called and made aware. Cathflow was released to be administered.

## 2016-03-05 NOTE — Assessment & Plan Note (Signed)
There could be an element of depression. I recommend she takes Remeron consistently. She will continue to increase oral intake as tolerated.

## 2016-03-05 NOTE — Assessment & Plan Note (Signed)
It is very peculiar as it only affects her hands and her feet. She is quite asymptomatic. It has improved since I put her on topical cream. 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 I will like to see if her symptoms improved while on prednisone therapy. I recommend we space out her treatment every 3 weeks and she agreed

## 2016-03-05 NOTE — Assessment & Plan Note (Signed)
She has excellent response to treatment. Recent CT scan in July showed complete resolution of the right upper lung nodule. 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

## 2016-03-05 NOTE — Assessment & Plan Note (Signed)
She has bilateral lower extremity edema, left greater than the right. This is likely related to low albumin with poor circulation. She had colon cancer and some of the lymph nodes were removed. Lasix is not helping, rightfully so since this is not congestive heart failure. The Lasix has caused significant hypotension and dizziness. Continue close monitoring only She is relatively asymptomatic

## 2016-03-05 NOTE — Assessment & Plan Note (Signed)
Examination is benign. I think her productive cough is due to chronic COPD. The patient is advised to quit smoking. I recommend over-the-counter decongestants

## 2016-03-05 NOTE — Assessment & Plan Note (Signed)
I spent some time counseling the patient the importance of tobacco cessation. She is currently not interested to quit now. 

## 2016-03-05 NOTE — Assessment & Plan Note (Signed)
Her last CT imaging of the abdomen show no evidence of recurrence. She is due for surveillance colonoscopy and I will refer her back to GI. Her last colonoscopy was in 2012

## 2016-03-05 NOTE — Addendum Note (Signed)
Addended by: Carlene Coria L on: 03/05/2016 10:39 AM   Modules accepted: Orders

## 2016-03-05 NOTE — Patient Instructions (Signed)
[facility] Discharge Instructions for Patients Receiving Chemotherapy  Today you received the following chemotherapy agents:  Opdivo  To help prevent nausea and vomiting after your treatment, we encourage you to take your nausea medication as prescribed.   If you develop nausea and vomiting that is not controlled by your nausea medication, call the clinic.   BELOW ARE SYMPTOMS THAT SHOULD BE REPORTED IMMEDIATELY:  *FEVER GREATER THAN 100.5 F  *CHILLS WITH OR WITHOUT FEVER  NAUSEA AND VOMITING THAT IS NOT CONTROLLED WITH YOUR NAUSEA MEDICATION  *UNUSUAL SHORTNESS OF BREATH  *UNUSUAL BRUISING OR BLEEDING  TENDERNESS IN MOUTH AND THROAT WITH OR WITHOUT PRESENCE OF ULCERS  *URINARY PROBLEMS  *BOWEL PROBLEMS  UNUSUAL RASH Items with * indicate a potential emergency and should be followed up as soon as possible.  Feel free to call the clinic you have any questions or concerns. The clinic phone number is [phone].  Please show the CHEMO ALERT CARD at check-in to the Emergency Department and triage nurse.   

## 2016-03-05 NOTE — Assessment & Plan Note (Signed)
Her most recent MRI in September 2017 showed no evidence of disease recurrence. Continue to monitor carefully.

## 2016-03-05 NOTE — Progress Notes (Signed)
[facility] OFFICE PROGRESS NOTE  Patient Care Team: [name], MD as PCP - General (Family Medicine) Gaye Pollack, MD (Cardiothoracic Surgery) Dickie La, MD (Family Medicine) Heath Lark, MD as Consulting Physician (Hematology and Oncology) Verlin Grills, RN as [facility] Leighton Ruff, OD (Optometry) Milus Banister, MD as Consulting Physician (Gastroenterology)  SUMMARY OF ONCOLOGIC HISTORY: Oncology History   Colon cancer   Primary site: Colon and Rectum (Left)   Staging method: AJCC 7th Edition   Clinical: Stage I (T2, N0, M0) signed by Heath Lark, MD on 05/31/2013  2:39 PM   Pathologic: Stage I (T2, N0, cM0) signed by Heath Lark, MD on 05/31/2013  2:39 PM   Summary: Stage I (T2, N0, cM0) Lung cancer, EGFR/ALK negative, recurrence after initial resection to LN and brain   Primary site: Lung (Left)   Staging method: AJCC 7th Edition   Clinical: Stage IV (T1, N2, M1b) signed by Heath Lark, MD on 05/31/2013  2:26 PM   Pathologic: Stage IV (T1, N2, M1b) signed by Heath Lark, MD on 05/31/2013  2:26 PM   Summary: Stage IV (T1, N2, M1b)       Cancer of upper lobe of left lung, Adenocarcinoma   03/01/2007 Procedure    Colonoscopy revealed abnormalities and biopsy show high-grade dysplasia      04/01/2007 Surgery    She underwent sigmoid resection which showed T2 N0 colon cancer, and negative margins and all of 17 lymph nodes were negative      02/29/2008 Procedure    Repeat surveillance colonoscopy was negative.      06/16/2010 Surgery    She underwent left upper lobectomy we show well-differentiated adenocarcinoma of the lung, T1, N0, M0      03/18/2011 Procedure    Repeat colonoscopy show multiple polyps but there were benign      10/19/2011 Procedure    Biopsy of mediastinal lymph node came back positive for recurrence of lung cancer, EGFR and ALK negative      11/16/2011 - 12/14/2011 Chemotherapy    She  received concurrent chemoradiation therapy with weekly carboplatin and Taxol.      11/16/2011 - 12/29/2011 Radiation Therapy    She received radiation therapy with weekly chemotherapy      02/15/2012 Imaging    MR of the brain showed a new intracranial metastases. This was subsequently treated with stereotactic radiosurgery.      03/07/2012 - 04/12/2013 Chemotherapy    She received chemotherapy with maintainence Alimta every 3 weeks. Chemotherapy was discontinued due to profound fatigue      03/02/2013 Procedure    She had therapeutic ultrasound guidance thoracentesis for pleural effusion that came back negative for cancer      05/04/2013 Procedure    She had repeat ultrasound-guided thoracentesis again and cytology was negative      05/29/2013 Imaging    Repeat CT scan of the chest, abdomen and pelvis show no evidence of disease but persistent right-sided pleural effusion      06/02/2013 Surgery    The patient had placement of Pleurx catheter and subsequently underwent pleurodesis.      09/22/2013 Imaging    Repeat CT scan show no evidence of active disease. There are nonspecific lymphadenopathy and she is placed on observation.      03/23/2014 Imaging    Repeat CT scan of the chest, abdomen and pelvis show recurrence of cancer with widespread bilateral pulmonary metastasis.  05/14/2014 Imaging    Imaging of the chest and brain were repeated due to delay of initiation of chemotherapy. Overall, chest CT scan show stable disease. MRI of the head was negative for recurrence      05/16/2014 - 07/18/2014 Chemotherapy     she completed 4 cycles of combination chemotherapy with carboplatin and Alimta      07/16/2014 Imaging     repeat CT scan of the chest, abdomen and pelvis show regression in the size of lung nodules.      08/29/2014 - 08/14/2015 Chemotherapy    She received maintenance Alimta      10/16/2014 Imaging     repeat CT scan of the chest, abdomen and pelvis show  regression in the size of lung nodules.      01/29/2015 Imaging    Repeat CT scan showed stable disease      02/01/2015 Imaging    MRI brain is negative      04/30/2015 Imaging    CT scan of the chest, abdomen and pelvis showed stable disease      07/25/2015 Imaging    MRI brain showed no evidence of new disease      09/09/2015 Imaging    CT chest showed interval increase in size of large RIGHT upper lobe nodule is most consistent with lung cancer recurrence.      09/18/2015 -  Chemotherapy    She started on Nivolumab      10/22/2015 Imaging    Screening mammogram showed mild abnormality      10/31/2015 Imaging    Diagnostic mammogram showed mild calcification at the right upper outer breast      11/27/2015 Imaging    Stable post treatment related changes of left lower lobectomy and radiation therapy redemonstrated, as above. No definite findings to suggest local recurrence of disease or metastatic disease on today's examination      01/24/2016 Imaging    MR brain showed continued stable appearance of two small treated brain metastases. No new or progressive metastatic disease to the brain.       Brain metastases treated with radiosurgery   02/26/2012 Initial Diagnosis    Brain metastases treated with radiosurgery       INTERVAL HISTORY: Please see below for problem oriented charting. She returns prior to cycle 12 of treatment She continues to have chronic cough with mucous production. No hemoptysis. No recent fever or chills. She continues to smoke. She is wondering about surveillance colonoscopy. She uses her inhalers on a regular basis Denies new neurological deficit. She was recently treated with topical cream for possible yeast infection on her hands.  REVIEW OF SYSTEMS:   Constitutional: Denies fevers, chills or abnormal weight loss Eyes: Denies blurriness of vision Ears, nose, mouth, throat, and face: Denies mucositis or sore throat Cardiovascular: Denies  palpitation, chest discomfort or lower extremity swelling Gastrointestinal:  Denies nausea, heartburn or change in bowel habits Lymphatics: Denies new lymphadenopathy or easy bruising Neurological:Denies numbness, tingling or new weaknesses Behavioral/Psych: Mood is stable, no new changes  All other systems were reviewed with the patient and are negative.  I have reviewed the past medical history, past surgical history, social history and family history with the patient and they are unchanged from previous note.  ALLERGIES:  is allergic to adhesive [tape] and lisinopril.  MEDICATIONS:  Current Outpatient Prescriptions  Medication Sig Dispense Refill  . albuterol (PROVENTIL HFA;VENTOLIN HFA) 108 (90 Base) MCG/ACT inhaler Inhale 1 puff into the lungs 2 (  two) times daily. 6.7 g 6  . aspirin 81 MG chewable tablet Chew 81 mg by mouth daily.    Marland Kitchen BYSTOLIC 10 MG tablet TAKE 1 TABLET (10 MG TOTAL) BY MOUTH DAILY. 90 tablet 3  . CAPSAICIN EX Apply topically as needed. To feet    . cholecalciferol (VITAMIN D) 1000 units tablet Take 1,000 Units by mouth daily.    . clotrimazole (LOTRIMIN) 1 % cream Apply to skin of hands, wrist, and feet three times a day. Continue for one month. 113 g 0  . COLACE 100 MG capsule Take 100 mg by mouth daily.    . diclofenac sodium (VOLTAREN) 1 % GEL Apply 2 g topically 4 (four) times daily. As needed for pain 100 g 3  . gabapentin (NEURONTIN) 300 MG capsule Take 1 capsule (300 mg total) by mouth 3 (three) times daily. (Patient taking differently: Take 300 mg by mouth daily. ) 90 capsule 1  . HYDROcodone-homatropine (HYCODAN) 5-1.5 MG/5ML syrup Take 5 mLs by mouth every 6 (six) hours as needed for cough. 240 mL 0  . lidocaine-prilocaine (EMLA) cream Apply topically as needed. Apply to porta cath site one hour prior to needle stick. 30 g 6  . nitroGLYCERIN (NITROSTAT) 0.4 MG SL tablet Place 1 tablet (0.4 mg total) under the tongue every 5 (five) minutes as needed. For  chest pain 25 tablet 5  . omeprazole (PRILOSEC) 40 MG capsule TAKE 1 CAPSULE EVERY DAY 90 capsule prn  . potassium chloride SA (K-DUR,KLOR-CON) 20 MEQ tablet TAKE 1 TABLET TWICE DAILY 180 tablet 0  . pravastatin (PRAVACHOL) 40 MG tablet TAKE 1 TABLET ONE TIME DAILY 90 tablet 3  . tiZANidine (ZANAFLEX) 2 MG tablet Take 1 tablet (2 mg total) by mouth every 6 (six) hours as needed (Back Muscle Spasm). 30 tablet 3  . traMADol (ULTRAM) 50 MG tablet Take 1 tablet (50 mg total) by mouth every 6 (six) hours as needed for moderate pain or severe pain. 90 tablet 1  . umeclidinium-vilanterol (ANORO ELLIPTA) 62.5-25 MCG/INH AEPB Inhale 1 puff into the lungs daily. 1 each 0   No current facility-administered medications for this visit.    Facility-Administered Medications Ordered in Other Visits  Medication Dose Route Frequency Provider Last Rate Last Dose  . 0.9 %  sodium chloride infusion   Intravenous Once Heath Lark, MD      . heparin lock flush 100 unit/mL  500 Units Intracatheter Once PRN Heath Lark, MD      . nivolumab (OPDIVO) 240 mg in sodium chloride 0.9 % 100 mL chemo infusion  240 mg Intravenous Once Heath Lark, MD      . sodium chloride 0.9 % injection 10 mL  10 mL Intravenous PRN Heath Lark, MD   10 mL at 09/18/15 1345  . sodium chloride flush (NS) 0.9 % injection 10 mL  10 mL Intracatheter PRN Heath Lark, MD        PHYSICAL EXAMINATION: ECOG PERFORMANCE STATUS: 1 - Symptomatic but completely ambulatory  Vitals:   03/05/16 1049  BP: (!) 155/69  Pulse: 74  Temp: 98 F (36.7 C)   Filed Weights   03/05/16 1049  Weight: 168 lb 6.4 oz (76.4 kg)    GENERAL:alert, no distress and comfortable SKIN: Her skin lesions are mildly improved EYES: normal, Conjunctiva are pink and non-injected, sclera clear OROPHARYNX:no exudate, no erythema and lips, buccal mucosa, and tongue normal  NECK: supple, thyroid normal size, non-tender, without nodularity LYMPH:  no palpable lymphadenopathy  in the  cervical, axillary or inguinal LUNGS: clear to auscultation and percussion with normal breathing effort HEART: regular rate & rhythm and no murmurs and no lower extremity edema ABDOMEN:abdomen soft, non-tender and normal bowel sounds Musculoskeletal:no cyanosis of digits and no clubbing  NEURO: alert & oriented x 3 with fluent speech, no focal motor/sensory deficits  LABORATORY DATA:  I have reviewed the data as listed    Component Value Date/Time   NA 141 03/05/2016 0951   K 4.3 03/05/2016 0951   CL 106 12/26/2013 0946   CL 102 10/24/2012 1001   CO2 26 03/05/2016 0951   GLUCOSE 88 03/05/2016 0951   GLUCOSE 111 (H) 10/24/2012 1001   BUN 9.5 03/05/2016 0951   CREATININE 1.2 (H) 03/05/2016 0951   CALCIUM 9.1 03/05/2016 0951   PROT 7.3 03/05/2016 0951   ALBUMIN 2.8 (L) 03/05/2016 0951   AST 10 03/05/2016 0951   ALT <3 03/05/2016 0951   ALKPHOS 150 03/05/2016 0951   BILITOT 0.44 03/05/2016 0951   GFRNONAA 74 (L) 06/12/2013 0625   GFRAA 86 (L) 06/12/2013 0625    No results found for: SPEP, UPEP  Lab Results  Component Value Date   WBC 6.3 03/05/2016   NEUTROABS 4.7 03/05/2016   HGB 12.9 03/05/2016   HCT 41.3 03/05/2016   MCV 76.1 (L) 03/05/2016   PLT 192 03/05/2016      Chemistry      Component Value Date/Time   NA 141 03/05/2016 0951   K 4.3 03/05/2016 0951   CL 106 12/26/2013 0946   CL 102 10/24/2012 1001   CO2 26 03/05/2016 0951   BUN 9.5 03/05/2016 0951   CREATININE 1.2 (H) 03/05/2016 0951      Component Value Date/Time   CALCIUM 9.1 03/05/2016 0951   ALKPHOS 150 03/05/2016 0951   AST 10 03/05/2016 0951   ALT <3 03/05/2016 0951   BILITOT 0.44 03/05/2016 0951     ASSESSMENT & PLAN:  Cancer of upper lobe of left lung, Adenocarcinoma She has excellent response to treatment. Recent CT scan in July showed complete resolution of the right upper lung nodule. She tolerated treatment well except that she does have hand-foot syndrome I recommend we space out  her treatment to every 3 weeks and I plan to recheck CT scan again in November 2017  Brain metastases treated with radiosurgery Her most recent MRI in September 2017 showed no evidence of disease recurrence. Continue to monitor carefully.  Bilateral leg edema She has bilateral lower extremity edema, left greater than the right. This is likely related to low albumin with poor circulation. She had colon cancer and some of the lymph nodes were removed. Lasix is not helping, rightfully so since this is not congestive heart failure. The Lasix has caused significant hypotension and dizziness. Continue close monitoring only She is relatively asymptomatic  Protein calorie malnutrition ([place]) There could be an element of depression. I recommend she takes Remeron consistently. She will continue to increase oral intake as tolerated.  TOBACCO ABUSE I spent some time counseling the patient the importance of tobacco cessation. She is currently not interested to quit now.  Skin rash It is very peculiar as it only affects her hands and her feet. She is quite asymptomatic. It has improved since I put her on topical cream. 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 I will like to see if her symptoms improved while on prednisone therapy. I recommend we space out her treatment  every 3 weeks and she agreed  COPD mixed type [facility]) Examination is benign. I think her productive cough is due to chronic COPD. The patient is advised to quit smoking. I recommend over-the-counter decongestants  History of colon cancer Her last CT imaging of the abdomen show no evidence of recurrence. She is due for surveillance colonoscopy and I will refer her back to GI. Her last colonoscopy was in 2012  Angina pectoris [facility]) After completion of visit, the patient has an episode of angina symptoms in the infusion room prior to her treatment. Vitals are stable except for mild hypertension and EKG were  normal She was given 1 dose of nitroglycerin with complete resolution of her symptoms. Again, I recommend the patient to stop smoking and to continue aspirin. I will alert her cardiologist for return appointment in the near future   Orders Placed This Encounter  Procedures  . CT CHEST W CONTRAST    Standing Status:   Future    Standing Expiration Date:   04/09/2017    Order Specific Question:   Reason for exam:    Answer:   lung ca, exclude recurrence    Order Specific Question:   Preferred imaging location?    Answer:   [facility]  . Ambulatory referral to Gastroenterology    Referral Priority:   Routine    Referral Type:   Consultation    Referral Reason:   Specialty Services Required    Number of Visits Requested:   1   All questions were answered. The patient knows to call the clinic with any problems, questions or concerns. No barriers to learning was detected. I spent 25 minutes counseling the patient face to face. The total time spent in the appointment was 40 minutes and more than 50% was on counseling and review of test results     Heath Lark, MD 03/05/2016 12:29 PM

## 2016-03-05 NOTE — Assessment & Plan Note (Signed)
After completion of visit, the patient has an episode of angina symptoms in the infusion room prior to her treatment. Vitals are stable except for mild hypertension and EKG were normal She was given 1 dose of nitroglycerin with complete resolution of her symptoms. Again, I recommend the patient to stop smoking and to continue aspirin. I will alert her cardiologist for return appointment in the near future

## 2016-03-05 NOTE — Telephone Encounter (Signed)
-----   Message from Dorothy Spark, MD sent at 03/05/2016  1:28 PM EDT ----- Regarding: FW: mutual patient  Ivy, Please schedule her to see a PA next week. Thank you, Ena Dawley    ----- Message ----- From: Heath Lark, MD Sent: 03/05/2016  12:30 PM To: Dorothy Spark, MD Subject: mutual patient                                 Hi,  Hope you are well. This patient has angina today, resolved with nitroglycerin. Can you see her back soon? Thanks, Ni

## 2016-03-09 ENCOUNTER — Other Ambulatory Visit: Payer: Self-pay | Admitting: *Deleted

## 2016-03-09 ENCOUNTER — Encounter: Payer: Self-pay | Admitting: Cardiology

## 2016-03-09 ENCOUNTER — Encounter: Payer: Self-pay | Admitting: *Deleted

## 2016-03-09 NOTE — Patient Outreach (Signed)
[name]) Care Management  03/09/2016   [name] 1950/07/31 [id number]  Subjective: RN Health Coach telephone call to patient.  [employer] compliance verified.Per patient she is doing pretty good. Her appetite is fair. Patient stated she is having episodes of heavy coughing with phlegm coming up. No yellow tinge or blood noted. Patient is still smoking. Patient has made Dr aware and was give cough medicine. Per patient the hydrocodone in it make her feel fuzzy. Patient ordered over the counter cough medicine that is compatible for hypertension. Patient does have some left lower extremity edema. Per patient she had some chest pain at the oncology office and is to follow up with cardiologist. Per patient she had an ear evaluation and has scheduled her colonoscopy. Patient has agreed to follow up outreach call.    Objective:   Current Medications:  Current Outpatient Prescriptions  Medication Sig Dispense Refill  . albuterol (PROVENTIL HFA;VENTOLIN HFA) 108 (90 Base) MCG/ACT inhaler Inhale 1 puff into the lungs 2 (two) times daily. 6.7 g 6  . aspirin 81 MG chewable tablet Chew 81 mg by mouth daily.    Marland Kitchen BYSTOLIC 10 MG tablet TAKE 1 TABLET (10 MG TOTAL) BY MOUTH DAILY. 90 tablet 3  . CAPSAICIN EX Apply topically as needed. To feet    . cholecalciferol (VITAMIN D) 1000 units tablet Take 1,000 Units by mouth daily.    . clotrimazole (LOTRIMIN) 1 % cream Apply to skin of hands, wrist, and feet three times a day. Continue for one month. 113 g 0  . COLACE 100 MG capsule Take 100 mg by mouth daily.    . diclofenac sodium (VOLTAREN) 1 % GEL Apply 2 g topically 4 (four) times daily. As needed for pain 100 g 3  . gabapentin (NEURONTIN) 300 MG capsule Take 1 capsule (300 mg total) by mouth 3 (three) times daily. (Patient taking differently: Take 300 mg by mouth daily. ) 90 capsule 1  . HYDROcodone-homatropine (HYCODAN) 5-1.5 MG/5ML syrup Take 5 mLs by mouth every 6 (six) hours as  needed for cough. 240 mL 0  . lidocaine-prilocaine (EMLA) cream Apply topically as needed. Apply to porta cath site one hour prior to needle stick. 30 g 6  . nitroGLYCERIN (NITROSTAT) 0.4 MG SL tablet Place 1 tablet (0.4 mg total) under the tongue every 5 (five) minutes as needed. For chest pain 25 tablet 5  . omeprazole (PRILOSEC) 40 MG capsule TAKE 1 CAPSULE EVERY DAY 90 capsule prn  . potassium chloride SA (K-DUR,KLOR-CON) 20 MEQ tablet TAKE 1 TABLET TWICE DAILY 180 tablet 0  . pravastatin (PRAVACHOL) 40 MG tablet TAKE 1 TABLET ONE TIME DAILY 90 tablet 3  . tiZANidine (ZANAFLEX) 2 MG tablet Take 1 tablet (2 mg total) by mouth every 6 (six) hours as needed (Back Muscle Spasm). 30 tablet 3  . traMADol (ULTRAM) 50 MG tablet Take 1 tablet (50 mg total) by mouth every 6 (six) hours as needed for moderate pain or severe pain. 90 tablet 1  . umeclidinium-vilanterol (ANORO ELLIPTA) 62.5-25 MCG/INH AEPB Inhale 1 puff into the lungs daily. 1 each 0   No current facility-administered medications for this visit.    Facility-Administered Medications Ordered in Other Visits  Medication Dose Route Frequency Provider Last Rate Last Dose  . sodium chloride 0.9 % injection 10 mL  10 mL Intravenous PRN Heath Lark, MD   10 mL at 09/18/15 1345    Functional Status:  In your present state of health, do  you have any difficulty performing the following activities: 03/09/2016 02/10/2016  Hearing? Y N  Vision? Y N  Difficulty concentrating or making decisions? [name]  Walking or climbing stairs? Y Y  Dressing or bathing? N N  Doing errands, shopping? [name]  Preparing Food and eating ? N N  Using the Toilet? N N  In the past six months, have you accidently leaked urine? Y Y  Do you have problems with loss of bowel control? N N  Managing your Medications? N N  Managing your Finances? N N  Housekeeping or managing your Housekeeping? [name]  Some recent data might be hidden    Fall/Depression Screening: PHQ 2/9  Scores 03/09/2016 02/27/2016 02/10/2016 01/31/2016 01/23/2016 12/23/2015 11/26/2015  PHQ - 2 Score 0 0 0 0 1 0 0  PHQ- 9 Score - - - - - - 2  Exception Documentation - - - - - - -    Assessment:  Patient has a clear productive cough Patient continues to smoke Patient will benefit from [place] telephonic outreach for education and support for COPD self management.  Plan:  RN discussed quit smoking with patient RN sent ensure coupons RN encourage Health Maintenance checks RN sent educational material on bronchitis, community acquired pneumonia and  cold and flu RN will follow up outreach in the month of November  [name] [place] [facility] [phone]

## 2016-03-11 ENCOUNTER — Encounter: Payer: Self-pay | Admitting: Cardiology

## 2016-03-11 ENCOUNTER — Ambulatory Visit (INDEPENDENT_AMBULATORY_CARE_PROVIDER_SITE_OTHER): Payer: Commercial Managed Care - HMO | Admitting: Cardiology

## 2016-03-11 VITALS — BP 146/80 | HR 86 | Ht 64.0 in | Wt 170.0 lb

## 2016-03-11 DIAGNOSIS — I10 Essential (primary) hypertension: Secondary | ICD-10-CM | POA: Diagnosis not present

## 2016-03-11 DIAGNOSIS — I208 Other forms of angina pectoris: Secondary | ICD-10-CM | POA: Diagnosis not present

## 2016-03-11 DIAGNOSIS — E7800 Pure hypercholesterolemia, unspecified: Secondary | ICD-10-CM | POA: Diagnosis not present

## 2016-03-11 DIAGNOSIS — I2510 Atherosclerotic heart disease of native coronary artery without angina pectoris: Secondary | ICD-10-CM | POA: Diagnosis not present

## 2016-03-11 NOTE — Patient Instructions (Addendum)
Medication Instructions:  Your physician recommends that you continue on your current medications as directed. Please refer to the Current Medication list given to you today.   Labwork: None ordered  Testing/Procedures: None ordered  Follow-Up: Your physician recommends that you schedule a follow-up appointment in: [place]   Any Other Special Instructions Will Be Listed Below (If Applicable).     If you need a refill on your cardiac medications before your next appointment, please call your pharmacy.

## 2016-03-11 NOTE — Progress Notes (Signed)
03/11/2016 Lauren [name]   11-08-1950  [id number]  Primary Physician MCDIARMID,TODD D, MD Primary Cardiologist: Dr. 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 She underwent left upper lobectomy on 06/16/2010 for a Stage I (T1a, N0, M0) well differentiated adenocarcinoma of the lung. She developed a brain met in 01/2012 felt to be secondary to her lung cancer and underwent stereotactic radiosurgery followed by chemotherapy. She developed a right pleural effusion in October 2014 and had a thoracentesis removing 1.2 L of fluid that had suspicious atypical cell but not diagnostic of cancer.  She has been followed by Dr. Meda Coffee and previously by Dr. Verl Blalock. She notes Dr. Verl Blalock told her years ago that she had mild coronary disease. I don't have any records of this. She had a 2D echo in 2015 which showed preserved biventricular function and chamber size. No signs of heart failure. Normal RVSP. She was last seen by Dr. Meda Coffee in 2015. She has no known h/o CAD. she has not followed-up since.   She presents back for evaluation after having CP during a recent oncology visit 03/05/16. She is still being following oncology and underling treatment with immunotherapy. She has a port in her right upper chest. She was getting treatment through her port that day. The RN had difficulty injecting the agent. Shortly after, she developed SSCP, described as pressure/tightness. She was mildly diaphoretic. She was given SL NTG and the pain resolved. She was instructed to f/u in our clinic for further assessment. She had one recurrence of similar pain since that episode. She also notes exertional dyspnea. It is difficult for her to walk  up a flight of stairs w/o symptoms. She is a smoker and has been for 50 years. She also has a h/o HTN and HLD. Her EKG at rest today shows NSR w/o ischemia.   Current Meds  Medication Sig  . albuterol (PROVENTIL HFA;VENTOLIN HFA) 108 (90 Base) MCG/ACT inhaler Inhale 1 puff into the lungs 2 (two) times daily.  Marland Kitchen aspirin 81 MG chewable tablet Chew 81 mg by mouth daily.  Marland Kitchen BYSTOLIC 10 MG tablet TAKE 1 TABLET (10 MG TOTAL) BY MOUTH DAILY.  Marland Kitchen CAPSAICIN EX Apply topically as directed. To feet   . cholecalciferol (VITAMIN D) 1000 units tablet Take 1,000 Units by mouth daily.  . clotrimazole (LOTRIMIN) 1 % cream Apply to skin of hands, wrist, and feet three times a day. Continue for one month.  . COLACE 100 MG capsule Take 100 mg by mouth daily.  . diclofenac sodium (VOLTAREN) 1 % GEL Apply 2 g topically 4 (four) times daily. As needed for pain  . gabapentin (NEURONTIN) 300 MG capsule Take 1 capsule (300 mg total) by mouth 3 (three) times daily. (Patient taking differently: Take 300 mg by mouth daily. )  . HYDROcodone-homatropine (HYCODAN) 5-1.5 MG/5ML syrup Take 5 mLs by mouth every 6 (six) hours as needed for cough.  . lidocaine-prilocaine (EMLA) cream Apply topically as needed. Apply to porta cath site one hour prior to needle stick.  . nitroGLYCERIN (NITROSTAT) 0.4 MG SL tablet Place 1 tablet (0.4 mg total) under the tongue every 5 (five) minutes as needed. For chest pain  . omeprazole (PRILOSEC) 40 MG capsule Take 40 mg  by mouth daily.  . potassium chloride SA (K-DUR,KLOR-CON) 20 MEQ tablet Take 20 mEq by mouth 2 (two) times daily.  . pravastatin (PRAVACHOL) 40 MG tablet Take 40 mg by mouth daily.  Marland Kitchen tiZANidine (ZANAFLEX) 2 MG tablet Take 1 tablet (2 mg total) by mouth every 6 (six) hours as needed (Back Muscle Spasm).  . traMADol (ULTRAM) 50 MG tablet Take 1 tablet (50 mg total) by mouth every 6 (six) hours as needed for moderate pain or severe pain.  Marland Kitchen umeclidinium-vilanterol (ANORO ELLIPTA)  62.5-25 MCG/INH AEPB Inhale 1 puff into the lungs daily.   Allergies  Allergen Reactions  . Adhesive [Tape] Rash  . Lisinopril Cough   Past Medical History:  Diagnosis Date  . Acute on chronic respiratory failure with hypoxemia ([place]) 07/11/2015  . 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 Arthritis   . Back pain   . Benign paroxysmal positional vertigo 08/16/2015  . Bilateral leg edema 10/17/2014  . Bilateral pleural effusion 08/09/2013  . Blurred vision, bilateral 06/06/2014  . Brain metastases ([place]) 02/15/12  . Cerebral aneurysm   . Chronic folliculitis    of groin  . Colon cancer ([place]) 11/08  . COPD (chronic obstructive pulmonary disease) ([place])   . Coronary artery disease   . Depression   . Depressive disorder   . Encounter for antineoplastic chemotherapy 09/11/2015  . Encounter for therapeutic drug monitoring 09/11/2015  . Falls frequently 08/02/2015  . Family history of Huntington's disease   . Family history of trichomonal vaginitis 05/2005  . Fatigue 02/06/2013  . 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 Fibrocystic breast changes   . GERD (gastroesophageal reflux disease)   . Hypercholesterolemia   . Hyperlipidemia   . Hypertension   . Hypokalemia 08/09/2013  . Lung cancer ([place]) 06/16/10   PET scan 04/28/2010; primary: increase in size 02/2010 / Well Differentiated Adenocarcinoma of the lung   . Lung nodule    FNA ordered for 04/02/10 by HA>pos Ca  . On antineoplastic chemotherapy started 02/2012   [place]  . Other fatigue 11/28/2014  . Pleural effusion 05/03/2013  . 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 Postmenopausal   . POSTMENOPAUSAL SYNDROME 01/31/2009   Qualifier: Diagnosis of  By: Carlena Sax  MD, Colletta [place]    . S/P radiation therapy 03/01/12   SRS: 1 fraction / 20 Gray each to the Left Occipital Region and to the Right Insular Metastases  . Shortness of breath   . Status post chemotherapy comp. 12/29/11   Carboplatin/Taxol  . Status post  radiation therapy 11/16/11 - 12/29/11   Right Lung and Mediastinum: 60 Gy  . Status post stereotactic radiosurgery 01/2012   for Brain Metastases  . Tobacco dependence   . Vitamin D deficiency 08/30/2015  . Weight loss 10/22/2011   Family History  Problem Relation Age of Onset  . Stomach cancer Maternal Aunt   . Osteoarthritis Father   . Gout Father   . Hypertension Father   . Heart disease Mother     pericarditis;   . Breast cancer Cousin   . Cancer Sister     Lymphatic  . Huntington's disease Son   . Huntington's disease Son   . Anesthesia problems Neg Hx    Past Surgical History:  Procedure Laterality Date  . BACK SURGERY     Dr Luiz Ochoa  . BREAST SURGERY     Bil lumpectomy  . cardiac cath x3    . CHEST TUBE INSERTION Right 06/12/2013  Procedure: INSERTION PLEURAL DRAINAGE CATHETER;  Surgeon: Gaye Pollack, MD;  Location: [facility];  Service: Thoracic;  Laterality: Right;  . COLECTOMY  03/22/07   Stage 1 pT2 N0, M0 Adenocarcinoma of the sigmoid  colon  . HERNIA REPAIR    . LUNG LOBECTOMY  06/16/10   Left Upper Lobectomy  . MEDIASTINOSCOPY  10/19/2011   Procedure: MEDIASTINOSCOPY;  Surgeon: Gaye Pollack, MD;  Location: [facility];  Service: Thoracic;  Laterality: N/A;  . TALC PLEURODESIS Right 06/12/2013   Procedure: Pietro Cassis;  Surgeon: Gaye Pollack, MD;  Location: [facility];  Service: Thoracic;  Laterality: Right;  . TUBAL LIGATION    . TUNNELED VENOUS CATHETER PLACEMENT     Port-a-Cath   Social History   Social History  . Marital status: Widowed    Spouse name: N/A  . Number of children: 3  . Years of education: 12   Occupational History  . retired-graphic Engineer, civil (consulting) Unemployed   Social History Main Topics  . Smoking status: Current Every Day Smoker    Packs/day: 0.75    Types: Cigarettes    Start date: 05/18/1965  . Smokeless tobacco: Never Used  . Alcohol use 0.0 oz/week     Comment: occasional  . Drug use: No  . Sexual activity: No   Other Topics Concern   . Not on file   Social History Narrative   Health Care POA:    Emergency Contact: daughter, Tye [place]: filed in chart   Who lives with you: self   Any pets: none   Diet: patient has a varied diet.   Exercise: pt does not have a regular exercise routine.   Seatbelts: Pt reports wearing seatbelt when in vehicles.    Hobbies: listening to music   Social:Widowed for more than 10 years and living in [place]. Patient has 3 adult children: 1 daughter [name]) and two sons; (one living in [facility] with Huntington's Disease and another son in [place], [place] with possible Huntington's Disease.   Patient is independent with ADLs. Walks with a cane as needed only.   Transportation: owns a car and still driving.    Caregiver and Emergency contact: Daughter, [name], [phone]    DME: [name], [employer]   Advance Directive: Yes, LW and HCPOA. Patient wishes to be cremated     Review of Systems: General: negative for chills, fever, night sweats or weight changes.  Cardiovascular: negative for chest pain, dyspnea on exertion, edema, orthopnea, palpitations, paroxysmal nocturnal dyspnea or shortness of breath Dermatological: negative for rash Respiratory: negative for cough or wheezing Urologic: negative for hematuria Abdominal: negative for nausea, vomiting, diarrhea, bright red blood per rectum, melena, or hematemesis Neurologic: negative for visual changes, syncope, or dizziness All other systems reviewed and are otherwise negative except as noted above.   Physical Exam:  Blood pressure (!) 146/80, pulse 86, height '5\' 4"'  (1.626 m), weight 170 lb (77.1 kg), SpO2 97 %.  General appearance: alert, cooperative and no distress Neck: no carotid bruit, no JVD and supple, symmetrical, trachea midline Lungs: clear to auscultation bilaterally Heart: regular rate and rhythm, S1, S2 normal, no murmur, click, rub or  gallop Extremities: extremities normal, atraumatic, no cyanosis or edema Pulses: 2+ and symmetric Skin: Skin color, texture, turgor normal. No rashes or lesions Neurologic: Grossly normal  EKG NSR. No ischemia.   ASSESSMENT AND PLAN:   1. Chest Pain with Moderate Risk for  Cardiac Etiology: Pt had a recent episode of substernal chest pressure/tightness relieved with nitroglycerin. She does have multiple risk factors including 50 year history of tobacco use, hypertension and hyperlipidemia. She does have a history of lung cancer, which is under surveillance, however she denies any history of radiation to the chest. In addition to recent chest discomfort, she does report issues with exertional dyspnea. She is unable to walk up a flight of stairs without having to stop. Her EKG today demonstrates normal sinus rhythm without any objective signs of ischemia. However given her risk factors as well as her recent symptomatology, would recommend nuclear stress testing to risk stratify. Given that she is undergoing immunotherapy with oncology, we will check with her primary oncologist to see if she would be able to undergo a chemical stress testing. F/u after stress test.   Lyda Jester PA-C 03/11/2016 3:38 PM

## 2016-03-23 DIAGNOSIS — H903 Sensorineural hearing loss, bilateral: Secondary | ICD-10-CM | POA: Diagnosis not present

## 2016-03-23 DIAGNOSIS — H9192 Unspecified hearing loss, left ear: Secondary | ICD-10-CM | POA: Diagnosis not present

## 2016-03-24 ENCOUNTER — Telehealth: Payer: Self-pay | Admitting: Hematology and Oncology

## 2016-03-24 NOTE — Telephone Encounter (Signed)
Spoke with patient re next appointment 11/9. Patient to get new schedule 11/9.

## 2016-03-25 ENCOUNTER — Telehealth: Payer: Self-pay | Admitting: *Deleted

## 2016-03-25 DIAGNOSIS — R079 Chest pain, unspecified: Secondary | ICD-10-CM

## 2016-03-25 NOTE — Telephone Encounter (Signed)
Called pt per Ellen Henri, PA-C, to let her know that her Oncologist was ok with her getting a Lexiscan done for her recent CP she had while in his office.  Pt was given instructions verbally over the phone, per pt request, and she verbalized understanding.

## 2016-03-26 ENCOUNTER — Other Ambulatory Visit (HOSPITAL_BASED_OUTPATIENT_CLINIC_OR_DEPARTMENT_OTHER): Payer: Commercial Managed Care - HMO

## 2016-03-26 ENCOUNTER — Other Ambulatory Visit: Payer: Self-pay | Admitting: Cardiology

## 2016-03-26 ENCOUNTER — Ambulatory Visit (HOSPITAL_BASED_OUTPATIENT_CLINIC_OR_DEPARTMENT_OTHER): Payer: Commercial Managed Care - HMO

## 2016-03-26 VITALS — BP 135/70 | HR 76 | Temp 98.1°F | Resp 20

## 2016-03-26 DIAGNOSIS — C7931 Secondary malignant neoplasm of brain: Secondary | ICD-10-CM | POA: Diagnosis not present

## 2016-03-26 DIAGNOSIS — Z5181 Encounter for therapeutic drug level monitoring: Secondary | ICD-10-CM

## 2016-03-26 DIAGNOSIS — Z5112 Encounter for antineoplastic immunotherapy: Secondary | ICD-10-CM

## 2016-03-26 DIAGNOSIS — C3412 Malignant neoplasm of upper lobe, left bronchus or lung: Secondary | ICD-10-CM | POA: Diagnosis not present

## 2016-03-26 DIAGNOSIS — Z79899 Other long term (current) drug therapy: Secondary | ICD-10-CM

## 2016-03-26 DIAGNOSIS — C7949 Secondary malignant neoplasm of other parts of nervous system: Principal | ICD-10-CM

## 2016-03-26 DIAGNOSIS — R079 Chest pain, unspecified: Secondary | ICD-10-CM

## 2016-03-26 DIAGNOSIS — Z5111 Encounter for antineoplastic chemotherapy: Secondary | ICD-10-CM

## 2016-03-26 LAB — CBC WITH DIFFERENTIAL/PLATELET
BASO%: 0.1 % (ref 0.0–2.0)
BASOS ABS: 0 10*3/uL (ref 0.0–0.1)
EOS ABS: 0.1 10*3/uL (ref 0.0–0.5)
EOS%: 1.2 % (ref 0.0–7.0)
HCT: 39.3 % (ref 34.8–46.6)
HGB: 12.3 g/dL (ref 11.6–15.9)
LYMPH%: 16.6 % (ref 14.0–49.7)
MCH: 23.7 pg — ABNORMAL LOW (ref 25.1–34.0)
MCHC: 31.3 g/dL — ABNORMAL LOW (ref 31.5–36.0)
MCV: 75.7 fL — AB (ref 79.5–101.0)
MONO#: 0.6 10*3/uL (ref 0.1–0.9)
MONO%: 8.2 % (ref 0.0–14.0)
NEUT%: 73.9 % (ref 38.4–76.8)
NEUTROS ABS: 4.9 10*3/uL (ref 1.5–6.5)
PLATELETS: 183 10*3/uL (ref 145–400)
RBC: 5.19 10*6/uL (ref 3.70–5.45)
RDW: 17.2 % — ABNORMAL HIGH (ref 11.2–14.5)
WBC: 6.7 10*3/uL (ref 3.9–10.3)
lymph#: 1.1 10*3/uL (ref 0.9–3.3)

## 2016-03-26 LAB — COMPREHENSIVE METABOLIC PANEL
ALK PHOS: 147 U/L (ref 40–150)
ALT: <6 U/L (ref 0–55)
ANION GAP: 8 meq/L (ref 3–11)
AST: 10 U/L (ref 5–34)
Albumin: 2.7 g/dL — ABNORMAL LOW (ref 3.5–5.0)
BILIRUBIN TOTAL: 0.46 mg/dL (ref 0.20–1.20)
BUN: 17.2 mg/dL (ref 7.0–26.0)
CO2: 23 meq/L (ref 22–29)
Calcium: 8.9 mg/dL (ref 8.4–10.4)
Chloride: 109 mEq/L (ref 98–109)
Creatinine: 1.1 mg/dL (ref 0.6–1.1)
EGFR: 57 mL/min/{1.73_m2} — AB (ref >90)
Glucose: 88 mg/dl (ref 70–140)
Potassium: 4.1 mEq/L (ref 3.5–5.1)
Sodium: 141 mEq/L (ref 136–145)
TOTAL PROTEIN: 7 g/dL (ref 6.4–8.3)

## 2016-03-26 LAB — TSH: TSH: 1.226 m(IU)/L (ref 0.308–3.960)

## 2016-03-26 MED ORDER — HEPARIN SOD (PORK) LOCK FLUSH 100 UNIT/ML IV SOLN
500.0000 [IU] | Freq: Once | INTRAVENOUS | Status: AC | PRN
  Administered 2016-03-26: 500 [IU]
  Filled 2016-03-26: qty 5

## 2016-03-26 MED ORDER — SODIUM CHLORIDE 0.9 % IV SOLN
Freq: Once | INTRAVENOUS | Status: AC
Start: 2016-03-26 — End: 2016-03-26
  Administered 2016-03-26: 13:00:00 via INTRAVENOUS

## 2016-03-26 MED ORDER — SODIUM CHLORIDE 0.9 % IV SOLN
240.0000 mg | Freq: Once | INTRAVENOUS | Status: AC
  Administered 2016-03-26: 240 mg via INTRAVENOUS
  Filled 2016-03-26: qty 20

## 2016-03-26 MED ORDER — SODIUM CHLORIDE 0.9% FLUSH
10.0000 mL | INTRAVENOUS | Status: DC | PRN
  Administered 2016-03-26: 10 mL
  Filled 2016-03-26: qty 10

## 2016-03-26 NOTE — Progress Notes (Signed)
Order placed for Lexiscan NST.

## 2016-03-26 NOTE — Patient Instructions (Signed)
[facility] Discharge Instructions for Patients Receiving Chemotherapy  Today you received the following chemotherapy agents:  Opdivo  To help prevent nausea and vomiting after your treatment, we encourage you to take your nausea medication as prescribed.   If you develop nausea and vomiting that is not controlled by your nausea medication, call the clinic.   BELOW ARE SYMPTOMS THAT SHOULD BE REPORTED IMMEDIATELY:  *FEVER GREATER THAN 100.5 F  *CHILLS WITH OR WITHOUT FEVER  NAUSEA AND VOMITING THAT IS NOT CONTROLLED WITH YOUR NAUSEA MEDICATION  *UNUSUAL SHORTNESS OF BREATH  *UNUSUAL BRUISING OR BLEEDING  TENDERNESS IN MOUTH AND THROAT WITH OR WITHOUT PRESENCE OF ULCERS  *URINARY PROBLEMS  *BOWEL PROBLEMS  UNUSUAL RASH Items with * indicate a potential emergency and should be followed up as soon as possible.  Feel free to call the clinic you have any questions or concerns. The clinic phone number is [phone].  Please show the CHEMO ALERT CARD at check-in to the Emergency Department and triage nurse.   

## 2016-03-30 ENCOUNTER — Encounter: Payer: Self-pay | Admitting: Family Medicine

## 2016-04-02 ENCOUNTER — Telehealth (HOSPITAL_COMMUNITY): Payer: Self-pay | Admitting: *Deleted

## 2016-04-02 NOTE — Telephone Encounter (Signed)
Left message on voicemail in reference to upcoming appointment scheduled for 04/07/16. Phone number given for a call back so details instructions can be given. [name]

## 2016-04-03 ENCOUNTER — Telehealth (HOSPITAL_COMMUNITY): Payer: Self-pay

## 2016-04-03 NOTE — Telephone Encounter (Signed)
Patient given detailed instructions per Myocardial Perfusion Study Information Sheet for the test on 04/07/16 at 1000. Patient notified to arrive 15 minutes early and that it is imperative to arrive on time for appointment to keep from having the test rescheduled.  If you need to cancel or reschedule your appointment, please call the office within 24 hours of your appointment. Failure to do so may result in a cancellation of your appointment, and a $50 no show fee. Patient verbalized understanding. Janifer Adie, CNMT, RT-N

## 2016-04-06 ENCOUNTER — Other Ambulatory Visit: Payer: Self-pay | Admitting: *Deleted

## 2016-04-06 NOTE — Patient Outreach (Signed)
[facility]) Care Management  04/06/2016  Lauren [name] [birth year] [id number]  RN Health Coach attempted #1  Follow up outreach call to patient.  Patient was unavailable. HIPAA compliance voicemail message was left with return callback number.   Plan: RN will call patient again within 14 days.  [facility] Care Management [phone]

## 2016-04-07 ENCOUNTER — Ambulatory Visit (HOSPITAL_COMMUNITY): Payer: Commercial Managed Care - HMO | Attending: Cardiovascular Disease

## 2016-04-07 ENCOUNTER — Encounter: Payer: Self-pay | Admitting: *Deleted

## 2016-04-07 ENCOUNTER — Other Ambulatory Visit: Payer: Self-pay | Admitting: *Deleted

## 2016-04-07 DIAGNOSIS — R079 Chest pain, unspecified: Secondary | ICD-10-CM | POA: Diagnosis not present

## 2016-04-07 LAB — MYOCARDIAL PERFUSION IMAGING
CHL CUP RESTING HR STRESS: 67 {beats}/min
LVDIAVOL: 62 mL (ref 46–106)
LVSYSVOL: 27 mL
NUC STRESS TID: 0.81
Peak HR: 98 {beats}/min
RATE: 0.18
SDS: 0
SRS: 0
SSS: 0

## 2016-04-07 MED ORDER — REGADENOSON 0.4 MG/5ML IV SOLN
0.4000 mg | Freq: Once | INTRAVENOUS | Status: AC
  Administered 2016-04-07: 0.4 mg via INTRAVENOUS

## 2016-04-07 MED ORDER — TECHNETIUM TC 99M TETROFOSMIN IV KIT
32.7000 | PACK | Freq: Once | INTRAVENOUS | Status: AC | PRN
Start: 2016-04-07 — End: 2016-04-07
  Administered 2016-04-07: 32.7 via INTRAVENOUS
  Filled 2016-04-07: qty 33

## 2016-04-07 MED ORDER — TECHNETIUM TC 99M TETROFOSMIN IV KIT
10.9000 | PACK | Freq: Once | INTRAVENOUS | Status: AC | PRN
  Administered 2016-04-07: 10.9 via INTRAVENOUS
  Filled 2016-04-07: qty 11

## 2016-04-07 NOTE — Patient Outreach (Signed)
[facility]) Care Management  04/07/2016   Lauren [name] 11-14-1950 [id number]  Subjective: RN Health Coach received return telephone call from patient.  Hipaa compliance verified. Per patient she is doing good. Patient still has productive coud of clear phlegm. Patient is currently still smoking. Patient is in the green zone. No increase shortness of breath. Patient went for hearing test that showed some nerve damage. Patient is to go back on 04/28/2016 for possible hearing aids. Patient is having chemo 11/30/ Patient is having a CT scan of the lungs 11/29. Patient has received her flu and pneumonia shot for the year. Per patient her appetite is good. Patient has agreed to follow up outreach calls.   Objective:   Current Medications:  Current Outpatient Prescriptions  Medication Sig Dispense Refill  . albuterol (PROVENTIL HFA;VENTOLIN HFA) 108 (90 Base) MCG/ACT inhaler Inhale 1 puff into the lungs 2 (two) times daily. 6.7 g 6  . aspirin 81 MG chewable tablet Chew 81 mg by mouth daily.    Marland Kitchen BYSTOLIC 10 MG tablet TAKE 1 TABLET (10 MG TOTAL) BY MOUTH DAILY. 90 tablet 3  . CAPSAICIN EX Apply topically as directed. To feet     . cholecalciferol (VITAMIN D) 1000 units tablet Take 1,000 Units by mouth daily.    . clotrimazole (LOTRIMIN) 1 % cream Apply to skin of hands, wrist, and feet three times a day. Continue for one month. 113 g 0  . COLACE 100 MG capsule Take 100 mg by mouth daily.    . diclofenac sodium (VOLTAREN) 1 % GEL Apply 2 g topically 4 (four) times daily. As needed for pain 100 g 3  . gabapentin (NEURONTIN) 300 MG capsule Take 1 capsule (300 mg total) by mouth 3 (three) times daily. (Patient taking differently: Take 300 mg by mouth daily. ) 90 capsule 1  . HYDROcodone-homatropine (HYCODAN) 5-1.5 MG/5ML syrup Take 5 mLs by mouth every 6 (six) hours as needed for cough. 240 mL 0  . lidocaine-prilocaine (EMLA) cream Apply topically as needed. Apply to porta cath  site one hour prior to needle stick. 30 g 6  . nitroGLYCERIN (NITROSTAT) 0.4 MG SL tablet Place 1 tablet (0.4 mg total) under the tongue every 5 (five) minutes as needed. For chest pain 25 tablet 5  . omeprazole (PRILOSEC) 40 MG capsule Take 40 mg by mouth daily.    . potassium chloride SA (K-DUR,KLOR-CON) 20 MEQ tablet Take 20 mEq by mouth 2 (two) times daily.    . pravastatin (PRAVACHOL) 40 MG tablet Take 40 mg by mouth daily.    Marland Kitchen tiZANidine (ZANAFLEX) 2 MG tablet Take 1 tablet (2 mg total) by mouth every 6 (six) hours as needed (Back Muscle Spasm). 30 tablet 3  . traMADol (ULTRAM) 50 MG tablet Take 1 tablet (50 mg total) by mouth every 6 (six) hours as needed for moderate pain or severe pain. 90 tablet 1  . umeclidinium-vilanterol (ANORO ELLIPTA) 62.5-25 MCG/INH AEPB Inhale 1 puff into the lungs daily. 1 each 0   No current facility-administered medications for this visit.    Facility-Administered Medications Ordered in Other Visits  Medication Dose Route Frequency Provider Last Rate Last Dose  . sodium chloride 0.9 % injection 10 mL  10 mL Intravenous PRN Heath Lark, MD   10 mL at 09/18/15 1345    Functional Status:  In your present state of health, do you have any difficulty performing the following activities: 04/07/2016 03/09/2016  Hearing? [name]  Vision?  Y Y  Difficulty concentrating or making decisions? [name]  Walking or climbing stairs? Y Y  Dressing or bathing? N N  Doing errands, shopping? Y Y  Conservation officer, nature and eating ? - N  Using the Toilet? - N  In the past six months, have you accidently leaked urine? - Y  Do you have problems with loss of bowel control? - N  Managing your Medications? - N  Managing your Finances? - N  Housekeeping or managing your Housekeeping? - Y  Some recent data might be hidden    Fall/Depression Screening: PHQ 2/9 Scores 04/07/2016 03/09/2016 02/27/2016 02/10/2016 01/31/2016 01/23/2016 12/23/2015  PHQ - 2 Score 0 0 0 0 0 1 0  PHQ- 9 Score - - - - -  - -  Exception Documentation - - - - - - -   THN CM Care Plan Problem One   Flowsheet Row Most Recent Value  Care Plan Problem One  knowledge deficit in self management of COPD  Role Documenting the Problem One  Health Coach  THN Long Term Goal (31-90 days)  Patient will not have any readmissions for COPD within the next 90 days  Interventions for Problem One Long Term Goal  RN reminded patient to keep appointments with PCP and oncology and pulmonary specialist. RN reminded patient to take medications as per order.  THN CM Short Term Goal #5 (0-30 days)  Patient will verbalize witin the next 30 days that she is getting in into a routine exercise program  Interventions for Short Term Goal #5  RN sent patient a list of chair exercises with pictures. RN discussed with patieht the importance of building up her strength.       Assessment: Patient received food gift card  Patient in the green zone Patient is adhering to medications prescribed Patient is currently still smoking Patient had hearing test CT scan of lungs scheduled for 04/16/2016 Patient will continue to benefit from [facility] telephonic outreach for education and support for COPD self management. Plan: RN sent educational material know when to go RN sent additional nutritional coupons of ensure Rn will follow up with patient on CT results of lungs RN will follow up outreach within the month of December  Corby [place] [facility] Management [phone]

## 2016-04-08 ENCOUNTER — Telehealth: Payer: Self-pay

## 2016-04-08 NOTE — Telephone Encounter (Signed)
Patient wants to know whether or not she needs to keep her appt scheduled with Lyda Jester, PA on 11/27. Pt myoview results were ok and pt has not had any reoccurrence of symptoms.  Adv pt that I will ask Brittainy if the appt is still needed and call back with her response.  Pt aware of Brittainy's response its  to cancel the appt, pt is call back if symptoms develop. Pt aware of plan and verbalized understanding.

## 2016-04-13 ENCOUNTER — Ambulatory Visit: Payer: Self-pay | Admitting: Cardiology

## 2016-04-15 ENCOUNTER — Ambulatory Visit (HOSPITAL_COMMUNITY)
Admission: RE | Admit: 2016-04-15 | Discharge: 2016-04-15 | Disposition: A | Discharge status: Home / Self Care | Payer: Commercial Managed Care - HMO | Source: Ambulatory Visit | Attending: Hematology and Oncology | Admitting: Hematology and Oncology

## 2016-04-15 DIAGNOSIS — R918 Other nonspecific abnormal finding of lung field: Secondary | ICD-10-CM | POA: Insufficient documentation

## 2016-04-15 DIAGNOSIS — C3412 Malignant neoplasm of upper lobe, left bronchus or lung: Secondary | ICD-10-CM | POA: Diagnosis not present

## 2016-04-15 DIAGNOSIS — C189 Malignant neoplasm of colon, unspecified: Secondary | ICD-10-CM | POA: Diagnosis not present

## 2016-04-15 DIAGNOSIS — C7800 Secondary malignant neoplasm of unspecified lung: Secondary | ICD-10-CM | POA: Diagnosis not present

## 2016-04-15 MED ORDER — IOPAMIDOL (ISOVUE-300) INJECTION 61%
75.0000 mL | Freq: Once | INTRAVENOUS | Status: AC | PRN
  Administered 2016-04-15: 75 mL via INTRAVENOUS

## 2016-04-16 ENCOUNTER — Ambulatory Visit: Payer: Self-pay | Admitting: Family Medicine

## 2016-04-16 ENCOUNTER — Telehealth: Payer: Self-pay | Admitting: Hematology and Oncology

## 2016-04-16 ENCOUNTER — Encounter: Payer: Self-pay | Admitting: Hematology and Oncology

## 2016-04-16 ENCOUNTER — Ambulatory Visit (HOSPITAL_BASED_OUTPATIENT_CLINIC_OR_DEPARTMENT_OTHER): Payer: Commercial Managed Care - HMO | Admitting: Hematology and Oncology

## 2016-04-16 ENCOUNTER — Ambulatory Visit: Payer: Self-pay

## 2016-04-16 ENCOUNTER — Telehealth: Payer: Self-pay | Admitting: *Deleted

## 2016-04-16 ENCOUNTER — Other Ambulatory Visit: Payer: Self-pay

## 2016-04-16 VITALS — BP 145/58 | HR 79 | Temp 98.2°F | Resp 18 | Ht 64.0 in | Wt 166.3 lb

## 2016-04-16 DIAGNOSIS — C3412 Malignant neoplasm of upper lobe, left bronchus or lung: Secondary | ICD-10-CM | POA: Diagnosis not present

## 2016-04-16 DIAGNOSIS — C7931 Secondary malignant neoplasm of brain: Secondary | ICD-10-CM | POA: Diagnosis not present

## 2016-04-16 DIAGNOSIS — L271 Localized skin eruption due to drugs and medicaments taken internally: Secondary | ICD-10-CM | POA: Diagnosis not present

## 2016-04-16 DIAGNOSIS — G620 Drug-induced polyneuropathy: Secondary | ICD-10-CM | POA: Diagnosis not present

## 2016-04-16 DIAGNOSIS — Z7189 Other specified counseling: Secondary | ICD-10-CM | POA: Insufficient documentation

## 2016-04-16 DIAGNOSIS — C7949 Secondary malignant neoplasm of other parts of nervous system: Secondary | ICD-10-CM

## 2016-04-16 DIAGNOSIS — F17200 Nicotine dependence, unspecified, uncomplicated: Secondary | ICD-10-CM

## 2016-04-16 DIAGNOSIS — Z720 Tobacco use: Secondary | ICD-10-CM

## 2016-04-16 DIAGNOSIS — T451X5A Adverse effect of antineoplastic and immunosuppressive drugs, initial encounter: Secondary | ICD-10-CM

## 2016-04-16 NOTE — Assessment & Plan Note (Signed)
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 She is prescribed gabapentin. We will monitor carefully

## 2016-04-16 NOTE — Telephone Encounter (Signed)
Per LOS I have scheduled appts and notified scheduelr

## 2016-04-16 NOTE — Progress Notes (Signed)
Non-Small Cell Lung - No Medical Intervention - Off Treatment.  Patient Characteristics: Stage IV Metastatic, Non Squamous, Third Line - Chemotherapy/Immunotherapy, PS = 0, 1, Prior PD-1/PD-L1 Inhibitor or No Prior PD-1/PD-L1 Inhibitor and Not a Candidate for Immunotherapy Check here if patient was staged using an edition prior to AJCC Staging - 8th Edition (i.e., prior to May 18, 2016)? false AJCC T Category: T4 Current Disease Status: Distant Metastases AJCC N Category: N1 AJCC M Category: M1c AJCC 8 Stage Grouping: IVB Histology: Non Squamous Cell ROS1 Rearrangement Status: Negative T790M Mutation Status: Not Applicable - EGFR Mutation Negative/Unknown Other Mutations/Biomarkers: No Other Actionable Mutations PD-L1 Expression Status: PD-L1 Negative Chemotherapy/Immunotherapy LOT: Third Line Chemotherapy/Immunotherapy Molecular Targeted Therapy: Not Appropriate ALK Translocation Status: Negative Would you be surprised if this patient died  in the next year? I would be surprised if this patient died in the next year EGFR Mutation Status: Negative/Wild Type BRAF V600E Mutation Status: Negative Performance Status: PS = 0, 1 Immunotherapy Candidate Status: Not a Candidate for Immunotherapy Prior Immunotherapy Status: Prior PD-1/PD-L1 Inhibitor

## 2016-04-16 NOTE — Assessment & Plan Note (Signed)
The patient is aware she has stage IV disease and treatment is strictly palliative. We discussed importance of Advanced Directives and Living will. The patient has advanced directive. Her daughter is the dedicated medical power of attorney We discussed CODE STATUS; the patient desires to be DO NOT RESUSCITATE She shared with me that 2 of her sons have Huntington's disease and they are currently being cared for in group homes. Her desire is to continue chemotherapy to prolong life but if she becomes debilitated, she will stop treatment She does not want long-term artificial feeding tube or hemodialysis if she developed multiple organ failure

## 2016-04-16 NOTE — Telephone Encounter (Signed)
Appointments scheduled per 11/30 LOS. Patient given AVS report and calendars with future scheduled appointments. °

## 2016-04-16 NOTE — Progress Notes (Signed)
[facility] OFFICE PROGRESS NOTE  Patient Care Team: [name], MD as PCP - General (Family Medicine) Gaye Pollack, MD (Cardiothoracic Surgery) Dickie La, MD (Family Medicine) Heath Lark, MD as Consulting Physician (Hematology and Oncology) Verlin Grills, RN as [facility] Leighton Ruff, OD (Optometry) Milus Banister, MD as Consulting Physician (Gastroenterology)  SUMMARY OF ONCOLOGIC HISTORY: Oncology History   Colon cancer   Primary site: Colon and Rectum (Left)   Staging method: AJCC 7th Edition   Clinical: Stage I (T2, N0, M0) signed by Heath Lark, MD on 05/31/2013  2:39 PM   Pathologic: Stage I (T2, N0, cM0) signed by Heath Lark, MD on 05/31/2013  2:39 PM   Summary: Stage I (T2, N0, cM0) Lung cancer, EGFR/ALK negative, recurrence after initial resection to LN and brain   Primary site: Lung (Left)   Staging method: AJCC 7th Edition   Clinical: Stage IV (T1, N2, M1b) signed by Heath Lark, MD on 05/31/2013  2:26 PM   Pathologic: Stage IV (T1, N2, M1b) signed by Heath Lark, MD on 05/31/2013  2:26 PM   Summary: Stage IV (T1, N2, M1b)       Cancer of upper lobe of left lung, Adenocarcinoma   03/01/2007 Procedure    Colonoscopy revealed abnormalities and biopsy show high-grade dysplasia      04/01/2007 Surgery    She underwent sigmoid resection which showed T2 N0 colon cancer, and negative margins and all of 17 lymph nodes were negative      02/29/2008 Procedure    Repeat surveillance colonoscopy was negative.      06/16/2010 Surgery    She underwent left upper lobectomy we show well-differentiated adenocarcinoma of the lung, T1, N0, M0      03/18/2011 Procedure    Repeat colonoscopy show multiple polyps but there were benign      10/19/2011 Procedure    Biopsy of mediastinal lymph node came back positive for recurrence of lung cancer, EGFR and ALK negative      11/16/2011 - 12/14/2011 Chemotherapy    She  received concurrent chemoradiation therapy with weekly carboplatin and Taxol.      11/16/2011 - 12/29/2011 Radiation Therapy    She received radiation therapy with weekly chemotherapy      02/15/2012 Imaging    MR of the brain showed a new intracranial metastases. This was subsequently treated with stereotactic radiosurgery.      03/07/2012 - 04/12/2013 Chemotherapy    She received chemotherapy with maintainence Alimta every 3 weeks. Chemotherapy was discontinued due to profound fatigue      03/02/2013 Procedure    She had therapeutic ultrasound guidance thoracentesis for pleural effusion that came back negative for cancer      05/04/2013 Procedure    She had repeat ultrasound-guided thoracentesis again and cytology was negative      05/29/2013 Imaging    Repeat CT scan of the chest, abdomen and pelvis show no evidence of disease but persistent right-sided pleural effusion      06/02/2013 Surgery    The patient had placement of Pleurx catheter and subsequently underwent pleurodesis.      09/22/2013 Imaging    Repeat CT scan show no evidence of active disease. There are nonspecific lymphadenopathy and she is placed on observation.      03/23/2014 Imaging    Repeat CT scan of the chest, abdomen and pelvis show recurrence of cancer with widespread bilateral pulmonary metastasis.  05/14/2014 Imaging    Imaging of the chest and brain were repeated due to delay of initiation of chemotherapy. Overall, chest CT scan show stable disease. MRI of the head was negative for recurrence      05/16/2014 - 07/18/2014 Chemotherapy     she completed 4 cycles of combination chemotherapy with carboplatin and Alimta      07/16/2014 Imaging     repeat CT scan of the chest, abdomen and pelvis show regression in the size of lung nodules.      08/29/2014 - 08/14/2015 Chemotherapy    She received maintenance Alimta      10/16/2014 Imaging     repeat CT scan of the chest, abdomen and pelvis show  regression in the size of lung nodules.      01/29/2015 Imaging    Repeat CT scan showed stable disease      02/01/2015 Imaging    MRI brain is negative      04/30/2015 Imaging    CT scan of the chest, abdomen and pelvis showed stable disease      07/25/2015 Imaging    MRI brain showed no evidence of new disease      09/09/2015 Imaging    CT chest showed interval increase in size of large RIGHT upper lobe nodule is most consistent with lung cancer recurrence.      09/18/2015 - 03/26/2016 Chemotherapy    She started on Nivolumab      10/22/2015 Imaging    Screening mammogram showed mild abnormality      10/31/2015 Imaging    Diagnostic mammogram showed mild calcification at the right upper outer breast      11/27/2015 Imaging    Stable post treatment related changes of left lower lobectomy and radiation therapy redemonstrated, as above. No definite findings to suggest local recurrence of disease or metastatic disease on today's examination      01/24/2016 Imaging    MR brain showed continued stable appearance of two small treated brain metastases. No new or progressive metastatic disease to the brain.      04/15/2016 Imaging    Ct scan showed findings highly suspicious for recurrent tumor in the mediastinum surrounding the right mainstem bronchus and in the region of the azygoesophageal recess. Diffuse pulmonary metastatic disease with new and progressive nodules since the prior examination.       Brain metastases treated with radiosurgery   02/26/2012 Initial Diagnosis    Brain metastases treated with radiosurgery       INTERVAL HISTORY: Please see below for problem oriented charting. She is seen today to review test results. She continues to have chronic productive cough of white sputum. She continues to smoke. She denies hemoptysis. Denies recent headaches, neurological deficits of seizures Her appetite is stable. She denies recent weight loss The rash on her skin is  improving. She continues to have peripheral neuropathy, stable  REVIEW OF SYSTEMS:   Constitutional: Denies fevers, chills or abnormal weight loss Eyes: Denies blurriness of vision Ears, nose, mouth, throat, and face: Denies mucositis or sore throat Cardiovascular: Denies palpitation, chest discomfort or lower extremity swelling Gastrointestinal:  Denies nausea, heartburn or change in bowel habits Lymphatics: Denies new lymphadenopathy or easy bruising Neurological:Denies numbness, tingling or new weaknesses Behavioral/Psych: Mood is stable, no new changes  All other systems were reviewed with the patient and are negative.  I have reviewed the past medical history, past surgical history, social history and family history with the patient and they are  unchanged from previous note.  ALLERGIES:  is allergic to adhesive [tape] and lisinopril.  MEDICATIONS:  Current Outpatient Prescriptions  Medication Sig Dispense Refill  . albuterol (PROVENTIL HFA;VENTOLIN HFA) 108 (90 Base) MCG/ACT inhaler Inhale 1 puff into the lungs 2 (two) times daily. 6.7 g 6  . aspirin 81 MG chewable tablet Chew 81 mg by mouth daily.    Marland Kitchen BYSTOLIC 10 MG tablet TAKE 1 TABLET (10 MG TOTAL) BY MOUTH DAILY. 90 tablet 3  . CAPSAICIN EX Apply topically as directed. To feet     . cholecalciferol (VITAMIN D) 1000 units tablet Take 1,000 Units by mouth daily.    . clotrimazole (LOTRIMIN) 1 % cream Apply to skin of hands, wrist, and feet three times a day. Continue for one month. 113 g 0  . COLACE 100 MG capsule Take 100 mg by mouth daily.    . diclofenac sodium (VOLTAREN) 1 % GEL Apply 2 g topically 4 (four) times daily. As needed for pain 100 g 3  . gabapentin (NEURONTIN) 300 MG capsule Take 1 capsule (300 mg total) by mouth 3 (three) times daily. (Patient taking differently: Take 300 mg by mouth daily. ) 90 capsule 1  . HYDROcodone-homatropine (HYCODAN) 5-1.5 MG/5ML syrup Take 5 mLs by mouth every 6 (six) hours as needed for  cough. 240 mL 0  . lidocaine-prilocaine (EMLA) cream Apply topically as needed. Apply to porta cath site one hour prior to needle stick. 30 g 6  . nitroGLYCERIN (NITROSTAT) 0.4 MG SL tablet Place 1 tablet (0.4 mg total) under the tongue every 5 (five) minutes as needed. For chest pain 25 tablet 5  . omeprazole (PRILOSEC) 40 MG capsule Take 40 mg by mouth daily.    . potassium chloride SA (K-DUR,KLOR-CON) 20 MEQ tablet Take 20 mEq by mouth 2 (two) times daily.    . pravastatin (PRAVACHOL) 40 MG tablet Take 40 mg by mouth daily.    Marland Kitchen tiZANidine (ZANAFLEX) 2 MG tablet Take 1 tablet (2 mg total) by mouth every 6 (six) hours as needed (Back Muscle Spasm). 30 tablet 3  . traMADol (ULTRAM) 50 MG tablet Take 1 tablet (50 mg total) by mouth every 6 (six) hours as needed for moderate pain or severe pain. 90 tablet 1  . umeclidinium-vilanterol (ANORO ELLIPTA) 62.5-25 MCG/INH AEPB Inhale 1 puff into the lungs daily. 1 each 0   No current facility-administered medications for this visit.    Facility-Administered Medications Ordered in Other Visits  Medication Dose Route Frequency Provider Last Rate Last Dose  . sodium chloride 0.9 % injection 10 mL  10 mL Intravenous PRN Heath Lark, MD   10 mL at 09/18/15 1345    PHYSICAL EXAMINATION: ECOG PERFORMANCE STATUS: 1 - Symptomatic but completely ambulatory  Vitals:   04/16/16 1143  BP: (!) 145/58  Pulse: 79  Resp: 18  Temp: 98.2 F (36.8 C)   Filed Weights   04/16/16 1143  Weight: 166 lb 4.8 oz (75.4 kg)    GENERAL:alert, no distress and comfortable SKIN: skin color, texture, turgor are normal, no rashes or significant lesions EYES: normal, Conjunctiva are pink and non-injected, sclera clear OROPHARYNX:no exudate, no erythema and lips, buccal mucosa, and tongue normal  NECK: supple, thyroid normal size, non-tender, without nodularity LYMPH:  no palpable lymphadenopathy in the cervical, axillary or inguinal LUNGS: clear to auscultation and  percussion with normal breathing effort HEART: regular rate & rhythm and no murmurs and no lower extremity edema ABDOMEN:abdomen soft, non-tender and normal  bowel sounds Musculoskeletal:no cyanosis of digits and no clubbing  NEURO: alert & oriented x 3 with fluent speech, no focal motor/sensory deficits  LABORATORY DATA:  I have reviewed the data as listed    Component Value Date/Time   NA 141 03/26/2016 1222   K 4.1 03/26/2016 1222   CL 106 12/26/2013 0946   CL 102 10/24/2012 1001   CO2 23 03/26/2016 1222   GLUCOSE 88 03/26/2016 1222   GLUCOSE 111 (H) 10/24/2012 1001   BUN 17.2 03/26/2016 1222   CREATININE 1.1 03/26/2016 1222   CALCIUM 8.9 03/26/2016 1222   PROT 7.0 03/26/2016 1222   ALBUMIN 2.7 (L) 03/26/2016 1222   AST 10 03/26/2016 1222   ALT <6 03/26/2016 1222   ALKPHOS 147 03/26/2016 1222   BILITOT 0.46 03/26/2016 1222   GFRNONAA 74 (L) 06/12/2013 0625   GFRAA 86 (L) 06/12/2013 0625    No results found for: SPEP, UPEP  Lab Results  Component Value Date   WBC 6.7 03/26/2016   NEUTROABS 4.9 03/26/2016   HGB 12.3 03/26/2016   HCT 39.3 03/26/2016   MCV 75.7 (L) 03/26/2016   PLT 183 03/26/2016      Chemistry      Component Value Date/Time   NA 141 03/26/2016 1222   K 4.1 03/26/2016 1222   CL 106 12/26/2013 0946   CL 102 10/24/2012 1001   CO2 23 03/26/2016 1222   BUN 17.2 03/26/2016 1222   CREATININE 1.1 03/26/2016 1222      Component Value Date/Time   CALCIUM 8.9 03/26/2016 1222   ALKPHOS 147 03/26/2016 1222   AST 10 03/26/2016 1222   ALT <6 03/26/2016 1222   BILITOT 0.46 03/26/2016 1222       RADIOGRAPHIC STUDIES: I have personally reviewed the radiological images as listed and agreed with the findings in the report. Ct Chest W Contrast  Result Date: 04/16/2016 CLINICAL DATA:  Colon cancer with lung metastasis.  Restaging. EXAM: CT CHEST WITH CONTRAST TECHNIQUE: Multidetector CT imaging of the chest was performed during intravenous contrast  administration. CONTRAST:  84m ISOVUE-300 IOPAMIDOL (ISOVUE-300) INJECTION 61% COMPARISON:  11/26/2015 FINDINGS: Chest wall: Right-sided Port-A-Cath is stable. No breast masses, supraclavicular or axillary lymphadenopathy. Stable cystic lesion in the left thyroid lobe. Cardiovascular: Feet heart is normal in size and stable. No pericardial effusion. Stable advanced atherosclerotic calcifications, tortuosity and ectasia of the thoracic aorta. Stable three-vessel coronary artery calcifications. Mediastinum/Nodes: Overall Stable right parahilar and mediastinal soft tissue density likely a combination of treated tumor and radiation change. There is 1 area of concern. There is increasing soft tissue density in the upper aspect of the as ago esophageal recess on image number 64 which measures 19.5 x 16.5 mm. This previously measured 13 x 13 mm. There also appears to be slight increasing mass effect on the left atrium. Could not exclude recurrent tumor. PET-CT suggested for further evaluation. Lungs/Pleura: Numerous new pulmonary nodules and enlarging pulmonary nodules consistent with widespread pulmonary metastatic disease. Some of these nodules are cavitary. Upper Abdomen: Enlarging subdiaphragmatic lymph nodes adjacent to the liver. The largest node on image number 96 measures 10 mm and previously measured 7 mm. Stable low-attenuation right hepatic lobe lesion but no definite findings for new hepatic metastatic disease. Stable enlargement of the left adrenal gland. Musculoskeletal: No significant bony findings. IMPRESSION: 1. Findings highly suspicious for recurrent tumor in the mediastinum surrounding the right mainstem bronchus and in the region of the azygoesophageal recess. 2. Diffuse pulmonary metastatic disease with  new and progressive nodules since the prior examination. Electronically Signed   By: Marijo Sanes M.D.   On: 04/16/2016 08:43     ASSESSMENT & PLAN:  Cancer of upper lobe of left lung,  Adenocarcinoma Unfortunately, recent imaging studies show evidence of disease progression. We reviewed the current guidelines. The patient has persistent, residual peripheral neuropathy. I will try to prescribe treatment that would cause the least risk of neuropathy. I recommend combination therapy with carboplatin and gemcitabine. The goals of treatment is palliative.  Some of the short term side-effects included, though not limited to, including weight loss, life threatening infections, risk of allergic reactions, need for transfusions of blood products, nausea, vomiting, change in bowel habits, loss of hair, admission to hospital for various reasons, and risks of death.   Long term side-effects are also discussed including risks of infertility, permanent damage to nerve function, hearing loss, chronic fatigue, kidney damage with possibility needing hemodialysis, and rare secondary malignancy including bone marrow disorders.  The patient is aware that the response rates discussed earlier is not guaranteed.  After a long discussion, patient made an informed decision to proceed with the prescribed plan of care.  I will see her back prior to week 2 of treatment  Brain metastases treated with radiosurgery Her most recent MRI in September 2017 showed no evidence of disease recurrence. However, with recent signs of disease progression, I plan to repeat MRI scan next month before second week of treatment to exclude recurrence  TOBACCO ABUSE I spent some time counseling the patient the importance of tobacco cessation. She is currently not interested to quit now.  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 She is prescribed gabapentin. We will monitor carefully  Goals of care, counseling/discussion The patient is aware she has stage IV disease and treatment is strictly palliative. We discussed importance of Advanced Directives and  Living will. The patient has advanced directive. Her daughter is the dedicated medical power of attorney We discussed CODE STATUS; the patient desires to be DO NOT RESUSCITATE She shared with me that 2 of her sons have Huntington's disease and they are currently being cared for in group homes. Her desire is to continue chemotherapy to prolong life but if she becomes debilitated, she will stop treatment She does not want long-term artificial feeding tube or hemodialysis if she developed multiple organ failure   Orders Placed This Encounter  Procedures  . MR BRAIN W WO CONTRAST    Standing Status:   Future    Standing Expiration Date:   05/21/2017    Order Specific Question:   Reason for exam:    Answer:   Hx lung ca to brain, exclude recurrence    Order Specific Question:   Preferred imaging location?    Answer:   [facility] (table limit-350 lbs)    Order Specific Question:   Does the patient have a pacemaker or implanted devices?    Answer:   No  . CBC with Differential    Standing Status:   Standing    Number of Occurrences:   20    Standing Expiration Date:   04/17/2017  . Comprehensive metabolic panel    Standing Status:   Standing    Number of Occurrences:   20    Standing Expiration Date:   04/17/2017   All questions were answered. The patient knows to call the clinic with any problems, questions or concerns. No barriers to learning  was detected. I spent 40 minutes counseling the patient face to face. The total time spent in the appointment was 55 minutes and more than 50% was on counseling and review of test results     Heath Lark, MD 04/16/2016 1:25 PM

## 2016-04-16 NOTE — Assessment & Plan Note (Signed)
Unfortunately, recent imaging studies show evidence of disease progression. We reviewed the current guidelines. The patient has persistent, residual peripheral neuropathy. I will try to prescribe treatment that would cause the least risk of neuropathy. I recommend combination therapy with carboplatin and gemcitabine. The goals of treatment is palliative.  Some of the short term side-effects included, though not limited to, including weight loss, life threatening infections, risk of allergic reactions, need for transfusions of blood products, nausea, vomiting, change in bowel habits, loss of hair, admission to hospital for various reasons, and risks of death.   Long term side-effects are also discussed including risks of infertility, permanent damage to nerve function, hearing loss, chronic fatigue, kidney damage with possibility needing hemodialysis, and rare secondary malignancy including bone marrow disorders.  The patient is aware that the response rates discussed earlier is not guaranteed.  After a long discussion, patient made an informed decision to proceed with the prescribed plan of care.  I will see her back prior to week 2 of treatment

## 2016-04-16 NOTE — Assessment & Plan Note (Signed)
I spent some time counseling the patient the importance of tobacco cessation. She is currently not interested to quit now. 

## 2016-04-16 NOTE — Assessment & Plan Note (Signed)
Her most recent MRI in September 2017 showed no evidence of disease recurrence. However, with recent signs of disease progression, I plan to repeat MRI scan next month before second week of treatment to exclude recurrence

## 2016-04-20 ENCOUNTER — Ambulatory Visit: Payer: Self-pay | Admitting: *Deleted

## 2016-04-21 ENCOUNTER — Other Ambulatory Visit: Payer: Self-pay | Admitting: *Deleted

## 2016-04-21 DIAGNOSIS — J441 Chronic obstructive pulmonary disease with (acute) exacerbation: Secondary | ICD-10-CM

## 2016-04-21 NOTE — Patient Outreach (Signed)
[facility]) Care Management  04/21/2016  Lauren [name] [birth year] [id number]    RN Health Coach received  telephone call  from patient.  [employer] compliance verified. Per patient the results have come back From her lung scan and it shows the cancer has come back in both lungs. Patient is requesting help from the social worker to assist her in the placement of her son that is living with her and has huntington chorea. Plan: Referral to social worker [facility] [phone]

## 2016-04-23 ENCOUNTER — Ambulatory Visit (INDEPENDENT_AMBULATORY_CARE_PROVIDER_SITE_OTHER): Payer: Commercial Managed Care - HMO | Admitting: Family Medicine

## 2016-04-23 ENCOUNTER — Encounter: Payer: Self-pay | Admitting: *Deleted

## 2016-04-23 ENCOUNTER — Encounter: Payer: Self-pay | Admitting: Family Medicine

## 2016-04-23 ENCOUNTER — Telehealth: Payer: Self-pay | Admitting: Family Medicine

## 2016-04-23 DIAGNOSIS — R2689 Other abnormalities of gait and mobility: Secondary | ICD-10-CM

## 2016-04-23 DIAGNOSIS — M7542 Impingement syndrome of left shoulder: Secondary | ICD-10-CM | POA: Diagnosis not present

## 2016-04-23 DIAGNOSIS — M1711 Unilateral primary osteoarthritis, right knee: Secondary | ICD-10-CM | POA: Diagnosis not present

## 2016-04-23 DIAGNOSIS — H903 Sensorineural hearing loss, bilateral: Secondary | ICD-10-CM | POA: Diagnosis not present

## 2016-04-23 HISTORY — DX: Sensorineural hearing loss, bilateral: H90.3

## 2016-04-23 MED ORDER — ONDANSETRON 4 MG PO TBDP: 4.0000 mg | ORAL_TABLET | Freq: Three times a day (TID) | ORAL | 0 refills | Status: DC | PRN

## 2016-04-23 MED FILL — ONDANSETRON ODT 4 MG TABLET: 4 | 7 days supply | Qty: 20 | Fill #0

## 2016-04-23 NOTE — Assessment & Plan Note (Signed)
New diagnosis.  No further work up planned Left shoulder with limitations in flexion and abduction Pt declines steroid injection. Will use topical NSAID cream, ice and heat Recommended gentle range of motion exercises for shoulder Pt may return for subacromial injection if pain worsens

## 2016-04-23 NOTE — Patient Instructions (Signed)
Shoulder Bursitis Shoulder impingement syndrome is a condition that causes pain when connective tissues (tendons) surrounding the shoulder joint become pinched. These tendons are part of the group of muscles and tissues that help to stabilize the shoulder (rotator cuff). Beneath the rotator cuff is a fluid-filled sac (bursa) that allows the muscles and tendons to glide smoothly. The bursa may become swollen or irritated (bursitis). Bursitis, swelling in the rotator cuff tendons, or both conditions can decrease how much space is under a bone in the shoulder joint (acromion), resulting in impingement. What are the causes? Shoulder impingement syndrome can be caused by bursitis or swelling of the rotator cuff tendons, which may result from:  Repetitive overhead arm movements.  Falling onto the shoulder.  Weakness in the shoulder muscles. What increases the risk? You may be more likely to develop this condition if you are an athlete who participates in:  Sports that involve throwing, such as baseball.  Tennis.  Swimming.  Volleyball. Some people are also more likely to develop impingement syndrome because of the shape of their acromion bone. What are the signs or symptoms? The main symptom of this condition is pain on the front or side of the shoulder. Pain may:  Get worse when lifting or raising the arm.  Get worse at night.  Wake you up from sleeping.  Feel sharp when the shoulder is moved, and then fade to an ache. Other signs and symptoms may include:  Tenderness.  Stiffness.  Inability to raise the arm above shoulder level or behind the body.  Weakness. How is this diagnosed? This condition may be diagnosed based on:  Your symptoms.  Your medical history.  A physical exam.  Imaging tests, such as:  X-rays.  MRI.  Ultrasound. How is this treated? Treatment for this condition may include:  Resting your shoulder and avoiding all activities that cause pain or  put stress on the shoulder.  Icing your shoulder.  NSAIDs to help reduce pain and swelling.  One or more injections of medicines to numb the area and reduce inflammation.  Physical therapy.  Surgery. This may be needed if nonsurgical treatments have not helped. Surgery may involve repairing the rotator cuff, reshaping the acromion, or removing the bursa. Follow these instructions at home: Managing pain, stiffness, and swelling  If directed, apply ice to the injured area.  Put ice in a plastic bag.  Place a towel between your skin and the bag.  Leave the ice on for 20 minutes, 2-3 times a day. Activity  Rest and return to your normal activities as told by your health care provider. Ask your health care provider what activities are safe for you.  Do exercises as told by your health care provider. General instructions  Do not use any tobacco products, including cigarettes, chewing tobacco, or e-cigarettes. Tobacco can delay healing. If you need help quitting, ask your health care provider.  Ask your health care provider when it is safe for you to drive.  Take over-the-counter and prescription medicines only as told by your health care provider.  Keep all follow-up visits as told by your health care provider. This is important. How is this prevented?  Give your body time to rest between periods of activity.  Be safe and responsible while being active to avoid falls.  Maintain physical fitness, including strength and flexibility. Contact a health care provider if:  Your symptoms have not improved after 1-2 months of treatment and rest.  You cannot lift your arm  away from your body. This information is not intended to replace advice given to you by your health care provider. Make sure you discuss any questions you have with your health care provider. Document Released: 05/04/2005 Document Revised: 01/09/2016 Document Reviewed: 04/06/2015 Elsevier Interactive Patient  Education  2017 [employer].

## 2016-04-23 NOTE — Assessment & Plan Note (Signed)
Established problem worsened.  Rx for 3 prong cane to help with balance with walking and standing. Lauren [name] is medically necessary to help patient safely ambulate while accomplishing her ADLs and iADLS.

## 2016-04-23 NOTE — Telephone Encounter (Signed)
They need an updated [employer] referral at [facility]. Pt sees Dr. Alvy Bimler, appointment 12/8 for labs, diagnoses code c34.12. Please date referral for today. Thanks! ep

## 2016-04-23 NOTE — Assessment & Plan Note (Signed)
Established problem. Stable. Pt going for hearing aid consultation per Dr Redmond Baseman (ENT)

## 2016-04-23 NOTE — Progress Notes (Signed)
   Subjective:    Patient ID: SAE HANDRICH, female    DOB: Oct 02, 1950, 65 y.o.   MRN: [id number] Lauren [name] is alone Sources of clinical information for visit is/are patient, past medical records and Oncology recent note 11/30. Nursing assessment for this office visit was reviewed with the patient for accuracy and revision.   HPI  Recent imaging showed disease progression of lung cancer.  Pt starting carboplatin and gemcitabine palliative treatment 04/24/16  Lauren [name] brought a copy of her [facility] document to scan into the EMR. Lauren [name]. Rosana Hoes of [facility] [place] is the [employer] for [employer].  HCPOA document requests that her instructions in her [facility] document be followed.   Fall - onset in last couple years - Stable course - feels unbalanced withstanding. - uses a cane for balance but prong cap is waring out - Pt has peripheral neuropathy   Hearing Loss - Diagnosis in last moth by ENT, Dr Redmond Baseman - Pt referred for Hearing Aid consultation which her [place] covers, at least partially  Shoulder Pain Onset: several months Location: left anterior shoulder  Quality: aching Severity: moderate Function: impairs lift left arm above head Pattern: constant Course: progressively worsening Radiation: anterior shoulder to lengthof bicep Relief: Bengay rub Precipitant: none Associated Symptoms: no fever.  No problems with right shoulder or arm Trauma (Acute or Chronic): no Prior Diagnostic Testing or Treatments: No Xrays    SH: Smoking.  Not interest in quitting Review of Systems No fever No cough    Objective:   Physical Exam VS reviewed GEN: Alert, Cooperative, Groomed, NAD HEENT: PERRL; EAC bilaterally not occluded,  COR: RRR, No M/G/R, No JVD, Normal PMI size and location LUNGS: BCTA, No Acc mm use, speaking in full sentences Gait: Normal speed using cane in right hand, No significant path deviation, Step through +,    Psych: Normal affect/thought/speech/language  Left Shoulder: Inspection reveals no abnormalities, atrophy or asymmetry. Palpation is normal with no tenderness over AC joint or bicipital groove. AROM is limited  in all planes. PROM is limited in all planes Rotator cuff strength decreased to resistance throughout. (+) signs of impingement with negative Neer and Hawkin's tests.. No signs of bicipital tendonopathy with negative Speeds and Yergason's tests.  (+)painful arc and no drop arm sign.         Assessment & Plan:  30 minutes face to face were spent in total with supportive listening and counseling around her hopes and desires for the time she has remaining.  Counseling took more than 50% of the total time.

## 2016-04-23 NOTE — Telephone Encounter (Signed)
Completed, auth # [id number]

## 2016-04-24 ENCOUNTER — Ambulatory Visit: Payer: Commercial Managed Care - HMO

## 2016-04-24 ENCOUNTER — Ambulatory Visit (HOSPITAL_BASED_OUTPATIENT_CLINIC_OR_DEPARTMENT_OTHER): Payer: Commercial Managed Care - HMO

## 2016-04-24 ENCOUNTER — Other Ambulatory Visit (HOSPITAL_BASED_OUTPATIENT_CLINIC_OR_DEPARTMENT_OTHER): Payer: Commercial Managed Care - HMO

## 2016-04-24 VITALS — BP 140/67 | HR 73 | Temp 97.9°F | Resp 18

## 2016-04-24 DIAGNOSIS — C3412 Malignant neoplasm of upper lobe, left bronchus or lung: Secondary | ICD-10-CM

## 2016-04-24 DIAGNOSIS — Z5111 Encounter for antineoplastic chemotherapy: Secondary | ICD-10-CM

## 2016-04-24 DIAGNOSIS — C7931 Secondary malignant neoplasm of brain: Secondary | ICD-10-CM

## 2016-04-24 DIAGNOSIS — Z79899 Other long term (current) drug therapy: Secondary | ICD-10-CM

## 2016-04-24 DIAGNOSIS — C7949 Secondary malignant neoplasm of other parts of nervous system: Principal | ICD-10-CM

## 2016-04-24 DIAGNOSIS — Z5181 Encounter for therapeutic drug level monitoring: Secondary | ICD-10-CM

## 2016-04-24 LAB — CBC WITH DIFFERENTIAL/PLATELET
BASO%: 0.7 % (ref 0.0–2.0)
Basophils Absolute: 0 10*3/uL (ref 0.0–0.1)
EOS ABS: 0.1 10*3/uL (ref 0.0–0.5)
EOS%: 1.8 % (ref 0.0–7.0)
HEMATOCRIT: 40.2 % (ref 34.8–46.6)
HEMOGLOBIN: 12.4 g/dL (ref 11.6–15.9)
LYMPH%: 20.1 % (ref 14.0–49.7)
MCH: 23.2 pg — AB (ref 25.1–34.0)
MCHC: 30.8 g/dL — ABNORMAL LOW (ref 31.5–36.0)
MCV: 75.1 fL — AB (ref 79.5–101.0)
MONO#: 0.4 10*3/uL (ref 0.1–0.9)
MONO%: 7.2 % (ref 0.0–14.0)
NEUT%: 70.2 % (ref 38.4–76.8)
NEUTROS ABS: 3.7 10*3/uL (ref 1.5–6.5)
NRBC: 0 % (ref 0–0)
Platelets: 198 10*3/uL (ref 145–400)
RBC: 5.34 10*6/uL (ref 3.70–5.45)
RDW: 17.2 % — ABNORMAL HIGH (ref 11.2–14.5)
WBC: 5.3 10*3/uL (ref 3.9–10.3)
lymph#: 1.1 10*3/uL (ref 0.9–3.3)

## 2016-04-24 LAB — COMPREHENSIVE METABOLIC PANEL
ALBUMIN: 2.8 g/dL — AB (ref 3.5–5.0)
ALK PHOS: 150 U/L (ref 40–150)
ALT: <6 U/L (ref 0–55)
AST: 11 U/L (ref 5–34)
Anion Gap: 7 mEq/L (ref 3–11)
BILIRUBIN TOTAL: 0.46 mg/dL (ref 0.20–1.20)
BUN: 13.1 mg/dL (ref 7.0–26.0)
CALCIUM: 9.1 mg/dL (ref 8.4–10.4)
CO2: 26 mEq/L (ref 22–29)
Chloride: 109 mEq/L (ref 98–109)
Creatinine: 1.1 mg/dL (ref 0.6–1.1)
EGFR: 61 mL/min/{1.73_m2} — ABNORMAL LOW (ref >90)
GLUCOSE: 93 mg/dL (ref 70–140)
POTASSIUM: 4 meq/L (ref 3.5–5.1)
Sodium: 141 mEq/L (ref 136–145)
TOTAL PROTEIN: 7.1 g/dL (ref 6.4–8.3)

## 2016-04-24 MED ORDER — SODIUM CHLORIDE 0.9 % IJ SOLN
10.0000 mL | INTRAMUSCULAR | Status: AC | PRN
  Administered 2016-04-24: 10 mL via INTRAVENOUS
  Filled 2016-04-24: qty 10

## 2016-04-24 MED ORDER — HEPARIN SOD (PORK) LOCK FLUSH 100 UNIT/ML IV SOLN
500.0000 [IU] | Freq: Once | INTRAVENOUS | Status: AC | PRN
  Administered 2016-04-24: 500 [IU]
  Filled 2016-04-24: qty 5

## 2016-04-24 MED ORDER — DEXAMETHASONE SODIUM PHOSPHATE 10 MG/ML IJ SOLN
INTRAMUSCULAR | Status: AC
  Filled 2016-04-24: qty 1

## 2016-04-24 MED ORDER — SODIUM CHLORIDE 0.9 % IV SOLN: 10.0000 mg | Freq: Once | INTRAVENOUS | Status: DC

## 2016-04-24 MED ORDER — SODIUM CHLORIDE 0.9 % IV SOLN
168.2000 mg | Freq: Once | INTRAVENOUS | Status: AC
  Administered 2016-04-24: 170 mg via INTRAVENOUS
  Filled 2016-04-24: qty 17

## 2016-04-24 MED ORDER — DEXAMETHASONE SODIUM PHOSPHATE 10 MG/ML IJ SOLN
10.0000 mg | Freq: Once | INTRAMUSCULAR | Status: AC
  Administered 2016-04-24: 10 mg via INTRAVENOUS

## 2016-04-24 MED ORDER — SODIUM CHLORIDE 0.9% FLUSH
10.0000 mL | INTRAVENOUS | Status: DC | PRN
  Administered 2016-04-24: 10 mL
  Filled 2016-04-24: qty 10

## 2016-04-24 MED ORDER — PALONOSETRON HCL INJECTION 0.25 MG/5ML
INTRAVENOUS | Status: AC
Start: 2016-04-24 — End: 2016-04-24
  Filled 2016-04-24: qty 5

## 2016-04-24 MED ORDER — GEMCITABINE HCL CHEMO INJECTION 1 GM/26.3ML
1000.0000 mg/m2 | Freq: Once | INTRAVENOUS | Status: AC
  Administered 2016-04-24: 1862 mg via INTRAVENOUS
  Filled 2016-04-24: qty 48.97

## 2016-04-24 MED ORDER — PALONOSETRON HCL INJECTION 0.25 MG/5ML
0.2500 mg | Freq: Once | INTRAVENOUS | Status: AC
  Administered 2016-04-24: 0.25 mg via INTRAVENOUS

## 2016-04-24 MED ORDER — SODIUM CHLORIDE 0.9 % IV SOLN
Freq: Once | INTRAVENOUS | Status: AC
  Administered 2016-04-24: 13:00:00 via INTRAVENOUS

## 2016-04-24 NOTE — Patient Instructions (Signed)
[facility] Discharge Instructions for Patients Receiving Chemotherapy  Today you received the following chemotherapy agents: Gemzar and Carboplatin  To help prevent nausea and vomiting after your treatment, we encourage you to take your nausea medication as directed.   If you develop nausea and vomiting that is not controlled by your nausea medication, call the clinic.   BELOW ARE SYMPTOMS THAT SHOULD BE REPORTED IMMEDIATELY:  *FEVER GREATER THAN 100.5 F  *CHILLS WITH OR WITHOUT FEVER  NAUSEA AND VOMITING THAT IS NOT CONTROLLED WITH YOUR NAUSEA MEDICATION  *UNUSUAL SHORTNESS OF BREATH  *UNUSUAL BRUISING OR BLEEDING  TENDERNESS IN MOUTH AND THROAT WITH OR WITHOUT PRESENCE OF ULCERS  *URINARY PROBLEMS  *BOWEL PROBLEMS  UNUSUAL RASH Items with * indicate a potential emergency and should be followed up as soon as possible.  Feel free to call the clinic you have any questions or concerns. The clinic phone number is [phone].  Please show the [facility] at check-in to the Emergency Department and triage nurse.  Gemcitabine injection What is this medicine? GEMCITABINE (jem SIT a been) is a chemotherapy drug. This medicine is used to treat many types of cancer like breast cancer, lung cancer, pancreatic cancer, and ovarian cancer. This medicine may be used for other purposes; ask your health care provider or pharmacist if you have questions. COMMON BRAND NAME(S): Gemzar What should I tell my health care provider before I take this medicine? They need to know if you have any of these conditions: -blood disorders -infection -kidney disease -liver disease -recent or ongoing radiation therapy -an unusual or allergic reaction to gemcitabine, other chemotherapy, other medicines, foods, dyes, or preservatives -pregnant or trying to get pregnant -breast-feeding How should I use this medicine? This drug is given as an infusion into a vein. It is  administered in a hospital or clinic by a specially trained health care professional. Talk to your pediatrician regarding the use of this medicine in children. Special care may be needed. Overdosage: If you think you have taken too much of this medicine contact a poison control center or emergency room at once. NOTE: This medicine is only for you. Do not share this medicine with others. What if I miss a dose? It is important not to miss your dose. Call your doctor or health care professional if you are unable to keep an appointment. What may interact with this medicine? -medicines to increase blood counts like filgrastim, pegfilgrastim, sargramostim -some other chemotherapy drugs like cisplatin -vaccines Talk to your doctor or health care professional before taking any of these medicines: -acetaminophen -aspirin -ibuprofen -ketoprofen -naproxen This list may not describe all possible interactions. Give your health care provider a list of all the medicines, herbs, non-prescription drugs, or dietary supplements you use. Also tell them if you smoke, drink alcohol, or use illegal drugs. Some items may interact with your medicine. What should I watch for while using this medicine? Visit your doctor for checks on your progress. This drug may make you feel generally unwell. This is not uncommon, as chemotherapy can affect healthy cells as well as cancer cells. Report any side effects. Continue your course of treatment even though you feel ill unless your doctor tells you to stop. In some cases, you may be given additional medicines to help with side effects. Follow all directions for their use. Call your doctor or health care professional for advice if you get a fever, chills or sore throat, or other symptoms of a cold  or flu. Do not treat yourself. This drug decreases your body's ability to fight infections. Try to avoid being around people who are sick. This medicine may increase your risk to bruise  or bleed. Call your doctor or health care professional if you notice any unusual bleeding. Be careful brushing and flossing your teeth or using a toothpick because you may get an infection or bleed more easily. If you have any dental work done, tell your dentist you are receiving this medicine. Avoid taking products that contain aspirin, acetaminophen, ibuprofen, naproxen, or ketoprofen unless instructed by your doctor. These medicines may hide a fever. Women should inform their doctor if they wish to become pregnant or think they might be pregnant. There is a potential for serious side effects to an unborn child. Talk to your health care professional or pharmacist for more information. Do not breast-feed an infant while taking this medicine. What side effects may I notice from receiving this medicine? Side effects that you should report to your doctor or health care professional as soon as possible: -allergic reactions like skin rash, itching or hives, swelling of the face, lips, or tongue -low blood counts - this medicine may decrease the number of white blood cells, red blood cells and platelets. You may be at increased risk for infections and bleeding. -signs of infection - fever or chills, cough, sore throat, pain or difficulty passing urine -signs of decreased platelets or bleeding - bruising, pinpoint red spots on the skin, black, tarry stools, blood in the urine -signs of decreased red blood cells - unusually weak or tired, fainting spells, lightheadedness -breathing problems -chest pain -mouth sores -nausea and vomiting -pain, swelling, redness at site where injected -pain, tingling, numbness in the hands or feet -stomach pain -swelling of ankles, feet, hands -unusual bleeding Side effects that usually do not require medical attention (report to your doctor or health care professional if they continue or are bothersome): -constipation -diarrhea -hair loss -loss of appetite -stomach  upset This list may not describe all possible side effects. Call your doctor for medical advice about side effects. You may report side effects to FDA at 1-800-FDA-1088. Where should I keep my medicine? This drug is given in a hospital or clinic and will not be stored at home. NOTE: This sheet is a summary. It may not cover all possible information. If you have questions about this medicine, talk to your doctor, pharmacist, or health care provider.  2017 Elsevier/Gold Standard (2007-09-13 18:45:54)  Carboplatin injection What is this medicine? CARBOPLATIN (KAR boe pla tin) is a chemotherapy drug. It targets fast dividing cells, like cancer cells, and causes these cells to die. This medicine is used to treat ovarian cancer and many other cancers. This medicine may be used for other purposes; ask your health care provider or pharmacist if you have questions. COMMON BRAND NAME(S): Paraplatin What should I tell my health care provider before I take this medicine? They need to know if you have any of these conditions: -blood disorders -hearing problems -kidney disease -recent or ongoing radiation therapy -an unusual or allergic reaction to carboplatin, cisplatin, other chemotherapy, other medicines, foods, dyes, or preservatives -pregnant or trying to get pregnant -breast-feeding How should I use this medicine? This drug is usually given as an infusion into a vein. It is administered in a hospital or clinic by a specially trained health care professional. Talk to your pediatrician regarding the use of this medicine in children. Special care may be needed. Overdosage: If  you think you have taken too much of this medicine contact a poison control center or emergency room at once. NOTE: This medicine is only for you. Do not share this medicine with others. What if I miss a dose? It is important not to miss a dose. Call your doctor or health care professional if you are unable to keep an  appointment. What may interact with this medicine? -medicines for seizures -medicines to increase blood counts like filgrastim, pegfilgrastim, sargramostim -some antibiotics like amikacin, gentamicin, neomycin, streptomycin, tobramycin -vaccines Talk to your doctor or health care professional before taking any of these medicines: -acetaminophen -aspirin -ibuprofen -ketoprofen -naproxen This list may not describe all possible interactions. Give your health care provider a list of all the medicines, herbs, non-prescription drugs, or dietary supplements you use. Also tell them if you smoke, drink alcohol, or use illegal drugs. Some items may interact with your medicine. What should I watch for while using this medicine? Your condition will be monitored carefully while you are receiving this medicine. You will need important blood work done while you are taking this medicine. This drug may make you feel generally unwell. This is not uncommon, as chemotherapy can affect healthy cells as well as cancer cells. Report any side effects. Continue your course of treatment even though you feel ill unless your doctor tells you to stop. In some cases, you may be given additional medicines to help with side effects. Follow all directions for their use. Call your doctor or health care professional for advice if you get a fever, chills or sore throat, or other symptoms of a cold or flu. Do not treat yourself. This drug decreases your body's ability to fight infections. Try to avoid being around people who are sick. This medicine may increase your risk to bruise or bleed. Call your doctor or health care professional if you notice any unusual bleeding. Be careful brushing and flossing your teeth or using a toothpick because you may get an infection or bleed more easily. If you have any dental work done, tell your dentist you are receiving this medicine. Avoid taking products that contain aspirin, acetaminophen,  ibuprofen, naproxen, or ketoprofen unless instructed by your doctor. These medicines may hide a fever. Do not become pregnant while taking this medicine. Women should inform their doctor if they wish to become pregnant or think they might be pregnant. There is a potential for serious side effects to an unborn child. Talk to your health care professional or pharmacist for more information. Do not breast-feed an infant while taking this medicine. What side effects may I notice from receiving this medicine? Side effects that you should report to your doctor or health care professional as soon as possible: -allergic reactions like skin rash, itching or hives, swelling of the face, lips, or tongue -signs of infection - fever or chills, cough, sore throat, pain or difficulty passing urine -signs of decreased platelets or bleeding - bruising, pinpoint red spots on the skin, black, tarry stools, nosebleeds -signs of decreased red blood cells - unusually weak or tired, fainting spells, lightheadedness -breathing problems -changes in hearing -changes in vision -chest pain -high blood pressure -low blood counts - This drug may decrease the number of white blood cells, red blood cells and platelets. You may be at increased risk for infections and bleeding. -nausea and vomiting -pain, swelling, redness or irritation at the injection site -pain, tingling, numbness in the hands or feet -problems with balance, talking, walking -trouble passing urine  or change in the amount of urine Side effects that usually do not require medical attention (report to your doctor or health care professional if they continue or are bothersome): -hair loss -loss of appetite -metallic taste in the mouth or changes in taste This list may not describe all possible side effects. Call your doctor for medical advice about side effects. You may report side effects to FDA at 1-800-FDA-1088. Where should I keep my medicine? This drug  is given in a hospital or clinic and will not be stored at home. NOTE: This sheet is a summary. It may not cover all possible information. If you have questions about this medicine, talk to your doctor, pharmacist, or health care provider.  2017 Elsevier/Gold Standard (2007-08-09 14:38:05)

## 2016-04-27 LAB — TSH: TSH: 1.894 m[IU]/L (ref 0.308–3.960)

## 2016-04-30 ENCOUNTER — Ambulatory Visit (HOSPITAL_COMMUNITY)
Admission: RE | Admit: 2016-04-30 | Discharge: 2016-04-30 | Disposition: A | Discharge status: Home / Self Care | Payer: Commercial Managed Care - HMO | Source: Ambulatory Visit | Attending: Hematology and Oncology | Admitting: Hematology and Oncology

## 2016-04-30 ENCOUNTER — Other Ambulatory Visit: Payer: Self-pay | Admitting: *Deleted

## 2016-04-30 DIAGNOSIS — C7931 Secondary malignant neoplasm of brain: Secondary | ICD-10-CM | POA: Insufficient documentation

## 2016-04-30 DIAGNOSIS — C3490 Malignant neoplasm of unspecified part of unspecified bronchus or lung: Secondary | ICD-10-CM | POA: Diagnosis not present

## 2016-04-30 DIAGNOSIS — C3412 Malignant neoplasm of upper lobe, left bronchus or lung: Secondary | ICD-10-CM | POA: Insufficient documentation

## 2016-04-30 DIAGNOSIS — C7949 Secondary malignant neoplasm of other parts of nervous system: Secondary | ICD-10-CM | POA: Diagnosis not present

## 2016-04-30 MED ORDER — GADOBENATE DIMEGLUMINE 529 MG/ML IV SOLN
20.0000 mL | Freq: Once | INTRAVENOUS | Status: AC | PRN
  Administered 2016-04-30: 16 mL via INTRAVENOUS

## 2016-05-01 ENCOUNTER — Encounter: Payer: Self-pay | Admitting: *Deleted

## 2016-05-01 ENCOUNTER — Telehealth: Payer: Self-pay | Admitting: *Deleted

## 2016-05-01 ENCOUNTER — Other Ambulatory Visit (HOSPITAL_BASED_OUTPATIENT_CLINIC_OR_DEPARTMENT_OTHER): Payer: Commercial Managed Care - HMO

## 2016-05-01 ENCOUNTER — Ambulatory Visit (HOSPITAL_BASED_OUTPATIENT_CLINIC_OR_DEPARTMENT_OTHER): Payer: Commercial Managed Care - HMO

## 2016-05-01 ENCOUNTER — Ambulatory Visit (HOSPITAL_BASED_OUTPATIENT_CLINIC_OR_DEPARTMENT_OTHER): Payer: Commercial Managed Care - HMO | Admitting: Hematology and Oncology

## 2016-05-01 ENCOUNTER — Encounter: Payer: Self-pay | Admitting: Hematology and Oncology

## 2016-05-01 ENCOUNTER — Telehealth: Payer: Self-pay | Admitting: Hematology and Oncology

## 2016-05-01 ENCOUNTER — Other Ambulatory Visit: Payer: Self-pay | Admitting: Hematology and Oncology

## 2016-05-01 DIAGNOSIS — N183 Chronic kidney disease, stage 3 unspecified: Secondary | ICD-10-CM | POA: Insufficient documentation

## 2016-05-01 DIAGNOSIS — R530 Neoplastic (malignant) related fatigue: Secondary | ICD-10-CM

## 2016-05-01 DIAGNOSIS — E441 Mild protein-calorie malnutrition: Secondary | ICD-10-CM

## 2016-05-01 DIAGNOSIS — Z85038 Personal history of other malignant neoplasm of large intestine: Secondary | ICD-10-CM

## 2016-05-01 DIAGNOSIS — Z5111 Encounter for antineoplastic chemotherapy: Secondary | ICD-10-CM | POA: Diagnosis not present

## 2016-05-01 DIAGNOSIS — C7931 Secondary malignant neoplasm of brain: Secondary | ICD-10-CM

## 2016-05-01 DIAGNOSIS — C3412 Malignant neoplasm of upper lobe, left bronchus or lung: Secondary | ICD-10-CM

## 2016-05-01 DIAGNOSIS — R634 Abnormal weight loss: Secondary | ICD-10-CM

## 2016-05-01 DIAGNOSIS — F17200 Nicotine dependence, unspecified, uncomplicated: Secondary | ICD-10-CM

## 2016-05-01 DIAGNOSIS — C7949 Secondary malignant neoplasm of other parts of nervous system: Principal | ICD-10-CM

## 2016-05-01 DIAGNOSIS — Z5181 Encounter for therapeutic drug level monitoring: Secondary | ICD-10-CM

## 2016-05-01 DIAGNOSIS — D61818 Other pancytopenia: Secondary | ICD-10-CM | POA: Diagnosis not present

## 2016-05-01 DIAGNOSIS — Z79899 Other long term (current) drug therapy: Secondary | ICD-10-CM

## 2016-05-01 DIAGNOSIS — Z720 Tobacco use: Secondary | ICD-10-CM

## 2016-05-01 LAB — CBC WITH DIFFERENTIAL/PLATELET
BASO%: 0.5 % (ref 0.0–2.0)
BASOS ABS: 0 10*3/uL (ref 0.0–0.1)
EOS%: 1 % (ref 0.0–7.0)
Eosinophils Absolute: 0 10*3/uL (ref 0.0–0.5)
HCT: 39.5 % (ref 34.8–46.6)
HGB: 12.3 g/dL (ref 11.6–15.9)
LYMPH%: 32.2 % (ref 14.0–49.7)
MCH: 23.7 pg — AB (ref 25.1–34.0)
MCHC: 31.1 g/dL — ABNORMAL LOW (ref 31.5–36.0)
MCV: 76.1 fL — ABNORMAL LOW (ref 79.5–101.0)
MONO#: 0.1 10*3/uL (ref 0.1–0.9)
MONO%: 3 % (ref 0.0–14.0)
NEUT%: 63.3 % (ref 38.4–76.8)
NEUTROS ABS: 1.3 10*3/uL — AB (ref 1.5–6.5)
Platelets: 121 10*3/uL — ABNORMAL LOW (ref 145–400)
RBC: 5.19 10*6/uL (ref 3.70–5.45)
RDW: 15.7 % — AB (ref 11.2–14.5)
WBC: 2 10*3/uL — AB (ref 3.9–10.3)
lymph#: 0.6 10*3/uL — ABNORMAL LOW (ref 0.9–3.3)

## 2016-05-01 LAB — COMPREHENSIVE METABOLIC PANEL
ALBUMIN: 2.8 g/dL — AB (ref 3.5–5.0)
ALK PHOS: 133 U/L (ref 40–150)
ALT: 19 U/L (ref 0–55)
AST: 18 U/L (ref 5–34)
Anion Gap: 8 mEq/L (ref 3–11)
BUN: 14.5 mg/dL (ref 7.0–26.0)
CHLORIDE: 107 meq/L (ref 98–109)
CO2: 24 mEq/L (ref 22–29)
Calcium: 9.3 mg/dL (ref 8.4–10.4)
Creatinine: 1.2 mg/dL — ABNORMAL HIGH (ref 0.6–1.1)
EGFR: 55 mL/min/{1.73_m2} — ABNORMAL LOW (ref >90)
GLUCOSE: 93 mg/dL (ref 70–140)
POTASSIUM: 4.2 meq/L (ref 3.5–5.1)
SODIUM: 139 meq/L (ref 136–145)
Total Bilirubin: 0.39 mg/dL (ref 0.20–1.20)
Total Protein: 7.3 g/dL (ref 6.4–8.3)

## 2016-05-01 LAB — TSH: TSH: 1.502 m(IU)/L (ref 0.308–3.960)

## 2016-05-01 MED ORDER — DEXAMETHASONE SODIUM PHOSPHATE 10 MG/ML IJ SOLN
INTRAMUSCULAR | Status: AC
  Filled 2016-05-01: qty 1

## 2016-05-01 MED ORDER — SODIUM CHLORIDE 0.9% FLUSH
10.0000 mL | INTRAVENOUS | Status: DC | PRN
  Administered 2016-05-01: 10 mL
  Filled 2016-05-01: qty 10

## 2016-05-01 MED ORDER — DEXAMETHASONE SODIUM PHOSPHATE 10 MG/ML IJ SOLN
10.0000 mg | Freq: Once | INTRAMUSCULAR | Status: AC
  Administered 2016-05-01: 10 mg via INTRAVENOUS

## 2016-05-01 MED ORDER — PALONOSETRON HCL INJECTION 0.25 MG/5ML
0.2500 mg | Freq: Once | INTRAVENOUS | Status: AC
  Administered 2016-05-01: 0.25 mg via INTRAVENOUS

## 2016-05-01 MED ORDER — HEPARIN SOD (PORK) LOCK FLUSH 100 UNIT/ML IV SOLN
500.0000 [IU] | Freq: Once | INTRAVENOUS | Status: AC | PRN
  Administered 2016-05-01: 500 [IU]
  Filled 2016-05-01: qty 5

## 2016-05-01 MED ORDER — PALONOSETRON HCL INJECTION 0.25 MG/5ML
INTRAVENOUS | Status: AC
  Filled 2016-05-01: qty 5

## 2016-05-01 MED ORDER — SODIUM CHLORIDE 0.9 % IV SOLN
158.4000 mg | Freq: Once | INTRAVENOUS | Status: AC
  Administered 2016-05-01: 160 mg via INTRAVENOUS
  Filled 2016-05-01: qty 16

## 2016-05-01 MED ORDER — SODIUM CHLORIDE 0.9 % IV SOLN
800.0000 mg/m2 | Freq: Once | INTRAVENOUS | Status: AC
  Administered 2016-05-01: 1482 mg via INTRAVENOUS
  Filled 2016-05-01: qty 38.98

## 2016-05-01 MED ORDER — SODIUM CHLORIDE 0.9 % IV SOLN
Freq: Once | INTRAVENOUS | Status: AC
  Administered 2016-05-01: 13:00:00 via INTRAVENOUS

## 2016-05-01 NOTE — Progress Notes (Signed)
[facility] OFFICE PROGRESS NOTE  Patient Care Team: [name], MD as PCP - General (Family Medicine) Gaye Pollack, MD (Cardiothoracic Surgery) Dickie La, MD (Family Medicine) Heath Lark, MD as Consulting Physician (Hematology and Oncology) Verlin Grills, RN as Lauren [name], [place] (Optometry) Milus Banister, MD as Consulting Physician (Gastroenterology) Francis Gaines, LCSW as [place] [facility] Melida Quitter, MD as Consulting Physician (Otolaryngology)  SUMMARY OF ONCOLOGIC HISTORY: Oncology History   Colon cancer   Primary site: Colon and Rectum (Left)   Staging method: AJCC 7th Edition   Clinical: Stage I (T2, N0, M0) signed by Heath Lark, MD on 05/31/2013  2:39 PM   Pathologic: Stage I (T2, N0, cM0) signed by Heath Lark, MD on 05/31/2013  2:39 PM   Summary: Stage I (T2, N0, cM0) Lung cancer, EGFR/ALK negative, recurrence after initial resection to LN and brain   Primary site: Lung (Left)   Staging method: AJCC 7th Edition   Clinical: Stage IV (T1, N2, M1b) signed by Heath Lark, MD on 05/31/2013  2:26 PM   Pathologic: Stage IV (T1, N2, M1b) signed by Heath Lark, MD on 05/31/2013  2:26 PM   Summary: Stage IV (T1, N2, M1b)       Cancer of upper lobe of left lung, Adenocarcinoma   03/01/2007 Procedure    Colonoscopy revealed abnormalities and biopsy show high-grade dysplasia      04/01/2007 Surgery    She underwent sigmoid resection which showed T2 N0 colon cancer, and negative margins and all of 17 lymph nodes were negative      02/29/2008 Procedure    Repeat surveillance colonoscopy was negative.      06/16/2010 Surgery    She underwent left upper lobectomy we show well-differentiated adenocarcinoma of the lung, T1, N0, M0      03/18/2011 Procedure    Repeat colonoscopy show multiple polyps but there were benign      10/19/2011 Procedure    Biopsy of mediastinal lymph node  came back positive for recurrence of lung cancer, EGFR and ALK negative      11/16/2011 - 12/14/2011 Chemotherapy    She received concurrent chemoradiation therapy with weekly carboplatin and Taxol.      11/16/2011 - 12/29/2011 Radiation Therapy    She received radiation therapy with weekly chemotherapy      02/15/2012 Imaging    MR of the brain showed a new intracranial metastases. This was subsequently treated with stereotactic radiosurgery.      03/07/2012 - 04/12/2013 Chemotherapy    She received chemotherapy with maintainence Alimta every 3 weeks. Chemotherapy was discontinued due to profound fatigue      03/02/2013 Procedure    She had therapeutic ultrasound guidance thoracentesis for pleural effusion that came back negative for cancer      05/04/2013 Procedure    She had repeat ultrasound-guided thoracentesis again and cytology was negative      05/29/2013 Imaging    Repeat CT scan of the chest, abdomen and pelvis show no evidence of disease but persistent right-sided pleural effusion      06/02/2013 Surgery    The patient had placement of Pleurx catheter and subsequently underwent pleurodesis.      09/22/2013 Imaging    Repeat CT scan show no evidence of active disease. There are nonspecific lymphadenopathy and she is placed on observation.      03/23/2014 Imaging    Repeat CT scan  of the chest, abdomen and pelvis show recurrence of cancer with widespread bilateral pulmonary metastasis.      05/14/2014 Imaging    Imaging of the chest and brain were repeated due to delay of initiation of chemotherapy. Overall, chest CT scan show stable disease. MRI of the head was negative for recurrence      05/16/2014 - 07/18/2014 Chemotherapy     she completed 4 cycles of combination chemotherapy with carboplatin and Alimta      07/16/2014 Imaging     repeat CT scan of the chest, abdomen and pelvis show regression in the size of lung nodules.      08/29/2014 - 08/14/2015 Chemotherapy     She received maintenance Alimta      10/16/2014 Imaging     repeat CT scan of the chest, abdomen and pelvis show regression in the size of lung nodules.      01/29/2015 Imaging    Repeat CT scan showed stable disease      02/01/2015 Imaging    MRI brain is negative      04/30/2015 Imaging    CT scan of the chest, abdomen and pelvis showed stable disease      07/25/2015 Imaging    MRI brain showed no evidence of new disease      09/09/2015 Imaging    CT chest showed interval increase in size of large RIGHT upper lobe nodule is most consistent with lung cancer recurrence.      09/18/2015 - 03/26/2016 Chemotherapy    She started on Nivolumab      10/22/2015 Imaging    Screening mammogram showed mild abnormality      10/31/2015 Imaging    Diagnostic mammogram showed mild calcification at the right upper outer breast      11/27/2015 Imaging    Stable post treatment related changes of left lower lobectomy and radiation therapy redemonstrated, as above. No definite findings to suggest local recurrence of disease or metastatic disease on today's examination      01/24/2016 Imaging    MR brain showed continued stable appearance of two small treated brain metastases. No new or progressive metastatic disease to the brain.      04/15/2016 Imaging    Ct scan showed findings highly suspicious for recurrent tumor in the mediastinum surrounding the right mainstem bronchus and in the region of the azygoesophageal recess. Diffuse pulmonary metastatic disease with new and progressive nodules since the prior examination.      04/24/2016 -  Chemotherapy    The patient had chemotherapy with gemzar and Carboplatin      04/30/2016 Imaging    Decreased contrast enhancement of the two treated metastases in the right frontal and left occipital lobes. No new lesions.       Brain metastases treated with radiosurgery   02/26/2012 Initial Diagnosis    Brain metastases treated with radiosurgery        INTERVAL HISTORY: Please see below for problem oriented charting. She is seen today prior to cycle 1 day 8 of treatment. She complained of recent fatigue, poor appetite and 5 pound weight loss since treatment. She has some mild headache. No worsening neuropathy. Denies recent infection. She continues to mild chronic coughing continues to smoke  REVIEW OF SYSTEMS:   Constitutional: Denies fevers, chills  Eyes: Denies blurriness of vision Ears, nose, mouth, throat, and face: Denies mucositis or sore throat Cardiovascular: Denies palpitation, chest discomfort or lower extremity swelling Gastrointestinal:  Denies nausea, heartburn or change  in bowel habits Skin: Denies abnormal skin rashes Lymphatics: Denies new lymphadenopathy or easy bruising Neurological:Denies numbness, tingling or new weaknesses Behavioral/Psych: Mood is stable, no new changes  All other systems were reviewed with the patient and are negative.  I have reviewed the past medical history, past surgical history, social history and family history with the patient and they are unchanged from previous note.  ALLERGIES:  is allergic to adhesive [tape] and lisinopril.  MEDICATIONS:  Current Outpatient Prescriptions  Medication Sig Dispense Refill  . albuterol (PROVENTIL HFA;VENTOLIN HFA) 108 (90 Base) MCG/ACT inhaler Inhale 1 puff into the lungs 2 (two) times daily. 6.7 g 6  . aspirin 81 MG chewable tablet Chew 81 mg by mouth daily.    Marland Kitchen BYSTOLIC 10 MG tablet TAKE 1 TABLET (10 MG TOTAL) BY MOUTH DAILY. 90 tablet 3  . CAPSAICIN EX Apply topically as directed. To feet     . cholecalciferol (VITAMIN D) 1000 units tablet Take 1,000 Units by mouth daily.    . clotrimazole (LOTRIMIN) 1 % cream Apply to skin of hands, wrist, and feet three times a day. Continue for one month. 113 g 0  . COLACE 100 MG capsule Take 100 mg by mouth daily.    . diclofenac sodium (VOLTAREN) 1 % GEL Apply 2 g topically 4 (four) times daily. As  needed for pain 100 g 3  . gabapentin (NEURONTIN) 300 MG capsule Take 1 capsule (300 mg total) by mouth 3 (three) times daily. (Patient taking differently: Take 300 mg by mouth daily. ) 90 capsule 1  . HYDROcodone-homatropine (HYCODAN) 5-1.5 MG/5ML syrup Take 5 mLs by mouth every 6 (six) hours as needed for cough. (Patient not taking: Reported on 04/23/2016) 240 mL 0  . lidocaine-prilocaine (EMLA) cream Apply topically as needed. Apply to porta cath site one hour prior to needle stick. 30 g 6  . nitroGLYCERIN (NITROSTAT) 0.4 MG SL tablet Place 1 tablet (0.4 mg total) under the tongue every 5 (five) minutes as needed. For chest pain 25 tablet 5  . omeprazole (PRILOSEC) 40 MG capsule Take 40 mg by mouth daily.    . ondansetron (ZOFRAN ODT) 4 MG disintegrating tablet Take 1 tablet (4 mg total) by mouth every 8 (eight) hours as needed for nausea or vomiting. 20 tablet 0  . potassium chloride SA (K-DUR,KLOR-CON) 20 MEQ tablet Take 20 mEq by mouth 2 (two) times daily.    . pravastatin (PRAVACHOL) 40 MG tablet Take 40 mg by mouth daily.    Marland Kitchen tiZANidine (ZANAFLEX) 2 MG tablet Take 1 tablet (2 mg total) by mouth every 6 (six) hours as needed (Back Muscle Spasm). 30 tablet 3  . traMADol (ULTRAM) 50 MG tablet Take 1 tablet (50 mg total) by mouth every 6 (six) hours as needed for moderate pain or severe pain. 90 tablet 1  . umeclidinium-vilanterol (ANORO ELLIPTA) 62.5-25 MCG/INH AEPB Inhale 1 puff into the lungs daily. 1 each 0   No current facility-administered medications for this visit.    Facility-Administered Medications Ordered in Other Visits  Medication Dose Route Frequency Provider Last Rate Last Dose  . sodium chloride 0.9 % injection 10 mL  10 mL Intravenous PRN Heath Lark, MD   10 mL at 09/18/15 1345  . sodium chloride 0.9 % injection 10 mL  10 mL Intravenous PRN Heath Lark, MD   10 mL at 04/24/16 1136    PHYSICAL EXAMINATION: ECOG PERFORMANCE STATUS: 1 - Symptomatic but completely  ambulatory  Vitals:  05/01/16 1128  BP: (!) 143/69  Pulse: 80  Resp: 18  Temp: 98 F (36.7 C)   Filed Weights   05/01/16 1128  Weight: 164 lb 6.4 oz (74.6 kg)    GENERAL:alert, no distress and comfortable SKIN: skin color, texture, turgor are normal, no rashes or significant lesions EYES: normal, Conjunctiva are pink and non-injected, sclera clear OROPHARYNX:no exudate, no erythema and lips, buccal mucosa, and tongue normal  NECK: supple, thyroid normal size, non-tender, without nodularity LYMPH:  no palpable lymphadenopathy in the cervical, axillary or inguinal LUNGS: clear to auscultation and percussion with normal breathing effort HEART: regular rate & rhythm and no murmurs and no lower extremity edema ABDOMEN:abdomen soft, non-tender and normal bowel sounds Musculoskeletal:no cyanosis of digits and no clubbing  NEURO: alert & oriented x 3 with fluent speech, no focal motor/sensory deficits  LABORATORY DATA:  I have reviewed the data as listed    Component Value Date/Time   NA 139 05/01/2016 1113   K 4.2 05/01/2016 1113   CL 106 12/26/2013 0946   CL 102 10/24/2012 1001   CO2 24 05/01/2016 1113   GLUCOSE 93 05/01/2016 1113   GLUCOSE 111 (H) 10/24/2012 1001   BUN 14.5 05/01/2016 1113   CREATININE 1.2 (H) 05/01/2016 1113   CALCIUM 9.3 05/01/2016 1113   PROT 7.3 05/01/2016 1113   ALBUMIN 2.8 (L) 05/01/2016 1113   AST 18 05/01/2016 1113   ALT 19 05/01/2016 1113   ALKPHOS 133 05/01/2016 1113   BILITOT 0.39 05/01/2016 1113   GFRNONAA 74 (L) 06/12/2013 0625   GFRAA 86 (L) 06/12/2013 0625    No results found for: SPEP, UPEP  Lab Results  Component Value Date   WBC 2.0 (L) 05/01/2016   NEUTROABS 1.3 (L) 05/01/2016   HGB 12.3 05/01/2016   HCT 39.5 05/01/2016   MCV 76.1 (L) 05/01/2016   PLT 121 (L) 05/01/2016      Chemistry      Component Value Date/Time   NA 139 05/01/2016 1113   K 4.2 05/01/2016 1113   CL 106 12/26/2013 0946   CL 102 10/24/2012 1001    CO2 24 05/01/2016 1113   BUN 14.5 05/01/2016 1113   CREATININE 1.2 (H) 05/01/2016 1113      Component Value Date/Time   CALCIUM 9.3 05/01/2016 1113   ALKPHOS 133 05/01/2016 1113   AST 18 05/01/2016 1113   ALT 19 05/01/2016 1113   BILITOT 0.39 05/01/2016 1113       RADIOGRAPHIC STUDIES: I have personally reviewed the radiological images as listed and agreed with the findings in the report. Ct Chest W Contrast  Result Date: 04/16/2016 CLINICAL DATA:  Colon cancer with lung metastasis.  Restaging. EXAM: CT CHEST WITH CONTRAST TECHNIQUE: Multidetector CT imaging of the chest was performed during intravenous contrast administration. CONTRAST:  2m ISOVUE-300 IOPAMIDOL (ISOVUE-300) INJECTION 61% COMPARISON:  11/26/2015 FINDINGS: Chest wall: Right-sided Port-A-Cath is stable. No breast masses, supraclavicular or axillary lymphadenopathy. Stable cystic lesion in the left thyroid lobe. Cardiovascular: Feet heart is normal in size and stable. No pericardial effusion. Stable advanced atherosclerotic calcifications, tortuosity and ectasia of the thoracic aorta. Stable three-vessel coronary artery calcifications. Mediastinum/Nodes: Overall Stable right parahilar and mediastinal soft tissue density likely a combination of treated tumor and radiation change. There is 1 area of concern. There is increasing soft tissue density in the upper aspect of the as ago esophageal recess on image number 64 which measures 19.5 x 16.5 mm. This previously measured 13 x 13  mm. There also appears to be slight increasing mass effect on the left atrium. Could not exclude recurrent tumor. PET-CT suggested for further evaluation. Lungs/Pleura: Numerous new pulmonary nodules and enlarging pulmonary nodules consistent with widespread pulmonary metastatic disease. Some of these nodules are cavitary. Upper Abdomen: Enlarging subdiaphragmatic lymph nodes adjacent to the liver. The largest node on image number 96 measures 10 mm and  previously measured 7 mm. Stable low-attenuation right hepatic lobe lesion but no definite findings for new hepatic metastatic disease. Stable enlargement of the left adrenal gland. Musculoskeletal: No significant bony findings. IMPRESSION: 1. Findings highly suspicious for recurrent tumor in the mediastinum surrounding the right mainstem bronchus and in the region of the azygoesophageal recess. 2. Diffuse pulmonary metastatic disease with new and progressive nodules since the prior examination. Electronically Signed   By: Marijo Sanes M.D.   On: 04/16/2016 08:43   Mr Lauren Cos MA Contrast  Result Date: 04/30/2016 CLINICAL DATA:  Metastatic lung cancer. EXAM: MRI HEAD WITHOUT AND WITH CONTRAST TECHNIQUE: Multiplanar, multiecho pulse sequences of the brain and surrounding structures were obtained without and with intravenous contrast. CONTRAST:  23m MULTIHANCE GADOBENATE DIMEGLUMINE 529 MG/ML IV SOLN COMPARISON:  Brain MRI 01/24/2016 FINDINGS: Brain: The midline structures are normal. Punctate enhancing foci in the right frontal white matter and along the left occipital cortex (series 11, image 33) are slightly less conspicuous than on the prior examination, possibly owing to technical factors. No acute infarct or intraparenchymal hemorrhage. There is multifocal hyperintense T2-weighted signal within the periventricular white matter, most often seen in the setting of chronic microvascular ischemia. No mass lesion or midline shift. No hydrocephalus or extra-axial fluid collection. No age advanced or lobar predominant atrophy. Unchanged arachnoid cyst at the vertex. Vascular: Major intracranial arterial and venous sinus flow voids are preserved. No evidence of chronic microhemorrhage or amyloid angiopathy. Skull and upper cervical spine: The visualized skull base, calvarium, upper cervical spine and extracranial soft tissues are normal. Sinuses/Orbits: No fluid levels or advanced mucosal thickening. No mastoid  effusion. Normal orbits. IMPRESSION: Decreased contrast enhancement of the two treated metastases in the right frontal and left occipital lobes. No new lesions. Electronically Signed   By: KUlyses JarredM.D.   On: 04/30/2016 19:57    ASSESSMENT & PLAN:  Cancer of upper lobe of left lung, Adenocarcinoma She complained of profound fatigue, loss of appetite and 5 pound weight loss since chemotherapy last week. She is noted to be mildly pancytopenic I plan to reduce the dose of Gemzar and give her an extra week of break. We will resume cycle 2 of treatment after the new year  Brain metastases treated with radiosurgery Her most recent MRI in December 2017 showed no evidence of disease recurrence. Plan to continue surveillance imaging study every 3-4 months  Pancytopenia, acquired ([facility] This is due to treatment. She complained of excessive fatigue. I plan to reduce the dose of gemcitabine and give her extra week of break before cycle 2. We discussed neutropenic precautions  TOBACCO ABUSE I spent some time counseling the patient the importance of tobacco cessation. She is currently not interested to quit now.  Protein calorie malnutrition ([place] She has lost some weight due to reduced appetite after recent treatment. We discussed importance of additional nutritional supplement. I will consult dietitian while she is here. She will continue to increase oral intake as tolerated.  Chronic kidney disease, stage III (moderate) She has reduced oral intake causing some mild dehydration. Again, increase oral intake as  tolerated and I will get dietitian to see her today   No orders of the defined types were placed in this encounter.  All questions were answered. The patient knows to call the clinic with any problems, questions or concerns. No barriers to learning was detected. I spent 30 minutes counseling the patient face to face. The total time spent in the appointment was 40 minutes and more  than 50% was on counseling and review of test results     Heath Lark, MD 05/01/2016 12:11 PM

## 2016-05-01 NOTE — Assessment & Plan Note (Signed)
She has lost some weight due to reduced appetite after recent treatment. We discussed importance of additional nutritional supplement. I will consult dietitian while she is here. She will continue to increase oral intake as tolerated.

## 2016-05-01 NOTE — Patient Instructions (Signed)
[facility] Discharge Instructions for Patients Receiving Chemotherapy  Today you received the following chemotherapy agents: Gemzar and Carboplatin   To help prevent nausea and vomiting after your treatment, we encourage you to take your nausea medication as directed.    If you develop nausea and vomiting that is not controlled by your nausea medication, call the clinic.   BELOW ARE SYMPTOMS THAT SHOULD BE REPORTED IMMEDIATELY:  *FEVER GREATER THAN 100.5 F  *CHILLS WITH OR WITHOUT FEVER  NAUSEA AND VOMITING THAT IS NOT CONTROLLED WITH YOUR NAUSEA MEDICATION  *UNUSUAL SHORTNESS OF BREATH  *UNUSUAL BRUISING OR BLEEDING  TENDERNESS IN MOUTH AND THROAT WITH OR WITHOUT PRESENCE OF ULCERS  *URINARY PROBLEMS  *BOWEL PROBLEMS  UNUSUAL RASH Items with * indicate a potential emergency and should be followed up as soon as possible.  Feel free to call the clinic you have any questions or concerns. The clinic phone number is [phone].  Please show the CHEMO ALERT CARD at check-in to the Emergency Department and triage nurse.   

## 2016-05-01 NOTE — Telephone Encounter (Signed)
Per LOS I have scheduled appts and notified the scheduler 

## 2016-05-01 NOTE — Assessment & Plan Note (Signed)
Her most recent MRI in December 2017 showed no evidence of disease recurrence. Plan to continue surveillance imaging study every 3-4 months

## 2016-05-01 NOTE — Assessment & Plan Note (Signed)
I spent some time counseling the patient the importance of tobacco cessation. She is currently not interested to quit now. 

## 2016-05-01 NOTE — Assessment & Plan Note (Signed)
She has reduced oral intake causing some mild dehydration. Again, increase oral intake as tolerated and I will get dietitian to see her today

## 2016-05-01 NOTE — Telephone Encounter (Signed)
Appointments scheduled per 12/15 LOS. Patient given AVS report and calendars with future scheduled appointments. °

## 2016-05-01 NOTE — Assessment & Plan Note (Signed)
She complained of profound fatigue, loss of appetite and 5 pound weight loss since chemotherapy last week. She is noted to be mildly pancytopenic I plan to reduce the dose of Gemzar and give her an extra week of break. We will resume cycle 2 of treatment after the new year

## 2016-05-01 NOTE — Assessment & Plan Note (Signed)
This is due to treatment. She complained of excessive fatigue. I plan to reduce the dose of gemcitabine and give her extra week of break before cycle 2. We discussed neutropenic precautions

## 2016-05-01 NOTE — Patient Outreach (Signed)
[facility]) Care Management  05/01/2016  KOI YARBRO [birth year] [id number]   CSW received a new referral on patient from [name], Telephonic Nurse Case Manager with [facility], indicating that patient would benefit from social work services and resources to assist with making long-term care placement arrangements for patient.  Patient's son is currently living with her and she is managing his care; however, patient's cancer has returned, now appearing in both lungs, so patient will need to undergo another, more aggressive round of treatment, chemotherapy and radiation. CSW was able to make initial contact with patient today to perform phone assessment, as well as assess and assist with social work needs and services.  CSW introduced self, explained role and types of services provided through [facility] ([facility]).  CSW further explained to patient that CSW works with patient's Telephonic RNCM, also with [facility], [name]. CSW then explained the reason for the call, indicating that patient's RNCM thought that patient would benefit from social work services and resources to assist with placement arrangements for patient's son, suffering from Huntington's Disease.  CSW obtained two HIPAA compliant identifiers from patient, which included patient's name and date of birth. Patient admits that she is in need of group home and/or assisted living placement for her son, requesting available resources.  CSW provided patient with a list of ten reputable homes that are licensed, bonded, Therapist, sports, located in [place], [place], accepting individuals with physical and mental disabilities.  Information provided included address, phone number of female beds available and name of administrator at each facility.  Patient indicated that her daughter, [name] her son's payee and will  take full responsibility for viewing each of the homes before make a decision regarding appropriate placement arrangements for patient. Patient admitted that patient does not currently have a primary care physician.  CSW encouraged patient to consider referring her son to her own Primary Care Physician, Dr. Sherren Mocha McDiarmid.  Patient believed this to be an excellent idea, agreeing to contact the office today to see about scheduling patient an initial appointment.  Patient is aware that she will need to obtain a completed an signed FL-2 Form on patient, for placement purposes.  Patient will also need to have a TB Skin test administered, with a negative result for Tuberculosis before a bed offer is received. Last, CSW explained to patient that she will need to switch patient's Social Security Income from [facility] to [facility], through [employer], located in [place], [place].  In addition, patient will need to change patient's Adult Medicaid coverage from [facility] to [facility].  Patient reported that she will charge Mrs. Cleora Fleet with these tasks, agreeing to have Mrs. Cleora Fleet contact CSW directly if she has additional questions and/or needs assistance.  CSW voiced understanding and was agreeable to this plan. CSW will perform a case closure on patient, as all goals of treatment have been met from social work standpoint and no additional social work needs have been identified at this time.  CSW will notify patient's Telephonic RNCM with [facility], [name] of CSW's plans to close patient's case.  CSW will fax an update to patient's Primary Care Physician, Dr. Sherren Mocha McDiarmid to ensure that they are aware of CSW's involvement with patient's plan of care.  CSW will submit a case closure request to Josepha Pigg, Care Management Assistant with [facility], in  the form of an [employer].  CSW will ensure that Mrs. Quentin Cornwall is aware of Mrs. Pleasant's, RNCM with [facility], continued involvement with patient's care. Nat Christen, BSW, MSW, LCSW  Licensed Education officer, [facility]  Mailing [place] [address], [place], [place] [address] Physical Address-[address], [place], [place] [address] Toll Free Main # [phone] Fax # [phone] Cell # [phone]  Fax # [phone]  [email or web address]

## 2016-05-04 ENCOUNTER — Other Ambulatory Visit: Payer: Self-pay | Admitting: *Deleted

## 2016-05-04 NOTE — Patient Outreach (Addendum)
[facility]) Care Management  05/04/2016  Lauren [name] 1951-01-09 [id number]  RN Health Coach received telephone call from patient.  Hipaa compliance verified. Patient was calling to make Tool aware of how she was doing with new chemo. Per patient she has lost 5 pounds since starting the chemo. Patient stated that she has no appetite. She is not having episodes of nausea or vomiting . She stated she just doesn't feel like eating.  Plan: RN discussed the importance of eating. RN discussed with patient about drinking carnation instant breakfast since she likes that and mixing with ice cream to make milkshakes if she doesn't feel like eating. Patient agreed. RN told patient to increase her vegetables if possible. RN explained the importance of wrapping up in clothing since her WBC is decreased when she going out. RN discussed avoiding small children. Patient will keep Health Coach updated on condition. RN will follow up within the month of January Lauren [name] [facility] [phone]

## 2016-05-06 ENCOUNTER — Ambulatory Visit: Payer: Self-pay | Admitting: *Deleted

## 2016-05-13 ENCOUNTER — Ambulatory Visit: Payer: Self-pay | Admitting: *Deleted

## 2016-05-21 ENCOUNTER — Encounter: Payer: Self-pay | Admitting: Pharmacist

## 2016-05-22 ENCOUNTER — Ambulatory Visit: Payer: Commercial Managed Care - HMO | Admitting: Nutrition

## 2016-05-22 ENCOUNTER — Ambulatory Visit (HOSPITAL_BASED_OUTPATIENT_CLINIC_OR_DEPARTMENT_OTHER): Payer: Commercial Managed Care - HMO | Admitting: Hematology and Oncology

## 2016-05-22 ENCOUNTER — Telehealth: Payer: Self-pay | Admitting: Hematology and Oncology

## 2016-05-22 ENCOUNTER — Encounter: Payer: Self-pay | Admitting: Hematology and Oncology

## 2016-05-22 ENCOUNTER — Other Ambulatory Visit (HOSPITAL_BASED_OUTPATIENT_CLINIC_OR_DEPARTMENT_OTHER): Payer: Commercial Managed Care - HMO

## 2016-05-22 ENCOUNTER — Ambulatory Visit: Payer: Commercial Managed Care - HMO

## 2016-05-22 ENCOUNTER — Ambulatory Visit (HOSPITAL_BASED_OUTPATIENT_CLINIC_OR_DEPARTMENT_OTHER): Payer: Medicare HMO

## 2016-05-22 VITALS — BP 121/58 | HR 85 | Temp 97.7°F | Resp 18 | Ht 64.0 in | Wt 161.2 lb

## 2016-05-22 DIAGNOSIS — Z5111 Encounter for antineoplastic chemotherapy: Secondary | ICD-10-CM | POA: Diagnosis not present

## 2016-05-22 DIAGNOSIS — Z720 Tobacco use: Secondary | ICD-10-CM

## 2016-05-22 DIAGNOSIS — Z79899 Other long term (current) drug therapy: Secondary | ICD-10-CM | POA: Diagnosis not present

## 2016-05-22 DIAGNOSIS — G620 Drug-induced polyneuropathy: Secondary | ICD-10-CM | POA: Diagnosis not present

## 2016-05-22 DIAGNOSIS — Z5181 Encounter for therapeutic drug level monitoring: Secondary | ICD-10-CM

## 2016-05-22 DIAGNOSIS — C3412 Malignant neoplasm of upper lobe, left bronchus or lung: Secondary | ICD-10-CM

## 2016-05-22 DIAGNOSIS — E440 Moderate protein-calorie malnutrition: Secondary | ICD-10-CM

## 2016-05-22 DIAGNOSIS — C7931 Secondary malignant neoplasm of brain: Secondary | ICD-10-CM

## 2016-05-22 DIAGNOSIS — F17200 Nicotine dependence, unspecified, uncomplicated: Secondary | ICD-10-CM

## 2016-05-22 DIAGNOSIS — G893 Neoplasm related pain (acute) (chronic): Secondary | ICD-10-CM | POA: Insufficient documentation

## 2016-05-22 DIAGNOSIS — C7949 Secondary malignant neoplasm of other parts of nervous system: Principal | ICD-10-CM

## 2016-05-22 DIAGNOSIS — R634 Abnormal weight loss: Secondary | ICD-10-CM

## 2016-05-22 DIAGNOSIS — T451X5A Adverse effect of antineoplastic and immunosuppressive drugs, initial encounter: Principal | ICD-10-CM

## 2016-05-22 LAB — CBC WITH DIFFERENTIAL/PLATELET
BASO%: 0.2 % (ref 0.0–2.0)
BASOS ABS: 0 10*3/uL (ref 0.0–0.1)
EOS ABS: 0.1 10*3/uL (ref 0.0–0.5)
EOS%: 1.3 % (ref 0.0–7.0)
HCT: 32.1 % — ABNORMAL LOW (ref 34.8–46.6)
HGB: 10.2 g/dL — ABNORMAL LOW (ref 11.6–15.9)
LYMPH%: 16.7 % (ref 14.0–49.7)
MCH: 23.8 pg — AB (ref 25.1–34.0)
MCHC: 31.8 g/dL (ref 31.5–36.0)
MCV: 75 fL — AB (ref 79.5–101.0)
MONO#: 1.2 10*3/uL — ABNORMAL HIGH (ref 0.1–0.9)
MONO%: 21.5 % — ABNORMAL HIGH (ref 0.0–14.0)
NEUT#: 3.3 10*3/uL (ref 1.5–6.5)
NEUT%: 60.3 % (ref 38.4–76.8)
Platelets: 456 10*3/uL — ABNORMAL HIGH (ref 145–400)
RBC: 4.28 10*6/uL (ref 3.70–5.45)
RDW: 16.8 % — ABNORMAL HIGH (ref 11.2–14.5)
WBC: 5.4 10*3/uL (ref 3.9–10.3)
lymph#: 0.9 10*3/uL (ref 0.9–3.3)

## 2016-05-22 LAB — COMPREHENSIVE METABOLIC PANEL
ALT: 9 U/L (ref 0–55)
AST: 13 U/L (ref 5–34)
Albumin: 2.5 g/dL — ABNORMAL LOW (ref 3.5–5.0)
Alkaline Phosphatase: 110 U/L (ref 40–150)
Anion Gap: 8 mEq/L (ref 3–11)
BUN: 7.3 mg/dL (ref 7.0–26.0)
CHLORIDE: 104 meq/L (ref 98–109)
CO2: 27 meq/L (ref 22–29)
Calcium: 9.2 mg/dL (ref 8.4–10.4)
Creatinine: 1 mg/dL (ref 0.6–1.1)
EGFR: 65 mL/min/{1.73_m2} — ABNORMAL LOW (ref >90)
GLUCOSE: 92 mg/dL (ref 70–140)
POTASSIUM: 3.7 meq/L (ref 3.5–5.1)
SODIUM: 139 meq/L (ref 136–145)
Total Bilirubin: 0.53 mg/dL (ref 0.20–1.20)
Total Protein: 6.9 g/dL (ref 6.4–8.3)

## 2016-05-22 LAB — TSH: TSH: 1.414 m(IU)/L (ref 0.308–3.960)

## 2016-05-22 MED ORDER — HEPARIN SOD (PORK) LOCK FLUSH 100 UNIT/ML IV SOLN
500.0000 [IU] | Freq: Once | INTRAVENOUS | Status: AC | PRN
  Administered 2016-05-22: 500 [IU]
  Filled 2016-05-22: qty 5

## 2016-05-22 MED ORDER — SODIUM CHLORIDE 0.9 % IJ SOLN
10.0000 mL | INTRAMUSCULAR | Status: DC | PRN
  Administered 2016-05-22 (×2): 10 mL via INTRAVENOUS
  Filled 2016-05-22: qty 10

## 2016-05-22 MED ORDER — PALONOSETRON HCL INJECTION 0.25 MG/5ML
INTRAVENOUS | Status: AC
  Filled 2016-05-22: qty 5

## 2016-05-22 MED ORDER — SODIUM CHLORIDE 0.9 % IV SOLN
800.0000 mg/m2 | Freq: Once | INTRAVENOUS | Status: AC
  Administered 2016-05-22: 1482 mg via INTRAVENOUS
  Filled 2016-05-22: qty 38.98

## 2016-05-22 MED ORDER — HYDROMORPHONE HCL 2 MG PO TABS: 2.0000 mg | ORAL_TABLET | Freq: Four times a day (QID) | ORAL | 0 refills | Status: DC | PRN

## 2016-05-22 MED ORDER — PALONOSETRON HCL INJECTION 0.25 MG/5ML
0.2500 mg | Freq: Once | INTRAVENOUS | Status: AC
  Administered 2016-05-22: 0.25 mg via INTRAVENOUS

## 2016-05-22 MED ORDER — DEXAMETHASONE SODIUM PHOSPHATE 10 MG/ML IJ SOLN
10.0000 mg | Freq: Once | INTRAMUSCULAR | Status: AC
  Administered 2016-05-22: 10 mg via INTRAVENOUS

## 2016-05-22 MED ORDER — DEXAMETHASONE SODIUM PHOSPHATE 10 MG/ML IJ SOLN
INTRAMUSCULAR | Status: AC
  Filled 2016-05-22: qty 1

## 2016-05-22 MED ORDER — CARBOPLATIN CHEMO INTRADERMAL TEST DOSE 100MCG/0.02ML
100.0000 ug | Freq: Once | INTRADERMAL | Status: AC
  Administered 2016-05-22: 100 ug via INTRADERMAL
  Filled 2016-05-22: qty 0.02

## 2016-05-22 MED ORDER — SODIUM CHLORIDE 0.9 % IV SOLN
Freq: Once | INTRAVENOUS | Status: AC
  Administered 2016-05-22: 15:00:00 via INTRAVENOUS

## 2016-05-22 MED ORDER — SODIUM CHLORIDE 0.9 % IV SOLN
168.0000 mg | Freq: Once | INTRAVENOUS | Status: AC
  Administered 2016-05-22: 170 mg via INTRAVENOUS
  Filled 2016-05-22: qty 17

## 2016-05-22 MED ORDER — SODIUM CHLORIDE 0.9% FLUSH
10.0000 mL | INTRAVENOUS | Status: DC | PRN
  Administered 2016-05-22: 10 mL
  Filled 2016-05-22: qty 10

## 2016-05-22 MED FILL — HYDROmorphone HCL 2 MG TABS: 2 | 7 days supply | Qty: 30 | Fill #0

## 2016-05-22 NOTE — Patient Instructions (Signed)
[facility] Discharge Instructions for Patients Receiving Chemotherapy  Today you received the following chemotherapy agents: Gemzar and Carboplatin   To help prevent nausea and vomiting after your treatment, we encourage you to take your nausea medication as directed.    If you develop nausea and vomiting that is not controlled by your nausea medication, call the clinic.   BELOW ARE SYMPTOMS THAT SHOULD BE REPORTED IMMEDIATELY:  *FEVER GREATER THAN 100.5 F  *CHILLS WITH OR WITHOUT FEVER  NAUSEA AND VOMITING THAT IS NOT CONTROLLED WITH YOUR NAUSEA MEDICATION  *UNUSUAL SHORTNESS OF BREATH  *UNUSUAL BRUISING OR BLEEDING  TENDERNESS IN MOUTH AND THROAT WITH OR WITHOUT PRESENCE OF ULCERS  *URINARY PROBLEMS  *BOWEL PROBLEMS  UNUSUAL RASH Items with * indicate a potential emergency and should be followed up as soon as possible.  Feel free to call the clinic you have any questions or concerns. The clinic phone number is [phone].  Please show the CHEMO ALERT CARD at check-in to the Emergency Department and triage nurse.   

## 2016-05-22 NOTE — Telephone Encounter (Signed)
Appointments scheduled per 1/4 LOS. Patient given AVS report and calendars with future scheduled appointments. °

## 2016-05-22 NOTE — Progress Notes (Signed)
Lauren [name] OFFICE PROGRESS NOTE  Patient Care Team: [name], MD as PCP - General (Family Medicine) Gaye Pollack, MD (Cardiothoracic Surgery) Dickie La, MD (Family Medicine) Heath Lark, MD as Consulting Physician (Hematology and Oncology) Verlin Grills, RN as [facility] Leighton Ruff, [place] (Optometry) Milus Banister, MD as Consulting Physician (Gastroenterology) Melida Quitter, MD as Consulting Physician (Otolaryngology)  SUMMARY OF ONCOLOGIC HISTORY: Oncology History   Colon cancer   Primary site: Colon and Rectum (Left)   Staging method: AJCC 7th Edition   Clinical: Stage I (T2, N0, M0) signed by Heath Lark, MD on 05/31/2013  2:39 PM   Pathologic: Stage I (T2, N0, cM0) signed by Heath Lark, MD on 05/31/2013  2:39 PM   Summary: Stage I (T2, N0, cM0) Lung cancer, EGFR/ALK negative, recurrence after initial resection to LN and brain   Primary site: Lung (Left)   Staging method: AJCC 7th Edition   Clinical: Stage IV (T1, N2, M1b) signed by Heath Lark, MD on 05/31/2013  2:26 PM   Pathologic: Stage IV (T1, N2, M1b) signed by Heath Lark, MD on 05/31/2013  2:26 PM   Summary: Stage IV (T1, N2, M1b)       Cancer of upper lobe of left lung, Adenocarcinoma   03/01/2007 Procedure    Colonoscopy revealed abnormalities and biopsy show high-grade dysplasia      04/01/2007 Surgery    She underwent sigmoid resection which showed T2 N0 colon cancer, and negative margins and all of 17 lymph nodes were negative      02/29/2008 Procedure    Repeat surveillance colonoscopy was negative.      06/16/2010 Surgery    She underwent left upper lobectomy we show well-differentiated adenocarcinoma of the lung, T1, N0, M0      03/18/2011 Procedure    Repeat colonoscopy show multiple polyps but there were benign      10/19/2011 Procedure    Biopsy of mediastinal lymph node came back positive for recurrence of lung cancer, EGFR and ALK  negative      11/16/2011 - 12/14/2011 Chemotherapy    She received concurrent chemoradiation therapy with weekly carboplatin and Taxol.      11/16/2011 - 12/29/2011 Radiation Therapy    She received radiation therapy with weekly chemotherapy      02/15/2012 Imaging    MR of the brain showed a new intracranial metastases. This was subsequently treated with stereotactic radiosurgery.      03/07/2012 - 04/12/2013 Chemotherapy    She received chemotherapy with maintainence Alimta every 3 weeks. Chemotherapy was discontinued due to profound fatigue      03/02/2013 Procedure    She had therapeutic ultrasound guidance thoracentesis for pleural effusion that came back negative for cancer      05/04/2013 Procedure    She had repeat ultrasound-guided thoracentesis again and cytology was negative      05/29/2013 Imaging    Repeat CT scan of the chest, abdomen and pelvis show no evidence of disease but persistent right-sided pleural effusion      06/02/2013 Surgery    The patient had placement of Pleurx catheter and subsequently underwent pleurodesis.      09/22/2013 Imaging    Repeat CT scan show no evidence of active disease. There are nonspecific lymphadenopathy and she is placed on observation.      03/23/2014 Imaging    Repeat CT scan of the chest, abdomen and pelvis show recurrence of cancer  with widespread bilateral pulmonary metastasis.      05/14/2014 Imaging    Imaging of the chest and brain were repeated due to delay of initiation of chemotherapy. Overall, chest CT scan show stable disease. MRI of the head was negative for recurrence      05/16/2014 - 07/18/2014 Chemotherapy     she completed 4 cycles of combination chemotherapy with carboplatin and Alimta      07/16/2014 Imaging     repeat CT scan of the chest, abdomen and pelvis show regression in the size of lung nodules.      08/29/2014 - 08/14/2015 Chemotherapy    She received maintenance Alimta      10/16/2014 Imaging      repeat CT scan of the chest, abdomen and pelvis show regression in the size of lung nodules.      01/29/2015 Imaging    Repeat CT scan showed stable disease      02/01/2015 Imaging    MRI brain is negative      04/30/2015 Imaging    CT scan of the chest, abdomen and pelvis showed stable disease      07/25/2015 Imaging    MRI brain showed no evidence of new disease      09/09/2015 Imaging    CT chest showed interval increase in size of large RIGHT upper lobe nodule is most consistent with lung cancer recurrence.      09/18/2015 - 03/26/2016 Chemotherapy    She started on Nivolumab      10/22/2015 Imaging    Screening mammogram showed mild abnormality      10/31/2015 Imaging    Diagnostic mammogram showed mild calcification at the right upper outer breast      11/27/2015 Imaging    Stable post treatment related changes of left lower lobectomy and radiation therapy redemonstrated, as above. No definite findings to suggest local recurrence of disease or metastatic disease on today's examination      01/24/2016 Imaging    MR brain showed continued stable appearance of two small treated brain metastases. No new or progressive metastatic disease to the brain.      04/15/2016 Imaging    Ct scan showed findings highly suspicious for recurrent tumor in the mediastinum surrounding the right mainstem bronchus and in the region of the azygoesophageal recess. Diffuse pulmonary metastatic disease with new and progressive nodules since the prior examination.      04/24/2016 -  Chemotherapy    The patient had chemotherapy with gemzar and Carboplatin      04/30/2016 Imaging    Decreased contrast enhancement of the two treated metastases in the right frontal and left occipital lobes. No new lesions.       Brain metastases treated with radiosurgery   02/26/2012 Initial Diagnosis    Brain metastases treated with radiosurgery       INTERVAL HISTORY: Please see below for problem oriented  charting. She is seen at cycle 2 of chemo She felt weak, with poor appetite and recent weight loss She has diffuse bone pain and persistent neuropathy Denies recent nausea or constipation  REVIEW OF SYSTEMS:   Constitutional: Denies fevers, chills  Eyes: Denies blurriness of vision Ears, nose, mouth, throat, and face: Denies mucositis or sore throat Respiratory: Denies cough, dyspnea or wheezes Cardiovascular: Denies palpitation, chest discomfort or lower extremity swelling Gastrointestinal:  Denies nausea, heartburn or change in bowel habits Skin: Denies abnormal skin rashes Lymphatics: Denies new lymphadenopathy or easy bruising Neurological:Denies numbness, tingling or  new weaknesses Behavioral/Psych: Mood is stable, no new changes  All other systems were reviewed with the patient and are negative.  I have reviewed the past medical history, past surgical history, social history and family history with the patient and they are unchanged from previous note.  ALLERGIES:  is allergic to adhesive [tape] and lisinopril.  MEDICATIONS:  Current Outpatient Prescriptions  Medication Sig Dispense Refill  . aspirin 81 MG chewable tablet Chew 81 mg by mouth daily.    Marland Kitchen BYSTOLIC 10 MG tablet TAKE 1 TABLET (10 MG TOTAL) BY MOUTH DAILY. 90 tablet 3  . CAPSAICIN EX Apply topically as directed. To feet     . cholecalciferol (VITAMIN D) 1000 units tablet Take 1,000 Units by mouth daily.    Marland Kitchen COLACE 100 MG capsule Take 100 mg by mouth daily.    . diclofenac sodium (VOLTAREN) 1 % GEL Apply 2 g topically 4 (four) times daily. As needed for pain 100 g 3  . gabapentin (NEURONTIN) 300 MG capsule Take 1 capsule (300 mg total) by mouth 3 (three) times daily. (Patient taking differently: Take 300 mg by mouth daily. ) 90 capsule 1  . lidocaine-prilocaine (EMLA) cream Apply topically as needed. Apply to porta cath site one hour prior to needle stick. 30 g 6  . omeprazole (PRILOSEC) 40 MG capsule Take 40 mg by  mouth daily.    . potassium chloride SA (K-DUR,KLOR-CON) 20 MEQ tablet Take 20 mEq by mouth 2 (two) times daily.    . pravastatin (PRAVACHOL) 40 MG tablet Take 40 mg by mouth daily.    Marland Kitchen tiZANidine (ZANAFLEX) 2 MG tablet Take 1 tablet (2 mg total) by mouth every 6 (six) hours as needed (Back Muscle Spasm). 30 tablet 3  . traMADol (ULTRAM) 50 MG tablet Take 1 tablet (50 mg total) by mouth every 6 (six) hours as needed for moderate pain or severe pain. 90 tablet 1  . umeclidinium-vilanterol (ANORO ELLIPTA) 62.5-25 MCG/INH AEPB Inhale 1 puff into the lungs daily. 1 each 0  . albuterol (PROVENTIL HFA;VENTOLIN HFA) 108 (90 Base) MCG/ACT inhaler Inhale 1 puff into the lungs 2 (two) times daily. (Patient not taking: Reported on 05/22/2016) 6.7 g 6  . HYDROcodone-homatropine (HYCODAN) 5-1.5 MG/5ML syrup Take 5 mLs by mouth every 6 (six) hours as needed for cough. (Patient not taking: Reported on 05/22/2016) 240 mL 0  . HYDROmorphone (DILAUDID) 2 MG tablet Take 1 tablet (2 mg total) by mouth every 6 (six) hours as needed for severe pain. 30 tablet 0  . nitroGLYCERIN (NITROSTAT) 0.4 MG SL tablet Place 1 tablet (0.4 mg total) under the tongue every 5 (five) minutes as needed. For chest pain (Patient not taking: Reported on 05/22/2016) 25 tablet 5  . ondansetron (ZOFRAN ODT) 4 MG disintegrating tablet Take 1 tablet (4 mg total) by mouth every 8 (eight) hours as needed for nausea or vomiting. (Patient not taking: Reported on 05/22/2016) 20 tablet 0   No current facility-administered medications for this visit.    Facility-Administered Medications Ordered in Other Visits  Medication Dose Route Frequency Provider Last Rate Last Dose  . sodium chloride 0.9 % injection 10 mL  10 mL Intravenous PRN Heath Lark, MD   10 mL at 09/18/15 1345  . sodium chloride 0.9 % injection 10 mL  10 mL Intravenous PRN Heath Lark, MD   10 mL at 04/24/16 1136    PHYSICAL EXAMINATION: ECOG PERFORMANCE STATUS: 2 - Symptomatic, <50% confined  to bed  Vitals:  05/22/16 1213  BP: (!) 121/58  Pulse: 85  Resp: 18  Temp: 97.7 F (36.5 C)   Filed Weights   05/22/16 1213  Weight: 161 lb 3.2 oz (73.1 kg)    GENERAL:alert, no distress and comfortable SKIN: skin color, texture, turgor are normal, no rashes or significant lesions EYES: normal, Conjunctiva are pink and non-injected, sclera clear OROPHARYNX:no exudate, no erythema and lips, buccal mucosa, and tongue normal  NECK: supple, thyroid normal size, non-tender, without nodularity LYMPH:  no palpable lymphadenopathy in the cervical, axillary or inguinal LUNGS: clear to auscultation and percussion with normal breathing effort HEART: regular rate & rhythm and no murmurs and no lower extremity edema ABDOMEN:abdomen soft, non-tender and normal bowel sounds Musculoskeletal:no cyanosis of digits and no clubbing  NEURO: alert & oriented x 3 with fluent speech, no focal motor/sensory deficits  LABORATORY DATA:  I have reviewed the data as listed    Component Value Date/Time   NA 139 05/22/2016 1142   K 3.7 05/22/2016 1142   CL 106 12/26/2013 0946   CL 102 10/24/2012 1001   CO2 27 05/22/2016 1142   GLUCOSE 92 05/22/2016 1142   GLUCOSE 111 (H) 10/24/2012 1001   BUN 7.3 05/22/2016 1142   CREATININE 1.0 05/22/2016 1142   CALCIUM 9.2 05/22/2016 1142   PROT 6.9 05/22/2016 1142   ALBUMIN 2.5 (L) 05/22/2016 1142   AST 13 05/22/2016 1142   ALT 9 05/22/2016 1142   ALKPHOS 110 05/22/2016 1142   BILITOT 0.53 05/22/2016 1142   GFRNONAA 74 (L) 06/12/2013 0625   GFRAA 86 (L) 06/12/2013 0625    No results found for: SPEP, UPEP  Lab Results  Component Value Date   WBC 5.4 05/22/2016   NEUTROABS 3.3 05/22/2016   HGB 10.2 (L) 05/22/2016   HCT 32.1 (L) 05/22/2016   MCV 75.0 (L) 05/22/2016   PLT 456 (H) 05/22/2016      Chemistry      Component Value Date/Time   NA 139 05/22/2016 1142   K 3.7 05/22/2016 1142   CL 106 12/26/2013 0946   CL 102 10/24/2012 1001   CO2 27  05/22/2016 1142   BUN 7.3 05/22/2016 1142   CREATININE 1.0 05/22/2016 1142      Component Value Date/Time   CALCIUM 9.2 05/22/2016 1142   ALKPHOS 110 05/22/2016 1142   AST 13 05/22/2016 1142   ALT 9 05/22/2016 1142   BILITOT 0.53 05/22/2016 1142       RADIOGRAPHIC STUDIES: I have personally reviewed the radiological images as listed and agreed with the findings in the report. Mr Jeri Cos Wo Contrast  Result Date: 04/30/2016 CLINICAL DATA:  Metastatic lung cancer. EXAM: MRI HEAD WITHOUT AND WITH CONTRAST TECHNIQUE: Multiplanar, multiecho pulse sequences of the brain and surrounding structures were obtained without and with intravenous contrast. CONTRAST:  59m MULTIHANCE GADOBENATE DIMEGLUMINE 529 MG/ML IV SOLN COMPARISON:  Brain MRI 01/24/2016 FINDINGS: Brain: The midline structures are normal. Punctate enhancing foci in the right frontal white matter and along the left occipital cortex (series 11, image 33) are slightly less conspicuous than on the prior examination, possibly owing to technical factors. No acute infarct or intraparenchymal hemorrhage. There is multifocal hyperintense T2-weighted signal within the periventricular white matter, most often seen in the setting of chronic microvascular ischemia. No mass lesion or midline shift. No hydrocephalus or extra-axial fluid collection. No age advanced or lobar predominant atrophy. Unchanged arachnoid cyst at the vertex. Vascular: Major intracranial arterial and venous sinus flow voids  are preserved. No evidence of chronic microhemorrhage or amyloid angiopathy. Skull and upper cervical spine: The visualized skull base, calvarium, upper cervical spine and extracranial soft tissues are normal. Sinuses/Orbits: No fluid levels or advanced mucosal thickening. No mastoid effusion. Normal orbits. IMPRESSION: Decreased contrast enhancement of the two treated metastases in the right frontal and left occipital lobes. No new lesions. Electronically Signed    By: Ulyses Jarred M.D.   On: 04/30/2016 19:57    ASSESSMENT & PLAN:  Cancer of upper lobe of left lung, Adenocarcinoma She complained of profound fatigue, loss of appetite and 3 pound weight loss since chemotherapy last week. She is noted to be mildly anemic I plan to continue reduced dose chemo I plan to repeat CT after 3 cycles of chemo  Protein calorie malnutrition ([place]) She has lost some weight due to reduced appetite after recent treatment. We discussed importance of additional nutritional supplement. I will consult dietitian while she is here. She will continue to increase oral intake as tolerated.  TOBACCO ABUSE I spent some time counseling the patient the importance of tobacco cessation. She is currently not interested to quit now.  Cancer associated pain She has diffuse bone pain after each treatment I recommend trial of Dilaudid because tramadol is not controlling her pain I warn her potential side-effects of narcotics and discussed narcotics refill policy   No orders of the defined types were placed in this encounter.  All questions were answered. The patient knows to call the clinic with any problems, questions or concerns. No barriers to learning was detected. I spent 20 minutes counseling the patient face to face. The total time spent in the appointment was 30 minutesand more than 50% was on counseling and review of test results     Heath Lark, MD 05/22/2016 10:19 PM

## 2016-05-22 NOTE — Assessment & Plan Note (Signed)
She has lost some weight due to reduced appetite after recent treatment. We discussed importance of additional nutritional supplement. I will consult dietitian while she is here. She will continue to increase oral intake as tolerated.

## 2016-05-22 NOTE — Assessment & Plan Note (Signed)
She complained of profound fatigue, loss of appetite and 3 pound weight loss since chemotherapy last week. She is noted to be mildly anemic I plan to continue reduced dose chemo I plan to repeat CT after 3 cycles of chemo

## 2016-05-22 NOTE — Progress Notes (Signed)
Nutrition follow-up completed with patient being treated for lung cancer. Patient is familiar from previous treatments. Current weight documented as 160.2 pounds January 5, down from 170 pounds October 2017. Patient reports she has poor appetite and early satiety. She tends to consume oral nutrition supplements easier than solid food.  Nutrition diagnosis: Unintended weight loss related to inadequate oral intake as evidenced by 10 pound weight loss over 3 months.  Intervention: Patient has good understanding of high-calorie, high-protein foods. Educated patient to snack on high-calorie, high-protein choices throughout the day and try to consume four oral nutrition supplements daily. Provided coupons. Questions were answered.  Teach back method used.  Monitoring, evaluation, goals: Patient will work to increase calories and protein to minimize weight loss.  Next visit: Patient to contact me for questions or concerns.  **Disclaimer: This note was dictated with voice recognition software. Similar sounding words can inadvertently be transcribed and this note may contain transcription errors which may not have been corrected upon publication of note.**

## 2016-05-22 NOTE — Assessment & Plan Note (Signed)
She has diffuse bone pain after each treatment I recommend trial of Dilaudid because tramadol is not controlling her pain I warn her potential side-effects of narcotics and discussed narcotics refill policy

## 2016-05-22 NOTE — Assessment & Plan Note (Signed)
I spent some time counseling the patient the importance of tobacco cessation. She is currently not interested to quit now. 

## 2016-05-23 ENCOUNTER — Telehealth: Payer: Self-pay | Admitting: *Deleted

## 2016-05-23 NOTE — Telephone Encounter (Signed)
Per 1/5 LOS I have scheduled appts and notified the scheduler

## 2016-05-29 ENCOUNTER — Other Ambulatory Visit (HOSPITAL_BASED_OUTPATIENT_CLINIC_OR_DEPARTMENT_OTHER): Payer: Commercial Managed Care - HMO

## 2016-05-29 ENCOUNTER — Ambulatory Visit (HOSPITAL_BASED_OUTPATIENT_CLINIC_OR_DEPARTMENT_OTHER): Payer: Commercial Managed Care - HMO

## 2016-05-29 ENCOUNTER — Ambulatory Visit (HOSPITAL_BASED_OUTPATIENT_CLINIC_OR_DEPARTMENT_OTHER): Payer: Medicare HMO

## 2016-05-29 VITALS — BP 96/58 | HR 55 | Temp 98.3°F | Resp 18

## 2016-05-29 DIAGNOSIS — Z5111 Encounter for antineoplastic chemotherapy: Secondary | ICD-10-CM

## 2016-05-29 DIAGNOSIS — C7931 Secondary malignant neoplasm of brain: Secondary | ICD-10-CM

## 2016-05-29 DIAGNOSIS — C3412 Malignant neoplasm of upper lobe, left bronchus or lung: Secondary | ICD-10-CM

## 2016-05-29 DIAGNOSIS — Z5181 Encounter for therapeutic drug level monitoring: Secondary | ICD-10-CM

## 2016-05-29 DIAGNOSIS — C7949 Secondary malignant neoplasm of other parts of nervous system: Principal | ICD-10-CM

## 2016-05-29 DIAGNOSIS — Z79899 Other long term (current) drug therapy: Secondary | ICD-10-CM | POA: Diagnosis not present

## 2016-05-29 LAB — COMPREHENSIVE METABOLIC PANEL
ALBUMIN: 2.7 g/dL — AB (ref 3.5–5.0)
ALK PHOS: 111 U/L (ref 40–150)
ALT: 10 U/L (ref 0–55)
AST: 12 U/L (ref 5–34)
Anion Gap: 7 mEq/L (ref 3–11)
BUN: 11.5 mg/dL (ref 7.0–26.0)
CALCIUM: 9.1 mg/dL (ref 8.4–10.4)
CHLORIDE: 108 meq/L (ref 98–109)
CO2: 24 mEq/L (ref 22–29)
Creatinine: 1.1 mg/dL (ref 0.6–1.1)
EGFR: 61 mL/min/{1.73_m2} — AB (ref >90)
Glucose: 92 mg/dl (ref 70–140)
POTASSIUM: 4.3 meq/L (ref 3.5–5.1)
SODIUM: 140 meq/L (ref 136–145)
Total Bilirubin: 0.33 mg/dL (ref 0.20–1.20)
Total Protein: 7.1 g/dL (ref 6.4–8.3)

## 2016-05-29 LAB — CBC WITH DIFFERENTIAL/PLATELET
BASO%: 1.1 % (ref 0.0–2.0)
BASOS ABS: 0 10*3/uL (ref 0.0–0.1)
EOS%: 0.2 % (ref 0.0–7.0)
Eosinophils Absolute: 0 10*3/uL (ref 0.0–0.5)
HCT: 32.5 % — ABNORMAL LOW (ref 34.8–46.6)
HEMOGLOBIN: 10.3 g/dL — AB (ref 11.6–15.9)
LYMPH%: 31.5 % (ref 14.0–49.7)
MCH: 23.7 pg — AB (ref 25.1–34.0)
MCHC: 31.7 g/dL (ref 31.5–36.0)
MCV: 74.7 fL — ABNORMAL LOW (ref 79.5–101.0)
MONO#: 0.5 10*3/uL (ref 0.1–0.9)
MONO%: 16.7 % — AB (ref 0.0–14.0)
NEUT#: 1.5 10*3/uL (ref 1.5–6.5)
NEUT%: 50.5 % (ref 38.4–76.8)
Platelets: 289 10*3/uL (ref 145–400)
RBC: 4.35 10*6/uL (ref 3.70–5.45)
RDW: 16.8 % — AB (ref 11.2–14.5)
WBC: 3 10*3/uL — ABNORMAL LOW (ref 3.9–10.3)
lymph#: 1 10*3/uL (ref 0.9–3.3)

## 2016-05-29 LAB — TSH: TSH: 1.035 m[IU]/L (ref 0.308–3.960)

## 2016-05-29 MED ORDER — SODIUM CHLORIDE 0.9 % IJ SOLN
10.0000 mL | INTRAMUSCULAR | Status: DC | PRN
  Administered 2016-05-29: 10 mL via INTRAVENOUS
  Filled 2016-05-29: qty 10

## 2016-05-29 MED ORDER — CARBOPLATIN CHEMO INTRADERMAL TEST DOSE 100MCG/0.02ML
100.0000 ug | Freq: Once | INTRADERMAL | Status: AC
  Administered 2016-05-29: 100 ug via INTRADERMAL
  Filled 2016-05-29: qty 0.02

## 2016-05-29 MED ORDER — SODIUM CHLORIDE 0.9 % IV SOLN
168.2000 mg | Freq: Once | INTRAVENOUS | Status: AC
  Administered 2016-05-29: 170 mg via INTRAVENOUS
  Filled 2016-05-29: qty 17

## 2016-05-29 MED ORDER — HEPARIN SOD (PORK) LOCK FLUSH 100 UNIT/ML IV SOLN
500.0000 [IU] | Freq: Once | INTRAVENOUS | Status: AC | PRN
  Administered 2016-05-29: 500 [IU] via INTRAVENOUS
  Filled 2016-05-29: qty 5

## 2016-05-29 MED ORDER — SODIUM CHLORIDE 0.9% FLUSH
10.0000 mL | INTRAVENOUS | Status: DC | PRN
  Filled 2016-05-29: qty 10

## 2016-05-29 MED ORDER — PALONOSETRON HCL INJECTION 0.25 MG/5ML
INTRAVENOUS | Status: AC
  Filled 2016-05-29: qty 5

## 2016-05-29 MED ORDER — SODIUM CHLORIDE 0.9 % IV SOLN
Freq: Once | INTRAVENOUS | Status: AC
Start: 2016-05-29 — End: 2016-05-29
  Administered 2016-05-29: 13:00:00 via INTRAVENOUS

## 2016-05-29 MED ORDER — SODIUM CHLORIDE 0.9 % IV SOLN
800.0000 mg/m2 | Freq: Once | INTRAVENOUS | Status: AC
  Administered 2016-05-29: 1482 mg via INTRAVENOUS
  Filled 2016-05-29: qty 38.98

## 2016-05-29 MED ORDER — DEXAMETHASONE SODIUM PHOSPHATE 10 MG/ML IJ SOLN
10.0000 mg | Freq: Once | INTRAMUSCULAR | Status: AC
  Administered 2016-05-29: 10 mg via INTRAVENOUS

## 2016-05-29 MED ORDER — HEPARIN SOD (PORK) LOCK FLUSH 100 UNIT/ML IV SOLN
500.0000 [IU] | Freq: Once | INTRAVENOUS | Status: DC | PRN
Start: 2016-05-29 — End: 2016-05-29
  Filled 2016-05-29: qty 5

## 2016-05-29 MED ORDER — DEXAMETHASONE SODIUM PHOSPHATE 10 MG/ML IJ SOLN
INTRAMUSCULAR | Status: AC
  Filled 2016-05-29: qty 1

## 2016-05-29 MED ORDER — PALONOSETRON HCL INJECTION 0.25 MG/5ML
0.2500 mg | Freq: Once | INTRAVENOUS | Status: AC
  Administered 2016-05-29: 0.25 mg via INTRAVENOUS

## 2016-05-29 NOTE — Patient Instructions (Signed)
[facility] Discharge Instructions for Patients Receiving Chemotherapy  Today you received the following chemotherapy agents: Gemzar and Carboplatin   To help prevent nausea and vomiting after your treatment, we encourage you to take your nausea medication as directed.    If you develop nausea and vomiting that is not controlled by your nausea medication, call the clinic.   BELOW ARE SYMPTOMS THAT SHOULD BE REPORTED IMMEDIATELY:  *FEVER GREATER THAN 100.5 F  *CHILLS WITH OR WITHOUT FEVER  NAUSEA AND VOMITING THAT IS NOT CONTROLLED WITH YOUR NAUSEA MEDICATION  *UNUSUAL SHORTNESS OF BREATH  *UNUSUAL BRUISING OR BLEEDING  TENDERNESS IN MOUTH AND THROAT WITH OR WITHOUT PRESENCE OF ULCERS  *URINARY PROBLEMS  *BOWEL PROBLEMS  UNUSUAL RASH Items with * indicate a potential emergency and should be followed up as soon as possible.  Feel free to call the clinic you have any questions or concerns. The clinic phone number is [phone].  Please show the CHEMO ALERT CARD at check-in to the Emergency Department and triage nurse.   

## 2016-06-01 ENCOUNTER — Encounter: Payer: Self-pay | Admitting: Pulmonary Disease

## 2016-06-01 ENCOUNTER — Ambulatory Visit (INDEPENDENT_AMBULATORY_CARE_PROVIDER_SITE_OTHER): Payer: Medicare HMO | Admitting: Pulmonary Disease

## 2016-06-01 ENCOUNTER — Ambulatory Visit (INDEPENDENT_AMBULATORY_CARE_PROVIDER_SITE_OTHER)
Admission: RE | Admit: 2016-06-01 | Discharge: 2016-06-01 | Disposition: A | Discharge status: Home / Self Care | Payer: Commercial Managed Care - HMO | Source: Ambulatory Visit | Attending: Pulmonary Disease | Admitting: Pulmonary Disease

## 2016-06-01 VITALS — BP 120/70 | HR 77 | Temp 98.1°F | Ht 64.0 in | Wt 159.5 lb

## 2016-06-01 DIAGNOSIS — R0602 Shortness of breath: Secondary | ICD-10-CM | POA: Diagnosis not present

## 2016-06-01 DIAGNOSIS — E440 Moderate protein-calorie malnutrition: Secondary | ICD-10-CM | POA: Diagnosis not present

## 2016-06-01 DIAGNOSIS — F17200 Nicotine dependence, unspecified, uncomplicated: Secondary | ICD-10-CM

## 2016-06-01 DIAGNOSIS — C3412 Malignant neoplasm of upper lobe, left bronchus or lung: Secondary | ICD-10-CM | POA: Diagnosis not present

## 2016-06-01 DIAGNOSIS — C7949 Secondary malignant neoplasm of other parts of nervous system: Secondary | ICD-10-CM | POA: Diagnosis not present

## 2016-06-01 DIAGNOSIS — C7931 Secondary malignant neoplasm of brain: Secondary | ICD-10-CM

## 2016-06-01 DIAGNOSIS — J449 Chronic obstructive pulmonary disease, unspecified: Secondary | ICD-10-CM

## 2016-06-01 DIAGNOSIS — R05 Cough: Secondary | ICD-10-CM | POA: Diagnosis not present

## 2016-06-01 MED ORDER — UMECLIDINIUM-VILANTEROL 62.5-25 MCG/INH IN AEPB: 1.0000 | INHALATION_SPRAY | Freq: Every day | RESPIRATORY_TRACT | 3 refills | Status: DC

## 2016-06-01 MED ORDER — UMECLIDINIUM-VILANTEROL 62.5-25 MCG/INH IN AEPB
1.0000 | INHALATION_SPRAY | Freq: Every day | RESPIRATORY_TRACT | 0 refills | Status: DC
Start: 2016-06-01 — End: 2016-06-09

## 2016-06-01 NOTE — Patient Instructions (Signed)
Today we updated your med list in our EPIC system...    Continue your current medications the same...  Continue the ANORO one puff daily...  Continue to work on smoking cessation....  Today we checked a follow up CXR...    We will contact you w/ the results when available...   Call for any questions...  Let's plan a follow up visit in 12mo sooner if needed for breathing problems..Marland KitchenMarland Kitchen

## 2016-06-01 NOTE — Progress Notes (Signed)
Subjective:     Patient ID: Lauren [name], female   DOB: 02/20/1951, 66 y.o.   MRN: [id number]  HPI  Cancer of upper lobe of left lung, Adenocarcinoma   03/01/2007 Procedure Colonoscopy revealed abnormalities and biopsy show high-grade dysplasia   04/01/2007 Surgery She underwent sigmoid resection which showed T2 N0 colon cancer, and negative margins and all of 17 lymph nodes were negative   02/29/2008 Procedure Repeat surveillance colonoscopy was negative.   06/16/2010 Surgery She underwent left upper lobectomy we show well-differentiated adenocarcinoma of the lung, T1, N0, M0   03/18/2011 Procedure Repeat colonoscopy show multiple polyps but there were benign   10/19/2011 Procedure Biopsy of mediastinal lymph node came back positive for recurrence of lung cancer, EGFR and ALK negative   11/16/2011 - 12/14/2011 Chemotherapy She received concurrent chemoradiation therapy with weekly carboplatin and Taxol.   11/16/2011 - 12/29/2011 Radiation Therapy She received radiation therapy with weekly chemotherapy   02/15/2012 Imaging MR of the brain showed a new intracranial metastases. This was subsequently treated with stereotactic radiosurgery.   03/07/2012 - 04/12/2013 Chemotherapy She received chemotherapy with maintainence Alimta every 3 weeks. Chemotherapy was discontinued due to profound fatigue   03/02/2013 Procedure She had therapeutic ultrasound guidance thoracentesis for pleural effusion that came back negative for cancer   05/04/2013 Procedure She had repeat ultrasound-guided thoracentesis again and cytology was negative   05/29/2013 Imaging Repeat CT scan of the chest, abdomen and pelvis show no evidence of disease but persistent right-sided pleural effusion   06/02/2013 Surgery The patient had placement of Pleurx catheter and subsequently underwent pleurodesis.   09/22/2013 Imaging Repeat CT scan show no evidence of active disease. There are  nonspecific lymphadenopathy and she is placed on observation.   03/23/2014 Imaging Repeat CT scan of the chest, abdomen and pelvis show recurrence of cancer with widespread bilateral pulmonary metastasis.   05/14/2014 Imaging Imaging of the chest and brain were repeated due to delay of initiation of chemotherapy. Overall, chest CT scan show stable disease. MRI of the head was negative for recurrence   05/16/2014 - 07/18/2014 Chemotherapy she completed 4 cycles of combination chemotherapy with carboplatin and Alimta   07/16/2014 Imaging repeat CT scan of the chest, abdomen and pelvis show regression in the size of lung nodules.   08/29/2014 - 08/14/2015 Chemotherapy She received maintenance Alimta   10/16/2014 Imaging repeat CT scan of the chest, abdomen and pelvis show regression in the size of lung nodules.   01/29/2015 Imaging Repeat CT scan showed stable disease   02/01/2015 Imaging MRI brain is negative   04/30/2015 Imaging CT scan of the chest, abdomen and pelvis showed stable disease   07/25/2015 Imaging MRI brain showed no evidence of new disease   09/09/2015 Imaging CT chest showed interval increase in size of large RIGHT upper lobe nodule is most consistent with lung cancer recurrence.   09/18/2015 -  Chemotherapy She started on Nivolumab   10/22/2015 Imaging Screening mammogram showed mild abnormality   10/31/2015 Imaging Diagnostic mammogram showed mild calcification at the right upper outer breast   11/27/2015 Imaging Stable post treatment related changes of left lower lobectomy and radiation therapy redemonstrated, as above. No definite findings to suggest local recurrence of disease or metastatic disease on today's examination    Brain metastases treated with radiosurgery   02/26/2012 Initial Diagnosis Brain metastases treated with radiosurgery          ~  July 15, 2015:  Initial pulmonary consult by SN>  50 y/o BF  referred by [facility] for COPD & dyspnea> she has a hx of stage 1 colon cancer diagnosed Nov2008, and stage 4 lung cancer diagnosed Jan2012...    [name] was diagnosed with Adenocarcinoma of lung Dx 05/2010 w/ LULobectomy, then 10/2011 mediastinal node Bx pos for metastatic lung cancer, given chemoradiation, then 01/2012 diagnosed w/ brain mets treated w/ stereotactic radiosurg, followed by additional chemotherapy (maintenance Altima)- stopped 03/2013 due to severe fatigue;  She developed right pleural effusion, neg cytology, drained & pleurodesis 05/2013;  CT Chest 03/2014 showed widespread pulm mets- treated w/ Carboplatin/ Altima x 4cycles last 06/2014; follow up CT scans have shown regression in size of lung nodules, then stability...     She is a current everyday smoker- starting at age 36 & smoked for 50 yrs up to 1ppd, currently smoking 1/2-1ppd; she is not interested in smoking cessation help;  Prev PFTs showed mod airflow obstruction c/w GOLD Stage 3 COPD and last CXR 12/2013 showed norm heart size, right sided pot-a-cath, right effusion, incr soft tissue changes along the right paratrachel region- stable, NAD;  Last CT Chest 04/2015 showed norm heart size, atherosclerosis in Ao & coronaries, no definite new mediastinal or hilar adenopathy, s/p left upper lobectomy, septal thickening & micronodularity throughout right lung w/o change from 01/2015, chr pleural thickening & calcif + mucoid impactions (see full report)...    [name] is c/o SOB/DOE w/ any activity- ok at rest but says she feels like there is an elephant on her body weighing her down, she feels tight, occas wheezing, min cough- mostly irritative and more at night, no sput, no hemoptysis, denies CP but she is weak & has fallen several times;  She lives alone & is here w/ her cousin today... Current meds include>  AlbutHFA prn, MCN47, Bystolic10, SJGGE36, O29UTM, Prav40, Prilosec40, Zanaflex2 prn, Tramadol50 prn, Prozac20, Remeron15... EXAM shows Afeb,  VSS, O2sat=100% on RA at rest;  HEENT- neg, mallamapti1;  Chest- sl decr BS right base, clear w/o w/r/r;  Heart- RR w/o m/r/g;  Abd- soft, nontender, neg;  Ext- VI, 1+edema;  Neuro- gait abn & neuropathy...   CXR 07/15/15>  Norm heart size, post radiation scarring on right & bibasilar scarring w/o change from prev, clips in left hilum & right sided port-a-cath stable...  Spirometry 07/15/15>  FVC=1.58 (63%), FEV1=0.98 (50%), %1sec=62, mid-flows are reduced at 26% predicted;  This is c/w mod airflow obstruction and GOLD Stage 2-3 COPD...  Ambulatory oximetry 07/15/15 on RA>  O2sat=100% on RA at rest w/ pulse=85/min; she ambulated only 1lap, stopped 1/2 way due to dizziness & was able to continue under her own power, lowest O2sat=99% w/ pulse=90/min... IMP >>      COPD/ Emphysema>  She has mod airflow obstruction and GOLD Stage 3 COPD; try ANORO one inhalation daily + ProairHFA rescue inhaler prn...    Stage 4 Adenocarcinoma of the Lung>  Followed by DrGorsuch regularly=> see Table of treatment modalities...    Chronic right pleural effusion>  Non-malignant on thoracenteses, s/p pleur-X cath drainage and pleurodesis...    Cigarette Smoker>  She is still smoking 1/2 to 1ppd (w/ a 50+pack-yr smoking hx) and we discussed cutting back some to start...    Hx Hypoxemic Resp Failure>  She has been on Home O2 from prev hospitalization but current oxygenation at rest & with walking is OK...    MEDICAL issues>  HBP. CAD, VI/ edema, HL, GERD, Hx colon cancer, DJD, LBP, anxiety/ depression; [facility], DrMcDiarmid... PLAN >>     We discussed the imperative to quit smoking;  She will try to cut back;  We will start Rx w/ ANORO one inhalation daily & use the PROAIR-HFA as a rescue inhaler;  Finally we discussed a trial of low dose KLONOPIN 0.378m bid to see if it relaxes her & improves her dyspnea... We will f/u in 6wks...  ~  August 28, 2015:  6wk ROV w/ SN>      OShantilreturns having started  AGl[facility] Digest[facility] Disease Instituteone inhalation daily, PROAIR-HFA rescue inhaler prn, and KLONOPIN 0.571mid;  However she states that she isn't really taking any of these meds due to various perceived issues- not doing the Anoro daily (?but can't tell me why?- cost, other), and she states that she "couldn't handle the Klonopin due to dizziness but she notes that it DID help her breathing;  She is still smoking but thinks that she's prob cut back to 1/2 ppd;  She had 3 f/u appts in March>   1) She's had f/u w/ DrGorsuch for Oncology> on maintenance chemotherapy (Altima- Q28d now) & she says she is going to stay on this [place] She had f/u DrSquires for RAD-ONC> s/p XRT for 2 brain mets and f/u MRI w/o recurrence; pt tells me "I am one of her miracle pts" 3) She's had f/u by her PCP- DrMcDiarmid for her dizziness, falls, 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     COPD/ Emphysema>  She has mod airflow obstruction and GOLD Stage 3 COPD; Rec- ANORO one inhalation daily + ProairHFA rescue inhaler prn...    Stage 4 Adenocarcinoma of the Lung>  Followed by DrGorsuch regularly & currently on maintenance ALTIMA Q28d regimen=> see Table of treatment modalities...    Chronic right pleural effusion>  Non-malignant on thoracenteses, s/p pleur-X cath drainage and pleurodesis...    Cigarette Smoker>  She is still smoking 1/2 to 1ppd (w/ a 50+pack-yr smoking hx) and we discussed cutting back some to start...    Hx Hypoxemic Resp Failure>  She has been on Home O2 from prev hospitalization but current oxygenation at rest & with walking is OK...    Severely deconditioned>  [facility] and PT & Neuro-rehab from PCP for her deconditioning; she needs to quit all smoking and incr exercise regimen.....Marland KitchenMarland KitchenMarland Kitchen   MEDICAL issues>  HBP. CAD, VI/ edema, HL, GERD, Hx colon cancer, DJD, LBP, anxiety/ depression, ANEMIA, NEUROPATHY; [facility]... EXAM shows Afeb, VSS, O2sat=99% on RA at rest;  HEENT- neg, mallamapti1;  Chest- sl decr BS right base, clear w/o w/r/r;  Heart- RR w/o m/r/g;  Abd- soft, nontender, neg;  Ext- VI, 1+edema;  Neuro- gait abn & neuropathy...  IMP/PLAN>>  We discussed her progress & she is reminded of the NEED to quit all smoking, take the Anoro once daly every day, use the rescue inhaler as needed, and restart the Klonopin at 1/2 tab Bid;  Finally she needs to increase her exercise program... we will plan a routine ROV in 78m39mo~  November 28, 2015:  78mo32mo & OlivGeovannastill smoking ~1ppd despite all that she's been through; she says she feels well, breathing is good, walking w/ a cane & only prob is the heat & humidity;  She does her ADLs, light housework, walking some; She notes min cough, small  amt thick beige sput, no hemoptysis; she denies CP, f/c/s, etc; she has known bronchiectasis & mucoid impactions and we reviewed how helpful it would be to quit smoking, take the max Mucinex 1200Bid w/ fluids & engage in chest physiotherpy w/ Flutter valve & postural drainage (but she declines) ... She is using her ANORO 1 inhalation daily & the Albut rescue inhaler as needed...     She saw DrGorsuch, Oncology 11/27/15> Now on treatment w/ OPDIVO & CT Chest showed good response w/ resolution of RUL nodule; they plan continued Rx QoWK w/ f/u CT in 71mo..   EXAM shows Afeb, VSS, O2sat=100% on RA at rest;  HEENT- neg, mallamapti1;  Chest- sl decr BS right base, clear w/o w/r/r;  Heart- RR w/o m/r/g;  Abd- soft, nontender, neg;  Ext- VI, 1+edema;  Neuro- gait abn & neuropathy...   CT Chest 11/26/15>  S/p LLLobectomy & radiation changes, chronic areas of mucoid impaction throughout the right lung are similar to prev, Ao & coronary atherosclerosis. Ectasia of infrrenal Ao IMP/PLAN>>  ODarlishais rec to quit smoking & continue the Anoro & Albut rescue prn; we reviewed the benefits derived from smojking cessation +  Mucinex/ fluids/ chest physiotherapy/ etc but she declines these latter treatments... we will plan ROV recheck in 662mosooner prn...   ~  June 01, 2016:  72m78moV & pulmonary follow up visit>  Unfortunately OliTashyas had recurrence of her metastatic lung cancer (adenoca) & DrGorsuch has switched her Altima to Gemzar & Cisplatin...     COPD/ Emphysema>  OliKimarintinues to smoke (albeit now down to 1/2 ppd) & has been extensively counseled by mult physicians on mult occas to no avail; [facility] daily & reports that her breathing is at baseline- stoic, she denies cough, sput, hemoptysis or SOB; notes chr stable DOE, no f/c/s, no CP, etc...    Problems as noted above... EXAM shows Afeb, VSS, O2sat=100% on RA at rest;  HEENT- neg, mallamapti1;  Chest- sl decr BS right base, clear w/o w/r/r;  Heart- RR w/o m/r/g;  Abd- soft, nontender, neg;  Ext- VI, 1+edema;  Neuro- gait abn & neuropathy...   CT Chest 04/15/16 showed norm heart size, advanced atherosclerotic calcif w/ tortuosity & ectasia of ao, 3 vessel CAD; numerous new pulm & enlarging nodules c/w widespread metastatic dis, +mediastinal changes...  MRI Brain 04/30/16 showed decreased contrast enhancement of the two treated mets in right frontal & left occipital lobes, no new lesions noted...  CXR 06/01/16 ((independently reviewed by me in the PACS system) showed norm heart size, tort Ao, numerous metastatic nodules are thought to have progressed compared to CXR of 06/2015 IMP/PLAN>>  Recurrent metastatic Adenoca of lung on Chemo per DrGorsuch; OliNoreene implored to quit all smoking & take the [facility]; she will call for any resp issues 7 we will plan rov recheck in 60mo79mo   Past Medical History:  Diagnosis Date  . Abnormality of gait 09/10/2015  . Acute on chronic respiratory failure with hypoxemia (HCC)[place]23/2017  . Anemia due to chemotherapy 08/29/2014  . 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 Angina pectoris (HCC)[place]/19/2017   . Arthritis   . ASCUS with positive high risk HPV 04/21/2012   colpo clinically normal 04/2012.  Given her other significant co morbidities (recurrent lung cancer, brain mets and colon cancer requiring chronic chemo) I don't think I would be very aggressive in pursuing any more paps. If she felt necessary, could do  a repeat in one year, but the likelihood is that this is the least of her problems and I doubt she will benefit from repeated paps and colpos and I sincerely doubt anything we find would change the course of her life. We discussed. She has already decided to consider stopping chemo in May because after 5 years she is quite tired of it. I would always be willing to do a pap, but my recommendation is to stop.   . Back pain   . Benign paroxysmal positional vertigo 08/16/2015  . Bilateral hearing loss, Mild 02/27/2016   03/23/16 Hearing loss Evaluation by Unice Bailey, MD, [facility], Dx with mild bilateral hearing loss except normal hearing in right ear of 500 Hz and 1000 Hz.  Dr Redmond Baseman recommends annual hearing evaluations.   . Bilateral leg edema 10/17/2014  . Bilateral pleural effusion 08/09/2013  . Blurred vision, bilateral 06/06/2014  . Brain metastases ([place]) 02/15/12  . Cerebral aneurysm   . CEREBRAL ANEURYSM 12/29/2006   Qualifier: Diagnosis of  By: Girard Cooter MD, MAKEECHA    . Chronic dermatitis of hands 02/27/2016  . Chronic folliculitis    of groin  . Colon cancer ([place]) 11/08  . COPD (chronic obstructive pulmonary disease) ([place])   . COPD mixed type ([place]) 07/15/2015  . Coronary artery disease   . Coronary atherosclerosis 09/09/2007   Qualifier: Diagnosis of  By: [place], Bonnie    . Depression   . Depressive disorder   . DEPRESSIVE DISORDER, MAJOR, RCR, MILD 12/29/2006   Qualifier: Diagnosis of  By: Girard Cooter MD, MAKEECHA    . Dyspnea 07/15/2015  . Encounter for antineoplastic chemotherapy 09/11/2015  . Encounter for therapeutic drug monitoring 09/11/2015  .  Essential hypertension 12/29/2006   Echocardiogram (07/26/13): Left ventricle: The cavity size was normal. Wall thickness was increased in a pattern of mild LVH. Systolic function was normal. The estimated ejection fraction was in the range of 55% to 60%. Wall motion was normal; there were no regional wall motion abnormalities. Normal mitral valve.    . Falls frequently 08/02/2015  . Family history of Huntington's disease   . Family history of trichomonal vaginitis 05/2005  . Fatigue 02/06/2013  . 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 Fibrocystic breast changes   . GERD 12/29/2006   Qualifier: Diagnosis of  By: Girard Cooter MD, MAKEECHA    . GERD (gastroesophageal reflux disease)   . History of colon cancer 05/31/2013  . Hypercholesterolemia   . HYPERCHOLESTEROLEMIA 12/29/2006   Qualifier: Diagnosis of  By: Girard Cooter MD, MAKEECHA    . Hyperlipidemia   . Hypertension   . Hypokalemia 08/09/2013  . Insomnia 10/05/2011  . Left-sided low back pain with left-sided sciatica 07/18/2014  . Low back pain with sciatica 06/22/2014  . Lung cancer ([place]) 06/16/10   PET scan 04/28/2010; primary: increase in size 02/2010 / Well Differentiated Adenocarcinoma of the lung   . Lung nodule    FNA ordered for 04/02/10 by HA>pos Ca  . LVH (left ventricular hypertrophy) 12/20/2014   Echocardiogram (07/26/13): Left ventricle: Wall thickness was increased in a pattern of mild LVH. The cavity size was normal. Systolic function was normal. The estimated ejection fraction was in the range of 55% to 60%. Wall motion was normal; there were no regional wall motion abnormalities. Normal mitral valve.   . On antineoplastic chemotherapy started 02/2012   Alimta  . Other fatigue 11/28/2014  . Pleural effusion 05/03/2013  . Pleural effusion, malignant  05/2013   Recurrent Pleural Effusion  . Postmenopausal   . POSTMENOPAUSAL SYNDROME 01/31/2009   Qualifier: Diagnosis of  By: Carlena Sax  MD, [place]ta [place]    . Postural dizziness 06/19/2015  .  Protein-calorie malnutrition, moderate ([place]) 09/11/2015  . S/P radiation therapy 03/01/12   SRS: 1 fraction / 20 Gray each to the Left Occipital Region and to the Right Insular Metastases  . Sensorineural hearing loss (SNHL) of both ears 04/23/2016   Dr Keturah Barre. Redmond Baseman (ENT) dx significant bilateral hearing loss and recommended biaural hearing aids.   . Shortness of breath   . Skin rash 10/03/2015  . Status post chemotherapy comp. 12/29/11   Carboplatin/Taxol  . Status post radiation therapy 11/16/11 - 12/29/11   Right Lung and Mediastinum: 60 Gy  . Status post stereotactic radiosurgery 01/2012   for Brain Metastases  . Tobacco dependence   . Vitamin D deficiency 08/30/2015  . Weight loss 10/22/2011    Past Surgical History:  Procedure Laterality Date  . BACK SURGERY     Dr Luiz Ochoa  . BREAST SURGERY     Bil lumpectomy  . cardiac cath x3    . CHEST TUBE INSERTION Right 06/12/2013   Procedure: INSERTION PLEURAL DRAINAGE CATHETER;  Surgeon: Gaye Pollack, MD;  Location: [place];  Service: Thoracic;  Laterality: Right;  . COLECTOMY  03/22/07   Stage 1 pT2 N0, M0 Adenocarcinoma of the sigmoid  colon  . HERNIA REPAIR    . LUNG LOBECTOMY  06/16/10   Left Upper Lobectomy  . MEDIASTINOSCOPY  10/19/2011   Procedure: MEDIASTINOSCOPY;  Surgeon: Gaye Pollack, MD;  Location: [place];  Service: Thoracic;  Laterality: N/A;  . TALC PLEURODESIS Right 06/12/2013   Procedure: Pietro Cassis;  Surgeon: Gaye Pollack, MD;  Location: [place];  Service: Thoracic;  Laterality: Right;  . TUBAL LIGATION    . TUNNELED VENOUS CATHETER PLACEMENT     Port-a-Cath    Outpatient Encounter Prescriptions as of 06/01/2016  Medication Sig  . albuterol (PROVENTIL HFA;VENTOLIN HFA) 108 (90 Base) MCG/ACT inhaler Inhale 1 puff into the lungs 2 (two) times daily.  Marland Kitchen aspirin 81 MG chewable tablet Chew 81 mg by mouth daily.  Marland Kitchen BYSTOLIC 10 MG tablet TAKE 1 TABLET (10 MG TOTAL) BY MOUTH DAILY.  Marland Kitchen CAPSAICIN EX Apply topically as directed. To  feet   . cholecalciferol (VITAMIN D) 1000 units tablet Take 1,000 Units by mouth daily.  Marland Kitchen COLACE 100 MG capsule Take 100 mg by mouth daily.  . diclofenac sodium (VOLTAREN) 1 % GEL Apply 2 g topically 4 (four) times daily. As needed for pain  . gabapentin (NEURONTIN) 300 MG capsule Take 1 capsule (300 mg total) by mouth 3 (three) times daily. (Patient taking differently: Take 300 mg by mouth daily. )  . HYDROmorphone (DILAUDID) 2 MG tablet Take 1 tablet (2 mg total) by mouth every 6 (six) hours as needed for severe pain.  Marland Kitchen lidocaine-prilocaine (EMLA) cream Apply topically as needed. Apply to porta cath site one hour prior to needle stick.  . nitroGLYCERIN (NITROSTAT) 0.4 MG SL tablet Place 1 tablet (0.4 mg total) under the tongue every 5 (five) minutes as needed. For chest pain  . omeprazole (PRILOSEC) 40 MG capsule Take 40 mg by mouth daily.  . ondansetron (ZOFRAN ODT) 4 MG disintegrating tablet Take 1 tablet (4 mg total) by mouth every 8 (eight) hours as needed for nausea or vomiting.  . potassium chloride SA (K-DUR,KLOR-CON) 20 MEQ tablet Take 20  mEq by mouth daily.   . pravastatin (PRAVACHOL) 40 MG tablet Take 40 mg by mouth daily.  Marland Kitchen tiZANidine (ZANAFLEX) 2 MG tablet Take 1 tablet (2 mg total) by mouth every 6 (six) hours as needed (Back Muscle Spasm).  . traMADol (ULTRAM) 50 MG tablet Take 1 tablet (50 mg total) by mouth every 6 (six) hours as needed for moderate pain or severe pain.  Marland Kitchen umeclidinium-vilanterol (ANORO ELLIPTA) 62.5-25 MCG/INH AEPB Inhale 1 puff into the lungs daily.  . [DISCONTINUED] umeclidinium-vilanterol (ANORO ELLIPTA) 62.5-25 MCG/INH AEPB Inhale 1 puff into the lungs daily.  Marland Kitchen HYDROcodone-homatropine (HYCODAN) 5-1.5 MG/5ML syrup Take 5 mLs by mouth every 6 (six) hours as needed for cough. (Patient not taking: Reported on 06/01/2016)  . umeclidinium-vilanterol (ANORO ELLIPTA) 62.5-25 MCG/INH AEPB Inhale 1 puff into the lungs daily.   Facility-Administered Encounter  Medications as of 06/01/2016  Medication  . sodium chloride 0.9 % injection 10 mL  . sodium chloride 0.9 % injection 10 mL    Allergies  Allergen Reactions  . Adhesive [Tape] Rash  . Lisinopril Cough    Current Medications, Allergies, Past Medical History, Past Surgical History, Family History, and Social History were reviewed in [employer] record.   Review of Systems             All symptoms NEG except where BOLDED >>  Constitutional:  F/C/S, fatigue, anorexia, unexpected weight change. HEENT:  HA, visual changes, hearing loss, earache, nasal symptoms, sore throat, mouth sores, hoarseness. Resp:  cough, sputum, hemoptysis; SOB, tightness, wheezing. Cardio:  CP, palpit, DOE, orthopnea, edema. GI:  N/V/D/C, blood in stool; reflux, abd pain, distention, gas. GU:  dysuria, freq, urgency, hematuria, flank pain, voiding difficulty. MS:  joint pain, swelling, tenderness, decr ROM; neck pain, back pain, etc. Neuro:  HA, tremors, seizures, dizziness, syncope, weakness, numbness, gait abn. Skin:  suspicious lesions or skin rash. Heme:  adenopathy, bruising, bleeding. Psyche:  confusion, agitation, sleep disturbance, hallucinations, anxiety, depression suicidal.   Objective:   Physical Exam       Vital Signs:  Reviewed...   General:  WD, WN, 66 y/o BF in NAD, chr ill appearing; alert & oriented; pleasant & cooperative... HEENT:  [place]/AT; Conjunctiva- pink, Sclera- nonicteric, EOM-wnl, PERRLA, EACs-wax, TMs-no vis; NOSE-clear; THROAT-clear & wnl.  Neck:  Supple w/ decr ROM; no JVD; normal carotid impulses w/o bruits; no thyromegaly or nodules palpated; no lymphadenopathy.  Chest:  Sl decr BS right base, otherw clear without wheezes, rales, or rhonchi heard. Heart:  Regular Rhythm; norm S1 & S2 without murmurs, rubs, or gallops detected. Abdomen:  Soft & nontender- no guarding or rebound; normal bowel sounds; no organomegaly or masses palpated. Ext:  decr ROM;  without deformities, +arthritic changes; no varicose veins, +venous insuffic, 1+edema;  Pulses intact w/o bruits. Neuro:  CNs II-XII intact; motor testing normal; sensory testing abnormal; gait abnormal & balance if fair Derm:  No lesions noted; no rash etc. Lymph:  No cervical, supraclavicular, axillary, or inguinal adenopathy palpated.   Assessment:      IMP >>      COPD/ Emphysema>  She has mod airflow obstruction and GOLD Stage 3 COPD; Rec- continue ANORO one inhalation daily + ProairHFA rescue inhaler prn...    Stage 4 Adenocarcinoma of the Lung>  Followed by DrGorsuch regularly & currently on maintenance OPDIVO Q14d regimen=> see Table of treatment modalities...    Chronic right pleural effusion>  Non-malignant on thoracenteses, s/p pleur-X cath drainage and pleurodesis.Marland KitchenMarland Kitchen  Cigarette Smoker>  She is still smoking 1ppd (w/ a 50+pack-yr smoking hx) and we discussed cutting back some to start...    Hx Hypoxemic Resp Failure>  She has been on Home O2 from prev hospitalization but current oxygenation at rest & with walking is OK...    Severely deconditioned>  She has received [facility] outreach, and PT & Neuro-rehab from PCP for her deconditioning; she needs to quit all smoking and incr exercise regimen...Marland KitchenMarland KitchenMarland Kitchen     MEDICAL issues>  HBP. CAD, VI/ edema, HL, GERD, Hx colon cancer, DJD, LBP, anxiety/ depression, ANEMIA, NEUROPATHY; her PCP is [facility], DrMcDiarmid...  PLAN >>  07/15/15>   We discussed the imperative to quit smoking;  She will try to cut back;  We will start Rx w/ ANORO one inhalation daily & use the PROAIR-HFA as a rescue inhaler;  Finally we discussed a trial of low dose KLONOPIN 0.30m bid to see if it relaxes her & improves her dyspnea... 08/28/15>   We discussed her progress & she is reminded of the NEED to quit all smoking, take the Anoro once daly every day, use the rescue inhaler as needed, and restart the Klonopin at 1/2 tab Bid;  Finally she needs to increase her  exercise program... . 11/28/15>   OStormeeis rec to quit smoking & continue the Anoro & Albut rescue prn; we reviewed the benefits derived from smojking cessation + Mucinex/ fluids/ chest physiotherapy/ etc but she declines these latter treatments... 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  Previous Medications   ALBUTEROL (PROVENTIL HFA;VENTOLIN HFA) 108 (90 BASE) MCG/ACT INHALER    Inhale 1 puff into the lungs 2 (two) times daily.   ASPIRIN 81 MG CHEWABLE TABLET    Chew 81 mg by mouth daily.   BYSTOLIC 10 MG TABLET    TAKE 1 TABLET (10 MG TOTAL) BY MOUTH DAILY.   CAPSAICIN EX    Apply topically as directed. To feet    CHOLECALCIFEROL (VITAMIN D) 1000 UNITS TABLET    Take 1,000 Units by mouth daily.   COLACE 100 MG CAPSULE    Take 100 mg by mouth daily.   DICLOFENAC SODIUM (VOLTAREN) 1 % GEL    Apply 2 g topically 4 (four) times daily. As needed for pain   GABAPENTIN (NEURONTIN) 300 MG CAPSULE    Take 1 capsule (300 mg total) by mouth 3 (three) times daily.   HYDROCODONE-HOMATROPINE (HYCODAN) 5-1.5 MG/5ML SYRUP    Take 5 mLs by mouth every 6 (six) hours as needed for cough.   HYDROMORPHONE (DILAUDID) 2 MG TABLET    Take 1 tablet (2 mg total) by mouth every 6 (six) hours as needed for severe pain.   LIDOCAINE-PRILOCAINE (EMLA) CREAM    Apply topically as needed. Apply to porta cath site one hour prior to needle stick.   NITROGLYCERIN (NITROSTAT) 0.4 MG SL TABLET    Place 1 tablet (0.4 mg total) under the tongue every 5 (five) minutes as needed. For chest pain   OMEPRAZOLE (PRILOSEC) 40 MG CAPSULE  Take 40 mg by mouth daily.    ONDANSETRON (ZOFRAN ODT) 4 MG DISINTEGRATING TABLET    Take 1 tablet (4 mg total) by mouth every 8 (eight) hours as needed for nausea or vomiting.   POTASSIUM CHLORIDE SA (K-DUR,KLOR-CON) 20 MEQ TABLET    Take 20 mEq by mouth daily.    PRAVASTATIN (PRAVACHOL) 40 MG TABLET    Take 40 mg by mouth daily.   TIZANIDINE (ZANAFLEX) 2 MG TABLET    Take 1 tablet (2 mg total) by mouth every 6 (six) hours as needed (Back Muscle Spasm).   TRAMADOL (ULTRAM) 50 MG TABLET    Take 1 tablet (50 mg total) by mouth every 6 (six) hours as needed for moderate pain or severe pain.  Modified Medications   Modified Medication Previous Medication   UMECLIDINIUM-VILANTEROL (ANORO ELLIPTA) 62.5-25 MCG/INH AEPB umeclidinium-vilanterol (ANORO ELLIPTA) 62.5-25 MCG/INH AEPB      Inhale 1 puff into the lungs daily.    Inhale 1 puff into the lungs daily.  Discontinued Medications   No medications on file

## 2016-06-02 ENCOUNTER — Other Ambulatory Visit: Payer: Self-pay | Admitting: *Deleted

## 2016-06-02 NOTE — Patient Outreach (Signed)
[facility]) Care Management  06/02/2016   Lauren [name] 1951-02-16 [id number]  RN Health Coach telephone call to patient.  [employer] compliance verified. Per patient she still has a cough but it is clear. No discoloration noted.  Patient reports still currently smoking. Patient is on a new chemo regimen for the lung cancer. Patient reports under stress taking care of son. Patient was looking for a primary care provider for son. RN discussed with patient about asking her primary care physician about him.  Patient stated her appetite is not as good. RN discussed with patient about eating small meals and drinking nutritional supplements. Patient has agreed to follow up outreach calls.   Objective:   Current Medications:  Current Outpatient Prescriptions  Medication Sig Dispense Refill  . albuterol (PROVENTIL HFA;VENTOLIN HFA) 108 (90 Base) MCG/ACT inhaler Inhale 1 puff into the lungs 2 (two) times daily. 6.7 g 6  . aspirin 81 MG chewable tablet Chew 81 mg by mouth daily.    Marland Kitchen BYSTOLIC 10 MG tablet TAKE 1 TABLET (10 MG TOTAL) BY MOUTH DAILY. 90 tablet 3  . CAPSAICIN EX Apply topically as directed. To feet     . cholecalciferol (VITAMIN D) 1000 units tablet Take 1,000 Units by mouth daily.    Marland Kitchen COLACE 100 MG capsule Take 100 mg by mouth daily.    . diclofenac sodium (VOLTAREN) 1 % GEL Apply 2 g topically 4 (four) times daily. As needed for pain 100 g 3  . gabapentin (NEURONTIN) 300 MG capsule Take 1 capsule (300 mg total) by mouth 3 (three) times daily. (Patient taking differently: Take 300 mg by mouth daily. ) 90 capsule 1  . HYDROcodone-homatropine (HYCODAN) 5-1.5 MG/5ML syrup Take 5 mLs by mouth every 6 (six) hours as needed for cough. (Patient not taking: Reported on 06/01/2016) 240 mL 0  . HYDROmorphone (DILAUDID) 2 MG tablet Take 1 tablet (2 mg total) by mouth every 6 (six) hours as needed for severe pain. 30 tablet 0  . lidocaine-prilocaine (EMLA) cream Apply topically  as needed. Apply to porta cath site one hour prior to needle stick. 30 g 6  . nitroGLYCERIN (NITROSTAT) 0.4 MG SL tablet Place 1 tablet (0.4 mg total) under the tongue every 5 (five) minutes as needed. For chest pain 25 tablet 5  . omeprazole (PRILOSEC) 40 MG capsule Take 40 mg by mouth daily.    . ondansetron (ZOFRAN ODT) 4 MG disintegrating tablet Take 1 tablet (4 mg total) by mouth every 8 (eight) hours as needed for nausea or vomiting. 20 tablet 0  . potassium chloride SA (K-DUR,KLOR-CON) 20 MEQ tablet Take 20 mEq by mouth daily.     . pravastatin (PRAVACHOL) 40 MG tablet Take 40 mg by mouth daily.    Marland Kitchen tiZANidine (ZANAFLEX) 2 MG tablet Take 1 tablet (2 mg total) by mouth every 6 (six) hours as needed (Back Muscle Spasm). 30 tablet 3  . traMADol (ULTRAM) 50 MG tablet Take 1 tablet (50 mg total) by mouth every 6 (six) hours as needed for moderate pain or severe pain. 90 tablet 1  . umeclidinium-vilanterol (ANORO ELLIPTA) 62.5-25 MCG/INH AEPB Inhale 1 puff into the lungs daily. 3 each 3  . umeclidinium-vilanterol (ANORO ELLIPTA) 62.5-25 MCG/INH AEPB Inhale 1 puff into the lungs daily. 2 each 0   No current facility-administered medications for this visit.    Facility-Administered Medications Ordered in Other Visits  Medication Dose Route Frequency Provider Last Rate Last Dose  . sodium chloride  0.9 % injection 10 mL  10 mL Intravenous PRN Heath Lark, MD   10 mL at 09/18/15 1345  . sodium chloride 0.9 % injection 10 mL  10 mL Intravenous PRN Heath Lark, MD   10 mL at 04/24/16 1136    Functional Status:  In your present state of health, do you have any difficulty performing the following activities: 05/01/2016 04/07/2016  Hearing? N Y  Vision? N Y  Difficulty concentrating or making decisions? [name]  Walking or climbing stairs? Y Y  Dressing or bathing? Y N  Doing errands, shopping? [name]  Preparing Food and eating ? Y -  Using the Toilet? N -  In the past six months, have you accidently  leaked urine? N -  Do you have problems with loss of bowel control? N -  Managing your Medications? Y -  Managing your Finances? Y -  Housekeeping or managing your Housekeeping? - -  Some recent data might be hidden    Fall/Depression Screening: PHQ 2/9 Scores 06/02/2016 05/04/2016 05/01/2016 04/23/2016 04/07/2016 03/09/2016 02/27/2016  PHQ - 2 Score 0 0 0 0 0 0 0  PHQ- 9 Score - - - - - - -  Exception Documentation - - - - - - -   THN CM Care Plan Problem One   Flowsheet Row Most Recent Value  Care Plan Problem One  knowledge deficit in self management of COPD  Role Documenting the Problem One  [place] for Problem One  Active  THN Long Term Goal (31-90 days)  Patient will not have any readmissions for COPD within the next 90 days  THN Long Term Goal Start Date  06/02/16  Interventions for Problem One Long Term Goal  RN reminded patient to keep appointments with PCP and oncology and pulmonary specialist. RN reminded patient to take medications as per order.  THN CM Short Term Goal #1 (0-30 days)  Patient will report better nutritional intake within the next 30 days  THN CM Short Term Goal #1 Start Date  06/02/16  Interventions for Short Term Goal #1  RN discussed with patient about eating small meals and snacks. RN discussed with patient about drinking more nutritional supplements  THN CM Short Term Goal #5 (0-30 days)  Patient will verbalize witin the next 30 days that she is getting in into a routine exercise program  Interventions for Short Term Goal #5  RN sent patient a list of chair exercises with pictures. RN discussed with patient the importance of building up her strength. RN discusses with patient about taking short walks       Assessment:   Patient is having a productive cough clear sputum Patient is still currently smoking Patient will continue to benefit from [employer] telephonic outreach for education and support for copd self management. Plan:   Patient will check with PCP to see if He will take son on as a patient Patient is adhering to Dr visits Patient is going to try to increase intake Patient will try to increase activity RN will follow up outreach call within the month of February  Vollie Brunty [facility] Care Management [phone]

## 2016-06-04 ENCOUNTER — Ambulatory Visit: Payer: Self-pay | Admitting: Family Medicine

## 2016-06-08 ENCOUNTER — Telehealth: Payer: Self-pay | Admitting: Pulmonary Disease

## 2016-06-08 MED ORDER — METHYLPREDNISOLONE 4 MG PO TBPK: ORAL_TABLET | ORAL | 0 refills | Status: DC

## 2016-06-08 MED ORDER — AZITHROMYCIN 250 MG PO TABS: ORAL_TABLET | ORAL | 0 refills | Status: AC

## 2016-06-08 MED FILL — METHYLPREDNISOLONE 4 MG TAB: 4 | 6 days supply | Qty: 21 | Fill #0

## 2016-06-08 MED FILL — AZITHROMYCIN 250 MG TABLET: 250 | 5 days supply | Qty: 6 | Fill #0

## 2016-06-08 NOTE — Telephone Encounter (Signed)
Pt c/o increased runny/stuffy nose, PND, prod cough with clear mucus, wheezing X2-3 days.   Denies fever, chest pain, discolored mucus.  Pt has not taken any medications to help with s/s- afraid to take anything without doctor recommendation d/t being on chemo.    Pharmacy: [facility] [place] outpatient pharmacy.   SN please advise on recs.  Thanks.

## 2016-06-08 NOTE — Telephone Encounter (Signed)
Per SN--zpak, medrol dose pack and recommend stopping smoking. Pt aware of SN's recommendations and voiced her understanding. Rx sent to preferred pharmacy. Nothing further needed.

## 2016-06-08 NOTE — Telephone Encounter (Signed)
lmtcb X1 

## 2016-06-09 ENCOUNTER — Ambulatory Visit (INDEPENDENT_AMBULATORY_CARE_PROVIDER_SITE_OTHER): Payer: Medicare HMO | Admitting: Gastroenterology

## 2016-06-09 ENCOUNTER — Encounter: Payer: Self-pay | Admitting: Gastroenterology

## 2016-06-09 VITALS — BP 134/72 | HR 96 | Ht 64.0 in | Wt 159.0 lb

## 2016-06-09 DIAGNOSIS — Z85038 Personal history of other malignant neoplasm of large intestine: Secondary | ICD-10-CM

## 2016-06-09 DIAGNOSIS — C3490 Malignant neoplasm of unspecified part of unspecified bronchus or lung: Secondary | ICD-10-CM

## 2016-06-09 NOTE — Progress Notes (Signed)
Review of pertinent gastrointestinal problems: 1. Personal history of colon cancer. Colon cancer T2 N0 M0 by colonoscopy 2008. Surgically resected. Colonoscopy 2009 found no polyps. Colonoscopy 2012 found 5 polyps; recommended recall at 3 years  HPI: This is a   whovery pleasant 66 year old woman was referred to me by Heath Lark, MD  to evalpersonal history of colon cancer .    Chief complaint is  personal history of colon cancer  She had a T2 colon cancer surgically resected about 10 years ago. She did not require any adjuvant treatments. Follow-up colonoscopies listed above.  No overt gi issues.  specifically no overt GI bleeding. No significant new changes in her bowels, no serious abdominal pains   Stool softner helps with constipation  No overt gi bleeding.  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 She has had radiation to her brain.  Review of systems: Pertinent positive and negative review of systems were noted in the above HPI section. Complete review of systems was performed and was otherwise normal.   Past Medical History:  Diagnosis Date  . Abnormality of gait 09/10/2015  . Acute on chronic respiratory failure with hypoxemia ([place]) 07/11/2015  . Anemia due to chemotherapy 08/29/2014  . 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 Angina pectoris ([place]) 03/05/2016  . Arthritis   . ASCUS with positive high risk HPV 04/21/2012   colpo clinically normal 04/2012.  Given her other significant co morbidities (recurrent lung cancer, brain mets and colon cancer requiring chronic chemo) I don't think I would be very aggressive in pursuing any more paps. If she felt necessary, could do a repeat in one year, but the likelihood is that this is the least of her problems and I doubt she will benefit from repeated paps and colpos and I sincerely doubt anything we find would  change the course of her life. We discussed. She has already decided to consider stopping chemo in May because after 5 years she is quite tired of it. I would always be willing to do a pap, but my recommendation is to stop.   . Back pain   . Benign paroxysmal positional vertigo 08/16/2015  . Bilateral hearing loss, Mild 02/27/2016   03/23/16 Hearing loss Evaluation by Unice Bailey, MD, [facility], Dx with mild bilateral hearing loss except normal hearing in right ear of 500 Hz and 1000 Hz.  Dr Redmond Baseman recommends annual hearing evaluations.   . Bilateral leg edema 10/17/2014  . Bilateral pleural effusion 08/09/2013  . Blurred vision, bilateral 06/06/2014  . Brain metastases ([place]) 02/15/12  . Cerebral aneurysm   . CEREBRAL ANEURYSM 12/29/2006   Qualifier: Diagnosis of  By: Girard Cooter MD, MAKEECHA    . Chronic dermatitis of hands 02/27/2016  . Chronic folliculitis    of groin  . Colon cancer ([place]) 11/08  . COPD (chronic obstructive pulmonary disease) ([place])   . COPD mixed type ([place]) 07/15/2015  . Coronary artery disease   . Coronary atherosclerosis 09/09/2007   Qualifier: Diagnosis of  By: [place], Bonnie    . Depression   . Depressive disorder   . DEPRESSIVE DISORDER, MAJOR, RCR, MILD 12/29/2006   Qualifier: Diagnosis of  By: Girard Cooter MD, MAKEECHA    . Dyspnea 07/15/2015  . Encounter for antineoplastic chemotherapy 09/11/2015  . Encounter for therapeutic drug monitoring 09/11/2015  . Essential hypertension 12/29/2006  Echocardiogram (07/26/13): Left ventricle: The cavity size was normal. Wall thickness was increased in a pattern of mild LVH. Systolic function was normal. The estimated ejection fraction was in the range of 55% to 60%. Wall motion was normal; there were no regional wall motion abnormalities. Normal mitral valve.    . Falls frequently 08/02/2015  . Family history of Huntington's disease   . Family history of trichomonal vaginitis 05/2005  . Fatigue 02/06/2013  .  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 Fibrocystic breast changes   . GERD 12/29/2006   Qualifier: Diagnosis of  By: Girard Cooter MD, MAKEECHA    . GERD (gastroesophageal reflux disease)   . History of colon cancer 05/31/2013  . Hypercholesterolemia   . HYPERCHOLESTEROLEMIA 12/29/2006   Qualifier: Diagnosis of  By: Girard Cooter MD, MAKEECHA    . Hyperlipidemia   . Hypertension   . Hypokalemia 08/09/2013  . Insomnia 10/05/2011  . Left-sided low back pain with left-sided sciatica 07/18/2014  . Low back pain with sciatica 06/22/2014  . Lung cancer ([place]) 06/16/10   PET scan 04/28/2010; primary: increase in size 02/2010 / Well Differentiated Adenocarcinoma of the lung   . Lung nodule    FNA ordered for 04/02/10 by HA>pos Ca  . LVH (left ventricular hypertrophy) 12/20/2014   Echocardiogram (07/26/13): Left ventricle: Wall thickness was increased in a pattern of mild LVH. The cavity size was normal. Systolic function was normal. The estimated ejection fraction was in the range of 55% to 60%. Wall motion was normal; there were no regional wall motion abnormalities. Normal mitral valve.   . On antineoplastic chemotherapy started 02/2012   [place]  . Other fatigue 11/28/2014  . Pleural effusion 05/03/2013  . 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 Postmenopausal   . POSTMENOPAUSAL SYNDROME 01/31/2009   Qualifier: Diagnosis of  By: Carlena Sax  MD, [place]ta [place]    . Postural dizziness 06/19/2015  . Protein-calorie malnutrition, moderate ([place]) 09/11/2015  . S/P radiation therapy 03/01/12   SRS: 1 fraction / 20 Gray each to the Left Occipital Region and to the Right Insular Metastases  . Sensorineural hearing loss (SNHL) of both ears 04/23/2016   Dr Keturah Barre. Redmond Baseman (ENT) dx significant bilateral hearing loss and recommended biaural hearing aids.   . Shortness of breath   . Skin rash 10/03/2015  . Status post chemotherapy comp. 12/29/11   Carboplatin/Taxol  . Status post radiation therapy 11/16/11 -  12/29/11   Right Lung and Mediastinum: 60 Gy  . Status post stereotactic radiosurgery 01/2012   for Brain Metastases  . Tobacco dependence   . Vitamin D deficiency 08/30/2015  . Weight loss 10/22/2011    Past Surgical History:  Procedure Laterality Date  . BACK SURGERY     Dr Luiz Ochoa  . BREAST SURGERY     Bil lumpectomy  . cardiac cath x3    . CHEST TUBE INSERTION Right 06/12/2013   Procedure: INSERTION PLEURAL DRAINAGE CATHETER;  Surgeon: Gaye Pollack, MD;  Location: [facility];  Service: Thoracic;  Laterality: Right;  . COLECTOMY  03/22/07   Stage 1 pT2 N0, M0 Adenocarcinoma of the sigmoid  colon  . HERNIA REPAIR    . LUNG LOBECTOMY  06/16/10   Left Upper Lobectomy  . MEDIASTINOSCOPY  10/19/2011   Procedure: MEDIASTINOSCOPY;  Surgeon: Gaye Pollack, MD;  Location: [facility] OR;  Service: Thoracic;  Laterality: N/A;  . TALC PLEURODESIS Right 06/12/2013   Procedure: Pietro Cassis;  Surgeon: Gaye Pollack,  MD;  Location: [facility];  Service: Thoracic;  Laterality: Right;  . TUBAL LIGATION    . TUNNELED VENOUS CATHETER PLACEMENT     Port-a-Cath    Current Outpatient Prescriptions  Medication Sig Dispense Refill  . albuterol (PROVENTIL HFA;VENTOLIN HFA) 108 (90 Base) MCG/ACT inhaler Inhale 1 puff into the lungs 2 (two) times daily. 6.7 g 6  . aspirin 81 MG chewable tablet Chew 81 mg by mouth daily.    Marland Kitchen azithromycin (ZITHROMAX) 250 MG tablet Take 2 tablets (500 mg) on  Day 1,  followed by 1 tablet (250 mg) once daily on Days 2 through 5. 6 each 0  . BYSTOLIC 10 MG tablet TAKE 1 TABLET (10 MG TOTAL) BY MOUTH DAILY. 90 tablet 3  . CAPSAICIN EX Apply topically as directed. To feet     . cholecalciferol (VITAMIN D) 1000 units tablet Take 1,000 Units by mouth daily.    Marland Kitchen COLACE 100 MG capsule Take 100 mg by mouth daily.    . diclofenac sodium (VOLTAREN) 1 % GEL Apply 2 g topically 4 (four) times daily. As needed for pain 100 g 3  . gabapentin (NEURONTIN) 300 MG capsule Take 1 capsule (300 mg total) by  mouth 3 (three) times daily. (Patient taking differently: Take 300 mg by mouth daily. ) 90 capsule 1  . HYDROmorphone (DILAUDID) 2 MG tablet Take 1 tablet (2 mg total) by mouth every 6 (six) hours as needed for severe pain. 30 tablet 0  . lidocaine-prilocaine (EMLA) cream Apply topically as needed. Apply to porta cath site one hour prior to needle stick. 30 g 6  . methylPREDNISolone (MEDROL DOSEPAK) 4 MG TBPK tablet Take as directed. 21 tablet 0  . omeprazole (PRILOSEC) 40 MG capsule Take 40 mg by mouth daily.    . ondansetron (ZOFRAN ODT) 4 MG disintegrating tablet Take 1 tablet (4 mg total) by mouth every 8 (eight) hours as needed for nausea or vomiting. 20 tablet 0  . potassium chloride SA (K-DUR,KLOR-CON) 20 MEQ tablet Take 20 mEq by mouth daily.     . pravastatin (PRAVACHOL) 40 MG tablet Take 40 mg by mouth daily.    Marland Kitchen tiZANidine (ZANAFLEX) 2 MG tablet Take 1 tablet (2 mg total) by mouth every 6 (six) hours as needed (Back Muscle Spasm). 30 tablet 3  . traMADol (ULTRAM) 50 MG tablet Take 1 tablet (50 mg total) by mouth every 6 (six) hours as needed for moderate pain or severe pain. 90 tablet 1  . umeclidinium-vilanterol (ANORO ELLIPTA) 62.5-25 MCG/INH AEPB Inhale 1 puff into the lungs daily. 3 each 3  . nitroGLYCERIN (NITROSTAT) 0.4 MG SL tablet Place 1 tablet (0.4 mg total) under the tongue every 5 (five) minutes as needed. For chest pain (Patient not taking: Reported on 06/09/2016) 25 tablet 5   No current facility-administered medications for this visit.    Facility-Administered Medications Ordered in Other Visits  Medication Dose Route Frequency Provider Last Rate Last Dose  . sodium chloride 0.9 % injection 10 mL  10 mL Intravenous PRN Heath Lark, MD   10 mL at 09/18/15 1345  . sodium chloride 0.9 % injection 10 mL  10 mL Intravenous PRN Heath Lark, MD   10 mL at 04/24/16 1136    Allergies as of 06/09/2016 - Review Complete 06/09/2016  Allergen Reaction Noted  . Adhesive [tape] Rash  06/08/2013  . Lisinopril Cough 12/29/2006    Family History  Problem Relation Age of Onset  . Stomach cancer Maternal  Aunt   . Osteoarthritis Father   . Gout Father   . Hypertension Father   . Heart disease Mother     pericarditis;   . Breast cancer Cousin   . Cancer Sister     Lymphatic  . Huntington's disease Son   . Huntington's disease Son   . Anesthesia problems Neg Hx     Social History   Social History  . Marital status: Widowed    Spouse name: N/A  . Number of children: 3  . Years of education: 12   Occupational History  . retired-graphic Engineer, civil (consulting) Unemployed   Social History Main Topics  . Smoking status: Current Every Day Smoker    Packs/day: 0.75    Types: Cigarettes    Start date: 05/18/1965  . Smokeless tobacco: Never Used  . Alcohol use 0.0 oz/week     Comment: occasional  . Drug use: No  . Sexual activity: No   Other Topics Concern  . Not on file   Social History Narrative   Health Care POA:    Emergency Contact: daughter, Catalina Antigua. Rosana Hoes, [phone]   End of Life Plan: filed in chart   Who lives with you: dgt, and son with Huntington's Disease   HCPOA document scanned into EMR (T. McDiarmid, MD)   Any pets: none   Diet: patient has a varied diet.   Exercise: pt does not have a regular exercise routine.   Seatbelts: Pt reports wearing seatbelt when in vehicles.    Hobbies: listening to music   Social:Widowed for more than 10 years and living in [place]. Patient has 3 adult children: 1 daughter [name]) and two sons; (one living in [facility] with Huntington's Disease and another son in [place], [place] with possible Huntington's Disease.   Patient is independent with ADLs. Walks with a cane as needed only.   Transportation: owns a car and still driving.    Caregiver and Emergency contact: Daughter, [name], [phone]    DME: [name], [employer]   Advance Directive: Yes, LW and HCPOA. Patient  wishes to be cremated     Physical Exam: BP 134/72 (BP Location: Left Arm, Patient Position: Sitting, Cuff Size: Normal)   Pulse 96   Ht '5\' 4"'$  (1.626 m)   Wt 159 lb (72.1 kg)   BMI 27.29 kg/m  ConsWeek appearing, walks with a canechiatric: alert and oriented x3 Eyes: extraocular movements intact Mouth: oral pharynx moist, no lesions Neck: supple no lymphadenopathy Cardiovascular: heart regular rate and rhythm Lungs: clear to auscultation bilaterally Abdomen: soft, nontender, nondistended, no obvious ascites, no peritoneal signs, normal bowel sounds Extremities: no lower extremity edema bilaterally Skin: no lesions on visible extremities   Assessment and plan: 66 y.o. 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  She seemed relieved to hear this. I did explain to her that if she has any significant new GI symptoms I'm happy to hear from her and testing can be done if that is the case.  Please see the "Patient Instructions" section for addition details about the plan.    Owens Loffler, MD [facility] 06/09/2016, 9:49 AM  Cc: Heath Lark, MD

## 2016-06-09 NOTE — Patient Instructions (Signed)
No colonoscopy for colon cancer screening or polyps surveillance. If you have new GI symptoms (serious bleeidng, abd pains, change in your bowels) then please call.

## 2016-06-12 ENCOUNTER — Ambulatory Visit (HOSPITAL_BASED_OUTPATIENT_CLINIC_OR_DEPARTMENT_OTHER): Payer: Medicare HMO

## 2016-06-12 ENCOUNTER — Other Ambulatory Visit (HOSPITAL_BASED_OUTPATIENT_CLINIC_OR_DEPARTMENT_OTHER): Payer: Medicare HMO

## 2016-06-12 ENCOUNTER — Ambulatory Visit: Payer: Medicare HMO

## 2016-06-12 VITALS — BP 146/73 | HR 70 | Temp 98.3°F | Resp 18

## 2016-06-12 DIAGNOSIS — C7931 Secondary malignant neoplasm of brain: Secondary | ICD-10-CM | POA: Diagnosis not present

## 2016-06-12 DIAGNOSIS — C3412 Malignant neoplasm of upper lobe, left bronchus or lung: Secondary | ICD-10-CM | POA: Diagnosis not present

## 2016-06-12 DIAGNOSIS — Z79899 Other long term (current) drug therapy: Secondary | ICD-10-CM

## 2016-06-12 DIAGNOSIS — Z5181 Encounter for therapeutic drug level monitoring: Secondary | ICD-10-CM

## 2016-06-12 DIAGNOSIS — Z5111 Encounter for antineoplastic chemotherapy: Secondary | ICD-10-CM

## 2016-06-12 DIAGNOSIS — C7949 Secondary malignant neoplasm of other parts of nervous system: Principal | ICD-10-CM

## 2016-06-12 LAB — COMPREHENSIVE METABOLIC PANEL
ALBUMIN: 3.1 g/dL — AB (ref 3.5–5.0)
ALK PHOS: 132 U/L (ref 40–150)
ALT: 11 U/L (ref 0–55)
AST: 13 U/L (ref 5–34)
Anion Gap: 9 mEq/L (ref 3–11)
BILIRUBIN TOTAL: 0.39 mg/dL (ref 0.20–1.20)
BUN: 11.7 mg/dL (ref 7.0–26.0)
CALCIUM: 9 mg/dL (ref 8.4–10.4)
CO2: 25 mEq/L (ref 22–29)
CREATININE: 1.1 mg/dL (ref 0.6–1.1)
Chloride: 110 mEq/L — ABNORMAL HIGH (ref 98–109)
EGFR: 60 mL/min/{1.73_m2} — ABNORMAL LOW (ref >90)
Glucose: 94 mg/dl (ref 70–140)
POTASSIUM: 4.1 meq/L (ref 3.5–5.1)
Sodium: 145 mEq/L (ref 136–145)
Total Protein: 7 g/dL (ref 6.4–8.3)

## 2016-06-12 LAB — CBC WITH DIFFERENTIAL/PLATELET
BASO%: 0.5 % (ref 0.0–2.0)
BASOS ABS: 0 10*3/uL (ref 0.0–0.1)
EOS%: 0.1 % (ref 0.0–7.0)
Eosinophils Absolute: 0 10*3/uL (ref 0.0–0.5)
HEMATOCRIT: 32.2 % — AB (ref 34.8–46.6)
HEMOGLOBIN: 10.2 g/dL — AB (ref 11.6–15.9)
LYMPH%: 16.1 % (ref 14.0–49.7)
MCH: 24.1 pg — AB (ref 25.1–34.0)
MCHC: 31.7 g/dL (ref 31.5–36.0)
MCV: 75.8 fL — ABNORMAL LOW (ref 79.5–101.0)
MONO#: 0.7 10*3/uL (ref 0.1–0.9)
MONO%: 15.4 % — ABNORMAL HIGH (ref 0.0–14.0)
NEUT#: 3.2 10*3/uL (ref 1.5–6.5)
NEUT%: 67.9 % (ref 38.4–76.8)
Platelets: 220 10*3/uL (ref 145–400)
RBC: 4.25 10*6/uL (ref 3.70–5.45)
RDW: 17.7 % — ABNORMAL HIGH (ref 11.2–14.5)
WBC: 4.8 10*3/uL (ref 3.9–10.3)
lymph#: 0.8 10*3/uL — ABNORMAL LOW (ref 0.9–3.3)

## 2016-06-12 LAB — TSH: TSH: 0.866 m[IU]/L (ref 0.308–3.960)

## 2016-06-12 MED ORDER — SODIUM CHLORIDE 0.9 % IV SOLN
168.0000 mg | Freq: Once | INTRAVENOUS | Status: AC
  Administered 2016-06-12: 170 mg via INTRAVENOUS
  Filled 2016-06-12: qty 17

## 2016-06-12 MED ORDER — SODIUM CHLORIDE 0.9 % IV SOLN
Freq: Once | INTRAVENOUS | Status: AC
  Administered 2016-06-12: 14:00:00 via INTRAVENOUS

## 2016-06-12 MED ORDER — SODIUM CHLORIDE 0.9 % IV SOLN
800.0000 mg/m2 | Freq: Once | INTRAVENOUS | Status: AC
  Administered 2016-06-12: 1482 mg via INTRAVENOUS
  Filled 2016-06-12: qty 38.98

## 2016-06-12 MED ORDER — CARBOPLATIN CHEMO INTRADERMAL TEST DOSE 100MCG/0.02ML
100.0000 ug | Freq: Once | INTRADERMAL | Status: AC
  Administered 2016-06-12: 100 ug via INTRADERMAL
  Filled 2016-06-12: qty 0.02

## 2016-06-12 MED ORDER — PALONOSETRON HCL INJECTION 0.25 MG/5ML
0.2500 mg | Freq: Once | INTRAVENOUS | Status: AC
  Administered 2016-06-12: 0.25 mg via INTRAVENOUS

## 2016-06-12 MED ORDER — DEXAMETHASONE SODIUM PHOSPHATE 10 MG/ML IJ SOLN
10.0000 mg | Freq: Once | INTRAMUSCULAR | Status: AC
  Administered 2016-06-12: 10 mg via INTRAVENOUS

## 2016-06-12 MED ORDER — PALONOSETRON HCL INJECTION 0.25 MG/5ML
INTRAVENOUS | Status: AC
  Filled 2016-06-12: qty 5

## 2016-06-12 MED ORDER — SODIUM CHLORIDE 0.9 % IJ SOLN
10.0000 mL | INTRAMUSCULAR | Status: DC | PRN
  Administered 2016-06-12: 10 mL via INTRAVENOUS
  Filled 2016-06-12: qty 10

## 2016-06-12 MED ORDER — HEPARIN SOD (PORK) LOCK FLUSH 100 UNIT/ML IV SOLN
500.0000 [IU] | Freq: Once | INTRAVENOUS | Status: AC | PRN
  Administered 2016-06-12: 500 [IU]
  Filled 2016-06-12: qty 5

## 2016-06-12 MED ORDER — DEXAMETHASONE SODIUM PHOSPHATE 10 MG/ML IJ SOLN
INTRAMUSCULAR | Status: AC
  Filled 2016-06-12: qty 1

## 2016-06-12 MED ORDER — SODIUM CHLORIDE 0.9% FLUSH
10.0000 mL | INTRAVENOUS | Status: DC | PRN
  Administered 2016-06-12: 10 mL
  Filled 2016-06-12: qty 10

## 2016-06-12 NOTE — Patient Instructions (Addendum)
[facility] Discharge Instructions for Patients Receiving Chemotherapy  Today you received the following chemotherapy agents: Gemzar  and Carboplatin   To help prevent nausea and vomiting after your treatment, we encourage you to take your nausea medication: As directed.    If you develop nausea and vomiting that is not controlled by your nausea medication, call the clinic.   BELOW ARE SYMPTOMS THAT SHOULD BE REPORTED IMMEDIATELY:  *FEVER GREATER THAN 100.5 F  *CHILLS WITH OR WITHOUT FEVER  NAUSEA AND VOMITING THAT IS NOT CONTROLLED WITH YOUR NAUSEA MEDICATION  *UNUSUAL SHORTNESS OF BREATH  *UNUSUAL BRUISING OR BLEEDING  TENDERNESS IN MOUTH AND THROAT WITH OR WITHOUT PRESENCE OF ULCERS  *URINARY PROBLEMS  *BOWEL PROBLEMS  UNUSUAL RASH Items with * indicate a potential emergency and should be followed up as soon as possible.  Feel free to call the clinic you have any questions or concerns. The clinic phone number is [phone].  Please show the Plaza at check-in to the Emergency Department and triage nurse.

## 2016-06-15 ENCOUNTER — Telehealth: Payer: Self-pay | Admitting: Pulmonary Disease

## 2016-06-15 MED ORDER — HYDROCOD POLST-CPM POLST ER 10-8 MG/5ML PO SUER: 5.0000 mL | Freq: Two times a day (BID) | ORAL | 0 refills | Status: DC | PRN

## 2016-06-15 NOTE — Telephone Encounter (Signed)
Per SN: Tussionex 6oz 1 tsp po tid prn for cough.   -------------------------------  Called and spoke to pt. Informed her of the recs per SN. Rx printed and placed up front for pick up. Pt verbalized understanding and denied any further questions or concerns at this time.

## 2016-06-15 NOTE — Telephone Encounter (Signed)
Per SN: Recs >> #1, #2, and #3 quit smoking.  #4 Hydromet 8oz, 1 tsp PO q4h prn.   --------------------------  Called and spoke to pt. Informed her of the recs per SN. Pt states she does not like Hydromet because it makes her 'feel funny'. Pt is requesting something else to help with the cough.   Dr. Lenna Gilford, please advise. Thank you.

## 2016-06-15 NOTE — Telephone Encounter (Signed)
Spoke with pt. States that she still having issues with coughing. Reports cough and wheezing. Cough is producing clear mucus. Denies chest tightness, SOB or fever. Symptoms started over 2 weeks ago. Was given Zpack and Medrol Dose Pack last week. Pt is still smoking 0.25-0.5 pack per day. Would like SN's recommendations.  SN - please advise. Thanks.  Allergies  Allergen Reactions  . Adhesive [Tape] Rash  . Lisinopril Cough

## 2016-06-16 ENCOUNTER — Telehealth: Payer: Self-pay | Admitting: Pulmonary Disease

## 2016-06-16 NOTE — Telephone Encounter (Signed)
Found rx will give to pt. Nothing further is needed

## 2016-06-16 NOTE — Telephone Encounter (Signed)
Spoke with the pt  She states that she tried to get tussionex filled and she was told that it was not covered  She states that they gave her delsym otc instead  I advised to have her call if needed if this is not helping with the cough  She verbalized understanding and nothing further needed

## 2016-06-19 ENCOUNTER — Encounter: Payer: Self-pay | Admitting: Hematology and Oncology

## 2016-06-19 ENCOUNTER — Ambulatory Visit (HOSPITAL_BASED_OUTPATIENT_CLINIC_OR_DEPARTMENT_OTHER): Payer: Medicare HMO | Admitting: Hematology and Oncology

## 2016-06-19 ENCOUNTER — Ambulatory Visit: Payer: Medicare HMO

## 2016-06-19 ENCOUNTER — Telehealth: Payer: Self-pay | Admitting: Hematology and Oncology

## 2016-06-19 ENCOUNTER — Ambulatory Visit (HOSPITAL_BASED_OUTPATIENT_CLINIC_OR_DEPARTMENT_OTHER): Payer: Medicare HMO

## 2016-06-19 ENCOUNTER — Other Ambulatory Visit (HOSPITAL_BASED_OUTPATIENT_CLINIC_OR_DEPARTMENT_OTHER): Payer: Medicare HMO

## 2016-06-19 VITALS — BP 142/74 | HR 82 | Temp 98.6°F | Resp 18

## 2016-06-19 VITALS — BP 141/74 | HR 100 | Temp 98.3°F | Resp 17 | Ht 64.0 in | Wt 156.3 lb

## 2016-06-19 DIAGNOSIS — E440 Moderate protein-calorie malnutrition: Secondary | ICD-10-CM | POA: Diagnosis not present

## 2016-06-19 DIAGNOSIS — C3412 Malignant neoplasm of upper lobe, left bronchus or lung: Secondary | ICD-10-CM

## 2016-06-19 DIAGNOSIS — J449 Chronic obstructive pulmonary disease, unspecified: Secondary | ICD-10-CM

## 2016-06-19 DIAGNOSIS — Z79899 Other long term (current) drug therapy: Secondary | ICD-10-CM | POA: Diagnosis not present

## 2016-06-19 DIAGNOSIS — F17200 Nicotine dependence, unspecified, uncomplicated: Secondary | ICD-10-CM

## 2016-06-19 DIAGNOSIS — Z720 Tobacco use: Secondary | ICD-10-CM

## 2016-06-19 DIAGNOSIS — Z5181 Encounter for therapeutic drug level monitoring: Secondary | ICD-10-CM

## 2016-06-19 DIAGNOSIS — Z5111 Encounter for antineoplastic chemotherapy: Secondary | ICD-10-CM

## 2016-06-19 DIAGNOSIS — R634 Abnormal weight loss: Secondary | ICD-10-CM

## 2016-06-19 DIAGNOSIS — D61818 Other pancytopenia: Secondary | ICD-10-CM | POA: Diagnosis not present

## 2016-06-19 LAB — CBC WITH DIFFERENTIAL/PLATELET
BASO%: 1.9 % (ref 0.0–2.0)
BASOS ABS: 0 10*3/uL (ref 0.0–0.1)
EOS ABS: 0 10*3/uL (ref 0.0–0.5)
EOS%: 0.6 % (ref 0.0–7.0)
HEMATOCRIT: 28.9 % — AB (ref 34.8–46.6)
HEMOGLOBIN: 9.5 g/dL — AB (ref 11.6–15.9)
LYMPH#: 0.5 10*3/uL — AB (ref 0.9–3.3)
LYMPH%: 33.8 % (ref 14.0–49.7)
MCH: 24.1 pg — ABNORMAL LOW (ref 25.1–34.0)
MCHC: 32.9 g/dL (ref 31.5–36.0)
MCV: 73.4 fL — AB (ref 79.5–101.0)
MONO#: 0.3 10*3/uL (ref 0.1–0.9)
MONO%: 20.8 % — ABNORMAL HIGH (ref 0.0–14.0)
NEUT#: 0.7 10*3/uL — ABNORMAL LOW (ref 1.5–6.5)
NEUT%: 42.9 % (ref 38.4–76.8)
Platelets: 267 10*3/uL (ref 145–400)
RBC: 3.94 10*6/uL (ref 3.70–5.45)
RDW: 19.6 % — ABNORMAL HIGH (ref 11.2–14.5)
WBC: 1.5 10*3/uL — ABNORMAL LOW (ref 3.9–10.3)

## 2016-06-19 LAB — TSH: TSH: 1.781 m[IU]/L (ref 0.308–3.960)

## 2016-06-19 LAB — COMPREHENSIVE METABOLIC PANEL
ALBUMIN: 3 g/dL — AB (ref 3.5–5.0)
ALK PHOS: 116 U/L (ref 40–150)
ALT: 19 U/L (ref 0–55)
ANION GAP: 9 meq/L (ref 3–11)
AST: 16 U/L (ref 5–34)
BUN: 9.1 mg/dL (ref 7.0–26.0)
CALCIUM: 8.9 mg/dL (ref 8.4–10.4)
CHLORIDE: 106 meq/L (ref 98–109)
CO2: 26 mEq/L (ref 22–29)
Creatinine: 1.1 mg/dL (ref 0.6–1.1)
EGFR: 59 mL/min/{1.73_m2} — AB (ref >90)
Glucose: 104 mg/dl (ref 70–140)
POTASSIUM: 3.6 meq/L (ref 3.5–5.1)
Sodium: 140 mEq/L (ref 136–145)
Total Bilirubin: 0.45 mg/dL (ref 0.20–1.20)
Total Protein: 6.8 g/dL (ref 6.4–8.3)

## 2016-06-19 MED ORDER — PREDNISONE 50 MG PO TABS: 50.0000 mg | ORAL_TABLET | Freq: Every day | ORAL | 0 refills | Status: DC

## 2016-06-19 MED ORDER — SODIUM CHLORIDE 0.9 % IV SOLN
Freq: Once | INTRAVENOUS | Status: AC
  Administered 2016-06-19: 11:00:00 via INTRAVENOUS

## 2016-06-19 MED ORDER — SODIUM CHLORIDE 0.9 % IJ SOLN
10.0000 mL | INTRAMUSCULAR | Status: DC | PRN
  Administered 2016-06-19: 10 mL via INTRAVENOUS
  Filled 2016-06-19: qty 10

## 2016-06-19 MED ORDER — SODIUM CHLORIDE 0.9% FLUSH
10.0000 mL | INTRAVENOUS | Status: DC | PRN
  Administered 2016-06-19: 10 mL
  Filled 2016-06-19: qty 10

## 2016-06-19 MED ORDER — HEPARIN SOD (PORK) LOCK FLUSH 100 UNIT/ML IV SOLN
500.0000 [IU] | Freq: Once | INTRAVENOUS | Status: AC | PRN
  Administered 2016-06-19: 500 [IU]
  Filled 2016-06-19: qty 5

## 2016-06-19 MED FILL — predniSONE 50 MG TABS: 50 | 7 days supply | Qty: 7 | Fill #0

## 2016-06-19 NOTE — Assessment & Plan Note (Signed)
This is due to recent chemotherapy. She does not need blood transfusion. I will hold treatment today due to multiple symptoms.

## 2016-06-19 NOTE — Telephone Encounter (Signed)
Appointments scheduled per 2/2 LOS. Patient had to go to infusion during scheduling. She will pick up printouts and reports in infusion room.

## 2016-06-19 NOTE — Assessment & Plan Note (Signed)
She has recent weight loss due to poor appetite. Hopefully the steroid treatment will  improve her appetite. I will hold chemotherapy as above

## 2016-06-19 NOTE — Assessment & Plan Note (Signed)
She is not feeling well. She felt that recent chemotherapy is making her sick. She has no quality of life. Due to neutropenia, I recommend we hold treatment. She appears somewhat dehydrated and I would give her some IV fluids only. I plan to repeat CT imaging study for objective assessment to see if she is responding to treatment or not. The patient is thinking about stopping chemotherapy. I will see her back again in 2 weeks for further assessment and review of imaging study

## 2016-06-19 NOTE — Patient Instructions (Signed)
Dehydration, Adult Dehydration is when there is not enough fluid or water in your body. This happens when you lose more fluids than you take in. Dehydration can range from mild to very bad. It should be treated right away to keep it from getting very bad. Symptoms of mild dehydration may include:   Thirst.  Dry lips.  Slightly dry mouth.  Dry, warm skin.  Dizziness. Symptoms of moderate dehydration may include:   Very dry mouth.  Muscle cramps.  Dark pee (urine). Pee may be the color of tea.  Your body making less pee.  Your eyes making fewer tears.  Heartbeat that is uneven or faster than normal (palpitations).  Headache.  Light-headedness, especially when you stand up from sitting.  Fainting (syncope). Symptoms of very bad dehydration may include:   Changes in skin, such as:  Cold and clammy skin.  Blotchy (mottled) or pale skin.  Skin that does not quickly return to normal after being lightly pinched and let go (poor skin turgor).  Changes in body fluids, such as:  Feeling very thirsty.  Your eyes making fewer tears.  Not sweating when body temperature is high, such as in hot weather.  Your body making very little pee.  Changes in vital signs, such as:  Weak pulse.  Pulse that is more than 100 beats a minute when you are sitting still.  Fast breathing.  Low blood pressure.  Other changes, such as:  Sunken eyes.  Cold hands and feet.  Confusion.  Lack of energy (lethargy).  Trouble waking up from sleep.  Short-term weight loss.  Unconsciousness. Follow these instructions at home:  If told by your doctor, drink an ORS:  Make an ORS by using instructions on the package.  Start by drinking small amounts, about  cup (120 mL) every 5-10 minutes.  Slowly drink more until you have had the amount that your doctor said to have.  Drink enough clear fluid to keep your pee clear or pale yellow. If you were told to drink an ORS, finish the  ORS first, then start slowly drinking clear fluids. Drink fluids such as:  Water. Do not drink only water by itself. Doing that can make the salt (sodium) level in your body get too low (hyponatremia).  Ice chips.  Fruit juice that you have added water to (diluted).  Low-calorie sports drinks.  Avoid:  Alcohol.  Drinks that have a lot of sugar. These include high-calorie sports drinks, fruit juice that does not have water added, and soda.  Caffeine.  Foods that are greasy or have a lot of fat or sugar.  Take over-the-counter and prescription medicines only as told by your doctor.  Do not take salt tablets. Doing that can make the salt level in your body get too high (hypernatremia).  Eat foods that have minerals (electrolytes). Examples include bananas, oranges, potatoes, tomatoes, and spinach.  Keep all follow-up visits as told by your doctor. This is important. Contact a doctor if:  You have belly (abdominal) pain that:  Gets worse.  Stays in one area (localizes).  You have a rash.  You have a stiff neck.  You get angry or annoyed more easily than normal (irritability).  You are more sleepy than normal.  You have a harder time waking up than normal.  You feel:  Weak.  Dizzy.  Very thirsty.  You have peed (urinated) only a small amount of very dark pee during 6-8 hours. Get help right away if:  You   have symptoms of very bad dehydration.  You cannot drink fluids without throwing up (vomiting).  Your symptoms get worse with treatment.  You have a fever.  You have a very bad headache.  You are throwing up or having watery poop (diarrhea) and it:  Gets worse.  Does not go away.  You have blood or something green (bile) in your throw-up.  You have blood in your poop (stool). This may cause poop to look black and tarry.  You have not peed in 6-8 hours.  You pass out (faint).  Your heart rate when you are sitting still is more than 100 beats a  minute.  You have trouble breathing. This information is not intended to replace advice given to you by your health care provider. Make sure you discuss any questions you have with your health care provider. Document Released: 02/28/2009 Document Revised: 11/22/2015 Document Reviewed: 06/28/2015 Elsevier Interactive Patient Education  2017 [employer]  

## 2016-06-19 NOTE — Progress Notes (Signed)
[facility] OFFICE PROGRESS NOTE  Patient Care Team: [name], MD as PCP - General (Family Medicine) Gaye Pollack, MD (Cardiothoracic Surgery) Dickie La, MD (Family Medicine) Heath Lark, MD as Consulting Physician (Hematology and Oncology) Verlin Grills, RN as [facility] Leighton Ruff, [place] (Optometry) Milus Banister, MD as Consulting Physician (Gastroenterology) Melida Quitter, MD as Consulting Physician (Otolaryngology)  SUMMARY OF ONCOLOGIC HISTORY: Oncology History   Colon cancer   Primary site: Colon and Rectum (Left)   Staging method: AJCC 7th Edition   Clinical: Stage I (T2, N0, M0) signed by Heath Lark, MD on 05/31/2013  2:39 PM   Pathologic: Stage I (T2, N0, cM0) signed by Heath Lark, MD on 05/31/2013  2:39 PM   Summary: Stage I (T2, N0, cM0) Lung cancer, EGFR/ALK negative, recurrence after initial resection to LN and brain   Primary site: Lung (Left)   Staging method: AJCC 7th Edition   Clinical: Stage IV (T1, N2, M1b) signed by Heath Lark, MD on 05/31/2013  2:26 PM   Pathologic: Stage IV (T1, N2, M1b) signed by Heath Lark, MD on 05/31/2013  2:26 PM   Summary: Stage IV (T1, N2, M1b)       Cancer of upper lobe of left lung, Adenocarcinoma   03/01/2007 Procedure    Colonoscopy revealed abnormalities and biopsy show high-grade dysplasia      04/01/2007 Surgery    She underwent sigmoid resection which showed T2 N0 colon cancer, and negative margins and all of 17 lymph nodes were negative      02/29/2008 Procedure    Repeat surveillance colonoscopy was negative.      06/16/2010 Surgery    She underwent left upper lobectomy we show well-differentiated adenocarcinoma of the lung, T1, N0, M0      03/18/2011 Procedure    Repeat colonoscopy show multiple polyps but there were benign      10/19/2011 Procedure    Biopsy of mediastinal lymph node came back positive for recurrence of lung cancer, EGFR and ALK  negative      11/16/2011 - 12/14/2011 Chemotherapy    She received concurrent chemoradiation therapy with weekly carboplatin and Taxol.      11/16/2011 - 12/29/2011 Radiation Therapy    She received radiation therapy with weekly chemotherapy      02/15/2012 Imaging    MR of the brain showed a new intracranial metastases. This was subsequently treated with stereotactic radiosurgery.      03/07/2012 - 04/12/2013 Chemotherapy    She received chemotherapy with maintainence Alimta every 3 weeks. Chemotherapy was discontinued due to profound fatigue      03/02/2013 Procedure    She had therapeutic ultrasound guidance thoracentesis for pleural effusion that came back negative for cancer      05/04/2013 Procedure    She had repeat ultrasound-guided thoracentesis again and cytology was negative      05/29/2013 Imaging    Repeat CT scan of the chest, abdomen and pelvis show no evidence of disease but persistent right-sided pleural effusion      06/02/2013 Surgery    The patient had placement of Pleurx catheter and subsequently underwent pleurodesis.      09/22/2013 Imaging    Repeat CT scan show no evidence of active disease. There are nonspecific lymphadenopathy and she is placed on observation.      03/23/2014 Imaging    Repeat CT scan of the chest, abdomen and pelvis show recurrence of cancer  with widespread bilateral pulmonary metastasis.      05/14/2014 Imaging    Imaging of the chest and brain were repeated due to delay of initiation of chemotherapy. Overall, chest CT scan show stable disease. MRI of the head was negative for recurrence      05/16/2014 - 07/18/2014 Chemotherapy     she completed 4 cycles of combination chemotherapy with carboplatin and Alimta      07/16/2014 Imaging     repeat CT scan of the chest, abdomen and pelvis show regression in the size of lung nodules.      08/29/2014 - 08/14/2015 Chemotherapy    She received maintenance Alimta      10/16/2014 Imaging      repeat CT scan of the chest, abdomen and pelvis show regression in the size of lung nodules.      01/29/2015 Imaging    Repeat CT scan showed stable disease      02/01/2015 Imaging    MRI brain is negative      04/30/2015 Imaging    CT scan of the chest, abdomen and pelvis showed stable disease      07/25/2015 Imaging    MRI brain showed no evidence of new disease      09/09/2015 Imaging    CT chest showed interval increase in size of large RIGHT upper lobe nodule is most consistent with lung cancer recurrence.      09/18/2015 - 03/26/2016 Chemotherapy    She started on Nivolumab      10/22/2015 Imaging    Screening mammogram showed mild abnormality      10/31/2015 Imaging    Diagnostic mammogram showed mild calcification at the right upper outer breast      11/27/2015 Imaging    Stable post treatment related changes of left lower lobectomy and radiation therapy redemonstrated, as above. No definite findings to suggest local recurrence of disease or metastatic disease on today's examination      01/24/2016 Imaging    MR brain showed continued stable appearance of two small treated brain metastases. No new or progressive metastatic disease to the brain.      04/15/2016 Imaging    Ct scan showed findings highly suspicious for recurrent tumor in the mediastinum surrounding the right mainstem bronchus and in the region of the azygoesophageal recess. Diffuse pulmonary metastatic disease with new and progressive nodules since the prior examination.      04/24/2016 -  Chemotherapy    The patient had chemotherapy with gemzar and Carboplatin      04/30/2016 Imaging    Decreased contrast enhancement of the two treated metastases in the right frontal and left occipital lobes. No new lesions.      06/01/2016 Imaging    CXR showed numerous metastatic nodules have progressed compared to the most recent comparison chest x-ray of 07/15/2015. Majority of these were noted on the 04/15/2016  chest CT. No segmental infiltrate or pulmonary edema.      06/19/2016 Adverse Reaction    Treatment is placed on hold today due to neutropenia and the patient not feeling well       Brain metastases treated with radiosurgery   02/26/2012 Initial Diagnosis    Brain metastases treated with radiosurgery       INTERVAL HISTORY: Please see below for problem oriented charting. She returns today for chemotherapy. She felt that chemotherapy is making her sick and she has no quality of life. She has excessive fatigue, feeling cold, mild productive cough, reduced  appetite with recent weight loss. She has persistent peripheral neuropathy. She denies pain. She has verbalized desire to quit chemotherapy She had no recent nausea or vomiting or changes in bowel habits She was recently evaluated by pulmonologist and was prescribed antibiotic therapy and Medrol pack. Chest x-ray showed possible disease progression  REVIEW OF SYSTEMS:   Constitutional: Denies fevers, chills  Eyes: Denies blurriness of vision Ears, nose, mouth, throat, and face: Denies mucositis or sore throat Cardiovascular: Denies palpitation, chest discomfort or lower extremity swelling Gastrointestinal:  Denies nausea, heartburn or change in bowel habits Skin: Denies abnormal skin rashes Lymphatics: Denies new lymphadenopathy or easy bruising Neurological:Denies numbness, tingling Behavioral/Psych: Mood is stable, no new changes  All other systems were reviewed with the patient and are negative.  I have reviewed the past medical history, past surgical history, social history and family history with the patient and they are unchanged from previous note.  ALLERGIES:  is allergic to adhesive [tape] and lisinopril.  MEDICATIONS:  Current Outpatient Prescriptions  Medication Sig Dispense Refill  . COLACE 100 MG capsule Take 100 mg by mouth daily as needed.     Marland Kitchen HYDROmorphone (DILAUDID) 2 MG tablet Take 1 tablet (2 mg total)  by mouth every 6 (six) hours as needed for severe pain. 30 tablet 0  . lidocaine-prilocaine (EMLA) cream Apply topically as needed. Apply to porta cath site one hour prior to needle stick. 30 g 6  . nitroGLYCERIN (NITROSTAT) 0.4 MG SL tablet Place 1 tablet (0.4 mg total) under the tongue every 5 (five) minutes as needed. For chest pain 25 tablet 5  . ondansetron (ZOFRAN ODT) 4 MG disintegrating tablet Take 1 tablet (4 mg total) by mouth every 8 (eight) hours as needed for nausea or vomiting. 20 tablet 0  . umeclidinium-vilanterol (ANORO ELLIPTA) 62.5-25 MCG/INH AEPB Inhale 1 puff into the lungs daily. 3 each 3  . predniSONE (DELTASONE) 50 MG tablet Take 1 tablet (50 mg total) by mouth daily with breakfast. 7 tablet 0   No current facility-administered medications for this visit.    Facility-Administered Medications Ordered in Other Visits  Medication Dose Route Frequency Provider Last Rate Last Dose  . sodium chloride 0.9 % injection 10 mL  10 mL Intravenous PRN Heath Lark, MD   10 mL at 09/18/15 1345  . sodium chloride 0.9 % injection 10 mL  10 mL Intravenous PRN Heath Lark, MD   10 mL at 04/24/16 1136    PHYSICAL EXAMINATION: ECOG PERFORMANCE STATUS: 2 - Symptomatic, <50% confined to bed  Vitals:   06/19/16 0948  BP: (!) 141/74  Pulse: 100  Resp: 17  Temp: 98.3 F (36.8 C)   Filed Weights   06/19/16 0948  Weight: 156 lb 4.8 oz (70.9 kg)    GENERAL:alert, no distress and comfortable. She is crying and appears depressed SKIN: skin color, texture, turgor are normal, no rashes or significant lesions EYES: normal, Conjunctiva are pink and non-injected, sclera clear OROPHARYNX:no exudate, no erythema and lips, buccal mucosa, and tongue normal  NECK: supple, thyroid normal size, non-tender, without nodularity LYMPH:  no palpable lymphadenopathy in the cervical, axillary or inguinal LUNGS: Noted bilateral wheezes with normal breathing effort HEART: regular rate & rhythm and no murmurs  and no lower extremity edema ABDOMEN:abdomen soft, non-tender and normal bowel sounds Musculoskeletal:no cyanosis of digits and no clubbing  NEURO: alert & oriented x 3 with fluent speech, no focal motor/sensory deficits  LABORATORY DATA:  I have reviewed the data as  listed    Component Value Date/Time   NA 140 06/19/2016 0920   K 3.6 06/19/2016 0920   CL 106 12/26/2013 0946   CL 102 10/24/2012 1001   CO2 26 06/19/2016 0920   GLUCOSE 104 06/19/2016 0920   GLUCOSE 111 (H) 10/24/2012 1001   BUN 9.1 06/19/2016 0920   CREATININE 1.1 06/19/2016 0920   CALCIUM 8.9 06/19/2016 0920   PROT 6.8 06/19/2016 0920   ALBUMIN 3.0 (L) 06/19/2016 0920   AST 16 06/19/2016 0920   ALT 19 06/19/2016 0920   ALKPHOS 116 06/19/2016 0920   BILITOT 0.45 06/19/2016 0920   GFRNONAA 74 (L) 06/12/2013 0625   GFRAA 86 (L) 06/12/2013 0625    No results found for: SPEP, UPEP  Lab Results  Component Value Date   WBC 1.5 (L) 06/19/2016   NEUTROABS 0.7 (L) 06/19/2016   HGB 9.5 (L) 06/19/2016   HCT 28.9 (L) 06/19/2016   MCV 73.4 (L) 06/19/2016   PLT 267 06/19/2016      Chemistry      Component Value Date/Time   NA 140 06/19/2016 0920   K 3.6 06/19/2016 0920   CL 106 12/26/2013 0946   CL 102 10/24/2012 1001   CO2 26 06/19/2016 0920   BUN 9.1 06/19/2016 0920   CREATININE 1.1 06/19/2016 0920      Component Value Date/Time   CALCIUM 8.9 06/19/2016 0920   ALKPHOS 116 06/19/2016 0920   AST 16 06/19/2016 0920   ALT 19 06/19/2016 0920   BILITOT 0.45 06/19/2016 0920       RADIOGRAPHIC STUDIES: I have personally reviewed the radiological images as listed and agreed with the findings in the report. Dg Chest 2 View  Result Date: 06/01/2016 CLINICAL DATA:  66 year old female with COPD and productive cough with shortness breath for 4 years. History of bilateral lung cancer post removal left upper lobe. Hypertension. Initial encounter. EXAM: CHEST  2 VIEW COMPARISON:  06/16/2015 chest CT.  07/15/2015  chest x-ray. FINDINGS: Post therapy changes greater right lung. Numerous metastatic lesions. It is difficult to determine if there has been a change in the number of pulmonary nodules when compared to prior chest CT of 04/15/2014 (secondary to differences in technique) at which time recurrent tumor surrounding the right mainstem bronchus and in the region of the as azygoesophageal recess was also noted. Metastatic lung nodules appear more prominent than on the most recent comparison chest x-ray of 07/15/2015. No segmental consolidation or pneumothorax. Central pulmonary vascular prominence without pulmonary edema. Scoliosis thoracic spine without focal compression fracture. Heart size within normal limits.  Slightly tortuous aorta. IMPRESSION: Numerous metastatic nodules have progressed compared to the most recent comparison chest x-ray of 07/15/2015. Majority of these were noted on the 04/15/2016 chest CT. No segmental infiltrate or pulmonary edema. Electronically Signed   By: Genia Del M.D.   On: 06/01/2016 13:25    ASSESSMENT & PLAN:  Cancer of upper lobe of left lung, Adenocarcinoma She is not feeling well. She felt that recent chemotherapy is making her sick. She has no quality of life. Due to neutropenia, I recommend we hold treatment. She appears somewhat dehydrated and I would give her some IV fluids only. I plan to repeat CT imaging study for objective assessment to see if she is responding to treatment or not. The patient is thinking about stopping chemotherapy. I will see her back again in 2 weeks for further assessment and review of imaging study  Pancytopenia, acquired [facility]) This is due  to recent chemotherapy. She does not need blood transfusion. I will hold treatment today due to multiple symptoms.  COPD mixed type [facility]) She has recurrent COPD exacerbation due to smoking. She just completed a course of antibiotic therapy. She still has some wheezes on exam. I recommend  steroid therapy for 1 week and nasal spray to reduce postnasal drip  Protein calorie malnutrition ([place]) She has recent weight loss due to poor appetite. Hopefully the steroid treatment will  improve her appetite. I will hold chemotherapy as above  TOBACCO ABUSE I spent some time counseling the patient the importance of tobacco cessation. She is currently not interested to quit now.   Orders Placed This Encounter  Procedures  . CT CHEST W CONTRAST    Standing Status:   Future    Standing Expiration Date:   07/24/2017    Order Specific Question:   Reason for exam:    Answer:   lung cancer, assess response to Rx    Order Specific Question:   Preferred imaging location?    Answer:   [facility]   All questions were answered. The patient knows to call the clinic with any problems, questions or concerns. No barriers to learning was detected. I spent 30 minutes counseling the patient face to face. The total time spent in the appointment was 55 minutes and more than 50% was on counseling and review of test results     Heath Lark, MD 06/19/2016 6:40 PM

## 2016-06-19 NOTE — Assessment & Plan Note (Signed)
She has recurrent COPD exacerbation due to smoking. She just completed a course of antibiotic therapy. She still has some wheezes on exam. I recommend steroid therapy for 1 week and nasal spray to reduce postnasal drip

## 2016-06-19 NOTE — Assessment & Plan Note (Signed)
I spent some time counseling the patient the importance of tobacco cessation. She is currently not interested to quit now. 

## 2016-06-19 NOTE — Progress Notes (Signed)
Patient ambulates well at discharge.  No signs or symptoms of dizziness or nausea noted.  No signs of distress.  Post vital signs stable.  Patient discharged home with no complaints.

## 2016-06-25 ENCOUNTER — Ambulatory Visit (INDEPENDENT_AMBULATORY_CARE_PROVIDER_SITE_OTHER): Payer: Medicare HMO | Admitting: Family Medicine

## 2016-06-25 ENCOUNTER — Encounter: Payer: Self-pay | Admitting: Family Medicine

## 2016-06-25 DIAGNOSIS — J209 Acute bronchitis, unspecified: Secondary | ICD-10-CM | POA: Insufficient documentation

## 2016-06-25 MED ORDER — IPRATROPIUM BROMIDE HFA 17 MCG/ACT IN AERS: 2.0000 | INHALATION_SPRAY | RESPIRATORY_TRACT | 12 refills | Status: DC | PRN

## 2016-06-25 MED FILL — ATROVENT HFA INHALER: 17 | 30 days supply | Qty: 13 | Fill #0

## 2016-06-25 NOTE — Patient Instructions (Signed)
Try this new inhaler, Atrovent (generic name ipratropium) 2 puffs every 4 hours if needed for cough.  I will talk with Dr Andy Gauss about having Lauren [name] assigned to me as his primary doctor.  I would still like Lauren [name] to see Dr Andy Gauss initially to get basic health information.

## 2016-06-29 ENCOUNTER — Ambulatory Visit (HOSPITAL_COMMUNITY)
Admission: RE | Admit: 2016-06-29 | Discharge: 2016-06-29 | Disposition: A | Discharge status: Home / Self Care | Payer: Medicare HMO | Source: Ambulatory Visit | Attending: Hematology and Oncology | Admitting: Hematology and Oncology

## 2016-06-29 ENCOUNTER — Encounter (HOSPITAL_COMMUNITY): Payer: Self-pay

## 2016-06-29 DIAGNOSIS — C3490 Malignant neoplasm of unspecified part of unspecified bronchus or lung: Secondary | ICD-10-CM | POA: Diagnosis not present

## 2016-06-29 DIAGNOSIS — C3412 Malignant neoplasm of upper lobe, left bronchus or lung: Secondary | ICD-10-CM | POA: Diagnosis not present

## 2016-06-29 MED ORDER — IOPAMIDOL (ISOVUE-300) INJECTION 61%
75.0000 mL | Freq: Once | INTRAVENOUS | Status: AC | PRN
  Administered 2016-06-29: 75 mL via INTRAVENOUS

## 2016-06-29 MED ORDER — IOPAMIDOL (ISOVUE-300) INJECTION 61%
INTRAVENOUS | Status: AC
  Filled 2016-06-29: qty 75

## 2016-06-29 MED ORDER — SODIUM CHLORIDE 0.9 % IJ SOLN
INTRAMUSCULAR | Status: AC
  Filled 2016-06-29: qty 50

## 2016-06-29 MED ORDER — IOPAMIDOL (ISOVUE-300) INJECTION 61%: 100.0000 mL | Freq: Once | INTRAVENOUS | Status: DC | PRN

## 2016-06-29 NOTE — Assessment & Plan Note (Signed)
New problem Start Mucinex Start trial of Atrovent to help decrease pulmonary secretions

## 2016-06-29 NOTE — Progress Notes (Signed)
Patient ID: TEALE GOODGAME, female   DOB: 1950/08/06, 66 y.o.   MRN: [id number] Visit for 30 minutes with over 50% of the visit spent in counseling. We spoke mostly about her difficulty in tolerating her current chemotherapeutic regiment, her family's expectation of her to continue the therapy. She worries about the care of her son, Merrilyn Puma, who has Huntington's Chorea.  She would like to have him followed at our practice.  Ms Meda feels comfortable with her current situation and feels she has adjusted to how her illness has progressed and with her future.    Onset: in last couple weeks  Course stable   Severity: mild to moderate Worse with: laying down.   Symptoms Sputum:yes  Fever: no  Shortness of breath:no  Leg Swelling:no  Heart Burn or Reflux:no  Wheezing:no  Post Nasal Drip: no  Rhinorrhea: yes   Continues to smoke Red Flags Weight Loss:  yes Immunocompromised:  yes  PMH Asthma or COPD: yes  PMH of Smoking: yes  Using ACEIs: no  ROS: No fever No hemoptysis VS reviewed GEN: Alert, Cooperative, Groomed, NAD HEENT: PERRL; EAC bilaterally not occluded, TM's translucent with normal LM, (+) LR;                No cervical LAN, No thyromegaly, No palpable masses COR: RRR, No M/G/R, No JVD, Normal PMI size and location LUNGS: BCTA, No Acc mm use, speaking in full sentences EXT: No peripheral leg edema. Gait: Normal speed, No significant path deviation, Step through +,  Psych: Normal affect/thought/speech/language

## 2016-06-29 NOTE — Progress Notes (Signed)
   Subjective:    Patient ID: Lauren [name], female    DOB: 1950/08/25, 66 y.o.   MRN: [id number]  HPI    Review of Systems     Objective:   Physical Exam        Assessment & Plan:

## 2016-06-30 ENCOUNTER — Telehealth: Payer: Self-pay | Admitting: Hematology and Oncology

## 2016-06-30 ENCOUNTER — Ambulatory Visit (HOSPITAL_BASED_OUTPATIENT_CLINIC_OR_DEPARTMENT_OTHER): Payer: Medicare HMO | Admitting: Hematology and Oncology

## 2016-06-30 DIAGNOSIS — T451X5A Adverse effect of antineoplastic and immunosuppressive drugs, initial encounter: Secondary | ICD-10-CM

## 2016-06-30 DIAGNOSIS — D61818 Other pancytopenia: Secondary | ICD-10-CM

## 2016-06-30 DIAGNOSIS — C7931 Secondary malignant neoplasm of brain: Secondary | ICD-10-CM

## 2016-06-30 DIAGNOSIS — G620 Drug-induced polyneuropathy: Secondary | ICD-10-CM | POA: Diagnosis not present

## 2016-06-30 DIAGNOSIS — C3412 Malignant neoplasm of upper lobe, left bronchus or lung: Secondary | ICD-10-CM | POA: Diagnosis not present

## 2016-06-30 DIAGNOSIS — Z7189 Other specified counseling: Secondary | ICD-10-CM

## 2016-06-30 DIAGNOSIS — Z720 Tobacco use: Secondary | ICD-10-CM

## 2016-06-30 DIAGNOSIS — J449 Chronic obstructive pulmonary disease, unspecified: Secondary | ICD-10-CM

## 2016-06-30 NOTE — Telephone Encounter (Signed)
Message sent to chemo scheduler to be added per 06/30/16 los. Appointments scheduled per 06/30/16 los. Left message on patient's voice mail regarding appointment info. Patient will also review on MyChart.

## 2016-07-01 ENCOUNTER — Telehealth: Payer: Self-pay | Admitting: Hematology and Oncology

## 2016-07-01 NOTE — Telephone Encounter (Signed)
Pt called to get chem appts added to 2/23 and 3/2 appts. Gave pt infusion appt times per tx sch

## 2016-07-02 ENCOUNTER — Telehealth: Payer: Self-pay

## 2016-07-02 ENCOUNTER — Encounter: Payer: Self-pay | Admitting: Hematology and Oncology

## 2016-07-02 NOTE — Assessment & Plan Note (Signed)
She feels better now This is due to chemo I have requested addition of Neulasta in addition to dose reduction

## 2016-07-02 NOTE — Assessment & Plan Note (Signed)
She has recently completed a course of antibiotics for COPD exacerbation I advised her to stop smoking

## 2016-07-02 NOTE — Assessment & Plan Note (Signed)
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 She is prescribed gabapentin. We will monitor carefully Reduce dose chemo as above 

## 2016-07-02 NOTE — Telephone Encounter (Signed)
Patient left message earlier about a question about a shot she is scheduled to get on the 2-23. Left message.

## 2016-07-02 NOTE — Progress Notes (Signed)
[facility] OFFICE PROGRESS NOTE  Patient Care Team: [name], MD as PCP - General (Family Medicine) Gaye Pollack, MD (Cardiothoracic Surgery) Dickie La, MD (Family Medicine) Heath Lark, MD as Consulting Physician (Hematology and Oncology) Verlin Grills, RN as [facility] Leighton Ruff, [place] (Optometry) Milus Banister, MD as Consulting Physician (Gastroenterology) Melida Quitter, MD as Consulting Physician (Otolaryngology)  SUMMARY OF ONCOLOGIC HISTORY: Oncology History   Colon cancer   Primary site: Colon and Rectum (Left)   Staging method: AJCC 7th Edition   Clinical: Stage I (T2, N0, M0) signed by Heath Lark, MD on 05/31/2013  2:39 PM   Pathologic: Stage I (T2, N0, cM0) signed by Heath Lark, MD on 05/31/2013  2:39 PM   Summary: Stage I (T2, N0, cM0) Lung cancer, EGFR/ALK negative, recurrence after initial resection to LN and brain   Primary site: Lung (Left)   Staging method: AJCC 7th Edition   Clinical: Stage IV (T1, N2, M1b) signed by Heath Lark, MD on 05/31/2013  2:26 PM   Pathologic: Stage IV (T1, N2, M1b) signed by Heath Lark, MD on 05/31/2013  2:26 PM   Summary: Stage IV (T1, N2, M1b)       Cancer of upper lobe of left lung, Adenocarcinoma   03/01/2007 Procedure    Colonoscopy revealed abnormalities and biopsy show high-grade dysplasia      04/01/2007 Surgery    She underwent sigmoid resection which showed T2 N0 colon cancer, and negative margins and all of 17 lymph nodes were negative      02/29/2008 Procedure    Repeat surveillance colonoscopy was negative.      06/16/2010 Surgery    She underwent left upper lobectomy we show well-differentiated adenocarcinoma of the lung, T1, N0, M0      03/18/2011 Procedure    Repeat colonoscopy show multiple polyps but there were benign      10/19/2011 Procedure    Biopsy of mediastinal lymph node came back positive for recurrence of lung cancer, EGFR and ALK  negative      11/16/2011 - 12/14/2011 Chemotherapy    She received concurrent chemoradiation therapy with weekly carboplatin and Taxol.      11/16/2011 - 12/29/2011 Radiation Therapy    She received radiation therapy with weekly chemotherapy      02/15/2012 Imaging    MR of the brain showed a new intracranial metastases. This was subsequently treated with stereotactic radiosurgery.      03/07/2012 - 04/12/2013 Chemotherapy    She received chemotherapy with maintainence Alimta every 3 weeks. Chemotherapy was discontinued due to profound fatigue      03/02/2013 Procedure    She had therapeutic ultrasound guidance thoracentesis for pleural effusion that came back negative for cancer      05/04/2013 Procedure    She had repeat ultrasound-guided thoracentesis again and cytology was negative      05/29/2013 Imaging    Repeat CT scan of the chest, abdomen and pelvis show no evidence of disease but persistent right-sided pleural effusion      06/02/2013 Surgery    The patient had placement of Pleurx catheter and subsequently underwent pleurodesis.      09/22/2013 Imaging    Repeat CT scan show no evidence of active disease. There are nonspecific lymphadenopathy and she is placed on observation.      03/23/2014 Imaging    Repeat CT scan of the chest, abdomen and pelvis show recurrence of cancer  with widespread bilateral pulmonary metastasis.      05/14/2014 Imaging    Imaging of the chest and brain were repeated due to delay of initiation of chemotherapy. Overall, chest CT scan show stable disease. MRI of the head was negative for recurrence      05/16/2014 - 07/18/2014 Chemotherapy     she completed 4 cycles of combination chemotherapy with carboplatin and Alimta      07/16/2014 Imaging     repeat CT scan of the chest, abdomen and pelvis show regression in the size of lung nodules.      08/29/2014 - 08/14/2015 Chemotherapy    She received maintenance Alimta      10/16/2014 Imaging      repeat CT scan of the chest, abdomen and pelvis show regression in the size of lung nodules.      01/29/2015 Imaging    Repeat CT scan showed stable disease      02/01/2015 Imaging    MRI brain is negative      04/30/2015 Imaging    CT scan of the chest, abdomen and pelvis showed stable disease      07/25/2015 Imaging    MRI brain showed no evidence of new disease      09/09/2015 Imaging    CT chest showed interval increase in size of large RIGHT upper lobe nodule is most consistent with lung cancer recurrence.      09/18/2015 - 03/26/2016 Chemotherapy    She started on Nivolumab      10/22/2015 Imaging    Screening mammogram showed mild abnormality      10/31/2015 Imaging    Diagnostic mammogram showed mild calcification at the right upper outer breast      11/27/2015 Imaging    Stable post treatment related changes of left lower lobectomy and radiation therapy redemonstrated, as above. No definite findings to suggest local recurrence of disease or metastatic disease on today's examination      01/24/2016 Imaging    MR brain showed continued stable appearance of two small treated brain metastases. No new or progressive metastatic disease to the brain.      04/15/2016 Imaging    Ct scan showed findings highly suspicious for recurrent tumor in the mediastinum surrounding the right mainstem bronchus and in the region of the azygoesophageal recess. Diffuse pulmonary metastatic disease with new and progressive nodules since the prior examination.      04/24/2016 -  Chemotherapy    The patient had chemotherapy with gemzar and Carboplatin      04/30/2016 Imaging    Decreased contrast enhancement of the two treated metastases in the right frontal and left occipital lobes. No new lesions.      06/01/2016 Imaging    CXR showed numerous metastatic nodules have progressed compared to the most recent comparison chest x-ray of 07/15/2015. Majority of these were noted on the 04/15/2016  chest CT. No segmental infiltrate or pulmonary edema.      06/19/2016 Adverse Reaction    Treatment is placed on hold today due to neutropenia and the patient not feeling well      06/29/2016 Imaging    Interval decrease in size of metastatic pulmonary nodules. 2. Persistent but slightly improved right hilar/mediastinal disease. No progressive findings. 3. No findings for upper abdominal metastatic disease       Brain metastases treated with radiosurgery   02/26/2012 Initial Diagnosis    Brain metastases treated with radiosurgery       INTERVAL HISTORY:  Please see below for problem oriented charting. She is here today with friend Izell Tuckahoe and her daughter She is still coughing from recent COPD exacerbation, felt better since finishing antibiotics Still has peripheral neuropathy The patient denies any recent signs or symptoms of bleeding such as spontaneous epistaxis, hematuria or hematochezia.   REVIEW OF SYSTEMS:   Constitutional: Denies fevers, chills or abnormal weight loss Eyes: Denies blurriness of vision Ears, nose, mouth, throat, and face: Denies mucositis or sore throat Cardiovascular: Denies palpitation, chest discomfort or lower extremity swelling Gastrointestinal:  Denies nausea, heartburn or change in bowel habits Skin: Denies abnormal skin rashes Lymphatics: Denies new lymphadenopathy or easy bruising Neurological:Denies numbness, tingling or new weaknesses Behavioral/Psych: Mood is stable, no new changes  All other systems were reviewed with the patient and are negative.  I have reviewed the past medical history, past surgical history, social history and family history with the patient and they are unchanged from previous note.  ALLERGIES:  is allergic to adhesive [tape] and lisinopril.  MEDICATIONS:  Current Outpatient Prescriptions  Medication Sig Dispense Refill  . COLACE 100 MG capsule Take 100 mg by mouth daily as needed.     Marland Kitchen HYDROmorphone (DILAUDID) 2  MG tablet Take 1 tablet (2 mg total) by mouth every 6 (six) hours as needed for severe pain. 30 tablet 0  . ipratropium (ATROVENT HFA) 17 MCG/ACT inhaler Inhale 2 puffs into the lungs every 4 (four) hours as needed for wheezing (Cough). 1 Inhaler 12  . lidocaine-prilocaine (EMLA) cream Apply topically as needed. Apply to porta cath site one hour prior to needle stick. 30 g 6  . nitroGLYCERIN (NITROSTAT) 0.4 MG SL tablet Place 1 tablet (0.4 mg total) under the tongue every 5 (five) minutes as needed. For chest pain 25 tablet 5  . ondansetron (ZOFRAN ODT) 4 MG disintegrating tablet Take 1 tablet (4 mg total) by mouth every 8 (eight) hours as needed for nausea or vomiting. 20 tablet 0  . predniSONE (DELTASONE) 50 MG tablet Take 1 tablet (50 mg total) by mouth daily with breakfast. 7 tablet 0  . umeclidinium-vilanterol (ANORO ELLIPTA) 62.5-25 MCG/INH AEPB Inhale 1 puff into the lungs daily. 3 each 3   No current facility-administered medications for this visit.    Facility-Administered Medications Ordered in Other Visits  Medication Dose Route Frequency Provider Last Rate Last Dose  . sodium chloride 0.9 % injection 10 mL  10 mL Intravenous PRN Heath Lark, MD   10 mL at 09/18/15 1345  . sodium chloride 0.9 % injection 10 mL  10 mL Intravenous PRN Heath Lark, MD   10 mL at 04/24/16 1136    PHYSICAL EXAMINATION: ECOG PERFORMANCE STATUS: 1 - Symptomatic but completely ambulatory  Vitals:   06/30/16 1113  BP: (!) 143/70  Pulse: 79  Resp: 18  Temp: 97.9 F (36.6 C)   Filed Weights   06/30/16 1113  Weight: 160 lb 1.6 oz (72.6 kg)    GENERAL:alert, no distress and comfortable SKIN: skin color, texture, turgor are normal, no rashes or significant lesions EYES: normal, Conjunctiva are pink and non-injected, sclera clear OROPHARYNX:no exudate, no erythema and lips, buccal mucosa, and tongue normal  NECK: supple, thyroid normal size, non-tender, without nodularity LYMPH:  no palpable  lymphadenopathy in the cervical, axillary or inguinal LUNGS: clear to auscultation and percussion with normal breathing effort HEART: regular rate & rhythm and no murmurs and no lower extremity edema ABDOMEN:abdomen soft, non-tender and normal bowel sounds Musculoskeletal:no cyanosis of digits  and no clubbing  NEURO: alert & oriented x 3 with fluent speech, no focal motor/sensory deficits  LABORATORY DATA:  I have reviewed the data as listed    Component Value Date/Time   NA 140 06/19/2016 0920   K 3.6 06/19/2016 0920   CL 106 12/26/2013 0946   CL 102 10/24/2012 1001   CO2 26 06/19/2016 0920   GLUCOSE 104 06/19/2016 0920   GLUCOSE 111 (H) 10/24/2012 1001   BUN 9.1 06/19/2016 0920   CREATININE 1.1 06/19/2016 0920   CALCIUM 8.9 06/19/2016 0920   PROT 6.8 06/19/2016 0920   ALBUMIN 3.0 (L) 06/19/2016 0920   AST 16 06/19/2016 0920   ALT 19 06/19/2016 0920   ALKPHOS 116 06/19/2016 0920   BILITOT 0.45 06/19/2016 0920   GFRNONAA 74 (L) 06/12/2013 0625   GFRAA 86 (L) 06/12/2013 0625    No results found for: SPEP, UPEP  Lab Results  Component Value Date   WBC 1.5 (L) 06/19/2016   NEUTROABS 0.7 (L) 06/19/2016   HGB 9.5 (L) 06/19/2016   HCT 28.9 (L) 06/19/2016   MCV 73.4 (L) 06/19/2016   PLT 267 06/19/2016      Chemistry      Component Value Date/Time   NA 140 06/19/2016 0920   K 3.6 06/19/2016 0920   CL 106 12/26/2013 0946   CL 102 10/24/2012 1001   CO2 26 06/19/2016 0920   BUN 9.1 06/19/2016 0920   CREATININE 1.1 06/19/2016 0920      Component Value Date/Time   CALCIUM 8.9 06/19/2016 0920   ALKPHOS 116 06/19/2016 0920   AST 16 06/19/2016 0920   ALT 19 06/19/2016 0920   BILITOT 0.45 06/19/2016 0920       RADIOGRAPHIC STUDIES: I have personally reviewed the radiological images as listed and agreed with the findings in the report. Ct Chest W Contrast  Result Date: 06/30/2016 CLINICAL DATA:  Restaging lung cancer. EXAM: CT CHEST WITH CONTRAST TECHNIQUE:  Multidetector CT imaging of the chest was performed during intravenous contrast administration. CONTRAST:  23mL ISOVUE-300 IOPAMIDOL (ISOVUE-300) INJECTION 61% COMPARISON:  Chest CT 04/15/2016 FINDINGS: Chest wall: Nodal breast masses or axillary lymphadenopathy. Small scattered lymph nodes are stable. Small left-sided supraclavicular nodes are stable. The largest node measures 7.5 mm on image 7. Both no new lymph nodes. 14 mm low-attenuation lesion in the left thyroid lobe previously measured 18 mm. The right-sided Port-A-Cath is stable. Cardiovascular: The heart is normal in size. No pericardial effusion. Stable advanced three-vessel coronary artery calcifications. Stable tortuosity, ectasia and calcification of the thoracic aorta. No aneurysm or dissection. Mediastinum/Nodes: Stable low-attenuation soft tissue surrounding the right hilar structures. The rounded density in the as ago esophageal recess area measures 18 x 16 mm and previously measured 20 x 17 mm. No new adenopathy. Epicardial lymph node on image number 96 measures 7 mm and previously measured 9.5 mm. Lungs/Pleura: Interval progression of diffuse pulmonary metastatic nodules. Right apical lesion on image number 21 measures 3 mm and previously measured 5 mm. 4 mm right upper lobe nodule on image 31 previously measured 7 mm. Right middle lobe lesion on image number 73 measures 5.5 mm and previously measured 6.5 mm. 4 mm left upper lobe nodule on image number 45 previously measured 6 mm. Several other pulmonary nodules have decreased in size. No new nodules. Upper Abdomen: Mild thickening of the left adrenal gland appears relatively stable. No evidence of hepatic metastatic disease. Stable atherosclerotic disease involving the upper abdominal aorta. Musculoskeletal: No acute  bony findings. No suspicious lytic or sclerotic bone lesions. IMPRESSION: 1. Interval decrease in size of metastatic pulmonary nodules. 2. Persistent but slightly improved right  hilar/mediastinal disease. No progressive findings. 3. No findings for upper abdominal metastatic disease. Electronically Signed   By: Marijo Sanes M.D.   On: 06/30/2016 07:51    ASSESSMENT & PLAN:  Cancer of upper lobe of left lung, Adenocarcinoma We reviewed CT imaging which showed positive response to treatment From her recent encounter, she felt that recent chemotherapy is making her sick. She has no quality of life. She also had infection in the setting of neutropenia. The patient was thinking about stopping chemotherapy. However, today, in the presence of her friend and daughter, she has made informed decision to resume treatment, with the understanding that if she feels sick again, she will stop I plan to reduce the dose of future carboplatin dose by at least 20% and 40% total reduction dose of gemzar She missed day 8 of cycle 3. 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 She is prescribed gabapentin. We will monitor carefully Reduce dose chemo as above  Goals of care, counseling/discussion The patient is aware she has stage IV disease and treatment is strictly palliative. We discussed importance of Advanced Directives and Living will. The patient has advanced directive. Her daughter is the dedicated medical power of attorney We discussed CODE STATUS; the patient desires to be DO NOT RESUSCITATE She shared with me that 2 of her sons have Huntington's disease and they are currently being cared for in group homes. Her desire is to continue chemotherapy to prolong life but if she becomes debilitated, she will stop treatment She does  not want long-term artificial feeding tube or hemodialysis if she developed multiple organ failure This is fully discussed in front of her daughter and friend, Izell Berne today   No orders of the defined types were placed in this encounter.  All questions were answered. The patient knows to call the clinic with any problems, questions or concerns. No barriers to learning was detected. I spent 30 minutes counseling the patient face to face. The total time spent in the appointment was 55 minutes and more than 50% was on counseling and review of test results     Heath Lark, MD 07/02/2016 3:56 AM

## 2016-07-02 NOTE — Assessment & Plan Note (Addendum)
We reviewed CT imaging which showed positive response to treatment From her recent encounter, she felt that recent chemotherapy is making her sick. She has no quality of life. She also had infection in the setting of neutropenia. The patient was thinking about stopping chemotherapy. However, today, in the presence of her friend and daughter, she has made informed decision to resume treatment, with the understanding that if she feels sick again, she will stop I plan to reduce the dose of future carboplatin dose by at least 20% and 40% total reduction dose of gemzar She missed day 8 of cycle 3. I will omit that dose

## 2016-07-02 NOTE — Assessment & Plan Note (Signed)
The patient is aware she has stage IV disease and treatment is strictly palliative. We discussed importance of Advanced Directives and Living will. The patient has advanced directive. Her daughter is the dedicated medical power of attorney We discussed CODE STATUS; the patient desires to be DO NOT RESUSCITATE She shared with me that 2 of her sons have Huntington's disease and they are currently being cared for in group homes. Her desire is to continue chemotherapy to prolong life but if she becomes debilitated, she will stop treatment She does not want long-term artificial feeding tube or hemodialysis if she developed multiple organ failure This is fully discussed in front of her daughter and friend, Izell Frazer today

## 2016-07-03 ENCOUNTER — Other Ambulatory Visit: Payer: Self-pay | Admitting: Hematology and Oncology

## 2016-07-03 ENCOUNTER — Telehealth: Payer: Self-pay

## 2016-07-03 ENCOUNTER — Other Ambulatory Visit: Payer: Self-pay | Admitting: *Deleted

## 2016-07-03 NOTE — Telephone Encounter (Signed)
Onpro should actually be applied for day 8, meaning on 3/2 I added the order to her Rx plan

## 2016-07-03 NOTE — Telephone Encounter (Signed)
Returned patient message, she is requesting that Nuelasta injection be changed to [place] Onpro to be applied the day of treatment on 2-23, so she does not have to come another day for the injection.

## 2016-07-03 NOTE — Patient Outreach (Signed)
[facility]) Care Management  07/03/2016   Lauren [name] 1951-05-05 [id number]  RN Health Coach telephone call to patient.  [employer] compliance verified. Per patient she is in the green zone. Patient is still smoking. Patient has a productive cough. Per patient with the inhaler it helps the mucous to come up better. Patient uses the inhaler mostly at night. Per patient more difficulty coughing when lying down .  She is going to continue chemotherapy after family discussion. Patient has  Two son swith huntington disease. One is living with her and one in a facility. Patient has a lot of stress now. Patient expresses feelings about situations. Patient has agreed to follow up outreach calls.      Current Medications:  Current Outpatient Prescriptions  Medication Sig Dispense Refill  . COLACE 100 MG capsule Take 100 mg by mouth daily as needed.     Marland Kitchen HYDROmorphone (DILAUDID) 2 MG tablet Take 1 tablet (2 mg total) by mouth every 6 (six) hours as needed for severe pain. 30 tablet 0  . ipratropium (ATROVENT HFA) 17 MCG/ACT inhaler Inhale 2 puffs into the lungs every 4 (four) hours as needed for wheezing (Cough). 1 Inhaler 12  . lidocaine-prilocaine (EMLA) cream Apply topically as needed. Apply to porta cath site one hour prior to needle stick. 30 g 6  . nitroGLYCERIN (NITROSTAT) 0.4 MG SL tablet Place 1 tablet (0.4 mg total) under the tongue every 5 (five) minutes as needed. For chest pain 25 tablet 5  . ondansetron (ZOFRAN ODT) 4 MG disintegrating tablet Take 1 tablet (4 mg total) by mouth every 8 (eight) hours as needed for nausea or vomiting. 20 tablet 0  . predniSONE (DELTASONE) 50 MG tablet Take 1 tablet (50 mg total) by mouth daily with breakfast. 7 tablet 0  . umeclidinium-vilanterol (ANORO ELLIPTA) 62.5-25 MCG/INH AEPB Inhale 1 puff into the lungs daily. 3 each 3   No current facility-administered medications for this visit.    Facility-Administered Medications Ordered  in Other Visits  Medication Dose Route Frequency Provider Last Rate Last Dose  . sodium chloride 0.9 % injection 10 mL  10 mL Intravenous PRN Heath Lark, MD   10 mL at 09/18/15 1345  . sodium chloride 0.9 % injection 10 mL  10 mL Intravenous PRN Heath Lark, MD   10 mL at 04/24/16 1136    Functional Status:  In your present state of health, do you have any difficulty performing the following activities: 07/03/2016 05/01/2016  Hearing? Y N  Vision? Y N  Difficulty concentrating or making decisions? [name]  Walking or climbing stairs? Y Y  Dressing or bathing? N Y  Doing errands, shopping? [name]  Preparing Food and eating ? N Y  Using the Toilet? N N  In the past six months, have you accidently leaked urine? Y N  Do you have problems with loss of bowel control? N N  Managing your Medications? N Y  Managing your Finances? N Y  Housekeeping or managing your Housekeeping? Y -  Some recent data might be hidden    Fall/Depression Screening: PHQ 2/9 Scores 07/03/2016 06/25/2016 06/02/2016 05/04/2016 05/01/2016 04/23/2016 04/07/2016  PHQ - 2 Score 0 1 0 0 0 0 0  PHQ- 9 Score - - - - - - -  Exception Documentation - - - - - - -   THN CM Care Plan Problem One   Flowsheet Row Most Recent Value  Care Plan Problem One  knowledge deficit  in self management of COPD  Role Documenting the Problem One  [name] for Problem One  Active  THN Long Term Goal (31-90 days)  Patient will not have any readmissions for COPD within the next 90 days  THN Long Term Goal Start Date  07/03/16  Interventions for Problem One Long Term Goal  RN reminded patient to keep appointments with PCP and oncology and pulmonary specialist. RN reminded patient to take medications as per order.  THN CM Short Term Goal #1 (0-30 days)  Patient will report better nutritional intake within the next 30 days  Interventions for Short Term Goal #1  RN discussed with patient about eating small meals and snacks. RN discussed with  patient about drinking more nutritional supplements  THN CM Short Term Goal #3 (0-30 days)  Patient will verbalize taking medications as per physician ordered  THN CM Short Term Goal #5 (0-30 days)  Patient will verbalize witin the next 30 days that she is getting in into a routine exercise program  [facility] CM Short Term Goal #5 Start Date  07/03/16  Interventions for Short Term Goal #5  RN sent patient a list of chair exercises with pictures. RN discussed with patient the importance of building up her strength. RN discusses with patient about taking short walks       Assessment:  Patient is receiving chemotherapy for lung cancer Patient will continue to  benefit from [employer] telephonic outreach for education and support for COPD self management.  Plan:  RN will continue to provide support as needed RN reiterated the COPD zones RN encourage po intake of shakes RN will follow up within the month of [name] [phone]

## 2016-07-10 ENCOUNTER — Other Ambulatory Visit (HOSPITAL_BASED_OUTPATIENT_CLINIC_OR_DEPARTMENT_OTHER): Payer: Medicare HMO

## 2016-07-10 ENCOUNTER — Ambulatory Visit (HOSPITAL_BASED_OUTPATIENT_CLINIC_OR_DEPARTMENT_OTHER): Payer: Medicare HMO

## 2016-07-10 VITALS — BP 152/71 | HR 76 | Temp 98.5°F | Resp 18

## 2016-07-10 DIAGNOSIS — C3412 Malignant neoplasm of upper lobe, left bronchus or lung: Secondary | ICD-10-CM

## 2016-07-10 DIAGNOSIS — C7931 Secondary malignant neoplasm of brain: Secondary | ICD-10-CM

## 2016-07-10 DIAGNOSIS — Z5111 Encounter for antineoplastic chemotherapy: Secondary | ICD-10-CM | POA: Diagnosis not present

## 2016-07-10 DIAGNOSIS — Z79899 Other long term (current) drug therapy: Secondary | ICD-10-CM | POA: Diagnosis not present

## 2016-07-10 DIAGNOSIS — Z5181 Encounter for therapeutic drug level monitoring: Secondary | ICD-10-CM

## 2016-07-10 DIAGNOSIS — C7949 Secondary malignant neoplasm of other parts of nervous system: Principal | ICD-10-CM

## 2016-07-10 LAB — CBC WITH DIFFERENTIAL/PLATELET
BASO%: 0.1 % (ref 0.0–2.0)
BASOS ABS: 0 10*3/uL (ref 0.0–0.1)
EOS%: 1 % (ref 0.0–7.0)
Eosinophils Absolute: 0.1 10*3/uL (ref 0.0–0.5)
HCT: 35.4 % (ref 34.8–46.6)
HGB: 10.9 g/dL — ABNORMAL LOW (ref 11.6–15.9)
LYMPH%: 13 % — ABNORMAL LOW (ref 14.0–49.7)
MCH: 24.5 pg — AB (ref 25.1–34.0)
MCHC: 30.8 g/dL — ABNORMAL LOW (ref 31.5–36.0)
MCV: 79.7 fL (ref 79.5–101.0)
MONO#: 0.7 10*3/uL (ref 0.1–0.9)
MONO%: 7.9 % (ref 0.0–14.0)
NEUT#: 6.6 10*3/uL — ABNORMAL HIGH (ref 1.5–6.5)
NEUT%: 78 % — AB (ref 38.4–76.8)
Platelets: 161 10*3/uL (ref 145–400)
RBC: 4.44 10*6/uL (ref 3.70–5.45)
RDW: 23.7 % — ABNORMAL HIGH (ref 11.2–14.5)
WBC: 8.4 10*3/uL (ref 3.9–10.3)
lymph#: 1.1 10*3/uL (ref 0.9–3.3)
nRBC: 0 % (ref 0–0)

## 2016-07-10 LAB — COMPREHENSIVE METABOLIC PANEL
ALBUMIN: 2.8 g/dL — AB (ref 3.5–5.0)
ALK PHOS: 110 U/L (ref 40–150)
ALT: 7 U/L (ref 0–55)
AST: 8 U/L (ref 5–34)
Anion Gap: 8 mEq/L (ref 3–11)
BUN: 20.5 mg/dL (ref 7.0–26.0)
CHLORIDE: 107 meq/L (ref 98–109)
CO2: 27 meq/L (ref 22–29)
Calcium: 8.9 mg/dL (ref 8.4–10.4)
Creatinine: 1.1 mg/dL (ref 0.6–1.1)
EGFR: 63 mL/min/{1.73_m2} — AB (ref >90)
GLUCOSE: 98 mg/dL (ref 70–140)
POTASSIUM: 4 meq/L (ref 3.5–5.1)
SODIUM: 142 meq/L (ref 136–145)
Total Bilirubin: 0.35 mg/dL (ref 0.20–1.20)
Total Protein: 6.2 g/dL — ABNORMAL LOW (ref 6.4–8.3)

## 2016-07-10 LAB — TSH: TSH: 1.131 m(IU)/L (ref 0.308–3.960)

## 2016-07-10 MED ORDER — CARBOPLATIN CHEMO INTRADERMAL TEST DOSE 100MCG/0.02ML
100.0000 ug | Freq: Once | INTRADERMAL | Status: AC
  Administered 2016-07-10: 100 ug via INTRADERMAL
  Filled 2016-07-10: qty 0.01

## 2016-07-10 MED ORDER — DEXAMETHASONE SODIUM PHOSPHATE 10 MG/ML IJ SOLN
INTRAMUSCULAR | Status: AC
  Filled 2016-07-10: qty 1

## 2016-07-10 MED ORDER — SODIUM CHLORIDE 0.9 % IV SOLN
600.0000 mg/m2 | Freq: Once | INTRAVENOUS | Status: AC
  Administered 2016-07-10: 1102 mg via INTRAVENOUS
  Filled 2016-07-10: qty 28.98

## 2016-07-10 MED ORDER — SODIUM CHLORIDE 0.9 % IV SOLN
Freq: Once | INTRAVENOUS | Status: AC
  Administered 2016-07-10: 11:00:00 via INTRAVENOUS

## 2016-07-10 MED ORDER — PALONOSETRON HCL INJECTION 0.25 MG/5ML
0.2500 mg | Freq: Once | INTRAVENOUS | Status: AC
  Administered 2016-07-10: 0.25 mg via INTRAVENOUS

## 2016-07-10 MED ORDER — SODIUM CHLORIDE 0.9% FLUSH
10.0000 mL | INTRAVENOUS | Status: DC | PRN
  Administered 2016-07-10: 10 mL
  Filled 2016-07-10: qty 10

## 2016-07-10 MED ORDER — PALONOSETRON HCL INJECTION 0.25 MG/5ML
INTRAVENOUS | Status: AC
  Filled 2016-07-10: qty 5

## 2016-07-10 MED ORDER — DEXAMETHASONE SODIUM PHOSPHATE 10 MG/ML IJ SOLN
10.0000 mg | Freq: Once | INTRAMUSCULAR | Status: AC
  Administered 2016-07-10: 10 mg via INTRAVENOUS

## 2016-07-10 MED ORDER — HEPARIN SOD (PORK) LOCK FLUSH 100 UNIT/ML IV SOLN
500.0000 [IU] | Freq: Once | INTRAVENOUS | Status: AC | PRN
  Administered 2016-07-10: 500 [IU]
  Filled 2016-07-10: qty 5

## 2016-07-10 MED ORDER — SODIUM CHLORIDE 0.9 % IV SOLN
134.5600 mg | Freq: Once | INTRAVENOUS | Status: AC
  Administered 2016-07-10: 130 mg via INTRAVENOUS
  Filled 2016-07-10: qty 13

## 2016-07-10 NOTE — Patient Instructions (Signed)
[facility] Discharge Instructions for Patients Receiving Chemotherapy  Today you received the following chemotherapy agents: Gemzar and Carboplatin   To help prevent nausea and vomiting after your treatment, we encourage you to take your nausea medication: As directed.    If you develop nausea and vomiting that is not controlled by your nausea medication, call the clinic.   BELOW ARE SYMPTOMS THAT SHOULD BE REPORTED IMMEDIATELY:  *FEVER GREATER THAN 100.5 F  *CHILLS WITH OR WITHOUT FEVER  NAUSEA AND VOMITING THAT IS NOT CONTROLLED WITH YOUR NAUSEA MEDICATION  *UNUSUAL SHORTNESS OF BREATH  *UNUSUAL BRUISING OR BLEEDING  TENDERNESS IN MOUTH AND THROAT WITH OR WITHOUT PRESENCE OF ULCERS  *URINARY PROBLEMS  *BOWEL PROBLEMS  UNUSUAL RASH Items with * indicate a potential emergency and should be followed up as soon as possible.  Feel free to call the clinic you have any questions or concerns. The clinic phone number is [phone].  Please show the [facility] at check-in to the Emergency Department and triage nurse.

## 2016-07-10 NOTE — Progress Notes (Signed)
Pt to receive onpro next week 07/17/16 for the first time. Pt viewed Onpro educational video in infusion room today.

## 2016-07-15 ENCOUNTER — Other Ambulatory Visit: Payer: Self-pay | Admitting: *Deleted

## 2016-07-15 NOTE — Patient Outreach (Signed)
[facility]) Care Management  07/15/2016  TOVE WIDEMAN 1950-11-22 [id number]   RN Health Coach received  telephone call from patient.  [employer] compliance verified. Patient called regarding of uncertainty of taking the new medications. Patient had talked with neighbor that had a lot of side effects from the medication. RN explained that everyone does not have the same effect. Patient discussed her fears that she is having about treatment and dying. RN health coach discussed with patient about talking to her physician about a referral to counseling. Patient needs to express feelings and concerns. Patient stated she had not been taking her medications as physician ordered. RN discussed with patient about medication adherence and what the medications are for. Plan: Patient will go and take medications now. Patient will talk with physician for a referral to therapist  Patient is scheduled for further outreach in March , but knows she can call for support anytime  [facility] [phone]

## 2016-07-16 ENCOUNTER — Encounter: Payer: Self-pay | Admitting: Hematology and Oncology

## 2016-07-16 NOTE — Progress Notes (Signed)
Rcvd message from [place] that pt was inquiring about copay assistance for Neulasta.  After researching, unfortunately, there aren't any foundations offering copay assistance w/ the type of ins she has for her Dx or that drug.  I left a vm for pt informing her of these findings.

## 2016-07-17 ENCOUNTER — Telehealth: Payer: Self-pay | Admitting: Hematology and Oncology

## 2016-07-17 ENCOUNTER — Encounter: Payer: Self-pay | Admitting: Hematology and Oncology

## 2016-07-17 ENCOUNTER — Ambulatory Visit (HOSPITAL_BASED_OUTPATIENT_CLINIC_OR_DEPARTMENT_OTHER): Payer: Medicare HMO | Admitting: Hematology and Oncology

## 2016-07-17 ENCOUNTER — Ambulatory Visit: Payer: Medicare HMO

## 2016-07-17 ENCOUNTER — Ambulatory Visit (HOSPITAL_BASED_OUTPATIENT_CLINIC_OR_DEPARTMENT_OTHER): Payer: Medicare HMO

## 2016-07-17 ENCOUNTER — Other Ambulatory Visit (HOSPITAL_BASED_OUTPATIENT_CLINIC_OR_DEPARTMENT_OTHER): Payer: Medicare HMO

## 2016-07-17 VITALS — BP 140/62 | HR 73 | Temp 97.7°F | Resp 18 | Ht 64.0 in | Wt 163.5 lb

## 2016-07-17 DIAGNOSIS — C3412 Malignant neoplasm of upper lobe, left bronchus or lung: Secondary | ICD-10-CM | POA: Diagnosis not present

## 2016-07-17 DIAGNOSIS — G620 Drug-induced polyneuropathy: Secondary | ICD-10-CM | POA: Diagnosis not present

## 2016-07-17 DIAGNOSIS — Z5111 Encounter for antineoplastic chemotherapy: Secondary | ICD-10-CM

## 2016-07-17 DIAGNOSIS — C7931 Secondary malignant neoplasm of brain: Secondary | ICD-10-CM | POA: Diagnosis not present

## 2016-07-17 DIAGNOSIS — C7949 Secondary malignant neoplasm of other parts of nervous system: Principal | ICD-10-CM

## 2016-07-17 DIAGNOSIS — D61818 Other pancytopenia: Secondary | ICD-10-CM

## 2016-07-17 DIAGNOSIS — Z79899 Other long term (current) drug therapy: Secondary | ICD-10-CM | POA: Diagnosis not present

## 2016-07-17 DIAGNOSIS — J449 Chronic obstructive pulmonary disease, unspecified: Secondary | ICD-10-CM | POA: Diagnosis not present

## 2016-07-17 DIAGNOSIS — Z5189 Encounter for other specified aftercare: Secondary | ICD-10-CM | POA: Diagnosis not present

## 2016-07-17 DIAGNOSIS — E440 Moderate protein-calorie malnutrition: Secondary | ICD-10-CM | POA: Diagnosis not present

## 2016-07-17 DIAGNOSIS — Z720 Tobacco use: Secondary | ICD-10-CM

## 2016-07-17 DIAGNOSIS — F17200 Nicotine dependence, unspecified, uncomplicated: Secondary | ICD-10-CM

## 2016-07-17 DIAGNOSIS — Z5181 Encounter for therapeutic drug level monitoring: Secondary | ICD-10-CM

## 2016-07-17 DIAGNOSIS — T451X5A Adverse effect of antineoplastic and immunosuppressive drugs, initial encounter: Secondary | ICD-10-CM

## 2016-07-17 LAB — COMPREHENSIVE METABOLIC PANEL
ALT: 14 U/L (ref 0–55)
AST: 11 U/L (ref 5–34)
Albumin: 3 g/dL — ABNORMAL LOW (ref 3.5–5.0)
Alkaline Phosphatase: 101 U/L (ref 40–150)
Anion Gap: 7 mEq/L (ref 3–11)
BUN: 18.6 mg/dL (ref 7.0–26.0)
CALCIUM: 9.1 mg/dL (ref 8.4–10.4)
CO2: 26 mEq/L (ref 22–29)
CREATININE: 1 mg/dL (ref 0.6–1.1)
Chloride: 110 mEq/L — ABNORMAL HIGH (ref 98–109)
EGFR: 71 mL/min/{1.73_m2} — ABNORMAL LOW (ref >90)
Glucose: 98 mg/dl (ref 70–140)
POTASSIUM: 3.9 meq/L (ref 3.5–5.1)
Sodium: 143 mEq/L (ref 136–145)
Total Bilirubin: 0.32 mg/dL (ref 0.20–1.20)
Total Protein: 6.4 g/dL (ref 6.4–8.3)

## 2016-07-17 LAB — CBC WITH DIFFERENTIAL/PLATELET
BASO%: 0.2 % (ref 0.0–2.0)
BASOS ABS: 0 10*3/uL (ref 0.0–0.1)
EOS%: 0.4 % (ref 0.0–7.0)
Eosinophils Absolute: 0 10*3/uL (ref 0.0–0.5)
HCT: 31.3 % — ABNORMAL LOW (ref 34.8–46.6)
HEMOGLOBIN: 9.7 g/dL — AB (ref 11.6–15.9)
LYMPH%: 8.6 % — ABNORMAL LOW (ref 14.0–49.7)
MCH: 25.1 pg (ref 25.1–34.0)
MCHC: 31 g/dL — ABNORMAL LOW (ref 31.5–36.0)
MCV: 80.9 fL (ref 79.5–101.0)
MONO#: 0.2 10*3/uL (ref 0.1–0.9)
MONO%: 4.2 % (ref 0.0–14.0)
NEUT#: 4.5 10*3/uL (ref 1.5–6.5)
NEUT%: 86.6 % — ABNORMAL HIGH (ref 38.4–76.8)
PLATELETS: 114 10*3/uL — AB (ref 145–400)
RBC: 3.87 10*6/uL (ref 3.70–5.45)
RDW: 22.8 % — AB (ref 11.2–14.5)
WBC: 5.2 10*3/uL (ref 3.9–10.3)
lymph#: 0.5 10*3/uL — ABNORMAL LOW (ref 0.9–3.3)

## 2016-07-17 LAB — TSH: TSH: 0.95 m[IU]/L (ref 0.308–3.960)

## 2016-07-17 MED ORDER — DEXAMETHASONE SODIUM PHOSPHATE 10 MG/ML IJ SOLN
INTRAMUSCULAR | Status: AC
  Filled 2016-07-17: qty 1

## 2016-07-17 MED ORDER — PEGFILGRASTIM 6 MG/0.6ML ~~LOC~~ PSKT
6.0000 mg | PREFILLED_SYRINGE | Freq: Once | SUBCUTANEOUS | Status: AC
  Administered 2016-07-17: 6 mg via SUBCUTANEOUS
  Filled 2016-07-17: qty 0.6

## 2016-07-17 MED ORDER — PALONOSETRON HCL INJECTION 0.25 MG/5ML
0.2500 mg | Freq: Once | INTRAVENOUS | Status: AC
  Administered 2016-07-17: 0.25 mg via INTRAVENOUS

## 2016-07-17 MED ORDER — CARBOPLATIN CHEMO INTRADERMAL TEST DOSE 100MCG/0.02ML
100.0000 ug | Freq: Once | INTRADERMAL | Status: AC
  Administered 2016-07-17: 100 ug via INTRADERMAL
  Filled 2016-07-17: qty 0.02

## 2016-07-17 MED ORDER — SODIUM CHLORIDE 0.9% FLUSH
10.0000 mL | INTRAVENOUS | Status: DC | PRN
  Administered 2016-07-17: 10 mL
  Filled 2016-07-17: qty 10

## 2016-07-17 MED ORDER — PREDNISONE 10 MG PO TABS: 10.0000 mg | ORAL_TABLET | Freq: Every day | ORAL | 1 refills | Status: DC

## 2016-07-17 MED ORDER — SODIUM CHLORIDE 0.9 % IV SOLN
134.5600 mg | Freq: Once | INTRAVENOUS | Status: AC
  Administered 2016-07-17: 130 mg via INTRAVENOUS
  Filled 2016-07-17: qty 13

## 2016-07-17 MED ORDER — SODIUM CHLORIDE 0.9 % IV SOLN
600.0000 mg/m2 | Freq: Once | INTRAVENOUS | Status: AC
  Administered 2016-07-17: 1102 mg via INTRAVENOUS
  Filled 2016-07-17: qty 28.98

## 2016-07-17 MED ORDER — HEPARIN SOD (PORK) LOCK FLUSH 100 UNIT/ML IV SOLN
500.0000 [IU] | Freq: Once | INTRAVENOUS | Status: AC | PRN
  Administered 2016-07-17: 500 [IU]
  Filled 2016-07-17: qty 5

## 2016-07-17 MED ORDER — PALONOSETRON HCL INJECTION 0.25 MG/5ML
INTRAVENOUS | Status: AC
  Filled 2016-07-17: qty 5

## 2016-07-17 MED ORDER — DEXAMETHASONE SODIUM PHOSPHATE 10 MG/ML IJ SOLN
10.0000 mg | Freq: Once | INTRAMUSCULAR | Status: AC
  Administered 2016-07-17: 10 mg via INTRAVENOUS

## 2016-07-17 MED ORDER — SODIUM CHLORIDE 0.9 % IV SOLN
Freq: Once | INTRAVENOUS | Status: AC
  Administered 2016-07-17: 13:00:00 via INTRAVENOUS

## 2016-07-17 MED ORDER — SODIUM CHLORIDE 0.9 % IJ SOLN
10.0000 mL | INTRAMUSCULAR | Status: DC | PRN
Start: 2016-07-17 — End: 2016-07-17
  Administered 2016-07-17: 10 mL via INTRAVENOUS
  Filled 2016-07-17: qty 10

## 2016-07-17 MED FILL — predniSONE 10 MG TABS: 10 | 30 days supply | Qty: 30 | Fill #0

## 2016-07-17 NOTE — Assessment & Plan Note (Signed)
She feels better now This is due to chemo I have requested addition of Neulasta in addition to dose reduction She is not symptomatic from anemia or thrombocytopenia.

## 2016-07-17 NOTE — Assessment & Plan Note (Signed)
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 She is prescribed gabapentin. We will monitor carefully Reduce dose chemo as above 

## 2016-07-17 NOTE — Assessment & Plan Note (Signed)
She has recently completed a course of antibiotics for COPD exacerbation I advised her to stop smoking Her symptoms have improved drastically with prednisone therapy. I refill her prescription of small low-dose prednisone

## 2016-07-17 NOTE — Patient Instructions (Signed)
[facility] Discharge Instructions for Patients Receiving Chemotherapy  Today you received the following chemotherapy agents: Gemzar and Carboplatin   To help prevent nausea and vomiting after your treatment, we encourage you to take your nausea medication: As directed.    If you develop nausea and vomiting that is not controlled by your nausea medication, call the clinic.   BELOW ARE SYMPTOMS THAT SHOULD BE REPORTED IMMEDIATELY:  *FEVER GREATER THAN 100.5 F  *CHILLS WITH OR WITHOUT FEVER  NAUSEA AND VOMITING THAT IS NOT CONTROLLED WITH YOUR NAUSEA MEDICATION  *UNUSUAL SHORTNESS OF BREATH  *UNUSUAL BRUISING OR BLEEDING  TENDERNESS IN MOUTH AND THROAT WITH OR WITHOUT PRESENCE OF ULCERS  *URINARY PROBLEMS  *BOWEL PROBLEMS  UNUSUAL RASH Items with * indicate a potential emergency and should be followed up as soon as possible.  Feel free to call the clinic you have any questions or concerns. The clinic phone number is [phone].  Please show the [facility] at check-in to the Emergency Department and triage nurse.  Pegfilgrastim injection What is this medicine? PEGFILGRASTIM (PEG fil gra stim) is a long-acting granulocyte colony-stimulating factor that stimulates the growth of neutrophils, a type of white blood cell important in the body's fight against infection. It is used to reduce the incidence of fever and infection in patients with certain types of cancer who are receiving chemotherapy that affects the bone marrow, and to increase survival after being exposed to high doses of radiation. This medicine may be used for other purposes; ask your health care provider or pharmacist if you have questions. COMMON BRAND NAME(S): Neulasta What should I tell my health care provider before I take this medicine? They need to know if you have any of these conditions: -kidney disease -latex allergy -ongoing radiation therapy -sickle cell disease -skin reactions  to acrylic adhesives (On-Body Injector only) -an unusual or allergic reaction to pegfilgrastim, filgrastim, other medicines, foods, dyes, or preservatives -pregnant or trying to get pregnant -breast-feeding How should I use this medicine? This medicine is for injection under the skin. If you get this medicine at home, you will be taught how to prepare and give the pre-filled syringe or how to use the On-body Injector. Refer to the patient Instructions for Use for detailed instructions. Use exactly as directed. Tell your healthcare provider immediately if you suspect that the On-body Injector may not have performed as intended or if you suspect the use of the On-body Injector resulted in a missed or partial dose. It is important that you put your used needles and syringes in a special sharps container. Do not put them in a trash can. If you do not have a sharps container, call your pharmacist or healthcare provider to get one. Talk to your pediatrician regarding the use of this medicine in children. While this drug may be prescribed for selected conditions, precautions do apply. Overdosage: If you think you have taken too much of this medicine contact a poison control center or emergency room at once. NOTE: This medicine is only for you. Do not share this medicine with others. What if I miss a dose? It is important not to miss your dose. Call your doctor or health care professional if you miss your dose. If you miss a dose due to an On-body Injector failure or leakage, a new dose should be administered as soon as possible using a single prefilled syringe for manual use. What may interact with this medicine? Interactions have not been studied.  Give your health care provider a list of all the medicines, herbs, non-prescription drugs, or dietary supplements you use. Also tell them if you smoke, drink alcohol, or use illegal drugs. Some items may interact with your medicine. This list may not describe all  possible interactions. Give your health care provider a list of all the medicines, herbs, non-prescription drugs, or dietary supplements you use. Also tell them if you smoke, drink alcohol, or use illegal drugs. Some items may interact with your medicine. What should I watch for while using this medicine? You may need blood work done while you are taking this medicine. If you are going to need a MRI, CT scan, or other procedure, tell your doctor that you are using this medicine (On-Body Injector only). What side effects may I notice from receiving this medicine? Side effects that you should report to your doctor or health care professional as soon as possible: -allergic reactions like skin rash, itching or hives, swelling of the face, lips, or tongue -dizziness -fever -pain, redness, or irritation at site where injected -pinpoint red spots on the skin -red or dark-brown urine -shortness of breath or breathing problems -stomach or side pain, or pain at the shoulder -swelling -tiredness -trouble passing urine or change in the amount of urine Side effects that usually do not require medical attention (report to your doctor or health care professional if they continue or are bothersome): -bone pain -muscle pain This list may not describe all possible side effects. Call your doctor for medical advice about side effects. You may report side effects to FDA at 1-800-FDA-1088. Where should I keep my medicine? Keep out of the reach of children. Store pre-filled syringes in a refrigerator between 2 and 8 degrees C (36 and 46 degrees F). Do not freeze. Keep in carton to protect from light. Throw away this medicine if it is left out of the refrigerator for more than 48 hours. Throw away any unused medicine after the expiration date. NOTE: This sheet is a summary. It may not cover all possible information. If you have questions about this medicine, talk to your doctor, pharmacist, or health care  provider.  2018 Elsevier/Gold Standard (2016-04-30 12:58:03)

## 2016-07-17 NOTE — Telephone Encounter (Signed)
Gave patient avs report and appointments for March and April.  °

## 2016-07-17 NOTE — Assessment & Plan Note (Signed)
She tolerated treatment well without major side effects. We will proceed with more treatment. I plan to see her back next month for further supportive care. Due to history recent neutropenia and infection, she will get G-CSF support after day 8 of treatment every cycle

## 2016-07-17 NOTE — Progress Notes (Signed)
[facility] OFFICE PROGRESS NOTE  Patient Care Team: [name], MD as PCP - General (Family Medicine) Gaye Pollack, MD (Cardiothoracic Surgery) Dickie La, MD (Family Medicine) Heath Lark, MD as Consulting Physician (Hematology and Oncology) Verlin Grills, RN as [facility] Leighton Ruff, [place] (Optometry) Milus Banister, MD as Consulting Physician (Gastroenterology) Melida Quitter, MD as Consulting Physician (Otolaryngology)  SUMMARY OF ONCOLOGIC HISTORY: Oncology History   Colon cancer   Primary site: Colon and Rectum (Left)   Staging method: AJCC 7th Edition   Clinical: Stage I (T2, N0, M0) signed by Heath Lark, MD on 05/31/2013  2:39 PM   Pathologic: Stage I (T2, N0, cM0) signed by Heath Lark, MD on 05/31/2013  2:39 PM   Summary: Stage I (T2, N0, cM0) Lung cancer, EGFR/ALK negative, recurrence after initial resection to LN and brain   Primary site: Lung (Left)   Staging method: AJCC 7th Edition   Clinical: Stage IV (T1, N2, M1b) signed by Heath Lark, MD on 05/31/2013  2:26 PM   Pathologic: Stage IV (T1, N2, M1b) signed by Heath Lark, MD on 05/31/2013  2:26 PM   Summary: Stage IV (T1, N2, M1b)       Cancer of upper lobe of left lung, Adenocarcinoma   03/01/2007 Procedure    Colonoscopy revealed abnormalities and biopsy show high-grade dysplasia      04/01/2007 Surgery    She underwent sigmoid resection which showed T2 N0 colon cancer, and negative margins and all of 17 lymph nodes were negative      02/29/2008 Procedure    Repeat surveillance colonoscopy was negative.      06/16/2010 Surgery    She underwent left upper lobectomy we show well-differentiated adenocarcinoma of the lung, T1, N0, M0      03/18/2011 Procedure    Repeat colonoscopy show multiple polyps but there were benign      10/19/2011 Procedure    Biopsy of mediastinal lymph node came back positive for recurrence of lung cancer, EGFR and ALK  negative      11/16/2011 - 12/14/2011 Chemotherapy    She received concurrent chemoradiation therapy with weekly carboplatin and Taxol.      11/16/2011 - 12/29/2011 Radiation Therapy    She received radiation therapy with weekly chemotherapy      02/15/2012 Imaging    MR of the brain showed a new intracranial metastases. This was subsequently treated with stereotactic radiosurgery.      03/07/2012 - 04/12/2013 Chemotherapy    She received chemotherapy with maintainence Alimta every 3 weeks. Chemotherapy was discontinued due to profound fatigue      03/02/2013 Procedure    She had therapeutic ultrasound guidance thoracentesis for pleural effusion that came back negative for cancer      05/04/2013 Procedure    She had repeat ultrasound-guided thoracentesis again and cytology was negative      05/29/2013 Imaging    Repeat CT scan of the chest, abdomen and pelvis show no evidence of disease but persistent right-sided pleural effusion      06/02/2013 Surgery    The patient had placement of Pleurx catheter and subsequently underwent pleurodesis.      09/22/2013 Imaging    Repeat CT scan show no evidence of active disease. There are nonspecific lymphadenopathy and she is placed on observation.      03/23/2014 Imaging    Repeat CT scan of the chest, abdomen and pelvis show recurrence of cancer  with widespread bilateral pulmonary metastasis.      05/14/2014 Imaging    Imaging of the chest and brain were repeated due to delay of initiation of chemotherapy. Overall, chest CT scan show stable disease. MRI of the head was negative for recurrence      05/16/2014 - 07/18/2014 Chemotherapy     she completed 4 cycles of combination chemotherapy with carboplatin and Alimta      07/16/2014 Imaging     repeat CT scan of the chest, abdomen and pelvis show regression in the size of lung nodules.      08/29/2014 - 08/14/2015 Chemotherapy    She received maintenance Alimta      10/16/2014 Imaging      repeat CT scan of the chest, abdomen and pelvis show regression in the size of lung nodules.      01/29/2015 Imaging    Repeat CT scan showed stable disease      02/01/2015 Imaging    MRI brain is negative      04/30/2015 Imaging    CT scan of the chest, abdomen and pelvis showed stable disease      07/25/2015 Imaging    MRI brain showed no evidence of new disease      09/09/2015 Imaging    CT chest showed interval increase in size of large RIGHT upper lobe nodule is most consistent with lung cancer recurrence.      09/18/2015 - 03/26/2016 Chemotherapy    She started on Nivolumab      10/22/2015 Imaging    Screening mammogram showed mild abnormality      10/31/2015 Imaging    Diagnostic mammogram showed mild calcification at the right upper outer breast      11/27/2015 Imaging    Stable post treatment related changes of left lower lobectomy and radiation therapy redemonstrated, as above. No definite findings to suggest local recurrence of disease or metastatic disease on today's examination      01/24/2016 Imaging    MR brain showed continued stable appearance of two small treated brain metastases. No new or progressive metastatic disease to the brain.      04/15/2016 Imaging    Ct scan showed findings highly suspicious for recurrent tumor in the mediastinum surrounding the right mainstem bronchus and in the region of the azygoesophageal recess. Diffuse pulmonary metastatic disease with new and progressive nodules since the prior examination.      04/24/2016 -  Chemotherapy    The patient had chemotherapy with gemzar and Carboplatin      04/30/2016 Imaging    Decreased contrast enhancement of the two treated metastases in the right frontal and left occipital lobes. No new lesions.      06/01/2016 Imaging    CXR showed numerous metastatic nodules have progressed compared to the most recent comparison chest x-ray of 07/15/2015. Majority of these were noted on the 04/15/2016  chest CT. No segmental infiltrate or pulmonary edema.      06/19/2016 Adverse Reaction    Treatment is placed on hold today due to neutropenia and the patient not feeling well      06/29/2016 Imaging    Interval decrease in size of metastatic pulmonary nodules. 2. Persistent but slightly improved right hilar/mediastinal disease. No progressive findings. 3. No findings for upper abdominal metastatic disease       Brain metastases treated with radiosurgery   02/26/2012 Initial Diagnosis    Brain metastases treated with radiosurgery       INTERVAL HISTORY:  Please see below for problem oriented charting. She returns today for day 8 treatment. She tolerated treatment well last week. With prednisone therapy, her energy level is excellent. She has gained weight due to improved appetite. She denies COPD exacerbation. Denies peripheral neuropathy. No recent nausea or vomiting.  REVIEW OF SYSTEMS:   Constitutional: Denies fevers, chills or abnormal weight loss Eyes: Denies blurriness of vision Ears, nose, mouth, throat, and face: Denies mucositis or sore throat Respiratory: Denies cough, dyspnea or wheezes Cardiovascular: Denies palpitation, chest discomfort or lower extremity swelling Gastrointestinal:  Denies nausea, heartburn or change in bowel habits Skin: Denies abnormal skin rashes Lymphatics: Denies new lymphadenopathy or easy bruising Neurological:Denies numbness, tingling or new weaknesses Behavioral/Psych: Mood is stable, no new changes  All other systems were reviewed with the patient and are negative.  I have reviewed the past medical history, past surgical history, social history and family history with the patient and they are unchanged from previous note.  ALLERGIES:  is allergic to adhesive [tape] and lisinopril.  MEDICATIONS:  Current Outpatient Prescriptions  Medication Sig Dispense Refill  . COLACE 100 MG capsule Take 100 mg by mouth daily as needed.     Marland Kitchen  HYDROmorphone (DILAUDID) 2 MG tablet Take 1 tablet (2 mg total) by mouth every 6 (six) hours as needed for severe pain. 30 tablet 0  . ipratropium (ATROVENT HFA) 17 MCG/ACT inhaler Inhale 2 puffs into the lungs every 4 (four) hours as needed for wheezing (Cough). 1 Inhaler 12  . lidocaine-prilocaine (EMLA) cream Apply topically as needed. Apply to porta cath site one hour prior to needle stick. 30 g 6  . nitroGLYCERIN (NITROSTAT) 0.4 MG SL tablet Place 1 tablet (0.4 mg total) under the tongue every 5 (five) minutes as needed. For chest pain 25 tablet 5  . ondansetron (ZOFRAN ODT) 4 MG disintegrating tablet Take 1 tablet (4 mg total) by mouth every 8 (eight) hours as needed for nausea or vomiting. 20 tablet 0  . predniSONE (DELTASONE) 10 MG tablet Take 1 tablet (10 mg total) by mouth daily with breakfast. 30 tablet 1  . umeclidinium-vilanterol (ANORO ELLIPTA) 62.5-25 MCG/INH AEPB Inhale 1 puff into the lungs daily. 3 each 3   No current facility-administered medications for this visit.    Facility-Administered Medications Ordered in Other Visits  Medication Dose Route Frequency Provider Last Rate Last Dose  . sodium chloride 0.9 % injection 10 mL  10 mL Intravenous PRN Heath Lark, MD   10 mL at 09/18/15 1345  . sodium chloride 0.9 % injection 10 mL  10 mL Intravenous PRN Heath Lark, MD   10 mL at 04/24/16 1136  . sodium chloride flush (NS) 0.9 % injection 10 mL  10 mL Intracatheter PRN Heath Lark, MD   10 mL at 07/17/16 1526    PHYSICAL EXAMINATION: ECOG PERFORMANCE STATUS: 0 - Asymptomatic  Vitals:   07/17/16 1145  BP: 140/62  Pulse: 73  Resp: 18  Temp: 97.7 F (36.5 C)   Filed Weights   07/17/16 1145  Weight: 163 lb 8 oz (74.2 kg)    GENERAL:alert, no distress and comfortable SKIN: skin color, texture, turgor are normal, no rashes or significant lesions EYES: normal, Conjunctiva are pink and non-injected, sclera clear OROPHARYNX:no exudate, no erythema and lips, buccal mucosa,  and tongue normal  NECK: supple, thyroid normal size, non-tender, without nodularity LYMPH:  no palpable lymphadenopathy in the cervical, axillary or inguinal LUNGS: clear to auscultation and percussion with normal breathing effort  HEART: regular rate & rhythm and no murmurs and no lower extremity edema ABDOMEN:abdomen soft, non-tender and normal bowel sounds Musculoskeletal:no cyanosis of digits and no clubbing  NEURO: alert & oriented x 3 with fluent speech, no focal motor/sensory deficits  LABORATORY DATA:  I have reviewed the data as listed    Component Value Date/Time   NA 143 07/17/2016 1108   K 3.9 07/17/2016 1108   CL 106 12/26/2013 0946   CL 102 10/24/2012 1001   CO2 26 07/17/2016 1108   GLUCOSE 98 07/17/2016 1108   GLUCOSE 111 (H) 10/24/2012 1001   BUN 18.6 07/17/2016 1108   CREATININE 1.0 07/17/2016 1108   CALCIUM 9.1 07/17/2016 1108   PROT 6.4 07/17/2016 1108   ALBUMIN 3.0 (L) 07/17/2016 1108   AST 11 07/17/2016 1108   ALT 14 07/17/2016 1108   ALKPHOS 101 07/17/2016 1108   BILITOT 0.32 07/17/2016 1108   GFRNONAA 74 (L) 06/12/2013 0625   GFRAA 86 (L) 06/12/2013 0625    No results found for: SPEP, UPEP  Lab Results  Component Value Date   WBC 5.2 07/17/2016   NEUTROABS 4.5 07/17/2016   HGB 9.7 (L) 07/17/2016   HCT 31.3 (L) 07/17/2016   MCV 80.9 07/17/2016   PLT 114 (L) 07/17/2016      Chemistry      Component Value Date/Time   NA 143 07/17/2016 1108   K 3.9 07/17/2016 1108   CL 106 12/26/2013 0946   CL 102 10/24/2012 1001   CO2 26 07/17/2016 1108   BUN 18.6 07/17/2016 1108   CREATININE 1.0 07/17/2016 1108      Component Value Date/Time   CALCIUM 9.1 07/17/2016 1108   ALKPHOS 101 07/17/2016 1108   AST 11 07/17/2016 1108   ALT 14 07/17/2016 1108   BILITOT 0.32 07/17/2016 1108       RADIOGRAPHIC STUDIES: I have personally reviewed the radiological images as listed and agreed with the findings in the report. Ct Chest W Contrast  Result Date:  06/30/2016 CLINICAL DATA:  Restaging lung cancer. EXAM: CT CHEST WITH CONTRAST TECHNIQUE: Multidetector CT imaging of the chest was performed during intravenous contrast administration. CONTRAST:  16m ISOVUE-300 IOPAMIDOL (ISOVUE-300) INJECTION 61% COMPARISON:  Chest CT 04/15/2016 FINDINGS: Chest wall: Nodal breast masses or axillary lymphadenopathy. Small scattered lymph nodes are stable. Small left-sided supraclavicular nodes are stable. The largest node measures 7.5 mm on image 7. Both no new lymph nodes. 14 mm low-attenuation lesion in the left thyroid lobe previously measured 18 mm. The right-sided Port-A-Cath is stable. Cardiovascular: The heart is normal in size. No pericardial effusion. Stable advanced three-vessel coronary artery calcifications. Stable tortuosity, ectasia and calcification of the thoracic aorta. No aneurysm or dissection. Mediastinum/Nodes: Stable low-attenuation soft tissue surrounding the right hilar structures. The rounded density in the as ago esophageal recess area measures 18 x 16 mm and previously measured 20 x 17 mm. No new adenopathy. Epicardial lymph node on image number 96 measures 7 mm and previously measured 9.5 mm. Lungs/Pleura: Interval progression of diffuse pulmonary metastatic nodules. Right apical lesion on image number 21 measures 3 mm and previously measured 5 mm. 4 mm right upper lobe nodule on image 31 previously measured 7 mm. Right middle lobe lesion on image number 73 measures 5.5 mm and previously measured 6.5 mm. 4 mm left upper lobe nodule on image number 45 previously measured 6 mm. Several other pulmonary nodules have decreased in size. No new nodules. Upper Abdomen: Mild thickening of the left  adrenal gland appears relatively stable. No evidence of hepatic metastatic disease. Stable atherosclerotic disease involving the upper abdominal aorta. Musculoskeletal: No acute bony findings. No suspicious lytic or sclerotic bone lesions. IMPRESSION: 1. Interval  decrease in size of metastatic pulmonary nodules. 2. Persistent but slightly improved right hilar/mediastinal disease. No progressive findings. 3. No findings for upper abdominal metastatic disease. Electronically Signed   By: Marijo Sanes M.D.   On: 06/30/2016 07:51    ASSESSMENT & PLAN:  Cancer of upper lobe of left lung, Adenocarcinoma She tolerated treatment well without major side effects. We will proceed with more treatment. I plan to see her back next month for further supportive care. Due to history recent neutropenia and infection, she will get G-CSF support after day 8 of treatment every cycle  Brain metastases treated with radiosurgery Her most recent MRI in December 2017 showed no evidence of disease recurrence. Plan to continue surveillance imaging study every 3-4 months  Pancytopenia, acquired ([place]) She feels better now This is due to chemo I have requested addition of Neulasta in addition to dose reduction She is not symptomatic from anemia or thrombocytopenia.  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 She is prescribed gabapentin. We will monitor carefully Reduce dose chemo as above  COPD mixed type ([place]) She has recently completed a course of antibiotics for COPD exacerbation I advised her to stop smoking Her symptoms have improved drastically with prednisone therapy. I refill her prescription of small low-dose prednisone  TOBACCO ABUSE I spent some time counseling the patient the importance of tobacco cessation. She is currently not interested to quit now.  Protein calorie malnutrition ([place]) Recent appetite has improved on prednisone therapy. Recommend low-dose prednisone therapy to help treat her COPD and as appetite stimulant.   No orders of the defined types were placed in this encounter.  All questions were answered. The patient knows to call the clinic with any problems, questions or  concerns. No barriers to learning was detected. I spent 25 minutes counseling the patient face to face. The total time spent in the appointment was 40 minutes and more than 50% was on counseling and review of test results     Heath Lark, MD 07/17/2016 4:08 PM

## 2016-07-17 NOTE — Assessment & Plan Note (Signed)
I spent some time counseling the patient the importance of tobacco cessation. She is currently not interested to quit now. 

## 2016-07-17 NOTE — Assessment & Plan Note (Signed)
Her most recent MRI in December 2017 showed no evidence of disease recurrence. Plan to continue surveillance imaging study every 3-4 months

## 2016-07-17 NOTE — Assessment & Plan Note (Signed)
Recent appetite has improved on prednisone therapy. Recommend low-dose prednisone therapy to help treat her COPD and as appetite stimulant.

## 2016-07-21 NOTE — Progress Notes (Signed)
[facility] Counseling Note  Counselor called pt per Abby Potash Hock/LCSW's referral. Counselor introduced self and [facility] services; pt requested an individual counseling appointment. Counselor and pt agreed to meet during pt's chemo treatment on 3/8 at 2p. Counselor will conduct intake session at this time.  Westly Pam, [place] [facility] [phone]

## 2016-07-25 ENCOUNTER — Emergency Department (HOSPITAL_COMMUNITY)
Admission: EM | Admit: 2016-07-25 | Discharge: 2016-07-25 | Disposition: A | Discharge status: Home / Self Care | Payer: Medicare HMO | Attending: Emergency Medicine | Admitting: Emergency Medicine

## 2016-07-25 ENCOUNTER — Encounter (HOSPITAL_COMMUNITY): Payer: Self-pay | Admitting: Emergency Medicine

## 2016-07-25 DIAGNOSIS — R52 Pain, unspecified: Secondary | ICD-10-CM | POA: Diagnosis not present

## 2016-07-25 DIAGNOSIS — M542 Cervicalgia: Secondary | ICD-10-CM | POA: Diagnosis not present

## 2016-07-25 DIAGNOSIS — N183 Chronic kidney disease, stage 3 (moderate): Secondary | ICD-10-CM | POA: Diagnosis not present

## 2016-07-25 DIAGNOSIS — D696 Thrombocytopenia, unspecified: Secondary | ICD-10-CM | POA: Diagnosis not present

## 2016-07-25 DIAGNOSIS — F17210 Nicotine dependence, cigarettes, uncomplicated: Secondary | ICD-10-CM | POA: Insufficient documentation

## 2016-07-25 DIAGNOSIS — Z85038 Personal history of other malignant neoplasm of large intestine: Secondary | ICD-10-CM | POA: Diagnosis not present

## 2016-07-25 DIAGNOSIS — Z85118 Personal history of other malignant neoplasm of bronchus and lung: Secondary | ICD-10-CM | POA: Insufficient documentation

## 2016-07-25 DIAGNOSIS — I129 Hypertensive chronic kidney disease with stage 1 through stage 4 chronic kidney disease, or unspecified chronic kidney disease: Secondary | ICD-10-CM | POA: Insufficient documentation

## 2016-07-25 DIAGNOSIS — Z7982 Long term (current) use of aspirin: Secondary | ICD-10-CM | POA: Diagnosis not present

## 2016-07-25 DIAGNOSIS — Z79899 Other long term (current) drug therapy: Secondary | ICD-10-CM | POA: Insufficient documentation

## 2016-07-25 DIAGNOSIS — J449 Chronic obstructive pulmonary disease, unspecified: Secondary | ICD-10-CM | POA: Diagnosis not present

## 2016-07-25 DIAGNOSIS — Z85841 Personal history of malignant neoplasm of brain: Secondary | ICD-10-CM | POA: Insufficient documentation

## 2016-07-25 DIAGNOSIS — I2510 Atherosclerotic heart disease of native coronary artery without angina pectoris: Secondary | ICD-10-CM | POA: Diagnosis not present

## 2016-07-25 LAB — COMPREHENSIVE METABOLIC PANEL
ALT: 14 U/L (ref 14–54)
AST: 14 U/L — ABNORMAL LOW (ref 15–41)
Albumin: 3.1 g/dL — ABNORMAL LOW (ref 3.5–5.0)
Alkaline Phosphatase: 107 U/L (ref 38–126)
Anion gap: 5 (ref 5–15)
BUN: 14 mg/dL (ref 6–20)
CO2: 26 mmol/L (ref 22–32)
Calcium: 8.4 mg/dL — ABNORMAL LOW (ref 8.9–10.3)
Chloride: 109 mmol/L (ref 101–111)
Creatinine, Ser: 0.99 mg/dL (ref 0.44–1.00)
GFR calc Af Amer: >60 mL/min (ref >60)
GFR calc non Af Amer: 58 mL/min — ABNORMAL LOW (ref >60)
Glucose, Bld: 95 mg/dL (ref 65–99)
Potassium: 3.3 mmol/L — ABNORMAL LOW (ref 3.5–5.1)
Sodium: 140 mmol/L (ref 135–145)
Total Bilirubin: 0.8 mg/dL (ref 0.3–1.2)
Total Protein: 6.3 g/dL — ABNORMAL LOW (ref 6.5–8.1)

## 2016-07-25 LAB — TYPE AND SCREEN
ABO/RH(D): A POS
ANTIBODY SCREEN: NEGATIVE

## 2016-07-25 LAB — CBC WITH DIFFERENTIAL/PLATELET
BASOS PCT: 1 %
Basophils Absolute: 0 10*3/uL (ref 0.0–0.1)
EOS PCT: 2 %
Eosinophils Absolute: 0.1 10*3/uL (ref 0.0–0.7)
HCT: 30.5 % — ABNORMAL LOW (ref 36.0–46.0)
HEMOGLOBIN: 9.7 g/dL — AB (ref 12.0–15.0)
Lymphocytes Relative: 37 %
Lymphs Abs: 0.9 10*3/uL (ref 0.7–4.0)
MCH: 25.7 pg — AB (ref 26.0–34.0)
MCHC: 31.8 g/dL (ref 30.0–36.0)
MCV: 80.7 fL (ref 78.0–100.0)
MONOS PCT: 10 %
Monocytes Absolute: 0.3 10*3/uL (ref 0.1–1.0)
NEUTROS PCT: 50 %
Neutro Abs: 1.2 10*3/uL — ABNORMAL LOW (ref 1.7–7.7)
PLATELETS: 15 10*3/uL — AB (ref 150–400)
RBC: 3.78 MIL/uL — AB (ref 3.87–5.11)
RDW: 23 % — ABNORMAL HIGH (ref 11.5–15.5)
WBC: 2.5 10*3/uL — AB (ref 4.0–10.5)

## 2016-07-25 LAB — ABO/RH: ABO/RH(D): A POS

## 2016-07-25 MED ORDER — HYDROMORPHONE HCL 1 MG/ML IJ SOLN
1.0000 mg | Freq: Once | INTRAMUSCULAR | Status: DC
  Filled 2016-07-25: qty 1

## 2016-07-25 MED ORDER — HEPARIN SOD (PORK) LOCK FLUSH 100 UNIT/ML IV SOLN
500.0000 [IU] | Freq: Once | INTRAVENOUS | Status: DC
  Filled 2016-07-25: qty 5

## 2016-07-25 MED ORDER — HYDROMORPHONE HCL 1 MG/ML IJ SOLN
1.0000 mg | Freq: Once | INTRAMUSCULAR | Status: AC
  Administered 2016-07-25: 1 mg via INTRAVENOUS

## 2016-07-25 MED ORDER — SODIUM CHLORIDE 0.9 % IV SOLN
10.0000 mL/h | Freq: Once | INTRAVENOUS | Status: AC
  Administered 2016-07-25: 10 mL/h via INTRAVENOUS

## 2016-07-25 MED ORDER — HYDROMORPHONE HCL 1 MG/ML IJ SOLN
1.0000 mg | Freq: Once | INTRAMUSCULAR | Status: AC
  Administered 2016-07-25: 1 mg via INTRAMUSCULAR
  Filled 2016-07-25: qty 1

## 2016-07-25 NOTE — ED Triage Notes (Signed)
Pt from home via EMS c/o uncontrolled sacral and neck pain. Pt is has brain, colon, lung and esophageal CA. Pt is currently taking chemo. Pt has nulesta patch in place. Pt has taken tramadol, voltaren and dilaudid with no relief. Pt rates pain 10/10. Pt is A&O and ambulatory.

## 2016-07-25 NOTE — ED Notes (Signed)
Bed: OR56 Expected date:  Expected time:  Means of arrival:  Comments: 66 yo CA pt, pain control

## 2016-07-25 NOTE — ED Notes (Signed)
Platelets 15 per Zenaida Niece, lab tech. M.D made aware.

## 2016-07-25 NOTE — ED Provider Notes (Signed)
[facility] DEPT Provider Note   CSN: [id number] Arrival date & time: 07/25/16  0751     History   Chief Complaint Chief Complaint  Patient presents with  . Tailbone Pain  . Neck Pain    HPI Lauren [name] is a 66 y.o. female.  HPI Patient presents with concern of pain in her neck, lower back. Pain began possibly with past day, maybe longer. Since onset patient has had soreness in both of these areas, minimal relief with OTC medication, some relief with Dilaudid. No new neck stiffness, no new extremity weakness, no new confusion, disorientation, fever, chills. 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 Last chemotherapy was one week ago. Patient also started Neulasta therapy last week. Patient is receiving gemcitabine therapy.  Past Medical History:  Diagnosis Date  . Abnormality of gait 09/10/2015  . Acute on chronic respiratory failure with hypoxemia ([employer]) 07/11/2015  . Anemia due to chemotherapy 08/29/2014  . 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 Angina pectoris ([place]) 03/05/2016  . Arthritis   . ASCUS with positive high risk HPV 04/21/2012   colpo clinically normal 04/2012.  Given her other significant co morbidities (recurrent lung cancer, brain mets and colon cancer requiring chronic chemo) I don't think I would be very aggressive in pursuing any more paps. If she felt necessary, could do a repeat in one year, but the likelihood is that this is the least of her problems and I doubt she will benefit from repeated paps and colpos and I sincerely doubt anything we find would change the course of her life. We discussed. She has already decided to consider stopping chemo in May because after 5 years she is quite tired of it. I would always be willing to do a pap, but my recommendation is to stop.   . Back pain   . Benign paroxysmal positional vertigo 08/16/2015  . Bilateral hearing loss, Mild  02/27/2016   03/23/16 Hearing loss Evaluation by Unice Bailey, MD, [facility] Ear, Nose and Throat, Dx with mild bilateral hearing loss except normal hearing in right ear of 500 Hz and 1000 Hz.  Dr Redmond Baseman recommends annual hearing evaluations.   . Bilateral leg edema 10/17/2014  . Bilateral pleural effusion 08/09/2013  . Blurred vision, bilateral 06/06/2014  . Brain metastases ([place]) 02/15/12  . Cerebral aneurysm   . CEREBRAL ANEURYSM 12/29/2006   Qualifier: Diagnosis of  By: Girard Cooter MD, MAKEECHA    . Chronic dermatitis of hands 02/27/2016  . Chronic folliculitis    of groin  . Colon cancer ([place]) 11/08  . COPD (chronic obstructive pulmonary disease) ([place])   . 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 Coronary artery disease   . Coronary atherosclerosis 09/09/2007   Qualifier: Diagnosis of  By: Martinique, Bonnie    . Depression   . Depressive disorder   . DEPRESSIVE DISORDER, MAJOR, RCR, MILD 12/29/2006   Qualifier: Diagnosis of  By: Girard Cooter MD, MAKEECHA    . Dyspnea 07/15/2015  . Encounter for antineoplastic chemotherapy 09/11/2015  . Encounter for therapeutic drug monitoring 09/11/2015  . Essential hypertension 12/29/2006   Echocardiogram (07/26/13): Left ventricle: The cavity size was normal. Wall thickness was increased in a pattern of mild LVH. Systolic function was normal. The estimated ejection fraction was in the range of 55% to 60%. Wall motion was normal; there were no regional wall motion abnormalities. Normal mitral valve.    . Falls  frequently 08/02/2015  . Family history of Huntington's disease   . Family history of trichomonal vaginitis 05/2005  . Fatigue 02/06/2013  . 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 Fibrocystic breast changes   . GERD 12/29/2006   Qualifier: Diagnosis of  By: Girard Cooter MD, MAKEECHA    . GERD (gastroesophageal reflux disease)   . History of colon cancer 05/31/2013  . Hypercholesterolemia   . HYPERCHOLESTEROLEMIA 12/29/2006   Qualifier: Diagnosis of  By:  Girard Cooter MD, MAKEECHA    . Hyperlipidemia   . Hypertension   . Hypokalemia 08/09/2013  . Insomnia 10/05/2011  . Left-sided low back pain with left-sided sciatica 07/18/2014  . Low back pain with sciatica 06/22/2014  . Lung cancer ([place]) 06/16/10   PET scan 04/28/2010; primary: increase in size 02/2010 / Well Differentiated Adenocarcinoma of the lung   . Lung nodule    FNA ordered for 04/02/10 by HA>pos Ca  . LVH (left ventricular hypertrophy) 12/20/2014   Echocardiogram (07/26/13): Left ventricle: Wall thickness was increased in a pattern of mild LVH. The cavity size was normal. Systolic function was normal. The estimated ejection fraction was in the range of 55% to 60%. Wall motion was normal; there were no regional wall motion abnormalities. Normal mitral valve.   . On antineoplastic chemotherapy started 02/2012   [place]  . Other fatigue 11/28/2014  . Pleural effusion 05/03/2013  . 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 Postmenopausal   . POSTMENOPAUSAL SYNDROME 01/31/2009   Qualifier: Diagnosis of  By: Carlena Sax  MD, [place]    . Postural dizziness 06/19/2015  . Protein-calorie malnutrition, moderate ([place]) 09/11/2015  . S/P radiation therapy 03/01/12   SRS: 1 fraction / 20 Gray each to the Left Occipital Region and to the Right Insular Metastases  . Sensorineural hearing loss (SNHL) of both ears 04/23/2016   Dr Keturah Barre. Redmond Baseman (ENT) dx significant bilateral hearing loss and recommended biaural hearing aids.   . Shortness of breath   . Skin rash 10/03/2015  . Status post chemotherapy comp. 12/29/11   Carboplatin/Taxol  . Status post radiation therapy 11/16/11 - 12/29/11   Right Lung and Mediastinum: 60 Gy  . Status post stereotactic radiosurgery 01/2012   for Brain Metastases  . Tobacco dependence   . Vitamin D deficiency 08/30/2015  . Weight loss 10/22/2011    Patient Active Problem List   Diagnosis Date Noted  . Acute bronchitis 06/25/2016  . Cancer associated pain  05/22/2016  . Pancytopenia, acquired ([place]) 05/01/2016  . Chronic kidney disease, stage III (moderate) 05/01/2016  . Sensorineural hearing loss (SNHL) of both ears 04/23/2016  . Osteoarthritis of right knee 04/23/2016  . Balance problem 04/23/2016  . Impingement syndrome of left shoulder 04/23/2016  . Goals of care, counseling/discussion 04/16/2016  . Chronic dermatitis of hands 02/27/2016  . Abnormal mammogram of right breast 11/01/2015  . Encounter for chemotherapy management 09/11/2015  . Encounter for therapeutic drug monitoring 09/11/2015  . Encounter for antineoplastic chemotherapy 09/11/2015  . Vitamin D deficiency 08/30/2015  . Mild cognitive impairment 08/16/2015  . Falls frequently 08/02/2015  . COPD mixed type ([place]) 07/15/2015  . Protein calorie malnutrition ([place]) 03/14/2015  . LVH (left ventricular hypertrophy) 12/20/2014  . Blepharitis of both eyes 11/09/2014  . Neuropathy due to chemotherapeutic drug ([place]) 07/19/2014  . History of colon cancer 05/31/2013  . Brain metastases treated with radiosurgery 02/26/2012  . Cancer of upper lobe of left lung, Adenocarcinoma   . TOBACCO ABUSE 10/04/2007  .  Coronary atherosclerosis 09/09/2007  . HYPERCHOLESTEROLEMIA 12/29/2006  . DEPRESSIVE DISORDER, MAJOR, RCR, MILD 12/29/2006  . Essential hypertension 12/29/2006  . CEREBRAL ANEURYSM 12/29/2006  . GERD 12/29/2006    Past Surgical History:  Procedure Laterality Date  . BACK SURGERY     Dr Luiz Ochoa  . BREAST SURGERY     Bil lumpectomy  . cardiac cath x3    . CHEST TUBE INSERTION Right 06/12/2013   Procedure: INSERTION PLEURAL DRAINAGE CATHETER;  Surgeon: Gaye Pollack, MD;  Location: [facility];  Service: Thoracic;  Laterality: Right;  . COLECTOMY  03/22/07   Stage 1 pT2 N0, M0 Adenocarcinoma of the sigmoid  colon  . HERNIA REPAIR    . LUNG LOBECTOMY  06/16/10   Left Upper Lobectomy  . MEDIASTINOSCOPY  10/19/2011   Procedure: MEDIASTINOSCOPY;  Surgeon: Gaye Pollack, MD;   Location: [facility];  Service: Thoracic;  Laterality: N/A;  . TALC PLEURODESIS Right 06/12/2013   Procedure: Pietro Cassis;  Surgeon: Gaye Pollack, MD;  Location: [facility];  Service: Thoracic;  Laterality: Right;  . TUBAL LIGATION    . TUNNELED VENOUS CATHETER PLACEMENT     Port-a-Cath    OB History    No data available       Home Medications    Prior to Admission medications   Medication Sig Start Date End Date Taking? Authorizing Provider  aspirin 81 MG EC tablet Take 81 mg by mouth daily.  07/06/16  Yes Historical Provider, MD  BYSTOLIC 10 MG tablet Take 10 mg by mouth daily.  06/16/16  Yes Historical Provider, MD  cholecalciferol (VITAMIN D) 1000 units tablet Take 1,000 Units by mouth daily.  07/06/16  Yes Historical Provider, MD  COLACE 100 MG capsule Take 100 mg by mouth daily as needed for mild constipation.  10/25/15  Yes Historical Provider, MD  HYDROmorphone (DILAUDID) 2 MG tablet Take 1 tablet (2 mg total) by mouth every 6 (six) hours as needed for severe pain. 05/22/16  Yes Heath Lark, MD  ipratropium (ATROVENT HFA) 17 MCG/ACT inhaler Inhale 2 puffs into the lungs every 4 (four) hours as needed for wheezing (Cough). 06/25/16  Yes [name], MD  lidocaine-prilocaine (EMLA) cream Apply topically as needed. Apply to porta cath site one hour prior to needle stick. 08/29/14  Yes Heath Lark, MD  nitroGLYCERIN (NITROSTAT) 0.4 MG SL tablet Place 1 tablet (0.4 mg total) under the tongue every 5 (five) minutes as needed. For chest pain 12/20/14  Yes [name], MD  omeprazole (PRILOSEC) 40 MG capsule Take 40 mg by mouth daily.  06/16/16  Yes Historical Provider, MD  ondansetron (ZOFRAN ODT) 4 MG disintegrating tablet Take 1 tablet (4 mg total) by mouth every 8 (eight) hours as needed for nausea or vomiting. 04/23/16  Yes Todd D McDiarmid, MD  pravastatin (PRAVACHOL) 40 MG tablet Take 40 mg by mouth daily.  06/16/16  Yes Historical Provider, MD  predniSONE (DELTASONE) 10 MG tablet Take 1  tablet (10 mg total) by mouth daily with breakfast. 07/17/16  Yes Heath Lark, MD  umeclidinium-vilanterol (ANORO ELLIPTA) 62.5-25 MCG/INH AEPB Inhale 1 puff into the lungs daily. 06/01/16  Yes Noralee Space, MD  VENTOLIN HFA 108 (90 Base) MCG/ACT inhaler Inhale 2 puffs into the lungs every 4 (four) hours as needed for wheezing or shortness of breath.  06/01/16  Yes Historical Provider, MD    Family History Family History  Problem Relation Age of Onset  . Stomach cancer Maternal  Aunt   . Osteoarthritis Father   . Gout Father   . Hypertension Father   . Heart disease Mother     pericarditis;   . Breast cancer Cousin   . Cancer Sister     Lymphatic  . Huntington's disease Son   . Huntington's disease Son   . Anesthesia problems Neg Hx     Social History Social History  Substance Use Topics  . Smoking status: Current Every Day Smoker    Packs/day: 0.75    Types: Cigarettes    Start date: 05/18/1965  . Smokeless tobacco: Never Used  . Alcohol use 0.0 oz/week     Comment: occasional     Allergies   Adhesive [tape] and Lisinopril   Review of Systems Review of Systems  Constitutional:       Per HPI, otherwise negative  HENT:       Per HPI, otherwise negative  Respiratory:       Per HPI, otherwise negative  Cardiovascular:       Per HPI, otherwise negative  Gastrointestinal: Negative for vomiting.  Endocrine:       Negative aside from HPI  Genitourinary:       Neg aside from HPI   Musculoskeletal:       Per HPI, otherwise negative  Skin: Negative.   Allergic/Immunologic: Positive for immunocompromised state.  Neurological: Negative for syncope.     Physical Exam Updated Vital Signs BP 136/76 (BP Location: Right Arm)   Pulse 77   Temp 98.2 F (36.8 C) (Oral)   Resp 16   Ht '5\' 4"'$  (1.626 m)   Wt 163 lb (73.9 kg)   SpO2 100%   BMI 27.98 kg/m   Physical Exam  Constitutional: She is oriented to person, place, and time. She appears well-developed and  well-nourished. No distress.  HENT:  Head: Normocephalic and atraumatic.  Eyes: Conjunctivae and EOM are normal.  Neck: Normal range of motion. Neck supple. No spinous process tenderness and no muscular tenderness present. No neck rigidity. No edema and no erythema present.  Cardiovascular: Normal rate and regular rhythm.   Pulmonary/Chest: Effort normal and breath sounds normal. No stridor. No respiratory distress.  Abdominal: She exhibits no distension.  Musculoskeletal: She exhibits no edema.  Neurological: She is alert and oriented to person, place, and time. She displays no atrophy. No cranial nerve deficit. She exhibits normal muscle tone. She displays no seizure activity. Coordination normal.  Skin: Skin is warm and dry.  Psychiatric: She has a normal mood and affect.  Nursing note and vitals reviewed.    ED Treatments / Results  Labs (all labs ordered are listed, but only abnormal results are displayed) Labs Reviewed  COMPREHENSIVE METABOLIC PANEL - Abnormal; Notable for the following:       Result Value   Potassium 3.3 (*)    Calcium 8.4 (*)    Total Protein 6.3 (*)    Albumin 3.1 (*)    AST 14 (*)    GFR calc non Af Amer 58 (*)    All other components within normal limits  CBC WITH DIFFERENTIAL/PLATELET - Abnormal; Notable for the following:    WBC 2.5 (*)    RBC 3.78 (*)    Hemoglobin 9.7 (*)    HCT 30.5 (*)    MCH 25.7 (*)    RDW 23.0 (*)    Platelets 15 (*)    Neutro Abs 1.2 (*)    All other components within normal limits  TYPE AND SCREEN  PREPARE PLATELET PHERESIS    Procedures Procedures (including critical care time)  Medications Ordered in ED Medications  0.9 %  sodium chloride infusion (not administered)  HYDROmorphone (DILAUDID) injection 1 mg (1 mg Intramuscular Given 07/25/16 1027)   Chart review notable for history of malignancy, with multiple prior cancer, ongoing therapy for adenocarcinoma of the long, with known metastases. Last  chemotherapy therapy last week.  Initial labs notable for thrombus cytopenia, platelets of 15.  On repeat exam the patient remains in similar condition, denies any bleeding from anywhere. I discussed the patient's thrombus cytopenia with her oncologic team. 3.  Patient will receive 1 unit platelets, transfusion. At this is well tolerated, patient can be discharged, although she does have ongoing neutropenia, she is afebrile, appears well, and again, if this remains the case, will follow up in the clinic.  On re-exam the patient is in similar condition, no new complaints.  Transfusion well tolerated. Initial Impression / Assessment and Plan / ED Course  I have reviewed the triage vital signs and the nursing notes.  Pertinent labs & imaging results that were available during my care of the patient were reviewed by me and considered in my medical decision making (see chart for details).  Final Clinical Impressions(s) / ED Diagnoses  Thrombocytopenia Neck pain Low back pain Neutropenia  CRITICAL CARE Performed by: Carmin Muskrat Total critical care time: 35 minutes Critical care time was exclusive of separately billable procedures and treating other patients. Critical care was necessary to treat or prevent imminent or life-threatening deterioration. Critical care was time spent personally by me on the following activities: development of treatment plan with patient and/or surrogate as well as nursing, discussions with consultants, evaluation of patient's response to treatment, examination of patient, obtaining history from patient or surrogate, ordering and performing treatments and interventions, ordering and review of laboratory studies, ordering and review of radiographic studies, pulse oximetry and re-evaluation of patient's condition.     Carmin Muskrat, MD 07/25/16 1544

## 2016-07-25 NOTE — ED Notes (Signed)
Pt reports that she had nulesta patch placed on 07/17/16 and has since been removed. Pt reports that when she took claritin post chemo she had no pain, but now it is uncontrolled. Pt sts she used voltaren gel, took tramadol and dilaudid PTA w/o relief.

## 2016-07-25 NOTE — Discharge Instructions (Signed)
As discussed, it is important that you follow up as soon as possible with your physician for continued management of your condition. ° °If you develop any new, or concerning changes in your condition, please return to the emergency department immediately. ° °

## 2016-07-27 ENCOUNTER — Telehealth: Payer: Self-pay

## 2016-07-27 ENCOUNTER — Other Ambulatory Visit: Payer: Self-pay | Admitting: Hematology and Oncology

## 2016-07-27 LAB — PREPARE PLATELET PHERESIS: UNIT DIVISION: 0

## 2016-07-27 LAB — BPAM PLATELET PHERESIS
Blood Product Expiration Date: 201803112359
ISSUE DATE / TIME: 201803101412
UNIT TYPE AND RH: 7300

## 2016-07-27 MED ORDER — HYDROMORPHONE HCL 8 MG PO TABS: 8.0000 mg | ORAL_TABLET | Freq: Four times a day (QID) | ORAL | 0 refills | Status: DC | PRN

## 2016-07-27 NOTE — Telephone Encounter (Signed)
I will increase the dose of her pain meds in the next visit and adjust dose of chemo

## 2016-07-27 NOTE — Telephone Encounter (Signed)
Called with below message. Instructed patient per Dr. Alvy Bimler to take Claritin 10 mg daily. Dilaudid Rx ready for pick up. Verbalized understanding.

## 2016-07-27 NOTE — Telephone Encounter (Signed)
Pt called had to go to hospital Saturday. They told her to call and upgrade medications. Pt had gamzar, carbo, onpro on 3/2.  The neulasta shot caused pain in back of neck and low back that was excruciating. On 3/10 pain woke her at 0530, at 0800 she called the nurse call line who sent her to ER. She had taken tramadol and dilaudid before calling the nurse line. ER gave her 2 shots of dilaudid. She came home pain free. Pt took a few more doses of dilaudid to remain pain free. They also gave her platelets for a count of 15.   Chemo again on 16th and 23rd. And pt wants to be appropriately instructed on how to use dilaudid or other measures to prevent ER visits.

## 2016-07-29 ENCOUNTER — Other Ambulatory Visit: Payer: Self-pay | Admitting: *Deleted

## 2016-07-29 NOTE — Patient Outreach (Signed)
[facility]) Care Management  07/29/2016  Lauren [name] November 26, 1950 [id number]   CSW received a request from [name], Telephonic Nurse Case Manager with [facility], requesting to provide patient with financial assistance to obtain Ensure.  CSW was able to thoroughly review patient's EMR (Electronic Medical Record) to determine that patient meets eligibility criteria to receive financial assistance, as an FAF (Financial Assessment Form) has already been completed.  In addition, patient has only received a $25.00 [employer] in the past, making her a candidate to receive an additional $75.00, for a total of $100.00 per fiscal year.  However, patient will only be provided a $50.00 [employer] Card at this time, along with coupons to purchase ensure, in the event that she requires additional financial assistance within the required time-frame.  Lauren [name] agreed to obtain the gift card from Freda Jackson, Care Management Assistant with [facility], after approval was provided by CSW, and hand deliver to patient's daughter, Lauren [name] for purchase of cases of Ensure for patient. Nat Christen, BSW, MSW, LCSW  Licensed Education officer, [facility]  Mailing [facility] [address], [place], [place] [address] Physical Address-[address], [place], [place] [address] Toll Free Main # [phone] Fax # [phone] Cell # [phone]  Office # [phone] [email or web address]

## 2016-07-31 ENCOUNTER — Other Ambulatory Visit: Payer: Self-pay | Admitting: Hematology and Oncology

## 2016-07-31 ENCOUNTER — Other Ambulatory Visit (HOSPITAL_BASED_OUTPATIENT_CLINIC_OR_DEPARTMENT_OTHER): Payer: Medicare HMO

## 2016-07-31 ENCOUNTER — Ambulatory Visit (HOSPITAL_BASED_OUTPATIENT_CLINIC_OR_DEPARTMENT_OTHER): Payer: Medicare HMO

## 2016-07-31 ENCOUNTER — Ambulatory Visit: Payer: Medicare HMO

## 2016-07-31 ENCOUNTER — Other Ambulatory Visit: Payer: Self-pay | Admitting: *Deleted

## 2016-07-31 VITALS — BP 140/70 | HR 85 | Temp 98.4°F | Resp 16

## 2016-07-31 DIAGNOSIS — C3412 Malignant neoplasm of upper lobe, left bronchus or lung: Secondary | ICD-10-CM

## 2016-07-31 DIAGNOSIS — C7931 Secondary malignant neoplasm of brain: Secondary | ICD-10-CM

## 2016-07-31 DIAGNOSIS — C7949 Secondary malignant neoplasm of other parts of nervous system: Secondary | ICD-10-CM

## 2016-07-31 DIAGNOSIS — Z5111 Encounter for antineoplastic chemotherapy: Secondary | ICD-10-CM | POA: Diagnosis not present

## 2016-07-31 LAB — CBC WITH DIFFERENTIAL/PLATELET
BASO%: 0.5 % (ref 0.0–2.0)
BASOS ABS: 0.1 10*3/uL (ref 0.0–0.1)
EOS%: 0.1 % (ref 0.0–7.0)
Eosinophils Absolute: 0 10*3/uL (ref 0.0–0.5)
HCT: 29.8 % — ABNORMAL LOW (ref 34.8–46.6)
HGB: 9.4 g/dL — ABNORMAL LOW (ref 11.6–15.9)
LYMPH%: 5.3 % — ABNORMAL LOW (ref 14.0–49.7)
MCH: 25.6 pg (ref 25.1–34.0)
MCHC: 31.5 g/dL (ref 31.5–36.0)
MCV: 81.2 fL (ref 79.5–101.0)
MONO#: 1 10*3/uL — ABNORMAL HIGH (ref 0.1–0.9)
MONO%: 6.7 % (ref 0.0–14.0)
NEUT#: 13.1 10*3/uL — ABNORMAL HIGH (ref 1.5–6.5)
NEUT%: 87.4 % — AB (ref 38.4–76.8)
Platelets: 185 10*3/uL (ref 145–400)
RBC: 3.67 10*6/uL — ABNORMAL LOW (ref 3.70–5.45)
RDW: 27.2 % — AB (ref 11.2–14.5)
WBC: 15 10*3/uL — ABNORMAL HIGH (ref 3.9–10.3)
lymph#: 0.8 10*3/uL — ABNORMAL LOW (ref 0.9–3.3)

## 2016-07-31 LAB — COMPREHENSIVE METABOLIC PANEL
ALT: 9 U/L (ref 0–55)
AST: 10 U/L (ref 5–34)
Albumin: 2.9 g/dL — ABNORMAL LOW (ref 3.5–5.0)
Alkaline Phosphatase: 141 U/L (ref 40–150)
Anion Gap: 7 mEq/L (ref 3–11)
BUN: 13.3 mg/dL (ref 7.0–26.0)
CHLORIDE: 109 meq/L (ref 98–109)
CO2: 26 meq/L (ref 22–29)
Calcium: 8.9 mg/dL (ref 8.4–10.4)
Creatinine: 1.2 mg/dL — ABNORMAL HIGH (ref 0.6–1.1)
EGFR: 55 mL/min/{1.73_m2} — ABNORMAL LOW (ref >90)
GLUCOSE: 104 mg/dL (ref 70–140)
POTASSIUM: 3.8 meq/L (ref 3.5–5.1)
SODIUM: 141 meq/L (ref 136–145)
Total Bilirubin: 0.34 mg/dL (ref 0.20–1.20)
Total Protein: 6.3 g/dL — ABNORMAL LOW (ref 6.4–8.3)

## 2016-07-31 MED ORDER — SODIUM CHLORIDE 0.9% FLUSH
10.0000 mL | INTRAVENOUS | Status: DC | PRN
  Administered 2016-07-31: 10 mL
  Filled 2016-07-31: qty 10

## 2016-07-31 MED ORDER — PALONOSETRON HCL INJECTION 0.25 MG/5ML
INTRAVENOUS | Status: AC
  Filled 2016-07-31: qty 5

## 2016-07-31 MED ORDER — SODIUM CHLORIDE 0.9 % IV SOLN
130.0000 mg | Freq: Once | INTRAVENOUS | Status: AC
  Administered 2016-07-31: 130 mg via INTRAVENOUS
  Filled 2016-07-31: qty 13

## 2016-07-31 MED ORDER — SODIUM CHLORIDE 0.9 % IV SOLN
500.0000 mg/m2 | Freq: Once | INTRAVENOUS | Status: AC
  Administered 2016-07-31: 912 mg via INTRAVENOUS
  Filled 2016-07-31: qty 23.99

## 2016-07-31 MED ORDER — PALONOSETRON HCL INJECTION 0.25 MG/5ML
0.2500 mg | Freq: Once | INTRAVENOUS | Status: AC
  Administered 2016-07-31: 0.25 mg via INTRAVENOUS

## 2016-07-31 MED ORDER — SODIUM CHLORIDE 0.9 % IJ SOLN
10.0000 mL | INTRAMUSCULAR | Status: DC | PRN
  Administered 2016-07-31: 10 mL via INTRAVENOUS
  Filled 2016-07-31: qty 10

## 2016-07-31 MED ORDER — SODIUM CHLORIDE 0.9 % IV SOLN
Freq: Once | INTRAVENOUS | Status: AC
  Administered 2016-07-31: 15:00:00 via INTRAVENOUS

## 2016-07-31 MED ORDER — DEXAMETHASONE SODIUM PHOSPHATE 10 MG/ML IJ SOLN
10.0000 mg | Freq: Once | INTRAMUSCULAR | Status: AC
  Administered 2016-07-31: 10 mg via INTRAVENOUS

## 2016-07-31 MED ORDER — HEPARIN SOD (PORK) LOCK FLUSH 100 UNIT/ML IV SOLN
500.0000 [IU] | Freq: Once | INTRAVENOUS | Status: AC | PRN
  Administered 2016-07-31: 500 [IU]
  Filled 2016-07-31: qty 5

## 2016-07-31 MED ORDER — DEXAMETHASONE SODIUM PHOSPHATE 10 MG/ML IJ SOLN
INTRAMUSCULAR | Status: AC
  Filled 2016-07-31: qty 1

## 2016-07-31 MED ORDER — CARBOPLATIN CHEMO INTRADERMAL TEST DOSE 100MCG/0.02ML
100.0000 ug | Freq: Once | INTRADERMAL | Status: AC
  Administered 2016-07-31: 100 ug via INTRADERMAL
  Filled 2016-07-31: qty 0.02

## 2016-07-31 NOTE — Patient Outreach (Signed)
[facility]) Care Management  07/31/2016   Lauren [name] [birth year] [phone]  RN Health Coach telephone call to patient.  [employer] compliance verified. Per patient she is sitting in lobby waiting for her call to go back for infusing treatment. Per patient she is feeling better  today.  She is in the green zone today.Per patient she went to the Emergency Department  On [phone] due to severe pain from the drug neulasta.Patient stated the pain medication she had was not working till they gave her the medicines in emergency room. Patient is under a lot of stress. She is awaiting placement for son with [facility]. He is currently staying with her. Patient has agreed to follow up outreach call.    Current Medications:  Current Outpatient Prescriptions  Medication Sig Dispense Refill  . aspirin 81 MG EC tablet Take 81 mg by mouth daily.     Marland Kitchen BYSTOLIC 10 MG tablet Take 10 mg by mouth daily.     . cholecalciferol (VITAMIN D) 1000 units tablet Take 1,000 Units by mouth daily.     Marland Kitchen COLACE 100 MG capsule Take 100 mg by mouth daily as needed for mild constipation.     Marland Kitchen HYDROmorphone (DILAUDID) 8 MG tablet Take 1 tablet (8 mg total) by mouth every 6 (six) hours as needed for severe pain. 60 tablet 0  . ipratropium (ATROVENT HFA) 17 MCG/ACT inhaler Inhale 2 puffs into the lungs every 4 (four) hours as needed for wheezing (Cough). 1 Inhaler 12  . lidocaine-prilocaine (EMLA) cream Apply topically as needed. Apply to porta cath site one hour prior to needle stick. 30 g 6  . nitroGLYCERIN (NITROSTAT) 0.4 MG SL tablet Place 1 tablet (0.4 mg total) under the tongue every 5 (five) minutes as needed. For chest pain 25 tablet 5  . omeprazole (PRILOSEC) 40 MG capsule Take 40 mg by mouth daily.     . ondansetron (ZOFRAN ODT) 4 MG disintegrating tablet Take 1 tablet (4 mg total) by mouth every 8 (eight) hours as needed for nausea or vomiting. 20 tablet 0  . pravastatin (PRAVACHOL) 40 MG  tablet Take 40 mg by mouth daily.     . predniSONE (DELTASONE) 10 MG tablet Take 1 tablet (10 mg total) by mouth daily with breakfast. 30 tablet 1  . umeclidinium-vilanterol (ANORO ELLIPTA) 62.5-25 MCG/INH AEPB Inhale 1 puff into the lungs daily. 3 each 3  . VENTOLIN HFA 108 (90 Base) MCG/ACT inhaler Inhale 2 puffs into the lungs every 4 (four) hours as needed for wheezing or shortness of breath.      No current facility-administered medications for this visit.    Facility-Administered Medications Ordered in Other Visits  Medication Dose Route Frequency Provider Last Rate Last Dose  . 0.9 %  sodium chloride infusion   Intravenous Once Heath Lark, MD      . CARBOplatin (PARAPLATIN) 130 mg in sodium chloride 0.9 % 100 mL chemo infusion  130 mg Intravenous Once Heath Lark, MD      . dexamethasone (DECADRON) injection 10 mg  10 mg Intravenous Once Heath Lark, MD      . gemcitabine (GEMZAR) 912 mg in sodium chloride 0.9 % 100 mL chemo infusion  500 mg/m2 (Treatment Plan Recorded) Intravenous Once Heath Lark, MD      . heparin lock flush 100 unit/mL  500 Units Intracatheter Once PRN Heath Lark, MD      . palonosetron (ALOXI) injection 0.25 mg  0.25 mg Intravenous Once Ni  Alvy Bimler, MD      . sodium chloride 0.9 % injection 10 mL  10 mL Intravenous PRN Heath Lark, MD   10 mL at 09/18/15 1345  . sodium chloride 0.9 % injection 10 mL  10 mL Intravenous PRN Heath Lark, MD   10 mL at 04/24/16 1136  . sodium chloride flush (NS) 0.9 % injection 10 mL  10 mL Intracatheter PRN Heath Lark, MD        Functional Status:  In your present state of health, do you have any difficulty performing the following activities: 07/31/2016 07/03/2016  Hearing? [name]  Vision? Y Y  Difficulty concentrating or making decisions? [name]  Walking or climbing stairs? Y Y  Dressing or bathing? N N  Doing errands, shopping? [name]  Preparing Food and eating ? N N  Using the Toilet? N N  In the past six months, have you accidently leaked  urine? Y Y  Do you have problems with loss of bowel control? N N  Managing your Medications? N N  Managing your Finances? N N  Housekeeping or managing your Housekeeping? [name]  Some recent data might be hidden    Fall/Depression Screening: PHQ 2/9 Scores 07/31/2016 07/03/2016 06/25/2016 06/02/2016 05/04/2016 05/01/2016 04/23/2016  PHQ - 2 Score 0 0 1 0 0 0 0  PHQ- 9 Score - - - - - - -  Exception Documentation - - - - - - -   THN CM Care Plan Problem One     Most Recent Value  Care Plan Problem One  knowledge deficit in self management of COPD  Role Documenting the Problem One  [place] for Problem One  Active  THN Long Term Goal (31-90 days)  Patient will not have any readmissions for COPD within the next 90 days  THN Long Term Goal Start Date  07/31/16  Interventions for Problem One Long Term Goal  RN reminded patient to keep appointments with PCP and oncology and pulmonary specialist. RN reminded patient to take medications as per order.  THN CM Short Term Goal #1 (0-30 days)  Patient will report better nutritional intake within the next 30 days  THN CM Short Term Goal #1 Start Date  07/31/16  Interventions for Short Term Goal #1  RN discussed with patient about eating small meals and snacks. RN discussed with patient about drinking more nutritional supplements. RN will send ensure coupons to offsetthe cost  THN CM Short Term Goal #5 Start Date  07/31/16  Interventions for Short Term Goal #5  RN sent patient a list of chair exercises with pictures. RN discussed with patient the importance of building up her strength. RN discusses with patient about taking short walks      Assessment:  Patient will continue to benefit from [employer] telephonic outreach for education and  support for congestive heart failure self management.  Plan:  RN sent coupons for ensure to help with financial cost of ensure. Social worker provided patient with gift card to assist with  Purchasing  food and ensure. RN will follow up within the month of April.  [facility] [phone]

## 2016-07-31 NOTE — Patient Instructions (Signed)
[facility] Discharge Instructions for Patients Receiving Chemotherapy  Today you received the following chemotherapy agents Gemzar, Carboplatin.   To help prevent nausea and vomiting after your treatment, we encourage you to take your nausea medication as prescribed.    If you develop nausea and vomiting that is not controlled by your nausea medication, call the clinic.   BELOW ARE SYMPTOMS THAT SHOULD BE REPORTED IMMEDIATELY:  *FEVER GREATER THAN 100.5 F  *CHILLS WITH OR WITHOUT FEVER  NAUSEA AND VOMITING THAT IS NOT CONTROLLED WITH YOUR NAUSEA MEDICATION  *UNUSUAL SHORTNESS OF BREATH  *UNUSUAL BRUISING OR BLEEDING  TENDERNESS IN MOUTH AND THROAT WITH OR WITHOUT PRESENCE OF ULCERS  *URINARY PROBLEMS  *BOWEL PROBLEMS  UNUSUAL RASH Items with * indicate a potential emergency and should be followed up as soon as possible.  Feel free to call the clinic you have any questions or concerns. The clinic phone number is [phone].  Please show the CHEMO ALERT CARD at check-in to the Emergency Department and triage nurse.   

## 2016-08-04 ENCOUNTER — Encounter: Payer: Self-pay | Admitting: *Deleted

## 2016-08-07 ENCOUNTER — Other Ambulatory Visit: Payer: Self-pay | Admitting: Oncology

## 2016-08-07 ENCOUNTER — Ambulatory Visit (HOSPITAL_BASED_OUTPATIENT_CLINIC_OR_DEPARTMENT_OTHER): Payer: Medicare HMO

## 2016-08-07 ENCOUNTER — Ambulatory Visit: Payer: Medicare HMO

## 2016-08-07 ENCOUNTER — Other Ambulatory Visit (HOSPITAL_BASED_OUTPATIENT_CLINIC_OR_DEPARTMENT_OTHER): Payer: Medicare HMO

## 2016-08-07 VITALS — BP 131/59 | HR 87 | Temp 98.2°F | Resp 18

## 2016-08-07 DIAGNOSIS — C7949 Secondary malignant neoplasm of other parts of nervous system: Secondary | ICD-10-CM

## 2016-08-07 DIAGNOSIS — C7931 Secondary malignant neoplasm of brain: Secondary | ICD-10-CM

## 2016-08-07 DIAGNOSIS — Z5111 Encounter for antineoplastic chemotherapy: Secondary | ICD-10-CM | POA: Diagnosis not present

## 2016-08-07 DIAGNOSIS — C3412 Malignant neoplasm of upper lobe, left bronchus or lung: Secondary | ICD-10-CM

## 2016-08-07 DIAGNOSIS — Z5189 Encounter for other specified aftercare: Secondary | ICD-10-CM

## 2016-08-07 LAB — CBC WITH DIFFERENTIAL/PLATELET
BASO%: 0.3 % (ref 0.0–2.0)
Basophils Absolute: 0 10*3/uL (ref 0.0–0.1)
EOS%: 0 % (ref 0.0–7.0)
Eosinophils Absolute: 0 10*3/uL (ref 0.0–0.5)
HCT: 28.4 % — ABNORMAL LOW (ref 34.8–46.6)
HGB: 8.7 g/dL — ABNORMAL LOW (ref 11.6–15.9)
LYMPH%: 12.3 % — ABNORMAL LOW (ref 14.0–49.7)
MCH: 25.4 pg (ref 25.1–34.0)
MCHC: 30.6 g/dL — ABNORMAL LOW (ref 31.5–36.0)
MCV: 83 fL (ref 79.5–101.0)
MONO#: 0.7 10*3/uL (ref 0.1–0.9)
MONO%: 9.7 % (ref 0.0–14.0)
NEUT#: 5.5 10*3/uL (ref 1.5–6.5)
NEUT%: 77.7 % — AB (ref 38.4–76.8)
Platelets: 239 10*3/uL (ref 145–400)
RBC: 3.42 10*6/uL — ABNORMAL LOW (ref 3.70–5.45)
RDW: 23.3 % — ABNORMAL HIGH (ref 11.2–14.5)
WBC: 7 10*3/uL (ref 3.9–10.3)
lymph#: 0.9 10*3/uL (ref 0.9–3.3)
nRBC: 0 % (ref 0–0)

## 2016-08-07 LAB — TECHNOLOGIST REVIEW

## 2016-08-07 LAB — COMPREHENSIVE METABOLIC PANEL
ALBUMIN: 3 g/dL — AB (ref 3.5–5.0)
ALK PHOS: 120 U/L (ref 40–150)
ALT: 10 U/L (ref 0–55)
AST: 10 U/L (ref 5–34)
Anion Gap: 9 mEq/L (ref 3–11)
BUN: 17.5 mg/dL (ref 7.0–26.0)
CO2: 23 mEq/L (ref 22–29)
Calcium: 8.8 mg/dL (ref 8.4–10.4)
Chloride: 108 mEq/L (ref 98–109)
Creatinine: 1.1 mg/dL (ref 0.6–1.1)
EGFR: 63 mL/min/{1.73_m2} — AB (ref >90)
Glucose: 91 mg/dl (ref 70–140)
POTASSIUM: 4 meq/L (ref 3.5–5.1)
SODIUM: 140 meq/L (ref 136–145)
Total Bilirubin: 0.44 mg/dL (ref 0.20–1.20)
Total Protein: 6.5 g/dL (ref 6.4–8.3)

## 2016-08-07 MED ORDER — HEPARIN SOD (PORK) LOCK FLUSH 100 UNIT/ML IV SOLN
500.0000 [IU] | Freq: Once | INTRAVENOUS | Status: AC | PRN
  Administered 2016-08-07: 500 [IU]
  Filled 2016-08-07: qty 5

## 2016-08-07 MED ORDER — PALONOSETRON HCL INJECTION 0.25 MG/5ML
INTRAVENOUS | Status: AC
  Filled 2016-08-07: qty 5

## 2016-08-07 MED ORDER — GEMCITABINE HCL CHEMO INJECTION 1 GM/26.3ML
500.0000 mg/m2 | Freq: Once | INTRAVENOUS | Status: AC
  Administered 2016-08-07: 912 mg via INTRAVENOUS
  Filled 2016-08-07: qty 23.99

## 2016-08-07 MED ORDER — SODIUM CHLORIDE 0.9 % IV SOLN
Freq: Once | INTRAVENOUS | Status: AC
  Administered 2016-08-07: 14:00:00 via INTRAVENOUS

## 2016-08-07 MED ORDER — DEXAMETHASONE SODIUM PHOSPHATE 10 MG/ML IJ SOLN
INTRAMUSCULAR | Status: AC
  Filled 2016-08-07: qty 1

## 2016-08-07 MED ORDER — CARBOPLATIN CHEMO INTRADERMAL TEST DOSE 100MCG/0.02ML
100.0000 ug | Freq: Once | INTRADERMAL | Status: AC
  Administered 2016-08-07: 100 ug via INTRADERMAL
  Filled 2016-08-07: qty 0.02

## 2016-08-07 MED ORDER — SODIUM CHLORIDE 0.9 % IJ SOLN
10.0000 mL | INTRAMUSCULAR | Status: DC | PRN
  Administered 2016-08-07: 10 mL via INTRAVENOUS
  Filled 2016-08-07: qty 10

## 2016-08-07 MED ORDER — SODIUM CHLORIDE 0.9% FLUSH
10.0000 mL | INTRAVENOUS | Status: DC | PRN
  Administered 2016-08-07: 10 mL
  Filled 2016-08-07: qty 10

## 2016-08-07 MED ORDER — PALONOSETRON HCL INJECTION 0.25 MG/5ML
0.2500 mg | Freq: Once | INTRAVENOUS | Status: AC
  Administered 2016-08-07: 0.25 mg via INTRAVENOUS

## 2016-08-07 MED ORDER — DEXAMETHASONE SODIUM PHOSPHATE 10 MG/ML IJ SOLN
10.0000 mg | Freq: Once | INTRAMUSCULAR | Status: AC
  Administered 2016-08-07: 10 mg via INTRAVENOUS

## 2016-08-07 MED ORDER — PEGFILGRASTIM 6 MG/0.6ML ~~LOC~~ PSKT
6.0000 mg | PREFILLED_SYRINGE | Freq: Once | SUBCUTANEOUS | Status: AC
  Administered 2016-08-07: 6 mg via SUBCUTANEOUS
  Filled 2016-08-07: qty 0.6

## 2016-08-07 MED ORDER — SODIUM CHLORIDE 0.9 % IV SOLN
134.5600 mg | Freq: Once | INTRAVENOUS | Status: AC
  Administered 2016-08-07: 130 mg via INTRAVENOUS
  Filled 2016-08-07: qty 13

## 2016-08-07 MED FILL — HYDROmorphone HCL 8 MG TABS: 8 | 15 days supply | Qty: 60 | Fill #0

## 2016-08-07 NOTE — Progress Notes (Signed)
[place] Counseling Note  First individual session with pt. OW completed intake paperwork and reported no current or history of SI.  OW discussed presenting concern: she and her doctor have determined that she is advanced stages of cancer and her "quality of life would likely be higher if she were to stop treatment." Client would like to stop tx, so that she can enjoy the time she has left without the side effects of chemo. OW reports being at peace with this, as she has been "dealing with cancer since 2008," and would like to be afforded the choice to die peacefully and with dignity.  However, OW experiences her family as unaccepting of this decision.   Her goals for counseling are to help her family come to terms with the fact that she's dying and to stop tx despite their disapproval when she's ready.  Next appointment: 4/13 at 2p.  Westly Pam, [place] [facility] [place]

## 2016-08-13 ENCOUNTER — Encounter: Payer: Self-pay | Admitting: Family Medicine

## 2016-08-13 ENCOUNTER — Ambulatory Visit (INDEPENDENT_AMBULATORY_CARE_PROVIDER_SITE_OTHER): Payer: Medicare HMO | Admitting: Family Medicine

## 2016-08-13 VITALS — BP 130/78 | HR 87 | Temp 98.7°F | Ht 64.0 in | Wt 159.0 lb

## 2016-08-13 DIAGNOSIS — F430 Acute stress reaction: Secondary | ICD-10-CM | POA: Diagnosis not present

## 2016-08-13 DIAGNOSIS — Z7189 Other specified counseling: Secondary | ICD-10-CM

## 2016-08-13 NOTE — Progress Notes (Signed)
30 minutes face to face where spent in total with counselingwith patient about her conscerns around her cancer, her prognosis, care of her sons, stress placed on her dgt, how she copes.  Psychological support provided.  Pt has a positive outlook but a realistic one as well.   No symptoms of major mood disorder.  Will see pt back in 4 to 6 weeks or prn.

## 2016-08-13 NOTE — Patient Instructions (Signed)
I am so glad to see you strong.   I will see you in 6 weeks.   Let me know if I can be of assistance to you before then.

## 2016-08-21 ENCOUNTER — Ambulatory Visit (HOSPITAL_BASED_OUTPATIENT_CLINIC_OR_DEPARTMENT_OTHER): Payer: Medicare HMO | Admitting: Hematology and Oncology

## 2016-08-21 ENCOUNTER — Encounter: Payer: Self-pay | Admitting: Hematology and Oncology

## 2016-08-21 ENCOUNTER — Ambulatory Visit (HOSPITAL_BASED_OUTPATIENT_CLINIC_OR_DEPARTMENT_OTHER): Payer: Medicare HMO

## 2016-08-21 ENCOUNTER — Other Ambulatory Visit: Payer: Self-pay | Admitting: Radiation Therapy

## 2016-08-21 ENCOUNTER — Ambulatory Visit: Payer: Medicare HMO

## 2016-08-21 ENCOUNTER — Telehealth: Payer: Self-pay | Admitting: Hematology and Oncology

## 2016-08-21 ENCOUNTER — Other Ambulatory Visit (HOSPITAL_BASED_OUTPATIENT_CLINIC_OR_DEPARTMENT_OTHER): Payer: Medicare HMO

## 2016-08-21 DIAGNOSIS — Z720 Tobacco use: Secondary | ICD-10-CM

## 2016-08-21 DIAGNOSIS — C3412 Malignant neoplasm of upper lobe, left bronchus or lung: Secondary | ICD-10-CM | POA: Diagnosis not present

## 2016-08-21 DIAGNOSIS — C7949 Secondary malignant neoplasm of other parts of nervous system: Secondary | ICD-10-CM

## 2016-08-21 DIAGNOSIS — Z5111 Encounter for antineoplastic chemotherapy: Secondary | ICD-10-CM | POA: Diagnosis not present

## 2016-08-21 DIAGNOSIS — T451X5A Adverse effect of antineoplastic and immunosuppressive drugs, initial encounter: Secondary | ICD-10-CM

## 2016-08-21 DIAGNOSIS — C7931 Secondary malignant neoplasm of brain: Secondary | ICD-10-CM

## 2016-08-21 DIAGNOSIS — F17200 Nicotine dependence, unspecified, uncomplicated: Secondary | ICD-10-CM

## 2016-08-21 DIAGNOSIS — D72829 Elevated white blood cell count, unspecified: Secondary | ICD-10-CM | POA: Diagnosis not present

## 2016-08-21 DIAGNOSIS — D6481 Anemia due to antineoplastic chemotherapy: Secondary | ICD-10-CM

## 2016-08-21 LAB — CBC WITH DIFFERENTIAL/PLATELET
BASO%: 0.1 % (ref 0.0–2.0)
BASOS ABS: 0 10*3/uL (ref 0.0–0.1)
EOS ABS: 0 10*3/uL (ref 0.0–0.5)
EOS%: 0.3 % (ref 0.0–7.0)
HEMATOCRIT: 31.4 % — AB (ref 34.8–46.6)
HGB: 9.6 g/dL — ABNORMAL LOW (ref 11.6–15.9)
LYMPH%: 7.7 % — AB (ref 14.0–49.7)
MCH: 26.3 pg (ref 25.1–34.0)
MCHC: 30.6 g/dL — AB (ref 31.5–36.0)
MCV: 86 fL (ref 79.5–101.0)
MONO#: 1.2 10*3/uL — ABNORMAL HIGH (ref 0.1–0.9)
MONO%: 8.6 % (ref 0.0–14.0)
NEUT%: 83.3 % — ABNORMAL HIGH (ref 38.4–76.8)
NEUTROS ABS: 11.1 10*3/uL — AB (ref 1.5–6.5)
NRBC: 0 % (ref 0–0)
PLATELETS: 252 10*3/uL (ref 145–400)
RBC: 3.65 10*6/uL — ABNORMAL LOW (ref 3.70–5.45)
RDW: 22.9 % — ABNORMAL HIGH (ref 11.2–14.5)
WBC: 13.3 10*3/uL — AB (ref 3.9–10.3)
lymph#: 1 10*3/uL (ref 0.9–3.3)

## 2016-08-21 LAB — COMPREHENSIVE METABOLIC PANEL
ALBUMIN: 3 g/dL — AB (ref 3.5–5.0)
ALK PHOS: 168 U/L — AB (ref 40–150)
ALT: 8 U/L (ref 0–55)
AST: 10 U/L (ref 5–34)
Anion Gap: 9 mEq/L (ref 3–11)
BUN: 9.5 mg/dL (ref 7.0–26.0)
CHLORIDE: 109 meq/L (ref 98–109)
CO2: 24 mEq/L (ref 22–29)
Calcium: 8.9 mg/dL (ref 8.4–10.4)
Creatinine: 1.1 mg/dL (ref 0.6–1.1)
EGFR: 60 mL/min/{1.73_m2} — ABNORMAL LOW (ref >90)
GLUCOSE: 99 mg/dL (ref 70–140)
POTASSIUM: 4 meq/L (ref 3.5–5.1)
SODIUM: 142 meq/L (ref 136–145)
Total Bilirubin: 0.43 mg/dL (ref 0.20–1.20)
Total Protein: 6.6 g/dL (ref 6.4–8.3)

## 2016-08-21 MED ORDER — HYDROMORPHONE HCL 8 MG PO TABS: 8.0000 mg | ORAL_TABLET | Freq: Four times a day (QID) | ORAL | 0 refills | Status: AC | PRN

## 2016-08-21 MED ORDER — SODIUM CHLORIDE 0.9% FLUSH
10.0000 mL | INTRAVENOUS | Status: DC | PRN
  Administered 2016-08-21: 10 mL
  Filled 2016-08-21: qty 10

## 2016-08-21 MED ORDER — SODIUM CHLORIDE 0.9 % IV SOLN
134.5600 mg | Freq: Once | INTRAVENOUS | Status: AC
  Administered 2016-08-21: 130 mg via INTRAVENOUS
  Filled 2016-08-21: qty 13

## 2016-08-21 MED ORDER — SODIUM CHLORIDE 0.9 % IV SOLN
500.0000 mg/m2 | Freq: Once | INTRAVENOUS | Status: AC
  Administered 2016-08-21: 912 mg via INTRAVENOUS
  Filled 2016-08-21: qty 23.99

## 2016-08-21 MED ORDER — SODIUM CHLORIDE 0.9 % IV SOLN
Freq: Once | INTRAVENOUS | Status: AC
  Administered 2016-08-21: 12:00:00 via INTRAVENOUS

## 2016-08-21 MED ORDER — CARBOPLATIN CHEMO INTRADERMAL TEST DOSE 100MCG/0.02ML
100.0000 ug | Freq: Once | INTRADERMAL | Status: AC
  Administered 2016-08-21: 100 ug via INTRADERMAL
  Filled 2016-08-21: qty 0.02

## 2016-08-21 MED ORDER — PALONOSETRON HCL INJECTION 0.25 MG/5ML
INTRAVENOUS | Status: AC
  Filled 2016-08-21: qty 5

## 2016-08-21 MED ORDER — PALONOSETRON HCL INJECTION 0.25 MG/5ML
0.2500 mg | Freq: Once | INTRAVENOUS | Status: AC
  Administered 2016-08-21: 0.25 mg via INTRAVENOUS

## 2016-08-21 MED ORDER — HEPARIN SOD (PORK) LOCK FLUSH 100 UNIT/ML IV SOLN
500.0000 [IU] | Freq: Once | INTRAVENOUS | Status: AC | PRN
  Administered 2016-08-21: 500 [IU]
  Filled 2016-08-21: qty 5

## 2016-08-21 MED ORDER — DEXAMETHASONE SODIUM PHOSPHATE 10 MG/ML IJ SOLN
INTRAMUSCULAR | Status: AC
  Filled 2016-08-21: qty 1

## 2016-08-21 MED ORDER — DEXAMETHASONE SODIUM PHOSPHATE 10 MG/ML IJ SOLN
10.0000 mg | Freq: Once | INTRAMUSCULAR | Status: AC
  Administered 2016-08-21: 10 mg via INTRAVENOUS

## 2016-08-21 MED FILL — HYDROmorphone HCL 8 MG TABS: 8 | 15 days supply | Qty: 60 | Fill #0

## 2016-08-21 NOTE — Assessment & Plan Note (Signed)
Her most recent MRI in December 2017 showed no evidence of disease recurrence. Plan to continue surveillance imaging study every 3-4 months

## 2016-08-21 NOTE — Assessment & Plan Note (Signed)
She tolerated treatment well without major side effects. We will proceed with more treatment. I plan to see her back next month for further supportive care. Due to history recent neutropenia and infection, she will get G-CSF support after day 8 of treatment every cycle

## 2016-08-21 NOTE — Progress Notes (Signed)
[facility] OFFICE PROGRESS NOTE  Patient Care Team: [name], MD as PCP - General (Family Medicine) Gaye Pollack, MD (Cardiothoracic Surgery) Dickie La, MD (Family Medicine) Heath Lark, MD as Consulting Physician (Hematology and Oncology) Verlin Grills, RN as [facility] Leighton Ruff, [place] (Optometry) Milus Banister, MD as Consulting Physician (Gastroenterology) Melida Quitter, MD as Consulting Physician (Otolaryngology)  SUMMARY OF ONCOLOGIC HISTORY: Oncology History   Colon cancer   Primary site: Colon and Rectum (Left)   Staging method: AJCC 7th Edition   Clinical: Stage I (T2, N0, M0) signed by Heath Lark, MD on 05/31/2013  2:39 PM   Pathologic: Stage I (T2, N0, cM0) signed by Heath Lark, MD on 05/31/2013  2:39 PM   Summary: Stage I (T2, N0, cM0) Lung cancer, EGFR/ALK negative, recurrence after initial resection to LN and brain   Primary site: Lung (Left)   Staging method: AJCC 7th Edition   Clinical: Stage IV (T1, N2, M1b) signed by Heath Lark, MD on 05/31/2013  2:26 PM   Pathologic: Stage IV (T1, N2, M1b) signed by Heath Lark, MD on 05/31/2013  2:26 PM   Summary: Stage IV (T1, N2, M1b)       Cancer of upper lobe of left lung, Adenocarcinoma   03/01/2007 Procedure    Colonoscopy revealed abnormalities and biopsy show high-grade dysplasia      04/01/2007 Surgery    She underwent sigmoid resection which showed T2 N0 colon cancer, and negative margins and all of 17 lymph nodes were negative      02/29/2008 Procedure    Repeat surveillance colonoscopy was negative.      06/16/2010 Surgery    She underwent left upper lobectomy we show well-differentiated adenocarcinoma of the lung, T1, N0, M0      03/18/2011 Procedure    Repeat colonoscopy show multiple polyps but there were benign      10/19/2011 Procedure    Biopsy of mediastinal lymph node came back positive for recurrence of lung cancer, EGFR and ALK  negative      11/16/2011 - 12/14/2011 Chemotherapy    She received concurrent chemoradiation therapy with weekly carboplatin and Taxol.      11/16/2011 - 12/29/2011 Radiation Therapy    She received radiation therapy with weekly chemotherapy      02/15/2012 Imaging    MR of the brain showed a new intracranial metastases. This was subsequently treated with stereotactic radiosurgery.      03/07/2012 - 04/12/2013 Chemotherapy    She received chemotherapy with maintainence Alimta every 3 weeks. Chemotherapy was discontinued due to profound fatigue      03/02/2013 Procedure    She had therapeutic ultrasound guidance thoracentesis for pleural effusion that came back negative for cancer      05/04/2013 Procedure    She had repeat ultrasound-guided thoracentesis again and cytology was negative      05/29/2013 Imaging    Repeat CT scan of the chest, abdomen and pelvis show no evidence of disease but persistent right-sided pleural effusion      06/02/2013 Surgery    The patient had placement of Pleurx catheter and subsequently underwent pleurodesis.      09/22/2013 Imaging    Repeat CT scan show no evidence of active disease. There are nonspecific lymphadenopathy and she is placed on observation.      03/23/2014 Imaging    Repeat CT scan of the chest, abdomen and pelvis show recurrence of cancer  with widespread bilateral pulmonary metastasis.      05/14/2014 Imaging    Imaging of the chest and brain were repeated due to delay of initiation of chemotherapy. Overall, chest CT scan show stable disease. MRI of the head was negative for recurrence      05/16/2014 - 07/18/2014 Chemotherapy     she completed 4 cycles of combination chemotherapy with carboplatin and Alimta      07/16/2014 Imaging     repeat CT scan of the chest, abdomen and pelvis show regression in the size of lung nodules.      08/29/2014 - 08/14/2015 Chemotherapy    She received maintenance Alimta      10/16/2014 Imaging      repeat CT scan of the chest, abdomen and pelvis show regression in the size of lung nodules.      01/29/2015 Imaging    Repeat CT scan showed stable disease      02/01/2015 Imaging    MRI brain is negative      04/30/2015 Imaging    CT scan of the chest, abdomen and pelvis showed stable disease      07/25/2015 Imaging    MRI brain showed no evidence of new disease      09/09/2015 Imaging    CT chest showed interval increase in size of large RIGHT upper lobe nodule is most consistent with lung cancer recurrence.      09/18/2015 - 03/26/2016 Chemotherapy    She started on Nivolumab      10/22/2015 Imaging    Screening mammogram showed mild abnormality      10/31/2015 Imaging    Diagnostic mammogram showed mild calcification at the right upper outer breast      11/27/2015 Imaging    Stable post treatment related changes of left lower lobectomy and radiation therapy redemonstrated, as above. No definite findings to suggest local recurrence of disease or metastatic disease on today's examination      01/24/2016 Imaging    MR brain showed continued stable appearance of two small treated brain metastases. No new or progressive metastatic disease to the brain.      04/15/2016 Imaging    Ct scan showed findings highly suspicious for recurrent tumor in the mediastinum surrounding the right mainstem bronchus and in the region of the azygoesophageal recess. Diffuse pulmonary metastatic disease with new and progressive nodules since the prior examination.      04/24/2016 -  Chemotherapy    The patient had chemotherapy with gemzar and Carboplatin      04/30/2016 Imaging    Decreased contrast enhancement of the two treated metastases in the right frontal and left occipital lobes. No new lesions.      06/01/2016 Imaging    CXR showed numerous metastatic nodules have progressed compared to the most recent comparison chest x-ray of 07/15/2015. Majority of these were noted on the 04/15/2016  chest CT. No segmental infiltrate or pulmonary edema.      06/19/2016 Adverse Reaction    Treatment is placed on hold today due to neutropenia and the patient not feeling well      06/29/2016 Imaging    Interval decrease in size of metastatic pulmonary nodules. 2. Persistent but slightly improved right hilar/mediastinal disease. No progressive findings. 3. No findings for upper abdominal metastatic disease       Brain metastases treated with radiosurgery   02/26/2012 Initial Diagnosis    Brain metastases treated with radiosurgery       INTERVAL HISTORY:  Please see below for problem oriented charting. She returns for further follow-up She feels well Denies recent nausea, vomiting, mucositis or side effects of treatment She continues to have mild chronic nonproductive cough likely due to COPD and smoking.  She is not interested to quit  REVIEW OF SYSTEMS:   Constitutional: Denies fevers, chills or abnormal weight loss Eyes: Denies blurriness of vision Ears, nose, mouth, throat, and face: Denies mucositis or sore throat Cardiovascular: Denies palpitation, chest discomfort or lower extremity swelling Gastrointestinal:  Denies nausea, heartburn or change in bowel habits Skin: Denies abnormal skin rashes Lymphatics: Denies new lymphadenopathy or easy bruising Neurological:Denies numbness, tingling or new weaknesses Behavioral/Psych: Mood is stable, no new changes  All other systems were reviewed with the patient and are negative.  I have reviewed the past medical history, past surgical history, social history and family history with the patient and they are unchanged from previous note.  ALLERGIES:  is allergic to adhesive [tape] and lisinopril.  MEDICATIONS:  Current Outpatient Prescriptions  Medication Sig Dispense Refill  . aspirin 81 MG EC tablet Take 81 mg by mouth daily.     Marland Kitchen BYSTOLIC 10 MG tablet Take 10 mg by mouth daily.     . cholecalciferol (VITAMIN D) 1000 units  tablet Take 1,000 Units by mouth daily.     Marland Kitchen COLACE 100 MG capsule Take 100 mg by mouth daily as needed for mild constipation.     Marland Kitchen HYDROmorphone (DILAUDID) 8 MG tablet Take 1 tablet (8 mg total) by mouth every 6 (six) hours as needed for severe pain. 60 tablet 0  . ipratropium (ATROVENT HFA) 17 MCG/ACT inhaler Inhale 2 puffs into the lungs every 4 (four) hours as needed for wheezing (Cough). 1 Inhaler 12  . lidocaine-prilocaine (EMLA) cream Apply topically as needed. Apply to porta cath site one hour prior to needle stick. 30 g 6  . omeprazole (PRILOSEC) 40 MG capsule Take 40 mg by mouth daily.     . pravastatin (PRAVACHOL) 40 MG tablet Take 40 mg by mouth daily.     . predniSONE (DELTASONE) 10 MG tablet Take 1 tablet (10 mg total) by mouth daily with breakfast. 30 tablet 1  . umeclidinium-vilanterol (ANORO ELLIPTA) 62.5-25 MCG/INH AEPB Inhale 1 puff into the lungs daily. 3 each 3  . VENTOLIN HFA 108 (90 Base) MCG/ACT inhaler Inhale 2 puffs into the lungs every 4 (four) hours as needed for wheezing or shortness of breath.     . nitroGLYCERIN (NITROSTAT) 0.4 MG SL tablet Place 1 tablet (0.4 mg total) under the tongue every 5 (five) minutes as needed. For chest pain (Patient not taking: Reported on 08/21/2016) 25 tablet 5  . ondansetron (ZOFRAN ODT) 4 MG disintegrating tablet Take 1 tablet (4 mg total) by mouth every 8 (eight) hours as needed for nausea or vomiting. (Patient not taking: Reported on 08/21/2016) 20 tablet 0   No current facility-administered medications for this visit.    Facility-Administered Medications Ordered in Other Visits  Medication Dose Route Frequency Provider Last Rate Last Dose  . sodium chloride 0.9 % injection 10 mL  10 mL Intravenous PRN Heath Lark, MD   10 mL at 09/18/15 1345  . sodium chloride 0.9 % injection 10 mL  10 mL Intravenous PRN Heath Lark, MD   10 mL at 04/24/16 1136  . sodium chloride flush (NS) 0.9 % injection 10 mL  10 mL Intracatheter PRN Heath Lark, MD    10 mL at 08/21/16 1507  PHYSICAL EXAMINATION: ECOG PERFORMANCE STATUS: 1 - Symptomatic but completely ambulatory  Vitals:   08/21/16 1034  BP: 117/61  Pulse: 91  Resp: 18  Temp: 98.2 F (36.8 C)   Filed Weights   08/21/16 1034  Weight: 158 lb 12.8 oz (72 kg)    GENERAL:alert, no distress and comfortable SKIN: skin color, texture, turgor are normal, no rashes or significant lesions EYES: normal, Conjunctiva are pink and non-injected, sclera clear OROPHARYNX:no exudate, no erythema and lips, buccal mucosa, and tongue normal  NECK: supple, thyroid normal size, non-tender, without nodularity LYMPH:  no palpable lymphadenopathy in the cervical, axillary or inguinal LUNGS: clear to auscultation and percussion with normal breathing effort HEART: regular rate & rhythm and no murmurs and no lower extremity edema ABDOMEN:abdomen soft, non-tender and normal bowel sounds Musculoskeletal:no cyanosis of digits and no clubbing  NEURO: alert & oriented x 3 with fluent speech, no focal motor/sensory deficits  LABORATORY DATA:  I have reviewed the data as listed    Component Value Date/Time   NA 142 08/21/2016 0956   K 4.0 08/21/2016 0956   CL 109 07/25/2016 0939   CL 102 10/24/2012 1001   CO2 24 08/21/2016 0956   GLUCOSE 99 08/21/2016 0956   GLUCOSE 111 (H) 10/24/2012 1001   BUN 9.5 08/21/2016 0956   CREATININE 1.1 08/21/2016 0956   CALCIUM 8.9 08/21/2016 0956   PROT 6.6 08/21/2016 0956   ALBUMIN 3.0 (L) 08/21/2016 0956   AST 10 08/21/2016 0956   ALT 8 08/21/2016 0956   ALKPHOS 168 (H) 08/21/2016 0956   BILITOT 0.43 08/21/2016 0956   GFRNONAA 58 (L) 07/25/2016 0939   GFRAA >60 07/25/2016 0939    No results found for: SPEP, UPEP  Lab Results  Component Value Date   WBC 13.3 (H) 08/21/2016   NEUTROABS 11.1 (H) 08/21/2016   HGB 9.6 (L) 08/21/2016   HCT 31.4 (L) 08/21/2016   MCV 86.0 08/21/2016   PLT 252 08/21/2016      Chemistry      Component Value Date/Time   NA  142 08/21/2016 0956   K 4.0 08/21/2016 0956   CL 109 07/25/2016 0939   CL 102 10/24/2012 1001   CO2 24 08/21/2016 0956   BUN 9.5 08/21/2016 0956   CREATININE 1.1 08/21/2016 0956      Component Value Date/Time   CALCIUM 8.9 08/21/2016 0956   ALKPHOS 168 (H) 08/21/2016 0956   AST 10 08/21/2016 0956   ALT 8 08/21/2016 0956   BILITOT 0.43 08/21/2016 0956       ASSESSMENT & PLAN:  Cancer of upper lobe of left lung, Adenocarcinoma She tolerated treatment well without major side effects. We will proceed with more treatment. I plan to see her back next month for further supportive care. Due to history recent neutropenia and infection, she will get G-CSF support after day 8 of treatment every cycle  Brain metastases treated with radiosurgery Her most recent MRI in December 2017 showed no evidence of disease recurrence. Plan to continue surveillance imaging study every 3-4 months  Anemia due to chemotherapy This is likely due to recent treatment. The patient denies recent history of bleeding such as epistaxis, hematuria or hematochezia. She is asymptomatic from the anemia. I will observe for now.  She does not require transfusion now. I will continue the chemotherapy at current dose without dosage adjustment.  If the anemia gets progressive worse in the future, I might have to delay her treatment or adjust the chemotherapy  dose.   Leukocytosis This is likely due to recent G-CSF. Monitor only  TOBACCO ABUSE I spent some time counseling the patient the importance of tobacco cessation. She is currently not interested to quit now.   No orders of the defined types were placed in this encounter.  All questions were answered. The patient knows to call the clinic with any problems, questions or concerns. No barriers to learning was detected. I spent 15 minutes counseling the patient face to face. The total time spent in the appointment was 20 minutes and more than 50% was on counseling and  review of test results     Heath Lark, MD 08/21/2016 3:30 PM

## 2016-08-21 NOTE — Patient Instructions (Signed)
[facility] Discharge Instructions for Patients Receiving Chemotherapy  Today you received the following chemotherapy agents: Gemzar and Carboplatin   To help prevent nausea and vomiting after your treatment, we encourage you to take your nausea medication as directed.    If you develop nausea and vomiting that is not controlled by your nausea medication, call the clinic.   BELOW ARE SYMPTOMS THAT SHOULD BE REPORTED IMMEDIATELY:  *FEVER GREATER THAN 100.5 F  *CHILLS WITH OR WITHOUT FEVER  NAUSEA AND VOMITING THAT IS NOT CONTROLLED WITH YOUR NAUSEA MEDICATION  *UNUSUAL SHORTNESS OF BREATH  *UNUSUAL BRUISING OR BLEEDING  TENDERNESS IN MOUTH AND THROAT WITH OR WITHOUT PRESENCE OF ULCERS  *URINARY PROBLEMS  *BOWEL PROBLEMS  UNUSUAL RASH Items with * indicate a potential emergency and should be followed up as soon as possible.  Feel free to call the clinic you have any questions or concerns. The clinic phone number is [phone].  Please show the CHEMO ALERT CARD at check-in to the Emergency Department and triage nurse.   

## 2016-08-21 NOTE — Assessment & Plan Note (Signed)
This is likely due to recent G-CSF. Monitor only

## 2016-08-21 NOTE — Telephone Encounter (Signed)
Appointments scheduled per 4.6.18 LOS. Patient given AVS report and calendars with future scheduled appointments.

## 2016-08-21 NOTE — Assessment & Plan Note (Signed)

## 2016-08-21 NOTE — Assessment & Plan Note (Signed)
I spent some time counseling the patient the importance of tobacco cessation. She is currently not interested to quit now. 

## 2016-08-28 ENCOUNTER — Ambulatory Visit (HOSPITAL_BASED_OUTPATIENT_CLINIC_OR_DEPARTMENT_OTHER): Payer: Medicare HMO

## 2016-08-28 ENCOUNTER — Other Ambulatory Visit (HOSPITAL_BASED_OUTPATIENT_CLINIC_OR_DEPARTMENT_OTHER): Payer: Medicare HMO

## 2016-08-28 VITALS — BP 152/70 | HR 93 | Temp 98.1°F | Resp 18

## 2016-08-28 DIAGNOSIS — C7931 Secondary malignant neoplasm of brain: Secondary | ICD-10-CM | POA: Diagnosis not present

## 2016-08-28 DIAGNOSIS — C3412 Malignant neoplasm of upper lobe, left bronchus or lung: Secondary | ICD-10-CM | POA: Diagnosis not present

## 2016-08-28 DIAGNOSIS — C7949 Secondary malignant neoplasm of other parts of nervous system: Secondary | ICD-10-CM

## 2016-08-28 DIAGNOSIS — Z5189 Encounter for other specified aftercare: Secondary | ICD-10-CM | POA: Diagnosis not present

## 2016-08-28 DIAGNOSIS — Z5111 Encounter for antineoplastic chemotherapy: Secondary | ICD-10-CM

## 2016-08-28 LAB — CBC WITH DIFFERENTIAL/PLATELET
BASO%: 0.3 % (ref 0.0–2.0)
BASOS ABS: 0 10*3/uL (ref 0.0–0.1)
EOS ABS: 0 10*3/uL (ref 0.0–0.5)
EOS%: 0.3 % (ref 0.0–7.0)
HCT: 30.1 % — ABNORMAL LOW (ref 34.8–46.6)
HGB: 9.3 g/dL — ABNORMAL LOW (ref 11.6–15.9)
LYMPH%: 10.7 % — ABNORMAL LOW (ref 14.0–49.7)
MCH: 26.6 pg (ref 25.1–34.0)
MCHC: 30.9 g/dL — AB (ref 31.5–36.0)
MCV: 86.2 fL (ref 79.5–101.0)
MONO#: 0.6 10*3/uL (ref 0.1–0.9)
MONO%: 9.3 % (ref 0.0–14.0)
NEUT%: 79.4 % — ABNORMAL HIGH (ref 38.4–76.8)
NEUTROS ABS: 5 10*3/uL (ref 1.5–6.5)
PLATELETS: 240 10*3/uL (ref 145–400)
RBC: 3.49 10*6/uL — AB (ref 3.70–5.45)
RDW: 21.1 % — ABNORMAL HIGH (ref 11.2–14.5)
WBC: 6.4 10*3/uL (ref 3.9–10.3)
lymph#: 0.7 10*3/uL — ABNORMAL LOW (ref 0.9–3.3)
nRBC: 0 % (ref 0–0)

## 2016-08-28 LAB — COMPREHENSIVE METABOLIC PANEL
ALT: 7 U/L (ref 0–55)
ANION GAP: 8 meq/L (ref 3–11)
AST: 12 U/L (ref 5–34)
Albumin: 3 g/dL — ABNORMAL LOW (ref 3.5–5.0)
Alkaline Phosphatase: 138 U/L (ref 40–150)
BUN: 9.1 mg/dL (ref 7.0–26.0)
CHLORIDE: 108 meq/L (ref 98–109)
CO2: 26 meq/L (ref 22–29)
CREATININE: 1 mg/dL (ref 0.6–1.1)
Calcium: 9.3 mg/dL (ref 8.4–10.4)
EGFR: 66 mL/min/{1.73_m2} — ABNORMAL LOW (ref >90)
Glucose: 95 mg/dl (ref 70–140)
Potassium: 3.9 mEq/L (ref 3.5–5.1)
SODIUM: 141 meq/L (ref 136–145)
Total Bilirubin: 0.4 mg/dL (ref 0.20–1.20)
Total Protein: 6.6 g/dL (ref 6.4–8.3)

## 2016-08-28 MED ORDER — DEXAMETHASONE SODIUM PHOSPHATE 10 MG/ML IJ SOLN
10.0000 mg | Freq: Once | INTRAMUSCULAR | Status: AC
  Administered 2016-08-28: 10 mg via INTRAVENOUS

## 2016-08-28 MED ORDER — PEGFILGRASTIM 6 MG/0.6ML ~~LOC~~ PSKT
6.0000 mg | PREFILLED_SYRINGE | Freq: Once | SUBCUTANEOUS | Status: AC
  Administered 2016-08-28: 6 mg via SUBCUTANEOUS
  Filled 2016-08-28: qty 0.6

## 2016-08-28 MED ORDER — PALONOSETRON HCL INJECTION 0.25 MG/5ML
0.2500 mg | Freq: Once | INTRAVENOUS | Status: AC
  Administered 2016-08-28: 0.25 mg via INTRAVENOUS

## 2016-08-28 MED ORDER — CARBOPLATIN CHEMO INTRADERMAL TEST DOSE 100MCG/0.02ML
100.0000 ug | Freq: Once | INTRADERMAL | Status: AC
  Administered 2016-08-28: 100 ug via INTRADERMAL
  Filled 2016-08-28: qty 0.02

## 2016-08-28 MED ORDER — DEXAMETHASONE SODIUM PHOSPHATE 10 MG/ML IJ SOLN
INTRAMUSCULAR | Status: AC
  Filled 2016-08-28: qty 1

## 2016-08-28 MED ORDER — SODIUM CHLORIDE 0.9 % IV SOLN
Freq: Once | INTRAVENOUS | Status: AC
  Administered 2016-08-28: 11:00:00 via INTRAVENOUS

## 2016-08-28 MED ORDER — GEMCITABINE HCL CHEMO INJECTION 1 GM/26.3ML
500.0000 mg/m2 | Freq: Once | INTRAVENOUS | Status: AC
  Administered 2016-08-28: 912 mg via INTRAVENOUS
  Filled 2016-08-28: qty 23.99

## 2016-08-28 MED ORDER — SODIUM CHLORIDE 0.9% FLUSH
10.0000 mL | INTRAVENOUS | Status: DC | PRN
  Administered 2016-08-28: 10 mL
  Filled 2016-08-28: qty 10

## 2016-08-28 MED ORDER — PALONOSETRON HCL INJECTION 0.25 MG/5ML
INTRAVENOUS | Status: AC
  Filled 2016-08-28: qty 5

## 2016-08-28 MED ORDER — HEPARIN SOD (PORK) LOCK FLUSH 100 UNIT/ML IV SOLN
500.0000 [IU] | Freq: Once | INTRAVENOUS | Status: AC | PRN
  Administered 2016-08-28: 500 [IU]
  Filled 2016-08-28: qty 5

## 2016-08-28 MED ORDER — SODIUM CHLORIDE 0.9 % IJ SOLN
10.0000 mL | INTRAMUSCULAR | Status: DC | PRN
  Administered 2016-08-28: 10 mL via INTRAVENOUS
  Filled 2016-08-28: qty 10

## 2016-08-28 MED ORDER — SODIUM CHLORIDE 0.9 % IV SOLN
134.5600 mg | Freq: Once | INTRAVENOUS | Status: AC
  Administered 2016-08-28: 130 mg via INTRAVENOUS
  Filled 2016-08-28: qty 13

## 2016-08-28 NOTE — Progress Notes (Signed)
[facility] Counseling Note  Visited patient in Infusion for second session. After briefly catching up, pt requested that we meet 4/27 at 12:30p for second full session instead.   Westly Pam, [place] [facility] [place]

## 2016-08-28 NOTE — Patient Instructions (Signed)
Lauren [name] Discharge Instructions for Patients Receiving Chemotherapy  Today you received the following chemotherapy agents Gemzar and Carboplatin   To help prevent nausea and vomiting after your treatment, we encourage you to take your nausea medication as directed. No Zofran for 3 days. Take Compazine instead.    If you develop nausea and vomiting that is not controlled by your nausea medication, call the clinic.   BELOW ARE SYMPTOMS THAT SHOULD BE REPORTED IMMEDIATELY:  *FEVER GREATER THAN 100.5 F  *CHILLS WITH OR WITHOUT FEVER  NAUSEA AND VOMITING THAT IS NOT CONTROLLED WITH YOUR NAUSEA MEDICATION  *UNUSUAL SHORTNESS OF BREATH  *UNUSUAL BRUISING OR BLEEDING  TENDERNESS IN MOUTH AND THROAT WITH OR WITHOUT PRESENCE OF ULCERS  *URINARY PROBLEMS  *BOWEL PROBLEMS  UNUSUAL RASH Items with * indicate a potential emergency and should be followed up as soon as possible.  Feel free to call the clinic you have any questions or concerns. The clinic phone number is [phone].  Please show the [place] at check-in to the Emergency Department and triage nurse.

## 2016-08-29 ENCOUNTER — Emergency Department (HOSPITAL_COMMUNITY)
Admission: EM | Admit: 2016-08-29 | Discharge: 2016-08-29 | Disposition: A | Discharge status: Home / Self Care | Payer: Medicare HMO | Attending: Emergency Medicine | Admitting: Emergency Medicine

## 2016-08-29 ENCOUNTER — Emergency Department (HOSPITAL_COMMUNITY): Payer: Medicare HMO

## 2016-08-29 ENCOUNTER — Encounter (HOSPITAL_COMMUNITY): Payer: Self-pay | Admitting: Emergency Medicine

## 2016-08-29 DIAGNOSIS — Z79899 Other long term (current) drug therapy: Secondary | ICD-10-CM | POA: Diagnosis not present

## 2016-08-29 DIAGNOSIS — F17210 Nicotine dependence, cigarettes, uncomplicated: Secondary | ICD-10-CM | POA: Insufficient documentation

## 2016-08-29 DIAGNOSIS — I129 Hypertensive chronic kidney disease with stage 1 through stage 4 chronic kidney disease, or unspecified chronic kidney disease: Secondary | ICD-10-CM | POA: Insufficient documentation

## 2016-08-29 DIAGNOSIS — J441 Chronic obstructive pulmonary disease with (acute) exacerbation: Secondary | ICD-10-CM | POA: Diagnosis not present

## 2016-08-29 DIAGNOSIS — Z85038 Personal history of other malignant neoplasm of large intestine: Secondary | ICD-10-CM | POA: Insufficient documentation

## 2016-08-29 DIAGNOSIS — Z7982 Long term (current) use of aspirin: Secondary | ICD-10-CM | POA: Diagnosis not present

## 2016-08-29 DIAGNOSIS — N183 Chronic kidney disease, stage 3 (moderate): Secondary | ICD-10-CM | POA: Diagnosis not present

## 2016-08-29 DIAGNOSIS — R079 Chest pain, unspecified: Secondary | ICD-10-CM | POA: Diagnosis not present

## 2016-08-29 DIAGNOSIS — Z85118 Personal history of other malignant neoplasm of bronchus and lung: Secondary | ICD-10-CM | POA: Diagnosis not present

## 2016-08-29 DIAGNOSIS — R0602 Shortness of breath: Secondary | ICD-10-CM | POA: Diagnosis not present

## 2016-08-29 DIAGNOSIS — R05 Cough: Secondary | ICD-10-CM | POA: Diagnosis not present

## 2016-08-29 DIAGNOSIS — I2510 Atherosclerotic heart disease of native coronary artery without angina pectoris: Secondary | ICD-10-CM | POA: Diagnosis not present

## 2016-08-29 MED ORDER — IPRATROPIUM-ALBUTEROL 0.5-2.5 (3) MG/3ML IN SOLN
3.0000 mL | Freq: Once | RESPIRATORY_TRACT | Status: AC
  Administered 2016-08-29: 3 mL via RESPIRATORY_TRACT
  Filled 2016-08-29: qty 3

## 2016-08-29 MED ORDER — ALBUTEROL SULFATE (2.5 MG/3ML) 0.083% IN NEBU
5.0000 mg | INHALATION_SOLUTION | Freq: Once | RESPIRATORY_TRACT | Status: DC
  Filled 2016-08-29: qty 6

## 2016-08-29 MED ORDER — GUAIFENESIN-CODEINE 100-10 MG/5ML PO SOLN
5.0000 mL | Freq: Once | ORAL | Status: AC
  Administered 2016-08-29: 5 mL via ORAL
  Filled 2016-08-29: qty 5

## 2016-08-29 MED ORDER — DEXAMETHASONE 4 MG PO TABS
8.0000 mg | ORAL_TABLET | Freq: Once | ORAL | Status: AC
  Administered 2016-08-29: 8 mg via ORAL
  Filled 2016-08-29: qty 2

## 2016-08-29 NOTE — ED Triage Notes (Signed)
Patient states that she started having trouble breathing. Patient does not know what caused it. Patient states she has lung cancer and has not have problem breathing. Patient states that she stills smoke. Patient is not complaining of pain but feels like pressure sitting on her chest.

## 2016-08-29 NOTE — ED Triage Notes (Signed)
Pt is presented by EMS who report initial call was for shortness of breath. They administered 5 mg of albuterol after which pt reported being able to breath slightly with ease. Pt is a lung cancer pt with mets, last treatment yesterday, right now she states she feels as if "elephant is sitting on her chest although she is not in pain."

## 2016-08-29 NOTE — ED Notes (Signed)
Bed: WA20 Expected date: 08/29/16 Expected time: 7:14 PM Means of arrival: Ambulance Comments: Resp diff, ca pt

## 2016-08-29 NOTE — ED Provider Notes (Signed)
[facility] DEPT Provider Note    By signing my name below, I, Bea Graff, attest that this documentation has been prepared under the direction and in the presence of Virgel Manifold, MD. Electronically Signed: Bea Graff, ED Scribe. 08/29/16. 9:37 PM.    History   Chief Complaint Chief Complaint  Patient presents with  . Shortness of Breath    The history is provided by the patient and medical records. No language interpreter was used.    Lauren [name] is a 66 y.o. female with PMHx of lung cancer, COPD, HLD, HTN brought in by EMS, who presents to the Emergency Department complaining of SOB that began earlier this afternoon. She reports associated pressure stating it feels like an elephant was sitting on her chest. She also reports cough and diaphoresis. Pt states she laid down for a nap earlier this afternoon and was fine but states upon waking the SOB started. She states she is back at baseline now. She has not done anything to treat her symptoms. There are no modifying factors noted. She denies pain, fever, chills, leg swelling or calf pain. She is a smoker and is not on home oxygen. She is currently on chemotherapy for lung cancer. She denies h/o DVT.   Past Medical History:  Diagnosis Date  . Abnormality of gait 09/10/2015  . Acute on chronic respiratory failure with hypoxemia ([place]) 07/11/2015  . Anemia due to chemotherapy 08/29/2014  . 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 Angina pectoris ([place]) 03/05/2016  . Arthritis   . ASCUS with positive high risk HPV 04/21/2012   colpo clinically normal 04/2012.  Given her other significant co morbidities (recurrent lung cancer, brain mets and colon cancer requiring chronic chemo) I don't think I would be very aggressive in pursuing any more paps. If she felt necessary, could do a repeat in one year, but the likelihood is that this is the least of her problems and I doubt she will benefit from repeated paps and colpos and  I sincerely doubt anything we find would change the course of her life. We discussed. She has already decided to consider stopping chemo in May because after 5 years she is quite tired of it. I would always be willing to do a pap, but my recommendation is to stop.   . Back pain   . Benign paroxysmal positional vertigo 08/16/2015  . Bilateral hearing loss, Mild 02/27/2016   03/23/16 Hearing loss Evaluation by Unice Bailey, MD, [facility] Ear, Nose and Throat, Dx with mild bilateral hearing loss except normal hearing in right ear of 500 Hz and 1000 Hz.  Dr Redmond Baseman recommends annual hearing evaluations.   . Bilateral leg edema 10/17/2014  . Bilateral pleural effusion 08/09/2013  . Blurred vision, bilateral 06/06/2014  . Brain metastases ([place]) 02/15/12  . Cerebral aneurysm   . CEREBRAL ANEURYSM 12/29/2006   Qualifier: Diagnosis of  By: Girard Cooter MD, MAKEECHA    . Chronic dermatitis of hands 02/27/2016  . Chronic folliculitis    of groin  . Colon cancer ([place]) 11/08  . COPD (chronic obstructive pulmonary disease) ([place])   . COPD mixed type ([place]) 07/15/2015  . Coronary artery disease   . Coronary atherosclerosis 09/09/2007   Qualifier: Diagnosis of  By: [place], Bonnie    . Depression   . Depressive disorder   . DEPRESSIVE DISORDER, MAJOR, RCR, MILD 12/29/2006   Qualifier: Diagnosis of  By: Girard Cooter MD, MAKEECHA    . Dyspnea 07/15/2015  . Encounter for  antineoplastic chemotherapy 09/11/2015  . Encounter for therapeutic drug monitoring 09/11/2015  . Essential hypertension 12/29/2006   Echocardiogram (07/26/13): Left ventricle: The cavity size was normal. Wall thickness was increased in a pattern of mild LVH. Systolic function was normal. The estimated ejection fraction was in the range of 55% to 60%. Wall motion was normal; there were no regional wall motion abnormalities. Normal mitral valve.    . Falls frequently 08/02/2015  . Family history of Huntington's disease   . Family history of trichomonal  vaginitis 05/2005  . Fatigue 02/06/2013  . 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 Fibrocystic breast changes   . GERD 12/29/2006   Qualifier: Diagnosis of  By: Girard Cooter MD, MAKEECHA    . GERD (gastroesophageal reflux disease)   . History of colon cancer 05/31/2013  . Hypercholesterolemia   . HYPERCHOLESTEROLEMIA 12/29/2006   Qualifier: Diagnosis of  By: Girard Cooter MD, MAKEECHA    . Hyperlipidemia   . Hypertension   . Hypokalemia 08/09/2013  . Insomnia 10/05/2011  . Left-sided low back pain with left-sided sciatica 07/18/2014  . Low back pain with sciatica 06/22/2014  . Lung cancer ([place]) 06/16/10   PET scan 04/28/2010; primary: increase in size 02/2010 / Well Differentiated Adenocarcinoma of the lung   . Lung nodule    FNA ordered for 04/02/10 by HA>pos Ca  . LVH (left ventricular hypertrophy) 12/20/2014   Echocardiogram (07/26/13): Left ventricle: Wall thickness was increased in a pattern of mild LVH. The cavity size was normal. Systolic function was normal. The estimated ejection fraction was in the range of 55% to 60%. Wall motion was normal; there were no regional wall motion abnormalities. Normal mitral valve.   . On antineoplastic chemotherapy started 02/2012   [place]  . Other fatigue 11/28/2014  . Pleural effusion 05/03/2013  . 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 Postmenopausal   . POSTMENOPAUSAL SYNDROME 01/31/2009   Qualifier: Diagnosis of  By: Carlena Sax  MD, [place]    . Postural dizziness 06/19/2015  . Protein-calorie malnutrition, moderate ([place]) 09/11/2015  . S/P radiation therapy 03/01/12   SRS: 1 fraction / 20 Gray each to the Left Occipital Region and to the Right Insular Metastases  . Sensorineural hearing loss (SNHL) of both ears 04/23/2016   Dr Keturah Barre. Redmond Baseman (ENT) dx significant bilateral hearing loss and recommended biaural hearing aids.   . Shortness of breath   . Skin rash 10/03/2015  . Status post chemotherapy comp. 12/29/11   Carboplatin/Taxol    . Status post radiation therapy 11/16/11 - 12/29/11   Right Lung and Mediastinum: 60 Gy  . Status post stereotactic radiosurgery 01/2012   for Brain Metastases  . Tobacco dependence   . Vitamin D deficiency 08/30/2015  . Weight loss 10/22/2011    Patient Active Problem List   Diagnosis Date Noted  . Leukocytosis 08/21/2016  . Stress response 08/13/2016  . Acute bronchitis 06/25/2016  . Cancer associated pain 05/22/2016  . Pancytopenia, acquired ([place]) 05/01/2016  . Chronic kidney disease, stage III (moderate) 05/01/2016  . Sensorineural hearing loss (SNHL) of both ears 04/23/2016  . Osteoarthritis of right knee 04/23/2016  . Balance problem 04/23/2016  . Impingement syndrome of left shoulder 04/23/2016  . Goals of care, counseling/discussion 04/16/2016  . Chronic dermatitis of hands 02/27/2016  . Abnormal mammogram of right breast 11/01/2015  . Encounter for chemotherapy management 09/11/2015  . Encounter for therapeutic drug monitoring 09/11/2015  . Encounter for antineoplastic chemotherapy 09/11/2015  . Vitamin  D deficiency 08/30/2015  . Mild cognitive impairment 08/16/2015  . Falls frequently 08/02/2015  . COPD mixed type ([place]) 07/15/2015  . Protein calorie malnutrition ([place]) 03/14/2015  . LVH (left ventricular hypertrophy) 12/20/2014  . Blepharitis of both eyes 11/09/2014  . Anemia due to chemotherapy 08/29/2014  . Neuropathy due to chemotherapeutic drug ([place]) 07/19/2014  . History of colon cancer 05/31/2013  . Brain metastases treated with radiosurgery 02/26/2012  . Cancer of upper lobe of left lung, Adenocarcinoma   . TOBACCO ABUSE 10/04/2007  . Coronary atherosclerosis 09/09/2007  . HYPERCHOLESTEROLEMIA 12/29/2006  . DEPRESSIVE DISORDER, MAJOR, RCR, MILD 12/29/2006  . Essential hypertension 12/29/2006  . CEREBRAL ANEURYSM 12/29/2006  . GERD 12/29/2006    Past Surgical History:  Procedure Laterality Date  . BACK SURGERY     Dr Luiz Ochoa  . BREAST SURGERY     Bil  lumpectomy  . cardiac cath x3    . CHEST TUBE INSERTION Right 06/12/2013   Procedure: INSERTION PLEURAL DRAINAGE CATHETER;  Surgeon: Gaye Pollack, MD;  Location: [facility];  Service: Thoracic;  Laterality: Right;  . COLECTOMY  03/22/07   Stage 1 pT2 N0, M0 Adenocarcinoma of the sigmoid  colon  . HERNIA REPAIR    . LUNG LOBECTOMY  06/16/10   Left Upper Lobectomy  . MEDIASTINOSCOPY  10/19/2011   Procedure: MEDIASTINOSCOPY;  Surgeon: Gaye Pollack, MD;  Location: [facility];  Service: Thoracic;  Laterality: N/A;  . TALC PLEURODESIS Right 06/12/2013   Procedure: Pietro Cassis;  Surgeon: Gaye Pollack, MD;  Location: [facility];  Service: Thoracic;  Laterality: Right;  . TUBAL LIGATION    . TUNNELED VENOUS CATHETER PLACEMENT     Port-a-Cath    OB History    No data available       Home Medications    Prior to Admission medications   Medication Sig Start Date End Date Taking? Authorizing Provider  aspirin 81 MG EC tablet Take 81 mg by mouth daily.  07/06/16  Yes Historical Provider, MD  BYSTOLIC 10 MG tablet Take 10 mg by mouth daily.  06/16/16  Yes Historical Provider, MD  cholecalciferol (VITAMIN D) 1000 units tablet Take 1,000 Units by mouth daily.  07/06/16  Yes Historical Provider, MD  COLACE 100 MG capsule Take 100 mg by mouth daily as needed for mild constipation.  10/25/15  Yes Historical Provider, MD  HYDROmorphone (DILAUDID) 8 MG tablet Take 1 tablet (8 mg total) by mouth every 6 (six) hours as needed for severe pain. 08/21/16  Yes Heath Lark, MD  ipratropium (ATROVENT HFA) 17 MCG/ACT inhaler Inhale 2 puffs into the lungs every 4 (four) hours as needed for wheezing (Cough). 06/25/16  Yes [name], MD  loratadine (CLARITIN) 10 MG tablet Take 10 mg by mouth daily.   Yes Historical Provider, MD  nitroGLYCERIN (NITROSTAT) 0.4 MG SL tablet Place 1 tablet (0.4 mg total) under the tongue every 5 (five) minutes as needed. For chest pain 12/20/14  Yes [name], MD  omeprazole (PRILOSEC) 40 MG  capsule Take 40 mg by mouth daily.  06/16/16  Yes Historical Provider, MD  pravastatin (PRAVACHOL) 40 MG tablet Take 40 mg by mouth daily.  06/16/16  Yes Historical Provider, MD  umeclidinium-vilanterol (ANORO ELLIPTA) 62.5-25 MCG/INH AEPB Inhale 1 puff into the lungs daily. 06/01/16  Yes Noralee Space, MD  VENTOLIN HFA 108 (90 Base) MCG/ACT inhaler Inhale 2 puffs into the lungs every 4 (four) hours as needed for wheezing or  shortness of breath.  06/01/16  Yes Historical Provider, MD  lidocaine-prilocaine (EMLA) cream Apply topically as needed. Apply to porta cath site one hour prior to needle stick. 08/29/14   Heath Lark, MD  ondansetron (ZOFRAN ODT) 4 MG disintegrating tablet Take 1 tablet (4 mg total) by mouth every 8 (eight) hours as needed for nausea or vomiting. Patient not taking: Reported on 08/21/2016 04/23/16   [name], MD  predniSONE (DELTASONE) 10 MG tablet Take 1 tablet (10 mg total) by mouth daily with breakfast. Patient not taking: Reported on 08/29/2016 07/17/16   Heath Lark, MD    Family History Family History  Problem Relation Age of Onset  . Stomach cancer Maternal Aunt   . Osteoarthritis Father   . Gout Father   . Hypertension Father   . Heart disease Mother     pericarditis;   . Breast cancer Cousin   . Cancer Sister     Lymphatic  . Huntington's disease Son   . Huntington's disease Son   . Anesthesia problems Neg Hx     Social History Social History  Substance Use Topics  . Smoking status: Current Every Day Smoker    Packs/day: 0.75    Types: Cigarettes    Start date: 05/18/1965  . Smokeless tobacco: Never Used  . Alcohol use 0.0 oz/week     Comment: occasional     Allergies   Adhesive [tape] and Lisinopril   Review of Systems Review of Systems All other systems reviewed and are negative for acute change except as noted in the HPI.   Physical Exam Updated Vital Signs BP (!) 105/56 (BP Location: Left Arm)   Pulse 96   Temp 97.9 F (36.6 C)  (Oral)   Resp (!) 23   Ht '5\' 4"'$  (1.626 m)   Wt 158 lb (71.7 kg)   SpO2 100%   BMI 27.12 kg/m   Physical Exam  Constitutional: She is oriented to person, place, and time. She appears well-developed and well-nourished. No distress.  HENT:  Head: Normocephalic and atraumatic.  Eyes: EOM are normal.  Neck: Normal range of motion.  Cardiovascular: Normal rate, regular rhythm and normal heart sounds.   Pulmonary/Chest: Effort normal. She has wheezes (faint expiratory).  Abdominal: Soft. She exhibits no distension. There is no tenderness.  Musculoskeletal: Normal range of motion.  Neurological: She is alert and oriented to person, place, and time.  Skin: Skin is warm and dry.  Psychiatric: She has a normal mood and affect. Judgment normal.  Nursing note and vitals reviewed.    ED Treatments / Results  DIAGNOSTIC STUDIES: Oxygen Saturation is 100% on RA, normal by my interpretation.   COORDINATION OF CARE: 8:08 PM- Will order nebulizer treatment and cough medication. Pt verbalizes understanding and agrees to plan.  Medications  albuterol (PROVENTIL) (2.5 MG/3ML) 0.083% nebulizer solution 5 mg (5 mg Nebulization Not Given 08/29/16 1953)  ipratropium-albuterol (DUONEB) 0.5-2.5 (3) MG/3ML nebulizer solution 3 mL (3 mLs Nebulization Given 08/29/16 2117)  guaiFENesin-codeine 100-10 MG/5ML solution 5 mL (5 mLs Oral Given 08/29/16 2117)    Labs (all labs ordered are listed, but only abnormal results are displayed) Labs Reviewed - No data to display  EKG  EKG Interpretation None       Radiology Dg Chest 2 View  Result Date: 08/29/2016 CLINICAL DATA:  Short of breath and chest pain today. Chronic cough. Lung cancer. Hypertension. COPD. EXAM: CHEST  2 VIEW COMPARISON:  06/29/2016 CT.  Plain film 06/01/2016. FINDINGS:  A right-sided Port-A-Cath terminates at the superior caval/ atrial junction. Midline trachea. Normal heart size. Right perihilar and paratracheal soft tissue fullness is  similar and likely corresponds to radiation change on the prior CT. A small right pleural effusion persists. No pneumothorax. Mild bibasilar atelectasis is not significantly changed. No new areas of consolidation. Cough IMPRESSION: Similar appearance of the chest compared to 06/01/2016. Small right pleural effusion with bibasilar airspace disease/ atelectasis. Electronically Signed   By: Abigail Miyamoto M.D.   On: 08/29/2016 20:24    Procedures Procedures (including critical care time)  Medications Ordered in ED Medications  albuterol (PROVENTIL) (2.5 MG/3ML) 0.083% nebulizer solution 5 mg (5 mg Nebulization Not Given 08/29/16 1953)  ipratropium-albuterol (DUONEB) 0.5-2.5 (3) MG/3ML nebulizer solution 3 mL (3 mLs Nebulization Given 08/29/16 2117)  guaiFENesin-codeine 100-10 MG/5ML solution 5 mL (5 mLs Oral Given 08/29/16 2117)     Initial Impression / Assessment and Plan / ED Course  I have reviewed the triage vital signs and the nursing notes.  Pertinent labs & imaging results that were available during my care of the patient were reviewed by me and considered in my medical decision making (see chart for details).    Final Clinical Impressions(s) / ED Diagnoses   Final diagnoses:  COPD exacerbation ([place])    New Prescriptions New Prescriptions   No medications on file    I personally preformed the services scribed in my presence. The recorded information has been reviewed is accurate. Virgel Manifold, MD.      Virgel Manifold, MD 09/10/16 [phone]

## 2016-08-31 ENCOUNTER — Other Ambulatory Visit: Payer: Self-pay | Admitting: *Deleted

## 2016-08-31 ENCOUNTER — Encounter: Payer: Self-pay | Admitting: *Deleted

## 2016-08-31 NOTE — Patient Outreach (Signed)
[facility]) Care Management  08/31/2016   Lauren [name] June 04, 1950 [id number]  RN Health Coach telephone call to patient.  [place] compliance verified. Per patient she had to go to the ER due to unable to breathe. Per patient patient she went and sat on the back porch but the breathing didn't get better so she had her neighbor call an ambulance. In discussing with patient she had not been using her inhalers. So she waited until she felt she could not breathe and had someone to call 911. RN discussed with patient the importance of using her inhalers as the doctor ordered and not waiting until the breathing gets so bad she feels unable to breathe because this makes you panic.  Patient is still currently smoking. Patient stated the nebulizer treatments helped a lot and she stated she was given some steroids. When speaking with the patient this am she had not used her daily inhaler. Patient went and got it  and used it while we were on the phone.  RN explained the importance of using it daily. Patient has agreed to follow up outreach calls.    Current Medications:  Current Outpatient Prescriptions  Medication Sig Dispense Refill  . aspirin 81 MG EC tablet Take 81 mg by mouth daily.     Marland Kitchen BYSTOLIC 10 MG tablet Take 10 mg by mouth daily.     . cholecalciferol (VITAMIN D) 1000 units tablet Take 1,000 Units by mouth daily.     Marland Kitchen COLACE 100 MG capsule Take 100 mg by mouth daily as needed for mild constipation.     Marland Kitchen HYDROmorphone (DILAUDID) 8 MG tablet Take 1 tablet (8 mg total) by mouth every 6 (six) hours as needed for severe pain. 60 tablet 0  . ipratropium (ATROVENT HFA) 17 MCG/ACT inhaler Inhale 2 puffs into the lungs every 4 (four) hours as needed for wheezing (Cough). 1 Inhaler 12  . lidocaine-prilocaine (EMLA) cream Apply topically as needed. Apply to porta cath site one hour prior to needle stick. 30 g 6  . loratadine (CLARITIN) 10 MG tablet Take 10 mg by mouth daily.     . nitroGLYCERIN (NITROSTAT) 0.4 MG SL tablet Place 1 tablet (0.4 mg total) under the tongue every 5 (five) minutes as needed. For chest pain 25 tablet 5  . omeprazole (PRILOSEC) 40 MG capsule Take 40 mg by mouth daily.     . ondansetron (ZOFRAN ODT) 4 MG disintegrating tablet Take 1 tablet (4 mg total) by mouth every 8 (eight) hours as needed for nausea or vomiting. (Patient not taking: Reported on 08/21/2016) 20 tablet 0  . pravastatin (PRAVACHOL) 40 MG tablet Take 40 mg by mouth daily.     . predniSONE (DELTASONE) 10 MG tablet Take 1 tablet (10 mg total) by mouth daily with breakfast. (Patient not taking: Reported on 08/29/2016) 30 tablet 1  . umeclidinium-vilanterol (ANORO ELLIPTA) 62.5-25 MCG/INH AEPB Inhale 1 puff into the lungs daily. 3 each 3  . VENTOLIN HFA 108 (90 Base) MCG/ACT inhaler Inhale 2 puffs into the lungs every 4 (four) hours as needed for wheezing or shortness of breath.      No current facility-administered medications for this visit.    Facility-Administered Medications Ordered in Other Visits  Medication Dose Route Frequency Provider Last Rate Last Dose  . sodium chloride 0.9 % injection 10 mL  10 mL Intravenous PRN Heath Lark, MD   10 mL at 09/18/15 1345  . sodium chloride 0.9 % injection 10  mL  10 mL Intravenous PRN Heath Lark, MD   10 mL at 04/24/16 1136    Functional Status:  In your present state of health, do you have any difficulty performing the following activities: 08/31/2016 07/31/2016  Hearing? [name]  Vision? Y Y  Difficulty concentrating or making decisions? [name]  Walking or climbing stairs? Y Y  Dressing or bathing? N N  Doing errands, shopping? [name]  Preparing Food and eating ? N N  Using the Toilet? N N  In the past six months, have you accidently leaked urine? Y Y  Do you have problems with loss of bowel control? N N  Managing your Medications? N N  Managing your Finances? N N  Housekeeping or managing your Housekeeping? [name]  Some recent data might be  hidden    Fall/Depression Screening: PHQ 2/9 Scores 08/31/2016 08/13/2016 07/31/2016 07/03/2016 06/25/2016 06/02/2016 05/04/2016  PHQ - 2 Score 1 0 0 0 1 0 0  PHQ- 9 Score - - - - - - -  Exception Documentation - - - - - - -   THN CM Care Plan Problem One     Most Recent Value  Care Plan Problem One  knowledge deficit in self management of COPD  Role Documenting the Problem One  [place] for Problem One  Active  THN Long Term Goal (31-90 days)  Patient will not have any readmissions for COPD within the next 90 days  THN Long Term Goal Start Date  08/31/16  Interventions for Problem One Long Term Goal  RN reminded patient to keep appointments with PCP and oncology and pulmonary specialist. RN reminded patient to take medications as per order.  THN CM Short Term Goal #1 (0-30 days)  Patient will report better nutritional intake within the next 30 days  Interventions for Short Term Goal #1  RN discussed with patient about eating small meals and snacks. RN discussed with patient about drinking more nutritional supplements. RN will send ensure coupons to offsetthe cost  Interventions for Short Term Goal #2  RN discussed with patient the importance of taking her medications as per physician ordered. RN discussed taking the prn inhalers when she feels the symptoms start rather than waiting till she feels she can't breathe and panics. RN will follow up with additional education and teach back.      Assessment:  Patient has not been using her rescue inhalers Patient is still currently smoking Patient had ED visit on [phone]  Plan:  RN discussed the importance of using her rescue inhalers as needed RN discussed the importance of using her control inhaler RN sent educational material on COPD medications, When to get help and using her inhalers the right way RN discussed medication adherence RN will follow up within the month of May  Skyann Ganim [place]  Management [phone] .

## 2016-09-04 MED FILL — GABAPENTIN 300 MG CAPSULE: 300 | 30 days supply | Qty: 90 | Fill #1

## 2016-09-09 ENCOUNTER — Telehealth: Payer: Self-pay | Admitting: Pulmonary Disease

## 2016-09-09 ENCOUNTER — Ambulatory Visit (INDEPENDENT_AMBULATORY_CARE_PROVIDER_SITE_OTHER): Payer: Medicare HMO | Admitting: Pulmonary Disease

## 2016-09-09 ENCOUNTER — Encounter: Payer: Self-pay | Admitting: Pulmonary Disease

## 2016-09-09 VITALS — BP 110/74 | HR 89 | Temp 97.1°F | Ht 64.0 in | Wt 162.4 lb

## 2016-09-09 DIAGNOSIS — F17200 Nicotine dependence, unspecified, uncomplicated: Secondary | ICD-10-CM

## 2016-09-09 DIAGNOSIS — C7931 Secondary malignant neoplasm of brain: Secondary | ICD-10-CM

## 2016-09-09 DIAGNOSIS — J449 Chronic obstructive pulmonary disease, unspecified: Secondary | ICD-10-CM

## 2016-09-09 DIAGNOSIS — J209 Acute bronchitis, unspecified: Secondary | ICD-10-CM | POA: Diagnosis not present

## 2016-09-09 DIAGNOSIS — C7949 Secondary malignant neoplasm of other parts of nervous system: Secondary | ICD-10-CM

## 2016-09-09 DIAGNOSIS — C3412 Malignant neoplasm of upper lobe, left bronchus or lung: Secondary | ICD-10-CM

## 2016-09-09 MED ORDER — IPRATROPIUM BROMIDE HFA 17 MCG/ACT IN AERS: 2.0000 | INHALATION_SPRAY | RESPIRATORY_TRACT | 3 refills | Status: DC | PRN

## 2016-09-09 MED ORDER — UMECLIDINIUM-VILANTEROL 62.5-25 MCG/INH IN AEPB
1.0000 | INHALATION_SPRAY | Freq: Every day | RESPIRATORY_TRACT | 3 refills | Status: AC
Start: 2016-09-09 — End: ?

## 2016-09-09 MED ORDER — VENTOLIN HFA 108 (90 BASE) MCG/ACT IN AERS: 2.0000 | INHALATION_SPRAY | RESPIRATORY_TRACT | 3 refills | Status: AC | PRN

## 2016-09-09 MED ORDER — IPRATROPIUM BROMIDE HFA 17 MCG/ACT IN AERS
2.0000 | INHALATION_SPRAY | RESPIRATORY_TRACT | 3 refills | Status: AC | PRN
Start: 2016-09-09 — End: ?

## 2016-09-09 MED FILL — VENTOLIN HFA 90 MCG INHALER: 108 (90 BAS | 50 days supply | Qty: 54 | Fill #0

## 2016-09-09 MED FILL — ANORO ELLIPTA 62.5-25 MCG I: 62.5-25 | 60 days supply | Qty: 60 | Fill #0

## 2016-09-09 NOTE — Telephone Encounter (Signed)
Spoke with Brayton Layman at [facility], who states atrovent will be on backorder until June. I have spoken with pt, to see if pt uses another pharmacy that I could contact to see if that have atrovent in stock. Pt states she uses [facility] delivery as well. I have spoken with Lysbeth Galas at [facility], who states they do have atrovent in stock. Rx has been sent to [facility]. Pt [place] aware and voiced her understanding Nothing further needed.   Will send to [facility] as a FYI.

## 2016-09-09 NOTE — Patient Instructions (Signed)
Today we updated your med list in our EPIC system...    Continue your current medications the same...  Continue to work on smoking cessation-- cut back, you can do it!!!  We discussed using the VENTOLIN & ATROVENT -- 2 sprays of each TWICE DAILY    1st do 2 sprays of each on arising in the AM...    Wait about 30-60 min & do your one inhalation of the ANORO    Then do your 2nd course of the Ventolin & Atrovent in the evening about 1-2H before bedtime...  Call for any questions...  Let's plan a follow up visit in 3-4, sooner if needed for problems.Marland KitchenMarland Kitchen

## 2016-09-09 NOTE — Progress Notes (Signed)
Subjective:     Patient ID: Lauren [name], female   DOB: 06/21/1950, 66 y.o.   MRN: [id number]  HPI  Cancer of upper lobe of left lung, Adenocarcinoma   03/01/2007 Procedure    Colonoscopy revealed abnormalities and biopsy show high-grade dysplasia      04/01/2007 Surgery    She underwent sigmoid resection which showed T2 N0 colon cancer, and negative margins and all of 17 lymph nodes were negative      02/29/2008 Procedure    Repeat surveillance colonoscopy was negative.      06/16/2010 Surgery    She underwent left upper lobectomy we show well-differentiated adenocarcinoma of the lung, T1, N0, M0      03/18/2011 Procedure    Repeat colonoscopy show multiple polyps but there were benign      10/19/2011 Procedure    Biopsy of mediastinal lymph node came back positive for recurrence of lung cancer, EGFR and ALK negative      11/16/2011 - 12/14/2011 Chemotherapy    She received concurrent chemoradiation therapy with weekly carboplatin and Taxol.      11/16/2011 - 12/29/2011 Radiation Therapy    She received radiation therapy with weekly chemotherapy      02/15/2012 Imaging    MR of the brain showed a new intracranial metastases. This was subsequently treated with stereotactic radiosurgery.      03/07/2012 - 04/12/2013 Chemotherapy    She received chemotherapy with maintainence Alimta every 3 weeks. Chemotherapy was discontinued due to profound fatigue      03/02/2013 Procedure    She had therapeutic ultrasound guidance thoracentesis for pleural effusion that came back negative for cancer      05/04/2013 Procedure    She had repeat ultrasound-guided thoracentesis again and cytology was negative      05/29/2013 Imaging    Repeat CT scan of the chest, abdomen and pelvis show no evidence of disease but persistent right-sided pleural effusion      06/02/2013 Surgery    The patient had placement of Pleurx  catheter and subsequently underwent pleurodesis.      09/22/2013 Imaging    Repeat CT scan show no evidence of active disease. There are nonspecific lymphadenopathy and she is placed on observation.      03/23/2014 Imaging    Repeat CT scan of the chest, abdomen and pelvis show recurrence of cancer with widespread bilateral pulmonary metastasis.      05/14/2014 Imaging    Imaging of the chest and brain were repeated due to delay of initiation of chemotherapy. Overall, chest CT scan show stable disease. MRI of the head was negative for recurrence      05/16/2014 - 07/18/2014 Chemotherapy     she completed 4 cycles of combination chemotherapy with carboplatin and Alimta      07/16/2014 Imaging     repeat CT scan of the chest, abdomen and pelvis show regression in the size of lung nodules.      08/29/2014 - 08/14/2015 Chemotherapy    She received maintenance Alimta      10/16/2014 Imaging     repeat CT scan of the chest, abdomen and pelvis show regression in the size of lung nodules.      01/29/2015 Imaging    Repeat CT scan showed stable disease      02/01/2015 Imaging    MRI brain is negative      04/30/2015 Imaging    CT scan of the chest, abdomen and pelvis showed stable disease  07/25/2015 Imaging    MRI brain showed no evidence of new disease      09/09/2015 Imaging    CT chest showed interval increase in size of large RIGHT upper lobe nodule is most consistent with lung cancer recurrence.      09/18/2015 - 03/26/2016 Chemotherapy    She started on Nivolumab      10/22/2015 Imaging    Screening mammogram showed mild abnormality      10/31/2015 Imaging    Diagnostic mammogram showed mild calcification at the right upper outer breast      11/27/2015 Imaging    Stable post treatment related changes of left lower lobectomy and radiation therapy redemonstrated, as above. No definite findings  to suggest local recurrence of disease or metastatic disease on today's examination      01/24/2016 Imaging    MR brain showed continued stable appearance of two small treated brain metastases. No new or progressive metastatic disease to the brain.      04/15/2016 Imaging    Ct scan showed findings highly suspicious for recurrent tumor in the mediastinum surrounding the right mainstem bronchus and in the region of the azygoesophageal recess. Diffuse pulmonary metastatic disease with new and progressive nodules since the prior examination.      04/24/2016 -  Chemotherapy    The patient had chemotherapy with gemzar and Carboplatin      04/30/2016 Imaging    Decreased contrast enhancement of the two treated metastases in the right frontal and left occipital lobes. No new lesions.      06/01/2016 Imaging    CXR showed numerous metastatic nodules have progressed compared to the most recent comparison chest x-ray of 07/15/2015. Majority of these were noted on the 04/15/2016 chest CT. No segmental infiltrate or pulmonary edema.      06/19/2016 Adverse Reaction    Treatment is placed on hold today due to neutropenia and the patient not feeling well      06/29/2016 Imaging    Interval decrease in size of metastatic pulmonary nodules. 2. Persistent but slightly improved right hilar/mediastinal disease. No progressive findings. 3. No findings for upper abdominal metastatic disease       Brain metastases treated with radiosurgery   02/26/2012 Initial Diagnosis    Brain metastases treated with radiosurgery      ~  July 15, 2015:  Initial pulmonary consult by SN>   75 y/o BF referred by [facility] for COPD & dyspnea> she has a hx of stage 1 colon cancer diagnosed Nov2008, and stage 4 lung cancer diagnosed Jan2012...    Evlyn was diagnosed with Adenocarcinoma of lung Dx 05/2010 w/ LULobectomy, then 10/2011 mediastinal node Bx pos for metastatic lung  cancer, given chemoradiation, then 01/2012 diagnosed w/ brain mets treated w/ stereotactic radiosurg, followed by additional chemotherapy (maintenance Altima)- stopped 03/2013 due to severe fatigue;  She developed right pleural effusion, neg cytology, drained & pleurodesis 05/2013;  CT Chest 03/2014 showed widespread pulm mets- treated w/ Carboplatin/ Altima x 4cycles last 06/2014; follow up CT scans have shown regression in size of lung nodules, then stability...     She is a current everyday smoker- starting at age 31 & smoked for 50 yrs up to 1ppd, currently smoking 1/2-1ppd; she is not interested in smoking cessation help;  Prev PFTs showed mod airflow obstruction c/w GOLD Stage 3 COPD and last CXR 12/2013 showed norm heart size, right sided pot-a-cath, right effusion, incr soft tissue changes along the right paratrachel region- stable, NAD;  Last CT Chest 04/2015 showed norm heart size, atherosclerosis in Ao & coronaries, no definite new mediastinal or hilar adenopathy, s/p left upper lobectomy, septal thickening & micronodularity throughout right lung w/o change from 01/2015, chr pleural thickening & calcif + mucoid impactions (see full report)...    Roy is c/o SOB/DOE w/ any activity- ok at rest but says she feels like there is an elephant on her body weighing her down, she feels tight, occas wheezing, min cough- mostly irritative and more at night, no sput, no hemoptysis, denies CP but she is weak & has fallen several times;  She lives alone & is here w/ her cousin today... Current meds include>  AlbutHFA prn, IRJ18, Bystolic10, ACZYS06, T01SWF, Prav40, Prilosec40, Zanaflex2 prn, Tramadol50 prn, Prozac20, Remeron15... EXAM shows Afeb, VSS, O2sat=100% on RA at rest;  HEENT- neg, mallamapti1;  Chest- sl decr BS right base, clear w/o w/r/r;  Heart- RR w/o m/r/g;  Abd- soft, nontender, neg;  Ext- VI, 1+edema;  Neuro- gait abn & neuropathy...   CXR 07/15/15>  Norm heart size, post radiation scarring on right &  bibasilar scarring w/o change from prev, clips in left hilum & right sided port-a-cath stable...  Spirometry 07/15/15>  FVC=1.58 (63%), FEV1=0.98 (50%), %1sec=62, mid-flows are reduced at 26% predicted;  This is c/w mod airflow obstruction and GOLD Stage 2-3 COPD...  Ambulatory oximetry 07/15/15 on RA>  O2sat=100% on RA at rest w/ pulse=85/min; she ambulated only 1lap, stopped 1/2 way due to dizziness & was able to continue under her own power, lowest O2sat=99% w/ pulse=90/min... IMP >>      COPD/ Emphysema>  She has mod airflow obstruction and GOLD Stage 3 COPD; try ANORO one inhalation daily + ProairHFA rescue inhaler prn...    Stage 4 Adenocarcinoma of the Lung>  Followed by DrGorsuch regularly=> see Table of treatment modalities...    Chronic right pleural effusion>  Non-malignant on thoracenteses, s/p pleur-X cath drainage and pleurodesis...    Cigarette Smoker>  She is still smoking 1/2 to 1ppd (w/ a 50+pack-yr smoking hx) and we discussed cutting back some to start...    Hx Hypoxemic Resp Failure>  She has been on Home O2 from prev hospitalization but current oxygenation at rest & with walking is OK...    MEDICAL issues>  HBP. CAD, VI/ edema, HL, GERD, Hx colon cancer, DJD, LBP, anxiety/ depression; her PCP is [facility], DrMcDiarmid... PLAN >>     We discussed the imperative to quit smoking;  She will try to cut back;  We will start Rx w/ ANORO one inhalation daily & use the PROAIR-HFA as a rescue inhaler;  Finally we discussed a trial of low dose KLONOPIN 0.61m bid to see if it relaxes her & improves her dyspnea... We will f/u in 6wks...  ~  August 28, 2015:  6wk ROV w/ SN>      [facility]Adalyreturns [facility], PROAIR-HFA rescue inhaler prn, and KLONOPIN 0.556mid;  However she states that she isn't really taking any of these meds due to various perceived issues- not doing the Anoro daily (?but can't tell me why?- cost, other), and she states that she  "couldn't handle the Klonopin due to dizziness but she notes that it DID help her breathing;  She is still smoking but thinks that she's prob cut back to 1/2 ppd;  She had 3 f/u appts in March>   1) She's had f/u w/ DrGorsuch for Oncology> on maintenance chemotherapy (Altima- Q28d now) &  she says she is going to stay on this "to keep the cancer at [place]" 2) She had f/u DrSquires for RAD-ONC> s/p XRT for 2 brain mets and f/u MRI w/o recurrence; pt tells me "I am one of her miracle pts" 3) She's had f/u by her PCP- DrMcDiarmid for her dizziness, falls, 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     COPD/ Emphysema>  She has mod airflow obstruction and GOLD Stage 3 COPD; Rec- ANORO one inhalation daily + ProairHFA rescue inhaler prn...    Stage 4 Adenocarcinoma of the Lung>  Followed by DrGorsuch regularly & currently on maintenance ALTIMA Q28d regimen=> see Table of treatment modalities...    Chronic right pleural effusion>  Non-malignant on thoracenteses, s/p pleur-X cath drainage and pleurodesis...    Cigarette Smoker>  She is still smoking 1/2 to 1ppd (w/ a 50+pack-yr smoking hx) and we discussed cutting back some to start...    Hx Hypoxemic Resp Failure>  She has been on Home O2 from prev hospitalization but current oxygenation at rest & with walking is OK...    Severely deconditioned>  She has received [facility] outreach, and PT & Neuro-rehab from PCP for her deconditioning; she needs to quit all smoking and incr exercise regimen...Marland KitchenMarland KitchenMarland Kitchen     MEDICAL issues>  HBP. CAD, VI/ edema, HL, GERD, Hx colon cancer, DJD, LBP, anxiety/ depression, ANEMIA, NEUROPATHY; her PCP is [facility], DrMcDiarmid... EXAM shows Afeb, VSS, O2sat=99% on RA at rest;  HEENT- neg, mallamapti1;  Chest- sl decr BS right base, clear w/o w/r/r;  Heart- RR w/o m/r/g;  Abd- soft, nontender, neg;  Ext- VI, 1+edema;  Neuro- gait abn & neuropathy...  IMP/PLAN>>  We  discussed her progress & she is reminded of the NEED to quit all smoking, take the Anoro once daly every day, use the rescue inhaler as needed, and restart the Klonopin at 1/2 tab Bid;  Finally she needs to increase her exercise program... we will plan a routine ROV in 30mo  ~  November 28, 2015:  347moOV & OlBitanias still smoking ~1ppd despite all that she's been through; she says she feels well, breathing is good, walking w/ a cane & only prob is the heat & humidity;  She does her ADLs, light housework, walking some; She notes min cough, small amt thick beige sput, no hemoptysis; she denies CP, f/c/s, etc; she has known bronchiectasis & mucoid impactions and we reviewed how helpful it would be to quit smoking, take the max Mucinex 1200Bid w/ fluids & engage in chest physiotherpy w/ Flutter valve & postural drainage (but she declines) ... She is using her ANORO 1 inhalation daily & the Albut rescue inhaler as needed...     She saw DrGorsuch, Oncology 11/27/15> Now on treatment w/ OPDIVO & CT Chest showed good response w/ resolution of RUL nodule; they plan continued Rx QoWK w/ f/u CT in 29m42mo   EXAM shows Afeb, VSS, O2sat=100% on RA at rest;  HEENT- neg, mallamapti1;  Chest- sl decr BS right base, clear w/o w/r/r;  Heart- RR w/o m/r/g;  Abd- soft, nontender, neg;  Ext- VI, 1+edema;  Neuro- gait abn & neuropathy...   CT Chest 11/26/15>  S/p LLLobectomy & radiation changes, chronic areas of mucoid impaction throughout the right lung are similar to prev, Ao & coronary atherosclerosis. Ectasia of infrrenal Ao IMP/PLAN>>  OliAlicya rec to quit smoking & continue the Anoro &  Albut rescue prn; we reviewed the benefits derived from smojking cessation + Mucinex/ fluids/ chest physiotherapy/ etc but she declines these latter treatments... we will plan ROV recheck in 26mo, sooner prn...   ~  June 01, 2016:  43mo ROV & pulmonary follow up visit>  Unfortunately Maisen has had recurrence of her metastatic lung cancer  (adenoca) & DrGorsuch has switched her Altima to Gemzar & Cisplatin...     COPD/ Emphysema>  Demiya continues to smoke (albeit now down to 1/2 ppd) & has been extensively counseled by mult physicians on mult occas to no avail; she take [facility] one daily & reports that her breathing is at baseline- stoic, she denies cough, sput, hemoptysis or SOB; notes chr stable DOE, no f/c/s, no CP, etc...    Problems as noted above... EXAM shows Afeb, VSS, O2sat=100% on RA at rest;  HEENT- neg, mallamapti1;  Chest- sl decr BS right base, clear w/o w/r/r;  Heart- RR w/o m/r/g;  Abd- soft, nontender, neg;  Ext- VI, 1+edema;  Neuro- gait abn & neuropathy...   CT Chest 04/15/16 showed norm heart size, advanced atherosclerotic calcif w/ tortuosity & ectasia of ao, 3 vessel CAD; numerous new pulm & enlarging nodules c/w widespread metastatic dis, +mediastinal changes...  MRI Brain 04/30/16 showed decreased contrast enhancement of the two treated mets in right frontal & left occipital lobes, no new lesions noted...  CXR 06/01/16 ((independently reviewed by me in the PACS system) showed norm heart size, tort Ao, numerous metastatic nodules are thought to have progressed compared to CXR of 06/2015 IMP/PLAN>>  Recurrent metastatic Adenoca of lung on Chemo per DrGorsuch; Floriene is implored to quit all smoking & take the [facility] daily; she will call for any resp issues 7 we will plan rov recheck in 23mo...   ~  September 09, 2016:  95mo ROV & Tyshawna continues to smoke 1ppd stating "I'm like a crack-head and there's no rehab center. God's looking out for me" she tells me about a friend who recently passed w/ HepC, cirrhosis & liver cancer.Marland KitchenMarland Kitchen     1) she had f/u ONCOLOGY-DrGorsuch 08/21/16> Hx colon cancer 2008 w/ sigmoid resection, Hx lung cancer 2012 w/ LN & brain mets 2013; she remains on Gemzar & carboplatin + neulasta; she reports "it's rough"...     2) she went to the ER 09-28-16 after her friend died, c/o SOB, couldn't get a deep breath,  exam was clear, O2sat=100% on RA, CXR w/o change from [id number], given NEB treatment & supportive care...     3) followed by [facility]> RN coach phone calls reviewed...    COPD/ Emphysema>  She has mod airflow obstruction and GOLD Stage 3 COPD; Rec- ANORO one inhalation daily + ProairHFA rescue inhaler prn; PCP added Atrovent-HFA for prn use as well...    Stage 4 Adenocarcinoma of the Lung>  Followed by DrGorsuch regularly & currently on Gemzar, Carboplatin, Neulasta regimen=> see Table of treatment timeline...    Chronic right pleural effusion>  Non-malignant on thoracenteses, s/p pleur-X cath drainage and pleurodesis...    Cigarette Smoker>  She is still smoking 1/2 to 1ppd (w/ a 50+pack-yr smoking hx) and we discussed cutting back some to start...    Hx Hypoxemic Resp Failure>  She has been on Home O2 from prev hospitalization but current oxygenation at rest & with walking is OK...    Severely deconditioned>  She has received [facility] outreach, and PT & Neuro-rehab from PCP for her deconditioning; she needs to quit  all smoking and incr exercise regimen...Marland KitchenMarland KitchenMarland Kitchen     MEDICAL issues>  HBP. CAD, VI/ edema, HL, GERD, Hx colon cancer, DJD, LBP, anxiety/ depression, ANEMIA, NEUROPATHY; her PCP is [facility], DrMcDiarmid... EXAM shows Afeb, VSS, O2sat=100% on RA at rest;  HEENT- neg, mallamapti1;  Chest- sl decr BS right base, clear w/o w/r/r;  Heart- RR w/o m/r/g;  Abd- soft, nontender, neg;  Ext- VI, 1+edema;  Neuro- gait abn & neuropathy...   CXR 08/29/16>  Norm heart size, right perihilar & paratracheal soft tissue fullness w/o change, sm right pleural effusion, mild bibasilar atx, right sided port-a-cath, NAD...   LABS 08/2016 in epic> [place]- ok x Alb=3.0;  CBC- anemic w/ Hg=9.3 IMP/PLAN>>  Erline desperately need to quit smoking but has no intention of trying; REC to use the Ventolin & Atrovent 2 sprays of each Bid, and the ANORO once daily...     Past Medical History:  Diagnosis  Date  . Abnormality of gait 09/10/2015  . Acute on chronic respiratory failure with hypoxemia ([place]) 07/11/2015  . Anemia due to chemotherapy 08/29/2014  . 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 Angina pectoris ([place]) 03/05/2016  . Arthritis   . ASCUS with positive high risk HPV 04/21/2012   colpo clinically normal 04/2012.  Given her other significant co morbidities (recurrent lung cancer, brain mets and colon cancer requiring chronic chemo) I don't think I would be very aggressive in pursuing any more paps. If she felt necessary, could do a repeat in one year, but the likelihood is that this is the least of her problems and I doubt she will benefit from repeated paps and colpos and I sincerely doubt anything we find would change the course of her life. We discussed. She has already decided to consider stopping chemo in May because after 5 years she is quite tired of it. I would always be willing to do a pap, but my recommendation is to stop.   . Back pain   . Benign paroxysmal positional vertigo 08/16/2015  . Bilateral hearing loss, Mild 02/27/2016   03/23/16 Hearing loss Evaluation by Unice Bailey, MD, [facility], Dx with mild bilateral hearing loss except normal hearing in right ear of 500 Hz and 1000 Hz.  Dr Redmond Baseman recommends annual hearing evaluations.   . Bilateral leg edema 10/17/2014  . Bilateral pleural effusion 08/09/2013  . Blurred vision, bilateral 06/06/2014  . Brain metastases ([place]) 02/15/12  . Cerebral aneurysm   . CEREBRAL ANEURYSM 12/29/2006   Qualifier: Diagnosis of  By: Girard Cooter MD, MAKEECHA    . Chronic dermatitis of hands 02/27/2016  . Chronic folliculitis    of groin  . Colon cancer ([place]) 11/08  . COPD (chronic obstructive pulmonary disease) ([place])   . COPD mixed type ([place]) 07/15/2015  . Coronary artery disease   . Coronary atherosclerosis 09/09/2007   Qualifier: Diagnosis of  By: Martinique, Bonnie    . Depression   . Depressive disorder   . DEPRESSIVE  DISORDER, MAJOR, RCR, MILD 12/29/2006   Qualifier: Diagnosis of  By: Girard Cooter MD, MAKEECHA    . Dyspnea 07/15/2015  . Encounter for antineoplastic chemotherapy 09/11/2015  . Encounter for therapeutic drug monitoring 09/11/2015  . Essential hypertension 12/29/2006   Echocardiogram (07/26/13): Left ventricle: The cavity size was normal. Wall thickness was increased in a pattern of mild LVH. Systolic function was normal. The estimated ejection fraction was in the range of 55% to 60%. Wall motion was normal; there  were no regional wall motion abnormalities. Normal mitral valve.    . Falls frequently 08/02/2015  . Family history of Huntington's disease   . Family history of trichomonal vaginitis 05/2005  . Fatigue 02/06/2013  . 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 Fibrocystic breast changes   . GERD 12/29/2006   Qualifier: Diagnosis of  By: Girard Cooter MD, MAKEECHA    . GERD (gastroesophageal reflux disease)   . History of colon cancer 05/31/2013  . Hypercholesterolemia   . HYPERCHOLESTEROLEMIA 12/29/2006   Qualifier: Diagnosis of  By: Girard Cooter MD, MAKEECHA    . Hyperlipidemia   . Hypertension   . Hypokalemia 08/09/2013  . Insomnia 10/05/2011  . Left-sided low back pain with left-sided sciatica 07/18/2014  . Low back pain with sciatica 06/22/2014  . Lung cancer ([place]) 06/16/10   PET scan 04/28/2010; primary: increase in size 02/2010 / Well Differentiated Adenocarcinoma of the lung   . Lung nodule    FNA ordered for 04/02/10 by HA>pos Ca  . LVH (left ventricular hypertrophy) 12/20/2014   Echocardiogram (07/26/13): Left ventricle: Wall thickness was increased in a pattern of mild LVH. The cavity size was normal. Systolic function was normal. The estimated ejection fraction was in the range of 55% to 60%. Wall motion was normal; there were no regional wall motion abnormalities. Normal mitral valve.   . On antineoplastic chemotherapy started 02/2012   Alimta  . Other fatigue 11/28/2014  . Pleural  effusion 05/03/2013  . 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 Postmenopausal   . POSTMENOPAUSAL SYNDROME 01/31/2009   Qualifier: Diagnosis of  By: Carlena Sax  MD, Colletta [place]    . Postural dizziness 06/19/2015  . Protein-calorie malnutrition, moderate ([place]) 09/11/2015  . S/P radiation therapy 03/01/12   SRS: 1 fraction / 20 Gray each to the Left Occipital Region and to the Right Insular Metastases  . Sensorineural hearing loss (SNHL) of both ears 04/23/2016   Dr Keturah Barre. Redmond Baseman (ENT) dx significant bilateral hearing loss and recommended biaural hearing aids.   . Shortness of breath   . Skin rash 10/03/2015  . Status post chemotherapy comp. 12/29/11   Carboplatin/Taxol  . Status post radiation therapy 11/16/11 - 12/29/11   Right Lung and Mediastinum: 60 Gy  . Status post stereotactic radiosurgery 01/2012   for Brain Metastases  . Tobacco dependence   . Vitamin D deficiency 08/30/2015  . Weight loss 10/22/2011    Past Surgical History:  Procedure Laterality Date  . BACK SURGERY     Dr Luiz Ochoa  . BREAST SURGERY     Bil lumpectomy  . cardiac cath x3    . CHEST TUBE INSERTION Right 06/12/2013   Procedure: INSERTION PLEURAL DRAINAGE CATHETER;  Surgeon: Gaye Pollack, MD;  Location: [place];  Service: Thoracic;  Laterality: Right;  . COLECTOMY  03/22/07   Stage 1 pT2 N0, M0 Adenocarcinoma of the sigmoid  colon  . HERNIA REPAIR    . LUNG LOBECTOMY  06/16/10   Left Upper Lobectomy  . MEDIASTINOSCOPY  10/19/2011   Procedure: MEDIASTINOSCOPY;  Surgeon: Gaye Pollack, MD;  Location: [place];  Service: Thoracic;  Laterality: N/A;  . TALC PLEURODESIS Right 06/12/2013   Procedure: Pietro Cassis;  Surgeon: Gaye Pollack, MD;  Location: [place];  Service: Thoracic;  Laterality: Right;  . TUBAL LIGATION    . TUNNELED VENOUS CATHETER PLACEMENT     Port-a-Cath    Outpatient Encounter Prescriptions as of 09/09/2016  Medication Sig  .  aspirin 81 MG EC tablet Take 81 mg by mouth daily.   Marland Kitchen  BYSTOLIC 10 MG tablet Take 10 mg by mouth daily.   . cholecalciferol (VITAMIN D) 1000 units tablet Take 1,000 Units by mouth daily.   Marland Kitchen COLACE 100 MG capsule Take 100 mg by mouth daily as needed for mild constipation.   Marland Kitchen HYDROmorphone (DILAUDID) 8 MG tablet Take 1 tablet (8 mg total) by mouth every 6 (six) hours as needed for severe pain.  Marland Kitchen ipratropium (ATROVENT HFA) 17 MCG/ACT inhaler Inhale 2 puffs into the lungs every 4 (four) hours as needed for wheezing (Cough).  . lidocaine-prilocaine (EMLA) cream Apply topically as needed. Apply to porta cath site one hour prior to needle stick.  Marland Kitchen loratadine (CLARITIN) 10 MG tablet Take 10 mg by mouth daily.  . nitroGLYCERIN (NITROSTAT) 0.4 MG SL tablet Place 1 tablet (0.4 mg total) under the tongue every 5 (five) minutes as needed. For chest pain  . omeprazole (PRILOSEC) 40 MG capsule Take 40 mg by mouth daily.   . ondansetron (ZOFRAN ODT) 4 MG disintegrating tablet Take 1 tablet (4 mg total) by mouth every 8 (eight) hours as needed for nausea or vomiting.  . pravastatin (PRAVACHOL) 40 MG tablet Take 40 mg by mouth daily.   Marland Kitchen umeclidinium-vilanterol (ANORO ELLIPTA) 62.5-25 MCG/INH AEPB Inhale 1 puff into the lungs daily.  . VENTOLIN HFA 108 (90 Base) MCG/ACT inhaler Inhale 2 puffs into the lungs every 4 (four) hours as needed for wheezing or shortness of breath.  . [DISCONTINUED] ipratropium (ATROVENT HFA) 17 MCG/ACT inhaler Inhale 2 puffs into the lungs every 4 (four) hours as needed for wheezing (Cough).  . [DISCONTINUED] umeclidinium-vilanterol (ANORO ELLIPTA) 62.5-25 MCG/INH AEPB Inhale 1 puff into the lungs daily.  . [DISCONTINUED] VENTOLIN HFA 108 (90 Base) MCG/ACT inhaler Inhale 2 puffs into the lungs every 4 (four) hours as needed for wheezing or shortness of breath.   . predniSONE (DELTASONE) 10 MG tablet Take 1 tablet (10 mg total) by mouth daily with breakfast. (Patient not taking: Reported on 09/09/2016)   Facility-Administered Encounter  Medications as of 09/09/2016  Medication  . sodium chloride 0.9 % injection 10 mL  . sodium chloride 0.9 % injection 10 mL    Allergies  Allergen Reactions  . Adhesive [Tape] Rash  . Lisinopril Cough    Current Medications, Allergies, Past Medical History, Past Surgical History, Family History, and Social History were reviewed in [employer] record.   Review of Systems             All symptoms NEG except where BOLDED >>  Constitutional:  F/C/S, fatigue, anorexia, unexpected weight change. HEENT:  HA, visual changes, hearing loss, earache, nasal symptoms, sore throat, mouth sores, hoarseness. Resp:  cough, sputum, hemoptysis; SOB, tightness, wheezing. Cardio:  CP, palpit, DOE, orthopnea, edema. GI:  N/V/D/C, blood in stool; reflux, abd pain, distention, gas. GU:  dysuria, freq, urgency, hematuria, flank pain, voiding difficulty. MS:  joint pain, swelling, tenderness, decr ROM; neck pain, back pain, etc. Neuro:  HA, tremors, seizures, dizziness, syncope, weakness, numbness, gait abn. Skin:  suspicious lesions or skin rash. Heme:  adenopathy, bruising, bleeding. Psyche:  confusion, agitation, sleep disturbance, hallucinations, anxiety, depression suicidal.   Objective:   Physical Exam       Vital Signs:  Reviewed...   General:  WD, WN, 66 y/o BF in NAD, chr ill appearing; alert & oriented; pleasant & cooperative... HEENT:  [place]/AT; Conjunctiva- pink, Sclera- nonicteric, EOM-wnl,  PERRLA, EACs-wax, TMs-no vis; NOSE-clear; THROAT-clear & wnl.  Neck:  Supple w/ decr ROM; no JVD; normal carotid impulses w/o bruits; no thyromegaly or nodules palpated; no lymphadenopathy.  Chest:  Sl decr BS right base, otherw clear without wheezes, rales, or rhonchi heard. Heart:  Regular Rhythm; norm S1 & S2 without murmurs, rubs, or gallops detected. Abdomen:  Soft & nontender- no guarding or rebound; normal bowel sounds; no organomegaly or masses palpated. Ext:  decr ROM;  without deformities, +arthritic changes; no varicose veins, +venous insuffic, 1+edema;  Pulses intact w/o bruits. Neuro:  CNs II-XII intact; motor testing normal; sensory testing abnormal; gait abnormal & balance if fair Derm:  No lesions noted; no rash etc. Lymph:  No cervical, supraclavicular, axillary, or inguinal adenopathy palpated.   Assessment:      IMP >>      COPD/ Emphysema>  She has mod airflow obstruction and GOLD Stage 3 COPD; Rec- continue ANORO one inhalation daily + ProairHFA rescue inhaler prn...    Stage 4 Adenocarcinoma of the Lung>  Followed by DrGorsuch regularly & currently on maintenance OPDIVO Q14d regimen=> see Table of treatment modalities...    Chronic right pleural effusion>  Non-malignant on thoracenteses, s/p pleur-X cath drainage and pleurodesis...    Cigarette Smoker>  She is still smoking 1ppd (w/ a 50+pack-yr smoking hx) and we discussed cutting back some to start...    Hx Hypoxemic Resp Failure>  She has been on Home O2 from prev hospitalization but current oxygenation at rest & with walking is OK...    Severely deconditioned>  She has received [facility] outreach, and PT & Neuro-rehab from PCP for her deconditioning; she needs to quit all smoking and incr exercise regimen...Marland KitchenMarland KitchenMarland Kitchen     MEDICAL issues>  HBP. CAD, VI/ edema, HL, GERD, Hx colon cancer, DJD, LBP, anxiety/ depression, ANEMIA, NEUROPATHY; her PCP is [facility], DrMcDiarmid...  PLAN >>  07/15/15>   We discussed the imperative to quit smoking;  She will try to cut back;  We will start Rx w/ ANORO one inhalation daily & use the PROAIR-HFA as a rescue inhaler;  Finally we discussed a trial of low dose KLONOPIN 0.'25mg'$  bid to see if it relaxes her & improves her dyspnea... 08/28/15>   We discussed her progress & she is reminded of the NEED to quit all smoking, take the Anoro once daly every day, use the rescue inhaler as needed, and restart the Klonopin at 1/2 tab Bid;  Finally she needs to increase her  exercise program... . 11/28/15>   Ceili is rec to quit smoking & continue the Anoro & Albut rescue prn; we reviewed the benefits derived from smojking cessation + Mucinex/ fluids/ chest physiotherapy/ etc but she declines these latter treatments... we will plan ROV recheck in 85mo sooner prn 06/01/16>   Recurrent metastatic [place] of lung on Chemo per DrGorsuch;  OSidneeis implored to quit all smoking & [facility]; she will call for any resp issues 7 we will plan rov recheck in 35mo4/25/18>   Marielouise desperately need to quit smoking but has no intention of trying; REC to use the Ventolin & Atrovent 2 sprays of each Bid, and the ANORO once daily.   Plan:     Patient's Medications  New Prescriptions   No medications on file  Previous Medications   ASPIRIN 81 MG EC TABLET    Take 81 mg by mouth daily.    BYSTOLIC 10 MG TABLET  Take 10 mg by mouth daily.    CHOLECALCIFEROL (VITAMIN D) 1000 UNITS TABLET    Take 1,000 Units by mouth daily.    COLACE 100 MG CAPSULE    Take 100 mg by mouth daily as needed for mild constipation.    HYDROMORPHONE (DILAUDID) 8 MG TABLET    Take 1 tablet (8 mg total) by mouth every 6 (six) hours as needed for severe pain.   LIDOCAINE-PRILOCAINE (EMLA) CREAM    Apply topically as needed. Apply to porta cath site one hour prior to needle stick.   LORATADINE (CLARITIN) 10 MG TABLET    Take 10 mg by mouth daily.   NITROGLYCERIN (NITROSTAT) 0.4 MG SL TABLET    Place 1 tablet (0.4 mg total) under the tongue every 5 (five) minutes as needed. For chest pain   OMEPRAZOLE (PRILOSEC) 40 MG CAPSULE    Take 40 mg by mouth daily.    ONDANSETRON (ZOFRAN ODT) 4 MG DISINTEGRATING TABLET    Take 1 tablet (4 mg total) by mouth every 8 (eight) hours as needed for nausea or vomiting.   PRAVASTATIN (PRAVACHOL) 40 MG TABLET    Take 40 mg by mouth daily.    PREDNISONE (DELTASONE) 10 MG TABLET    Take 1 tablet (10 mg total) by mouth daily with breakfast.  Modified Medications    Modified Medication Previous Medication   IPRATROPIUM (ATROVENT HFA) 17 MCG/ACT INHALER ipratropium (ATROVENT HFA) 17 MCG/ACT inhaler      Inhale 2 puffs into the lungs every 4 (four) hours as needed for wheezing (Cough).    Inhale 2 puffs into the lungs every 4 (four) hours as needed for wheezing (Cough).   UMECLIDINIUM-VILANTEROL (ANORO ELLIPTA) 62.5-25 MCG/INH AEPB umeclidinium-vilanterol (ANORO ELLIPTA) 62.5-25 MCG/INH AEPB      Inhale 1 puff into the lungs daily.    Inhale 1 puff into the lungs daily.   VENTOLIN HFA 108 (90 BASE) MCG/ACT INHALER VENTOLIN HFA 108 (90 Base) MCG/ACT inhaler      Inhale 2 puffs into the lungs every 4 (four) hours as needed for wheezing or shortness of breath.    Inhale 2 puffs into the lungs every 4 (four) hours as needed for wheezing or shortness of breath.   Discontinued Medications   No medications on file

## 2016-09-11 ENCOUNTER — Other Ambulatory Visit: Payer: Medicare HMO

## 2016-09-11 ENCOUNTER — Ambulatory Visit (HOSPITAL_BASED_OUTPATIENT_CLINIC_OR_DEPARTMENT_OTHER): Payer: Medicare HMO

## 2016-09-11 ENCOUNTER — Ambulatory Visit: Payer: Medicare HMO

## 2016-09-11 VITALS — BP 136/69 | HR 82 | Temp 98.1°F | Resp 19

## 2016-09-11 DIAGNOSIS — C3412 Malignant neoplasm of upper lobe, left bronchus or lung: Secondary | ICD-10-CM

## 2016-09-11 DIAGNOSIS — Z5111 Encounter for antineoplastic chemotherapy: Secondary | ICD-10-CM | POA: Diagnosis not present

## 2016-09-11 DIAGNOSIS — C7931 Secondary malignant neoplasm of brain: Secondary | ICD-10-CM

## 2016-09-11 DIAGNOSIS — C7949 Secondary malignant neoplasm of other parts of nervous system: Principal | ICD-10-CM

## 2016-09-11 LAB — CBC WITH DIFFERENTIAL/PLATELET
BASO%: 0.3 % (ref 0.0–2.0)
BASOS ABS: 0 10*3/uL (ref 0.0–0.1)
EOS ABS: 0.1 10*3/uL (ref 0.0–0.5)
EOS%: 0.5 % (ref 0.0–7.0)
HCT: 30.6 % — ABNORMAL LOW (ref 34.8–46.6)
HGB: 9.4 g/dL — ABNORMAL LOW (ref 11.6–15.9)
LYMPH%: 10.2 % — AB (ref 14.0–49.7)
MCH: 26.9 pg (ref 25.1–34.0)
MCHC: 30.7 g/dL — AB (ref 31.5–36.0)
MCV: 87.4 fL (ref 79.5–101.0)
MONO#: 1 10*3/uL — ABNORMAL HIGH (ref 0.1–0.9)
MONO%: 11.1 % (ref 0.0–14.0)
NEUT#: 7.2 10*3/uL — ABNORMAL HIGH (ref 1.5–6.5)
NEUT%: 77.9 % — AB (ref 38.4–76.8)
NRBC: 0 % (ref 0–0)
PLATELETS: 210 10*3/uL (ref 145–400)
RBC: 3.5 10*6/uL — AB (ref 3.70–5.45)
RDW: 21.1 % — AB (ref 11.2–14.5)
WBC: 9.3 10*3/uL (ref 3.9–10.3)
lymph#: 0.9 10*3/uL (ref 0.9–3.3)

## 2016-09-11 LAB — COMPREHENSIVE METABOLIC PANEL
ALT: <6 U/L (ref 0–55)
AST: 9 U/L (ref 5–34)
Albumin: 3 g/dL — ABNORMAL LOW (ref 3.5–5.0)
Alkaline Phosphatase: 151 U/L — ABNORMAL HIGH (ref 40–150)
Anion Gap: 7 mEq/L (ref 3–11)
BILIRUBIN TOTAL: 0.29 mg/dL (ref 0.20–1.20)
BUN: 9.8 mg/dL (ref 7.0–26.0)
CO2: 24 meq/L (ref 22–29)
Calcium: 8.8 mg/dL (ref 8.4–10.4)
Chloride: 111 mEq/L — ABNORMAL HIGH (ref 98–109)
Creatinine: 1.1 mg/dL (ref 0.6–1.1)
EGFR: 63 mL/min/{1.73_m2} — AB (ref >90)
GLUCOSE: 103 mg/dL (ref 70–140)
POTASSIUM: 4.2 meq/L (ref 3.5–5.1)
SODIUM: 143 meq/L (ref 136–145)
TOTAL PROTEIN: 6.5 g/dL (ref 6.4–8.3)

## 2016-09-11 MED ORDER — PALONOSETRON HCL INJECTION 0.25 MG/5ML
INTRAVENOUS | Status: AC
  Filled 2016-09-11: qty 5

## 2016-09-11 MED ORDER — HEPARIN SOD (PORK) LOCK FLUSH 100 UNIT/ML IV SOLN
500.0000 [IU] | Freq: Once | INTRAVENOUS | Status: AC | PRN
  Administered 2016-09-11: 500 [IU]
  Filled 2016-09-11: qty 5

## 2016-09-11 MED ORDER — CARBOPLATIN CHEMO INTRADERMAL TEST DOSE 100MCG/0.02ML
100.0000 ug | Freq: Once | INTRADERMAL | Status: AC
  Administered 2016-09-11: 100 ug via INTRADERMAL
  Filled 2016-09-11: qty 0.02

## 2016-09-11 MED ORDER — DEXAMETHASONE SODIUM PHOSPHATE 10 MG/ML IJ SOLN
INTRAMUSCULAR | Status: AC
  Filled 2016-09-11: qty 1

## 2016-09-11 MED ORDER — SODIUM CHLORIDE 0.9 % IV SOLN
134.5600 mg | Freq: Once | INTRAVENOUS | Status: AC
  Administered 2016-09-11: 130 mg via INTRAVENOUS
  Filled 2016-09-11: qty 13

## 2016-09-11 MED ORDER — SODIUM CHLORIDE 0.9 % IV SOLN
500.0000 mg/m2 | Freq: Once | INTRAVENOUS | Status: AC
  Administered 2016-09-11: 912 mg via INTRAVENOUS
  Filled 2016-09-11: qty 23.99

## 2016-09-11 MED ORDER — SODIUM CHLORIDE 0.9% FLUSH
10.0000 mL | INTRAVENOUS | Status: DC | PRN
  Administered 2016-09-11: 10 mL
  Filled 2016-09-11: qty 10

## 2016-09-11 MED ORDER — PALONOSETRON HCL INJECTION 0.25 MG/5ML
0.2500 mg | Freq: Once | INTRAVENOUS | Status: AC
  Administered 2016-09-11: 0.25 mg via INTRAVENOUS

## 2016-09-11 MED ORDER — SODIUM CHLORIDE 0.9 % IJ SOLN
10.0000 mL | INTRAMUSCULAR | Status: DC | PRN
  Administered 2016-09-11: 10 mL via INTRAVENOUS
  Filled 2016-09-11: qty 10

## 2016-09-11 MED ORDER — SODIUM CHLORIDE 0.9 % IV SOLN
Freq: Once | INTRAVENOUS | Status: AC
  Administered 2016-09-11: 13:00:00 via INTRAVENOUS

## 2016-09-11 MED ORDER — DEXAMETHASONE SODIUM PHOSPHATE 10 MG/ML IJ SOLN
10.0000 mg | Freq: Once | INTRAMUSCULAR | Status: AC
Start: 2016-09-11 — End: 2016-09-11
  Administered 2016-09-11: 10 mg via INTRAVENOUS

## 2016-09-11 NOTE — Progress Notes (Signed)
[place] Counseling Note  Counselor met pt in infusion room for their counseling appointment. Pt stated that she was "too tired for counseling today," and requested that counselor come back on 5/11.  [place] Pam, [place] LPCA [place]

## 2016-09-11 NOTE — Patient Instructions (Signed)

## 2016-09-11 NOTE — Patient Instructions (Signed)
[facility] Discharge Instructions for Patients Receiving Chemotherapy  Today you received the following chemotherapy agents: Gemzar and Carboplatin   To help prevent nausea and vomiting after your treatment, we encourage you to take your nausea medication as directed.    If you develop nausea and vomiting that is not controlled by your nausea medication, call the clinic.   BELOW ARE SYMPTOMS THAT SHOULD BE REPORTED IMMEDIATELY:  *FEVER GREATER THAN 100.5 F  *CHILLS WITH OR WITHOUT FEVER  NAUSEA AND VOMITING THAT IS NOT CONTROLLED WITH YOUR NAUSEA MEDICATION  *UNUSUAL SHORTNESS OF BREATH  *UNUSUAL BRUISING OR BLEEDING  TENDERNESS IN MOUTH AND THROAT WITH OR WITHOUT PRESENCE OF ULCERS  *URINARY PROBLEMS  *BOWEL PROBLEMS  UNUSUAL RASH Items with * indicate a potential emergency and should be followed up as soon as possible.  Feel free to call the clinic you have any questions or concerns. The clinic phone number is [phone].  Please show the CHEMO ALERT CARD at check-in to the Emergency Department and triage nurse.   

## 2016-09-14 ENCOUNTER — Other Ambulatory Visit: Payer: Self-pay | Admitting: *Deleted

## 2016-09-14 ENCOUNTER — Telehealth: Payer: Self-pay

## 2016-09-14 NOTE — Telephone Encounter (Signed)
Patient called and left message. Called patient back she is complaining of  having pressure in her chest that is going down her left arm. She states that she has had the pressure for a couple of days.  Instructed patient to call 911 or go ER, states she will

## 2016-09-14 NOTE — Patient Outreach (Signed)
[facility]) Care Management  09/14/2016  DENAI CABA [birth year] [id number]   RN Health Coach received telephone call from patient.  [employer] compliance verified. Per patient she is having slight pain in her left chest and a tingling feeling in her left arm.  Patient stated, " I wanted to know what to do maybe I  just needto lay down and rest.   RN asked patient how long this is been happening. Any shortness of breath. Per patient no more than usual. It started today. I may have slept on my arm.  Patient stated, Its not bad pain but I feel it in down my arm. RN told patient this needs to be checked out.  RN informed patient that she just had chemotherapy therapy on the 27 th. The doctor will want to make sure that it not something more serious like a heart attack.  RN told patient to call her doctor immediately and if pain increases go to the hospital as soon as possible. RN confirmed that patient  was calling doctor immediately.  call the clinic you have any questions or concerns. The clinic phone number is [phone].  Please show the [place] at check-in to the Emergency Department and triage nurse.  [place] [facility] [phone]

## 2016-09-18 ENCOUNTER — Other Ambulatory Visit: Payer: Self-pay | Admitting: *Deleted

## 2016-09-18 ENCOUNTER — Ambulatory Visit (HOSPITAL_BASED_OUTPATIENT_CLINIC_OR_DEPARTMENT_OTHER): Payer: Medicare HMO

## 2016-09-18 ENCOUNTER — Other Ambulatory Visit (HOSPITAL_BASED_OUTPATIENT_CLINIC_OR_DEPARTMENT_OTHER): Payer: Medicare HMO

## 2016-09-18 ENCOUNTER — Ambulatory Visit: Payer: Medicare HMO

## 2016-09-18 ENCOUNTER — Ambulatory Visit (HOSPITAL_BASED_OUTPATIENT_CLINIC_OR_DEPARTMENT_OTHER): Payer: Medicare HMO | Admitting: Hematology and Oncology

## 2016-09-18 ENCOUNTER — Telehealth: Payer: Self-pay | Admitting: Hematology and Oncology

## 2016-09-18 VITALS — BP 156/68 | HR 82 | Temp 98.3°F | Resp 18 | Ht 64.0 in | Wt 161.9 lb

## 2016-09-18 DIAGNOSIS — Z5111 Encounter for antineoplastic chemotherapy: Secondary | ICD-10-CM | POA: Diagnosis not present

## 2016-09-18 DIAGNOSIS — C3412 Malignant neoplasm of upper lobe, left bronchus or lung: Secondary | ICD-10-CM

## 2016-09-18 DIAGNOSIS — F17200 Nicotine dependence, unspecified, uncomplicated: Secondary | ICD-10-CM | POA: Diagnosis not present

## 2016-09-18 DIAGNOSIS — T451X5A Adverse effect of antineoplastic and immunosuppressive drugs, initial encounter: Secondary | ICD-10-CM

## 2016-09-18 DIAGNOSIS — C7949 Secondary malignant neoplasm of other parts of nervous system: Secondary | ICD-10-CM | POA: Diagnosis not present

## 2016-09-18 DIAGNOSIS — C7931 Secondary malignant neoplasm of brain: Secondary | ICD-10-CM

## 2016-09-18 DIAGNOSIS — G620 Drug-induced polyneuropathy: Secondary | ICD-10-CM

## 2016-09-18 DIAGNOSIS — Z5189 Encounter for other specified aftercare: Secondary | ICD-10-CM | POA: Diagnosis not present

## 2016-09-18 DIAGNOSIS — C3400 Malignant neoplasm of unspecified main bronchus: Secondary | ICD-10-CM

## 2016-09-18 DIAGNOSIS — D61818 Other pancytopenia: Secondary | ICD-10-CM

## 2016-09-18 LAB — COMPREHENSIVE METABOLIC PANEL
ALBUMIN: 3.1 g/dL — AB (ref 3.5–5.0)
ALT: 9 U/L (ref 0–55)
AST: 11 U/L (ref 5–34)
Alkaline Phosphatase: 127 U/L (ref 40–150)
Anion Gap: 9 mEq/L (ref 3–11)
BUN: 9.2 mg/dL (ref 7.0–26.0)
CALCIUM: 8.9 mg/dL (ref 8.4–10.4)
CHLORIDE: 109 meq/L (ref 98–109)
CO2: 25 mEq/L (ref 22–29)
CREATININE: 1.1 mg/dL (ref 0.6–1.1)
EGFR: 60 mL/min/{1.73_m2} — ABNORMAL LOW (ref >90)
Glucose: 89 mg/dl (ref 70–140)
Potassium: 3.8 mEq/L (ref 3.5–5.1)
Sodium: 143 mEq/L (ref 136–145)
TOTAL PROTEIN: 6.6 g/dL (ref 6.4–8.3)
Total Bilirubin: 0.37 mg/dL (ref 0.20–1.20)

## 2016-09-18 LAB — CBC WITH DIFFERENTIAL/PLATELET
BASO%: 1 % (ref 0.0–2.0)
Basophils Absolute: 0 10*3/uL (ref 0.0–0.1)
EOS%: 0.6 % (ref 0.0–7.0)
Eosinophils Absolute: 0 10*3/uL (ref 0.0–0.5)
HCT: 29.8 % — ABNORMAL LOW (ref 34.8–46.6)
HEMOGLOBIN: 9.5 g/dL — AB (ref 11.6–15.9)
LYMPH#: 0.6 10*3/uL — AB (ref 0.9–3.3)
LYMPH%: 17.7 % (ref 14.0–49.7)
MCH: 27.3 pg (ref 25.1–34.0)
MCHC: 32 g/dL (ref 31.5–36.0)
MCV: 85.3 fL (ref 79.5–101.0)
MONO#: 0.5 10*3/uL (ref 0.1–0.9)
MONO%: 14.7 % — ABNORMAL HIGH (ref 0.0–14.0)
NEUT%: 66 % (ref 38.4–76.8)
NEUTROS ABS: 2.2 10*3/uL (ref 1.5–6.5)
Platelets: 202 10*3/uL (ref 145–400)
RBC: 3.49 10*6/uL — ABNORMAL LOW (ref 3.70–5.45)
RDW: 21.6 % — AB (ref 11.2–14.5)
WBC: 3.4 10*3/uL — AB (ref 3.9–10.3)

## 2016-09-18 MED ORDER — SODIUM CHLORIDE 0.9 % IV SOLN
Freq: Once | INTRAVENOUS | Status: AC
  Administered 2016-09-18: 11:00:00 via INTRAVENOUS

## 2016-09-18 MED ORDER — DEXAMETHASONE SODIUM PHOSPHATE 10 MG/ML IJ SOLN
INTRAMUSCULAR | Status: AC
  Filled 2016-09-18: qty 1

## 2016-09-18 MED ORDER — PALONOSETRON HCL INJECTION 0.25 MG/5ML
0.2500 mg | Freq: Once | INTRAVENOUS | Status: AC
  Administered 2016-09-18: 0.25 mg via INTRAVENOUS

## 2016-09-18 MED ORDER — DEXAMETHASONE SODIUM PHOSPHATE 10 MG/ML IJ SOLN
10.0000 mg | Freq: Once | INTRAMUSCULAR | Status: AC
  Administered 2016-09-18: 10 mg via INTRAVENOUS

## 2016-09-18 MED ORDER — HEPARIN SOD (PORK) LOCK FLUSH 100 UNIT/ML IV SOLN
500.0000 [IU] | Freq: Once | INTRAVENOUS | Status: AC | PRN
  Administered 2016-09-18: 500 [IU]
  Filled 2016-09-18: qty 5

## 2016-09-18 MED ORDER — PALONOSETRON HCL INJECTION 0.25 MG/5ML
INTRAVENOUS | Status: AC
  Filled 2016-09-18: qty 5

## 2016-09-18 MED ORDER — SODIUM CHLORIDE 0.9 % IV SOLN
500.0000 mg/m2 | Freq: Once | INTRAVENOUS | Status: AC
  Administered 2016-09-18: 912 mg via INTRAVENOUS
  Filled 2016-09-18: qty 23.99

## 2016-09-18 MED ORDER — SODIUM CHLORIDE 0.9 % IJ SOLN
10.0000 mL | INTRAMUSCULAR | Status: DC | PRN
  Filled 2016-09-18: qty 10

## 2016-09-18 MED ORDER — SODIUM CHLORIDE 0.9 % IV SOLN: 134.5600 mg | Freq: Once | INTRAVENOUS | Status: DC

## 2016-09-18 MED ORDER — CARBOPLATIN CHEMO INJECTION 450 MG/45ML
130.0000 mg | Freq: Once | INTRAVENOUS | Status: AC
  Administered 2016-09-18: 130 mg via INTRAVENOUS
  Filled 2016-09-18: qty 13

## 2016-09-18 MED ORDER — SODIUM CHLORIDE 0.9% FLUSH
10.0000 mL | INTRAVENOUS | Status: DC | PRN
  Administered 2016-09-18: 10 mL
  Filled 2016-09-18: qty 10

## 2016-09-18 MED ORDER — PEGFILGRASTIM 6 MG/0.6ML ~~LOC~~ PSKT
6.0000 mg | PREFILLED_SYRINGE | Freq: Once | SUBCUTANEOUS | Status: AC
  Administered 2016-09-18: 6 mg via SUBCUTANEOUS
  Filled 2016-09-18: qty 0.6

## 2016-09-18 MED ORDER — CARBOPLATIN CHEMO INTRADERMAL TEST DOSE 100MCG/0.02ML
100.0000 ug | Freq: Once | INTRADERMAL | Status: AC
  Administered 2016-09-18: 100 ug via INTRADERMAL
  Filled 2016-09-18: qty 0.02

## 2016-09-18 NOTE — Telephone Encounter (Signed)
Scheduled appt per 5/4 los. Patient was called in the treatment area before she receive schedule. Patient to receive schedule in the treatment area.

## 2016-09-18 NOTE — Patient Instructions (Signed)

## 2016-09-18 NOTE — Patient Instructions (Signed)
[facility] Discharge Instructions for Patients Receiving Chemotherapy  Today you received the following chemotherapy agents: gemcitabine (Gemzar) and carboplatin (Paraplatin).  To help prevent nausea and vomiting after your treatment, we encourage you to take your nausea medication as directed.    If you develop nausea and vomiting that is not controlled by your nausea medication, call the clinic.   BELOW ARE SYMPTOMS THAT SHOULD BE REPORTED IMMEDIATELY:  *FEVER GREATER THAN 100.5 F  *CHILLS WITH OR WITHOUT FEVER  NAUSEA AND VOMITING THAT IS NOT CONTROLLED WITH YOUR NAUSEA MEDICATION  *UNUSUAL SHORTNESS OF BREATH  *UNUSUAL BRUISING OR BLEEDING  TENDERNESS IN MOUTH AND THROAT WITH OR WITHOUT PRESENCE OF ULCERS  *URINARY PROBLEMS  *BOWEL PROBLEMS  UNUSUAL RASH Items with * indicate a potential emergency and should be followed up as soon as possible.  Feel free to call the clinic you have any questions or concerns. The clinic phone number is [phone].  Please show the CHEMO ALERT CARD at check-in to the Emergency Department and triage nurse.   

## 2016-09-20 ENCOUNTER — Encounter: Payer: Self-pay | Admitting: Hematology and Oncology

## 2016-09-20 NOTE — Assessment & Plan Note (Signed)
I spent some time counseling the patient the importance of tobacco cessation. She is currently not interested to quit now. 

## 2016-09-20 NOTE — Assessment & Plan Note (Signed)
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 She is prescribed gabapentin. We will monitor carefully Reduce dose chemo as above 

## 2016-09-20 NOTE — Assessment & Plan Note (Signed)
She tolerated treatment well without major side effects. We will proceed with more treatment. I plan to see her back next month for further supportive care. Due to history recent neutropenia and infection, she will get G-CSF support after day 8 of treatment every cycle I plan to repeat imaging study soon to assess response to treatment

## 2016-09-20 NOTE — Assessment & Plan Note (Signed)
Pancytopenia is due to chemo I have requested addition of Neulasta in addition to dose reduction She is not symptomatic from anemia or thrombocytopenia. 

## 2016-09-20 NOTE — Progress Notes (Signed)
[place] OFFICE PROGRESS NOTE  Patient Care Team: McDiarmid, Blane Ohara, MD as PCP - General (Family Medicine) Gaye Pollack, MD (Cardiothoracic Surgery) Dickie La, MD (Family Medicine) Heath Lark, MD as Consulting Physician (Hematology and Oncology) Pleasant, Eppie Gibson, RN as [place], [place], [place] (Optometry) Milus Banister, MD as Consulting Physician (Gastroenterology) Melida Quitter, MD as Consulting Physician (Otolaryngology)  SUMMARY OF ONCOLOGIC HISTORY: Oncology History   Colon cancer   Primary site: Colon and Rectum (Left)   Staging method: AJCC 7th Edition   Clinical: Stage I (T2, N0, M0) signed by Heath Lark, MD on 05/31/2013  2:39 PM   Pathologic: Stage I (T2, N0, cM0) signed by Heath Lark, MD on 05/31/2013  2:39 PM   Summary: Stage I (T2, N0, cM0) Lung cancer, EGFR/ALK negative, recurrence after initial resection to LN and brain   Primary site: Lung (Left)   Staging method: AJCC 7th Edition   Clinical: Stage IV (T1, N2, M1b) signed by Heath Lark, MD on 05/31/2013  2:26 PM   Pathologic: Stage IV (T1, N2, M1b) signed by Heath Lark, MD on 05/31/2013  2:26 PM   Summary: Stage IV (T1, N2, M1b)       Cancer of upper lobe of left lung, Adenocarcinoma   03/01/2007 Procedure    Colonoscopy revealed abnormalities and biopsy show high-grade dysplasia      04/01/2007 Surgery    She underwent sigmoid resection which showed T2 N0 colon cancer, and negative margins and all of 17 lymph nodes were negative      02/29/2008 Procedure    Repeat surveillance colonoscopy was negative.      06/16/2010 Surgery    She underwent left upper lobectomy we show well-differentiated adenocarcinoma of the lung, T1, N0, M0      03/18/2011 Procedure    Repeat colonoscopy show multiple polyps but there were benign      10/19/2011 Procedure    Biopsy of mediastinal lymph node came back positive for recurrence of lung cancer, EGFR and ALK  negative      11/16/2011 - 12/14/2011 Chemotherapy    She received concurrent chemoradiation therapy with weekly carboplatin and Taxol.      11/16/2011 - 12/29/2011 Radiation Therapy    She received radiation therapy with weekly chemotherapy      02/15/2012 Imaging    MR of the brain showed a new intracranial metastases. This was subsequently treated with stereotactic radiosurgery.      03/07/2012 - 04/12/2013 Chemotherapy    She received chemotherapy with maintainence Alimta every 3 weeks. Chemotherapy was discontinued due to profound fatigue      03/02/2013 Procedure    She had therapeutic ultrasound guidance thoracentesis for pleural effusion that came back negative for cancer      05/04/2013 Procedure    She had repeat ultrasound-guided thoracentesis again and cytology was negative      05/29/2013 Imaging    Repeat CT scan of the chest, abdomen and pelvis show no evidence of disease but persistent right-sided pleural effusion      06/02/2013 Surgery    The patient had placement of Pleurx catheter and subsequently underwent pleurodesis.      09/22/2013 Imaging    Repeat CT scan show no evidence of active disease. There are nonspecific lymphadenopathy and she is placed on observation.      03/23/2014 Imaging    Repeat CT scan of the chest, abdomen and pelvis show recurrence of cancer  with widespread bilateral pulmonary metastasis.      05/14/2014 Imaging    Imaging of the chest and brain were repeated due to delay of initiation of chemotherapy. Overall, chest CT scan show stable disease. MRI of the head was negative for recurrence      05/16/2014 - 07/18/2014 Chemotherapy     she completed 4 cycles of combination chemotherapy with carboplatin and Alimta      07/16/2014 Imaging     repeat CT scan of the chest, abdomen and pelvis show regression in the size of lung nodules.      08/29/2014 - 08/14/2015 Chemotherapy    She received maintenance Alimta      10/16/2014 Imaging      repeat CT scan of the chest, abdomen and pelvis show regression in the size of lung nodules.      01/29/2015 Imaging    Repeat CT scan showed stable disease      02/01/2015 Imaging    MRI brain is negative      04/30/2015 Imaging    CT scan of the chest, abdomen and pelvis showed stable disease      07/25/2015 Imaging    MRI brain showed no evidence of new disease      09/09/2015 Imaging    CT chest showed interval increase in size of large RIGHT upper lobe nodule is most consistent with lung cancer recurrence.      09/18/2015 - 03/26/2016 Chemotherapy    She started on Nivolumab      10/22/2015 Imaging    Screening mammogram showed mild abnormality      10/31/2015 Imaging    Diagnostic mammogram showed mild calcification at the right upper outer breast      11/27/2015 Imaging    Stable post treatment related changes of left lower lobectomy and radiation therapy redemonstrated, as above. No definite findings to suggest local recurrence of disease or metastatic disease on today's examination      01/24/2016 Imaging    MR brain showed continued stable appearance of two small treated brain metastases. No new or progressive metastatic disease to the brain.      04/15/2016 Imaging    Ct scan showed findings highly suspicious for recurrent tumor in the mediastinum surrounding the right mainstem bronchus and in the region of the azygoesophageal recess. Diffuse pulmonary metastatic disease with new and progressive nodules since the prior examination.      04/24/2016 -  Chemotherapy    The patient had chemotherapy with gemzar and Carboplatin      04/30/2016 Imaging    Decreased contrast enhancement of the two treated metastases in the right frontal and left occipital lobes. No new lesions.      06/01/2016 Imaging    CXR showed numerous metastatic nodules have progressed compared to the most recent comparison chest x-ray of 07/15/2015. Majority of these were noted on the 04/15/2016  chest CT. No segmental infiltrate or pulmonary edema.      06/19/2016 Adverse Reaction    Treatment is placed on hold today due to neutropenia and the patient not feeling well      06/29/2016 Imaging    Interval decrease in size of metastatic pulmonary nodules. 2. Persistent but slightly improved right hilar/mediastinal disease. No progressive findings. 3. No findings for upper abdominal metastatic disease       Brain metastases treated with radiosurgery   02/26/2012 Initial Diagnosis    Brain metastases treated with radiosurgery       INTERVAL HISTORY:  Please see below for problem oriented charting. She returns for further follow-up and chemotherapy She tolerated treatment well Denies recent infection Cough has improved She continues to smoke Worsening peripheral neuropathy She has some mild fatigue. The patient denies any recent signs or symptoms of bleeding such as spontaneous epistaxis, hematuria or hematochezia. She denies recent nausea or vomiting She has chronic bilateral lower extremity edema, stable  REVIEW OF SYSTEMS:   Constitutional: Denies fevers, chills or abnormal weight loss Eyes: Denies blurriness of vision Ears, nose, mouth, throat, and face: Denies mucositis or sore throat Respiratory: Denies cough, dyspnea or wheezes Gastrointestinal:  Denies nausea, heartburn or change in bowel habits Skin: Denies abnormal skin rashes Lymphatics: Denies new lymphadenopathy or easy bruising Neurological:Denies numbness, tingling or new weaknesses Behavioral/Psych: Mood is stable, no new changes  All other systems were reviewed with the patient and are negative.  I have reviewed the past medical history, past surgical history, social history and family history with the patient and they are unchanged from previous note.  ALLERGIES:  is allergic to adhesive [tape] and lisinopril.  MEDICATIONS:  Current Outpatient Prescriptions  Medication Sig Dispense Refill  . aspirin  81 MG EC tablet Take 81 mg by mouth daily.     Marland Kitchen BYSTOLIC 10 MG tablet Take 10 mg by mouth daily.     . cholecalciferol (VITAMIN D) 1000 units tablet Take 1,000 Units by mouth daily.     Marland Kitchen COLACE 100 MG capsule Take 100 mg by mouth daily as needed for mild constipation.     Marland Kitchen HYDROmorphone (DILAUDID) 8 MG tablet Take 1 tablet (8 mg total) by mouth every 6 (six) hours as needed for severe pain. 60 tablet 0  . ipratropium (ATROVENT HFA) 17 MCG/ACT inhaler Inhale 2 puffs into the lungs every 4 (four) hours as needed for wheezing (Cough). 3 Inhaler 3  . lidocaine-prilocaine (EMLA) cream Apply topically as needed. Apply to porta cath site one hour prior to needle stick. 30 g 6  . loratadine (CLARITIN) 10 MG tablet Take 10 mg by mouth daily.    Marland Kitchen omeprazole (PRILOSEC) 40 MG capsule Take 40 mg by mouth daily.     . pravastatin (PRAVACHOL) 40 MG tablet Take 40 mg by mouth daily.     Marland Kitchen umeclidinium-vilanterol (ANORO ELLIPTA) 62.5-25 MCG/INH AEPB Inhale 1 puff into the lungs daily. 3 each 3  . VENTOLIN HFA 108 (90 Base) MCG/ACT inhaler Inhale 2 puffs into the lungs every 4 (four) hours as needed for wheezing or shortness of breath. 3 Inhaler 3  . gabapentin (NEURONTIN) 300 MG capsule     . nitroGLYCERIN (NITROSTAT) 0.4 MG SL tablet Place 1 tablet (0.4 mg total) under the tongue every 5 (five) minutes as needed. For chest pain (Patient not taking: Reported on 09/18/2016) 25 tablet 5  . ondansetron (ZOFRAN ODT) 4 MG disintegrating tablet Take 1 tablet (4 mg total) by mouth every 8 (eight) hours as needed for nausea or vomiting. (Patient not taking: Reported on 09/18/2016) 20 tablet 0  . predniSONE (DELTASONE) 10 MG tablet Take 1 tablet (10 mg total) by mouth daily with breakfast. (Patient not taking: Reported on 09/09/2016) 30 tablet 1   No current facility-administered medications for this visit.    Facility-Administered Medications Ordered in Other Visits  Medication Dose Route Frequency Provider Last Rate Last  Dose  . sodium chloride 0.9 % injection 10 mL  10 mL Intravenous PRN Alvy Bimler, Saleem Coccia, MD   10 mL at 09/18/15 1345  .  sodium chloride 0.9 % injection 10 mL  10 mL Intravenous PRN Alvy Bimler, Nichalas Coin, MD   10 mL at 04/24/16 1136    PHYSICAL EXAMINATION: ECOG PERFORMANCE STATUS: 1 - Symptomatic but completely ambulatory  Vitals:   09/18/16 1032  BP: (!) 156/68  Pulse: 82  Resp: 18  Temp: 98.3 F (36.8 C)   Filed Weights   09/18/16 1032  Weight: 161 lb 14.4 oz (73.4 kg)    GENERAL:alert, no distress and comfortable SKIN: skin color, texture, turgor are normal, no rashes or significant lesions EYES: normal, Conjunctiva are pink and non-injected, sclera clear OROPHARYNX:no exudate, no erythema and lips, buccal mucosa, and tongue normal  NECK: supple, thyroid normal size, non-tender, without nodularity LYMPH:  no palpable lymphadenopathy in the cervical, axillary or inguinal LUNGS: clear to auscultation and percussion with normal breathing effort HEART: regular rate & rhythm and no murmurs with moderate bilateral lower extremity edema ABDOMEN:abdomen soft, non-tender and normal bowel sounds Musculoskeletal:no cyanosis of digits and no clubbing  NEURO: alert & oriented x 3 with fluent speech, no focal motor/sensory deficits  LABORATORY DATA:  I have reviewed the data as listed    Component Value Date/Time   NA 143 09/18/2016 0946   K 3.8 09/18/2016 0946   CL 109 07/25/2016 0939   CL 102 10/24/2012 1001   CO2 25 09/18/2016 0946   GLUCOSE 89 09/18/2016 0946   GLUCOSE 111 (H) 10/24/2012 1001   BUN 9.2 09/18/2016 0946   CREATININE 1.1 09/18/2016 0946   CALCIUM 8.9 09/18/2016 0946   PROT 6.6 09/18/2016 0946   ALBUMIN 3.1 (L) 09/18/2016 0946   AST 11 09/18/2016 0946   ALT 9 09/18/2016 0946   ALKPHOS 127 09/18/2016 0946   BILITOT 0.37 09/18/2016 0946   GFRNONAA 58 (L) 07/25/2016 0939   GFRAA >60 07/25/2016 0939    No results found for: SPEP, UPEP  Lab Results  Component Value Date    WBC 3.4 (L) 09/18/2016   NEUTROABS 2.2 09/18/2016   HGB 9.5 (L) 09/18/2016   HCT 29.8 (L) 09/18/2016   MCV 85.3 09/18/2016   PLT 202 09/18/2016      Chemistry      Component Value Date/Time   NA 143 09/18/2016 0946   K 3.8 09/18/2016 0946   CL 109 07/25/2016 0939   CL 102 10/24/2012 1001   CO2 25 09/18/2016 0946   BUN 9.2 09/18/2016 0946   CREATININE 1.1 09/18/2016 0946      Component Value Date/Time   CALCIUM 8.9 09/18/2016 0946   ALKPHOS 127 09/18/2016 0946   AST 11 09/18/2016 0946   ALT 9 09/18/2016 0946   BILITOT 0.37 09/18/2016 0946       RADIOGRAPHIC STUDIES: I have personally reviewed the radiological images as listed and agreed with the findings in the report. Dg Chest 2 View  Result Date: 08/29/2016 CLINICAL DATA:  Short of breath and chest pain today. Chronic cough. Lung cancer. Hypertension. COPD. EXAM: CHEST  2 VIEW COMPARISON:  06/29/2016 CT.  Plain film 06/01/2016. FINDINGS: A right-sided Port-A-Cath terminates at the superior caval/ atrial junction. Midline trachea. Normal heart size. Right perihilar and paratracheal soft tissue fullness is similar and likely corresponds to radiation change on the prior CT. A small right pleural effusion persists. No pneumothorax. Mild bibasilar atelectasis is not significantly changed. No new areas of consolidation. Cough IMPRESSION: Similar appearance of the chest compared to 06/01/2016. Small right pleural effusion with bibasilar airspace disease/ atelectasis. Electronically Signed   By: Marylyn Ishihara  Jobe Igo M.D.   On: 08/29/2016 20:24    ASSESSMENT & PLAN:  Cancer of upper lobe of left lung, Adenocarcinoma She tolerated treatment well without major side effects. We will proceed with more treatment. I plan to see her back next month for further supportive care. Due to history recent neutropenia and infection, she will get G-CSF support after day 8 of treatment every cycle I plan to repeat imaging study soon to assess response to  treatment  Brain metastases treated with radiosurgery Her most recent MRI in December 2017 showed no evidence of disease recurrence. Plan to continue surveillance imaging study every 3-4 months  Pancytopenia, acquired ([place]) Pancytopenia is due to chemo I have requested addition of Neulasta in addition to dose reduction She is not symptomatic from anemia or thrombocytopenia.  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 She is prescribed gabapentin. We will monitor carefully Reduce dose chemo as above  TOBACCO ABUSE I spent some time counseling the patient the importance of tobacco cessation. She is currently not interested to quit now.   Orders Placed This Encounter  Procedures  . CT CHEST W CONTRAST    Standing Status:   Future    Standing Expiration Date:   11/18/2017    Order Specific Question:   Reason for Exam (SYMPTOM  OR DIAGNOSIS REQUIRED)    Answer:   lung cancer, staging, assess response to Rx    Order Specific Question:   Preferred imaging location?    Answer:   [facility]   All questions were answered. The patient knows to call the clinic with any problems, questions or concerns. No barriers to learning was detected. I spent 25 minutes counseling the patient face to face. The total time spent in the appointment was 30 minutes and more than 50% was on counseling and review of test results     Heath Lark, MD 09/20/2016 7:14 AM

## 2016-09-20 NOTE — Assessment & Plan Note (Signed)
Her most recent MRI in December 2017 showed no evidence of disease recurrence. Plan to continue surveillance imaging study every 3-4 months

## 2016-09-30 ENCOUNTER — Other Ambulatory Visit: Payer: Self-pay | Admitting: *Deleted

## 2016-09-30 ENCOUNTER — Encounter: Payer: Self-pay | Admitting: *Deleted

## 2016-09-30 NOTE — Patient Outreach (Signed)
[name]) Care Management  09/30/2016   [facility] [name] January 24, 1951 [id number]  RN Health Coach telephone call to patient.  [employer] compliance verified. Per patient she is short of breath on exertion. Patient stated she is using her inhalers and that is helping a lot. Per patient she is having weakness and tingling in feet. Patient was walking and stated her legs gave out and she fell. Patient stated she didn't get injured. Per patient she is noticing more swelling in legs at night. RN discussed with patient about making physician aware of changes . Patient is still receiving chemotherapy. Patient is still currently smoking. Per patient her appetite is fair. She is drinking plenty of fluids. Per patient she is having some car problems and was having some difficulty getting to her appointments. Patient went ahead and started using the scat services. Patient has agreed to follow up outreach calls.  Objective:   Current Medications:  Current Outpatient Prescriptions  Medication Sig Dispense Refill  . aspirin 81 MG EC tablet Take 81 mg by mouth daily.     [name] Kitchen BYSTOLIC 10 MG tablet Take 10 mg by mouth daily.     . cholecalciferol (VITAMIN D) 1000 units tablet Take 1,000 Units by mouth daily.     [name] Kitchen COLACE 100 MG capsule Take 100 mg by mouth daily as needed for mild constipation.     . gabapentin (NEURONTIN) 300 MG capsule     . HYDROmorphone (DILAUDID) 8 MG tablet Take 1 tablet (8 mg total) by mouth every 6 (six) hours as needed for severe pain. 60 tablet 0  . ipratropium (ATROVENT HFA) 17 MCG/ACT inhaler Inhale 2 puffs into the lungs every 4 (four) hours as needed for wheezing (Cough). 3 Inhaler 3  . lidocaine-prilocaine (EMLA) cream Apply topically as needed. Apply to porta cath site one hour prior to needle stick. 30 g 6  . loratadine (CLARITIN) 10 MG tablet Take 10 mg by mouth daily.    . nitroGLYCERIN (NITROSTAT) 0.4 MG SL tablet Place 1 tablet (0.4 mg total) under the tongue  every 5 (five) minutes as needed. For chest pain (Patient not taking: Reported on 09/18/2016) 25 tablet 5  . omeprazole (PRILOSEC) 40 MG capsule Take 40 mg by mouth daily.     . ondansetron (ZOFRAN ODT) 4 MG disintegrating tablet Take 1 tablet (4 mg total) by mouth every 8 (eight) hours as needed for nausea or vomiting. (Patient not taking: Reported on 09/18/2016) 20 tablet 0  . pravastatin (PRAVACHOL) 40 MG tablet Take 40 mg by mouth daily.     . predniSONE (DELTASONE) 10 MG tablet Take 1 tablet (10 mg total) by mouth daily with breakfast. (Patient not taking: Reported on 09/09/2016) 30 tablet 1  . umeclidinium-vilanterol (ANORO ELLIPTA) 62.5-25 MCG/INH AEPB Inhale 1 puff into the lungs daily. 3 each 3  . VENTOLIN HFA 108 (90 Base) MCG/ACT inhaler Inhale 2 puffs into the lungs every 4 (four) hours as needed for wheezing or shortness of breath. 3 Inhaler 3   No current facility-administered medications for this visit.    Facility-Administered Medications Ordered in Other Visits  Medication Dose Route Frequency Provider Last Rate Last Dose  . sodium chloride 0.9 % injection 10 mL  10 mL Intravenous PRN Alvy Bimler, Ni, MD   10 mL at 09/18/15 1345  . sodium chloride 0.9 % injection 10 mL  10 mL Intravenous PRN Heath Lark, MD   10 mL at 04/24/16 1136    Functional Status:  In your present state of health, do you have any difficulty performing the following activities: 09/30/2016 08/31/2016  Hearing? [name]  Vision? Y Y  Difficulty concentrating or making decisions? [name]  Walking or climbing stairs? Y Y  Dressing or bathing? N N  Doing errands, shopping? [name]  Preparing Food and eating ? N N  Using the Toilet? N N  In the past six months, have you accidently leaked urine? Y Y  Do you have problems with loss of bowel control? N N  Managing your Medications? N N  Managing your Finances? N N  Housekeeping or managing your Housekeeping? Y Y  Some recent data might be hidden    Fall/Depression  Screening: Fall Risk  09/30/2016 08/31/2016 08/13/2016  Falls in the past year? Yes Yes Yes  Number falls in past yr: 2 or more 2 or more 2 or more  Injury with Fall? No No No  Risk Factor Category  High Fall Risk High Fall Risk High Fall Risk  Risk for fall due to : History of fall(s);Impaired balance/gait History of fall(s);Impaired balance/gait;Other (Comment) History of fall(s);Impaired balance/gait;Impaired mobility  Risk for fall due to (comments): - - -  Follow up Falls evaluation completed;Education provided;Falls prevention discussed Falls evaluation completed;Education provided -   [facility] 2/9 Scores 09/30/2016 08/31/2016 08/13/2016 07/31/2016 07/03/2016 06/25/2016 06/02/2016  PHQ - 2 Score 1 1 0 0 0 1 0  PHQ- 9 Score - - - - - - -  Exception Documentation - - - - - - -   [facility] CM Care Plan Problem One     Most Recent Value  Care Plan Problem One  knowledge deficit in self management of COPD  Role Documenting the Problem One  [place] for Problem One  Active  [facility] Long Term Goal (31-90 days)  Patient will not have any readmissions for COPD within the next 90 days  [facility] Long Term Goal Start Date  09/30/16  Interventions for Problem One Long Term Goal  RN reminded patient to keep appointments with PCP and oncology and pulmonary specialist. RN reminded patient to take medications as per order.  [facility] CM Short Term Goal #1 (0-30 days)  Patient will report better nutritional intake within the next 30 days  [facility] CM Short Term Goal #1 Start Date  09/30/16  Interventions for Short Term Goal #1  RN discussed with patient about eating small meals and snacks. RN discussed with patient about drinking more nutritional supplements. RN will send ensure coupons to offsetthe cost  [facility] CM Short Term Goal #5 (0-30 days)  Patient will verbalize witin the next 30 days that she is getting in into a routine exercise program  [facility] CM Short Term Goal #5 Start Date  09/30/16  Interventions for Short Term Goal #5   RN sent patient a list of chair exercises with pictures. RN discussed with patient the importance of building up her strength. RN discusses with patient about taking short walks       Assessment:  Patient is having numbness and tingling in lower extremities Patient has fallen since last outreach Patient continues to need support   Plan:  RN will send patient services provided by [employer] RN will send patient educational material on neuropathy RN will follow up outreach in the month of June  [place] [facility] [phone]

## 2016-10-01 ENCOUNTER — Encounter (HOSPITAL_COMMUNITY): Payer: Self-pay

## 2016-10-01 ENCOUNTER — Ambulatory Visit (HOSPITAL_BASED_OUTPATIENT_CLINIC_OR_DEPARTMENT_OTHER): Payer: Medicare HMO

## 2016-10-01 ENCOUNTER — Ambulatory Visit (HOSPITAL_COMMUNITY)
Admission: RE | Admit: 2016-10-01 | Discharge: 2016-10-01 | Disposition: A | Discharge status: Home / Self Care | Payer: Medicare HMO | Source: Ambulatory Visit | Attending: Hematology and Oncology | Admitting: Hematology and Oncology

## 2016-10-01 ENCOUNTER — Other Ambulatory Visit (HOSPITAL_BASED_OUTPATIENT_CLINIC_OR_DEPARTMENT_OTHER): Payer: Medicare HMO

## 2016-10-01 VITALS — BP 165/66 | HR 87 | Temp 98.5°F | Resp 18

## 2016-10-01 DIAGNOSIS — I2510 Atherosclerotic heart disease of native coronary artery without angina pectoris: Secondary | ICD-10-CM | POA: Diagnosis not present

## 2016-10-01 DIAGNOSIS — R590 Localized enlarged lymph nodes: Secondary | ICD-10-CM | POA: Diagnosis not present

## 2016-10-01 DIAGNOSIS — C3412 Malignant neoplasm of upper lobe, left bronchus or lung: Secondary | ICD-10-CM | POA: Insufficient documentation

## 2016-10-01 DIAGNOSIS — I700 Atherosclerosis of aorta: Secondary | ICD-10-CM | POA: Insufficient documentation

## 2016-10-01 DIAGNOSIS — C3492 Malignant neoplasm of unspecified part of left bronchus or lung: Secondary | ICD-10-CM | POA: Diagnosis not present

## 2016-10-01 DIAGNOSIS — K760 Fatty (change of) liver, not elsewhere classified: Secondary | ICD-10-CM | POA: Diagnosis not present

## 2016-10-01 DIAGNOSIS — C7931 Secondary malignant neoplasm of brain: Secondary | ICD-10-CM

## 2016-10-01 DIAGNOSIS — J439 Emphysema, unspecified: Secondary | ICD-10-CM | POA: Diagnosis not present

## 2016-10-01 DIAGNOSIS — C7949 Secondary malignant neoplasm of other parts of nervous system: Secondary | ICD-10-CM

## 2016-10-01 LAB — CBC WITH DIFFERENTIAL/PLATELET
BASO%: 0.4 % (ref 0.0–2.0)
BASOS ABS: 0.1 10*3/uL (ref 0.0–0.1)
EOS ABS: 0 10*3/uL (ref 0.0–0.5)
EOS%: 0.4 % (ref 0.0–7.0)
HEMATOCRIT: 31.8 % — AB (ref 34.8–46.6)
HEMOGLOBIN: 10.1 g/dL — AB (ref 11.6–15.9)
LYMPH#: 0.8 10*3/uL — AB (ref 0.9–3.3)
LYMPH%: 6.4 % — ABNORMAL LOW (ref 14.0–49.7)
MCH: 27.1 pg (ref 25.1–34.0)
MCHC: 31.7 g/dL (ref 31.5–36.0)
MCV: 85.6 fL (ref 79.5–101.0)
MONO#: 1 10*3/uL — ABNORMAL HIGH (ref 0.1–0.9)
MONO%: 8.3 % (ref 0.0–14.0)
NEUT%: 84.5 % — ABNORMAL HIGH (ref 38.4–76.8)
NEUTROS ABS: 10.2 10*3/uL — AB (ref 1.5–6.5)
Platelets: 137 10*3/uL — ABNORMAL LOW (ref 145–400)
RBC: 3.71 10*6/uL (ref 3.70–5.45)
RDW: 23.1 % — AB (ref 11.2–14.5)
WBC: 12.1 10*3/uL — AB (ref 3.9–10.3)

## 2016-10-01 LAB — COMPREHENSIVE METABOLIC PANEL
ALBUMIN: 3.1 g/dL — AB (ref 3.5–5.0)
ALK PHOS: 167 U/L — AB (ref 40–150)
ALT: 7 U/L (ref 0–55)
AST: 10 U/L (ref 5–34)
Anion Gap: 10 mEq/L (ref 3–11)
BILIRUBIN TOTAL: 0.38 mg/dL (ref 0.20–1.20)
BUN: 8.9 mg/dL (ref 7.0–26.0)
CALCIUM: 8.9 mg/dL (ref 8.4–10.4)
CO2: 25 mEq/L (ref 22–29)
Chloride: 108 mEq/L (ref 98–109)
Creatinine: 1.1 mg/dL (ref 0.6–1.1)
EGFR: 62 mL/min/{1.73_m2} — AB (ref >90)
GLUCOSE: 97 mg/dL (ref 70–140)
Potassium: 3.9 mEq/L (ref 3.5–5.1)
Sodium: 143 mEq/L (ref 136–145)
TOTAL PROTEIN: 6.7 g/dL (ref 6.4–8.3)

## 2016-10-01 MED ORDER — HEPARIN SOD (PORK) LOCK FLUSH 100 UNIT/ML IV SOLN
INTRAVENOUS | Status: AC
Start: 2016-10-01 — End: 2016-10-01
  Administered 2016-10-01: 500 [IU]
  Filled 2016-10-01: qty 5

## 2016-10-01 MED ORDER — IOPAMIDOL (ISOVUE-300) INJECTION 61%
INTRAVENOUS | Status: AC
  Administered 2016-10-01: 75 mL
  Filled 2016-10-01: qty 75

## 2016-10-01 MED ORDER — SODIUM CHLORIDE 0.9 % IJ SOLN
10.0000 mL | INTRAMUSCULAR | Status: DC | PRN
  Administered 2016-10-01: 10 mL via INTRAVENOUS
  Filled 2016-10-01: qty 10

## 2016-10-01 NOTE — Patient Instructions (Signed)
Implanted Port Home Guide An implanted port is a type of central line that is placed under the skin. Central lines are used to provide IV access when treatment or nutrition needs to be given through a person's veins. Implanted ports are used for long-term IV access. An implanted port may be placed because:  You need IV medicine that would be irritating to the small veins in your hands or arms.  You need long-term IV medicines, such as antibiotics.  You need IV nutrition for a long period.  You need frequent blood draws for lab tests.  You need dialysis.  Implanted ports are usually placed in the chest area, but they can also be placed in the upper arm, the abdomen, or the leg. An implanted port has two main parts:  Reservoir. The reservoir is round and will appear as a small, raised area under your skin. The reservoir is the part where a needle is inserted to give medicines or draw blood.  Catheter. The catheter is a thin, flexible tube that extends from the reservoir. The catheter is placed into a large vein. Medicine that is inserted into the reservoir goes into the catheter and then into the vein.  How will I care for my incision site? Do not get the incision site wet. Bathe or shower as directed by your health care provider. How is my port accessed? Special steps must be taken to access the port:  Before the port is accessed, a numbing cream can be placed on the skin. This helps numb the skin over the port site.  Your health care provider uses a sterile technique to access the port. ? Your health care provider must put on a mask and sterile gloves. ? The skin over your port is cleaned carefully with an antiseptic and allowed to dry. ? The port is gently pinched between sterile gloves, and a needle is inserted into the port.  Only "non-coring" port needles should be used to access the port. Once the port is accessed, a blood return should be checked. This helps ensure that the port  is in the vein and is not clogged.  If your port needs to remain accessed for a constant infusion, a clear (transparent) bandage will be placed over the needle site. The bandage and needle will need to be changed every week, or as directed by your health care provider.  Keep the bandage covering the needle clean and dry. Do not get it wet. Follow your health care provider's instructions on how to take a shower or bath while the port is accessed.  If your port does not need to stay accessed, no bandage is needed over the port.  What is flushing? Flushing helps keep the port from getting clogged. Follow your health care provider's instructions on how and when to flush the port. Ports are usually flushed with saline solution or a medicine called heparin. The need for flushing will depend on how the port is used.  If the port is used for intermittent medicines or blood draws, the port will need to be flushed: ? After medicines have been given. ? After blood has been drawn. ? As part of routine maintenance.  If a constant infusion is running, the port may not need to be flushed.  How long will my port stay implanted? The port can stay in for as long as your health care provider thinks it is needed. When it is time for the port to come out, surgery will be   done to remove it. The procedure is similar to the one performed when the port was put in. When should I seek immediate medical care? When you have an implanted port, you should seek immediate medical care if:  You notice a bad smell coming from the incision site.  You have swelling, redness, or drainage at the incision site.  You have more swelling or pain at the port site or the surrounding area.  You have a fever that is not controlled with medicine.  This information is not intended to replace advice given to you by your health care provider. Make sure you discuss any questions you have with your health care provider. Document  Released: 05/04/2005 Document Revised: 10/10/2015 Document Reviewed: 01/09/2013 Elsevier Interactive Patient Education  2017 Elsevier Inc.  

## 2016-10-02 ENCOUNTER — Ambulatory Visit (HOSPITAL_BASED_OUTPATIENT_CLINIC_OR_DEPARTMENT_OTHER): Payer: Medicare HMO

## 2016-10-02 ENCOUNTER — Ambulatory Visit (HOSPITAL_BASED_OUTPATIENT_CLINIC_OR_DEPARTMENT_OTHER): Payer: Medicare HMO | Admitting: Hematology and Oncology

## 2016-10-02 ENCOUNTER — Encounter: Payer: Self-pay | Admitting: Hematology and Oncology

## 2016-10-02 DIAGNOSIS — C7931 Secondary malignant neoplasm of brain: Secondary | ICD-10-CM

## 2016-10-02 DIAGNOSIS — E440 Moderate protein-calorie malnutrition: Secondary | ICD-10-CM | POA: Diagnosis not present

## 2016-10-02 DIAGNOSIS — Z720 Tobacco use: Secondary | ICD-10-CM

## 2016-10-02 DIAGNOSIS — C3412 Malignant neoplasm of upper lobe, left bronchus or lung: Secondary | ICD-10-CM | POA: Diagnosis not present

## 2016-10-02 DIAGNOSIS — Z5111 Encounter for antineoplastic chemotherapy: Secondary | ICD-10-CM

## 2016-10-02 DIAGNOSIS — D61818 Other pancytopenia: Secondary | ICD-10-CM

## 2016-10-02 DIAGNOSIS — C7949 Secondary malignant neoplasm of other parts of nervous system: Secondary | ICD-10-CM | POA: Diagnosis not present

## 2016-10-02 DIAGNOSIS — F17200 Nicotine dependence, unspecified, uncomplicated: Secondary | ICD-10-CM

## 2016-10-02 MED ORDER — CARBOPLATIN CHEMO INTRADERMAL TEST DOSE 100MCG/0.02ML
100.0000 ug | Freq: Once | INTRADERMAL | Status: AC
  Administered 2016-10-02: 100 ug via INTRADERMAL
  Filled 2016-10-02: qty 0.02

## 2016-10-02 MED ORDER — SODIUM CHLORIDE 0.9 % IV SOLN
500.0000 mg/m2 | Freq: Once | INTRAVENOUS | Status: AC
  Administered 2016-10-02: 912 mg via INTRAVENOUS
  Filled 2016-10-02: qty 23.99

## 2016-10-02 MED ORDER — SODIUM CHLORIDE 0.9% FLUSH
10.0000 mL | INTRAVENOUS | Status: DC | PRN
  Administered 2016-10-02: 10 mL
  Filled 2016-10-02: qty 10

## 2016-10-02 MED ORDER — SODIUM CHLORIDE 0.9 % IV SOLN
Freq: Once | INTRAVENOUS | Status: AC
  Administered 2016-10-02: 12:00:00 via INTRAVENOUS

## 2016-10-02 MED ORDER — SODIUM CHLORIDE 0.9 % IV SOLN
134.5600 mg | Freq: Once | INTRAVENOUS | Status: AC
  Administered 2016-10-02: 130 mg via INTRAVENOUS
  Filled 2016-10-02: qty 13

## 2016-10-02 MED ORDER — PALONOSETRON HCL INJECTION 0.25 MG/5ML
INTRAVENOUS | Status: AC
Start: 2016-10-02 — End: 2016-10-02
  Filled 2016-10-02: qty 5

## 2016-10-02 MED ORDER — HEPARIN SOD (PORK) LOCK FLUSH 100 UNIT/ML IV SOLN
500.0000 [IU] | Freq: Once | INTRAVENOUS | Status: AC | PRN
  Administered 2016-10-02: 500 [IU]
  Filled 2016-10-02: qty 5

## 2016-10-02 MED ORDER — DEXAMETHASONE SODIUM PHOSPHATE 10 MG/ML IJ SOLN
10.0000 mg | Freq: Once | INTRAMUSCULAR | Status: AC
  Administered 2016-10-02: 10 mg via INTRAVENOUS

## 2016-10-02 MED ORDER — DEXAMETHASONE SODIUM PHOSPHATE 10 MG/ML IJ SOLN
INTRAMUSCULAR | Status: AC
  Filled 2016-10-02: qty 1

## 2016-10-02 MED ORDER — PALONOSETRON HCL INJECTION 0.25 MG/5ML
0.2500 mg | Freq: Once | INTRAVENOUS | Status: AC
  Administered 2016-10-02: 0.25 mg via INTRAVENOUS

## 2016-10-02 NOTE — Progress Notes (Signed)
[facility] OFFICE PROGRESS NOTE  Patient Care Team: McDiarmid, Blane Ohara, MD as PCP - General (Family Medicine) Gaye Pollack, MD (Cardiothoracic Surgery) Dickie La, MD (Family Medicine) Heath Lark, MD as Consulting Physician (Hematology and Oncology) Pleasant, Eppie Gibson, RN as [place], [place], [place] (Optometry) Milus Banister, MD as Consulting Physician (Gastroenterology) Melida Quitter, MD as Consulting Physician (Otolaryngology)  SUMMARY OF ONCOLOGIC HISTORY: Oncology History   Colon cancer   Primary site: Colon and Rectum (Left)   Staging method: AJCC 7th Edition   Clinical: Stage I (T2, N0, M0) signed by Heath Lark, MD on 05/31/2013  2:39 PM   Pathologic: Stage I (T2, N0, cM0) signed by Heath Lark, MD on 05/31/2013  2:39 PM   Summary: Stage I (T2, N0, cM0) Lung cancer, EGFR/ALK negative, recurrence after initial resection to LN and brain   Primary site: Lung (Left)   Staging method: AJCC 7th Edition   Clinical: Stage IV (T1, N2, M1b) signed by Heath Lark, MD on 05/31/2013  2:26 PM   Pathologic: Stage IV (T1, N2, M1b) signed by Heath Lark, MD on 05/31/2013  2:26 PM   Summary: Stage IV (T1, N2, M1b)       Cancer of upper lobe of left lung, Adenocarcinoma   03/01/2007 Procedure    Colonoscopy revealed abnormalities and biopsy show high-grade dysplasia      04/01/2007 Surgery    She underwent sigmoid resection which showed T2 N0 colon cancer, and negative margins and all of 17 lymph nodes were negative      02/29/2008 Procedure    Repeat surveillance colonoscopy was negative.      06/16/2010 Surgery    She underwent left upper lobectomy we show well-differentiated adenocarcinoma of the lung, T1, N0, M0      03/18/2011 Procedure    Repeat colonoscopy show multiple polyps but there were benign      10/19/2011 Procedure    Biopsy of mediastinal lymph node came back positive for recurrence of lung cancer, EGFR and ALK  negative      11/16/2011 - 12/14/2011 Chemotherapy    She received concurrent chemoradiation therapy with weekly carboplatin and Taxol.      11/16/2011 - 12/29/2011 Radiation Therapy    She received radiation therapy with weekly chemotherapy      02/15/2012 Imaging    MR of the brain showed a new intracranial metastases. This was subsequently treated with stereotactic radiosurgery.      03/07/2012 - 04/12/2013 Chemotherapy    She received chemotherapy with maintainence Alimta every 3 weeks. Chemotherapy was discontinued due to profound fatigue      03/02/2013 Procedure    She had therapeutic ultrasound guidance thoracentesis for pleural effusion that came back negative for cancer      05/04/2013 Procedure    She had repeat ultrasound-guided thoracentesis again and cytology was negative      05/29/2013 Imaging    Repeat CT scan of the chest, abdomen and pelvis show no evidence of disease but persistent right-sided pleural effusion      06/02/2013 Surgery    The patient had placement of Pleurx catheter and subsequently underwent pleurodesis.      09/22/2013 Imaging    Repeat CT scan show no evidence of active disease. There are nonspecific lymphadenopathy and she is placed on observation.      03/23/2014 Imaging    Repeat CT scan of the chest, abdomen and pelvis show recurrence of cancer  with widespread bilateral pulmonary metastasis.      05/14/2014 Imaging    Imaging of the chest and brain were repeated due to delay of initiation of chemotherapy. Overall, chest CT scan show stable disease. MRI of the head was negative for recurrence      05/16/2014 - 07/18/2014 Chemotherapy     she completed 4 cycles of combination chemotherapy with carboplatin and Alimta      07/16/2014 Imaging     repeat CT scan of the chest, abdomen and pelvis show regression in the size of lung nodules.      08/29/2014 - 08/14/2015 Chemotherapy    She received maintenance Alimta      10/16/2014 Imaging      repeat CT scan of the chest, abdomen and pelvis show regression in the size of lung nodules.      01/29/2015 Imaging    Repeat CT scan showed stable disease      02/01/2015 Imaging    MRI brain is negative      04/30/2015 Imaging    CT scan of the chest, abdomen and pelvis showed stable disease      07/25/2015 Imaging    MRI brain showed no evidence of new disease      09/09/2015 Imaging    CT chest showed interval increase in size of large RIGHT upper lobe nodule is most consistent with lung cancer recurrence.      09/18/2015 - 03/26/2016 Chemotherapy    She started on Nivolumab      10/22/2015 Imaging    Screening mammogram showed mild abnormality      10/31/2015 Imaging    Diagnostic mammogram showed mild calcification at the right upper outer breast      11/27/2015 Imaging    Stable post treatment related changes of left lower lobectomy and radiation therapy redemonstrated, as above. No definite findings to suggest local recurrence of disease or metastatic disease on today's examination      01/24/2016 Imaging    MR brain showed continued stable appearance of two small treated brain metastases. No new or progressive metastatic disease to the brain.      04/15/2016 Imaging    Ct scan showed findings highly suspicious for recurrent tumor in the mediastinum surrounding the right mainstem bronchus and in the region of the azygoesophageal recess. Diffuse pulmonary metastatic disease with new and progressive nodules since the prior examination.      04/24/2016 -  Chemotherapy    The patient had chemotherapy with gemzar and Carboplatin      04/30/2016 Imaging    Decreased contrast enhancement of the two treated metastases in the right frontal and left occipital lobes. No new lesions.      06/01/2016 Imaging    CXR showed numerous metastatic nodules have progressed compared to the most recent comparison chest x-ray of 07/15/2015. Majority of these were noted on the 04/15/2016  chest CT. No segmental infiltrate or pulmonary edema.      06/19/2016 Adverse Reaction    Treatment is placed on hold today due to neutropenia and the patient not feeling well      06/29/2016 Imaging    Interval decrease in size of metastatic pulmonary nodules. 2. Persistent but slightly improved right hilar/mediastinal disease. No progressive findings. 3. No findings for upper abdominal metastatic disease      10/01/2016 Imaging    Mild increase in size of 2.5 cm central right lower lobe pulmonary nodule, consistent with metastatic disease. Numerous other bilateral sub-cm pulmonary  nodules are stable.  Stable mild subcarinal and right cardiophrenic angle lymphadenopathy. Stable emphysema and right lung radiation changes. Stable hepatic steatosis. Aortic and coronary artery atherosclerosis.       Brain metastases treated with radiosurgery   02/26/2012 Initial Diagnosis    Brain metastases treated with radiosurgery       INTERVAL HISTORY: Please see below for problem oriented charting. She returns for further chemotherapy and review of test results She felt fine otherwise She have some mild upper respiratory congestion.  She continues to cough She denies neuropathy The patient denies any recent signs or symptoms of bleeding such as spontaneous epistaxis, hematuria or hematochezia.   REVIEW OF SYSTEMS:   Constitutional: Denies fevers, chills or abnormal weight loss Eyes: Denies blurriness of vision Ears, nose, mouth, throat, and face: Denies mucositis or sore throat Respiratory: Denies cough, dyspnea or wheezes Cardiovascular: Denies palpitation, chest discomfort or lower extremity swelling Gastrointestinal:  Denies nausea, heartburn or change in bowel habits Skin: Denies abnormal skin rashes Lymphatics: Denies new lymphadenopathy or easy bruising Neurological:Denies numbness, tingling or new weaknesses Behavioral/Psych: Mood is stable, no new changes  All other systems were  reviewed with the patient and are negative.  I have reviewed the past medical history, past surgical history, social history and family history with the patient and they are unchanged from previous note.  ALLERGIES:  is allergic to adhesive [tape] and lisinopril.  MEDICATIONS:  Current Outpatient Prescriptions  Medication Sig Dispense Refill  . aspirin 81 MG EC tablet Take 81 mg by mouth daily.     Marland Kitchen BYSTOLIC 10 MG tablet Take 10 mg by mouth daily.     . cholecalciferol (VITAMIN D) 1000 units tablet Take 1,000 Units by mouth daily.     Marland Kitchen COLACE 100 MG capsule Take 100 mg by mouth daily as needed for mild constipation.     . gabapentin (NEURONTIN) 300 MG capsule     . HYDROmorphone (DILAUDID) 8 MG tablet Take 1 tablet (8 mg total) by mouth every 6 (six) hours as needed for severe pain. 60 tablet 0  . ipratropium (ATROVENT HFA) 17 MCG/ACT inhaler Inhale 2 puffs into the lungs every 4 (four) hours as needed for wheezing (Cough). 3 Inhaler 3  . lidocaine-prilocaine (EMLA) cream Apply topically as needed. Apply to porta cath site one hour prior to needle stick. 30 g 6  . loratadine (CLARITIN) 10 MG tablet Take 10 mg by mouth daily.    Marland Kitchen omeprazole (PRILOSEC) 40 MG capsule Take 40 mg by mouth daily.     . pravastatin (PRAVACHOL) 40 MG tablet Take 40 mg by mouth daily.     Marland Kitchen umeclidinium-vilanterol (ANORO ELLIPTA) 62.5-25 MCG/INH AEPB Inhale 1 puff into the lungs daily. 3 each 3  . VENTOLIN HFA 108 (90 Base) MCG/ACT inhaler Inhale 2 puffs into the lungs every 4 (four) hours as needed for wheezing or shortness of breath. 3 Inhaler 3  . nitroGLYCERIN (NITROSTAT) 0.4 MG SL tablet Place 1 tablet (0.4 mg total) under the tongue every 5 (five) minutes as needed. For chest pain (Patient not taking: Reported on 09/18/2016) 25 tablet 5  . ondansetron (ZOFRAN ODT) 4 MG disintegrating tablet Take 1 tablet (4 mg total) by mouth every 8 (eight) hours as needed for nausea or vomiting. (Patient not taking: Reported  on 09/18/2016) 20 tablet 0  . predniSONE (DELTASONE) 10 MG tablet Take 1 tablet (10 mg total) by mouth daily with breakfast. (Patient not taking: Reported on 09/09/2016) 30 tablet  1   No current facility-administered medications for this visit.    Facility-Administered Medications Ordered in Other Visits  Medication Dose Route Frequency Provider Last Rate Last Dose  . 0.9 %  sodium chloride infusion   Intravenous Once Alvy Bimler, Camerin Jimenez, MD      . dexamethasone (DECADRON) injection 10 mg  10 mg Intravenous Once Alvy Bimler, Tocarra Gassen, MD      . gemcitabine (GEMZAR) 912 mg in sodium chloride 0.9 % 100 mL chemo infusion  500 mg/m2 (Treatment Plan Recorded) Intravenous Once Alvy Bimler, Jhair Witherington, MD      . heparin lock flush 100 unit/mL  500 Units Intracatheter Once PRN Alvy Bimler, Jeanita Carneiro, MD      . palonosetron (ALOXI) injection 0.25 mg  0.25 mg Intravenous Once Hannahmarie Asberry, MD      . sodium chloride 0.9 % injection 10 mL  10 mL Intravenous PRN Alvy Bimler, Oniel Meleski, MD   10 mL at 09/18/15 1345  . sodium chloride 0.9 % injection 10 mL  10 mL Intravenous PRN Alvy Bimler, Lexii Walsh, MD   10 mL at 04/24/16 1136  . sodium chloride flush (NS) 0.9 % injection 10 mL  10 mL Intracatheter PRN Alvy Bimler, Etta Gassett, MD        PHYSICAL EXAMINATION: ECOG PERFORMANCE STATUS: 1 - Symptomatic but completely ambulatory  Vitals:   10/02/16 1117  BP: (!) 153/68  Pulse: 95  Resp: 18  Temp: 98.2 F (36.8 C)   Filed Weights   10/02/16 1117  Weight: 163 lb 14.4 oz (74.3 kg)    GENERAL:alert, no distress and comfortable SKIN: skin color, texture, turgor are normal, no rashes or significant lesions EYES: normal, Conjunctiva are pink and non-injected, sclera clear OROPHARYNX:no exudate, no erythema and lips, buccal mucosa, and tongue normal  NECK: supple, thyroid normal size, non-tender, without nodularity LYMPH:  no palpable lymphadenopathy in the cervical, axillary or inguinal LUNGS: clear to auscultation and percussion with normal breathing effort HEART: regular rate  & rhythm and no murmurs with mild bilateral lower extremity edema ABDOMEN:abdomen soft, non-tender and normal bowel sounds Musculoskeletal:no cyanosis of digits and no clubbing  NEURO: alert & oriented x 3 with fluent speech, no focal motor/sensory deficits  LABORATORY DATA:  I have reviewed the data as listed    Component Value Date/Time   NA 143 10/01/2016 0808   K 3.9 10/01/2016 0808   CL 109 07/25/2016 0939   CL 102 10/24/2012 1001   CO2 25 10/01/2016 0808   GLUCOSE 97 10/01/2016 0808   GLUCOSE 111 (H) 10/24/2012 1001   BUN 8.9 10/01/2016 0808   CREATININE 1.1 10/01/2016 0808   CALCIUM 8.9 10/01/2016 0808   PROT 6.7 10/01/2016 0808   ALBUMIN 3.1 (L) 10/01/2016 0808   AST 10 10/01/2016 0808   ALT 7 10/01/2016 0808   ALKPHOS 167 (H) 10/01/2016 0808   BILITOT 0.38 10/01/2016 0808   GFRNONAA 58 (L) 07/25/2016 0939   GFRAA >60 07/25/2016 0939    No results found for: SPEP, UPEP  Lab Results  Component Value Date   WBC 12.1 (H) 10/01/2016   NEUTROABS 10.2 (H) 10/01/2016   HGB 10.1 (L) 10/01/2016   HCT 31.8 (L) 10/01/2016   MCV 85.6 10/01/2016   PLT 137 (L) 10/01/2016      Chemistry      Component Value Date/Time   NA 143 10/01/2016 0808   K 3.9 10/01/2016 0808   CL 109 07/25/2016 0939   CL 102 10/24/2012 1001   CO2 25 10/01/2016 0808   BUN 8.9  10/01/2016 0808   CREATININE 1.1 10/01/2016 0808      Component Value Date/Time   CALCIUM 8.9 10/01/2016 0808   ALKPHOS 167 (H) 10/01/2016 0808   AST 10 10/01/2016 0808   ALT 7 10/01/2016 0808   BILITOT 0.38 10/01/2016 0808       RADIOGRAPHIC STUDIES: I have personally reviewed the radiological images as listed and agreed with the findings in the report. Ct Chest W Contrast  Result Date: 10/01/2016 CLINICAL DATA:  Followup metastatic left lung carcinoma.  Restaging. EXAM: CT CHEST WITH CONTRAST TECHNIQUE: Multidetector CT imaging of the chest was performed during intravenous contrast administration. CONTRAST:  75  mL Isovue 300 COMPARISON:  06/29/2016 FINDINGS: Cardiovascular: No acute findings. Aortic and coronary artery atherosclerosis. Mediastinum/Nodes: Mild subcarinal lymphadenopathy measures 1.8 cm on image 60/2, without significant change since prior study. Sub-cm lymph nodes are seen in the right cardiophrenic angle, largest measuring 8 mm on image 93/2. This also shows no significant change. Lungs/Pleura: Mild emphysema again noted. Right lung paramediastinal radiation changes are stable. Numerous sub-cm bilateral pulmonary nodules show no significant change since previous study. A nodule in the medial right lower lobe posterior to the right hilum currently measures 2.5 x 2.2 cm on image 66/2 compared to 1.8 x 1.6 cm previously. No other enlarging pulmonary nodules identified. No evidence of pleural effusion. Upper Abdomen: Stable hepatic steatosis and probable tiny sub-cm hepatic cysts. No evidence of adrenal mass. Musculoskeletal:  No suspicious bone lesions. IMPRESSION: Mild increase in size of 2.5 cm central right lower lobe pulmonary nodule, consistent with metastatic disease. Numerous other bilateral sub-cm pulmonary nodules are stable. Stable mild subcarinal and right cardiophrenic angle lymphadenopathy. Stable emphysema and right lung radiation changes. Stable hepatic steatosis. Aortic and coronary artery atherosclerosis. Electronically Signed   By: Earle Gell M.D.   On: 10/01/2016 13:09    ASSESSMENT & PLAN:  Cancer of upper lobe of left lung, Adenocarcinoma I reviewed the CT imaging with the patient. Radiologist commented an enlarging lung nodule which I am not convinced I will talk to the radiation oncologist to see her thoughts whether SBRT is appropriate For now, overall, she has stable disease on chemo I plan to continue treatment without dose adjustment and repeat imaging study again in August  Pancytopenia, acquired [facility]) Pancytopenia is due to chemo I have requested addition of Neulasta  in addition to dose reduction She is not symptomatic from anemia or thrombocytopenia.  TOBACCO ABUSE I spent some time counseling the patient the importance of tobacco cessation. She is currently not interested to quit now.  Protein calorie malnutrition ([place]) Recent appetite has improved on prednisone therapy. Recommend low-dose prednisone therapy to help treat her COPD and as appetite stimulant.   No orders of the defined types were placed in this encounter.  All questions were answered. The patient knows to call the clinic with any problems, questions or concerns. No barriers to learning was detected. I spent 25 minutes counseling the patient face to face. The total time spent in the appointment was 30 minutes and more than 50% was on counseling and review of test results     Heath Lark, MD 10/02/2016 12:41 PM

## 2016-10-02 NOTE — Assessment & Plan Note (Signed)
Pancytopenia is due to chemo I have requested addition of Neulasta in addition to dose reduction She is not symptomatic from anemia or thrombocytopenia. 

## 2016-10-02 NOTE — Patient Instructions (Signed)
[facility] Discharge Instructions for Patients Receiving Chemotherapy  Today you received the following chemotherapy agents: gemcitabine (Gemzar) and carboplatin (Paraplatin).  To help prevent nausea and vomiting after your treatment, we encourage you to take your nausea medication as directed.    If you develop nausea and vomiting that is not controlled by your nausea medication, call the clinic.   BELOW ARE SYMPTOMS THAT SHOULD BE REPORTED IMMEDIATELY:  *FEVER GREATER THAN 100.5 F  *CHILLS WITH OR WITHOUT FEVER  NAUSEA AND VOMITING THAT IS NOT CONTROLLED WITH YOUR NAUSEA MEDICATION  *UNUSUAL SHORTNESS OF BREATH  *UNUSUAL BRUISING OR BLEEDING  TENDERNESS IN MOUTH AND THROAT WITH OR WITHOUT PRESENCE OF ULCERS  *URINARY PROBLEMS  *BOWEL PROBLEMS  UNUSUAL RASH Items with * indicate a potential emergency and should be followed up as soon as possible.  Feel free to call the clinic you have any questions or concerns. The clinic phone number is [phone].  Please show the CHEMO ALERT CARD at check-in to the Emergency Department and triage nurse.   

## 2016-10-02 NOTE — Assessment & Plan Note (Signed)
Recent appetite has improved on prednisone therapy. Recommend low-dose prednisone therapy to help treat her COPD and as appetite stimulant.

## 2016-10-02 NOTE — Assessment & Plan Note (Signed)
I spent some time counseling the patient the importance of tobacco cessation. She is currently not interested to quit now. 

## 2016-10-02 NOTE — Assessment & Plan Note (Signed)
I reviewed the CT imaging with the patient. Radiologist commented an enlarging lung nodule which I am not convinced I will talk to the radiation oncologist to see her thoughts whether SBRT is appropriate For now, overall, she has stable disease on chemo I plan to continue treatment without dose adjustment and repeat imaging study again in August

## 2016-10-06 ENCOUNTER — Other Ambulatory Visit: Payer: Self-pay

## 2016-10-06 ENCOUNTER — Other Ambulatory Visit: Payer: Self-pay | Admitting: Hematology and Oncology

## 2016-10-06 ENCOUNTER — Other Ambulatory Visit: Payer: Self-pay | Admitting: Family Medicine

## 2016-10-06 DIAGNOSIS — G620 Drug-induced polyneuropathy: Secondary | ICD-10-CM

## 2016-10-06 DIAGNOSIS — T451X5A Adverse effect of antineoplastic and immunosuppressive drugs, initial encounter: Principal | ICD-10-CM

## 2016-10-06 DIAGNOSIS — C3490 Malignant neoplasm of unspecified part of unspecified bronchus or lung: Secondary | ICD-10-CM

## 2016-10-06 MED ORDER — DICLOFENAC SODIUM 1 % TD GEL: 2.0000 g | Freq: Four times a day (QID) | TRANSDERMAL | 1 refills | Status: AC | PRN

## 2016-10-07 NOTE — Progress Notes (Signed)
[facility] Counseling Note  Counselor called patient to see if patient was interested in scheduling another counseling appointment. Due to counselor's change in schedule, counselor is no longer able to visit patient during her infusion appointments. Patient was unwilling to come to [place] another time, so counselor and client were unable to schedule an appointment. Counselor reminded patient of other support services, such as group, and let pt know that she is available this summer if patient would like to call to schedule another appointment.  Westly Pam, [place] [facility] Painter

## 2016-10-09 ENCOUNTER — Ambulatory Visit: Payer: Medicare HMO

## 2016-10-09 ENCOUNTER — Ambulatory Visit (HOSPITAL_BASED_OUTPATIENT_CLINIC_OR_DEPARTMENT_OTHER): Payer: Medicare HMO

## 2016-10-09 ENCOUNTER — Other Ambulatory Visit (HOSPITAL_BASED_OUTPATIENT_CLINIC_OR_DEPARTMENT_OTHER): Payer: Medicare HMO

## 2016-10-09 VITALS — BP 147/68 | HR 77 | Temp 98.2°F | Resp 18

## 2016-10-09 DIAGNOSIS — C7949 Secondary malignant neoplasm of other parts of nervous system: Principal | ICD-10-CM

## 2016-10-09 DIAGNOSIS — C7931 Secondary malignant neoplasm of brain: Secondary | ICD-10-CM

## 2016-10-09 DIAGNOSIS — Z5111 Encounter for antineoplastic chemotherapy: Secondary | ICD-10-CM

## 2016-10-09 DIAGNOSIS — C3412 Malignant neoplasm of upper lobe, left bronchus or lung: Secondary | ICD-10-CM | POA: Diagnosis not present

## 2016-10-09 DIAGNOSIS — Z5189 Encounter for other specified aftercare: Secondary | ICD-10-CM | POA: Diagnosis not present

## 2016-10-09 LAB — COMPREHENSIVE METABOLIC PANEL
ALBUMIN: 3 g/dL — AB (ref 3.5–5.0)
ALK PHOS: 136 U/L (ref 40–150)
ALT: 7 U/L (ref 0–55)
ANION GAP: 7 meq/L (ref 3–11)
AST: 10 U/L (ref 5–34)
BUN: 9.3 mg/dL (ref 7.0–26.0)
CALCIUM: 9 mg/dL (ref 8.4–10.4)
CHLORIDE: 109 meq/L (ref 98–109)
CO2: 25 mEq/L (ref 22–29)
CREATININE: 1 mg/dL (ref 0.6–1.1)
EGFR: 65 mL/min/{1.73_m2} — ABNORMAL LOW (ref >90)
Glucose: 87 mg/dl (ref 70–140)
Potassium: 3.8 mEq/L (ref 3.5–5.1)
Sodium: 142 mEq/L (ref 136–145)
Total Bilirubin: 0.32 mg/dL (ref 0.20–1.20)
Total Protein: 6.4 g/dL (ref 6.4–8.3)

## 2016-10-09 LAB — CBC WITH DIFFERENTIAL/PLATELET
BASO%: 0.7 % (ref 0.0–2.0)
BASOS ABS: 0 10*3/uL (ref 0.0–0.1)
EOS%: 0.5 % (ref 0.0–7.0)
Eosinophils Absolute: 0 10*3/uL (ref 0.0–0.5)
HEMATOCRIT: 30.1 % — AB (ref 34.8–46.6)
HEMOGLOBIN: 9.6 g/dL — AB (ref 11.6–15.9)
LYMPH%: 12 % — ABNORMAL LOW (ref 14.0–49.7)
MCH: 27.2 pg (ref 25.1–34.0)
MCHC: 31.9 g/dL (ref 31.5–36.0)
MCV: 85.1 fL (ref 79.5–101.0)
MONO#: 0.5 10*3/uL (ref 0.1–0.9)
MONO%: 9.2 % (ref 0.0–14.0)
NEUT#: 3.8 10*3/uL (ref 1.5–6.5)
NEUT%: 77.6 % — AB (ref 38.4–76.8)
PLATELETS: 184 10*3/uL (ref 145–400)
RBC: 3.54 10*6/uL — ABNORMAL LOW (ref 3.70–5.45)
RDW: 22 % — AB (ref 11.2–14.5)
WBC: 4.9 10*3/uL (ref 3.9–10.3)
lymph#: 0.6 10*3/uL — ABNORMAL LOW (ref 0.9–3.3)

## 2016-10-09 MED ORDER — SODIUM CHLORIDE 0.9% FLUSH
10.0000 mL | INTRAVENOUS | Status: DC | PRN
  Administered 2016-10-09: 10 mL
  Filled 2016-10-09: qty 10

## 2016-10-09 MED ORDER — PALONOSETRON HCL INJECTION 0.25 MG/5ML
INTRAVENOUS | Status: AC
  Filled 2016-10-09: qty 5

## 2016-10-09 MED ORDER — DEXAMETHASONE SODIUM PHOSPHATE 10 MG/ML IJ SOLN
10.0000 mg | Freq: Once | INTRAMUSCULAR | Status: AC
  Administered 2016-10-09: 10 mg via INTRAVENOUS

## 2016-10-09 MED ORDER — CARBOPLATIN CHEMO INJECTION 450 MG/45ML
134.5600 mg | Freq: Once | INTRAVENOUS | Status: AC
  Administered 2016-10-09: 130 mg via INTRAVENOUS
  Filled 2016-10-09: qty 13

## 2016-10-09 MED ORDER — SODIUM CHLORIDE 0.9 % IJ SOLN
10.0000 mL | INTRAMUSCULAR | Status: DC | PRN
  Administered 2016-10-09: 10 mL via INTRAVENOUS
  Filled 2016-10-09: qty 10

## 2016-10-09 MED ORDER — SODIUM CHLORIDE 0.9 % IV SOLN
Freq: Once | INTRAVENOUS | Status: AC
  Administered 2016-10-09: 11:00:00 via INTRAVENOUS

## 2016-10-09 MED ORDER — HEPARIN SOD (PORK) LOCK FLUSH 100 UNIT/ML IV SOLN
500.0000 [IU] | Freq: Once | INTRAVENOUS | Status: AC | PRN
  Administered 2016-10-09: 500 [IU]
  Filled 2016-10-09: qty 5

## 2016-10-09 MED ORDER — PALONOSETRON HCL INJECTION 0.25 MG/5ML
0.2500 mg | Freq: Once | INTRAVENOUS | Status: AC
  Administered 2016-10-09: 0.25 mg via INTRAVENOUS

## 2016-10-09 MED ORDER — PEGFILGRASTIM 6 MG/0.6ML ~~LOC~~ PSKT
6.0000 mg | PREFILLED_SYRINGE | Freq: Once | SUBCUTANEOUS | Status: AC
  Administered 2016-10-09: 6 mg via SUBCUTANEOUS
  Filled 2016-10-09: qty 0.6

## 2016-10-09 MED ORDER — SODIUM CHLORIDE 0.9 % IV SOLN
500.0000 mg/m2 | Freq: Once | INTRAVENOUS | Status: AC
  Administered 2016-10-09: 912 mg via INTRAVENOUS
  Filled 2016-10-09: qty 23.99

## 2016-10-09 MED ORDER — DEXAMETHASONE SODIUM PHOSPHATE 10 MG/ML IJ SOLN
INTRAMUSCULAR | Status: AC
  Filled 2016-10-09: qty 1

## 2016-10-09 MED ORDER — CARBOPLATIN CHEMO INTRADERMAL TEST DOSE 100MCG/0.02ML
100.0000 ug | Freq: Once | INTRADERMAL | Status: AC
  Administered 2016-10-09: 100 ug via INTRADERMAL
  Filled 2016-10-09: qty 0.02

## 2016-10-09 NOTE — Patient Instructions (Signed)
[facility] Discharge Instructions for Patients Receiving Chemotherapy  Today you received the following chemotherapy agents: gemcitabine (Gemzar) and carboplatin (Paraplatin).  To help prevent nausea and vomiting after your treatment, we encourage you to take your nausea medication as directed.    If you develop nausea and vomiting that is not controlled by your nausea medication, call the clinic.   BELOW ARE SYMPTOMS THAT SHOULD BE REPORTED IMMEDIATELY:  *FEVER GREATER THAN 100.5 F  *CHILLS WITH OR WITHOUT FEVER  NAUSEA AND VOMITING THAT IS NOT CONTROLLED WITH YOUR NAUSEA MEDICATION  *UNUSUAL SHORTNESS OF BREATH  *UNUSUAL BRUISING OR BLEEDING  TENDERNESS IN MOUTH AND THROAT WITH OR WITHOUT PRESENCE OF ULCERS  *URINARY PROBLEMS  *BOWEL PROBLEMS  UNUSUAL RASH Items with * indicate a potential emergency and should be followed up as soon as possible.  Feel free to call the clinic you have any questions or concerns. The clinic phone number is [phone].  Please show the [place] at check-in to the Emergency Department and triage nurse.

## 2016-10-10 ENCOUNTER — Telehealth: Payer: Self-pay | Admitting: Hematology and Oncology

## 2016-10-10 NOTE — Telephone Encounter (Signed)
Called pt to give appt 6/8 received no answer and no vm. Mailed pt calendars for June and July 2018.

## 2016-10-15 ENCOUNTER — Encounter: Payer: Self-pay | Admitting: Family Medicine

## 2016-10-15 ENCOUNTER — Ambulatory Visit (INDEPENDENT_AMBULATORY_CARE_PROVIDER_SITE_OTHER): Payer: Medicare HMO | Admitting: Family Medicine

## 2016-10-15 DIAGNOSIS — F330 Major depressive disorder, recurrent, mild: Secondary | ICD-10-CM

## 2016-10-15 DIAGNOSIS — F430 Acute stress reaction: Secondary | ICD-10-CM

## 2016-10-15 NOTE — Patient Instructions (Signed)
Take you medications as close to directions as you can.   If the pain in your left heel gets worse, please let Dr Neri Vieyra know. If the pain around your right ear get worse, please let Dr Ketara Cavness know.

## 2016-10-16 ENCOUNTER — Encounter: Payer: Self-pay | Admitting: Family Medicine

## 2016-10-16 NOTE — Assessment & Plan Note (Signed)
Psychological supportive counseling provided. Stable.  Good coping.

## 2016-10-16 NOTE — Progress Notes (Signed)
20 minutes face to face where spent in total with counselingwith patient about her conscerns around her cancer, her prognosis, stress from care of her of her youngest son whois living with her, stress placed on her dgt, how she copes.  Psychological support provided.  Pt continues to have a positive outlook but a realistic one as well.   No symptoms of major mood disorder.  Pain and dyspnea adequately controlled.   Patient is not taking medications prescribved as directed. Med list updated  Will see pt back in 4 to 6 weeks or prn.

## 2016-10-16 NOTE — Assessment & Plan Note (Signed)
Psychological supportive counseling provided.

## 2016-10-20 MED FILL — LIDOCAINE-PRILOCAINE CREAM: 2.5-2.5 | 20 days supply | Qty: 30 | Fill #0

## 2016-10-20 MED FILL — GABAPENTIN 300 MG CAPSULE: 300 | 30 days supply | Qty: 90 | Fill #0

## 2016-10-20 MED FILL — VOLTAREN 1% GEL: 1 | 12 days supply | Qty: 100 | Fill #0

## 2016-10-23 ENCOUNTER — Ambulatory Visit (HOSPITAL_BASED_OUTPATIENT_CLINIC_OR_DEPARTMENT_OTHER): Payer: Medicare HMO

## 2016-10-23 ENCOUNTER — Ambulatory Visit: Payer: Medicare HMO

## 2016-10-23 ENCOUNTER — Ambulatory Visit (HOSPITAL_BASED_OUTPATIENT_CLINIC_OR_DEPARTMENT_OTHER): Payer: Medicare HMO | Admitting: Hematology and Oncology

## 2016-10-23 ENCOUNTER — Other Ambulatory Visit (HOSPITAL_BASED_OUTPATIENT_CLINIC_OR_DEPARTMENT_OTHER): Payer: Medicare HMO

## 2016-10-23 DIAGNOSIS — C7949 Secondary malignant neoplasm of other parts of nervous system: Secondary | ICD-10-CM

## 2016-10-23 DIAGNOSIS — D6481 Anemia due to antineoplastic chemotherapy: Secondary | ICD-10-CM

## 2016-10-23 DIAGNOSIS — Z720 Tobacco use: Secondary | ICD-10-CM | POA: Diagnosis not present

## 2016-10-23 DIAGNOSIS — T451X5A Adverse effect of antineoplastic and immunosuppressive drugs, initial encounter: Secondary | ICD-10-CM

## 2016-10-23 DIAGNOSIS — F17200 Nicotine dependence, unspecified, uncomplicated: Secondary | ICD-10-CM

## 2016-10-23 DIAGNOSIS — C3412 Malignant neoplasm of upper lobe, left bronchus or lung: Secondary | ICD-10-CM | POA: Diagnosis not present

## 2016-10-23 DIAGNOSIS — Z5111 Encounter for antineoplastic chemotherapy: Secondary | ICD-10-CM

## 2016-10-23 DIAGNOSIS — C7931 Secondary malignant neoplasm of brain: Secondary | ICD-10-CM

## 2016-10-23 LAB — COMPREHENSIVE METABOLIC PANEL
AST: 10 U/L (ref 5–34)
Albumin: 3.1 g/dL — ABNORMAL LOW (ref 3.5–5.0)
Alkaline Phosphatase: 157 U/L — ABNORMAL HIGH (ref 40–150)
Anion Gap: 9 mEq/L (ref 3–11)
BILIRUBIN TOTAL: 0.36 mg/dL (ref 0.20–1.20)
BUN: 6.3 mg/dL — ABNORMAL LOW (ref 7.0–26.0)
CO2: 27 mEq/L (ref 22–29)
CREATININE: 1 mg/dL (ref 0.6–1.1)
Calcium: 9 mg/dL (ref 8.4–10.4)
Chloride: 109 mEq/L (ref 98–109)
EGFR: 64 mL/min/{1.73_m2} — ABNORMAL LOW (ref >90)
Glucose: 85 mg/dl (ref 70–140)
Potassium: 3.9 mEq/L (ref 3.5–5.1)
Sodium: 144 mEq/L (ref 136–145)
TOTAL PROTEIN: 6.4 g/dL (ref 6.4–8.3)

## 2016-10-23 LAB — CBC WITH DIFFERENTIAL/PLATELET
BASO%: 0.5 % (ref 0.0–2.0)
Basophils Absolute: 0 10*3/uL (ref 0.0–0.1)
EOS%: 0.2 % (ref 0.0–7.0)
Eosinophils Absolute: 0 10*3/uL (ref 0.0–0.5)
HEMATOCRIT: 31.5 % — AB (ref 34.8–46.6)
HGB: 9.9 g/dL — ABNORMAL LOW (ref 11.6–15.9)
LYMPH#: 0.8 10*3/uL — AB (ref 0.9–3.3)
LYMPH%: 9.2 % — AB (ref 14.0–49.7)
MCH: 26.9 pg (ref 25.1–34.0)
MCHC: 31.4 g/dL — ABNORMAL LOW (ref 31.5–36.0)
MCV: 85.7 fL (ref 79.5–101.0)
MONO#: 0.9 10*3/uL (ref 0.1–0.9)
MONO%: 10.2 % (ref 0.0–14.0)
NEUT%: 79.9 % — ABNORMAL HIGH (ref 38.4–76.8)
NEUTROS ABS: 7.2 10*3/uL — AB (ref 1.5–6.5)
PLATELETS: 172 10*3/uL (ref 145–400)
RBC: 3.68 10*6/uL — AB (ref 3.70–5.45)
RDW: 23 % — AB (ref 11.2–14.5)
WBC: 9 10*3/uL (ref 3.9–10.3)

## 2016-10-23 MED ORDER — CARBOPLATIN CHEMO INTRADERMAL TEST DOSE 100MCG/0.02ML
100.0000 ug | Freq: Once | INTRADERMAL | Status: AC
  Administered 2016-10-23: 100 ug via INTRADERMAL
  Filled 2016-10-23: qty 0.02

## 2016-10-23 MED ORDER — DEXAMETHASONE SODIUM PHOSPHATE 10 MG/ML IJ SOLN
10.0000 mg | Freq: Once | INTRAMUSCULAR | Status: AC
  Administered 2016-10-23: 10 mg via INTRAVENOUS

## 2016-10-23 MED ORDER — PALONOSETRON HCL INJECTION 0.25 MG/5ML
INTRAVENOUS | Status: AC
  Filled 2016-10-23: qty 5

## 2016-10-23 MED ORDER — DEXAMETHASONE SODIUM PHOSPHATE 10 MG/ML IJ SOLN
INTRAMUSCULAR | Status: AC
  Filled 2016-10-23: qty 1

## 2016-10-23 MED ORDER — SODIUM CHLORIDE 0.9% FLUSH
10.0000 mL | INTRAVENOUS | Status: DC | PRN
  Administered 2016-10-23: 10 mL
  Filled 2016-10-23: qty 10

## 2016-10-23 MED ORDER — GEMCITABINE HCL CHEMO INJECTION 1 GM/26.3ML
500.0000 mg/m2 | Freq: Once | INTRAVENOUS | Status: AC
  Administered 2016-10-23: 912 mg via INTRAVENOUS
  Filled 2016-10-23: qty 23.99

## 2016-10-23 MED ORDER — PALONOSETRON HCL INJECTION 0.25 MG/5ML
0.2500 mg | Freq: Once | INTRAVENOUS | Status: AC
  Administered 2016-10-23: 0.25 mg via INTRAVENOUS

## 2016-10-23 MED ORDER — SODIUM CHLORIDE 0.9 % IV SOLN
134.5600 mg | Freq: Once | INTRAVENOUS | Status: AC
  Administered 2016-10-23: 130 mg via INTRAVENOUS
  Filled 2016-10-23: qty 13

## 2016-10-23 MED ORDER — SODIUM CHLORIDE 0.9 % IV SOLN
Freq: Once | INTRAVENOUS | Status: AC
  Administered 2016-10-23: 14:00:00 via INTRAVENOUS

## 2016-10-23 MED ORDER — HEPARIN SOD (PORK) LOCK FLUSH 100 UNIT/ML IV SOLN
500.0000 [IU] | Freq: Once | INTRAVENOUS | Status: AC | PRN
  Administered 2016-10-23: 500 [IU]
  Filled 2016-10-23: qty 5

## 2016-10-23 NOTE — Progress Notes (Signed)
[facility] OFFICE PROGRESS NOTE  Patient Care Team: McDiarmid, Blane Ohara, MD as PCP - General (Family Medicine) Gaye Pollack, MD (Cardiothoracic Surgery) Dickie La, MD (Family Medicine) Heath Lark, MD as Consulting Physician (Hematology and Oncology) Pleasant, Eppie Gibson, RN as [place], [place], [place] (Optometry) Milus Banister, MD as Consulting Physician (Gastroenterology) Melida Quitter, MD as Consulting Physician (Otolaryngology)  SUMMARY OF ONCOLOGIC HISTORY: Oncology History   Colon cancer   Primary site: Colon and Rectum (Left)   Staging method: AJCC 7th Edition   Clinical: Stage I (T2, N0, M0) signed by Heath Lark, MD on 05/31/2013  2:39 PM   Pathologic: Stage I (T2, N0, cM0) signed by Heath Lark, MD on 05/31/2013  2:39 PM   Summary: Stage I (T2, N0, cM0) Lung cancer, EGFR/ALK negative, recurrence after initial resection to LN and brain   Primary site: Lung (Left)   Staging method: AJCC 7th Edition   Clinical: Stage IV (T1, N2, M1b) signed by Heath Lark, MD on 05/31/2013  2:26 PM   Pathologic: Stage IV (T1, N2, M1b) signed by Heath Lark, MD on 05/31/2013  2:26 PM   Summary: Stage IV (T1, N2, M1b)       Cancer of upper lobe of left lung, Adenocarcinoma   03/01/2007 Procedure    Colonoscopy revealed abnormalities and biopsy show high-grade dysplasia      04/01/2007 Surgery    She underwent sigmoid resection which showed T2 N0 colon cancer, and negative margins and all of 17 lymph nodes were negative      02/29/2008 Procedure    Repeat surveillance colonoscopy was negative.      06/16/2010 Surgery    She underwent left upper lobectomy we show well-differentiated adenocarcinoma of the lung, T1, N0, M0      03/18/2011 Procedure    Repeat colonoscopy show multiple polyps but there were benign      10/19/2011 Procedure    Biopsy of mediastinal lymph node came back positive for recurrence of lung cancer, EGFR and ALK  negative      11/16/2011 - 12/14/2011 Chemotherapy    She received concurrent chemoradiation therapy with weekly carboplatin and Taxol.      11/16/2011 - 12/29/2011 Radiation Therapy    She received radiation therapy with weekly chemotherapy      02/15/2012 Imaging    MR of the brain showed a new intracranial metastases. This was subsequently treated with stereotactic radiosurgery.      03/07/2012 - 04/12/2013 Chemotherapy    She received chemotherapy with maintainence Alimta every 3 weeks. Chemotherapy was discontinued due to profound fatigue      03/02/2013 Procedure    She had therapeutic ultrasound guidance thoracentesis for pleural effusion that came back negative for cancer      05/04/2013 Procedure    She had repeat ultrasound-guided thoracentesis again and cytology was negative      05/29/2013 Imaging    Repeat CT scan of the chest, abdomen and pelvis show no evidence of disease but persistent right-sided pleural effusion      06/02/2013 Surgery    The patient had placement of Pleurx catheter and subsequently underwent pleurodesis.      09/22/2013 Imaging    Repeat CT scan show no evidence of active disease. There are nonspecific lymphadenopathy and she is placed on observation.      03/23/2014 Imaging    Repeat CT scan of the chest, abdomen and pelvis show recurrence of cancer  with widespread bilateral pulmonary metastasis.      05/14/2014 Imaging    Imaging of the chest and brain were repeated due to delay of initiation of chemotherapy. Overall, chest CT scan show stable disease. MRI of the head was negative for recurrence      05/16/2014 - 07/18/2014 Chemotherapy     she completed 4 cycles of combination chemotherapy with carboplatin and Alimta      07/16/2014 Imaging     repeat CT scan of the chest, abdomen and pelvis show regression in the size of lung nodules.      08/29/2014 - 08/14/2015 Chemotherapy    She received maintenance Alimta      10/16/2014 Imaging      repeat CT scan of the chest, abdomen and pelvis show regression in the size of lung nodules.      01/29/2015 Imaging    Repeat CT scan showed stable disease      02/01/2015 Imaging    MRI brain is negative      04/30/2015 Imaging    CT scan of the chest, abdomen and pelvis showed stable disease      07/25/2015 Imaging    MRI brain showed no evidence of new disease      09/09/2015 Imaging    CT chest showed interval increase in size of large RIGHT upper lobe nodule is most consistent with lung cancer recurrence.      09/18/2015 - 03/26/2016 Chemotherapy    She started on Nivolumab      10/22/2015 Imaging    Screening mammogram showed mild abnormality      10/31/2015 Imaging    Diagnostic mammogram showed mild calcification at the right upper outer breast      11/27/2015 Imaging    Stable post treatment related changes of left lower lobectomy and radiation therapy redemonstrated, as above. No definite findings to suggest local recurrence of disease or metastatic disease on today's examination      01/24/2016 Imaging    MR brain showed continued stable appearance of two small treated brain metastases. No new or progressive metastatic disease to the brain.      04/15/2016 Imaging    Ct scan showed findings highly suspicious for recurrent tumor in the mediastinum surrounding the right mainstem bronchus and in the region of the azygoesophageal recess. Diffuse pulmonary metastatic disease with new and progressive nodules since the prior examination.      04/24/2016 -  Chemotherapy    The patient had chemotherapy with gemzar and Carboplatin      04/30/2016 Imaging    Decreased contrast enhancement of the two treated metastases in the right frontal and left occipital lobes. No new lesions.      06/01/2016 Imaging    CXR showed numerous metastatic nodules have progressed compared to the most recent comparison chest x-ray of 07/15/2015. Majority of these were noted on the 04/15/2016  chest CT. No segmental infiltrate or pulmonary edema.      06/19/2016 Adverse Reaction    Treatment is placed on hold today due to neutropenia and the patient not feeling well      06/29/2016 Imaging    Interval decrease in size of metastatic pulmonary nodules. 2. Persistent but slightly improved right hilar/mediastinal disease. No progressive findings. 3. No findings for upper abdominal metastatic disease      10/01/2016 Imaging    Mild increase in size of 2.5 cm central right lower lobe pulmonary nodule, consistent with metastatic disease. Numerous other bilateral sub-cm pulmonary  nodules are stable.  Stable mild subcarinal and right cardiophrenic angle lymphadenopathy. Stable emphysema and right lung radiation changes. Stable hepatic steatosis. Aortic and coronary artery atherosclerosis.       Brain metastases treated with radiosurgery   02/26/2012 Initial Diagnosis    Brain metastases treated with radiosurgery       INTERVAL HISTORY: Please see below for problem oriented charting. She returns for further follow-up. She has chronic cough and leg swelling, stable. Appetite is good. Denies peripheral neuropathy No recent infection Denies recent headaches or seizures  REVIEW OF SYSTEMS:   Constitutional: Denies fevers, chills or abnormal weight loss Eyes: Denies blurriness of vision Ears, nose, mouth, throat, and face: Denies mucositis or sore throat Cardiovascular: Denies palpitation, chest discomfort  Gastrointestinal:  Denies nausea, heartburn or change in bowel habits Skin: Denies abnormal skin rashes Lymphatics: Denies new lymphadenopathy or easy bruising Neurological:Denies numbness, tingling or new weaknesses Behavioral/Psych: Mood is stable, no new changes  All other systems were reviewed with the patient and are negative.  I have reviewed the past medical history, past surgical history, social history and family history with the patient and they are unchanged from  previous note.  ALLERGIES:  is allergic to adhesive [tape] and lisinopril.  MEDICATIONS:  Current Outpatient Prescriptions  Medication Sig Dispense Refill  . aspirin 81 MG EC tablet Take 81 mg by mouth daily.     Marland Kitchen BYSTOLIC 10 MG tablet Take 10 mg by mouth daily.     . cholecalciferol (VITAMIN D) 1000 units tablet Take 1,000 Units by mouth daily.     Marland Kitchen COLACE 100 MG capsule Take 100 mg by mouth daily as needed for mild constipation.     . diclofenac sodium (VOLTAREN) 1 % GEL Apply 2 g topically 4 (four) times daily as needed. 100 g 1  . gabapentin (NEURONTIN) 300 MG capsule     . HYDROmorphone (DILAUDID) 8 MG tablet Take 1 tablet (8 mg total) by mouth every 6 (six) hours as needed for severe pain. 60 tablet 0  . ipratropium (ATROVENT HFA) 17 MCG/ACT inhaler Inhale 2 puffs into the lungs every 4 (four) hours as needed for wheezing (Cough). 3 Inhaler 3  . lidocaine-prilocaine (EMLA) cream APPLY TOPICALLY AS NEEDED TO PORTA CATH SITE 1 HOUR PRIOR TO NEEDLE STICK. 30 g 0  . loratadine (CLARITIN) 10 MG tablet Take 10 mg by mouth daily.    Marland Kitchen omeprazole (PRILOSEC) 40 MG capsule Take 40 mg by mouth daily.     . pravastatin (PRAVACHOL) 40 MG tablet Take 40 mg by mouth daily.     Marland Kitchen umeclidinium-vilanterol (ANORO ELLIPTA) 62.5-25 MCG/INH AEPB Inhale 1 puff into the lungs daily. 3 each 3  . VENTOLIN HFA 108 (90 Base) MCG/ACT inhaler Inhale 2 puffs into the lungs every 4 (four) hours as needed for wheezing or shortness of breath. 3 Inhaler 3  . nitroGLYCERIN (NITROSTAT) 0.4 MG SL tablet Place 1 tablet (0.4 mg total) under the tongue every 5 (five) minutes as needed. For chest pain (Patient not taking: Reported on 10/23/2016) 25 tablet 5  . ondansetron (ZOFRAN ODT) 4 MG disintegrating tablet Take 1 tablet (4 mg total) by mouth every 8 (eight) hours as needed for nausea or vomiting. (Patient not taking: Reported on 09/18/2016) 20 tablet 0   No current facility-administered medications for this visit.     Facility-Administered Medications Ordered in Other Visits  Medication Dose Route Frequency Provider Last Rate Last Dose  . sodium chloride 0.9 % injection  10 mL  10 mL Intravenous PRN Alvy Bimler, Taylorann Tkach, MD   10 mL at 09/18/15 1345  . sodium chloride 0.9 % injection 10 mL  10 mL Intravenous PRN Alvy Bimler, Jonathin Heinicke, MD   10 mL at 04/24/16 1136    PHYSICAL EXAMINATION: ECOG PERFORMANCE STATUS: 1 - Symptomatic but completely ambulatory  Vitals:   10/23/16 1212  BP: 134/63  Pulse: 83  Resp: 18  Temp: 98 F (36.7 C)   Filed Weights   10/23/16 1212  Weight: 163 lb 1.6 oz (74 kg)    GENERAL:alert, no distress and comfortable SKIN: skin color, texture, turgor are normal, no rashes or significant lesions EYES: normal, Conjunctiva are pink and non-injected, sclera clear OROPHARYNX:no exudate, no erythema and lips, buccal mucosa, and tongue normal  NECK: supple, thyroid normal size, non-tender, without nodularity LYMPH:  no palpable lymphadenopathy in the cervical, axillary or inguinal LUNGS: clear to auscultation and percussion with normal breathing effort HEART: regular rate & rhythm and no murmurs with mild bilateral lower extremity edema ABDOMEN:abdomen soft, non-tender and normal bowel sounds Musculoskeletal:no cyanosis of digits and no clubbing  NEURO: alert & oriented x 3 with fluent speech, no focal motor/sensory deficits  LABORATORY DATA:  I have reviewed the data as listed    Component Value Date/Time   NA 144 10/23/2016 1133   K 3.9 10/23/2016 1133   CL 109 07/25/2016 0939   CL 102 10/24/2012 1001   CO2 27 10/23/2016 1133   GLUCOSE 85 10/23/2016 1133   GLUCOSE 111 (H) 10/24/2012 1001   BUN 6.3 (L) 10/23/2016 1133   CREATININE 1.0 10/23/2016 1133   CALCIUM 9.0 10/23/2016 1133   PROT 6.4 10/23/2016 1133   ALBUMIN 3.1 (L) 10/23/2016 1133   AST 10 10/23/2016 1133   ALT <6 10/23/2016 1133   ALKPHOS 157 (H) 10/23/2016 1133   BILITOT 0.36 10/23/2016 1133   GFRNONAA 58 (L)  07/25/2016 0939   GFRAA >60 07/25/2016 0939    No results found for: SPEP, UPEP  Lab Results  Component Value Date   WBC 9.0 10/23/2016   NEUTROABS 7.2 (H) 10/23/2016   HGB 9.9 (L) 10/23/2016   HCT 31.5 (L) 10/23/2016   MCV 85.7 10/23/2016   PLT 172 10/23/2016      Chemistry      Component Value Date/Time   NA 144 10/23/2016 1133   K 3.9 10/23/2016 1133   CL 109 07/25/2016 0939   CL 102 10/24/2012 1001   CO2 27 10/23/2016 1133   BUN 6.3 (L) 10/23/2016 1133   CREATININE 1.0 10/23/2016 1133      Component Value Date/Time   CALCIUM 9.0 10/23/2016 1133   ALKPHOS 157 (H) 10/23/2016 1133   AST 10 10/23/2016 1133   ALT <6 10/23/2016 1133   BILITOT 0.36 10/23/2016 1133       RADIOGRAPHIC STUDIES: I have personally reviewed the radiological images as listed and agreed with the findings in the report. Ct Chest W Contrast  Result Date: 10/01/2016 CLINICAL DATA:  Followup metastatic left lung carcinoma.  Restaging. EXAM: CT CHEST WITH CONTRAST TECHNIQUE: Multidetector CT imaging of the chest was performed during intravenous contrast administration. CONTRAST:  75 mL Isovue 300 COMPARISON:  06/29/2016 FINDINGS: Cardiovascular: No acute findings. Aortic and coronary artery atherosclerosis. Mediastinum/Nodes: Mild subcarinal lymphadenopathy measures 1.8 cm on image 60/2, without significant change since prior study. Sub-cm lymph nodes are seen in the right cardiophrenic angle, largest measuring 8 mm on image 93/2. This also shows no significant change. Lungs/Pleura:  Mild emphysema again noted. Right lung paramediastinal radiation changes are stable. Numerous sub-cm bilateral pulmonary nodules show no significant change since previous study. A nodule in the medial right lower lobe posterior to the right hilum currently measures 2.5 x 2.2 cm on image 66/2 compared to 1.8 x 1.6 cm previously. No other enlarging pulmonary nodules identified. No evidence of pleural effusion. Upper Abdomen: Stable  hepatic steatosis and probable tiny sub-cm hepatic cysts. No evidence of adrenal mass. Musculoskeletal:  No suspicious bone lesions. IMPRESSION: Mild increase in size of 2.5 cm central right lower lobe pulmonary nodule, consistent with metastatic disease. Numerous other bilateral sub-cm pulmonary nodules are stable. Stable mild subcarinal and right cardiophrenic angle lymphadenopathy. Stable emphysema and right lung radiation changes. Stable hepatic steatosis. Aortic and coronary artery atherosclerosis. Electronically Signed   By: Earle Gell M.D.   On: 10/01/2016 13:09    ASSESSMENT & PLAN:  Cancer of upper lobe of left lung, Adenocarcinoma I reviewed the CT imaging with the patient. For now, overall, she has stable disease on chemo I plan to continue treatment without dose adjustment and repeat imaging study again in August  Anemia due to chemotherapy This is likely due to recent treatment. The patient denies recent history of bleeding such as epistaxis, hematuria or hematochezia. She is asymptomatic from the anemia. I will observe for now.  She does not require transfusion now. I will continue the chemotherapy at current dose without dosage adjustment.  If the anemia gets progressive worse in the future, I might have to delay her treatment or adjust the chemotherapy dose.   TOBACCO ABUSE I spent some time counseling the patient the importance of tobacco cessation. She is currently not interested to quit now.  Brain metastases treated with radiosurgery Her most recent MRI in December 2017 showed no evidence of disease recurrence. Plan to continue surveillance imaging study again this month, arranged by radiation oncologist   No orders of the defined types were placed in this encounter.  All questions were answered. The patient knows to call the clinic with any problems, questions or concerns. No barriers to learning was detected. I spent 15 minutes counseling the patient face to face. The  total time spent in the appointment was 20 minutes and more than 50% was on counseling and review of test results     Heath Lark, MD 10/23/2016 1:11 PM

## 2016-10-23 NOTE — Progress Notes (Signed)
1515: Carbo test dose is negative. Treatment plan orders released.

## 2016-10-23 NOTE — Assessment & Plan Note (Signed)
I reviewed the CT imaging with the patient. For now, overall, she has stable disease on chemo I plan to continue treatment without dose adjustment and repeat imaging study again in August

## 2016-10-23 NOTE — Patient Instructions (Signed)
[facility] Discharge Instructions for Patients Receiving Chemotherapy  Today you received the following chemotherapy agents: gemcitabine (Gemzar) and carboplatin (Paraplatin).  To help prevent nausea and vomiting after your treatment, we encourage you to take your nausea medication as directed.    If you develop nausea and vomiting that is not controlled by your nausea medication, call the clinic.   BELOW ARE SYMPTOMS THAT SHOULD BE REPORTED IMMEDIATELY:  *FEVER GREATER THAN 100.5 F  *CHILLS WITH OR WITHOUT FEVER  NAUSEA AND VOMITING THAT IS NOT CONTROLLED WITH YOUR NAUSEA MEDICATION  *UNUSUAL SHORTNESS OF BREATH  *UNUSUAL BRUISING OR BLEEDING  TENDERNESS IN MOUTH AND THROAT WITH OR WITHOUT PRESENCE OF ULCERS  *URINARY PROBLEMS  *BOWEL PROBLEMS  UNUSUAL RASH Items with * indicate a potential emergency and should be followed up as soon as possible.  Feel free to call the clinic you have any questions or concerns. The clinic phone number is [phone].  Please show the [facility] at check-in to the Emergency Department and triage nurse.

## 2016-10-23 NOTE — Assessment & Plan Note (Signed)
I spent some time counseling the patient the importance of tobacco cessation. She is currently not interested to quit now.

## 2016-10-23 NOTE — Patient Instructions (Signed)

## 2016-10-23 NOTE — Assessment & Plan Note (Signed)

## 2016-10-23 NOTE — Assessment & Plan Note (Signed)
Her most recent MRI in December 2017 showed no evidence of disease recurrence. Plan to continue surveillance imaging study again this month, arranged by radiation oncologist

## 2016-10-28 ENCOUNTER — Encounter: Payer: Self-pay | Admitting: *Deleted

## 2016-10-30 ENCOUNTER — Ambulatory Visit: Payer: Medicare HMO

## 2016-10-30 ENCOUNTER — Other Ambulatory Visit (HOSPITAL_BASED_OUTPATIENT_CLINIC_OR_DEPARTMENT_OTHER): Payer: Medicare HMO

## 2016-10-30 ENCOUNTER — Ambulatory Visit (HOSPITAL_BASED_OUTPATIENT_CLINIC_OR_DEPARTMENT_OTHER): Payer: Medicare HMO

## 2016-10-30 VITALS — BP 155/90 | HR 90 | Temp 97.9°F | Resp 18

## 2016-10-30 DIAGNOSIS — C7949 Secondary malignant neoplasm of other parts of nervous system: Secondary | ICD-10-CM

## 2016-10-30 DIAGNOSIS — Z5111 Encounter for antineoplastic chemotherapy: Secondary | ICD-10-CM | POA: Diagnosis not present

## 2016-10-30 DIAGNOSIS — C3412 Malignant neoplasm of upper lobe, left bronchus or lung: Secondary | ICD-10-CM

## 2016-10-30 DIAGNOSIS — C7931 Secondary malignant neoplasm of brain: Secondary | ICD-10-CM

## 2016-10-30 DIAGNOSIS — Z5189 Encounter for other specified aftercare: Secondary | ICD-10-CM | POA: Diagnosis not present

## 2016-10-30 LAB — COMPREHENSIVE METABOLIC PANEL
ALBUMIN: 3.1 g/dL — AB (ref 3.5–5.0)
ALT: 7 U/L (ref 0–55)
AST: 13 U/L (ref 5–34)
Alkaline Phosphatase: 128 U/L (ref 40–150)
Anion Gap: 7 mEq/L (ref 3–11)
BILIRUBIN TOTAL: 0.39 mg/dL (ref 0.20–1.20)
BUN: 10.3 mg/dL (ref 7.0–26.0)
CALCIUM: 9 mg/dL (ref 8.4–10.4)
CHLORIDE: 109 meq/L (ref 98–109)
CO2: 26 mEq/L (ref 22–29)
CREATININE: 1 mg/dL (ref 0.6–1.1)
EGFR: 67 mL/min/{1.73_m2} — ABNORMAL LOW (ref >90)
Glucose: 98 mg/dl (ref 70–140)
Potassium: 3.3 mEq/L — ABNORMAL LOW (ref 3.5–5.1)
Sodium: 142 mEq/L (ref 136–145)
TOTAL PROTEIN: 6.5 g/dL (ref 6.4–8.3)

## 2016-10-30 LAB — CBC WITH DIFFERENTIAL/PLATELET
BASO%: 1 % (ref 0.0–2.0)
Basophils Absolute: 0 10*3/uL (ref 0.0–0.1)
EOS%: 0.3 % (ref 0.0–7.0)
Eosinophils Absolute: 0 10*3/uL (ref 0.0–0.5)
HCT: 30.2 % — ABNORMAL LOW (ref 34.8–46.6)
HEMOGLOBIN: 9.5 g/dL — AB (ref 11.6–15.9)
LYMPH%: 13.3 % — ABNORMAL LOW (ref 14.0–49.7)
MCH: 26.8 pg (ref 25.1–34.0)
MCHC: 31.6 g/dL (ref 31.5–36.0)
MCV: 84.9 fL (ref 79.5–101.0)
MONO#: 0.4 10*3/uL (ref 0.1–0.9)
MONO%: 9.5 % (ref 0.0–14.0)
NEUT%: 75.9 % (ref 38.4–76.8)
NEUTROS ABS: 3.6 10*3/uL (ref 1.5–6.5)
PLATELETS: 200 10*3/uL (ref 145–400)
RBC: 3.56 10*6/uL — ABNORMAL LOW (ref 3.70–5.45)
RDW: 22.8 % — AB (ref 11.2–14.5)
WBC: 4.7 10*3/uL (ref 3.9–10.3)
lymph#: 0.6 10*3/uL — ABNORMAL LOW (ref 0.9–3.3)

## 2016-10-30 MED ORDER — SODIUM CHLORIDE 0.9% FLUSH
10.0000 mL | INTRAVENOUS | Status: DC | PRN
  Administered 2016-10-30: 10 mL
  Filled 2016-10-30: qty 10

## 2016-10-30 MED ORDER — DEXAMETHASONE SODIUM PHOSPHATE 10 MG/ML IJ SOLN
10.0000 mg | Freq: Once | INTRAMUSCULAR | Status: AC
  Administered 2016-10-30: 10 mg via INTRAVENOUS

## 2016-10-30 MED ORDER — PALONOSETRON HCL INJECTION 0.25 MG/5ML
0.2500 mg | Freq: Once | INTRAVENOUS | Status: AC
  Administered 2016-10-30: 0.25 mg via INTRAVENOUS

## 2016-10-30 MED ORDER — SODIUM CHLORIDE 0.9 % IV SOLN
134.5600 mg | Freq: Once | INTRAVENOUS | Status: AC
  Administered 2016-10-30: 130 mg via INTRAVENOUS
  Filled 2016-10-30: qty 13

## 2016-10-30 MED ORDER — DEXAMETHASONE SODIUM PHOSPHATE 10 MG/ML IJ SOLN
INTRAMUSCULAR | Status: AC
  Filled 2016-10-30: qty 1

## 2016-10-30 MED ORDER — SODIUM CHLORIDE 0.9 % IV SOLN
Freq: Once | INTRAVENOUS | Status: AC
  Administered 2016-10-30: 14:00:00 via INTRAVENOUS

## 2016-10-30 MED ORDER — PALONOSETRON HCL INJECTION 0.25 MG/5ML
INTRAVENOUS | Status: AC
  Filled 2016-10-30: qty 5

## 2016-10-30 MED ORDER — ALTEPLASE 2 MG IJ SOLR
2.0000 mg | Freq: Once | INTRAMUSCULAR | Status: DC | PRN
  Filled 2016-10-30: qty 2

## 2016-10-30 MED ORDER — PEGFILGRASTIM 6 MG/0.6ML ~~LOC~~ PSKT
6.0000 mg | PREFILLED_SYRINGE | Freq: Once | SUBCUTANEOUS | Status: AC
  Administered 2016-10-30: 6 mg via SUBCUTANEOUS
  Filled 2016-10-30: qty 0.6

## 2016-10-30 MED ORDER — HEPARIN SOD (PORK) LOCK FLUSH 100 UNIT/ML IV SOLN
500.0000 [IU] | Freq: Once | INTRAVENOUS | Status: DC | PRN
  Filled 2016-10-30: qty 5

## 2016-10-30 MED ORDER — CARBOPLATIN CHEMO INTRADERMAL TEST DOSE 100MCG/0.02ML
100.0000 ug | Freq: Once | INTRADERMAL | Status: AC
  Administered 2016-10-30: 100 ug via INTRADERMAL
  Filled 2016-10-30: qty 0.02

## 2016-10-30 MED ORDER — HEPARIN SOD (PORK) LOCK FLUSH 100 UNIT/ML IV SOLN
500.0000 [IU] | Freq: Once | INTRAVENOUS | Status: AC | PRN
  Administered 2016-10-30: 500 [IU]
  Filled 2016-10-30: qty 5

## 2016-10-30 MED ORDER — GEMCITABINE HCL CHEMO INJECTION 1 GM/26.3ML
500.0000 mg/m2 | Freq: Once | INTRAVENOUS | Status: AC
  Administered 2016-10-30: 912 mg via INTRAVENOUS
  Filled 2016-10-30: qty 23.99

## 2016-10-30 NOTE — Progress Notes (Signed)
Pt reports decreased energy where she is resting the majority of the day, potassium 3.3. Dr. Alvy Bimler aware, okay to treat and no new orders at this time.

## 2016-10-30 NOTE — Patient Instructions (Signed)
[facility] Discharge Instructions for Patients Receiving Chemotherapy  Today you received the following chemotherapy agents: Gemzar and Carboplatin   To help prevent nausea and vomiting after your treatment, we encourage you to take your nausea medication as directed.    If you develop nausea and vomiting that is not controlled by your nausea medication, call the clinic.   BELOW ARE SYMPTOMS THAT SHOULD BE REPORTED IMMEDIATELY:  *FEVER GREATER THAN 100.5 F  *CHILLS WITH OR WITHOUT FEVER  NAUSEA AND VOMITING THAT IS NOT CONTROLLED WITH YOUR NAUSEA MEDICATION  *UNUSUAL SHORTNESS OF BREATH  *UNUSUAL BRUISING OR BLEEDING  TENDERNESS IN MOUTH AND THROAT WITH OR WITHOUT PRESENCE OF ULCERS  *URINARY PROBLEMS  *BOWEL PROBLEMS  UNUSUAL RASH Items with * indicate a potential emergency and should be followed up as soon as possible.  Feel free to call the clinic you have any questions or concerns. The clinic phone number is [phone].  Please show the [facility] at check-in to the Emergency Department and triage nurse.

## 2016-11-06 ENCOUNTER — Encounter (HOSPITAL_COMMUNITY): Payer: Self-pay | Admitting: *Deleted

## 2016-11-06 ENCOUNTER — Emergency Department (HOSPITAL_COMMUNITY)
Admission: EM | Admit: 2016-11-06 | Discharge: 2016-11-06 | Disposition: A | Discharge status: Home / Self Care | Payer: Medicare HMO | Attending: Emergency Medicine | Admitting: Emergency Medicine

## 2016-11-06 ENCOUNTER — Other Ambulatory Visit: Payer: Self-pay

## 2016-11-06 ENCOUNTER — Ambulatory Visit
Admission: RE | Admit: 2016-11-06 | Discharge: 2016-11-06 | Disposition: A | Discharge status: Home / Self Care | Payer: Medicare HMO | Source: Ambulatory Visit | Attending: Radiation Oncology | Admitting: Radiation Oncology

## 2016-11-06 DIAGNOSIS — R5383 Other fatigue: Secondary | ICD-10-CM | POA: Diagnosis not present

## 2016-11-06 DIAGNOSIS — J984 Other disorders of lung: Secondary | ICD-10-CM | POA: Diagnosis not present

## 2016-11-06 DIAGNOSIS — J449 Chronic obstructive pulmonary disease, unspecified: Secondary | ICD-10-CM | POA: Diagnosis not present

## 2016-11-06 DIAGNOSIS — I10 Essential (primary) hypertension: Secondary | ICD-10-CM | POA: Diagnosis not present

## 2016-11-06 DIAGNOSIS — J80 Acute respiratory distress syndrome: Secondary | ICD-10-CM | POA: Diagnosis not present

## 2016-11-06 DIAGNOSIS — I2510 Atherosclerotic heart disease of native coronary artery without angina pectoris: Secondary | ICD-10-CM | POA: Insufficient documentation

## 2016-11-06 DIAGNOSIS — F17210 Nicotine dependence, cigarettes, uncomplicated: Secondary | ICD-10-CM | POA: Insufficient documentation

## 2016-11-06 DIAGNOSIS — Z85118 Personal history of other malignant neoplasm of bronchus and lung: Secondary | ICD-10-CM | POA: Diagnosis not present

## 2016-11-06 DIAGNOSIS — R112 Nausea with vomiting, unspecified: Secondary | ICD-10-CM | POA: Insufficient documentation

## 2016-11-06 DIAGNOSIS — C7931 Secondary malignant neoplasm of brain: Secondary | ICD-10-CM | POA: Insufficient documentation

## 2016-11-06 DIAGNOSIS — Z79899 Other long term (current) drug therapy: Secondary | ICD-10-CM | POA: Diagnosis not present

## 2016-11-06 DIAGNOSIS — C3490 Malignant neoplasm of unspecified part of unspecified bronchus or lung: Secondary | ICD-10-CM | POA: Diagnosis not present

## 2016-11-06 DIAGNOSIS — R42 Dizziness and giddiness: Secondary | ICD-10-CM | POA: Diagnosis not present

## 2016-11-06 DIAGNOSIS — R1110 Vomiting, unspecified: Secondary | ICD-10-CM | POA: Diagnosis not present

## 2016-11-06 DIAGNOSIS — C799 Secondary malignant neoplasm of unspecified site: Secondary | ICD-10-CM | POA: Diagnosis not present

## 2016-11-06 DIAGNOSIS — R531 Weakness: Secondary | ICD-10-CM | POA: Diagnosis not present

## 2016-11-06 DIAGNOSIS — C7949 Secondary malignant neoplasm of other parts of nervous system: Principal | ICD-10-CM

## 2016-11-06 DIAGNOSIS — Z85038 Personal history of other malignant neoplasm of large intestine: Secondary | ICD-10-CM | POA: Diagnosis not present

## 2016-11-06 DIAGNOSIS — C801 Malignant (primary) neoplasm, unspecified: Secondary | ICD-10-CM | POA: Diagnosis not present

## 2016-11-06 LAB — CBC WITH DIFFERENTIAL/PLATELET
BAND NEUTROPHILS: 6 %
BASOS ABS: 0 10*3/uL (ref 0.0–0.1)
BASOS PCT: 0 %
BLASTS: 0 %
EOS ABS: 0 10*3/uL (ref 0.0–0.7)
Eosinophils Relative: 0 %
HCT: 30.3 % — ABNORMAL LOW (ref 36.0–46.0)
HEMOGLOBIN: 9.4 g/dL — AB (ref 12.0–15.0)
Lymphocytes Relative: 6 %
Lymphs Abs: 1.4 10*3/uL (ref 0.7–4.0)
MCH: 26.7 pg (ref 26.0–34.0)
MCHC: 31 g/dL (ref 30.0–36.0)
MCV: 86.1 fL (ref 78.0–100.0)
METAMYELOCYTES PCT: 1 %
MONO ABS: 0.7 10*3/uL (ref 0.1–1.0)
MYELOCYTES: 0 %
Monocytes Relative: 3 %
Neutro Abs: 21.5 10*3/uL — ABNORMAL HIGH (ref 1.7–7.7)
Neutrophils Relative %: 84 %
Other: 0 %
PLATELETS: 41 10*3/uL — AB (ref 150–400)
PROMYELOCYTES ABS: 0 %
RBC: 3.52 MIL/uL — ABNORMAL LOW (ref 3.87–5.11)
RDW: 20.8 % — ABNORMAL HIGH (ref 11.5–15.5)
WBC: 23.6 10*3/uL — ABNORMAL HIGH (ref 4.0–10.5)
nRBC: 0 /100 WBC

## 2016-11-06 LAB — COMPREHENSIVE METABOLIC PANEL
ALBUMIN: 3.6 g/dL (ref 3.5–5.0)
ALK PHOS: 211 U/L — AB (ref 38–126)
ALT: 9 U/L — ABNORMAL LOW (ref 14–54)
AST: 13 U/L — AB (ref 15–41)
Anion gap: 7 (ref 5–15)
BILIRUBIN TOTAL: 0.6 mg/dL (ref 0.3–1.2)
BUN: 9 mg/dL (ref 6–20)
CALCIUM: 8.7 mg/dL — AB (ref 8.9–10.3)
CO2: 27 mmol/L (ref 22–32)
Chloride: 106 mmol/L (ref 101–111)
Creatinine, Ser: 1.02 mg/dL — ABNORMAL HIGH (ref 0.44–1.00)
GFR calc Af Amer: >60 mL/min (ref >60)
GFR calc non Af Amer: 55 mL/min — ABNORMAL LOW (ref >60)
GLUCOSE: 102 mg/dL — AB (ref 65–99)
POTASSIUM: 3.4 mmol/L — AB (ref 3.5–5.1)
Sodium: 140 mmol/L (ref 135–145)
TOTAL PROTEIN: 6.6 g/dL (ref 6.5–8.1)

## 2016-11-06 LAB — I-STAT TROPONIN, ED: TROPONIN I, POC: 0 ng/mL (ref 0.00–0.08)

## 2016-11-06 LAB — LIPASE, BLOOD: LIPASE: 22 U/L (ref 11–51)

## 2016-11-06 MED ORDER — SODIUM CHLORIDE 0.9 % IV SOLN
Freq: Once | INTRAVENOUS | Status: AC
  Administered 2016-11-06: 15:00:00 via INTRAVENOUS

## 2016-11-06 MED ORDER — ONDANSETRON HCL 4 MG/2ML IJ SOLN
4.0000 mg | Freq: Once | INTRAMUSCULAR | Status: AC
  Administered 2016-11-06: 4 mg via INTRAVENOUS
  Filled 2016-11-06: qty 2

## 2016-11-06 MED ORDER — GADOBENATE DIMEGLUMINE 529 MG/ML IV SOLN
15.0000 mL | Freq: Once | INTRAVENOUS | Status: AC | PRN
  Administered 2016-11-06: 15 mL via INTRAVENOUS

## 2016-11-06 NOTE — ED Notes (Signed)
Bed: WA08 Expected date:  Expected time:  Means of arrival:  Comments: EMS/weakness 

## 2016-11-06 NOTE — ED Provider Notes (Signed)
[facility] DEPT Provider Note   CSN: [id number] Arrival date & time: 11/06/16  1222     History   Chief Complaint Chief Complaint  Patient presents with  . Nausea  . Emesis  . Weakness    HPI Lauren [name] is a 66 y.o. female.  HPI Patient was at [facility] getting a brain MRI done. After the procedure she reports she became very nauseated, weak and dizzy. She had a 3-4 episodes of vomiting. She denies she has a pain. She denies she's feeling dizzy this time. She reports she has been feeling just extremely fatigued. Past Medical History:  Diagnosis Date  . Abnormality of gait 09/10/2015  . Acute on chronic respiratory failure with hypoxemia ([place]) 07/11/2015  . Anemia due to chemotherapy 08/29/2014  . 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 Angina pectoris ([place]) 03/05/2016  . Arthritis   . ASCUS with positive high risk HPV 04/21/2012   colpo clinically normal 04/2012.  Given her other significant co morbidities (recurrent lung cancer, brain mets and colon cancer requiring chronic chemo) I don't think I would be very aggressive in pursuing any more paps. If she felt necessary, could do a repeat in one year, but the likelihood is that this is the least of her problems and I doubt she will benefit from repeated paps and colpos and I sincerely doubt anything we find would change the course of her life. We discussed. She has already decided to consider stopping chemo in May because after 5 years she is quite tired of it. I would always be willing to do a pap, but my recommendation is to stop.   . Back pain   . Benign paroxysmal positional vertigo 08/16/2015  . Bilateral hearing loss, Mild 02/27/2016   03/23/16 Hearing loss Evaluation by Unice Bailey, MD, [facility], Dx with mild bilateral hearing loss except normal hearing in right ear of 500 Hz and 1000 Hz.  Dr Redmond Baseman recommends annual hearing evaluations.   . Bilateral leg edema 10/17/2014  . Bilateral  pleural effusion 08/09/2013  . Blurred vision, bilateral 06/06/2014  . Brain metastases ([place]) 02/15/12  . Cerebral aneurysm   . CEREBRAL ANEURYSM 12/29/2006   Qualifier: Diagnosis of  By: Girard Cooter MD, MAKEECHA    . Chronic dermatitis of hands 02/27/2016  . Chronic folliculitis    of groin  . Colon cancer ([place]) 11/08  . COPD (chronic obstructive pulmonary disease) ([place])   . 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 Coronary artery disease   . Coronary atherosclerosis 09/09/2007   Qualifier: Diagnosis of  By: Martinique, Bonnie    . Depression   . Depressive disorder   . DEPRESSIVE DISORDER, MAJOR, RCR, MILD 12/29/2006   Qualifier: Diagnosis of  By: Girard Cooter MD, MAKEECHA    . Dyspnea 07/15/2015  . Encounter for antineoplastic chemotherapy 09/11/2015  . Encounter for therapeutic drug monitoring 09/11/2015  . Essential hypertension 12/29/2006   Echocardiogram (07/26/13): Left ventricle: The cavity size was normal. Wall thickness was increased in a pattern of mild LVH. Systolic function was normal. The estimated ejection fraction was in the range of 55% to 60%. Wall motion was normal; there were no regional wall motion abnormalities. Normal mitral valve.    . Falls frequently 08/02/2015  . Family history of Huntington's disease   . Family history of trichomonal vaginitis 05/2005  . Fatigue 02/06/2013  . 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 Fibrocystic breast changes   . GERD 12/29/2006  Qualifier: Diagnosis of  By: Girard Cooter MD, [place]    . GERD (gastroesophageal reflux disease)   . History of colon cancer 05/31/2013  . Hypercholesterolemia   . HYPERCHOLESTEROLEMIA 12/29/2006   Qualifier: Diagnosis of  By: Girard Cooter MD, MAKEECHA    . Hyperlipidemia   . Hypertension   . Hypokalemia 08/09/2013  . Insomnia 10/05/2011  . Left-sided low back pain with left-sided sciatica 07/18/2014  . Low back pain with sciatica 06/22/2014  . Lung cancer ([place]) 06/16/10   PET scan 04/28/2010; primary: increase  in size 02/2010 / Well Differentiated Adenocarcinoma of the lung   . Lung nodule    FNA ordered for 04/02/10 by HA>pos Ca  . LVH (left ventricular hypertrophy) 12/20/2014   Echocardiogram (07/26/13): Left ventricle: Wall thickness was increased in a pattern of mild LVH. The cavity size was normal. Systolic function was normal. The estimated ejection fraction was in the range of 55% to 60%. Wall motion was normal; there were no regional wall motion abnormalities. Normal mitral valve.   . On antineoplastic chemotherapy started 02/2012   [place]  . Other fatigue 11/28/2014  . Pleural effusion 05/03/2013  . 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 Postmenopausal   . POSTMENOPAUSAL SYNDROME 01/31/2009   Qualifier: Diagnosis of  By: Carlena Sax  MD, Colletta [place]    . Postural dizziness 06/19/2015  . Protein-calorie malnutrition, moderate ([place]) 09/11/2015  . S/P radiation therapy 03/01/12   SRS: 1 fraction / 20 Gray each to the Left Occipital Region and to the Right Insular Metastases  . Sensorineural hearing loss (SNHL) of both ears 04/23/2016   Dr Keturah Barre. Redmond Baseman (ENT) dx significant bilateral hearing loss and recommended biaural hearing aids.   . Shortness of breath   . Skin rash 10/03/2015  . Status post chemotherapy comp. 12/29/11   Carboplatin/Taxol  . Status post radiation therapy 11/16/11 - 12/29/11   Right Lung and Mediastinum: 60 Gy  . Status post stereotactic radiosurgery 01/2012   for Brain Metastases  . Tobacco dependence   . Vitamin D deficiency 08/30/2015  . Weight loss 10/22/2011    Patient Active Problem List   Diagnosis Date Noted  . Leukocytosis 08/21/2016  . Stress response 08/13/2016  . Acute bronchitis 06/25/2016  . Cancer associated pain 05/22/2016  . Pancytopenia, acquired ([place]) 05/01/2016  . Chronic kidney disease, stage III (moderate) 05/01/2016  . Sensorineural hearing loss (SNHL) of both ears 04/23/2016  . Osteoarthritis of right knee 04/23/2016  . Balance  problem 04/23/2016  . Impingement syndrome of left shoulder 04/23/2016  . Goals of care, counseling/discussion 04/16/2016  . Chronic dermatitis of hands 02/27/2016  . Abnormal mammogram of right breast 11/01/2015  . Encounter for chemotherapy management 09/11/2015  . Encounter for therapeutic drug monitoring 09/11/2015  . Encounter for antineoplastic chemotherapy 09/11/2015  . Vitamin D deficiency 08/30/2015  . Mild cognitive impairment 08/16/2015  . Falls frequently 08/02/2015  . COPD mixed type ([place]) 07/15/2015  . Protein calorie malnutrition ([place]) 03/14/2015  . LVH (left ventricular hypertrophy) 12/20/2014  . Blepharitis of both eyes 11/09/2014  . Anemia due to chemotherapy 08/29/2014  . Neuropathy due to chemotherapeutic drug ([place]) 07/19/2014  . History of colon cancer 05/31/2013  . Brain metastases treated with radiosurgery 02/26/2012  . Cancer of upper lobe of left lung, Adenocarcinoma   . TOBACCO ABUSE 10/04/2007  . Coronary atherosclerosis 09/09/2007  . HYPERCHOLESTEROLEMIA 12/29/2006  . DEPRESSIVE DISORDER, MAJOR, RCR, MILD 12/29/2006  . Essential hypertension 12/29/2006  .  CEREBRAL ANEURYSM 12/29/2006  . GERD 12/29/2006    Past Surgical History:  Procedure Laterality Date  . BACK SURGERY     Dr Luiz Ochoa  . BREAST SURGERY     Bil lumpectomy  . cardiac cath x3    . CHEST TUBE INSERTION Right 06/12/2013   Procedure: INSERTION PLEURAL DRAINAGE CATHETER;  Surgeon: Gaye Pollack, MD;  Location: [facility];  Service: Thoracic;  Laterality: Right;  . COLECTOMY  03/22/07   Stage 1 pT2 N0, M0 Adenocarcinoma of the sigmoid  colon  . HERNIA REPAIR    . LUNG LOBECTOMY  06/16/10   Left Upper Lobectomy  . MEDIASTINOSCOPY  10/19/2011   Procedure: MEDIASTINOSCOPY;  Surgeon: Gaye Pollack, MD;  Location: [facility];  Service: Thoracic;  Laterality: N/A;  . TALC PLEURODESIS Right 06/12/2013   Procedure: Pietro Cassis;  Surgeon: Gaye Pollack, MD;  Location: [facility];  Service: Thoracic;   Laterality: Right;  . TUBAL LIGATION    . TUNNELED VENOUS CATHETER PLACEMENT     Port-a-Cath    OB History    No data available       Home Medications    Prior to Admission medications   Medication Sig Start Date End Date Taking? Authorizing Provider  BYSTOLIC 10 MG tablet Take 10 mg by mouth daily.  06/16/16  Yes [provider]  diclofenac sodium (VOLTAREN) 1 % GEL Apply 2 g topically 4 (four) times daily as needed. 10/06/16  Yes Gorsuch, Ni, MD  HYDROmorphone (DILAUDID) 8 MG tablet Take 1 tablet (8 mg total) by mouth every 6 (six) hours as needed for severe pain. 08/21/16  Yes Gorsuch, Ni, MD  ipratropium (ATROVENT HFA) 17 MCG/ACT inhaler Inhale 2 puffs into the lungs every 4 (four) hours as needed for wheezing (Cough). 09/09/16  Yes Noralee Space, MD  lidocaine-prilocaine (EMLA) cream APPLY TOPICALLY AS NEEDED TO PORTA CATH SITE 1 HOUR PRIOR TO NEEDLE STICK. 10/06/16  Yes Gorsuch, Ni, MD  loratadine (CLARITIN) 10 MG tablet Take 10 mg by mouth daily.   Yes [provider]  nitroGLYCERIN (NITROSTAT) 0.4 MG SL tablet Place 1 tablet (0.4 mg total) under the tongue every 5 (five) minutes as needed. For chest pain 12/20/14  Yes McDiarmid, Blane Ohara, MD  omeprazole (PRILOSEC) 40 MG capsule Take 40 mg by mouth daily.  06/16/16  Yes [provider]  ondansetron (ZOFRAN ODT) 4 MG disintegrating tablet Take 1 tablet (4 mg total) by mouth every 8 (eight) hours as needed for nausea or vomiting. 04/23/16  Yes McDiarmid, Blane Ohara, MD  pravastatin (PRAVACHOL) 40 MG tablet Take 40 mg by mouth daily.  06/16/16  Yes [provider]  umeclidinium-vilanterol (ANORO ELLIPTA) 62.5-25 MCG/INH AEPB Inhale 1 puff into the lungs daily. 09/09/16  Yes Noralee Space, MD  VENTOLIN HFA 108 [phone] Base) MCG/ACT inhaler Inhale 2 puffs into the lungs every 4 (four) hours as needed for wheezing or shortness of breath. 09/09/16  Yes Noralee Space, MD    Family History Family History  Problem  Relation Age of Onset  . Stomach cancer Maternal Aunt   . Osteoarthritis Father   . Gout Father   . Hypertension Father   . Heart disease Mother        pericarditis;   . Breast cancer Cousin   . Cancer Sister        Lymphatic  . Huntington's disease Son   . Huntington's disease Son   . Anesthesia problems Neg Hx  Social History Social History  Substance Use Topics  . Smoking status: Current Every Day Smoker    Packs/day: 0.75    Types: Cigarettes    Start date: 05/18/1965  . Smokeless tobacco: Never Used  . Alcohol use 0.0 oz/week     Comment: occasional     Allergies   Adhesive [tape] and Lisinopril   Review of Systems Review of Systems 10 Systems reviewed and are negative for acute change except as noted in the HPI.   Physical Exam Updated Vital Signs BP (!) 145/68 (BP Location: Left Arm)   Pulse 92   Temp 98.2 F (36.8 C) (Oral)   Resp 19   SpO2 100%   Physical Exam  Constitutional: She is oriented to person, place, and time. She appears well-developed and well-nourished. No distress.  Patient appears fatigued. She is nontoxic however no respiratory distress and no confusion.  HENT:  Head: Normocephalic and atraumatic.  Eyes: Conjunctivae and EOM are normal. Pupils are equal, round, and reactive to light.  Neck: Neck supple.  Cardiovascular: Normal rate, regular rhythm, normal heart sounds and intact distal pulses.   No murmur heard. Pulmonary/Chest: Effort normal and breath sounds normal. No respiratory distress.  Abdominal: Soft. She exhibits no distension. There is no tenderness.  Musculoskeletal: She exhibits no edema.  Patient has swelling of the left ankle. Since chronic. No calf tenderness and no lower extremity skin changes or wounds.  Neurological: She is alert and oriented to person, place, and time. No cranial nerve deficit. She exhibits normal muscle tone. Coordination normal.  Skin: Skin is warm and dry.  Psychiatric: She has a normal mood  and affect.  Nursing note and vitals reviewed.    ED Treatments / Results  Labs (all labs ordered are listed, but only abnormal results are displayed) Labs Reviewed  COMPREHENSIVE METABOLIC PANEL - Abnormal; Notable for the following:       Result Value   Potassium 3.4 (*)    Glucose, Bld 102 (*)    Creatinine, Ser 1.02 (*)    Calcium 8.7 (*)    AST 13 (*)    ALT 9 (*)    Alkaline Phosphatase 211 (*)    GFR calc non Af Amer 55 (*)    All other components within normal limits  CBC WITH DIFFERENTIAL/PLATELET - Abnormal; Notable for the following:    WBC 23.6 (*)    RBC 3.52 (*)    Hemoglobin 9.4 (*)    HCT 30.3 (*)    RDW 20.8 (*)    Platelets 41 (*)    Neutro Abs 21.5 (*)    All other components within normal limits  LIPASE, BLOOD  I-STAT TROPOININ, ED    EKG  EKG Interpretation None       Radiology Mr Jeri Cos Wo Contrast  Result Date: 11/06/2016 CLINICAL DATA:  Metastatic lung cancer status post [facility] treatment. EXAM: MRI HEAD WITHOUT AND WITH CONTRAST TECHNIQUE: Multiplanar, multiecho pulse sequences of the brain and surrounding structures were obtained without and with intravenous contrast. CONTRAST:  52mL MULTIHANCE GADOBENATE DIMEGLUMINE 529 MG/ML IV SOLN COMPARISON:  Multiple priors, most recent 04/30/2016. FINDINGS: Brain: Stable appearance to the previously identified treated metastases, one in the RIGHT insula measuring 2 mm, the other in the LEFT occipital cortex measuring 2 x 3 mm. No new lesions. Mild atrophy with chronic microvascular ischemic change. Vascular: Normal flow voids. Skull and upper cervical spine: Unremarkable. Sinuses/Orbits: No significant or layering fluid.  Negative orbits. Other: BILATERAL  LEFT greater than RIGHT mastoid effusions are stable. No nasopharyngeal process is evident. IMPRESSION: Stable treated RIGHT insula and LEFT occipital metastases. Electronically Signed   By: Staci Righter M.D.   On: 11/06/2016 13:03     Procedures Procedures (including critical care time)  Medications Ordered in ED Medications  0.9 %  sodium chloride infusion ( Intravenous New Bag/Given 11/06/16 1433)  ondansetron (ZOFRAN) injection 4 mg (4 mg Intravenous Given 11/06/16 1440)     Initial Impression / Assessment and Plan / ED Course  I have reviewed the triage vital signs and the nursing notes.  Pertinent labs & imaging results that were available during my care of the patient were reviewed by me and considered in my medical decision making (see chart for details).     Recheck 16:15 patient is alert and nontoxic. She has had a sandwich and a beverage. She feels much improved.  Final Clinical Impressions(s) / ED Diagnoses   Final diagnoses:  Generalized weakness  Metastatic adenocarcinoma ([place])   Patient history is generalized weakness and dizziness after having had a brain MRI. Patient has known history of metastatic adenocarcinoma and gets chemotherapy. At this time feel she is stable for continued outpatient management. She does not have fever or other focal symptoms. Symptoms resolved with fluids. She is now sitting up, well in appearance having a sandwich and a beverage. New Prescriptions New Prescriptions   No medications on file     Charlesetta Shanks, MD 11/06/16 [phone]

## 2016-11-06 NOTE — ED Triage Notes (Addendum)
Per EMS, pt felt weak and nauseas while at [facility] imaging. Pt had 3-4 bouts of emesis prior to arrival. Pt denies pain. Pt was getting brain MRI.   Pt has hx of lung cancer.

## 2016-11-09 NOTE — Telephone Encounter (Signed)
This encounter was created in error - please disregard.

## 2016-11-10 ENCOUNTER — Other Ambulatory Visit: Payer: Self-pay | Admitting: Hematology and Oncology

## 2016-11-11 ENCOUNTER — Encounter: Payer: Self-pay | Admitting: Radiation Oncology

## 2016-11-11 ENCOUNTER — Ambulatory Visit
Admission: RE | Admit: 2016-11-11 | Discharge: 2016-11-11 | Disposition: A | Discharge status: Home / Self Care | Payer: Medicare HMO | Source: Ambulatory Visit | Attending: Radiation Oncology | Admitting: Radiation Oncology

## 2016-11-11 VITALS — BP 153/85 | HR 94 | Temp 98.4°F | Ht 64.0 in | Wt 156.0 lb

## 2016-11-11 DIAGNOSIS — R5383 Other fatigue: Secondary | ICD-10-CM | POA: Insufficient documentation

## 2016-11-11 DIAGNOSIS — C7949 Secondary malignant neoplasm of other parts of nervous system: Secondary | ICD-10-CM | POA: Insufficient documentation

## 2016-11-11 DIAGNOSIS — R42 Dizziness and giddiness: Secondary | ICD-10-CM | POA: Diagnosis not present

## 2016-11-11 DIAGNOSIS — C3490 Malignant neoplasm of unspecified part of unspecified bronchus or lung: Secondary | ICD-10-CM | POA: Insufficient documentation

## 2016-11-11 DIAGNOSIS — C3412 Malignant neoplasm of upper lobe, left bronchus or lung: Secondary | ICD-10-CM

## 2016-11-11 DIAGNOSIS — C7931 Secondary malignant neoplasm of brain: Secondary | ICD-10-CM | POA: Insufficient documentation

## 2016-11-11 DIAGNOSIS — R911 Solitary pulmonary nodule: Secondary | ICD-10-CM | POA: Diagnosis not present

## 2016-11-11 DIAGNOSIS — Z85841 Personal history of malignant neoplasm of brain: Secondary | ICD-10-CM | POA: Diagnosis not present

## 2016-11-11 DIAGNOSIS — Z08 Encounter for follow-up examination after completed treatment for malignant neoplasm: Secondary | ICD-10-CM | POA: Diagnosis not present

## 2016-11-11 NOTE — Progress Notes (Signed)
Radiation Oncology         [phone] ________________________________  Name: Lauren [name] MRN: [id number]  Date: 11/11/2016  DOB: 1950-11-21  Follow-Up Visit Note  Outpatient  CC: McDiarmid, Blane Ohara, MD  Cletus Gash, MD   Diagnosis and Prior Radiotherapy: Metastatic non-small cell lung cancer, adenocarcinoma, with 2 brain metastases. Left occipital and right insular metastases  03/01/12 : Left Occipital 15 mm target and Right Insula 4 mm target treated to 20 Gy   ICD-10-CM   1. Brain metastases treated with radiosurgery C79.31    C79.49   2. Cancer of upper lobe of left lung (HCC) C34.12      Interval Since Last Radiation: 4 years 8 months  Narrative: The patient presenting for her routine follow-up appointment of [place] radiation to her left occipital completed 03/01/12.  Recent Brain MRI on 11/06/16 showed stable treated right insula and left occipital metastases.   On review of systems, the patient denies pain. She reports she feels fatigued and weak. She reports dizziness, and describes this as a "drunk" feeling, causing difficulty with her balance. She uses her walker to ambulate. She reports she is "going downhill." She reports a decreased appetite. The patient reports she is "planning for her future," and is giving away items. She reports Dr. Alvy Bimler told her in February that she had 6-12 months to live, and this caused her family significant distress but she appreciated the information/candor.  Dr. Alvy Bimler continues to follow her in medical oncology. Her recent chest CT in May showed overall stability on systemic therapy. She has mild enlargement of a 2.5 cm central right lower lobe pulmonary nodule. Dr. Alvy Bimler and I have discussed this nodule and she will continue to observe it, but she knows that radiation therapy can be given in the future for local control if warranted.  The patient will follow up with Dr. Alvy Bimler on 11/30/16.   ALLERGIES:  is allergic to  adhesive [tape] and lisinopril.  Meds: Current Outpatient Prescriptions  Medication Sig Dispense Refill  . BYSTOLIC 10 MG tablet Take 10 mg by mouth daily.     . diclofenac sodium (VOLTAREN) 1 % GEL Apply 2 g topically 4 (four) times daily as needed. 100 g 1  . HYDROmorphone (DILAUDID) 8 MG tablet Take 1 tablet (8 mg total) by mouth every 6 (six) hours as needed for severe pain. 60 tablet 0  . ipratropium (ATROVENT HFA) 17 MCG/ACT inhaler Inhale 2 puffs into the lungs every 4 (four) hours as needed for wheezing (Cough). 3 Inhaler 3  . lidocaine-prilocaine (EMLA) cream APPLY TOPICALLY AS NEEDED TO PORTA CATH SITE 1 HOUR PRIOR TO NEEDLE STICK. 30 g 0  . loratadine (CLARITIN) 10 MG tablet Take 10 mg by mouth daily.    . nitroGLYCERIN (NITROSTAT) 0.4 MG SL tablet Place 1 tablet (0.4 mg total) under the tongue every 5 (five) minutes as needed. For chest pain 25 tablet 5  . omeprazole (PRILOSEC) 40 MG capsule Take 40 mg by mouth daily.     . ondansetron (ZOFRAN ODT) 4 MG disintegrating tablet Take 1 tablet (4 mg total) by mouth every 8 (eight) hours as needed for nausea or vomiting. 20 tablet 0  . pravastatin (PRAVACHOL) 40 MG tablet Take 40 mg by mouth daily.     Marland Kitchen umeclidinium-vilanterol (ANORO ELLIPTA) 62.5-25 MCG/INH AEPB Inhale 1 puff into the lungs daily. 3 each 3  . VENTOLIN HFA 108 (90 Base) MCG/ACT inhaler Inhale 2 puffs into the lungs every 4 (  four) hours as needed for wheezing or shortness of breath. 3 Inhaler 3   No current facility-administered medications for this encounter.    Facility-Administered Medications Ordered in Other Encounters  Medication Dose Route Frequency Provider Last Rate Last Dose  . sodium chloride 0.9 % injection 10 mL  10 mL Intravenous PRN Alvy Bimler, Ni, MD   10 mL at 09/18/15 1345  . sodium chloride 0.9 % injection 10 mL  10 mL Intravenous PRN Heath Lark, MD   10 mL at 04/24/16 1136    Physical Findings: The patient is in no acute distress. Patient is alert  and oriented.     height is 5\' 4"  (1.626 m) and weight is 156 lb (70.8 kg). Her temperature is 98.4 F (36.9 C). Her blood pressure is 153/85 (abnormal) and her pulse is 94. Her oxygen saturation is 100%.  General: Alert and oriented, in no acute distress. HEENT: Head is normocephalic. Oropharynx and oral cavity are clear. Neck: Neck is supple, no palpable cervical or supraclavicular lymphadenopathy or masses. Heart: Regular in rate and rhythm with no murmurs. Chest: I asked her to take shallow breaths because deep breaths cause a cough; with shallow breaths her lungs sound clear. Abdomen: Soft, non tender, non distended. Lymphatics: see Neck Exam Extremities: No edema in wrists, mild edema in lower extremities. Musculoskeletal: 4-/5 strength in the left hip and 4+/5 in the right hip. Strength intact in upper extremities. Uses walker. Neurologic: EOMI. Finger to nose testing is intact. No obvious focalities. Speech is fluent. Coordination is intact. Psychiatric: Judgment and insight are intact. Affect is appropriate.  Lab Findings: Lab Results  Component Value Date   WBC 23.6 (H) 11/06/2016   HGB 9.4 (L) 11/06/2016   HCT 30.3 (L) 11/06/2016   MCV 86.1 11/06/2016   PLT 41 (L) 11/06/2016     CBC    Component Value Date/Time   WBC 23.6 (H) 11/06/2016 1437   RBC 3.52 (L) 11/06/2016 1437   HGB 9.4 (L) 11/06/2016 1437   HGB 9.5 (L) 10/30/2016 1212   HCT 30.3 (L) 11/06/2016 1437   HCT 30.2 (L) 10/30/2016 1212   PLT 41 (L) 11/06/2016 1437   PLT 200 10/30/2016 1212   MCV 86.1 11/06/2016 1437   MCV 84.9 10/30/2016 1212   MCH 26.7 11/06/2016 1437   MCHC 31.0 11/06/2016 1437   RDW 20.8 (H) 11/06/2016 1437   RDW 22.8 (H) 10/30/2016 1212   LYMPHSABS 1.4 11/06/2016 1437   LYMPHSABS 0.6 (L) 10/30/2016 1212   MONOABS 0.7 11/06/2016 1437   MONOABS 0.4 10/30/2016 1212   EOSABS 0.0 11/06/2016 1437   EOSABS 0.0 10/30/2016 1212   BASOSABS 0.0 11/06/2016 1437   BASOSABS 0.0 10/30/2016 1212      Radiographic Findings: As above  Impression/Plan: No evidence of disease progression per the brain.   I will present this patient at ENT tumor board for discussion of the patient's recent dizziness; she reports she feels "drunk" and has difficulty walking occasionally- note mastoid effusions on MRI.  I encouraged the patient that her recent scans looked favorable, and she should continue to enjoy her life and maintain a positive outlook.  MRI brain and follow-up in 9 months. The patient will return to the clinic sooner as needed. She will continue to follow with Dr. Alvy Bimler as indicated.  I spent 25 minutes face to face with the patient and more than 50% of that time was spent in counseling and/or coordination of care.  _________________________________   [name]  Isidore Moos, MD  This document serves as a record of services personally performed by Eppie Gibson, MD. It was created on her behalf by Maryla Morrow, a trained medical scribe. The creation of this record is based on the scribe's personal observations and the provider's statements to them. This document has been checked and approved by the attending provider.

## 2016-11-11 NOTE — Progress Notes (Signed)
Lauren [name] presents for follow up of radiation completed 03/01/12 to her Brain. She denies pain. She appears slightly depressed. She is planning her future, and giving away items. She has a decreased appetite. She is receiving chemotherapy infusions ordered by Dr. Alvy Bimler. She will see Dr. Alvy Bimler next on 11/30/16. She had an MRI Brain on 11/06/16.  BP (!) 153/85   Pulse 94   Temp 98.4 F (36.9 C)   Ht 5\' 4"  (1.626 m)   Wt 156 lb (70.8 kg)   SpO2 100% Comment: room air  BMI 26.78 kg/m    Wt Readings from Last 3 Encounters:  11/11/16 156 lb (70.8 kg)  10/23/16 163 lb 1.6 oz (74 kg)  10/15/16 162 lb (73.5 kg)

## 2016-11-13 ENCOUNTER — Other Ambulatory Visit (HOSPITAL_BASED_OUTPATIENT_CLINIC_OR_DEPARTMENT_OTHER): Payer: Medicare HMO

## 2016-11-13 ENCOUNTER — Ambulatory Visit (HOSPITAL_BASED_OUTPATIENT_CLINIC_OR_DEPARTMENT_OTHER): Payer: Medicare HMO

## 2016-11-13 ENCOUNTER — Ambulatory Visit: Payer: Medicare HMO

## 2016-11-13 VITALS — BP 152/80 | HR 89 | Temp 97.8°F | Resp 18

## 2016-11-13 DIAGNOSIS — C3412 Malignant neoplasm of upper lobe, left bronchus or lung: Secondary | ICD-10-CM

## 2016-11-13 DIAGNOSIS — C7949 Secondary malignant neoplasm of other parts of nervous system: Secondary | ICD-10-CM

## 2016-11-13 DIAGNOSIS — C7931 Secondary malignant neoplasm of brain: Secondary | ICD-10-CM | POA: Diagnosis not present

## 2016-11-13 DIAGNOSIS — Z5111 Encounter for antineoplastic chemotherapy: Secondary | ICD-10-CM

## 2016-11-13 LAB — CBC WITH DIFFERENTIAL/PLATELET
BASO%: 0.3 % (ref 0.0–2.0)
Basophils Absolute: 0.1 10*3/uL (ref 0.0–0.1)
EOS%: 0.2 % (ref 0.0–7.0)
Eosinophils Absolute: 0 10*3/uL (ref 0.0–0.5)
HEMATOCRIT: 32.2 % — AB (ref 34.8–46.6)
HGB: 9.9 g/dL — ABNORMAL LOW (ref 11.6–15.9)
LYMPH#: 1 10*3/uL (ref 0.9–3.3)
LYMPH%: 5.9 % — ABNORMAL LOW (ref 14.0–49.7)
MCH: 26.7 pg (ref 25.1–34.0)
MCHC: 30.8 g/dL — ABNORMAL LOW (ref 31.5–36.0)
MCV: 86.7 fL (ref 79.5–101.0)
MONO#: 1.1 10*3/uL — ABNORMAL HIGH (ref 0.1–0.9)
MONO%: 6.1 % (ref 0.0–14.0)
NEUT#: 15.5 10*3/uL — ABNORMAL HIGH (ref 1.5–6.5)
NEUT%: 87.5 % — ABNORMAL HIGH (ref 38.4–76.8)
Platelets: 152 10*3/uL (ref 145–400)
RBC: 3.72 10*6/uL (ref 3.70–5.45)
RDW: 24.4 % — AB (ref 11.2–14.5)
WBC: 17.7 10*3/uL — ABNORMAL HIGH (ref 3.9–10.3)

## 2016-11-13 LAB — COMPREHENSIVE METABOLIC PANEL
ALBUMIN: 3.1 g/dL — AB (ref 3.5–5.0)
AST: 11 U/L (ref 5–34)
Alkaline Phosphatase: 228 U/L — ABNORMAL HIGH (ref 40–150)
Anion Gap: 8 mEq/L (ref 3–11)
BUN: 11.7 mg/dL (ref 7.0–26.0)
CALCIUM: 9 mg/dL (ref 8.4–10.4)
CO2: 26 mEq/L (ref 22–29)
CREATININE: 1.1 mg/dL (ref 0.6–1.1)
Chloride: 109 mEq/L (ref 98–109)
EGFR: 60 mL/min/{1.73_m2} — ABNORMAL LOW (ref >90)
Glucose: 90 mg/dl (ref 70–140)
Potassium: 3.9 mEq/L (ref 3.5–5.1)
Sodium: 143 mEq/L (ref 136–145)
Total Bilirubin: 0.37 mg/dL (ref 0.20–1.20)
Total Protein: 6.5 g/dL (ref 6.4–8.3)

## 2016-11-13 MED ORDER — PALONOSETRON HCL INJECTION 0.25 MG/5ML
INTRAVENOUS | Status: AC
  Filled 2016-11-13: qty 5

## 2016-11-13 MED ORDER — SODIUM CHLORIDE 0.9 % IV SOLN
Freq: Once | INTRAVENOUS | Status: AC
  Administered 2016-11-13: 11:00:00 via INTRAVENOUS

## 2016-11-13 MED ORDER — SODIUM CHLORIDE 0.9 % IV SOLN
134.5600 mg | Freq: Once | INTRAVENOUS | Status: AC
  Administered 2016-11-13: 130 mg via INTRAVENOUS
  Filled 2016-11-13: qty 13

## 2016-11-13 MED ORDER — SODIUM CHLORIDE 0.9 % IV SOLN
Freq: Once | INTRAVENOUS | Status: AC
  Administered 2016-11-13: 11:00:00 via INTRAVENOUS
  Filled 2016-11-13: qty 5

## 2016-11-13 MED ORDER — SODIUM CHLORIDE 0.9% FLUSH
10.0000 mL | INTRAVENOUS | Status: DC | PRN
  Administered 2016-11-13: 10 mL
  Filled 2016-11-13: qty 10

## 2016-11-13 MED ORDER — CARBOPLATIN CHEMO INTRADERMAL TEST DOSE 100MCG/0.02ML
100.0000 ug | Freq: Once | INTRADERMAL | Status: AC
  Administered 2016-11-13: 100 ug via INTRADERMAL
  Filled 2016-11-13: qty 0.02

## 2016-11-13 MED ORDER — HEPARIN SOD (PORK) LOCK FLUSH 100 UNIT/ML IV SOLN
500.0000 [IU] | Freq: Once | INTRAVENOUS | Status: AC | PRN
  Administered 2016-11-13: 500 [IU]
  Filled 2016-11-13: qty 5

## 2016-11-13 MED ORDER — PALONOSETRON HCL INJECTION 0.25 MG/5ML
0.2500 mg | Freq: Once | INTRAVENOUS | Status: AC
  Administered 2016-11-13: 0.25 mg via INTRAVENOUS

## 2016-11-13 MED ORDER — SODIUM CHLORIDE 0.9 % IV SOLN
500.0000 mg/m2 | Freq: Once | INTRAVENOUS | Status: AC
  Administered 2016-11-13: 912 mg via INTRAVENOUS
  Filled 2016-11-13: qty 23.99

## 2016-11-13 NOTE — Patient Instructions (Signed)
[facility] Discharge Instructions for Patients Receiving Chemotherapy  Today you received the following chemotherapy agents: Gemzar and Carboplatin   To help prevent nausea and vomiting after your treatment, we encourage you to take your nausea medication as directed.    If you develop nausea and vomiting that is not controlled by your nausea medication, call the clinic.   BELOW ARE SYMPTOMS THAT SHOULD BE REPORTED IMMEDIATELY:  *FEVER GREATER THAN 100.5 F  *CHILLS WITH OR WITHOUT FEVER  NAUSEA AND VOMITING THAT IS NOT CONTROLLED WITH YOUR NAUSEA MEDICATION  *UNUSUAL SHORTNESS OF BREATH  *UNUSUAL BRUISING OR BLEEDING  TENDERNESS IN MOUTH AND THROAT WITH OR WITHOUT PRESENCE OF ULCERS  *URINARY PROBLEMS  *BOWEL PROBLEMS  UNUSUAL RASH Items with * indicate a potential emergency and should be followed up as soon as possible.  Feel free to call the clinic you have any questions or concerns. The clinic phone number is [phone].  Please show the [facility] at check-in to the Emergency Department and triage nurse.

## 2016-11-16 ENCOUNTER — Telehealth: Payer: Self-pay

## 2016-11-16 NOTE — Telephone Encounter (Signed)
Ms. Yarberry called me today to see if an appointment had been scheduled with an ENT yet. She stated that Dr. Isidore Moos stated at her follow up that we would get her scheduled to see an ENT to evaluate MRI findings. I informed her of Dr. Pearlie Oyster documentation that they would be discussing her MRI at the next ENT tumor board, and that we would contact her with any further work up if needed. She voiced her understanding and knows to call me if she has any further questions.

## 2016-11-17 ENCOUNTER — Encounter: Payer: Self-pay | Admitting: *Deleted

## 2016-11-17 DIAGNOSIS — J441 Chronic obstructive pulmonary disease with (acute) exacerbation: Secondary | ICD-10-CM

## 2016-11-17 NOTE — Patient Outreach (Signed)
[facility]) Care Management  [place]  11/17/2016   Lauren [name] 09-15-1950 [id number] [place] received return  telephone call from patient.  Hip[employer] compliance verified. Per patient she feels so fatigued that sometimes it takes all she has to get up and heat something to eat. Patient goes to pulmonologist on Fri. Patient is still currently smoking. Patient has a continuous cough. Per patient clear phlegm.  Per patient at time she doesn't feel like taking her medications. RN Health Coach explained the importance of medication adherence. Patient is using the [employer] bus for transportation. Her car has broken down. Per patient she now has to depend on others to take her to the grocery store. Patient son that has [place] is staying with her. Placement has been found for him to move to a [place] facility. Per patient she has a living will. She is a DNR . Per patient the doctors has a copy and her daughter took a picture and has on her phone. Patient has agreed to further outreach calls.  Encounter Medications:  Outpatient Encounter Prescriptions as of 11/17/2016  Medication Sig Note  . BYSTOLIC 10 MG tablet Take 10 mg by mouth daily.  10/16/2016: Taking few times a week  . diclofenac sodium (VOLTAREN) 1 % GEL Apply 2 g topically 4 (four) times daily as needed.   Marland Kitchen HYDROmorphone (DILAUDID) 8 MG tablet Take 1 tablet (8 mg total) by mouth every 6 (six) hours as needed for severe pain. 10/16/2016: Taking a few times a week  . ipratropium (ATROVENT HFA) 17 MCG/ACT inhaler Inhale 2 puffs into the lungs every 4 (four) hours as needed for wheezing (Cough). 10/16/2016: Taking a few times a week  . lidocaine-prilocaine (EMLA) cream APPLY TOPICALLY AS NEEDED TO PORTA CATH SITE 1 HOUR PRIOR TO NEEDLE STICK.   Marland Kitchen loratadine (CLARITIN) 10 MG tablet Take 10 mg by mouth daily.   . nitroGLYCERIN (NITROSTAT) 0.4 MG SL tablet Place 1 tablet (0.4 mg total) under the tongue every 5  (five) minutes as needed. For chest pain 08/29/2016: Has, but has not needed   . omeprazole (PRILOSEC) 40 MG capsule Take 40 mg by mouth daily.  10/16/2016: Taking a few times a week  . ondansetron (ZOFRAN ODT) 4 MG disintegrating tablet Take 1 tablet (4 mg total) by mouth every 8 (eight) hours as needed for nausea or vomiting.   . pravastatin (PRAVACHOL) 40 MG tablet Take 40 mg by mouth daily.  10/16/2016: Taking a few times a week  . umeclidinium-vilanterol (ANORO ELLIPTA) 62.5-25 MCG/INH AEPB Inhale 1 puff into the lungs daily. 10/16/2016: Taking a few times a week  . VENTOLIN HFA 108 (90 Base) MCG/ACT inhaler Inhale 2 puffs into the lungs every 4 (four) hours as needed for wheezing or shortness of breath. 10/16/2016: Taking a few times a week   Facility-Administered Encounter Medications as of 11/17/2016  Medication  . sodium chloride 0.9 % injection 10 mL  . sodium chloride 0.9 % injection 10 mL    Functional Status:  In your present state of health, do you have any difficulty performing the following activities: 09/30/2016 08/31/2016  Hearing? [name]  Vision? Y Y  Difficulty concentrating or making decisions? [name]  Walking or climbing stairs? Y Y  Dressing or bathing? N N  Doing errands, shopping? [name]  Preparing Food and eating ? N N  Using the Toilet? N N  In the past six months, have you accidently leaked  urine? Y Y  Do you have problems with loss of bowel control? N N  Managing your Medications? N N  Managing your Finances? N N  Housekeeping or managing your Housekeeping? Y Y  Some recent data might be hidden    Fall/Depression Screening: Fall Risk  11/11/2016 10/15/2016 09/30/2016  Falls in the past year? Yes Yes Yes  Number falls in past yr: 1 2 or more 2 or more  Injury with Fall? No No No  Risk Factor Category  - High Fall Risk High Fall Risk  Risk for fall due to : History of fall(s) - History of fall(s);Impaired balance/gait  Risk for fall due to (comments): - - -  Follow up - Falls  prevention discussed Falls evaluation completed;Education provided;Falls prevention discussed   PHQ 2/9 Scores 11/11/2016 10/15/2016 09/30/2016 08/31/2016 08/13/2016 07/31/2016 07/03/2016  PHQ - 2 Score 0 0 1 1 0 0 0  PHQ- 9 Score - - - - - - -  Exception Documentation - - - - - - -   THN CM Care Plan Problem One     Most Recent Value  Care Plan Problem One  knowledge deficit in self management of COPD  Role Documenting the Problem One  [place] for Problem One  Active  THN Long Term Goal   Patient will not have any readmissions for COPD within the next 90 days  THN Long Term Goal Start Date  11/17/16  Interventions for Problem One Long Term Goal  RN reminded patient to keep appointments with PCP and oncology and pulmonary specialist. RN reminded patient to take medications as per order.  THN CM Short Term Goal #1   Patient will report better nutritional intake within the next 30 days  [facility]N CM Short Term Goal #1 Start Date  11/17/16  Interventions for Short Term Goal #1  RN discussed with patient about eating small meals and snacks. RN discussed with patient about drinking more nutritional supplements. RN will referr to Education officer, museum to look at meals on wheels for this patient  [facility] CM Short Term Goal #5   Patient will verbalize witin the next 30 days that she is getting in into a routine exercise program  [facility] CM Short Term Goal #5 Start Date  11/17/16  Interventions for Short Term Goal #5  RN sent patient a list of chair exercises with pictures. RN discussed with patient the importance of building up her strength. RN discusses with patient about taking short walks       Assessment:  Patient is not eating properly Patient is excessively fatigued Patient is not adhering to medications Patient has a living will Patient is currently smoking  Plan:  Patient will continue to benefit from [place] telephonic outreach for education and support for COPD self management. RN  discussed medication adherence RN referred to Sports administrator discussed living will and DNR RN will follow up outreach in August for further discussion and [place] Management [phone]

## 2016-11-20 ENCOUNTER — Telehealth: Payer: Self-pay | Admitting: Hematology and Oncology

## 2016-11-20 ENCOUNTER — Ambulatory Visit (HOSPITAL_BASED_OUTPATIENT_CLINIC_OR_DEPARTMENT_OTHER): Payer: Medicare HMO | Admitting: Hematology and Oncology

## 2016-11-20 ENCOUNTER — Other Ambulatory Visit (HOSPITAL_BASED_OUTPATIENT_CLINIC_OR_DEPARTMENT_OTHER): Payer: Medicare HMO

## 2016-11-20 ENCOUNTER — Ambulatory Visit (HOSPITAL_BASED_OUTPATIENT_CLINIC_OR_DEPARTMENT_OTHER): Payer: Medicare HMO

## 2016-11-20 ENCOUNTER — Ambulatory Visit: Payer: Medicare HMO

## 2016-11-20 VITALS — BP 138/89 | HR 82 | Temp 97.8°F | Resp 16 | Ht 64.0 in | Wt 159.0 lb

## 2016-11-20 DIAGNOSIS — D61818 Other pancytopenia: Secondary | ICD-10-CM | POA: Diagnosis not present

## 2016-11-20 DIAGNOSIS — C3412 Malignant neoplasm of upper lobe, left bronchus or lung: Secondary | ICD-10-CM

## 2016-11-20 DIAGNOSIS — C7931 Secondary malignant neoplasm of brain: Secondary | ICD-10-CM

## 2016-11-20 DIAGNOSIS — Z923 Personal history of irradiation: Secondary | ICD-10-CM | POA: Diagnosis not present

## 2016-11-20 DIAGNOSIS — C7949 Secondary malignant neoplasm of other parts of nervous system: Principal | ICD-10-CM

## 2016-11-20 DIAGNOSIS — T451X5A Adverse effect of antineoplastic and immunosuppressive drugs, initial encounter: Secondary | ICD-10-CM

## 2016-11-20 DIAGNOSIS — G620 Drug-induced polyneuropathy: Secondary | ICD-10-CM

## 2016-11-20 DIAGNOSIS — Z5111 Encounter for antineoplastic chemotherapy: Secondary | ICD-10-CM

## 2016-11-20 DIAGNOSIS — Z720 Tobacco use: Secondary | ICD-10-CM

## 2016-11-20 DIAGNOSIS — Z5189 Encounter for other specified aftercare: Secondary | ICD-10-CM

## 2016-11-20 DIAGNOSIS — F17200 Nicotine dependence, unspecified, uncomplicated: Secondary | ICD-10-CM

## 2016-11-20 LAB — CBC WITH DIFFERENTIAL/PLATELET
BASO%: 0.6 % (ref 0.0–2.0)
BASOS ABS: 0 10*3/uL (ref 0.0–0.1)
EOS ABS: 0 10*3/uL (ref 0.0–0.5)
EOS%: 0.3 % (ref 0.0–7.0)
HCT: 30.3 % — ABNORMAL LOW (ref 34.8–46.6)
HGB: 9.5 g/dL — ABNORMAL LOW (ref 11.6–15.9)
LYMPH%: 9.2 % — AB (ref 14.0–49.7)
MCH: 27.2 pg (ref 25.1–34.0)
MCHC: 31.5 g/dL (ref 31.5–36.0)
MCV: 86.4 fL (ref 79.5–101.0)
MONO#: 0.6 10*3/uL (ref 0.1–0.9)
MONO%: 7.9 % (ref 0.0–14.0)
NEUT%: 82 % — ABNORMAL HIGH (ref 38.4–76.8)
NEUTROS ABS: 5.7 10*3/uL (ref 1.5–6.5)
Platelets: 144 10*3/uL — ABNORMAL LOW (ref 145–400)
RBC: 3.51 10*6/uL — AB (ref 3.70–5.45)
RDW: 23.2 % — ABNORMAL HIGH (ref 11.2–14.5)
WBC: 7 10*3/uL (ref 3.9–10.3)
lymph#: 0.6 10*3/uL — ABNORMAL LOW (ref 0.9–3.3)

## 2016-11-20 LAB — COMPREHENSIVE METABOLIC PANEL
ALT: 7 U/L (ref 0–55)
AST: 11 U/L (ref 5–34)
Albumin: 3.2 g/dL — ABNORMAL LOW (ref 3.5–5.0)
Alkaline Phosphatase: 158 U/L — ABNORMAL HIGH (ref 40–150)
Anion Gap: 6 mEq/L (ref 3–11)
BILIRUBIN TOTAL: 0.33 mg/dL (ref 0.20–1.20)
BUN: 13.4 mg/dL (ref 7.0–26.0)
CO2: 26 meq/L (ref 22–29)
Calcium: 9.1 mg/dL (ref 8.4–10.4)
Chloride: 108 mEq/L (ref 98–109)
Creatinine: 1 mg/dL (ref 0.6–1.1)
EGFR: 67 mL/min/{1.73_m2} — AB (ref >90)
Glucose: 94 mg/dl (ref 70–140)
POTASSIUM: 4.3 meq/L (ref 3.5–5.1)
Sodium: 139 mEq/L (ref 136–145)
TOTAL PROTEIN: 6.7 g/dL (ref 6.4–8.3)

## 2016-11-20 MED ORDER — SODIUM CHLORIDE 0.9 % IV SOLN
Freq: Once | INTRAVENOUS | Status: AC
  Administered 2016-11-20: 12:00:00 via INTRAVENOUS

## 2016-11-20 MED ORDER — HEPARIN SOD (PORK) LOCK FLUSH 100 UNIT/ML IV SOLN
500.0000 [IU] | Freq: Once | INTRAVENOUS | Status: AC | PRN
  Administered 2016-11-20: 500 [IU]
  Filled 2016-11-20: qty 5

## 2016-11-20 MED ORDER — SODIUM CHLORIDE 0.9 % IV SOLN
134.5600 mg | Freq: Once | INTRAVENOUS | Status: AC
  Administered 2016-11-20: 130 mg via INTRAVENOUS
  Filled 2016-11-20: qty 13

## 2016-11-20 MED ORDER — SODIUM CHLORIDE 0.9% FLUSH
10.0000 mL | INTRAVENOUS | Status: DC | PRN
  Administered 2016-11-20: 10 mL
  Filled 2016-11-20: qty 10

## 2016-11-20 MED ORDER — FOSAPREPITANT DIMEGLUMINE INJECTION 150 MG
Freq: Once | INTRAVENOUS | Status: AC
  Administered 2016-11-20: 13:00:00 via INTRAVENOUS
  Filled 2016-11-20: qty 5

## 2016-11-20 MED ORDER — SODIUM CHLORIDE 0.9 % IV SOLN
500.0000 mg/m2 | Freq: Once | INTRAVENOUS | Status: AC
  Administered 2016-11-20: 912 mg via INTRAVENOUS
  Filled 2016-11-20: qty 23.99

## 2016-11-20 MED ORDER — CARBOPLATIN CHEMO INTRADERMAL TEST DOSE 100MCG/0.02ML
100.0000 ug | Freq: Once | INTRADERMAL | Status: AC
  Administered 2016-11-20: 100 ug via INTRADERMAL
  Filled 2016-11-20: qty 0.02

## 2016-11-20 MED ORDER — SODIUM CHLORIDE 0.9 % IJ SOLN
10.0000 mL | INTRAMUSCULAR | Status: DC | PRN
  Administered 2016-11-20: 10 mL via INTRAVENOUS
  Filled 2016-11-20: qty 10

## 2016-11-20 MED ORDER — PALONOSETRON HCL INJECTION 0.25 MG/5ML
0.2500 mg | Freq: Once | INTRAVENOUS | Status: AC
  Administered 2016-11-20: 0.25 mg via INTRAVENOUS

## 2016-11-20 MED ORDER — PALONOSETRON HCL INJECTION 0.25 MG/5ML
INTRAVENOUS | Status: AC
  Filled 2016-11-20: qty 5

## 2016-11-20 MED ORDER — PEGFILGRASTIM 6 MG/0.6ML ~~LOC~~ PSKT
6.0000 mg | PREFILLED_SYRINGE | Freq: Once | SUBCUTANEOUS | Status: AC
  Administered 2016-11-20: 6 mg via SUBCUTANEOUS
  Filled 2016-11-20: qty 0.6

## 2016-11-20 NOTE — Patient Instructions (Signed)
[place] Discharge Instructions for Patients Receiving Chemotherapy  Today you received the following chemotherapy agents: Gemzar and Carboplatin   To help prevent nausea and vomiting after your treatment, we encourage you to take your nausea medication as directed.    If you develop nausea and vomiting that is not controlled by your nausea medication, call the clinic.   BELOW ARE SYMPTOMS THAT SHOULD BE REPORTED IMMEDIATELY:  *FEVER GREATER THAN 100.5 F  *CHILLS WITH OR WITHOUT FEVER  NAUSEA AND VOMITING THAT IS NOT CONTROLLED WITH YOUR NAUSEA MEDICATION  *UNUSUAL SHORTNESS OF BREATH  *UNUSUAL BRUISING OR BLEEDING  TENDERNESS IN MOUTH AND THROAT WITH OR WITHOUT PRESENCE OF ULCERS  *URINARY PROBLEMS  *BOWEL PROBLEMS  UNUSUAL RASH Items with * indicate a potential emergency and should be followed up as soon as possible.  Feel free to call the clinic you have any questions or concerns. The clinic phone number is [phone].  Please show the [place] at check-in to the Emergency Department and triage nurse.

## 2016-11-20 NOTE — Telephone Encounter (Signed)
Scheduled appt per 7/6 los - Gave patient AVS and calender per los.  

## 2016-11-21 ENCOUNTER — Encounter: Payer: Self-pay | Admitting: Hematology and Oncology

## 2016-11-21 NOTE — Assessment & Plan Note (Signed)
I spent some time counseling the patient the importance of tobacco cessation. She is currently not interested to quit now.

## 2016-11-21 NOTE — Progress Notes (Signed)
[employer] OFFICE PROGRESS NOTE  Patient Care Team: McDiarmid, Blane Ohara, MD as PCP - General (Family Medicine) Gaye Pollack, MD (Cardiothoracic Surgery) Dickie La, MD (Family Medicine) Heath Lark, MD as Consulting Physician (Hematology and Oncology) Pleasant, Eppie Gibson, RN as [place] [facility] [place], [place], [place] (Optometry) Milus Banister, MD as Consulting Physician (Gastroenterology) Melida Quitter, MD as Consulting Physician (Otolaryngology) Standley Brooking, LCSW as Social Worker  SUMMARY OF ONCOLOGIC HISTORY: Oncology History   Colon cancer   Primary site: Colon and Rectum (Left)   Staging method: AJCC 7th Edition   Clinical: Stage I (T2, N0, M0) signed by Heath Lark, MD on 05/31/2013  2:39 PM   Pathologic: Stage I (T2, N0, cM0) signed by Heath Lark, MD on 05/31/2013  2:39 PM   Summary: Stage I (T2, N0, cM0) Lung cancer, EGFR/ALK negative, recurrence after initial resection to LN and brain   Primary site: Lung (Left)   Staging method: AJCC 7th Edition   Clinical: Stage IV (T1, N2, M1b) signed by Heath Lark, MD on 05/31/2013  2:26 PM   Pathologic: Stage IV (T1, N2, M1b) signed by Heath Lark, MD on 05/31/2013  2:26 PM   Summary: Stage IV (T1, N2, M1b)       Cancer of upper lobe of left lung, Adenocarcinoma   03/01/2007 Procedure    Colonoscopy revealed abnormalities and biopsy show high-grade dysplasia      04/01/2007 Surgery    She underwent sigmoid resection which showed T2 N0 colon cancer, and negative margins and all of 17 lymph nodes were negative      02/29/2008 Procedure    Repeat surveillance colonoscopy was negative.      06/16/2010 Surgery    She underwent left upper lobectomy we show well-differentiated adenocarcinoma of the lung, T1, N0, M0      03/18/2011 Procedure    Repeat colonoscopy show multiple polyps but there were benign      10/19/2011 Procedure    Biopsy of mediastinal lymph node came back positive  for recurrence of lung cancer, EGFR and ALK negative      11/16/2011 - 12/14/2011 Chemotherapy    She received concurrent chemoradiation therapy with weekly carboplatin and Taxol.      11/16/2011 - 12/29/2011 Radiation Therapy    She received radiation therapy with weekly chemotherapy      02/15/2012 Imaging    MR of the brain showed a new intracranial metastases. This was subsequently treated with stereotactic radiosurgery.      03/07/2012 - 04/12/2013 Chemotherapy    She received chemotherapy with maintainence Alimta every 3 weeks. Chemotherapy was discontinued due to profound fatigue      03/02/2013 Procedure    She had therapeutic ultrasound guidance thoracentesis for pleural effusion that came back negative for cancer      05/04/2013 Procedure    She had repeat ultrasound-guided thoracentesis again and cytology was negative      05/29/2013 Imaging    Repeat CT scan of the chest, abdomen and pelvis show no evidence of disease but persistent right-sided pleural effusion      06/02/2013 Surgery    The patient had placement of Pleurx catheter and subsequently underwent pleurodesis.      09/22/2013 Imaging    Repeat CT scan show no evidence of active disease. There are nonspecific lymphadenopathy and she is placed on observation.      03/23/2014 Imaging    Repeat CT scan of the chest,  abdomen and pelvis show recurrence of cancer with widespread bilateral pulmonary metastasis.      05/14/2014 Imaging    Imaging of the chest and brain were repeated due to delay of initiation of chemotherapy. Overall, chest CT scan show stable disease. MRI of the head was negative for recurrence      05/16/2014 - 07/18/2014 Chemotherapy     she completed 4 cycles of combination chemotherapy with carboplatin and Alimta      07/16/2014 Imaging     repeat CT scan of the chest, abdomen and pelvis show regression in the size of lung nodules.      08/29/2014 - 08/14/2015 Chemotherapy    She received  maintenance Alimta      10/16/2014 Imaging     repeat CT scan of the chest, abdomen and pelvis show regression in the size of lung nodules.      01/29/2015 Imaging    Repeat CT scan showed stable disease      02/01/2015 Imaging    MRI brain is negative      04/30/2015 Imaging    CT scan of the chest, abdomen and pelvis showed stable disease      07/25/2015 Imaging    MRI brain showed no evidence of new disease      09/09/2015 Imaging    CT chest showed interval increase in size of large RIGHT upper lobe nodule is most consistent with lung cancer recurrence.      09/18/2015 - 03/26/2016 Chemotherapy    She started on Nivolumab      10/22/2015 Imaging    Screening mammogram showed mild abnormality      10/31/2015 Imaging    Diagnostic mammogram showed mild calcification at the right upper outer breast      11/27/2015 Imaging    Stable post treatment related changes of left lower lobectomy and radiation therapy redemonstrated, as above. No definite findings to suggest local recurrence of disease or metastatic disease on today's examination      01/24/2016 Imaging    MR brain showed continued stable appearance of two small treated brain metastases. No new or progressive metastatic disease to the brain.      04/15/2016 Imaging    Ct scan showed findings highly suspicious for recurrent tumor in the mediastinum surrounding the right mainstem bronchus and in the region of the azygoesophageal recess. Diffuse pulmonary metastatic disease with new and progressive nodules since the prior examination.      04/24/2016 -  Chemotherapy    The patient had chemotherapy with gemzar and Carboplatin      04/30/2016 Imaging    Decreased contrast enhancement of the two treated metastases in the right frontal and left occipital lobes. No new lesions.      06/01/2016 Imaging    CXR showed numerous metastatic nodules have progressed compared to the most recent comparison chest x-ray of 07/15/2015.  Majority of these were noted on the 04/15/2016 chest CT. No segmental infiltrate or pulmonary edema.      06/19/2016 Adverse Reaction    Treatment is placed on hold today due to neutropenia and the patient not feeling well      06/29/2016 Imaging    Interval decrease in size of metastatic pulmonary nodules. 2. Persistent but slightly improved right hilar/mediastinal disease. No progressive findings. 3. No findings for upper abdominal metastatic disease      10/01/2016 Imaging    Mild increase in size of 2.5 cm central right lower lobe pulmonary nodule, consistent with  metastatic disease. Numerous other bilateral sub-cm pulmonary nodules are stable.  Stable mild subcarinal and right cardiophrenic angle lymphadenopathy. Stable emphysema and right lung radiation changes. Stable hepatic steatosis. Aortic and coronary artery atherosclerosis.      11/06/2016 Imaging    MR brain: Stable treated RIGHT insula and LEFT occipital metastases.       Brain metastases treated with radiosurgery   02/26/2012 Initial Diagnosis    Brain metastases treated with radiosurgery       INTERVAL HISTORY: Please see below for problem oriented charting. She returns for further follow-up and chemotherapy She has some planned vacation for class reunion and is looking forward for it She complain of fatigue Denies nausea or vomiting Her leg swelling is about the same She denies recent neurological deficit She continues to have peripheral neuropathy, stable  REVIEW OF SYSTEMS:   Constitutional: Denies fevers, chills or abnormal weight loss Eyes: Denies blurriness of vision Ears, nose, mouth, throat, and face: Denies mucositis or sore throat Respiratory: Denies cough, dyspnea or wheezes Cardiovascular: Denies palpitation, chest discomfort o Gastrointestinal:  Denies nausea, heartburn or change in bowel habits Skin: Denies abnormal skin rashes Lymphatics: Denies new lymphadenopathy or easy  bruising Neurological:Denies numbness, tingling or new weaknesses Behavioral/Psych: Mood is stable, no new changes  All other systems were reviewed with the patient and are negative.  I have reviewed the past medical history, past surgical history, social history and family history with the patient and they are unchanged from previous note.  ALLERGIES:  is allergic to adhesive [tape] and lisinopril.  MEDICATIONS:  Current Outpatient Prescriptions  Medication Sig Dispense Refill  . BYSTOLIC 10 MG tablet Take 10 mg by mouth daily.     . diclofenac sodium (VOLTAREN) 1 % GEL Apply 2 g topically 4 (four) times daily as needed. 100 g 1  . HYDROmorphone (DILAUDID) 8 MG tablet Take 1 tablet (8 mg total) by mouth every 6 (six) hours as needed for severe pain. 60 tablet 0  . ipratropium (ATROVENT HFA) 17 MCG/ACT inhaler Inhale 2 puffs into the lungs every 4 (four) hours as needed for wheezing (Cough). 3 Inhaler 3  . lidocaine-prilocaine (EMLA) cream APPLY TOPICALLY AS NEEDED TO PORTA CATH SITE 1 HOUR PRIOR TO NEEDLE STICK. 30 g 0  . loratadine (CLARITIN) 10 MG tablet Take 10 mg by mouth daily.    . nitroGLYCERIN (NITROSTAT) 0.4 MG SL tablet Place 1 tablet (0.4 mg total) under the tongue every 5 (five) minutes as needed. For chest pain 25 tablet 5  . omeprazole (PRILOSEC) 40 MG capsule Take 40 mg by mouth daily.     . ondansetron (ZOFRAN ODT) 4 MG disintegrating tablet Take 1 tablet (4 mg total) by mouth every 8 (eight) hours as needed for nausea or vomiting. 20 tablet 0  . pravastatin (PRAVACHOL) 40 MG tablet Take 40 mg by mouth daily.     Marland Kitchen umeclidinium-vilanterol (ANORO ELLIPTA) 62.5-25 MCG/INH AEPB Inhale 1 puff into the lungs daily. 3 each 3  . VENTOLIN HFA 108 (90 Base) MCG/ACT inhaler Inhale 2 puffs into the lungs every 4 (four) hours as needed for wheezing or shortness of breath. 3 Inhaler 3   No current facility-administered medications for this visit.    Facility-Administered Medications  Ordered in Other Visits  Medication Dose Route Frequency Provider Last Rate Last Dose  . sodium chloride 0.9 % injection 10 mL  10 mL Intravenous PRN Alvy Bimler, Demarie Hyneman, MD   10 mL at 09/18/15 1345  . sodium chloride  0.9 % injection 10 mL  10 mL Intravenous PRN Alvy Bimler, Annaleia Pence, MD   10 mL at 04/24/16 1136    PHYSICAL EXAMINATION: ECOG PERFORMANCE STATUS: 1 - Symptomatic but completely ambulatory  Vitals:   11/20/16 1048  BP: 138/89  Pulse: 82  Resp: 16  Temp: 97.8 F (36.6 C)   Filed Weights   11/20/16 1048  Weight: 159 lb (72.1 kg)    GENERAL:alert, no distress and comfortable SKIN: skin color, texture, turgor are normal, no rashes or significant lesions EYES: normal, Conjunctiva are pink and non-injected, sclera clear OROPHARYNX:no exudate, no erythema and lips, buccal mucosa, and tongue normal  NECK: supple, thyroid normal size, non-tender, without nodularity LYMPH:  no palpable lymphadenopathy in the cervical, axillary or inguinal LUNGS: clear to auscultation and percussion with normal breathing effort HEART: regular rate & rhythm and no murmurs and no lower extremity edema ABDOMEN:abdomen soft, non-tender and normal bowel sounds Musculoskeletal:no cyanosis of digits and no clubbing  NEURO: alert & oriented x 3 with fluent speech, no focal motor/sensory deficits  LABORATORY DATA:  I have reviewed the data as listed    Component Value Date/Time   NA 139 11/20/2016 0928   K 4.3 11/20/2016 0928   CL 106 11/06/2016 1437   CL 102 10/24/2012 1001   CO2 26 11/20/2016 0928   GLUCOSE 94 11/20/2016 0928   GLUCOSE 111 (H) 10/24/2012 1001   BUN 13.4 11/20/2016 0928   CREATININE 1.0 11/20/2016 0928   CALCIUM 9.1 11/20/2016 0928   PROT 6.7 11/20/2016 0928   ALBUMIN 3.2 (L) 11/20/2016 0928   AST 11 11/20/2016 0928   ALT 7 11/20/2016 0928   ALKPHOS 158 (H) 11/20/2016 0928   BILITOT 0.33 11/20/2016 0928   GFRNONAA 55 (L) 11/06/2016 1437   GFRAA >60 11/06/2016 1437    No results  found for: SPEP, UPEP  Lab Results  Component Value Date   WBC 7.0 11/20/2016   NEUTROABS 5.7 11/20/2016   HGB 9.5 (L) 11/20/2016   HCT 30.3 (L) 11/20/2016   MCV 86.4 11/20/2016   PLT 144 (L) 11/20/2016      Chemistry      Component Value Date/Time   NA 139 11/20/2016 0928   K 4.3 11/20/2016 0928   CL 106 11/06/2016 1437   CL 102 10/24/2012 1001   CO2 26 11/20/2016 0928   BUN 13.4 11/20/2016 0928   CREATININE 1.0 11/20/2016 0928      Component Value Date/Time   CALCIUM 9.1 11/20/2016 0928   ALKPHOS 158 (H) 11/20/2016 0928   AST 11 11/20/2016 0928   ALT 7 11/20/2016 0928   BILITOT 0.33 11/20/2016 0928       RADIOGRAPHIC STUDIES: I have personally reviewed the radiological images as listed and agreed with the findings in the report. Mr Jeri Cos Wo Contrast  Result Date: 11/06/2016 CLINICAL DATA:  Metastatic lung cancer status post [facility] treatment. EXAM: MRI HEAD WITHOUT AND WITH CONTRAST TECHNIQUE: Multiplanar, multiecho pulse sequences of the brain and surrounding structures were obtained without and with intravenous contrast. CONTRAST:  44m MULTIHANCE GADOBENATE DIMEGLUMINE 529 MG/ML IV SOLN COMPARISON:  Multiple priors, most recent 04/30/2016. FINDINGS: Brain: Stable appearance to the previously identified treated metastases, one in the RIGHT insula measuring 2 mm, the other in the LEFT occipital cortex measuring 2 x 3 mm. No new lesions. Mild atrophy with chronic microvascular ischemic change. Vascular: Normal flow voids. Skull and upper cervical spine: Unremarkable. Sinuses/Orbits: No significant or layering fluid.  Negative orbits. Other:  BILATERAL LEFT greater than RIGHT mastoid effusions are stable. No nasopharyngeal process is evident. IMPRESSION: Stable treated RIGHT insula and LEFT occipital metastases. Electronically Signed   By: Staci Righter M.D.   On: 11/06/2016 13:03    ASSESSMENT & PLAN:  Cancer of upper lobe of left lung, Adenocarcinoma I reviewed the CT  imaging with the patient. For now, overall, she has stable disease on chemo I plan to continue treatment without dose adjustment and repeat imaging study again in August  Pancytopenia, acquired [facility]) Pancytopenia is due to chemo I have requested addition of Neulasta in addition to dose reduction She is not symptomatic from anemia or thrombocytopenia.  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 She is prescribed gabapentin. We will monitor carefully Reduce dose chemo as above  TOBACCO ABUSE I spent some time counseling the patient the importance of tobacco cessation. She is currently not interested to quit now.  Brain metastases treated with radiosurgery Her most recent MRI showed no evidence of disease recurrence.   Orders Placed This Encounter  Procedures  . CT CHEST W CONTRAST    Standing Status:   Future    Standing Expiration Date:   01/20/2018    Order Specific Question:   Reason for Exam (SYMPTOM  OR DIAGNOSIS REQUIRED)    Answer:   lung ca, assess response to Rx    Order Specific Question:   Preferred imaging location?    Answer:   [facility]   All questions were answered. The patient knows to call the clinic with any problems, questions or concerns. No barriers to learning was detected. I spent 20 minutes counseling the patient face to face. The total time spent in the appointment was 25 minutes and more than 50% was on counseling and review of test results     Heath Lark, MD 11/21/2016 8:25 AM

## 2016-11-21 NOTE — Assessment & Plan Note (Signed)
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 She is prescribed gabapentin. We will monitor carefully Reduce dose chemo as above

## 2016-11-21 NOTE — Assessment & Plan Note (Signed)
Pancytopenia is due to chemo I have requested addition of Neulasta in addition to dose reduction She is not symptomatic from anemia or thrombocytopenia.

## 2016-11-21 NOTE — Assessment & Plan Note (Signed)
I reviewed the CT imaging with the patient. For now, overall, she has stable disease on chemo I plan to continue treatment without dose adjustment and repeat imaging study again in August

## 2016-11-21 NOTE — Assessment & Plan Note (Signed)
Her most recent MRI showed no evidence of disease recurrence.

## 2016-11-26 ENCOUNTER — Other Ambulatory Visit: Payer: Self-pay | Admitting: *Deleted

## 2016-11-26 NOTE — Telephone Encounter (Signed)
This encounter was created in error - please disregard.

## 2016-11-26 NOTE — Patient Outreach (Signed)
[facility]) Care Management  11/26/2016  Lauren [name] Feb 13, 1951 [id number]   RN Health Coach attempted #1 follow up outreach call to patient.  Patient was unavailable. HIPPA compliance voicemail message left with return callback number.  Plan: RN will call patient again within 14 days.  [place] Care Management [phone]

## 2016-11-27 ENCOUNTER — Other Ambulatory Visit: Payer: Self-pay | Admitting: *Deleted

## 2016-11-27 DIAGNOSIS — J441 Chronic obstructive pulmonary disease with (acute) exacerbation: Secondary | ICD-10-CM

## 2016-11-27 NOTE — Patient Outreach (Signed)
[facility]) Care Management  11/27/2016  Lauren [name] 26-Apr-1951 [id number]  CSW received call back from patient to schedule initial home visit for next Wednesday, July 18th at 1pm. Full assessment & note to follow.    [name], LCSW [facility]  Clinical Social Worker cell #: [phone]

## 2016-11-27 NOTE — Patient Outreach (Signed)
[facility]) Care Management  11/27/2016   Lauren [name] 15-Feb-1951 [id number]   RN Health Coach telephone call to patient.  [place] compliance verified. Per patient she is doing pretty good. Patient is in the green zone. She is still currently smoking. Patient has a cough with productive clear sputum. Patient is using inhalers but stated she forgets to use the anoro daily as prescribed. RN discussed the effects and how she needs to stay on top of her care with receiving the chemo. Patient is able to move about  more and uses the walker instead of cane when she goes out to prevent falling. Per patient she discussed how fatigued she gets when cooking her meals. Per patient she has to go and lay down then come back and eat. Patient is under a lot of stress , but son will be placed next week. Patient states this will help her a lot. Per patient she is getting rid of a lot of things that she wants people to have so that it will be less for her daughter to have to get rid of. Patient is discussing that she knows she will be dying. Patient stated that at this time she does not want hospice to come in. Patient has agreed to follow up outreach calls.        Current Medications:  Current Outpatient Prescriptions  Medication Sig Dispense Refill  . BYSTOLIC 10 MG tablet Take 10 mg by mouth daily.     . diclofenac sodium (VOLTAREN) 1 % GEL Apply 2 g topically 4 (four) times daily as needed. 100 g 1  . HYDROmorphone (DILAUDID) 8 MG tablet Take 1 tablet (8 mg total) by mouth every 6 (six) hours as needed for severe pain. 60 tablet 0  . ipratropium (ATROVENT HFA) 17 MCG/ACT inhaler Inhale 2 puffs into the lungs every 4 (four) hours as needed for wheezing (Cough). 3 Inhaler 3  . lidocaine-prilocaine (EMLA) cream APPLY TOPICALLY AS NEEDED TO PORTA CATH SITE 1 HOUR PRIOR TO NEEDLE STICK. 30 g 0  . loratadine (CLARITIN) 10 MG tablet Take 10 mg by mouth daily.    . nitroGLYCERIN (NITROSTAT) 0.4  MG SL tablet Place 1 tablet (0.4 mg total) under the tongue every 5 (five) minutes as needed. For chest pain 25 tablet 5  . omeprazole (PRILOSEC) 40 MG capsule Take 40 mg by mouth daily.     . ondansetron (ZOFRAN ODT) 4 MG disintegrating tablet Take 1 tablet (4 mg total) by mouth every 8 (eight) hours as needed for nausea or vomiting. 20 tablet 0  . pravastatin (PRAVACHOL) 40 MG tablet Take 40 mg by mouth daily.     Marland Kitchen umeclidinium-vilanterol (ANORO ELLIPTA) 62.5-25 MCG/INH AEPB Inhale 1 puff into the lungs daily. 3 each 3  . VENTOLIN HFA 108 (90 Base) MCG/ACT inhaler Inhale 2 puffs into the lungs every 4 (four) hours as needed for wheezing or shortness of breath. 3 Inhaler 3   No current facility-administered medications for this visit.    Facility-Administered Medications Ordered in Other Visits  Medication Dose Route Frequency Provider Last Rate Last Dose  . sodium chloride 0.9 % injection 10 mL  10 mL Intravenous PRN Alvy Bimler, Ni, MD   10 mL at 09/18/15 1345  . sodium chloride 0.9 % injection 10 mL  10 mL Intravenous PRN Heath Lark, MD   10 mL at 04/24/16 1136    Functional Status:  In your present state of health, do you have any difficulty  performing the following activities: 11/27/2016 09/30/2016  Hearing? [name]  Vision? Y Y  Difficulty concentrating or making decisions? [name]  Walking or climbing stairs? Y Y  Dressing or bathing? N N  Doing errands, shopping? [name]  Preparing Food and eating ? N N  Using the Toilet? N N  In the past six months, have you accidently leaked urine? Y Y  Do you have problems with loss of bowel control? N N  Managing your Medications? N N  Managing your Finances? N N  Housekeeping or managing your Housekeeping? Y Y  Some recent data might be hidden    Fall/Depression Screening: Fall Risk  11/27/2016 11/11/2016 10/15/2016  Falls in the past year? - Yes Yes  Number falls in past yr: 1 1 2  or more  Injury with Fall? No No No  Risk Factor Category  High Fall  Risk - High Fall Risk  Risk for fall due to : History of fall(s);Impaired balance/gait;Impaired mobility History of fall(s) -  Risk for fall due to (comments): - - -  Follow up Falls evaluation completed;Education provided;Falls prevention discussed - Falls prevention discussed   PHQ 2/9 Scores 11/27/2016 11/11/2016 10/15/2016 09/30/2016 08/31/2016 08/13/2016 07/31/2016  PHQ - 2 Score 0 0 0 1 1 0 0  PHQ- 9 Score - - - - - - -  Exception Documentation - - - - - - -   THN CM Care Plan Problem One     Most Recent Value  Care Plan Problem One  knowledge deficit in self management of COPD  Role Documenting the Problem One  [place] for Problem One  Active  THN Long Term Goal   Patient will not have any readmissions for COPD within the next 90 days  THN Long Term Goal Start Date  11/27/16  Interventions for Problem One Long Term Goal  RN reminded patient to keep appointments with PCP and oncology and pulmonary specialist. RN reminded patient to take medications as per order.  THN CM Short Term Goal #1   Patient will report better nutritional intake within the next 30 days  THN CM Short Term Goal #1 Start Date  11/27/16  Interventions for Short Term Goal #1  RN discussed with patient about eating small meals and snacks. RN discussed with patient about drinking more nutritional supplements. RN will referr to Education officer, museum to look at meals on wheels for this patient  [facility] CM Short Term Goal #2   Patient will report meadicaton adherence within the next 30 days  THN CM Short Term Goal #2 Start Date  11/27/16  Interventions for Short Term Goal #2  RN discussed with patient about taking medications as Dr prescribed.  RN discussed the importance of the medications and how they will help. RN will follow up with patient for further discussion     Assessment:  Patient is currently smoking Patient is not remembering to  taking her medications as ordered Patient will continue to  benefit from [place] telephonic outreach for education and support for COPD self management.  Plan:  RN discussed falls prevention RN discussed the medications and importance of taking them Referred to social worker RN discussed the importance of eating healthy RN will follow up within the month of August  Andrzej Scully [place] Management [phone]

## 2016-11-27 NOTE — Patient Outreach (Signed)
[facility]) Care Management  11/27/2016  Lauren [name] 04-16-1951 [id number]   CSW received referral from [place] for assistance with meals. CSW attempted to reach patient to schedule initial home visit, but no answer. CSW left voicemail [phone]). CSW will await call back from patient but will schedule to call again next week.    [name], LCSW [facility]  Clinical Social Worker cell #: [phone]

## 2016-11-30 ENCOUNTER — Ambulatory Visit: Payer: Self-pay | Admitting: *Deleted

## 2016-12-01 ENCOUNTER — Ambulatory Visit (INDEPENDENT_AMBULATORY_CARE_PROVIDER_SITE_OTHER): Payer: Medicare HMO | Admitting: Pulmonary Disease

## 2016-12-02 ENCOUNTER — Encounter: Payer: Self-pay | Admitting: *Deleted

## 2016-12-02 ENCOUNTER — Other Ambulatory Visit: Payer: Self-pay | Admitting: *Deleted

## 2016-12-02 NOTE — Patient Outreach (Signed)
[facility]) Care Management   12/02/2016  Lauren [name] 12/12/1950 [id number]  Lauren [name] is an 66 y.o. female   ROS  Physical Exam  Encounter Medications:   Outpatient Encounter Prescriptions as of 12/02/2016  Medication Sig Note  . BYSTOLIC 10 MG tablet Take 10 mg by mouth daily.  10/16/2016: Taking few times a week  . diclofenac sodium (VOLTAREN) 1 % GEL Apply 2 g topically 4 (four) times daily as needed.   Marland Kitchen HYDROmorphone (DILAUDID) 8 MG tablet Take 1 tablet (8 mg total) by mouth every 6 (six) hours as needed for severe pain. 10/16/2016: Taking a few times a week  . ipratropium (ATROVENT HFA) 17 MCG/ACT inhaler Inhale 2 puffs into the lungs every 4 (four) hours as needed for wheezing (Cough). 10/16/2016: Taking a few times a week  . lidocaine-prilocaine (EMLA) cream APPLY TOPICALLY AS NEEDED TO PORTA CATH SITE 1 HOUR PRIOR TO NEEDLE STICK.   Marland Kitchen loratadine (CLARITIN) 10 MG tablet Take 10 mg by mouth daily.   . nitroGLYCERIN (NITROSTAT) 0.4 MG SL tablet Place 1 tablet (0.4 mg total) under the tongue every 5 (five) minutes as needed. For chest pain 08/29/2016: Has, but has not needed   . omeprazole (PRILOSEC) 40 MG capsule Take 40 mg by mouth daily.  10/16/2016: Taking a few times a week  . ondansetron (ZOFRAN ODT) 4 MG disintegrating tablet Take 1 tablet (4 mg total) by mouth every 8 (eight) hours as needed for nausea or vomiting.   . pravastatin (PRAVACHOL) 40 MG tablet Take 40 mg by mouth daily.  10/16/2016: Taking a few times a week  . umeclidinium-vilanterol (ANORO ELLIPTA) 62.5-25 MCG/INH AEPB Inhale 1 puff into the lungs daily. 10/16/2016: Taking a few times a week  . VENTOLIN HFA 108 (90 Base) MCG/ACT inhaler Inhale 2 puffs into the lungs every 4 (four) hours as needed for wheezing or shortness of breath. 10/16/2016: Taking a few times a week   Facility-Administered Encounter Medications as of 12/02/2016  Medication  . sodium chloride 0.9 % injection 10 mL  .  sodium chloride 0.9 % injection 10 mL    Functional Status:   In your present state of health, do you have any difficulty performing the following activities: 12/02/2016 11/27/2016  Hearing? [name]  Vision? Y Y  Difficulty concentrating or making decisions? [name]  Walking or climbing stairs? Y Y  Dressing or bathing? N N  Doing errands, shopping? Y Y  Conservation officer, nature and eating ? - N  Using the Toilet? - N  In the past six months, have you accidently leaked urine? - Y  Do you have problems with loss of bowel control? - N  Managing your Medications? - N  Managing your Finances? - N  Housekeeping or managing your Housekeeping? - Y  Some recent data might be hidden    Fall/Depression Screening:    Fall Risk  12/02/2016 11/27/2016 11/11/2016  Falls in the past year? Yes - Yes  Number falls in past yr: '1 1 1  ' Injury with Fall? No No No  Risk Factor Category  High Fall Risk High Fall Risk -  Risk for fall due to : History of fall(s);Impaired balance/gait History of fall(s);Impaired balance/gait;Impaired mobility History of fall(s)  Risk for fall due to (comments): - - -  Follow up Falls prevention discussed Falls evaluation completed;Education provided;Falls prevention discussed -   PHQ 2/9 Scores 12/02/2016 11/27/2016 11/11/2016 10/15/2016 09/30/2016 08/31/2016 08/13/2016  PHQ - 2  Score 0 0 0 0 1 1 0  PHQ- 9 Score - - - - - - -  Exception Documentation - - - - - - -    Assessment:    CSW received referral from [place] that patient needs assistance with setting up meals on wheels or nutritional help. Patient is receiving chemotherapy for lung cancer and states that she feels so tired that she is not getting up and cooking a full meal. CSW met with patient at her home today to complete intake assessments and discuss CSW need. Patient reports that she has a rotation of going to [facility] once every 2 weeks and then has a week break. She has been doing this since  2009, and once felt that she did not wish to continue treatments but after speaking with her daughter & sister that she would continue for now. Patient is aware that once she stops treatments, that her life expectancy would be less than 6 months, per Dr. Alvy Bimler. 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   Patient had been to the ED 3 times in the past 6 months for neck pain, COPD exacerbation & weakness following an MRI. CSW called [place] [facility] [place] to get her on a waitlist for [employer] - per Delfino Lovett at [facility] there is a 6 month waitlist and they are no longer accepting applications at this time but will give her a call when they are accepting new applications. CSW will research other agencies for meal assistance & housekeeping to provide to patient.   Plan:   [facility] CM Care Plan Problem One     Most Recent Value  Care Plan Problem One  Assistance with meals and housekeeping  Role Documenting the Problem One  Clinical Social Worker  Care Plan for Problem One  Active  THN CM Short Term Goal #1   Patient will be linked with community resources to assist with meals and housekeeping  THN CM Short Term Goal #1 Start Date  12/02/16  Interventions for Short Term Goal #1  CSW got patient on waitlist for meals on wheels 12/02/16 and will mail resources to patient on agencies that can provide housekeeping.      CSW will mail resources to patient & follow-up in 2 weeks.    [name], LCSW [facility]  Clinical Social Worker cell #: [phone]

## 2016-12-10 ENCOUNTER — Emergency Department (HOSPITAL_COMMUNITY)
Admission: EM | Admit: 2016-12-10 | Discharge: 2016-12-10 | Disposition: A | Discharge status: Home / Self Care | Payer: Medicare HMO | Attending: Emergency Medicine | Admitting: Emergency Medicine

## 2016-12-10 ENCOUNTER — Encounter: Payer: Self-pay | Admitting: Family Medicine

## 2016-12-10 ENCOUNTER — Emergency Department (HOSPITAL_COMMUNITY): Payer: Medicare HMO

## 2016-12-10 ENCOUNTER — Ambulatory Visit (INDEPENDENT_AMBULATORY_CARE_PROVIDER_SITE_OTHER): Payer: Medicare HMO | Admitting: Family Medicine

## 2016-12-10 ENCOUNTER — Encounter (HOSPITAL_COMMUNITY): Payer: Self-pay

## 2016-12-10 VITALS — BP 130/70 | HR 92 | Temp 98.4°F | Ht 64.0 in | Wt 161.0 lb

## 2016-12-10 DIAGNOSIS — I10 Essential (primary) hypertension: Secondary | ICD-10-CM | POA: Insufficient documentation

## 2016-12-10 DIAGNOSIS — Z85038 Personal history of other malignant neoplasm of large intestine: Secondary | ICD-10-CM | POA: Diagnosis not present

## 2016-12-10 DIAGNOSIS — Z79899 Other long term (current) drug therapy: Secondary | ICD-10-CM | POA: Diagnosis not present

## 2016-12-10 DIAGNOSIS — F17210 Nicotine dependence, cigarettes, uncomplicated: Secondary | ICD-10-CM | POA: Diagnosis not present

## 2016-12-10 DIAGNOSIS — M549 Dorsalgia, unspecified: Secondary | ICD-10-CM

## 2016-12-10 DIAGNOSIS — M5441 Lumbago with sciatica, right side: Secondary | ICD-10-CM | POA: Diagnosis not present

## 2016-12-10 DIAGNOSIS — M25551 Pain in right hip: Secondary | ICD-10-CM | POA: Insufficient documentation

## 2016-12-10 DIAGNOSIS — E7800 Pure hypercholesterolemia, unspecified: Secondary | ICD-10-CM | POA: Insufficient documentation

## 2016-12-10 DIAGNOSIS — I2510 Atherosclerotic heart disease of native coronary artery without angina pectoris: Secondary | ICD-10-CM | POA: Diagnosis not present

## 2016-12-10 DIAGNOSIS — J449 Chronic obstructive pulmonary disease, unspecified: Secondary | ICD-10-CM | POA: Diagnosis not present

## 2016-12-10 DIAGNOSIS — E785 Hyperlipidemia, unspecified: Secondary | ICD-10-CM | POA: Insufficient documentation

## 2016-12-10 DIAGNOSIS — M545 Low back pain: Secondary | ICD-10-CM

## 2016-12-10 DIAGNOSIS — M25559 Pain in unspecified hip: Secondary | ICD-10-CM

## 2016-12-10 LAB — URINALYSIS, ROUTINE W REFLEX MICROSCOPIC
Bilirubin Urine: NEGATIVE
Glucose, UA: NEGATIVE mg/dL
Hgb urine dipstick: NEGATIVE
Ketones, ur: NEGATIVE mg/dL
LEUKOCYTES UA: NEGATIVE
Nitrite: NEGATIVE
PH: 5 (ref 5.0–8.0)
Protein, ur: 30 mg/dL — AB
SPECIFIC GRAVITY, URINE: 1.019 (ref 1.005–1.030)

## 2016-12-10 MED ORDER — ONDANSETRON 4 MG PO TBDP
4.0000 mg | ORAL_TABLET | Freq: Once | ORAL | Status: AC
  Administered 2016-12-10: 4 mg via ORAL
  Filled 2016-12-10: qty 1

## 2016-12-10 MED ORDER — ONDANSETRON HCL 4 MG/2ML IJ SOLN: 4.0000 mg | Freq: Once | INTRAMUSCULAR | Status: DC

## 2016-12-10 MED ORDER — HYDROMORPHONE HCL 1 MG/ML IJ SOLN
1.5000 mg | Freq: Once | INTRAMUSCULAR | Status: AC
  Administered 2016-12-10: 1.5 mg via INTRAMUSCULAR

## 2016-12-10 MED ORDER — KETOROLAC TROMETHAMINE 30 MG/ML IJ SOLN
30.0000 mg | Freq: Once | INTRAMUSCULAR | Status: AC
  Administered 2016-12-10: 30 mg via INTRAMUSCULAR

## 2016-12-10 MED ORDER — HYDROMORPHONE HCL 1 MG/ML IJ SOLN: 1.5000 mg | Freq: Once | INTRAMUSCULAR | Status: DC

## 2016-12-10 MED ORDER — LIDOCAINE 5 % EX PTCH
1.0000 | MEDICATED_PATCH | CUTANEOUS | Status: DC
  Administered 2016-12-10: 1 via TRANSDERMAL
  Filled 2016-12-10 (×2): qty 1

## 2016-12-10 MED ORDER — HYDROMORPHONE HCL 1 MG/ML IJ SOLN
1.5000 mg | Freq: Once | INTRAMUSCULAR | Status: DC
  Filled 2016-12-10: qty 2

## 2016-12-10 NOTE — Progress Notes (Signed)
   Subjective:    Patient ID: Lauren [name], female    DOB: Jul 13, 1950, 66 y.o.   MRN: [id number] Lauren [name] is alone Sources of clinical information for visit is/are patient and past medical records. Nursing assessment for this office visit was reviewed with the patient for accuracy and revision.   HPI  BACK PAIN  Onset: Afternoon of 12/07/16 Location: midline to right-side lower back  Quality: burn, deep hard ache, episodes of sharp pain with movement of being bumped  Course: progressively worsening over last three days Worse with: Movement  Better with: only slight improved with Dilaudid 8 mg oral every 6 hr  Radiation: no Trauma: no Best sitting/standing/leaning forward: no change  Red Flags Fecal/urinary incontinence: no  Numbness/Weakness: yes, chronic on left side  Fever/chills/sweats: no  Night pain: yes  Unexplained weight loss: no  Relief with bedrest: No h/o cancer/immunosuppression: yes, Stage IV Nonsmall cell Lung Cancer, 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 (+) Portacath IV drug use: no  PMH of osteoporosis or chronic steroid use: Several courses of Prednisone for radiation pneumonitis and COPD exacerbations Fucntion: Acute change in ability to toilet self secondary to pain SH: Patient lives alone.   (+) smoking. Transportation is by [facility].  Pt took [facility] to office visit today.      Review of Systems No joint swelling No nausea/vomiting.  Eating well and drinking fluids OK.     Objective:   Physical Exam VS reviewed GEN: Alert, distressed appearing from pain, able to slowly ambulate with Rollator from parking lot to exam room COR: RRR, No M/G/R, No JVD, Normal PMI size and location LUNGS: BCTA, No Acc mm use, speaking in full sentences Back: tender to palpation lumbar midline and right paraspinal mm.   Neuro: Oriented to person, place, and time; Sit to stand unassisted.  Ambulation unassisted except from Rollator Gait: Slow Rollator  assisted ambulation, nonatalgic.  No significant path deviation, No Step through,  Psych: Tearful affect, normal thought/speech/language    Assessment & Plan:  1. Acute Low Back Pain without radiculopathy - At-risk for serious lumbar spine process given Stage IV Non-small cell Lung Cancer, ongoing Chemotherapy, history of corticosteroid therapy, pain awakening patient from sleep.  - Pain unresponsive to repeated home doses of Hydromorphone 8 mg PO.  - No evidence of spinal cord compromise  - Pt has next round of Chemotherapy tomorrow  - Pt lives alone. Acute decline in ability to go to commode secondary to pain.   After discussing management options, it was decided to admit patient to an Observation bed on the [facility] Teaching Service with goal of pain control, rapid diagnostic imaging and provide assistance with acutely impaired self-care ability.   Unfortunately, there were no OBS beds available, and none in next few hours.   Lauren [name] agreed to go to [facility] ED for evaluation, imaging and provision of adequate acute analgesia treatment.  FMTS informed of possible admission from ED today. Lauren [name] was given Toradol 30 mg IM in the office at about Noon prior to transport by [facility] staff to ED.  Lauren [name] may not be able to attend her chemotherapy session tomorrow.

## 2016-12-10 NOTE — ED Notes (Signed)
Patch requested from pharmacy

## 2016-12-10 NOTE — ED Triage Notes (Signed)
Per Pt, Pt is coming from PCP with complaints of right lower back pain that started four days ago. Pt reports taking prescribed pain medication with no relief. 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 Denies urinary symptoms. Hx of Lung Cancer

## 2016-12-10 NOTE — ED Notes (Signed)
Pt stable, ambulatory, states understanding of discharge instructions 

## 2016-12-10 NOTE — ED Notes (Signed)
Requested patch from pharmacy again

## 2016-12-10 NOTE — ED Notes (Signed)
Patient transported to X-ray 

## 2016-12-10 NOTE — ED Provider Notes (Signed)
[facility] DEPT Provider Note   CSN: [id number] Arrival date & time: 12/10/16  1224     History   Chief Complaint Chief Complaint  Patient presents with  . Back Pain    HPI Lauren [name] is a 66 y.o. female with history of stage IV lung cancer coming in today with right lower back pain. Patient states she rolled over in bed a few days ago and all of a sudden felt pain in her right lower back/right hip. Since then she has had worsening of her pain despite taking her home pain regimen including oral Dilaudid and tramadol. She reports no urinary or bowel incontinence. No groin paresthesias. Denies any trauma or any other inciting events besides rolling over in bed. Initially seen by family medicine today who sent her here for further evaluation. No chest pain, abdominal pain, nausea, vomiting.  The history is provided by the patient.  Back Pain   Pertinent negatives include no chest pain, no fever, no numbness, no abdominal pain and no dysuria.    Past Medical History:  Diagnosis Date  . Abnormality of gait 09/10/2015  . Acute on chronic respiratory failure with hypoxemia ([place]) 07/11/2015  . Anemia due to chemotherapy 08/29/2014  . 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 Angina pectoris ([place]) 03/05/2016  . Arthritis   . ASCUS with positive high risk HPV 04/21/2012   colpo clinically normal 04/2012.  Given her other significant co morbidities (recurrent lung cancer, brain mets and colon cancer requiring chronic chemo) I don't think I would be very aggressive in pursuing any more paps. If she felt necessary, could do a repeat in one year, but the likelihood is that this is the least of her problems and I doubt she will benefit from repeated paps and colpos and I sincerely doubt anything we find would change the course of her life. We discussed. She has already decided to consider stopping chemo in May because after 5 years she is quite tired of it. I would always be willing to do a  pap, but my recommendation is to stop.   . Back pain   . Benign paroxysmal positional vertigo 08/16/2015  . Bilateral hearing loss, Mild 02/27/2016   03/23/16 Hearing loss Evaluation by Unice Bailey, MD, [facility], Dx with mild bilateral hearing loss except normal hearing in right ear of 500 Hz and 1000 Hz.  Dr Redmond Baseman recommends annual hearing evaluations.   . Bilateral leg edema 10/17/2014  . Bilateral pleural effusion 08/09/2013  . Blepharitis of both eyes 11/09/2014  . Blurred vision, bilateral 06/06/2014  . Brain metastases ([place]) 02/15/12  . Cerebral aneurysm   . CEREBRAL ANEURYSM 12/29/2006   Qualifier: Diagnosis of  By: Girard Cooter MD, MAKEECHA    . Chronic dermatitis of hands 02/27/2016  . Chronic folliculitis    of groin  . Colon cancer ([place]) 11/08  . COPD (chronic obstructive pulmonary disease) ([place])   . 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 Coronary artery disease   . Coronary atherosclerosis 09/09/2007   Qualifier: Diagnosis of  By: Martinique, Bonnie    . Depression   . Depressive disorder   . DEPRESSIVE DISORDER, MAJOR, RCR, MILD 12/29/2006   Qualifier: Diagnosis of  By: Girard Cooter MD, MAKEECHA    . Dyspnea 07/15/2015  . Encounter for antineoplastic chemotherapy 09/11/2015  . Encounter for therapeutic drug monitoring 09/11/2015  . Essential hypertension 12/29/2006   Echocardiogram (07/26/13): Left ventricle: The cavity size was normal. Wall  thickness was increased in a pattern of mild LVH. Systolic function was normal. The estimated ejection fraction was in the range of 55% to 60%. Wall motion was normal; there were no regional wall motion abnormalities. Normal mitral valve.    . Falls frequently 08/02/2015  . Family history of Huntington's disease   . Family history of trichomonal vaginitis 05/2005  . Fatigue 02/06/2013  . 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 Fibrocystic breast changes   . GERD 12/29/2006   Qualifier: Diagnosis of  By: Girard Cooter MD, MAKEECHA     . GERD (gastroesophageal reflux disease)   . History of colon cancer 05/31/2013  . Hypercholesterolemia   . HYPERCHOLESTEROLEMIA 12/29/2006   Qualifier: Diagnosis of  By: Girard Cooter MD, MAKEECHA    . Hyperlipidemia   . Hypertension   . Hypokalemia 08/09/2013  . Insomnia 10/05/2011  . Left-sided low back pain with left-sided sciatica 07/18/2014  . Low back pain with sciatica 06/22/2014  . Lung cancer ([place]) 06/16/10   PET scan 04/28/2010; primary: increase in size 02/2010 / Well Differentiated Adenocarcinoma of the lung   . Lung nodule    FNA ordered for 04/02/10 by HA>pos Ca  . LVH (left ventricular hypertrophy) 12/20/2014   Echocardiogram (07/26/13): Left ventricle: Wall thickness was increased in a pattern of mild LVH. The cavity size was normal. Systolic function was normal. The estimated ejection fraction was in the range of 55% to 60%. Wall motion was normal; there were no regional wall motion abnormalities. Normal mitral valve.   . On antineoplastic chemotherapy started 02/2012   [place]  . Other fatigue 11/28/2014  . Pleural effusion 05/03/2013  . 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 Postmenopausal   . POSTMENOPAUSAL SYNDROME 01/31/2009   Qualifier: Diagnosis of  By: Carlena Sax  MD, [place]    . Postural dizziness 06/19/2015  . Protein-calorie malnutrition, moderate ([place]) 09/11/2015  . S/P radiation therapy 03/01/12   SRS: 1 fraction / 20 Gray each to the Left Occipital Region and to the Right Insular Metastases  . Sensorineural hearing loss (SNHL) of both ears 04/23/2016   Dr Keturah Barre. Redmond Baseman (ENT) dx significant bilateral hearing loss and recommended biaural hearing aids.   . Shortness of breath   . Skin rash 10/03/2015  . Status post chemotherapy comp. 12/29/11   Carboplatin/Taxol  . Status post radiation therapy 11/16/11 - 12/29/11   Right Lung and Mediastinum: 60 Gy  . Status post stereotactic radiosurgery 01/2012   for Brain Metastases  . Tobacco dependence   .  Vitamin D deficiency 08/30/2015  . Weight loss 10/22/2011    Patient Active Problem List   Diagnosis Date Noted  . Leukocytosis 08/21/2016  . Stress response 08/13/2016  . Cancer associated pain 05/22/2016  . Pancytopenia, acquired ([place]) 05/01/2016  . Chronic kidney disease, stage III (moderate) 05/01/2016  . Sensorineural hearing loss (SNHL) of both ears 04/23/2016  . Osteoarthritis of right knee 04/23/2016  . Balance problem 04/23/2016  . Impingement syndrome of left shoulder 04/23/2016  . Goals of care, counseling/discussion 04/16/2016  . Chronic dermatitis of hands 02/27/2016  . Abnormal mammogram of right breast 11/01/2015  . Encounter for chemotherapy management 09/11/2015  . Encounter for therapeutic drug monitoring 09/11/2015  . Encounter for antineoplastic chemotherapy 09/11/2015  . Vitamin D deficiency 08/30/2015  . Mild cognitive impairment 08/16/2015  . Falls frequently 08/02/2015  . COPD mixed type ([place]) 07/15/2015  . Protein calorie malnutrition ([place]) 03/14/2015  . LVH (left ventricular hypertrophy)  12/20/2014  . Anemia due to chemotherapy 08/29/2014  . Neuropathy due to chemotherapeutic drug ([place]) 07/19/2014  . History of colon cancer 05/31/2013  . Brain metastases treated with radiosurgery 02/26/2012  . Cancer of upper lobe of left lung, Adenocarcinoma   . TOBACCO ABUSE 10/04/2007  . Coronary atherosclerosis 09/09/2007  . HYPERCHOLESTEROLEMIA 12/29/2006  . DEPRESSIVE DISORDER, MAJOR, RCR, MILD 12/29/2006  . Essential hypertension 12/29/2006  . CEREBRAL ANEURYSM 12/29/2006  . GERD 12/29/2006    Past Surgical History:  Procedure Laterality Date  . BACK SURGERY     Dr Luiz Ochoa  . BREAST SURGERY     Bil lumpectomy  . cardiac cath x3    . CHEST TUBE INSERTION Right 06/12/2013   Procedure: INSERTION PLEURAL DRAINAGE CATHETER;  Surgeon: Gaye Pollack, MD;  Location: [facility];  Service: Thoracic;  Laterality: Right;  . COLECTOMY  03/22/07   Stage 1 pT2 N0, M0  Adenocarcinoma of the sigmoid  colon  . HERNIA REPAIR    . LUNG LOBECTOMY  06/16/10   Left Upper Lobectomy  . MEDIASTINOSCOPY  10/19/2011   Procedure: MEDIASTINOSCOPY;  Surgeon: Gaye Pollack, MD;  Location: [facility];  Service: Thoracic;  Laterality: N/A;  . TALC PLEURODESIS Right 06/12/2013   Procedure: Pietro Cassis;  Surgeon: Gaye Pollack, MD;  Location: [facility];  Service: Thoracic;  Laterality: Right;  . TUBAL LIGATION    . TUNNELED VENOUS CATHETER PLACEMENT     Port-a-Cath    OB History    No data available       Home Medications    Prior to Admission medications   Medication Sig Start Date End Date Taking? Authorizing Provider  BYSTOLIC 10 MG tablet Take 10 mg by mouth daily.  06/16/16  Yes [provider]  diclofenac sodium (VOLTAREN) 1 % GEL Apply 2 g topically 4 (four) times daily as needed. Patient taking differently: Apply 2 g topically 4 (four) times daily as needed (pain).  10/06/16  Yes Gorsuch, Ni, MD  HYDROmorphone (DILAUDID) 8 MG tablet Take 1 tablet (8 mg total) by mouth every 6 (six) hours as needed for severe pain. 08/21/16  Yes Gorsuch, Ni, MD  ipratropium (ATROVENT HFA) 17 MCG/ACT inhaler Inhale 2 puffs into the lungs every 4 (four) hours as needed for wheezing (Cough). 09/09/16  Yes Noralee Space, MD  lidocaine-prilocaine (EMLA) cream APPLY TOPICALLY AS NEEDED TO PORTA CATH SITE 1 HOUR PRIOR TO NEEDLE STICK. 10/06/16  Yes Gorsuch, Ni, MD  loratadine (CLARITIN) 10 MG tablet Take 10 mg by mouth daily.   Yes [provider]  nitroGLYCERIN (NITROSTAT) 0.4 MG SL tablet Place 1 tablet (0.4 mg total) under the tongue every 5 (five) minutes as needed. For chest pain 12/20/14  Yes McDiarmid, Blane Ohara, MD  omeprazole (PRILOSEC) 40 MG capsule Take 40 mg by mouth daily.  06/16/16  Yes [provider]  pravastatin (PRAVACHOL) 40 MG tablet Take 40 mg by mouth daily.  06/16/16  Yes [provider]  umeclidinium-vilanterol (ANORO ELLIPTA) 62.5-25 MCG/INH  AEPB Inhale 1 puff into the lungs daily. 09/09/16  Yes Noralee Space, MD  VENTOLIN HFA 108 [phone] Base) MCG/ACT inhaler Inhale 2 puffs into the lungs every 4 (four) hours as needed for wheezing or shortness of breath. 09/09/16  Yes Noralee Space, MD  ondansetron (ZOFRAN ODT) 4 MG disintegrating tablet Take 1 tablet (4 mg total) by mouth every 8 (eight) hours as needed for nausea or vomiting. Patient not taking: Reported on  12/10/2016 04/23/16   McDiarmid, Blane Ohara, MD    Family History Family History  Problem Relation Age of Onset  . Stomach cancer Maternal Aunt   . Osteoarthritis Father   . Gout Father   . Hypertension Father   . Heart disease Mother        pericarditis;   . Breast cancer Cousin   . Cancer Sister        Lymphatic  . Huntington's disease Son   . Huntington's disease Son   . Anesthesia problems Neg Hx     Social History Social History  Substance Use Topics  . Smoking status: Current Every Day Smoker    Packs/day: 0.75    Types: Cigarettes    Start date: 05/18/1965  . Smokeless tobacco: Never Used  . Alcohol use No     Allergies   Adhesive [tape] and Lisinopril   Review of Systems Review of Systems  Constitutional: Negative for chills and fever.  HENT: Negative for ear pain and sore throat.   Eyes: Negative for pain and visual disturbance.  Respiratory: Negative for cough and shortness of breath.   Cardiovascular: Negative for chest pain and palpitations.  Gastrointestinal: Negative for abdominal pain and vomiting.  Genitourinary: Negative for dysuria and hematuria.  Musculoskeletal: Positive for back pain. Negative for arthralgias.       Right hip pain  Skin: Negative for color change and rash.  Allergic/Immunologic: Positive for immunocompromised state.  Neurological: Negative for seizures, syncope, light-headedness and numbness.  Psychiatric/Behavioral: Negative for agitation and behavioral problems.  All other systems reviewed and are  negative.    Physical Exam Updated Vital Signs BP 130/64   Pulse 75   Temp 98.6 F (37 C) (Oral)   Resp 15   Ht 5\' 4"  (1.626 m)   Wt 72.6 kg (160 lb)   SpO2 96%   BMI 27.46 kg/m   Physical Exam  Constitutional: She is oriented to person, place, and time. She appears well-developed and well-nourished. No distress.  HENT:  Head: Normocephalic and atraumatic.  Eyes: Conjunctivae are normal.  Neck: Normal range of motion. Neck supple. No tracheal deviation present.  Cardiovascular: Normal rate and regular rhythm.   No murmur heard. Pulmonary/Chest: Effort normal and breath sounds normal. No respiratory distress.  Abdominal: Soft. She exhibits no distension. There is no tenderness. There is no rebound and no guarding.  Musculoskeletal: She exhibits no edema or deformity.  Tender to palpation of right hip. No midline spinal tenderness.  Neurological: She is alert and oriented to person, place, and time. No cranial nerve deficit or sensory deficit.  Skin: Skin is warm and dry.  Psychiatric: She has a normal mood and affect.  Nursing note and vitals reviewed.    ED Treatments / Results  Labs (all labs ordered are listed, but only abnormal results are displayed) Labs Reviewed  URINALYSIS, ROUTINE W REFLEX MICROSCOPIC - Abnormal; Notable for the following:       Result Value   Color, Urine AMBER (*)    APPearance HAZY (*)    Protein, ur 30 (*)    Bacteria, UA RARE (*)    Squamous Epithelial / LPF 6-30 (*)    All other components within normal limits    EKG  EKG Interpretation None       Radiology Dg Lumbar Spine 2-3 Views  Result Date: 12/10/2016 CLINICAL DATA:  Recent fall with low back pain, initial encounter EXAM: LUMBAR SPINE - 2-3 VIEW COMPARISON:  11/26/2015 FINDINGS:  Five lumbar-type vertebral bodies are noted. Vertebral body height is well maintained. Disc space narrowing is noted at L5-S1 stable from the prior exam. No compression deformities are seen. No  acute bony abnormality is noted. Aortic calcifications are seen. IMPRESSION: Mild degenerative change without acute abnormality. Electronically Signed   By: Inez Catalina M.D.   On: 12/10/2016 17:58   Dg Hip Unilat With Pelvis 2-3 Views Right  Result Date: 12/10/2016 CLINICAL DATA:  Right-sided hip pain, no known injury, initial encounter, history of lung carcinoma EXAM: DG HIP (WITH OR WITHOUT PELVIS) 3V RIGHT COMPARISON:  None. FINDINGS: Mild degenerative changes of the hip joint are noted. No acute fracture or dislocation is seen. Pelvic ring is intact. No soft tissue abnormality is seen. IMPRESSION: No acute abnormality noted. Electronically Signed   By: Inez Catalina M.D.   On: 12/10/2016 17:56    Procedures Procedures (including critical care time)  Medications Ordered in ED Medications  HYDROmorphone (DILAUDID) injection 1.5 mg (1.5 mg Intramuscular Given 12/10/16 1711)  ondansetron (ZOFRAN-ODT) disintegrating tablet 4 mg (4 mg Oral Given 12/10/16 1843)     Initial Impression / Assessment and Plan / ED Course  I have reviewed the triage vital signs and the nursing notes.  Pertinent labs & imaging results that were available during my care of the patient were reviewed by me and considered in my medical decision making (see chart for details).     Patient presenting with a few days of worsening right lower back, right hip pain. First noticed she rolled over in bed. No midline spinal tenderness, no groin paresthesias or bowel/bladder incontinence. Doubt cauda equina, spinal cord compression, any other acute emergent process at this time. On my exam her tenderness is along her right hip. She does have risk factors for bony metastases considering her stage IV lung cancer as well as pathologic fractures with taking prednisone and currently being on chemotherapy. Given 1.5 mg Dilaudid as well as Lidoderm patch for the hip, as it's possible this is musculoskeletal strain from rolling over in bed.    Upon recheck 30 minutes later, patient reports her right hip pain has improved with patch as well as IV Dilaudid. She still had no midline spinal tenderness. X-ray imaging of the lumbar spine as well as hip negative for acute fractures or other abnormalities. I do believe she is safe for discharge at this time. Instructed to follow-up with her PCP in the next few days for recheck as well as attending her normally scheduled chemotherapy appointments. She and her caregiver voiced understanding and agreement and were comfortable with outpatient management.  Patient was seen with my attending, Dr. Rex Kras, who voiced agreement and oversaw the evaluation and treatment of this patient.   Dragon Field seismologist was used in the creation of this note. If there are any errors or inconsistencies needing clarification, please contact me directly.   Final Clinical Impressions(s) / ED Diagnoses   Final diagnoses:  Hip pain  Acute right-sided low back pain, with sciatica presence unspecified    New Prescriptions Discharge Medication List as of 12/10/2016  7:29 PM       Valda Lamb, MD 12/11/16 [place], Wenda Overland, MD 12/15/16 [phone]

## 2016-12-11 ENCOUNTER — Ambulatory Visit: Payer: Medicare HMO

## 2016-12-11 ENCOUNTER — Ambulatory Visit (HOSPITAL_BASED_OUTPATIENT_CLINIC_OR_DEPARTMENT_OTHER): Payer: Medicare HMO

## 2016-12-11 ENCOUNTER — Other Ambulatory Visit (HOSPITAL_BASED_OUTPATIENT_CLINIC_OR_DEPARTMENT_OTHER): Payer: Medicare HMO

## 2016-12-11 VITALS — BP 158/80 | HR 77 | Temp 97.8°F | Resp 18

## 2016-12-11 DIAGNOSIS — C7931 Secondary malignant neoplasm of brain: Secondary | ICD-10-CM

## 2016-12-11 DIAGNOSIS — C7949 Secondary malignant neoplasm of other parts of nervous system: Secondary | ICD-10-CM

## 2016-12-11 DIAGNOSIS — C3412 Malignant neoplasm of upper lobe, left bronchus or lung: Secondary | ICD-10-CM | POA: Diagnosis not present

## 2016-12-11 DIAGNOSIS — Z5111 Encounter for antineoplastic chemotherapy: Secondary | ICD-10-CM

## 2016-12-11 LAB — COMPREHENSIVE METABOLIC PANEL
ALBUMIN: 3 g/dL — AB (ref 3.5–5.0)
AST: 11 U/L (ref 5–34)
Alkaline Phosphatase: 138 U/L (ref 40–150)
Anion Gap: 8 mEq/L (ref 3–11)
BUN: 16.4 mg/dL (ref 7.0–26.0)
CALCIUM: 9.1 mg/dL (ref 8.4–10.4)
CHLORIDE: 108 meq/L (ref 98–109)
CO2: 25 mEq/L (ref 22–29)
CREATININE: 1 mg/dL (ref 0.6–1.1)
EGFR: 66 mL/min/{1.73_m2} — ABNORMAL LOW (ref >90)
GLUCOSE: 84 mg/dL (ref 70–140)
POTASSIUM: 3.9 meq/L (ref 3.5–5.1)
SODIUM: 141 meq/L (ref 136–145)
TOTAL PROTEIN: 6.7 g/dL (ref 6.4–8.3)
Total Bilirubin: 0.49 mg/dL (ref 0.20–1.20)

## 2016-12-11 LAB — CBC WITH DIFFERENTIAL/PLATELET
BASO%: 1 % (ref 0.0–2.0)
Basophils Absolute: 0.1 10*3/uL (ref 0.0–0.1)
EOS%: 0.7 % (ref 0.0–7.0)
Eosinophils Absolute: 0.1 10*3/uL (ref 0.0–0.5)
HCT: 31.6 % — ABNORMAL LOW (ref 34.8–46.6)
HEMOGLOBIN: 9.9 g/dL — AB (ref 11.6–15.9)
LYMPH%: 10.4 % — AB (ref 14.0–49.7)
MCH: 26.8 pg (ref 25.1–34.0)
MCHC: 31.4 g/dL — ABNORMAL LOW (ref 31.5–36.0)
MCV: 85.5 fL (ref 79.5–101.0)
MONO#: 0.9 10*3/uL (ref 0.1–0.9)
MONO%: 12.1 % (ref 0.0–14.0)
NEUT%: 75.8 % (ref 38.4–76.8)
NEUTROS ABS: 5.8 10*3/uL (ref 1.5–6.5)
PLATELETS: 251 10*3/uL (ref 145–400)
RBC: 3.7 10*6/uL (ref 3.70–5.45)
RDW: 23.3 % — AB (ref 11.2–14.5)
WBC: 7.7 10*3/uL (ref 3.9–10.3)
lymph#: 0.8 10*3/uL — ABNORMAL LOW (ref 0.9–3.3)

## 2016-12-11 MED ORDER — PALONOSETRON HCL INJECTION 0.25 MG/5ML
INTRAVENOUS | Status: AC
  Filled 2016-12-11: qty 5

## 2016-12-11 MED ORDER — CARBOPLATIN CHEMO INTRADERMAL TEST DOSE 100MCG/0.02ML
100.0000 ug | Freq: Once | INTRADERMAL | Status: AC
  Administered 2016-12-11: 100 ug via INTRADERMAL
  Filled 2016-12-11: qty 0.01

## 2016-12-11 MED ORDER — SODIUM CHLORIDE 0.9 % IV SOLN
Freq: Once | INTRAVENOUS | Status: AC
  Administered 2016-12-11: 14:00:00 via INTRAVENOUS
  Filled 2016-12-11: qty 5

## 2016-12-11 MED ORDER — SODIUM CHLORIDE 0.9 % IV SOLN
Freq: Once | INTRAVENOUS | Status: AC
  Administered 2016-12-11: 12:00:00 via INTRAVENOUS

## 2016-12-11 MED ORDER — SODIUM CHLORIDE 0.9% FLUSH
10.0000 mL | INTRAVENOUS | Status: DC | PRN
  Administered 2016-12-11: 10 mL
  Filled 2016-12-11: qty 10

## 2016-12-11 MED ORDER — HEPARIN SOD (PORK) LOCK FLUSH 100 UNIT/ML IV SOLN
500.0000 [IU] | Freq: Once | INTRAVENOUS | Status: AC | PRN
  Administered 2016-12-11: 500 [IU]
  Filled 2016-12-11: qty 5

## 2016-12-11 MED ORDER — CARBOPLATIN CHEMO INJECTION 450 MG/45ML
134.5600 mg | Freq: Once | INTRAVENOUS | Status: AC
  Administered 2016-12-11: 130 mg via INTRAVENOUS
  Filled 2016-12-11: qty 13

## 2016-12-11 MED ORDER — GEMCITABINE HCL CHEMO INJECTION 1 GM/26.3ML
500.0000 mg/m2 | Freq: Once | INTRAVENOUS | Status: AC
  Administered 2016-12-11: 912 mg via INTRAVENOUS
  Filled 2016-12-11: qty 23.99

## 2016-12-11 MED ORDER — PALONOSETRON HCL INJECTION 0.25 MG/5ML
0.2500 mg | Freq: Once | INTRAVENOUS | Status: AC
  Administered 2016-12-11: 0.25 mg via INTRAVENOUS

## 2016-12-11 NOTE — Patient Instructions (Signed)
[facility] Discharge Instructions for Patients Receiving Chemotherapy  Today you received the following chemotherapy agents: Gemzar and Carboplatin   To help prevent nausea and vomiting after your treatment, we encourage you to take your nausea medication as directed.    If you develop nausea and vomiting that is not controlled by your nausea medication, call the clinic.   BELOW ARE SYMPTOMS THAT SHOULD BE REPORTED IMMEDIATELY:  *FEVER GREATER THAN 100.5 F  *CHILLS WITH OR WITHOUT FEVER  NAUSEA AND VOMITING THAT IS NOT CONTROLLED WITH YOUR NAUSEA MEDICATION  *UNUSUAL SHORTNESS OF BREATH  *UNUSUAL BRUISING OR BLEEDING  TENDERNESS IN MOUTH AND THROAT WITH OR WITHOUT PRESENCE OF ULCERS  *URINARY PROBLEMS  *BOWEL PROBLEMS  UNUSUAL RASH Items with * indicate a potential emergency and should be followed up as soon as possible.  Feel free to call the clinic you have any questions or concerns. The clinic phone number is [phone].  Please show the [facility] at check-in to the Emergency Department and triage nurse.

## 2016-12-16 ENCOUNTER — Other Ambulatory Visit: Payer: Self-pay | Admitting: *Deleted

## 2016-12-16 NOTE — Patient Outreach (Signed)
[facility]) Care Management  12/16/2016  Lauren [name] Feb 14, 1951 [id number]   CSW called & spoke with patient to follow-up from initial home visit. CSW provided patient with contact information to [place] with [facility] which offers free/discounted cleaning services to patient's undergoing cancer treatment. Patient to call [facility] Guiles to make arrangements. CSW also informed patient that she is now on the list for Meals on Wheels once they are accepting new patients, they will give her a call. CSW will check back in 2 weeks to ensure that contact has been made with [facility].    [name], LCSW [facility]  Clinical Social Worker cell #: [phone]

## 2016-12-17 ENCOUNTER — Ambulatory Visit: Payer: Medicare HMO | Admitting: Pulmonary Disease

## 2016-12-18 ENCOUNTER — Telehealth: Payer: Self-pay

## 2016-12-18 ENCOUNTER — Ambulatory Visit (HOSPITAL_BASED_OUTPATIENT_CLINIC_OR_DEPARTMENT_OTHER): Payer: Medicare HMO

## 2016-12-18 ENCOUNTER — Ambulatory Visit: Payer: Medicare HMO

## 2016-12-18 VITALS — BP 131/81 | HR 93 | Temp 98.0°F | Resp 18

## 2016-12-18 DIAGNOSIS — C7931 Secondary malignant neoplasm of brain: Secondary | ICD-10-CM

## 2016-12-18 DIAGNOSIS — C3412 Malignant neoplasm of upper lobe, left bronchus or lung: Secondary | ICD-10-CM | POA: Diagnosis not present

## 2016-12-18 DIAGNOSIS — Z5189 Encounter for other specified aftercare: Secondary | ICD-10-CM

## 2016-12-18 DIAGNOSIS — Z5111 Encounter for antineoplastic chemotherapy: Secondary | ICD-10-CM

## 2016-12-18 DIAGNOSIS — C7949 Secondary malignant neoplasm of other parts of nervous system: Secondary | ICD-10-CM

## 2016-12-18 LAB — CBC WITH DIFFERENTIAL/PLATELET
BASO%: 1.4 % (ref 0.0–2.0)
Basophils Absolute: 0 10*3/uL (ref 0.0–0.1)
EOS%: 0.6 % (ref 0.0–7.0)
Eosinophils Absolute: 0 10*3/uL (ref 0.0–0.5)
HCT: 31.5 % — ABNORMAL LOW (ref 34.8–46.6)
HGB: 9.8 g/dL — ABNORMAL LOW (ref 11.6–15.9)
LYMPH#: 0.6 10*3/uL — AB (ref 0.9–3.3)
LYMPH%: 23.3 % (ref 14.0–49.7)
MCH: 26.3 pg (ref 25.1–34.0)
MCHC: 31.2 g/dL — AB (ref 31.5–36.0)
MCV: 84.4 fL (ref 79.5–101.0)
MONO#: 0.5 10*3/uL (ref 0.1–0.9)
MONO%: 19.2 % — ABNORMAL HIGH (ref 0.0–14.0)
NEUT#: 1.4 10*3/uL — ABNORMAL LOW (ref 1.5–6.5)
NEUT%: 55.5 % (ref 38.4–76.8)
Platelets: 156 10*3/uL (ref 145–400)
RBC: 3.73 10*6/uL (ref 3.70–5.45)
RDW: 23.7 % — ABNORMAL HIGH (ref 11.2–14.5)
WBC: 2.5 10*3/uL — ABNORMAL LOW (ref 3.9–10.3)

## 2016-12-18 LAB — COMPREHENSIVE METABOLIC PANEL
AST: 11 U/L (ref 5–34)
Albumin: 3.1 g/dL — ABNORMAL LOW (ref 3.5–5.0)
Alkaline Phosphatase: 125 U/L (ref 40–150)
Anion Gap: 8 mEq/L (ref 3–11)
BUN: 13.4 mg/dL (ref 7.0–26.0)
CHLORIDE: 108 meq/L (ref 98–109)
CO2: 25 meq/L (ref 22–29)
CREATININE: 1.1 mg/dL (ref 0.6–1.1)
Calcium: 9 mg/dL (ref 8.4–10.4)
EGFR: 61 mL/min/{1.73_m2} — ABNORMAL LOW (ref >90)
Glucose: 96 mg/dl (ref 70–140)
Potassium: 3.8 mEq/L (ref 3.5–5.1)
SODIUM: 141 meq/L (ref 136–145)
Total Bilirubin: 0.42 mg/dL (ref 0.20–1.20)
Total Protein: 6.5 g/dL (ref 6.4–8.3)

## 2016-12-18 MED ORDER — SODIUM CHLORIDE 0.9% FLUSH
10.0000 mL | INTRAVENOUS | Status: DC | PRN
  Administered 2016-12-18: 10 mL
  Filled 2016-12-18: qty 10

## 2016-12-18 MED ORDER — CARBOPLATIN CHEMO INTRADERMAL TEST DOSE 100MCG/0.02ML
100.0000 ug | Freq: Once | INTRADERMAL | Status: AC
  Administered 2016-12-18: 100 ug via INTRADERMAL
  Filled 2016-12-18: qty 0.02

## 2016-12-18 MED ORDER — SODIUM CHLORIDE 0.9 % IV SOLN
Freq: Once | INTRAVENOUS | Status: AC
  Administered 2016-12-18: 12:00:00 via INTRAVENOUS

## 2016-12-18 MED ORDER — PALONOSETRON HCL INJECTION 0.25 MG/5ML
0.2500 mg | Freq: Once | INTRAVENOUS | Status: AC
  Administered 2016-12-18: 0.25 mg via INTRAVENOUS

## 2016-12-18 MED ORDER — PEGFILGRASTIM 6 MG/0.6ML ~~LOC~~ PSKT
6.0000 mg | PREFILLED_SYRINGE | Freq: Once | SUBCUTANEOUS | Status: AC
  Administered 2016-12-18: 6 mg via SUBCUTANEOUS
  Filled 2016-12-18: qty 0.6

## 2016-12-18 MED ORDER — PALONOSETRON HCL INJECTION 0.25 MG/5ML
INTRAVENOUS | Status: AC
Start: 2016-12-18 — End: 2016-12-18
  Filled 2016-12-18: qty 5

## 2016-12-18 MED ORDER — SODIUM CHLORIDE 0.9 % IV SOLN
Freq: Once | INTRAVENOUS | Status: AC
  Administered 2016-12-18: 13:00:00 via INTRAVENOUS
  Filled 2016-12-18: qty 5

## 2016-12-18 MED ORDER — HEPARIN SOD (PORK) LOCK FLUSH 100 UNIT/ML IV SOLN
500.0000 [IU] | Freq: Once | INTRAVENOUS | Status: AC | PRN
  Administered 2016-12-18: 500 [IU]
  Filled 2016-12-18: qty 5

## 2016-12-18 MED ORDER — SODIUM CHLORIDE 0.9 % IV SOLN
134.5600 mg | Freq: Once | INTRAVENOUS | Status: AC
  Administered 2016-12-18: 130 mg via INTRAVENOUS
  Filled 2016-12-18: qty 13

## 2016-12-18 MED ORDER — SODIUM CHLORIDE 0.9 % IV SOLN
500.0000 mg/m2 | Freq: Once | INTRAVENOUS | Status: AC
  Administered 2016-12-18: 912 mg via INTRAVENOUS
  Filled 2016-12-18: qty 23.99

## 2016-12-18 NOTE — Progress Notes (Signed)
Per Dr. Alvy Bimler okay to treat with ANC of 1.4 and WBC of 2.5.

## 2016-12-18 NOTE — Telephone Encounter (Signed)
-----   Message from Gaspar Bidding sent at 12/18/2016  8:45 AM EDT ----- Regarding: RE: CT not scheduled approved ----- Message ----- From: Heath Lark, MD Sent: 12/18/2016   7:35 AM To: Patton Salles, RN, Gaspar Bidding, # Subject: CT not scheduled                               pls get it scheduled by 8/15

## 2016-12-18 NOTE — Patient Instructions (Signed)
[facility] Discharge Instructions for Patients Receiving Chemotherapy  Today you received the following chemotherapy agents: Gemzar and Carboplatin   To help prevent nausea and vomiting after your treatment, we encourage you to take your nausea medication as directed.    If you develop nausea and vomiting that is not controlled by your nausea medication, call the clinic.   BELOW ARE SYMPTOMS THAT SHOULD BE REPORTED IMMEDIATELY:  *FEVER GREATER THAN 100.5 F  *CHILLS WITH OR WITHOUT FEVER  NAUSEA AND VOMITING THAT IS NOT CONTROLLED WITH YOUR NAUSEA MEDICATION  *UNUSUAL SHORTNESS OF BREATH  *UNUSUAL BRUISING OR BLEEDING  TENDERNESS IN MOUTH AND THROAT WITH OR WITHOUT PRESENCE OF ULCERS  *URINARY PROBLEMS  *BOWEL PROBLEMS  UNUSUAL RASH Items with * indicate a potential emergency and should be followed up as soon as possible.  Feel free to call the clinic you have any questions or concerns. The clinic phone number is [phone].  Please show the [facility] at check-in to the Emergency Department and triage nurse.

## 2016-12-18 NOTE — Telephone Encounter (Signed)
Radiology scheduler to call and schedule CT.

## 2016-12-24 ENCOUNTER — Ambulatory Visit (INDEPENDENT_AMBULATORY_CARE_PROVIDER_SITE_OTHER): Payer: Medicare HMO | Admitting: Pulmonary Disease

## 2016-12-24 ENCOUNTER — Encounter: Payer: Self-pay | Admitting: Pulmonary Disease

## 2016-12-24 VITALS — BP 132/64 | HR 85 | Temp 97.9°F | Ht 64.0 in | Wt 160.2 lb

## 2016-12-24 DIAGNOSIS — F17200 Nicotine dependence, unspecified, uncomplicated: Secondary | ICD-10-CM | POA: Diagnosis not present

## 2016-12-24 DIAGNOSIS — C7931 Secondary malignant neoplasm of brain: Secondary | ICD-10-CM | POA: Diagnosis not present

## 2016-12-24 DIAGNOSIS — C3412 Malignant neoplasm of upper lobe, left bronchus or lung: Secondary | ICD-10-CM | POA: Diagnosis not present

## 2016-12-24 DIAGNOSIS — J449 Chronic obstructive pulmonary disease, unspecified: Secondary | ICD-10-CM

## 2016-12-24 DIAGNOSIS — C7949 Secondary malignant neoplasm of other parts of nervous system: Secondary | ICD-10-CM | POA: Diagnosis not present

## 2016-12-24 NOTE — Patient Instructions (Signed)
Today we updated your med list in our EPIC system...    Continue your current medications the same...  Continue to work on smoking cessation!!!  Continue your ANORO daily...  Call for any questions or if we can be of service in any way.Marland KitchenMarland Kitchen

## 2016-12-24 NOTE — Progress Notes (Signed)
Subjective:     Patient ID: Lauren [name], female   DOB: 29-Aug-1950, 66 y.o.   MRN: [id number]  HPI  Cancer of upper lobe of left lung, Adenocarcinoma   03/01/2007 Procedure    Colonoscopy revealed abnormalities and biopsy show high-grade dysplasia      04/01/2007 Surgery    She underwent sigmoid resection which showed T2 N0 colon cancer, and negative margins and all of 17 lymph nodes were negative      02/29/2008 Procedure    Repeat surveillance colonoscopy was negative.      06/16/2010 Surgery    She underwent left upper lobectomy we show well-differentiated adenocarcinoma of the lung, T1, N0, M0      03/18/2011 Procedure    Repeat colonoscopy show multiple polyps but there were benign      10/19/2011 Procedure    Biopsy of mediastinal lymph node came back positive for recurrence of lung cancer, EGFR and ALK negative      11/16/2011 - 12/14/2011 Chemotherapy    She received concurrent chemoradiation therapy with weekly carboplatin and Taxol.      11/16/2011 - 12/29/2011 Radiation Therapy    She received radiation therapy with weekly chemotherapy      02/15/2012 Imaging    MR of the brain showed a new intracranial metastases. This was subsequently treated with stereotactic radiosurgery.      03/07/2012 - 04/12/2013 Chemotherapy    She received chemotherapy with maintainence Alimta every 3 weeks. Chemotherapy was discontinued due to profound fatigue      03/02/2013 Procedure    She had therapeutic ultrasound guidance thoracentesis for pleural effusion that came back negative for cancer      05/04/2013 Procedure    She had repeat ultrasound-guided thoracentesis again and cytology was negative      05/29/2013 Imaging    Repeat CT scan of the chest, abdomen and pelvis show no evidence of disease but persistent right-sided pleural effusion      06/02/2013 Surgery    The patient had placement of Pleurx  catheter and subsequently underwent pleurodesis.      09/22/2013 Imaging    Repeat CT scan show no evidence of active disease. There are nonspecific lymphadenopathy and she is placed on observation.      03/23/2014 Imaging    Repeat CT scan of the chest, abdomen and pelvis show recurrence of cancer with widespread bilateral pulmonary metastasis.      05/14/2014 Imaging    Imaging of the chest and brain were repeated due to delay of initiation of chemotherapy. Overall, chest CT scan show stable disease. MRI of the head was negative for recurrence      05/16/2014 - 07/18/2014 Chemotherapy     she completed 4 cycles of combination chemotherapy with carboplatin and Alimta      07/16/2014 Imaging     repeat CT scan of the chest, abdomen and pelvis show regression in the size of lung nodules.      08/29/2014 - 08/14/2015 Chemotherapy    She received maintenance Alimta      10/16/2014 Imaging     repeat CT scan of the chest, abdomen and pelvis show regression in the size of lung nodules.      01/29/2015 Imaging    Repeat CT scan showed stable disease      02/01/2015 Imaging    MRI brain is negative      04/30/2015 Imaging    CT scan of the chest, abdomen and pelvis showed stable disease  07/25/2015 Imaging    MRI brain showed no evidence of new disease      09/09/2015 Imaging    CT chest showed interval increase in size of large RIGHT upper lobe nodule is most consistent with lung cancer recurrence.      09/18/2015 - 03/26/2016 Chemotherapy    She started on Nivolumab      10/22/2015 Imaging    Screening mammogram showed mild abnormality      10/31/2015 Imaging    Diagnostic mammogram showed mild calcification at the right upper outer breast      11/27/2015 Imaging    Stable post treatment related changes of left lower lobectomy and radiation therapy redemonstrated, as above. No definite findings  to suggest local recurrence of disease or metastatic disease on today's examination      01/24/2016 Imaging    MR brain showed continued stable appearance of two small treated brain metastases. No new or progressive metastatic disease to the brain.      04/15/2016 Imaging    Ct scan showed findings highly suspicious for recurrent tumor in the mediastinum surrounding the right mainstem bronchus and in the region of the azygoesophageal recess. Diffuse pulmonary metastatic disease with new and progressive nodules since the prior examination.      04/24/2016 -  Chemotherapy    The patient had chemotherapy with gemzar and Carboplatin      04/30/2016 Imaging    Decreased contrast enhancement of the two treated metastases in the right frontal and left occipital lobes. No new lesions.      06/01/2016 Imaging    CXR showed numerous metastatic nodules have progressed compared to the most recent comparison chest x-ray of 07/15/2015. Majority of these were noted on the 04/15/2016 chest CT. No segmental infiltrate or pulmonary edema.      06/19/2016 Adverse Reaction    Treatment is placed on hold today due to neutropenia and the patient not feeling well      06/29/2016 Imaging    Interval decrease in size of metastatic pulmonary nodules. 2. Persistent but slightly improved right hilar/mediastinal disease. No progressive findings. 3. No findings for upper abdominal metastatic disease       Brain metastases treated with radiosurgery   02/26/2012 Initial Diagnosis    Brain metastases treated with radiosurgery      ~  July 15, 2015:  Initial pulmonary consult by SN>   75 y/o BF referred by [facility] for COPD & dyspnea> she has a hx of stage 1 colon cancer diagnosed Nov2008, and stage 4 lung cancer diagnosed Jan2012...    Evlyn was diagnosed with Adenocarcinoma of lung Dx 05/2010 w/ LULobectomy, then 10/2011 mediastinal node Bx pos for metastatic lung  cancer, given chemoradiation, then 01/2012 diagnosed w/ brain mets treated w/ stereotactic radiosurg, followed by additional chemotherapy (maintenance Altima)- stopped 03/2013 due to severe fatigue;  She developed right pleural effusion, neg cytology, drained & pleurodesis 05/2013;  CT Chest 03/2014 showed widespread pulm mets- treated w/ Carboplatin/ Altima x 4cycles last 06/2014; follow up CT scans have shown regression in size of lung nodules, then stability...     She is a current everyday smoker- starting at age 31 & smoked for 50 yrs up to 1ppd, currently smoking 1/2-1ppd; she is not interested in smoking cessation help;  Prev PFTs showed mod airflow obstruction c/w GOLD Stage 3 COPD and last CXR 12/2013 showed norm heart size, right sided pot-a-cath, right effusion, incr soft tissue changes along the right paratrachel region- stable, NAD;  Last CT Chest 04/2015 showed norm heart size, atherosclerosis in Ao & coronaries, no definite new mediastinal or hilar adenopathy, s/p left upper lobectomy, septal thickening & micronodularity throughout right lung w/o change from 01/2015, chr pleural thickening & calcif + mucoid impactions (see full report)...    Roy is c/o SOB/DOE w/ any activity- ok at rest but says she feels like there is an elephant on her body weighing her down, she feels tight, occas wheezing, min cough- mostly irritative and more at night, no sput, no hemoptysis, denies CP but she is weak & has fallen several times;  She lives alone & is here w/ her cousin today... Current meds include>  AlbutHFA prn, IRJ18, Bystolic10, ACZYS06, T01SWF, Prav40, Prilosec40, Zanaflex2 prn, Tramadol50 prn, Prozac20, Remeron15... EXAM shows Afeb, VSS, O2sat=100% on RA at rest;  HEENT- neg, mallamapti1;  Chest- sl decr BS right base, clear w/o w/r/r;  Heart- RR w/o m/r/g;  Abd- soft, nontender, neg;  Ext- VI, 1+edema;  Neuro- gait abn & neuropathy...   CXR 07/15/15>  Norm heart size, post radiation scarring on right &  bibasilar scarring w/o change from prev, clips in left hilum & right sided port-a-cath stable...  Spirometry 07/15/15>  FVC=1.58 (63%), FEV1=0.98 (50%), %1sec=62, mid-flows are reduced at 26% predicted;  This is c/w mod airflow obstruction and GOLD Stage 2-3 COPD...  Ambulatory oximetry 07/15/15 on RA>  O2sat=100% on RA at rest w/ pulse=85/min; she ambulated only 1lap, stopped 1/2 way due to dizziness & was able to continue under her own power, lowest O2sat=99% w/ pulse=90/min... IMP >>      COPD/ Emphysema>  She has mod airflow obstruction and GOLD Stage 3 COPD; try ANORO one inhalation daily + ProairHFA rescue inhaler prn...    Stage 4 Adenocarcinoma of the Lung>  Followed by DrGorsuch regularly=> see Table of treatment modalities...    Chronic right pleural effusion>  Non-malignant on thoracenteses, s/p pleur-X cath drainage and pleurodesis...    Cigarette Smoker>  She is still smoking 1/2 to 1ppd (w/ a 50+pack-yr smoking hx) and we discussed cutting back some to start...    Hx Hypoxemic Resp Failure>  She has been on Home O2 from prev hospitalization but current oxygenation at rest & with walking is OK...    MEDICAL issues>  HBP. CAD, VI/ edema, HL, GERD, Hx colon cancer, DJD, LBP, anxiety/ depression; her PCP is [facility], DrMcDiarmid... PLAN >>     We discussed the imperative to quit smoking;  She will try to cut back;  We will start Rx w/ ANORO one inhalation daily & use the PROAIR-HFA as a rescue inhaler;  Finally we discussed a trial of low dose KLONOPIN 0.61m bid to see if it relaxes her & improves her dyspnea... We will f/u in 6wks...  ~  August 28, 2015:  6wk ROV w/ SN>      OAdalyreturns having started [facility], PROAIR-HFA rescue inhaler prn, and KLONOPIN 0.556mid;  However she states that she isn't really taking any of these meds due to various perceived issues- not doing the Anoro daily (?but can't tell me why?- cost, other), and she states that she  "couldn't handle the Klonopin due to dizziness but she notes that it DID help her breathing;  She is still smoking but thinks that she's prob cut back to 1/2 ppd;  She had 3 f/u appts in March>   1) She's had f/u w/ DrGorsuch for Oncology> on maintenance chemotherapy (Altima- Q28d now) &  she says she is going to stay on this "to keep the cancer at [place]" 2) She had f/u DrSquires for RAD-ONC> s/p XRT for 2 brain mets and f/u MRI w/o recurrence; pt tells me "I am one of her miracle pts" 3) She's had f/u by her PCP- DrMcDiarmid for her dizziness, falls, 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     COPD/ Emphysema>  She has mod airflow obstruction and GOLD Stage 3 COPD; Rec- ANORO one inhalation daily + ProairHFA rescue inhaler prn...    Stage 4 Adenocarcinoma of the Lung>  Followed by DrGorsuch regularly & currently on maintenance ALTIMA Q28d regimen=> see Table of treatment modalities...    Chronic right pleural effusion>  Non-malignant on thoracenteses, s/p pleur-X cath drainage and pleurodesis...    Cigarette Smoker>  She is still smoking 1/2 to 1ppd (w/ a 50+pack-yr smoking hx) and we discussed cutting back some to start...    Hx Hypoxemic Resp Failure>  She has been on Home O2 from prev hospitalization but current oxygenation at rest & with walking is OK...    Severely deconditioned>  She has received [facility] outreach, and PT & Neuro-rehab from PCP for her deconditioning; she needs to quit all smoking and incr exercise regimen...Marland KitchenMarland KitchenMarland Kitchen     MEDICAL issues>  HBP. CAD, VI/ edema, HL, GERD, Hx colon cancer, DJD, LBP, anxiety/ depression, ANEMIA, NEUROPATHY; her PCP is [facility], DrMcDiarmid... EXAM shows Afeb, VSS, O2sat=99% on RA at rest;  HEENT- neg, mallamapti1;  Chest- sl decr BS right base, clear w/o w/r/r;  Heart- RR w/o m/r/g;  Abd- soft, nontender, neg;  Ext- VI, 1+edema;  Neuro- gait abn & neuropathy...  IMP/PLAN>>  We  discussed her progress & she is reminded of the NEED to quit all smoking, take the Anoro once daly every day, use the rescue inhaler as needed, and restart the Klonopin at 1/2 tab Bid;  Finally she needs to increase her exercise program... we will plan a routine ROV in 30mo  ~  November 28, 2015:  347moOV & OlBitanias still smoking ~1ppd despite all that she's been through; she says she feels well, breathing is good, walking w/ a cane & only prob is the heat & humidity;  She does her ADLs, light housework, walking some; She notes min cough, small amt thick beige sput, no hemoptysis; she denies CP, f/c/s, etc; she has known bronchiectasis & mucoid impactions and we reviewed how helpful it would be to quit smoking, take the max Mucinex 1200Bid w/ fluids & engage in chest physiotherpy w/ Flutter valve & postural drainage (but she declines) ... She is using her ANORO 1 inhalation daily & the Albut rescue inhaler as needed...     She saw DrGorsuch, Oncology 11/27/15> Now on treatment w/ OPDIVO & CT Chest showed good response w/ resolution of RUL nodule; they plan continued Rx QoWK w/ f/u CT in 29m42mo   EXAM shows Afeb, VSS, O2sat=100% on RA at rest;  HEENT- neg, mallamapti1;  Chest- sl decr BS right base, clear w/o w/r/r;  Heart- RR w/o m/r/g;  Abd- soft, nontender, neg;  Ext- VI, 1+edema;  Neuro- gait abn & neuropathy...   CT Chest 11/26/15>  S/p LLLobectomy & radiation changes, chronic areas of mucoid impaction throughout the right lung are similar to prev, Ao & coronary atherosclerosis. Ectasia of infrrenal Ao IMP/PLAN>>  OliAlicya rec to quit smoking & continue the Anoro &  Albut rescue prn; we reviewed the benefits derived from smojking cessation + Mucinex/ fluids/ chest physiotherapy/ etc but she declines these latter treatments... we will plan ROV recheck in 46mo sooner prn...   ~  June 01, 2016:  638moOV & pulmonary follow up visit>  Unfortunately OlBreeaas had recurrence of her metastatic lung cancer  (adenoca) & DrGorsuch has switched her Altima to Gemzar & Cisplatin...     COPD/ Emphysema>  OlShanyontinues to smoke (albeit now down to 1/2 ppd) & has been extensively counseled by mult physicians on mult occas to no avail; she take [facility] daily & reports that her breathing is at baseline- stoic, she denies cough, sput, hemoptysis or SOB; notes chr stable DOE, no f/c/s, no CP, etc...    Problems as noted above... EXAM shows Afeb, VSS, O2sat=100% on RA at rest;  HEENT- neg, mallamapti1;  Chest- sl decr BS right base, clear w/o w/r/r;  Heart- RR w/o m/r/g;  Abd- soft, nontender, neg;  Ext- VI, 1+edema;  Neuro- gait abn & neuropathy...   CT Chest 04/15/16 showed norm heart size, advanced atherosclerotic calcif w/ tortuosity & ectasia of ao, 3 vessel CAD; numerous new pulm & enlarging nodules c/w widespread metastatic dis, +mediastinal changes...  MRI Brain 04/30/16 showed decreased contrast enhancement of the two treated mets in right frontal & left occipital lobes, no new lesions noted...  CXR 06/01/16 ((independently reviewed by me in the PACS system) showed norm heart size, tort Ao, numerous metastatic nodules are thought to have progressed compared to CXR of 06/2015 IMP/PLAN>>  Recurrent metastatic Adenoca of lung on Chemo per DrGorsuch; OlRithikas implored to quit all smoking & take the ANHan[facility] Hospitalaily; she will call for any resp issues 7 we will plan rov recheck in 31m10mo  ~  September 09, 2016:  31mo631mo & OlivChudneytinues to smoke 1ppd stating "I'm like a crack-head and there's no rehab center. God's looking out for me" she tells me about a friend who recently passed w/ HepC, cirrhosis & liver cancer...  Marland KitchenMarland Kitchen 1) she had f/u ONCOLOGY-DrGorsuch 08/21/16> Hx colon cancer 2008 w/ sigmoid resection, Hx lung cancer 2012 w/ LN & brain mets 2013; she remains on Gemzar & carboplatin + neulasta; she reports "it's rough"...     2) she went to the ER 4/14May 12, 2018er her friend died, c/o SOB, couldn't get a deep breath,  exam was clear, O2sat=100% on RA, CXR w/o change from [id number] NEB treatment & supportive care...     3) [facility]> RN coach phone calls reviewed...    COPD/ Emphysema>  She has mod airflow obstruction and GOLD Stage 3 COPD; Rec- ANORO one inhalation daily + ProairHFA rescue inhaler prn; PCP added Atrovent-HFA for prn use as well...    Stage 4 Adenocarcinoma of the Lung>  Followed by DrGorsuch regularly & currently on Gemzar, Carboplatin, Neulasta regimen=> see Table of treatment timeline...    Chronic right pleural effusion>  Non-malignant on thoracenteses, s/p pleur-X cath drainage and pleurodesis...    Cigarette Smoker>  She is still smoking 1/2 to 1ppd (w/ a 50+pack-yr smoking hx) and we discussed cutting back some to start...    Hx Hypoxemic Resp Failure>  She has been on Home O2 from prev hospitalization but current oxygenation at rest & with walking is OK...    Severely deconditioned>  [facility] & Neuro-rehab from PCP for her deconditioning; she needs to quit all  smoking and incr exercise regimen...Marland KitchenMarland KitchenMarland Kitchen     MEDICAL issues>  HBP. CAD, VI/ edema, HL, GERD, Hx colon cancer, DJD, LBP, anxiety/ depression, ANEMIA, NEUROPATHY; her PCP is [facility], DrMcDiarmid... EXAM shows Afeb, VSS, O2sat=100% on RA at rest;  HEENT- neg, mallamapti1;  Chest- sl decr BS right base, clear w/o w/r/r;  Heart- RR w/o m/r/g;  Abd- soft, nontender, neg;  Ext- VI, 1+edema;  Neuro- gait abn & neuropathy...   CXR 08/29/16>  Norm heart size, right perihilar & paratracheal soft tissue fullness w/o change, sm right pleural effusion, mild bibasilar atx, right sided port-a-cath, NAD...   LABS 08/2016 in epic> Chems- ok x Alb=3.0;  CBC- anemic w/ Hg=9.3 IMP/PLAN>>  Calleen desperately need to quit smoking but has no intention of trying; REC to use the Ventolin & Atrovent 2 sprays of each Bid, and the ANORO once daily...    ~  December 24, 2016:  2moROV & Bryssa  reports stable- unfortunately still smoking 1ppd  (can't vs won't quit) but also using her ANORO daily & the Atrovent/ VentolinHFA as needed;  She [place] & had a great time!  She notes that her breathing is about the same- mild cough (esp in the eve), small amt beige sputum w/o blood, denies f/c/s, denies CP, and notes that her SOB/ DOE is about the same;  She ambulates w/ a rolling walker, does not require Oxygen (O2sat today on RA= 97%), notes SOB if rushing, hills, stairs, etc...Marland Kitchen   1) [facility] concerns regarding her cancer, youngest son who lives w/ her, counseling given...    2) she went to the ER 11/06/16 c/o nausea, weak, dizzy- after getting her MRI Brain; Labs showed K=3.4, Hg=9.4, MRI Brain showed stable treated right insula & left occipital mets, mild atrophy & small vessel dis, no new lesions;  She was given IVF & Zofran=> improved...    3) she had f/u XRT- DrSquires 11/11/16> XRT to brain was completed 02/2012 & she continues to do well...     4) she last saw DrGorsuch 11/20/16> note reviewed, Stage 1 colon cancer 05/2013 & Stage IV lung cancer, adenocarcinoma LUL w/ LULobectomy 05/2010, pos bx mediastinal LN 10/2011 treated w/ chemoRx & XRT; MRI brain w/ brain mets noted 01/2012- treated w/ XRT & more chemorx; widespread pulm mets found 03/2014=> more chemorx & improved; SEE ONCOLOGY CHARTING... Last CT Chest was 09/2016 showing incr size of the RLL nodule ~2.5cm now, other smaller nodules bilat are unchanged (+scarring LUL and emphysema)- she is due for f/u CT chest later this month...     COPD/ Emphysema>  She has mod airflow obstruction and GOLD Stage 3 COPD; Rec- ANORO one inhalation daily + ProairHFA rescue inhaler prn; PCP added Atrovent-HFA for prn use as well...    Stage 4 Adenocarcinoma of the Lung>  Followed by DrGorsuch regularly & currently on Gemzar, Carboplatin, Neulasta regimen=> see Table of treatment  timeline...    Chronic right pleural effusion>  Non-malignant on thoracenteses, s/p pleur-X cath drainage and pleurodesis...    Cigarette Smoker>  She is still smoking 1ppd (w/ a 50+pack-yr smoking hx) and we discussed cutting back some to start...    Hx Hypoxemic Resp Failure>  She has been on Home O2 from prev hospitalization but current oxygenation at rest & with walking is OK...    Severely deconditioned>  [facility] PT &  Neuro-rehab from PCP for her deconditioning; she needs to quit all smoking and incr exercise regimen...Marland KitchenMarland KitchenMarland Kitchen     MEDICAL issues>  HBP. 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 EXAM shows Afeb, VSS, O2sat=97% on RA at rest;  HEENT- neg, mallamapti1;  Chest- sl decr BS right base, clear w/o w/r/r;  Heart- RR w/o m/r/g;  Abd- soft, nontender, neg;  Ext- VI, 1+edema;  Neuro- gait abn & neuropathy...  IMP/PLAN>>  Scans and Labs per Oncology-DrGorsuch;  Doloris knows that she needs to quit all smoking but is clearly NOT motivated to do so;  Continue ANORO daily + VentolinHFA resuce inhaler as needed;  If intrathoracic dis is progressing despite her Chemo protocol- it might be time to consider Hospice services... NOTE:  >50% of this 17mn rov was spent in counseling & coordination of care...     Past Medical History:  Diagnosis Date  . Abnormality of gait 09/10/2015  . Acute on chronic respiratory failure with hypoxemia (HMount Croghan 07/11/2015  . Anemia due to chemotherapy 08/29/2014  . 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 Angina pectoris (HVilas 03/05/2016  . Arthritis   . ASCUS with positive high risk HPV 04/21/2012   colpo clinically normal 04/2012.  Given her other significant co morbidities (recurrent lung cancer, brain mets and colon cancer requiring chronic chemo) I don't think I would be very aggressive in pursuing any more paps.  If she felt necessary, could do a repeat in one year, but the likelihood is that this is the least of her problems and I doubt she will benefit from repeated paps and colpos and I sincerely doubt anything we find would change the course of her life. We discussed. She has already decided to consider stopping chemo in May because after 5 years she is quite tired of it. I would always be willing to do a pap, but my recommendation is to stop.   . Back pain   . Benign paroxysmal positional vertigo 08/16/2015  . Bilateral hearing loss, Mild 02/27/2016   03/23/16 Hearing loss Evaluation by DUnice Bailey MD, [facility], Dx with mild bilateral hearing loss except normal hearing in right ear of 500 Hz and 1000 Hz.  Dr BRedmond Basemanrecommends annual hearing evaluations.   . Bilateral leg edema 10/17/2014  . Bilateral pleural effusion 08/09/2013  . Blepharitis of both eyes 11/09/2014  . Blurred vision, bilateral 06/06/2014  . Brain metastases (HWatkins 02/15/12  . Cerebral aneurysm   . CEREBRAL ANEURYSM 12/29/2006   Qualifier: Diagnosis of  By: RGirard CooterMD, MAKEECHA    . Chronic dermatitis of hands 02/27/2016  . Chronic folliculitis    of groin  . Colon cancer ([place] 11/08  . COPD (chronic obstructive pulmonary disease) ([place]Black[place]   . COPD mixed type ([place] 07/15/2015  . Coronary artery disease   . Coronary atherosclerosis 09/09/2007   Qualifier: Diagnosis of  By: JMartinique Bonnie    . Depression   . Depressive disorder   . DEPRESSIVE DISORDER, MAJOR, RCR, MILD 12/29/2006   Qualifier: Diagnosis of  By: RGirard CooterMD, MAKEECHA    . Dyspnea 07/15/2015  . Encounter for antineoplastic chemotherapy 09/11/2015  . Encounter for therapeutic drug monitoring 09/11/2015  . Essential hypertension 12/29/2006   Echocardiogram (07/26/13): Left ventricle: The cavity size was normal. Wall thickness was increased in a pattern of mild LVH. Systolic function was normal. The  estimated ejection fraction was in the range of 55%  to 60%. Wall motion was normal; there were no regional wall motion abnormalities. Normal mitral valve.    . Falls frequently 08/02/2015  . Family history of Huntington's disease   . Family history of trichomonal vaginitis 05/2005  . Fatigue 02/06/2013  . 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 Fibrocystic breast changes   . GERD 12/29/2006   Qualifier: Diagnosis of  By: Girard Cooter MD, MAKEECHA    . GERD (gastroesophageal reflux disease)   . History of colon cancer 05/31/2013  . Hypercholesterolemia   . HYPERCHOLESTEROLEMIA 12/29/2006   Qualifier: Diagnosis of  By: Girard Cooter MD, MAKEECHA    . Hyperlipidemia   . Hypertension   . Hypokalemia 08/09/2013  . Insomnia 10/05/2011  . Left-sided low back pain with left-sided sciatica 07/18/2014  . Low back pain with sciatica 06/22/2014  . Lung cancer ([place]) 06/16/10   PET scan 04/28/2010; primary: increase in size 02/2010 / Well Differentiated Adenocarcinoma of the lung   . Lung nodule    FNA ordered for 04/02/10 by HA>pos Ca  . LVH (left ventricular hypertrophy) 12/20/2014   Echocardiogram (07/26/13): Left ventricle: Wall thickness was increased in a pattern of mild LVH. The cavity size was normal. Systolic function was normal. The estimated ejection fraction was in the range of 55% to 60%. Wall motion was normal; there were no regional wall motion abnormalities. Normal mitral valve.   . On antineoplastic chemotherapy started 02/2012   [place]  . Other fatigue 11/28/2014  . Pleural effusion 05/03/2013  . 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 Postmenopausal   . POSTMENOPAUSAL SYNDROME 01/31/2009   Qualifier: Diagnosis of  By: Carlena Sax  MD, Colletta [place]    . Postural dizziness 06/19/2015  . Protein-calorie malnutrition, moderate ([place]) 09/11/2015  . S/P radiation therapy 03/01/12   SRS: 1 fraction / 20 Gray each to the Left Occipital Region and to the Right Insular Metastases  . Sensorineural hearing loss (SNHL) of both ears  04/23/2016   Dr Keturah Barre. [facility] (ENT) dx significant bilateral hearing loss and recommended biaural hearing aids.   . Shortness of breath   . Skin rash 10/03/2015  . Status post chemotherapy comp. 12/29/11   Carboplatin/Taxol  . Status post radiation therapy 11/16/11 - 12/29/11   Right Lung and Mediastinum: 60 Gy  . Status post stereotactic radiosurgery 01/2012   for Brain Metastases  . Tobacco dependence   . Vitamin D deficiency 08/30/2015  . Weight loss 10/22/2011    Past Surgical History:  Procedure Laterality Date  . BACK SURGERY     Dr Luiz Ochoa  . BREAST SURGERY     Bil lumpectomy  . cardiac cath x3    . CHEST TUBE INSERTION Right 06/12/2013   Procedure: INSERTION PLEURAL DRAINAGE CATHETER;  Surgeon: Gaye Pollack, MD;  Location: [place];  Service: Thoracic;  Laterality: Right;  . COLECTOMY  03/22/07   Stage 1 pT2 N0, M0 Adenocarcinoma of the sigmoid  colon  . HERNIA REPAIR    . LUNG LOBECTOMY  06/16/10   Left Upper Lobectomy  . MEDIASTINOSCOPY  10/19/2011   Procedure: MEDIASTINOSCOPY;  Surgeon: Gaye Pollack, MD;  Location: [place];  Service: Thoracic;  Laterality: N/A;  . TALC PLEURODESIS Right 06/12/2013   Procedure: Pietro Cassis;  Surgeon: Gaye Pollack, MD;  Location: [place];  Service: Thoracic;  Laterality: Right;  . TUBAL LIGATION    . TUNNELED VENOUS CATHETER PLACEMENT  Port-a-Cath    Outpatient Encounter Prescriptions as of 12/24/2016  Medication Sig  . BYSTOLIC 10 MG tablet Take 10 mg by mouth daily.   . diclofenac sodium (VOLTAREN) 1 % GEL Apply 2 g topically 4 (four) times daily as needed. (Patient taking differently: Apply 2 g topically 4 (four) times daily as needed (pain). )  . HYDROmorphone (DILAUDID) 8 MG tablet Take 1 tablet (8 mg total) by mouth every 6 (six) hours as needed for severe pain.  Marland Kitchen ipratropium (ATROVENT HFA) 17 MCG/ACT inhaler Inhale 2 puffs into the lungs every 4 (four) hours as needed for wheezing (Cough).  . lidocaine-prilocaine (EMLA) cream APPLY  TOPICALLY AS NEEDED TO PORTA CATH SITE 1 HOUR PRIOR TO NEEDLE STICK.  Marland Kitchen loratadine (CLARITIN) 10 MG tablet Take 10 mg by mouth daily.  . nitroGLYCERIN (NITROSTAT) 0.4 MG SL tablet Place 1 tablet (0.4 mg total) under the tongue every 5 (five) minutes as needed. For chest pain  . omeprazole (PRILOSEC) 40 MG capsule Take 40 mg by mouth daily.   . ondansetron (ZOFRAN ODT) 4 MG disintegrating tablet Take 1 tablet (4 mg total) by mouth every 8 (eight) hours as needed for nausea or vomiting.  . pravastatin (PRAVACHOL) 40 MG tablet Take 40 mg by mouth daily.   Marland Kitchen umeclidinium-vilanterol (ANORO ELLIPTA) 62.5-25 MCG/INH AEPB Inhale 1 puff into the lungs daily.  . VENTOLIN HFA 108 (90 Base) MCG/ACT inhaler Inhale 2 puffs into the lungs every 4 (four) hours as needed for wheezing or shortness of breath.   Facility-Administered Encounter Medications as of 12/24/2016  Medication  . sodium chloride 0.9 % injection 10 mL  . sodium chloride 0.9 % injection 10 mL    Allergies  Allergen Reactions  . Adhesive [Tape] Rash  . Lisinopril Cough    Current Medications, Allergies, Past Medical History, Past Surgical History, Family History, and Social History were reviewed in [employer] record.   Review of Systems             All symptoms NEG except where BOLDED >>  Constitutional:  F/C/S, fatigue, anorexia, unexpected weight change. HEENT:  HA, visual changes, hearing loss, earache, nasal symptoms, sore throat, mouth sores, hoarseness. Resp:  cough, sputum, hemoptysis; SOB, tightness, wheezing. Cardio:  CP, palpit, DOE, orthopnea, edema. GI:  N/V/D/C, blood in stool; reflux, abd pain, distention, gas. GU:  dysuria, freq, urgency, hematuria, flank pain, voiding difficulty. MS:  joint pain, swelling, tenderness, decr ROM; neck pain, back pain, etc. Neuro:  HA, tremors, seizures, dizziness, syncope, weakness, numbness, gait abn. Skin:  suspicious lesions or skin rash. Heme:  adenopathy,  bruising, bleeding. Psyche:  confusion, agitation, sleep disturbance, hallucinations, anxiety, depression suicidal.   Objective:   Physical Exam       Vital Signs:  Reviewed...   General:  WD, WN, 66 y/o BF in NAD, chr ill appearing; alert & oriented; pleasant & cooperative... HEENT:  [place]/AT; Conjunctiva- pink, Sclera- nonicteric, EOM-wnl, PERRLA, EACs-wax, TMs-no vis; NOSE-clear; THROAT-clear & wnl.  Neck:  Supple w/ decr ROM; no JVD; normal carotid impulses w/o bruits; no thyromegaly or nodules palpated; no lymphadenopathy.  Chest:  Sl decr BS right base, otherw clear without wheezes, rales, or rhonchi heard. Heart:  Regular Rhythm; norm S1 & S2 without murmurs, rubs, or gallops detected. Abdomen:  Soft & nontender- no guarding or rebound; normal bowel sounds; no organomegaly or masses palpated. Ext:  decr ROM; without deformities, +arthritic changes; no varicose veins, +venous insuffic, 1+edema;  Pulses intact  w/o bruits. Neuro:  CNs II-XII intact; motor testing normal; sensory testing abnormal; gait abnormal & balance if fair Derm:  No lesions noted; no rash etc. Lymph:  No cervical, supraclavicular, axillary, or inguinal adenopathy palpated.   Assessment:      IMP >>      COPD/ Emphysema>  She has mod airflow obstruction and GOLD Stage 3 COPD; Rec- continue ANORO one inhalation daily + ProairHFA rescue inhaler prn...    Stage 4 Adenocarcinoma of the Lung>  Followed by DrGorsuch regularly & currently on maintenance OPDIVO Q14d regimen=> see Table of treatment modalities...    Chronic right pleural effusion>  Non-malignant on thoracenteses, s/p pleur-X cath drainage and pleurodesis...    Cigarette Smoker>  She is still smoking 1ppd (w/ a 50+pack-yr smoking hx) and we discussed cutting back some to start...    Hx Hypoxemic Resp Failure>  She has been on Home O2 from prev hospitalization but current oxygenation at rest & with walking is OK...    Severely deconditioned>  She has received  [facility] outreach, and PT & Neuro-rehab from PCP for her deconditioning; she needs to quit all smoking and incr exercise regimen...Marland KitchenMarland KitchenMarland Kitchen     MEDICAL issues>  HBP. CAD, VI/ edema, HL, GERD, Hx colon cancer, DJD, LBP, anxiety/ depression, ANEMIA, NEUROPATHY; her [facility]CP is [facility], DrMcDiarmid...  PLAN >>  07/15/15>   We discussed the imperative to quit smoking;  She will try to cut back;  We will start Rx w/ ANORO one inhalation daily & use the PROAIR-HFA as a rescue inhaler;  Finally we discussed a trial of low dose KLONOPIN 0.35m bid to see if it relaxes her & improves her dyspnea... 08/28/15>   We discussed her progress & she is reminded of the NEED to quit all smoking, take the Anoro once daly every day, use the rescue inhaler as needed, and restart the Klonopin at 1/2 tab Bid;  Finally she needs to increase her exercise program... . 11/28/15>   OAlvirais rec to quit smoking & continue the Anoro & Albut rescue prn; we reviewed the benefits derived from smojking cessation + Mucinex/ fluids/ chest physiotherapy/ etc but she declines these latter treatments... we will plan ROV recheck in [id number] prn 06/01/16>   Recurrent metastatic Adenoca of lung on Chemo per DrGorsuch;  OlCordellas implored to quit all smoking & [facility]; she will call for any resp issues 7 we will plan rov recheck in [id number]>   Shawnita desperately need to quit smoking but has no intention of trying; REC to use the Ventolin & Atrovent 2 sprays of each Bid, and the ANORO once daily. 12/24/16>   Scans and Labs per Oncology-DrGorsuch;  OliJainaows that she needs to quit all smoking but is clearly NOT motivated to do so;  Continue ANORO daily + VentolinHFA resuce inhaler as needed;  If intrathoracic dis is progressing despite her Chemo protocol- it might be time to consider Hospice services.   Plan:     Patient's Medications  New Prescriptions   No medications on file  Previous Medications   BYSTOLIC 10 MG TABLET     Take 10 mg by mouth daily.    DICLOFENAC SODIUM (VOLTAREN) 1 % GEL    Apply 2 g topically 4 (four) times daily as needed.   HYDROMORPHONE (DILAUDID) 8 MG TABLET    Take 1 tablet (8 mg total) by mouth every 6 (six) hours as needed for severe pain.  IPRATROPIUM (ATROVENT HFA) 17 MCG/ACT INHALER    Inhale 2 puffs into the lungs every 4 (four) hours as needed for wheezing (Cough).   LIDOCAINE-PRILOCAINE (EMLA) CREAM    APPLY TOPICALLY AS NEEDED TO PORTA CATH SITE 1 HOUR PRIOR TO NEEDLE STICK.   LORATADINE (CLARITIN) 10 MG TABLET    Take 10 mg by mouth daily.   NITROGLYCERIN (NITROSTAT) 0.4 MG SL TABLET    Place 1 tablet (0.4 mg total) under the tongue every 5 (five) minutes as needed. For chest pain   OMEPRAZOLE (PRILOSEC) 40 MG CAPSULE    Take 40 mg by mouth daily.    ONDANSETRON (ZOFRAN ODT) 4 MG DISINTEGRATING TABLET    Take 1 tablet (4 mg total) by mouth every 8 (eight) hours as needed for nausea or vomiting.   PRAVASTATIN (PRAVACHOL) 40 MG TABLET    Take 40 mg by mouth daily.    UMECLIDINIUM-VILANTEROL (ANORO ELLIPTA) 62.5-25 MCG/INH AEPB    Inhale 1 puff into the lungs daily.   VENTOLIN HFA 108 (90 BASE) MCG/ACT INHALER    Inhale 2 puffs into the lungs every 4 (four) hours as needed for wheezing or shortness of breath.  Modified Medications   No medications on file  Discontinued Medications   No medications on file

## 2016-12-29 ENCOUNTER — Other Ambulatory Visit: Payer: Self-pay | Admitting: *Deleted

## 2016-12-29 NOTE — Patient Outreach (Signed)
[name]) Care Management  12/29/2016  [name] [birth year] [id number]   CSW called to follow-up with patient regarding community resources. Patient reports that she called Wells Guiles with [facility] and she has even already come out to clean. Per Nicholaus [facility] [employer] will provide 3 cleaning sessions, which patient plans to spread out over the next few months. CSW also spoke with patient regarding the difference between hospice & palliative care and encouraged patient to discuss palliative care with Dr. Alvy Bimler on her next appointment this Friday.   CSW will follow-up with patient within 3 weeks to ensure palliative care referral had been made if requested.    [name], LCSW [facility]  Clinical Social Worker cell #: [phone]

## 2016-12-30 ENCOUNTER — Ambulatory Visit (HOSPITAL_COMMUNITY)
Admission: RE | Admit: 2016-12-30 | Discharge: 2016-12-30 | Disposition: A | Discharge status: Home / Self Care | Payer: Medicare HMO | Source: Ambulatory Visit | Attending: Hematology and Oncology | Admitting: Hematology and Oncology

## 2016-12-30 DIAGNOSIS — I700 Atherosclerosis of aorta: Secondary | ICD-10-CM | POA: Insufficient documentation

## 2016-12-30 DIAGNOSIS — I2510 Atherosclerotic heart disease of native coronary artery without angina pectoris: Secondary | ICD-10-CM | POA: Insufficient documentation

## 2016-12-30 DIAGNOSIS — C3431 Malignant neoplasm of lower lobe, right bronchus or lung: Secondary | ICD-10-CM | POA: Diagnosis not present

## 2016-12-30 DIAGNOSIS — R918 Other nonspecific abnormal finding of lung field: Secondary | ICD-10-CM | POA: Insufficient documentation

## 2016-12-30 DIAGNOSIS — C3412 Malignant neoplasm of upper lobe, left bronchus or lung: Secondary | ICD-10-CM

## 2016-12-30 MED ORDER — IOPAMIDOL (ISOVUE-300) INJECTION 61%
75.0000 mL | Freq: Once | INTRAVENOUS | Status: AC | PRN
  Administered 2016-12-30: 75 mL via INTRAVENOUS

## 2016-12-30 MED ORDER — IOPAMIDOL (ISOVUE-300) INJECTION 61%
INTRAVENOUS | Status: AC
  Filled 2016-12-30: qty 75

## 2016-12-31 ENCOUNTER — Other Ambulatory Visit: Payer: Self-pay | Admitting: *Deleted

## 2016-12-31 ENCOUNTER — Encounter: Payer: Self-pay | Admitting: *Deleted

## 2017-01-01 ENCOUNTER — Other Ambulatory Visit (HOSPITAL_BASED_OUTPATIENT_CLINIC_OR_DEPARTMENT_OTHER): Payer: Medicare HMO

## 2017-01-01 ENCOUNTER — Ambulatory Visit (HOSPITAL_BASED_OUTPATIENT_CLINIC_OR_DEPARTMENT_OTHER): Payer: Medicare HMO | Admitting: Hematology and Oncology

## 2017-01-01 ENCOUNTER — Ambulatory Visit: Payer: Medicare HMO

## 2017-01-01 ENCOUNTER — Telehealth: Payer: Self-pay

## 2017-01-01 ENCOUNTER — Other Ambulatory Visit: Payer: Self-pay | Admitting: *Deleted

## 2017-01-01 ENCOUNTER — Ambulatory Visit (HOSPITAL_BASED_OUTPATIENT_CLINIC_OR_DEPARTMENT_OTHER): Payer: Medicare HMO

## 2017-01-01 ENCOUNTER — Encounter: Payer: Self-pay | Admitting: Hematology and Oncology

## 2017-01-01 VITALS — BP 146/58 | HR 99 | Temp 97.7°F | Resp 18 | Ht 64.0 in | Wt 159.7 lb

## 2017-01-01 DIAGNOSIS — Z720 Tobacco use: Secondary | ICD-10-CM | POA: Diagnosis not present

## 2017-01-01 DIAGNOSIS — D6481 Anemia due to antineoplastic chemotherapy: Secondary | ICD-10-CM | POA: Diagnosis not present

## 2017-01-01 DIAGNOSIS — G893 Neoplasm related pain (acute) (chronic): Secondary | ICD-10-CM | POA: Diagnosis not present

## 2017-01-01 DIAGNOSIS — C7931 Secondary malignant neoplasm of brain: Secondary | ICD-10-CM

## 2017-01-01 DIAGNOSIS — F411 Generalized anxiety disorder: Secondary | ICD-10-CM | POA: Diagnosis not present

## 2017-01-01 DIAGNOSIS — R05 Cough: Secondary | ICD-10-CM | POA: Diagnosis not present

## 2017-01-01 DIAGNOSIS — C3412 Malignant neoplasm of upper lobe, left bronchus or lung: Secondary | ICD-10-CM

## 2017-01-01 DIAGNOSIS — G4700 Insomnia, unspecified: Secondary | ICD-10-CM

## 2017-01-01 DIAGNOSIS — Z7189 Other specified counseling: Secondary | ICD-10-CM

## 2017-01-01 DIAGNOSIS — F17200 Nicotine dependence, unspecified, uncomplicated: Secondary | ICD-10-CM

## 2017-01-01 DIAGNOSIS — C7949 Secondary malignant neoplasm of other parts of nervous system: Secondary | ICD-10-CM

## 2017-01-01 DIAGNOSIS — T451X5A Adverse effect of antineoplastic and immunosuppressive drugs, initial encounter: Secondary | ICD-10-CM

## 2017-01-01 LAB — COMPREHENSIVE METABOLIC PANEL
ALBUMIN: 2.8 g/dL — AB (ref 3.5–5.0)
ALK PHOS: 148 U/L (ref 40–150)
ANION GAP: 10 meq/L (ref 3–11)
AST: 10 U/L (ref 5–34)
BUN: 9.5 mg/dL (ref 7.0–26.0)
CALCIUM: 8.7 mg/dL (ref 8.4–10.4)
CO2: 22 mEq/L (ref 22–29)
CREATININE: 1 mg/dL (ref 0.6–1.1)
Chloride: 108 mEq/L (ref 98–109)
EGFR: 66 mL/min/{1.73_m2} — ABNORMAL LOW (ref >90)
Glucose: 121 mg/dl (ref 70–140)
Potassium: 3.2 mEq/L — ABNORMAL LOW (ref 3.5–5.1)
Sodium: 140 mEq/L (ref 136–145)
Total Bilirubin: 0.31 mg/dL (ref 0.20–1.20)
Total Protein: 6.3 g/dL — ABNORMAL LOW (ref 6.4–8.3)

## 2017-01-01 LAB — CBC WITH DIFFERENTIAL/PLATELET
BASO%: 0.6 % (ref 0.0–2.0)
BASOS ABS: 0 10*3/uL (ref 0.0–0.1)
EOS%: 0.8 % (ref 0.0–7.0)
Eosinophils Absolute: 0 10*3/uL (ref 0.0–0.5)
HEMATOCRIT: 31.8 % — AB (ref 34.8–46.6)
HEMOGLOBIN: 9.9 g/dL — AB (ref 11.6–15.9)
LYMPH#: 0.7 10*3/uL — AB (ref 0.9–3.3)
LYMPH%: 11.6 % — ABNORMAL LOW (ref 14.0–49.7)
MCH: 26.5 pg (ref 25.1–34.0)
MCHC: 31.1 g/dL — ABNORMAL LOW (ref 31.5–36.0)
MCV: 85.2 fL (ref 79.5–101.0)
MONO#: 0.6 10*3/uL (ref 0.1–0.9)
MONO%: 10.4 % (ref 0.0–14.0)
NEUT#: 4.4 10*3/uL (ref 1.5–6.5)
NEUT%: 76.6 % (ref 38.4–76.8)
PLATELETS: 149 10*3/uL (ref 145–400)
RBC: 3.74 10*6/uL (ref 3.70–5.45)
RDW: 23.6 % — AB (ref 11.2–14.5)
WBC: 5.7 10*3/uL (ref 3.9–10.3)

## 2017-01-01 MED ORDER — SODIUM CHLORIDE 0.9 % IJ SOLN
10.0000 mL | INTRAMUSCULAR | Status: DC | PRN
  Administered 2017-01-01: 10 mL via INTRAVENOUS
  Filled 2017-01-01: qty 10

## 2017-01-01 MED ORDER — LORAZEPAM 0.5 MG PO TABS: 0.5000 mg | ORAL_TABLET | Freq: Three times a day (TID) | ORAL | 1 refills | Status: AC | PRN

## 2017-01-01 MED ORDER — MIRTAZAPINE 15 MG PO TABS: 15.0000 mg | ORAL_TABLET | Freq: Every day | ORAL | 11 refills | Status: DC

## 2017-01-01 NOTE — Progress Notes (Signed)
[place] OFFICE PROGRESS NOTE  Patient Care Team: McDiarmid, Blane Ohara, MD as PCP - General (Family Medicine) Gaye Pollack, MD (Cardiothoracic Surgery) Dickie La, MD (Family Medicine) Heath Lark, MD as Consulting Physician (Hematology and Oncology) Pleasant, Eppie Gibson, RN as [place], [place], [place] (Optometry) Milus Banister, MD as Consulting Physician (Gastroenterology) Melida Quitter, MD as Consulting Physician (Otolaryngology) Standley Brooking, LCSW as [place] [facility] (Licensed Clinical Social Worker)  SUMMARY OF ONCOLOGIC HISTORY: Oncology History   Colon cancer   Primary site: Colon and Rectum (Left)   Staging method: AJCC 7th Edition   Clinical: Stage I (T2, N0, M0) signed by Heath Lark, MD on 05/31/2013  2:39 PM   Pathologic: Stage I (T2, N0, cM0) signed by Heath Lark, MD on 05/31/2013  2:39 PM   Summary: Stage I (T2, N0, cM0) Lung cancer, EGFR/ALK negative, recurrence after initial resection to LN and brain   Primary site: Lung (Left)   Staging method: AJCC 7th Edition   Clinical: Stage IV (T1, N2, M1b) signed by Heath Lark, MD on 05/31/2013  2:26 PM   Pathologic: Stage IV (T1, N2, M1b) signed by Heath Lark, MD on 05/31/2013  2:26 PM   Summary: Stage IV (T1, N2, M1b)       Cancer of upper lobe of left lung, Adenocarcinoma   03/01/2007 Procedure    Colonoscopy revealed abnormalities and biopsy show high-grade dysplasia      04/01/2007 Surgery    She underwent sigmoid resection which showed T2 N0 colon cancer, and negative margins and all of 17 lymph nodes were negative      02/29/2008 Procedure    Repeat surveillance colonoscopy was negative.      06/16/2010 Surgery    She underwent left upper lobectomy we show well-differentiated adenocarcinoma of the lung, T1, N0, M0      03/18/2011 Procedure    Repeat colonoscopy show multiple polyps but there were benign      10/19/2011  Procedure    Biopsy of mediastinal lymph node came back positive for recurrence of lung cancer, EGFR and ALK negative      11/16/2011 - 12/14/2011 Chemotherapy    She received concurrent chemoradiation therapy with weekly carboplatin and Taxol.      11/16/2011 - 12/29/2011 Radiation Therapy    She received radiation therapy with weekly chemotherapy      02/15/2012 Imaging    MR of the brain showed a new intracranial metastases. This was subsequently treated with stereotactic radiosurgery.      03/07/2012 - 04/12/2013 Chemotherapy    She received chemotherapy with maintainence Alimta every 3 weeks. Chemotherapy was discontinued due to profound fatigue      03/02/2013 Procedure    She had therapeutic ultrasound guidance thoracentesis for pleural effusion that came back negative for cancer      05/04/2013 Procedure    She had repeat ultrasound-guided thoracentesis again and cytology was negative      05/29/2013 Imaging    Repeat CT scan of the chest, abdomen and pelvis show no evidence of disease but persistent right-sided pleural effusion      06/02/2013 Surgery    The patient had placement of Pleurx catheter and subsequently underwent pleurodesis.      09/22/2013 Imaging    Repeat CT scan show no evidence of active disease. There are nonspecific lymphadenopathy and she is placed on observation.      03/23/2014 Imaging  Repeat CT scan of the chest, abdomen and pelvis show recurrence of cancer with widespread bilateral pulmonary metastasis.      05/14/2014 Imaging    Imaging of the chest and brain were repeated due to delay of initiation of chemotherapy. Overall, chest CT scan show stable disease. MRI of the head was negative for recurrence      05/16/2014 - 07/18/2014 Chemotherapy     she completed 4 cycles of combination chemotherapy with carboplatin and Alimta      07/16/2014 Imaging     repeat CT scan of the chest, abdomen and pelvis show regression in the size of lung  nodules.      08/29/2014 - 08/14/2015 Chemotherapy    She received maintenance Alimta      10/16/2014 Imaging     repeat CT scan of the chest, abdomen and pelvis show regression in the size of lung nodules.      01/29/2015 Imaging    Repeat CT scan showed stable disease      02/01/2015 Imaging    MRI brain is negative      04/30/2015 Imaging    CT scan of the chest, abdomen and pelvis showed stable disease      07/25/2015 Imaging    MRI brain showed no evidence of new disease      09/09/2015 Imaging    CT chest showed interval increase in size of large RIGHT upper lobe nodule is most consistent with lung cancer recurrence.      09/18/2015 - 03/26/2016 Chemotherapy    She started on Nivolumab      10/22/2015 Imaging    Screening mammogram showed mild abnormality      10/31/2015 Imaging    Diagnostic mammogram showed mild calcification at the right upper outer breast      11/27/2015 Imaging    Stable post treatment related changes of left lower lobectomy and radiation therapy redemonstrated, as above. No definite findings to suggest local recurrence of disease or metastatic disease on today's examination      01/24/2016 Imaging    MR brain showed continued stable appearance of two small treated brain metastases. No new or progressive metastatic disease to the brain.      04/15/2016 Imaging    Ct scan showed findings highly suspicious for recurrent tumor in the mediastinum surrounding the right mainstem bronchus and in the region of the azygoesophageal recess. Diffuse pulmonary metastatic disease with new and progressive nodules since the prior examination.      04/24/2016 -  Chemotherapy    The patient had chemotherapy with gemzar and Carboplatin      04/30/2016 Imaging    Decreased contrast enhancement of the two treated metastases in the right frontal and left occipital lobes. No new lesions.      06/01/2016 Imaging    CXR showed numerous metastatic nodules have  progressed compared to the most recent comparison chest x-ray of 07/15/2015. Majority of these were noted on the 04/15/2016 chest CT. No segmental infiltrate or pulmonary edema.      06/19/2016 Adverse Reaction    Treatment is placed on hold today due to neutropenia and the patient not feeling well      06/29/2016 Imaging    Interval decrease in size of metastatic pulmonary nodules. 2. Persistent but slightly improved right hilar/mediastinal disease. No progressive findings. 3. No findings for upper abdominal metastatic disease      10/01/2016 Imaging    Mild increase in size of 2.5 cm central right  lower lobe pulmonary nodule, consistent with metastatic disease. Numerous other bilateral sub-cm pulmonary nodules are stable.  Stable mild subcarinal and right cardiophrenic angle lymphadenopathy. Stable emphysema and right lung radiation changes. Stable hepatic steatosis. Aortic and coronary artery atherosclerosis.      11/06/2016 Imaging    MR brain: Stable treated RIGHT insula and LEFT occipital metastases.      12/30/2016 Imaging    1. Bilateral pulmonary nodules are minimally progressive from 10/01/2016. Dominant nodule in the superior segment right lower lobe is stable. 2. Aortic atherosclerosis (ICD10-170.0). Coronary artery calcification.       Brain metastases treated with radiosurgery   02/26/2012 Initial Diagnosis    Brain metastases treated with radiosurgery       INTERVAL HISTORY: Please see below for problem oriented charting. She returns to review test results She had been complaining of significant chest wall pain She took Dilaudid but it was not helpful.  The dose was 8 mg. She does not sleep well She have significant anxiety She denies depression She continues to have chronic cough It is nonproductive.  She denies hemoptysis. Peripheral neuropathy is about the same She continues to smoke and is not willing to quit smoking.  REVIEW OF SYSTEMS:   Constitutional:  Denies fevers, chills or abnormal weight loss Eyes: Denies blurriness of vision Ears, nose, mouth, throat, and face: Denies mucositis or sore throat Cardiovascular: Denies palpitation, chest discomfort or lower extremity swelling Gastrointestinal:  Denies nausea, heartburn or change in bowel habits Skin: Denies abnormal skin rashes Lymphatics: Denies new lymphadenopathy or easy bruising Neurological:Denies numbness, tingling or new weaknesses  All other systems were reviewed with the patient and are negative.  I have reviewed the past medical history, past surgical history, social history and family history with the patient and they are unchanged from previous note.  ALLERGIES:  is allergic to adhesive [tape] and lisinopril.  MEDICATIONS:  Current Outpatient Prescriptions  Medication Sig Dispense Refill  . BYSTOLIC 10 MG tablet Take 10 mg by mouth daily.     . diclofenac sodium (VOLTAREN) 1 % GEL Apply 2 g topically 4 (four) times daily as needed. (Patient taking differently: Apply 2 g topically 4 (four) times daily as needed (pain). ) 100 g 1  . HYDROmorphone (DILAUDID) 8 MG tablet Take 1 tablet (8 mg total) by mouth every 6 (six) hours as needed for severe pain. 60 tablet 0  . ipratropium (ATROVENT HFA) 17 MCG/ACT inhaler Inhale 2 puffs into the lungs every 4 (four) hours as needed for wheezing (Cough). 3 Inhaler 3  . lidocaine-prilocaine (EMLA) cream APPLY TOPICALLY AS NEEDED TO PORTA CATH SITE 1 HOUR PRIOR TO NEEDLE STICK. 30 g 0  . loratadine (CLARITIN) 10 MG tablet Take 10 mg by mouth daily.    Marland Kitchen LORazepam (ATIVAN) 0.5 MG tablet Take 1 tablet (0.5 mg total) by mouth every 8 (eight) hours as needed for anxiety. 60 tablet 1  . mirtazapine (REMERON) 15 MG tablet Take 1 tablet (15 mg total) by mouth at bedtime. 30 tablet 11  . nitroGLYCERIN (NITROSTAT) 0.4 MG SL tablet Place 1 tablet (0.4 mg total) under the tongue every 5 (five) minutes as needed. For chest pain 25 tablet 5  .  omeprazole (PRILOSEC) 40 MG capsule Take 40 mg by mouth daily.     . ondansetron (ZOFRAN ODT) 4 MG disintegrating tablet Take 1 tablet (4 mg total) by mouth every 8 (eight) hours as needed for nausea or vomiting. 20 tablet 0  . pravastatin (  PRAVACHOL) 40 MG tablet Take 40 mg by mouth daily.     Marland Kitchen umeclidinium-vilanterol (ANORO ELLIPTA) 62.5-25 MCG/INH AEPB Inhale 1 puff into the lungs daily. 3 each 3  . VENTOLIN HFA 108 (90 Base) MCG/ACT inhaler Inhale 2 puffs into the lungs every 4 (four) hours as needed for wheezing or shortness of breath. 3 Inhaler 3   No current facility-administered medications for this visit.    Facility-Administered Medications Ordered in Other Visits  Medication Dose Route Frequency Provider Last Rate Last Dose  . sodium chloride 0.9 % injection 10 mL  10 mL Intravenous PRN Alvy Bimler, Kennis Buell, MD   10 mL at 09/18/15 1345  . sodium chloride 0.9 % injection 10 mL  10 mL Intravenous PRN Alvy Bimler, Ramon Zanders, MD   10 mL at 04/24/16 1136    PHYSICAL EXAMINATION: ECOG PERFORMANCE STATUS: 2 - Symptomatic, <50% confined to bed  Vitals:   01/01/17 1005  BP: (!) 146/58  Pulse: 99  Resp: 18  Temp: 97.7 F (36.5 C)  SpO2: 99%   Filed Weights   01/01/17 1005  Weight: 159 lb 11.2 oz (72.4 kg)    GENERAL:alert, no distress and comfortable SKIN: skin color, texture, turgor are normal, no rashes or significant lesions EYES: normal, Conjunctiva are pink and non-injected, sclera clear OROPHARYNX:no exudate, no erythema and lips, buccal mucosa, and tongue normal  NECK: supple, thyroid normal size, non-tender, without nodularity LYMPH:  no palpable lymphadenopathy in the cervical, axillary or inguinal LUNGS: clear to auscultation and percussion with normal breathing effort HEART: regular rate & rhythm and no murmurs and no lower extremity edema ABDOMEN:abdomen soft, non-tender and normal bowel sounds Musculoskeletal:no cyanosis of digits and no clubbing  NEURO: alert & oriented x 3 with  fluent speech, no focal motor/sensory deficits  LABORATORY DATA:  I have reviewed the data as listed    Component Value Date/Time   NA 140 01/01/2017 0941   K 3.2 (L) 01/01/2017 0941   CL 106 11/06/2016 1437   CL 102 10/24/2012 1001   CO2 22 01/01/2017 0941   GLUCOSE 121 01/01/2017 0941   GLUCOSE 111 (H) 10/24/2012 1001   BUN 9.5 01/01/2017 0941   CREATININE 1.0 01/01/2017 0941   CALCIUM 8.7 01/01/2017 0941   PROT 6.3 (L) 01/01/2017 0941   ALBUMIN 2.8 (L) 01/01/2017 0941   AST 10 01/01/2017 0941   ALT <6 01/01/2017 0941   ALKPHOS 148 01/01/2017 0941   BILITOT 0.31 01/01/2017 0941   GFRNONAA 55 (L) 11/06/2016 1437   GFRAA >60 11/06/2016 1437    No results found for: SPEP, UPEP  Lab Results  Component Value Date   WBC 5.7 01/01/2017   NEUTROABS 4.4 01/01/2017   HGB 9.9 (L) 01/01/2017   HCT 31.8 (L) 01/01/2017   MCV 85.2 01/01/2017   PLT 149 01/01/2017      Chemistry      Component Value Date/Time   NA 140 01/01/2017 0941   K 3.2 (L) 01/01/2017 0941   CL 106 11/06/2016 1437   CL 102 10/24/2012 1001   CO2 22 01/01/2017 0941   BUN 9.5 01/01/2017 0941   CREATININE 1.0 01/01/2017 0941      Component Value Date/Time   CALCIUM 8.7 01/01/2017 0941   ALKPHOS 148 01/01/2017 0941   AST 10 01/01/2017 0941   ALT <6 01/01/2017 0941   BILITOT 0.31 01/01/2017 0941       RADIOGRAPHIC STUDIES: I have personally reviewed the radiological images as listed and agreed with the findings  in the report. Dg Lumbar Spine 2-3 Views  Result Date: 12/10/2016 CLINICAL DATA:  Recent fall with low back pain, initial encounter EXAM: LUMBAR SPINE - 2-3 VIEW COMPARISON:  11/26/2015 FINDINGS: Five lumbar-type vertebral bodies are noted. Vertebral body height is well maintained. Disc space narrowing is noted at L5-S1 stable from the prior exam. No compression deformities are seen. No acute bony abnormality is noted. Aortic calcifications are seen. IMPRESSION: Mild degenerative change without  acute abnormality. Electronically Signed   By: Inez Catalina M.D.   On: 12/10/2016 17:58   Ct Chest W Contrast  Result Date: 12/30/2016 CLINICAL DATA:  Metastatic lung cancer. Maintenance chemotherapy ongoing. EXAM: CT CHEST WITH CONTRAST TECHNIQUE: Multidetector CT imaging of the chest was performed during intravenous contrast administration. CONTRAST:  75 cc Isovue-300. COMPARISON:  10/01/2016. FINDINGS: Cardiovascular: Right IJ Port-A-Cath terminates in the right atrium. Atherosclerotic calcification of the arterial vasculature, including coronary arteries. Heart size normal. No pericardial effusion. Mediastinum/Nodes: Subcarinal lymph node measures 1.7 cm, stable. There is ill-defined soft tissue encasing the right hilum, as before. No left hilar or axillary adenopathy. Pre pericardiac and juxta diaphragmatic lymph nodes measure up to 8 mm, as before. Esophagus is grossly unremarkable. Lungs/Pleura: Post treatment scarring and volume loss with bronchiectasis in the posteromedial upper and mid right hemithorax. A 2.8 cm nodule in the medial aspect of the superior segment right lower lobe is stable. There is additional scattered nodularity in the lungs bilaterally, in the peribronchovascular distribution. Some nodules appear larger. Index nodule along the medial aspect of the minor fissure measures 9 mm (series 7, image 54), previously 6 mm. Nodularity along the right hilum (series 7, image 60) is more pronounced, now measuring 6 mm. Finally, medial segment right middle lobe nodule measures 9 mm (image 70), previously 6 mm. Left upper lobectomy. No pleural fluid. Adherent debris in the lower trachea. Airway is otherwise unremarkable. Upper Abdomen: Subcentimeter low-attenuation lesions in the liver are too small to characterize but stable. Visualized portions of the liver and right adrenal gland are unremarkable. Left adrenal thickening, as before. Visualized portions of the kidneys, spleen, pancreas, stomach  and bowel are grossly unremarkable. No upper abdominal adenopathy. Musculoskeletal: No worrisome lytic or sclerotic lesions. IMPRESSION: 1. Bilateral pulmonary nodules are minimally progressive from 10/01/2016. Dominant nodule in the superior segment right lower lobe is stable. 2. Aortic atherosclerosis (ICD10-170.0). Coronary artery calcification. Electronically Signed   By: Lorin Picket M.D.   On: 12/30/2016 12:29   Dg Hip Unilat With Pelvis 2-3 Views Right  Result Date: 12/10/2016 CLINICAL DATA:  Right-sided hip pain, no known injury, initial encounter, history of lung carcinoma EXAM: DG HIP (WITH OR WITHOUT PELVIS) 3V RIGHT COMPARISON:  None. FINDINGS: Mild degenerative changes of the hip joint are noted. No acute fracture or dislocation is seen. Pelvic ring is intact. No soft tissue abnormality is seen. IMPRESSION: No acute abnormality noted. Electronically Signed   By: Inez Catalina M.D.   On: 12/10/2016 17:56    ASSESSMENT & PLAN:  Cancer of upper lobe of left lung, Adenocarcinoma Unfortunately, CT scan showed disease progression She is symptomatic We discussed treatment options She understood treatment is purely palliative in nature Ultimately, we decided to switch her to single agent Abraxane I will try weekly treatment, 3 weeks on and 1 week off and plan to restage after 3 months I will cancel her treatment today and start her treatment next week  Goals of care, counseling/discussion The patient is aware she has stage IV  disease and treatment is strictly palliative. We discussed importance of Advanced Directives and Living will. The patient has advanced directive. Her daughter is the dedicated medical power of attorney We discussed CODE STATUS; the patient desires to be DO NOT RESUSCITATE She shared with me that 2 of her sons have Huntington's disease and they are currently being cared for in group homes. Her desire is to continue chemotherapy to prolong life but if she becomes  debilitated, she will stop treatment She does not want long-term artificial feeding tube or hemodialysis if she developed multiple organ failure The patient desire further discussion with palliative care team I will place palliative care consult today  Cancer associated pain She has chest wall pain which I suspect is due to disease Previously, I did write her prescription for Dilaudid but she felt it was not helping I recommend increasing the dose to 12 mg every 3 hours as needed and I will reassess pain control in our next visit  TOBACCO ABUSE I spent some time counseling the patient the importance of tobacco cessation. She is currently not interested to quit now.  Anemia due to chemotherapy This is likely due to recent treatment. The patient denies recent history of bleeding such as epistaxis, hematuria or hematochezia. She is asymptomatic from the anemia. I will observe for now.    Generalized anxiety disorder She has generalized anxiety She does not sleep well I recommend Remeron at nighttime and I gave her prescription lorazepam to take as needed I did warn her about risk of sedation with lorazepam. As above, I will get palliative care service to provide symptom management at home.   Orders Placed This Encounter  Procedures  . CBC with Differential    Standing Status:   Standing    Number of Occurrences:   20    Standing Expiration Date:   01/02/2018  . Comprehensive metabolic panel    Standing Status:   Standing    Number of Occurrences:   20    Standing Expiration Date:   01/02/2018  . Amb Referral to Palliative Care    Referral Priority:   Routine    Referral Type:   Consultation    Number of Visits Requested:   1   All questions were answered. The patient knows to call the clinic with any problems, questions or concerns. No barriers to learning was detected. I spent 40 minutes counseling the patient face to face. The total time spent in the appointment was 55 minutes  and more than 50% was on counseling and review of test results     Heath Lark, MD 01/01/2017 11:52 AM

## 2017-01-01 NOTE — Assessment & Plan Note (Signed)
The patient is aware she has stage IV disease and treatment is strictly palliative. We discussed importance of Advanced Directives and Living will. The patient has advanced directive. Her daughter is the dedicated medical power of attorney We discussed CODE STATUS; the patient desires to be DO NOT RESUSCITATE She shared with me that 2 of her sons have Huntington's disease and they are currently being cared for in group homes. Her desire is to continue chemotherapy to prolong life but if she becomes debilitated, she will stop treatment She does not want long-term artificial feeding tube or hemodialysis if she developed multiple organ failure The patient desire further discussion with palliative care team I will place palliative care consult today

## 2017-01-01 NOTE — Patient Outreach (Addendum)
[facility]) Care Management  01/01/2017   Lauren [name] 1951/01/23 [id number]  RN Health Coach received outreach call from patient.  Per patient she had seen her oncologist and was told the there is a tumor that is larger on the scan. Patient stated they are changing her chemotherapy. Patient stated she asked about how long she may have and was told 4-6 months and maybe 2 more additional  months with the chemo. Patient understands that there is no guarantee that she has that long. Patient stated she asked about a palliative care referral. Per patient the doctor is making the referral .Patient stated the doctor gave her a prescription for her anxiety. Patient called about getting into group counseling and set that up for next week. Patient has signed a living will at the cancer center. Patient stated she is a DNR and does not want any CPR.  Patient was more relaxed and able to make decisions. Patient asked about letting her family know.RN discussed her about her support system and by making them aware, they would know what to expect. RN explained that is a decision that she has to ultimately make. Patient has agreed to follow up outreach calls.   Current Medications:  Current Outpatient Prescriptions  Medication Sig Dispense Refill  . BYSTOLIC 10 MG tablet Take 10 mg by mouth daily.     . diclofenac sodium (VOLTAREN) 1 % GEL Apply 2 g topically 4 (four) times daily as needed. (Patient taking differently: Apply 2 g topically 4 (four) times daily as needed (pain). ) 100 g 1  . HYDROmorphone (DILAUDID) 8 MG tablet Take 1 tablet (8 mg total) by mouth every 6 (six) hours as needed for severe pain. 60 tablet 0  . ipratropium (ATROVENT HFA) 17 MCG/ACT inhaler Inhale 2 puffs into the lungs every 4 (four) hours as needed for wheezing (Cough). 3 Inhaler 3  . lidocaine-prilocaine (EMLA) cream APPLY TOPICALLY AS NEEDED TO PORTA CATH SITE 1 HOUR PRIOR TO NEEDLE STICK. 30 g 0  . loratadine  (CLARITIN) 10 MG tablet Take 10 mg by mouth daily.    Marland Kitchen LORazepam (ATIVAN) 0.5 MG tablet Take 1 tablet (0.5 mg total) by mouth every 8 (eight) hours as needed for anxiety. 60 tablet 1  . mirtazapine (REMERON) 15 MG tablet Take 1 tablet (15 mg total) by mouth at bedtime. 30 tablet 11  . nitroGLYCERIN (NITROSTAT) 0.4 MG SL tablet Place 1 tablet (0.4 mg total) under the tongue every 5 (five) minutes as needed. For chest pain 25 tablet 5  . omeprazole (PRILOSEC) 40 MG capsule Take 40 mg by mouth daily.     . ondansetron (ZOFRAN ODT) 4 MG disintegrating tablet Take 1 tablet (4 mg total) by mouth every 8 (eight) hours as needed for nausea or vomiting. 20 tablet 0  . pravastatin (PRAVACHOL) 40 MG tablet Take 40 mg by mouth daily.     Marland Kitchen umeclidinium-vilanterol (ANORO ELLIPTA) 62.5-25 MCG/INH AEPB Inhale 1 puff into the lungs daily. 3 each 3  . VENTOLIN HFA 108 (90 Base) MCG/ACT inhaler Inhale 2 puffs into the lungs every 4 (four) hours as needed for wheezing or shortness of breath. 3 Inhaler 3   No current facility-administered medications for this visit.    Facility-Administered Medications Ordered in Other Visits  Medication Dose Route Frequency Provider Last Rate Last Dose  . sodium chloride 0.9 % injection 10 mL  10 mL Intravenous PRN Alvy Bimler, Ni, MD   10 mL at 09/18/15 1345  .  sodium chloride 0.9 % injection 10 mL  10 mL Intravenous PRN Heath Lark, MD   10 mL at 04/24/16 1136    Functional Status:  In your present state of health, do you have any difficulty performing the following activities: 01/01/2017 12/31/2016  Hearing? N N  Vision? Y Y  Difficulty concentrating or making decisions? [name]  Walking or climbing stairs? Y Y  Dressing or bathing? N N  Doing errands, shopping? Y Y  Conservation officer, nature and eating ? - N  Using the Toilet? - N  In the past six months, have you accidently leaked urine? - Y  Do you have problems with loss of bowel control? - N  Managing your Medications? - N  Managing  your Finances? - N  Housekeeping or managing your Housekeeping? - Y  Some recent data might be hidden    Fall/Depression Screening: Fall Risk  01/01/2017 12/31/2016 12/10/2016  Falls in the past year? Yes Yes Yes  Number falls in past yr: 1 1 1   Comment - - -  Injury with Fall? No No No  Comment - - -  Risk Factor Category  High Fall Risk High Fall Risk High Fall Risk  Risk for fall due to : History of fall(s);Impaired balance/gait;Impaired mobility Impaired mobility;Impaired balance/gait;History of fall(s) -  Risk for fall due to: Comment - - -  Follow up Falls evaluation completed;Falls prevention discussed;Education provided Falls evaluation completed;Education provided;Falls prevention discussed -   PHQ 2/9 Scores 01/01/2017 12/31/2016 12/10/2016 12/02/2016 11/27/2016 11/11/2016 10/15/2016  PHQ - 2 Score 1 1 0 0 0 0 0  PHQ- 9 Score - - - - - - -  Exception Documentation - - - - - - -   THN CM Care Plan Problem One     Most Recent Value  Care Plan Problem One  Knowledge deficit in self management of COPD  Role Documenting the Problem One  Health [place] for Problem One  Active  THN Long Term Goal   Patient will not have any readmissions for COPD within the next 90 days  THN Long Term Goal Start Date  01/01/17  Interventions for Problem One Long Term Goal  RN reminded patient to keep appointments with PCP and oncology and pulmonary specialist. RN reminded patient to take medications as per order.  THN CM Short Term Goal #1   Patient will discuss with physician regarding end of life decisions   THN CM Short Term Goal #1 Start Date  01/01/17  Interventions for Short Term Goal #1  RN discussed with patient about palliative care versus Hospice. RN discussed with patient about making end of life decisions. RN will follow up with further discussion  THN CM Short Term Goal #2   Patient will discuss with physician about increased anxiety within the next 30 days  THN CM Short Term Goal #2  Start Date  12/31/16  Interventions for Short Term Goal #2  RN discussed with patient about the anxiety she is experiencing. RN made patient aware that she needs to make her physician aware with her visit on [phone]. RN wll follow up with patient for discussion       Assessment:  Patient discussed with doctor about end of life decisions.  Patient has set up group counseling Patient asked for palliative care referral Patient discussed pain issues Plan:  Patient will notify nurse when palliative care has notified her RN will provide support RN will follow up within the month  of September  Kye Silverstein [place] Care Management [phone]

## 2017-01-01 NOTE — Patient Outreach (Signed)
[facility]) Care Management [id number]  Lauren [name] 08-06-1950 [id number] RN Health Coach received return telephone call from patient.  [employer] compliance verified. Patient was very anxious about her illness. Patient stated she knows that her symptoms are getting worse. Per patient she is having increase coughing. She is having increase pain in her back between the hip and lower back. Patient stated she is having episodes of wanting to cry and panic like. Patient stated she is scared. She knows she doesn't have much longer. Patient stated she had looked up a little on palliative care. Patient wanted to know what it was and would it be good for her. RN explained to patient about palliative care services and what they had to offer.  RN discussed with patient about the feelings she is having. RN explained to patient about talking with her Dr about this on [phone] at her appointment.. RN explained that her doctor will need to make a referral for palliative care. RN explained that the doctor would order her something for anxiety. RN expressed to patient that she might would like to go into group counseling. Patient has agreed to further outreach calls.  Current Medications:  Current Outpatient Prescriptions  Medication Sig Dispense Refill  . BYSTOLIC 10 MG tablet Take 10 mg by mouth daily.     . diclofenac sodium (VOLTAREN) 1 % GEL Apply 2 g topically 4 (four) times daily as needed. (Patient taking differently: Apply 2 g topically 4 (four) times daily as needed (pain). ) 100 g 1  . HYDROmorphone (DILAUDID) 8 MG tablet Take 1 tablet (8 mg total) by mouth every 6 (six) hours as needed for severe pain. 60 tablet 0  . ipratropium (ATROVENT HFA) 17 MCG/ACT inhaler Inhale 2 puffs into the lungs every 4 (four) hours as needed for wheezing (Cough). 3 Inhaler 3  . lidocaine-prilocaine (EMLA) cream APPLY TOPICALLY AS NEEDED TO PORTA CATH SITE 1 HOUR PRIOR TO NEEDLE STICK. 30 g 0  . loratadine  (CLARITIN) 10 MG tablet Take 10 mg by mouth daily.    Marland Kitchen LORazepam (ATIVAN) 0.5 MG tablet Take 1 tablet (0.5 mg total) by mouth every 8 (eight) hours as needed for anxiety. 60 tablet 1  . mirtazapine (REMERON) 15 MG tablet Take 1 tablet (15 mg total) by mouth at bedtime. 30 tablet 11  . nitroGLYCERIN (NITROSTAT) 0.4 MG SL tablet Place 1 tablet (0.4 mg total) under the tongue every 5 (five) minutes as needed. For chest pain 25 tablet 5  . omeprazole (PRILOSEC) 40 MG capsule Take 40 mg by mouth daily.     . ondansetron (ZOFRAN ODT) 4 MG disintegrating tablet Take 1 tablet (4 mg total) by mouth every 8 (eight) hours as needed for nausea or vomiting. 20 tablet 0  . pravastatin (PRAVACHOL) 40 MG tablet Take 40 mg by mouth daily.     Marland Kitchen umeclidinium-vilanterol (ANORO ELLIPTA) 62.5-25 MCG/INH AEPB Inhale 1 puff into the lungs daily. 3 each 3  . VENTOLIN HFA 108 (90 Base) MCG/ACT inhaler Inhale 2 puffs into the lungs every 4 (four) hours as needed for wheezing or shortness of breath. 3 Inhaler 3   No current facility-administered medications for this visit.    Facility-Administered Medications Ordered in Other Visits  Medication Dose Route Frequency Provider Last Rate Last Dose  . sodium chloride 0.9 % injection 10 mL  10 mL Intravenous PRN Alvy Bimler, Ni, MD   10 mL at 09/18/15 1345  . sodium chloride 0.9 % injection 10  mL  10 mL Intravenous PRN Heath Lark, MD   10 mL at 04/24/16 1136    Functional Status:  In your present state of health, do you have any difficulty performing the following activities: 01/01/2017 12/31/2016  Hearing? N N  Vision? Y Y  Difficulty concentrating or making decisions? [name]  Walking or climbing stairs? Y Y  Dressing or bathing? N N  Doing errands, shopping? [name]  Preparing Food and eating ? N N  Using the Toilet? N N  In the past six months, have you accidently leaked urine? Y Y  Do you have problems with loss of bowel control? N N  Managing your Medications? N N  Managing  your Finances? N N  Housekeeping or managing your Housekeeping? Y Y  Some recent data might be hidden    Fall/Depression Screening: Fall Risk  01/01/2017 12/31/2016 12/10/2016  Falls in the past year? Yes Yes Yes  Number falls in past yr: 1 1 1   Comment - - -  Injury with Fall? No No No  Comment - - -  Risk Factor Category  High Fall Risk High Fall Risk High Fall Risk  Risk for fall due to : History of fall(s);Impaired balance/gait;Impaired mobility Impaired mobility;Impaired balance/gait;History of fall(s) -  Risk for fall due to: Comment - - -  Follow up Falls evaluation completed;Falls prevention discussed;Education provided Falls evaluation completed;Education provided;Falls prevention discussed -   PHQ 2/9 Scores 01/01/2017 12/31/2016 12/10/2016 12/02/2016 11/27/2016 11/11/2016 10/15/2016  PHQ - 2 Score 1 1 0 0 0 0 0  PHQ- 9 Score - - - - - - -  Exception Documentation - - - - - - -   THN CM Care Plan Problem One     Most Recent Value  Care Plan Problem One  Knowledge deficit in self management of COPD  Role Documenting the Problem One  Health [place] for Problem One  Active  THN Long Term Goal   Patient will not have any readmissions for COPD within the next 90 days  THN Long Term Goal Start Date  12/31/16  Interventions for Problem One Long Term Goal  RN reminded patient to keep appointments with PCP and oncology and pulmonary specialist. RN reminded patient to take medications as per order.  THN CM Short Term Goal #1   Patient will discuss with physician regarding end of life decisions   THN CM Short Term Goal #1 Start Date  12/31/16  Interventions for Short Term Goal #1  RN discussed with patient about palliative care versus Hospice. RN discussed with patient about making end of life decisions. RN will follow up with further discussion  THN CM Short Term Goal #2   Patient will discuss with physician about increased anxiety within the next 30 days  THN CM Short Term Goal #2  Start Date  12/31/16  Interventions for Short Term Goal #2  RN discussed with patient about the anxiety she is experiencing. RN made patient aware that she needs to make her physician aware with her visit on [phone]. RN wll follow up with patient for discussion       Assessment:  Patient is very anxious Patient is in pain Patient will continue to benefit from support for her COPD  Plan:  Patient will discuss with physician about her pain Patient will discuss with physician about referring her to palliative care Patient will look into going back to group therapy RN will follow up with patient  in the month of September and as patient needs support  [place] Management [phone]

## 2017-01-01 NOTE — Progress Notes (Signed)
DISCONTINUE OFF PATHWAY REGIMEN - Non-Small Cell Lung  Other Clinical Trial: abraxane  REASON: Disease Progression PRIOR TREATMENT: Other Trial - abraxane TREATMENT RESPONSE: Progressive Disease (PD)  START OFF PATHWAY REGIMEN - Non-Small Cell Lung   [id number]:Nab-Paclitaxel (Abraxane(R)) 100 mg/m2 D1,8,15 q28 days:   A cycle is every 28 days (3 weeks on and 1 week off):     Nab-paclitaxel (protein bound)   **Always confirm dose/schedule in your pharmacy ordering system**    Patient Characteristics: Stage IV Metastatic, Non Squamous, Fourth Line and Beyond - Chemotherapy/Immunotherapy, PS = 0, 1, Prior PD-1/PD-L1 Inhibitor or No Prior PD-1/PD-L1 Inhibitor and Not a Candidate for Immunotherapy AJCC T Category: T4 Current Disease Status: Distant Metastases AJCC N Category: N1 AJCC M Category: M1c AJCC 8 Stage Grouping: IVB Histology: Non Squamous Cell ROS1 Rearrangement Status: Negative T790M Mutation Status: Not Applicable - EGFR Mutation Negative/Unknown Other Mutations/Biomarkers: No Other Actionable Mutations PD-L1 Expression Status: PD-L1 Negative Chemotherapy/Immunotherapy LOT: Fourth Line and Beyond Chemotherapy/Immunotherapy Molecular Targeted Therapy: Not Appropriate ALK Translocation Status: Negative Would you be surprised if this patient died  in the next year? I would be surprised if this patient died in the next year EGFR Mutation Status: Negative/Wild Type BRAF V600E Mutation Status: Negative Performance Status: PS = 0, 1 Immunotherapy Candidate Status: Not a Candidate for Immunotherapy Prior Immunotherapy Status: Prior PD-1/PD-L1 Inhibitor Intent of Therapy: Non-Curative / Palliative Intent, Discussed with Patient

## 2017-01-01 NOTE — Assessment & Plan Note (Signed)
This is likely due to recent treatment. The patient denies recent history of bleeding such as epistaxis, hematuria or hematochezia. She is asymptomatic from the anemia. I will observe for now.   

## 2017-01-01 NOTE — Assessment & Plan Note (Signed)
She has generalized anxiety She does not sleep well I recommend Remeron at nighttime and I gave her prescription lorazepam to take as needed I did warn her about risk of sedation with lorazepam. As above, I will get palliative care service to provide symptom management at home.

## 2017-01-01 NOTE — Telephone Encounter (Signed)
appts made and avs printed for patient 

## 2017-01-01 NOTE — Telephone Encounter (Signed)
Called with referral for palliative care to [facility] and palliative care.

## 2017-01-01 NOTE — Assessment & Plan Note (Addendum)
Unfortunately, CT scan showed disease progression She is symptomatic We discussed treatment options She understood treatment is purely palliative in nature Ultimately, we decided to switch her to single agent Abraxane I will try weekly treatment, 3 weeks on and 1 week off and plan to restage after 3 months I will cancel her treatment today and start her treatment next week

## 2017-01-01 NOTE — Assessment & Plan Note (Signed)
I spent some time counseling the patient the importance of tobacco cessation. She is currently not interested to quit now.

## 2017-01-01 NOTE — Assessment & Plan Note (Signed)
She has chest wall pain which I suspect is due to disease Previously, I did write her prescription for Dilaudid but she felt it was not helping I recommend increasing the dose to 12 mg every 3 hours as needed and I will reassess pain control in our next visit

## 2017-01-01 NOTE — Progress Notes (Signed)
DISCONTINUE ON PATHWAY REGIMEN - Non-Small Cell Lung  No Medical Intervention - Off Treatment.  REASON: Disease Progression PRIOR TREATMENT: Off Treatment  START ON PATHWAY REGIMEN - Non-Small Cell Lung  Other Clinical Trial: abraxane  Patient Characteristics: Stage IV Metastatic, Non Squamous, Fourth Line and Beyond - Chemotherapy/Immunotherapy, PS >= 2 AJCC T Category: T4 Current Disease Status: Distant Metastases AJCC N Category: N1 AJCC M Category: M1c AJCC 8 Stage Grouping: IVB Histology: Non Squamous Cell ROS1 Rearrangement Status: Negative T790M Mutation Status: Not Applicable - EGFR Mutation Negative/Unknown Other Mutations/Biomarkers: No Other Actionable Mutations PD-L1 Expression Status: PD-L1 Negative Chemotherapy/Immunotherapy LOT: Fourth Line and Beyond Chemotherapy/Immunotherapy Molecular Targeted Therapy: Not Appropriate ALK Translocation Status: Negative Would you be surprised if this patient died  in the next year? I would be surprised if this patient died in the next year EGFR Mutation Status: Negative/Wild Type BRAF V600E Mutation Status: Negative Performance Status: PS = 2 Intent of Therapy: Non-Curative / Palliative Intent, Discussed with Patient

## 2017-01-04 MED FILL — LORazepam 0.5 MG TABS: 0.5 | 20 days supply | Qty: 60 | Fill #0

## 2017-01-04 MED FILL — MIRTAZAPINE 15 MG TAB: 15 | 30 days supply | Qty: 30 | Fill #0

## 2017-01-07 ENCOUNTER — Encounter: Payer: Self-pay | Admitting: Family Medicine

## 2017-01-07 ENCOUNTER — Ambulatory Visit (INDEPENDENT_AMBULATORY_CARE_PROVIDER_SITE_OTHER): Payer: Medicare HMO | Admitting: Family Medicine

## 2017-01-07 DIAGNOSIS — F330 Major depressive disorder, recurrent, mild: Secondary | ICD-10-CM

## 2017-01-07 DIAGNOSIS — G8929 Other chronic pain: Secondary | ICD-10-CM

## 2017-01-07 DIAGNOSIS — Z23 Encounter for immunization: Secondary | ICD-10-CM

## 2017-01-07 DIAGNOSIS — M545 Low back pain, unspecified: Secondary | ICD-10-CM

## 2017-01-07 DIAGNOSIS — R053 Chronic cough: Secondary | ICD-10-CM

## 2017-01-07 DIAGNOSIS — R05 Cough: Secondary | ICD-10-CM

## 2017-01-07 DIAGNOSIS — R5383 Other fatigue: Secondary | ICD-10-CM | POA: Diagnosis not present

## 2017-01-07 NOTE — Patient Instructions (Signed)
I am so very glad to see you.   I like the idea of taking the pill for anxiousness or falling asleep separate from your pain pill, if you can.   If your breathing becomes harder, or your cough worsens, or you start having a fever, or the amount of sputum coughed up increases or changes color, call Dr McDiarmid's office to let me know.

## 2017-01-08 ENCOUNTER — Other Ambulatory Visit (HOSPITAL_BASED_OUTPATIENT_CLINIC_OR_DEPARTMENT_OTHER): Payer: Medicare HMO

## 2017-01-08 ENCOUNTER — Ambulatory Visit: Payer: Medicare HMO

## 2017-01-08 ENCOUNTER — Ambulatory Visit (HOSPITAL_BASED_OUTPATIENT_CLINIC_OR_DEPARTMENT_OTHER): Payer: Medicare HMO

## 2017-01-08 VITALS — BP 135/77 | HR 99 | Temp 99.0°F | Resp 18

## 2017-01-08 DIAGNOSIS — C3412 Malignant neoplasm of upper lobe, left bronchus or lung: Secondary | ICD-10-CM

## 2017-01-08 DIAGNOSIS — Z5111 Encounter for antineoplastic chemotherapy: Secondary | ICD-10-CM

## 2017-01-08 DIAGNOSIS — C7931 Secondary malignant neoplasm of brain: Secondary | ICD-10-CM

## 2017-01-08 DIAGNOSIS — C7949 Secondary malignant neoplasm of other parts of nervous system: Secondary | ICD-10-CM

## 2017-01-08 DIAGNOSIS — Z7189 Other specified counseling: Secondary | ICD-10-CM

## 2017-01-08 DIAGNOSIS — M549 Dorsalgia, unspecified: Secondary | ICD-10-CM

## 2017-01-08 DIAGNOSIS — R05 Cough: Secondary | ICD-10-CM | POA: Insufficient documentation

## 2017-01-08 DIAGNOSIS — G8929 Other chronic pain: Secondary | ICD-10-CM | POA: Insufficient documentation

## 2017-01-08 DIAGNOSIS — R053 Chronic cough: Secondary | ICD-10-CM | POA: Insufficient documentation

## 2017-01-08 LAB — CBC WITH DIFFERENTIAL/PLATELET
BASO%: 0.7 % (ref 0.0–2.0)
Basophils Absolute: 0.1 10*3/uL (ref 0.0–0.1)
EOS ABS: 0.1 10*3/uL (ref 0.0–0.5)
EOS%: 0.9 % (ref 0.0–7.0)
HEMATOCRIT: 33.3 % — AB (ref 34.8–46.6)
HEMOGLOBIN: 10.6 g/dL — AB (ref 11.6–15.9)
LYMPH#: 0.9 10*3/uL (ref 0.9–3.3)
LYMPH%: 10.5 % — ABNORMAL LOW (ref 14.0–49.7)
MCH: 26.5 pg (ref 25.1–34.0)
MCHC: 31.7 g/dL (ref 31.5–36.0)
MCV: 83.6 fL (ref 79.5–101.0)
MONO#: 1.2 10*3/uL — AB (ref 0.1–0.9)
MONO%: 14 % (ref 0.0–14.0)
NEUT%: 73.9 % (ref 38.4–76.8)
NEUTROS ABS: 6.2 10*3/uL (ref 1.5–6.5)
Platelets: 246 10*3/uL (ref 145–400)
RBC: 3.99 10*6/uL (ref 3.70–5.45)
RDW: 23.1 % — AB (ref 11.2–14.5)
WBC: 8.3 10*3/uL (ref 3.9–10.3)

## 2017-01-08 LAB — COMPREHENSIVE METABOLIC PANEL
ALBUMIN: 2.9 g/dL — AB (ref 3.5–5.0)
ALK PHOS: 137 U/L (ref 40–150)
ALT: <6 U/L (ref 0–55)
AST: 10 U/L (ref 5–34)
Anion Gap: 6 mEq/L (ref 3–11)
BILIRUBIN TOTAL: 0.31 mg/dL (ref 0.20–1.20)
BUN: 10.3 mg/dL (ref 7.0–26.0)
CALCIUM: 9.3 mg/dL (ref 8.4–10.4)
CO2: 29 mEq/L (ref 22–29)
Chloride: 106 mEq/L (ref 98–109)
Creatinine: 1 mg/dL (ref 0.6–1.1)
EGFR: 65 mL/min/{1.73_m2} — AB (ref >90)
GLUCOSE: 86 mg/dL (ref 70–140)
Potassium: 3.5 mEq/L (ref 3.5–5.1)
SODIUM: 141 meq/L (ref 136–145)
TOTAL PROTEIN: 6.6 g/dL (ref 6.4–8.3)

## 2017-01-08 MED ORDER — PACLITAXEL PROTEIN-BOUND CHEMO INJECTION 100 MG
100.0000 mg/m2 | Freq: Once | INTRAVENOUS | Status: AC
  Administered 2017-01-08: 175 mg via INTRAVENOUS
  Filled 2017-01-08: qty 35

## 2017-01-08 MED ORDER — SODIUM CHLORIDE 0.9 % IV SOLN
Freq: Once | INTRAVENOUS | Status: AC
  Administered 2017-01-08: 12:00:00 via INTRAVENOUS

## 2017-01-08 MED ORDER — HEPARIN SOD (PORK) LOCK FLUSH 100 UNIT/ML IV SOLN
500.0000 [IU] | Freq: Once | INTRAVENOUS | Status: AC | PRN
  Administered 2017-01-08: 500 [IU]
  Filled 2017-01-08: qty 5

## 2017-01-08 MED ORDER — SODIUM CHLORIDE 0.9 % IJ SOLN
10.0000 mL | Freq: Once | INTRAMUSCULAR | Status: AC
  Administered 2017-01-08: 10 mL via INTRAVENOUS
  Filled 2017-01-08: qty 10

## 2017-01-08 MED ORDER — PROCHLORPERAZINE MALEATE 10 MG PO TABS
ORAL_TABLET | ORAL | Status: AC
Start: 2017-01-08 — End: 2017-01-08
  Filled 2017-01-08: qty 1

## 2017-01-08 MED ORDER — SODIUM CHLORIDE 0.9% FLUSH
10.0000 mL | INTRAVENOUS | Status: DC | PRN
Start: 2017-01-08 — End: 2017-01-08
  Administered 2017-01-08: 10 mL
  Filled 2017-01-08: qty 10

## 2017-01-08 MED ORDER — PROCHLORPERAZINE MALEATE 10 MG PO TABS
10.0000 mg | ORAL_TABLET | Freq: Once | ORAL | Status: AC
  Administered 2017-01-08: 10 mg via ORAL

## 2017-01-08 NOTE — Patient Instructions (Signed)

## 2017-01-08 NOTE — Assessment & Plan Note (Addendum)
Established problem No Red Flags symptoms Adequate analgesia with Dilaudid oral Continue current therapy regiment. If symptoms progress or Red flag symptoms develop,. Then Lumbar MRI with contrast to llok for malignancy complications.

## 2017-01-08 NOTE — Assessment & Plan Note (Addendum)
New diagnosis At-risk of serious causes of cough, including malignant involvement of airwar. Immunocompromised state, COPD and continued smoking.   92% SaO2 with ambulation on Room Air today On COPD medications: Anoro Ellipta which she is taking and  Ventolin MDI prn which she takes about once a day No impairment of function from cough currently.  Red Flags given to patient to contact our office.

## 2017-01-08 NOTE — Progress Notes (Signed)
   Subjective:    Patient ID: Lauren [name], female    DOB: 1951/02/26, 66 y.o.   MRN: [id number] Lauren [name] is alone Sources of clinical information for visit is/are patient and past medical records. Nursing assessment for this office visit was reviewed with the patient for accuracy and revision.   HPI  BACK PAIN  Location: right lower back with occasional left lateral abdomen  Quality: achje Onset: several months ago  Worse with: rotation torso  Better with: Diluadid tablets  Radiation: ? To left lateral abdomen Trauma: no  Best sitting/standing/leaning forward: no change  Red Flags Fecal/urinary incontinence: no  Numbness/Weakness: no  Fever/chills/sweats: no  Night pain: no  Unexplained weight loss: yes, but weight loss most likely attributable to advanced malignancy state of patient  relief with bedrest: yes  h/o cancer/immunosuppression: yes  IV drug use: no  PMH of osteoporosis or chronic steroid use: no   ED Visit: 2 view spine Xray 7/26 showed only mild degenerative changes. 3 view right hip showed mild degenerative changes.   Patient recently changes her therapeutic goals to palliative care.  She is receiving palliative chemotherapy under Dr Calton Dach care.  She has referred Ms Blandino to [facility] who will be seeing her for an initial visit at her home this afternoon.   Cough Onset: couple months ago  Course stable  Severity: moderate Worse with: lying down Better with: Ventolin  Symptoms Sputum:no  Fever: no  Shortness of breath:yes, but at baseline  Leg Swelling:no  Heart Burn or Reflux:no  Wheezing:yes, occasionally  Post Nasal Drip: no  Rhinorrhea: no    Red Flags Weight Loss:  yes, metastatic lung cancer Immunocompromised:  yes  PMH Asthma or COPD: yes  PMH of Smoking: yes  Using ACEIs: no   SH: Continues to smoke- declined assistance with cessation Review of Systems See HPI    Objective:   Physical Exam VS  reviewed Ambulation SaO2 = 92% with HR 104 bpm walking 200 ft with Rollator GEN: Alert, Cooperative, Groomed, NAD COR: RRR, No M/G/R, No JVD, Normal PMI size and location LUNGS: Dry cough, right lower lung field crackles that did not clear post-tussively. No wheezing.  EXT: No peripheral leg edema. SKIN: No lesion nor rashes of face/trunk/extremities Neuro: Oriented to person, place, and time; Rose out of chair unassisted using chair arms.  1+ Gait: subnormal speed using Rollator, No significant path deviation, no full swing phase Psych: mood with appropriate variety to situation and her personality.  Normal thought/speech/language    Assessment & Plan:  30 minutes face to face where spent in total with supportive care and listening took more than 50% of the total time.

## 2017-01-08 NOTE — Assessment & Plan Note (Signed)
Tolerating Mirtazipine recently started by Dr Alvy Bimler

## 2017-01-08 NOTE — Patient Instructions (Signed)
[facility] Discharge Instructions for Patients Receiving Chemotherapy  Today you received the following chemotherapy agents: Abraxane   To help prevent nausea and vomiting after your treatment, we encourage you to take your nausea medication as directed.    If you develop nausea and vomiting that is not controlled by your nausea medication, call the clinic.   BELOW ARE SYMPTOMS THAT SHOULD BE REPORTED IMMEDIATELY:  *FEVER GREATER THAN 100.5 F  *CHILLS WITH OR WITHOUT FEVER  NAUSEA AND VOMITING THAT IS NOT CONTROLLED WITH YOUR NAUSEA MEDICATION  *UNUSUAL SHORTNESS OF BREATH  *UNUSUAL BRUISING OR BLEEDING  TENDERNESS IN MOUTH AND THROAT WITH OR WITHOUT PRESENCE OF ULCERS  *URINARY PROBLEMS  *BOWEL PROBLEMS  UNUSUAL RASH Items with * indicate a potential emergency and should be followed up as soon as possible.  Feel free to call the clinic you have any questions or concerns. The clinic phone number is [phone].  Please show the [place] at check-in to the Emergency Department and triage nurse.   Nanoparticle Albumin-Bound Paclitaxel injection What is this medicine? NANOPARTICLE ALBUMIN-BOUND PACLITAXEL (Na no PAHR ti kuhl al BYOO muhn-bound PAK li TAX el) is a chemotherapy drug. It targets fast dividing cells, like cancer cells, and causes these cells to die. This medicine is used to treat advanced breast cancer and advanced lung cancer. This medicine may be used for other purposes; ask your health care provider or pharmacist if you have questions. COMMON BRAND NAME(S): Abraxane What should I tell my health care provider before I take this medicine? They need to know if you have any of these conditions: -kidney disease -liver disease -low blood counts, like low platelets, red blood cells, or white blood cells -recent or ongoing radiation therapy -an unusual or allergic reaction to paclitaxel, albumin, other chemotherapy, other medicines, foods,  dyes, or preservatives -pregnant or trying to get pregnant -breast-feeding How should I use this medicine? This drug is given as an infusion into a vein. It is administered in a hospital or clinic by a specially trained health care professional. Talk to your pediatrician regarding the use of this medicine in children. Special care may be needed. Overdosage: If you think you have taken too much of this medicine contact a poison control center or emergency room at once. NOTE: This medicine is only for you. Do not share this medicine with others. What if I miss a dose? It is important not to miss your dose. Call your doctor or health care professional if you are unable to keep an appointment. What may interact with this medicine? -cyclosporine -diazepam -ketoconazole -medicines to increase blood counts like filgrastim, pegfilgrastim, sargramostim -other chemotherapy drugs like cisplatin, doxorubicin, epirubicin, etoposide, teniposide, vincristine -quinidine -testosterone -vaccines -verapamil Talk to your doctor or health care professional before taking any of these medicines: -acetaminophen -aspirin -ibuprofen -ketoprofen -naproxen This list may not describe all possible interactions. Give your health care provider a list of all the medicines, herbs, non-prescription drugs, or dietary supplements you use. Also tell them if you smoke, drink alcohol, or use illegal drugs. Some items may interact with your medicine. What should I watch for while using this medicine? Your condition will be monitored carefully while you are receiving this medicine. You will need important blood work done while you are taking this medicine. This medicine can cause serious allergic reactions. If you experience allergic reactions like skin rash, itching or hives, swelling of the face, lips, or tongue, tell your doctor or health  care professional right away. In some cases, you may be given additional medicines to  help with side effects. Follow all directions for their use. This drug may make you feel generally unwell. This is not uncommon, as chemotherapy can affect healthy cells as well as cancer cells. Report any side effects. Continue your course of treatment even though you feel ill unless your doctor tells you to stop. Call your doctor or health care professional for advice if you get a fever, chills or sore throat, or other symptoms of a cold or flu. Do not treat yourself. This drug decreases your body's ability to fight infections. Try to avoid being around people who are sick. This medicine may increase your risk to bruise or bleed. Call your doctor or health care professional if you notice any unusual bleeding. Be careful brushing and flossing your teeth or using a toothpick because you may get an infection or bleed more easily. If you have any dental work done, tell your dentist you are receiving this medicine. Avoid taking products that contain aspirin, acetaminophen, ibuprofen, naproxen, or ketoprofen unless instructed by your doctor. These medicines may hide a fever. Do not become pregnant while taking this medicine. Women should inform their doctor if they wish to become pregnant or think they might be pregnant. There is a potential for serious side effects to an unborn child. Talk to your health care professional or pharmacist for more information. Do not breast-feed an infant while taking this medicine. Men are advised not to father a child while receiving this medicine. What side effects may I notice from receiving this medicine? Side effects that you should report to your doctor or health care professional as soon as possible: -allergic reactions like skin rash, itching or hives, swelling of the face, lips, or tongue -low blood counts - This drug may decrease the number of white blood cells, red blood cells and platelets. You may be at increased risk for infections and bleeding. -signs of  infection - fever or chills, cough, sore throat, pain or difficulty passing urine -signs of decreased platelets or bleeding - bruising, pinpoint red spots on the skin, black, tarry stools, nosebleeds -signs of decreased red blood cells - unusually weak or tired, fainting spells, lightheadedness -breathing problems -changes in vision -chest pain -high or low blood pressure -mouth sores -nausea and vomiting -pain, swelling, redness or irritation at the injection site -pain, tingling, numbness in the hands or feet -slow or irregular heartbeat -swelling of the ankle, feet, hands Side effects that usually do not require medical attention (report to your doctor or health care professional if they continue or are bothersome): -aches, pains -changes in the color of fingernails -diarrhea -hair loss -loss of appetite This list may not describe all possible side effects. Call your doctor for medical advice about side effects. You may report side effects to FDA at 1-800-FDA-1088. Where should I keep my medicine? This drug is given in a hospital or clinic and will not be stored at home. NOTE: This sheet is a summary. It may not cover all possible information. If you have questions about this medicine, talk to your doctor, pharmacist, or health care provider.  2018 [employer]vier/Gold Standard (2015-03-06 10:05:20)

## 2017-01-11 ENCOUNTER — Telehealth: Payer: Self-pay | Admitting: Hematology and Oncology

## 2017-01-11 ENCOUNTER — Other Ambulatory Visit: Payer: Self-pay | Admitting: Hematology and Oncology

## 2017-01-11 ENCOUNTER — Telehealth: Payer: Self-pay | Admitting: *Deleted

## 2017-01-11 DIAGNOSIS — C7931 Secondary malignant neoplasm of brain: Secondary | ICD-10-CM

## 2017-01-11 DIAGNOSIS — C7949 Secondary malignant neoplasm of other parts of nervous system: Secondary | ICD-10-CM

## 2017-01-11 DIAGNOSIS — C3412 Malignant neoplasm of upper lobe, left bronchus or lung: Secondary | ICD-10-CM

## 2017-01-11 DIAGNOSIS — Z7189 Other specified counseling: Secondary | ICD-10-CM

## 2017-01-11 NOTE — Telephone Encounter (Signed)
-----   Message from Beatriz Chancellor, RN sent at 01/08/2017  2:36 PM EDT ----- Regarding: Dr. Harlon Ditty Follow Up Call Pt of Dr. Alvy Bimler first time Abraxane. Patient tolerated well.

## 2017-01-11 NOTE — Telephone Encounter (Signed)
Called patient for chemo follow up. States she did fine, but is very tired. Is eating and drinking OK, not as much as she should . Encouraged to increase po intake and to call us if she cannot. No N/V.

## 2017-01-11 NOTE — Progress Notes (Signed)
DISCONTINUE OFF PATHWAY REGIMEN - Non-Small Cell Lung   [id number]:Nab-Paclitaxel (Abraxane(R)) 100 mg/m2 D1,8,15 q28 days:   A cycle is every 28 days (3 weeks on and 1 week off):     Nab-paclitaxel (protein bound)   **Always confirm dose/schedule in your pharmacy ordering system**    REASON: Other Reason PRIOR TREATMENT: Off Pathway: Nab-Paclitaxel (Abraxane(R)) 100 mg/m2 D1,8,15 q28 days TREATMENT RESPONSE: Unable to Evaluate  START OFF PATHWAY REGIMEN - Non-Small Cell Lung   [id number]:Paclitaxel 80 mg/m2 3 Weeks on, 1 Week off:   A cycle is every 28 days (3 weeks on and 1 week off):     Paclitaxel   **Always confirm dose/schedule in your pharmacy ordering system**    Administration Notes: insurance does not allow abraxane  Patient Characteristics: Stage IV Metastatic, Non Squamous, Fourth Line and Beyond - Chemotherapy/Immunotherapy, PS = 0, 1, Prior PD-1/PD-L1 Inhibitor or No Prior PD-1/PD-L1 Inhibitor and Not a Candidate for Immunotherapy AJCC T Category: T4 Current Disease Status: Distant Metastases AJCC N Category: N1 AJCC M Category: M1c AJCC 8 Stage Grouping: IVB Histology: Non Squamous Cell ROS1 Rearrangement Status: Negative T790M Mutation Status: Not Applicable - EGFR Mutation Negative/Unknown Other Mutations/Biomarkers: No Other Actionable Mutations PD-L1 Expression Status: PD-L1 Negative Chemotherapy/Immunotherapy LOT: Fourth Line and Beyond Chemotherapy/Immunotherapy Molecular Targeted Therapy: Not Appropriate ALK Translocation Status: Negative Would you be surprised if this patient died  in the next year? I would be surprised if this patient died in the next year EGFR Mutation Status: Negative/Wild Type BRAF V600E Mutation Status: Negative Performance Status: PS = 0, 1 Immunotherapy Candidate Status: Not a Candidate for Immunotherapy Prior Immunotherapy Status: Prior PD-1/PD-L1 Inhibitor Intent of Therapy: Non-Curative / Palliative Intent, Discussed with  Patient

## 2017-01-11 NOTE — Telephone Encounter (Signed)
I had called her insurance company 3 times.  Finally, I got hold off Dr. Truddie Coco who denied the plan to give her chemotherapy with Abraxane. He states that he will approve weekly Taxol instead.  The treatment is changed to paclitaxel

## 2017-01-13 ENCOUNTER — Other Ambulatory Visit: Payer: Self-pay | Admitting: *Deleted

## 2017-01-13 NOTE — Patient Outreach (Signed)
[facility]) Care Management  01/13/2017  Lauren [name] December 19, 1950 [id number]   RN Health Coach received telephone call from patient.  [employer] compliance verified. Per patient she is has a rash on hands and face, her appetite is very poor. Per patient her feet feel real hot. Patient stated she feels a little light headed when she stands up.  Plan : RN told patient she needs to make her oncologist aware since this is a new chemotherapy agent RN discussed patient about falls prevention .  [facility] Care Management [phone]

## 2017-01-14 ENCOUNTER — Telehealth: Payer: Self-pay | Admitting: *Deleted

## 2017-01-14 NOTE — Telephone Encounter (Signed)
Spoke with patient, state she is having lower left side pain, feet are hot at night and a rash on her hands.. Per dr Alvy Bimler she has pain medication at home along with neurontin, patient did confess that she does not take those medications. She has an appt with dr Alvy Bimler tomorrow and will discuss further necessity of taking medications as directed.

## 2017-01-15 ENCOUNTER — Other Ambulatory Visit (HOSPITAL_BASED_OUTPATIENT_CLINIC_OR_DEPARTMENT_OTHER): Payer: Medicare HMO

## 2017-01-15 ENCOUNTER — Ambulatory Visit (HOSPITAL_BASED_OUTPATIENT_CLINIC_OR_DEPARTMENT_OTHER): Payer: Medicare HMO

## 2017-01-15 ENCOUNTER — Encounter: Payer: Self-pay | Admitting: Hematology and Oncology

## 2017-01-15 ENCOUNTER — Ambulatory Visit: Payer: Medicare HMO

## 2017-01-15 ENCOUNTER — Telehealth: Payer: Self-pay | Admitting: Hematology and Oncology

## 2017-01-15 ENCOUNTER — Ambulatory Visit (HOSPITAL_BASED_OUTPATIENT_CLINIC_OR_DEPARTMENT_OTHER): Payer: Self-pay | Admitting: Hematology and Oncology

## 2017-01-15 ENCOUNTER — Ambulatory Visit: Payer: Self-pay | Admitting: *Deleted

## 2017-01-15 VITALS — BP 153/82 | HR 108 | Temp 97.7°F | Resp 17

## 2017-01-15 DIAGNOSIS — C3412 Malignant neoplasm of upper lobe, left bronchus or lung: Secondary | ICD-10-CM

## 2017-01-15 DIAGNOSIS — C7931 Secondary malignant neoplasm of brain: Secondary | ICD-10-CM | POA: Diagnosis not present

## 2017-01-15 DIAGNOSIS — T451X5A Adverse effect of antineoplastic and immunosuppressive drugs, initial encounter: Secondary | ICD-10-CM

## 2017-01-15 DIAGNOSIS — C7949 Secondary malignant neoplasm of other parts of nervous system: Secondary | ICD-10-CM

## 2017-01-15 DIAGNOSIS — D6481 Anemia due to antineoplastic chemotherapy: Secondary | ICD-10-CM

## 2017-01-15 DIAGNOSIS — Z5111 Encounter for antineoplastic chemotherapy: Secondary | ICD-10-CM

## 2017-01-15 DIAGNOSIS — G620 Drug-induced polyneuropathy: Secondary | ICD-10-CM

## 2017-01-15 DIAGNOSIS — J449 Chronic obstructive pulmonary disease, unspecified: Secondary | ICD-10-CM

## 2017-01-15 DIAGNOSIS — Z7189 Other specified counseling: Secondary | ICD-10-CM

## 2017-01-15 DIAGNOSIS — Z720 Tobacco use: Secondary | ICD-10-CM

## 2017-01-15 LAB — COMPREHENSIVE METABOLIC PANEL
AST: 10 U/L (ref 5–34)
Albumin: 3 g/dL — ABNORMAL LOW (ref 3.5–5.0)
Alkaline Phosphatase: 121 U/L (ref 40–150)
Anion Gap: 6 mEq/L (ref 3–11)
BILIRUBIN TOTAL: 0.4 mg/dL (ref 0.20–1.20)
BUN: 11.2 mg/dL (ref 7.0–26.0)
CHLORIDE: 108 meq/L (ref 98–109)
CO2: 27 meq/L (ref 22–29)
CREATININE: 1 mg/dL (ref 0.6–1.1)
Calcium: 9.3 mg/dL (ref 8.4–10.4)
EGFR: 68 mL/min/{1.73_m2} — ABNORMAL LOW (ref >90)
GLUCOSE: 97 mg/dL (ref 70–140)
Potassium: 3.6 mEq/L (ref 3.5–5.1)
Sodium: 142 mEq/L (ref 136–145)
Total Protein: 6.8 g/dL (ref 6.4–8.3)

## 2017-01-15 LAB — CBC WITH DIFFERENTIAL/PLATELET
BASO%: 0.2 % (ref 0.0–2.0)
Basophils Absolute: 0 10*3/uL (ref 0.0–0.1)
EOS%: 0.4 % (ref 0.0–7.0)
Eosinophils Absolute: 0 10*3/uL (ref 0.0–0.5)
HCT: 34.6 % — ABNORMAL LOW (ref 34.8–46.6)
HGB: 10.4 g/dL — ABNORMAL LOW (ref 11.6–15.9)
LYMPH#: 0.9 10*3/uL (ref 0.9–3.3)
LYMPH%: 18.3 % (ref 14.0–49.7)
MCH: 25.7 pg (ref 25.1–34.0)
MCHC: 30.1 g/dL — ABNORMAL LOW (ref 31.5–36.0)
MCV: 85.6 fL (ref 79.5–101.0)
MONO#: 0.4 10*3/uL (ref 0.1–0.9)
MONO%: 8.3 % (ref 0.0–14.0)
NEUT#: 3.6 10*3/uL (ref 1.5–6.5)
NEUT%: 72.8 % (ref 38.4–76.8)
PLATELETS: 193 10*3/uL (ref 145–400)
RBC: 4.04 10*6/uL (ref 3.70–5.45)
RDW: 20.5 % — ABNORMAL HIGH (ref 11.2–14.5)
WBC: 4.9 10*3/uL (ref 3.9–10.3)

## 2017-01-15 MED ORDER — DEXAMETHASONE SODIUM PHOSPHATE 10 MG/ML IJ SOLN
10.0000 mg | Freq: Once | INTRAMUSCULAR | Status: AC
  Administered 2017-01-15: 10 mg via INTRAVENOUS

## 2017-01-15 MED ORDER — DIPHENHYDRAMINE HCL 50 MG/ML IJ SOLN
50.0000 mg | Freq: Once | INTRAMUSCULAR | Status: AC
  Administered 2017-01-15: 50 mg via INTRAVENOUS

## 2017-01-15 MED ORDER — FAMOTIDINE IN NACL 20-0.9 MG/50ML-% IV SOLN
INTRAVENOUS | Status: AC
  Filled 2017-01-15: qty 50

## 2017-01-15 MED ORDER — HEPARIN SOD (PORK) LOCK FLUSH 100 UNIT/ML IV SOLN
500.0000 [IU] | Freq: Once | INTRAVENOUS | Status: AC | PRN
  Administered 2017-01-15: 500 [IU]
  Filled 2017-01-15: qty 5

## 2017-01-15 MED ORDER — SODIUM CHLORIDE 0.9 % IV SOLN: 10.0000 mg | Freq: Once | INTRAVENOUS | Status: DC

## 2017-01-15 MED ORDER — SODIUM CHLORIDE 0.9 % IJ SOLN
10.0000 mL | INTRAMUSCULAR | Status: AC | PRN
  Administered 2017-01-15: 10 mL via INTRAVENOUS
  Filled 2017-01-15: qty 10

## 2017-01-15 MED ORDER — DEXAMETHASONE SODIUM PHOSPHATE 10 MG/ML IJ SOLN
INTRAMUSCULAR | Status: AC
  Filled 2017-01-15: qty 1

## 2017-01-15 MED ORDER — PACLITAXEL CHEMO INJECTION 300 MG/50ML
80.0000 mg/m2 | Freq: Once | INTRAVENOUS | Status: AC
  Administered 2017-01-15: 144 mg via INTRAVENOUS
  Filled 2017-01-15: qty 24

## 2017-01-15 MED ORDER — SODIUM CHLORIDE 0.9 % IV SOLN
Freq: Once | INTRAVENOUS | Status: AC
  Administered 2017-01-15: 15:00:00 via INTRAVENOUS

## 2017-01-15 MED ORDER — FAMOTIDINE IN NACL 20-0.9 MG/50ML-% IV SOLN
20.0000 mg | Freq: Once | INTRAVENOUS | Status: AC
  Administered 2017-01-15: 20 mg via INTRAVENOUS

## 2017-01-15 MED ORDER — SODIUM CHLORIDE 0.9% FLUSH
10.0000 mL | INTRAVENOUS | Status: DC | PRN
  Administered 2017-01-15: 10 mL
  Filled 2017-01-15: qty 10

## 2017-01-15 MED ORDER — DIPHENHYDRAMINE HCL 50 MG/ML IJ SOLN
INTRAMUSCULAR | Status: AC
  Filled 2017-01-15: qty 1

## 2017-01-15 NOTE — Assessment & Plan Note (Signed)
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

## 2017-01-15 NOTE — Patient Instructions (Signed)
[facility] Discharge Instructions for Patients Receiving Chemotherapy  Today you received the following chemotherapy agents Taxol   To help prevent nausea and vomiting after your treatment, we encourage you to take your nausea medication as directed.   If you develop nausea and vomiting that is not controlled by your nausea medication, call the clinic.   BELOW ARE SYMPTOMS THAT SHOULD BE REPORTED IMMEDIATELY:  *FEVER GREATER THAN 100.5 F  *CHILLS WITH OR WITHOUT FEVER  NAUSEA AND VOMITING THAT IS NOT CONTROLLED WITH YOUR NAUSEA MEDICATION  *UNUSUAL SHORTNESS OF BREATH  *UNUSUAL BRUISING OR BLEEDING  TENDERNESS IN MOUTH AND THROAT WITH OR WITHOUT PRESENCE OF ULCERS  *URINARY PROBLEMS  *BOWEL PROBLEMS  UNUSUAL RASH Items with * indicate a potential emergency and should be followed up as soon as possible.  Feel free to call the clinic you have any questions or concerns. The clinic phone number is [phone].  Please show the CHEMO ALERT CARD at check-in to the Emergency Department and triage nurse.   

## 2017-01-15 NOTE — Assessment & Plan Note (Signed)
She continues to have mild productive cough with COPD She is not able to quit smoking She has been prescribed anti-tussives and other medication and she is on inhaler for COPD Continue conservative management

## 2017-01-15 NOTE — Assessment & Plan Note (Signed)
Unfortunately, her insurance has declined placement for Abraxane I would recommend switching her to Taxol. We discussed the role of chemotherapy. The intent is for palliative.  We discussed some of the risks, benefits, side-effects of Taxol  Some of the short term side-effects included, though not limited to, risk of severe allergic reaction, fatigue, weight loss, pancytopenia, life-threatening infections, need for transfusions of blood products, nausea, vomiting, change in bowel habits, loss of hair, admission to hospital for various reasons, and risks of death.   Long term side-effects are also discussed including risks of infertility, permanent damage to nerve function, chronic fatigue, and rare secondary malignancy including bone marrow disorders.   The patient is aware that the response rates discussed earlier is not guaranteed.    After a long discussion, patient made an informed decision to proceed with the prescribed plan of care and went ahead to sign the consent form today.   Patient education material was dispensed I will see her next week to assess response to treatment

## 2017-01-15 NOTE — Telephone Encounter (Signed)
Scheduled appt per los - Gave patient AVS and calender per los.

## 2017-01-15 NOTE — Progress Notes (Signed)
[place] OFFICE PROGRESS NOTE  Patient Care Team: McDiarmid, Blane Ohara, MD as PCP - General (Family Medicine) Gaye Pollack, MD (Cardiothoracic Surgery) Dickie La, MD (Family Medicine) Heath Lark, MD as Consulting Physician (Hematology and Oncology) Pleasant, Eppie Gibson, RN as [place], [place], [place] (Optometry) Milus Banister, MD as Consulting Physician (Gastroenterology) Melida Quitter, MD as Consulting Physician (Otolaryngology) Standley Brooking, LCSW as [place] Management (Licensed Clinical Social Worker)  SUMMARY OF ONCOLOGIC HISTORY: Oncology History   Colon cancer   Primary site: Colon and Rectum (Left)   Staging method: AJCC 7th Edition   Clinical: Stage I (T2, N0, M0) signed by Heath Lark, MD on 05/31/2013  2:39 PM   Pathologic: Stage I (T2, N0, cM0) signed by Heath Lark, MD on 05/31/2013  2:39 PM   Summary: Stage I (T2, N0, cM0) Lung cancer, EGFR/ALK negative, recurrence after initial resection to LN and brain   Primary site: Lung (Left)   Staging method: AJCC 7th Edition   Clinical: Stage IV (T1, N2, M1b) signed by Heath Lark, MD on 05/31/2013  2:26 PM   Pathologic: Stage IV (T1, N2, M1b) signed by Heath Lark, MD on 05/31/2013  2:26 PM   Summary: Stage IV (T1, N2, M1b)       Cancer of upper lobe of left lung, Adenocarcinoma   03/01/2007 Procedure    Colonoscopy revealed abnormalities and biopsy show high-grade dysplasia      04/01/2007 Surgery    She underwent sigmoid resection which showed T2 N0 colon cancer, and negative margins and all of 17 lymph nodes were negative      02/29/2008 Procedure    Repeat surveillance colonoscopy was negative.      06/16/2010 Surgery    She underwent left upper lobectomy we show well-differentiated adenocarcinoma of the lung, T1, N0, M0      03/18/2011 Procedure    Repeat colonoscopy show multiple polyps but there were benign      10/19/2011  Procedure    Biopsy of mediastinal lymph node came back positive for recurrence of lung cancer, EGFR and ALK negative      11/16/2011 - 12/14/2011 Chemotherapy    She received concurrent chemoradiation therapy with weekly carboplatin and Taxol.      11/16/2011 - 12/29/2011 Radiation Therapy    She received radiation therapy with weekly chemotherapy      02/15/2012 Imaging    MR of the brain showed a new intracranial metastases. This was subsequently treated with stereotactic radiosurgery.      03/07/2012 - 04/12/2013 Chemotherapy    She received chemotherapy with maintainence Alimta every 3 weeks. Chemotherapy was discontinued due to profound fatigue      03/02/2013 Procedure    She had therapeutic ultrasound guidance thoracentesis for pleural effusion that came back negative for cancer      05/04/2013 Procedure    She had repeat ultrasound-guided thoracentesis again and cytology was negative      05/29/2013 Imaging    Repeat CT scan of the chest, abdomen and pelvis show no evidence of disease but persistent right-sided pleural effusion      06/02/2013 Surgery    The patient had placement of Pleurx catheter and subsequently underwent pleurodesis.      09/22/2013 Imaging    Repeat CT scan show no evidence of active disease. There are nonspecific lymphadenopathy and she is placed on observation.      03/23/2014 Imaging  Repeat CT scan of the chest, abdomen and pelvis show recurrence of cancer with widespread bilateral pulmonary metastasis.      05/14/2014 Imaging    Imaging of the chest and brain were repeated due to delay of initiation of chemotherapy. Overall, chest CT scan show stable disease. MRI of the head was negative for recurrence      05/16/2014 - 07/18/2014 Chemotherapy     she completed 4 cycles of combination chemotherapy with carboplatin and Alimta      07/16/2014 Imaging     repeat CT scan of the chest, abdomen and pelvis show regression in the size of lung  nodules.      08/29/2014 - 08/14/2015 Chemotherapy    She received maintenance Alimta      10/16/2014 Imaging     repeat CT scan of the chest, abdomen and pelvis show regression in the size of lung nodules.      01/29/2015 Imaging    Repeat CT scan showed stable disease      02/01/2015 Imaging    MRI brain is negative      04/30/2015 Imaging    CT scan of the chest, abdomen and pelvis showed stable disease      07/25/2015 Imaging    MRI brain showed no evidence of new disease      09/09/2015 Imaging    CT chest showed interval increase in size of large RIGHT upper lobe nodule is most consistent with lung cancer recurrence.      09/18/2015 - 03/26/2016 Chemotherapy    She started on Nivolumab      10/22/2015 Imaging    Screening mammogram showed mild abnormality      10/31/2015 Imaging    Diagnostic mammogram showed mild calcification at the right upper outer breast      11/27/2015 Imaging    Stable post treatment related changes of left lower lobectomy and radiation therapy redemonstrated, as above. No definite findings to suggest local recurrence of disease or metastatic disease on today's examination      01/24/2016 Imaging    MR brain showed continued stable appearance of two small treated brain metastases. No new or progressive metastatic disease to the brain.      04/15/2016 Imaging    Ct scan showed findings highly suspicious for recurrent tumor in the mediastinum surrounding the right mainstem bronchus and in the region of the azygoesophageal recess. Diffuse pulmonary metastatic disease with new and progressive nodules since the prior examination.      04/24/2016 - 12/18/2016 Chemotherapy    The patient had chemotherapy with gemzar and Carboplatin      04/30/2016 Imaging    Decreased contrast enhancement of the two treated metastases in the right frontal and left occipital lobes. No new lesions.      06/01/2016 Imaging    CXR showed numerous metastatic nodules have  progressed compared to the most recent comparison chest x-ray of 07/15/2015. Majority of these were noted on the 04/15/2016 chest CT. No segmental infiltrate or pulmonary edema.      06/19/2016 Adverse Reaction    Treatment is placed on hold today due to neutropenia and the patient not feeling well      06/29/2016 Imaging    Interval decrease in size of metastatic pulmonary nodules. 2. Persistent but slightly improved right hilar/mediastinal disease. No progressive findings. 3. No findings for upper abdominal metastatic disease      10/01/2016 Imaging    Mild increase in size of 2.5 cm central right  lower lobe pulmonary nodule, consistent with metastatic disease. Numerous other bilateral sub-cm pulmonary nodules are stable.  Stable mild subcarinal and right cardiophrenic angle lymphadenopathy. Stable emphysema and right lung radiation changes. Stable hepatic steatosis. Aortic and coronary artery atherosclerosis.      11/06/2016 Imaging    MR brain: Stable treated RIGHT insula and LEFT occipital metastases.      12/30/2016 Imaging    1. Bilateral pulmonary nodules are minimally progressive from 10/01/2016. Dominant nodule in the superior segment right lower lobe is stable. 2. Aortic atherosclerosis (ICD10-170.0). Coronary artery calcification.      01/08/2017 - 01/08/2017 Chemotherapy    She received 1 dose of Abraxane, discontinued because of insurance coverage       Brain metastases treated with radiosurgery   02/26/2012 Initial Diagnosis    Brain metastases treated with radiosurgery       INTERVAL HISTORY: Please see below for problem oriented charting. She returns for further follow-up. She has significant peripheral neuropathy after treatment last week, improving She also have mild skin rash on her palms of her hands, mildly itchy but stable She had myalgias and arthralgias, improving She has mild productive cough but no hemoptysis Appetite is less since her treatment last  week She also complained of flare peripheral neuropathy but has not been taking medications as instructed No fever or chills.  REVIEW OF SYSTEMS:   Constitutional: Denies fevers, chills  Eyes: Denies blurriness of vision Ears, nose, mouth, throat, and face: Denies mucositis or sore throat Cardiovascular: Denies palpitation, chest discomfort or lower extremity swelling Gastrointestinal:  Denies nausea, heartburn or change in bowel habits Lymphatics: Denies new lymphadenopathy or easy bruising Behavioral/Psych: Mood is stable, no new changes  All other systems were reviewed with the patient and are negative.  I have reviewed the past medical history, past surgical history, social history and family history with the patient and they are unchanged from previous note.  ALLERGIES:  is allergic to adhesive [tape] and lisinopril.  MEDICATIONS:  Current Outpatient Prescriptions  Medication Sig Dispense Refill  . BYSTOLIC 10 MG tablet Take 10 mg by mouth daily.     . diclofenac sodium (VOLTAREN) 1 % GEL Apply 2 g topically 4 (four) times daily as needed. (Patient taking differently: Apply 2 g topically 4 (four) times daily as needed (pain). ) 100 g 1  . HYDROmorphone (DILAUDID) 8 MG tablet Take 1 tablet (8 mg total) by mouth every 6 (six) hours as needed for severe pain. 60 tablet 0  . ipratropium (ATROVENT HFA) 17 MCG/ACT inhaler Inhale 2 puffs into the lungs every 4 (four) hours as needed for wheezing (Cough). 3 Inhaler 3  . lidocaine-prilocaine (EMLA) cream APPLY TOPICALLY AS NEEDED TO PORTA CATH SITE 1 HOUR PRIOR TO NEEDLE STICK. 30 g 0  . loratadine (CLARITIN) 10 MG tablet Take 10 mg by mouth daily.    Marland Kitchen LORazepam (ATIVAN) 0.5 MG tablet Take 1 tablet (0.5 mg total) by mouth every 8 (eight) hours as needed for anxiety. 60 tablet 1  . mirtazapine (REMERON) 15 MG tablet Take 1 tablet (15 mg total) by mouth at bedtime. 30 tablet 11  . nitroGLYCERIN (NITROSTAT) 0.4 MG SL tablet Place 1 tablet (0.4  mg total) under the tongue every 5 (five) minutes as needed. For chest pain 25 tablet 5  . omeprazole (PRILOSEC) 40 MG capsule Take 40 mg by mouth daily.     . ondansetron (ZOFRAN ODT) 4 MG disintegrating tablet Take 1 tablet (4 mg total) by  mouth every 8 (eight) hours as needed for nausea or vomiting. 20 tablet 0  . pravastatin (PRAVACHOL) 40 MG tablet Take 40 mg by mouth daily.     Marland Kitchen umeclidinium-vilanterol (ANORO ELLIPTA) 62.5-25 MCG/INH AEPB Inhale 1 puff into the lungs daily. 3 each 3  . VENTOLIN HFA 108 (90 Base) MCG/ACT inhaler Inhale 2 puffs into the lungs every 4 (four) hours as needed for wheezing or shortness of breath. 3 Inhaler 3   No current facility-administered medications for this visit.    Facility-Administered Medications Ordered in Other Visits  Medication Dose Route Frequency Provider Last Rate Last Dose  . sodium chloride 0.9 % injection 10 mL  10 mL Intravenous PRN Bertis Ruddy, Saranne Crislip, MD   10 mL at 09/18/15 1345  . sodium chloride 0.9 % injection 10 mL  10 mL Intravenous PRN Bertis Ruddy, Hermann Dottavio, MD   10 mL at 04/24/16 1136  . sodium chloride 0.9 % injection 10 mL  10 mL Intravenous PRN Bertis Ruddy, Rooney Gladwin, MD   10 mL at 01/15/17 1322    PHYSICAL EXAMINATION: ECOG PERFORMANCE STATUS: 1 - Symptomatic but completely ambulatory  Vitals:   01/15/17 1335  BP: (!) 145/74  Pulse: (!) 113  Resp: 18  Temp: 98.8 F (37.1 C)  SpO2: 100%   Filed Weights   01/15/17 1335  Weight: 153 lb 4.8 oz (69.5 kg)    GENERAL:alert, no distress and comfortable SKIN: mild skin rash noted on her palms EYES: normal, Conjunctiva are pink and non-injected, sclera clear OROPHARYNX:no exudate, no erythema and lips, buccal mucosa, and tongue normal  NECK: supple, thyroid normal size, non-tender, without nodularity LYMPH:  no palpable lymphadenopathy in the cervical, axillary or inguinal LUNGS: clear to auscultation and percussion with normal breathing effort HEART: regular rate & rhythm and no murmurs and no  lower extremity edema ABDOMEN:abdomen soft, non-tender and normal bowel sounds Musculoskeletal:no cyanosis of digits and no clubbing  NEURO: alert & oriented x 3 with fluent speech, no focal motor/sensory deficits  LABORATORY DATA:  I have reviewed the data as listed    Component Value Date/Time   NA 141 01/08/2017 1018   K 3.5 01/08/2017 1018   CL 106 11/06/2016 1437   CL 102 10/24/2012 1001   CO2 29 01/08/2017 1018   GLUCOSE 86 01/08/2017 1018   GLUCOSE 111 (H) 10/24/2012 1001   BUN 10.3 01/08/2017 1018   CREATININE 1.0 01/08/2017 1018   CALCIUM 9.3 01/08/2017 1018   PROT 6.6 01/08/2017 1018   ALBUMIN 2.9 (L) 01/08/2017 1018   AST 10 01/08/2017 1018   ALT <6 01/08/2017 1018   ALKPHOS 137 01/08/2017 1018   BILITOT 0.31 01/08/2017 1018   GFRNONAA 55 (L) 11/06/2016 1437   GFRAA >60 11/06/2016 1437    No results found for: SPEP, UPEP  Lab Results  Component Value Date   WBC 4.9 01/15/2017   NEUTROABS 3.6 01/15/2017   HGB 10.4 (L) 01/15/2017   HCT 34.6 (L) 01/15/2017   MCV 85.6 01/15/2017   PLT 193 01/15/2017      Chemistry      Component Value Date/Time   NA 141 01/08/2017 1018   K 3.5 01/08/2017 1018   CL 106 11/06/2016 1437   CL 102 10/24/2012 1001   CO2 29 01/08/2017 1018   BUN 10.3 01/08/2017 1018   CREATININE 1.0 01/08/2017 1018      Component Value Date/Time   CALCIUM 9.3 01/08/2017 1018   ALKPHOS 137 01/08/2017 1018   AST 10 01/08/2017 1018  ALT <6 01/08/2017 1018   BILITOT 0.31 01/08/2017 1018       RADIOGRAPHIC STUDIES: I have personally reviewed the radiological images as listed and agreed with the findings in the report. Ct Chest W Contrast  Result Date: 12/30/2016 CLINICAL DATA:  Metastatic lung cancer. Maintenance chemotherapy ongoing. EXAM: CT CHEST WITH CONTRAST TECHNIQUE: Multidetector CT imaging of the chest was performed during intravenous contrast administration. CONTRAST:  75 cc Isovue-300. COMPARISON:  10/01/2016. FINDINGS:  Cardiovascular: Right IJ Port-A-Cath terminates in the right atrium. Atherosclerotic calcification of the arterial vasculature, including coronary arteries. Heart size normal. No pericardial effusion. Mediastinum/Nodes: Subcarinal lymph node measures 1.7 cm, stable. There is ill-defined soft tissue encasing the right hilum, as before. No left hilar or axillary adenopathy. Pre pericardiac and juxta diaphragmatic lymph nodes measure up to 8 mm, as before. Esophagus is grossly unremarkable. Lungs/Pleura: Post treatment scarring and volume loss with bronchiectasis in the posteromedial upper and mid right hemithorax. A 2.8 cm nodule in the medial aspect of the superior segment right lower lobe is stable. There is additional scattered nodularity in the lungs bilaterally, in the peribronchovascular distribution. Some nodules appear larger. Index nodule along the medial aspect of the minor fissure measures 9 mm (series 7, image 54), previously 6 mm. Nodularity along the right hilum (series 7, image 60) is more pronounced, now measuring 6 mm. Finally, medial segment right middle lobe nodule measures 9 mm (image 70), previously 6 mm. Left upper lobectomy. No pleural fluid. Adherent debris in the lower trachea. Airway is otherwise unremarkable. Upper Abdomen: Subcentimeter low-attenuation lesions in the liver are too small to characterize but stable. Visualized portions of the liver and right adrenal gland are unremarkable. Left adrenal thickening, as before. Visualized portions of the kidneys, spleen, pancreas, stomach and bowel are grossly unremarkable. No upper abdominal adenopathy. Musculoskeletal: No worrisome lytic or sclerotic lesions. IMPRESSION: 1. Bilateral pulmonary nodules are minimally progressive from 10/01/2016. Dominant nodule in the superior segment right lower lobe is stable. 2. Aortic atherosclerosis (ICD10-170.0). Coronary artery calcification. Electronically Signed   By: Lorin Picket M.D.   On:  12/30/2016 12:29    ASSESSMENT & PLAN:  Cancer of upper lobe of left lung, Adenocarcinoma Unfortunately, her insurance has declined placement for Abraxane I would recommend switching her to Taxol. We discussed the role of chemotherapy. The intent is for palliative.  We discussed some of the risks, benefits, side-effects of Taxol  Some of the short term side-effects included, though not limited to, risk of severe allergic reaction, fatigue, weight loss, pancytopenia, life-threatening infections, need for transfusions of blood products, nausea, vomiting, change in bowel habits, loss of hair, admission to hospital for various reasons, and risks of death.   Long term side-effects are also discussed including risks of infertility, permanent damage to nerve function, chronic fatigue, and rare secondary malignancy including bone marrow disorders.   The patient is aware that the response rates discussed earlier is not guaranteed.    After a long discussion, patient made an informed decision to proceed with the prescribed plan of care and went ahead to sign the consent form today.   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 The patient denies recent history of bleeding such as epistaxis, hematuria or hematochezia. She is asymptomatic from the anemia. I will observe for now.    COPD mixed type ([place]) She continues to have mild productive cough with COPD She is not able to quit smoking She has been prescribed anti-tussives and other medication and she is on  inhaler for COPD Continue conservative management   No orders of the defined types were placed in this encounter.  All questions were answered. The patient knows to call the clinic with any problems, questions or concerns. No barriers to learning was detected. I spent 20 minutes counseling the patient face to face. The total time spent in the appointment was 25 minutes and more than 50% was on counseling and review of test results     Heath Lark, MD 01/15/2017 1:51 PM

## 2017-01-15 NOTE — Assessment & Plan Note (Signed)
This is likely due to recent treatment. The patient denies recent history of bleeding such as epistaxis, hematuria or hematochezia. She is asymptomatic from the anemia. I will observe for now.   

## 2017-01-19 ENCOUNTER — Other Ambulatory Visit: Payer: Self-pay | Admitting: *Deleted

## 2017-01-19 ENCOUNTER — Telehealth: Payer: Self-pay

## 2017-01-19 ENCOUNTER — Telehealth: Payer: Self-pay | Admitting: *Deleted

## 2017-01-19 NOTE — Telephone Encounter (Signed)
Pt called yesterday d/t diarrhea.  Returned call today. Diarrhea started Saturday evening, all day Sunday and yesterday.  Ate coconut flakes and that stopped it.  Saturday evening had loose "adult" stool "like OK its cleaning out"., Sunday had 3 watery stool "like baby poot", yesterday had 2 watery stool  "like baby poop". Today has had no stool. She has gas and she is not sure if gas or stool, so hesistant to leave the house.  Now feels weak, she was scared to eat or drink or take imodium. Instructed her take imodium, drink 4-5 bottles of water per day, and to go ahead and eat easily digested food.   Uses [employer] for transportation.

## 2017-01-19 NOTE — Telephone Encounter (Signed)
Sounds like she is feeling better I recommend BRAT diet and extra hydration as tolerated

## 2017-01-19 NOTE — Telephone Encounter (Signed)
S/w pt that Dr Alvy Bimler saw message and concurred with what this RN told pt.

## 2017-01-19 NOTE — Patient Outreach (Signed)
[place] [facility] Care Management  01/19/2017  Lauren [name] 1950/12/29 [id number]  RN Health Coach received  telephone call from patient.  Hipaa compliance verified. Per patient she had been having nausea and vomiting and diarrhea. Patient had called the oncology advise line and was waiting on a return call back. Patient is very scared and felt out of control. Per patient this is making her feel so different from the other times she has received the chemotherapy. RN allowed patient to allay her feelings. RN discussed with patient about getting a few things to have on hand when this occurs. RN told her to get some Gatorade, ginger ale, cream of chicken soup, some microwave cream potatoes since this is her favorite and crackers. Patient has nausea medicine but didn't take it. RN discussed with patient about taking the medication as needed as the Dr prescribed. This is what it is for and will help you. RN explained that patient does need to make the  Doctor aware of her symptoms. While talking with the patient the advise line called. Patient will call me back. Patient called RN back and stated she did talk with the advise line and they had said mainly the same fluids and light food. Patient stated she felt much better. Patient stated she will get these things together for her chemo treatments. RN also told patient she can use peppermint and ginger oils to place in her rooms to help with the nausea since patient uses oils a lot but didn't have those fragrances.   [place] [facility] [phone].

## 2017-01-19 NOTE — Telephone Encounter (Signed)
Notified to do BRAT diet and extra hydration as tolerated

## 2017-01-20 ENCOUNTER — Other Ambulatory Visit: Payer: Self-pay | Admitting: *Deleted

## 2017-01-20 NOTE — Patient Outreach (Signed)
[place] [facility]) Care Management  01/20/2017  Lauren [name] [birth year] [id number]   CSW attempted to reach patient to follow-up on patient's care. Patient had planned to speak with her oncologist, Dr. Alvy Bimler about palliative care. No answer to patient's phone (#: [phone]), HIPPA compliant voicemail left, will await call back.    [name], LCSW [facility]  Clinical Social Worker cell #: [phone]

## 2017-01-22 ENCOUNTER — Telehealth: Payer: Self-pay | Admitting: *Deleted

## 2017-01-22 ENCOUNTER — Ambulatory Visit: Payer: Medicare HMO

## 2017-01-22 ENCOUNTER — Other Ambulatory Visit (HOSPITAL_BASED_OUTPATIENT_CLINIC_OR_DEPARTMENT_OTHER): Payer: Medicare HMO

## 2017-01-22 ENCOUNTER — Other Ambulatory Visit: Payer: Self-pay | Admitting: *Deleted

## 2017-01-22 ENCOUNTER — Ambulatory Visit (HOSPITAL_BASED_OUTPATIENT_CLINIC_OR_DEPARTMENT_OTHER): Payer: Medicare HMO

## 2017-01-22 DIAGNOSIS — C7949 Secondary malignant neoplasm of other parts of nervous system: Secondary | ICD-10-CM

## 2017-01-22 DIAGNOSIS — C7931 Secondary malignant neoplasm of brain: Secondary | ICD-10-CM

## 2017-01-22 DIAGNOSIS — Z5111 Encounter for antineoplastic chemotherapy: Secondary | ICD-10-CM

## 2017-01-22 DIAGNOSIS — C3412 Malignant neoplasm of upper lobe, left bronchus or lung: Secondary | ICD-10-CM

## 2017-01-22 DIAGNOSIS — Z7189 Other specified counseling: Secondary | ICD-10-CM

## 2017-01-22 LAB — CBC WITH DIFFERENTIAL/PLATELET
BASO%: 0.8 % (ref 0.0–2.0)
Basophils Absolute: 0 10*3/uL (ref 0.0–0.1)
EOS ABS: 0 10*3/uL (ref 0.0–0.5)
EOS%: 1.9 % (ref 0.0–7.0)
HEMATOCRIT: 32.7 % — AB (ref 34.8–46.6)
HEMOGLOBIN: 10.3 g/dL — AB (ref 11.6–15.9)
LYMPH#: 0.5 10*3/uL — AB (ref 0.9–3.3)
LYMPH%: 19.5 % (ref 14.0–49.7)
MCH: 26 pg (ref 25.1–34.0)
MCHC: 31.6 g/dL (ref 31.5–36.0)
MCV: 82.3 fL (ref 79.5–101.0)
MONO#: 0.2 10*3/uL (ref 0.1–0.9)
MONO%: 7 % (ref 0.0–14.0)
NEUT%: 70.8 % (ref 38.4–76.8)
NEUTROS ABS: 1.8 10*3/uL (ref 1.5–6.5)
PLATELETS: 169 10*3/uL (ref 145–400)
RBC: 3.97 10*6/uL (ref 3.70–5.45)
RDW: 22.5 % — ABNORMAL HIGH (ref 11.2–14.5)
WBC: 2.5 10*3/uL — AB (ref 3.9–10.3)

## 2017-01-22 LAB — COMPREHENSIVE METABOLIC PANEL
ALBUMIN: 3 g/dL — AB (ref 3.5–5.0)
ALK PHOS: 110 U/L (ref 40–150)
ALT: <6 U/L (ref 0–55)
ANION GAP: 7 meq/L (ref 3–11)
AST: 11 U/L (ref 5–34)
BUN: 9.9 mg/dL (ref 7.0–26.0)
CALCIUM: 9.2 mg/dL (ref 8.4–10.4)
CO2: 26 mEq/L (ref 22–29)
CREATININE: 1 mg/dL (ref 0.6–1.1)
Chloride: 106 mEq/L (ref 98–109)
EGFR: 67 mL/min/{1.73_m2} — AB (ref >90)
Glucose: 123 mg/dl (ref 70–140)
Potassium: 3.1 mEq/L — ABNORMAL LOW (ref 3.5–5.1)
Sodium: 140 mEq/L (ref 136–145)
Total Bilirubin: 0.51 mg/dL (ref 0.20–1.20)
Total Protein: 6.7 g/dL (ref 6.4–8.3)

## 2017-01-22 MED ORDER — ONDANSETRON HCL 4 MG/2ML IJ SOLN
INTRAMUSCULAR | Status: AC
  Filled 2017-01-22: qty 2

## 2017-01-22 MED ORDER — DEXAMETHASONE SODIUM PHOSPHATE 10 MG/ML IJ SOLN
INTRAMUSCULAR | Status: AC
  Filled 2017-01-22: qty 1

## 2017-01-22 MED ORDER — DEXAMETHASONE SODIUM PHOSPHATE 10 MG/ML IJ SOLN
10.0000 mg | Freq: Once | INTRAMUSCULAR | Status: AC
  Administered 2017-01-22: 10 mg via INTRAVENOUS

## 2017-01-22 MED ORDER — FAMOTIDINE IN NACL 20-0.9 MG/50ML-% IV SOLN
20.0000 mg | Freq: Once | INTRAVENOUS | Status: AC
  Administered 2017-01-22: 20 mg via INTRAVENOUS

## 2017-01-22 MED ORDER — ONDANSETRON HCL 4 MG/2ML IJ SOLN
8.0000 mg | Freq: Once | INTRAMUSCULAR | Status: AC
  Administered 2017-01-22: 8 mg via INTRAVENOUS

## 2017-01-22 MED ORDER — SODIUM CHLORIDE 0.9% FLUSH
10.0000 mL | INTRAVENOUS | Status: DC | PRN
  Administered 2017-01-22: 10 mL
  Filled 2017-01-22: qty 10

## 2017-01-22 MED ORDER — DIPHENHYDRAMINE HCL 50 MG/ML IJ SOLN
50.0000 mg | Freq: Once | INTRAMUSCULAR | Status: AC
  Administered 2017-01-22: 50 mg via INTRAVENOUS

## 2017-01-22 MED ORDER — SODIUM CHLORIDE 0.9 % IJ SOLN
10.0000 mL | INTRAMUSCULAR | Status: DC | PRN
  Administered 2017-01-22: 10 mL via INTRAVENOUS
  Filled 2017-01-22: qty 10

## 2017-01-22 MED ORDER — ONDANSETRON HCL 40 MG/20ML IJ SOLN: Freq: Once | INTRAMUSCULAR | Status: DC

## 2017-01-22 MED ORDER — SODIUM CHLORIDE 0.9 % IV SOLN
Freq: Once | INTRAVENOUS | Status: AC
  Administered 2017-01-22: 14:00:00 via INTRAVENOUS

## 2017-01-22 MED ORDER — HEPARIN SOD (PORK) LOCK FLUSH 100 UNIT/ML IV SOLN
500.0000 [IU] | Freq: Once | INTRAVENOUS | Status: AC | PRN
  Administered 2017-01-22: 500 [IU]
  Filled 2017-01-22: qty 5

## 2017-01-22 MED ORDER — PROCHLORPERAZINE MALEATE 10 MG PO TABS: 10.0000 mg | ORAL_TABLET | Freq: Four times a day (QID) | ORAL | 1 refills | Status: DC | PRN

## 2017-01-22 MED ORDER — DIPHENHYDRAMINE HCL 50 MG/ML IJ SOLN
INTRAMUSCULAR | Status: AC
  Filled 2017-01-22: qty 1

## 2017-01-22 MED ORDER — FAMOTIDINE IN NACL 20-0.9 MG/50ML-% IV SOLN
INTRAVENOUS | Status: AC
  Filled 2017-01-22: qty 50

## 2017-01-22 MED ORDER — PACLITAXEL CHEMO INJECTION 300 MG/50ML
80.0000 mg/m2 | Freq: Once | INTRAVENOUS | Status: AC
  Administered 2017-01-22: 144 mg via INTRAVENOUS
  Filled 2017-01-22: qty 24

## 2017-01-22 MED FILL — PROCHLORPERAZINE 10 MG TAB: 10 | 7 days supply | Qty: 30 | Fill #0

## 2017-01-22 NOTE — Patient Instructions (Signed)
[facility] Discharge Instructions for Patients Receiving Chemotherapy  Today you received the following chemotherapy agents Taxol   To help prevent nausea and vomiting after your treatment, we encourage you to take your nausea medication as directed.   If you develop nausea and vomiting that is not controlled by your nausea medication, call the clinic.   BELOW ARE SYMPTOMS THAT SHOULD BE REPORTED IMMEDIATELY:  *FEVER GREATER THAN 100.5 F  *CHILLS WITH OR WITHOUT FEVER  NAUSEA AND VOMITING THAT IS NOT CONTROLLED WITH YOUR NAUSEA MEDICATION  *UNUSUAL SHORTNESS OF BREATH  *UNUSUAL BRUISING OR BLEEDING  TENDERNESS IN MOUTH AND THROAT WITH OR WITHOUT PRESENCE OF ULCERS  *URINARY PROBLEMS  *BOWEL PROBLEMS  UNUSUAL RASH Items with * indicate a potential emergency and should be followed up as soon as possible.  Feel free to call the clinic you have any questions or concerns. The clinic phone number is [phone].  Please show the CHEMO ALERT CARD at check-in to the Emergency Department and triage nurse.   

## 2017-01-22 NOTE — Progress Notes (Signed)
Patient reports significant nausea and diarrhea with last treatment. Contacted Dr. Alvy Bimler regarding this. We will give her 8 mg of IV Zofran with her treatment today. We will send a prescription to her pharmacy for compazine. She was also reminded that she could take her ativan for nausea. Dr. Alvy Bimler will add aloxi to her future treatment dates. The patient will take immodium if she experiences diarrhea and if it is not controlled, she is instructed to call us. Patient verbalizes understanding.

## 2017-01-22 NOTE — Telephone Encounter (Signed)
-----   Message from Heath Lark, MD sent at 01/22/2017  1:25 PM EDT ----- Regarding: low potassium Recommend potassium high diet ----- Message ----- From: Interface, Lab In Three Zero One Sent: 01/22/2017  12:51 PM To: Heath Lark, MD

## 2017-01-22 NOTE — Telephone Encounter (Signed)
Notified of message below. List of high potassium foods given to patient

## 2017-01-25 ENCOUNTER — Telehealth: Payer: Self-pay | Admitting: *Deleted

## 2017-01-25 ENCOUNTER — Emergency Department (HOSPITAL_COMMUNITY)
Admission: EM | Admit: 2017-01-25 | Discharge: 2017-01-25 | Disposition: A | Discharge status: Home / Self Care | Payer: Medicare HMO | Attending: Emergency Medicine | Admitting: Emergency Medicine

## 2017-01-25 ENCOUNTER — Encounter (HOSPITAL_COMMUNITY): Payer: Self-pay

## 2017-01-25 ENCOUNTER — Other Ambulatory Visit: Payer: Self-pay | Admitting: *Deleted

## 2017-01-25 ENCOUNTER — Telehealth: Payer: Self-pay | Admitting: Hematology and Oncology

## 2017-01-25 ENCOUNTER — Other Ambulatory Visit: Payer: Self-pay | Admitting: Hematology and Oncology

## 2017-01-25 DIAGNOSIS — N183 Chronic kidney disease, stage 3 (moderate): Secondary | ICD-10-CM | POA: Diagnosis not present

## 2017-01-25 DIAGNOSIS — J449 Chronic obstructive pulmonary disease, unspecified: Secondary | ICD-10-CM | POA: Insufficient documentation

## 2017-01-25 DIAGNOSIS — F17210 Nicotine dependence, cigarettes, uncomplicated: Secondary | ICD-10-CM | POA: Diagnosis not present

## 2017-01-25 DIAGNOSIS — R197 Diarrhea, unspecified: Secondary | ICD-10-CM

## 2017-01-25 DIAGNOSIS — E876 Hypokalemia: Secondary | ICD-10-CM | POA: Diagnosis not present

## 2017-01-25 DIAGNOSIS — I129 Hypertensive chronic kidney disease with stage 1 through stage 4 chronic kidney disease, or unspecified chronic kidney disease: Secondary | ICD-10-CM | POA: Insufficient documentation

## 2017-01-25 DIAGNOSIS — R110 Nausea: Secondary | ICD-10-CM | POA: Diagnosis not present

## 2017-01-25 DIAGNOSIS — I2510 Atherosclerotic heart disease of native coronary artery without angina pectoris: Secondary | ICD-10-CM | POA: Insufficient documentation

## 2017-01-25 DIAGNOSIS — Z7982 Long term (current) use of aspirin: Secondary | ICD-10-CM | POA: Diagnosis not present

## 2017-01-25 LAB — COMPREHENSIVE METABOLIC PANEL
ALK PHOS: 90 U/L (ref 38–126)
ALT: 7 U/L — ABNORMAL LOW (ref 14–54)
AST: 11 U/L — ABNORMAL LOW (ref 15–41)
Albumin: 3 g/dL — ABNORMAL LOW (ref 3.5–5.0)
Anion gap: 8 (ref 5–15)
BUN: 12 mg/dL (ref 6–20)
CALCIUM: 8.3 mg/dL — AB (ref 8.9–10.3)
CO2: 24 mmol/L (ref 22–32)
CREATININE: 0.84 mg/dL (ref 0.44–1.00)
Chloride: 107 mmol/L (ref 101–111)
GFR calc Af Amer: >60 mL/min (ref >60)
GLUCOSE: 107 mg/dL — AB (ref 65–99)
Potassium: 3.2 mmol/L — ABNORMAL LOW (ref 3.5–5.1)
Sodium: 139 mmol/L (ref 135–145)
TOTAL PROTEIN: 5.9 g/dL — AB (ref 6.5–8.1)
Total Bilirubin: 0.6 mg/dL (ref 0.3–1.2)

## 2017-01-25 LAB — URINALYSIS, ROUTINE W REFLEX MICROSCOPIC
BILIRUBIN URINE: NEGATIVE
Glucose, UA: NEGATIVE mg/dL
Hgb urine dipstick: NEGATIVE
KETONES UR: NEGATIVE mg/dL
Leukocytes, UA: NEGATIVE
NITRITE: NEGATIVE
PH: 6 (ref 5.0–8.0)
PROTEIN: NEGATIVE mg/dL
Specific Gravity, Urine: 1.008 (ref 1.005–1.030)

## 2017-01-25 LAB — CBC
HEMATOCRIT: 32 % — AB (ref 36.0–46.0)
Hemoglobin: 9.9 g/dL — ABNORMAL LOW (ref 12.0–15.0)
MCH: 26.1 pg (ref 26.0–34.0)
MCHC: 30.9 g/dL (ref 30.0–36.0)
MCV: 84.4 fL (ref 78.0–100.0)
PLATELETS: 123 10*3/uL — AB (ref 150–400)
RBC: 3.79 MIL/uL — ABNORMAL LOW (ref 3.87–5.11)
RDW: 20.7 % — AB (ref 11.5–15.5)
WBC: 2.8 10*3/uL — AB (ref 4.0–10.5)

## 2017-01-25 LAB — LIPASE, BLOOD: Lipase: 17 U/L (ref 11–51)

## 2017-01-25 MED ORDER — SODIUM CHLORIDE 0.9 % IV BOLUS (SEPSIS)
1000.0000 mL | Freq: Once | INTRAVENOUS | Status: AC
  Administered 2017-01-25: 1000 mL via INTRAVENOUS

## 2017-01-25 MED ORDER — HEPARIN SOD (PORK) LOCK FLUSH 100 UNIT/ML IV SOLN
500.0000 [IU] | Freq: Once | INTRAVENOUS | Status: AC
Start: 2017-01-25 — End: 2017-01-25
  Administered 2017-01-25: 500 [IU]
  Filled 2017-01-25: qty 5

## 2017-01-25 MED ORDER — POTASSIUM CHLORIDE CRYS ER 20 MEQ PO TBCR
20.0000 meq | EXTENDED_RELEASE_TABLET | Freq: Once | ORAL | Status: AC
  Administered 2017-01-25: 20 meq via ORAL
  Filled 2017-01-25: qty 1

## 2017-01-25 MED ORDER — POTASSIUM CHLORIDE 10 MEQ/100ML IV SOLN
10.0000 meq | Freq: Once | INTRAVENOUS | Status: DC
  Filled 2017-01-25: qty 100

## 2017-01-25 NOTE — ED Triage Notes (Signed)
Patient c/o intermittent diarrhea x 2 weeks. Patient is currently taking chemo and las received 3 days ago. Patient denies any fever.

## 2017-01-25 NOTE — ED Notes (Signed)
Bed: CV81 Expected date:  Expected time:  Means of arrival:  Comments: HOLD

## 2017-01-25 NOTE — ED Provider Notes (Signed)
[facility] DEPT Provider Note   CSN: [id number] Arrival date & time: 01/25/17  1554     History   Chief Complaint Chief Complaint  Patient presents with  . Diarrhea    HPI Lauren [name] is a 66 y.o. female.  The history is provided by the patient. No language interpreter was used.  Diarrhea   This is a new problem. The current episode started more than 1 week ago. The problem has not changed since onset.The stool consistency is described as watery. There has been no fever. She has tried nothing for the symptoms. The treatment provided no relief.  Pt is followed by Oncology.  Pt reports she has had diarrhea since changing to a new chemotherapy.    Past Medical History:  Diagnosis Date  . Abnormality of gait 09/10/2015  . Acute on chronic respiratory failure with hypoxemia ([place]) 07/11/2015  . Anemia due to chemotherapy 08/29/2014  . 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 Angina pectoris ([place]) 03/05/2016  . Arthritis   . ASCUS with positive high risk HPV 04/21/2012   colpo clinically normal 04/2012.  Given her other significant co morbidities (recurrent lung cancer, brain mets and colon cancer requiring chronic chemo) I don't think I would be very aggressive in pursuing any more paps. If she felt necessary, could do a repeat in one year, but the likelihood is that this is the least of her problems and I doubt she will benefit from repeated paps and colpos and I sincerely doubt anything we find would change the course of her life. We discussed. She has already decided to consider stopping chemo in May because after 5 years she is quite tired of it. I would always be willing to do a pap, but my recommendation is to stop.   . Back pain   . Benign paroxysmal positional vertigo 08/16/2015  . Bilateral hearing loss, Mild 02/27/2016   03/23/16 Hearing loss Evaluation by Unice Bailey, MD, [facility], Dx with mild bilateral hearing loss except normal hearing in  right ear of 500 Hz and 1000 Hz.  Dr Redmond Baseman recommends annual hearing evaluations.   . Bilateral leg edema 10/17/2014  . Bilateral pleural effusion 08/09/2013  . Blepharitis of both eyes 11/09/2014  . Blurred vision, bilateral 06/06/2014  . Brain metastases ([place]) 02/15/12  . Cerebral aneurysm   . CEREBRAL ANEURYSM 12/29/2006   Qualifier: Diagnosis of  By: Girard Cooter MD, MAKEECHA    . Chronic dermatitis of hands 02/27/2016  . Chronic folliculitis    of groin  . Colon cancer ([place]) 11/08  . COPD (chronic obstructive pulmonary disease) ([place])   . 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 Coronary artery disease   . Coronary atherosclerosis 09/09/2007   Qualifier: Diagnosis of  By: Martinique, Bonnie    . Depression   . Depressive disorder   . DEPRESSIVE DISORDER, MAJOR, RCR, MILD 12/29/2006   Qualifier: Diagnosis of  By: Girard Cooter MD, MAKEECHA    . Dyspnea 07/15/2015  . Encounter for antineoplastic chemotherapy 09/11/2015  . Encounter for therapeutic drug monitoring 09/11/2015  . Essential hypertension 12/29/2006   Echocardiogram (07/26/13): Left ventricle: The cavity size was normal. Wall thickness was increased in a pattern of mild LVH. Systolic function was normal. The estimated ejection fraction was in the range of 55% to 60%. Wall motion was normal; there were no regional wall motion abnormalities. Normal mitral valve.    . Falls frequently 08/02/2015  . Family history of Huntington's  disease   . Family history of trichomonal vaginitis 05/2005  . Fatigue 02/06/2013  . 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 Fibrocystic breast changes   . GERD 12/29/2006   Qualifier: Diagnosis of  By: Girard Cooter MD, MAKEECHA    . GERD (gastroesophageal reflux disease)   . History of colon cancer 05/31/2013  . Hypercholesterolemia   . HYPERCHOLESTEROLEMIA 12/29/2006   Qualifier: Diagnosis of  By: Girard Cooter MD, MAKEECHA    . Hyperlipidemia   . Hypertension   . Hypokalemia 08/09/2013  . Insomnia 10/05/2011  .  Left-sided low back pain with left-sided sciatica 07/18/2014  . Low back pain with sciatica 06/22/2014  . Lung cancer ([place]) 06/16/10   PET scan 04/28/2010; primary: increase in size 02/2010 / Well Differentiated Adenocarcinoma of the lung   . Lung nodule    FNA ordered for 04/02/10 by HA>pos Ca  . LVH (left ventricular hypertrophy) 12/20/2014   Echocardiogram (07/26/13): Left ventricle: Wall thickness was increased in a pattern of mild LVH. The cavity size was normal. Systolic function was normal. The estimated ejection fraction was in the range of 55% to 60%. Wall motion was normal; there were no regional wall motion abnormalities. Normal mitral valve.   . On antineoplastic chemotherapy started 02/2012   [place]  . Other fatigue 11/28/2014  . Pleural effusion 05/03/2013  . 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 Postmenopausal   . POSTMENOPAUSAL SYNDROME 01/31/2009   Qualifier: Diagnosis of  By: Carlena Sax  MD, [place]    . Postural dizziness 06/19/2015  . Protein-calorie malnutrition, moderate ([place]) 09/11/2015  . S/P radiation therapy 03/01/12   SRS: 1 fraction / 20 Gray each to the Left Occipital Region and to the Right Insular Metastases  . Sensorineural hearing loss (SNHL) of both ears 04/23/2016   Dr Keturah Barre. Redmond Baseman (ENT) dx significant bilateral hearing loss and recommended biaural hearing aids.   . Shortness of breath   . Skin rash 10/03/2015  . Status post chemotherapy comp. 12/29/11   Carboplatin/Taxol  . Status post radiation therapy 11/16/11 - 12/29/11   Right Lung and Mediastinum: 60 Gy  . Status post stereotactic radiosurgery 01/2012   for Brain Metastases  . Tobacco dependence   . Vitamin D deficiency 08/30/2015  . Weight loss 10/22/2011    Patient Active Problem List   Diagnosis Date Noted  . Persistent dry cough 01/08/2017  . Chronic right-sided back pain 01/08/2017  . Generalized anxiety disorder 01/01/2017  . Leukocytosis 08/21/2016  . Stress response 08/13/2016   . Cancer associated pain 05/22/2016  . Pancytopenia, acquired ([place]) 05/01/2016  . Chronic kidney disease, stage III (moderate) 05/01/2016  . Sensorineural hearing loss (SNHL) of both ears 04/23/2016  . Osteoarthritis of right knee 04/23/2016  . Balance problem 04/23/2016  . Impingement syndrome of left shoulder 04/23/2016  . Goals of care, counseling/discussion 04/16/2016  . Chronic dermatitis of hands 02/27/2016  . Abnormal mammogram of right breast 11/01/2015  . Encounter for chemotherapy management 09/11/2015  . Encounter for therapeutic drug monitoring 09/11/2015  . Encounter for antineoplastic chemotherapy 09/11/2015  . Vitamin D deficiency 08/30/2015  . Mild cognitive impairment 08/16/2015  . Falls frequently 08/02/2015  . COPD mixed type ([place]) 07/15/2015  . Protein calorie malnutrition ([place]) 03/14/2015  . LVH (left ventricular hypertrophy) 12/20/2014  . Anemia due to chemotherapy 08/29/2014  . Neuropathy due to chemotherapeutic drug ([place]) 07/19/2014  . History of colon cancer 05/31/2013  . Brain metastases treated with radiosurgery 02/26/2012  .  Cancer of upper lobe of left lung, Adenocarcinoma   . TOBACCO ABUSE 10/04/2007  . Coronary atherosclerosis 09/09/2007  . HYPERCHOLESTEROLEMIA 12/29/2006  . DEPRESSIVE DISORDER, MAJOR, RCR, MILD 12/29/2006  . Essential hypertension 12/29/2006  . CEREBRAL ANEURYSM 12/29/2006  . GERD 12/29/2006    Past Surgical History:  Procedure Laterality Date  . BACK SURGERY     Dr Luiz Ochoa  . BREAST SURGERY     Bil lumpectomy  . cardiac cath x3    . CHEST TUBE INSERTION Right 06/12/2013   Procedure: INSERTION PLEURAL DRAINAGE CATHETER;  Surgeon: Gaye Pollack, MD;  Location: [facility];  Service: Thoracic;  Laterality: Right;  . COLECTOMY  03/22/07   Stage 1 pT2 N0, M0 Adenocarcinoma of the sigmoid  colon  . HERNIA REPAIR    . LUNG LOBECTOMY  06/16/10   Left Upper Lobectomy  . MEDIASTINOSCOPY  10/19/2011   Procedure: MEDIASTINOSCOPY;  Surgeon:  Gaye Pollack, MD;  Location: [facility];  Service: Thoracic;  Laterality: N/A;  . TALC PLEURODESIS Right 06/12/2013   Procedure: Pietro Cassis;  Surgeon: Gaye Pollack, MD;  Location: [facility];  Service: Thoracic;  Laterality: Right;  . TUBAL LIGATION    . TUNNELED VENOUS CATHETER PLACEMENT     Port-a-Cath    OB History    No data available       Home Medications    Prior to Admission medications   Medication Sig Start Date End Date Taking? Authorizing Provider  aspirin EC 81 MG tablet Take 81 mg by mouth daily.   Yes [provider]  BYSTOLIC 10 MG tablet Take 10 mg by mouth daily.  06/16/16  Yes [provider]  diclofenac sodium (VOLTAREN) 1 % GEL Apply 2 g topically 4 (four) times daily as needed. Patient taking differently: Apply 2 g topically 4 (four) times daily as needed (pain).  10/06/16  Yes Gorsuch, Ni, MD  HYDROmorphone (DILAUDID) 8 MG tablet Take 1 tablet (8 mg total) by mouth every 6 (six) hours as needed for severe pain. 08/21/16  Yes Gorsuch, Ni, MD  ipratropium (ATROVENT HFA) 17 MCG/ACT inhaler Inhale 2 puffs into the lungs every 4 (four) hours as needed for wheezing (Cough). 09/09/16  Yes Noralee Space, MD  lidocaine-prilocaine (EMLA) cream APPLY TOPICALLY AS NEEDED TO PORTA CATH SITE 1 HOUR PRIOR TO NEEDLE STICK. 10/06/16  Yes Gorsuch, Ni, MD  loperamide (IMODIUM A-D) 2 MG tablet Take 2 mg by mouth 4 (four) times daily as needed for diarrhea or loose stools.   Yes [provider]  LORazepam (ATIVAN) 0.5 MG tablet Take 1 tablet (0.5 mg total) by mouth every 8 (eight) hours as needed for anxiety. 01/01/17  Yes Gorsuch, Ni, MD  mirtazapine (REMERON) 15 MG tablet Take 1 tablet (15 mg total) by mouth at bedtime. 01/01/17  Yes Gorsuch, Ni, MD  omeprazole (PRILOSEC) 40 MG capsule Take 40 mg by mouth daily.  06/16/16  Yes [provider]  pravastatin (PRAVACHOL) 40 MG tablet Take 40 mg by mouth daily.  06/16/16  Yes [provider]    umeclidinium-vilanterol (ANORO ELLIPTA) 62.5-25 MCG/INH AEPB Inhale 1 puff into the lungs daily. 09/09/16  Yes Noralee Space, MD  VENTOLIN HFA 108 [phone] Base) MCG/ACT inhaler Inhale 2 puffs into the lungs every 4 (four) hours as needed for wheezing or shortness of breath. 09/09/16  Yes Noralee Space, MD  loratadine (CLARITIN) 10 MG tablet Take 10 mg by mouth daily.    [provider]  nitroGLYCERIN (NITROSTAT) 0.4 MG SL tablet Place 1 tablet (0.4 mg total) under the tongue every 5 (five) minutes as needed. For chest pain 12/20/14   McDiarmid, Blane Ohara, MD  ondansetron (ZOFRAN ODT) 4 MG disintegrating tablet Take 1 tablet (4 mg total) by mouth every 8 (eight) hours as needed for nausea or vomiting. Patient not taking: Reported on 01/25/2017 04/23/16   McDiarmid, Blane Ohara, MD  prochlorperazine (COMPAZINE) 10 MG tablet Take 1 tablet (10 mg total) by mouth every 6 (six) hours as needed for nausea or vomiting. 01/22/17   Heath Lark, MD    Family History Family History  Problem Relation Age of Onset  . Stomach cancer Maternal Aunt   . Osteoarthritis Father   . Gout Father   . Hypertension Father   . Heart disease Mother        pericarditis;   . Breast cancer Cousin   . Cancer Sister        Lymphatic  . Huntington's disease Son   . Huntington's disease Son   . Anesthesia problems Neg Hx     Social History Social History  Substance Use Topics  . Smoking status: Current Every Day Smoker    Packs/day: 0.75    Types: Cigarettes    Start date: 05/18/1965  . Smokeless tobacco: Never Used  . Alcohol use No     Allergies   Adhesive [tape] and Lisinopril   Review of Systems Review of Systems  Gastrointestinal: Positive for diarrhea.  All other systems reviewed and are negative.    Physical Exam Updated Vital Signs BP (!) 147/69 (BP Location: Left Arm)   Pulse 85   Temp 99.1 F (37.3 C) (Oral)   Resp (!) 26   Ht 5\' 4"  (1.626 m)   Wt 67.6 kg (149 lb)   SpO2 96%   BMI 25.58  kg/m   Physical Exam  Constitutional: She appears well-developed and well-nourished. No distress.  HENT:  Head: Normocephalic and atraumatic.  Right Ear: External ear normal.  Left Ear: External ear normal.  Nose: Nose normal.  Mouth/Throat: Oropharynx is clear and moist.  Eyes: Pupils are equal, round, and reactive to light. Conjunctivae and EOM are normal.  Neck: Neck supple.  Cardiovascular: Normal rate and regular rhythm.   No murmur heard. Pulmonary/Chest: Effort normal and breath sounds normal. No respiratory distress.  Abdominal: Soft. There is no tenderness.  Musculoskeletal: She exhibits no edema.  Neurological: She is alert.  Skin: Skin is warm and dry.  Psychiatric: She has a normal mood and affect.  Nursing note and vitals reviewed.    ED Treatments / Results  Labs (all labs ordered are listed, but only abnormal results are displayed) Labs Reviewed  COMPREHENSIVE METABOLIC PANEL - Abnormal; Notable for the following:       Result Value   Potassium 3.2 (*)    Glucose, Bld 107 (*)    Calcium 8.3 (*)    Total Protein 5.9 (*)    Albumin 3.0 (*)    AST 11 (*)    ALT 7 (*)    All other components within normal limits  CBC - Abnormal; Notable for the following:    WBC 2.8 (*)    RBC 3.79 (*)    Hemoglobin 9.9 (*)    HCT 32.0 (*)    RDW 20.7 (*)    Platelets 123 (*)    All other components within normal limits  LIPASE, BLOOD  URINALYSIS, ROUTINE W REFLEX MICROSCOPIC  EKG  EKG Interpretation None       Radiology No results found.  Procedures Procedures (including critical care time)  Medications Ordered in ED Medications  potassium chloride 10 mEq in 100 mL IVPB (not administered)  sodium chloride 0.9 % bolus 1,000 mL (0 mLs Intravenous Stopped 01/25/17 1850)  potassium chloride SA (K-DUR,KLOR-CON) CR tablet 20 mEq (20 mEq Oral Given 01/25/17 2154)     Initial Impression / Assessment and Plan / ED Course  I have reviewed the triage vital  signs and the nursing notes.  Pertinent labs & imaging results that were available during my care of the patient were reviewed by me and considered in my medical decision making (see chart for details).     Pt given Iv fluids x 1 liter,  Potassium Iv and potassium po.  Pt reports she feels better after fluids and wants to go home.  Pt tolerating po fluids.   Final Clinical Impressions(s) / ED Diagnoses   Final diagnoses:  Diarrhea, unspecified type  Hypokalemia  Nausea    New Prescriptions New Prescriptions   No medications on file  An After Visit Summary was printed and given to the patient.    Fransico Meadow, PA-C 01/25/17 2201    Fransico Meadow, PA-C 01/25/17 2201    Fatima Blank, MD 01/26/17 [phone]

## 2017-01-25 NOTE — Telephone Encounter (Signed)
sw pt to inform of [facility] appt 9/11 at 1030 per sch msg. Pt stated she is going to to urgent care;could not waitl til the morning

## 2017-01-25 NOTE — Telephone Encounter (Signed)
Late entry - this RN spoke with pt at 245 pm per her call stating she has developed diarrhea since having chemo last week.  She states 6-7 watery stools a day except today she has had only 1 stool due to " just not eating " .  Pt states she is able to drink well " but if I put anything in my mouth I get diarrhea "  Per discussion - she states she was using imodium " but then it stopped working "  This RN discussed need to come in to symptom management with [place] stating she has to arrange a ride- " I usually use [facility] "  This RN offered pt to come in 9/11 to symptom management with pt stating she should be able to arrange transportation.  Pt verbalized understanding  that if she continues to worsen she should proceed to the ER this evening.  LOS sent to scheduling.

## 2017-01-25 NOTE — ED Notes (Signed)
Pt reports diarrhea x 2 weeks with dizziness when standing up.  Per pt's chart, it has been documented that she has been in contact with the cancer ctr re her diarrhea.  Pt reports it's d/t the chemo.  Pt also reports that she was informed in the past that she was hypoK+.  Pt is A&Ox 4.

## 2017-01-25 NOTE — Patient Outreach (Signed)
Lauren [name]) Care Management  01/25/2017  Lauren [name] 1950-09-25 [id number]   CSW received call back from patient who informed CSW that she has someone from [place], from [facility] on 9/27 at 10:00am that is scheduled to meet with her. Patient reports that the new treatment she is on has been causing pretty significant nausea and diarrhea to the point where she does not feel comfortable leaving her home for the fear of having an accident. Patient states that she is planning to discuss this with Dr. Alvy Bimler at her next appointment on 9/24.   CSW provided supportive listening with patients concerns about ending treatment and how her sister and daughter would react. Patient states that she read a book that she picked up at the cancer center called "Dying to Know" and it has helped her process her emotions and thoughts towards end of life.   CSW will check back with patient in a month to provide supportive listening and assist patient with further resources.    Lauren Opitz, LCSW [facility]  Clinical Social Worker cell #: [phone]

## 2017-01-25 NOTE — Discharge Instructions (Signed)
Return if any problems.

## 2017-01-26 ENCOUNTER — Other Ambulatory Visit: Payer: Self-pay

## 2017-01-26 ENCOUNTER — Telehealth: Payer: Self-pay | Admitting: *Deleted

## 2017-01-26 ENCOUNTER — Encounter: Payer: Self-pay | Admitting: Medical

## 2017-01-26 MED ORDER — DIPHENOXYLATE-ATROPINE 2.5-0.025 MG PO TABS: 1.0000 | ORAL_TABLET | Freq: Four times a day (QID) | ORAL | 0 refills | Status: DC | PRN

## 2017-01-26 MED FILL — DIPHENOXYLATE/ATROPINE TAB: 2.5-0.025 | 8 days supply | Qty: 30 | Fill #0

## 2017-01-26 NOTE — Telephone Encounter (Signed)
Spoke with Minette Brine. Continues to have diarrhea. Will call in lomotil. States she went to ED last night and had IVF. Discussed BRAT diet and use of nausea pills. To call us if she feels she needs extra IVF this week

## 2017-01-27 ENCOUNTER — Other Ambulatory Visit: Payer: Self-pay | Admitting: *Deleted

## 2017-01-27 NOTE — Patient Outreach (Signed)
[facility]) Care Management  01/27/2017  Lauren [name] 10/16/1950 [id number]   RN Health Coach attempted follow up outreach call to patient.  Patient was unavailable. HIPPA compliance voicemail message left with return callback number.  Plan: RN will call patient again within 14 days.  [facility] Care Management [phone]

## 2017-01-27 NOTE — Patient Outreach (Signed)
[facility]) Care Management  01/27/2017   Lauren [name] 10-Feb-1951 [id number]  RN Health Coach telephone call to patient.  [employer] compliance verified. Per patient she is having increase shortness of breath and coughing. Per patient she is feeling very weak. Patient has a decrease in appetite. Patient is having liquid stools but the lomotil is helping better than the imodium. RN discussed with patient about using her inhalers. Patient has not been using the inhalers as she should. Patient is very anxious about the symptoms she is having and the chemotherapy. RN supported patient allowing her to express feelings. Patient has agreed to follow up outreach calls.   Current Medications:  Current Outpatient Prescriptions  Medication Sig Dispense Refill  . aspirin EC 81 MG tablet Take 81 mg by mouth daily.    Marland Kitchen BYSTOLIC 10 MG tablet Take 10 mg by mouth daily.     . diclofenac sodium (VOLTAREN) 1 % GEL Apply 2 g topically 4 (four) times daily as needed. (Patient taking differently: Apply 2 g topically 4 (four) times daily as needed (pain). ) 100 g 1  . diphenoxylate-atropine (LOMOTIL) 2.5-0.025 MG tablet Take 1 tablet by mouth 4 (four) times daily as needed for diarrhea or loose stools. 30 tablet 0  . HYDROmorphone (DILAUDID) 8 MG tablet Take 1 tablet (8 mg total) by mouth every 6 (six) hours as needed for severe pain. 60 tablet 0  . ipratropium (ATROVENT HFA) 17 MCG/ACT inhaler Inhale 2 puffs into the lungs every 4 (four) hours as needed for wheezing (Cough). 3 Inhaler 3  . lidocaine-prilocaine (EMLA) cream APPLY TOPICALLY AS NEEDED TO PORTA CATH SITE 1 HOUR PRIOR TO NEEDLE STICK. 30 g 0  . LORazepam (ATIVAN) 0.5 MG tablet Take 1 tablet (0.5 mg total) by mouth every 8 (eight) hours as needed for anxiety. 60 tablet 1  . mirtazapine (REMERON) 15 MG tablet Take 1 tablet (15 mg total) by mouth at bedtime. 30 tablet 11  . omeprazole (PRILOSEC) 40 MG capsule Take 40 mg by mouth daily.      . ondansetron (ZOFRAN ODT) 4 MG disintegrating tablet Take 1 tablet (4 mg total) by mouth every 8 (eight) hours as needed for nausea or vomiting. 20 tablet 0  . pravastatin (PRAVACHOL) 40 MG tablet Take 40 mg by mouth daily.     Marland Kitchen umeclidinium-vilanterol (ANORO ELLIPTA) 62.5-25 MCG/INH AEPB Inhale 1 puff into the lungs daily. 3 each 3  . VENTOLIN HFA 108 (90 Base) MCG/ACT inhaler Inhale 2 puffs into the lungs every 4 (four) hours as needed for wheezing or shortness of breath. 3 Inhaler 3  . loperamide (IMODIUM A-D) 2 MG tablet Take 2 mg by mouth 4 (four) times daily as needed for diarrhea or loose stools.    Marland Kitchen loratadine (CLARITIN) 10 MG tablet Take 10 mg by mouth daily.    . nitroGLYCERIN (NITROSTAT) 0.4 MG SL tablet Place 1 tablet (0.4 mg total) under the tongue every 5 (five) minutes as needed. For chest pain (Patient not taking: Reported on 01/27/2017) 25 tablet 5  . prochlorperazine (COMPAZINE) 10 MG tablet Take 1 tablet (10 mg total) by mouth every 6 (six) hours as needed for nausea or vomiting. (Patient not taking: Reported on 01/27/2017) 30 tablet 1   No current facility-administered medications for this visit.    Facility-Administered Medications Ordered in Other Visits  Medication Dose Route Frequency Provider Last Rate Last Dose  . sodium chloride 0.9 % injection 10 mL  10 mL Intravenous  PRN Heath Lark, MD   10 mL at 09/18/15 1345  . sodium chloride 0.9 % injection 10 mL  10 mL Intravenous PRN Alvy Bimler, Ni, MD   10 mL at 04/24/16 1136  . sodium chloride 0.9 % injection 10 mL  10 mL Intravenous PRN Heath Lark, MD   10 mL at 01/15/17 1322    Functional Status:  In your present state of health, do you have any difficulty performing the following activities: 01/27/2017 01/01/2017  Hearing? N N  Vision? Y Y  Difficulty concentrating or making decisions? [name]  Walking or climbing stairs? Y Y  Dressing or bathing? N N  Doing errands, shopping? [name]  Preparing Food and eating ? N -   Using the Toilet? N -  In the past six months, have you accidently leaked urine? Y -  Do you have problems with loss of bowel control? N -  Managing your Medications? N -  Managing your Finances? N -  Housekeeping or managing your Housekeeping? Y -  Some recent data might be hidden    Fall/Depression Screening: Fall Risk  01/27/2017 01/01/2017 12/31/2016  Falls in the past year? Yes Yes Yes  Number falls in past yr: _0 Comment - - -  Injury with Fall? No No No  Comment - - -  Risk Factor Category  - High Fall Risk High Fall Risk  Risk for fall due to : Impaired balance/gait;Impaired mobility;History of fall(s) History of fall(s);Impaired balance/gait;Impaired mobility Impaired mobility;Impaired balance/gait;History of fall(s)  Risk for fall due to: Comment - - -  Follow up Falls evaluation completed;Education provided;Falls prevention discussed Falls evaluation completed;Falls prevention discussed;Education provided Falls evaluation completed;Education provided;Falls prevention discussed   PHQ 2/9 Scores 01/27/2017 01/01/2017 12/31/2016 12/10/2016 12/02/2016 11/27/2016 11/11/2016  PHQ - 2 Score _1 0 0 0 0  PHQ- 9 Score - - - - - - -  Exception Documentation - - - - - - -   THN CM Care Plan Problem One     Most Recent Value  Care Plan Problem One  Knowledge deficit in self management of COPD  Role Documenting the Problem One  [facility] [place] for Problem One  Active  THN Long Term Goal   Patient will not have any readmissions for COPD within the next 90 days  THN Long Term Goal Start Date  01/27/17  Interventions for Problem One Long Term Goal  RN reminded patient to keep appointments with PCP and oncology and pulmonary specialist. RN reminded patient to take medications as per order.  THN CM Short Term Goal #2   Patient will discuss with physician about increased anxiety within the next 30 days  THN CM Short Term Goal #2 Start Date  01/27/17  [facility] CM Short Term Goal #2 Met  Date  01/27/17  Interventions for Short Term Goal #2  RN discussed with patient about the anxiety she is experiencing. RN made patient aware that she needs to make her physician aware with her visit on [phone]. RN wll follow up with patient for discussion   THN CM Short Term Goal #3  Patient will verbalize taking medication as per ordered within the next 30 days  THN CM Short Term Goal #3 Start Date  01/27/17  Interventions for Short Tern Goal #3  RN discussed the importance of taking medications as per ordered. Rn went over list of medications with patient . RN will follow up will further discussion  Assessment: Patient is having diarrhea stools Patient has poor intake Patient is not using inhalers as per ordered   Plan:  RN discussed usage of inhalers RN discussed medication adherence RN sent patient adult briefs for incontinence RN discussed increase fluid and soft food intake RN will follow up within the month of October  [facility] Care Management [phone]

## 2017-01-28 ENCOUNTER — Ambulatory Visit: Payer: Self-pay | Admitting: *Deleted

## 2017-02-05 ENCOUNTER — Ambulatory Visit: Payer: Self-pay | Admitting: Oncology

## 2017-02-05 ENCOUNTER — Ambulatory Visit: Payer: Self-pay

## 2017-02-05 ENCOUNTER — Other Ambulatory Visit: Payer: Self-pay

## 2017-02-08 ENCOUNTER — Ambulatory Visit (HOSPITAL_BASED_OUTPATIENT_CLINIC_OR_DEPARTMENT_OTHER): Payer: Medicare HMO | Admitting: Hematology and Oncology

## 2017-02-08 ENCOUNTER — Ambulatory Visit: Payer: Medicare HMO

## 2017-02-08 ENCOUNTER — Other Ambulatory Visit (HOSPITAL_BASED_OUTPATIENT_CLINIC_OR_DEPARTMENT_OTHER): Payer: Medicare HMO

## 2017-02-08 ENCOUNTER — Ambulatory Visit (HOSPITAL_BASED_OUTPATIENT_CLINIC_OR_DEPARTMENT_OTHER): Payer: Medicare HMO

## 2017-02-08 ENCOUNTER — Telehealth: Payer: Self-pay | Admitting: Hematology and Oncology

## 2017-02-08 VITALS — BP 150/72 | HR 84 | Temp 98.0°F | Resp 18 | Ht 64.0 in | Wt 151.8 lb

## 2017-02-08 DIAGNOSIS — T451X5A Adverse effect of antineoplastic and immunosuppressive drugs, initial encounter: Secondary | ICD-10-CM

## 2017-02-08 DIAGNOSIS — C3412 Malignant neoplasm of upper lobe, left bronchus or lung: Secondary | ICD-10-CM | POA: Diagnosis not present

## 2017-02-08 DIAGNOSIS — Z5111 Encounter for antineoplastic chemotherapy: Secondary | ICD-10-CM | POA: Diagnosis not present

## 2017-02-08 DIAGNOSIS — G620 Drug-induced polyneuropathy: Secondary | ICD-10-CM | POA: Diagnosis not present

## 2017-02-08 DIAGNOSIS — C7931 Secondary malignant neoplasm of brain: Secondary | ICD-10-CM

## 2017-02-08 DIAGNOSIS — F17200 Nicotine dependence, unspecified, uncomplicated: Secondary | ICD-10-CM

## 2017-02-08 DIAGNOSIS — C7951 Secondary malignant neoplasm of bone: Secondary | ICD-10-CM | POA: Diagnosis not present

## 2017-02-08 DIAGNOSIS — Z7189 Other specified counseling: Secondary | ICD-10-CM

## 2017-02-08 DIAGNOSIS — K521 Toxic gastroenteritis and colitis: Secondary | ICD-10-CM

## 2017-02-08 DIAGNOSIS — C7801 Secondary malignant neoplasm of right lung: Secondary | ICD-10-CM | POA: Diagnosis not present

## 2017-02-08 DIAGNOSIS — Z720 Tobacco use: Secondary | ICD-10-CM

## 2017-02-08 DIAGNOSIS — D61818 Other pancytopenia: Secondary | ICD-10-CM

## 2017-02-08 DIAGNOSIS — C3490 Malignant neoplasm of unspecified part of unspecified bronchus or lung: Secondary | ICD-10-CM

## 2017-02-08 DIAGNOSIS — R197 Diarrhea, unspecified: Secondary | ICD-10-CM

## 2017-02-08 DIAGNOSIS — C7949 Secondary malignant neoplasm of other parts of nervous system: Secondary | ICD-10-CM

## 2017-02-08 LAB — COMPREHENSIVE METABOLIC PANEL
ALBUMIN: 3 g/dL — AB (ref 3.5–5.0)
ALK PHOS: 108 U/L (ref 40–150)
ANION GAP: 7 meq/L (ref 3–11)
AST: 10 U/L (ref 5–34)
BUN: 9.9 mg/dL (ref 7.0–26.0)
CO2: 26 meq/L (ref 22–29)
CREATININE: 0.9 mg/dL (ref 0.6–1.1)
Calcium: 9.2 mg/dL (ref 8.4–10.4)
Chloride: 110 mEq/L — ABNORMAL HIGH (ref 98–109)
EGFR: 72 mL/min/{1.73_m2} — AB (ref >90)
Glucose: 87 mg/dl (ref 70–140)
Potassium: 3.7 mEq/L (ref 3.5–5.1)
Sodium: 143 mEq/L (ref 136–145)
TOTAL PROTEIN: 6.6 g/dL (ref 6.4–8.3)
Total Bilirubin: 0.42 mg/dL (ref 0.20–1.20)

## 2017-02-08 LAB — CBC WITH DIFFERENTIAL/PLATELET
BASO%: 0.5 % (ref 0.0–2.0)
Basophils Absolute: 0 10*3/uL (ref 0.0–0.1)
EOS ABS: 0.1 10*3/uL (ref 0.0–0.5)
EOS%: 2.4 % (ref 0.0–7.0)
HEMATOCRIT: 34.7 % — AB (ref 34.8–46.6)
HEMOGLOBIN: 10.4 g/dL — AB (ref 11.6–15.9)
LYMPH#: 0.8 10*3/uL — AB (ref 0.9–3.3)
LYMPH%: 21.7 % (ref 14.0–49.7)
MCH: 25.2 pg (ref 25.1–34.0)
MCHC: 30 g/dL — ABNORMAL LOW (ref 31.5–36.0)
MCV: 84 fL (ref 79.5–101.0)
MONO#: 0.4 10*3/uL (ref 0.1–0.9)
MONO%: 9.9 % (ref 0.0–14.0)
NEUT%: 65.5 % (ref 38.4–76.8)
NEUTROS ABS: 2.4 10*3/uL (ref 1.5–6.5)
PLATELETS: 204 10*3/uL (ref 145–400)
RBC: 4.13 10*6/uL (ref 3.70–5.45)
RDW: 20.4 % — ABNORMAL HIGH (ref 11.2–14.5)
WBC: 3.7 10*3/uL — AB (ref 3.9–10.3)

## 2017-02-08 MED ORDER — PALONOSETRON HCL INJECTION 0.25 MG/5ML
INTRAVENOUS | Status: AC
  Filled 2017-02-08: qty 5

## 2017-02-08 MED ORDER — FAMOTIDINE IN NACL 20-0.9 MG/50ML-% IV SOLN
20.0000 mg | Freq: Once | INTRAVENOUS | Status: AC
  Administered 2017-02-08: 20 mg via INTRAVENOUS

## 2017-02-08 MED ORDER — DIPHENHYDRAMINE HCL 50 MG/ML IJ SOLN
50.0000 mg | Freq: Once | INTRAMUSCULAR | Status: AC
  Administered 2017-02-08: 50 mg via INTRAVENOUS

## 2017-02-08 MED ORDER — SODIUM CHLORIDE 0.9% FLUSH
10.0000 mL | INTRAVENOUS | Status: DC | PRN
  Administered 2017-02-08: 10 mL
  Filled 2017-02-08: qty 10

## 2017-02-08 MED ORDER — FAMOTIDINE IN NACL 20-0.9 MG/50ML-% IV SOLN
INTRAVENOUS | Status: AC
  Filled 2017-02-08: qty 50

## 2017-02-08 MED ORDER — PALONOSETRON HCL INJECTION 0.25 MG/5ML
0.2500 mg | Freq: Once | INTRAVENOUS | Status: AC
  Administered 2017-02-08: 0.25 mg via INTRAVENOUS

## 2017-02-08 MED ORDER — DEXAMETHASONE SODIUM PHOSPHATE 10 MG/ML IJ SOLN
INTRAMUSCULAR | Status: AC
  Filled 2017-02-08: qty 1

## 2017-02-08 MED ORDER — HEPARIN SOD (PORK) LOCK FLUSH 100 UNIT/ML IV SOLN
500.0000 [IU] | Freq: Once | INTRAVENOUS | Status: AC | PRN
  Administered 2017-02-08: 500 [IU]
  Filled 2017-02-08: qty 5

## 2017-02-08 MED ORDER — DEXAMETHASONE SODIUM PHOSPHATE 10 MG/ML IJ SOLN
10.0000 mg | Freq: Once | INTRAMUSCULAR | Status: AC
  Administered 2017-02-08: 10 mg via INTRAVENOUS

## 2017-02-08 MED ORDER — PACLITAXEL CHEMO INJECTION 300 MG/50ML
64.0000 mg/m2 | Freq: Once | INTRAVENOUS | Status: AC
  Administered 2017-02-08: 114 mg via INTRAVENOUS
  Filled 2017-02-08: qty 19

## 2017-02-08 MED ORDER — DIPHENHYDRAMINE HCL 50 MG/ML IJ SOLN
INTRAMUSCULAR | Status: AC
  Filled 2017-02-08: qty 1

## 2017-02-08 MED ORDER — SODIUM CHLORIDE 0.9 % IV SOLN
Freq: Once | INTRAVENOUS | Status: AC
  Administered 2017-02-08: 13:00:00 via INTRAVENOUS

## 2017-02-08 NOTE — Patient Instructions (Addendum)
[facility] Discharge Instructions for Patients Receiving Chemotherapy  Today you received the following chemotherapy agents:  Taxol.  To help prevent nausea and vomiting after your treatment, we encourage you to take your nausea medication as directed.   If you develop nausea and vomiting that is not controlled by your nausea medication, call the clinic.   BELOW ARE SYMPTOMS THAT SHOULD BE REPORTED IMMEDIATELY:  *FEVER GREATER THAN 100.5 F  *CHILLS WITH OR WITHOUT FEVER  NAUSEA AND VOMITING THAT IS NOT CONTROLLED WITH YOUR NAUSEA MEDICATION  *UNUSUAL SHORTNESS OF BREATH  *UNUSUAL BRUISING OR BLEEDING  TENDERNESS IN MOUTH AND THROAT WITH OR WITHOUT PRESENCE OF ULCERS  *URINARY PROBLEMS  *BOWEL PROBLEMS  UNUSUAL RASH Items with * indicate a potential emergency and should be followed up as soon as possible.  Feel free to call the clinic should you have any questions or concerns. The clinic phone number is [phone].  Please show the CHEMO ALERT CARD at check-in to the Emergency Department and triage nurse.   

## 2017-02-08 NOTE — Patient Instructions (Signed)
Implanted Port Home Guide An implanted port is a type of central line that is placed under the skin. Central lines are used to provide IV access when treatment or nutrition needs to be given through a person's veins. Implanted ports are used for long-term IV access. An implanted port may be placed because:  You need IV medicine that would be irritating to the small veins in your hands or arms.  You need long-term IV medicines, such as antibiotics.  You need IV nutrition for a long period.  You need frequent blood draws for lab tests.  You need dialysis.  Implanted ports are usually placed in the chest area, but they can also be placed in the upper arm, the abdomen, or the leg. An implanted port has two main parts:  Reservoir. The reservoir is round and will appear as a small, raised area under your skin. The reservoir is the part where a needle is inserted to give medicines or draw blood.  Catheter. The catheter is a thin, flexible tube that extends from the reservoir. The catheter is placed into a large vein. Medicine that is inserted into the reservoir goes into the catheter and then into the vein.  How will I care for my incision site? Do not get the incision site wet. Bathe or shower as directed by your health care provider. How is my port accessed? Special steps must be taken to access the port:  Before the port is accessed, a numbing cream can be placed on the skin. This helps numb the skin over the port site.  Your health care provider uses a sterile technique to access the port. ? Your health care provider must put on a mask and sterile gloves. ? The skin over your port is cleaned carefully with an antiseptic and allowed to dry. ? The port is gently pinched between sterile gloves, and a needle is inserted into the port.  Only "non-coring" port needles should be used to access the port. Once the port is accessed, a blood return should be checked. This helps ensure that the port  is in the vein and is not clogged.  If your port needs to remain accessed for a constant infusion, a clear (transparent) bandage will be placed over the needle site. The bandage and needle will need to be changed every week, or as directed by your health care provider.  Keep the bandage covering the needle clean and dry. Do not get it wet. Follow your health care provider's instructions on how to take a shower or bath while the port is accessed.  If your port does not need to stay accessed, no bandage is needed over the port.  What is flushing? Flushing helps keep the port from getting clogged. Follow your health care provider's instructions on how and when to flush the port. Ports are usually flushed with saline solution or a medicine called heparin. The need for flushing will depend on how the port is used.  If the port is used for intermittent medicines or blood draws, the port will need to be flushed: ? After medicines have been given. ? After blood has been drawn. ? As part of routine maintenance.  If a constant infusion is running, the port may not need to be flushed.  How long will my port stay implanted? The port can stay in for as long as your health care provider thinks it is needed. When it is time for the port to come out, surgery will be   done to remove it. The procedure is similar to the one performed when the port was put in. When should I seek immediate medical care? When you have an implanted port, you should seek immediate medical care if:  You notice a bad smell coming from the incision site.  You have swelling, redness, or drainage at the incision site.  You have more swelling or pain at the port site or the surrounding area.  You have a fever that is not controlled with medicine.  This information is not intended to replace advice given to you by your health care provider. Make sure you discuss any questions you have with your health care provider. Document  Released: 05/04/2005 Document Revised: 10/10/2015 Document Reviewed: 01/09/2013 Elsevier Interactive Patient Education  2017 Elsevier Inc.  

## 2017-02-08 NOTE — Telephone Encounter (Signed)
Scheduled appt per 9/24 los - Gave patient AVS and calender per los.

## 2017-02-10 ENCOUNTER — Encounter: Payer: Self-pay | Admitting: Hematology and Oncology

## 2017-02-10 ENCOUNTER — Ambulatory Visit: Payer: Self-pay | Admitting: *Deleted

## 2017-02-10 DIAGNOSIS — R197 Diarrhea, unspecified: Secondary | ICD-10-CM | POA: Insufficient documentation

## 2017-02-10 NOTE — Assessment & Plan Note (Signed)
She attributed diarrhea due to chemotherapy This is very rare I recommend her to take Imodium and Lomotil as needed and plan to reduce a dose of treatment

## 2017-02-10 NOTE — Progress Notes (Signed)
[place] OFFICE PROGRESS NOTE  Patient Care Team: McDiarmid, Blane Ohara, MD as PCP - General (Family Medicine) Gaye Pollack, MD (Cardiothoracic Surgery) Dickie La, MD (Family Medicine) Heath Lark, MD as Consulting Physician (Hematology and Oncology) Pleasant, Eppie Gibson, RN as [place], [place], [place] (Optometry) Milus Banister, MD as Consulting Physician (Gastroenterology) Melida Quitter, MD as Consulting Physician (Otolaryngology) Standley Brooking, LCSW as [place] Management (Licensed Clinical Social Worker)  SUMMARY OF ONCOLOGIC HISTORY: Oncology History   Colon cancer   Primary site: Colon and Rectum (Left)   Staging method: AJCC 7th Edition   Clinical: Stage I (T2, N0, M0) signed by Heath Lark, MD on 05/31/2013  2:39 PM   Pathologic: Stage I (T2, N0, cM0) signed by Heath Lark, MD on 05/31/2013  2:39 PM   Summary: Stage I (T2, N0, cM0) Lung cancer, EGFR/ALK negative, recurrence after initial resection to LN and brain   Primary site: Lung (Left)   Staging method: AJCC 7th Edition   Clinical: Stage IV (T1, N2, M1b) signed by Heath Lark, MD on 05/31/2013  2:26 PM   Pathologic: Stage IV (T1, N2, M1b) signed by Heath Lark, MD on 05/31/2013  2:26 PM   Summary: Stage IV (T1, N2, M1b)       Cancer of upper lobe of left lung, Adenocarcinoma   03/01/2007 Procedure    Colonoscopy revealed abnormalities and biopsy show high-grade dysplasia      04/01/2007 Surgery    She underwent sigmoid resection which showed T2 N0 colon cancer, and negative margins and all of 17 lymph nodes were negative      02/29/2008 Procedure    Repeat surveillance colonoscopy was negative.      06/16/2010 Surgery    She underwent left upper lobectomy we show well-differentiated adenocarcinoma of the lung, T1, N0, M0      03/18/2011 Procedure    Repeat colonoscopy show multiple polyps but there were benign      10/19/2011  Procedure    Biopsy of mediastinal lymph node came back positive for recurrence of lung cancer, EGFR and ALK negative      11/16/2011 - 12/14/2011 Chemotherapy    She received concurrent chemoradiation therapy with weekly carboplatin and Taxol.      11/16/2011 - 12/29/2011 Radiation Therapy    She received radiation therapy with weekly chemotherapy      02/15/2012 Imaging    MR of the brain showed a new intracranial metastases. This was subsequently treated with stereotactic radiosurgery.      03/07/2012 - 04/12/2013 Chemotherapy    She received chemotherapy with maintainence Alimta every 3 weeks. Chemotherapy was discontinued due to profound fatigue      03/02/2013 Procedure    She had therapeutic ultrasound guidance thoracentesis for pleural effusion that came back negative for cancer      05/04/2013 Procedure    She had repeat ultrasound-guided thoracentesis again and cytology was negative      05/29/2013 Imaging    Repeat CT scan of the chest, abdomen and pelvis show no evidence of disease but persistent right-sided pleural effusion      06/02/2013 Surgery    The patient had placement of Pleurx catheter and subsequently underwent pleurodesis.      09/22/2013 Imaging    Repeat CT scan show no evidence of active disease. There are nonspecific lymphadenopathy and she is placed on observation.      03/23/2014 Imaging  Repeat CT scan of the chest, abdomen and pelvis show recurrence of cancer with widespread bilateral pulmonary metastasis.      05/14/2014 Imaging    Imaging of the chest and brain were repeated due to delay of initiation of chemotherapy. Overall, chest CT scan show stable disease. MRI of the head was negative for recurrence      05/16/2014 - 07/18/2014 Chemotherapy     she completed 4 cycles of combination chemotherapy with carboplatin and Alimta      07/16/2014 Imaging     repeat CT scan of the chest, abdomen and pelvis show regression in the size of lung  nodules.      08/29/2014 - 08/14/2015 Chemotherapy    She received maintenance Alimta      10/16/2014 Imaging     repeat CT scan of the chest, abdomen and pelvis show regression in the size of lung nodules.      01/29/2015 Imaging    Repeat CT scan showed stable disease      02/01/2015 Imaging    MRI brain is negative      04/30/2015 Imaging    CT scan of the chest, abdomen and pelvis showed stable disease      07/25/2015 Imaging    MRI brain showed no evidence of new disease      09/09/2015 Imaging    CT chest showed interval increase in size of large RIGHT upper lobe nodule is most consistent with lung cancer recurrence.      09/18/2015 - 03/26/2016 Chemotherapy    She started on Nivolumab      10/22/2015 Imaging    Screening mammogram showed mild abnormality      10/31/2015 Imaging    Diagnostic mammogram showed mild calcification at the right upper outer breast      11/27/2015 Imaging    Stable post treatment related changes of left lower lobectomy and radiation therapy redemonstrated, as above. No definite findings to suggest local recurrence of disease or metastatic disease on today's examination      01/24/2016 Imaging    MR brain showed continued stable appearance of two small treated brain metastases. No new or progressive metastatic disease to the brain.      04/15/2016 Imaging    Ct scan showed findings highly suspicious for recurrent tumor in the mediastinum surrounding the right mainstem bronchus and in the region of the azygoesophageal recess. Diffuse pulmonary metastatic disease with new and progressive nodules since the prior examination.      04/24/2016 - 12/18/2016 Chemotherapy    The patient had chemotherapy with gemzar and Carboplatin      04/30/2016 Imaging    Decreased contrast enhancement of the two treated metastases in the right frontal and left occipital lobes. No new lesions.      06/01/2016 Imaging    CXR showed numerous metastatic nodules have  progressed compared to the most recent comparison chest x-ray of 07/15/2015. Majority of these were noted on the 04/15/2016 chest CT. No segmental infiltrate or pulmonary edema.      06/19/2016 Adverse Reaction    Treatment is placed on hold today due to neutropenia and the patient not feeling well      06/29/2016 Imaging    Interval decrease in size of metastatic pulmonary nodules. 2. Persistent but slightly improved right hilar/mediastinal disease. No progressive findings. 3. No findings for upper abdominal metastatic disease      10/01/2016 Imaging    Mild increase in size of 2.5 cm central right  lower lobe pulmonary nodule, consistent with metastatic disease. Numerous other bilateral sub-cm pulmonary nodules are stable.  Stable mild subcarinal and right cardiophrenic angle lymphadenopathy. Stable emphysema and right lung radiation changes. Stable hepatic steatosis. Aortic and coronary artery atherosclerosis.      11/06/2016 Imaging    MR brain: Stable treated RIGHT insula and LEFT occipital metastases.      12/30/2016 Imaging    1. Bilateral pulmonary nodules are minimally progressive from 10/01/2016. Dominant nodule in the superior segment right lower lobe is stable. 2. Aortic atherosclerosis (ICD10-170.0). Coronary artery calcification.      01/08/2017 - 01/08/2017 Chemotherapy    She received 1 dose of Abraxane, discontinued because of insurance coverage       Brain metastases treated with radiosurgery   02/26/2012 Initial Diagnosis    Brain metastases treated with radiosurgery       INTERVAL HISTORY: Please see below for problem oriented charting. She returns for chemotherapy She tolerated treatment poorly She has poor appetite and frequent diarrhea with treatment She denies worsening peripheral neuropathy No recent infection She have chronic cough/COPD She is not willing to quit smoking Denies hemoptysis No recent nausea or vomiting  REVIEW OF SYSTEMS:    Constitutional: Denies fevers, chills or abnormal weight loss Eyes: Denies blurriness of vision Ears, nose, mouth, throat, and face: Denies mucositis or sore throat Cardiovascular: Denies palpitation, chest discomfort or lower extremity swelling Skin: Denies abnormal skin rashes Lymphatics: Denies new lymphadenopathy or easy bruising Neurological:Denies numbness, tingling or new weaknesses Behavioral/Psych: Mood is stable, no new changes  All other systems were reviewed with the patient and are negative.  I have reviewed the past medical history, past surgical history, social history and family history with the patient and they are unchanged from previous note.  ALLERGIES:  is allergic to adhesive [tape] and lisinopril.  MEDICATIONS:  Current Outpatient Prescriptions  Medication Sig Dispense Refill  . aspirin EC 81 MG tablet Take 81 mg by mouth daily.    Marland Kitchen BYSTOLIC 10 MG tablet Take 10 mg by mouth daily.     . diclofenac sodium (VOLTAREN) 1 % GEL Apply 2 g topically 4 (four) times daily as needed. (Patient taking differently: Apply 2 g topically 4 (four) times daily as needed (pain). ) 100 g 1  . diphenoxylate-atropine (LOMOTIL) 2.5-0.025 MG tablet Take 1 tablet by mouth 4 (four) times daily as needed for diarrhea or loose stools. 30 tablet 0  . HYDROmorphone (DILAUDID) 8 MG tablet Take 1 tablet (8 mg total) by mouth every 6 (six) hours as needed for severe pain. 60 tablet 0  . ipratropium (ATROVENT HFA) 17 MCG/ACT inhaler Inhale 2 puffs into the lungs every 4 (four) hours as needed for wheezing (Cough). 3 Inhaler 3  . lidocaine-prilocaine (EMLA) cream APPLY TOPICALLY AS NEEDED TO PORTA CATH SITE 1 HOUR PRIOR TO NEEDLE STICK. 30 g 0  . loperamide (IMODIUM A-D) 2 MG tablet Take 2 mg by mouth 4 (four) times daily as needed for diarrhea or loose stools.    Marland Kitchen loratadine (CLARITIN) 10 MG tablet Take 10 mg by mouth daily.    Marland Kitchen LORazepam (ATIVAN) 0.5 MG tablet Take 1 tablet (0.5 mg total) by  mouth every 8 (eight) hours as needed for anxiety. 60 tablet 1  . mirtazapine (REMERON) 15 MG tablet Take 1 tablet (15 mg total) by mouth at bedtime. 30 tablet 11  . nitroGLYCERIN (NITROSTAT) 0.4 MG SL tablet Place 1 tablet (0.4 mg total) under the tongue every 5 (  five) minutes as needed. For chest pain (Patient not taking: Reported on 01/27/2017) 25 tablet 5  . omeprazole (PRILOSEC) 40 MG capsule Take 40 mg by mouth daily.     . ondansetron (ZOFRAN ODT) 4 MG disintegrating tablet Take 1 tablet (4 mg total) by mouth every 8 (eight) hours as needed for nausea or vomiting. 20 tablet 0  . pravastatin (PRAVACHOL) 40 MG tablet Take 40 mg by mouth daily.     . prochlorperazine (COMPAZINE) 10 MG tablet Take 1 tablet (10 mg total) by mouth every 6 (six) hours as needed for nausea or vomiting. (Patient not taking: Reported on 01/27/2017) 30 tablet 1  . umeclidinium-vilanterol (ANORO ELLIPTA) 62.5-25 MCG/INH AEPB Inhale 1 puff into the lungs daily. 3 each 3  . VENTOLIN HFA 108 (90 Base) MCG/ACT inhaler Inhale 2 puffs into the lungs every 4 (four) hours as needed for wheezing or shortness of breath. 3 Inhaler 3   No current facility-administered medications for this visit.    Facility-Administered Medications Ordered in Other Visits  Medication Dose Route Frequency Provider Last Rate Last Dose  . sodium chloride 0.9 % injection 10 mL  10 mL Intravenous PRN Alvy Bimler, Buell Parcel, MD   10 mL at 09/18/15 1345  . sodium chloride 0.9 % injection 10 mL  10 mL Intravenous PRN Alvy Bimler, Blandon Offerdahl, MD   10 mL at 04/24/16 1136  . sodium chloride 0.9 % injection 10 mL  10 mL Intravenous PRN Alvy Bimler, Markela Wee, MD   10 mL at 01/15/17 1322    PHYSICAL EXAMINATION: ECOG PERFORMANCE STATUS: 2 - Symptomatic, <50% confined to bed  Vitals:   02/08/17 1111  BP: (!) 150/72  Pulse: 84  Resp: 18  Temp: 98 F (36.7 C)  SpO2: 100%   Filed Weights   02/08/17 1111  Weight: 151 lb 12.8 oz (68.9 kg)    GENERAL:alert, no distress and comfortable.   She looks frail SKIN: skin color, texture, turgor are normal, no rashes or significant lesions EYES: normal, Conjunctiva are pink and non-injected, sclera clear OROPHARYNX:no exudate, no erythema and lips, buccal mucosa, and tongue normal  NECK: supple, thyroid normal size, non-tender, without nodularity LYMPH:  no palpable lymphadenopathy in the cervical, axillary or inguinal LUNGS: clear to auscultation and percussion with normal breathing effort HEART: regular rate & rhythm and no murmurs with moderate bilateral lower extremity edema ABDOMEN:abdomen soft, non-tender and normal bowel sounds Musculoskeletal:no cyanosis of digits and no clubbing  NEURO: alert & oriented x 3 with fluent speech, no focal motor/sensory deficits  LABORATORY DATA:  I have reviewed the data as listed    Component Value Date/Time   NA 143 02/08/2017 1023   K 3.7 02/08/2017 1023   CL 107 01/25/2017 1743   CL 102 10/24/2012 1001   CO2 26 02/08/2017 1023   GLUCOSE 87 02/08/2017 1023   GLUCOSE 111 (H) 10/24/2012 1001   BUN 9.9 02/08/2017 1023   CREATININE 0.9 02/08/2017 1023   CALCIUM 9.2 02/08/2017 1023   PROT 6.6 02/08/2017 1023   ALBUMIN 3.0 (L) 02/08/2017 1023   AST 10 02/08/2017 1023   ALT <3 02/08/2017 1023   ALKPHOS 108 02/08/2017 1023   BILITOT 0.42 02/08/2017 1023   GFRNONAA >60 01/25/2017 1743   GFRAA >60 01/25/2017 1743    No results found for: SPEP, UPEP  Lab Results  Component Value Date   WBC 3.7 (L) 02/08/2017   NEUTROABS 2.4 02/08/2017   HGB 10.4 (L) 02/08/2017   HCT 34.7 (L) 02/08/2017  MCV 84.0 02/08/2017   PLT 204 02/08/2017      Chemistry      Component Value Date/Time   NA 143 02/08/2017 1023   K 3.7 02/08/2017 1023   CL 107 01/25/2017 1743   CL 102 10/24/2012 1001   CO2 26 02/08/2017 1023   BUN 9.9 02/08/2017 1023   CREATININE 0.9 02/08/2017 1023      Component Value Date/Time   CALCIUM 9.2 02/08/2017 1023   ALKPHOS 108 02/08/2017 1023   AST 10 02/08/2017  1023   ALT <3 02/08/2017 1023   BILITOT 0.42 02/08/2017 1023     ASSESSMENT & PLAN:  Cancer of upper lobe of left lung, Adenocarcinoma She tolerated treatment poorly with diarrhea and poor appetite I will reduce the dose of treatment and continue aggressive supportive care I recommend repeat imaging study next month to assess response to treatment  Pancytopenia, acquired ([place]) Pancytopenia is due to chemo I have requested addition of Neulasta in addition to dose reduction She is not symptomatic from anemia or thrombocytopenia.  Neuropathy due to chemotherapeutic drug ([place]) She has been prescribed gabapentin as needed and along with narcotic prescription as needed She will receive reduced dose treatment  Diarrhea She attributed diarrhea due to chemotherapy This is very rare I recommend her to take Imodium and Lomotil as needed and plan to reduce a dose of treatment  TOBACCO ABUSE I spent some time counseling the patient the importance of tobacco cessation. She is currently not interested to quit now.   Orders Placed This Encounter  Procedures  . CT CHEST W CONTRAST    Standing Status:   Future    Standing Expiration Date:   02/08/2018    Order Specific Question:   If indicated for the ordered procedure, I authorize the administration of contrast media per Radiology protocol    Answer:   Yes    Order Specific Question:   Preferred imaging location?    Answer:   [facility]    Order Specific Question:   Radiology Contrast Protocol - do NOT remove file path    Answer:   [email or web address]   All questions were answered. The patient knows to call the clinic with any problems, questions or concerns. No barriers to learning was detected. I spent 25 minutes counseling the patient face to face. The total time spent in the appointment was 40 minutes and more than 50% was on counseling and review of test results     Heath Lark, MD 02/10/2017 11:10  AM

## 2017-02-10 NOTE — Assessment & Plan Note (Signed)
She has been prescribed gabapentin as needed and along with narcotic prescription as needed She will receive reduced dose treatment

## 2017-02-10 NOTE — Assessment & Plan Note (Signed)
Pancytopenia is due to chemo I have requested addition of Neulasta in addition to dose reduction She is not symptomatic from anemia or thrombocytopenia.

## 2017-02-10 NOTE — Assessment & Plan Note (Signed)
I spent some time counseling the patient the importance of tobacco cessation. She is currently not interested to quit now.

## 2017-02-10 NOTE — Assessment & Plan Note (Signed)
She tolerated treatment poorly with diarrhea and poor appetite I will reduce the dose of treatment and continue aggressive supportive care I recommend repeat imaging study next month to assess response to treatment

## 2017-02-11 DIAGNOSIS — R5383 Other fatigue: Secondary | ICD-10-CM | POA: Diagnosis not present

## 2017-02-12 ENCOUNTER — Other Ambulatory Visit: Payer: Self-pay

## 2017-02-12 ENCOUNTER — Ambulatory Visit: Payer: Self-pay

## 2017-02-15 ENCOUNTER — Telehealth: Payer: Self-pay | Admitting: Hematology and Oncology

## 2017-02-15 ENCOUNTER — Other Ambulatory Visit: Payer: Self-pay

## 2017-02-15 ENCOUNTER — Other Ambulatory Visit: Payer: Self-pay | Admitting: Hematology and Oncology

## 2017-02-15 ENCOUNTER — Encounter: Payer: Self-pay | Admitting: Nutrition

## 2017-02-15 ENCOUNTER — Ambulatory Visit: Payer: Self-pay

## 2017-02-15 NOTE — Telephone Encounter (Signed)
Spoke with patient regarding her appts that were added per 10/1 sch msg.

## 2017-02-15 NOTE — Progress Notes (Signed)
[place] contacted our Education officer, museum and requested Ensure Plus for patient. I contacted patient by telephone.  Patient is familiar to me from prior interactions. Patient is interested in picking up ensure plus on Monday, October 10 when she comes for treatment. I provided directions for patient to pick up one complementary case of Ensure Plus in the patient and family support area. Patient voiced understanding and appreciation.  **Disclaimer: This note was dictated with voice recognition software. Similar sounding words can inadvertently be transcribed and this note may contain transcription errors which may not have been corrected upon publication of note.**

## 2017-02-15 NOTE — Patient Outreach (Signed)
Request received from Bary Castilla.  To refer Mobile Meals Delivery.  Referral made on 02/15/17.  Meals will begin 02/17/17.  Programmer, multimedia will notify patient.

## 2017-02-16 ENCOUNTER — Other Ambulatory Visit: Payer: Self-pay | Admitting: *Deleted

## 2017-02-16 NOTE — Patient Outreach (Signed)
[place] [facility]) Care Management  02/16/2017  [name] Dec 16, 1950 [id number]  RN Health Coach received telephone call from patient.  Per patient stated that she had missed appointment yesterday due to transportation issues. Patient also stated that housing authority will be coming to place a ramp for her apartment. RN Health coach made patient that she will be receiving a month supply of meals on wheels and ensure will be sent to her house.   [place] Care Management [phone]

## 2017-02-18 ENCOUNTER — Other Ambulatory Visit: Payer: Self-pay | Admitting: *Deleted

## 2017-02-18 ENCOUNTER — Ambulatory Visit: Payer: Self-pay | Admitting: *Deleted

## 2017-02-18 NOTE — Patient Outreach (Signed)
[place] [facility]) Care Management  02/18/2017  Lauren [name] 02/25/1951 [id number]   CSW called & spoke with patient to follow-up on community resources. Patient states that she was supposed to have started to receive Meals on Wheels yesterday, but had not heard anything from them. CSW left message for [employer] and will follow-up. Patient states that she will be at the [facility] all day Monday and plans to pick up a month supply of ensure that had been arranged by the Registered Dietician at the cancer center. She is also waiting for housing authority to build a ramp for her as well, as she has gotten weaker from this new chemo treatment and has been having to use a walker due to her ankles being weak.   CSW will follow-up with [employer] re: Meals on Wheels.    [name], LCSW [facility]  Clinical Social Worker cell #: [phone]

## 2017-02-22 ENCOUNTER — Ambulatory Visit (HOSPITAL_BASED_OUTPATIENT_CLINIC_OR_DEPARTMENT_OTHER): Payer: Medicare HMO | Admitting: Hematology and Oncology

## 2017-02-22 ENCOUNTER — Other Ambulatory Visit (HOSPITAL_BASED_OUTPATIENT_CLINIC_OR_DEPARTMENT_OTHER): Payer: Medicare HMO

## 2017-02-22 ENCOUNTER — Encounter: Payer: Self-pay | Admitting: Hematology and Oncology

## 2017-02-22 ENCOUNTER — Ambulatory Visit (HOSPITAL_BASED_OUTPATIENT_CLINIC_OR_DEPARTMENT_OTHER): Payer: Medicare HMO

## 2017-02-22 ENCOUNTER — Ambulatory Visit: Payer: Medicare HMO

## 2017-02-22 DIAGNOSIS — Z720 Tobacco use: Secondary | ICD-10-CM

## 2017-02-22 DIAGNOSIS — Z5111 Encounter for antineoplastic chemotherapy: Secondary | ICD-10-CM | POA: Diagnosis not present

## 2017-02-22 DIAGNOSIS — C3412 Malignant neoplasm of upper lobe, left bronchus or lung: Secondary | ICD-10-CM

## 2017-02-22 DIAGNOSIS — C7931 Secondary malignant neoplasm of brain: Secondary | ICD-10-CM

## 2017-02-22 DIAGNOSIS — C3490 Malignant neoplasm of unspecified part of unspecified bronchus or lung: Secondary | ICD-10-CM

## 2017-02-22 DIAGNOSIS — D61818 Other pancytopenia: Secondary | ICD-10-CM | POA: Diagnosis not present

## 2017-02-22 DIAGNOSIS — C7949 Secondary malignant neoplasm of other parts of nervous system: Secondary | ICD-10-CM

## 2017-02-22 DIAGNOSIS — F17200 Nicotine dependence, unspecified, uncomplicated: Secondary | ICD-10-CM

## 2017-02-22 DIAGNOSIS — Z7189 Other specified counseling: Secondary | ICD-10-CM

## 2017-02-22 DIAGNOSIS — R600 Localized edema: Secondary | ICD-10-CM

## 2017-02-22 DIAGNOSIS — L659 Nonscarring hair loss, unspecified: Secondary | ICD-10-CM | POA: Diagnosis not present

## 2017-02-22 LAB — CBC WITH DIFFERENTIAL/PLATELET
BASO%: 0.5 % (ref 0.0–2.0)
BASOS ABS: 0 10*3/uL (ref 0.0–0.1)
EOS ABS: 0.1 10*3/uL (ref 0.0–0.5)
EOS%: 1.2 % (ref 0.0–7.0)
HEMATOCRIT: 36.5 % (ref 34.8–46.6)
HGB: 10.9 g/dL — ABNORMAL LOW (ref 11.6–15.9)
LYMPH#: 0.8 10*3/uL — AB (ref 0.9–3.3)
LYMPH%: 20.4 % (ref 14.0–49.7)
MCH: 24.7 pg — ABNORMAL LOW (ref 25.1–34.0)
MCHC: 29.9 g/dL — AB (ref 31.5–36.0)
MCV: 82.8 fL (ref 79.5–101.0)
MONO#: 0.4 10*3/uL (ref 0.1–0.9)
MONO%: 9.2 % (ref 0.0–14.0)
NEUT#: 2.8 10*3/uL (ref 1.5–6.5)
NEUT%: 68.7 % (ref 38.4–76.8)
PLATELETS: 143 10*3/uL — AB (ref 145–400)
RBC: 4.41 10*6/uL (ref 3.70–5.45)
RDW: 20.4 % — ABNORMAL HIGH (ref 11.2–14.5)
WBC: 4.1 10*3/uL (ref 3.9–10.3)

## 2017-02-22 LAB — COMPREHENSIVE METABOLIC PANEL
AST: 9 U/L (ref 5–34)
Albumin: 3 g/dL — ABNORMAL LOW (ref 3.5–5.0)
Alkaline Phosphatase: 123 U/L (ref 40–150)
Anion Gap: 6 mEq/L (ref 3–11)
BILIRUBIN TOTAL: 0.38 mg/dL (ref 0.20–1.20)
BUN: 11.1 mg/dL (ref 7.0–26.0)
CALCIUM: 9.3 mg/dL (ref 8.4–10.4)
CHLORIDE: 110 meq/L — AB (ref 98–109)
CO2: 26 meq/L (ref 22–29)
CREATININE: 1 mg/dL (ref 0.6–1.1)
EGFR: 69 mL/min/{1.73_m2} — ABNORMAL LOW (ref >90)
Glucose: 93 mg/dl (ref 70–140)
Potassium: 4.1 mEq/L (ref 3.5–5.1)
Sodium: 142 mEq/L (ref 136–145)
TOTAL PROTEIN: 6.5 g/dL (ref 6.4–8.3)

## 2017-02-22 MED ORDER — PALONOSETRON HCL INJECTION 0.25 MG/5ML
INTRAVENOUS | Status: AC
  Filled 2017-02-22: qty 5

## 2017-02-22 MED ORDER — SODIUM CHLORIDE 0.9% FLUSH
10.0000 mL | INTRAVENOUS | Status: DC | PRN
Start: 2017-02-22 — End: 2017-02-22
  Administered 2017-02-22: 10 mL
  Filled 2017-02-22: qty 10

## 2017-02-22 MED ORDER — DIPHENHYDRAMINE HCL 50 MG/ML IJ SOLN
INTRAMUSCULAR | Status: AC
  Filled 2017-02-22: qty 1

## 2017-02-22 MED ORDER — FAMOTIDINE IN NACL 20-0.9 MG/50ML-% IV SOLN
INTRAVENOUS | Status: AC
  Filled 2017-02-22: qty 50

## 2017-02-22 MED ORDER — HEPARIN SOD (PORK) LOCK FLUSH 100 UNIT/ML IV SOLN
500.0000 [IU] | Freq: Once | INTRAVENOUS | Status: AC | PRN
  Administered 2017-02-22: 500 [IU]
  Filled 2017-02-22: qty 5

## 2017-02-22 MED ORDER — FUROSEMIDE 20 MG PO TABS
20.0000 mg | ORAL_TABLET | Freq: Every day | ORAL | 1 refills | Status: DC
Start: 2017-02-22 — End: 2017-03-11

## 2017-02-22 MED ORDER — SODIUM CHLORIDE 0.9 % IJ SOLN
10.0000 mL | INTRAMUSCULAR | Status: DC | PRN
  Administered 2017-02-22: 10 mL via INTRAVENOUS
  Filled 2017-02-22: qty 10

## 2017-02-22 MED ORDER — FAMOTIDINE IN NACL 20-0.9 MG/50ML-% IV SOLN
20.0000 mg | Freq: Once | INTRAVENOUS | Status: AC
  Administered 2017-02-22: 20 mg via INTRAVENOUS

## 2017-02-22 MED ORDER — DEXAMETHASONE SODIUM PHOSPHATE 10 MG/ML IJ SOLN
10.0000 mg | Freq: Once | INTRAMUSCULAR | Status: AC
  Administered 2017-02-22: 10 mg via INTRAVENOUS

## 2017-02-22 MED ORDER — DEXAMETHASONE SODIUM PHOSPHATE 10 MG/ML IJ SOLN
INTRAMUSCULAR | Status: AC
  Filled 2017-02-22: qty 1

## 2017-02-22 MED ORDER — LIDOCAINE-PRILOCAINE 2.5-2.5 % EX CREA: TOPICAL_CREAM | Freq: Once | CUTANEOUS | 11 refills | Status: AC

## 2017-02-22 MED ORDER — PACLITAXEL CHEMO INJECTION 300 MG/50ML
64.0000 mg/m2 | Freq: Once | INTRAVENOUS | Status: AC
  Administered 2017-02-22: 114 mg via INTRAVENOUS
  Filled 2017-02-22: qty 19

## 2017-02-22 MED ORDER — DIPHENHYDRAMINE HCL 50 MG/ML IJ SOLN
50.0000 mg | Freq: Once | INTRAMUSCULAR | Status: AC
  Administered 2017-02-22: 50 mg via INTRAVENOUS

## 2017-02-22 MED ORDER — PALONOSETRON HCL INJECTION 0.25 MG/5ML
0.2500 mg | Freq: Once | INTRAVENOUS | Status: AC
  Administered 2017-02-22: 0.25 mg via INTRAVENOUS

## 2017-02-22 MED ORDER — SODIUM CHLORIDE 0.9 % IV SOLN
Freq: Once | INTRAVENOUS | Status: AC
  Administered 2017-02-22: 13:00:00 via INTRAVENOUS

## 2017-02-22 MED FILL — FUROSEMIDE 20 MG TAB: 20 | 30 days supply | Qty: 30 | Fill #0

## 2017-02-22 MED FILL — LIDOCAINE-PRILOCAINE CREAM: 2.5-2.5 | 10 days supply | Qty: 30 | Fill #0

## 2017-02-22 NOTE — Assessment & Plan Note (Signed)
She tolerated treatment poorly with diarrhea and poor appetite I will reduce the dose of treatment and continue aggressive supportive care Her CT imaging is scheduled for tomorrow If scans show favorable result, she will continue treatment If CT scan show progression of disease, there is very limited options of what we can do moving forward We will keep her appointment assess for discussion next week

## 2017-02-22 NOTE — Patient Instructions (Signed)
[facility] Discharge Instructions for Patients Receiving Chemotherapy  Today you received the following chemotherapy agents:  Taxol.  To help prevent nausea and vomiting after your treatment, we encourage you to take your nausea medication as directed.   If you develop nausea and vomiting that is not controlled by your nausea medication, call the clinic.   BELOW ARE SYMPTOMS THAT SHOULD BE REPORTED IMMEDIATELY:  *FEVER GREATER THAN 100.5 F  *CHILLS WITH OR WITHOUT FEVER  NAUSEA AND VOMITING THAT IS NOT CONTROLLED WITH YOUR NAUSEA MEDICATION  *UNUSUAL SHORTNESS OF BREATH  *UNUSUAL BRUISING OR BLEEDING  TENDERNESS IN MOUTH AND THROAT WITH OR WITHOUT PRESENCE OF ULCERS  *URINARY PROBLEMS  *BOWEL PROBLEMS  UNUSUAL RASH Items with * indicate a potential emergency and should be followed up as soon as possible.  Feel free to call the clinic should you have any questions or concerns. The clinic phone number is [phone].  Please show the CHEMO ALERT CARD at check-in to the Emergency Department and triage nurse.   

## 2017-02-22 NOTE — Assessment & Plan Note (Signed)
Pancytopenia is due to chemo I have requested addition of Neulasta in addition to dose reduction She is not symptomatic from anemia or thrombocytopenia.

## 2017-02-22 NOTE — Assessment & Plan Note (Signed)
She has bilateral leg edema again Previously, she had chronic lymphedema due to prior surgery Currently, I felt that she is retaining extra fluid I recommend low-dose Lasix as needed daily to keep her weight under 155 pounds

## 2017-02-22 NOTE — Patient Instructions (Signed)
Implanted Port Home Guide An implanted port is a type of central line that is placed under the skin. Central lines are used to provide IV access when treatment or nutrition needs to be given through a person's veins. Implanted ports are used for long-term IV access. An implanted port may be placed because:  You need IV medicine that would be irritating to the small veins in your hands or arms.  You need long-term IV medicines, such as antibiotics.  You need IV nutrition for a long period.  You need frequent blood draws for lab tests.  You need dialysis.  Implanted ports are usually placed in the chest area, but they can also be placed in the upper arm, the abdomen, or the leg. An implanted port has two main parts:  Reservoir. The reservoir is round and will appear as a small, raised area under your skin. The reservoir is the part where a needle is inserted to give medicines or draw blood.  Catheter. The catheter is a thin, flexible tube that extends from the reservoir. The catheter is placed into a large vein. Medicine that is inserted into the reservoir goes into the catheter and then into the vein.  How will I care for my incision site? Do not get the incision site wet. Bathe or shower as directed by your health care provider. How is my port accessed? Special steps must be taken to access the port:  Before the port is accessed, a numbing cream can be placed on the skin. This helps numb the skin over the port site.  Your health care provider uses a sterile technique to access the port. ? Your health care provider must put on a mask and sterile gloves. ? The skin over your port is cleaned carefully with an antiseptic and allowed to dry. ? The port is gently pinched between sterile gloves, and a needle is inserted into the port.  Only "non-coring" port needles should be used to access the port. Once the port is accessed, a blood return should be checked. This helps ensure that the port  is in the vein and is not clogged.  If your port needs to remain accessed for a constant infusion, a clear (transparent) bandage will be placed over the needle site. The bandage and needle will need to be changed every week, or as directed by your health care provider.  Keep the bandage covering the needle clean and dry. Do not get it wet. Follow your health care provider's instructions on how to take a shower or bath while the port is accessed.  If your port does not need to stay accessed, no bandage is needed over the port.  What is flushing? Flushing helps keep the port from getting clogged. Follow your health care provider's instructions on how and when to flush the port. Ports are usually flushed with saline solution or a medicine called heparin. The need for flushing will depend on how the port is used.  If the port is used for intermittent medicines or blood draws, the port will need to be flushed: ? After medicines have been given. ? After blood has been drawn. ? As part of routine maintenance.  If a constant infusion is running, the port may not need to be flushed.  How long will my port stay implanted? The port can stay in for as long as your health care provider thinks it is needed. When it is time for the port to come out, surgery will be   done to remove it. The procedure is similar to the one performed when the port was put in. When should I seek immediate medical care? When you have an implanted port, you should seek immediate medical care if:  You notice a bad smell coming from the incision site.  You have swelling, redness, or drainage at the incision site.  You have more swelling or pain at the port site or the surrounding area.  You have a fever that is not controlled with medicine.  This information is not intended to replace advice given to you by your health care provider. Make sure you discuss any questions you have with your health care provider. Document  Released: 05/04/2005 Document Revised: 10/10/2015 Document Reviewed: 01/09/2013 Elsevier Interactive Patient Education  2017 Elsevier Inc.  

## 2017-02-22 NOTE — Assessment & Plan Note (Signed)
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

## 2017-02-22 NOTE — Assessment & Plan Note (Signed)
I spent some time counseling the patient the importance of tobacco cessation. She is currently not interested to quit now.

## 2017-02-22 NOTE — Progress Notes (Signed)
[place] OFFICE PROGRESS NOTE  Patient Care Team: McDiarmid, Blane Ohara, MD as PCP - General (Family Medicine) Gaye Pollack, MD (Cardiothoracic Surgery) Dickie La, MD (Family Medicine) Heath Lark, MD as Consulting Physician (Hematology and Oncology) Pleasant, Eppie Gibson, RN as [place], [place], [place] (Optometry) Milus Banister, MD as Consulting Physician (Gastroenterology) Melida Quitter, MD as Consulting Physician (Otolaryngology) Standley Brooking, LCSW as [place] Management (Licensed Clinical Social Worker)  SUMMARY OF ONCOLOGIC HISTORY: Oncology History   Colon cancer   Primary site: Colon and Rectum (Left)   Staging method: AJCC 7th Edition   Clinical: Stage I (T2, N0, M0) signed by Heath Lark, MD on 05/31/2013  2:39 PM   Pathologic: Stage I (T2, N0, cM0) signed by Heath Lark, MD on 05/31/2013  2:39 PM   Summary: Stage I (T2, N0, cM0) Lung cancer, EGFR/ALK negative, recurrence after initial resection to LN and brain   Primary site: Lung (Left)   Staging method: AJCC 7th Edition   Clinical: Stage IV (T1, N2, M1b) signed by Heath Lark, MD on 05/31/2013  2:26 PM   Pathologic: Stage IV (T1, N2, M1b) signed by Heath Lark, MD on 05/31/2013  2:26 PM   Summary: Stage IV (T1, N2, M1b)       Cancer of upper lobe of left lung, Adenocarcinoma   03/01/2007 Procedure    Colonoscopy revealed abnormalities and biopsy show high-grade dysplasia      04/01/2007 Surgery    She underwent sigmoid resection which showed T2 N0 colon cancer, and negative margins and all of 17 lymph nodes were negative      02/29/2008 Procedure    Repeat surveillance colonoscopy was negative.      06/16/2010 Surgery    She underwent left upper lobectomy we show well-differentiated adenocarcinoma of the lung, T1, N0, M0      03/18/2011 Procedure    Repeat colonoscopy show multiple polyps but there were benign      10/19/2011  Procedure    Biopsy of mediastinal lymph node came back positive for recurrence of lung cancer, EGFR and ALK negative      11/16/2011 - 12/14/2011 Chemotherapy    She received concurrent chemoradiation therapy with weekly carboplatin and Taxol.      11/16/2011 - 12/29/2011 Radiation Therapy    She received radiation therapy with weekly chemotherapy      02/15/2012 Imaging    MR of the brain showed a new intracranial metastases. This was subsequently treated with stereotactic radiosurgery.      03/07/2012 - 04/12/2013 Chemotherapy    She received chemotherapy with maintainence Alimta every 3 weeks. Chemotherapy was discontinued due to profound fatigue      03/02/2013 Procedure    She had therapeutic ultrasound guidance thoracentesis for pleural effusion that came back negative for cancer      05/04/2013 Procedure    She had repeat ultrasound-guided thoracentesis again and cytology was negative      05/29/2013 Imaging    Repeat CT scan of the chest, abdomen and pelvis show no evidence of disease but persistent right-sided pleural effusion      06/02/2013 Surgery    The patient had placement of Pleurx catheter and subsequently underwent pleurodesis.      09/22/2013 Imaging    Repeat CT scan show no evidence of active disease. There are nonspecific lymphadenopathy and she is placed on observation.      03/23/2014 Imaging  Repeat CT scan of the chest, abdomen and pelvis show recurrence of cancer with widespread bilateral pulmonary metastasis.      05/14/2014 Imaging    Imaging of the chest and brain were repeated due to delay of initiation of chemotherapy. Overall, chest CT scan show stable disease. MRI of the head was negative for recurrence      05/16/2014 - 07/18/2014 Chemotherapy     she completed 4 cycles of combination chemotherapy with carboplatin and Alimta      07/16/2014 Imaging     repeat CT scan of the chest, abdomen and pelvis show regression in the size of lung  nodules.      08/29/2014 - 08/14/2015 Chemotherapy    She received maintenance Alimta      10/16/2014 Imaging     repeat CT scan of the chest, abdomen and pelvis show regression in the size of lung nodules.      01/29/2015 Imaging    Repeat CT scan showed stable disease      02/01/2015 Imaging    MRI brain is negative      04/30/2015 Imaging    CT scan of the chest, abdomen and pelvis showed stable disease      07/25/2015 Imaging    MRI brain showed no evidence of new disease      09/09/2015 Imaging    CT chest showed interval increase in size of large RIGHT upper lobe nodule is most consistent with lung cancer recurrence.      09/18/2015 - 03/26/2016 Chemotherapy    She started on Nivolumab      10/22/2015 Imaging    Screening mammogram showed mild abnormality      10/31/2015 Imaging    Diagnostic mammogram showed mild calcification at the right upper outer breast      11/27/2015 Imaging    Stable post treatment related changes of left lower lobectomy and radiation therapy redemonstrated, as above. No definite findings to suggest local recurrence of disease or metastatic disease on today's examination      01/24/2016 Imaging    MR brain showed continued stable appearance of two small treated brain metastases. No new or progressive metastatic disease to the brain.      04/15/2016 Imaging    Ct scan showed findings highly suspicious for recurrent tumor in the mediastinum surrounding the right mainstem bronchus and in the region of the azygoesophageal recess. Diffuse pulmonary metastatic disease with new and progressive nodules since the prior examination.      04/24/2016 - 12/18/2016 Chemotherapy    The patient had chemotherapy with gemzar and Carboplatin      04/30/2016 Imaging    Decreased contrast enhancement of the two treated metastases in the right frontal and left occipital lobes. No new lesions.      06/01/2016 Imaging    CXR showed numerous metastatic nodules have  progressed compared to the most recent comparison chest x-ray of 07/15/2015. Majority of these were noted on the 04/15/2016 chest CT. No segmental infiltrate or pulmonary edema.      06/19/2016 Adverse Reaction    Treatment is placed on hold today due to neutropenia and the patient not feeling well      06/29/2016 Imaging    Interval decrease in size of metastatic pulmonary nodules. 2. Persistent but slightly improved right hilar/mediastinal disease. No progressive findings. 3. No findings for upper abdominal metastatic disease      10/01/2016 Imaging    Mild increase in size of 2.5 cm central right  lower lobe pulmonary nodule, consistent with metastatic disease. Numerous other bilateral sub-cm pulmonary nodules are stable.  Stable mild subcarinal and right cardiophrenic angle lymphadenopathy. Stable emphysema and right lung radiation changes. Stable hepatic steatosis. Aortic and coronary artery atherosclerosis.      11/06/2016 Imaging    MR brain: Stable treated RIGHT insula and LEFT occipital metastases.      12/30/2016 Imaging    1. Bilateral pulmonary nodules are minimally progressive from 10/01/2016. Dominant nodule in the superior segment right lower lobe is stable. 2. Aortic atherosclerosis (ICD10-170.0). Coronary artery calcification.      01/08/2017 - 01/08/2017 Chemotherapy    She received 1 dose of Abraxane, discontinued because of insurance coverage       Brain metastases treated with radiosurgery   02/26/2012 Initial Diagnosis    Brain metastases treated with radiosurgery       INTERVAL HISTORY: Please see below for problem oriented charting. She returns for chemotherapy and follow-up She denies recent infection She has chronic cough No recent bleeding She denies significant worsening neuropathy She is still smoking but not able to quit She is getting meal provision through Meals on Wheels and felt that was helpful She complain of recent leg edema and 6 pounds weight  gain She has alopecia  REVIEW OF SYSTEMS:   Constitutional: Denies fevers, chills or abnormal weight loss Eyes: Denies blurriness of vision Ears, nose, mouth, throat, and face: Denies mucositis or sore throat Respiratory: Denies cough, dyspnea or wheezes Cardiovascular: Denies palpitation, chest discomfort  Gastrointestinal:  Denies nausea, heartburn or change in bowel habits Skin: Denies abnormal skin rashes Lymphatics: Denies new lymphadenopathy or easy bruising Neurological:Denies numbness, tingling or new weaknesses Behavioral/Psych: Mood is stable, no new changes  All other systems were reviewed with the patient and are negative.  I have reviewed the past medical history, past surgical history, social history and family history with the patient and they are unchanged from previous note.  ALLERGIES:  is allergic to adhesive [tape] and lisinopril.  MEDICATIONS:  Current Outpatient Prescriptions  Medication Sig Dispense Refill  . aspirin EC 81 MG tablet Take 81 mg by mouth daily.    Marland Kitchen BYSTOLIC 10 MG tablet Take 10 mg by mouth daily.     . diclofenac sodium (VOLTAREN) 1 % GEL Apply 2 g topically 4 (four) times daily as needed. (Patient taking differently: Apply 2 g topically 4 (four) times daily as needed (pain). ) 100 g 1  . diphenoxylate-atropine (LOMOTIL) 2.5-0.025 MG tablet Take 1 tablet by mouth 4 (four) times daily as needed for diarrhea or loose stools. 30 tablet 0  . furosemide (LASIX) 20 MG tablet Take 1 tablet (20 mg total) by mouth daily. 30 tablet 1  . HYDROmorphone (DILAUDID) 8 MG tablet Take 1 tablet (8 mg total) by mouth every 6 (six) hours as needed for severe pain. 60 tablet 0  . ipratropium (ATROVENT HFA) 17 MCG/ACT inhaler Inhale 2 puffs into the lungs every 4 (four) hours as needed for wheezing (Cough). 3 Inhaler 3  . lidocaine-prilocaine (EMLA) cream Apply topically once. 30 g 11  . loperamide (IMODIUM A-D) 2 MG tablet Take 2 mg by mouth 4 (four) times daily as  needed for diarrhea or loose stools.    Marland Kitchen loratadine (CLARITIN) 10 MG tablet Take 10 mg by mouth daily.    Marland Kitchen LORazepam (ATIVAN) 0.5 MG tablet Take 1 tablet (0.5 mg total) by mouth every 8 (eight) hours as needed for anxiety. 60 tablet 1  .  mirtazapine (REMERON) 15 MG tablet Take 1 tablet (15 mg total) by mouth at bedtime. 30 tablet 11  . nitroGLYCERIN (NITROSTAT) 0.4 MG SL tablet Place 1 tablet (0.4 mg total) under the tongue every 5 (five) minutes as needed. For chest pain (Patient not taking: Reported on 01/27/2017) 25 tablet 5  . omeprazole (PRILOSEC) 40 MG capsule Take 40 mg by mouth daily.     . ondansetron (ZOFRAN ODT) 4 MG disintegrating tablet Take 1 tablet (4 mg total) by mouth every 8 (eight) hours as needed for nausea or vomiting. 20 tablet 0  . pravastatin (PRAVACHOL) 40 MG tablet Take 40 mg by mouth daily.     . prochlorperazine (COMPAZINE) 10 MG tablet Take 1 tablet (10 mg total) by mouth every 6 (six) hours as needed for nausea or vomiting. (Patient not taking: Reported on 01/27/2017) 30 tablet 1  . umeclidinium-vilanterol (ANORO ELLIPTA) 62.5-25 MCG/INH AEPB Inhale 1 puff into the lungs daily. 3 each 3  . VENTOLIN HFA 108 (90 Base) MCG/ACT inhaler Inhale 2 puffs into the lungs every 4 (four) hours as needed for wheezing or shortness of breath. 3 Inhaler 3   No current facility-administered medications for this visit.    Facility-Administered Medications Ordered in Other Visits  Medication Dose Route Frequency Provider Last Rate Last Dose  . sodium chloride 0.9 % injection 10 mL  10 mL Intravenous PRN Alvy Bimler, Kamri Gotsch, MD   10 mL at 09/18/15 1345  . sodium chloride 0.9 % injection 10 mL  10 mL Intravenous PRN Alvy Bimler, Mell Guia, MD   10 mL at 04/24/16 1136  . sodium chloride 0.9 % injection 10 mL  10 mL Intravenous PRN Alvy Bimler, Oriyah Lamphear, MD   10 mL at 01/15/17 1322    PHYSICAL EXAMINATION: ECOG PERFORMANCE STATUS: 1 - Symptomatic but completely ambulatory  Vitals:   02/22/17 1115  BP: (!) 151/72   Pulse: 73  Resp: 20  Temp: 97.7 F (36.5 C)  SpO2: 100%   Filed Weights   02/22/17 1115  Weight: 156 lb 1.6 oz (70.8 kg)    GENERAL:alert, no distress and comfortable SKIN: skin color, texture, turgor are normal, no rashes or significant lesions EYES: normal, Conjunctiva are pink and non-injected, sclera clear OROPHARYNX:no exudate, no erythema and lips, buccal mucosa, and tongue normal  NECK: supple, thyroid normal size, non-tender, without nodularity LYMPH:  no palpable lymphadenopathy in the cervical, axillary or inguinal LUNGS: clear to auscultation and percussion with normal breathing effort HEART: regular rate & rhythm and no murmurs with moderate bilateral lower extremity edema ABDOMEN:abdomen soft, non-tender and normal bowel sounds Musculoskeletal:no cyanosis of digits and no clubbing  NEURO: alert & oriented x 3 with fluent speech, no focal motor/sensory deficits  LABORATORY DATA:  I have reviewed the data as listed    Component Value Date/Time   NA 142 02/22/2017 1049   K 4.1 02/22/2017 1049   CL 107 01/25/2017 1743   CL 102 10/24/2012 1001   CO2 26 02/22/2017 1049   GLUCOSE 93 02/22/2017 1049   GLUCOSE 111 (H) 10/24/2012 1001   BUN 11.1 02/22/2017 1049   CREATININE 1.0 02/22/2017 1049   CALCIUM 9.3 02/22/2017 1049   PROT 6.5 02/22/2017 1049   ALBUMIN 3.0 (L) 02/22/2017 1049   AST 9 02/22/2017 1049   ALT <6 02/22/2017 1049   ALKPHOS 123 02/22/2017 1049   BILITOT 0.38 02/22/2017 1049   GFRNONAA >60 01/25/2017 1743   GFRAA >60 01/25/2017 1743    No results found for: SPEP,  UPEP  Lab Results  Component Value Date   WBC 4.1 02/22/2017   NEUTROABS 2.8 02/22/2017   HGB 10.9 (L) 02/22/2017   HCT 36.5 02/22/2017   MCV 82.8 02/22/2017   PLT 143 (L) 02/22/2017      Chemistry      Component Value Date/Time   NA 142 02/22/2017 1049   K 4.1 02/22/2017 1049   CL 107 01/25/2017 1743   CL 102 10/24/2012 1001   CO2 26 02/22/2017 1049   BUN 11.1  02/22/2017 1049   CREATININE 1.0 02/22/2017 1049      Component Value Date/Time   CALCIUM 9.3 02/22/2017 1049   ALKPHOS 123 02/22/2017 1049   AST 9 02/22/2017 1049   ALT <6 02/22/2017 1049   BILITOT 0.38 02/22/2017 1049      ASSESSMENT & PLAN:  Cancer of upper lobe of left lung, Adenocarcinoma She tolerated treatment poorly with diarrhea and poor appetite I will reduce the dose of treatment and continue aggressive supportive care Her CT imaging is scheduled for tomorrow If scans show favorable result, she will continue treatment If CT scan show progression of disease, there is very limited options of what we can do moving forward We will keep her appointment assess for discussion next week  Alopecia 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 She is currently not interested to quit now.  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   No orders of the defined types were placed in this encounter.  All questions were answered. The patient knows to call the clinic with any problems, questions or concerns. No barriers to learning was detected. I spent 20 minutes counseling the patient face to face. The total time spent in the appointment was 30 minutes and more than 50% was on counseling and review of test results     Heath Lark, MD 02/22/2017 12:15 PM

## 2017-02-23 ENCOUNTER — Telehealth: Payer: Self-pay | Admitting: Hematology and Oncology

## 2017-02-23 ENCOUNTER — Ambulatory Visit (HOSPITAL_COMMUNITY)
Admission: RE | Admit: 2017-02-23 | Discharge: 2017-02-23 | Disposition: A | Discharge status: Home / Self Care | Payer: Medicare HMO | Source: Ambulatory Visit | Attending: Hematology and Oncology | Admitting: Hematology and Oncology

## 2017-02-23 ENCOUNTER — Telehealth: Payer: Self-pay | Admitting: *Deleted

## 2017-02-23 DIAGNOSIS — R590 Localized enlarged lymph nodes: Secondary | ICD-10-CM | POA: Insufficient documentation

## 2017-02-23 DIAGNOSIS — Z9889 Other specified postprocedural states: Secondary | ICD-10-CM | POA: Diagnosis not present

## 2017-02-23 DIAGNOSIS — R918 Other nonspecific abnormal finding of lung field: Secondary | ICD-10-CM | POA: Diagnosis not present

## 2017-02-23 DIAGNOSIS — J439 Emphysema, unspecified: Secondary | ICD-10-CM | POA: Insufficient documentation

## 2017-02-23 DIAGNOSIS — C3490 Malignant neoplasm of unspecified part of unspecified bronchus or lung: Secondary | ICD-10-CM | POA: Diagnosis not present

## 2017-02-23 DIAGNOSIS — R911 Solitary pulmonary nodule: Secondary | ICD-10-CM | POA: Diagnosis not present

## 2017-02-23 DIAGNOSIS — I700 Atherosclerosis of aorta: Secondary | ICD-10-CM | POA: Insufficient documentation

## 2017-02-23 MED ORDER — IOPAMIDOL (ISOVUE-300) INJECTION 61%
75.0000 mL | Freq: Once | INTRAVENOUS | Status: AC | PRN
  Administered 2017-02-23: 75 mL via INTRAVENOUS

## 2017-02-23 MED ORDER — IOPAMIDOL (ISOVUE-300) INJECTION 61%
INTRAVENOUS | Status: AC
  Filled 2017-02-23: qty 75

## 2017-02-23 NOTE — Telephone Encounter (Signed)
-----   Message from Heath Lark, MD sent at 02/23/2017  4:15 PM EDT ----- Regarding: Ct result is good Pls let her know CT result is good Keep all her appt as is ----- Message ----- From: Interface, Rad Results In Sent: 02/23/2017   3:46 PM To: Heath Lark, MD

## 2017-02-23 NOTE — Telephone Encounter (Signed)
Per 10/8 los - Return for No new orders or return visit.

## 2017-02-23 NOTE — Telephone Encounter (Signed)
Notified of message below. Verbalized understanding 

## 2017-03-01 ENCOUNTER — Ambulatory Visit (HOSPITAL_BASED_OUTPATIENT_CLINIC_OR_DEPARTMENT_OTHER): Payer: Medicare HMO

## 2017-03-01 ENCOUNTER — Telehealth: Payer: Self-pay | Admitting: Hematology and Oncology

## 2017-03-01 ENCOUNTER — Encounter: Payer: Self-pay | Admitting: Hematology and Oncology

## 2017-03-01 ENCOUNTER — Ambulatory Visit (HOSPITAL_BASED_OUTPATIENT_CLINIC_OR_DEPARTMENT_OTHER): Payer: Medicare HMO | Admitting: Hematology and Oncology

## 2017-03-01 ENCOUNTER — Other Ambulatory Visit (HOSPITAL_BASED_OUTPATIENT_CLINIC_OR_DEPARTMENT_OTHER): Payer: Medicare HMO

## 2017-03-01 ENCOUNTER — Ambulatory Visit: Payer: Medicare HMO

## 2017-03-01 DIAGNOSIS — R530 Neoplastic (malignant) related fatigue: Secondary | ICD-10-CM | POA: Diagnosis not present

## 2017-03-01 DIAGNOSIS — C3412 Malignant neoplasm of upper lobe, left bronchus or lung: Secondary | ICD-10-CM | POA: Diagnosis not present

## 2017-03-01 DIAGNOSIS — C7949 Secondary malignant neoplasm of other parts of nervous system: Secondary | ICD-10-CM

## 2017-03-01 DIAGNOSIS — T451X5A Adverse effect of antineoplastic and immunosuppressive drugs, initial encounter: Secondary | ICD-10-CM

## 2017-03-01 DIAGNOSIS — R600 Localized edema: Secondary | ICD-10-CM

## 2017-03-01 DIAGNOSIS — D61818 Other pancytopenia: Secondary | ICD-10-CM | POA: Diagnosis not present

## 2017-03-01 DIAGNOSIS — Z7189 Other specified counseling: Secondary | ICD-10-CM

## 2017-03-01 DIAGNOSIS — Z5111 Encounter for antineoplastic chemotherapy: Secondary | ICD-10-CM

## 2017-03-01 DIAGNOSIS — G620 Drug-induced polyneuropathy: Secondary | ICD-10-CM | POA: Diagnosis not present

## 2017-03-01 DIAGNOSIS — C7931 Secondary malignant neoplasm of brain: Secondary | ICD-10-CM

## 2017-03-01 LAB — CBC WITH DIFFERENTIAL/PLATELET
BASO%: 0.6 % (ref 0.0–2.0)
Basophils Absolute: 0 10*3/uL (ref 0.0–0.1)
EOS%: 1.2 % (ref 0.0–7.0)
Eosinophils Absolute: 0 10*3/uL (ref 0.0–0.5)
HEMATOCRIT: 36.3 % (ref 34.8–46.6)
HEMOGLOBIN: 11.1 g/dL — AB (ref 11.6–15.9)
LYMPH#: 0.6 10*3/uL — AB (ref 0.9–3.3)
LYMPH%: 17.7 % (ref 14.0–49.7)
MCH: 24.8 pg — ABNORMAL LOW (ref 25.1–34.0)
MCHC: 30.6 g/dL — AB (ref 31.5–36.0)
MCV: 81.2 fL (ref 79.5–101.0)
MONO#: 0.2 10*3/uL (ref 0.1–0.9)
MONO%: 6.2 % (ref 0.0–14.0)
NEUT%: 74.3 % (ref 38.4–76.8)
NEUTROS ABS: 2.4 10*3/uL (ref 1.5–6.5)
Platelets: 151 10*3/uL (ref 145–400)
RBC: 4.47 10*6/uL (ref 3.70–5.45)
RDW: 20.3 % — ABNORMAL HIGH (ref 11.2–14.5)
WBC: 3.2 10*3/uL — AB (ref 3.9–10.3)
nRBC: 0 % (ref 0–0)

## 2017-03-01 LAB — COMPREHENSIVE METABOLIC PANEL
ALBUMIN: 3.1 g/dL — AB (ref 3.5–5.0)
ALT: 6 U/L (ref 0–55)
AST: 12 U/L (ref 5–34)
Alkaline Phosphatase: 112 U/L (ref 40–150)
Anion Gap: 6 mEq/L (ref 3–11)
BUN: 13.2 mg/dL (ref 7.0–26.0)
CHLORIDE: 104 meq/L (ref 98–109)
CO2: 27 mEq/L (ref 22–29)
CREATININE: 1.1 mg/dL (ref 0.6–1.1)
Calcium: 9 mg/dL (ref 8.4–10.4)
EGFR: >60 mL/min/{1.73_m2} (ref >60)
GLUCOSE: 108 mg/dL (ref 70–140)
POTASSIUM: 3.4 meq/L — AB (ref 3.5–5.1)
SODIUM: 137 meq/L (ref 136–145)
Total Bilirubin: 0.59 mg/dL (ref 0.20–1.20)
Total Protein: 6.7 g/dL (ref 6.4–8.3)

## 2017-03-01 MED ORDER — PALONOSETRON HCL INJECTION 0.25 MG/5ML
INTRAVENOUS | Status: AC
  Filled 2017-03-01: qty 5

## 2017-03-01 MED ORDER — PRAVASTATIN SODIUM 40 MG PO TABS: 40.0000 mg | ORAL_TABLET | Freq: Every day | ORAL | 3 refills | Status: DC

## 2017-03-01 MED ORDER — OMEPRAZOLE 40 MG PO CPDR: 40.0000 mg | DELAYED_RELEASE_CAPSULE | Freq: Every day | ORAL | 1 refills | Status: DC

## 2017-03-01 MED ORDER — DEXAMETHASONE SODIUM PHOSPHATE 10 MG/ML IJ SOLN
10.0000 mg | Freq: Once | INTRAMUSCULAR | Status: AC
  Administered 2017-03-01: 10 mg via INTRAVENOUS

## 2017-03-01 MED ORDER — SODIUM CHLORIDE 0.9% FLUSH
10.0000 mL | INTRAVENOUS | Status: DC | PRN
  Administered 2017-03-01: 10 mL
  Filled 2017-03-01: qty 10

## 2017-03-01 MED ORDER — DEXAMETHASONE SODIUM PHOSPHATE 10 MG/ML IJ SOLN
INTRAMUSCULAR | Status: AC
  Filled 2017-03-01: qty 1

## 2017-03-01 MED ORDER — PALONOSETRON HCL INJECTION 0.25 MG/5ML
0.2500 mg | Freq: Once | INTRAVENOUS | Status: AC
  Administered 2017-03-01: 0.25 mg via INTRAVENOUS

## 2017-03-01 MED ORDER — SODIUM CHLORIDE 0.9 % IV SOLN
64.0000 mg/m2 | Freq: Once | INTRAVENOUS | Status: AC
  Administered 2017-03-01: 114 mg via INTRAVENOUS
  Filled 2017-03-01: qty 19

## 2017-03-01 MED ORDER — HEPARIN SOD (PORK) LOCK FLUSH 100 UNIT/ML IV SOLN
500.0000 [IU] | Freq: Once | INTRAVENOUS | Status: AC | PRN
  Administered 2017-03-01: 500 [IU]
  Filled 2017-03-01: qty 5

## 2017-03-01 MED ORDER — BYSTOLIC 10 MG PO TABS: 10.0000 mg | ORAL_TABLET | Freq: Every day | ORAL | 1 refills | Status: DC

## 2017-03-01 MED ORDER — DIPHENHYDRAMINE HCL 50 MG/ML IJ SOLN
INTRAMUSCULAR | Status: AC
  Filled 2017-03-01: qty 1

## 2017-03-01 MED ORDER — SODIUM CHLORIDE 0.9 % IV SOLN
Freq: Once | INTRAVENOUS | Status: AC
  Administered 2017-03-01: 13:00:00 via INTRAVENOUS

## 2017-03-01 MED ORDER — DIPHENHYDRAMINE HCL 50 MG/ML IJ SOLN
50.0000 mg | Freq: Once | INTRAMUSCULAR | Status: AC
  Administered 2017-03-01: 50 mg via INTRAVENOUS

## 2017-03-01 MED ORDER — FAMOTIDINE IN NACL 20-0.9 MG/50ML-% IV SOLN
INTRAVENOUS | Status: AC
  Filled 2017-03-01: qty 50

## 2017-03-01 MED ORDER — FAMOTIDINE IN NACL 20-0.9 MG/50ML-% IV SOLN
20.0000 mg | Freq: Once | INTRAVENOUS | Status: AC
  Administered 2017-03-01: 20 mg via INTRAVENOUS

## 2017-03-01 NOTE — Telephone Encounter (Signed)
Gave avs and calendar for november

## 2017-03-01 NOTE — Patient Instructions (Signed)
Implanted Port Home Guide An implanted port is a type of central line that is placed under the skin. Central lines are used to provide IV access when treatment or nutrition needs to be given through a person's veins. Implanted ports are used for long-term IV access. An implanted port may be placed because:  You need IV medicine that would be irritating to the small veins in your hands or arms.  You need long-term IV medicines, such as antibiotics.  You need IV nutrition for a long period.  You need frequent blood draws for lab tests.  You need dialysis.  Implanted ports are usually placed in the chest area, but they can also be placed in the upper arm, the abdomen, or the leg. An implanted port has two main parts:  Reservoir. The reservoir is round and will appear as a small, raised area under your skin. The reservoir is the part where a needle is inserted to give medicines or draw blood.  Catheter. The catheter is a thin, flexible tube that extends from the reservoir. The catheter is placed into a large vein. Medicine that is inserted into the reservoir goes into the catheter and then into the vein.  How will I care for my incision site? Do not get the incision site wet. Bathe or shower as directed by your health care provider. How is my port accessed? Special steps must be taken to access the port:  Before the port is accessed, a numbing cream can be placed on the skin. This helps numb the skin over the port site.  Your health care provider uses a sterile technique to access the port. ? Your health care provider must put on a mask and sterile gloves. ? The skin over your port is cleaned carefully with an antiseptic and allowed to dry. ? The port is gently pinched between sterile gloves, and a needle is inserted into the port.  Only "non-coring" port needles should be used to access the port. Once the port is accessed, a blood return should be checked. This helps ensure that the port  is in the vein and is not clogged.  If your port needs to remain accessed for a constant infusion, a clear (transparent) bandage will be placed over the needle site. The bandage and needle will need to be changed every week, or as directed by your health care provider.  Keep the bandage covering the needle clean and dry. Do not get it wet. Follow your health care provider's instructions on how to take a shower or bath while the port is accessed.  If your port does not need to stay accessed, no bandage is needed over the port.  What is flushing? Flushing helps keep the port from getting clogged. Follow your health care provider's instructions on how and when to flush the port. Ports are usually flushed with saline solution or a medicine called heparin. The need for flushing will depend on how the port is used.  If the port is used for intermittent medicines or blood draws, the port will need to be flushed: ? After medicines have been given. ? After blood has been drawn. ? As part of routine maintenance.  If a constant infusion is running, the port may not need to be flushed.  How long will my port stay implanted? The port can stay in for as long as your health care provider thinks it is needed. When it is time for the port to come out, surgery will be   done to remove it. The procedure is similar to the one performed when the port was put in. When should I seek immediate medical care? When you have an implanted port, you should seek immediate medical care if:  You notice a bad smell coming from the incision site.  You have swelling, redness, or drainage at the incision site.  You have more swelling or pain at the port site or the surrounding area.  You have a fever that is not controlled with medicine.  This information is not intended to replace advice given to you by your health care provider. Make sure you discuss any questions you have with your health care provider. Document  Released: 05/04/2005 Document Revised: 10/10/2015 Document Reviewed: 01/09/2013 Elsevier Interactive Patient Education  2017 Elsevier Inc.  

## 2017-03-01 NOTE — Assessment & Plan Note (Signed)
Pancytopenia is due to chemo She is not symptomatic from anemia or thrombocytopenia. She received 1 dose of G-CSF support in August I recommend holding off further growth factor support for now

## 2017-03-01 NOTE — Assessment & Plan Note (Signed)
She has been prescribed gabapentin as needed and along with narcotic prescription as needed She will receive reduced dose treatment

## 2017-03-01 NOTE — Assessment & Plan Note (Signed)
She has mild leg swelling and was recently started on low-dose Lasix She have lost some fluid weight and her leg swelling has improved I recommend holding off further Lasix doses

## 2017-03-01 NOTE — Progress Notes (Signed)
[place] OFFICE PROGRESS NOTE  Patient Care Team: McDiarmid, Blane Ohara, MD as PCP - General (Family Medicine) Gaye Pollack, MD (Cardiothoracic Surgery) Dickie La, MD (Family Medicine) Heath Lark, MD as Consulting Physician (Hematology and Oncology) Pleasant, Eppie Gibson, RN as [place], [place], [place] (Optometry) Milus Banister, MD as Consulting Physician (Gastroenterology) Melida Quitter, MD as Consulting Physician (Otolaryngology) Standley Brooking, LCSW as [place] [facility] (Licensed Clinical Social Worker)  SUMMARY OF ONCOLOGIC HISTORY: Oncology History   Colon cancer   Primary site: Colon and Rectum (Left)   Staging method: AJCC 7th Edition   Clinical: Stage I (T2, N0, M0) signed by Heath Lark, MD on 05/31/2013  2:39 PM   Pathologic: Stage I (T2, N0, cM0) signed by Heath Lark, MD on 05/31/2013  2:39 PM   Summary: Stage I (T2, N0, cM0) Lung cancer, EGFR/ALK negative, recurrence after initial resection to LN and brain   Primary site: Lung (Left)   Staging method: AJCC 7th Edition   Clinical: Stage IV (T1, N2, M1b) signed by Heath Lark, MD on 05/31/2013  2:26 PM   Pathologic: Stage IV (T1, N2, M1b) signed by Heath Lark, MD on 05/31/2013  2:26 PM   Summary: Stage IV (T1, N2, M1b)       Cancer of upper lobe of left lung, Adenocarcinoma   03/01/2007 Procedure    Colonoscopy revealed abnormalities and biopsy show high-grade dysplasia      04/01/2007 Surgery    She underwent sigmoid resection which showed T2 N0 colon cancer, and negative margins and all of 17 lymph nodes were negative      02/29/2008 Procedure    Repeat surveillance colonoscopy was negative.      06/16/2010 Surgery    She underwent left upper lobectomy we show well-differentiated adenocarcinoma of the lung, T1, N0, M0      03/18/2011 Procedure    Repeat colonoscopy show multiple polyps but there were benign      10/19/2011  Procedure    Biopsy of mediastinal lymph node came back positive for recurrence of lung cancer, EGFR and ALK negative      11/16/2011 - 12/14/2011 Chemotherapy    She received concurrent chemoradiation therapy with weekly carboplatin and Taxol.      11/16/2011 - 12/29/2011 Radiation Therapy    She received radiation therapy with weekly chemotherapy      02/15/2012 Imaging    MR of the brain showed a new intracranial metastases. This was subsequently treated with stereotactic radiosurgery.      03/07/2012 - 04/12/2013 Chemotherapy    She received chemotherapy with maintainence Alimta every 3 weeks. Chemotherapy was discontinued due to profound fatigue      03/02/2013 Procedure    She had therapeutic ultrasound guidance thoracentesis for pleural effusion that came back negative for cancer      05/04/2013 Procedure    She had repeat ultrasound-guided thoracentesis again and cytology was negative      05/29/2013 Imaging    Repeat CT scan of the chest, abdomen and pelvis show no evidence of disease but persistent right-sided pleural effusion      06/02/2013 Surgery    The patient had placement of Pleurx catheter and subsequently underwent pleurodesis.      09/22/2013 Imaging    Repeat CT scan show no evidence of active disease. There are nonspecific lymphadenopathy and she is placed on observation.      03/23/2014 Imaging  Repeat CT scan of the chest, abdomen and pelvis show recurrence of cancer with widespread bilateral pulmonary metastasis.      05/14/2014 Imaging    Imaging of the chest and brain were repeated due to delay of initiation of chemotherapy. Overall, chest CT scan show stable disease. MRI of the head was negative for recurrence      05/16/2014 - 07/18/2014 Chemotherapy     she completed 4 cycles of combination chemotherapy with carboplatin and Alimta      07/16/2014 Imaging     repeat CT scan of the chest, abdomen and pelvis show regression in the size of lung  nodules.      08/29/2014 - 08/14/2015 Chemotherapy    She received maintenance Alimta      10/16/2014 Imaging     repeat CT scan of the chest, abdomen and pelvis show regression in the size of lung nodules.      01/29/2015 Imaging    Repeat CT scan showed stable disease      02/01/2015 Imaging    MRI brain is negative      04/30/2015 Imaging    CT scan of the chest, abdomen and pelvis showed stable disease      07/25/2015 Imaging    MRI brain showed no evidence of new disease      09/09/2015 Imaging    CT chest showed interval increase in size of large RIGHT upper lobe nodule is most consistent with lung cancer recurrence.      09/18/2015 - 03/26/2016 Chemotherapy    She started on Nivolumab      10/22/2015 Imaging    Screening mammogram showed mild abnormality      10/31/2015 Imaging    Diagnostic mammogram showed mild calcification at the right upper outer breast      11/27/2015 Imaging    Stable post treatment related changes of left lower lobectomy and radiation therapy redemonstrated, as above. No definite findings to suggest local recurrence of disease or metastatic disease on today's examination      01/24/2016 Imaging    MR brain showed continued stable appearance of two small treated brain metastases. No new or progressive metastatic disease to the brain.      04/15/2016 Imaging    Ct scan showed findings highly suspicious for recurrent tumor in the mediastinum surrounding the right mainstem bronchus and in the region of the azygoesophageal recess. Diffuse pulmonary metastatic disease with new and progressive nodules since the prior examination.      04/24/2016 - 12/18/2016 Chemotherapy    The patient had chemotherapy with gemzar and Carboplatin      04/30/2016 Imaging    Decreased contrast enhancement of the two treated metastases in the right frontal and left occipital lobes. No new lesions.      06/01/2016 Imaging    CXR showed numerous metastatic nodules have  progressed compared to the most recent comparison chest x-ray of 07/15/2015. Majority of these were noted on the 04/15/2016 chest CT. No segmental infiltrate or pulmonary edema.      06/19/2016 Adverse Reaction    Treatment is placed on hold today due to neutropenia and the patient not feeling well      06/29/2016 Imaging    Interval decrease in size of metastatic pulmonary nodules. 2. Persistent but slightly improved right hilar/mediastinal disease. No progressive findings. 3. No findings for upper abdominal metastatic disease      10/01/2016 Imaging    Mild increase in size of 2.5 cm central right  lower lobe pulmonary nodule, consistent with metastatic disease. Numerous other bilateral sub-cm pulmonary nodules are stable.  Stable mild subcarinal and right cardiophrenic angle lymphadenopathy. Stable emphysema and right lung radiation changes. Stable hepatic steatosis. Aortic and coronary artery atherosclerosis.      11/06/2016 Imaging    MR brain: Stable treated RIGHT insula and LEFT occipital metastases.      12/30/2016 Imaging    1. Bilateral pulmonary nodules are minimally progressive from 10/01/2016. Dominant nodule in the superior segment right lower lobe is stable. 2. Aortic atherosclerosis (ICD10-170.0). Coronary artery calcification.      01/08/2017 - 01/08/2017 Chemotherapy    She received 1 dose of Abraxane, discontinued because of insurance coverage      02/23/2017 Imaging    Mild decrease in size of dominant pulmonary nodule in the central right lower lobe. Otherwise stable sub-cm bilateral pulmonary nodules and mild mediastinal lymphadenopathy. No new or progressive disease identified.  Stable right lung post radiation changes.  Aortic Atherosclerosis (ICD10-I70.0) and Emphysema (ICD10-J43.9).       Brain metastases treated with radiosurgery   02/26/2012 Initial Diagnosis    Brain metastases treated with radiosurgery       INTERVAL HISTORY: Please see below for  problem oriented charting. She returns to review test results She is doing well She has lost some weight since she started on some Lasix Appetite is stable She complain of fatigue She is still smoking with chronic cough She complain of mild worsening peripheral neuropathy recently  REVIEW OF SYSTEMS:   Constitutional: Denies fevers, chills or abnormal weight loss Eyes: Denies blurriness of vision Ears, nose, mouth, throat, and face: Denies mucositis or sore throat Cardiovascular: Denies palpitation, chest discomfort  Gastrointestinal:  Denies nausea, heartburn or change in bowel habits Skin: Denies abnormal skin rashes Lymphatics: Denies new lymphadenopathy or easy bruising Neurological:Denies numbness, tingling or new weaknesses Behavioral/Psych: Mood is stable, no new changes  All other systems were reviewed with the patient and are negative.  I have reviewed the past medical history, past surgical history, social history and family history with the patient and they are unchanged from previous note.  ALLERGIES:  is allergic to adhesive [tape] and lisinopril.  MEDICATIONS:  Current Outpatient Prescriptions  Medication Sig Dispense Refill  . aspirin EC 81 MG tablet Take 81 mg by mouth daily.    Marland Kitchen BYSTOLIC 10 MG tablet Take 1 tablet (10 mg total) by mouth daily. 90 tablet 1  . diclofenac sodium (VOLTAREN) 1 % GEL Apply 2 g topically 4 (four) times daily as needed. (Patient taking differently: Apply 2 g topically 4 (four) times daily as needed (pain). ) 100 g 1  . diphenoxylate-atropine (LOMOTIL) 2.5-0.025 MG tablet Take 1 tablet by mouth 4 (four) times daily as needed for diarrhea or loose stools. 30 tablet 0  . furosemide (LASIX) 20 MG tablet Take 1 tablet (20 mg total) by mouth daily. 30 tablet 1  . HYDROmorphone (DILAUDID) 8 MG tablet Take 1 tablet (8 mg total) by mouth every 6 (six) hours as needed for severe pain. 60 tablet 0  . ipratropium (ATROVENT HFA) 17 MCG/ACT inhaler  Inhale 2 puffs into the lungs every 4 (four) hours as needed for wheezing (Cough). 3 Inhaler 3  . loperamide (IMODIUM A-D) 2 MG tablet Take 2 mg by mouth 4 (four) times daily as needed for diarrhea or loose stools.    Marland Kitchen loratadine (CLARITIN) 10 MG tablet Take 10 mg by mouth daily.    Marland Kitchen LORazepam (ATIVAN)  0.5 MG tablet Take 1 tablet (0.5 mg total) by mouth every 8 (eight) hours as needed for anxiety. 60 tablet 1  . mirtazapine (REMERON) 15 MG tablet Take 1 tablet (15 mg total) by mouth at bedtime. 30 tablet 11  . nitroGLYCERIN (NITROSTAT) 0.4 MG SL tablet Place 1 tablet (0.4 mg total) under the tongue every 5 (five) minutes as needed. For chest pain (Patient not taking: Reported on 01/27/2017) 25 tablet 5  . omeprazole (PRILOSEC) 40 MG capsule Take 1 capsule (40 mg total) by mouth daily. 90 capsule 1  . ondansetron (ZOFRAN ODT) 4 MG disintegrating tablet Take 1 tablet (4 mg total) by mouth every 8 (eight) hours as needed for nausea or vomiting. 20 tablet 0  . pravastatin (PRAVACHOL) 40 MG tablet Take 1 tablet (40 mg total) by mouth daily. 90 tablet 3  . prochlorperazine (COMPAZINE) 10 MG tablet Take 1 tablet (10 mg total) by mouth every 6 (six) hours as needed for nausea or vomiting. (Patient not taking: Reported on 01/27/2017) 30 tablet 1  . umeclidinium-vilanterol (ANORO ELLIPTA) 62.5-25 MCG/INH AEPB Inhale 1 puff into the lungs daily. 3 each 3  . VENTOLIN HFA 108 (90 Base) MCG/ACT inhaler Inhale 2 puffs into the lungs every 4 (four) hours as needed for wheezing or shortness of breath. 3 Inhaler 3   No current facility-administered medications for this visit.    Facility-Administered Medications Ordered in Other Visits  Medication Dose Route Frequency Provider Last Rate Last Dose  . famotidine (PEPCID) IVPB 20 mg premix  20 mg Intravenous Once Alvy Bimler, Odus Clasby, MD      . heparin lock flush 100 unit/mL  500 Units Intracatheter Once PRN Alvy Bimler, Jasmen Emrich, MD      . PACLitaxel (TAXOL) 114 mg in sodium chloride  0.9 % 250 mL chemo infusion (</= '80mg'$ /m2)  64 mg/m2 (Treatment Plan Recorded) Intravenous Once Breckan Cafiero, MD      . sodium chloride 0.9 % injection 10 mL  10 mL Intravenous PRN Alvy Bimler, Tiwana Chavis, MD   10 mL at 09/18/15 1345  . sodium chloride 0.9 % injection 10 mL  10 mL Intravenous PRN Alvy Bimler, Jammi Morrissette, MD   10 mL at 04/24/16 1136  . sodium chloride 0.9 % injection 10 mL  10 mL Intravenous PRN Alvy Bimler, Kattleya Kuhnert, MD   10 mL at 01/15/17 1322  . sodium chloride flush (NS) 0.9 % injection 10 mL  10 mL Intracatheter PRN Alvy Bimler, Mike Berntsen, MD        PHYSICAL EXAMINATION: ECOG PERFORMANCE STATUS: 1 - Symptomatic but completely ambulatory  Vitals:   03/01/17 1123  BP: 133/66  Pulse: 84  Resp: 16  Temp: 97.7 F (36.5 C)  SpO2: 99%   Filed Weights   03/01/17 1123  Weight: 153 lb 4.8 oz (69.5 kg)    GENERAL:alert, no distress and comfortable SKIN: skin color, texture, turgor are normal, no rashes or significant lesions EYES: normal, Conjunctiva are pink and non-injected, sclera clear OROPHARYNX:no exudate, no erythema and lips, buccal mucosa, and tongue normal  NECK: supple, thyroid normal size, non-tender, without nodularity LYMPH:  no palpable lymphadenopathy in the cervical, axillary or inguinal LUNGS: clear to auscultation and percussion with normal breathing effort HEART: regular rate & rhythm and no murmurs and no lower extremity edema ABDOMEN:abdomen soft, non-tender and normal bowel sounds Musculoskeletal:no cyanosis of digits and no clubbing  NEURO: alert & oriented x 3 with fluent speech, no focal motor/sensory deficits  LABORATORY DATA:  I have reviewed the data as listed  Component Value Date/Time   NA 137 03/01/2017 1103   K 3.4 (L) 03/01/2017 1103   CL 107 01/25/2017 1743   CL 102 10/24/2012 1001   CO2 27 03/01/2017 1103   GLUCOSE 108 03/01/2017 1103   GLUCOSE 111 (H) 10/24/2012 1001   BUN 13.2 03/01/2017 1103   CREATININE 1.1 03/01/2017 1103   CALCIUM 9.0 03/01/2017 1103   PROT  6.7 03/01/2017 1103   ALBUMIN 3.1 (L) 03/01/2017 1103   AST 12 03/01/2017 1103   ALT 6 03/01/2017 1103   ALKPHOS 112 03/01/2017 1103   BILITOT 0.59 03/01/2017 1103   GFRNONAA >60 01/25/2017 1743   GFRAA >60 01/25/2017 1743    No results found for: SPEP, UPEP  Lab Results  Component Value Date   WBC 3.2 (L) 03/01/2017   NEUTROABS 2.4 03/01/2017   HGB 11.1 (L) 03/01/2017   HCT 36.3 03/01/2017   MCV 81.2 03/01/2017   PLT 151 03/01/2017      Chemistry      Component Value Date/Time   NA 137 03/01/2017 1103   K 3.4 (L) 03/01/2017 1103   CL 107 01/25/2017 1743   CL 102 10/24/2012 1001   CO2 27 03/01/2017 1103   BUN 13.2 03/01/2017 1103   CREATININE 1.1 03/01/2017 1103      Component Value Date/Time   CALCIUM 9.0 03/01/2017 1103   ALKPHOS 112 03/01/2017 1103   AST 12 03/01/2017 1103   ALT 6 03/01/2017 1103   BILITOT 0.59 03/01/2017 1103       RADIOGRAPHIC STUDIES: I have personally reviewed the radiological images as listed and agreed with the findings in the report. Ct Chest W Contrast  Result Date: 02/23/2017 CLINICAL DATA:  Followup right lung non-small cell carcinoma. Ongoing chemotherapy. Personal history of colon carcinoma. EXAM: CT CHEST WITH CONTRAST TECHNIQUE: Multidetector CT imaging of the chest was performed during intravenous contrast administration. CONTRAST:  22mL ISOVUE-300 IOPAMIDOL (ISOVUE-300) INJECTION 61% COMPARISON:  12/30/2016 FINDINGS: Cardiovascular: No acute findings. Aortic and coronary artery atherosclerosis. Mediastinum/Nodes: Mild mediastinal lymphadenopathy in the subcarinal region measures 1.7 cm on image 56/2, without significant change since previous study. No other sites of lymphadenopathy identified within the thorax. Lungs/Pleura: Stable mild emphysema. Stable post radiation changes in right paramediastinal lung zone. Pulmonary nodule in the central right lower lobe just posterior to the right lower lobe bronchus has decreased in size,  currently measuring 2.3 cm on image 59/7 compared to 2.8 cm previously. This nodule also shows increased cavitation peripherally. 9 mm pulmonary nodules in anterior right middle lobe on image 65/7, without change. 8 mm pulmonary nodule in the anterior left upper lobe on image 35/7 also shows no significant change in size. There are other scattered less than 5 mm pulmonary nodule seen bilaterally which show no significant change. No evidence of pleural effusion. High attenuation is seen in the right posterior pleural space is consistent with previous talc pleurodesis. Upper Abdomen:  Unremarkable. Musculoskeletal:  No suspicious bone lesions. IMPRESSION: Mild decrease in size of dominant pulmonary nodule in the central right lower lobe. Otherwise stable sub-cm bilateral pulmonary nodules and mild mediastinal lymphadenopathy. No new or progressive disease identified. Stable right lung post radiation changes. Aortic Atherosclerosis (ICD10-I70.0) and Emphysema (ICD10-J43.9). Electronically Signed   By: Myles Rosenthal M.D.   On: 02/23/2017 15:44    ASSESSMENT & PLAN:  Cancer of upper lobe of left lung, Adenocarcinoma She tolerated treatment poorly with diarrhea and poor appetite I will reduce the dose of treatment and continue  aggressive supportive care Repeat CT imaging showed stable disease with positive response to treatment We have a long discussion regarding dosing of treatment Ultimately, the patient wants to keep on weekly schedule, to be treated on days 1, 8, 15 and rest day 22 I plan to reduce the dose of treatment due to peripheral neuropathy  Neuropathy due to chemotherapeutic drug ([place]) She has been prescribed gabapentin as needed and along with narcotic prescription as needed She will receive reduced dose treatment  Pancytopenia, acquired ([place]) Pancytopenia is due to chemo She is not symptomatic from anemia or thrombocytopenia. She received 1 dose of G-CSF support in August I recommend  holding off further growth factor support for now  Bilateral leg edema She has mild leg swelling and was recently started on low-dose Lasix She have lost some fluid weight and her leg swelling has improved I recommend holding off further Lasix doses   No orders of the defined types were placed in this encounter.  All questions were answered. The patient knows to call the clinic with any problems, questions or concerns. No barriers to learning was detected. I spent 20 minutes counseling the patient face to face. The total time spent in the appointment was 30 minutes and more than 50% was on counseling and review of test results     Heath Lark, MD 03/01/2017 1:30 PM

## 2017-03-01 NOTE — Patient Instructions (Signed)
[facility] Discharge Instructions for Patients Receiving Chemotherapy  Today you received the following chemotherapy agents:  Taxol.  To help prevent nausea and vomiting after your treatment, we encourage you to take your nausea medication as directed.   If you develop nausea and vomiting that is not controlled by your nausea medication, call the clinic.   BELOW ARE SYMPTOMS THAT SHOULD BE REPORTED IMMEDIATELY:  *FEVER GREATER THAN 100.5 F  *CHILLS WITH OR WITHOUT FEVER  NAUSEA AND VOMITING THAT IS NOT CONTROLLED WITH YOUR NAUSEA MEDICATION  *UNUSUAL SHORTNESS OF BREATH  *UNUSUAL BRUISING OR BLEEDING  TENDERNESS IN MOUTH AND THROAT WITH OR WITHOUT PRESENCE OF ULCERS  *URINARY PROBLEMS  *BOWEL PROBLEMS  UNUSUAL RASH Items with * indicate a potential emergency and should be followed up as soon as possible.  Feel free to call the clinic should you have any questions or concerns. The clinic phone number is [phone].  Please show the CHEMO ALERT CARD at check-in to the Emergency Department and triage nurse.   

## 2017-03-01 NOTE — Assessment & Plan Note (Signed)
She tolerated treatment poorly with diarrhea and poor appetite I will reduce the dose of treatment and continue aggressive supportive care Repeat CT imaging showed stable disease with positive response to treatment We have a long discussion regarding dosing of treatment Ultimately, the patient wants to keep on weekly schedule, to be treated on days 1, 8, 15 and rest day 22 I plan to reduce the dose of treatment due to peripheral neuropathy

## 2017-03-02 ENCOUNTER — Ambulatory Visit: Payer: Self-pay | Admitting: Hematology and Oncology

## 2017-03-04 ENCOUNTER — Ambulatory Visit: Payer: Self-pay | Admitting: *Deleted

## 2017-03-04 ENCOUNTER — Other Ambulatory Visit: Payer: Self-pay | Admitting: *Deleted

## 2017-03-04 NOTE — Patient Outreach (Signed)
[facility]) Care Management  03/04/2017  Lauren [name] 1950/12/07 [id number]   CSW called & spoke with patient to follow-up on resources. Patient reports that she has been getting meals from [employer] since Friday, October 5th and besides the carrots which cause her to have diarrhea, the meals have been good. Patient also reports that a wooden ramp has been put in to help with getting her in and out of her house. Patient reports that she was able to get a smaller shower chair, as the one she had from her sister was too big for the area. Patient also states that as she is grateful to have a rollator for when she's out of her house, it's difficult to use inside her house due to it's size. CSW will check around to see where they may have DME for free/discounted price for patient to get a rolling walker.    [name], LCSW [facility]  Clinical Social Worker cell #: [phone]

## 2017-03-11 ENCOUNTER — Encounter: Payer: Self-pay | Admitting: Family Medicine

## 2017-03-11 ENCOUNTER — Other Ambulatory Visit: Payer: Self-pay | Admitting: *Deleted

## 2017-03-11 ENCOUNTER — Ambulatory Visit (INDEPENDENT_AMBULATORY_CARE_PROVIDER_SITE_OTHER): Payer: Medicare HMO | Admitting: Family Medicine

## 2017-03-11 VITALS — BP 112/70 | HR 100 | Temp 98.3°F | Ht 64.0 in | Wt 152.0 lb

## 2017-03-11 DIAGNOSIS — C3412 Malignant neoplasm of upper lobe, left bronchus or lung: Secondary | ICD-10-CM

## 2017-03-11 DIAGNOSIS — Z711 Person with feared health complaint in whom no diagnosis is made: Secondary | ICD-10-CM | POA: Diagnosis not present

## 2017-03-11 NOTE — Patient Instructions (Signed)
You are an inspiration. I look forward to seeing you next visit.  If I can help, please call me.

## 2017-03-11 NOTE — Patient Outreach (Signed)
Lauren [name]) Care Management  03/11/2017   Lauren [name] 11/16/1950 [id number]  RN Health Coach telephone received return telephone call from patient.  Hip[employer] compliance verified. Per patient she is still having some diarrhea especially at chemo time. Per patient she has lost a couple pounds. Per patient she has lost all her hair. Patient stated the ramp has been put in. Patient informed that the social worker will bring a walker and the ensure to her tomorrow. Patient has discussed her feeling about dying and knows that it is imminent.  Patient is getting out some but not much due to the diarrhea. Patient appetite is fair. Patient has not had any recent falls. Patient uses a rollator outside of home but it is to big to use in the home. Patient has agreed to follow up outreach calls and knows she can call the health coach for additional support. Patient has not agreed to Hospice at this time.   Current Medications:  Current Outpatient Prescriptions  Medication Sig Dispense Refill  . aspirin EC 81 MG tablet Take 81 mg by mouth daily.    Marland Kitchen BYSTOLIC 10 MG tablet Take 1 tablet (10 mg total) by mouth daily. (Patient not taking: Reported on 03/11/2017) 90 tablet 1  . diclofenac sodium (VOLTAREN) 1 % GEL Apply 2 g topically 4 (four) times daily as needed. (Patient taking differently: Apply 2 g topically 4 (four) times daily as needed (pain). ) 100 g 1  . diphenoxylate-atropine (LOMOTIL) 2.5-0.025 MG tablet Take 1 tablet by mouth 4 (four) times daily as needed for diarrhea or loose stools. 30 tablet 0  . HYDROmorphone (DILAUDID) 8 MG tablet Take 1 tablet (8 mg total) by mouth every 6 (six) hours as needed for severe pain. 60 tablet 0  . ipratropium (ATROVENT HFA) 17 MCG/ACT inhaler Inhale 2 puffs into the lungs every 4 (four) hours as needed for wheezing (Cough). 3 Inhaler 3  . loratadine (CLARITIN) 10 MG tablet Take 10 mg by mouth daily.    Marland Kitchen LORazepam (ATIVAN) 0.5 MG tablet Take 1  tablet (0.5 mg total) by mouth every 8 (eight) hours as needed for anxiety. 60 tablet 1  . mirtazapine (REMERON) 15 MG tablet Take 1 tablet (15 mg total) by mouth at bedtime. 30 tablet 11  . nitroGLYCERIN (NITROSTAT) 0.4 MG SL tablet Place 1 tablet (0.4 mg total) under the tongue every 5 (five) minutes as needed. For chest pain (Patient not taking: Reported on 01/27/2017) 25 tablet 5  . omeprazole (PRILOSEC) 40 MG capsule Take 1 capsule (40 mg total) by mouth daily. (Patient not taking: Reported on 03/11/2017) 90 capsule 1  . ondansetron (ZOFRAN ODT) 4 MG disintegrating tablet Take 1 tablet (4 mg total) by mouth every 8 (eight) hours as needed for nausea or vomiting. (Patient not taking: Reported on 03/11/2017) 20 tablet 0  . pravastatin (PRAVACHOL) 40 MG tablet Take 1 tablet (40 mg total) by mouth daily. (Patient not taking: Reported on 03/11/2017) 90 tablet 3  . prochlorperazine (COMPAZINE) 10 MG tablet Take 1 tablet (10 mg total) by mouth every 6 (six) hours as needed for nausea or vomiting. (Patient not taking: Reported on 01/27/2017) 30 tablet 1  . traMADol (ULTRAM) 50 MG tablet Take 1 tablet (50 mg total) by mouth every 6 (six) hours as needed. 30 tablet 0  . umeclidinium-vilanterol (ANORO ELLIPTA) 62.5-25 MCG/INH AEPB Inhale 1 puff into the lungs daily. 3 each 3  . VENTOLIN HFA 108 (90 Base) MCG/ACT inhaler Inhale  2 puffs into the lungs every 4 (four) hours as needed for wheezing or shortness of breath. 3 Inhaler 3   No current facility-administered medications for this visit.    Facility-Administered Medications Ordered in Other Visits  Medication Dose Route Frequency Provider Last Rate Last Dose  . sodium chloride 0.9 % injection 10 mL  10 mL Intravenous PRN Alvy Bimler, Ni, MD   10 mL at 09/18/15 1345  . sodium chloride 0.9 % injection 10 mL  10 mL Intravenous PRN Alvy Bimler, Ni, MD   10 mL at 04/24/16 1136  . sodium chloride 0.9 % injection 10 mL  10 mL Intravenous PRN Heath Lark, MD   10 mL at  01/15/17 1322    Functional Status:  In your present state of health, do you have any difficulty performing the following activities: 03/11/2017 01/27/2017  Hearing? N N  Vision? Y Y  Difficulty concentrating or making decisions? [name]  Walking or climbing stairs? Y Y  Dressing or bathing? N N  Doing errands, shopping? [name]  Preparing Food and eating ? N N  Using the Toilet? N N  In the past six months, have you accidently leaked urine? Y Y  Do you have problems with loss of bowel control? N N  Managing your Medications? N N  Managing your Finances? N N  Housekeeping or managing your Housekeeping? Y Y  Some recent data might be hidden    Fall/Depression Screening: Fall Risk  03/11/2017 03/11/2017 01/27/2017  Falls in the past year? No No Yes  Number falls in past yr: 1 - 1  Comment - - -  Injury with Fall? No - No  Comment - - -  Risk Factor Category  High Fall Risk - -  Risk for fall due to : Impaired balance/gait;History of fall(s);Impaired mobility - Impaired balance/gait;Impaired mobility;History of fall(s)  Risk for fall due to: Comment - - -  Follow up Falls evaluation completed;Education provided;Falls prevention discussed - Falls evaluation completed;Education provided;Falls prevention discussed   PHQ 2/9 Scores 03/11/2017 03/11/2017 01/27/2017 01/01/2017 12/31/2016 12/10/2016 12/02/2016  PHQ - 2 Score 0 0 1 1 1  0 0  PHQ- 9 Score - - - - - - -  Exception Documentation - - - - - - -   THN CM Care Plan Problem One     Most Recent Value  Care Plan Problem One  Knowledge deficit in self management of COPD  Role Documenting the Problem One  Health [place] for Problem One  Active  THN Long Term Goal   Patient will not have any readmissions for COPD within the next 90 days  Interventions for Problem One Long Term Goal  RN reminded patient to keep appointments with PCP and oncology and pulmonary specialist. RN reminded patient to take medications as per order.  THN CM  Short Term Goal #2   Patient will verbalize she is communicating with counseling as needed  THN CM Short Term Goal #2 Start Date  03/11/17  Interventions for Short Term Goal #2  RN discussed with patient about counseling as she makes the end of life decisions. RN lets patient express her feelings and concerns.   THN CM Short Term Goal #3  Patient will verbalize taking medication as per ordered within the next 30 days  THN CM Short Term Goal #3 Start Date  03/11/17  Interventions for Short Tern Goal #3  RN discussed the importance of taking medications as per ordered. Rn went  over list of medications with patient . RN will follow up will further discussion      Assessment:  Patient has lost 2 pounds Appetite is fair Patient has some diarrhea during chemotherapy Patient followed up with PCP today  Plan:  Social worker will take ensure, walker and a pack of adult briefs to patient  tomorrow RN will follow up outreach in November RN discusses medication adherence  [place] Management [phone]

## 2017-03-11 NOTE — Progress Notes (Signed)
30 minutes face to face where spent in total with counseling. Counseling involved supportive counseling, discussion of her plans for future and EOL.  Lauren [name] has multiple coping methods including writing, talking with friends, and prayer.   All in all, she appears to have come to terms with her approaching loss with dignity.  I admire her.

## 2017-03-11 NOTE — Patient Outreach (Signed)
[facility]) Care Management  03/11/2017  KAWTHAR ENNEN [birth year] [id number]   RN Health Coach attempted #1 follow up outreach call to patient.  Patient was unavailable. HIPPA compliance voicemail message left with return callback number.  Plan: RN will call patient again within 14 days.  [place] Care Management [phone]

## 2017-03-15 ENCOUNTER — Ambulatory Visit (HOSPITAL_BASED_OUTPATIENT_CLINIC_OR_DEPARTMENT_OTHER): Payer: Medicare HMO

## 2017-03-15 ENCOUNTER — Other Ambulatory Visit (HOSPITAL_BASED_OUTPATIENT_CLINIC_OR_DEPARTMENT_OTHER): Payer: Medicare HMO

## 2017-03-15 ENCOUNTER — Ambulatory Visit: Payer: Medicare HMO

## 2017-03-15 VITALS — BP 142/85 | HR 99 | Temp 98.0°F | Resp 20

## 2017-03-15 DIAGNOSIS — Z5111 Encounter for antineoplastic chemotherapy: Secondary | ICD-10-CM | POA: Diagnosis not present

## 2017-03-15 DIAGNOSIS — Z7189 Other specified counseling: Secondary | ICD-10-CM

## 2017-03-15 DIAGNOSIS — C7931 Secondary malignant neoplasm of brain: Secondary | ICD-10-CM | POA: Diagnosis not present

## 2017-03-15 DIAGNOSIS — C7949 Secondary malignant neoplasm of other parts of nervous system: Secondary | ICD-10-CM

## 2017-03-15 DIAGNOSIS — C3412 Malignant neoplasm of upper lobe, left bronchus or lung: Secondary | ICD-10-CM

## 2017-03-15 LAB — CBC WITH DIFFERENTIAL/PLATELET
BASO%: 0.3 % (ref 0.0–2.0)
BASOS ABS: 0 10*3/uL (ref 0.0–0.1)
EOS ABS: 0 10*3/uL (ref 0.0–0.5)
EOS%: 0.9 % (ref 0.0–7.0)
HCT: 34.2 % — ABNORMAL LOW (ref 34.8–46.6)
HEMOGLOBIN: 10.4 g/dL — AB (ref 11.6–15.9)
LYMPH%: 18.2 % (ref 14.0–49.7)
MCH: 24.5 pg — AB (ref 25.1–34.0)
MCHC: 30.4 g/dL — AB (ref 31.5–36.0)
MCV: 80.5 fL (ref 79.5–101.0)
MONO#: 0.5 10*3/uL (ref 0.1–0.9)
MONO%: 14.9 % — AB (ref 0.0–14.0)
NEUT%: 65.7 % (ref 38.4–76.8)
NEUTROS ABS: 2.2 10*3/uL (ref 1.5–6.5)
PLATELETS: 154 10*3/uL (ref 145–400)
RBC: 4.25 10*6/uL (ref 3.70–5.45)
RDW: 20.5 % — ABNORMAL HIGH (ref 11.2–14.5)
WBC: 3.4 10*3/uL — AB (ref 3.9–10.3)
lymph#: 0.6 10*3/uL — ABNORMAL LOW (ref 0.9–3.3)

## 2017-03-15 LAB — COMPREHENSIVE METABOLIC PANEL
ALT: <6 U/L (ref 0–55)
ANION GAP: 7 meq/L (ref 3–11)
AST: 8 U/L (ref 5–34)
Albumin: 2.8 g/dL — ABNORMAL LOW (ref 3.5–5.0)
Alkaline Phosphatase: 104 U/L (ref 40–150)
BUN: 8.6 mg/dL (ref 7.0–26.0)
CO2: 26 meq/L (ref 22–29)
Calcium: 8.6 mg/dL (ref 8.4–10.4)
Chloride: 108 mEq/L (ref 98–109)
Creatinine: 0.9 mg/dL (ref 0.6–1.1)
GLUCOSE: 88 mg/dL (ref 70–140)
POTASSIUM: 3.5 meq/L (ref 3.5–5.1)
SODIUM: 140 meq/L (ref 136–145)
Total Bilirubin: 0.62 mg/dL (ref 0.20–1.20)
Total Protein: 6.2 g/dL — ABNORMAL LOW (ref 6.4–8.3)

## 2017-03-15 MED ORDER — HEPARIN SOD (PORK) LOCK FLUSH 100 UNIT/ML IV SOLN
500.0000 [IU] | Freq: Once | INTRAVENOUS | Status: AC | PRN
  Administered 2017-03-15: 500 [IU]
  Filled 2017-03-15: qty 5

## 2017-03-15 MED ORDER — FAMOTIDINE IN NACL 20-0.9 MG/50ML-% IV SOLN
20.0000 mg | Freq: Once | INTRAVENOUS | Status: AC
  Administered 2017-03-15: 20 mg via INTRAVENOUS

## 2017-03-15 MED ORDER — PALONOSETRON HCL INJECTION 0.25 MG/5ML
0.2500 mg | Freq: Once | INTRAVENOUS | Status: AC
  Administered 2017-03-15: 0.25 mg via INTRAVENOUS

## 2017-03-15 MED ORDER — SODIUM CHLORIDE 0.9 % IV SOLN
64.0000 mg/m2 | Freq: Once | INTRAVENOUS | Status: AC
  Administered 2017-03-15: 114 mg via INTRAVENOUS
  Filled 2017-03-15: qty 19

## 2017-03-15 MED ORDER — SODIUM CHLORIDE 0.9% FLUSH
10.0000 mL | INTRAVENOUS | Status: DC | PRN
  Administered 2017-03-15: 10 mL
  Filled 2017-03-15: qty 10

## 2017-03-15 MED ORDER — DIPHENHYDRAMINE HCL 50 MG/ML IJ SOLN
50.0000 mg | Freq: Once | INTRAMUSCULAR | Status: AC
  Administered 2017-03-15: 50 mg via INTRAVENOUS

## 2017-03-15 MED ORDER — DEXAMETHASONE SODIUM PHOSPHATE 10 MG/ML IJ SOLN
INTRAMUSCULAR | Status: AC
  Filled 2017-03-15: qty 1

## 2017-03-15 MED ORDER — DEXAMETHASONE SODIUM PHOSPHATE 10 MG/ML IJ SOLN
10.0000 mg | Freq: Once | INTRAMUSCULAR | Status: AC
  Administered 2017-03-15: 10 mg via INTRAVENOUS

## 2017-03-15 MED ORDER — PALONOSETRON HCL INJECTION 0.25 MG/5ML
INTRAVENOUS | Status: AC
  Filled 2017-03-15: qty 5

## 2017-03-15 MED ORDER — FAMOTIDINE IN NACL 20-0.9 MG/50ML-% IV SOLN
INTRAVENOUS | Status: AC
  Filled 2017-03-15: qty 50

## 2017-03-15 MED ORDER — SODIUM CHLORIDE 0.9 % IV SOLN
Freq: Once | INTRAVENOUS | Status: AC
  Administered 2017-03-15: 13:00:00 via INTRAVENOUS

## 2017-03-15 MED ORDER — DIPHENHYDRAMINE HCL 50 MG/ML IJ SOLN
INTRAMUSCULAR | Status: AC
  Filled 2017-03-15: qty 1

## 2017-03-15 NOTE — Patient Instructions (Signed)
[place] Discharge Instructions for Patients Receiving Chemotherapy  Today you received the following chemotherapy agents Paclitaxel   To help prevent nausea and vomiting after your treatment, we encourage you to take your nausea medication as directed  If you develop nausea and vomiting that is not controlled by your nausea medication, call the clinic.   BELOW ARE SYMPTOMS THAT SHOULD BE REPORTED IMMEDIATELY:  *FEVER GREATER THAN 100.5 F  *CHILLS WITH OR WITHOUT FEVER  NAUSEA AND VOMITING THAT IS NOT CONTROLLED WITH YOUR NAUSEA MEDICATION  *UNUSUAL SHORTNESS OF BREATH  *UNUSUAL BRUISING OR BLEEDING  TENDERNESS IN MOUTH AND THROAT WITH OR WITHOUT PRESENCE OF ULCERS  *URINARY PROBLEMS  *BOWEL PROBLEMS  UNUSUAL RASH Items with * indicate a potential emergency and should be followed up as soon as possible.  Feel free to call the clinic should you have any questions or concerns. The clinic phone number is [phone].  Please show the [place] at check-in to the Emergency Department and triage nurse.

## 2017-03-16 NOTE — Patient Outreach (Signed)
Request received from [name], RN to extend Mobile meals for 30 more days.  Contact at [employer] of Kathleen Argue was notified today.

## 2017-03-18 DIAGNOSIS — R0602 Shortness of breath: Secondary | ICD-10-CM | POA: Diagnosis not present

## 2017-03-22 ENCOUNTER — Ambulatory Visit: Payer: Medicare HMO

## 2017-03-22 ENCOUNTER — Other Ambulatory Visit (HOSPITAL_BASED_OUTPATIENT_CLINIC_OR_DEPARTMENT_OTHER): Payer: Medicare HMO

## 2017-03-22 ENCOUNTER — Ambulatory Visit (HOSPITAL_BASED_OUTPATIENT_CLINIC_OR_DEPARTMENT_OTHER): Payer: Medicare HMO

## 2017-03-22 VITALS — BP 113/69 | HR 112 | Temp 98.1°F | Resp 17

## 2017-03-22 DIAGNOSIS — C3412 Malignant neoplasm of upper lobe, left bronchus or lung: Secondary | ICD-10-CM

## 2017-03-22 DIAGNOSIS — C7949 Secondary malignant neoplasm of other parts of nervous system: Secondary | ICD-10-CM

## 2017-03-22 DIAGNOSIS — Z5111 Encounter for antineoplastic chemotherapy: Secondary | ICD-10-CM | POA: Diagnosis not present

## 2017-03-22 DIAGNOSIS — C7931 Secondary malignant neoplasm of brain: Secondary | ICD-10-CM

## 2017-03-22 DIAGNOSIS — Z7189 Other specified counseling: Secondary | ICD-10-CM

## 2017-03-22 LAB — CBC WITH DIFFERENTIAL/PLATELET
BASO%: 0.6 % (ref 0.0–2.0)
Basophils Absolute: 0 10*3/uL (ref 0.0–0.1)
EOS ABS: 0 10*3/uL (ref 0.0–0.5)
EOS%: 1 % (ref 0.0–7.0)
HCT: 34.2 % — ABNORMAL LOW (ref 34.8–46.6)
HGB: 10.5 g/dL — ABNORMAL LOW (ref 11.6–15.9)
LYMPH%: 20.1 % (ref 14.0–49.7)
MCH: 24.4 pg — ABNORMAL LOW (ref 25.1–34.0)
MCHC: 30.7 g/dL — AB (ref 31.5–36.0)
MCV: 79.5 fL (ref 79.5–101.0)
MONO#: 0.3 10*3/uL (ref 0.1–0.9)
MONO%: 8.9 % (ref 0.0–14.0)
NEUT%: 69.4 % (ref 38.4–76.8)
NEUTROS ABS: 2.2 10*3/uL (ref 1.5–6.5)
PLATELETS: 163 10*3/uL (ref 145–400)
RBC: 4.3 10*6/uL (ref 3.70–5.45)
RDW: 20.5 % — ABNORMAL HIGH (ref 11.2–14.5)
WBC: 3.1 10*3/uL — AB (ref 3.9–10.3)
lymph#: 0.6 10*3/uL — ABNORMAL LOW (ref 0.9–3.3)
nRBC: 0 % (ref 0–0)

## 2017-03-22 LAB — COMPREHENSIVE METABOLIC PANEL
ALBUMIN: 2.9 g/dL — AB (ref 3.5–5.0)
ALK PHOS: 105 U/L (ref 40–150)
AST: 8 U/L (ref 5–34)
Anion Gap: 6 mEq/L (ref 3–11)
BILIRUBIN TOTAL: 0.41 mg/dL (ref 0.20–1.20)
BUN: 10.7 mg/dL (ref 7.0–26.0)
CO2: 28 mEq/L (ref 22–29)
CREATININE: 0.9 mg/dL (ref 0.6–1.1)
Calcium: 8.9 mg/dL (ref 8.4–10.4)
Chloride: 106 mEq/L (ref 98–109)
GLUCOSE: 100 mg/dL (ref 70–140)
Potassium: 3.3 mEq/L — ABNORMAL LOW (ref 3.5–5.1)
Sodium: 140 mEq/L (ref 136–145)
TOTAL PROTEIN: 6.3 g/dL — AB (ref 6.4–8.3)

## 2017-03-22 MED ORDER — HEPARIN SOD (PORK) LOCK FLUSH 100 UNIT/ML IV SOLN
500.0000 [IU] | Freq: Once | INTRAVENOUS | Status: AC | PRN
  Administered 2017-03-22: 500 [IU]
  Filled 2017-03-22: qty 5

## 2017-03-22 MED ORDER — FAMOTIDINE IN NACL 20-0.9 MG/50ML-% IV SOLN
20.0000 mg | Freq: Once | INTRAVENOUS | Status: AC
  Administered 2017-03-22: 20 mg via INTRAVENOUS

## 2017-03-22 MED ORDER — DEXAMETHASONE SODIUM PHOSPHATE 10 MG/ML IJ SOLN
INTRAMUSCULAR | Status: AC
  Filled 2017-03-22: qty 1

## 2017-03-22 MED ORDER — SODIUM CHLORIDE 0.9 % IV SOLN
64.0000 mg/m2 | Freq: Once | INTRAVENOUS | Status: AC
  Administered 2017-03-22: 114 mg via INTRAVENOUS
  Filled 2017-03-22: qty 19

## 2017-03-22 MED ORDER — PALONOSETRON HCL INJECTION 0.25 MG/5ML
INTRAVENOUS | Status: AC
  Filled 2017-03-22: qty 5

## 2017-03-22 MED ORDER — SODIUM CHLORIDE 0.9% FLUSH
10.0000 mL | INTRAVENOUS | Status: DC | PRN
  Administered 2017-03-22: 10 mL
  Filled 2017-03-22: qty 10

## 2017-03-22 MED ORDER — SODIUM CHLORIDE 0.9 % IV SOLN
Freq: Once | INTRAVENOUS | Status: AC
  Administered 2017-03-22: 13:00:00 via INTRAVENOUS

## 2017-03-22 MED ORDER — FAMOTIDINE IN NACL 20-0.9 MG/50ML-% IV SOLN
INTRAVENOUS | Status: AC
  Filled 2017-03-22: qty 50

## 2017-03-22 MED ORDER — PALONOSETRON HCL INJECTION 0.25 MG/5ML
0.2500 mg | Freq: Once | INTRAVENOUS | Status: AC
  Administered 2017-03-22: 0.25 mg via INTRAVENOUS

## 2017-03-22 MED ORDER — SODIUM CHLORIDE 0.9 % IJ SOLN
10.0000 mL | INTRAMUSCULAR | Status: DC | PRN
  Administered 2017-03-22: 10 mL via INTRAVENOUS
  Filled 2017-03-22: qty 10

## 2017-03-22 MED ORDER — DIPHENHYDRAMINE HCL 50 MG/ML IJ SOLN
50.0000 mg | Freq: Once | INTRAMUSCULAR | Status: AC
  Administered 2017-03-22: 50 mg via INTRAVENOUS

## 2017-03-22 MED ORDER — DIPHENHYDRAMINE HCL 50 MG/ML IJ SOLN
INTRAMUSCULAR | Status: AC
  Filled 2017-03-22: qty 1

## 2017-03-22 MED ORDER — DEXAMETHASONE SODIUM PHOSPHATE 10 MG/ML IJ SOLN
10.0000 mg | Freq: Once | INTRAMUSCULAR | Status: AC
  Administered 2017-03-22: 10 mg via INTRAVENOUS

## 2017-03-22 NOTE — Patient Instructions (Signed)
[place] Discharge Instructions for Patients Receiving Chemotherapy  Today you received the following chemotherapy agents: Paclitaxel (Taxol)  To help prevent nausea and vomiting after your treatment, we encourage you to take your nausea medication as prescribed. If you develop nausea and vomiting that is not controlled by your nausea medication, call the clinic.   BELOW ARE SYMPTOMS THAT SHOULD BE REPORTED IMMEDIATELY:  *FEVER GREATER THAN 100.5 F  *CHILLS WITH OR WITHOUT FEVER  NAUSEA AND VOMITING THAT IS NOT CONTROLLED WITH YOUR NAUSEA MEDICATION  *UNUSUAL SHORTNESS OF BREATH  *UNUSUAL BRUISING OR BLEEDING  TENDERNESS IN MOUTH AND THROAT WITH OR WITHOUT PRESENCE OF ULCERS  *URINARY PROBLEMS  *BOWEL PROBLEMS  UNUSUAL RASH Items with * indicate a potential emergency and should be followed up as soon as possible.  Feel free to call the clinic should you have any questions or concerns. The clinic phone number is [phone].  Please show the [place] at check-in to the Emergency Department and triage nurse.

## 2017-03-23 ENCOUNTER — Other Ambulatory Visit: Payer: Self-pay | Admitting: *Deleted

## 2017-03-23 NOTE — Patient Outreach (Signed)
[facility]) Care Management  03/23/2017  Lauren [name] 1950/05/20 [id number]   CSW called & spoke with patient to discuss treatments and resources. Patient reports that since her treatment dose was cut down, her body has been reacting well - no diarrhea. Patient states that she is grateful for the ensure, rolling walker & pack of adult briefs that [facility] provided her. Patient states that she is glad that her Meals on Wheels has been extended that she looks forward to getting a hot meal every day. Patient had a friend visit during [place] call ending call. CSW will check back in 1 month.    [name], LCSW [facility]  Clinical Social Worker cell #: [phone]

## 2017-03-29 ENCOUNTER — Ambulatory Visit (HOSPITAL_BASED_OUTPATIENT_CLINIC_OR_DEPARTMENT_OTHER): Payer: Medicare HMO

## 2017-03-29 ENCOUNTER — Telehealth: Payer: Self-pay | Admitting: Hematology and Oncology

## 2017-03-29 ENCOUNTER — Ambulatory Visit (HOSPITAL_BASED_OUTPATIENT_CLINIC_OR_DEPARTMENT_OTHER): Payer: Medicare HMO | Admitting: Hematology and Oncology

## 2017-03-29 ENCOUNTER — Other Ambulatory Visit: Payer: Self-pay | Admitting: Hematology and Oncology

## 2017-03-29 ENCOUNTER — Other Ambulatory Visit (HOSPITAL_BASED_OUTPATIENT_CLINIC_OR_DEPARTMENT_OTHER): Payer: Medicare HMO

## 2017-03-29 ENCOUNTER — Ambulatory Visit: Payer: Medicare HMO

## 2017-03-29 DIAGNOSIS — T451X5A Adverse effect of antineoplastic and immunosuppressive drugs, initial encounter: Secondary | ICD-10-CM

## 2017-03-29 DIAGNOSIS — C7931 Secondary malignant neoplasm of brain: Secondary | ICD-10-CM

## 2017-03-29 DIAGNOSIS — Z7189 Other specified counseling: Secondary | ICD-10-CM

## 2017-03-29 DIAGNOSIS — C7949 Secondary malignant neoplasm of other parts of nervous system: Secondary | ICD-10-CM

## 2017-03-29 DIAGNOSIS — C3412 Malignant neoplasm of upper lobe, left bronchus or lung: Secondary | ICD-10-CM

## 2017-03-29 DIAGNOSIS — D61818 Other pancytopenia: Secondary | ICD-10-CM

## 2017-03-29 DIAGNOSIS — Z5111 Encounter for antineoplastic chemotherapy: Secondary | ICD-10-CM | POA: Diagnosis not present

## 2017-03-29 DIAGNOSIS — G620 Drug-induced polyneuropathy: Secondary | ICD-10-CM | POA: Diagnosis not present

## 2017-03-29 LAB — CBC WITH DIFFERENTIAL/PLATELET
BASO%: 0.5 % (ref 0.0–2.0)
Basophils Absolute: 0 10*3/uL (ref 0.0–0.1)
EOS%: 1 % (ref 0.0–7.0)
Eosinophils Absolute: 0 10*3/uL (ref 0.0–0.5)
HEMATOCRIT: 33.8 % — AB (ref 34.8–46.6)
HEMOGLOBIN: 10.4 g/dL — AB (ref 11.6–15.9)
LYMPH#: 0.6 10*3/uL — AB (ref 0.9–3.3)
LYMPH%: 32.6 % (ref 14.0–49.7)
MCH: 24.6 pg — ABNORMAL LOW (ref 25.1–34.0)
MCHC: 30.8 g/dL — ABNORMAL LOW (ref 31.5–36.0)
MCV: 80.1 fL (ref 79.5–101.0)
MONO#: 0.2 10*3/uL (ref 0.1–0.9)
MONO%: 8.8 % (ref 0.0–14.0)
NEUT%: 57.1 % (ref 38.4–76.8)
NEUTROS ABS: 1.1 10*3/uL — AB (ref 1.5–6.5)
Platelets: 137 10*3/uL — ABNORMAL LOW (ref 145–400)
RBC: 4.22 10*6/uL (ref 3.70–5.45)
RDW: 21.4 % — ABNORMAL HIGH (ref 11.2–14.5)
WBC: 1.9 10*3/uL — AB (ref 3.9–10.3)
nRBC: 0 % (ref 0–0)

## 2017-03-29 LAB — COMPREHENSIVE METABOLIC PANEL
ALBUMIN: 2.9 g/dL — AB (ref 3.5–5.0)
ALK PHOS: 105 U/L (ref 40–150)
ALT: 7 U/L (ref 0–55)
AST: 9 U/L (ref 5–34)
Anion Gap: 6 mEq/L (ref 3–11)
BUN: 8.1 mg/dL (ref 7.0–26.0)
CALCIUM: 8.8 mg/dL (ref 8.4–10.4)
CHLORIDE: 110 meq/L — AB (ref 98–109)
CO2: 25 mEq/L (ref 22–29)
Creatinine: 0.9 mg/dL (ref 0.6–1.1)
GLUCOSE: 87 mg/dL (ref 70–140)
POTASSIUM: 3.7 meq/L (ref 3.5–5.1)
SODIUM: 141 meq/L (ref 136–145)
Total Bilirubin: 0.46 mg/dL (ref 0.20–1.20)
Total Protein: 6.2 g/dL — ABNORMAL LOW (ref 6.4–8.3)

## 2017-03-29 MED ORDER — HEPARIN SOD (PORK) LOCK FLUSH 100 UNIT/ML IV SOLN
500.0000 [IU] | Freq: Once | INTRAVENOUS | Status: AC | PRN
  Administered 2017-03-29: 500 [IU]
  Filled 2017-03-29: qty 5

## 2017-03-29 MED ORDER — FAMOTIDINE IN NACL 20-0.9 MG/50ML-% IV SOLN
INTRAVENOUS | Status: AC
  Filled 2017-03-29: qty 50

## 2017-03-29 MED ORDER — DIPHENHYDRAMINE HCL 50 MG/ML IJ SOLN
INTRAMUSCULAR | Status: AC
  Filled 2017-03-29: qty 1

## 2017-03-29 MED ORDER — SODIUM CHLORIDE 0.9 % IV SOLN
64.0000 mg/m2 | Freq: Once | INTRAVENOUS | Status: AC
  Administered 2017-03-29: 114 mg via INTRAVENOUS
  Filled 2017-03-29: qty 19

## 2017-03-29 MED ORDER — PALONOSETRON HCL INJECTION 0.25 MG/5ML
INTRAVENOUS | Status: AC
  Filled 2017-03-29: qty 5

## 2017-03-29 MED ORDER — DEXAMETHASONE SODIUM PHOSPHATE 10 MG/ML IJ SOLN
INTRAMUSCULAR | Status: AC
  Filled 2017-03-29: qty 1

## 2017-03-29 MED ORDER — SODIUM CHLORIDE 0.9% FLUSH
10.0000 mL | INTRAVENOUS | Status: DC | PRN
  Administered 2017-03-29: 10 mL
  Filled 2017-03-29: qty 10

## 2017-03-29 MED ORDER — SODIUM CHLORIDE 0.9 % IJ SOLN
10.0000 mL | INTRAMUSCULAR | Status: DC | PRN
  Administered 2017-03-29: 10 mL via INTRAVENOUS
  Filled 2017-03-29: qty 10

## 2017-03-29 MED ORDER — DIPHENHYDRAMINE HCL 50 MG/ML IJ SOLN
50.0000 mg | Freq: Once | INTRAMUSCULAR | Status: AC
  Administered 2017-03-29: 50 mg via INTRAVENOUS

## 2017-03-29 MED ORDER — FAMOTIDINE IN NACL 20-0.9 MG/50ML-% IV SOLN
20.0000 mg | Freq: Once | INTRAVENOUS | Status: AC
  Administered 2017-03-29: 20 mg via INTRAVENOUS

## 2017-03-29 MED ORDER — DEXAMETHASONE SODIUM PHOSPHATE 10 MG/ML IJ SOLN
10.0000 mg | Freq: Once | INTRAMUSCULAR | Status: AC
  Administered 2017-03-29: 10 mg via INTRAVENOUS

## 2017-03-29 MED ORDER — PALONOSETRON HCL INJECTION 0.25 MG/5ML
0.2500 mg | Freq: Once | INTRAVENOUS | Status: AC
  Administered 2017-03-29: 0.25 mg via INTRAVENOUS

## 2017-03-29 MED ORDER — SODIUM CHLORIDE 0.9 % IV SOLN
Freq: Once | INTRAVENOUS | Status: AC
  Administered 2017-03-29: 13:00:00 via INTRAVENOUS

## 2017-03-29 NOTE — Telephone Encounter (Signed)
Scheduled appt per 11/12 los - Gave patient AVS and calender per los.  

## 2017-03-30 ENCOUNTER — Encounter: Payer: Self-pay | Admitting: Hematology and Oncology

## 2017-03-30 NOTE — Progress Notes (Signed)
[place] OFFICE PROGRESS NOTE  Patient Care Team: McDiarmid, Blane Ohara, MD as PCP - General (Family Medicine) Gaye Pollack, MD (Cardiothoracic Surgery) Dickie La, MD (Family Medicine) Heath Lark, MD as Consulting Physician (Hematology and Oncology) Pleasant, Eppie Gibson, RN as [place], [place], [place] (Optometry) Milus Banister, MD as Consulting Physician (Gastroenterology) Melida Quitter, MD as Consulting Physician (Otolaryngology) Standley Brooking, LCSW as [place] [facility] (Licensed Clinical Social Worker)  SUMMARY OF ONCOLOGIC HISTORY: Oncology History   Colon cancer   Primary site: Colon and Rectum (Left)   Staging method: AJCC 7th Edition   Clinical: Stage I (T2, N0, M0) signed by Heath Lark, MD on 05/31/2013  2:39 PM   Pathologic: Stage I (T2, N0, cM0) signed by Heath Lark, MD on 05/31/2013  2:39 PM   Summary: Stage I (T2, N0, cM0) Lung cancer, EGFR/ALK negative, recurrence after initial resection to LN and brain   Primary site: Lung (Left)   Staging method: AJCC 7th Edition   Clinical: Stage IV (T1, N2, M1b) signed by Heath Lark, MD on 05/31/2013  2:26 PM   Pathologic: Stage IV (T1, N2, M1b) signed by Heath Lark, MD on 05/31/2013  2:26 PM   Summary: Stage IV (T1, N2, M1b)       Cancer of upper lobe of left lung, Adenocarcinoma   03/01/2007 Procedure    Colonoscopy revealed abnormalities and biopsy show high-grade dysplasia      04/01/2007 Surgery    She underwent sigmoid resection which showed T2 N0 colon cancer, and negative margins and all of 17 lymph nodes were negative      02/29/2008 Procedure    Repeat surveillance colonoscopy was negative.      06/16/2010 Surgery    She underwent left upper lobectomy we show well-differentiated adenocarcinoma of the lung, T1, N0, M0      03/18/2011 Procedure    Repeat colonoscopy show multiple polyps but there were benign      10/19/2011  Procedure    Biopsy of mediastinal lymph node came back positive for recurrence of lung cancer, EGFR and ALK negative      11/16/2011 - 12/14/2011 Chemotherapy    She received concurrent chemoradiation therapy with weekly carboplatin and Taxol.      11/16/2011 - 12/29/2011 Radiation Therapy    She received radiation therapy with weekly chemotherapy      02/15/2012 Imaging    MR of the brain showed a new intracranial metastases. This was subsequently treated with stereotactic radiosurgery.      03/07/2012 - 04/12/2013 Chemotherapy    She received chemotherapy with maintainence Alimta every 3 weeks. Chemotherapy was discontinued due to profound fatigue      03/02/2013 Procedure    She had therapeutic ultrasound guidance thoracentesis for pleural effusion that came back negative for cancer      05/04/2013 Procedure    She had repeat ultrasound-guided thoracentesis again and cytology was negative      05/29/2013 Imaging    Repeat CT scan of the chest, abdomen and pelvis show no evidence of disease but persistent right-sided pleural effusion      06/02/2013 Surgery    The patient had placement of Pleurx catheter and subsequently underwent pleurodesis.      09/22/2013 Imaging    Repeat CT scan show no evidence of active disease. There are nonspecific lymphadenopathy and she is placed on observation.      03/23/2014 Imaging  Repeat CT scan of the chest, abdomen and pelvis show recurrence of cancer with widespread bilateral pulmonary metastasis.      05/14/2014 Imaging    Imaging of the chest and brain were repeated due to delay of initiation of chemotherapy. Overall, chest CT scan show stable disease. MRI of the head was negative for recurrence      05/16/2014 - 07/18/2014 Chemotherapy     she completed 4 cycles of combination chemotherapy with carboplatin and Alimta      07/16/2014 Imaging     repeat CT scan of the chest, abdomen and pelvis show regression in the size of lung  nodules.      08/29/2014 - 08/14/2015 Chemotherapy    She received maintenance Alimta      10/16/2014 Imaging     repeat CT scan of the chest, abdomen and pelvis show regression in the size of lung nodules.      01/29/2015 Imaging    Repeat CT scan showed stable disease      02/01/2015 Imaging    MRI brain is negative      04/30/2015 Imaging    CT scan of the chest, abdomen and pelvis showed stable disease      07/25/2015 Imaging    MRI brain showed no evidence of new disease      09/09/2015 Imaging    CT chest showed interval increase in size of large RIGHT upper lobe nodule is most consistent with lung cancer recurrence.      09/18/2015 - 03/26/2016 Chemotherapy    She started on Nivolumab      10/22/2015 Imaging    Screening mammogram showed mild abnormality      10/31/2015 Imaging    Diagnostic mammogram showed mild calcification at the right upper outer breast      11/27/2015 Imaging    Stable post treatment related changes of left lower lobectomy and radiation therapy redemonstrated, as above. No definite findings to suggest local recurrence of disease or metastatic disease on today's examination      01/24/2016 Imaging    MR brain showed continued stable appearance of two small treated brain metastases. No new or progressive metastatic disease to the brain.      04/15/2016 Imaging    Ct scan showed findings highly suspicious for recurrent tumor in the mediastinum surrounding the right mainstem bronchus and in the region of the azygoesophageal recess. Diffuse pulmonary metastatic disease with new and progressive nodules since the prior examination.      04/24/2016 - 12/18/2016 Chemotherapy    The patient had chemotherapy with gemzar and Carboplatin      04/30/2016 Imaging    Decreased contrast enhancement of the two treated metastases in the right frontal and left occipital lobes. No new lesions.      06/01/2016 Imaging    CXR showed numerous metastatic nodules have  progressed compared to the most recent comparison chest x-ray of 07/15/2015. Majority of these were noted on the 04/15/2016 chest CT. No segmental infiltrate or pulmonary edema.      06/19/2016 Adverse Reaction    Treatment is placed on hold today due to neutropenia and the patient not feeling well      06/29/2016 Imaging    Interval decrease in size of metastatic pulmonary nodules. 2. Persistent but slightly improved right hilar/mediastinal disease. No progressive findings. 3. No findings for upper abdominal metastatic disease      10/01/2016 Imaging    Mild increase in size of 2.5 cm central right  lower lobe pulmonary nodule, consistent with metastatic disease. Numerous other bilateral sub-cm pulmonary nodules are stable.  Stable mild subcarinal and right cardiophrenic angle lymphadenopathy. Stable emphysema and right lung radiation changes. Stable hepatic steatosis. Aortic and coronary artery atherosclerosis.      11/06/2016 Imaging    MR brain: Stable treated RIGHT insula and LEFT occipital metastases.      12/30/2016 Imaging    1. Bilateral pulmonary nodules are minimally progressive from 10/01/2016. Dominant nodule in the superior segment right lower lobe is stable. 2. Aortic atherosclerosis (ICD10-170.0). Coronary artery calcification.      01/08/2017 - 01/08/2017 Chemotherapy    She received 1 dose of Abraxane, discontinued because of insurance coverage      02/23/2017 Imaging    Mild decrease in size of dominant pulmonary nodule in the central right lower lobe. Otherwise stable sub-cm bilateral pulmonary nodules and mild mediastinal lymphadenopathy. No new or progressive disease identified.  Stable right lung post radiation changes.  Aortic Atherosclerosis (ICD10-I70.0) and Emphysema (ICD10-J43.9).       Brain metastases treated with radiosurgery   02/26/2012 Initial Diagnosis    Brain metastases treated with radiosurgery       INTERVAL HISTORY: Please see below for  problem oriented charting. She returns for further follow-up She felt that neuropathy is stable with recent dose adjustment and gabapentin She denies recent cough, chest pain or shortness of breath No recent infection  REVIEW OF SYSTEMS:   Constitutional: Denies fevers, chills or abnormal weight loss Eyes: Denies blurriness of vision Ears, nose, mouth, throat, and face: Denies mucositis or sore throat Respiratory: Denies cough, dyspnea or wheezes Cardiovascular: Denies palpitation, chest discomfort or lower extremity swelling Gastrointestinal:  Denies nausea, heartburn or change in bowel habits Skin: Denies abnormal skin rashes Lymphatics: Denies new lymphadenopathy or easy bruising Neurological:Denies numbness, tingling or new weaknesses Behavioral/Psych: Mood is stable, no new changes  All other systems were reviewed with the patient and are negative.  I have reviewed the past medical history, past surgical history, social history and family history with the patient and they are unchanged from previous note.  ALLERGIES:  is allergic to adhesive [tape] and lisinopril.  MEDICATIONS:  Current Outpatient Medications  Medication Sig Dispense Refill  . aspirin EC 81 MG tablet Take 81 mg by mouth daily.    Marland Kitchen BYSTOLIC 10 MG tablet Take 1 tablet (10 mg total) by mouth daily. (Patient not taking: Reported on 03/11/2017) 90 tablet 1  . diclofenac sodium (VOLTAREN) 1 % GEL Apply 2 g topically 4 (four) times daily as needed. (Patient taking differently: Apply 2 g topically 4 (four) times daily as needed (pain). ) 100 g 1  . diphenoxylate-atropine (LOMOTIL) 2.5-0.025 MG tablet Take 1 tablet by mouth 4 (four) times daily as needed for diarrhea or loose stools. 30 tablet 0  . HYDROmorphone (DILAUDID) 8 MG tablet Take 1 tablet (8 mg total) by mouth every 6 (six) hours as needed for severe pain. 60 tablet 0  . ipratropium (ATROVENT HFA) 17 MCG/ACT inhaler Inhale 2 puffs into the lungs every 4 (four)  hours as needed for wheezing (Cough). 3 Inhaler 3  . loratadine (CLARITIN) 10 MG tablet Take 10 mg by mouth daily.    Marland Kitchen LORazepam (ATIVAN) 0.5 MG tablet Take 1 tablet (0.5 mg total) by mouth every 8 (eight) hours as needed for anxiety. 60 tablet 1  . mirtazapine (REMERON) 15 MG tablet Take 1 tablet (15 mg total) by mouth at bedtime. 30 tablet 11  .  nitroGLYCERIN (NITROSTAT) 0.4 MG SL tablet Place 1 tablet (0.4 mg total) under the tongue every 5 (five) minutes as needed. For chest pain (Patient not taking: Reported on 01/27/2017) 25 tablet 5  . omeprazole (PRILOSEC) 40 MG capsule Take 1 capsule (40 mg total) by mouth daily. (Patient not taking: Reported on 03/11/2017) 90 capsule 1  . ondansetron (ZOFRAN ODT) 4 MG disintegrating tablet Take 1 tablet (4 mg total) by mouth every 8 (eight) hours as needed for nausea or vomiting. (Patient not taking: Reported on 03/11/2017) 20 tablet 0  . pravastatin (PRAVACHOL) 40 MG tablet Take 1 tablet (40 mg total) by mouth daily. (Patient not taking: Reported on 03/11/2017) 90 tablet 3  . prochlorperazine (COMPAZINE) 10 MG tablet Take 1 tablet (10 mg total) by mouth every 6 (six) hours as needed for nausea or vomiting. (Patient not taking: Reported on 01/27/2017) 30 tablet 1  . traMADol (ULTRAM) 50 MG tablet Take 1 tablet (50 mg total) by mouth every 6 (six) hours as needed. 30 tablet 0  . umeclidinium-vilanterol (ANORO ELLIPTA) 62.5-25 MCG/INH AEPB Inhale 1 puff into the lungs daily. 3 each 3  . VENTOLIN HFA 108 (90 Base) MCG/ACT inhaler Inhale 2 puffs into the lungs every 4 (four) hours as needed for wheezing or shortness of breath. 3 Inhaler 3   No current facility-administered medications for this visit.    Facility-Administered Medications Ordered in Other Visits  Medication Dose Route Frequency Provider Last Rate Last Dose  . sodium chloride 0.9 % injection 10 mL  10 mL Intravenous PRN Alvy Bimler, Azaryah Heathcock, MD   10 mL at 09/18/15 1345  . sodium chloride 0.9 % injection  10 mL  10 mL Intravenous PRN Alvy Bimler, Taylinn Brabant, MD   10 mL at 04/24/16 1136  . sodium chloride 0.9 % injection 10 mL  10 mL Intravenous PRN Alvy Bimler, Keaton Stirewalt, MD   10 mL at 01/15/17 1322    PHYSICAL EXAMINATION: ECOG PERFORMANCE STATUS: 1 - Symptomatic but completely ambulatory  Vitals:   03/29/17 1100  BP: 137/81  Pulse: (!) 109  Resp: 18  Temp: 98.1 F (36.7 C)  SpO2: 100%   Filed Weights   03/29/17 1100  Weight: 153 lb 14.4 oz (69.8 kg)    GENERAL:alert, no distress and comfortable SKIN: skin color, texture, turgor are normal, no rashes or significant lesions EYES: normal, Conjunctiva are pink and non-injected, sclera clear OROPHARYNX:no exudate, no erythema and lips, buccal mucosa, and tongue normal  NECK: supple, thyroid normal size, non-tender, without nodularity LYMPH:  no palpable lymphadenopathy in the cervical, axillary or inguinal LUNGS: clear to auscultation and percussion with normal breathing effort HEART: regular rate & rhythm and no murmurs and no lower extremity edema ABDOMEN:abdomen soft, non-tender and normal bowel sounds Musculoskeletal:no cyanosis of digits and no clubbing  NEURO: alert & oriented x 3 with fluent speech, no focal motor/sensory deficits  LABORATORY DATA:  I have reviewed the data as listed    Component Value Date/Time   NA 141 03/29/2017 1003   K 3.7 03/29/2017 1003   CL 107 01/25/2017 1743   CL 102 10/24/2012 1001   CO2 25 03/29/2017 1003   GLUCOSE 87 03/29/2017 1003   GLUCOSE 111 (H) 10/24/2012 1001   BUN 8.1 03/29/2017 1003   CREATININE 0.9 03/29/2017 1003   CALCIUM 8.8 03/29/2017 1003   PROT 6.2 (L) 03/29/2017 1003   ALBUMIN 2.9 (L) 03/29/2017 1003   AST 9 03/29/2017 1003   ALT 7 03/29/2017 1003   ALKPHOS 105  03/29/2017 1003   BILITOT 0.46 03/29/2017 1003   GFRNONAA >60 01/25/2017 1743   GFRAA >60 01/25/2017 1743    No results found for: SPEP, UPEP  Lab Results  Component Value Date   WBC 1.9 (L) 03/29/2017   NEUTROABS 1.1 (L)  03/29/2017   HGB 10.4 (L) 03/29/2017   HCT 33.8 (L) 03/29/2017   MCV 80.1 03/29/2017   PLT 137 (L) 03/29/2017      Chemistry      Component Value Date/Time   NA 141 03/29/2017 1003   K 3.7 03/29/2017 1003   CL 107 01/25/2017 1743   CL 102 10/24/2012 1001   CO2 25 03/29/2017 1003   BUN 8.1 03/29/2017 1003   CREATININE 0.9 03/29/2017 1003      Component Value Date/Time   CALCIUM 8.8 03/29/2017 1003   ALKPHOS 105 03/29/2017 1003   AST 9 03/29/2017 1003   ALT 7 03/29/2017 1003   BILITOT 0.46 03/29/2017 1003     ASSESSMENT & PLAN:  Cancer of upper lobe of left lung, Adenocarcinoma She tolerated treatment poorly with diarrhea and poor appetite Repeat CT imaging showed stable disease with positive response to treatment We have a long discussion regarding dosing of treatment Ultimately, the patient wants to keep on weekly schedule, to be treated on days 1, 8, 15 and rest day 22 I plan to reduce the dose of treatment due to peripheral neuropathy  Pancytopenia, acquired ([place]) Pancytopenia is due to chemo She is not symptomatic. She received 1 dose of G-CSF support in August I recommend holding off further growth factor support for now  Neuropathy due to chemotherapeutic drug ([place]) She was started on gabapentin with minimum benefit I recommend increasing the dose of gabapentin to 600 mg twice a day and we will reassess next visit   No orders of the defined types were placed in this encounter.  All questions were answered. The patient knows to call the clinic with any problems, questions or concerns. No barriers to learning was detected. I spent 15 minutes counseling the patient face to face. The total time spent in the appointment was 20 minutes and more than 50% was on counseling and review of test results     Heath Lark, MD 03/30/2017 12:23 PM

## 2017-03-30 NOTE — Assessment & Plan Note (Signed)
She tolerated treatment poorly with diarrhea and poor appetite Repeat CT imaging showed stable disease with positive response to treatment We have a long discussion regarding dosing of treatment Ultimately, the patient wants to keep on weekly schedule, to be treated on days 1, 8, 15 and rest day 22 I plan to reduce the dose of treatment due to peripheral neuropathy

## 2017-03-30 NOTE — Assessment & Plan Note (Signed)
She was started on gabapentin with minimum benefit I recommend increasing the dose of gabapentin to 600 mg twice a day and we will reassess next visit

## 2017-03-30 NOTE — Assessment & Plan Note (Signed)
Pancytopenia is due to chemo She is not symptomatic. She received 1 dose of G-CSF support in August I recommend holding off further growth factor support for now

## 2017-04-05 ENCOUNTER — Telehealth: Payer: Self-pay

## 2017-04-05 MED ORDER — DIPHENOXYLATE-ATROPINE 2.5-0.025 MG PO TABS: 1.0000 | ORAL_TABLET | Freq: Four times a day (QID) | ORAL | 0 refills | Status: DC | PRN

## 2017-04-05 MED FILL — DIPHENOXYLATE/ATROPINE TAB: 2.5-0.025 | 7 days supply | Qty: 30 | Fill #0

## 2017-04-05 NOTE — Telephone Encounter (Signed)
Pt called for lomotil refill. It does help but chemo does cause diarrhea. Done.

## 2017-04-12 ENCOUNTER — Ambulatory Visit (HOSPITAL_BASED_OUTPATIENT_CLINIC_OR_DEPARTMENT_OTHER): Payer: Medicare HMO

## 2017-04-12 ENCOUNTER — Encounter (HOSPITAL_BASED_OUTPATIENT_CLINIC_OR_DEPARTMENT_OTHER): Payer: Medicare HMO

## 2017-04-12 ENCOUNTER — Ambulatory Visit: Payer: Medicare HMO

## 2017-04-12 DIAGNOSIS — Z5111 Encounter for antineoplastic chemotherapy: Secondary | ICD-10-CM | POA: Diagnosis not present

## 2017-04-12 DIAGNOSIS — C7949 Secondary malignant neoplasm of other parts of nervous system: Secondary | ICD-10-CM

## 2017-04-12 DIAGNOSIS — C3412 Malignant neoplasm of upper lobe, left bronchus or lung: Secondary | ICD-10-CM

## 2017-04-12 DIAGNOSIS — C7931 Secondary malignant neoplasm of brain: Secondary | ICD-10-CM | POA: Diagnosis not present

## 2017-04-12 DIAGNOSIS — Z7189 Other specified counseling: Secondary | ICD-10-CM

## 2017-04-12 LAB — CBC WITH DIFFERENTIAL/PLATELET
BASO%: 0.3 % (ref 0.0–2.0)
BASOS ABS: 0 10*3/uL (ref 0.0–0.1)
EOS ABS: 0 10*3/uL (ref 0.0–0.5)
EOS%: 1.1 % (ref 0.0–7.0)
HCT: 34.4 % — ABNORMAL LOW (ref 34.8–46.6)
HGB: 10.6 g/dL — ABNORMAL LOW (ref 11.6–15.9)
LYMPH%: 15.2 % (ref 14.0–49.7)
MCH: 24.4 pg — AB (ref 25.1–34.0)
MCHC: 30.8 g/dL — AB (ref 31.5–36.0)
MCV: 79.3 fL — AB (ref 79.5–101.0)
MONO#: 0.6 10*3/uL (ref 0.1–0.9)
MONO%: 16.6 % — AB (ref 0.0–14.0)
NEUT#: 2.5 10*3/uL (ref 1.5–6.5)
NEUT%: 66.8 % (ref 38.4–76.8)
Platelets: 195 10*3/uL (ref 145–400)
RBC: 4.34 10*6/uL (ref 3.70–5.45)
RDW: 21 % — AB (ref 11.2–14.5)
WBC: 3.7 10*3/uL — ABNORMAL LOW (ref 3.9–10.3)
lymph#: 0.6 10*3/uL — ABNORMAL LOW (ref 0.9–3.3)

## 2017-04-12 LAB — COMPREHENSIVE METABOLIC PANEL
ANION GAP: 10 meq/L (ref 3–11)
AST: 8 U/L (ref 5–34)
Albumin: 2.8 g/dL — ABNORMAL LOW (ref 3.5–5.0)
Alkaline Phosphatase: 100 U/L (ref 40–150)
BILIRUBIN TOTAL: 0.61 mg/dL (ref 0.20–1.20)
BUN: 8.3 mg/dL (ref 7.0–26.0)
CALCIUM: 9 mg/dL (ref 8.4–10.4)
CHLORIDE: 105 meq/L (ref 98–109)
CO2: 25 meq/L (ref 22–29)
CREATININE: 0.9 mg/dL (ref 0.6–1.1)
EGFR: >60 mL/min/{1.73_m2} (ref >60)
Glucose: 107 mg/dl (ref 70–140)
Potassium: 2.9 mEq/L — CL (ref 3.5–5.1)
Sodium: 140 mEq/L (ref 136–145)
TOTAL PROTEIN: 6.5 g/dL (ref 6.4–8.3)

## 2017-04-12 MED ORDER — SODIUM CHLORIDE 0.9 % IV SOLN
64.0000 mg/m2 | Freq: Once | INTRAVENOUS | Status: AC
  Administered 2017-04-12: 114 mg via INTRAVENOUS
  Filled 2017-04-12: qty 19

## 2017-04-12 MED ORDER — FAMOTIDINE IN NACL 20-0.9 MG/50ML-% IV SOLN
20.0000 mg | Freq: Once | INTRAVENOUS | Status: AC
  Administered 2017-04-12: 20 mg via INTRAVENOUS

## 2017-04-12 MED ORDER — DIPHENHYDRAMINE HCL 50 MG/ML IJ SOLN
50.0000 mg | Freq: Once | INTRAMUSCULAR | Status: AC
  Administered 2017-04-12: 50 mg via INTRAVENOUS

## 2017-04-12 MED ORDER — PALONOSETRON HCL INJECTION 0.25 MG/5ML
0.2500 mg | Freq: Once | INTRAVENOUS | Status: AC
  Administered 2017-04-12: 0.25 mg via INTRAVENOUS

## 2017-04-12 MED ORDER — DEXAMETHASONE SODIUM PHOSPHATE 10 MG/ML IJ SOLN
10.0000 mg | Freq: Once | INTRAMUSCULAR | Status: AC
  Administered 2017-04-12: 10 mg via INTRAVENOUS

## 2017-04-12 MED ORDER — PALONOSETRON HCL INJECTION 0.25 MG/5ML
INTRAVENOUS | Status: AC
  Filled 2017-04-12: qty 5

## 2017-04-12 MED ORDER — SODIUM CHLORIDE 0.9 % IJ SOLN
10.0000 mL | INTRAMUSCULAR | Status: DC | PRN
  Administered 2017-04-12: 10 mL via INTRAVENOUS
  Filled 2017-04-12: qty 10

## 2017-04-12 MED ORDER — HEPARIN SOD (PORK) LOCK FLUSH 100 UNIT/ML IV SOLN
500.0000 [IU] | Freq: Once | INTRAVENOUS | Status: AC | PRN
  Administered 2017-04-12: 500 [IU]
  Filled 2017-04-12: qty 5

## 2017-04-12 MED ORDER — SODIUM CHLORIDE 0.9% FLUSH
10.0000 mL | INTRAVENOUS | Status: DC | PRN
  Administered 2017-04-12: 10 mL
  Filled 2017-04-12: qty 10

## 2017-04-12 MED ORDER — DEXAMETHASONE SODIUM PHOSPHATE 10 MG/ML IJ SOLN
INTRAMUSCULAR | Status: AC
  Filled 2017-04-12: qty 1

## 2017-04-12 MED ORDER — SODIUM CHLORIDE 0.9 % IV SOLN
Freq: Once | INTRAVENOUS | Status: AC
  Administered 2017-04-12: 11:00:00 via INTRAVENOUS

## 2017-04-12 MED ORDER — DIPHENHYDRAMINE HCL 50 MG/ML IJ SOLN
INTRAMUSCULAR | Status: AC
  Filled 2017-04-12: qty 1

## 2017-04-12 MED ORDER — FAMOTIDINE IN NACL 20-0.9 MG/50ML-% IV SOLN
INTRAVENOUS | Status: AC
  Filled 2017-04-12: qty 50

## 2017-04-12 NOTE — Patient Instructions (Signed)
Implanted Port Home Guide An implanted port is a type of central line that is placed under the skin. Central lines are used to provide IV access when treatment or nutrition needs to be given through a person's veins. Implanted ports are used for long-term IV access. An implanted port may be placed because:  You need IV medicine that would be irritating to the small veins in your hands or arms.  You need long-term IV medicines, such as antibiotics.  You need IV nutrition for a long period.  You need frequent blood draws for lab tests.  You need dialysis.  Implanted ports are usually placed in the chest area, but they can also be placed in the upper arm, the abdomen, or the leg. An implanted port has two main parts:  Reservoir. The reservoir is round and will appear as a small, raised area under your skin. The reservoir is the part where a needle is inserted to give medicines or draw blood.  Catheter. The catheter is a thin, flexible tube that extends from the reservoir. The catheter is placed into a large vein. Medicine that is inserted into the reservoir goes into the catheter and then into the vein.  How will I care for my incision site? Do not get the incision site wet. Bathe or shower as directed by your health care provider. How is my port accessed? Special steps must be taken to access the port:  Before the port is accessed, a numbing cream can be placed on the skin. This helps numb the skin over the port site.  Your health care provider uses a sterile technique to access the port. ? Your health care provider must put on a mask and sterile gloves. ? The skin over your port is cleaned carefully with an antiseptic and allowed to dry. ? The port is gently pinched between sterile gloves, and a needle is inserted into the port.  Only "non-coring" port needles should be used to access the port. Once the port is accessed, a blood return should be checked. This helps ensure that the port  is in the vein and is not clogged.  If your port needs to remain accessed for a constant infusion, a clear (transparent) bandage will be placed over the needle site. The bandage and needle will need to be changed every week, or as directed by your health care provider.  Keep the bandage covering the needle clean and dry. Do not get it wet. Follow your health care provider's instructions on how to take a shower or bath while the port is accessed.  If your port does not need to stay accessed, no bandage is needed over the port.  What is flushing? Flushing helps keep the port from getting clogged. Follow your health care provider's instructions on how and when to flush the port. Ports are usually flushed with saline solution or a medicine called heparin. The need for flushing will depend on how the port is used.  If the port is used for intermittent medicines or blood draws, the port will need to be flushed: ? After medicines have been given. ? After blood has been drawn. ? As part of routine maintenance.  If a constant infusion is running, the port may not need to be flushed.  How long will my port stay implanted? The port can stay in for as long as your health care provider thinks it is needed. When it is time for the port to come out, surgery will be   done to remove it. The procedure is similar to the one performed when the port was put in. When should I seek immediate medical care? When you have an implanted port, you should seek immediate medical care if:  You notice a bad smell coming from the incision site.  You have swelling, redness, or drainage at the incision site.  You have more swelling or pain at the port site or the surrounding area.  You have a fever that is not controlled with medicine.  This information is not intended to replace advice given to you by your health care provider. Make sure you discuss any questions you have with your health care provider. Document  Released: 05/04/2005 Document Revised: 10/10/2015 Document Reviewed: 01/09/2013 Elsevier Interactive Patient Education  2017 Elsevier Inc.  

## 2017-04-12 NOTE — Patient Instructions (Signed)
Take potassium 20 mEq by mouth once daily for 5 days.  [facility] Discharge Instructions for Patients Receiving Chemotherapy  Today you received the following chemotherapy agents:  Taxol.  To help prevent nausea and vomiting after your treatment, we encourage you to take your nausea medication as directed.   If you develop nausea and vomiting that is not controlled by your nausea medication, call the clinic.   BELOW ARE SYMPTOMS THAT SHOULD BE REPORTED IMMEDIATELY:  *FEVER GREATER THAN 100.5 F  *CHILLS WITH OR WITHOUT FEVER  NAUSEA AND VOMITING THAT IS NOT CONTROLLED WITH YOUR NAUSEA MEDICATION  *UNUSUAL SHORTNESS OF BREATH  *UNUSUAL BRUISING OR BLEEDING  TENDERNESS IN MOUTH AND THROAT WITH OR WITHOUT PRESENCE OF ULCERS  *URINARY PROBLEMS  *BOWEL PROBLEMS  UNUSUAL RASH Items with * indicate a potential emergency and should be followed up as soon as possible.  Feel free to call the clinic should you have any questions or concerns. The clinic phone number is [phone].  Please show the [place] at check-in to the Emergency Department and triage nurse.

## 2017-04-12 NOTE — Progress Notes (Signed)
Dr. Alvy Bimler aware of K+ 2.9. Advised to have patient take potassium 20 mEq po x 5 days. Patient aware and verbalized understanding.

## 2017-04-13 ENCOUNTER — Other Ambulatory Visit: Payer: Self-pay | Admitting: *Deleted

## 2017-04-13 NOTE — Patient Outreach (Signed)
[name] Care Management  04/13/2017   Lauren [name] October 25, 1950 [id number]  RN Health Coach telephone call to patient.  [employer] compliance verified. Per patient she is not in pain today but is worse at night time. Patient stated the Tramadol doesn't help so she has to take the Dilaudid. Patient coughing has increase. Patient stated that it is not productive. Patient has not wanted consultation with Hospice. She has Palliative care coming.  Patient is receiving meals on wheel through [facility] and has received the ensure. Per patient that is her one hot meal a day.  Patient appetite is still very poor. Patient is receiving chemotherapy. The diarrhea is better this week. Patient expresses feelings about calling to discuss her end of life concerns. She keeps stated she doesn't want to bother anyone. RN Health Coach lets patient know that it is ok to call and that she has counselors available  at the cancer center and her physician is their also. Patient has agreed to follow up outreach calls and welcomes them  Current Medications:  Current Outpatient Medications  Medication Sig Dispense Refill  . aspirin EC 81 MG tablet Take 81 mg by mouth daily.    Marland Kitchen BYSTOLIC 10 MG tablet Take 1 tablet (10 mg total) by mouth daily. (Patient not taking: Reported on 03/11/2017) 90 tablet 1  . diclofenac sodium (VOLTAREN) 1 % GEL Apply 2 g topically 4 (four) times daily as needed. (Patient taking differently: Apply 2 g topically 4 (four) times daily as needed (pain). ) 100 g 1  . diphenoxylate-atropine (LOMOTIL) 2.5-0.025 MG tablet Take 1 tablet 4 (four) times daily as needed by mouth for diarrhea or loose stools. 30 tablet 0  . HYDROmorphone (DILAUDID) 8 MG tablet Take 1 tablet (8 mg total) by mouth every 6 (six) hours as needed for severe pain. 60 tablet 0  . ipratropium (ATROVENT HFA) 17 MCG/ACT inhaler Inhale 2 puffs into the lungs every 4 (four) hours as needed for wheezing (Cough). 3 Inhaler 3   . loratadine (CLARITIN) 10 MG tablet Take 10 mg by mouth daily.    Marland Kitchen LORazepam (ATIVAN) 0.5 MG tablet Take 1 tablet (0.5 mg total) by mouth every 8 (eight) hours as needed for anxiety. 60 tablet 1  . mirtazapine (REMERON) 15 MG tablet Take 1 tablet (15 mg total) by mouth at bedtime. 30 tablet 11  . nitroGLYCERIN (NITROSTAT) 0.4 MG SL tablet Place 1 tablet (0.4 mg total) under the tongue every 5 (five) minutes as needed. For chest pain (Patient not taking: Reported on 01/27/2017) 25 tablet 5  . omeprazole (PRILOSEC) 40 MG capsule Take 1 capsule (40 mg total) by mouth daily. (Patient not taking: Reported on 03/11/2017) 90 capsule 1  . ondansetron (ZOFRAN ODT) 4 MG disintegrating tablet Take 1 tablet (4 mg total) by mouth every 8 (eight) hours as needed for nausea or vomiting. (Patient not taking: Reported on 03/11/2017) 20 tablet 0  . pravastatin (PRAVACHOL) 40 MG tablet Take 1 tablet (40 mg total) by mouth daily. (Patient not taking: Reported on 03/11/2017) 90 tablet 3  . prochlorperazine (COMPAZINE) 10 MG tablet Take 1 tablet (10 mg total) by mouth every 6 (six) hours as needed for nausea or vomiting. (Patient not taking: Reported on 01/27/2017) 30 tablet 1  . traMADol (ULTRAM) 50 MG tablet Take 1 tablet (50 mg total) by mouth every 6 (six) hours as needed. 30 tablet 0  . umeclidinium-vilanterol (ANORO ELLIPTA) 62.5-25 MCG/INH AEPB Inhale 1 puff into the lungs  daily. 3 each 3  . VENTOLIN HFA 108 (90 Base) MCG/ACT inhaler Inhale 2 puffs into the lungs every 4 (four) hours as needed for wheezing or shortness of breath. 3 Inhaler 3   No current facility-administered medications for this visit.    Facility-Administered Medications Ordered in Other Visits  Medication Dose Route Frequency Provider Last Rate Last Dose  . sodium chloride 0.9 % injection 10 mL  10 mL Intravenous PRN Alvy Bimler, Ni, MD   10 mL at 09/18/15 1345  . sodium chloride 0.9 % injection 10 mL  10 mL Intravenous PRN Alvy Bimler, Ni, MD   10  mL at 04/24/16 1136  . sodium chloride 0.9 % injection 10 mL  10 mL Intravenous PRN Heath Lark, MD   10 mL at 01/15/17 1322    Functional Status:  In your present state of health, do you have any difficulty performing the following activities: 04/13/2017 03/11/2017  Hearing? N N  Vision? Y Y  Difficulty concentrating or making decisions? [name]  Walking or climbing stairs? Y Y  Dressing or bathing? N N  Doing errands, shopping? [name]  Preparing Food and eating ? N N  Using the Toilet? N N  In the past six months, have you accidently leaked urine? Y Y  Do you have problems with loss of bowel control? N N  Managing your Medications? N N  Managing your Finances? N N  Housekeeping or managing your Housekeeping? Y Y  Some recent data might be hidden    Fall/Depression Screening: Fall Risk  04/13/2017 03/11/2017 03/11/2017  Falls in the past year? No No No  Number falls in past yr: - 1 -  Comment - - -  Injury with Fall? - No -  Comment - - -  Risk Factor Category  - High Fall Risk -  Risk for fall due to : History of fall(s);Impaired balance/gait;Impaired mobility;Medication side effect Impaired balance/gait;History of fall(s);Impaired mobility -  Risk for fall due to: Comment - - -  Follow up - Falls evaluation completed;Education provided;Falls prevention discussed -   PHQ 2/9 Scores 04/13/2017 03/11/2017 03/11/2017 01/27/2017 01/01/2017 12/31/2016 12/10/2016  PHQ - 2 Score 0 0 0 1 1 1  0  PHQ- 9 Score - - - - - - -  Exception Documentation - - - - - - -   [facility] CM Care Plan Problem One     Most Recent Value  Care Plan Problem One  Knowledge deficit in self management of COPD  Role Documenting the Problem One  [place] for Problem One  Active  [facility] Long Term Goal   Patient will not have any readmissions for COPD within the next 90 days  [facility] Long Term Goal Start Date  04/13/17  Interventions for Problem One Long Term Goal  RN reminded patient to keep appointments with PCP  and oncology and pulmonary specialist. RN reminded patient to take medications as per order.  [facility] CM Short Term Goal #1   Patient will be able to communicate end of life concerns with physician, counselors and nurse within the next 30 days  [facility] CM Short Term Goal #1 Start Date  04/13/17  Interventions for Short Term Goal #1  RN discussed with patient about talking with counselors, physican and [place] about end of life decisions. RN sent patient books to read about end of life. RN will follow up discussion with patient   [facility] CM Short Term Goal #3  Patient  will verbalize taking medication as per ordered within the next 30 days  [facility] CM Short Term Goal #3 Start Date  04/13/17  Interventions for Short Tern Goal #3  RN discussed the importance of taking medications as per ordered. Rn went over list of medications with patient . RN will follow up will further discussion      Assessment:  Patient cough is increasing Patient has pain mostly at night Patient expresses fears and concerns about end of life Plan: [place] supports patient to express her fears and concerns RN made patient aware of counseling availability RN sent end of life booklet to patient RN will follow up outreach within the month of December  [name] Management [phone]

## 2017-04-19 ENCOUNTER — Ambulatory Visit: Payer: Medicare HMO

## 2017-04-19 ENCOUNTER — Other Ambulatory Visit: Payer: Self-pay | Admitting: Hematology and Oncology

## 2017-04-19 ENCOUNTER — Other Ambulatory Visit (HOSPITAL_BASED_OUTPATIENT_CLINIC_OR_DEPARTMENT_OTHER): Payer: Medicare HMO

## 2017-04-19 ENCOUNTER — Ambulatory Visit (HOSPITAL_BASED_OUTPATIENT_CLINIC_OR_DEPARTMENT_OTHER): Payer: Medicare HMO

## 2017-04-19 VITALS — BP 158/76 | HR 102 | Temp 97.9°F | Resp 18

## 2017-04-19 DIAGNOSIS — C3412 Malignant neoplasm of upper lobe, left bronchus or lung: Secondary | ICD-10-CM | POA: Diagnosis not present

## 2017-04-19 DIAGNOSIS — C7931 Secondary malignant neoplasm of brain: Secondary | ICD-10-CM

## 2017-04-19 DIAGNOSIS — C7949 Secondary malignant neoplasm of other parts of nervous system: Secondary | ICD-10-CM

## 2017-04-19 DIAGNOSIS — Z7189 Other specified counseling: Secondary | ICD-10-CM

## 2017-04-19 DIAGNOSIS — Z5111 Encounter for antineoplastic chemotherapy: Secondary | ICD-10-CM

## 2017-04-19 LAB — CBC WITH DIFFERENTIAL/PLATELET
BASO%: 0.3 % (ref 0.0–2.0)
BASOS ABS: 0 10*3/uL (ref 0.0–0.1)
EOS ABS: 0.1 10*3/uL (ref 0.0–0.5)
EOS%: 1.6 % (ref 0.0–7.0)
HEMATOCRIT: 35 % (ref 34.8–46.6)
HEMOGLOBIN: 10.7 g/dL — AB (ref 11.6–15.9)
LYMPH#: 0.8 10*3/uL — AB (ref 0.9–3.3)
LYMPH%: 25.4 % (ref 14.0–49.7)
MCH: 24.3 pg — AB (ref 25.1–34.0)
MCHC: 30.6 g/dL — ABNORMAL LOW (ref 31.5–36.0)
MCV: 79.5 fL (ref 79.5–101.0)
MONO#: 0.2 10*3/uL (ref 0.1–0.9)
MONO%: 7.2 % (ref 0.0–14.0)
NEUT%: 65.5 % (ref 38.4–76.8)
NEUTROS ABS: 2 10*3/uL (ref 1.5–6.5)
Platelets: 203 10*3/uL (ref 145–400)
RBC: 4.4 10*6/uL (ref 3.70–5.45)
RDW: 21.4 % — AB (ref 11.2–14.5)
WBC: 3.1 10*3/uL — AB (ref 3.9–10.3)

## 2017-04-19 LAB — COMPREHENSIVE METABOLIC PANEL
ALBUMIN: 2.9 g/dL — AB (ref 3.5–5.0)
ALK PHOS: 110 U/L (ref 40–150)
ALT: <6 U/L (ref 0–55)
AST: 9 U/L (ref 5–34)
Anion Gap: 8 mEq/L (ref 3–11)
BILIRUBIN TOTAL: 0.36 mg/dL (ref 0.20–1.20)
BUN: 10.7 mg/dL (ref 7.0–26.0)
CALCIUM: 8.9 mg/dL (ref 8.4–10.4)
CO2: 25 mEq/L (ref 22–29)
CREATININE: 1.1 mg/dL (ref 0.6–1.1)
Chloride: 108 mEq/L (ref 98–109)
EGFR: >60 mL/min/{1.73_m2} (ref >60)
GLUCOSE: 117 mg/dL (ref 70–140)
Potassium: 3.1 mEq/L — ABNORMAL LOW (ref 3.5–5.1)
SODIUM: 142 meq/L (ref 136–145)
TOTAL PROTEIN: 6.4 g/dL (ref 6.4–8.3)

## 2017-04-19 MED ORDER — DEXAMETHASONE SODIUM PHOSPHATE 10 MG/ML IJ SOLN
INTRAMUSCULAR | Status: AC
  Filled 2017-04-19: qty 1

## 2017-04-19 MED ORDER — HEPARIN SOD (PORK) LOCK FLUSH 100 UNIT/ML IV SOLN
500.0000 [IU] | Freq: Once | INTRAVENOUS | Status: AC | PRN
  Administered 2017-04-19: 500 [IU]
  Filled 2017-04-19: qty 5

## 2017-04-19 MED ORDER — FAMOTIDINE IN NACL 20-0.9 MG/50ML-% IV SOLN
20.0000 mg | Freq: Once | INTRAVENOUS | Status: AC
  Administered 2017-04-19: 20 mg via INTRAVENOUS

## 2017-04-19 MED ORDER — ONDANSETRON HCL 40 MG/20ML IJ SOLN
Freq: Once | INTRAMUSCULAR | Status: DC
  Filled 2017-04-19: qty 4

## 2017-04-19 MED ORDER — ONDANSETRON HCL 4 MG/2ML IJ SOLN
INTRAMUSCULAR | Status: AC
  Filled 2017-04-19: qty 4

## 2017-04-19 MED ORDER — DIPHENHYDRAMINE HCL 50 MG/ML IJ SOLN
50.0000 mg | Freq: Once | INTRAMUSCULAR | Status: AC
  Administered 2017-04-19: 50 mg via INTRAVENOUS

## 2017-04-19 MED ORDER — SODIUM CHLORIDE 0.9 % IV SOLN
Freq: Once | INTRAVENOUS | Status: AC
  Administered 2017-04-19: 14:00:00 via INTRAVENOUS

## 2017-04-19 MED ORDER — SODIUM CHLORIDE 0.9% FLUSH
10.0000 mL | INTRAVENOUS | Status: DC | PRN
  Administered 2017-04-19: 10 mL
  Filled 2017-04-19: qty 10

## 2017-04-19 MED ORDER — ONDANSETRON HCL 4 MG/2ML IJ SOLN
8.0000 mg | Freq: Once | INTRAMUSCULAR | Status: AC
  Administered 2017-04-19: 8 mg via INTRAVENOUS

## 2017-04-19 MED ORDER — SODIUM CHLORIDE 0.9 % IJ SOLN
10.0000 mL | INTRAMUSCULAR | Status: AC | PRN
  Administered 2017-04-19: 10 mL via INTRAVENOUS
  Filled 2017-04-19: qty 10

## 2017-04-19 MED ORDER — FAMOTIDINE IN NACL 20-0.9 MG/50ML-% IV SOLN
INTRAVENOUS | Status: AC
  Filled 2017-04-19: qty 50

## 2017-04-19 MED ORDER — DIPHENHYDRAMINE HCL 50 MG/ML IJ SOLN
INTRAMUSCULAR | Status: AC
  Filled 2017-04-19: qty 1

## 2017-04-19 MED ORDER — DEXAMETHASONE SODIUM PHOSPHATE 10 MG/ML IJ SOLN
10.0000 mg | Freq: Once | INTRAMUSCULAR | Status: AC
  Administered 2017-04-19: 10 mg via INTRAVENOUS

## 2017-04-19 MED ORDER — PACLITAXEL CHEMO INJECTION 300 MG/50ML
64.0000 mg/m2 | Freq: Once | INTRAVENOUS | Status: AC
  Administered 2017-04-19: 114 mg via INTRAVENOUS
  Filled 2017-04-19: qty 19

## 2017-04-19 NOTE — Patient Instructions (Signed)
Implanted Port Home Guide An implanted port is a type of central line that is placed under the skin. Central lines are used to provide IV access when treatment or nutrition needs to be given through a person's veins. Implanted ports are used for long-term IV access. An implanted port may be placed because:  You need IV medicine that would be irritating to the small veins in your hands or arms.  You need long-term IV medicines, such as antibiotics.  You need IV nutrition for a long period.  You need frequent blood draws for lab tests.  You need dialysis.  Implanted ports are usually placed in the chest area, but they can also be placed in the upper arm, the abdomen, or the leg. An implanted port has two main parts:  Reservoir. The reservoir is round and will appear as a small, raised area under your skin. The reservoir is the part where a needle is inserted to give medicines or draw blood.  Catheter. The catheter is a thin, flexible tube that extends from the reservoir. The catheter is placed into a large vein. Medicine that is inserted into the reservoir goes into the catheter and then into the vein.  How will I care for my incision site? Do not get the incision site wet. Bathe or shower as directed by your health care provider. How is my port accessed? Special steps must be taken to access the port:  Before the port is accessed, a numbing cream can be placed on the skin. This helps numb the skin over the port site.  Your health care provider uses a sterile technique to access the port. ? Your health care provider must put on a mask and sterile gloves. ? The skin over your port is cleaned carefully with an antiseptic and allowed to dry. ? The port is gently pinched between sterile gloves, and a needle is inserted into the port.  Only "non-coring" port needles should be used to access the port. Once the port is accessed, a blood return should be checked. This helps ensure that the port  is in the vein and is not clogged.  If your port needs to remain accessed for a constant infusion, a clear (transparent) bandage will be placed over the needle site. The bandage and needle will need to be changed every week, or as directed by your health care provider.  Keep the bandage covering the needle clean and dry. Do not get it wet. Follow your health care provider's instructions on how to take a shower or bath while the port is accessed.  If your port does not need to stay accessed, no bandage is needed over the port.  What is flushing? Flushing helps keep the port from getting clogged. Follow your health care provider's instructions on how and when to flush the port. Ports are usually flushed with saline solution or a medicine called heparin. The need for flushing will depend on how the port is used.  If the port is used for intermittent medicines or blood draws, the port will need to be flushed: ? After medicines have been given. ? After blood has been drawn. ? As part of routine maintenance.  If a constant infusion is running, the port may not need to be flushed.  How long will my port stay implanted? The port can stay in for as long as your health care provider thinks it is needed. When it is time for the port to come out, surgery will be   done to remove it. The procedure is similar to the one performed when the port was put in. When should I seek immediate medical care? When you have an implanted port, you should seek immediate medical care if:  You notice a bad smell coming from the incision site.  You have swelling, redness, or drainage at the incision site.  You have more swelling or pain at the port site or the surrounding area.  You have a fever that is not controlled with medicine.  This information is not intended to replace advice given to you by your health care provider. Make sure you discuss any questions you have with your health care provider. Document  Released: 05/04/2005 Document Revised: 10/10/2015 Document Reviewed: 01/09/2013 Elsevier Interactive Patient Education  2017 Elsevier Inc.  

## 2017-04-19 NOTE — Patient Instructions (Signed)
[facility] Discharge Instructions for Patients Receiving Chemotherapy  Today you received the following chemotherapy agents:  Taxol.  To help prevent nausea and vomiting after your treatment, we encourage you to take your nausea medication as directed.   If you develop nausea and vomiting that is not controlled by your nausea medication, call the clinic.   BELOW ARE SYMPTOMS THAT SHOULD BE REPORTED IMMEDIATELY:  *FEVER GREATER THAN 100.5 F  *CHILLS WITH OR WITHOUT FEVER  NAUSEA AND VOMITING THAT IS NOT CONTROLLED WITH YOUR NAUSEA MEDICATION  *UNUSUAL SHORTNESS OF BREATH  *UNUSUAL BRUISING OR BLEEDING  TENDERNESS IN MOUTH AND THROAT WITH OR WITHOUT PRESENCE OF ULCERS  *URINARY PROBLEMS  *BOWEL PROBLEMS  UNUSUAL RASH Items with * indicate a potential emergency and should be followed up as soon as possible.  Feel free to call the clinic should you have any questions or concerns. The clinic phone number is [phone].  Please show the CHEMO ALERT CARD at check-in to the Emergency Department and triage nurse.   

## 2017-04-21 DIAGNOSIS — R0602 Shortness of breath: Secondary | ICD-10-CM | POA: Diagnosis not present

## 2017-04-22 ENCOUNTER — Other Ambulatory Visit: Payer: Self-pay | Admitting: *Deleted

## 2017-04-22 NOTE — Patient Outreach (Signed)
[place] [facility]) Care Management  04/22/2017  Lauren [name] [birth year] [id number]   CSW attempted to reach patient but no answer (ph#: [phone]). CSW left HIPPA compliant voicemail & will await returned call. CSW will try again within a week if no response.    [name], LCSW [facility]  Clinical Social Worker cell #: [phone]

## 2017-04-23 ENCOUNTER — Telehealth: Payer: Self-pay

## 2017-04-23 ENCOUNTER — Other Ambulatory Visit: Payer: Self-pay | Admitting: Hematology and Oncology

## 2017-04-23 NOTE — Telephone Encounter (Signed)
Called with below message. She is okay with her next appt on 12/24, she wants to see Dr. Alvy Bimler on 12/24.

## 2017-04-23 NOTE — Telephone Encounter (Signed)
Patient called and canceled appointments for Monday due weather forecast. Would like to reschedule.

## 2017-04-23 NOTE — Telephone Encounter (Signed)
Based on current availability, the earliest reschedule will be on 12/21. I recommend we just skip Monday and continue her treatment on 12/24 as scheduled if that is OK with her

## 2017-04-23 NOTE — Telephone Encounter (Signed)
I will send scheduling msg to see her on 12/24

## 2017-04-24 ENCOUNTER — Telehealth: Payer: Self-pay | Admitting: Hematology and Oncology

## 2017-04-24 NOTE — Telephone Encounter (Signed)
Called patient regarding appointment

## 2017-04-26 ENCOUNTER — Ambulatory Visit: Payer: Self-pay

## 2017-04-26 ENCOUNTER — Other Ambulatory Visit: Payer: Self-pay

## 2017-04-26 ENCOUNTER — Ambulatory Visit: Payer: Self-pay | Admitting: Hematology and Oncology

## 2017-04-29 ENCOUNTER — Other Ambulatory Visit: Payer: Self-pay | Admitting: *Deleted

## 2017-05-03 NOTE — Patient Outreach (Signed)
[facility]) Care Management  05/03/2017  Lauren [name] May 08, 1951 [id number]   CSW called & spoke with patient regarding community resources. Patient reports that she is still receiving meals on wheels and no concerns there. Patient states that she is looking forward to seeing family on Christmas that she hadn't seen in some time and just trying to get through the holidays.   CSW will perform a case closure on patient, as all goals of treatment have been met from social work standpoint and no additional social work needs have been identified at this time. CSW will notify patient's RNCM with [facility], Joaquim Lai of CSW's plans to close patient's case.   CSW will fax an update to patient's Primary Care Physician, Dr. Sherren Mocha McDiarmid to ensure that they are aware of CSW's involvement with patient's plan of care.   CSW ensured patient that RNCM with [facility], Frances's continued involvement with patient's care.    Lauren Opitz, LCSW [facility]  Clinical Social Worker cell #: [phone]

## 2017-05-10 ENCOUNTER — Telehealth: Payer: Self-pay | Admitting: Hematology and Oncology

## 2017-05-10 ENCOUNTER — Other Ambulatory Visit: Payer: Self-pay

## 2017-05-10 ENCOUNTER — Other Ambulatory Visit (HOSPITAL_BASED_OUTPATIENT_CLINIC_OR_DEPARTMENT_OTHER): Payer: Medicare HMO

## 2017-05-10 ENCOUNTER — Ambulatory Visit (HOSPITAL_BASED_OUTPATIENT_CLINIC_OR_DEPARTMENT_OTHER): Payer: Medicare HMO

## 2017-05-10 ENCOUNTER — Ambulatory Visit: Payer: Medicare HMO

## 2017-05-10 ENCOUNTER — Encounter: Payer: Self-pay | Admitting: Hematology and Oncology

## 2017-05-10 ENCOUNTER — Ambulatory Visit (HOSPITAL_BASED_OUTPATIENT_CLINIC_OR_DEPARTMENT_OTHER): Payer: Medicare HMO | Admitting: Hematology and Oncology

## 2017-05-10 VITALS — BP 140/89 | HR 107 | Temp 98.9°F | Resp 18 | Ht 64.0 in | Wt 150.8 lb

## 2017-05-10 DIAGNOSIS — C7931 Secondary malignant neoplasm of brain: Secondary | ICD-10-CM

## 2017-05-10 DIAGNOSIS — T451X5A Adverse effect of antineoplastic and immunosuppressive drugs, initial encounter: Secondary | ICD-10-CM

## 2017-05-10 DIAGNOSIS — C3412 Malignant neoplasm of upper lobe, left bronchus or lung: Secondary | ICD-10-CM

## 2017-05-10 DIAGNOSIS — J449 Chronic obstructive pulmonary disease, unspecified: Secondary | ICD-10-CM

## 2017-05-10 DIAGNOSIS — Z5111 Encounter for antineoplastic chemotherapy: Secondary | ICD-10-CM

## 2017-05-10 DIAGNOSIS — Z720 Tobacco use: Secondary | ICD-10-CM

## 2017-05-10 DIAGNOSIS — C7949 Secondary malignant neoplasm of other parts of nervous system: Secondary | ICD-10-CM

## 2017-05-10 DIAGNOSIS — R05 Cough: Secondary | ICD-10-CM

## 2017-05-10 DIAGNOSIS — Z7189 Other specified counseling: Secondary | ICD-10-CM

## 2017-05-10 DIAGNOSIS — G620 Drug-induced polyneuropathy: Secondary | ICD-10-CM

## 2017-05-10 LAB — COMPREHENSIVE METABOLIC PANEL
ALBUMIN: 3 g/dL — AB (ref 3.5–5.0)
ALK PHOS: 131 U/L (ref 40–150)
ALT: <6 U/L (ref 0–55)
ANION GAP: 10 meq/L (ref 3–11)
AST: 8 U/L (ref 5–34)
BUN: 8.6 mg/dL (ref 7.0–26.0)
CO2: 24 mEq/L (ref 22–29)
Calcium: 9.1 mg/dL (ref 8.4–10.4)
Chloride: 109 mEq/L (ref 98–109)
Creatinine: 1 mg/dL (ref 0.6–1.1)
EGFR: >60 mL/min/{1.73_m2} (ref >60)
GLUCOSE: 94 mg/dL (ref 70–140)
POTASSIUM: 4 meq/L (ref 3.5–5.1)
SODIUM: 142 meq/L (ref 136–145)
Total Bilirubin: 0.53 mg/dL (ref 0.20–1.20)
Total Protein: 6.5 g/dL (ref 6.4–8.3)

## 2017-05-10 LAB — CBC WITH DIFFERENTIAL/PLATELET
BASO%: 0.4 % (ref 0.0–2.0)
BASOS ABS: 0 10*3/uL (ref 0.0–0.1)
EOS ABS: 0 10*3/uL (ref 0.0–0.5)
EOS%: 0.7 % (ref 0.0–7.0)
HCT: 36 % (ref 34.8–46.6)
HEMOGLOBIN: 11.5 g/dL — AB (ref 11.6–15.9)
LYMPH%: 17.1 % (ref 14.0–49.7)
MCH: 24 pg — AB (ref 25.1–34.0)
MCHC: 31.8 g/dL (ref 31.5–36.0)
MCV: 75.5 fL — AB (ref 79.5–101.0)
MONO#: 0.5 10*3/uL (ref 0.1–0.9)
MONO%: 10.7 % (ref 0.0–14.0)
NEUT%: 71.1 % (ref 38.4–76.8)
NEUTROS ABS: 3.4 10*3/uL (ref 1.5–6.5)
PLATELETS: 202 10*3/uL (ref 145–400)
RBC: 4.77 10*6/uL (ref 3.70–5.45)
RDW: 23.3 % — AB (ref 11.2–14.5)
WBC: 4.8 10*3/uL (ref 3.9–10.3)
lymph#: 0.8 10*3/uL — ABNORMAL LOW (ref 0.9–3.3)

## 2017-05-10 MED ORDER — DIPHENHYDRAMINE HCL 50 MG/ML IJ SOLN
50.0000 mg | Freq: Once | INTRAMUSCULAR | Status: AC
  Administered 2017-05-10: 50 mg via INTRAVENOUS

## 2017-05-10 MED ORDER — FAMOTIDINE IN NACL 20-0.9 MG/50ML-% IV SOLN
20.0000 mg | Freq: Once | INTRAVENOUS | Status: AC
  Administered 2017-05-10: 20 mg via INTRAVENOUS

## 2017-05-10 MED ORDER — DEXAMETHASONE SODIUM PHOSPHATE 10 MG/ML IJ SOLN
INTRAMUSCULAR | Status: AC
  Filled 2017-05-10: qty 1

## 2017-05-10 MED ORDER — DEXAMETHASONE SODIUM PHOSPHATE 10 MG/ML IJ SOLN
10.0000 mg | Freq: Once | INTRAMUSCULAR | Status: AC
Start: 2017-05-10 — End: 2017-05-10
  Administered 2017-05-10: 10 mg via INTRAVENOUS

## 2017-05-10 MED ORDER — DIPHENHYDRAMINE HCL 50 MG/ML IJ SOLN
INTRAMUSCULAR | Status: AC
  Filled 2017-05-10: qty 1

## 2017-05-10 MED ORDER — SODIUM CHLORIDE 0.9 % IJ SOLN
10.0000 mL | INTRAMUSCULAR | Status: DC | PRN
  Administered 2017-05-10: 10 mL via INTRAVENOUS
  Filled 2017-05-10: qty 10

## 2017-05-10 MED ORDER — FAMOTIDINE IN NACL 20-0.9 MG/50ML-% IV SOLN
INTRAVENOUS | Status: AC
  Filled 2017-05-10: qty 50

## 2017-05-10 MED ORDER — ONDANSETRON HCL 4 MG/2ML IJ SOLN
INTRAMUSCULAR | Status: AC
  Filled 2017-05-10: qty 4

## 2017-05-10 MED ORDER — SODIUM CHLORIDE 0.9% FLUSH
10.0000 mL | INTRAVENOUS | Status: DC | PRN
Start: 2017-05-10 — End: 2017-05-10
  Administered 2017-05-10: 10 mL
  Filled 2017-05-10: qty 10

## 2017-05-10 MED ORDER — SODIUM CHLORIDE 0.9 % IV SOLN
Freq: Once | INTRAVENOUS | Status: AC
  Administered 2017-05-10: 11:00:00 via INTRAVENOUS

## 2017-05-10 MED ORDER — LIDOCAINE-PRILOCAINE 2.5-2.5 % EX CREA: 1.0000 "application " | TOPICAL_CREAM | CUTANEOUS | 6 refills | Status: AC | PRN

## 2017-05-10 MED ORDER — ONDANSETRON HCL 4 MG/2ML IJ SOLN
8.0000 mg | Freq: Once | INTRAMUSCULAR | Status: AC
  Administered 2017-05-10: 8 mg via INTRAVENOUS

## 2017-05-10 MED ORDER — ONDANSETRON HCL 40 MG/20ML IJ SOLN: Freq: Once | INTRAMUSCULAR | Status: DC

## 2017-05-10 MED ORDER — SODIUM CHLORIDE 0.9 % IV SOLN
64.0000 mg/m2 | Freq: Once | INTRAVENOUS | Status: AC
  Administered 2017-05-10: 114 mg via INTRAVENOUS
  Filled 2017-05-10: qty 19

## 2017-05-10 MED ORDER — HEPARIN SOD (PORK) LOCK FLUSH 100 UNIT/ML IV SOLN
500.0000 [IU] | Freq: Once | INTRAVENOUS | Status: AC | PRN
  Administered 2017-05-10: 500 [IU]
  Filled 2017-05-10: qty 5

## 2017-05-10 NOTE — Assessment & Plan Note (Signed)
She continues to have mild productive cough with COPD She is not able to quit smoking She has been prescribed anti-tussives and other medication and she is on inhaler for COPD Continue conservative management

## 2017-05-10 NOTE — Patient Instructions (Signed)
[facility] Discharge Instructions for Patients Receiving Chemotherapy  Today you received the following chemotherapy agents:  Taxol.  To help prevent nausea and vomiting after your treatment, we encourage you to take your nausea medication as directed.   If you develop nausea and vomiting that is not controlled by your nausea medication, call the clinic.   BELOW ARE SYMPTOMS THAT SHOULD BE REPORTED IMMEDIATELY:  *FEVER GREATER THAN 100.5 F  *CHILLS WITH OR WITHOUT FEVER  NAUSEA AND VOMITING THAT IS NOT CONTROLLED WITH YOUR NAUSEA MEDICATION  *UNUSUAL SHORTNESS OF BREATH  *UNUSUAL BRUISING OR BLEEDING  TENDERNESS IN MOUTH AND THROAT WITH OR WITHOUT PRESENCE OF ULCERS  *URINARY PROBLEMS  *BOWEL PROBLEMS  UNUSUAL RASH Items with * indicate a potential emergency and should be followed up as soon as possible.  Feel free to call the clinic should you have any questions or concerns. The clinic phone number is [phone].  Please show the CHEMO ALERT CARD at check-in to the Emergency Department and triage nurse.   

## 2017-05-10 NOTE — Telephone Encounter (Signed)
Scheduled appt per 12/24 los - Pt aware of appts added - did not want AVS or calender printed

## 2017-05-10 NOTE — Assessment & Plan Note (Signed)
She was started on gabapentin with minimum benefit I recommend continue current dose of gabapentin at 600 mg twice a day

## 2017-05-10 NOTE — Assessment & Plan Note (Signed)
She tolerated treatment poorly with diarrhea and poor appetite Repeat CT imaging showed stable disease with positive response to treatment We have a long discussion regarding dosing of treatment Ultimately, the patient wants to keep on weekly schedule, to be treated on days 1, 8, 15 and rest day 22 I plan to continue on reduced dose of treatment due to peripheral neuropathy Plan to repeat imaging study next month to assess response to treatment again

## 2017-05-10 NOTE — Progress Notes (Signed)
[place] OFFICE PROGRESS NOTE  Patient Care Team: McDiarmid, Blane Ohara, MD as PCP - General (Family Medicine) Gaye Pollack, MD (Cardiothoracic Surgery) Dickie La, MD (Family Medicine) Heath Lark, MD as Consulting Physician (Hematology and Oncology) Pleasant, Eppie Gibson, RN as [place], [place], [place] (Optometry) Milus Banister, MD as Consulting Physician (Gastroenterology) Melida Quitter, MD as Consulting Physician (Otolaryngology)  SUMMARY OF ONCOLOGIC HISTORY: Oncology History   Colon cancer   Primary site: Colon and Rectum (Left)   Staging method: AJCC 7th Edition   Clinical: Stage I (T2, N0, M0) signed by Heath Lark, MD on 05/31/2013  2:39 PM   Pathologic: Stage I (T2, N0, cM0) signed by Heath Lark, MD on 05/31/2013  2:39 PM   Summary: Stage I (T2, N0, cM0) Lung cancer, EGFR/ALK negative, recurrence after initial resection to LN and brain   Primary site: Lung (Left)   Staging method: AJCC 7th Edition   Clinical: Stage IV (T1, N2, M1b) signed by Heath Lark, MD on 05/31/2013  2:26 PM   Pathologic: Stage IV (T1, N2, M1b) signed by Heath Lark, MD on 05/31/2013  2:26 PM   Summary: Stage IV (T1, N2, M1b)       Cancer of upper lobe of left lung, Adenocarcinoma   03/01/2007 Procedure    Colonoscopy revealed abnormalities and biopsy show high-grade dysplasia      04/01/2007 Surgery    She underwent sigmoid resection which showed T2 N0 colon cancer, and negative margins and all of 17 lymph nodes were negative      02/29/2008 Procedure    Repeat surveillance colonoscopy was negative.      06/16/2010 Surgery    She underwent left upper lobectomy we show well-differentiated adenocarcinoma of the lung, T1, N0, M0      03/18/2011 Procedure    Repeat colonoscopy show multiple polyps but there were benign      10/19/2011 Procedure    Biopsy of mediastinal lymph node came back positive for recurrence of lung cancer, EGFR and ALK  negative      11/16/2011 - 12/14/2011 Chemotherapy    She received concurrent chemoradiation therapy with weekly carboplatin and Taxol.      11/16/2011 - 12/29/2011 Radiation Therapy    She received radiation therapy with weekly chemotherapy      02/15/2012 Imaging    MR of the brain showed a new intracranial metastases. This was subsequently treated with stereotactic radiosurgery.      03/07/2012 - 04/12/2013 Chemotherapy    She received chemotherapy with maintainence Alimta every 3 weeks. Chemotherapy was discontinued due to profound fatigue      03/02/2013 Procedure    She had therapeutic ultrasound guidance thoracentesis for pleural effusion that came back negative for cancer      05/04/2013 Procedure    She had repeat ultrasound-guided thoracentesis again and cytology was negative      05/29/2013 Imaging    Repeat CT scan of the chest, abdomen and pelvis show no evidence of disease but persistent right-sided pleural effusion      06/02/2013 Surgery    The patient had placement of Pleurx catheter and subsequently underwent pleurodesis.      09/22/2013 Imaging    Repeat CT scan show no evidence of active disease. There are nonspecific lymphadenopathy and she is placed on observation.      03/23/2014 Imaging    Repeat CT scan of the chest, abdomen and pelvis show recurrence of cancer  with widespread bilateral pulmonary metastasis.      05/14/2014 Imaging    Imaging of the chest and brain were repeated due to delay of initiation of chemotherapy. Overall, chest CT scan show stable disease. MRI of the head was negative for recurrence      05/16/2014 - 07/18/2014 Chemotherapy     she completed 4 cycles of combination chemotherapy with carboplatin and Alimta      07/16/2014 Imaging     repeat CT scan of the chest, abdomen and pelvis show regression in the size of lung nodules.      08/29/2014 - 08/14/2015 Chemotherapy    She received maintenance Alimta      10/16/2014 Imaging      repeat CT scan of the chest, abdomen and pelvis show regression in the size of lung nodules.      01/29/2015 Imaging    Repeat CT scan showed stable disease      02/01/2015 Imaging    MRI brain is negative      04/30/2015 Imaging    CT scan of the chest, abdomen and pelvis showed stable disease      07/25/2015 Imaging    MRI brain showed no evidence of new disease      09/09/2015 Imaging    CT chest showed interval increase in size of large RIGHT upper lobe nodule is most consistent with lung cancer recurrence.      09/18/2015 - 03/26/2016 Chemotherapy    She started on Nivolumab      10/22/2015 Imaging    Screening mammogram showed mild abnormality      10/31/2015 Imaging    Diagnostic mammogram showed mild calcification at the right upper outer breast      11/27/2015 Imaging    Stable post treatment related changes of left lower lobectomy and radiation therapy redemonstrated, as above. No definite findings to suggest local recurrence of disease or metastatic disease on today's examination      01/24/2016 Imaging    MR brain showed continued stable appearance of two small treated brain metastases. No new or progressive metastatic disease to the brain.      04/15/2016 Imaging    Ct scan showed findings highly suspicious for recurrent tumor in the mediastinum surrounding the right mainstem bronchus and in the region of the azygoesophageal recess. Diffuse pulmonary metastatic disease with new and progressive nodules since the prior examination.      04/24/2016 - 12/18/2016 Chemotherapy    The patient had chemotherapy with gemzar and Carboplatin      04/30/2016 Imaging    Decreased contrast enhancement of the two treated metastases in the right frontal and left occipital lobes. No new lesions.      06/01/2016 Imaging    CXR showed numerous metastatic nodules have progressed compared to the most recent comparison chest x-ray of 07/15/2015. Majority of these were noted on the  04/15/2016 chest CT. No segmental infiltrate or pulmonary edema.      06/19/2016 Adverse Reaction    Treatment is placed on hold today due to neutropenia and the patient not feeling well      06/29/2016 Imaging    Interval decrease in size of metastatic pulmonary nodules. 2. Persistent but slightly improved right hilar/mediastinal disease. No progressive findings. 3. No findings for upper abdominal metastatic disease      10/01/2016 Imaging    Mild increase in size of 2.5 cm central right lower lobe pulmonary nodule, consistent with metastatic disease. Numerous other bilateral sub-cm pulmonary  nodules are stable.  Stable mild subcarinal and right cardiophrenic angle lymphadenopathy. Stable emphysema and right lung radiation changes. Stable hepatic steatosis. Aortic and coronary artery atherosclerosis.      11/06/2016 Imaging    MR brain: Stable treated RIGHT insula and LEFT occipital metastases.      12/30/2016 Imaging    1. Bilateral pulmonary nodules are minimally progressive from 10/01/2016. Dominant nodule in the superior segment right lower lobe is stable. 2. Aortic atherosclerosis (ICD10-170.0). Coronary artery calcification.      01/08/2017 - 01/08/2017 Chemotherapy    She received 1 dose of Abraxane, discontinued because of insurance coverage      02/23/2017 Imaging    Mild decrease in size of dominant pulmonary nodule in the central right lower lobe. Otherwise stable sub-cm bilateral pulmonary nodules and mild mediastinal lymphadenopathy. No new or progressive disease identified.  Stable right lung post radiation changes.  Aortic Atherosclerosis (ICD10-I70.0) and Emphysema (ICD10-J43.9).       Brain metastases treated with radiosurgery   02/26/2012 Initial Diagnosis    Brain metastases treated with radiosurgery       INTERVAL HISTORY: Please see below for problem oriented charting. She returns for further follow-up She continues to have chronic cough secondary to COPD  and smoking She denies worsening peripheral neuropathy She continues to have intermittent diarrhea, stable with dietary modification and Imodium as needed Denies recent headache or focal neurological deficit  REVIEW OF SYSTEMS:   Constitutional: Denies fevers, chills or abnormal weight loss Eyes: Denies blurriness of vision Ears, nose, mouth, throat, and face: Denies mucositis or sore throat Cardiovascular: Denies palpitation, chest discomfort or lower extremity swelling Skin: Denies abnormal skin rashes Lymphatics: Denies new lymphadenopathy or easy bruising Neurological:Denies numbness, tingling or new weaknesses Behavioral/Psych: Mood is stable, no new changes  All other systems were reviewed with the patient and are negative.  I have reviewed the past medical history, past surgical history, social history and family history with the patient and they are unchanged from previous note.  ALLERGIES:  is allergic to adhesive [tape] and lisinopril.  MEDICATIONS:  Current Outpatient Medications  Medication Sig Dispense Refill  . aspirin EC 81 MG tablet Take 81 mg by mouth daily.    . diclofenac sodium (VOLTAREN) 1 % GEL Apply 2 g topically 4 (four) times daily as needed. (Patient taking differently: Apply 2 g topically 4 (four) times daily as needed (pain). ) 100 g 1  . diphenoxylate-atropine (LOMOTIL) 2.5-0.025 MG tablet Take 1 tablet 4 (four) times daily as needed by mouth for diarrhea or loose stools. 30 tablet 0  . HYDROmorphone (DILAUDID) 8 MG tablet Take 1 tablet (8 mg total) by mouth every 6 (six) hours as needed for severe pain. 60 tablet 0  . ipratropium (ATROVENT HFA) 17 MCG/ACT inhaler Inhale 2 puffs into the lungs every 4 (four) hours as needed for wheezing (Cough). 3 Inhaler 3  . lidocaine-prilocaine (EMLA) cream Apply 1 application topically as needed. 30 g 6  . loratadine (CLARITIN) 10 MG tablet Take 10 mg by mouth daily.    Marland Kitchen LORazepam (ATIVAN) 0.5 MG tablet Take 1 tablet  (0.5 mg total) by mouth every 8 (eight) hours as needed for anxiety. 60 tablet 1  . traMADol (ULTRAM) 50 MG tablet Take 1 tablet (50 mg total) by mouth every 6 (six) hours as needed. 30 tablet 0  . umeclidinium-vilanterol (ANORO ELLIPTA) 62.5-25 MCG/INH AEPB Inhale 1 puff into the lungs daily. 3 each 3  . VENTOLIN HFA 108 (90  Base) MCG/ACT inhaler Inhale 2 puffs into the lungs every 4 (four) hours as needed for wheezing or shortness of breath. 3 Inhaler 3   No current facility-administered medications for this visit.    Facility-Administered Medications Ordered in Other Visits  Medication Dose Route Frequency Provider Last Rate Last Dose  . heparin lock flush 100 unit/mL  500 Units Intracatheter Once PRN Alvy Bimler, Zidan Helget, MD      . PACLitaxel (TAXOL) 114 mg in sodium chloride 0.9 % 250 mL chemo infusion (</= 33m/m2)  64 mg/m2 (Treatment Plan Recorded) Intravenous Once Bobetta Korf, MD      . sodium chloride 0.9 % injection 10 mL  10 mL Intravenous PRN GAlvy Bimler Layton Tappan, MD   10 mL at 09/18/15 1345  . sodium chloride 0.9 % injection 10 mL  10 mL Intravenous PRN GAlvy Bimler Caliber Landess, MD   10 mL at 04/24/16 1136  . sodium chloride 0.9 % injection 10 mL  10 mL Intravenous PRN GAlvy Bimler Jeanne Terrance, MD   10 mL at 01/15/17 1322  . sodium chloride 0.9 % injection 10 mL  10 mL Intravenous PRN GAlvy Bimler Keeanna Villafranca, MD   10 mL at 04/19/17 1255  . sodium chloride flush (NS) 0.9 % injection 10 mL  10 mL Intracatheter PRN GAlvy Bimler Alzora Ha, MD        PHYSICAL EXAMINATION: ECOG PERFORMANCE STATUS: 1 - Symptomatic but completely ambulatory  Vitals:   05/10/17 1009  BP: 140/89  Pulse: (!) 107  Resp: 18  Temp: 98.9 F (37.2 C)  SpO2: 100%   Filed Weights   05/10/17 1009  Weight: 150 lb 12.8 oz (68.4 kg)    GENERAL:alert, no distress and comfortable SKIN: skin color, texture, turgor are normal, no rashes or significant lesions EYES: normal, Conjunctiva are pink and non-injected, sclera clear OROPHARYNX:no exudate, no erythema and lips,  buccal mucosa, and tongue normal  NECK: supple, thyroid normal size, non-tender, without nodularity LYMPH:  no palpable lymphadenopathy in the cervical, axillary or inguinal LUNGS: clear to auscultation and percussion with normal breathing effort HEART: regular rate & rhythm and no murmurs and no lower extremity edema ABDOMEN:abdomen soft, non-tender and normal bowel sounds Musculoskeletal:no cyanosis of digits and no clubbing  NEURO: alert & oriented x 3 with fluent speech, no focal motor/sensory deficits  LABORATORY DATA:  I have reviewed the data as listed    Component Value Date/Time   NA 142 05/10/2017 0928   K 4.0 05/10/2017 0928   CL 107 01/25/2017 1743   CL 102 10/24/2012 1001   CO2 24 05/10/2017 0928   GLUCOSE 94 05/10/2017 0928   GLUCOSE 111 (H) 10/24/2012 1001   BUN 8.6 05/10/2017 0928   CREATININE 1.0 05/10/2017 0928   CALCIUM 9.1 05/10/2017 0928   PROT 6.5 05/10/2017 0928   ALBUMIN 3.0 (L) 05/10/2017 0928   AST 8 05/10/2017 0928   ALT <6 05/10/2017 0928   ALKPHOS 131 05/10/2017 0928   BILITOT 0.53 05/10/2017 0928   GFRNONAA >60 01/25/2017 1743   GFRAA >60 01/25/2017 1743    No results found for: SPEP, UPEP  Lab Results  Component Value Date   WBC 4.8 05/10/2017   NEUTROABS 3.4 05/10/2017   HGB 11.5 (L) 05/10/2017   HCT 36.0 05/10/2017   MCV 75.5 (L) 05/10/2017   PLT 202 05/10/2017      Chemistry      Component Value Date/Time   NA 142 05/10/2017 0928   K 4.0 05/10/2017 0928   CL 107 01/25/2017 1743   CL  102 10/24/2012 1001   CO2 24 05/10/2017 0928   BUN 8.6 05/10/2017 0928   CREATININE 1.0 05/10/2017 0928      Component Value Date/Time   CALCIUM 9.1 05/10/2017 0928   ALKPHOS 131 05/10/2017 0928   AST 8 05/10/2017 0928   ALT <6 05/10/2017 0928   BILITOT 0.53 05/10/2017 0928     ASSESSMENT & PLAN:  Cancer of upper lobe of left lung, Adenocarcinoma She tolerated treatment poorly with diarrhea and poor appetite Repeat CT imaging showed  stable disease with positive response to treatment We have a long discussion regarding dosing of treatment Ultimately, the patient wants to keep on weekly schedule, to be treated on days 1, 8, 15 and rest day 22 I plan to continue on reduced dose of treatment due to peripheral neuropathy Plan to repeat imaging study next month to assess response to treatment again  Neuropathy due to chemotherapeutic drug ([place]) She was started on gabapentin with minimum benefit I recommend continue current dose of gabapentin at 600 mg twice a day   COPD mixed type ([place]) She continues to have mild productive cough with COPD She is not able to quit smoking She has been prescribed anti-tussives and other medication and she is on inhaler for COPD Continue conservative management   Orders Placed This Encounter  Procedures  . CT CHEST W CONTRAST    Standing Status:   Future    Standing Expiration Date:   05/10/2018    Order Specific Question:   If indicated for the ordered procedure, I authorize the administration of contrast media per Radiology protocol    Answer:   Yes    Order Specific Question:   Preferred imaging location?    Answer:   [facility]    Order Specific Question:   Radiology Contrast Protocol - do NOT remove file path    Answer:   [email or web address]   All questions were answered. The patient knows to call the clinic with any problems, questions or concerns. No barriers to learning was detected. I spent 15 minutes counseling the patient face to face. The total time spent in the appointment was 20 minutes and more than 50% was on counseling and review of test results     Heath Lark, MD 05/10/2017 12:15 PM

## 2017-05-12 ENCOUNTER — Ambulatory Visit: Payer: Self-pay | Admitting: *Deleted

## 2017-05-12 NOTE — Patient Outreach (Signed)
[facility]) Care Management  05/12/2017   AXIE HAYNE Feb 11, 1951 [id number]  Subjective: RN Health Coach telephone call to patient.  Hipaa compliance verified. Per patient she is going to the Dr next week. Per patient the Dr stated that they would do imaging to see how the treatment is working. Patient stated that she is getting more short of breath with exertion. Per patient she can stay up about 2 hours with have to stop and rest. Per patient if she found that the results are better she would like to get into an exercise program at the [facility]. RN discussed chair exercises. Patient stated that she is eating better with the meals on wheels. Per patient she is not in much pain.. Patient is taking her medications better. Patient is needing more in the way of support. Patient is going to the Cancer counselor. Patient has agreed to follow up outreach calls.  Current Medications:  Current Outpatient Medications  Medication Sig Dispense Refill  . aspirin EC 81 MG tablet Take 81 mg by mouth daily.    . diclofenac sodium (VOLTAREN) 1 % GEL Apply 2 g topically 4 (four) times daily as needed. (Patient taking differently: Apply 2 g topically 4 (four) times daily as needed (pain). ) 100 g 1  . diphenoxylate-atropine (LOMOTIL) 2.5-0.025 MG tablet Take 1 tablet 4 (four) times daily as needed by mouth for diarrhea or loose stools. 30 tablet 0  . HYDROmorphone (DILAUDID) 8 MG tablet Take 1 tablet (8 mg total) by mouth every 6 (six) hours as needed for severe pain. 60 tablet 0  . ipratropium (ATROVENT HFA) 17 MCG/ACT inhaler Inhale 2 puffs into the lungs every 4 (four) hours as needed for wheezing (Cough). 3 Inhaler 3  . lidocaine-prilocaine (EMLA) cream Apply 1 application topically as needed. 30 g 6  . loratadine (CLARITIN) 10 MG tablet Take 10 mg by mouth daily.    Marland Kitchen LORazepam (ATIVAN) 0.5 MG tablet Take 1 tablet (0.5 mg total) by mouth every 8 (eight) hours as needed for anxiety. 60  tablet 1  . traMADol (ULTRAM) 50 MG tablet Take 1 tablet (50 mg total) by mouth every 6 (six) hours as needed. 30 tablet 0  . umeclidinium-vilanterol (ANORO ELLIPTA) 62.5-25 MCG/INH AEPB Inhale 1 puff into the lungs daily. 3 each 3  . VENTOLIN HFA 108 (90 Base) MCG/ACT inhaler Inhale 2 puffs into the lungs every 4 (four) hours as needed for wheezing or shortness of breath. 3 Inhaler 3   No current facility-administered medications for this visit.    Facility-Administered Medications Ordered in Other Visits  Medication Dose Route Frequency Provider Last Rate Last Dose  . sodium chloride 0.9 % injection 10 mL  10 mL Intravenous PRN Alvy Bimler, Ni, MD   10 mL at 09/18/15 1345  . sodium chloride 0.9 % injection 10 mL  10 mL Intravenous PRN Alvy Bimler, Ni, MD   10 mL at 04/24/16 1136  . sodium chloride 0.9 % injection 10 mL  10 mL Intravenous PRN Alvy Bimler, Ni, MD   10 mL at 01/15/17 1322  . sodium chloride 0.9 % injection 10 mL  10 mL Intravenous PRN Heath Lark, MD   10 mL at 04/19/17 1255    Functional Status:  In your present state of health, do you have any difficulty performing the following activities: 05/12/2017 04/13/2017  Hearing? N N  Vision? Y Y  Difficulty concentrating or making decisions? [name]  Walking or climbing stairs? [name]  Dressing  or bathing? N N  Doing errands, shopping? [name]  Preparing Food and eating ? N N  Using the Toilet? N N  In the past six months, have you accidently leaked urine? Y Y  Do you have problems with loss of bowel control? N N  Managing your Medications? N N  Managing your Finances? N N  Housekeeping or managing your Housekeeping? Y Y  Some recent data might be hidden    Fall/Depression Screening: Fall Risk  05/12/2017 04/13/2017 03/11/2017  Falls in the past year? No No No  Number falls in past yr: 1 - 1  Comment - - -  Injury with Fall? No - No  Comment - - -  Risk Factor Category  High Fall Risk - High Fall Risk  Risk for fall due to : History of  fall(s) History of fall(s);Impaired balance/gait;Impaired mobility;Medication side effect Impaired balance/gait;History of fall(s);Impaired mobility  Risk for fall due to: Comment - - -  Follow up Falls evaluation completed;Education provided;Falls prevention discussed - Falls evaluation completed;Education provided;Falls prevention discussed   PHQ 2/9 Scores 05/12/2017 04/13/2017 03/11/2017 03/11/2017 01/27/2017 01/01/2017 12/31/2016  PHQ - 2 Score 0 0 0 0 1 1 1   PHQ- 9 Score - - - - - - -  Exception Documentation - - - - - - -    Assessment:  Patient is having increase shortness of breath with exertion Patient is expressing feelings more about death and dying  Plan:  RN will follow up with timeframe on Meals on wheels RN will send additional cases of supplemental nutrition RN will follow up with patient on results of test Patient has follow up with PCP 06/17/2017 Patient has follow up with oncologist 06/07/2017 RN will follow up outreach within the month of January.  [place] Care Management [phone]

## 2017-05-13 ENCOUNTER — Other Ambulatory Visit: Payer: Self-pay | Admitting: *Deleted

## 2017-05-13 DIAGNOSIS — R5383 Other fatigue: Secondary | ICD-10-CM | POA: Diagnosis not present

## 2017-05-17 ENCOUNTER — Ambulatory Visit (HOSPITAL_BASED_OUTPATIENT_CLINIC_OR_DEPARTMENT_OTHER): Payer: Medicare HMO

## 2017-05-17 ENCOUNTER — Other Ambulatory Visit (HOSPITAL_BASED_OUTPATIENT_CLINIC_OR_DEPARTMENT_OTHER): Payer: Medicare HMO

## 2017-05-17 VITALS — BP 127/65 | HR 107 | Temp 98.2°F | Resp 16

## 2017-05-17 DIAGNOSIS — C7931 Secondary malignant neoplasm of brain: Secondary | ICD-10-CM | POA: Diagnosis not present

## 2017-05-17 DIAGNOSIS — C3412 Malignant neoplasm of upper lobe, left bronchus or lung: Secondary | ICD-10-CM

## 2017-05-17 DIAGNOSIS — C7949 Secondary malignant neoplasm of other parts of nervous system: Secondary | ICD-10-CM

## 2017-05-17 DIAGNOSIS — Z5111 Encounter for antineoplastic chemotherapy: Secondary | ICD-10-CM | POA: Diagnosis not present

## 2017-05-17 DIAGNOSIS — Z7189 Other specified counseling: Secondary | ICD-10-CM

## 2017-05-17 LAB — COMPREHENSIVE METABOLIC PANEL
ALT: 6 U/L (ref 0–55)
ANION GAP: 8 meq/L (ref 3–11)
AST: 10 U/L (ref 5–34)
Albumin: 3.1 g/dL — ABNORMAL LOW (ref 3.5–5.0)
Alkaline Phosphatase: 126 U/L (ref 40–150)
BUN: 10.3 mg/dL (ref 7.0–26.0)
CHLORIDE: 105 meq/L (ref 98–109)
CO2: 26 meq/L (ref 22–29)
Calcium: 9.1 mg/dL (ref 8.4–10.4)
Creatinine: 1 mg/dL (ref 0.6–1.1)
GLUCOSE: 95 mg/dL (ref 70–140)
Potassium: 3.4 mEq/L — ABNORMAL LOW (ref 3.5–5.1)
SODIUM: 139 meq/L (ref 136–145)
Total Bilirubin: 0.63 mg/dL (ref 0.20–1.20)
Total Protein: 6.6 g/dL (ref 6.4–8.3)

## 2017-05-17 LAB — CBC WITH DIFFERENTIAL/PLATELET
BASO%: 0.6 % (ref 0.0–2.0)
BASOS ABS: 0 10*3/uL (ref 0.0–0.1)
EOS ABS: 0 10*3/uL (ref 0.0–0.5)
EOS%: 0.9 % (ref 0.0–7.0)
HEMATOCRIT: 35.7 % (ref 34.8–46.6)
HEMOGLOBIN: 11.1 g/dL — AB (ref 11.6–15.9)
LYMPH#: 0.8 10*3/uL — AB (ref 0.9–3.3)
LYMPH%: 23.9 % (ref 14.0–49.7)
MCH: 24.1 pg — ABNORMAL LOW (ref 25.1–34.0)
MCHC: 31.1 g/dL — ABNORMAL LOW (ref 31.5–36.0)
MCV: 77.6 fL — AB (ref 79.5–101.0)
MONO#: 0.2 10*3/uL (ref 0.1–0.9)
MONO%: 6.7 % (ref 0.0–14.0)
NEUT#: 2.2 10*3/uL (ref 1.5–6.5)
NEUT%: 67.9 % (ref 38.4–76.8)
NRBC: 0 % (ref 0–0)
PLATELETS: 158 10*3/uL (ref 145–400)
RBC: 4.6 10*6/uL (ref 3.70–5.45)
RDW: 20.7 % — AB (ref 11.2–14.5)
WBC: 3.3 10*3/uL — ABNORMAL LOW (ref 3.9–10.3)

## 2017-05-17 MED ORDER — FAMOTIDINE IN NACL 20-0.9 MG/50ML-% IV SOLN
20.0000 mg | Freq: Once | INTRAVENOUS | Status: AC
  Administered 2017-05-17: 20 mg via INTRAVENOUS

## 2017-05-17 MED ORDER — HEPARIN SOD (PORK) LOCK FLUSH 100 UNIT/ML IV SOLN
500.0000 [IU] | Freq: Once | INTRAVENOUS | Status: AC | PRN
  Administered 2017-05-17: 500 [IU]
  Filled 2017-05-17: qty 5

## 2017-05-17 MED ORDER — DEXAMETHASONE SODIUM PHOSPHATE 10 MG/ML IJ SOLN
INTRAMUSCULAR | Status: AC
  Filled 2017-05-17: qty 1

## 2017-05-17 MED ORDER — SODIUM CHLORIDE 0.9 % IV SOLN
Freq: Once | INTRAVENOUS | Status: AC
  Administered 2017-05-17: 14:00:00 via INTRAVENOUS

## 2017-05-17 MED ORDER — SODIUM CHLORIDE 0.9 % IV SOLN
64.0000 mg/m2 | Freq: Once | INTRAVENOUS | Status: AC
  Administered 2017-05-17: 114 mg via INTRAVENOUS
  Filled 2017-05-17: qty 19

## 2017-05-17 MED ORDER — ONDANSETRON HCL 4 MG/2ML IJ SOLN
INTRAMUSCULAR | Status: AC
Start: 2017-05-17 — End: 2017-05-17
  Filled 2017-05-17: qty 4

## 2017-05-17 MED ORDER — SODIUM CHLORIDE 0.9 % IV SOLN: Freq: Once | INTRAVENOUS | Status: DC

## 2017-05-17 MED ORDER — DEXAMETHASONE SODIUM PHOSPHATE 10 MG/ML IJ SOLN
10.0000 mg | Freq: Once | INTRAMUSCULAR | Status: AC
  Administered 2017-05-17: 10 mg via INTRAVENOUS

## 2017-05-17 MED ORDER — ONDANSETRON HCL 4 MG/2ML IJ SOLN
8.0000 mg | Freq: Once | INTRAMUSCULAR | Status: AC
  Administered 2017-05-17: 8 mg via INTRAVENOUS

## 2017-05-17 MED ORDER — FAMOTIDINE IN NACL 20-0.9 MG/50ML-% IV SOLN
INTRAVENOUS | Status: AC
  Filled 2017-05-17: qty 50

## 2017-05-17 MED ORDER — SODIUM CHLORIDE 0.9% FLUSH
10.0000 mL | INTRAVENOUS | Status: DC | PRN
  Administered 2017-05-17: 10 mL
  Filled 2017-05-17: qty 10

## 2017-05-17 MED ORDER — DIPHENHYDRAMINE HCL 50 MG/ML IJ SOLN
50.0000 mg | Freq: Once | INTRAMUSCULAR | Status: AC
  Administered 2017-05-17: 50 mg via INTRAVENOUS

## 2017-05-17 MED ORDER — DIPHENHYDRAMINE HCL 50 MG/ML IJ SOLN
INTRAMUSCULAR | Status: AC
  Filled 2017-05-17: qty 1

## 2017-05-17 NOTE — Progress Notes (Signed)
Pt's HR at 110's  Okay to treat per Dr. Lindi Adie.

## 2017-05-17 NOTE — Patient Instructions (Signed)
[facility] Discharge Instructions for Patients Receiving Chemotherapy  Today you received the following chemotherapy agents:  Taxol.  To help prevent nausea and vomiting after your treatment, we encourage you to take your nausea medication as directed.   If you develop nausea and vomiting that is not controlled by your nausea medication, call the clinic.   BELOW ARE SYMPTOMS THAT SHOULD BE REPORTED IMMEDIATELY:  *FEVER GREATER THAN 100.5 F  *CHILLS WITH OR WITHOUT FEVER  NAUSEA AND VOMITING THAT IS NOT CONTROLLED WITH YOUR NAUSEA MEDICATION  *UNUSUAL SHORTNESS OF BREATH  *UNUSUAL BRUISING OR BLEEDING  TENDERNESS IN MOUTH AND THROAT WITH OR WITHOUT PRESENCE OF ULCERS  *URINARY PROBLEMS  *BOWEL PROBLEMS  UNUSUAL RASH Items with * indicate a potential emergency and should be followed up as soon as possible.  Feel free to call the clinic should you have any questions or concerns. The clinic phone number is [phone].  Please show the CHEMO ALERT CARD at check-in to the Emergency Department and triage nurse.   

## 2017-05-20 ENCOUNTER — Telehealth: Payer: Self-pay

## 2017-05-20 ENCOUNTER — Other Ambulatory Visit: Payer: Self-pay | Admitting: *Deleted

## 2017-05-20 ENCOUNTER — Telehealth: Payer: Self-pay | Admitting: *Deleted

## 2017-05-20 DIAGNOSIS — F419 Anxiety disorder, unspecified: Secondary | ICD-10-CM | POA: Diagnosis not present

## 2017-05-20 NOTE — Telephone Encounter (Signed)
Noted and agree.  I look forward to seeing tomorrow.

## 2017-05-20 NOTE — Patient Outreach (Signed)
[facility]) Care Management  05/20/2017  Lauren [name] [birth year] [id number]   RN Health Coach received telephone call from patient.  Hipaa compliance verified. Per patient stated she went to sleep and was awakened by her family calling. Patient thought it was the next day. Patient could not understand that it was the evening of the same day. Per patient her family has noticed some changes in her. Patient is having difficulty remembering. Per patient at times her head feels foggy.  RN Health Coach had talked with patient on May 13, 2017 and made patient aware that meals on wheels would continue for another month and that two cases of ensure had been ordered for her. Patient does not remember the conversation. Patient stated she is suppose to have scans ordered by her oncologist. Per patient they have not called her yet for the date of the scans.  She is to see Dr Alvy Bimler her oncologist on the twenty first.   Plan: Patient will call her oncologist and make her aware of the above information Patient will check on the date of the scan to be done Patient will make her palliative care nurse aware of the above information RN will follow up outreach within month of January and be available for [place] Management [phone]

## 2017-05-20 NOTE — Telephone Encounter (Signed)
Multiple calls through out the day between pt, this RN and Enid Derry, Palliative care home health nurse. Pt first called in AM and lvm stating she was feeling confused and lethargic.  This RN returned called and spoke with pt by phone.  Pt stated that she slept all day yesterday and when a friend called her yesterday evening and woke her up she thought it was the next day.  Today she does not feel confused, just tired and feels like it takes her longer to respond and answer questions.  Pt's speech is clear, no slurring of words, and she answered all questions appropriately.  Pt reports no headache or vision changes.  No fevers, no diarrhea.  Eating and drinking "ok, but not a lot".  Pt said the Palliative Care nurse was on the way over to see her.  This RN spoke with Enid Derry, NP for Palliative Care after her visit with pt.  She states pt appears more tired than normal but not confused.  No other symptoms, except that when nurse got ready to leave pt appeared to have a panic attack stating that she could hear her mother (who is deceased) calling her to "come home".  Per Enid Derry, pt stated she does not want to go to the hospital and if she were to have cardiac or respiratory arrest she did not want to be revived.  Pt is prescribed ativan for anxiety but stated she has not been taking it.  Enid Derry called pt's sister who said that she would come stay with the patient. Enid Derry also made an appt for pt to see her PCP tomorrow, and said she would reach out to pt's dtr.  This RN called pt back to check on her.  Pt did not sound like she was anxious or in any distress at that time.  When asked how she was feeling - any better, any worse, or about the same - pt stated "a little better".  Pt confirmed that her sister was on her way over and that she would be going to see her PCP tomorrow.  This RN instructed pt it is not abnormal to feel very tired a few days after chemo and that if she just needs to sleep that is OK.  Pt  instructed to call if she starts to feel worse and that our office will call to check on her tomorrow.

## 2017-05-20 NOTE — Telephone Encounter (Signed)
Spoke with Enid Derry, patient's Palliative Care Nurse Practitioner. States patient has been extremely tired. Slept all day yesterday and did not wake to eat. Patient states she's feeling "different." Enid Derry reports diminished breath sounds RLL which is not abnormal for her. SpO2 99% P 99 and regular. Patient also "really anxious" and having difficulty with memory. No symptoms of CVA. Has not had previous pneumonia or UTI. Patient does not want to go to hospital. SD appt given tomorrow, 05/21/2017 at 1130. Hubbard Hartshorn, RN, BSN

## 2017-05-21 ENCOUNTER — Other Ambulatory Visit: Payer: Self-pay

## 2017-05-21 ENCOUNTER — Telehealth: Payer: Self-pay | Admitting: *Deleted

## 2017-05-21 ENCOUNTER — Encounter: Payer: Self-pay | Admitting: Family Medicine

## 2017-05-21 ENCOUNTER — Ambulatory Visit (INDEPENDENT_AMBULATORY_CARE_PROVIDER_SITE_OTHER): Payer: Medicare HMO | Admitting: Family Medicine

## 2017-05-21 DIAGNOSIS — C7949 Secondary malignant neoplasm of other parts of nervous system: Secondary | ICD-10-CM

## 2017-05-21 DIAGNOSIS — Z7189 Other specified counseling: Secondary | ICD-10-CM

## 2017-05-21 DIAGNOSIS — C7931 Secondary malignant neoplasm of brain: Secondary | ICD-10-CM | POA: Diagnosis not present

## 2017-05-21 DIAGNOSIS — C3412 Malignant neoplasm of upper lobe, left bronchus or lung: Secondary | ICD-10-CM | POA: Diagnosis not present

## 2017-05-21 NOTE — Telephone Encounter (Signed)
Mrs Eisenhower is OK to wait until February for CT

## 2017-05-21 NOTE — Assessment & Plan Note (Signed)
Based on the triage note of yesterday, I worried about worsening CNS pressure and need for imaging/decadron.  However, feels so good today, I will not go down that road.

## 2017-05-21 NOTE — Assessment & Plan Note (Signed)
Most likely explanation in my mind for her 2 days of weakness is from her chemotherapy.  She did have this once previously.  She will get chemo again on Monday which should help [place] with cause and effect.  If has another bad Weds/Thursday, she should discuss with oncology about adjusting the dose.

## 2017-05-21 NOTE — Progress Notes (Signed)
   Subjective:    Patient ID: Lauren [name], female    DOB: July 05, 1950, 67 y.o.   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  Chemo is three Mondays on, one off.  Had chemo Monday.  Weds and Thursday felt quite weak and was a bit disoriented.  Today feels stronger and clear mentally.  Denies fever, chills, cough or focal neuro problems.  No sick contacts.  Poor appetite those two days but is quite [place] today.   Denies headache, blurred vision.  Sister who is with her states no seizure activity.   Review of Systems     Objective:   Physical Exam Note, weight seems to be a bit lower than her baseline.   Lungs clear Cardiac RRR without m or g Abd benign Neuro normal gait and mentation.       Assessment & Plan:

## 2017-05-21 NOTE — Telephone Encounter (Signed)
-----   Message from Heath Lark, MD sent at 05/20/2017  1:09 PM EST ----- Regarding: RE: CT denial  [name],  Please call the patient. Tell her I would probable prefer to wait till Feb to do the scan due to insurance issue. If OK with her, please reply to all of [place]   ----- Message ----- From: Carolan Clines Sent: 05/20/2017  12:32 PM To: Heath Lark, MD, Truitt Merle, MD Subject: RE: CT denial                                  I apologize, [employer] would not accept Dr. Alvy Bimler NPI number yesterday while I was submitting clinicals so I used Dr. Ernestina Penna name.  Dr. Alvy Bimler, let me know how you like like to proceed. If we wait until mid Feb, we should be able to get CT approved.    ----- Message ----- From: Truitt Merle, MD Sent: 05/20/2017  12:28 PM To: Carolan Clines, [place], RN, [place] Simon Rhein, RN, # Subject: RE: CT denial                                  Ni, this is your pt  Lauren [name]  ----- Message ----- From: Carolan Clines Sent: 05/20/2017   9:43 AM To: Jesse Fall, RN, [place] Simon Rhein, RN, # Subject: CT denial                                      Hello Dr. Burr Medico,   I was unable to get the CT chest approved with [facility].  Please see peer to peer information below.   Insurance company: [employer] Phone: [phone] Case #: [id number] Date of Birth:Apr 15, 1951 [employer] ID # [id number]  REASON: Case failed criteria due to frequency. Patient has had more that 4 CT chest in a 12 month timeframe. If patient needs CT done, a peer/peer will be required.    Please let me know how you want to proceed.   Thank you,  Tia C. Hill

## 2017-05-21 NOTE — Assessment & Plan Note (Signed)
Discussed.  She has good quality of life and would like to maintain as long as possible.  And, she is not afraid of dying.  She wants hospice and dying at home.  Does not seem to be in a rapid decline phase.  She is wants to continue chemo as long as the risk/benefit ratio is good - which it has been up to now.

## 2017-05-21 NOTE — Patient Instructions (Signed)
I am glad you are feeling better. I believe that you were bad due to the chemo. You should feel better with each passing day - until your chemo next Monday.   If it also knocks you down, talk to your cancer doctor about adjusting the dose or frequency of the chemo.

## 2017-05-24 ENCOUNTER — Inpatient Hospital Stay: Payer: Medicare HMO

## 2017-05-24 ENCOUNTER — Inpatient Hospital Stay: Payer: Medicare HMO | Attending: Hematology and Oncology

## 2017-05-24 VITALS — BP 134/79 | HR 104 | Temp 98.3°F

## 2017-05-24 DIAGNOSIS — C3412 Malignant neoplasm of upper lobe, left bronchus or lung: Secondary | ICD-10-CM | POA: Insufficient documentation

## 2017-05-24 DIAGNOSIS — Z5111 Encounter for antineoplastic chemotherapy: Secondary | ICD-10-CM | POA: Insufficient documentation

## 2017-05-24 DIAGNOSIS — Z7189 Other specified counseling: Secondary | ICD-10-CM

## 2017-05-24 DIAGNOSIS — R531 Weakness: Secondary | ICD-10-CM | POA: Diagnosis not present

## 2017-05-24 DIAGNOSIS — Z79899 Other long term (current) drug therapy: Secondary | ICD-10-CM | POA: Diagnosis not present

## 2017-05-24 DIAGNOSIS — C7931 Secondary malignant neoplasm of brain: Secondary | ICD-10-CM | POA: Insufficient documentation

## 2017-05-24 DIAGNOSIS — Z7982 Long term (current) use of aspirin: Secondary | ICD-10-CM | POA: Diagnosis not present

## 2017-05-24 DIAGNOSIS — C7949 Secondary malignant neoplasm of other parts of nervous system: Secondary | ICD-10-CM

## 2017-05-24 DIAGNOSIS — C7800 Secondary malignant neoplasm of unspecified lung: Secondary | ICD-10-CM | POA: Diagnosis not present

## 2017-05-24 DIAGNOSIS — G893 Neoplasm related pain (acute) (chronic): Secondary | ICD-10-CM | POA: Diagnosis not present

## 2017-05-24 DIAGNOSIS — R197 Diarrhea, unspecified: Secondary | ICD-10-CM | POA: Insufficient documentation

## 2017-05-24 DIAGNOSIS — R432 Parageusia: Secondary | ICD-10-CM | POA: Diagnosis not present

## 2017-05-24 LAB — COMPREHENSIVE METABOLIC PANEL
ALT: 6 U/L (ref 0–55)
AST: 9 U/L (ref 5–34)
Albumin: 2.9 g/dL — ABNORMAL LOW (ref 3.5–5.0)
Alkaline Phosphatase: 112 U/L (ref 40–150)
Anion gap: 8 (ref 3–11)
BUN: 10 mg/dL (ref 7–26)
CO2: 23 mmol/L (ref 22–29)
Calcium: 8.9 mg/dL (ref 8.4–10.4)
Chloride: 109 mmol/L (ref 98–109)
Creatinine, Ser: 0.98 mg/dL (ref 0.60–1.10)
GFR calc Af Amer: >60 mL/min (ref >60)
GFR calc non Af Amer: 58 mL/min — ABNORMAL LOW (ref >60)
Glucose, Bld: 101 mg/dL (ref 70–140)
Potassium: 4.1 mmol/L (ref 3.3–4.7)
Sodium: 140 mmol/L (ref 136–145)
Total Bilirubin: 0.4 mg/dL (ref 0.2–1.2)
Total Protein: 6.5 g/dL (ref 6.4–8.3)

## 2017-05-24 LAB — CBC WITH DIFFERENTIAL/PLATELET
Abs Granulocyte: 1.8 10*3/uL (ref 1.5–6.5)
BASOS ABS: 0 10*3/uL (ref 0.0–0.1)
BASOS PCT: 0 %
EOS ABS: 0 10*3/uL (ref 0.0–0.5)
EOS PCT: 2 %
HCT: 35.6 % (ref 34.8–46.6)
Hemoglobin: 11 g/dL — ABNORMAL LOW (ref 11.6–15.9)
Lymphocytes Relative: 23 %
Lymphs Abs: 0.6 10*3/uL — ABNORMAL LOW (ref 0.9–3.3)
MCH: 24.3 pg — AB (ref 25.1–34.0)
MCHC: 30.9 g/dL — ABNORMAL LOW (ref 31.5–36.0)
MCV: 78.6 fL — ABNORMAL LOW (ref 79.5–101.0)
Monocytes Absolute: 0.2 10*3/uL (ref 0.1–0.9)
Monocytes Relative: 8 %
NEUTROS PCT: 67 %
Neutro Abs: 1.8 10*3/uL (ref 1.5–6.5)
PLATELETS: 169 10*3/uL (ref 145–400)
RBC: 4.53 MIL/uL (ref 3.70–5.45)
RDW: 21.4 % — ABNORMAL HIGH (ref 11.2–16.1)
WBC: 2.6 10*3/uL — AB (ref 3.9–10.3)

## 2017-05-24 MED ORDER — DIPHENHYDRAMINE HCL 50 MG/ML IJ SOLN
50.0000 mg | Freq: Once | INTRAMUSCULAR | Status: AC
  Administered 2017-05-24: 50 mg via INTRAVENOUS

## 2017-05-24 MED ORDER — DEXAMETHASONE SODIUM PHOSPHATE 10 MG/ML IJ SOLN
10.0000 mg | Freq: Once | INTRAMUSCULAR | Status: AC
  Administered 2017-05-24: 10 mg via INTRAVENOUS

## 2017-05-24 MED ORDER — SODIUM CHLORIDE 0.9 % IV SOLN
Freq: Once | INTRAVENOUS | Status: AC
  Administered 2017-05-24: 09:00:00 via INTRAVENOUS

## 2017-05-24 MED ORDER — ONDANSETRON HCL 4 MG/2ML IJ SOLN
8.0000 mg | Freq: Once | INTRAMUSCULAR | Status: AC
  Administered 2017-05-24: 8 mg via INTRAVENOUS

## 2017-05-24 MED ORDER — HEPARIN SOD (PORK) LOCK FLUSH 100 UNIT/ML IV SOLN
500.0000 [IU] | Freq: Once | INTRAVENOUS | Status: AC | PRN
  Administered 2017-05-24: 500 [IU]
  Filled 2017-05-24: qty 5

## 2017-05-24 MED ORDER — FAMOTIDINE IN NACL 20-0.9 MG/50ML-% IV SOLN
20.0000 mg | Freq: Once | INTRAVENOUS | Status: AC
  Administered 2017-05-24: 20 mg via INTRAVENOUS

## 2017-05-24 MED ORDER — SODIUM CHLORIDE 0.9 % IV SOLN
64.0000 mg/m2 | Freq: Once | INTRAVENOUS | Status: AC
  Administered 2017-05-24: 114 mg via INTRAVENOUS
  Filled 2017-05-24: qty 19

## 2017-05-24 MED ORDER — FAMOTIDINE IN NACL 20-0.9 MG/50ML-% IV SOLN
INTRAVENOUS | Status: AC
  Filled 2017-05-24: qty 50

## 2017-05-24 MED ORDER — ONDANSETRON HCL 4 MG/2ML IJ SOLN
INTRAMUSCULAR | Status: AC
  Filled 2017-05-24: qty 4

## 2017-05-24 MED ORDER — DEXAMETHASONE SODIUM PHOSPHATE 10 MG/ML IJ SOLN
INTRAMUSCULAR | Status: AC
  Filled 2017-05-24: qty 1

## 2017-05-24 MED ORDER — SODIUM CHLORIDE 0.9% FLUSH
10.0000 mL | INTRAVENOUS | Status: DC | PRN
  Administered 2017-05-24: 10 mL
  Filled 2017-05-24: qty 10

## 2017-05-24 MED ORDER — DIPHENHYDRAMINE HCL 50 MG/ML IJ SOLN
INTRAMUSCULAR | Status: AC
  Filled 2017-05-24: qty 1

## 2017-05-24 NOTE — Patient Instructions (Signed)
[facility] Discharge Instructions for Patients Receiving Chemotherapy  Today you received the following chemotherapy agents TAXOL.   To help prevent nausea and vomiting after your treatment, we encourage you to take your nausea medication as prescribed.   If you develop nausea and vomiting that is not controlled by your nausea medication, call the clinic.   BELOW ARE SYMPTOMS THAT SHOULD BE REPORTED IMMEDIATELY:  *FEVER GREATER THAN 100.5 F  *CHILLS WITH OR WITHOUT FEVER  NAUSEA AND VOMITING THAT IS NOT CONTROLLED WITH YOUR NAUSEA MEDICATION  *UNUSUAL SHORTNESS OF BREATH  *UNUSUAL BRUISING OR BLEEDING  TENDERNESS IN MOUTH AND THROAT WITH OR WITHOUT PRESENCE OF ULCERS  *URINARY PROBLEMS  *BOWEL PROBLEMS  UNUSUAL RASH Items with * indicate a potential emergency and should be followed up as soon as possible.  Feel free to call the clinic should you have any questions or concerns. The clinic phone number is [phone].  Please show the [place] at check-in to the Emergency Department and triage nurse.

## 2017-05-31 ENCOUNTER — Other Ambulatory Visit: Payer: Self-pay | Admitting: *Deleted

## 2017-05-31 ENCOUNTER — Other Ambulatory Visit: Payer: Self-pay

## 2017-05-31 ENCOUNTER — Telehealth: Payer: Self-pay

## 2017-05-31 MED ORDER — DIPHENOXYLATE-ATROPINE 2.5-0.025 MG PO TABS: 1.0000 | ORAL_TABLET | Freq: Four times a day (QID) | ORAL | 0 refills | Status: AC | PRN

## 2017-05-31 MED FILL — LIDOCAINE-PRILOCAINE CREAM: 2.5-2.5 | 30 days supply | Qty: 30 | Fill #0

## 2017-05-31 MED FILL — DIPHENOXYLATE-ATROP 2.5-0.0: 2.5-0.025 | 7 days supply | Qty: 30 | Fill #0

## 2017-05-31 NOTE — Telephone Encounter (Signed)
Maximum for Lomotil is 20 mg per day and imodium is 16 mg per day.  She can take alternate 1 tab of lomotil then alternate with 4 mg Imodium

## 2017-05-31 NOTE — Patient Outreach (Signed)
[facility]) Care Management  05/31/2017  Lauren [name] [id number] [id number]   RN Health Coach received telephone call from patient.  [employer] compliance verified. Per patient she is upset that she is having diarrhea. Per patient about 2-3 a day.  Per patient she does not have an appetite. Patient has had several episodes of diarrhea and incontinence. Patient is running out of diarrhea medication. Patient weight is 144 pounds. Patient expressing feeling of dying and hospice.  Plan: RN let patient allay feeling RN discussed with patient about ordering adult briefs from [facility] mail order catalog Patient will call Dr about refilling medication RN will send education material on high fiber diet RN explained to patient to talk with palliative care nurse and Dr about [place] [facility] [phone]

## 2017-05-31 NOTE — Telephone Encounter (Signed)
Called and left message to call her. Called back, she is still having diarrhea. Usually 3 a day. She needs refill on Lomotil. She is asking if Lomotil can be increased to 2 tablets instead of 1 or can she take it more often.

## 2017-05-31 NOTE — Telephone Encounter (Signed)
Called with below message. Verbalized understanding. 

## 2017-06-07 ENCOUNTER — Inpatient Hospital Stay (HOSPITAL_BASED_OUTPATIENT_CLINIC_OR_DEPARTMENT_OTHER): Payer: Medicare HMO | Admitting: Hematology and Oncology

## 2017-06-07 ENCOUNTER — Inpatient Hospital Stay: Payer: Medicare HMO

## 2017-06-07 ENCOUNTER — Telehealth: Payer: Self-pay | Admitting: *Deleted

## 2017-06-07 ENCOUNTER — Encounter: Payer: Self-pay | Admitting: Hematology and Oncology

## 2017-06-07 DIAGNOSIS — C3412 Malignant neoplasm of upper lobe, left bronchus or lung: Secondary | ICD-10-CM | POA: Diagnosis not present

## 2017-06-07 DIAGNOSIS — R531 Weakness: Secondary | ICD-10-CM

## 2017-06-07 DIAGNOSIS — R197 Diarrhea, unspecified: Secondary | ICD-10-CM

## 2017-06-07 DIAGNOSIS — C7800 Secondary malignant neoplasm of unspecified lung: Secondary | ICD-10-CM

## 2017-06-07 DIAGNOSIS — G893 Neoplasm related pain (acute) (chronic): Secondary | ICD-10-CM | POA: Diagnosis not present

## 2017-06-07 DIAGNOSIS — R432 Parageusia: Secondary | ICD-10-CM

## 2017-06-07 DIAGNOSIS — C7949 Secondary malignant neoplasm of other parts of nervous system: Secondary | ICD-10-CM

## 2017-06-07 DIAGNOSIS — Z7189 Other specified counseling: Secondary | ICD-10-CM

## 2017-06-07 DIAGNOSIS — C7931 Secondary malignant neoplasm of brain: Secondary | ICD-10-CM | POA: Diagnosis not present

## 2017-06-07 DIAGNOSIS — Z79899 Other long term (current) drug therapy: Secondary | ICD-10-CM

## 2017-06-07 DIAGNOSIS — Z7982 Long term (current) use of aspirin: Secondary | ICD-10-CM

## 2017-06-07 DIAGNOSIS — Z5111 Encounter for antineoplastic chemotherapy: Secondary | ICD-10-CM | POA: Diagnosis not present

## 2017-06-07 LAB — COMPREHENSIVE METABOLIC PANEL
ALT: 7 U/L (ref 0–55)
AST: 11 U/L (ref 5–34)
Albumin: 3 g/dL — ABNORMAL LOW (ref 3.5–5.0)
Alkaline Phosphatase: 110 U/L (ref 40–150)
Anion gap: 10 (ref 3–11)
BUN: 7 mg/dL (ref 7–26)
CHLORIDE: 106 mmol/L (ref 98–109)
CO2: 25 mmol/L (ref 22–29)
Calcium: 9.1 mg/dL (ref 8.4–10.4)
Creatinine, Ser: 0.93 mg/dL (ref 0.60–1.10)
GFR calc Af Amer: >60 mL/min (ref >60)
Glucose, Bld: 95 mg/dL (ref 70–140)
POTASSIUM: 3.4 mmol/L (ref 3.3–4.7)
Sodium: 141 mmol/L (ref 136–145)
Total Bilirubin: 0.6 mg/dL (ref 0.2–1.2)
Total Protein: 6.6 g/dL (ref 6.4–8.3)

## 2017-06-07 LAB — CBC WITH DIFFERENTIAL/PLATELET
BASOS ABS: 0 10*3/uL (ref 0.0–0.1)
Basophils Relative: 1 %
EOS PCT: 1 %
Eosinophils Absolute: 0 10*3/uL (ref 0.0–0.5)
HCT: 35.4 % (ref 34.8–46.6)
Hemoglobin: 10.9 g/dL — ABNORMAL LOW (ref 11.6–15.9)
LYMPHS ABS: 0.6 10*3/uL — AB (ref 0.9–3.3)
Lymphocytes Relative: 18 %
MCH: 24.1 pg — ABNORMAL LOW (ref 25.1–34.0)
MCHC: 30.8 g/dL — AB (ref 31.5–36.0)
MCV: 78.3 fL — ABNORMAL LOW (ref 79.5–101.0)
Monocytes Absolute: 0.6 10*3/uL (ref 0.1–0.9)
Monocytes Relative: 17 %
NEUTROS PCT: 63 %
Neutro Abs: 2.2 10*3/uL (ref 1.5–6.5)
PLATELETS: 200 10*3/uL (ref 145–400)
RBC: 4.52 MIL/uL (ref 3.70–5.45)
RDW: 20.9 % — AB (ref 11.2–16.1)
WBC: 3.5 10*3/uL — AB (ref 3.9–10.3)

## 2017-06-07 MED ORDER — SODIUM CHLORIDE 0.9 % IJ SOLN
10.0000 mL | INTRAMUSCULAR | Status: DC | PRN
  Administered 2017-06-07: 10 mL via INTRAVENOUS
  Filled 2017-06-07: qty 10

## 2017-06-07 NOTE — Patient Instructions (Signed)
Implanted Port Home Guide An implanted port is a type of central line that is placed under the skin. Central lines are used to provide IV access when treatment or nutrition needs to be given through a person's veins. Implanted ports are used for long-term IV access. An implanted port may be placed because:  You need IV medicine that would be irritating to the small veins in your hands or arms.  You need long-term IV medicines, such as antibiotics.  You need IV nutrition for a long period.  You need frequent blood draws for lab tests.  You need dialysis.  Implanted ports are usually placed in the chest area, but they can also be placed in the upper arm, the abdomen, or the leg. An implanted port has two main parts:  Reservoir. The reservoir is round and will appear as a small, raised area under your skin. The reservoir is the part where a needle is inserted to give medicines or draw blood.  Catheter. The catheter is a thin, flexible tube that extends from the reservoir. The catheter is placed into a large vein. Medicine that is inserted into the reservoir goes into the catheter and then into the vein.  How will I care for my incision site? Do not get the incision site wet. Bathe or shower as directed by your health care provider. How is my port accessed? Special steps must be taken to access the port:  Before the port is accessed, a numbing cream can be placed on the skin. This helps numb the skin over the port site.  Your health care provider uses a sterile technique to access the port. ? Your health care provider must put on a mask and sterile gloves. ? The skin over your port is cleaned carefully with an antiseptic and allowed to dry. ? The port is gently pinched between sterile gloves, and a needle is inserted into the port.  Only "non-coring" port needles should be used to access the port. Once the port is accessed, a blood return should be checked. This helps ensure that the port  is in the vein and is not clogged.  If your port needs to remain accessed for a constant infusion, a clear (transparent) bandage will be placed over the needle site. The bandage and needle will need to be changed every week, or as directed by your health care provider.  Keep the bandage covering the needle clean and dry. Do not get it wet. Follow your health care provider's instructions on how to take a shower or bath while the port is accessed.  If your port does not need to stay accessed, no bandage is needed over the port.  What is flushing? Flushing helps keep the port from getting clogged. Follow your health care provider's instructions on how and when to flush the port. Ports are usually flushed with saline solution or a medicine called heparin. The need for flushing will depend on how the port is used.  If the port is used for intermittent medicines or blood draws, the port will need to be flushed: ? After medicines have been given. ? After blood has been drawn. ? As part of routine maintenance.  If a constant infusion is running, the port may not need to be flushed.  How long will my port stay implanted? The port can stay in for as long as your health care provider thinks it is needed. When it is time for the port to come out, surgery will be   done to remove it. The procedure is similar to the one performed when the port was put in. When should I seek immediate medical care? When you have an implanted port, you should seek immediate medical care if:  You notice a bad smell coming from the incision site.  You have swelling, redness, or drainage at the incision site.  You have more swelling or pain at the port site or the surrounding area.  You have a fever that is not controlled with medicine.  This information is not intended to replace advice given to you by your health care provider. Make sure you discuss any questions you have with your health care provider. Document  Released: 05/04/2005 Document Revised: 10/10/2015 Document Reviewed: 01/09/2013 Elsevier Interactive Patient Education  2017 Elsevier Inc.  

## 2017-06-07 NOTE — Assessment & Plan Note (Signed)
The patient is frail and weak She has severe, uncontrollable diarrhea after each dose of chemo She cannot eat due to dysgeusia and feels weak overall The patient ultimately made informed decision not to pursue further chemotherapy She is not interested to pursue imaging study I recommend we focus on quality of life measures and she is in agreement to get enroll in hospice.  We will call hospice service regarding this.

## 2017-06-07 NOTE — Assessment & Plan Note (Signed)
We had extensive goals of care discussion on multiple occasions She confirmed CODE STATUS is DNR Previously, I have filled out the MOST form and gave that to the patient We discussed enrollment in hospice and she agreed with the plan of care I will cancel her future treatment and follow-up.

## 2017-06-07 NOTE — Assessment & Plan Note (Signed)
She has severe diarrhea despite maximum dose of Imodium and Lomotil. Subsequently, her symptoms eventually improved After much discussion, she is in agreement to stop chemotherapy

## 2017-06-07 NOTE — Telephone Encounter (Signed)
Referral called to [facility]

## 2017-06-07 NOTE — Assessment & Plan Note (Signed)
She has stable pain on tramadol.  She will continue the same to take as needed

## 2017-06-07 NOTE — Progress Notes (Signed)
[place] OFFICE PROGRESS NOTE  Patient Care Team: McDiarmid, Blane Ohara, MD as PCP - General (Family Medicine) Gaye Pollack, MD (Cardiothoracic Surgery) Dickie La, MD (Family Medicine) Heath Lark, MD as Consulting Physician (Hematology and Oncology) Pleasant, Eppie Gibson, RN as [place], [place], [place] (Optometry) Milus Banister, MD as Consulting Physician (Gastroenterology) Melida Quitter, MD as Consulting Physician (Otolaryngology)  SUMMARY OF ONCOLOGIC HISTORY: Oncology History   Colon cancer   Primary site: Colon and Rectum (Left)   Staging method: AJCC 7th Edition   Clinical: Stage I (T2, N0, M0) signed by Heath Lark, MD on 05/31/2013  2:39 PM   Pathologic: Stage I (T2, N0, cM0) signed by Heath Lark, MD on 05/31/2013  2:39 PM   Summary: Stage I (T2, N0, cM0) Lung cancer, EGFR/ALK negative, recurrence after initial resection to LN and brain   Primary site: Lung (Left)   Staging method: AJCC 7th Edition   Clinical: Stage IV (T1, N2, M1b) signed by Heath Lark, MD on 05/31/2013  2:26 PM   Pathologic: Stage IV (T1, N2, M1b) signed by Heath Lark, MD on 05/31/2013  2:26 PM   Summary: Stage IV (T1, N2, M1b)       Cancer of upper lobe of left lung, Adenocarcinoma   03/01/2007 Procedure    Colonoscopy revealed abnormalities and biopsy show high-grade dysplasia      04/01/2007 Surgery    She underwent sigmoid resection which showed T2 N0 colon cancer, and negative margins and all of 17 lymph nodes were negative      02/29/2008 Procedure    Repeat surveillance colonoscopy was negative.      06/16/2010 Surgery    She underwent left upper lobectomy we show well-differentiated adenocarcinoma of the lung, T1, N0, M0      03/18/2011 Procedure    Repeat colonoscopy show multiple polyps but there were benign      10/19/2011 Procedure    Biopsy of mediastinal lymph node came back positive for recurrence of lung cancer, EGFR and ALK  negative      11/16/2011 - 12/14/2011 Chemotherapy    She received concurrent chemoradiation therapy with weekly carboplatin and Taxol.      11/16/2011 - 12/29/2011 Radiation Therapy    She received radiation therapy with weekly chemotherapy      02/15/2012 Imaging    MR of the brain showed a new intracranial metastases. This was subsequently treated with stereotactic radiosurgery.      03/07/2012 - 04/12/2013 Chemotherapy    She received chemotherapy with maintainence Alimta every 3 weeks. Chemotherapy was discontinued due to profound fatigue      03/02/2013 Procedure    She had therapeutic ultrasound guidance thoracentesis for pleural effusion that came back negative for cancer      05/04/2013 Procedure    She had repeat ultrasound-guided thoracentesis again and cytology was negative      05/29/2013 Imaging    Repeat CT scan of the chest, abdomen and pelvis show no evidence of disease but persistent right-sided pleural effusion      06/02/2013 Surgery    The patient had placement of Pleurx catheter and subsequently underwent pleurodesis.      09/22/2013 Imaging    Repeat CT scan show no evidence of active disease. There are nonspecific lymphadenopathy and she is placed on observation.      03/23/2014 Imaging    Repeat CT scan of the chest, abdomen and pelvis show recurrence of cancer  with widespread bilateral pulmonary metastasis.      05/14/2014 Imaging    Imaging of the chest and brain were repeated due to delay of initiation of chemotherapy. Overall, chest CT scan show stable disease. MRI of the head was negative for recurrence      05/16/2014 - 07/18/2014 Chemotherapy     she completed 4 cycles of combination chemotherapy with carboplatin and Alimta      07/16/2014 Imaging     repeat CT scan of the chest, abdomen and pelvis show regression in the size of lung nodules.      08/29/2014 - 08/14/2015 Chemotherapy    She received maintenance Alimta      10/16/2014 Imaging      repeat CT scan of the chest, abdomen and pelvis show regression in the size of lung nodules.      01/29/2015 Imaging    Repeat CT scan showed stable disease      02/01/2015 Imaging    MRI brain is negative      04/30/2015 Imaging    CT scan of the chest, abdomen and pelvis showed stable disease      07/25/2015 Imaging    MRI brain showed no evidence of new disease      09/09/2015 Imaging    CT chest showed interval increase in size of large RIGHT upper lobe nodule is most consistent with lung cancer recurrence.      09/18/2015 - 03/26/2016 Chemotherapy    She started on Nivolumab      10/22/2015 Imaging    Screening mammogram showed mild abnormality      10/31/2015 Imaging    Diagnostic mammogram showed mild calcification at the right upper outer breast      11/27/2015 Imaging    Stable post treatment related changes of left lower lobectomy and radiation therapy redemonstrated, as above. No definite findings to suggest local recurrence of disease or metastatic disease on today's examination      01/24/2016 Imaging    MR brain showed continued stable appearance of two small treated brain metastases. No new or progressive metastatic disease to the brain.      04/15/2016 Imaging    Ct scan showed findings highly suspicious for recurrent tumor in the mediastinum surrounding the right mainstem bronchus and in the region of the azygoesophageal recess. Diffuse pulmonary metastatic disease with new and progressive nodules since the prior examination.      04/24/2016 - 12/18/2016 Chemotherapy    The patient had chemotherapy with gemzar and Carboplatin      04/30/2016 Imaging    Decreased contrast enhancement of the two treated metastases in the right frontal and left occipital lobes. No new lesions.      06/01/2016 Imaging    CXR showed numerous metastatic nodules have progressed compared to the most recent comparison chest x-ray of 07/15/2015. Majority of these were noted on the  04/15/2016 chest CT. No segmental infiltrate or pulmonary edema.      06/19/2016 Adverse Reaction    Treatment is placed on hold today due to neutropenia and the patient not feeling well      06/29/2016 Imaging    Interval decrease in size of metastatic pulmonary nodules. 2. Persistent but slightly improved right hilar/mediastinal disease. No progressive findings. 3. No findings for upper abdominal metastatic disease      10/01/2016 Imaging    Mild increase in size of 2.5 cm central right lower lobe pulmonary nodule, consistent with metastatic disease. Numerous other bilateral sub-cm pulmonary  nodules are stable.  Stable mild subcarinal and right cardiophrenic angle lymphadenopathy. Stable emphysema and right lung radiation changes. Stable hepatic steatosis. Aortic and coronary artery atherosclerosis.      11/06/2016 Imaging    MR brain: Stable treated RIGHT insula and LEFT occipital metastases.      12/30/2016 Imaging    1. Bilateral pulmonary nodules are minimally progressive from 10/01/2016. Dominant nodule in the superior segment right lower lobe is stable. 2. Aortic atherosclerosis (ICD10-170.0). Coronary artery calcification.      01/08/2017 - 01/08/2017 Chemotherapy    She received 1 dose of Abraxane, discontinued because of insurance coverage      02/23/2017 Imaging    Mild decrease in size of dominant pulmonary nodule in the central right lower lobe. Otherwise stable sub-cm bilateral pulmonary nodules and mild mediastinal lymphadenopathy. No new or progressive disease identified.  Stable right lung post radiation changes.  Aortic Atherosclerosis (ICD10-I70.0) and Emphysema (ICD10-J43.9).       Brain metastases treated with radiosurgery   02/26/2012 Initial Diagnosis    Brain metastases treated with radiosurgery       INTERVAL HISTORY: Please see below for problem oriented charting. She returns for further chemotherapy Unfortunately, we are not able to get CT scan  approved last month With each subsequent chemotherapy last year, she had severe uncontrolled diarrhea and loss of appetite. After very high doses of scheduled Imodium and Lomotil, her diarrhea subsequently stopped She is also taking tramadol as needed for musculoskeletal pain She denies recent infection She have chronic cough from COPD Overall, she is very weak and is contemplating the decision to discontinue chemotherapy and focus on quality of life and hospice  REVIEW OF SYSTEMS:   Constitutional: Denies fevers, chills or abnormal weight loss Eyes: Denies blurriness of vision Ears, nose, mouth, throat, and face: Denies mucositis or sore throat Respiratory: Denies cough, dyspnea or wheezes Cardiovascular: Denies palpitation, chest discomfort or lower extremity swelling Skin: Denies abnormal skin rashes Lymphatics: Denies new lymphadenopathy or easy bruising Neurological:Denies numbness, tingling or new weaknesses Behavioral/Psych: Mood is stable, no new changes  All other systems were reviewed with the patient and are negative.  I have reviewed the past medical history, past surgical history, social history and family history with the patient and they are unchanged from previous note.  ALLERGIES:  is allergic to adhesive [tape] and lisinopril.  MEDICATIONS:  Current Outpatient Medications  Medication Sig Dispense Refill  . aspirin EC 81 MG tablet Take 81 mg by mouth daily.    . diclofenac sodium (VOLTAREN) 1 % GEL Apply 2 g topically 4 (four) times daily as needed. (Patient taking differently: Apply 2 g topically 4 (four) times daily as needed (pain). ) 100 g 1  . diphenoxylate-atropine (LOMOTIL) 2.5-0.025 MG tablet Take 1 tablet by mouth 4 (four) times daily as needed for diarrhea or loose stools. 30 tablet 0  . HYDROmorphone (DILAUDID) 8 MG tablet Take 1 tablet (8 mg total) by mouth every 6 (six) hours as needed for severe pain. 60 tablet 0  . ipratropium (ATROVENT HFA) 17 MCG/ACT  inhaler Inhale 2 puffs into the lungs every 4 (four) hours as needed for wheezing (Cough). 3 Inhaler 3  . lidocaine-prilocaine (EMLA) cream Apply 1 application topically as needed. 30 g 6  . loratadine (CLARITIN) 10 MG tablet Take 10 mg by mouth daily.    Marland Kitchen LORazepam (ATIVAN) 0.5 MG tablet Take 1 tablet (0.5 mg total) by mouth every 8 (eight) hours as needed for anxiety. [place]  tablet 1  . traMADol (ULTRAM) 50 MG tablet Take 1 tablet (50 mg total) by mouth every 6 (six) hours as needed. 30 tablet 0  . umeclidinium-vilanterol (ANORO ELLIPTA) 62.5-25 MCG/INH AEPB Inhale 1 puff into the lungs daily. 3 each 3  . VENTOLIN HFA 108 (90 Base) MCG/ACT inhaler Inhale 2 puffs into the lungs every 4 (four) hours as needed for wheezing or shortness of breath. 3 Inhaler 3   No current facility-administered medications for this visit.    Facility-Administered Medications Ordered in Other Visits  Medication Dose Route Frequency Provider Last Rate Last Dose  . sodium chloride 0.9 % injection 10 mL  10 mL Intravenous PRN Alvy Bimler, Lonny Eisen, MD   10 mL at 09/18/15 1345  . sodium chloride 0.9 % injection 10 mL  10 mL Intravenous PRN Alvy Bimler, Nickcole Bralley, MD   10 mL at 04/24/16 1136  . sodium chloride 0.9 % injection 10 mL  10 mL Intravenous PRN Alvy Bimler, Atianna Haidar, MD   10 mL at 01/15/17 1322  . sodium chloride 0.9 % injection 10 mL  10 mL Intravenous PRN Alvy Bimler, Brandin Dilday, MD   10 mL at 04/19/17 1255    PHYSICAL EXAMINATION: ECOG PERFORMANCE STATUS: 2 - Symptomatic, <50% confined to bed  Vitals:   06/07/17 1109  BP: 129/75  Pulse: (!) 110  Resp: 18  Temp: 98.1 F (36.7 C)  SpO2: 98%   Filed Weights   06/07/17 1109  Weight: 146 lb 14.4 oz (66.6 kg)    GENERAL:alert, no distress and comfortable SKIN: skin color, texture, turgor are normal, no rashes or significant lesions EYES: normal, Conjunctiva are pink and non-injected, sclera clear OROPHARYNX:no exudate, no erythema and lips, buccal mucosa, and tongue normal  NECK: supple,  thyroid normal size, non-tender, without nodularity LYMPH:  no palpable lymphadenopathy in the cervical, axillary or inguinal LUNGS: clear to auscultation and percussion with normal breathing effort HEART: regular rate & rhythm and no murmurs and no lower extremity edema ABDOMEN:abdomen soft, non-tender and normal bowel sounds Musculoskeletal:no cyanosis of digits and no clubbing  NEURO: alert & oriented x 3 with fluent speech, no focal motor/sensory deficits  LABORATORY DATA:  I have reviewed the data as listed    Component Value Date/Time   NA 141 06/07/2017 0927   NA 139 05/17/2017 1121   K 3.4 06/07/2017 0927   K 3.4 (L) 05/17/2017 1121   CL 106 06/07/2017 0927   CL 102 10/24/2012 1001   CO2 25 06/07/2017 0927   CO2 26 05/17/2017 1121   GLUCOSE 95 06/07/2017 0927   GLUCOSE 95 05/17/2017 1121   GLUCOSE 111 (H) 10/24/2012 1001   BUN 7 06/07/2017 0927   BUN 10.3 05/17/2017 1121   CREATININE 0.93 06/07/2017 0927   CREATININE 1.0 05/17/2017 1121   CALCIUM 9.1 06/07/2017 0927   CALCIUM 9.1 05/17/2017 1121   PROT 6.6 06/07/2017 0927   PROT 6.6 05/17/2017 1121   ALBUMIN 3.0 (L) 06/07/2017 0927   ALBUMIN 3.1 (L) 05/17/2017 1121   AST 11 06/07/2017 0927   AST 10 05/17/2017 1121   ALT 7 06/07/2017 0927   ALT 6 05/17/2017 1121   ALKPHOS 110 06/07/2017 0927   ALKPHOS 126 05/17/2017 1121   BILITOT 0.6 06/07/2017 0927   BILITOT 0.63 05/17/2017 1121   GFRNONAA >60 06/07/2017 0927   GFRAA >60 06/07/2017 0927    No results found for: SPEP, UPEP  Lab Results  Component Value Date   WBC 3.5 (L) 06/07/2017   NEUTROABS 2.2 06/07/2017  HGB 10.9 (L) 06/07/2017   HCT 35.4 06/07/2017   MCV 78.3 (L) 06/07/2017   PLT 200 06/07/2017      Chemistry      Component Value Date/Time   NA 141 06/07/2017 0927   NA 139 05/17/2017 1121   K 3.4 06/07/2017 0927   K 3.4 (L) 05/17/2017 1121   CL 106 06/07/2017 0927   CL 102 10/24/2012 1001   CO2 25 06/07/2017 0927   CO2 26 05/17/2017  1121   BUN 7 06/07/2017 0927   BUN 10.3 05/17/2017 1121   CREATININE 0.93 06/07/2017 0927   CREATININE 1.0 05/17/2017 1121      Component Value Date/Time   CALCIUM 9.1 06/07/2017 0927   CALCIUM 9.1 05/17/2017 1121   ALKPHOS 110 06/07/2017 0927   ALKPHOS 126 05/17/2017 1121   AST 11 06/07/2017 0927   AST 10 05/17/2017 1121   ALT 7 06/07/2017 0927   ALT 6 05/17/2017 1121   BILITOT 0.6 06/07/2017 0927   BILITOT 0.63 05/17/2017 1121       ASSESSMENT & PLAN:  Cancer of upper lobe of left lung, Adenocarcinoma The patient is frail and weak She has severe, uncontrollable diarrhea after each dose of chemo She cannot eat due to dysgeusia and feels weak overall The patient ultimately made informed decision not to pursue further chemotherapy She is not interested to pursue imaging study I recommend we focus on quality of life measures and she is in agreement to get enroll in hospice.  We will call hospice service regarding this.  Diarrhea She has severe diarrhea despite maximum dose of Imodium and Lomotil. Subsequently, her symptoms eventually improved After much discussion, she is in agreement to stop chemotherapy  Cancer associated pain She has stable pain on tramadol.  She will continue the same to take as needed  Goals of care, counseling/discussion We had extensive goals of care discussion on multiple occasions She confirmed CODE STATUS is DNR Previously, I have filled out the MOST form and gave that to the patient We discussed enrollment in hospice and she agreed with the plan of care I will cancel her future treatment and follow-up.   No orders of the defined types were placed in this encounter.  All questions were answered. The patient knows to call the clinic with any problems, questions or concerns. No barriers to learning was detected. I spent 25 minutes counseling the patient face to face. The total time spent in the appointment was 30 minutes and more than 50% was on  counseling and review of test results     Heath Lark, MD 06/07/2017 4:05 PM

## 2017-06-08 ENCOUNTER — Telehealth: Payer: Self-pay

## 2017-06-08 NOTE — Telephone Encounter (Signed)
Butch Penny, a nurse with hospice called and left message. That the patient does not want to be admitted to hospice at this time. Palliative care is currently assisting.

## 2017-06-09 ENCOUNTER — Telehealth: Payer: Self-pay | Admitting: Hematology and Oncology

## 2017-06-09 NOTE — Telephone Encounter (Signed)
No new orders or visits per 1/21 los.

## 2017-06-10 ENCOUNTER — Telehealth: Payer: Self-pay | Admitting: *Deleted

## 2017-06-10 ENCOUNTER — Telehealth: Payer: Self-pay | Admitting: Radiation Therapy

## 2017-06-10 DIAGNOSIS — R0602 Shortness of breath: Secondary | ICD-10-CM | POA: Diagnosis not present

## 2017-06-10 NOTE — Telephone Encounter (Signed)
Lauren [name] is due for a follow-up brain MRI in March 2019. There are notes in Epic suggesting that she is transitioning to palliative/hospice care, so I called her to see if she is interested in future appointments. She said that she does not want future scans or visits but asked that I let Dr. Isidore Moos and the rest of the "gang" know how much she appreciates everything that was done for her. She still has the picture of them, and hopes to come by and see everyone again one day.       I will pass along this message and make a note in [place] that she is not interested in future appointments.    Mont Dutton R.T.(R)(T) Special Procedures Navigator

## 2017-06-10 NOTE — Telephone Encounter (Signed)
Hospice nurse called to say patient has decided to sign up with Hospice services

## 2017-06-14 ENCOUNTER — Other Ambulatory Visit: Payer: Self-pay

## 2017-06-14 ENCOUNTER — Ambulatory Visit: Payer: Self-pay

## 2017-06-15 MED FILL — SENNA PLUS 8.6-50 MG TAB: 8.6-50 | 30 days supply | Qty: 30 | Fill #0

## 2017-06-15 MED FILL — IPRAT-ALBUT 0.5-3(2.5) MG/3: 0.5-2.5 (3) | 5 days supply | Qty: 90 | Fill #0

## 2017-06-15 MED FILL — predniSONE 10 MG (21) TBPK: 10 | 6 days supply | Qty: 21 | Fill #0

## 2017-06-17 ENCOUNTER — Ambulatory Visit: Payer: Self-pay | Admitting: Family Medicine

## 2017-06-17 ENCOUNTER — Other Ambulatory Visit: Payer: Self-pay | Admitting: *Deleted

## 2017-06-17 NOTE — Patient Outreach (Signed)
[facility]) Care Management  06/17/2017  Lauren [name] 17-Nov-1950 [id number]   RN Health Coach telephone call to patient.  [facility] compliance verified. RN made patient aware of case closure. Patient has transferred into the Hospice program.  Plan: [place] will call patient periodic to give support RN sent closure letter to patient and physician RN sent [place] closure  [place] Management [phone]

## 2017-06-28 MED FILL — predniSONE 10 MG TABS: 10 | 30 days supply | Qty: 30 | Fill #0

## 2017-07-01 ENCOUNTER — Ambulatory Visit (INDEPENDENT_AMBULATORY_CARE_PROVIDER_SITE_OTHER): Admitting: Family Medicine

## 2017-07-01 ENCOUNTER — Other Ambulatory Visit: Payer: Self-pay

## 2017-07-01 ENCOUNTER — Encounter: Payer: Self-pay | Admitting: Family Medicine

## 2017-07-01 VITALS — BP 110/60 | HR 121 | Temp 98.0°F | Ht 64.0 in | Wt 138.6 lb

## 2017-07-01 DIAGNOSIS — R0781 Pleurodynia: Secondary | ICD-10-CM | POA: Diagnosis not present

## 2017-07-01 DIAGNOSIS — C3412 Malignant neoplasm of upper lobe, left bronchus or lung: Secondary | ICD-10-CM

## 2017-07-01 DIAGNOSIS — R0789 Other chest pain: Secondary | ICD-10-CM

## 2017-07-01 MED FILL — SM TUSSIN DM SYRUP: 100-10 | 4 days supply | Qty: 236 | Fill #0

## 2017-07-02 ENCOUNTER — Encounter: Payer: Self-pay | Admitting: Family Medicine

## 2017-07-02 NOTE — Progress Notes (Signed)
30 minutes face to face where spent in total with counseling. Counseling involved supportive counseling, discussion of change in care goals to comfort only and her enrollment with hospice.  Lauren [name] has multiple coping methods including writing, talking with friends, and prayer.   She is having left lateral rib cage pain that is worsened with coughing.  She has to hold her rib cage with pillows when she coughs to decrease the left lateral rib cage pain.   I prescribed / provided a rib belt for Lauren [name] to try at home to see if it can help stabilize her ribcage when she coughs with goal of decreasing pain of coughing.   Lauren [name] said she would call my office if if/when there are significant changes in her health.  She does not believe she will be able to come into our office any longer because the task is considerably taxing for her.   Lauren [name] has an equanimity and thankfulness that is exceptional in my world.  Grace incarnate.

## 2017-07-12 MED FILL — LORazepam 1 MG TABS: 1 | 10 days supply | Qty: 60 | Fill #0

## 2017-07-12 MED FILL — METHADONE HCL 5 MG TABS: 5 | 30 days supply | Qty: 90 | Fill #0

## 2017-07-12 MED FILL — SENNA PLUS 8.6-50 MG TAB: 8.6-50 | 30 days supply | Qty: 30 | Fill #1

## 2017-07-28 MED FILL — SM TUSSIN DM SYRUP: 100-10 | 4 days supply | Qty: 236 | Fill #1

## 2017-08-16 DEATH — deceased

## 2019-04-19 IMAGING — MR MR HEAD WO/W CM
11 series · 48 of 48 positions shown · IV contrast (multihance)
Comparison: Multiple priors, most recent 04/30/2016.

CLINICAL DATA: Metastatic lung cancer status post SRS treatment.

EXAM:
MRI HEAD WITHOUT AND WITH CONTRAST
TECHNIQUE: Multiplanar, multiecho pulse sequences of the brain and surrounding
structures were obtained without and with intravenous contrast.
CONTRAST:  15mL MULTIHANCE GADOBENATE DIMEGLUMINE 529 MG/ML IV SOLN

[Series 2: FLAIR · sagittal · 3.0mm · 0.75mm/px · 2 of 39 slices shown (1 of 2)]
[im 1/39]
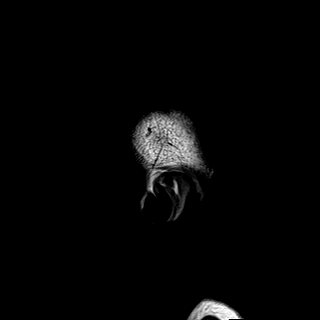
[im 39/39]
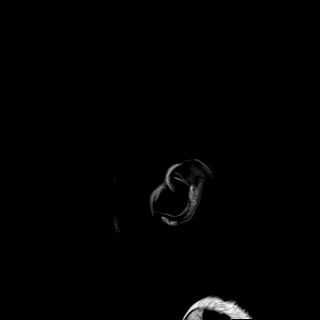

[Series 3: DWI · axial · 3.0mm · 1.50mm/px · z∈[-60,+88]mm · 5 of 78 slices shown (1 of 2)]
[im 1/78]
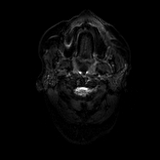
[im 20/78]
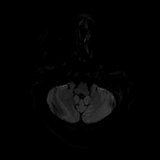
[im 39/78]
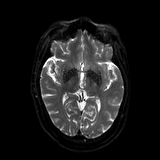
[im 58/78]
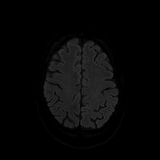
[im 78/78]
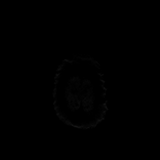

[Series 4: DWI · axial · 3.0mm · 1.50mm/px · z∈[-60,+88]mm · 3 of 39 slices shown (2 of 2)]
[im 1/39]
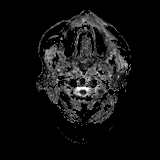
[im 20/39]
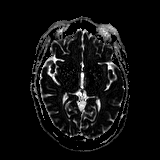
[im 39/39]
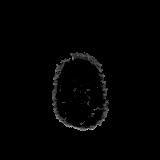

[Series 5: T2 · axial · 5.0mm · 0.60mm/px · z∈[-64,+92]mm · 2 of 27 slices shown]
[im 1/27]
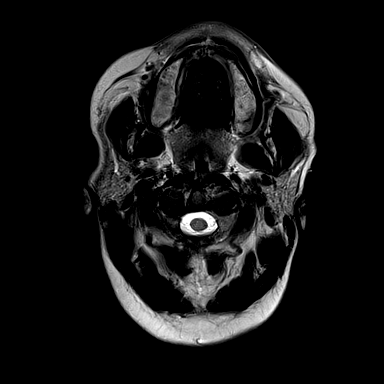
[im 27/27]
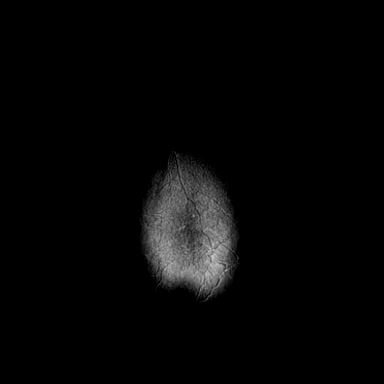

[Series 6: GRE · axial · 5.0mm · 0.57mm/px · z∈[-64,+91]mm · 2 of 27 slices shown]
[im 1/27]
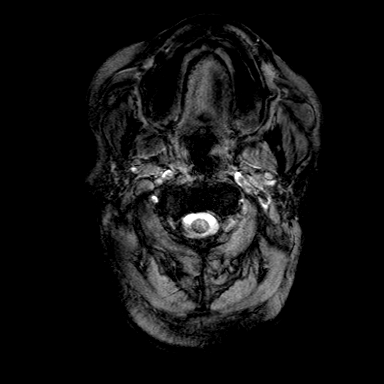
[im 27/27]
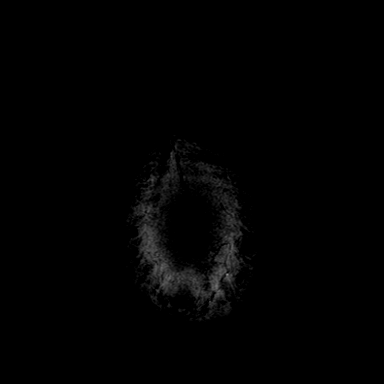

[Series 7: FLAIR · axial · 3.0mm · 0.60mm/px · z∈[-66,+87]mm · 3 of 52 slices shown (2 of 2)]
[im 1/52]
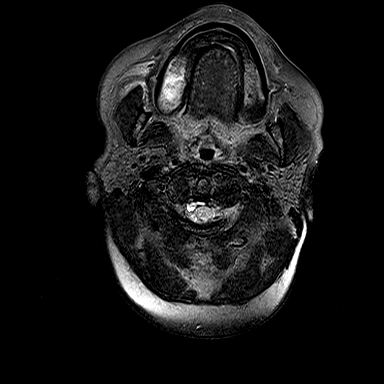
[im 26/52]
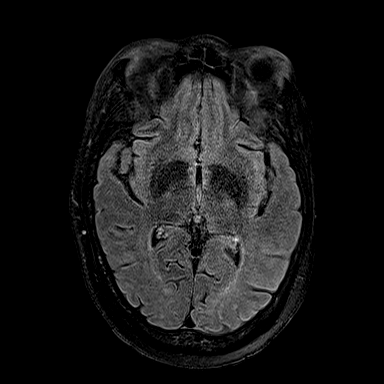
[im 52/52]
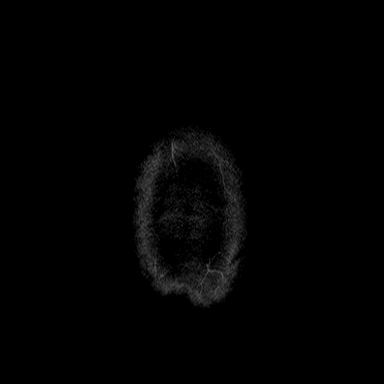

[Series 8: T1 · axial · 1.0mm · 0.75mm/px · z∈[-69,+90]mm · 11 of 160 slices shown]
[im 1/160]
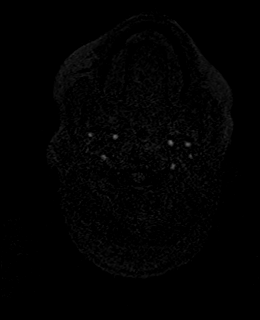
[im 16/160]
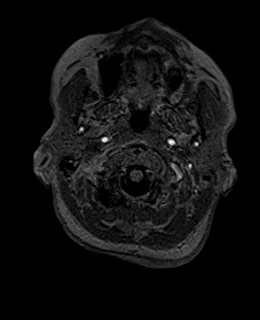
[im 32/160]
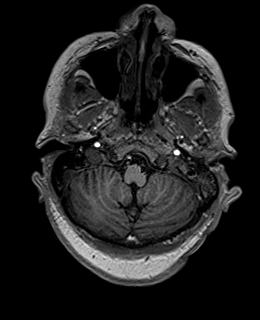
[im 48/160]
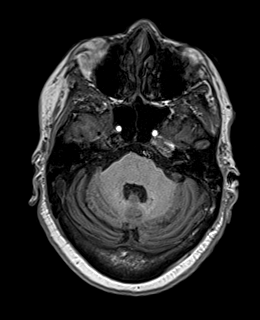
[im 64/160]
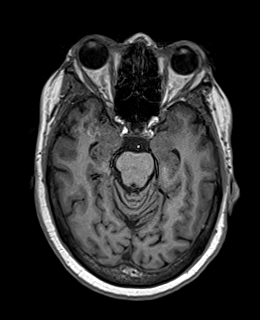
[im 80/160]
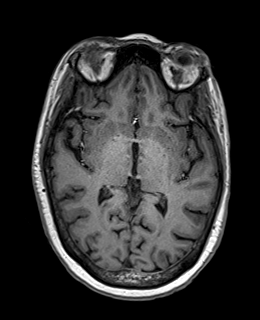
[im 96/160]
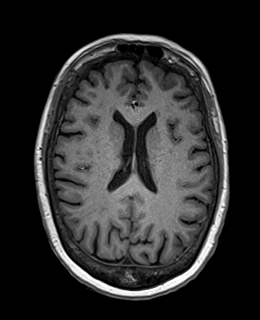
[im 112/160]
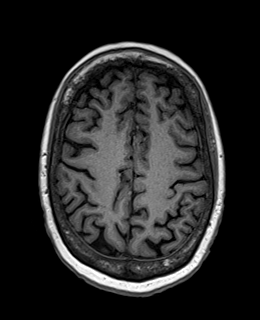
[im 128/160]
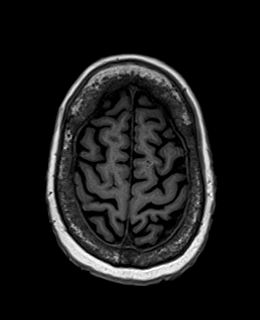
[im 144/160]
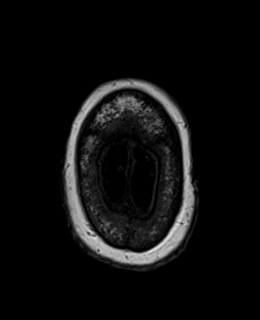
[im 160/160]
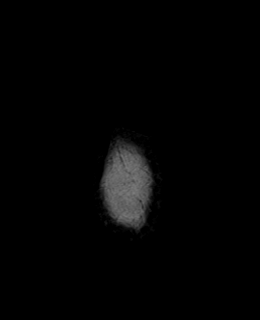

[Series 9: T2 post-contrast · coronal · 3.0mm · 0.57mm/px · 3 of 47 slices shown]
[im 1/47]
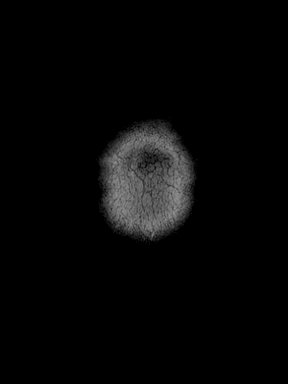
[im 24/47]
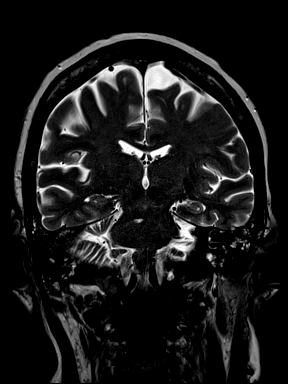
[im 47/47]
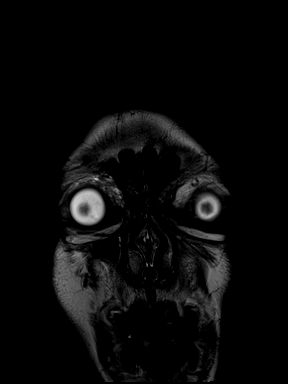

[Series 10: T1 post-contrast · axial · 1.0mm · 0.75mm/px · z∈[-69,+90]mm · 11 of 160 slices shown (1 of 2)]
[im 1/160]
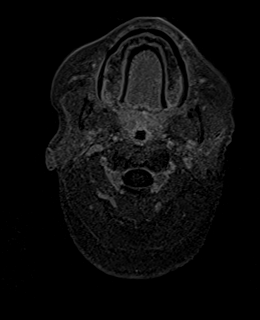
[im 16/160]
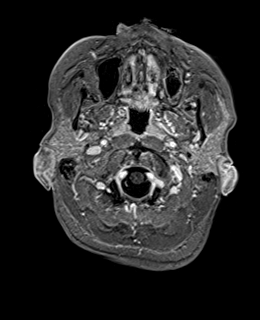
[im 32/160]
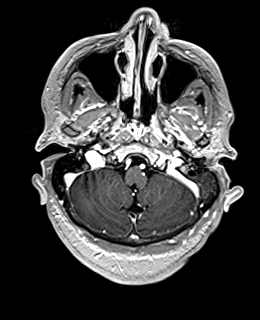
[im 48/160]
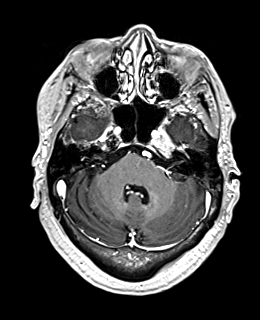
[im 64/160]
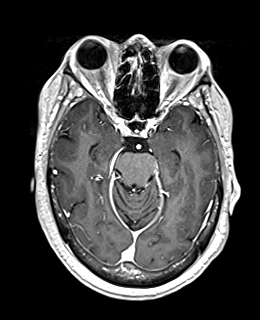
[im 80/160]
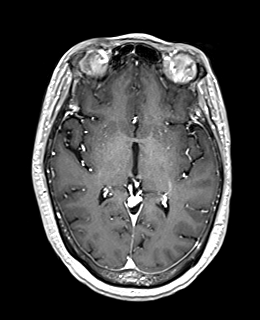
[im 96/160]
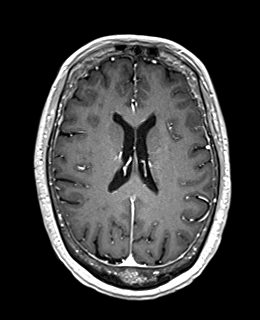
[im 112/160]
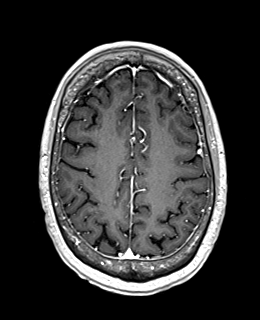
[im 128/160]
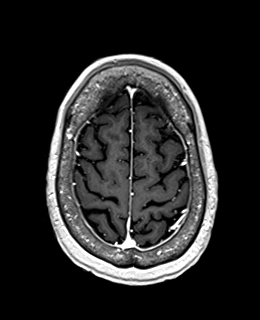
[im 144/160]
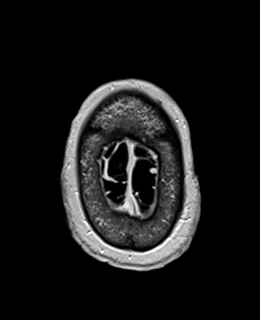
[im 160/160]
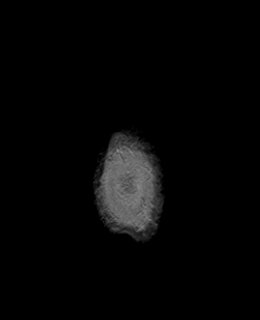

[Series 11: T1 post-contrast · coronal · 3.0mm · 0.57mm/px · 3 of 47 slices shown (2 of 2)]
[im 1/47]
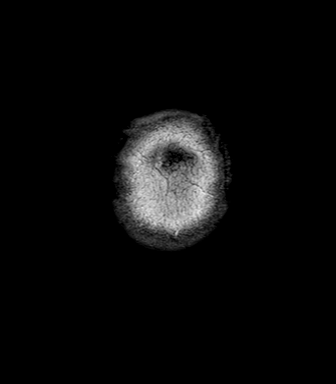
[im 24/47]
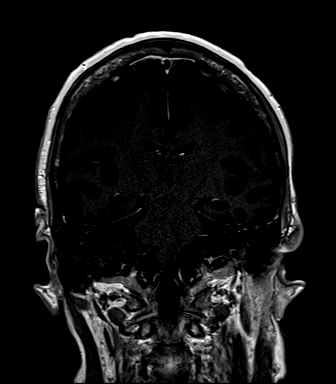
[im 47/47]
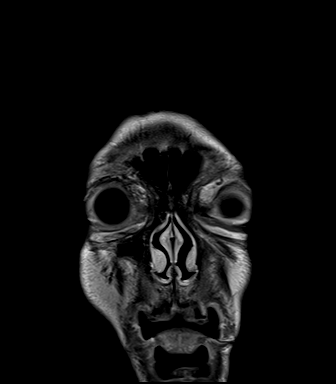

[Series 12: FLAIR post-contrast · sagittal · 3.0mm · 0.75mm/px · 3 of 39 slices shown]
[im 1/39]
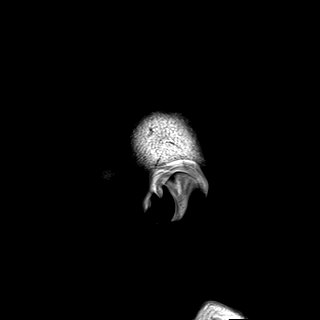
[im 20/39]
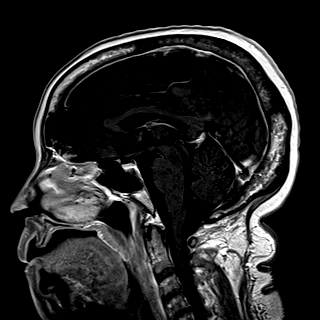
[im 39/39]
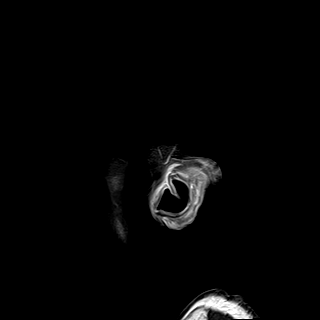

[48 of 48 positions shown; findings below may reference images not displayed]

FINDINGS: Brain: Stable appearance to the previously identified treated
metastases, one in the RIGHT insula measuring 2 mm, the other in the
LEFT occipital cortex measuring 2 x 3 mm.

No new lesions.

Mild atrophy with chronic microvascular ischemic change.

Vascular: Normal flow voids.

Skull and upper cervical spine: Unremarkable.

Sinuses/Orbits: No significant or layering fluid.  Negative orbits.

Other: BILATERAL LEFT greater than RIGHT mastoid effusions are
stable. No nasopharyngeal process is evident.
IMPRESSION: Stable treated RIGHT insula and LEFT occipital metastases.
